# Patient Record
Sex: Female | Born: 1970
Health system: Southern US, Community
[De-identification: ages and names within clinical notes are randomized; demographics above are authoritative.]

## PROBLEM LIST (undated history)

## (undated) DIAGNOSIS — I10 Essential (primary) hypertension: Secondary | ICD-10-CM

## (undated) DIAGNOSIS — Z9049 Acquired absence of other specified parts of digestive tract: Secondary | ICD-10-CM

## (undated) DIAGNOSIS — J45909 Unspecified asthma, uncomplicated: Secondary | ICD-10-CM

## (undated) DIAGNOSIS — D649 Anemia, unspecified: Secondary | ICD-10-CM

## (undated) DIAGNOSIS — J42 Unspecified chronic bronchitis: Secondary | ICD-10-CM

## (undated) DIAGNOSIS — N920 Excessive and frequent menstruation with regular cycle: Secondary | ICD-10-CM

## (undated) DIAGNOSIS — F319 Bipolar disorder, unspecified: Secondary | ICD-10-CM

## (undated) DIAGNOSIS — E119 Type 2 diabetes mellitus without complications: Secondary | ICD-10-CM

## (undated) DIAGNOSIS — E7800 Pure hypercholesterolemia, unspecified: Secondary | ICD-10-CM

## (undated) DIAGNOSIS — G4730 Sleep apnea, unspecified: Secondary | ICD-10-CM

## (undated) DIAGNOSIS — Z9289 Personal history of other medical treatment: Secondary | ICD-10-CM

## (undated) DIAGNOSIS — G589 Mononeuropathy, unspecified: Secondary | ICD-10-CM

## (undated) DIAGNOSIS — R079 Chest pain, unspecified: Secondary | ICD-10-CM

## (undated) DIAGNOSIS — F209 Schizophrenia, unspecified: Secondary | ICD-10-CM

## (undated) DIAGNOSIS — K219 Gastro-esophageal reflux disease without esophagitis: Secondary | ICD-10-CM

## (undated) DIAGNOSIS — G43909 Migraine, unspecified, not intractable, without status migrainosus: Secondary | ICD-10-CM

## (undated) DIAGNOSIS — F32A Depression, unspecified: Secondary | ICD-10-CM

## (undated) DIAGNOSIS — F329 Major depressive disorder, single episode, unspecified: Secondary | ICD-10-CM

## (undated) DIAGNOSIS — J189 Pneumonia, unspecified organism: Secondary | ICD-10-CM

## (undated) DIAGNOSIS — F419 Anxiety disorder, unspecified: Secondary | ICD-10-CM

## (undated) HISTORY — PX: FRACTURE SURGERY: SHX138

## (undated) HISTORY — DX: Chest pain, unspecified: R07.9

## (undated) HISTORY — PX: WISDOM TOOTH EXTRACTION: SHX21

## (undated) HISTORY — PX: TUBAL LIGATION: SHX77

---
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
## 2008-02-29 ENCOUNTER — Emergency Department (HOSPITAL_COMMUNITY): Admission: EM | Admit: 2008-02-29 | Discharge: 2008-02-29 | Payer: Self-pay | Admitting: Emergency Medicine

## 2008-03-18 ENCOUNTER — Emergency Department (HOSPITAL_COMMUNITY): Admission: EM | Admit: 2008-03-18 | Discharge: 2008-03-18 | Payer: Self-pay | Admitting: Emergency Medicine

## 2008-03-21 ENCOUNTER — Ambulatory Visit: Payer: Self-pay | Admitting: Internal Medicine

## 2008-03-21 DIAGNOSIS — F329 Major depressive disorder, single episode, unspecified: Secondary | ICD-10-CM

## 2008-03-21 DIAGNOSIS — J45909 Unspecified asthma, uncomplicated: Secondary | ICD-10-CM

## 2008-03-21 DIAGNOSIS — J42 Unspecified chronic bronchitis: Secondary | ICD-10-CM | POA: Insufficient documentation

## 2008-03-21 DIAGNOSIS — N920 Excessive and frequent menstruation with regular cycle: Secondary | ICD-10-CM

## 2008-03-21 DIAGNOSIS — E669 Obesity, unspecified: Secondary | ICD-10-CM | POA: Insufficient documentation

## 2008-03-21 DIAGNOSIS — E1169 Type 2 diabetes mellitus with other specified complication: Secondary | ICD-10-CM

## 2008-03-21 DIAGNOSIS — F411 Generalized anxiety disorder: Secondary | ICD-10-CM

## 2008-03-21 DIAGNOSIS — F3289 Other specified depressive episodes: Secondary | ICD-10-CM | POA: Insufficient documentation

## 2008-03-21 DIAGNOSIS — F319 Bipolar disorder, unspecified: Secondary | ICD-10-CM | POA: Insufficient documentation

## 2008-03-21 HISTORY — DX: Unspecified chronic bronchitis: J42

## 2008-03-21 HISTORY — DX: Excessive and frequent menstruation with regular cycle: N92.0

## 2008-03-21 HISTORY — DX: Generalized anxiety disorder: F41.1

## 2008-04-05 ENCOUNTER — Ambulatory Visit: Payer: Self-pay | Admitting: Internal Medicine

## 2008-04-05 DIAGNOSIS — G4733 Obstructive sleep apnea (adult) (pediatric): Secondary | ICD-10-CM | POA: Insufficient documentation

## 2008-04-05 DIAGNOSIS — G43909 Migraine, unspecified, not intractable, without status migrainosus: Secondary | ICD-10-CM | POA: Insufficient documentation

## 2008-04-06 ENCOUNTER — Emergency Department (HOSPITAL_COMMUNITY): Admission: EM | Admit: 2008-04-06 | Discharge: 2008-04-06 | Payer: Self-pay | Admitting: Emergency Medicine

## 2008-04-06 ENCOUNTER — Telehealth (INDEPENDENT_AMBULATORY_CARE_PROVIDER_SITE_OTHER): Payer: Self-pay | Admitting: *Deleted

## 2008-04-08 ENCOUNTER — Emergency Department (HOSPITAL_COMMUNITY): Admission: EM | Admit: 2008-04-08 | Discharge: 2008-04-08 | Payer: Self-pay | Admitting: Emergency Medicine

## 2008-04-15 ENCOUNTER — Emergency Department (HOSPITAL_COMMUNITY): Admission: EM | Admit: 2008-04-15 | Discharge: 2008-04-15 | Payer: Self-pay | Admitting: Emergency Medicine

## 2008-04-17 ENCOUNTER — Emergency Department (HOSPITAL_COMMUNITY): Admission: EM | Admit: 2008-04-17 | Discharge: 2008-04-17 | Payer: Self-pay | Admitting: Emergency Medicine

## 2008-04-18 ENCOUNTER — Ambulatory Visit: Payer: Self-pay | Admitting: Internal Medicine

## 2008-04-19 ENCOUNTER — Encounter (INDEPENDENT_AMBULATORY_CARE_PROVIDER_SITE_OTHER): Payer: Self-pay | Admitting: Internal Medicine

## 2008-04-20 ENCOUNTER — Encounter (INDEPENDENT_AMBULATORY_CARE_PROVIDER_SITE_OTHER): Payer: Self-pay | Admitting: Internal Medicine

## 2008-04-26 ENCOUNTER — Encounter (INDEPENDENT_AMBULATORY_CARE_PROVIDER_SITE_OTHER): Payer: Self-pay | Admitting: Internal Medicine

## 2008-05-03 ENCOUNTER — Encounter (INDEPENDENT_AMBULATORY_CARE_PROVIDER_SITE_OTHER): Payer: Self-pay | Admitting: Internal Medicine

## 2008-05-24 ENCOUNTER — Emergency Department (HOSPITAL_COMMUNITY): Admission: EM | Admit: 2008-05-24 | Discharge: 2008-05-24 | Payer: Self-pay | Admitting: Emergency Medicine

## 2008-05-25 ENCOUNTER — Emergency Department (HOSPITAL_COMMUNITY): Admission: EM | Admit: 2008-05-25 | Discharge: 2008-05-25 | Payer: Self-pay | Admitting: Emergency Medicine

## 2008-05-25 ENCOUNTER — Telehealth (INDEPENDENT_AMBULATORY_CARE_PROVIDER_SITE_OTHER): Payer: Self-pay | Admitting: Internal Medicine

## 2008-06-03 ENCOUNTER — Encounter (INDEPENDENT_AMBULATORY_CARE_PROVIDER_SITE_OTHER): Payer: Self-pay | Admitting: Internal Medicine

## 2008-06-10 ENCOUNTER — Emergency Department (HOSPITAL_COMMUNITY): Admission: EM | Admit: 2008-06-10 | Discharge: 2008-06-10 | Payer: Self-pay | Admitting: Emergency Medicine

## 2008-10-17 ENCOUNTER — Emergency Department (HOSPITAL_COMMUNITY): Admission: EM | Admit: 2008-10-17 | Discharge: 2008-10-17 | Payer: Self-pay | Admitting: Emergency Medicine

## 2008-11-06 ENCOUNTER — Emergency Department (HOSPITAL_COMMUNITY): Admission: EM | Admit: 2008-11-06 | Discharge: 2008-11-06 | Payer: Self-pay | Admitting: Emergency Medicine

## 2009-03-24 ENCOUNTER — Encounter (INDEPENDENT_AMBULATORY_CARE_PROVIDER_SITE_OTHER): Payer: Self-pay | Admitting: Internal Medicine

## 2009-03-28 ENCOUNTER — Encounter (INDEPENDENT_AMBULATORY_CARE_PROVIDER_SITE_OTHER): Payer: Self-pay | Admitting: Internal Medicine

## 2009-11-07 ENCOUNTER — Emergency Department (HOSPITAL_COMMUNITY): Admission: EM | Admit: 2009-11-07 | Discharge: 2009-11-07 | Payer: Self-pay | Admitting: Emergency Medicine

## 2009-11-09 ENCOUNTER — Observation Stay (HOSPITAL_COMMUNITY): Admission: EM | Admit: 2009-11-09 | Discharge: 2009-11-10 | Payer: Self-pay | Admitting: Emergency Medicine

## 2010-01-30 ENCOUNTER — Emergency Department (HOSPITAL_COMMUNITY): Admission: EM | Admit: 2010-01-30 | Discharge: 2010-01-30 | Payer: Self-pay | Admitting: Emergency Medicine

## 2010-01-30 ENCOUNTER — Other Ambulatory Visit: Payer: Self-pay | Admitting: Emergency Medicine

## 2010-01-31 ENCOUNTER — Inpatient Hospital Stay (HOSPITAL_COMMUNITY): Admission: EM | Admit: 2010-01-31 | Discharge: 2010-02-05 | Payer: Self-pay | Admitting: Psychiatry

## 2010-01-31 ENCOUNTER — Ambulatory Visit: Payer: Self-pay | Admitting: Psychiatry

## 2010-03-11 ENCOUNTER — Emergency Department (HOSPITAL_COMMUNITY): Admission: EM | Admit: 2010-03-11 | Discharge: 2010-03-11 | Payer: Self-pay | Admitting: Emergency Medicine

## 2010-04-10 ENCOUNTER — Emergency Department (HOSPITAL_COMMUNITY): Admission: EM | Admit: 2010-04-10 | Discharge: 2010-04-11 | Payer: Self-pay | Admitting: Emergency Medicine

## 2010-04-11 ENCOUNTER — Emergency Department (HOSPITAL_COMMUNITY): Admission: EM | Admit: 2010-04-11 | Discharge: 2010-04-12 | Payer: Self-pay | Admitting: Emergency Medicine

## 2010-04-19 ENCOUNTER — Emergency Department (HOSPITAL_COMMUNITY): Admission: EM | Admit: 2010-04-19 | Discharge: 2010-04-19 | Payer: Self-pay | Admitting: Emergency Medicine

## 2010-06-08 ENCOUNTER — Emergency Department (HOSPITAL_COMMUNITY): Admission: EM | Admit: 2010-06-08 | Discharge: 2010-06-08 | Payer: Self-pay | Admitting: Emergency Medicine

## 2010-07-02 ENCOUNTER — Emergency Department (HOSPITAL_COMMUNITY)
Admission: EM | Admit: 2010-07-02 | Discharge: 2010-07-02 | Payer: Self-pay | Source: Home / Self Care | Admitting: Emergency Medicine

## 2010-07-11 ENCOUNTER — Emergency Department (HOSPITAL_COMMUNITY)
Admission: EM | Admit: 2010-07-11 | Discharge: 2010-07-11 | Payer: Self-pay | Source: Home / Self Care | Admitting: Emergency Medicine

## 2010-07-15 ENCOUNTER — Emergency Department (HOSPITAL_COMMUNITY)
Admission: EM | Admit: 2010-07-15 | Discharge: 2010-07-15 | Payer: Self-pay | Source: Home / Self Care | Admitting: Emergency Medicine

## 2010-07-16 ENCOUNTER — Emergency Department (HOSPITAL_COMMUNITY)
Admission: EM | Admit: 2010-07-16 | Discharge: 2010-07-16 | Payer: Self-pay | Source: Home / Self Care | Admitting: Emergency Medicine

## 2010-07-25 ENCOUNTER — Emergency Department (HOSPITAL_COMMUNITY)
Admission: EM | Admit: 2010-07-25 | Discharge: 2010-07-25 | Payer: Self-pay | Source: Home / Self Care | Admitting: Emergency Medicine

## 2010-08-19 ENCOUNTER — Emergency Department (HOSPITAL_COMMUNITY)
Admission: EM | Admit: 2010-08-19 | Discharge: 2010-08-19 | Payer: Self-pay | Source: Home / Self Care | Admitting: Emergency Medicine

## 2010-08-21 LAB — URINALYSIS, ROUTINE W REFLEX MICROSCOPIC
Bilirubin Urine: NEGATIVE
Hgb urine dipstick: NEGATIVE
Ketones, ur: NEGATIVE mg/dL
Nitrite: NEGATIVE
Protein, ur: NEGATIVE mg/dL
Specific Gravity, Urine: 1.027 (ref 1.005–1.030)
Urobilinogen, UA: 0.2 mg/dL (ref 0.0–1.0)

## 2010-10-08 LAB — URINALYSIS, ROUTINE W REFLEX MICROSCOPIC
Glucose, UA: NEGATIVE mg/dL
Ketones, ur: NEGATIVE mg/dL
Urobilinogen, UA: 0.2 mg/dL (ref 0.0–1.0)
pH: 8 (ref 5.0–8.0)

## 2010-10-09 LAB — WET PREP, GENITAL

## 2010-10-09 LAB — URINALYSIS, ROUTINE W REFLEX MICROSCOPIC
Bilirubin Urine: NEGATIVE
Glucose, UA: NEGATIVE mg/dL
Hgb urine dipstick: NEGATIVE
Nitrite: NEGATIVE
pH: 5.5 (ref 5.0–8.0)

## 2010-10-09 LAB — POCT PREGNANCY, URINE: Preg Test, Ur: NEGATIVE

## 2010-10-11 LAB — DIFFERENTIAL
Basophils Absolute: 0 10*3/uL (ref 0.0–0.1)
Basophils Relative: 0 % (ref 0–1)
Eosinophils Absolute: 0 10*3/uL (ref 0.0–0.7)
Eosinophils Relative: 0 % (ref 0–5)
Monocytes Absolute: 0.2 10*3/uL (ref 0.1–1.0)

## 2010-10-11 LAB — URINALYSIS, ROUTINE W REFLEX MICROSCOPIC
Glucose, UA: NEGATIVE mg/dL
Leukocytes, UA: NEGATIVE
Nitrite: NEGATIVE
Protein, ur: 100 mg/dL — AB
Specific Gravity, Urine: 1.031 — ABNORMAL HIGH (ref 1.005–1.030)
Urobilinogen, UA: 0.2 mg/dL (ref 0.0–1.0)

## 2010-10-11 LAB — LIPASE, BLOOD: Lipase: 22 U/L (ref 11–59)

## 2010-10-11 LAB — RAPID URINE DRUG SCREEN, HOSP PERFORMED
Barbiturates: NOT DETECTED
Cocaine: NOT DETECTED
Opiates: NOT DETECTED
Tetrahydrocannabinol: NOT DETECTED

## 2010-10-11 LAB — COMPREHENSIVE METABOLIC PANEL
ALT: 13 U/L (ref 0–35)
Albumin: 4.2 g/dL (ref 3.5–5.2)
Alkaline Phosphatase: 37 U/L — ABNORMAL LOW (ref 39–117)
BUN: 12 mg/dL (ref 6–23)
CO2: 23 mEq/L (ref 19–32)
Calcium: 9.3 mg/dL (ref 8.4–10.5)
Glucose, Bld: 166 mg/dL — ABNORMAL HIGH (ref 70–99)
Potassium: 3.8 mEq/L (ref 3.5–5.1)
Total Protein: 7.8 g/dL (ref 6.0–8.3)

## 2010-10-11 LAB — CBC
HCT: 39.2 % (ref 36.0–46.0)
Hemoglobin: 13.3 g/dL (ref 12.0–15.0)
MCV: 82.1 fL (ref 78.0–100.0)
Platelets: 355 10*3/uL (ref 150–400)
RBC: 4.77 MIL/uL (ref 3.87–5.11)

## 2010-10-11 LAB — WET PREP, GENITAL
Clue Cells Wet Prep HPF POC: NONE SEEN
Trich, Wet Prep: NONE SEEN

## 2010-10-11 LAB — URINE MICROSCOPIC-ADD ON

## 2010-10-14 LAB — CBC
Hemoglobin: 11.5 g/dL — ABNORMAL LOW (ref 12.0–15.0)
MCH: 27.1 pg (ref 26.0–34.0)
RBC: 4.24 MIL/uL (ref 3.87–5.11)

## 2010-10-14 LAB — DIFFERENTIAL
Eosinophils Absolute: 0.1 10*3/uL (ref 0.0–0.7)
Lymphocytes Relative: 35 % (ref 12–46)
Lymphs Abs: 2.3 10*3/uL (ref 0.7–4.0)
Monocytes Relative: 5 % (ref 3–12)
Neutrophils Relative %: 58 % (ref 43–77)

## 2010-10-14 LAB — URINALYSIS, ROUTINE W REFLEX MICROSCOPIC
Bilirubin Urine: NEGATIVE
Specific Gravity, Urine: >1.03 — ABNORMAL HIGH (ref 1.005–1.030)
pH: 6 (ref 5.0–8.0)

## 2010-10-14 LAB — BASIC METABOLIC PANEL
CO2: 24 mEq/L (ref 19–32)
Calcium: 9.2 mg/dL (ref 8.4–10.5)
Chloride: 111 mEq/L (ref 96–112)
GFR calc Af Amer: >60 mL/min (ref >60)
Sodium: 142 mEq/L (ref 135–145)

## 2010-10-14 LAB — URINE MICROSCOPIC-ADD ON

## 2010-10-14 LAB — RAPID URINE DRUG SCREEN, HOSP PERFORMED
Amphetamines: NOT DETECTED
Barbiturates: NOT DETECTED

## 2010-10-16 LAB — BASIC METABOLIC PANEL
CO2: 18 mEq/L — ABNORMAL LOW (ref 19–32)
Calcium: 8 mg/dL — ABNORMAL LOW (ref 8.4–10.5)
Chloride: 120 mEq/L — ABNORMAL HIGH (ref 96–112)
Glucose, Bld: 125 mg/dL — ABNORMAL HIGH (ref 70–99)
Potassium: 3.3 mEq/L — ABNORMAL LOW (ref 3.5–5.1)
Sodium: 142 mEq/L (ref 135–145)

## 2010-10-17 LAB — CLOSTRIDIUM DIFFICILE EIA: C difficile Toxins A+B, EIA: NEGATIVE

## 2010-10-17 LAB — COMPREHENSIVE METABOLIC PANEL
AST: 24 U/L (ref 0–37)
Albumin: 4 g/dL (ref 3.5–5.2)
BUN: 4 mg/dL — ABNORMAL LOW (ref 6–23)
BUN: 8 mg/dL (ref 6–23)
Calcium: 9 mg/dL (ref 8.4–10.5)
Calcium: 9.3 mg/dL (ref 8.4–10.5)
Chloride: 112 mEq/L (ref 96–112)
Creatinine, Ser: 1.28 mg/dL — ABNORMAL HIGH (ref 0.4–1.2)
Creatinine, Ser: 1.7 mg/dL — ABNORMAL HIGH (ref 0.4–1.2)
GFR calc Af Amer: 40 mL/min — ABNORMAL LOW (ref >60)
GFR calc non Af Amer: 46 mL/min — ABNORMAL LOW (ref >60)
Glucose, Bld: 110 mg/dL — ABNORMAL HIGH (ref 70–99)
Total Bilirubin: 0.6 mg/dL (ref 0.3–1.2)
Total Protein: 7.3 g/dL (ref 6.0–8.3)
Total Protein: 7.9 g/dL (ref 6.0–8.3)

## 2010-10-17 LAB — DIFFERENTIAL
Basophils Absolute: 0 10*3/uL (ref 0.0–0.1)
Basophils Relative: 0 % (ref 0–1)
Eosinophils Relative: 1 % (ref 0–5)
Lymphocytes Relative: 13 % (ref 12–46)
Lymphs Abs: 0.9 10*3/uL (ref 0.7–4.0)
Lymphs Abs: 1.2 10*3/uL (ref 0.7–4.0)
Monocytes Absolute: 0.1 10*3/uL (ref 0.1–1.0)
Monocytes Relative: 2 % — ABNORMAL LOW (ref 3–12)
Monocytes Relative: 6 % (ref 3–12)
Neutro Abs: 5.6 10*3/uL (ref 1.7–7.7)
Neutro Abs: 8.4 10*3/uL — ABNORMAL HIGH (ref 1.7–7.7)
Neutrophils Relative %: 81 % — ABNORMAL HIGH (ref 43–77)

## 2010-10-17 LAB — TSH: TSH: 1.442 u[IU]/mL (ref 0.350–4.500)

## 2010-10-17 LAB — LIPASE, BLOOD: Lipase: 25 U/L (ref 11–59)

## 2010-10-17 LAB — URINALYSIS, ROUTINE W REFLEX MICROSCOPIC
Glucose, UA: NEGATIVE mg/dL
Glucose, UA: NEGATIVE mg/dL
Ketones, ur: NEGATIVE mg/dL
Leukocytes, UA: NEGATIVE
Leukocytes, UA: NEGATIVE
Nitrite: NEGATIVE
Nitrite: NEGATIVE
Specific Gravity, Urine: 1.03 (ref 1.005–1.030)
pH: 5 (ref 5.0–8.0)
pH: 5.5 (ref 5.0–8.0)

## 2010-10-17 LAB — CBC
HCT: 37.2 % (ref 36.0–46.0)
HCT: 40.5 % (ref 36.0–46.0)
Hemoglobin: 13.5 g/dL (ref 12.0–15.0)
MCHC: 33.4 g/dL (ref 30.0–36.0)
MCV: 82.6 fL (ref 78.0–100.0)
MCV: 83 fL (ref 78.0–100.0)
Platelets: 273 10*3/uL (ref 150–400)
RDW: 14.5 % (ref 11.5–15.5)
RDW: 14.7 % (ref 11.5–15.5)
WBC: 6.7 10*3/uL (ref 4.0–10.5)

## 2010-10-17 LAB — URINE MICROSCOPIC-ADD ON

## 2010-10-17 LAB — STOOL CULTURE

## 2010-10-17 LAB — RAPID URINE DRUG SCREEN, HOSP PERFORMED
Amphetamines: NOT DETECTED
Barbiturates: NOT DETECTED
Opiates: POSITIVE — AB

## 2011-04-26 LAB — URINALYSIS, ROUTINE W REFLEX MICROSCOPIC
Glucose, UA: NEGATIVE
Glucose, UA: NEGATIVE
Leukocytes, UA: NEGATIVE
Leukocytes, UA: NEGATIVE
Specific Gravity, Urine: >1.03 — ABNORMAL HIGH
pH: 5.5
pH: 5.5

## 2011-04-26 LAB — CBC
Hemoglobin: 12.2
MCHC: 32.7
MCHC: 32.8
Platelets: 317
RBC: 4.38
RDW: 13.9
WBC: 8.6

## 2011-04-26 LAB — URINE MICROSCOPIC-ADD ON

## 2011-04-26 LAB — COMPREHENSIVE METABOLIC PANEL
ALT: 12
Albumin: 4.2
Alkaline Phosphatase: 38 — ABNORMAL LOW
BUN: 10
Calcium: 9.3
Potassium: 3 — ABNORMAL LOW
Sodium: 141
Total Protein: 7.5

## 2011-04-26 LAB — DIFFERENTIAL
Basophils Relative: 0
Basophils Relative: 0
Lymphocytes Relative: 26
Lymphs Abs: 1.6
Lymphs Abs: 2.3
Monocytes Absolute: 0.5
Monocytes Absolute: 0.6
Monocytes Relative: 6
Monocytes Relative: 7
Neutro Abs: 5.8
Neutro Abs: 5.9
Neutrophils Relative %: 67
Neutrophils Relative %: 73

## 2011-04-26 LAB — PREGNANCY, URINE: Preg Test, Ur: NEGATIVE

## 2011-05-01 LAB — DIFFERENTIAL
Lymphs Abs: 1.5
Monocytes Absolute: 0.4
Monocytes Relative: 7
Neutro Abs: 4.5
Neutrophils Relative %: 69

## 2011-05-01 LAB — URINALYSIS, ROUTINE W REFLEX MICROSCOPIC
Glucose, UA: NEGATIVE
Hgb urine dipstick: NEGATIVE
Protein, ur: NEGATIVE
Specific Gravity, Urine: 1.02

## 2011-05-01 LAB — BASIC METABOLIC PANEL
Calcium: 9.1
Chloride: 112
Creatinine, Ser: 1.01
GFR calc Af Amer: >60
Sodium: 141

## 2011-05-01 LAB — CBC
Hemoglobin: 11.9 — ABNORMAL LOW
MCV: 84.8
RBC: 4.27
WBC: 6.6

## 2012-06-16 ENCOUNTER — Emergency Department (HOSPITAL_COMMUNITY)
Admission: EM | Admit: 2012-06-16 | Discharge: 2012-06-16 | Disposition: A | Discharge status: Home / Self Care | Payer: Self-pay | Attending: Emergency Medicine | Admitting: Emergency Medicine

## 2012-06-16 ENCOUNTER — Encounter (HOSPITAL_COMMUNITY): Payer: Self-pay | Admitting: Emergency Medicine

## 2012-06-16 DIAGNOSIS — R112 Nausea with vomiting, unspecified: Secondary | ICD-10-CM | POA: Insufficient documentation

## 2012-06-16 DIAGNOSIS — J45909 Unspecified asthma, uncomplicated: Secondary | ICD-10-CM | POA: Insufficient documentation

## 2012-06-16 DIAGNOSIS — G43909 Migraine, unspecified, not intractable, without status migrainosus: Secondary | ICD-10-CM | POA: Insufficient documentation

## 2012-06-16 DIAGNOSIS — Z8679 Personal history of other diseases of the circulatory system: Secondary | ICD-10-CM | POA: Insufficient documentation

## 2012-06-16 HISTORY — DX: Unspecified asthma, uncomplicated: J45.909

## 2012-06-16 HISTORY — DX: Migraine, unspecified, not intractable, without status migrainosus: G43.909

## 2012-06-16 MED ORDER — ONDANSETRON HCL 4 MG PO TABS: 4.0000 mg | ORAL_TABLET | Freq: Four times a day (QID) | ORAL | Status: DC

## 2012-06-16 MED ORDER — TRAMADOL HCL 50 MG PO TABS: 50.0000 mg | ORAL_TABLET | Freq: Four times a day (QID) | ORAL | Status: DC | PRN

## 2012-06-16 MED ORDER — KETOROLAC TROMETHAMINE 30 MG/ML IJ SOLN
30.0000 mg | Freq: Once | INTRAMUSCULAR | Status: AC
  Administered 2012-06-16: 30 mg via INTRAVENOUS
  Filled 2012-06-16: qty 1

## 2012-06-16 MED ORDER — METOCLOPRAMIDE HCL 5 MG/ML IJ SOLN
10.0000 mg | Freq: Once | INTRAMUSCULAR | Status: AC
  Administered 2012-06-16: 10 mg via INTRAVENOUS
  Filled 2012-06-16: qty 2

## 2012-06-16 MED ORDER — ONDANSETRON HCL 4 MG/2ML IJ SOLN
4.0000 mg | Freq: Once | INTRAMUSCULAR | Status: AC
  Administered 2012-06-16: 4 mg via INTRAVENOUS
  Filled 2012-06-16: qty 2

## 2012-06-16 MED ORDER — MORPHINE SULFATE 4 MG/ML IJ SOLN
4.0000 mg | Freq: Once | INTRAMUSCULAR | Status: AC
Start: 2012-06-16 — End: 2012-06-16
  Administered 2012-06-16: 4 mg via INTRAVENOUS
  Filled 2012-06-16: qty 1

## 2012-06-16 MED ORDER — DIPHENHYDRAMINE HCL 50 MG/ML IJ SOLN
25.0000 mg | Freq: Once | INTRAMUSCULAR | Status: AC
  Administered 2012-06-16: 25 mg via INTRAVENOUS
  Filled 2012-06-16: qty 1

## 2012-06-16 MED ORDER — MORPHINE SULFATE 4 MG/ML IJ SOLN
4.0000 mg | Freq: Once | INTRAMUSCULAR | Status: AC
  Administered 2012-06-16: 4 mg via INTRAVENOUS
  Filled 2012-06-16: qty 1

## 2012-06-16 MED ORDER — SODIUM CHLORIDE 0.9 % IV BOLUS (SEPSIS)
1000.0000 mL | Freq: Once | INTRAVENOUS | Status: AC
  Administered 2012-06-16: 1000 mL via INTRAVENOUS

## 2012-06-16 NOTE — ED Provider Notes (Signed)
History     CSN: [id number]  Arrival date & time 06/16/12  1453   First MD Initiated Contact with Patient 06/16/12 1534      Chief Complaint  Patient presents with  . Headache    (Consider location/radiation/quality/duration/timing/severity/associated sxs/prior treatment) HPI  As the emergency department with complaints of migraine lasting for 2 weeks. She says that she has a history of migraines the last time she was seen in the ER was over a month ago. She informs me that the headache cocktail does not work and that injections do not work either. She states that what works for her is IV morphine. Eyes having any weakness,, diarrhea, neck pain, change in vision. Any fevers or chest pains/SOB. nad vss Pt saw a "head specialist" years ago who gave her a "liquid preventative medication" but that was when she lived in another place and she has had no care for her migraines in a long time because she is on the waiting list for insurance.   Past Medical History  Diagnosis Date  . Migraines   . Asthma     No past surgical history on file.  No family history on file.  History  Substance Use Topics  . Smoking status: Never Smoker   . Smokeless tobacco: Not on file  . Alcohol Use: No    OB History    Grav Para Term Preterm Abortions TAB SAB Ect Mult Living                  Review of Systems  Review of Systems  Gen: no weight loss, fevers, chills, night sweats  Eyes: no discharge or drainage, no occular pain or visual changes  Nose: no epistaxis or rhinorrhea  Mouth: no dental pain, no sore throat  Neck: no neck pain  Lungs:No wheezing, coughing or hemoptysis CV: no chest pain, palpitations, dependent edema or orthopnea  Abd: no abdominal pain, nausea, vomiting  GU: no dysuria or gross hematuria  MSK:  No abnormalities  Neuro: + headache, no focal neurologic deficits  Skin: no abnormalities Psyche: negative.   Allergies  Review of patient's allergies indicates no  known allergies.  Home Medications   Current Outpatient Rx  Name  Route  Sig  Dispense  Refill  . CITALOPRAM HYDROBROMIDE 20 MG PO TABS   Oral   Take 20 mg by mouth daily.         Marland Kitchen HYDROXYZINE PAMOATE 50 MG PO CAPS   Oral   Take 50 mg by mouth 3 (three) times daily as needed. Anxiety         . ONDANSETRON HCL 4 MG PO TABS   Oral   Take 1 tablet (4 mg total) by mouth every 6 (six) hours.   12 tablet   0   . TRAMADOL HCL 50 MG PO TABS   Oral   Take 1 tablet (50 mg total) by mouth every 6 (six) hours as needed for pain.   15 tablet   0     BP 133/79  Pulse 100  Temp 98.6 F (37 C) (Oral)  Resp 18  SpO2 99%  LMP 06/10/2012  Physical Exam  Nursing note and vitals reviewed. Constitutional: She is oriented to person, place, and time. She appears well-developed and well-nourished. No distress.  HENT:  Head: Normocephalic and atraumatic.  Eyes: Pupils are equal, round, and reactive to light.  Neck: Normal range of motion. Neck supple.  Cardiovascular: Normal rate and regular rhythm.  Pulmonary/Chest: Effort normal.  Abdominal: Soft.  Neurological: She is alert and oriented to person, place, and time. She has normal strength. No cranial nerve deficit or sensory deficit. She displays a negative Romberg sign.  Skin: Skin is warm and dry.    ED Course  Procedures (including critical care time)  Labs Reviewed - No data to display No results found.   1. Migraine       MDM  Pt given Saline 1 L, benadryl, toradol, decadron. Her headache only partial resolved. 4mg  IV Morphine given and patients headache resolved to a 3/10. I more round of Morphine given and the patient had complete resolution of her pain. Will Rx for Ultram and give referral to Headache clinic or Neurologist.   Pt has been advised of the symptoms that warrant their return to the ED. Patient has voiced understanding and has agreed to follow-up with the PCP or specialist.     Linus Mako,  [place] 06/16/12 1810

## 2012-06-16 NOTE — ED Notes (Signed)
Patient given discharge instructions, information, prescriptions, and diet order. Patient states that they adequately understand discharge information given and to return to ED if symptoms return or worsen.     

## 2012-06-16 NOTE — ED Notes (Signed)
Pt c/o headache.  States she gets migraines and has had this one for 2 weeks.  Has had NV since last night.  States that the "headache cocktail" doesn't work and that she needs an IV and morphine.

## 2012-06-16 NOTE — ED Notes (Signed)
Pt sts she has chronic headaches with photophobia nausea and vomiting. Sts her headache began 2 weeks ago. Sts that she was prescribed percocet and vicodin last time she was seen here for her migraines, sts it helped her pain. Patient c/o left sided head pain that radiates to her eye.

## 2012-06-16 NOTE — ED Provider Notes (Signed)
Medical screening examination/treatment/procedure(s) were performed by non-physician practitioner and as supervising physician I was immediately available for consultation/collaboration.   Kathalene Frames, MD 06/16/12 [phone]

## 2012-06-17 ENCOUNTER — Encounter (HOSPITAL_COMMUNITY): Payer: Self-pay

## 2012-06-17 ENCOUNTER — Emergency Department (HOSPITAL_COMMUNITY)
Admission: EM | Admit: 2012-06-17 | Discharge: 2012-06-17 | Disposition: A | Discharge status: Home / Self Care | Payer: Self-pay | Attending: Emergency Medicine | Admitting: Emergency Medicine

## 2012-06-17 DIAGNOSIS — R51 Headache: Secondary | ICD-10-CM | POA: Insufficient documentation

## 2012-06-17 DIAGNOSIS — H53149 Visual discomfort, unspecified: Secondary | ICD-10-CM | POA: Insufficient documentation

## 2012-06-17 DIAGNOSIS — Z79899 Other long term (current) drug therapy: Secondary | ICD-10-CM | POA: Insufficient documentation

## 2012-06-17 DIAGNOSIS — R1110 Vomiting, unspecified: Secondary | ICD-10-CM | POA: Insufficient documentation

## 2012-06-17 DIAGNOSIS — J45909 Unspecified asthma, uncomplicated: Secondary | ICD-10-CM | POA: Insufficient documentation

## 2012-06-17 MED ORDER — SUMATRIPTAN SUCCINATE 6 MG/0.5ML ~~LOC~~ SOLN
6.0000 mg | Freq: Once | SUBCUTANEOUS | Status: AC
  Administered 2012-06-17: 6 mg via SUBCUTANEOUS
  Filled 2012-06-17: qty 0.5

## 2012-06-17 MED ORDER — VALPROATE SODIUM 500 MG/5ML IV SOLN
500.0000 mg | Freq: Once | INTRAVENOUS | Status: DC
  Filled 2012-06-17: qty 5

## 2012-06-17 MED ORDER — HYDROMORPHONE HCL PF 1 MG/ML IJ SOLN
1.0000 mg | Freq: Once | INTRAMUSCULAR | Status: AC
  Administered 2012-06-17: 1 mg via INTRAVENOUS
  Filled 2012-06-17: qty 1

## 2012-06-17 MED ORDER — METOCLOPRAMIDE HCL 5 MG/ML IJ SOLN
10.0000 mg | Freq: Once | INTRAMUSCULAR | Status: AC
  Administered 2012-06-17: 10 mg via INTRAVENOUS
  Filled 2012-06-17: qty 2

## 2012-06-17 MED ORDER — SODIUM CHLORIDE 0.9 % IV BOLUS (SEPSIS)
1000.0000 mL | Freq: Once | INTRAVENOUS | Status: AC
  Administered 2012-06-17: 1000 mL via INTRAVENOUS

## 2012-06-17 MED ORDER — DEXAMETHASONE SODIUM PHOSPHATE 10 MG/ML IJ SOLN
10.0000 mg | Freq: Once | INTRAMUSCULAR | Status: AC
  Administered 2012-06-17: 10 mg via INTRAVENOUS
  Filled 2012-06-17: qty 1

## 2012-06-17 MED ORDER — RIZATRIPTAN BENZOATE 10 MG PO TABS: 10.0000 mg | ORAL_TABLET | ORAL | Status: DC | PRN

## 2012-06-17 MED ORDER — PROMETHAZINE HCL 25 MG PO TABS: 25.0000 mg | ORAL_TABLET | Freq: Four times a day (QID) | ORAL | Status: DC | PRN

## 2012-06-17 MED ORDER — DIPHENHYDRAMINE HCL 50 MG/ML IJ SOLN
25.0000 mg | Freq: Once | INTRAMUSCULAR | Status: AC
  Administered 2012-06-17: 25 mg via INTRAVENOUS
  Filled 2012-06-17: qty 1

## 2012-06-17 NOTE — ED Notes (Signed)
Pt reports h/a is still a 10/10 but nausea is relieved.

## 2012-06-17 NOTE — ED Notes (Signed)
Pt ambulated to the BR with fiance's assist.  Pt with steady gait.  Pt c/o nausea and h/a.

## 2012-06-17 NOTE — ED Notes (Signed)
Patricia [name] EDPA notified re pt's memory loss per pt and her fiance.

## 2012-06-17 NOTE — ED Provider Notes (Signed)
History     CSN: [id number]  Arrival date & time 06/17/12  1042   First MD Initiated Contact with Patient 06/17/12 1235      Chief Complaint  Patient presents with  . Migraine  . Emesis    (Consider location/radiation/quality/duration/timing/severity/associated sxs/prior treatment) HPI Comments: Patricia [name] presents with her fiancee for evaluation of a headache.  She was seen here in the ER yesterday(last night) for the same headache.  She states the headache was almost completely resolved prior to leaving the ER but has since returned.  It is associated with nausea, blurred vision, and light sensitivity.  She denies any fever, neck pain, or respiratory complaints.  She describes the pain as being on the left side of her forehead and behind the left eye similar to previous headaches.  Patient is a 41 y.o. female presenting with migraines and vomiting. The history is provided by the patient. No language interpreter was used.  Migraine This is a recurrent problem. The current episode started 3 to 5 hours ago. The problem occurs constantly. The problem has been gradually worsening. Associated symptoms include headaches. Pertinent negatives include no chest pain, no abdominal pain and no shortness of breath. The symptoms are aggravated by bending and standing. Nothing relieves the symptoms.  Emesis  Associated symptoms include headaches. Pertinent negatives include no abdominal pain, no arthralgias, no chills, no cough, no diarrhea and no fever.    Past Medical History  Diagnosis Date  . Migraines   . Asthma     No past surgical history on file.  No family history on file.  History  Substance Use Topics  . Smoking status: Never Smoker   . Smokeless tobacco: Not on file  . Alcohol Use: No    OB History    Grav Para Term Preterm Abortions TAB SAB Ect Mult Living                  Review of Systems  Constitutional: Positive for appetite change. Negative for fever, chills,  diaphoresis, activity change and fatigue.  HENT: Negative for congestion, sore throat, facial swelling, rhinorrhea, drooling, trouble swallowing, neck pain, sinus pressure and tinnitus.   Eyes: Positive for photophobia. Negative for pain, discharge, redness, itching and visual disturbance.  Respiratory: Negative for cough, choking, chest tightness and shortness of breath.   Cardiovascular: Negative for chest pain and palpitations.  Gastrointestinal: Positive for vomiting. Negative for nausea, abdominal pain, diarrhea and constipation.  Genitourinary: Negative.   Musculoskeletal: Negative for arthralgias and gait problem.  Skin: Negative for color change, rash and wound.  Neurological: Positive for headaches. Negative for syncope, weakness and light-headedness.  Psychiatric/Behavioral: Negative for agitation.  All other systems reviewed and are negative.    Allergies  Review of patient's allergies indicates no known allergies.  Home Medications   Current Outpatient Rx  Name  Route  Sig  Dispense  Refill  . CITALOPRAM HYDROBROMIDE 20 MG PO TABS   Oral   Take 20 mg by mouth daily.         Marland Kitchen HYDROXYZINE PAMOATE 50 MG PO CAPS   Oral   Take 50 mg by mouth 3 (three) times daily as needed. Anxiety         . ONDANSETRON HCL 4 MG PO TABS   Oral   Take 1 tablet (4 mg total) by mouth every 6 (six) hours.   12 tablet   0   . TRAMADOL HCL 50 MG PO TABS   Oral  Take 1 tablet (50 mg total) by mouth every 6 (six) hours as needed for pain.   15 tablet   0     BP 141/87  Pulse 65  Temp 97.6 F (36.4 C) (Oral)  Resp 18  SpO2 100%  LMP 06/10/2012  Physical Exam  Nursing note and vitals reviewed. Constitutional: She is oriented to person, place, and time. She appears well-developed and well-nourished. No distress.  HENT:  Head: Normocephalic and atraumatic.  Right Ear: External ear normal.  Left Ear: External ear normal.  Nose: Nose normal.  Mouth/Throat: Oropharynx is  clear and moist. No oropharyngeal exudate.  Eyes: Conjunctivae normal are normal. Pupils are equal, round, and reactive to light. Right eye exhibits no discharge. Left eye exhibits no discharge. No scleral icterus.  Neck: Normal range of motion. Neck supple. No JVD present. No tracheal deviation present. No thyromegaly present.  Cardiovascular: Normal rate, regular rhythm, normal heart sounds and intact distal pulses.  Exam reveals no gallop and no friction rub.   No murmur heard. Pulmonary/Chest: Effort normal and breath sounds normal. No stridor. No respiratory distress. She has no wheezes. She has no rales. She exhibits no tenderness.  Abdominal: Soft. Bowel sounds are normal. She exhibits no distension and no mass. There is no tenderness. There is no rebound and no guarding.  Musculoskeletal: Normal range of motion. She exhibits no edema and no tenderness.  Lymphadenopathy:    She has no cervical adenopathy.  Neurological: She is alert and oriented to person, place, and time. No cranial nerve deficit.  Skin: Skin is warm and dry. No rash noted. She is not diaphoretic. No erythema. No pallor.  Psychiatric: She has a normal mood and affect. Her behavior is normal.    ED Course  Procedures (including critical care time)  Labs Reviewed - No data to display No results found.   No diagnosis found.    MDM  Pt presents for evaluation of a headache.  She was seen here in the ER last night for the same headache and reports the symptoms worsened prompting herto return. She describes along hx of migraine headaches.  She currently appears uncomfortable but nontoxic, note stable VS, NAD.  Plan symptomatic care as she reports this headache is consistent with previous headaches.  She reports having recent imaging at [facility].  Will reassess after medications.  1610.  Pt had no improvement of the headache after reglan, benadryl, and decadron.  Administered IVF, sumatriptan, and dilaudid.   She reports resolution of the headache.  Plan discharge home to follow-up as an outpt.  Pt advised to return if the headache worsens for likely admission and consult to neurology.      Perlie Mayo, MD 06/17/12 [phone]

## 2012-06-17 NOTE — ED Notes (Signed)
Per EMS pt here yesterday for same, didn't get her rx's filled. Pt c/o of same with vomiting, zofran given, IV 20g to lt forearm.

## 2012-06-21 ENCOUNTER — Emergency Department (HOSPITAL_COMMUNITY)
Admission: EM | Admit: 2012-06-21 | Discharge: 2012-06-21 | Disposition: A | Discharge status: Home / Self Care | Payer: Self-pay | Attending: Emergency Medicine | Admitting: Emergency Medicine

## 2012-06-21 ENCOUNTER — Emergency Department (HOSPITAL_COMMUNITY): Payer: Self-pay

## 2012-06-21 ENCOUNTER — Encounter (HOSPITAL_COMMUNITY): Payer: Self-pay | Admitting: Emergency Medicine

## 2012-06-21 DIAGNOSIS — J45909 Unspecified asthma, uncomplicated: Secondary | ICD-10-CM | POA: Insufficient documentation

## 2012-06-21 DIAGNOSIS — H53149 Visual discomfort, unspecified: Secondary | ICD-10-CM | POA: Insufficient documentation

## 2012-06-21 DIAGNOSIS — R51 Headache: Secondary | ICD-10-CM | POA: Insufficient documentation

## 2012-06-21 DIAGNOSIS — R5383 Other fatigue: Secondary | ICD-10-CM | POA: Insufficient documentation

## 2012-06-21 DIAGNOSIS — R112 Nausea with vomiting, unspecified: Secondary | ICD-10-CM | POA: Insufficient documentation

## 2012-06-21 DIAGNOSIS — G43909 Migraine, unspecified, not intractable, without status migrainosus: Secondary | ICD-10-CM | POA: Insufficient documentation

## 2012-06-21 DIAGNOSIS — Z79899 Other long term (current) drug therapy: Secondary | ICD-10-CM | POA: Insufficient documentation

## 2012-06-21 DIAGNOSIS — R5381 Other malaise: Secondary | ICD-10-CM | POA: Insufficient documentation

## 2012-06-21 MED ORDER — PROMETHAZINE HCL 25 MG/ML IJ SOLN
25.0000 mg | Freq: Once | INTRAMUSCULAR | Status: AC
  Administered 2012-06-21: 25 mg via INTRAVENOUS
  Filled 2012-06-21: qty 1

## 2012-06-21 MED ORDER — SODIUM CHLORIDE 0.9 % IV BOLUS (SEPSIS)
1000.0000 mL | Freq: Once | INTRAVENOUS | Status: AC
  Administered 2012-06-21: 1000 mL via INTRAVENOUS

## 2012-06-21 MED ORDER — ONDANSETRON HCL 4 MG PO TABS: 4.0000 mg | ORAL_TABLET | Freq: Four times a day (QID) | ORAL | Status: DC

## 2012-06-21 MED ORDER — KETOROLAC TROMETHAMINE 30 MG/ML IJ SOLN
30.0000 mg | Freq: Once | INTRAMUSCULAR | Status: AC
  Administered 2012-06-21: 30 mg via INTRAVENOUS
  Filled 2012-06-21: qty 1

## 2012-06-21 MED ORDER — ONDANSETRON HCL 4 MG/2ML IJ SOLN
4.0000 mg | Freq: Once | INTRAMUSCULAR | Status: AC
  Administered 2012-06-21: 4 mg via INTRAVENOUS
  Filled 2012-06-21: qty 2

## 2012-06-21 MED ORDER — OXYCODONE-ACETAMINOPHEN 5-325 MG PO TABS: 1.0000 | ORAL_TABLET | Freq: Four times a day (QID) | ORAL | Status: DC | PRN

## 2012-06-21 MED ORDER — HYDROMORPHONE HCL PF 1 MG/ML IJ SOLN
1.0000 mg | Freq: Once | INTRAMUSCULAR | Status: AC
  Administered 2012-06-21: 1 mg via INTRAVENOUS
  Filled 2012-06-21: qty 1

## 2012-06-21 MED ORDER — SUMATRIPTAN SUCCINATE 6 MG/0.5ML ~~LOC~~ SOLN
6.0000 mg | Freq: Once | SUBCUTANEOUS | Status: AC
  Administered 2012-06-21: 6 mg via SUBCUTANEOUS
  Filled 2012-06-21: qty 0.5

## 2012-06-21 MED ORDER — DEXAMETHASONE SODIUM PHOSPHATE 10 MG/ML IJ SOLN
10.0000 mg | Freq: Once | INTRAMUSCULAR | Status: AC
  Administered 2012-06-21: 10 mg via INTRAMUSCULAR
  Filled 2012-06-21: qty 1

## 2012-06-21 NOTE — ED Provider Notes (Signed)
History     CSN: [id number]  Arrival date & time 06/21/12  1520   First MD Initiated Contact with Patient 06/21/12 1554      Chief Complaint  Patient presents with  . Migraine    (Consider location/radiation/quality/duration/timing/severity/associated sxs/prior treatment) HPI Comments: 41 year old female with a history of migraines presents to the emergency department with worsening migraine after being seen in the emergency department on November 20. Her headache is located on the left side behind her eye and in the front of her forehead, described as sharp and constant, rated 10 out of 10. Nothing in specific makes her pain better. She had a CT scan at [facility] a month ago when she was living in [facility] which was normal. Her headaches went away for the past month until 2 days ago. Admits to associated nausea, vomiting, photophobia and spots in her fields of vision. Denies blurred vision or double vision. States she's beginning to feel weak. After leaving the emergency department on the 20th, she was better for a few hours until her headache returned. He family also tells her she gets confused whenever she has a severe headache. She is denying any confusion at this time.  The history is provided by the patient.    Past Medical History  Diagnosis Date  . Migraines   . Asthma     No past surgical history on file.  No family history on file.  History  Substance Use Topics  . Smoking status: Never Smoker   . Smokeless tobacco: Not on file  . Alcohol Use: No    OB History    Grav Para Term Preterm Abortions TAB SAB Ect Mult Living                  Review of Systems  Constitutional: Negative for fever and chills.  HENT: Negative for neck pain and neck stiffness.   Eyes: Positive for photophobia and visual disturbance.  Respiratory: Negative for shortness of breath.   Cardiovascular: Negative for chest pain.  Gastrointestinal: Positive for nausea and  vomiting.  Genitourinary: Negative.   Musculoskeletal: Negative for back pain.  Skin: Negative for rash.  Neurological: Positive for weakness and headaches. Negative for speech difficulty.  Psychiatric/Behavioral: Negative for confusion.    Allergies  Review of patient's allergies indicates no known allergies.  Home Medications   Current Outpatient Rx  Name  Route  Sig  Dispense  Refill  . CITALOPRAM HYDROBROMIDE 20 MG PO TABS   Oral   Take 20 mg by mouth daily.         Marland Kitchen HYDROXYZINE PAMOATE 50 MG PO CAPS   Oral   Take 50 mg by mouth 3 (three) times daily as needed. Anxiety         . ONDANSETRON HCL 4 MG PO TABS   Oral   Take 1 tablet (4 mg total) by mouth every 6 (six) hours.   12 tablet   0   . PROMETHAZINE HCL 25 MG PO TABS   Oral   Take 1 tablet (25 mg total) by mouth every 6 (six) hours as needed for nausea.   12 tablet   0   . RIZATRIPTAN BENZOATE 10 MG PO TABS   Oral   Take 1 tablet (10 mg total) by mouth as needed for migraine. May repeat in 2 hours if needed   10 tablet   0   . TRAMADOL HCL 50 MG PO TABS   Oral  Take 1 tablet (50 mg total) by mouth every 6 (six) hours as needed for pain.   15 tablet   0     BP 140/93  Pulse 72  Temp 97.6 F (36.4 C) (Oral)  Resp 24  SpO2 100%  LMP 06/10/2012  Physical Exam  Constitutional: She is oriented to person, place, and time. She appears well-developed. No distress.       Overweight, appears anxious, lights off in room.  HENT:  Head: Normocephalic and atraumatic.  Mouth/Throat: Oropharynx is clear and moist.  Eyes: Conjunctivae normal and EOM are normal. Pupils are equal, round, and reactive to light.  Neck: Normal range of motion. Neck supple.  Cardiovascular: Normal rate, regular rhythm, normal heart sounds and intact distal pulses.   Pulmonary/Chest: Effort normal and breath sounds normal. No respiratory distress.  Abdominal: Soft. Bowel sounds are normal. She exhibits no distension.    Musculoskeletal: Normal range of motion.  Neurological: She is alert and oriented to person, place, and time. A sensory deficit (decreased sensation to light touch on left) is present. Coordination and gait normal.       Strength left upper extrem 3/5, right upper extrem 4/5.   Skin: Skin is warm and dry.  Psychiatric: Her speech is normal and behavior is normal. Thought content normal. Her mood appears anxious. Cognition and memory are normal.       Tearful    ED Course  Procedures (including critical care time)  Labs Reviewed - No data to display Mr Brain Wo Contrast  06/21/2012  *RADIOLOGY REPORT*  Clinical Data: 41 year old female with headache, light and 2-D, vomiting.  MRI HEAD WITHOUT CONTRAST  Technique:  Multiplanar, multiecho pulse sequences of the brain and surrounding structures were obtained according to standard protocol without intravenous contrast.  Comparison: Head CT without contrast 09/11/2009.  Findings: Normal cerebral volume. No restricted diffusion to suggest acute infarction.  No midline shift, mass effect, evidence of mass lesion, ventriculomegaly, extra-axial collection or acute intracranial hemorrhage.  Cervicomedullary junction and pituitary are within normal limits.  Major intracranial vascular flow voids are preserved.  No cortical encephalomalacia.  Occasional small areas of subcortical increased T2 and FLAIR hyperintensity, the degree of which is within normal limits for age.  Otherwise normal gray and white matter signal throughout the brain.  Negative visualized cervical spine.  Dural calcifications again noted. Visualized bone marrow signal is within normal limits.  Visualized orbit soft tissues are within normal limits.  Visualized paranasal sinuses and mastoids are clear.  Small area of right vertex chronic scalp scarring.  Otherwise negative scalp soft tissues.  IMPRESSION: Normal for age noncontrast MRI appearance of the brain.   Original Report Authenticated  By: Roselyn Reef, M.D.      1. Migraine   2. Nausea & vomiting       MDM  41 y/o female with worsening migraine since being seen on 11/20. Slight left sided weakness noted on exam. Decreased sensation to light touch on left. Appears uncomfortable. She is nervous and tearful. CT scan obtained at [facility] was negative. Obtaining MRI brain. 7:07 PM MRI brain without any abnormality. Patient feeling better after receiving pain and nausea medication along with fluids. I explained the diagnosis and have given explicit precautions to return to the ER including fever, blurred vision, confusion, speech difficulty, difficulty walking or any other new or worsening symptoms. The patient understands and accepts the medical plan as it's been dictated and I have answered their questions.  Discharge instructions concerning home care and prescriptions have been given.  The patient is STABLE and is discharged to home in good condition. Vitals unremarkable. Advised neurology follow up for migraine control. Discharged with 10 percocet and zofran.        Illene Labrador, PA-C 06/21/12 [id number]

## 2012-06-21 NOTE — ED Notes (Signed)
Bedside report received from previous RN. Pt concerned about how she is going to get home. Lives in [place]. Has made arrangements to stay with a friend in [place]. Bus pass given.

## 2012-06-21 NOTE — ED Notes (Signed)
Patient transported to MRI with Rn

## 2012-06-21 NOTE — ED Notes (Signed)
Back from MRI.

## 2012-06-21 NOTE — ED Notes (Signed)
[id number] Expected date:06/21/12<BR> Expected time: 3:04 PM<BR> Means of arrival:Ambulance<BR> Comments:<BR> Headache, photophobia

## 2012-06-21 NOTE — ED Provider Notes (Signed)
Medical screening examination/treatment/procedure(s) were performed by non-physician practitioner and as supervising physician I was immediately available for consultation/collaboration. Rolland Porter, MD, Abr[facility] Sander   Janice Norrie, MD 06/21/12 [phone]

## 2012-06-21 NOTE — ED Notes (Signed)
Pt brought in by EMS for Migraines x 2wks unable to get her prescriptions filled  That were given to her this week for her Migraines  she stated" that they told me i can come back if pain gets worse"

## 2012-08-17 ENCOUNTER — Encounter (HOSPITAL_COMMUNITY): Payer: Self-pay | Admitting: *Deleted

## 2012-08-17 ENCOUNTER — Emergency Department (HOSPITAL_COMMUNITY)
Admission: EM | Admit: 2012-08-17 | Discharge: 2012-08-17 | Disposition: A | Discharge status: Home / Self Care | Payer: Self-pay | Attending: Emergency Medicine | Admitting: Emergency Medicine

## 2012-08-17 DIAGNOSIS — Z79899 Other long term (current) drug therapy: Secondary | ICD-10-CM | POA: Insufficient documentation

## 2012-08-17 DIAGNOSIS — R110 Nausea: Secondary | ICD-10-CM | POA: Insufficient documentation

## 2012-08-17 DIAGNOSIS — J45909 Unspecified asthma, uncomplicated: Secondary | ICD-10-CM | POA: Insufficient documentation

## 2012-08-17 DIAGNOSIS — G43909 Migraine, unspecified, not intractable, without status migrainosus: Secondary | ICD-10-CM | POA: Insufficient documentation

## 2012-08-17 MED ORDER — OXYCODONE-ACETAMINOPHEN 7.5-325 MG PO TABS: 1.0000 | ORAL_TABLET | ORAL | Status: DC | PRN

## 2012-08-17 MED ORDER — PROMETHAZINE HCL 25 MG/ML IJ SOLN
12.5000 mg | INTRAMUSCULAR | Status: DC | PRN
  Filled 2012-08-17: qty 1

## 2012-08-17 MED ORDER — KETOROLAC TROMETHAMINE 30 MG/ML IJ SOLN
30.0000 mg | Freq: Once | INTRAMUSCULAR | Status: AC
  Administered 2012-08-17: 30 mg via INTRAVENOUS
  Filled 2012-08-17: qty 1

## 2012-08-17 MED ORDER — DEXAMETHASONE SODIUM PHOSPHATE 4 MG/ML IJ SOLN
4.0000 mg | Freq: Once | INTRAMUSCULAR | Status: AC
  Administered 2012-08-17: 4 mg via INTRAVENOUS
  Filled 2012-08-17: qty 1

## 2012-08-17 MED ORDER — PROMETHAZINE HCL 25 MG/ML IJ SOLN
25.0000 mg | INTRAMUSCULAR | Status: DC | PRN
  Filled 2012-08-17: qty 1

## 2012-08-17 MED ORDER — FENTANYL CITRATE 0.05 MG/ML IJ SOLN
100.0000 ug | Freq: Once | INTRAMUSCULAR | Status: AC
  Administered 2012-08-17: 100 ug via INTRAVENOUS
  Filled 2012-08-17: qty 2

## 2012-08-17 MED ORDER — SODIUM CHLORIDE 0.9 % IV BOLUS (SEPSIS)
1000.0000 mL | Freq: Once | INTRAVENOUS | Status: AC
  Administered 2012-08-17: 1000 mL via INTRAVENOUS

## 2012-08-17 MED ORDER — HYDROMORPHONE HCL PF 1 MG/ML IJ SOLN
1.0000 mg | Freq: Once | INTRAMUSCULAR | Status: AC
  Administered 2012-08-17: 1 mg via INTRAVENOUS
  Filled 2012-08-17: qty 1

## 2012-08-17 NOTE — ED Notes (Signed)
Pt reports migraine x 1.5 weeks with nausea this am. Took imitrex without relief. States always has to have IV with medications.

## 2012-08-17 NOTE — Discharge Instructions (Signed)
Migraine Headache A migraine headache is very bad, throbbing pain on one or both sides of your head. Talk to your doctor about what things may bring on (trigger) your migraine headaches. HOME CARE  Only take medicines as told by your doctor.  Lie down in a dark, quiet room when you have a migraine.  Keep a journal to find out if certain things bring on migraine headaches. For example, write down:  What you eat and drink.  How much sleep you get.  Any change to your diet or medicines.  Lessen how much alcohol you drink.  Quit smoking if you smoke.  Get enough sleep.  Lessen any stress in your life.  Keep lights dim if bright lights bother you or make your migraines worse. GET HELP RIGHT AWAY IF:   Your migraine becomes really bad.  You have a fever.  You have a stiff neck.  You have trouble seeing.  Your muscles are weak, or you lose muscle control.  You lose your balance or have trouble walking.  You feel like you will pass out (faint), or you pass out.  You have really bad symptoms that are different than your first symptoms. MAKE SURE YOU:   Understand these instructions.  Will watch your condition.  Will get help right away if you are not doing well or get worse. Document Released: 04/23/2008 Document Revised: 10/07/2011 Document Reviewed: 07/05/2011 [facility] Patient Information 2013 [address].

## 2012-08-21 ENCOUNTER — Emergency Department (HOSPITAL_COMMUNITY)
Admission: EM | Admit: 2012-08-21 | Discharge: 2012-08-21 | Disposition: A | Discharge status: Home / Self Care | Payer: Self-pay | Attending: Emergency Medicine | Admitting: Emergency Medicine

## 2012-08-21 ENCOUNTER — Encounter (HOSPITAL_COMMUNITY): Payer: Self-pay | Admitting: *Deleted

## 2012-08-21 DIAGNOSIS — Z79899 Other long term (current) drug therapy: Secondary | ICD-10-CM | POA: Insufficient documentation

## 2012-08-21 DIAGNOSIS — J45909 Unspecified asthma, uncomplicated: Secondary | ICD-10-CM | POA: Insufficient documentation

## 2012-08-21 DIAGNOSIS — G43909 Migraine, unspecified, not intractable, without status migrainosus: Secondary | ICD-10-CM | POA: Insufficient documentation

## 2012-08-21 DIAGNOSIS — R112 Nausea with vomiting, unspecified: Secondary | ICD-10-CM | POA: Insufficient documentation

## 2012-08-21 MED ORDER — OXYCODONE-ACETAMINOPHEN 5-325 MG PO TABS: 2.0000 | ORAL_TABLET | ORAL | Status: DC | PRN

## 2012-08-21 MED ORDER — KETOROLAC TROMETHAMINE 30 MG/ML IJ SOLN
30.0000 mg | Freq: Once | INTRAMUSCULAR | Status: AC
  Administered 2012-08-21: 30 mg via INTRAVENOUS
  Filled 2012-08-21: qty 1

## 2012-08-21 MED ORDER — DIPHENHYDRAMINE HCL 50 MG/ML IJ SOLN
50.0000 mg | Freq: Once | INTRAMUSCULAR | Status: AC
  Administered 2012-08-21: 50 mg via INTRAVENOUS
  Filled 2012-08-21: qty 1

## 2012-08-21 MED ORDER — METOCLOPRAMIDE HCL 5 MG/ML IJ SOLN
10.0000 mg | INTRAMUSCULAR | Status: AC
  Administered 2012-08-21: 10 mg via INTRAVENOUS
  Filled 2012-08-21: qty 2

## 2012-08-21 MED ORDER — SODIUM CHLORIDE 0.9 % IV BOLUS (SEPSIS)
1000.0000 mL | Freq: Once | INTRAVENOUS | Status: AC
  Administered 2012-08-21: 1000 mL via INTRAVENOUS

## 2012-08-21 MED ORDER — PROMETHAZINE HCL 25 MG PO TABS: 25.0000 mg | ORAL_TABLET | Freq: Four times a day (QID) | ORAL | Status: DC | PRN

## 2012-08-21 NOTE — ED Provider Notes (Signed)
History     CSN: [id number]  Arrival date & time 08/21/12  1220   First MD Initiated Contact with Patient 08/21/12 1237      Chief Complaint  Patient presents with  . Migraine    (Consider location/radiation/quality/duration/timing/severity/associated sxs/prior treatment) HPI Comments: Patient is a 42 year old female with a past medical history of chronic migraines who presents with a headache for the past 3 days. Patient reports a gradual onset and progressive worsening of the headache. The pain is sharp, constant and is located in generalized head without radiation. Patient has tried imitrex and Percocet for symptoms without relief. No alleviating/aggravating factors. Patient reports associated nausea and photophobia. Patient denies fever, vomiting, diarrhea, numbness/tingling, weakness, visual changes, congestion, chest pain, SOB, abdominal pain.      Past Medical History  Diagnosis Date  . Migraines   . Asthma     History reviewed. No pertinent past surgical history.  History reviewed. No pertinent family history.  History  Substance Use Topics  . Smoking status: Never Smoker   . Smokeless tobacco: Not on file  . Alcohol Use: No    OB History    Grav Para Term Preterm Abortions TAB SAB Ect Mult Living                  Review of Systems  Gastrointestinal: Positive for nausea and vomiting.  Neurological: Positive for headaches.  All other systems reviewed and are negative.    Allergies  Review of patient's allergies indicates no known allergies.  Home Medications   Current Outpatient Rx  Name  Route  Sig  Dispense  Refill  . ALBUTEROL SULFATE HFA 108 (90 BASE) MCG/ACT IN AERS   Inhalation   Inhale 2 puffs into the lungs every 6 (six) hours as needed. For shortness of breath         . CITALOPRAM HYDROBROMIDE 20 MG PO TABS   Oral   Take 20 mg by mouth daily.         . OXYCODONE-ACETAMINOPHEN 7.5-325 MG PO TABS   Oral   Take 1 tablet by mouth  every 4 (four) hours as needed for pain.   15 tablet   0   . SUMATRIPTAN SUCCINATE 50 MG PO TABS   Oral   Take 50 mg by mouth every 2 (two) hours as needed. migraine           BP 144/98  Pulse 86  Temp 98.4 F (36.9 C) (Oral)  Resp 16  SpO2 100%  Physical Exam  Nursing note and vitals reviewed. Constitutional: She is oriented to person, place, and time. She appears well-developed and well-nourished. No distress.  HENT:  Head: Normocephalic and atraumatic.  Eyes: Conjunctivae normal and EOM are normal. Pupils are equal, round, and reactive to light. No scleral icterus.       photophobic  Neck: Normal range of motion. Neck supple.  Cardiovascular: Normal rate and regular rhythm.  Exam reveals no gallop and no friction rub.   No murmur heard. Pulmonary/Chest: Effort normal and breath sounds normal. She has no wheezes. She has no rales. She exhibits no tenderness.  Abdominal: Soft. She exhibits no distension. There is no tenderness. There is no rebound.  Musculoskeletal: Normal range of motion.  Neurological: She is alert and oriented to person, place, and time. Coordination normal.       Speech is goal-oriented. Moves limbs without ataxia.   Skin: Skin is warm and dry.  Psychiatric: She has a  normal mood and affect. Her behavior is normal.    ED Course  Procedures (including critical care time)  Labs Reviewed - No data to display No results found.   1. Migraine       MDM  1:28 PM Patient will have migraine cocktail.   2:58 PM Patient feeling better and is ready to go home. Patient instructed to follow up with her PCP for further evaluation and management. Patient given Percocet and Phenergan for symptoms. Vitals signs stable. Patient afebrile without meningeal signs. Patient instructed to return with worsening or concerning symptoms.       Alvina Chou, PA-C 08/21/12 1510

## 2012-08-21 NOTE — ED Provider Notes (Signed)
Medical screening examination/treatment/procedure(s) were performed by non-physician practitioner and as supervising physician I was immediately available for consultation/collaboration.   Orpah Greek, MD 08/21/12 [phone]

## 2012-08-21 NOTE — ED Notes (Signed)
Pt reports migraine and nausea x 2-3 days.  States that she needs an IV with something stronger than morphine for her pain.  Pt laughing, reports that she is hungry.  Pt is photophobic, states hx of migraines.

## 2012-08-25 ENCOUNTER — Emergency Department (HOSPITAL_COMMUNITY)
Admission: EM | Admit: 2012-08-25 | Discharge: 2012-08-25 | Disposition: A | Discharge status: Home / Self Care | Payer: Self-pay | Attending: Emergency Medicine | Admitting: Emergency Medicine

## 2012-08-25 ENCOUNTER — Encounter (HOSPITAL_COMMUNITY): Payer: Self-pay | Admitting: *Deleted

## 2012-08-25 DIAGNOSIS — Z79899 Other long term (current) drug therapy: Secondary | ICD-10-CM | POA: Insufficient documentation

## 2012-08-25 DIAGNOSIS — J45909 Unspecified asthma, uncomplicated: Secondary | ICD-10-CM | POA: Insufficient documentation

## 2012-08-25 DIAGNOSIS — R51 Headache: Secondary | ICD-10-CM

## 2012-08-25 DIAGNOSIS — G43909 Migraine, unspecified, not intractable, without status migrainosus: Secondary | ICD-10-CM | POA: Insufficient documentation

## 2012-08-25 DIAGNOSIS — Z7982 Long term (current) use of aspirin: Secondary | ICD-10-CM | POA: Insufficient documentation

## 2012-08-25 DIAGNOSIS — R112 Nausea with vomiting, unspecified: Secondary | ICD-10-CM | POA: Insufficient documentation

## 2012-08-25 DIAGNOSIS — R42 Dizziness and giddiness: Secondary | ICD-10-CM | POA: Insufficient documentation

## 2012-08-25 MED ORDER — DIPHENHYDRAMINE HCL 50 MG/ML IJ SOLN
25.0000 mg | Freq: Once | INTRAMUSCULAR | Status: AC
  Administered 2012-08-25: 25 mg via INTRAMUSCULAR
  Filled 2012-08-25: qty 1

## 2012-08-25 MED ORDER — IBUPROFEN 800 MG PO TABS: 800.0000 mg | ORAL_TABLET | Freq: Three times a day (TID) | ORAL | Status: DC

## 2012-08-25 MED ORDER — METOCLOPRAMIDE HCL 5 MG/ML IJ SOLN
10.0000 mg | Freq: Once | INTRAMUSCULAR | Status: AC
  Administered 2012-08-25: 10 mg via INTRAMUSCULAR
  Filled 2012-08-25: qty 2

## 2012-08-25 MED ORDER — KETOROLAC TROMETHAMINE 60 MG/2ML IM SOLN
60.0000 mg | Freq: Once | INTRAMUSCULAR | Status: AC
  Administered 2012-08-25: 60 mg via INTRAMUSCULAR
  Filled 2012-08-25: qty 2

## 2012-08-25 MED ORDER — HYDROMORPHONE HCL PF 1 MG/ML IJ SOLN
1.0000 mg | Freq: Once | INTRAMUSCULAR | Status: AC
  Administered 2012-08-25: 1 mg via INTRAMUSCULAR
  Filled 2012-08-25: qty 1

## 2012-08-25 NOTE — ED Notes (Signed)
Pt from home with reports of migraine, photophobia and N/V that started about a week and half ago. Pt reports being treated for same here with improvement for a couple days but symptoms returned.

## 2012-08-25 NOTE — Progress Notes (Signed)
given health reform information also

## 2012-08-25 NOTE — Progress Notes (Signed)
Pt noted with 6 ED visits in last 6 months CM consulted by ED patient advocate when pt states she needs information on how to get her medication Reports returning to [facility] from [place] spoke with pt who confirms self pay [facility] resident with no pcp. CM discussed and provided written information for self pay pcps, importance of pcp for f/u care, www.needymeds.org, discounted pharmacies, and other [place] resources such as financial assistance, DSS and  health department Reviewed Health connect number to assist with finding self pay provider close to pt's residence. Reviewed resources for [employer], general medical clinics, [facility] out patient pharmacies, housing, and other resources in [employer] Pt voiced understanding and appreciation of resources provided Pt states she has received medications from [facility] already CM discussed the [facility] program She confirms she has used [employer] $4 list

## 2012-08-25 NOTE — ED Notes (Signed)
MD at bedside. 

## 2012-08-25 NOTE — Progress Notes (Signed)
Stopped by to offer support. Patient was cold. Got permission to get her a blanket and gave her one. Presence; support.

## 2012-08-25 NOTE — ED Provider Notes (Signed)
History     CSN: [id number]  Arrival date & time 08/25/12  1416   First MD Initiated Contact with Patient 08/25/12 1512      Chief Complaint  Patient presents with  . Migraine  . Nausea  . Emesis    (Consider location/radiation/quality/duration/timing/severity/associated sxs/prior treatment) HPI Patricia [name] is a 42 y.o. female who presents with complaint of headache. States started a week ago. Hx hs of the same. Was diagnosed with migraines. States does not have a PCP at this time. States was seen here a week ago for the same. States was treated in ED, prescribed percocet. No pain while on percocet, once stopped, pain returned. States pain across forehead, admits to photophobia, nausea, vomiting x1. States tried Excedrin migraine and goodies with no relief. No numbness or weakness in extremities. No visual changes. No other complaints.    Past Medical History  Diagnosis Date  . Migraines   . Asthma     History reviewed. No pertinent past surgical history.  History reviewed. No pertinent family history.  History  Substance Use Topics  . Smoking status: Never Smoker   . Smokeless tobacco: Never Used  . Alcohol Use: Yes     Comment: occ    OB History    Grav Para Term Preterm Abortions TAB SAB Ect Mult Living                  Review of Systems  Constitutional: Negative for fever and chills.  HENT: Negative for neck pain and neck stiffness.   Eyes: Positive for photophobia. Negative for pain and visual disturbance.  Cardiovascular: Negative.   Gastrointestinal: Negative.   Musculoskeletal: Negative.   Neurological: Positive for dizziness and headaches. Negative for weakness and numbness.    Allergies  Review of patient's allergies indicates no known allergies.  Home Medications   Current Outpatient Rx  Name  Route  Sig  Dispense  Refill  . ALBUTEROL SULFATE HFA 108 (90 BASE) MCG/ACT IN AERS   Inhalation   Inhale 2 puffs into the lungs every 6 (six)  hours as needed. For shortness of breath         . ASPIRIN-ACETAMINOPHEN-CAFFEINE 250-250-65 MG PO TABS   Oral   Take 2 tablets by mouth every 6 (six) hours as needed. For pain.         Marland Kitchen CITALOPRAM HYDROBROMIDE 20 MG PO TABS   Oral   Take 20 mg by mouth daily.         Marland Kitchen PROMETHAZINE HCL 25 MG PO TABS   Oral   Take 1 tablet (25 mg total) by mouth every 6 (six) hours as needed for nausea.   12 tablet   0   . SUMATRIPTAN SUCCINATE 50 MG PO TABS   Oral   Take 50 mg by mouth every 2 (two) hours as needed. migraine           BP 129/93  Pulse 80  Temp 98.5 F (36.9 C) (Oral)  Resp 18  Wt 248 lb (112.492 kg)  SpO2 99%  LMP 08/03/2012  Physical Exam  Nursing note and vitals reviewed. Constitutional: She is oriented to person, place, and time. She appears well-developed and well-nourished. No distress.  HENT:  Head: Normocephalic and atraumatic.  Eyes: Conjunctivae normal and EOM are normal.       Pupils constricted, about 14mm, reactive  Neck: Normal range of motion. Neck supple.  Cardiovascular: Normal rate, regular rhythm and normal heart sounds.  Pulmonary/Chest: Effort normal and breath sounds normal. No respiratory distress. She has no wheezes. She has no rales.  Abdominal: Soft. Bowel sounds are normal. She exhibits no distension. There is no tenderness. There is no rebound.  Musculoskeletal: She exhibits no edema.  Neurological: She is alert and oriented to person, place, and time.       5/5 and equal upper and lower extremity strength bilaterally. Equal grip strength bilaterally. Normal finger to nose and heel to shin. No pronator drift. Patellar reflexes 2+   Skin: Skin is warm and dry.    ED Course  Procedures (including critical care time)  Pt with migraine headache, similar to prior. Has had prior work up for this, including CT and MRI, all normal. No pcp.   6:02 PM Pt continues to have pain after reglan, toradol, benadryl IM. Will try  dilaudid  6:39 PM Pt feeling better after Dilaudid IM. She is asking for percocet prescription. Will not fill this time. Looked up through controlled substance website, pt has been getting percoset form here and [facility]. Advised to take excedrin migraine. She is also non compliant with her cpap machine, instructed to wear it. Follow up with pcp.     1. Headache       MDM  Pt with typical for her migraine. Pain controlled in ED. Prior work ups and visit here for the same. No focal neuro deficits. Pt stable for d/c home with follow up. No red flags to suggest meningismus, SAH, or any other life threatening conditions.   Filed Vitals:   08/25/12 1503  BP: 129/93  Pulse: 80  Temp: 98.5 F (36.9 C)  Resp: [place], [place] 08/25/12 1852

## 2012-08-27 NOTE — ED Provider Notes (Signed)
Medical screening examination/treatment/procedure(s) were performed by non-physician practitioner and as supervising physician I was immediately available for consultation/collaboration.   Mirna Mires, MD 08/27/12 [phone]

## 2012-08-28 NOTE — ED Provider Notes (Signed)
History     CSN: [id number]  Arrival date & time 08/17/12  1418   First MD Initiated Contact with Patient 08/17/12 1843      Chief Complaint  Patient presents with  . Migraine   HPI Pt reports migraine x 1.5 weeks with nausea this am. Took imitrex without relief. States always has to have IV with medications.  Past Medical History  Diagnosis Date  . Migraines   . Asthma     History reviewed. No pertinent past surgical history.  No family history on file.  History  Substance Use Topics  . Smoking status: Never Smoker   . Smokeless tobacco: Never Used  . Alcohol Use: Yes     Comment: occ    OB History    Grav Para Term Preterm Abortions TAB SAB Ect Mult Living                  Review of Systems  All other systems reviewed and are negative.    Allergies  Review of patient's allergies indicates no known allergies.  Home Medications   Current Outpatient Rx  Name  Route  Sig  Dispense  Refill  . CITALOPRAM HYDROBROMIDE 20 MG PO TABS   Oral   Take 20 mg by mouth daily.         . SUMATRIPTAN SUCCINATE 50 MG PO TABS   Oral   Take 50 mg by mouth every 2 (two) hours as needed. migraine         . ALBUTEROL SULFATE HFA 108 (90 BASE) MCG/ACT IN AERS   Inhalation   Inhale 2 puffs into the lungs every 6 (six) hours as needed. For shortness of breath         . ASPIRIN-ACETAMINOPHEN-CAFFEINE 250-250-65 MG PO TABS   Oral   Take 2 tablets by mouth every 6 (six) hours as needed. For pain.         . IBUPROFEN 800 MG PO TABS   Oral   Take 1 tablet (800 mg total) by mouth 3 (three) times daily.   21 tablet   0   . PROMETHAZINE HCL 25 MG PO TABS   Oral   Take 1 tablet (25 mg total) by mouth every 6 (six) hours as needed for nausea.   12 tablet   0     BP 138/73  Pulse 83  Temp 98.5 F (36.9 C) (Oral)  Resp 16  SpO2 99%  Physical Exam  Nursing note and vitals reviewed. Constitutional: She is oriented to person, place, and time. She appears  well-developed and well-nourished. No distress.  HENT:  Head: Normocephalic and atraumatic.  Eyes: Pupils are equal, round, and reactive to light.  Neck: Normal range of motion.  Cardiovascular: Normal rate and intact distal pulses.   Pulmonary/Chest: No respiratory distress.  Abdominal: Normal appearance. She exhibits no distension.  Musculoskeletal: Normal range of motion.  Neurological: She is alert and oriented to person, place, and time. She has normal strength. No cranial nerve deficit or sensory deficit. She displays a negative Romberg sign. GCS eye subscore is 4. GCS verbal subscore is 5. GCS motor subscore is 6.  Skin: Skin is warm and dry. No rash noted.  Psychiatric: She has a normal mood and affect. Her behavior is normal.    ED Course  Procedures (including critical care time)  Labs Reviewed - No data to display No results found.   1. MIGRAINE HEADACHE       MDM  After treatment in the ED the patient feels back to baseline and wants to go home.         Dot Lanes, MD 08/28/12 [phone]

## 2012-09-21 ENCOUNTER — Encounter (HOSPITAL_COMMUNITY): Payer: Self-pay | Admitting: Emergency Medicine

## 2012-09-21 ENCOUNTER — Emergency Department (HOSPITAL_COMMUNITY)
Admission: EM | Admit: 2012-09-21 | Discharge: 2012-09-21 | Disposition: A | Discharge status: Home / Self Care | Payer: Self-pay | Attending: Emergency Medicine | Admitting: Emergency Medicine

## 2012-09-21 DIAGNOSIS — Z5321 Procedure and treatment not carried out due to patient leaving prior to being seen by health care provider: Secondary | ICD-10-CM

## 2012-09-21 DIAGNOSIS — J45909 Unspecified asthma, uncomplicated: Secondary | ICD-10-CM | POA: Insufficient documentation

## 2012-09-21 DIAGNOSIS — M545 Low back pain, unspecified: Secondary | ICD-10-CM | POA: Insufficient documentation

## 2012-09-21 DIAGNOSIS — G43909 Migraine, unspecified, not intractable, without status migrainosus: Secondary | ICD-10-CM | POA: Insufficient documentation

## 2012-09-21 DIAGNOSIS — R1110 Vomiting, unspecified: Secondary | ICD-10-CM | POA: Insufficient documentation

## 2012-09-21 DIAGNOSIS — Z79899 Other long term (current) drug therapy: Secondary | ICD-10-CM | POA: Insufficient documentation

## 2012-09-21 NOTE — ED Notes (Signed)
Pt reports migraine which began about 4 days ago. States she has had intermittent vomiting and sensitivty to light. States pain is similar to previous headaches. Also c/o low back pain. Denies recent injury, denies loss of bowel or bladder.

## 2012-09-21 NOTE — ED Notes (Signed)
No response.  Pt to be discharged.

## 2012-09-21 NOTE — ED Notes (Signed)
Pt cannot be located in waiting room.

## 2012-09-21 NOTE — ED Provider Notes (Signed)
  Medical screening examination/treatment/procedure(s) were performed by non-physician practitioner and as supervising physician I was immediately available for consultation/collaboration.    Carmin Muskrat, MD 09/21/12 [phone]

## 2012-09-21 NOTE — ED Notes (Signed)
Pt did not respond to take back to room

## 2012-09-21 NOTE — ED Provider Notes (Signed)
History     CSN: [id number]  Arrival date & time 09/21/12  1322   First MD Initiated Contact with Patient 09/21/12 1625      Chief Complaint  Patient presents with  . Migraine    (Consider location/radiation/quality/duration/timing/severity/associated sxs/prior treatment) HPI  Past Medical History  Diagnosis Date  . Migraines   . Asthma     No past surgical history on file.  No family history on file.  History  Substance Use Topics  . Smoking status: Never Smoker   . Smokeless tobacco: Never Used  . Alcohol Use: Yes     Comment: occ    OB History   Grav Para Term Preterm Abortions TAB SAB Ect Mult Living                  Review of Systems  Allergies  Review of patient's allergies indicates no known allergies.  Home Medications   Current Outpatient Rx  Name  Route  Sig  Dispense  Refill  . albuterol (PROVENTIL HFA;VENTOLIN HFA) 108 (90 BASE) MCG/ACT inhaler   Inhalation   Inhale 2 puffs into the lungs every 6 (six) hours as needed. For shortness of breath         . aspirin-acetaminophen-caffeine (EXCEDRIN MIGRAINE) 250-250-65 MG per tablet   Oral   Take 2 tablets by mouth every 6 (six) hours as needed. For pain.         . citalopram (CELEXA) 20 MG tablet   Oral   Take 20 mg by mouth daily.         Marland Kitchen ibuprofen (ADVIL,MOTRIN) 800 MG tablet   Oral   Take 1 tablet (800 mg total) by mouth 3 (three) times daily.   21 tablet   0   . promethazine (PHENERGAN) 25 MG tablet   Oral   Take 1 tablet (25 mg total) by mouth every 6 (six) hours as needed for nausea.   12 tablet   0   . SUMAtriptan (IMITREX) 50 MG tablet   Oral   Take 50 mg by mouth every 2 (two) hours as needed. migraine           BP 137/100  Pulse 88  Temp(Src) 98.3 F (36.8 C) (Oral)  Resp 20  SpO2 99%  LMP 09/03/2012  Physical Exam  ED Course  Procedures (including critical care time)  Labs Reviewed - No data to display No results found.   1. Patient left  without being seen       MDM          Norman Herrlich, NP 09/21/12 1844

## 2012-09-22 ENCOUNTER — Emergency Department (HOSPITAL_COMMUNITY)
Admission: EM | Admit: 2012-09-22 | Discharge: 2012-09-22 | Disposition: A | Discharge status: Home / Self Care | Payer: Self-pay | Attending: Emergency Medicine | Admitting: Emergency Medicine

## 2012-09-22 DIAGNOSIS — X58XXXA Exposure to other specified factors, initial encounter: Secondary | ICD-10-CM | POA: Insufficient documentation

## 2012-09-22 DIAGNOSIS — Y939 Activity, unspecified: Secondary | ICD-10-CM | POA: Insufficient documentation

## 2012-09-22 DIAGNOSIS — S43499A Other sprain of unspecified shoulder joint, initial encounter: Secondary | ICD-10-CM | POA: Insufficient documentation

## 2012-09-22 DIAGNOSIS — S46811A Strain of other muscles, fascia and tendons at shoulder and upper arm level, right arm, initial encounter: Secondary | ICD-10-CM

## 2012-09-22 DIAGNOSIS — Z79899 Other long term (current) drug therapy: Secondary | ICD-10-CM | POA: Insufficient documentation

## 2012-09-22 DIAGNOSIS — J45909 Unspecified asthma, uncomplicated: Secondary | ICD-10-CM | POA: Insufficient documentation

## 2012-09-22 DIAGNOSIS — G43909 Migraine, unspecified, not intractable, without status migrainosus: Secondary | ICD-10-CM | POA: Insufficient documentation

## 2012-09-22 DIAGNOSIS — Y929 Unspecified place or not applicable: Secondary | ICD-10-CM | POA: Insufficient documentation

## 2012-09-22 MED ORDER — METHOCARBAMOL 500 MG PO TABS: 500.0000 mg | ORAL_TABLET | Freq: Two times a day (BID) | ORAL | Status: DC

## 2012-09-22 MED ORDER — METHOCARBAMOL 500 MG PO TABS: 500.0000 mg | ORAL_TABLET | Freq: Once | ORAL | Status: DC

## 2012-09-22 MED ORDER — KETOROLAC TROMETHAMINE 60 MG/2ML IM SOLN: 60.0000 mg | Freq: Once | INTRAMUSCULAR | Status: DC

## 2012-09-22 NOTE — ED Notes (Addendum)
Pt angry - speaking on the phone with sister in engergetic manner Not photophobic.  States she does not want to see Dr Lita Mains again b/c he is not giving her IV meds for her headache.  She requests to speak to charge RN.  Charge RN notified and is speaking with pt.

## 2012-09-22 NOTE — ED Notes (Signed)
Pt is angry, refusing meds and is leaving.  Dr Lita Mains has her up for discharge.  Pt refusing to take d/c papers.  Is going to call EMS from waiting room.

## 2012-09-22 NOTE — ED Provider Notes (Signed)
History  This chart was scribed for Patricia Rice, MD by Roe Coombs, ED Scribe. The patient was seen in room CD10C/CD10C. Patient's care was started at 1656.   CSN: [id number]  Arrival date & time 09/22/12  1643   First MD Initiated Contact with Patient 09/22/12 1656      Chief Complaint  Patient presents with  . Migraine  . Shoulder Pain    Patient is a 42 y.o. female presenting with shoulder pain. The history is provided by the patient. No language interpreter was used.  Shoulder Pain This is a new problem. The current episode started 2 days ago. The problem occurs constantly. The problem has not changed since onset.Associated symptoms include headaches.     HPI Comments: Patricia [name] is a 42 y.o. female who presents to the Emergency Department complaining of a constant, dull, non-radiating left trapezius pain onset 2 days ago. There is no numbness or weakness. Patient is also complaining of a migraine headache which is unchanged from previous headaches. No visual changes or neurological deficits. No recent trauma or injury. Patient has not followed up with a neurologist for migraines. Patient has had previous workups for migraines which include CTs and MRIs, all were normal. Other medical history includes asthma.   Past Medical History  Diagnosis Date  . Migraines   . Asthma     No past surgical history on file.  No family history on file.  History  Substance Use Topics  . Smoking status: Never Smoker   . Smokeless tobacco: Never Used  . Alcohol Use: Yes     Comment: occ    OB History   Grav Para Term Preterm Abortions TAB SAB Ect Mult Living                  Review of Systems  Eyes: Negative for visual disturbance.  Musculoskeletal: Positive for myalgias.  Neurological: Positive for headaches. Negative for weakness and numbness.  All other systems reviewed and are negative.    Allergies  Review of patient's allergies indicates no known  allergies.  Home Medications   Current Outpatient Rx  Name  Route  Sig  Dispense  Refill  . albuterol (PROVENTIL HFA;VENTOLIN HFA) 108 (90 BASE) MCG/ACT inhaler   Inhalation   Inhale 2 puffs into the lungs every 6 (six) hours as needed. For shortness of breath         . aspirin-acetaminophen-caffeine (EXCEDRIN MIGRAINE) 250-250-65 MG per tablet   Oral   Take 2 tablets by mouth every 6 (six) hours as needed. For pain.         . citalopram (CELEXA) 20 MG tablet   Oral   Take 20 mg by mouth daily.         Marland Kitchen HYDROcodone-acetaminophen (NORCO/VICODIN) 5-325 MG per tablet   Oral   Take 1 tablet by mouth every 6 (six) hours as needed for pain. For pain         . ibuprofen (ADVIL,MOTRIN) 800 MG tablet   Oral   Take 1 tablet (800 mg total) by mouth 3 (three) times daily.   21 tablet   0   . oxyCODONE-acetaminophen (PERCOCET) 10-325 MG per tablet   Oral   Take 1 tablet by mouth every 4 (four) hours as needed for pain. For pain         . methocarbamol (ROBAXIN) 500 MG tablet   Oral   Take 1 tablet (500 mg total) by mouth 2 (two) times daily.  20 tablet   0     Triage Vitals: BP 124/85  Pulse 92  Temp(Src) 98.4 F (36.9 C) (Oral)  Resp 24  SpO2 96%  LMP 09/03/2012  Physical Exam  Constitutional: She is oriented to person, place, and time. She appears well-developed and well-nourished.  HENT:  Head: Normocephalic and atraumatic.  Eyes: Conjunctivae and EOM are normal. Pupils are equal, round, and reactive to light.  Neck: Normal range of motion. Neck supple.  Cardiovascular: Normal rate.   Pulmonary/Chest: Effort normal.  Musculoskeletal: Normal range of motion. She exhibits tenderness.  Tenderness to left trapezius with palpation. Neurovascularly intact. No CTLS midline tenderness.  Neurological: She is alert and oriented to person, place, and time.  Moving all extremities. 5/5 strength throughout. No focal neurological deficits.   Skin: Skin is warm and dry.     ED Course  Procedures (including critical care time) DIAGNOSTIC STUDIES: Oxygen Saturation is 96% on room air, adequate by my interpretation.    COORDINATION OF CARE: 5:34 PM- When informed she would not receive narcotics for muscle strained, patient became quite agitated.    Labs Reviewed - No data to display No results found.   1. Trapezius strain, right, initial encounter   2. Chronic headache       MDM  I personally performed the services described in this documentation, which was scribed in my presence. The recorded information has been reviewed and is accurate.    Patricia Rice, MD 09/23/12 [phone]

## 2012-09-22 NOTE — ED Notes (Signed)
Per ems: Pt c/o upper back pain and nausea for past 3 days. Pt c/o chronic lower back pain but for last 2 weeks since moving furniture has felt upper back pain. Also c/o migraine which she has a history of. Was here yesterday for back pain but did not wait to be seen. A&ox4.

## 2012-09-22 NOTE — ED Notes (Signed)
Pt reports throwing up in triage prior to arrival to room in fast track. Pt has a hx of migraines; pt also c/o a migraine today; states light sensitivity, blurred vision, headache, nausea, vomiting; "the only thing that works is IV medication. Shots don't do nothing for me and the pills aint working". Pt tearful during assessment yelling out in pain every time she felt pain to her back. Pt also reporting lower back pain but pt states that upper back pain is worse. Pt able to move right upper extremity with little difficulty, states it is hard to move left extremity without causing a lot of pain.

## 2012-12-14 ENCOUNTER — Emergency Department (HOSPITAL_COMMUNITY)
Admission: EM | Admit: 2012-12-14 | Discharge: 2012-12-14 | Disposition: A | Discharge status: Home / Self Care | Payer: Self-pay | Attending: Emergency Medicine | Admitting: Emergency Medicine

## 2012-12-14 ENCOUNTER — Encounter (HOSPITAL_COMMUNITY): Payer: Self-pay

## 2012-12-14 DIAGNOSIS — J45909 Unspecified asthma, uncomplicated: Secondary | ICD-10-CM | POA: Insufficient documentation

## 2012-12-14 DIAGNOSIS — G43909 Migraine, unspecified, not intractable, without status migrainosus: Secondary | ICD-10-CM | POA: Insufficient documentation

## 2012-12-14 DIAGNOSIS — Z79899 Other long term (current) drug therapy: Secondary | ICD-10-CM | POA: Insufficient documentation

## 2012-12-14 DIAGNOSIS — F319 Bipolar disorder, unspecified: Secondary | ICD-10-CM | POA: Insufficient documentation

## 2012-12-14 HISTORY — DX: Bipolar disorder, unspecified: F31.9

## 2012-12-14 HISTORY — DX: Depression, unspecified: F32.A

## 2012-12-14 HISTORY — DX: Major depressive disorder, single episode, unspecified: F32.9

## 2012-12-14 MED ORDER — KETOROLAC TROMETHAMINE 30 MG/ML IJ SOLN
30.0000 mg | Freq: Once | INTRAMUSCULAR | Status: AC
  Administered 2012-12-14: 30 mg via INTRAVENOUS
  Filled 2012-12-14: qty 1

## 2012-12-14 MED ORDER — DIPHENHYDRAMINE HCL 50 MG/ML IJ SOLN
25.0000 mg | Freq: Once | INTRAMUSCULAR | Status: AC
  Administered 2012-12-14: 25 mg via INTRAVENOUS
  Filled 2012-12-14: qty 1

## 2012-12-14 MED ORDER — DEXAMETHASONE SODIUM PHOSPHATE 10 MG/ML IJ SOLN: 10.0000 mg | Freq: Once | INTRAMUSCULAR | Status: DC

## 2012-12-14 MED ORDER — METOCLOPRAMIDE HCL 5 MG/ML IJ SOLN
10.0000 mg | Freq: Once | INTRAMUSCULAR | Status: AC
  Administered 2012-12-14: 10 mg via INTRAVENOUS
  Filled 2012-12-14: qty 2

## 2012-12-14 MED ORDER — PERCOCET 5-325 MG PO TABS: 1.0000 | ORAL_TABLET | Freq: Four times a day (QID) | ORAL | Status: DC | PRN

## 2012-12-14 MED ORDER — DEXAMETHASONE SODIUM PHOSPHATE 10 MG/ML IJ SOLN
8.0000 mg | Freq: Once | INTRAMUSCULAR | Status: AC
  Administered 2012-12-14: 8 mg via INTRAVENOUS
  Filled 2012-12-14: qty 1

## 2012-12-14 MED ORDER — SODIUM CHLORIDE 0.9 % IV BOLUS (SEPSIS)
1000.0000 mL | Freq: Once | INTRAVENOUS | Status: AC
  Administered 2012-12-14: 1000 mL via INTRAVENOUS

## 2012-12-14 NOTE — ED Provider Notes (Signed)
History     CSN: [id number]  Arrival date & time 12/14/12  10   First MD Initiated Contact with Patient 12/14/12 1506      Chief Complaint  Patient presents with  . Headache    (Consider location/radiation/quality/duration/timing/severity/associated sxs/prior treatment) HPI Patient presents to the emergency department with a four-day history of migraine headache.  Patient, states, that she is currently being treated for her migraines by her primary Dr. patient, states, that she has not been to the emergency room for her migraines, and several months.  Patient denies blurred vision, vomiting, weakness, and shortness breath, fever, cough, nasal congestion, abdominal pain, or diarrhea.  Patient, states, that she did not take any medications prior to arrival for her symptoms.  Patient, states, that sounds and light make her headache, worse Past Medical History  Diagnosis Date  . Migraines   . Asthma   . Bronchitis   . Bipolar 1 disorder   . Depression     Past Surgical History  Procedure Laterality Date  . Fluid removal      brain    History reviewed. No pertinent family history.  History  Substance Use Topics  . Smoking status: Never Smoker   . Smokeless tobacco: Never Used  . Alcohol Use: Yes     Comment: occ    OB History   Grav Para Term Preterm Abortions TAB SAB Ect Mult Living                  Review of Systems All other systems negative except as documented in the HPI. All pertinent positives and negatives as reviewed in the HPI.  Allergies  Review of patient's allergies indicates no known allergies.  Home Medications   Current Outpatient Rx  Name  Route  Sig  Dispense  Refill  . albuterol (PROVENTIL HFA;VENTOLIN HFA) 108 (90 BASE) MCG/ACT inhaler   Inhalation   Inhale 2 puffs into the lungs every 6 (six) hours as needed. For shortness of breath         . atenolol (TENORMIN) 50 MG tablet   Oral   Take 50 mg by mouth 2 (two) times daily.         . citalopram (CELEXA) 20 MG tablet   Oral   Take 20 mg by mouth daily.         Marland Kitchen ibuprofen (ADVIL,MOTRIN) 800 MG tablet   Oral   Take 1 tablet (800 mg total) by mouth 3 (three) times daily.   21 tablet   0     BP 141/91  Pulse 76  Temp(Src) 98.1 F (36.7 C) (Oral)  Resp 16  Ht 5\' 4"  (1.626 m)  Wt 247 lb 6 oz (112.209 kg)  BMI 42.44 kg/m2  SpO2 96%  LMP 12/14/2012  Physical Exam  Nursing note and vitals reviewed. Constitutional: She is oriented to person, place, and time. She appears well-developed and well-nourished. No distress.  HENT:  Head: Normocephalic and atraumatic.  Mouth/Throat: Oropharynx is clear and moist.  Eyes: EOM are normal. Pupils are equal, round, and reactive to light.  Neck: Normal range of motion. Neck supple.  Cardiovascular: Normal rate and regular rhythm.  Exam reveals no gallop and no friction rub.   No murmur heard. Pulmonary/Chest: Effort normal and breath sounds normal.  Neurological: She is alert and oriented to person, place, and time. She exhibits normal muscle tone. Coordination normal.  Skin: Skin is warm and dry. No rash noted. No erythema.    ED  Course  Procedures (including critical care time)  Patient is rechecked and her migraine headache, has resolved.  Patient is feeling better following, migraine cocktail of Toradol, Benadryl Reglan, and Decadron with IV fluid.  Patient is advised return here as needed.  Told to followup with her primary Dr.. increase her fluid intake, as much as possible   MDM          Brent General, PA-C 12/14/12 1730

## 2012-12-14 NOTE — ED Notes (Signed)
Patient reports that she has a history of migraines and has this episode began 4 days ago. patient states she has vomited x 4 and is seeing "spots", and feeling dizzy. Patient positive for photophobia.

## 2012-12-14 NOTE — Progress Notes (Signed)
[facility] has seen patient and provided her with a list of Primary Care Resources.

## 2012-12-21 NOTE — ED Provider Notes (Signed)
Medical screening examination/treatment/procedure(s) were performed by non-physician practitioner and as supervising physician I was immediately available for consultation/collaboration.   Babette Relic, MD 12/21/12 [phone]

## 2012-12-28 ENCOUNTER — Encounter (HOSPITAL_COMMUNITY): Payer: Self-pay | Admitting: Emergency Medicine

## 2012-12-28 ENCOUNTER — Emergency Department (HOSPITAL_COMMUNITY)
Admission: EM | Admit: 2012-12-28 | Discharge: 2012-12-28 | Disposition: A | Discharge status: Home / Self Care | Payer: Self-pay | Attending: Emergency Medicine | Admitting: Emergency Medicine

## 2012-12-28 DIAGNOSIS — R059 Cough, unspecified: Secondary | ICD-10-CM | POA: Insufficient documentation

## 2012-12-28 DIAGNOSIS — Z8709 Personal history of other diseases of the respiratory system: Secondary | ICD-10-CM | POA: Insufficient documentation

## 2012-12-28 DIAGNOSIS — Z79899 Other long term (current) drug therapy: Secondary | ICD-10-CM | POA: Insufficient documentation

## 2012-12-28 DIAGNOSIS — G43909 Migraine, unspecified, not intractable, without status migrainosus: Secondary | ICD-10-CM | POA: Insufficient documentation

## 2012-12-28 DIAGNOSIS — J45901 Unspecified asthma with (acute) exacerbation: Secondary | ICD-10-CM | POA: Insufficient documentation

## 2012-12-28 DIAGNOSIS — R05 Cough: Secondary | ICD-10-CM | POA: Insufficient documentation

## 2012-12-28 DIAGNOSIS — R1110 Vomiting, unspecified: Secondary | ICD-10-CM | POA: Insufficient documentation

## 2012-12-28 DIAGNOSIS — F319 Bipolar disorder, unspecified: Secondary | ICD-10-CM | POA: Insufficient documentation

## 2012-12-28 MED ORDER — OXYCODONE-ACETAMINOPHEN 5-325 MG PO TABS: 1.0000 | ORAL_TABLET | Freq: Three times a day (TID) | ORAL | Status: DC | PRN

## 2012-12-28 MED ORDER — KETOROLAC TROMETHAMINE 30 MG/ML IJ SOLN
30.0000 mg | Freq: Once | INTRAMUSCULAR | Status: AC
  Administered 2012-12-28: 30 mg via INTRAVENOUS
  Filled 2012-12-28: qty 1

## 2012-12-28 MED ORDER — DEXAMETHASONE SODIUM PHOSPHATE 10 MG/ML IJ SOLN
10.0000 mg | Freq: Once | INTRAMUSCULAR | Status: AC
  Administered 2012-12-28: 10 mg via INTRAVENOUS
  Filled 2012-12-28: qty 1

## 2012-12-28 MED ORDER — METOCLOPRAMIDE HCL 5 MG/ML IJ SOLN
20.0000 mg | Freq: Once | INTRAVENOUS | Status: AC
  Administered 2012-12-28: 20 mg via INTRAVENOUS
  Filled 2012-12-28: qty 4

## 2012-12-28 MED ORDER — ONDANSETRON HCL 4 MG/2ML IJ SOLN
4.0000 mg | Freq: Once | INTRAMUSCULAR | Status: AC
  Administered 2012-12-28: 4 mg via INTRAVENOUS
  Filled 2012-12-28: qty 2

## 2012-12-28 MED ORDER — ALBUTEROL SULFATE (5 MG/ML) 0.5% IN NEBU
2.5000 mg | INHALATION_SOLUTION | RESPIRATORY_TRACT | Status: AC
  Administered 2012-12-28: 2.5 mg via RESPIRATORY_TRACT
  Filled 2012-12-28: qty 0.5

## 2012-12-28 MED ORDER — SODIUM CHLORIDE 0.9 % IV BOLUS (SEPSIS)
1000.0000 mL | Freq: Once | INTRAVENOUS | Status: AC
  Administered 2012-12-28: 1000 mL via INTRAVENOUS

## 2012-12-28 MED ORDER — SODIUM CHLORIDE 0.9 % IN NEBU
INHALATION_SOLUTION | RESPIRATORY_TRACT | Status: AC
  Administered 2012-12-28: 11:00:00
  Filled 2012-12-28: qty 3

## 2012-12-28 MED ORDER — HYDROMORPHONE HCL PF 1 MG/ML IJ SOLN
1.0000 mg | Freq: Once | INTRAMUSCULAR | Status: AC
  Administered 2012-12-28: 1 mg via INTRAVENOUS
  Filled 2012-12-28: qty 1

## 2012-12-28 MED ORDER — DIPHENHYDRAMINE HCL 50 MG/ML IJ SOLN
25.0000 mg | Freq: Once | INTRAMUSCULAR | Status: AC
  Administered 2012-12-28: 25 mg via INTRAVENOUS
  Filled 2012-12-28: qty 1

## 2012-12-28 NOTE — ED Notes (Signed)
[id number] Expected date:<BR> Expected time:<BR> Means of arrival:<BR> Comments:<BR>

## 2012-12-28 NOTE — ED Provider Notes (Signed)
History     CSN: [id number]  Arrival date & time 12/28/12  [id number]   First MD Initiated Contact with Patient 12/28/12 [phone]      Chief Complaint  Patient presents with  . Headache    (Consider location/radiation/quality/duration/timing/severity/associated sxs/prior treatment) HPI Patient presents with 2 complaints. Chief complaint #1: Headache Patient states that 3 days ago she suddenly developed diffuse throbbing headache.  The pain is most prominently severe retro-orbitally, but is diffuse. This headache is typical for her headaches. No new visual changes, falling, unilateral weakness, confusion, disorientation. Pain is worse over the past days as she has developed a cough. Pain is not improved with OTC medication. Chief complaint #2: Dyspnea/cough. These began yesterday, the patient was with a number of people who were smoking cigarettes. Since yesterday she's had persistent cough with production of white sputum. There is post tussive emesis. No new fever, no chills and no new dyspnea at rest. There is mild dyspnea with coughing. No relief with OTC medication or albuterol inhaler.  Past Medical History  Diagnosis Date  . Migraines   . Asthma   . Bronchitis   . Bipolar 1 disorder   . Depression     Past Surgical History  Procedure Laterality Date  . Fluid removal      brain    No family history on file.  History  Substance Use Topics  . Smoking status: Never Smoker   . Smokeless tobacco: Never Used  . Alcohol Use: Yes     Comment: occassionally    OB History   Grav Para Term Preterm Abortions TAB SAB Ect Mult Living                  Review of Systems  Constitutional:       Per HPI, otherwise negative  HENT:       Per HPI, otherwise negative  Respiratory:       Per HPI, otherwise negative  Cardiovascular:       Per HPI, otherwise negative  Gastrointestinal: Positive for vomiting.  Endocrine:       Negative aside from HPI  Genitourinary:       Neg  aside from HPI   Musculoskeletal:       Per HPI, otherwise negative  Skin: Negative.   Neurological: Positive for headaches. Negative for syncope.    Allergies  Review of patient's allergies indicates no known allergies.  Home Medications   Current Outpatient Rx  Name  Route  Sig  Dispense  Refill  . albuterol (PROVENTIL HFA;VENTOLIN HFA) 108 (90 BASE) MCG/ACT inhaler   Inhalation   Inhale 2 puffs into the lungs every 6 (six) hours as needed for shortness of breath.          Marland Kitchen albuterol (PROVENTIL) (2.5 MG/3ML) 0.083% nebulizer solution   Nebulization   Take 2.5 mg by nebulization every 6 (six) hours as needed for shortness of breath.         . ARIPiprazole (ABILIFY) 10 MG tablet   Oral   Take 10 mg by mouth at bedtime.         Marland Kitchen atenolol (TENORMIN) 50 MG tablet   Oral   Take 50 mg by mouth 2 (two) times daily.         . traZODone (DESYREL) 100 MG tablet   Oral   Take 100 mg by mouth at bedtime.           BP 128/84  Pulse 79  Temp(Src) 98.5  F (36.9 C) (Oral)  Resp 18  SpO2 99%  LMP 12/14/2012  Physical Exam  Nursing note and vitals reviewed. Constitutional: She is oriented to person, place, and time. She appears well-developed and well-nourished. No distress.  HENT:  Head: Normocephalic and atraumatic.  Eyes: Conjunctivae and EOM are normal.  Cardiovascular: Normal rate and regular rhythm.   Pulmonary/Chest: Effort normal. No accessory muscle usage or stridor. Not tachypneic. No respiratory distress. She has decreased breath sounds.  Abdominal: She exhibits no distension.  Musculoskeletal: She exhibits no edema.  Neurological: She is alert and oriented to person, place, and time. She displays no atrophy and no tremor. No cranial nerve deficit or sensory deficit. She exhibits normal muscle tone. She displays no seizure activity. Coordination and gait normal.  Skin: Skin is warm and dry.  Psychiatric: She has a normal mood and affect.    ED Course   Procedures (including critical care time)  Labs Reviewed - No data to display No results found.   No diagnosis found.  Pulse ox 97% room air normal   After the initial evaluation I reviewed the patient's chart, including a emergency department visit last month for headaches.   11:48 AM Patient remains in pain  12:35 PM Patient is sitting upright, talking on the phone. We discussed the need for f/u and the patient requested analgesics until her PMD appt. MDM  Patient presents with headache and cough, dyspnea.  On exam the patient is not hypoxic, tachypneic, tachycardic, and in no distress, though she does have mildly diminished breath sounds.  This improved, and the patient's headache improved substantially following ED interventions.  After a discussion on the need to follow up with primary care physician, the patient's request for analgesics was accommodated, and she was discharged in stable condition.        Carmin Muskrat, MD 12/28/12 [phone]

## 2012-12-28 NOTE — ED Notes (Signed)
MD at bedside. 

## 2012-12-28 NOTE — Progress Notes (Signed)
[facility] has seen patient and provided her with a list of primary care resources. °

## 2012-12-28 NOTE — ED Notes (Signed)
Pt presenting to ed with c/o headache pain x 2-3 days with nausea and vomiting.  Pt also with c/o coughing up whitish colored sputum.

## 2013-01-08 ENCOUNTER — Encounter (HOSPITAL_COMMUNITY): Payer: Self-pay | Admitting: Emergency Medicine

## 2013-01-08 ENCOUNTER — Emergency Department (HOSPITAL_COMMUNITY): Payer: Self-pay

## 2013-01-08 ENCOUNTER — Emergency Department (HOSPITAL_COMMUNITY)
Admission: EM | Admit: 2013-01-08 | Discharge: 2013-01-08 | Disposition: A | Discharge status: Home / Self Care | Payer: Self-pay | Attending: Emergency Medicine | Admitting: Emergency Medicine

## 2013-01-08 DIAGNOSIS — F319 Bipolar disorder, unspecified: Secondary | ICD-10-CM | POA: Insufficient documentation

## 2013-01-08 DIAGNOSIS — Z79899 Other long term (current) drug therapy: Secondary | ICD-10-CM | POA: Insufficient documentation

## 2013-01-08 DIAGNOSIS — G43909 Migraine, unspecified, not intractable, without status migrainosus: Secondary | ICD-10-CM | POA: Insufficient documentation

## 2013-01-08 DIAGNOSIS — J45909 Unspecified asthma, uncomplicated: Secondary | ICD-10-CM | POA: Insufficient documentation

## 2013-01-08 DIAGNOSIS — J209 Acute bronchitis, unspecified: Secondary | ICD-10-CM | POA: Insufficient documentation

## 2013-01-08 DIAGNOSIS — J40 Bronchitis, not specified as acute or chronic: Secondary | ICD-10-CM

## 2013-01-08 LAB — COMPREHENSIVE METABOLIC PANEL
Albumin: 3.7 g/dL (ref 3.5–5.2)
Alkaline Phosphatase: 42 U/L (ref 39–117)
BUN: 10 mg/dL (ref 6–23)
CO2: 24 mEq/L (ref 19–32)
Chloride: 107 mEq/L (ref 96–112)
Creatinine, Ser: 0.93 mg/dL (ref 0.50–1.10)
GFR calc non Af Amer: 75 mL/min — ABNORMAL LOW (ref >90)
Glucose, Bld: 93 mg/dL (ref 70–99)
Potassium: 3.6 mEq/L (ref 3.5–5.1)
Total Bilirubin: 0.3 mg/dL (ref 0.3–1.2)

## 2013-01-08 LAB — CBC WITH DIFFERENTIAL/PLATELET
HCT: 35.8 % — ABNORMAL LOW (ref 36.0–46.0)
Hemoglobin: 11.8 g/dL — ABNORMAL LOW (ref 12.0–15.0)
Lymphocytes Relative: 26 % (ref 12–46)
Lymphs Abs: 2.3 10*3/uL (ref 0.7–4.0)
Monocytes Absolute: 0.6 10*3/uL (ref 0.1–1.0)
Monocytes Relative: 7 % (ref 3–12)
Neutro Abs: 6 10*3/uL (ref 1.7–7.7)
Neutrophils Relative %: 66 % (ref 43–77)
RBC: 4.68 MIL/uL (ref 3.87–5.11)

## 2013-01-08 LAB — URINALYSIS, ROUTINE W REFLEX MICROSCOPIC
Glucose, UA: NEGATIVE mg/dL
Ketones, ur: NEGATIVE mg/dL
Leukocytes, UA: NEGATIVE
Protein, ur: NEGATIVE mg/dL
Urobilinogen, UA: 0.2 mg/dL (ref 0.0–1.0)

## 2013-01-08 MED ORDER — ACETAMINOPHEN-CODEINE 120-12 MG/5ML PO SOLN
10.0000 mL | ORAL | Status: DC | PRN
Start: 2013-01-08 — End: 2013-04-06

## 2013-01-08 MED ORDER — PREDNISONE 50 MG PO TABS: 50.0000 mg | ORAL_TABLET | Freq: Every day | ORAL | Status: DC

## 2013-01-08 MED ORDER — DIPHENHYDRAMINE HCL 50 MG/ML IJ SOLN
25.0000 mg | Freq: Once | INTRAMUSCULAR | Status: AC
  Administered 2013-01-08: 25 mg via INTRAVENOUS
  Filled 2013-01-08: qty 1

## 2013-01-08 MED ORDER — GUAIFENESIN ER 1200 MG PO TB12: 1.0000 | ORAL_TABLET | Freq: Two times a day (BID) | ORAL | Status: DC

## 2013-01-08 MED ORDER — SODIUM CHLORIDE 0.9 % IV BOLUS (SEPSIS)
1000.0000 mL | Freq: Once | INTRAVENOUS | Status: AC
  Administered 2013-01-08: 1000 mL via INTRAVENOUS

## 2013-01-08 MED ORDER — KETOROLAC TROMETHAMINE 30 MG/ML IJ SOLN
30.0000 mg | Freq: Once | INTRAMUSCULAR | Status: AC
  Administered 2013-01-08: 30 mg via INTRAVENOUS
  Filled 2013-01-08: qty 1

## 2013-01-08 MED ORDER — METOCLOPRAMIDE HCL 5 MG/ML IJ SOLN
10.0000 mg | Freq: Once | INTRAMUSCULAR | Status: AC
  Administered 2013-01-08: 10 mg via INTRAVENOUS
  Filled 2013-01-08: qty 2

## 2013-01-08 MED ORDER — ALBUTEROL SULFATE (5 MG/ML) 0.5% IN NEBU
5.0000 mg | INHALATION_SOLUTION | Freq: Once | RESPIRATORY_TRACT | Status: AC
  Administered 2013-01-08: 5 mg via RESPIRATORY_TRACT
  Filled 2013-01-08: qty 1

## 2013-01-08 NOTE — ED Provider Notes (Signed)
Medical screening examination/treatment/procedure(s) were performed by non-physician practitioner and as supervising physician I was immediately available for consultation/collaboration.   Mirna Mires, MD 01/08/13 2002

## 2013-01-08 NOTE — ED Notes (Signed)
Per pt, has been vomiting up mucus for 3 days-increase cough for 2 weeks-side pain from coughing and vomiting

## 2013-01-08 NOTE — ED Notes (Signed)
Pt ambulated to the BR with steady gait.  Heather RT at bedside to start pt on breathing tx

## 2013-01-08 NOTE — ED Provider Notes (Signed)
History     CSN: [id number]  Arrival date & time 01/08/13  [id number]   First MD Initiated Contact with Patient 01/08/13 1006      Chief Complaint  Patient presents with  . Emesis    (Consider location/radiation/quality/duration/timing/severity/associated sxs/prior treatment) HPI Patient presents emergency department with cough for the last 2 weeks, and she states that this triggered a migraine headache.  The patient, states, that she also has soreness from coughing in her bilateral ribs.  Patient denies nausea, vomiting, abdominal pain, shortness of breath, fever, weakness, blurred vision, numbness, or syncope.  Patient, states, that she did not take any medications prior to arrival.  States nothing seems make her condition, better or worse.  She did use her inhaler with some relief of her symptoms. Past Medical History  Diagnosis Date  . Migraines   . Asthma   . Bronchitis   . Bipolar 1 disorder   . Depression     Past Surgical History  Procedure Laterality Date  . Fluid removal      brain    No family history on file.  History  Substance Use Topics  . Smoking status: Never Smoker   . Smokeless tobacco: Never Used  . Alcohol Use: Yes     Comment: occassionally    OB History   Grav Para Term Preterm Abortions TAB SAB Ect Mult Living                  Review of Systems All other systems negative except as documented in the HPI. All pertinent positives and negatives as reviewed in the HPI. Allergies  Peanut-containing drug products  Home Medications   Current Outpatient Rx  Name  Route  Sig  Dispense  Refill  . albuterol (PROVENTIL HFA;VENTOLIN HFA) 108 (90 BASE) MCG/ACT inhaler   Inhalation   Inhale 2 puffs into the lungs every 6 (six) hours as needed for shortness of breath.          Marland Kitchen albuterol (PROVENTIL) (2.5 MG/3ML) 0.083% nebulizer solution   Nebulization   Take 2.5 mg by nebulization every 6 (six) hours as needed for shortness of breath.         .  ARIPiprazole (ABILIFY) 10 MG tablet   Oral   Take 10 mg by mouth at bedtime.         Marland Kitchen atenolol (TENORMIN) 50 MG tablet   Oral   Take 50 mg by mouth 2 (two) times daily.         Marland Kitchen esomeprazole (NEXIUM) 40 MG capsule   Oral   Take 40 mg by mouth daily before breakfast.         . ibuprofen (ADVIL,MOTRIN) 200 MG tablet   Oral   Take 600 mg by mouth every 6 (six) hours as needed for pain or headache.         . traZODone (DESYREL) 100 MG tablet   Oral   Take 100 mg by mouth at bedtime.           BP 136/93  Pulse 75  Temp(Src) 98.3 F (36.8 C)  Resp 18  SpO2 100%  LMP 12/14/2012  Physical Exam  Nursing note and vitals reviewed. Constitutional: She is oriented to person, place, and time. She appears well-developed and well-nourished. No distress.  HENT:  Head: Normocephalic and atraumatic.  Mouth/Throat: Oropharynx is clear and moist.  Eyes: Pupils are equal, round, and reactive to light.  Neck: Normal range of motion. Neck supple.  Cardiovascular:  Normal rate, regular rhythm and normal heart sounds.  Exam reveals no gallop and no friction rub.   No murmur heard. Pulmonary/Chest: Effort normal and breath sounds normal. No respiratory distress.  Neurological: She is alert and oriented to person, place, and time. She exhibits normal muscle tone. Coordination normal.  Skin: Skin is warm and dry.    ED Course  Procedures (including critical care time)  Labs Reviewed  CBC WITH DIFFERENTIAL - Abnormal; Notable for the following:    Hemoglobin 11.8 (*)    HCT 35.8 (*)    MCV 76.5 (*)    MCH 25.2 (*)    RDW 15.7 (*)    All other components within normal limits  COMPREHENSIVE METABOLIC PANEL - Abnormal; Notable for the following:    GFR calc non Af Amer 75 (*)    GFR calc Af Amer 87 (*)    All other components within normal limits  URINALYSIS, ROUTINE W REFLEX MICROSCOPIC - Abnormal; Notable for the following:    APPearance CLOUDY (*)    Specific Gravity, Urine  1.034 (*)    All other components within normal limits   Dg Chest 2 View  01/08/2013   *RADIOLOGY REPORT*  Clinical Data: Emesis, coughing of mucus and blood for 2 weeks, history asthma, hypertension  CHEST - 2 VIEW  Comparison: 03/19/1999  Findings:  Upper-normal size of cardiac silhouette. Mediastinal contours and pulmonary vascularity normal. Lungs clear. No pleural effusion or pneumothorax. Bones unremarkable.  IMPRESSION: No acute abnormalities.   Original Report Authenticated By: Lavonia Dana, M.D.   Patient's headache is improved and patient be treated for bronchitis.  She's been stable otherwise here in the emergency department.  Patient was given nebulized breathing treatment here as well.  Patient is advised followup with her primary care Dr. for recheck.    MDM  MDM Reviewed: vitals, nursing note and previous chart Interpretation: labs and x-ray           Brent General, PA-C 01/08/13 1344

## 2013-01-08 NOTE — Progress Notes (Signed)
[facility] has seen patient. Patient had a Purple Card through the [facility]. Patient stated that she seen the doctor at [facility]

## 2013-03-09 ENCOUNTER — Emergency Department (HOSPITAL_COMMUNITY): Payer: Self-pay

## 2013-03-09 ENCOUNTER — Encounter (HOSPITAL_COMMUNITY): Payer: Self-pay | Admitting: Emergency Medicine

## 2013-03-09 ENCOUNTER — Emergency Department (HOSPITAL_COMMUNITY)
Admission: EM | Admit: 2013-03-09 | Discharge: 2013-03-09 | Disposition: A | Discharge status: Home / Self Care | Payer: Self-pay | Attending: Emergency Medicine | Admitting: Emergency Medicine

## 2013-03-09 DIAGNOSIS — S92919A Unspecified fracture of unspecified toe(s), initial encounter for closed fracture: Secondary | ICD-10-CM | POA: Insufficient documentation

## 2013-03-09 DIAGNOSIS — S92911A Unspecified fracture of right toe(s), initial encounter for closed fracture: Secondary | ICD-10-CM

## 2013-03-09 DIAGNOSIS — Y9389 Activity, other specified: Secondary | ICD-10-CM | POA: Insufficient documentation

## 2013-03-09 DIAGNOSIS — Z8679 Personal history of other diseases of the circulatory system: Secondary | ICD-10-CM | POA: Insufficient documentation

## 2013-03-09 DIAGNOSIS — IMO0002 Reserved for concepts with insufficient information to code with codable children: Secondary | ICD-10-CM | POA: Insufficient documentation

## 2013-03-09 DIAGNOSIS — Y929 Unspecified place or not applicable: Secondary | ICD-10-CM | POA: Insufficient documentation

## 2013-03-09 DIAGNOSIS — F319 Bipolar disorder, unspecified: Secondary | ICD-10-CM | POA: Insufficient documentation

## 2013-03-09 DIAGNOSIS — J45909 Unspecified asthma, uncomplicated: Secondary | ICD-10-CM | POA: Insufficient documentation

## 2013-03-09 DIAGNOSIS — W2209XA Striking against other stationary object, initial encounter: Secondary | ICD-10-CM | POA: Insufficient documentation

## 2013-03-09 DIAGNOSIS — Z8709 Personal history of other diseases of the respiratory system: Secondary | ICD-10-CM | POA: Insufficient documentation

## 2013-03-09 DIAGNOSIS — Z79899 Other long term (current) drug therapy: Secondary | ICD-10-CM | POA: Insufficient documentation

## 2013-03-09 DIAGNOSIS — I10 Essential (primary) hypertension: Secondary | ICD-10-CM | POA: Insufficient documentation

## 2013-03-09 HISTORY — DX: Essential (primary) hypertension: I10

## 2013-03-09 MED ORDER — HYDROCODONE-ACETAMINOPHEN 5-325 MG PO TABS: 1.0000 | ORAL_TABLET | ORAL | Status: DC | PRN

## 2013-03-09 MED ORDER — HYDROCODONE-ACETAMINOPHEN 5-325 MG PO TABS
1.0000 | ORAL_TABLET | Freq: Once | ORAL | Status: AC
  Administered 2013-03-09: 1 via ORAL
  Filled 2013-03-09: qty 1

## 2013-03-09 NOTE — ED Notes (Signed)
Pt reports she was walking and hit her toe on a concrete barrier. Pt c/o pain and edema to R fourth toe.

## 2013-03-09 NOTE — ED Notes (Signed)
Pt assisted to bathroom and given ice pack.

## 2013-03-20 NOTE — ED Provider Notes (Signed)
CSN: [id number]     Arrival date & time 03/09/13  1035 History     First MD Initiated Contact with Patient 03/09/13 1143     Chief Complaint  Patient presents with  . Toe Pain   (Consider location/radiation/quality/duration/timing/severity/associated sxs/prior Treatment) HPI Patricia [name] is a 42 y.o. female who presents to the ED with pain in her right 4th toe. She hit her toe on a concrete barrier. She has pain and swelling, she denies any other injuries.   Past Medical History  Diagnosis Date  . Migraines   . Asthma   . Bronchitis   . Bipolar 1 disorder   . Depression   . Hypertension    Past Surgical History  Procedure Laterality Date  . Fluid removal      brain  . Brain surgery    . Fracture surgery    . Tubal ligation     Family History  Problem Relation Age of Onset  . Cancer Other   . Diabetes Other    History  Substance Use Topics  . Smoking status: Never Smoker   . Smokeless tobacco: Never Used  . Alcohol Use: Yes     Comment: occassionally   OB History   Grav Para Term Preterm Abortions TAB SAB Ect Mult Living   5 5 5             Review of Systems  Constitutional: Negative for fever and chills.  HENT: Negative for neck pain.   Respiratory: Negative for shortness of breath.   Gastrointestinal: Negative for nausea and vomiting.  Musculoskeletal:       Right 4th toe pain and swelling  Skin: Negative for wound.  Psychiatric/Behavioral: The patient is not nervous/anxious.     Allergies  Peanut-containing drug products  Home Medications   Current Outpatient Rx  Name  Route  Sig  Dispense  Refill  . acetaminophen-codeine 120-12 MG/5ML solution   Oral   Take 10 mLs by mouth every 4 (four) hours as needed for pain (and cough).   120 mL   0   . albuterol (PROVENTIL HFA;VENTOLIN HFA) 108 (90 BASE) MCG/ACT inhaler   Inhalation   Inhale 2 puffs into the lungs every 6 (six) hours as needed for shortness of breath.          Marland Kitchen albuterol  (PROVENTIL) (2.5 MG/3ML) 0.083% nebulizer solution   Nebulization   Take 2.5 mg by nebulization every 6 (six) hours as needed for shortness of breath.         . ARIPiprazole (ABILIFY) 10 MG tablet   Oral   Take 10 mg by mouth at bedtime.         Marland Kitchen atenolol (TENORMIN) 50 MG tablet   Oral   Take 50 mg by mouth 2 (two) times daily.         Marland Kitchen esomeprazole (NEXIUM) 40 MG capsule   Oral   Take 40 mg by mouth daily before breakfast.         . Guaifenesin 1200 MG TB12   Oral   Take 1 tablet (1,200 mg total) by mouth 2 (two) times daily.   20 each   0   . HYDROcodone-acetaminophen (NORCO/VICODIN) 5-325 MG per tablet   Oral   Take 1 tablet by mouth every 4 (four) hours as needed.   15 tablet   0   . ibuprofen (ADVIL,MOTRIN) 200 MG tablet   Oral   Take 600 mg by mouth every 6 (six) hours  as needed for pain or headache.         . predniSONE (DELTASONE) 50 MG tablet   Oral   Take 1 tablet (50 mg total) by mouth daily.   5 tablet   0   . traZODone (DESYREL) 100 MG tablet   Oral   Take 100 mg by mouth at bedtime.          BP 146/98  Pulse 62  Temp(Src) 97.6 F (36.4 C) (Oral)  Resp 20  Ht 5\' 4"  (1.626 m)  Wt 243 lb (110.224 kg)  BMI 41.69 kg/m2  SpO2 100%  LMP 03/07/2013 Physical Exam  Nursing note and vitals reviewed. Constitutional: She is oriented to person, place, and time. She appears well-developed and well-nourished.  HENT:  Head: Normocephalic.  Eyes: EOM are normal.  Neck: Neck supple.  Cardiovascular: Normal rate.   Pulmonary/Chest: Effort normal.  Musculoskeletal:       Right foot: She exhibits tenderness (over right 4th toe) and swelling. She exhibits no deformity and no laceration.       Feet:  Tender, swollen, pedal pulse strong, adequate circulation. Good touch sensation.  Neurological: She is alert and oriented to person, place, and time. No cranial nerve deficit or sensory deficit.  Skin: Skin is warm and dry.  Psychiatric: She has a  normal mood and affect. Her behavior is normal.    ED Course   Procedures  Labs Reviewed - No data to display No results found. 1. Fracture, toe, right, closed, initial encounter     MDM  42 y.o. female with closed fracture of the right fourth toe. Buddy tape and post op shoe. Ice, elevation and follow up with ortho. Return here as needed. Patient remains neurovascularly intact and is stable for discharge home without any immediate complications.  Discussed with the patient and all questioned fully answered. She will return if any problems arise.   [place], [place] 03/20/13 1734

## 2013-03-22 NOTE — ED Provider Notes (Signed)
Medical screening examination/treatment/procedure(s) were performed by non-physician practitioner and as supervising physician I was immediately available for consultation/collaboration.  Nat Christen, MD 03/22/13 [phone]

## 2013-04-06 ENCOUNTER — Encounter (HOSPITAL_COMMUNITY): Payer: Self-pay | Admitting: *Deleted

## 2013-04-06 ENCOUNTER — Emergency Department (HOSPITAL_COMMUNITY)
Admission: EM | Admit: 2013-04-06 | Discharge: 2013-04-06 | Disposition: A | Discharge status: Home / Self Care | Payer: Self-pay | Attending: Emergency Medicine | Admitting: Emergency Medicine

## 2013-04-06 DIAGNOSIS — J45909 Unspecified asthma, uncomplicated: Secondary | ICD-10-CM | POA: Insufficient documentation

## 2013-04-06 DIAGNOSIS — IMO0002 Reserved for concepts with insufficient information to code with codable children: Secondary | ICD-10-CM | POA: Insufficient documentation

## 2013-04-06 DIAGNOSIS — Z79899 Other long term (current) drug therapy: Secondary | ICD-10-CM | POA: Insufficient documentation

## 2013-04-06 DIAGNOSIS — F319 Bipolar disorder, unspecified: Secondary | ICD-10-CM | POA: Insufficient documentation

## 2013-04-06 DIAGNOSIS — R404 Transient alteration of awareness: Secondary | ICD-10-CM | POA: Insufficient documentation

## 2013-04-06 DIAGNOSIS — I10 Essential (primary) hypertension: Secondary | ICD-10-CM | POA: Insufficient documentation

## 2013-04-06 DIAGNOSIS — R42 Dizziness and giddiness: Secondary | ICD-10-CM | POA: Insufficient documentation

## 2013-04-06 DIAGNOSIS — G43909 Migraine, unspecified, not intractable, without status migrainosus: Secondary | ICD-10-CM | POA: Insufficient documentation

## 2013-04-06 DIAGNOSIS — R112 Nausea with vomiting, unspecified: Secondary | ICD-10-CM | POA: Insufficient documentation

## 2013-04-06 DIAGNOSIS — Z9889 Other specified postprocedural states: Secondary | ICD-10-CM | POA: Insufficient documentation

## 2013-04-06 MED ORDER — SODIUM CHLORIDE 0.9 % IV BOLUS (SEPSIS)
1000.0000 mL | Freq: Once | INTRAVENOUS | Status: AC
  Administered 2013-04-06: 1000 mL via INTRAVENOUS

## 2013-04-06 MED ORDER — HYDROMORPHONE HCL PF 1 MG/ML IJ SOLN
1.0000 mg | Freq: Once | INTRAMUSCULAR | Status: AC
  Administered 2013-04-06: 1 mg via INTRAVENOUS
  Filled 2013-04-06: qty 1

## 2013-04-06 MED ORDER — DEXAMETHASONE SODIUM PHOSPHATE 10 MG/ML IJ SOLN
10.0000 mg | Freq: Once | INTRAMUSCULAR | Status: AC
  Administered 2013-04-06: 10 mg via INTRAVENOUS
  Filled 2013-04-06: qty 1

## 2013-04-06 MED ORDER — KETOROLAC TROMETHAMINE 30 MG/ML IJ SOLN
30.0000 mg | Freq: Once | INTRAMUSCULAR | Status: AC
  Administered 2013-04-06: 30 mg via INTRAVENOUS
  Filled 2013-04-06: qty 1

## 2013-04-06 MED ORDER — ONDANSETRON HCL 4 MG/2ML IJ SOLN
4.0000 mg | Freq: Once | INTRAMUSCULAR | Status: AC
  Administered 2013-04-06: 4 mg via INTRAVENOUS
  Filled 2013-04-06: qty 2

## 2013-04-06 MED ORDER — METOCLOPRAMIDE HCL 5 MG/ML IJ SOLN
10.0000 mg | Freq: Once | INTRAMUSCULAR | Status: AC
  Administered 2013-04-06: 10 mg via INTRAVENOUS
  Filled 2013-04-06: qty 2

## 2013-04-06 MED ORDER — DIPHENHYDRAMINE HCL 50 MG/ML IJ SOLN
25.0000 mg | Freq: Once | INTRAMUSCULAR | Status: AC
  Administered 2013-04-06: 25 mg via INTRAVENOUS
  Filled 2013-04-06: qty 1

## 2013-04-06 NOTE — ED Provider Notes (Signed)
CSN: [id number]     Arrival date & time 04/06/13  1334 History  This chart was scribed for Osvaldo Shipper, MD by Ivar Drape, ED Scribe. This patient was seen in room APA03/APA03 and the patient's care was started 3:00 PM.    Chief Complaint  Patient presents with  . Migraine   Patient is a 42 y.o. female presenting with migraines. The history is provided by the patient. No language interpreter was used.  Migraine This is a chronic problem. The current episode started yesterday. The problem has not changed since onset.Associated symptoms include headaches. The symptoms are aggravated by walking (Light and sound). Nothing relieves the symptoms. Treatments tried: Prescription Imitrex. The treatment provided no relief.   HPI Comments: Patricia [name] is a 42 y.o. female with h/o migraines who presents to the Emergency Department complaining of a gradual onset, severe frontal migraine that radiates to her left eye that began 3 days ago. She describes the pain as "throbbing" and "stabbing". Pt reports emesis, lightheadedness, sensitivity to light, sensitivity to sound, and nausea as associated symptoms. She states that she usually has LOC with her migraines, however she did not experience LOC today. Pt states she has taken prescription Imitrex with no relief. The pt reports that her migraines have been relieved by Dilaudid in the past, and that migraine cocktails only work some of the time.  Pt does not currently have a PCP.  Past Medical History  Diagnosis Date  . Migraines   . Asthma   . Bronchitis   . Bipolar 1 disorder   . Depression   . Hypertension    Past Surgical History  Procedure Laterality Date  . Fluid removal      brain  . Brain surgery    . Fracture surgery    . Tubal ligation     Family History  Problem Relation Age of Onset  . Cancer Other   . Diabetes Other    History  Substance Use Topics  . Smoking status: Never Smoker   . Smokeless tobacco: Never  Used  . Alcohol Use: Yes     Comment: occassionally   OB History   Grav Para Term Preterm Abortions TAB SAB Ect Mult Living   5 5 5             Review of Systems  Eyes: Positive for photophobia.  Gastrointestinal: Positive for nausea and vomiting.  Neurological: Positive for light-headedness and headaches. Negative for syncope.  All other systems reviewed and are negative.   Allergies  Peanut-containing drug products  Home Medications   Current Outpatient Rx  Name  Route  Sig  Dispense  Refill  . acetaminophen-codeine 120-12 MG/5ML solution   Oral   Take 10 mLs by mouth every 4 (four) hours as needed for pain (and cough).   120 mL   0   . albuterol (PROVENTIL HFA;VENTOLIN HFA) 108 (90 BASE) MCG/ACT inhaler   Inhalation   Inhale 2 puffs into the lungs every 6 (six) hours as needed for shortness of breath.          Marland Kitchen albuterol (PROVENTIL) (2.5 MG/3ML) 0.083% nebulizer solution   Nebulization   Take 2.5 mg by nebulization every 6 (six) hours as needed for shortness of breath.         . ARIPiprazole (ABILIFY) 10 MG tablet   Oral   Take 10 mg by mouth at bedtime.         Marland Kitchen atenolol (TENORMIN) 50 MG tablet  Oral   Take 50 mg by mouth 2 (two) times daily.         Marland Kitchen esomeprazole (NEXIUM) 40 MG capsule   Oral   Take 40 mg by mouth daily before breakfast.         . Guaifenesin 1200 MG TB12   Oral   Take 1 tablet (1,200 mg total) by mouth 2 (two) times daily.   20 each   0   . HYDROcodone-acetaminophen (NORCO/VICODIN) 5-325 MG per tablet   Oral   Take 1 tablet by mouth every 4 (four) hours as needed.   15 tablet   0   . ibuprofen (ADVIL,MOTRIN) 200 MG tablet   Oral   Take 600 mg by mouth every 6 (six) hours as needed for pain or headache.         . predniSONE (DELTASONE) 50 MG tablet   Oral   Take 1 tablet (50 mg total) by mouth daily.   5 tablet   0   . traZODone (DESYREL) 100 MG tablet   Oral   Take 100 mg by mouth at bedtime.           Triage Vitals: BP 145/93  Pulse 60  Temp(Src) 98.3 F (36.8 C) (Oral)  Resp 20  SpO2 100%  LMP 02/28/2013  Physical Exam  Nursing note and vitals reviewed. Constitutional: She is oriented to person, place, and time. She appears well-developed and well-nourished. No distress.  HENT:  Head: Normocephalic and atraumatic.  Eyes: EOM are normal.  Neck: Neck supple. No tracheal deviation present.  Cardiovascular: Normal rate.   Pulmonary/Chest: Effort normal. No respiratory distress.  Musculoskeletal: Normal range of motion.  Neurological: She is alert and oriented to person, place, and time. She has normal strength. No cranial nerve deficit. She displays a negative Romberg sign. Coordination and gait normal.  Skin: Skin is warm and dry.  Psychiatric: She has a normal mood and affect. Her behavior is normal.    ED Course  Procedures (including critical care time)  DIAGNOSTIC STUDIES: Oxygen Saturation is 100% on room air, normal by my interpretation.    COORDINATION OF CARE: 3:04 PM-Ordered Reglan, Benadryl, Decadron, Toradol, and Zofran. Discussed treatment plan with pt at bedside and pt agreed to plan.   Medications  metoCLOPramide (REGLAN) injection 10 mg (10 mg Intravenous Given 04/06/13 1523)  diphenhydrAMINE (BENADRYL) injection 25 mg (25 mg Intravenous Given 04/06/13 1526)  dexamethasone (DECADRON) injection 10 mg (10 mg Intravenous Given 04/06/13 1538)  ketorolac (TORADOL) 30 MG/ML injection 30 mg (30 mg Intravenous Given 04/06/13 1530)  sodium chloride 0.9 % bolus 1,000 mL (1,000 mLs Intravenous New Bag/Given 04/06/13 1533)  ondansetron (ZOFRAN) injection 4 mg (4 mg Intravenous Given 04/06/13 1521)   Labs Review Labs Reviewed - No data to display Imaging Review No results found.  MDM   1. Migraine    14F with hx of migraines presents with migraine. Denies fevers, nausea, vomiting. Hx of syncope with migraine. Near syncope earlier today. No neurologic symptoms otherwise.  Gradual onset. AFVSS here. Normal neuro exam. Normal gait, normal CN, normal strength, sensation, coordination.  Migraine cocktail and one dose of dilaudid with resolution of headache. Stable for discharge.     I personally performed the services described in this documentation, which was scribed in my presence. The recorded information has been reviewed and is accurate.     Osvaldo Shipper, MD 04/06/13 1944

## 2013-04-06 NOTE — ED Notes (Signed)
Ambulated to restroom with steady gait.

## 2013-04-06 NOTE — ED Notes (Signed)
Pt c/o migraine headache with n/v that started three days ago, has taken Imitrex with no relief in pain, pt repeated multiple times in triage that she requires an iv for her headaches. Comfort measures provided,

## 2013-06-04 ENCOUNTER — Emergency Department (HOSPITAL_COMMUNITY)
Admission: EM | Admit: 2013-06-04 | Discharge: 2013-06-04 | Disposition: A | Discharge status: Home / Self Care | Payer: Self-pay | Attending: Emergency Medicine | Admitting: Emergency Medicine

## 2013-06-04 ENCOUNTER — Encounter (HOSPITAL_COMMUNITY): Payer: Self-pay | Admitting: Emergency Medicine

## 2013-06-04 DIAGNOSIS — Z79899 Other long term (current) drug therapy: Secondary | ICD-10-CM | POA: Insufficient documentation

## 2013-06-04 DIAGNOSIS — G43909 Migraine, unspecified, not intractable, without status migrainosus: Secondary | ICD-10-CM | POA: Insufficient documentation

## 2013-06-04 DIAGNOSIS — J45909 Unspecified asthma, uncomplicated: Secondary | ICD-10-CM | POA: Insufficient documentation

## 2013-06-04 DIAGNOSIS — I10 Essential (primary) hypertension: Secondary | ICD-10-CM | POA: Insufficient documentation

## 2013-06-04 DIAGNOSIS — F3130 Bipolar disorder, current episode depressed, mild or moderate severity, unspecified: Secondary | ICD-10-CM | POA: Insufficient documentation

## 2013-06-04 MED ORDER — DEXAMETHASONE SODIUM PHOSPHATE 10 MG/ML IJ SOLN
10.0000 mg | Freq: Once | INTRAMUSCULAR | Status: AC
  Administered 2013-06-04: 10 mg via INTRAVENOUS
  Filled 2013-06-04: qty 1

## 2013-06-04 MED ORDER — SODIUM CHLORIDE 0.9 % IV BOLUS (SEPSIS)
1000.0000 mL | Freq: Once | INTRAVENOUS | Status: AC
  Administered 2013-06-04: 1000 mL via INTRAVENOUS

## 2013-06-04 MED ORDER — NAPROXEN 500 MG PO TABS: 500.0000 mg | ORAL_TABLET | Freq: Two times a day (BID) | ORAL | Status: DC

## 2013-06-04 MED ORDER — KETOROLAC TROMETHAMINE 30 MG/ML IJ SOLN
30.0000 mg | Freq: Once | INTRAMUSCULAR | Status: AC
  Administered 2013-06-04: 30 mg via INTRAVENOUS
  Filled 2013-06-04: qty 1

## 2013-06-04 MED ORDER — PROCHLORPERAZINE EDISYLATE 5 MG/ML IJ SOLN
10.0000 mg | Freq: Once | INTRAMUSCULAR | Status: AC
  Administered 2013-06-04: 10 mg via INTRAVENOUS
  Filled 2013-06-04: qty 2

## 2013-06-04 NOTE — ED Notes (Signed)
Pt from home reports that she has had migraine x3 days with N/V/D. Pt cannot afford her Imitrex that was prescribed. Pt is A&O and in NAD

## 2013-06-04 NOTE — ED Provider Notes (Signed)
TIME SEEN: 9:04 AM  CHIEF COMPLAINT: migraine  HPI: Patient is a 42 y.o. female with a history of bipolar disorder, hypertension, asthma and chronic migraine headaches who presents emergency department with her typical left-sided, throbbing, sharp, gradual onset headache. She reports his been present for the past 3 days. She has run out of Imitrex and is unable to afford her prescription and has not been taking this medication. Denies any head injury. No fever, neck pain or neck stiffness. No numbness, tingling or focal weakness. She does have some mild blurry vision in the left eye that she states is typical of her migraine headaches. Headache is worse with lights and sounds, she has nausea. States normally Imitrex helps with her headaches and sometimes GI cocktails help but usually this Percocet and Dilaudid that helped the most.  ROS: See HPI Constitutional: no fever  Eyes: no drainage  ENT: no runny nose   Cardiovascular:  no chest pain  Resp: no SOB  GI: no vomiting GU: no dysuria Integumentary: no rash  Allergy: no hives  Musculoskeletal: no leg swelling  Neurological: no slurred speech ROS otherwise negative  PAST MEDICAL HISTORY/PAST SURGICAL HISTORY:  Past Medical History  Diagnosis Date  . Migraines   . Asthma   . Bronchitis   . Bipolar 1 disorder   . Depression   . Hypertension     MEDICATIONS:  Prior to Admission medications   Medication Sig Start Date End Date Taking? Authorizing Provider  albuterol (PROVENTIL HFA;VENTOLIN HFA) 108 (90 BASE) MCG/ACT inhaler Inhale 2 puffs into the lungs every 6 (six) hours as needed for shortness of breath.     Historical Provider, MD  albuterol (PROVENTIL) (2.5 MG/3ML) 0.083% nebulizer solution Take 2.5 mg by nebulization every 6 (six) hours as needed for shortness of breath.    Historical Provider, MD  ARIPiprazole (ABILIFY) 10 MG tablet Take 10 mg by mouth at bedtime.    Historical Provider, MD  atenolol (TENORMIN) 50 MG tablet  Take 50 mg by mouth 2 (two) times daily.    Historical Provider, MD  esomeprazole (NEXIUM) 40 MG capsule Take 40 mg by mouth daily before breakfast.    Historical Provider, MD  SUMAtriptan (IMITREX) 50 MG tablet Take 50 mg by mouth every 2 (two) hours as needed for migraine.    Historical Provider, MD  traZODone (DESYREL) 100 MG tablet Take 100 mg by mouth at bedtime.    Historical Provider, MD    ALLERGIES:  Allergies  Allergen Reactions  . Peanut-Containing Drug Products Anaphylaxis    Peanut Oil Only    SOCIAL HISTORY:  History  Substance Use Topics  . Smoking status: Never Smoker   . Smokeless tobacco: Never Used  . Alcohol Use: Yes     Comment: occassionally    FAMILY HISTORY: Family History  Problem Relation Age of Onset  . Cancer Other   . Diabetes Other     EXAM: BP 127/87  Pulse 78  Temp(Src) 98.6 F (37 C) (Oral)  Resp 17  SpO2 100%  LMP 05/21/2013 CONSTITUTIONAL: Alert and oriented and responds appropriately to questions. Well-appearing; well-nourished HEAD: Normocephalic, atraumatic EYES: Conjunctivae clear, PERRL, extraocular movements intact ENT: normal nose; no rhinorrhea; moist mucous membranes; pharynx without lesions noted NECK: Supple, no meningismus, no LAD  CARD: RRR; S1 and S2 appreciated; no murmurs, no clicks, no rubs, no gallops RESP: Normal chest excursion without splinting or tachypnea; breath sounds clear and equal bilaterally; no wheezes, no rhonchi, no rales,  ABD/GI:  Normal bowel sounds; non-distended; soft, non-tender, no rebound, no guarding BACK:  The back appears normal and is non-tender to palpation, there is no CVA tenderness EXT: Normal ROM in all joints; non-tender to palpation; no edema; normal capillary refill; no cyanosis    SKIN: Normal color for age and race; warm NEURO: Moves all extremities equally, strength 5/5 in all 4 extremity, sensation to light touch intact diffusely, cranial nerves II through XII intact, normal  gait PSYCH: The patient's mood and manner are appropriate. Grooming and personal hygiene are appropriate.  MEDICAL DECISION MAKING: Patient here with her typical migraine headache. She is neurologically intact. No concern for any infectious symptoms. No history of trauma. I am not concerned for any life-threatening illness. Will give migraine cocktail and reassess.  Have discussed with patient at length that I do not provide narcotics for migraine headaches given that literature shows this makes headaches more frequent, difficult to treat, causes rebound headaches.  Discussed with patient that we have multiple medications that we can try to control her pain. Also discussed with patient I would not be discharging her home with Percocet as she has requested. Have discussed with patient that if she feels she needs these medications to control her chronic migraine headaches that she needs to discuss this with her primary care physician. She states that she will need something on the [facility] $4 list as this is all that she can afford.  Have shown patient the $4 [facility] list that includes ibuprofen, naproxen and Mobic for pain relief. I have given patient time to verbalize her concerns. Patient is upset that I will not be providing her with narcotics today but again I have offered her multiple other medications for pain relief but I do not feel opioids are indicated at this time.  ED PROGRESS: Patient reports her headache is now 4/10 from a 10/10. She feels like her headache is manageable she does not require any further medications at this time. Patient has apologized for being upset earlier. She reports that she understands the plan and is comfortable with it. She is ready for discharge home. We'll discharge with prescription for naproxen as this is on the $4 [facility] list. Have discussed with patient at length that she needs to followup with her primary care physician and may need to be on long-term medications  to control her chronic headaches. Given usual and customary return precautions. She verbalized understanding and is comfortable with plan.     [place], DO 06/04/13 1113

## 2013-06-04 NOTE — Progress Notes (Signed)
[facility] provided pt with a list of primary care resources and a [facility] [employer] Card application.  °

## 2013-08-30 ENCOUNTER — Emergency Department (HOSPITAL_COMMUNITY)
Admission: EM | Admit: 2013-08-30 | Discharge: 2013-08-30 | Disposition: A | Discharge status: Home / Self Care | Payer: Self-pay | Attending: Emergency Medicine | Admitting: Emergency Medicine

## 2013-08-30 DIAGNOSIS — K921 Melena: Secondary | ICD-10-CM | POA: Insufficient documentation

## 2013-08-30 DIAGNOSIS — I10 Essential (primary) hypertension: Secondary | ICD-10-CM | POA: Insufficient documentation

## 2013-08-30 DIAGNOSIS — G43909 Migraine, unspecified, not intractable, without status migrainosus: Secondary | ICD-10-CM | POA: Insufficient documentation

## 2013-08-30 DIAGNOSIS — K625 Hemorrhage of anus and rectum: Secondary | ICD-10-CM | POA: Insufficient documentation

## 2013-08-30 DIAGNOSIS — F3130 Bipolar disorder, current episode depressed, mild or moderate severity, unspecified: Secondary | ICD-10-CM | POA: Insufficient documentation

## 2013-08-30 DIAGNOSIS — R197 Diarrhea, unspecified: Secondary | ICD-10-CM | POA: Insufficient documentation

## 2013-08-30 DIAGNOSIS — Z79899 Other long term (current) drug therapy: Secondary | ICD-10-CM | POA: Insufficient documentation

## 2013-08-30 DIAGNOSIS — Z3202 Encounter for pregnancy test, result negative: Secondary | ICD-10-CM | POA: Insufficient documentation

## 2013-08-30 DIAGNOSIS — J45909 Unspecified asthma, uncomplicated: Secondary | ICD-10-CM | POA: Insufficient documentation

## 2013-08-30 LAB — URINALYSIS, ROUTINE W REFLEX MICROSCOPIC
Bilirubin Urine: NEGATIVE
Glucose, UA: NEGATIVE mg/dL
Ketones, ur: NEGATIVE mg/dL
Leukocytes, UA: NEGATIVE
NITRITE: NEGATIVE
Protein, ur: NEGATIVE mg/dL
SPECIFIC GRAVITY, URINE: 1.028 (ref 1.005–1.030)
Urobilinogen, UA: 0.2 mg/dL (ref 0.0–1.0)
pH: 5 (ref 5.0–8.0)

## 2013-08-30 LAB — URINE MICROSCOPIC-ADD ON

## 2013-08-30 LAB — OCCULT BLOOD, POC DEVICE: FECAL OCCULT BLD: POSITIVE — AB

## 2013-08-30 LAB — POCT PREGNANCY, URINE: Preg Test, Ur: NEGATIVE

## 2013-08-30 MED ORDER — OXYCODONE-ACETAMINOPHEN 5-325 MG PO TABS
2.0000 | ORAL_TABLET | Freq: Once | ORAL | Status: AC
Start: 2013-08-30 — End: 2013-08-30
  Administered 2013-08-30: 2 via ORAL
  Filled 2013-08-30: qty 2

## 2013-08-30 MED ORDER — HYDROCORTISONE ACETATE 25 MG RE SUPP
25.0000 mg | Freq: Once | RECTAL | Status: AC
  Administered 2013-08-30: 25 mg via RECTAL
  Filled 2013-08-30: qty 1

## 2013-08-30 MED ORDER — OXYCODONE-ACETAMINOPHEN 5-325 MG PO TABS: 1.0000 | ORAL_TABLET | ORAL | Status: DC | PRN

## 2013-08-30 MED ORDER — OXYCODONE-ACETAMINOPHEN 5-325 MG PO TABS
2.0000 | ORAL_TABLET | Freq: Once | ORAL | Status: AC
  Administered 2013-08-30: 2 via ORAL
  Filled 2013-08-30: qty 2

## 2013-08-30 MED ORDER — HYDROCORTISONE ACETATE 25 MG RE SUPP: 25.0000 mg | Freq: Two times a day (BID) | RECTAL | Status: DC

## 2013-08-30 NOTE — ED Notes (Signed)
MD at bedside. 

## 2013-08-30 NOTE — Discharge Instructions (Signed)
See the doctor of your choice if not better in 3-4 days. Return here if needed for problems.    Bloody Stools Bloody stools often mean that there is a problem in the digestive tract. Your caregiver may use the term "melena" to describe black, tarry, and bad smelling stools or "hematochezia" to describe red or maroon-colored stools. Blood seen in the stool can be caused by bleeding anywhere along the intestinal tract.  A black stool usually means that blood is coming from the upper part of the gastrointestinal tract (esophagus, stomach, or small bowel). Passing maroon-colored stools or bright red blood usually means that blood is coming from lower down in the large bowel or the rectum. However, sometimes massive bleeding in the stomach or small intestine can cause bright red bloody stools.  Consuming black licorice, lead, iron pills, medicines containing bismuth subsalicylate, or blueberries can also cause black stools. Your caregiver can test black stools to see if blood is present. It is important that the cause of the bleeding be found. Treatment can then be started, and the problem can be corrected. Rectal bleeding may not be serious, but you should not assume everything is okay until you know the cause.It is very important to follow up with your caregiver or a specialist in gastrointestinal problems. CAUSES  Blood in the stools can come from various underlying causes.Often, the cause is not found during your first visit. Testing is often needed to discover the cause of bleeding in the gastrointestinal tract. Causes range from simple to serious or even life-threatening.Possible causes include:  Hemorrhoids.These are veins that are full of blood (engorged) in the rectum. They cause pain, inflammation, and may bleed.  Anal fissures.These are areas of painful tearing which may bleed. They are often caused by passing hard stool.  Diverticulosis.These are pouches that form on the colon over  time, with age, and may bleed significantly.  Diverticulitis.This is inflammation in areas with diverticulosis. It can cause pain, fever, and bloody stools, although bleeding is rare.  Proctitis and colitis. These are inflamed areas of the rectum or colon. They may cause pain, fever, and bloody stools.  Polyps and cancer. Colon cancer is a leading cause of preventable cancer death.It often starts out as precancerous polyps that can be removed during a colonoscopy, preventing progression into cancer. Sometimes, polyps and cancer may cause rectal bleeding.  Gastritis and ulcers.Bleeding from the upper gastrointestinal tract (near the stomach) may travel through the intestines and produce black, sometimes tarry, often bad smelling stools. In certain cases, if the bleeding is fast enough, the stools may not be black, but red and the condition may be life-threatening. SYMPTOMS  You may have stools that are bright red and bloody, that are normal color with blood on them, or that are dark black and tarry. In some cases, you may only have blood in the toilet bowl. Any of these cases need medical care. You may also have:  Pain at the anus or anywhere in the rectum.  Lightheadedness or feeling faint.  Extreme weakness.  Nausea or vomiting.  Fever. DIAGNOSIS Your caregiver may use the following methods to find the cause of your bleeding:  Taking a medical history. Age is important. Older people tend to develop polyps and cancer more often. If there is anal pain and a hard, large stool associated with bleeding, a tear of the anus may be the cause. If blood drips into the toilet after a bowel movement, bleeding hemorrhoids may be the problem. The  color and frequency of the bleeding are additional considerations. In most cases, the medical history provides clues, but seldom the final answer.  A visual and finger (digital) exam. Your caregiver will inspect the anal area, looking for tears and  hemorrhoids. A finger exam can provide information when there is tenderness or a growth inside. In men, the prostate is also examined.  Endoscopy. Several types of small, long scopes (endoscopes) are used to view the colon.  In the office, your caregiver may use a rigid, or more commonly, a flexible viewing sigmoidoscope. This exam is called flexible sigmoidoscopy. It is performed in 5 to 10 minutes.  A more thorough exam is accomplished with a colonoscope. It allows your caregiver to view the entire 5 to 6 foot long colon. Medicine to help you relax (sedative) is usually given for this exam. Frequently, a bleeding lesion may be present beyond the reach of the sigmoidoscope. So, a colonoscopy may be the best exam to start with. Both exams are usually done on an outpatient basis. This means the patient does not stay overnight in the hospital or surgery center.  An upper endoscopy may be needed to examine your stomach. Sedation is used and a flexible endoscope is put in your mouth, down to your stomach.  A barium enema X-ray. This is an X-ray exam. It uses liquid barium inserted by enema into the rectum. This test alone may not identify an actual bleeding point. X-rays highlight abnormal shadows, such as those made by lumps (tumors), diverticuli, or colitis. TREATMENT  Treatment depends on the cause of your bleeding.   For bleeding from the stomach or colon, the caregiver doing your endoscopy or colonoscopy may be able to stop the bleeding as part of the procedure.  Inflammation or infection of the colon can be treated with medicines.  Many rectal problems can be treated with creams, suppositories, or warm baths.  Surgery is sometimes needed.  Blood transfusions are sometimes needed if you have lost a lot of blood.  For any bleeding problem, let your caregiver know if you take aspirin or other blood thinners regularly. HOME CARE INSTRUCTIONS   Take any medicines exactly as  prescribed.  Keep your stools soft by eating a diet high in fiber. Prunes (1 to 3 a day) work well for many people.  Drink enough water and fluids to keep your urine clear or pale yellow.  Take sitz baths if advised. A sitz bath is when you sit in a bathtub with warm water for 10 to 15 minutes to soak, soothe, and cleanse the rectal area.  If enemas or suppositories are advised, be sure you know how to use them. Tell your caregiver if you have problems with this.  Monitor your bowel movements to look for signs of improvement or worsening. SEEK MEDICAL CARE IF:   You do not improve in the time expected.  Your condition worsens after initial improvement.  You develop any new symptoms. SEEK IMMEDIATE MEDICAL CARE IF:   You develop severe or prolonged rectal bleeding.  You vomit blood.  You feel weak or faint.  You have a fever. MAKE SURE YOU:  Understand these instructions.  Will watch your condition.  Will get help right away if you are not doing well or get worse. Document Released: 07/05/2002 Document Revised: 10/07/2011 Document Reviewed: 11/30/2010 [facility] Patient Information 2014 [place], [place].  Diarrhea Diarrhea is frequent loose and watery bowel movements. It can cause you to feel weak and dehydrated. Dehydration can  cause you to become tired and thirsty, have a dry mouth, and have decreased urination that often is dark yellow. Diarrhea is a sign of another problem, most often an infection that will not last long. In most cases, diarrhea typically lasts 2 3 days. However, it can last longer if it is a sign of something more serious. It is important to treat your diarrhea as directed by your caregive to lessen or prevent future episodes of diarrhea. CAUSES  Some common causes include:  Gastrointestinal infections caused by viruses, bacteria, or parasites.  Food poisoning or food allergies.  Certain medicines, such as antibiotics, chemotherapy, and  laxatives.  Artificial sweeteners and fructose.  Digestive disorders. HOME CARE INSTRUCTIONS  Ensure adequate fluid intake (hydration): have 1 cup (8 oz) of fluid for each diarrhea episode. Avoid fluids that contain simple sugars or sports drinks, fruit juices, whole milk products, and sodas. Your urine should be clear or pale yellow if you are drinking enough fluids. Hydrate with an oral rehydration solution that you can purchase at pharmacies, retail stores, and online. You can prepare an oral rehydration solution at home by mixing the following ingredients together:    tsp table salt.   tsp baking soda.   tsp salt substitute containing potassium chloride.  1  tablespoons sugar.  1 L (34 oz) of water.  Certain foods and beverages may increase the speed at which food moves through the gastrointestinal (GI) tract. These foods and beverages should be avoided and include:  Caffeinated and alcoholic beverages.  High-fiber foods, such as raw fruits and vegetables, nuts, seeds, and whole grain breads and cereals.  Foods and beverages sweetened with sugar alcohols, such as xylitol, sorbitol, and mannitol.  Some foods may be well tolerated and may help thicken stool including:  Starchy foods, such as rice, toast, pasta, low-sugar cereal, oatmeal, grits, baked potatoes, crackers, and bagels.  Bananas.  Applesauce.  Add probiotic-rich foods to help increase healthy bacteria in the GI tract, such as yogurt and fermented milk products.  Wash your hands well after each diarrhea episode.  Only take over-the-counter or prescription medicines as directed by your caregiver.  Take a warm bath to relieve any burning or pain from frequent diarrhea episodes. SEEK IMMEDIATE MEDICAL CARE IF:   You are unable to keep fluids down.  You have persistent vomiting.  You have blood in your stool, or your stools are black and tarry.  You do not urinate in 6 8 hours, or there is only a small  amount of very dark urine.  You have abdominal pain that increases or localizes.  You have weakness, dizziness, confusion, or lightheadedness.  You have a severe headache.  Your diarrhea gets worse or does not get better.  You have a fever or persistent symptoms for more than 2 3 days.  You have a fever and your symptoms suddenly get worse. MAKE SURE YOU:   Understand these instructions.  Will watch your condition.  Will get help right away if you are not doing well or get worse. Document Released: 07/05/2002 Document Revised: 07/01/2012 Document Reviewed: 03/22/2012 [facility] Patient Information 2014 [place], [place].   Emergency Department Resource Guide 1) Find a Doctor and Pay Out of Pocket Although you won't have to find out who is covered by your insurance plan, it is a good idea to ask around and get recommendations. You will then need to call the office and see if the doctor you have chosen will accept you as a new  patient and what types of options they offer for patients who are self-pay. Some doctors offer discounts or will set up payment plans for their patients who do not have insurance, but you will need to ask so you aren't surprised when you get to your appointment.  2) Contact Your Local Health Department Not all health departments have doctors that can see patients for sick visits, but many do, so it is worth a call to see if yours does. If you don't know where your local health department is, you can check in your phone book. The CDC also has a tool to help you locate your state's health department, and many state websites also have listings of all of their local health departments.  3) Find a [facility] If your illness is not likely to be very severe or complicated, you may want to try a walk in clinic. These are popping up all over the country in pharmacies, drugstores, and shopping centers. They're usually staffed by nurse practitioners or physician assistants  that have been trained to treat common illnesses and complaints. They're usually fairly quick and inexpensive. However, if you have serious medical issues or chronic medical problems, these are probably not your best option.  No Primary Care Doctor: - Call Health Connect at  [phone] - they can help you locate a primary care doctor that  accepts your insurance, provides certain services, etc. - Physician Referral Service- [phone]  Chronic Pain Problems: Organization         Address  Phone   Notes  [facility]  [phone] Patients need to be referred by their primary care doctor.   Medication Assistance: Organization         Address  Phone   Notes  [facility] Medication [facility] [address] [place], [place] [address] [phone] --Must be a resident of [facility] -- Must have NO insurance coverage whatsoever (no Medicaid/ Medicare, etc.) -- The pt. MUST have a primary care doctor that directs their care regularly and follows them in the community   MedAssist  [phone]   [employer]  [phone]    Agencies that provide inexpensive medical care: Organization         Address  Phone   Notes  [place]  [phone]   [facility]    [phone]   [facility], [place] [address] [phone]   [place] [address] [place] [phone]   Planned Parenthood    [phone]   [facility]    [phone]   [address], [place] Phone:  [phone], Fax:  [phone] Hours of Operation:  9 am - 6 pm, M-F.  Also accepts Medicaid/Medicare and self-pay.  [facility] [address] for [place], [address], [place] Phone: [phone], Fax: [phone]. Hours of Operation:  8:30 am - 5:30 pm, M-F.  Also accepts Medicaid  and self-pay.  [facility] [place] [address], [place] Phone: [phone]   [place], [place], [place] [phone], Ext. [phone] Mondays & Thursdays: 7-9 AM.  First 15 patients are seen on a first come, first serve basis.    [place] Providers:  Organization         Address  Phone   Notes  [facility] [address], [place] [phone] Also accepts self-pay patients.  [facility] [id number] [place], [place]  [phone]   [place], [address], [place] [phone]   [facility] [address] [place] [phone]   Lucianne Lei [address], [place]   [phone] Only accepts [place] Access [place] patients after they have their name applied to their card.   Self-Pay (no insurance) in [facility] [place]:  Organization         Address  Phone   Notes  Sickle Cell Patients, [facility] [place] [phone]   [facility] [place] [phone]   [facility] [place]  [place], [place], [place] [phone]   [facility]/Dr. Osei-Bonsu  [address], [place] or [address], [place] [phone] Phone number for both [place] and [place] locations is the same.  [facility] [address], [place] [phone]   [facility] [address], [place] or [address] [phone]   [facility] [address] [place] [phone], phone; [phone], fax Sees patients 1st and 3rd Saturday of every month.  Must not qualify for public or private insurance (i.e. Medicaid, Medicare, [place] Health Choice, Veterans' Benefits)  Household income  should be no more than 200% of the poverty level The clinic cannot treat you if you are pregnant or think you are pregnant  Sexually transmitted diseases are not treated at the clinic.    Dental Care: Organization         Address  Phone  Notes  [facility] [place] [phone] Accepts children up to age 55 who are enrolled in [place] or [place]; pregnant women with a Medicaid card; and children who have applied for Medicaid or [place] Health Choice, but were declined, whose parents can pay a reduced fee at time of service.  [facility] Department of [facility]  [address], [place] [phone] Accepts children up to age 68 who are enrolled in [place] or [place]; pregnant women with a Medicaid card; and children who have applied for Medicaid or [place] Health Choice, but were declined, whose parents can pay a reduced fee at time of service.  [place] Adult Dental Access PROGRAM  [facility] [phone] Patients are seen by appointment only. Walk-ins are not accepted. [place] will see patients 75 years of age and older. Monday - Tuesday (8am-5pm) Most Wednesdays (8:30-5pm) $30 per visit, cash only  [facility] Adult Dental Access PROGRAM  [address], [facility] [phone] Patients are seen by appointment only. Walk-ins are not accepted. [place] will see patients 35 years of age and older. One Wednesday Evening (Monthly: Volunteer Based).  $30 per visit, cash only  [place]  [phone] for adults; Children under age 58, call Graduate Pediatric Dentistry at [phone]. Children aged 64-14, please call [phone] to request a pediatric application.  Dental services are provided in all areas of dental care including fillings, crowns and  bridges, complete and partial dentures, implants, gum treatment,  root canals, and extractions. Preventive care is also provided. Treatment is provided to both adults and children. Patients are selected via a lottery and there is often a waiting list.   [facility] [address] [place]  [phone] [email or web address]   [facility] [address] [place], [place] [phone], Ext. [phone] Second and Fourth Thursday of each month, opens at 6:30 AM; Clinic ends at 9 AM.  Patients are seen on a first-come first-served basis, and a limited number are seen during each clinic.   [facility]  [address] Hillard Danker [place], [place] [phone]   Eligibility Requirements You must have lived in [place], [place], or [place] counties for at least the last three months.   You cannot be eligible for state or federal sponsored [employer], including [employer], [place], or [employer].   You generally cannot be eligible for healthcare insurance through your employer.    How to apply: Eligibility screenings are held every Tuesday and Wednesday afternoon from 1:00 pm until 4:00 pm. You do not need an appointment for the interview!  [facility] [address] [place], [place]   [place]  Freedom Department  [place]  [phone]    Behavioral Health Resources in the Community: Intensive Outpatient Programs Organization         Address  Phone  Notes  [place]. [address], [place], [place] [phone]   [facility] [address] [place], [place]   ADS: Alcohol & Drug Svcs [address] [place], [place]   [place] [address],  [place], [place] or [phone]   Substance Abuse Resources Organization         Address  Phone  Notes  Alcohol and Drug Services   [phone]   [place]  [phone]   [facility]   Chinita Pester  [phone]   Residential & Outpatient Substance Abuse Program  [phone]   Psychological Services Organization         Address  Phone  Notes  [facility] [place]  [place]  [phone]   [place] [address], [place] or [phone]    Mobile Crisis Teams Organization         Address  Phone  Notes  [facility], Mobile Crisis Care Unit  [phone]   [facility]  [address] [place], [place]   [place] Levels [address], [place] [phone]    Self-Help/Support Groups Organization         Address  Phone             Notes  [place]. of [place] - variety of support groups  [place] Call for more information  Narcotics Anonymous (NA), [facility] [address], [employer] [place]  2 meetings at this location   Special educational needs teacher         Address  Phone  Notes  [facility]idential [facility] [place],    [place]  [place]  [address] [place], [place], [place]   [place], [place] [phone]  Admissions: 8am-3pm M-F  Incentives Substance [place] [address],    [place], [place] [phone]   [facility] [address] [place], [place], [place]   [facility] [address],  [place], [place]   [facility] Programs - Intensive Outpatient [address] Krist[address], [place], [place]   [facility] [place].,  [place], [place] [phone] or [phone]   [facility] ([facility]) [address] [place], [place] Accepts Medicaid  [facility] [place] [address],  [place] [phone] Substance Abuse/Addiction Treatment   [facility] Organization         Address  Phone  Notes  [facility]  [phone]   Domenic Schwab, PhD [address] Arlis Porta [place], [place]   [phone] or [phone]   [place], [place] [phone]   [facility] [address], [place], [place] [phone] Insurance/Medicaid/sponsorship through [facility] and Families [address]                                    [place], [place] [phone] [place] [address], [place] [phone]    Dr. Adele Schilder  [phone]   [facility] [place] Dept. 1) [address],  2) [place] 3)  [place] [address], [place] [phone]  [phone]   [place] [phone] or [phone] (After Hours)

## 2013-08-30 NOTE — ED Provider Notes (Signed)
CSN: :[id number]     Arrival date & time 08/30/13  1048 History   First MD Initiated Contact with Patient 08/30/13 1150     Chief Complaint  Patient presents with  . Rectal Bleeding   (Consider location/radiation/quality/duration/timing/severity/associated sxs/prior Treatment) Patient is a 43 y.o. female presenting with hematochezia. The history is provided by the patient.  Rectal Bleeding  She developed rectal bleeding, yesterday. It was associated with periods of hard and loose bowel movements. She denies rectal trauma. She again had rectal bleeding, this morning, when she had a bowel movement. She started her menses this morning, as well. She denies recent fever, chills, nausea or vomiting, chest pain, shortness of breath, cough, weakness, or dizziness. She previously had rectal bleeding, when she had a hemorrhoid. There are no other known modifying factors.  Past Medical History  Diagnosis Date  . Migraines   . Asthma   . Bronchitis   . Bipolar 1 disorder   . Depression   . Hypertension    Past Surgical History  Procedure Laterality Date  . Fluid removal      brain  . Brain surgery    . Fracture surgery    . Tubal ligation     Family History  Problem Relation Age of Onset  . Cancer Other   . Diabetes Other    History  Substance Use Topics  . Smoking status: Never Smoker   . Smokeless tobacco: Never Used  . Alcohol Use: Yes     Comment: occassionally   OB History   Grav Para Term Preterm Abortions TAB SAB Ect Mult Living   5 5 5             Review of Systems  Gastrointestinal: Positive for hematochezia.  All other systems reviewed and are negative.    Allergies  Peanut-containing drug products  Home Medications   Current Outpatient Rx  Name  Route  Sig  Dispense  Refill  . albuterol (PROVENTIL HFA;VENTOLIN HFA) 108 (90 BASE) MCG/ACT inhaler   Inhalation   Inhale 2 puffs into the lungs every 6 (six) hours as needed for shortness of breath.          Marland Kitchen  albuterol (PROVENTIL) (2.5 MG/3ML) 0.083% nebulizer solution   Nebulization   Take 2.5 mg by nebulization every 6 (six) hours as needed for shortness of breath.         . ARIPiprazole (ABILIFY) 15 MG tablet   Oral   Take 15 mg by mouth daily.         Marland Kitchen atenolol (TENORMIN) 50 MG tablet   Oral   Take 50 mg by mouth 2 (two) times daily.         Marland Kitchen esomeprazole (NEXIUM) 40 MG capsule   Oral   Take 40 mg by mouth daily before breakfast.         . QUEtiapine Fumarate (SEROQUEL XR) 150 MG 24 hr tablet   Oral   Take 150 mg by mouth at bedtime.         . SUMAtriptan (IMITREX) 50 MG tablet   Oral   Take 50 mg by mouth every 2 (two) hours as needed for migraine.         . traZODone (DESYREL) 100 MG tablet   Oral   Take 100 mg by mouth at bedtime.         . hydrocortisone (ANUSOL-HC) 25 MG suppository   Rectal   Place 1 suppository (25 mg total) rectally 2 (two)  times daily. For 7 days   14 suppository   0   . oxyCODONE-acetaminophen (PERCOCET) 5-325 MG per tablet   Oral   Take 1 tablet by mouth every 4 (four) hours as needed for severe pain.   20 tablet   0    BP 117/85  Pulse 84  Temp(Src) 98.7 F (37.1 C) (Oral)  Resp 18  SpO2 97%  LMP 08/30/2013 Physical Exam  Nursing note and vitals reviewed. Constitutional: She is oriented to person, place, and time. She appears well-developed and well-nourished.  HENT:  Head: Normocephalic and atraumatic.  Eyes: Conjunctivae and EOM are normal. Pupils are equal, round, and reactive to light.  Neck: Normal range of motion and phonation normal. Neck supple.  Cardiovascular: Normal rate, regular rhythm and intact distal pulses.   Pulmonary/Chest: Effort normal and breath sounds normal. She exhibits no tenderness.  Abdominal: Soft. She exhibits no distension and no mass. There is no tenderness. There is no guarding.  Genitourinary:  Anus is normal in appearance. She has exquisite tenderness to palpation of the anus and  on digital examination. There is no palpable anal stricture. There were no visible or palpable external or internal hemorrhoids. There is no palpable rectal mass. There is no stool or blood in the rectum. Mucus was sent for Hemoccult testing.  Musculoskeletal: Normal range of motion.  Neurological: She is alert and oriented to person, place, and time. She exhibits normal muscle tone.  Skin: Skin is warm and dry.  Psychiatric: She has a normal mood and affect. Her behavior is normal. Judgment and thought content normal.    ED Course  Procedures (including critical care time)  Medications  oxyCODONE-acetaminophen (PERCOCET/ROXICET) 5-325 MG per tablet 2 tablet (2 tablets Oral Given 08/30/13 1240)  hydrocortisone (ANUSOL-HC) suppository 25 mg (25 mg Rectal Given 08/30/13 1240)  oxyCODONE-acetaminophen (PERCOCET/ROXICET) 5-325 MG per tablet 2 tablet (2 tablets Oral Given 08/30/13 1606)    Patient Vitals for the past 24 hrs:  BP Temp Temp src Pulse Resp SpO2  08/30/13 1511 117/85 mmHg 98.7 F (37.1 C) Oral 84 18 97 %  08/30/13 1406 143/86 mmHg 98 F (36.7 C) Oral 74 18 100 %  08/30/13 1107 111/53 mmHg 97.9 F (36.6 C) Oral 75 16 99 %    3:50 PM Reevaluation with update and discussion. After initial assessment and treatment, an updated evaluation reveals she is more comfortable now. Liberti Appleton L     Labs Review Labs Reviewed  URINALYSIS, ROUTINE W REFLEX MICROSCOPIC - Abnormal; Notable for the following:    APPearance CLOUDY (*)    Hgb urine dipstick LARGE (*)    All other components within normal limits  URINE MICROSCOPIC-ADD ON - Abnormal; Notable for the following:    Squamous Epithelial / LPF FEW (*)    Bacteria, UA MANY (*)    All other components within normal limits  OCCULT BLOOD, POC DEVICE - Abnormal; Notable for the following:    Fecal Occult Bld POSITIVE (*)    All other components within normal limits  POCT PREGNANCY, URINE   Imaging Review No results found.  EKG  Interpretation   None       MDM   1. Diarrhea   2. Rectal bleeding    Nonspecific anal pain, diarrhea, and rectal bleeding. She is hemodynamically stable, with a short-term illness.  Nursing Notes Reviewed/ Care Coordinated Applicable Imaging Reviewed Interpretation of Laboratory Data incorporated into ED treatment  The patient appears reasonably screened and/or stabilized for discharge  and I doubt any other medical condition or other [facility] requiring further screening, evaluation, or treatment in the ED at this time prior to discharge.  Plan: Home Medications- Hydrocortisone suppository, Percocet; Home Treatments- rest, warm soaks; return here if the recommended treatment, does not improve the symptoms; Recommended follow up- PCP prn    Richarda Blade, MD 08/30/13 2009

## 2013-08-30 NOTE — ED Notes (Signed)
MD informed that patient still complaining of pain and that patient had loose stool but now has pressure and throbbing at rectum.

## 2013-08-30 NOTE — Progress Notes (Signed)
[facility] CL provided pt with a [employer] application. Patient stated that she was going to the [facility] for primary care. Asked patient if she had an [facility] and patient stated that she had never heard of the program before. Asked patient if she had a Purple Card with [facility] and patient stated that it had expired. Explained to patient about the [facility] and how the [facility] could help her enroll.

## 2013-08-30 NOTE — ED Notes (Signed)
Pt reports abdominal pain, rectal bleeding "the runs" since last night. 10/10. Reports last night blood filled toilet, today just blood on toilet paper.

## 2013-08-30 NOTE — Progress Notes (Signed)
   CARE MANAGEMENT ED NOTE 08/30/2013  Patient:  Patricia [name], Patricia [name]   Account Number:  [id number]  Date Initiated:  08/30/2013  Documentation initiated by:  Livia Snellen  Subjective/Objective Assessment:   Patient presents to Ed with abdominalpain and rectal bleeding.     Subjective/Objective Assessment Detail:     Action/Plan:   Action/Plan Detail:   Anticipated DC Date:       Status Recommendation to Physician:   Result of Recommendation:    Other ED Services  Consult Working [place]  CM consult  [place] Program  Medication Assistance    Choice offered to / List presented to:            Status of service:  Completed, signed off  ED Comments:   ED Comments Detail:  [facility] received phone call from [place] for medication assistance.  EDCM wanted to speak to patient but the patient walked away from the pharmacy as per [place].  EDCM called the phone number listed as the patient's phone number but another woman answered, "She don't stay here no more."  According to patient's chart, patient does not have a pcp or insurance.  [facility] spoke to patient today and gave the patient information on the orange card and the purple card.  [facility] called pharmacy back and was able to speak to patient.  EDCM explained to patient about the [facility] program.  Patient currently getting her prescriptions filled this evening. Patient was not entered into the [facility] program before.  Expalined to patient that she will not be eligible for the [facility] program for another year.  Instructed patient that [facility] could not help her with the cost of the percocet.  Patient verbalized understanding.  Patient is agreeable to pay the cost of the percocet and the additional three dollar charge.  [place] letter faxed over to [facility] on [address] with confirmation of receipt at 1859pm, also spoketo pahrmacy tech who said they had received [place] letter.  No further EDCM needs at this  time.

## 2013-09-21 ENCOUNTER — Emergency Department (HOSPITAL_COMMUNITY): Payer: Self-pay

## 2013-09-21 ENCOUNTER — Encounter (HOSPITAL_COMMUNITY): Payer: Self-pay | Admitting: Emergency Medicine

## 2013-09-21 ENCOUNTER — Emergency Department (HOSPITAL_COMMUNITY)
Admission: EM | Admit: 2013-09-21 | Discharge: 2013-09-21 | Disposition: A | Discharge status: Home / Self Care | Payer: Self-pay | Attending: Emergency Medicine | Admitting: Emergency Medicine

## 2013-09-21 DIAGNOSIS — R079 Chest pain, unspecified: Secondary | ICD-10-CM | POA: Insufficient documentation

## 2013-09-21 DIAGNOSIS — E669 Obesity, unspecified: Secondary | ICD-10-CM | POA: Insufficient documentation

## 2013-09-21 DIAGNOSIS — I10 Essential (primary) hypertension: Secondary | ICD-10-CM | POA: Insufficient documentation

## 2013-09-21 DIAGNOSIS — M79609 Pain in unspecified limb: Secondary | ICD-10-CM

## 2013-09-21 DIAGNOSIS — R109 Unspecified abdominal pain: Secondary | ICD-10-CM | POA: Insufficient documentation

## 2013-09-21 DIAGNOSIS — J45909 Unspecified asthma, uncomplicated: Secondary | ICD-10-CM | POA: Insufficient documentation

## 2013-09-21 DIAGNOSIS — F3130 Bipolar disorder, current episode depressed, mild or moderate severity, unspecified: Secondary | ICD-10-CM | POA: Insufficient documentation

## 2013-09-21 DIAGNOSIS — G43909 Migraine, unspecified, not intractable, without status migrainosus: Secondary | ICD-10-CM | POA: Insufficient documentation

## 2013-09-21 DIAGNOSIS — M549 Dorsalgia, unspecified: Secondary | ICD-10-CM | POA: Insufficient documentation

## 2013-09-21 DIAGNOSIS — R252 Cramp and spasm: Secondary | ICD-10-CM | POA: Insufficient documentation

## 2013-09-21 DIAGNOSIS — Z79899 Other long term (current) drug therapy: Secondary | ICD-10-CM | POA: Insufficient documentation

## 2013-09-21 LAB — URINALYSIS, ROUTINE W REFLEX MICROSCOPIC
BILIRUBIN URINE: NEGATIVE
Glucose, UA: NEGATIVE mg/dL
HGB URINE DIPSTICK: NEGATIVE
KETONES UR: NEGATIVE mg/dL
Leukocytes, UA: NEGATIVE
Nitrite: NEGATIVE
Protein, ur: NEGATIVE mg/dL
SPECIFIC GRAVITY, URINE: 1.013 (ref 1.005–1.030)
Urobilinogen, UA: 0.2 mg/dL (ref 0.0–1.0)
pH: 8 (ref 5.0–8.0)

## 2013-09-21 LAB — BASIC METABOLIC PANEL
BUN: 9 mg/dL (ref 6–23)
CO2: 23 mEq/L (ref 19–32)
CREATININE: 0.87 mg/dL (ref 0.50–1.10)
Calcium: 9.5 mg/dL (ref 8.4–10.5)
Chloride: 104 mEq/L (ref 96–112)
GFR, EST NON AFRICAN AMERICAN: 80 mL/min — AB (ref >90)
GLUCOSE: 101 mg/dL — AB (ref 70–99)
Potassium: 4.2 mEq/L (ref 3.7–5.3)
Sodium: 138 mEq/L (ref 137–147)

## 2013-09-21 LAB — CBC WITH DIFFERENTIAL/PLATELET
Basophils Absolute: 0 10*3/uL (ref 0.0–0.1)
Basophils Relative: 1 % (ref 0–1)
EOS ABS: 0.1 10*3/uL (ref 0.0–0.7)
Eosinophils Relative: 2 % (ref 0–5)
HCT: 32.9 % — ABNORMAL LOW (ref 36.0–46.0)
HEMOGLOBIN: 10.5 g/dL — AB (ref 12.0–15.0)
Lymphocytes Relative: 31 % (ref 12–46)
Lymphs Abs: 2.4 10*3/uL (ref 0.7–4.0)
MCH: 24.1 pg — ABNORMAL LOW (ref 26.0–34.0)
MCHC: 31.9 g/dL (ref 30.0–36.0)
MCV: 75.6 fL — AB (ref 78.0–100.0)
MONO ABS: 0.5 10*3/uL (ref 0.1–1.0)
MONOS PCT: 7 % (ref 3–12)
Neutro Abs: 4.6 10*3/uL (ref 1.7–7.7)
Neutrophils Relative %: 60 % (ref 43–77)
Platelets: 370 10*3/uL (ref 150–400)
RBC: 4.35 MIL/uL (ref 3.87–5.11)
RDW: 15.9 % — ABNORMAL HIGH (ref 11.5–15.5)
WBC: 7.7 10*3/uL (ref 4.0–10.5)

## 2013-09-21 MED ORDER — IBUPROFEN 600 MG PO TABS: 600.0000 mg | ORAL_TABLET | Freq: Four times a day (QID) | ORAL | Status: DC | PRN

## 2013-09-21 MED ORDER — ONDANSETRON HCL 4 MG/2ML IJ SOLN
4.0000 mg | Freq: Once | INTRAMUSCULAR | Status: AC
  Administered 2013-09-21: 4 mg via INTRAVENOUS
  Filled 2013-09-21: qty 2

## 2013-09-21 MED ORDER — SODIUM CHLORIDE 0.9 % IV BOLUS (SEPSIS)
500.0000 mL | Freq: Once | INTRAVENOUS | Status: AC
  Administered 2013-09-21: 500 mL via INTRAVENOUS

## 2013-09-21 MED ORDER — HYDROMORPHONE HCL PF 1 MG/ML IJ SOLN
1.0000 mg | Freq: Once | INTRAMUSCULAR | Status: AC
  Administered 2013-09-21: 1 mg via INTRAVENOUS
  Filled 2013-09-21: qty 1

## 2013-09-21 MED ORDER — OXYCODONE-ACETAMINOPHEN 5-325 MG PO TABS: 1.0000 | ORAL_TABLET | Freq: Four times a day (QID) | ORAL | Status: DC | PRN

## 2013-09-21 MED ORDER — MORPHINE SULFATE 4 MG/ML IJ SOLN
4.0000 mg | Freq: Once | INTRAMUSCULAR | Status: AC
  Administered 2013-09-21: 4 mg via INTRAVENOUS
  Filled 2013-09-21: qty 1

## 2013-09-21 MED ORDER — DIAZEPAM 5 MG PO TABS: 5.0000 mg | ORAL_TABLET | Freq: Four times a day (QID) | ORAL | Status: DC | PRN

## 2013-09-21 NOTE — Discharge Instructions (Signed)
°  Muscle Cramps and Spasms °Muscle cramps and spasms occur when a muscle or muscles tighten and you have no control over this tightening (involuntary muscle contraction). They are a common problem and can develop in any muscle. The most common place is in the calf muscles of the leg. Both muscle cramps and muscle spasms are involuntary muscle contractions, but they also have differences:  °· Muscle cramps are sporadic and painful. They may last a few seconds to a quarter of an hour. Muscle cramps are often more forceful and last longer than muscle spasms. °· Muscle spasms may or may not be painful. They may also last just a few seconds or much longer. °CAUSES  °It is uncommon for cramps or spasms to be due to a serious underlying problem. In many cases, the cause of cramps or spasms is unknown. Some common causes are:  °· Overexertion.   °· Overuse from repetitive motions (doing the same thing over and over).   °· Remaining in a certain position for a long period of time.   °· Improper preparation, form, or technique while performing a sport or activity.   °· Dehydration.   °· Injury.   °· Side effects of some medicines.   °· Abnormally low levels of the salts and ions in your blood (electrolytes), especially potassium and calcium. This could happen if you are taking water pills (diuretics) or you are pregnant.   °Some underlying medical problems can make it more likely to develop cramps or spasms. These include, but are not limited to:  °· Diabetes.   °· Parkinson disease.   °· Hormone disorders, such as thyroid problems.   °· Alcohol abuse.   °· Diseases specific to muscles, joints, and bones.   °· Blood vessel disease where not enough blood is getting to the muscles.   °HOME CARE INSTRUCTIONS  °· Stay well hydrated. Drink enough water and fluids to keep your urine clear or pale yellow. °· It may be helpful to massage, stretch, and relax the affected muscle. °· For tight or tense muscles, use a warm towel, heating  pad, or hot shower water directed to the affected area. °· If you are sore or have pain after a cramp or spasm, applying ice to the affected area may relieve discomfort. °· Put ice in a plastic bag. °· Place a towel between your skin and the bag. °· Leave the ice on for 15-20 minutes, 03-04 times a day. °· Medicines used to treat a known cause of cramps or spasms may help reduce their frequency or severity. Only take over-the-counter or prescription medicines as directed by your caregiver. °SEEK MEDICAL CARE IF:  °Your cramps or spasms get more severe, more frequent, or do not improve over time.  °MAKE SURE YOU:  °· Understand these instructions. °· Will watch your condition. °· Will get help right away if you are not doing well or get worse. °Document Released: 01/04/2002 Document Revised: 11/09/2012 Document Reviewed: 07/01/2012 °ExitCare® Patient Information ©2014 [employer]. ° ° °

## 2013-09-21 NOTE — Progress Notes (Signed)
   CARE MANAGEMENT ED NOTE 09/21/2013  Patient:  Patricia [name], Patricia [name]   Account Number:  [id number]  Date Initiated:  09/21/2013  Documentation initiated by:  Jackelyn Poling  Subjective/Objective Assessment:   43 yr old self pay pt no pcp listed 4 [facility] ED visits in last 6 months Seen last by an ED CM on 08/30/13 & provided MATCH assistance     Subjective/Objective Assessment Detail:   presents via PTAR from home with a chief complaint of generalized body cramping since yesterday. Patient reports a history of hypokalemia and has been hospitalized for the same. Patient is not taking any potassium supplementation at this time. Patient reports pain is worse upon movement.  Female at bedside stated it was "unlawful" for pt to be in pain & not be able get assist with "narcotics"  Cm explained to pt that [facility] does not provide medication assistance for narcotics Pt also confirmed she had attempted to get assistance from [facility] for narcotics & was told that [facility] does not assist with narcotics Pt confirms [facility] assists her with all her other medications  Pt offered a list of local churches but she refused & states she has some places to go to for financial assistance     Action/Plan:   CM spoke with pt and review resources for [place] provided pt with a community assistance program Rx drug discount card  see notes below   Action/Plan Detail:   CM discussed with pt that the [facility] letter is for a one time assistance and does not assist her with her medications for "the whole year"  as pt informed CM she wanted.  CM encouraged pt to use resources provided by CM   Anticipated DC Date:  09/21/2013     Status Recommendation to Physician:   Result of Recommendation:    Other ED Services  Consult Working [place]  Other  CM consult  Outpatient Services - Pt will follow up  PCP issues  Patient refused services    Choice offered to / List presented to:            Status of  service:  Completed, signed off  ED Comments:  CM re reviewed with the pt about [facility] MATCH program (annual assistance, $3 co pay for each Rx through [facility] program, does not include refills, 7 day expiration of [place] letter and choice of pharmacies)  ED Comments Detail:  CM discussed and provided written information for self pay pcps, importance of pcp for f/u care, www.needymeds.org, discounted pharmacies and other [employer] such as financial assistance, DSS and  health department Reviewed resources for [employer] self pay pcps like [employer], family medicine at [address], [facility] family practice, general medical clinics, [facility] urgent care plus others, [facility] out patient pharmacies and housing Pt voiced understanding and appreciation of resources provided  Provided [facility] contact information
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
## 2013-09-21 NOTE — ED Notes (Signed)
Bed: [facility] Expected date:  Expected time:  Means of arrival:  Comments: EMS  

## 2013-09-21 NOTE — ED Notes (Signed)
Patient presents via PTAR from home with a chief complaint of generalized body cramping since yesterday. Patient reports a history of hypokalemia and has been hospitalized for the same. Patient is not taking any potassium supplementation at this time. Patient reports pain is worse upon movement.

## 2013-09-21 NOTE — Progress Notes (Signed)
VASCULAR LAB PRELIMINARY  PRELIMINARY  PRELIMINARY  PRELIMINARY  Right lower extremity venous duplex completed.    Preliminary report:  Right:  No evidence of DVT, superficial thrombosis, or Baker's cyst.  Jaquari Reckner, RVT 09/21/2013, 2:42 PM

## 2013-11-03 ENCOUNTER — Emergency Department (HOSPITAL_COMMUNITY)
Admission: EM | Admit: 2013-11-03 | Discharge: 2013-11-03 | Disposition: A | Discharge status: Home / Self Care | Payer: Self-pay | Attending: Emergency Medicine | Admitting: Emergency Medicine

## 2013-11-03 ENCOUNTER — Encounter (HOSPITAL_COMMUNITY): Payer: Self-pay | Admitting: Emergency Medicine

## 2013-11-03 ENCOUNTER — Emergency Department (HOSPITAL_COMMUNITY): Payer: Self-pay

## 2013-11-03 DIAGNOSIS — N83209 Unspecified ovarian cyst, unspecified side: Secondary | ICD-10-CM | POA: Insufficient documentation

## 2013-11-03 DIAGNOSIS — N840 Polyp of corpus uteri: Secondary | ICD-10-CM | POA: Insufficient documentation

## 2013-11-03 DIAGNOSIS — Z3202 Encounter for pregnancy test, result negative: Secondary | ICD-10-CM | POA: Insufficient documentation

## 2013-11-03 DIAGNOSIS — G43909 Migraine, unspecified, not intractable, without status migrainosus: Secondary | ICD-10-CM | POA: Insufficient documentation

## 2013-11-03 DIAGNOSIS — Z79899 Other long term (current) drug therapy: Secondary | ICD-10-CM | POA: Insufficient documentation

## 2013-11-03 DIAGNOSIS — IMO0002 Reserved for concepts with insufficient information to code with codable children: Secondary | ICD-10-CM | POA: Insufficient documentation

## 2013-11-03 DIAGNOSIS — J45909 Unspecified asthma, uncomplicated: Secondary | ICD-10-CM | POA: Insufficient documentation

## 2013-11-03 DIAGNOSIS — I10 Essential (primary) hypertension: Secondary | ICD-10-CM | POA: Insufficient documentation

## 2013-11-03 DIAGNOSIS — R109 Unspecified abdominal pain: Secondary | ICD-10-CM

## 2013-11-03 DIAGNOSIS — F319 Bipolar disorder, unspecified: Secondary | ICD-10-CM | POA: Insufficient documentation

## 2013-11-03 LAB — COMPREHENSIVE METABOLIC PANEL
ALBUMIN: 4 g/dL (ref 3.5–5.2)
ALT: 13 U/L (ref 0–35)
AST: 15 U/L (ref 0–37)
Alkaline Phosphatase: 54 U/L (ref 39–117)
BUN: 5 mg/dL — ABNORMAL LOW (ref 6–23)
CO2: 21 mEq/L (ref 19–32)
Calcium: 10.1 mg/dL (ref 8.4–10.5)
Chloride: 102 mEq/L (ref 96–112)
Creatinine, Ser: 0.98 mg/dL (ref 0.50–1.10)
GFR calc Af Amer: 81 mL/min — ABNORMAL LOW (ref >90)
GFR calc non Af Amer: 70 mL/min — ABNORMAL LOW (ref >90)
Glucose, Bld: 100 mg/dL — ABNORMAL HIGH (ref 70–99)
Potassium: 4.1 mEq/L (ref 3.7–5.3)
Sodium: 138 mEq/L (ref 137–147)
TOTAL PROTEIN: 8.1 g/dL (ref 6.0–8.3)
Total Bilirubin: 0.2 mg/dL — ABNORMAL LOW (ref 0.3–1.2)

## 2013-11-03 LAB — PREGNANCY, URINE: PREG TEST UR: NEGATIVE

## 2013-11-03 LAB — CBC WITH DIFFERENTIAL/PLATELET
BASOS PCT: 0 % (ref 0–1)
Basophils Absolute: 0 10*3/uL (ref 0.0–0.1)
EOS ABS: 0.1 10*3/uL (ref 0.0–0.7)
Eosinophils Relative: 1 % (ref 0–5)
HCT: 36.9 % (ref 36.0–46.0)
HEMOGLOBIN: 11.7 g/dL — AB (ref 12.0–15.0)
Lymphocytes Relative: 31 % (ref 12–46)
Lymphs Abs: 2.6 10*3/uL (ref 0.7–4.0)
MCH: 23.5 pg — AB (ref 26.0–34.0)
MCHC: 31.7 g/dL (ref 30.0–36.0)
MCV: 74.1 fL — AB (ref 78.0–100.0)
MONOS PCT: 6 % (ref 3–12)
Monocytes Absolute: 0.5 10*3/uL (ref 0.1–1.0)
NEUTROS PCT: 62 % (ref 43–77)
Neutro Abs: 5.4 10*3/uL (ref 1.7–7.7)
PLATELETS: 423 10*3/uL — AB (ref 150–400)
RBC: 4.98 MIL/uL (ref 3.87–5.11)
RDW: 15.5 % (ref 11.5–15.5)
WBC: 8.6 10*3/uL (ref 4.0–10.5)

## 2013-11-03 LAB — URINALYSIS, ROUTINE W REFLEX MICROSCOPIC
Bilirubin Urine: NEGATIVE
GLUCOSE, UA: NEGATIVE mg/dL
Hgb urine dipstick: NEGATIVE
KETONES UR: NEGATIVE mg/dL
LEUKOCYTES UA: NEGATIVE
Nitrite: NEGATIVE
PH: 8 (ref 5.0–8.0)
Protein, ur: NEGATIVE mg/dL
SPECIFIC GRAVITY, URINE: 1.006 (ref 1.005–1.030)
Urobilinogen, UA: 0.2 mg/dL (ref 0.0–1.0)

## 2013-11-03 LAB — HIV ANTIBODY (ROUTINE TESTING W REFLEX): HIV 1&2 Ab, 4th Generation: NONREACTIVE

## 2013-11-03 LAB — WET PREP, GENITAL
Clue Cells Wet Prep HPF POC: NONE SEEN
Trich, Wet Prep: NONE SEEN
WBC WET PREP: NONE SEEN
Yeast Wet Prep HPF POC: NONE SEEN

## 2013-11-03 MED ORDER — HYDROCODONE-ACETAMINOPHEN 5-325 MG PO TABS
2.0000 | ORAL_TABLET | Freq: Once | ORAL | Status: AC
  Administered 2013-11-03: 2 via ORAL
  Filled 2013-11-03: qty 2

## 2013-11-03 NOTE — ED Notes (Signed)
Patient transported to Ultrasound 

## 2013-11-03 NOTE — Discharge Instructions (Signed)
Abdominal Pain, Women °Abdominal (stomach, pelvic, or belly) pain can be caused by many things. It is important to tell your doctor: °· The location of the pain. °· Does it come and go or is it present all the time? °· Are there things that start the pain (eating certain foods, exercise)? °· Are there other symptoms associated with the pain (fever, nausea, vomiting, diarrhea)? °All of this is helpful to know when trying to find the cause of the pain. °CAUSES  °· Stomach: virus or bacteria infection, or ulcer. °· Intestine: appendicitis (inflamed appendix), regional ileitis (Crohn's disease), ulcerative colitis (inflamed colon), irritable bowel syndrome, diverticulitis (inflamed diverticulum of the colon), or cancer of the stomach or intestine. °· Gallbladder disease or stones in the gallbladder. °· Kidney disease, kidney stones, or infection. °· Pancreas infection or cancer. °· Fibromyalgia (pain disorder). °· Diseases of the female organs: °· Uterus: fibroid (non-cancerous) tumors or infection. °· Fallopian tubes: infection or tubal pregnancy. °· Ovary: cysts or tumors. °· Pelvic adhesions (scar tissue). °· Endometriosis (uterus lining tissue growing in the pelvis and on the pelvic organs). °· Pelvic congestion syndrome (female organs filling up with blood just before the menstrual period). °· Pain with the menstrual period. °· Pain with ovulation (producing an egg). °· Pain with an IUD (intrauterine device, birth control) in the uterus. °· Cancer of the female organs. °· Functional pain (pain not caused by a disease, may improve without treatment). °· Psychological pain. °· Depression. °DIAGNOSIS  °Your doctor will decide the seriousness of your pain by doing an examination. °· Blood tests. °· X-rays. °· Ultrasound. °· CT scan (computed tomography, special type of X-ray). °· MRI (magnetic resonance imaging). °· Cultures, for infection. °· Barium enema (dye inserted in the large intestine, to better view it with  X-rays). °· Colonoscopy (looking in intestine with a lighted tube). °· Laparoscopy (minor surgery, looking in abdomen with a lighted tube). °· Major abdominal exploratory surgery (looking in abdomen with a large incision). °TREATMENT  °The treatment will depend on the cause of the pain.  °· Many cases can be observed and treated at home. °· Over-the-counter medicines recommended by your caregiver. °· Prescription medicine. °· Antibiotics, for infection. °· Birth control pills, for painful periods or for ovulation pain. °· Hormone treatment, for endometriosis. °· Nerve blocking injections. °· Physical therapy. °· Antidepressants. °· Counseling with a psychologist or psychiatrist. °· Minor or major surgery. °HOME CARE INSTRUCTIONS  °· Do not take laxatives, unless directed by your caregiver. °· Take over-the-counter pain medicine only if ordered by your caregiver. Do not take aspirin because it can cause an upset stomach or bleeding. °· Try a clear liquid diet (broth or water) as ordered by your caregiver. Slowly move to a bland diet, as tolerated, if the pain is related to the stomach or intestine. °· Have a thermometer and take your temperature several times a day, and record it. °· Bed rest and sleep, if it helps the pain. °· Avoid sexual intercourse, if it causes pain. °· Avoid stressful situations. °· Keep your follow-up appointments and tests, as your caregiver orders. °· If the pain does not go away with medicine or surgery, you may try: °· Acupuncture. °· Relaxation exercises (yoga, meditation). °· Group therapy. °· Counseling. °SEEK MEDICAL CARE IF:  °· You notice certain foods cause stomach pain. °· Your home care treatment is not helping your pain. °· You need stronger pain medicine. °· You want your IUD removed. °· You feel faint or   lightheaded. °· You develop nausea and vomiting. °· You develop a rash. °· You are having side effects or an allergy to your medicine. °SEEK IMMEDIATE MEDICAL CARE IF:  °· Your  pain does not go away or gets worse. °· You have a fever. °· Your pain is felt only in portions of the abdomen. The right side could possibly be appendicitis. The left lower portion of the abdomen could be colitis or diverticulitis. °· You are passing blood in your stools (bright red or black tarry stools, with or without vomiting). °· You have blood in your urine. °· You develop chills, with or without a fever. °· You pass out. °MAKE SURE YOU:  °· Understand these instructions. °· Will watch your condition. °· Will get help right away if you are not doing well or get worse. °Document Released: 05/12/2007 Document Revised: 10/07/2011 Document Reviewed: 06/01/2009 °Exit[employer]® Patient Information ©2014 [employer]. ° °

## 2013-11-03 NOTE — ED Provider Notes (Signed)
CSN: [id number]     Arrival date & time 11/03/13  1212 History   First MD Initiated Contact with Patient 11/03/13 1251     Chief Complaint  Patient presents with  . Abdominal Pain     (Consider location/radiation/quality/duration/timing/severity/associated sxs/prior Treatment) HPI 43 year old female presents with right lower quadrant abdominal pain that started last night. She states it started shortly after intercourse with her fianc. At that time the pain was intermittent but has now become progressively constant. The pain is severe and sharp. It does feel similar to when she had problems with her right ovarian cyst. Denies a fevers, chills, nausea, or vomiting. No change in her bowel habits. No urinary symptoms. No vaginal discharge. She has not taken anything for the pain.  Past Medical History  Diagnosis Date  . Migraines   . Asthma   . Bronchitis   . Bipolar 1 disorder   . Depression   . Hypertension    Past Surgical History  Procedure Laterality Date  . Fluid removal      brain  . Brain surgery    . Fracture surgery    . Tubal ligation     Family History  Problem Relation Age of Onset  . Cancer Other   . Diabetes Other    History  Substance Use Topics  . Smoking status: Never Smoker   . Smokeless tobacco: Never Used  . Alcohol Use: Yes     Comment: occassionally   OB History   Grav Para Term Preterm Abortions TAB SAB Ect Mult Living   5 5 5             Review of Systems  Constitutional: Negative for fever.  Gastrointestinal: Positive for abdominal pain. Negative for nausea, vomiting, diarrhea and constipation.  Genitourinary: Negative for dysuria, vaginal bleeding, vaginal discharge and menstrual problem.  Musculoskeletal: Negative for back pain.  All other systems reviewed and are negative.     Allergies  Peanut-containing drug products  Home Medications   Current Outpatient Rx  Name  Route  Sig  Dispense  Refill  . albuterol (PROVENTIL  HFA;VENTOLIN HFA) 108 (90 BASE) MCG/ACT inhaler   Inhalation   Inhale 2 puffs into the lungs every 6 (six) hours as needed for shortness of breath.          Marland Kitchen albuterol (PROVENTIL) (2.5 MG/3ML) 0.083% nebulizer solution   Nebulization   Take 2.5 mg by nebulization every 6 (six) hours as needed for shortness of breath.         . ARIPiprazole (ABILIFY) 15 MG tablet   Oral   Take 15 mg by mouth daily.         Marland Kitchen atenolol (TENORMIN) 50 MG tablet   Oral   Take 50 mg by mouth 2 (two) times daily.         . diazepam (VALIUM) 5 MG tablet   Oral   Take 1 tablet (5 mg total) by mouth every 6 (six) hours as needed for muscle spasms.   10 tablet   0   . esomeprazole (NEXIUM) 40 MG capsule   Oral   Take 40 mg by mouth daily before breakfast.         . hydrocortisone (ANUSOL-HC) 25 MG suppository   Rectal   Place 1 suppository (25 mg total) rectally 2 (two) times daily. For 7 days   14 suppository   0   . ibuprofen (ADVIL,MOTRIN) 600 MG tablet   Oral   Take 1 tablet (  600 mg total) by mouth every 6 (six) hours as needed.   20 tablet   0   . oxyCODONE-acetaminophen (PERCOCET) 5-325 MG per tablet   Oral   Take 1 tablet by mouth every 4 (four) hours as needed for severe pain.   20 tablet   0   . oxyCODONE-acetaminophen (PERCOCET/ROXICET) 5-325 MG per tablet   Oral   Take 1-2 tablets by mouth every 6 (six) hours as needed for severe pain.   10 tablet   0   . QUEtiapine Fumarate (SEROQUEL XR) 150 MG 24 hr tablet   Oral   Take 150 mg by mouth at bedtime.         . SUMAtriptan (IMITREX) 50 MG tablet   Oral   Take 50 mg by mouth every 2 (two) hours as needed for migraine.         . traZODone (DESYREL) 100 MG tablet   Oral   Take 100 mg by mouth at bedtime.          BP 152/110  Pulse 63  Temp(Src) 98.4 F (36.9 C)  Resp 20  SpO2 100%  LMP 10/26/2013 Physical Exam  Nursing note and vitals reviewed. Constitutional: She is oriented to person, place, and  time. She appears well-developed and well-nourished. No distress.  HENT:  Head: Normocephalic and atraumatic.  Right Ear: External ear normal.  Left Ear: External ear normal.  Nose: Nose normal.  Eyes: Right eye exhibits no discharge. Left eye exhibits no discharge.  Cardiovascular: Normal rate, regular rhythm and normal heart sounds.   Pulmonary/Chest: Effort normal and breath sounds normal.  Abdominal: Soft. There is tenderness (tender over RLQ/right pelvic abdomen). There is no rebound and no guarding.  Genitourinary: Uterus is not enlarged and not tender. Cervix exhibits no motion tenderness. Right adnexum displays tenderness. No tenderness around the vagina. No vaginal discharge found.  Neurological: She is alert and oriented to person, place, and time.  Skin: Skin is warm and dry.    ED Course  Procedures (including critical care time) Labs Review Labs Reviewed  CBC WITH DIFFERENTIAL - Abnormal; Notable for the following:    Hemoglobin 11.7 (*)    MCV 74.1 (*)    MCH 23.5 (*)    Platelets 423 (*)    All other components within normal limits  COMPREHENSIVE METABOLIC PANEL - Abnormal; Notable for the following:    Glucose, Bld 100 (*)    BUN 5 (*)    Total Bilirubin 0.2 (*)    GFR calc non Af Amer 70 (*)    GFR calc Af Amer 81 (*)    All other components within normal limits  WET PREP, GENITAL  GC/CHLAMYDIA PROBE AMP  URINALYSIS, ROUTINE W REFLEX MICROSCOPIC  PREGNANCY, URINE  HIV ANTIBODY (ROUTINE TESTING)   Imaging Review US Transvaginal Non-ob  11/03/2013   CLINICAL DATA:  Pain.  EXAM: TRANSABDOMINAL AND TRANSVAGINAL ULTRASOUND OF PELVIS  DOPPLER ULTRASOUND OF OVARIES  TECHNIQUE: Both transabdominal and transvaginal ultrasound examinations of the pelvis were performed. Transabdominal technique was performed for global imaging of the pelvis including uterus, ovaries, adnexal regions, and pelvic cul-de-sac.  It was necessary to proceed with endovaginal exam following the  transabdominal exam to visualize the uterus and ovaries. Color and duplex Doppler ultrasound was utilized to evaluate blood flow to the ovaries.  COMPARISON:  US PELVIS COMPLETE MODIFY dated 04/19/2010  FINDINGS: Uterus  Measurements: 8.6 x 5.1 by 4.7 cm. 1.4 x 1.2 x 1.0 cm fibroid  anterior uterine wall.  Endometrium  Thickness: Endometrial thickening to approximately 17 mm. A 1.0 by 0.8 x 1.3 cm endometrial polypoid lesion cannot be excluded.  Right ovary  Measurements: 3.8 x 2.9 x 2.8 cm. Two simple appearing cysts appear present. The largest measures 2.2 cm in maximum diameter.  Left ovary  Measurements: 2.3 x 1.4 x 1.6 cm. Normal appearance/no adnexal mass.  Pulsed Doppler evaluation of both ovaries demonstrates normal low-resistance arterial and venous waveforms.  Other findings  No free fluid.  IMPRESSION: 1. No evidence of torsion. 2. Endometrial thickening to 17 mm with approximately 1.3 cm endometrial polypoid lesion. Gynecologic evaluation suggested. 3. Tiny 1.4 cm anterior uterine body fibroid. 4. 2 small simple right ovarian cysts.   Electronically Signed   By: Marcello Moores  Register   On: 11/03/2013 15:50   [place] Art/ven Flow Abd Pelv Doppler  11/03/2013   CLINICAL DATA:  Pain.  EXAM: TRANSABDOMINAL AND TRANSVAGINAL ULTRASOUND OF PELVIS  DOPPLER ULTRASOUND OF OVARIES  TECHNIQUE: Both transabdominal and transvaginal ultrasound examinations of the pelvis were performed. Transabdominal technique was performed for global imaging of the pelvis including uterus, ovaries, adnexal regions, and pelvic cul-de-sac.  It was necessary to proceed with endovaginal exam following the transabdominal exam to visualize the uterus and ovaries. Color and duplex Doppler ultrasound was utilized to evaluate blood flow to the ovaries.  COMPARISON:  US PELVIS COMPLETE MODIFY dated 04/19/2010  FINDINGS: Uterus  Measurements: 8.6 x 5.1 by 4.7 cm. 1.4 x 1.2 x 1.0 cm fibroid anterior uterine wall.  Endometrium  Thickness: Endometrial  thickening to approximately 17 mm. A 1.0 by 0.8 x 1.3 cm endometrial polypoid lesion cannot be excluded.  Right ovary  Measurements: 3.8 x 2.9 x 2.8 cm. Two simple appearing cysts appear present. The largest measures 2.2 cm in maximum diameter.  Left ovary  Measurements: 2.3 x 1.4 x 1.6 cm. Normal appearance/no adnexal mass.  Pulsed Doppler evaluation of both ovaries demonstrates normal low-resistance arterial and venous waveforms.  Other findings  No free fluid.  IMPRESSION: 1. No evidence of torsion. 2. Endometrial thickening to 17 mm with approximately 1.3 cm endometrial polypoid lesion. Gynecologic evaluation suggested. 3. Tiny 1.4 cm anterior uterine body fibroid. 4. 2 small simple right ovarian cysts.   Electronically Signed   By: Marcello Moores  Register   On: 11/03/2013 15:50     EKG Interpretation None      MDM   Final diagnoses:  Abdominal pain  Ovarian cyst  Endometrial polyp    Patient appears well here, has focal right pelvic tenderness. No hematuria, fevers, or urinary symptoms. This started shortly after intercourse. Given the acute onset and tenderness localized on the pelvic exam, a pelvic ultrasound was obtained. Is no evidence of torsion or hemorrhagic cyst. Given the acuity of her symptoms at the fracture intercourse I have low suspicion for appendicitis, diverticulitis/colitis or other acute intra-abdominal process. She will be treated with ibuprofen and Tylenol and follow up with gynecology. I did inform her of the endometrial polyp seen on ultrasound and recommended close gynecologic followup.    Ephraim Hamburger, MD 11/03/13 [phone]

## 2013-11-03 NOTE — Progress Notes (Signed)
[facility] CL provided pt with a [employer] application. CL has seen patient several time's and has provided her with same resources every time. Today patient stated that she knew all about the information. Patient stated that she was receiving medical care through [facility] and "the only reason she did not go there today was because nurse's are not there on Wedsneday's." Patient does not have [place], just receives medical care with [facility] and prescriptions through [facility] at [facility]. Provided pt with [place] Card application.

## 2013-11-03 NOTE — ED Notes (Signed)
Pt c/o right lower abd pain started last night; worse this morning; sharp

## 2013-11-05 LAB — GC/CHLAMYDIA PROBE AMP
CT PROBE, AMP APTIMA: NEGATIVE
GC Probe RNA: NEGATIVE

## 2013-11-26 DIAGNOSIS — E119 Type 2 diabetes mellitus without complications: Secondary | ICD-10-CM

## 2013-11-26 HISTORY — DX: Type 2 diabetes mellitus without complications: E11.9

## 2013-11-29 ENCOUNTER — Telehealth: Payer: Self-pay | Admitting: General Practice

## 2013-11-29 ENCOUNTER — Observation Stay (HOSPITAL_COMMUNITY)
Admission: EM | Admit: 2013-11-29 | Discharge: 2013-12-01 | Disposition: A | Discharge status: Home / Self Care | Payer: Self-pay | Attending: Family Medicine | Admitting: Family Medicine

## 2013-11-29 ENCOUNTER — Encounter (HOSPITAL_COMMUNITY): Payer: Self-pay | Admitting: Emergency Medicine

## 2013-11-29 ENCOUNTER — Encounter: Payer: Self-pay | Admitting: General Practice

## 2013-11-29 ENCOUNTER — Encounter: Payer: Self-pay | Admitting: Family Medicine

## 2013-11-29 ENCOUNTER — Emergency Department (HOSPITAL_COMMUNITY): Payer: Self-pay

## 2013-11-29 DIAGNOSIS — G4733 Obstructive sleep apnea (adult) (pediatric): Secondary | ICD-10-CM

## 2013-11-29 DIAGNOSIS — F411 Generalized anxiety disorder: Secondary | ICD-10-CM

## 2013-11-29 DIAGNOSIS — J42 Unspecified chronic bronchitis: Secondary | ICD-10-CM

## 2013-11-29 DIAGNOSIS — J45909 Unspecified asthma, uncomplicated: Secondary | ICD-10-CM

## 2013-11-29 DIAGNOSIS — I200 Unstable angina: Secondary | ICD-10-CM

## 2013-11-29 DIAGNOSIS — F319 Bipolar disorder, unspecified: Secondary | ICD-10-CM

## 2013-11-29 DIAGNOSIS — F329 Major depressive disorder, single episode, unspecified: Secondary | ICD-10-CM

## 2013-11-29 DIAGNOSIS — R079 Chest pain, unspecified: Secondary | ICD-10-CM

## 2013-11-29 DIAGNOSIS — J45901 Unspecified asthma with (acute) exacerbation: Secondary | ICD-10-CM | POA: Insufficient documentation

## 2013-11-29 DIAGNOSIS — G43909 Migraine, unspecified, not intractable, without status migrainosus: Secondary | ICD-10-CM | POA: Diagnosis present

## 2013-11-29 DIAGNOSIS — F3289 Other specified depressive episodes: Secondary | ICD-10-CM

## 2013-11-29 DIAGNOSIS — Z79899 Other long term (current) drug therapy: Secondary | ICD-10-CM | POA: Insufficient documentation

## 2013-11-29 DIAGNOSIS — R072 Precordial pain: Principal | ICD-10-CM | POA: Insufficient documentation

## 2013-11-29 DIAGNOSIS — I10 Essential (primary) hypertension: Secondary | ICD-10-CM | POA: Insufficient documentation

## 2013-11-29 DIAGNOSIS — F3130 Bipolar disorder, current episode depressed, mild or moderate severity, unspecified: Secondary | ICD-10-CM | POA: Insufficient documentation

## 2013-11-29 HISTORY — DX: Mononeuropathy, unspecified: G58.9

## 2013-11-29 HISTORY — DX: Personal history of other medical treatment: Z92.89

## 2013-11-29 HISTORY — DX: Anxiety disorder, unspecified: F41.9

## 2013-11-29 HISTORY — DX: Unstable angina: I20.0

## 2013-11-29 HISTORY — DX: Pneumonia, unspecified organism: J18.9

## 2013-11-29 HISTORY — DX: Schizophrenia, unspecified: F20.9

## 2013-11-29 HISTORY — DX: Unspecified chronic bronchitis: J42

## 2013-11-29 HISTORY — DX: Sleep apnea, unspecified: G47.30

## 2013-11-29 HISTORY — DX: Pure hypercholesterolemia, unspecified: E78.00

## 2013-11-29 HISTORY — DX: Gastro-esophageal reflux disease without esophagitis: K21.9

## 2013-11-29 LAB — CBC
HCT: 32.5 % — ABNORMAL LOW (ref 36.0–46.0)
Hemoglobin: 10.1 g/dL — ABNORMAL LOW (ref 12.0–15.0)
MCH: 23.5 pg — AB (ref 26.0–34.0)
MCHC: 31.1 g/dL (ref 30.0–36.0)
MCV: 75.6 fL — ABNORMAL LOW (ref 78.0–100.0)
Platelets: 351 10*3/uL (ref 150–400)
RBC: 4.3 MIL/uL (ref 3.87–5.11)
RDW: 16.2 % — ABNORMAL HIGH (ref 11.5–15.5)
WBC: 7.2 10*3/uL (ref 4.0–10.5)

## 2013-11-29 LAB — LIPID PANEL
Cholesterol: 199 mg/dL (ref 0–200)
HDL: 68 mg/dL (ref >39)
LDL CALC: 118 mg/dL — AB (ref 0–99)
Total CHOL/HDL Ratio: 2.9 RATIO
Triglycerides: 65 mg/dL (ref <150)
VLDL: 13 mg/dL (ref 0–40)

## 2013-11-29 LAB — PROTIME-INR
INR: 1.03 (ref 0.00–1.49)
PROTHROMBIN TIME: 13.3 s (ref 11.6–15.2)

## 2013-11-29 LAB — TROPONIN I: Troponin I: <0.3 ng/mL (ref <0.30)

## 2013-11-29 LAB — BASIC METABOLIC PANEL
BUN: 8 mg/dL (ref 6–23)
CALCIUM: 8.7 mg/dL (ref 8.4–10.5)
CO2: 22 meq/L (ref 19–32)
Chloride: 105 mEq/L (ref 96–112)
Creatinine, Ser: 0.79 mg/dL (ref 0.50–1.10)
GFR calc Af Amer: >90 mL/min (ref >90)
GFR calc non Af Amer: >90 mL/min (ref >90)
Glucose, Bld: 108 mg/dL — ABNORMAL HIGH (ref 70–99)
Potassium: 4 mEq/L (ref 3.7–5.3)
SODIUM: 139 meq/L (ref 137–147)

## 2013-11-29 LAB — PRO B NATRIURETIC PEPTIDE: PRO B NATRI PEPTIDE: 27.1 pg/mL (ref 0–125)

## 2013-11-29 LAB — TSH: TSH: 1.78 u[IU]/mL (ref 0.350–4.500)

## 2013-11-29 LAB — I-STAT TROPONIN, ED: Troponin i, poc: 0 ng/mL (ref 0.00–0.08)

## 2013-11-29 MED ORDER — ALBUTEROL SULFATE (2.5 MG/3ML) 0.083% IN NEBU
2.5000 mg | INHALATION_SOLUTION | Freq: Four times a day (QID) | RESPIRATORY_TRACT | Status: DC | PRN
  Administered 2013-11-30: 2.5 mg via RESPIRATORY_TRACT
  Filled 2013-11-29: qty 3

## 2013-11-29 MED ORDER — MORPHINE SULFATE 4 MG/ML IJ SOLN
4.0000 mg | Freq: Once | INTRAMUSCULAR | Status: AC
  Administered 2013-11-29: 4 mg via INTRAVENOUS
  Filled 2013-11-29: qty 1

## 2013-11-29 MED ORDER — ASPIRIN EC 81 MG PO TBEC
81.0000 mg | DELAYED_RELEASE_TABLET | Freq: Every day | ORAL | Status: DC
  Administered 2013-11-30 – 2013-12-01 (×2): 81 mg via ORAL
  Filled 2013-11-29 (×2): qty 1

## 2013-11-29 MED ORDER — QUETIAPINE FUMARATE ER 400 MG PO TB24
400.0000 mg | ORAL_TABLET | Freq: Every day | ORAL | Status: DC
  Administered 2013-11-29 – 2013-11-30 (×2): 400 mg via ORAL
  Filled 2013-11-29 (×4): qty 1

## 2013-11-29 MED ORDER — SODIUM CHLORIDE 0.9 % IV BOLUS (SEPSIS)
1000.0000 mL | Freq: Once | INTRAVENOUS | Status: AC
  Administered 2013-11-29: 1000 mL via INTRAVENOUS

## 2013-11-29 MED ORDER — ASPIRIN 81 MG PO CHEW: 324.0000 mg | CHEWABLE_TABLET | Freq: Once | ORAL | Status: DC

## 2013-11-29 MED ORDER — ATENOLOL 50 MG PO TABS
50.0000 mg | ORAL_TABLET | Freq: Two times a day (BID) | ORAL | Status: DC
  Administered 2013-11-29 – 2013-12-01 (×4): 50 mg via ORAL
  Filled 2013-11-29 (×5): qty 1

## 2013-11-29 MED ORDER — TRAZODONE HCL 100 MG PO TABS
200.0000 mg | ORAL_TABLET | Freq: Every day | ORAL | Status: DC
  Administered 2013-11-29 – 2013-11-30 (×2): 200 mg via ORAL
  Filled 2013-11-29 (×3): qty 2

## 2013-11-29 MED ORDER — QUETIAPINE FUMARATE ER 50 MG PO TB24: 150.0000 mg | ORAL_TABLET | Freq: Every day | ORAL | Status: DC

## 2013-11-29 MED ORDER — ALBUTEROL SULFATE HFA 108 (90 BASE) MCG/ACT IN AERS: 2.0000 | INHALATION_SPRAY | Freq: Four times a day (QID) | RESPIRATORY_TRACT | Status: DC | PRN

## 2013-11-29 MED ORDER — HYDROCODONE-ACETAMINOPHEN 5-325 MG PO TABS
1.0000 | ORAL_TABLET | ORAL | Status: DC | PRN
  Administered 2013-11-30: 2 via ORAL
  Administered 2013-11-30: 1 via ORAL
  Administered 2013-11-30: 2 via ORAL
  Administered 2013-11-30: 1 via ORAL
  Filled 2013-11-29 (×2): qty 2
  Filled 2013-11-29 (×2): qty 1

## 2013-11-29 MED ORDER — NITROGLYCERIN 0.4 MG SL SUBL
0.4000 mg | SUBLINGUAL_TABLET | SUBLINGUAL | Status: DC | PRN
  Administered 2013-11-29: 0.4 mg via SUBLINGUAL
  Filled 2013-11-29: qty 1

## 2013-11-29 MED ORDER — HEPARIN (PORCINE) IN NACL 100-0.45 UNIT/ML-% IJ SOLN
1250.0000 [IU]/h | INTRAMUSCULAR | Status: DC
  Administered 2013-11-29: 1250 [IU]/h via INTRAVENOUS
  Filled 2013-11-29: qty 250

## 2013-11-29 MED ORDER — HEPARIN SODIUM (PORCINE) 5000 UNIT/ML IJ SOLN
5000.0000 [IU] | Freq: Three times a day (TID) | INTRAMUSCULAR | Status: DC
  Filled 2013-11-29 (×6): qty 1

## 2013-11-29 MED ORDER — FLUTICASONE PROPIONATE HFA 44 MCG/ACT IN AERO
2.0000 | INHALATION_SPRAY | Freq: Two times a day (BID) | RESPIRATORY_TRACT | Status: DC
  Administered 2013-11-29 – 2013-12-01 (×4): 2 via RESPIRATORY_TRACT
  Filled 2013-11-29: qty 10.6

## 2013-11-29 MED ORDER — TRAZODONE HCL 100 MG PO TABS
100.0000 mg | ORAL_TABLET | Freq: Every day | ORAL | Status: DC
  Filled 2013-11-29: qty 1

## 2013-11-29 MED ORDER — PANTOPRAZOLE SODIUM 40 MG PO TBEC
40.0000 mg | DELAYED_RELEASE_TABLET | Freq: Every day | ORAL | Status: DC
  Administered 2013-11-29 – 2013-12-01 (×3): 40 mg via ORAL
  Filled 2013-11-29 (×3): qty 1

## 2013-11-29 MED ORDER — NITROGLYCERIN 0.4 MG SL SUBL: 0.4000 mg | SUBLINGUAL_TABLET | SUBLINGUAL | Status: DC | PRN

## 2013-11-29 MED ORDER — QUETIAPINE FUMARATE ER 50 MG PO TB24
150.0000 mg | ORAL_TABLET | Freq: Every day | ORAL | Status: DC
  Filled 2013-11-29: qty 3

## 2013-11-29 MED ORDER — MORPHINE SULFATE 2 MG/ML IJ SOLN
2.0000 mg | INTRAMUSCULAR | Status: DC | PRN
  Administered 2013-11-29: 2 mg via INTRAVENOUS
  Filled 2013-11-29: qty 1

## 2013-11-29 MED ORDER — ARIPIPRAZOLE 15 MG PO TABS
15.0000 mg | ORAL_TABLET | Freq: Every day | ORAL | Status: DC
  Administered 2013-11-29 – 2013-11-30 (×2): 15 mg via ORAL
  Filled 2013-11-29 (×3): qty 1

## 2013-11-29 MED ORDER — ONDANSETRON HCL 4 MG/2ML IJ SOLN
4.0000 mg | Freq: Once | INTRAMUSCULAR | Status: AC
  Administered 2013-11-29: 4 mg via INTRAVENOUS
  Filled 2013-11-29: qty 2

## 2013-11-29 MED ORDER — HEPARIN BOLUS VIA INFUSION
4000.0000 [IU] | Freq: Once | INTRAVENOUS | Status: AC
  Administered 2013-11-29: 4000 [IU] via INTRAVENOUS
  Filled 2013-11-29: qty 4000

## 2013-11-29 NOTE — ED Notes (Signed)
Sudden onset of cp ~ 30 minutes ago; substernal - pressure sharp; staff-diaphoresis, nausea; came from [facility]. bp 205/120, hr 78, rr wnl; ems gave 324 mg asa,

## 2013-11-29 NOTE — Consult Note (Signed)
CONSULT NOTE  Date: 11/29/2013               Patient Name:  Patricia [name] MRN: [id number]  DOB: 03/06/1971 Age / Sex: 43 y.o., female        PCP: No PCP Per Patient Primary Cardiologist: New / Eusebio Blazejewski            Referring Physician: Cleora Fleet              Reason for Consult: Chest pain            History of Present Illness: Patient is a 43 y.o. female with a PMHx of chest pain with multiple emergency room visits (43)  over the past several years, hypertension, asthma, hyperlipidemia, gastroesophageal reflux disease, bipolar/depression who was admitted to [facility] on 11/29/2013 for evaluation of chest discomfort.   The patient was at her NPs office. Sudden onset of sharp CP, associated with j bilateral jaw tightness, mouth watering.  She ate cabbage with onions last night, along with  fat back, corn bread, ice cream, and cookies and then she went to bed.  At 11 AM she had sharp CP, initially thought it was indigestion.  As the pain worsened, she was noticed to be feeling uncomfortable.    The pain lasted for about 1 hour and was relieved here in the ER with IV morphine and SL NTG.   She is a former smoker - does not smoke now Occasional ETOH   + hx of CVA on her fathers side.    Medications: Outpatient medications:  (Not in a hospital admission)  Current medications: Current Facility-Administered Medications  Medication Dose Route Frequency Provider Last Rate Last Dose  . aspirin chewable tablet 324 mg  324 mg Oral Once Mariea Clonts, MD      . morphine 2 MG/ML injection 2 mg  2 mg Intravenous Q3H PRN Tawanna Sat, MD   2 mg at 11/29/13 1605  . nitroGLYCERIN (NITROSTAT) SL tablet 0.4 mg  0.4 mg Sublingual Q5 min PRN Tawanna Sat, MD   0.4 mg at 11/29/13 1606   Current Outpatient Prescriptions  Medication Sig Dispense Refill  . albuterol (PROVENTIL) (2.5 MG/3ML) 0.083% nebulizer solution Take 2.5 mg by nebulization every 6 (six) hours as needed for shortness of  breath.      . ARIPiprazole (ABILIFY) 15 MG tablet Take 15 mg by mouth at bedtime.       Marland Kitchen atenolol (TENORMIN) 50 MG tablet Take 50 mg by mouth 2 (two) times daily.      Marland Kitchen loratadine (CLARITIN) 10 MG tablet Take 10 mg by mouth daily.      . QUEtiapine Fumarate (SEROQUEL XR) 150 MG 24 hr tablet Take 150 mg by mouth at bedtime.      . SUMAtriptan (IMITREX) 50 MG tablet Take 50 mg by mouth every 2 (two) hours as needed for migraine.      . traZODone (DESYREL) 100 MG tablet Take 100 mg by mouth at bedtime.      Marland Kitchen albuterol (PROVENTIL HFA;VENTOLIN HFA) 108 (90 BASE) MCG/ACT inhaler Inhale 2 puffs into the lungs every 6 (six) hours as needed for shortness of breath.       . esomeprazole (NEXIUM) 40 MG capsule Take 40 mg by mouth at bedtime.          Allergies  Allergen Reactions  . Peanut-Containing Drug Products Anaphylaxis    Peanut Oil Only     Past Medical  History  Diagnosis Date  . Migraines   . Asthma   . Bronchitis   . Bipolar 1 disorder   . Depression   . Hypertension     Past Surgical History  Procedure Laterality Date  . Fluid removal      brain  . Brain surgery    . Fracture surgery    . Tubal ligation      Family History  Problem Relation Age of Onset  . Cancer Other   . Diabetes Other     Social History:  reports that she has never smoked. She has never used smokeless tobacco. She reports that she drinks alcohol. She reports that she does not use illicit drugs.   Review of Systems: Constitutional:  denies fever, chills, diaphoresis, appetite change and fatigue.  HEENT: denies photophobia, eye pain, redness, hearing loss, ear pain, congestion, sore throat, rhinorrhea, sneezing, neck pain, neck stiffness and tinnitus.  Respiratory: denies SOB, DOE, cough, chest tightness, and wheezing.  Cardiovascular: admits to chest pain,   Gastrointestinal: admits to nausea, vomiting, abdominal pain, diarrhea, constipation, blood in stool.  Genitourinary: denies dysuria,  urgency, frequency, hematuria, flank pain and difficulty urinating.  Musculoskeletal: denies  myalgias, back pain, joint swelling, arthralgias and gait problem.   Skin: denies pallor, rash and wound.  Neurological: denies dizziness, seizures, syncope, weakness, light-headedness, numbness and headaches.   Hematological: denies adenopathy, easy bruising, personal or family bleeding history.  Psychiatric/ Behavioral: denies suicidal ideation, mood changes, confusion, nervousness, sleep disturbance and agitation.    Physical Exam: BP 149/86  Pulse 86  Temp(Src) 97.8 F (36.6 C) (Oral)  Resp 14  Ht 5\' 4"  (1.626 m)  Wt 258 lb (117.028 kg)  BMI 44.26 kg/m2  SpO2 100%  LMP 10/26/2013  Wt Readings from Last 3 Encounters:  11/29/13 258 lb (117.028 kg)  03/09/13 243 lb (110.224 kg)  12/14/12 247 lb 6 oz (112.209 kg)    General: Vital signs reviewed and noted. Obese, anxious female.    Head: Normocephalic, atraumatic, sclera anicteric,   Neck: Supple. Negative for carotid bruits. No JVD   Lungs:  Clear bilaterally, no  wheezes, rales, or rhonchi. Breathing is normal   Heart: RRR with S1 S2. No murmurs, rubs, or gallops   Abdomen:  Soft, non-tender, obese, nontender  MSK: Strength and the appear normal for age.   Extremities: No clubbing or cyanosis. No edema.  Distal pedal pulses are 2+ and equal   Neurologic: Alert and oriented X 3. Moves all extremities spontaneously.  Psych: Responds to questions appropriately with a normal affect.     Lab results: Basic Metabolic Panel:  Recent Labs Lab 11/29/13 1204  NA 139  K 4.0  CL 105  CO2 22  GLUCOSE 108*  BUN 8  CREATININE 0.79  CALCIUM 8.7    Liver Function Tests: No results found for this basename: AST, ALT, ALKPHOS, BILITOT, PROT, ALBUMIN,  in the last 168 hours No results found for this basename: LIPASE, AMYLASE,  in the last 168 hours No results found for this basename: AMMONIA,  in the last 168 hours  CBC:  Recent  Labs Lab 11/29/13 1204  WBC 7.2  HGB 10.1*  HCT 32.5*  MCV 75.6*  PLT 351    Cardiac Enzymes: No results found for this basename: CKTOTAL, CKMB, CKMBINDEX, TROPONINI,  in the last 168 hours  BNP: No components found with this basename: POCBNP,   CBG: No results found for this basename: GLUCAP,  in the last  168 hours  Coagulation Studies: No results found for this basename: LABPROT, INR,  in the last 72 hours   Other results:  EKG :  NSR with nonspecific ST changes.    Imaging: Dg Chest 2 View  11/29/2013   CLINICAL DATA:  Chest pain  EXAM: CHEST  2 VIEW  COMPARISON:  09/21/2013  FINDINGS: Midline trachea. Normal heart size and mediastinal contours. No pleural effusion or pneumothorax. Mildly low lung volumes. Clear lungs.  IMPRESSION: No acute cardiopulmonary disease.   Electronically Signed   By: Abigail Miyamoto M.D.   On: 11/29/2013 14:00      Assessment & Plan:  1. Atypical chest pain:   patient's chest pain is very atypical. It does not sound cardiac at all. She ate lots of fatty foods and food typically been known to cause indigestion last night and this morning woke up with what she thought was indigestion pain.  Despite having chest pain for an hour her troponin levels and pro-B. natruretic peptide levels are normal.    Part score is 3  She gets one point for nonspecific ST changes,one point for risk factors. And I'll give her 1 point for the chest pain although It sounds  very atypical to me.  Her pre-test probability of CAD is low.  If she rules out for myocardial infarction I think she can be safely discharged home.  With her body habitus, she has a higher than normal likelyhood of having a false positive from breast attenuation.   If  she has any further episodes of pain she should get a [employer] - it should be a 2 day study.    2. HTN:  She eats an unrestricted diet.  She does take her meds.  She will need to follow up with her medical doctor for further  recs.   Following .   Thayer Headings, Brooke Bonito., MD, [facility] 11/29/2013, 5:45 PM Office - [phone] Pager [phone]

## 2013-11-29 NOTE — ED Provider Notes (Signed)
CSN: [id number]     Arrival date & time 11/29/13  1148 History   First MD Initiated Contact with Patient 11/29/13 1153     Chief Complaint  Patient presents with  . Chest Pain     (Consider location/radiation/quality/duration/timing/severity/associated sxs/prior Treatment) The history is provided by the patient.   history of present illness: 43 year old female who presents with chief complaint of chest pain. Patient was at her PCP office when she reported sudden onset of sharp and "stabbing" chest pain just to the right of the sternum. She also felt pain behind both of her ears. She had some associated lightheadedness that has now resolved and shortness of breath. She was given aspirin and nitroglycerin prior arrival with some improvement in pain. She denies any recent history of fever, cough or wheezing. No prior history of blood clot. No recent travel, immobilization, or surgery.  Past Medical History  Diagnosis Date  . Migraines   . Asthma   . Bronchitis   . Bipolar 1 disorder   . Depression   . Hypertension    Past Surgical History  Procedure Laterality Date  . Fluid removal      brain  . Brain surgery    . Fracture surgery    . Tubal ligation     Family History  Problem Relation Age of Onset  . Cancer Other   . Diabetes Other    History  Substance Use Topics  . Smoking status: Never Smoker   . Smokeless tobacco: Never Used  . Alcohol Use: Yes     Comment: occassionally   OB History   Grav Para Term Preterm Abortions TAB SAB Ect Mult Living   5 5 5             Review of Systems  Constitutional: Negative for fever and chills.  HENT: Negative for congestion.   Eyes: Negative for pain.  Respiratory: Negative for shortness of breath.   Cardiovascular: Positive for chest pain. Negative for leg swelling.  Gastrointestinal: Negative for nausea, vomiting, abdominal pain, diarrhea and constipation.  Genitourinary: Negative for dysuria.  Musculoskeletal: Negative for  back pain.  Skin: Negative for rash and wound.  Neurological: Negative for headaches.  All other systems reviewed and are negative.     Allergies  Peanut-containing drug products  Home Medications   Prior to Admission medications   Medication Sig Start Date End Date Taking? Authorizing Provider  albuterol (PROVENTIL HFA;VENTOLIN HFA) 108 (90 BASE) MCG/ACT inhaler Inhale 2 puffs into the lungs every 6 (six) hours as needed for shortness of breath.     Historical Provider, MD  albuterol (PROVENTIL) (2.5 MG/3ML) 0.083% nebulizer solution Take 2.5 mg by nebulization every 6 (six) hours as needed for shortness of breath.    Historical Provider, MD  ARIPiprazole (ABILIFY) 15 MG tablet Take 15 mg by mouth at bedtime.     Historical Provider, MD  atenolol (TENORMIN) 50 MG tablet Take 50 mg by mouth 2 (two) times daily.    Historical Provider, MD  esomeprazole (NEXIUM) 40 MG capsule Take 40 mg by mouth at bedtime.     Historical Provider, MD  QUEtiapine Fumarate (SEROQUEL XR) 150 MG 24 hr tablet Take 150 mg by mouth at bedtime.    Historical Provider, MD  SUMAtriptan (IMITREX) 50 MG tablet Take 50 mg by mouth every 2 (two) hours as needed for migraine.    Historical Provider, MD  traZODone (DESYREL) 100 MG tablet Take 100 mg by mouth at bedtime.  Historical Provider, MD   BP 147/104  Pulse 77  Temp(Src) 97.7 F (36.5 C) (Oral)  Resp 16  Ht 5\' 4"  (1.626 m)  Wt 258 lb (117.028 kg)  BMI 44.26 kg/m2  SpO2 99%  LMP 10/26/2013 Physical Exam  Nursing note and vitals reviewed. Constitutional: She is oriented to person, place, and time. She appears well-developed and well-nourished. No distress.  HENT:  Head: Normocephalic and atraumatic.  Eyes: Conjunctivae are normal.  Neck: Neck supple.  Cardiovascular: Normal rate, regular rhythm, normal heart sounds and intact distal pulses.   Pulmonary/Chest: Effort normal and breath sounds normal. She has no wheezes. She has no rales. She exhibits  tenderness (to the right of the sternum at location of chest pain).  Abdominal: Soft. She exhibits no distension. There is no tenderness.  Musculoskeletal: Normal range of motion.  Neurological: She is alert and oriented to person, place, and time.  Skin: Skin is warm and dry.    ED Course  Procedures (including critical care time) Labs Review Labs Reviewed  CBC - Abnormal; Notable for the following:    Hemoglobin 10.1 (*)    HCT 32.5 (*)    MCV 75.6 (*)    MCH 23.5 (*)    RDW 16.2 (*)    All other components within normal limits  BASIC METABOLIC PANEL - Abnormal; Notable for the following:    Glucose, Bld 108 (*)    All other components within normal limits  I-STAT TROPOININ, ED    Imaging Review No results found.   EKG Interpretation   Date/Time:  Monday Nov 29 2013 12:02:49 EDT Ventricular Rate:  72 PR Interval:  139 QRS Duration: 82 QT Interval:  428 QTC Calculation: 468 R Axis:   25 Text Interpretation:  Sinus rhythm Abnormal R-wave progression, early  transition Borderline T wave abnormalities Confirmed by ZAVITZ  MD, JOSHUA  ([id number]) on 11/29/2013 12:21:36 PM      MDM   Final diagnoses:  Chest pain    43 year old female with history of hypertension, asthma who presents with a central, sharp substernal chest pain with radiation to the neck. Pain improved after aspirin and nitroglycerin. Her blood pressure 147/104 with vital signs otherwise stable. Pain partially but not completely reproduced with AP compression of the chest.  Concern for ACS. PERC negative and doubt PE. EKG with concern for T-wave inversions in the inferior leads. No prior EKG for comparison.  Labs obtained and initial troponin negative. Do to concern for EKG changes and patient without good followup, medicine was consult in and patient will be admitted to the family medicine service for further management.    Renaldo Reel, MD 11/29/13 [phone]

## 2013-11-29 NOTE — ED Notes (Signed)
Patient C/O 7/10 pain but is on the phone talking and laughing with her sister.

## 2013-11-29 NOTE — Progress Notes (Signed)
Pt has refused use of cpap at this time.  She says she used to wear one but hasn't in over two years.  Rt explained it is available if she changes her mind.

## 2013-11-29 NOTE — ED Notes (Signed)
Admitting MD's at the bedside.  Pt. Continues to have chest pain 9/10  MD aware.

## 2013-11-29 NOTE — Telephone Encounter (Signed)
Patient no showed for appt today, per chart review patient currently in ER for chest pain. Will send letter

## 2013-11-29 NOTE — Progress Notes (Signed)
ANTICOAGULATION CONSULT NOTE - Initial Consult  Pharmacy Consult for Heparin Indication: chest pain/ACS  Allergies  Allergen Reactions  . Peanut-Containing Drug Products Anaphylaxis    Peanut Oil Only    Patient Measurements: Height: 5\' 4"  (162.6 cm) Weight: 258 lb (117.028 kg) IBW/kg (Calculated) : 54.7 Heparin Dosing Weight: 85 kg  Vital Signs: Temp: 98.2 F (36.8 C) (05/04 1916) Temp src: Oral (05/04 1916) BP: 145/89 mmHg (05/04 1916) Pulse Rate: 97 (05/04 1916)  Labs:  Recent Labs  11/29/13 1204  HGB 10.1*  HCT 32.5*  PLT 351  CREATININE 0.79    Estimated Creatinine Clearance: 113.9 ml/min (by C-G formula based on Cr of 0.79).   Medical History: Past Medical History  Diagnosis Date  . Migraines   . Asthma   . Bronchitis   . Bipolar 1 disorder   . Depression   . Hypertension    Assessment: 43 year old female beginning heparin for CP. Renal function is stable Planning a myoview in AM if she has further episodes of CP  Goal of Therapy:  Heparin level 0.3-0.7 units/ml Monitor platelets by anticoagulation protocol: Yes   Plan:  1) Heparin 4000 units iv bolus x 1 2) Heparin drip at 1250 units / hr 3) Daily heparin level, CBC  Thank you. Anette Guarneri, PharmD [phone]  Tad Moore 11/29/2013,7:27 PM

## 2013-11-29 NOTE — ED Provider Notes (Signed)
Medical screening examination/treatment/procedure(s) were conducted as a shared visit with non-physician practitioner(s) or resident  and myself.  I personally evaluated the patient during the encounter and agree with the findings and plan unless otherwise indicated.    I have personally reviewed any xrays and/ or EKG's with the provider and I agree with interpretation.   Patient with high blood pressure and asthma history presents with sharp anterior chest pain and radiation bilateral jaw. No known heart or blood clot history.Patient denies blood clot history, active cancer, recent major trauma or surgery, unilateral leg swelling/ pain, recent long travel, hemoptysis or oral contraceptives. Patient had aspirin. EKG reviewed with nonspecific T wave inversions. No old EKG to compare with.  Mild improvement of symptoms and ED however with no outpatient followup and unsure if these EKG changes are related chest pain plan for telemetry FOR cardiac rule out.  Chest pain, EKG abnormality  Mariea Clonts, MD 11/29/13 1600

## 2013-11-29 NOTE — H&P (Signed)
[facility] Admission History and Physical Service Pager: [phone]  Patient name: Patricia [name] Medical record number: [id number] Date of birth: 1971/04/30 Age: 43 y.o. Gender: female  Primary Care Provider: No PCP Per Patient Consultants: Cardiology Code Status: Full  Chief Complaint: chest pain  Assessment and Plan: Patricia [name] is a 43 y.o. female presenting with chest pain concerning for unstable angina . PMH is significant for hypertension, hyperlipidemia, asthma, chronic bronchitis, OSA, migraines, depression, Bipolar 1  # Unstable angina: story is concerning given risk factors, pain very exacerbated while standing and walking on exam. HEART score 5, TIMI 3, Wells score 0. Vitals stable on admission, CBC with apparent stable anemia hgb 10.1 (baseline 10.5-11.8), Bmet wnl. EKG with questionable T wave inversion III, aVF, V3; poor R wave progression; no prior EKG to compare to. CXR is normal appearing, i-Stat troponin negative. Received ASA (325mg ) in clinic before coming to ED.  - admit to SDU with telemetry (patient with persistent CP of 9/10 in ED) - continue ASA 81mg  in AM.  Holding Plavix until cardiology evaluates and recommends management - Morphine 2mg  q3hrs PRN - Nitro SL 0.4mg  q35mins x 3 for CP - will start heparin drip per pharm for concerning ACS - cycle troponins x 3, additional labs = TSH, A1c - appreciate cards consult - NPO pending decision on further management by cards - EKG in AM  # Hypertension: BP currently stable in SBP 140s - continue atenolol 50mg  BID  # Asthma: not currently in any exacerbation - continue home QVAR and albuterol PRN - Avoid non selective Beta blockers in this pt with CP and hx of asthma/chronic bronchitis - May warrant outpatient pulmonology follow up and PFT testing as well as OSA evaluation again   # Hyperlipidemia: patient mentions prior diagnosis of HLD but not currently on a statin - will get  lipid panel and likely start statin  # Bipolar/depression:  Stable currently, not manic or depressed - will continue home abilify, seroquel, trazodone  # GERD - on nexium at home, will switch to hospital formulary   # History of migraines: - hold sumatriptan in setting of CP, will need to consider alternative if develops migraine  # OSA - Per pt, hx of OSA, not on CPAP since 2012 - CPAP while inpatient - May need home health order for this prior to d/c   FEN/GI: NPO  Prophylaxis: full dose heparin  Disposition: SDU with telemetry   History of Present Illness: Patricia [name] is a 43 y.o. female presenting with worsening chest pressure beginning this AM.  Pt woke up in her normal state of health this AM and went to a underserved medication distributor around 11 AM.  At that time, she started to have some chest pressure she told the nurse, who gave her ASA and called 911.  Pt also c/o some neck radiation at that time which has eased up slightly since then.  Pt transported to the ED and over the last 3-4 hours, her chest pressure has pretty much remained constant.  Associated Sx include dizziness, but denies any dyspepsia, shortness of breath, HA, blurred vision, N/V/D.  She does have PMHx to include asthma/chornic bronchitis which she takes Qvar/Albuterol, OSA in which she does not have her CPAP machine since moving to [place] from [place] in 2012, Bipolar and anxiety on which she is on Seroquel/Abilify/Trazadone, Migraines on Atenolol/Imitrex, HTN on atenolol, GERD on Nexium.   In the ED, pt had multiple evaluations  performed including EKG showing NSR w/ possible TWI, POC troponin which is negative, CBC showing Hgb of 10.1 (chronic per pt), BMP which is normal, CXR showing no acute cardiopulmonary disease.  She received ASA 325 mg x 1 at the clinic prior to transfer to the ED, morphine x 1 that helped relieved her rest but no NTG.    Pt smoked maybe about 1 pack every other week for 25 yrs (quit 10  yrs ago), drinks about one shot every 3-4 weeks, and smokes THC about 3 x every week.  Review Of Systems: Per HPI   Patient Active Problem List   Diagnosis Date Noted  . Chest pain 11/29/2013  . OBSTRUCTIVE SLEEP APNEA 04/05/2008  . MIGRAINE HEADACHE 04/05/2008  . DIABETES MELLITUS, TYPE II 03/21/2008  . BIPOLAR DISORDER UNSPECIFIED 03/21/2008  . ANXIETY 03/21/2008  . DEPRESSION 03/21/2008  . BRONCHITIS, CHRONIC 03/21/2008  . ASTHMA 03/21/2008  . ASTHMA, WITH ACUTE EXACERBATION 03/21/2008  . VAGINAL BLEEDING 03/21/2008   Past Medical History: Past Medical History  Diagnosis Date  . Migraines   . Asthma   . Bronchitis   . Bipolar 1 disorder   . Depression   . Hypertension    Past Surgical History: Past Surgical History  Procedure Laterality Date  . Fluid removal      brain  . Brain surgery    . Fracture surgery    . Tubal ligation     Social History: History  Substance Use Topics  . Smoking status: Never Smoker   . Smokeless tobacco: Never Used  . Alcohol Use: Yes     Comment: occassionally    Family History: Family History  Problem Relation Age of Onset  . Cancer Other   . Diabetes Other    Allergies and Medications: Allergies  Allergen Reactions  . Peanut-Containing Drug Products Anaphylaxis    Peanut Oil Only   No current facility-administered medications on file prior to encounter.   Current Outpatient Prescriptions on File Prior to Encounter  Medication Sig Dispense Refill  . albuterol (PROVENTIL) (2.5 MG/3ML) 0.083% nebulizer solution Take 2.5 mg by nebulization every 6 (six) hours as needed for shortness of breath.      . ARIPiprazole (ABILIFY) 15 MG tablet Take 15 mg by mouth at bedtime.       Marland Kitchen atenolol (TENORMIN) 50 MG tablet Take 50 mg by mouth 2 (two) times daily.      . QUEtiapine Fumarate (SEROQUEL XR) 150 MG 24 hr tablet Take 150 mg by mouth at bedtime.      . SUMAtriptan (IMITREX) 50 MG tablet Take 50 mg by mouth every 2 (two) hours as  needed for migraine.      . traZODone (DESYREL) 100 MG tablet Take 100 mg by mouth at bedtime.      Marland Kitchen albuterol (PROVENTIL HFA;VENTOLIN HFA) 108 (90 BASE) MCG/ACT inhaler Inhale 2 puffs into the lungs every 6 (six) hours as needed for shortness of breath.       . esomeprazole (NEXIUM) 40 MG capsule Take 40 mg by mouth at bedtime.         Objective: BP 149/86  Pulse 86  Temp(Src) 97.8 F (36.6 C) (Oral)  Resp 14  Ht 5\' 4"  (1.626 m)  Wt 258 lb (117.028 kg)  BMI 44.26 kg/m2  SpO2 100%  LMP 10/26/2013 Exam: General: NAD, speaks quickly at times HEENT: PERRL, EOMI. MMM. Neck: + acanthosis, no JVD  Cardiovascular: RRR, normal s1/s2 though slightly distant, no murmurs appreciated.  2+ PT pulses bilaterally. Chest wall with some worsened pain with palpation of mid sternum, under right breast Respiratory: CTAB, air movement slightly diminished Abdomen: obese, soft, nontender (including epigastrium), normal bowel sounds Extremities: no edema or cyanosis. WWP. Skin: warm and dry Neuro: alert and oriented x 3, CN2-12 grossly normal. Gait is normal but limited by chest pain (took 2-3 steps away from bed)  Labs and Imaging: CBC BMET   Recent Labs Lab 11/29/13 1204  WBC 7.2  HGB 10.1*  HCT 32.5*  PLT 351    Recent Labs Lab 11/29/13 1204  NA 139  K 4.0  CL 105  CO2 22  BUN 8  CREATININE 0.79  GLUCOSE 108*  CALCIUM 8.7     Dg Chest 2 View  11/29/2013   CLINICAL DATA:  Chest pain  EXAM: CHEST  2 VIEW  COMPARISON:  09/21/2013  FINDINGS: Midline trachea. Normal heart size and mediastinal contours. No pleural effusion or pneumothorax. Mildly low lung volumes. Clear lungs.  IMPRESSION: No acute cardiopulmonary disease.   Electronically Signed   By: Abigail Miyamoto M.D.   On: 11/29/2013 14:00   EKG - TWI in II, possible III, V3.  No previous to compare  Tawanna Sat, MD 11/29/2013, 3:34 PM PGY-1, [place] [facility] pager: [phone], text pages welcome   I have  seen and evaluated the pt with Dr. Lamar Benes, please refer to his excellent H&P above for further information.  My additions are in red.  Tamela Oddi Awanda Mink, DO of [facility] 11/29/2013, 4:11 PM

## 2013-11-30 ENCOUNTER — Observation Stay (HOSPITAL_COMMUNITY): Payer: Self-pay

## 2013-11-30 DIAGNOSIS — F329 Major depressive disorder, single episode, unspecified: Secondary | ICD-10-CM

## 2013-11-30 DIAGNOSIS — F3289 Other specified depressive episodes: Secondary | ICD-10-CM

## 2013-11-30 LAB — TROPONIN I

## 2013-11-30 LAB — RAPID URINE DRUG SCREEN, HOSP PERFORMED
Amphetamines: NOT DETECTED
BARBITURATES: NOT DETECTED
BENZODIAZEPINES: NOT DETECTED
Cocaine: NOT DETECTED
Opiates: POSITIVE — AB
Tetrahydrocannabinol: POSITIVE — AB

## 2013-11-30 LAB — HEMOGLOBIN A1C
HEMOGLOBIN A1C: 6.4 % — AB (ref <5.7)
Mean Plasma Glucose: 137 mg/dL — ABNORMAL HIGH (ref <117)

## 2013-11-30 MED ORDER — REGADENOSON 0.4 MG/5ML IV SOLN
INTRAVENOUS | Status: AC
  Administered 2013-11-30: 0.4 mg via INTRAVENOUS
  Filled 2013-11-30: qty 5

## 2013-11-30 MED ORDER — TECHNETIUM TC 99M SESTAMIBI GENERIC - CARDIOLITE
30.0000 | Freq: Once | INTRAVENOUS | Status: AC | PRN
  Administered 2013-11-30: 30 via INTRAVENOUS

## 2013-11-30 MED ORDER — ONDANSETRON HCL 4 MG/2ML IJ SOLN
4.0000 mg | Freq: Four times a day (QID) | INTRAMUSCULAR | Status: DC | PRN
  Administered 2013-11-30: 4 mg via INTRAVENOUS
  Filled 2013-11-30: qty 2

## 2013-11-30 MED ORDER — REGADENOSON 0.4 MG/5ML IV SOLN
0.4000 mg | Freq: Once | INTRAVENOUS | Status: AC
  Administered 2013-11-30: 0.4 mg via INTRAVENOUS
  Filled 2013-11-30: qty 5

## 2013-11-30 MED ORDER — ONDANSETRON HCL 4 MG PO TABS
4.0000 mg | ORAL_TABLET | Freq: Three times a day (TID) | ORAL | Status: DC | PRN
  Administered 2013-11-30: 4 mg via ORAL
  Filled 2013-11-30: qty 1
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
## 2013-11-30 NOTE — Progress Notes (Signed)
Patient continuing to refuse CPAP. Patient made aware to call if she changed her mind.

## 2013-11-30 NOTE — Progress Notes (Signed)
Family Medicine Teaching Service Daily Progress Note Intern Pager: [phone]  Patient name: Patricia [name] Medical record number: [id number] Date of birth: 09/15/1970 Age: 43 y.o. Gender: female  Primary Care Provider: No PCP Per Patient Consultants: Cardiology Code Status: Full  Assessment and Plan: Patricia [name] is a 43 y.o. female presenting with chest pain concerning for unstable angina . PMH is significant for hypertension, hyperlipidemia, asthma, chronic bronchitis, OSA, migraines, depression, Bipolar 1   # Unstable angina: story is concerning given risk factors, pain very exacerbated while standing and walking on exam. HEART score 5, TIMI 3, Wells score 0. Vitals stable on admission, CBC with apparent stable anemia hgb 10.1 (baseline 10.5-11.8), Bmet wnl. EKG with questionable T wave inversion III, aVF, V3; poor R wave progression; no prior EKG to compare to. CXR is normal appearing, i-Stat troponin negative. Received ASA (325mg ) in clinic before coming to ED.  - continue ASA 81mg  in AM.  - norco 5-325mg  1-2tab q4hrs PRN  - troponin neg x 2 (noon and 9pm) - EKG this AM appears unchanged  - appreciate cards consult: believe low pretest probability of being CAD, if has further episodes in the future should get a 2 day lexiscan myoview  # Hypertension: BP currently stable in SBP 140s  - continue atenolol 50mg  BID   # Asthma: not currently in any exacerbation  - continue home QVAR and albuterol PRN  - Avoid non selective Beta blockers in this pt with CP and hx of asthma/chronic bronchitis  - May warrant outpatient pulmonology follow up and PFT testing as well as OSA evaluation again   # Hyperlipidemia: patient mentions prior diagnosis of HLD but not currently on a statin  - Lipid panel: Total chol 199, TG 65, HDL 68, LDL 118 - will start lipitor 40mg   # Prediabetes: A1c 6.4  # Bipolar/depression: Stable currently, not manic or depressed  - will continue home abilify,  seroquel, trazodone   # GERD  - on nexium at home, will switch to hospital formulary   # History of migraines:  - hold sumatriptan in setting of CP, will need to consider alternative if develops migraine   # OSA - Per pt, hx of OSA, not on CPAP since 2012  - CPAP while inpatient (patient refused) - May need home health order for this prior to d/c   FEN/GI: heart healthy diet Prophylaxis: heparin sq  Disposition: pending today  Subjective:  CP is gone this morning but has a migraine. Still has some shortness of breath. When getting her up and walking she again complains of reproducing the chest pain but feels better than yesterday  Objective: Temp:  [97.7 F (36.5 C)-98.2 F (36.8 C)] 98.2 F (36.8 C) (05/05 0505) Pulse Rate:  [76-97] 76 (05/05 0505) Resp:  [14-20] 20 (05/05 0505) BP: (137-149)/(78-104) 137/78 mmHg (05/05 0505) SpO2:  [99 %-100 %] 99 % (05/05 0725) Weight:  [258 lb (117.028 kg)-260 lb 12.8 oz (118.298 kg)] 260 lb 12.8 oz (118.298 kg) (05/05 0459) Physical Exam: General: NAD Cardiovascular: RRR, normal s1/s2, no murmurs. 2+ radial and PT pulses b/l Chest: tender to palpation over right sternal border Respiratory: Mild scattered expiratory wheeze but overall sounds clear Abdomen: obese, soft, NTND Extremities: no edema or cyanosis. WWP.  Laboratory:  Recent Labs Lab 11/29/13 1204  WBC 7.2  HGB 10.1*  HCT 32.5*  PLT 351    Recent Labs Lab 11/29/13 1204  NA 139  K 4.0  CL 105  CO2 22  BUN 8  CREATININE 0.79  CALCIUM 8.7  GLUCOSE 108*   Lipid Panel     Component Value Date/Time   CHOL 199 11/29/2013 2113   TRIG 65 11/29/2013 2113   HDL 68 11/29/2013 2113   CHOLHDL 2.9 11/29/2013 2113   VLDL 13 11/29/2013 2113   LDLCALC 118* 11/29/2013 2113   A1c 6.4 TSH 1.780 UDS positive for opiates (received morphine in ED before urine sample taken) and [facility]  Imaging/Diagnostic Tests: Dg Chest 2 View  11/29/2013   CLINICAL DATA:  Chest pain  EXAM: CHEST  2  VIEW  COMPARISON:  09/21/2013  FINDINGS: Midline trachea. Normal heart size and mediastinal contours. No pleural effusion or pneumothorax. Mildly low lung volumes. Clear lungs.  IMPRESSION: No acute cardiopulmonary disease.   Electronically Signed   By: Patricia [name] M.D.   On: 11/29/2013 14:00    Tawanna Sat, MD 11/30/2013, 7:55 AM PGY-1, [place] Intern pager: [phone], text pages welcome

## 2013-11-30 NOTE — H&P (Signed)
FMTS Attending Note  I personally saw and evaluated the patient. The plan of care was discussed with the resident team. I agree with the assessment and plan as documented by the resident. Patient seen at 1000 on 11/30/13.  43 y/o female with PMH HTN, HLD, asthma, OSA, migraines, depression, and Bipolar I presents for evaluation of chest pain. Please refer to resident note for HPI. Patient currently has some substernal chest pain, worse with ambulation, mild sob, no cough, has had associated neck and jay tightening.   Vital: reviewed Gen: pleasant obese AAF, NAD HEENT: normocephalic, PERRL, EOMI, MMM, neck supple, no anterior or posterior lymphadenopathy Cardiac: RRR, S1 and S2 present, no murmurs, no heaves/ thrills, chest pain is reproducible Resp: diffuse wheezes, good air entry in all lung fields Abd: soft, no tenderness, normal bowel sounds Ext: trace LE edema, 2+ radial and DP pulses bilaterally  Reviewed lab work and imaging since time of admission.  Assessment and Plan: 43 y/o female present for evaluation of exertional chest pain. EKG shows Twave inversion in inferior leads. 1. Chest Pain - exertional with EKG changes, cardiac enzymes negative, Cardiology planning for stress test  2. Asthma - may be contributing to chest pain, continue home QVAR and albuterol, consider steroids if respiratory status worsens 3. HTN - controlled with Atenolol 4. HLD - agree with lipid panel 5. Bipolar - continue home medications  Dossie Arbour MD

## 2013-11-30 NOTE — Progress Notes (Signed)
Day one of two day lexiscan completed.  Patricia [name], [facility]

## 2013-11-30 NOTE — Progress Notes (Signed)
Utilization review completed.  

## 2013-11-30 NOTE — Progress Notes (Signed)
FMTS Attending Note  I personally saw and evaluated the patient. The plan of care was discussed with the resident team. I agree with the assessment and plan as documented by the resident.   Vernona Peake MD 

## 2013-12-01 ENCOUNTER — Observation Stay (HOSPITAL_COMMUNITY): Payer: Self-pay

## 2013-12-01 ENCOUNTER — Observation Stay (HOSPITAL_COMMUNITY): Payer: MEDICAID

## 2013-12-01 DIAGNOSIS — R079 Chest pain, unspecified: Secondary | ICD-10-CM

## 2013-12-01 DIAGNOSIS — J42 Unspecified chronic bronchitis: Secondary | ICD-10-CM

## 2013-12-01 MED ORDER — TECHNETIUM TC 99M SESTAMIBI GENERIC - CARDIOLITE
30.0000 | Freq: Once | INTRAVENOUS | Status: AC | PRN
  Administered 2013-12-01: 30 via INTRAVENOUS

## 2013-12-01 MED ORDER — ATORVASTATIN CALCIUM 40 MG PO TABS: 40.0000 mg | ORAL_TABLET | Freq: Every day | ORAL | Status: DC

## 2013-12-01 MED ORDER — ATORVASTATIN CALCIUM 40 MG PO TABS
40.0000 mg | ORAL_TABLET | Freq: Every day | ORAL | Status: DC
  Filled 2013-12-01: qty 1

## 2013-12-01 MED ORDER — METFORMIN HCL 500 MG PO TABS: 500.0000 mg | ORAL_TABLET | Freq: Every day | ORAL | Status: DC

## 2013-12-01 NOTE — Progress Notes (Signed)
Spoke with Dr. Lamar Benes regarding new diabetes. Dr. Lamar Benes does not want education done on glucose checks at this time. States that glucophage will not cause lows. Carroll Kinds RN

## 2013-12-01 NOTE — Progress Notes (Signed)
Family Medicine Teaching Service Daily Progress Note Intern Pager: [phone]  Patient name: Patricia [name] Medical record number: [id number] Date of birth: Nov 05, 1970 Age: 43 y.o. Gender: female  Primary Care Provider: No PCP Per Patient Consultants: Cardiology Code Status: Full  Assessment and Plan: Patricia [name] is a 43 y.o. female presenting with chest pain concerning for unstable angina . PMH is significant for hypertension, hyperlipidemia, asthma, chronic bronchitis, OSA, migraines, depression, Bipolar 1   # Unstable angina: story is concerning given risk factors, pain very exacerbated while standing and walking on exam. HEART score 5, TIMI 3, Wells score 0. Vitals stable on admission, CBC with apparent stable anemia hgb 10.1 (baseline 10.5-11.8), Bmet wnl. EKG with questionable T wave inversion III, aVF, V3; poor R wave progression; no prior EKG to compare to. CXR is normal appearing, i-Stat troponin negative.  - continue ASA 81mg  in AM.  - norco 5-325mg  1-2tab q4hrs PRN  - troponin neg x 2 - appreciate cards consult: believe low pretest probability of being CAD, however with pain on exertion she had a 2 day lexiscan myoview which was negative today  # Hypertension: BP currently stable in SBP 140s  - continue atenolol 50mg  BID   # Asthma: not currently in any exacerbation  - continue home QVAR and albuterol PRN  - Avoid non selective Beta blockers in this pt with CP and hx of asthma/chronic bronchitis  - May warrant outpatient pulmonology follow up and PFT testing as well as OSA evaluation again   # Hyperlipidemia: patient mentions prior diagnosis of HDL but not currently on a statin  - Lipid panel: Total chol 199, TG 65, HDL 68, LDL 118 - will start lipitor 40mg   # Prediabetes: A1c 6.4 - discuss diet/weight management, will defer decision for starting therapy   # Bipolar/depression: Stable currently, not manic or depressed  - will continue home abilify, seroquel,  trazodone   # GERD  - on nexium at home, will switch to hospital formulary   # History of migraines:  - hold sumatriptan in setting of CP, will need to consider alternative if develops migraine   # OSA - Per pt, hx of OSA, not on CPAP since 2012  - CPAP while inpatient (patient refused) - May need home health order for this prior to d/c   FEN/GI: heart healthy diet Prophylaxis: heparin sq  Disposition: pending today  Subjective:  Says she is doing much better. Currently no CP, no SOB. Already went to second day of myoview.  Objective: Temp:  [97.4 F (36.3 C)-98.4 F (36.9 C)] 98.3 F (36.8 C) (05/06 0626) Pulse Rate:  [69-72] 72 (05/06 0626) Resp:  [16-20] 20 (05/06 0626) BP: (107-146)/(71-98) 133/77 mmHg (05/06 0626) SpO2:  [94 %-97 %] 96 % (05/06 0626) Weight:  [256 lb 4.8 oz (116.257 kg)] 256 lb 4.8 oz (116.257 kg) (05/06 [id number]) Physical Exam: General: NAD Cardiovascular: RRR, normal s1/s2, no murmurs. 2+ radial and PT pulses b/l Chest: still tender to palpation over right sternal border, says this is improved Respiratory: Mild scattered inspiratory wheeze, normal effort Abdomen: obese, soft, NTND Extremities: no edema or cyanosis. WWP. No calf tenderness.  Laboratory:  Recent Labs Lab 11/29/13 1204  WBC 7.2  HGB 10.1*  HCT 32.5*  PLT 351    Recent Labs Lab 11/29/13 1204  NA 139  K 4.0  CL 105  CO2 22  BUN 8  CREATININE 0.79  CALCIUM 8.7  GLUCOSE 108*   Lipid Panel  Component Value Date/Time   CHOL 199 11/29/2013 2113   TRIG 65 11/29/2013 2113   HDL 68 11/29/2013 2113   CHOLHDL 2.9 11/29/2013 2113   VLDL 13 11/29/2013 2113   LDLCALC 118* 11/29/2013 2113   A1c 6.4 TSH 1.780 UDS positive for opiates (received morphine in ED before urine sample taken) and [facility]  Imaging/Diagnostic Tests: Dg Chest 2 View  11/29/2013   CLINICAL DATA:  Chest pain  EXAM: CHEST  2 VIEW  COMPARISON:  09/21/2013  FINDINGS: Midline trachea. Normal heart size and mediastinal  contours. No pleural effusion or pneumothorax. Mildly low lung volumes. Clear lungs.  IMPRESSION: No acute cardiopulmonary disease.   Electronically Signed   By: Abigail Miyamoto M.D.   On: 11/29/2013 14:00    Tawanna Sat, MD 12/01/2013, 7:42 AM PGY-1, [place] Intern pager: [phone], text pages welcome

## 2013-12-01 NOTE — Discharge Summary (Signed)
[facility] Discharge Summary  Patient name: Patricia [name] Medical record number: [id number] Date of birth: [birth year] Age: 43 y.o. Gender: female Date of Admission: 11/29/2013  Date of Discharge: 12/01/2013 Admitting Physician: Lupita Dawn, MD  Primary Care Provider: No PCP Per Patient Consultants: none  Indication for Hospitalization: chest pain  Discharge Diagnoses/Problem List:  Chest pain secondary to musculoskeletal pain Hypertension Hyperlipidemia Asthma Chronic bronchitis Obesity OSA Migraines Bipolar 1 depression  Disposition: home  Discharge Condition: stable, improved  Brief Hospital Course:  [name] is a 43 y.o. female was admitted for chest pain concerning for unstable angina. PMH significant for HTN, HLD, asthma, obesity, OSA, bipolar depression. Her history was concerning for worsened chest pain with exertion which was demonstrated by her walking during exam, though also reproducible with palpation. Initial workup was mostly unremarkable with normal Bmet, stable anemia (Hgb 10.1), negative i-stat troponin, EKG with some concern for inverted T waves in III, aVF, V3 but no prior for comparison, CXR with no acute disease. She received ASA, morphine, and pain was relieved with nitro. Cycled troponins remained negative, and in the morning she continued to have chest pain when walking so cardiology decided on 2 day stress myoview. Additional workup included normal TSH, A1c of 6.4, lipid panel as below. Her pain continued to improve over the next day. Stress myoview was negative and she was discharged with the addition of lipitor 40mg  and metformin.  Issues for Follow Up:  1. Prediabetes: discussed diagnosis, lifestyle modifications, patient agreeable to start metformin. Gave instructions for titrating metformin over next week.  Significant Procedures: myoview stress test  Significant Labs and Imaging:   Recent Labs Lab  11/29/13 1204  WBC 7.2  HGB 10.1*  HCT 32.5*  PLT 351    Recent Labs Lab 11/29/13 1204  NA 139  K 4.0  CL 105  CO2 22  GLUCOSE 108*  BUN 8  CREATININE 0.79  CALCIUM 8.7   UDS positive for opiates (received morphine) and THC TSH 1.780 Hemoglobin A1c 6.4 Lipid Panel     Component Value Date/Time   CHOL 199 11/29/2013 2113   TRIG 65 11/29/2013 2113   HDL 68 11/29/2013 2113   CHOLHDL 2.9 11/29/2013 2113   VLDL 13 11/29/2013 2113   LDLCALC 118* 11/29/2013 2113   Dg Chest 2 View  11/29/2013   CLINICAL DATA:  Chest pain  EXAM: CHEST  2 VIEW  COMPARISON:  09/21/2013  FINDINGS: Midline trachea. Normal heart size and mediastinal contours. No pleural effusion or pneumothorax. Mildly low lung volumes. Clear lungs.  IMPRESSION: No acute cardiopulmonary disease.   Electronically Signed   By: Abigail Miyamoto M.D.   On: 11/29/2013 14:00   Nm Myocar Multi W/spect W/wall Motion / Ef  12/01/2013   CLINICAL DATA:  Chest pain and history of hypertension and hyperlipidemia.  EXAM: MYOCARDIAL IMAGING WITH SPECT (REST AND PHARMACOLOGIC-STRESS - 2 DAY PROTOCOL)  GATED LEFT VENTRICULAR WALL MOTION STUDY  LEFT VENTRICULAR EJECTION FRACTION  TECHNIQUE: Standard myocardial SPECT imaging was performed after resting intravenous injection of 10 mCi Tc-25m sestamibi. Subsequently, on a second day, intravenous infusion of Lexiscan was performed under the supervision of the Cardiology staff. At peak effect of the drug, 30 mCi Tc-11m sestamibi was injected intravenously and standard myocardial SPECT imaging was performed. Quantitative gated imaging was also performed to evaluate left ventricular wall motion, and estimate left ventricular ejection fraction.  COMPARISON:  None.  FINDINGS: Utilizing gated data, the end-diastolic  volume is estimated to be 112 mL and the end systolic volume 40 mL. Calculated ejection fraction is 64%.  Gated wall motion analysis is within normal limits with no evidence of regional wall motion  abnormalities.  SPECT imaging shows no evidence of inducible myocardial ischemia or fixed perfusion defects.   IMPRESSION: Normal study demonstrating no evidence of inducible myocardial ischemia. Left ventricular function is normal with quantitative ejection fraction calculation of 64%.   Electronically Signed   By: Aletta Edouard M.D.   On: 12/01/2013 08:43    Results/Tests Pending at Time of Discharge: none  Discharge Medications:    Medication List         albuterol 108 (90 BASE) MCG/ACT inhaler  Commonly known as:  PROVENTIL HFA;VENTOLIN HFA  Inhale 2 puffs into the lungs every 6 (six) hours as needed for shortness of breath.     albuterol (2.5 MG/3ML) 0.083% nebulizer solution  Commonly known as:  PROVENTIL  Take 2.5 mg by nebulization every 6 (six) hours as needed for shortness of breath.     ARIPiprazole 15 MG tablet  Commonly known as:  ABILIFY  Take 15 mg by mouth at bedtime.     atenolol 50 MG tablet  Commonly known as:  TENORMIN  Take 50 mg by mouth 2 (two) times daily.     atorvastatin 40 MG tablet  Commonly known as:  LIPITOR  Take 1 tablet (40 mg total) by mouth daily at 6 PM.     esomeprazole 40 MG capsule  Commonly known as:  NEXIUM  Take 40 mg by mouth at bedtime.     loratadine 10 MG tablet  Commonly known as:  CLARITIN  Take 10 mg by mouth daily.     metFORMIN 500 MG tablet  Commonly known as:  GLUCOPHAGE  Take 1 tablet (500 mg total) by mouth daily with breakfast. 1 tablet daily for 1 week, if not experiencing side effects take twice daily     QUEtiapine 400 MG 24 hr tablet  Commonly known as:  SEROQUEL XR  Take 400 mg by mouth at bedtime.     SUMAtriptan 50 MG tablet  Commonly known as:  IMITREX  Take 50 mg by mouth every 2 (two) hours as needed for migraine.     traZODone 100 MG tablet  Commonly known as:  DESYREL  Take 200 mg by mouth at bedtime.        Discharge Instructions: Please refer to Patient Instructions section of EMR for  full details.  Patient was counseled important signs and symptoms that should prompt return to medical care, changes in medications, dietary instructions, activity restrictions, and follow up appointments.   Follow-Up Appointments: Follow-up Information   Follow up with Tawanna Sat, MD In 2 weeks. (For hospital follow up)    Specialty:  Family Medicine   Contact information:   [place] [address] [phone]     Patient normally goes to another clinic, offered information for [facility] for follow up if she preferred  Tawanna Sat, MD 12/04/2013, 6:01 PM PGY-1, [place]

## 2013-12-01 NOTE — Progress Notes (Signed)
Chaplain responded to spiritual care consult. Patient was with her fiancee, hoping to be discharged soon. She said she is "better." Patient requested prayer for her health and her finances. Chaplain provided emotional support through pastoral presence, empathic listening, and prayer. Please page for follow up.   Ethelene Browns [phone]

## 2013-12-01 NOTE — Discharge Instructions (Addendum)
You were admitted for chest pain that was concerning for cardiac origin. Your EKG, lab tests, and stress test were all normal. The most likely cause of the pain is musculoskeletal, likely inflammation of the muscles/joints of your ribs.  While in the hospital we tested your Hemoglobin A1c, which is a measure of the average blood sugar over the past 3 months. A normal value for this is less than 5.7, diabetes is diagnosed at a level >6.5. Your level was 6.4, which is what we call "prediabetes". The best thing you can do for this is start working on improving your diet, exercise, and weight loss. Your primary doctor will discuss this further with you. We will start a medication called Metformin, take this once a day for 1 week. If you are not experiencing any side effects (stomach/intestine problems), increase to two times a day.  We also tested your cholesterol, which was elevated. Given your diagnosis of prediabetes and the risk factors you have for heart disease, we are recommending and prescribing a cholesterol lowering medication called Atorvastatin (Lipitor).   If you have not already, you should establish with a primary care physician (PCP) to manage your medical problems. You can call our clinic at [facility], or the [facility] line for additional resources  No Primary Care Doctor:  Call [facility] Connect at [phone] - they can help you locate a primary care doctor that accepts your insurance, provides certain services, etc.  Physician Referral [phone] Medication Assistance:  Organization Address Phone Notes  [facility] Medication Assistance Program  [address] [place], [place] [address]  [phone]  --Must be a resident of [facility] [place]  -- Must have NO insurance coverage whatsoever (no Medicaid/ Medicare, etc.)  -- The pt. MUST have a primary care doctor that directs their care regularly and follows them in the community    [facility]   [phone]    [employer]   [phone]    Agencies that provide inexpensive medical care:  Organization Address Phone Notes  [place]   [phone]    [facility]   [phone]    [facility]  [place], [place] [address]  [phone]    [place] [address],  [place]  [phone]    [facility]   [phone]    [facility]   [phone]    [place] [address], [place]  Phone: [phone], Fax: [phone]  Hours of Operation: 9 am - 6 pm, M-F. Also accepts Medicaid/Medicare and self-pay.   [facility] for [place] [address], [address], [place]  Phone: [phone], Fax: [phone].  Hours of Operation: 8:30 am - 5:30 pm, M-F. Also accepts Medicaid and self-pay.   [facility] [place]  [address], [place]  Phone: [phone]    [place], [place], [place]  [phone], Ext. [phone]  Mondays & Thursdays: 7-9 AM. First 15 patients are seen on a first come, first serve basis.   [place] Providers:  Organization Address Phone Notes  [facility]  [address], [place]  [phone]  Also accepts self-pay patients.   [place],  [place]  [phone]    [place], [address], [place]  [phone]    [place]  [address], [place]  [phone]    Lucianne Lei  [address], [place]  [phone]  Only accepts [place] patients after they have their name applied to their card.   Self-Pay (no insurance) in [facility]:  Organization Address Phone Notes  Sickle Cell Patients, [facility]  [place]  [phone]    [facility]  [place]  [phone]    [facility] [place]  [place], [place], [place]  [phone]    [facility]/Dr. Osei-Bonsu  [address] [place] or [address], [place]  [phone]  Phone number for both [place] and [place] locations is the same.   [facility] and [facility]  [address], [place]  [phone]    [place], [place] or [address]  [phone]  [phone]    [facility]  [address], [place]  [phone], phone; [phone], fax  Sees patients 1st and 3rd Saturday of every month. Must not qualify for public or private insurance (i.e. Medicaid, Medicare, [place] [employer], Veterans' Benefits)  Household income should be no more than 200% of the poverty level The clinic cannot treat you if you are pregnant or think you are pregnant  Sexually transmitted diseases are not treated at the clinic.

## 2013-12-01 NOTE — Progress Notes (Signed)
     Myoview is negative for ischemia. Normal LV function.  Will sign off.  She does not need to follow up with cardiology She should follow up with her medical doctor.   Thayer Headings, Brooke Bonito., MD, [facility] 12/01/2013, 11:43 AM Office - [phone] Pager [phone]

## 2013-12-01 NOTE — Progress Notes (Signed)
FMTS Attending Note  I personally saw and evaluated the patient. The plan of care was discussed with the resident team. I agree with the assessment and plan as documented by the resident.   Lexiscan Myoview negative for ischemia. Patient currently asymptomatic. Stable for discharge.   Dossie Arbour MD

## 2013-12-01 NOTE — Progress Notes (Signed)
Inpatient Diabetes Program Recommendations  AACE/ADA: New Consensus Statement on Inpatient Glycemic Control (2013)  Target Ranges:  Prepandial:   less than 140 mg/dL      Peak postprandial:   less than 180 mg/dL (1-2 hours)      Critically ill patients:  140 - 180 mg/dL     Results for Patricia [name], Patricia [name] (MRN [id number]) as of 12/01/2013 12:46  Ref. Range 11/29/2013 21:13  Hemoglobin A1C Latest Range: <5.7 % 6.4 (H)    Received referral for this patient.  Patient with pre-DM.  Needs follow up with PCP after d/c for further management of glucose levels.  Spoke with pt about her A1c.  Explained what an A1C is and the significance of this test.    Spoke with patient about her nutrition at home.  Patient drinks a lot of regular soda, sweet tea, and fruit juice.  Explained what foods have carbohydrates and how to measure portion sizes.  Encouraged patient to avoid juice, regular soda, and sweet tea.  Also encouraged patient to be mindful of her portion sizes and to limit consumption of desserts.  Explained how uncontrolled DM can affect the entire body and that hyperglycemia can lead to chronic conditions like kidney disease, eye disease, neuropathy, etc.  Encouraged patient to follow up with Family Medicine after d/c for further monitoring of her glucose levels.   Will follow. Wyn Quaker RN, MSN, CDE Diabetes Coordinator Inpatient Diabetes Program Team Pager: [phone] (8a-10p)

## 2013-12-06 NOTE — Discharge Summary (Signed)
I agree with the discharge summary as documented.   Shmiel [place] MD  

## 2013-12-19 ENCOUNTER — Encounter (HOSPITAL_COMMUNITY): Payer: Self-pay | Admitting: Emergency Medicine

## 2013-12-19 ENCOUNTER — Emergency Department (HOSPITAL_COMMUNITY): Payer: Self-pay

## 2013-12-19 ENCOUNTER — Emergency Department (HOSPITAL_COMMUNITY)
Admission: EM | Admit: 2013-12-19 | Discharge: 2013-12-19 | Disposition: A | Discharge status: Home / Self Care | Payer: Self-pay | Attending: Emergency Medicine | Admitting: Emergency Medicine

## 2013-12-19 DIAGNOSIS — J45909 Unspecified asthma, uncomplicated: Secondary | ICD-10-CM | POA: Insufficient documentation

## 2013-12-19 DIAGNOSIS — K219 Gastro-esophageal reflux disease without esophagitis: Secondary | ICD-10-CM | POA: Insufficient documentation

## 2013-12-19 DIAGNOSIS — Z79899 Other long term (current) drug therapy: Secondary | ICD-10-CM | POA: Insufficient documentation

## 2013-12-19 DIAGNOSIS — E7800 Pure hypercholesterolemia, unspecified: Secondary | ICD-10-CM | POA: Insufficient documentation

## 2013-12-19 DIAGNOSIS — E119 Type 2 diabetes mellitus without complications: Secondary | ICD-10-CM | POA: Insufficient documentation

## 2013-12-19 DIAGNOSIS — Z8701 Personal history of pneumonia (recurrent): Secondary | ICD-10-CM | POA: Insufficient documentation

## 2013-12-19 DIAGNOSIS — Z87891 Personal history of nicotine dependence: Secondary | ICD-10-CM | POA: Insufficient documentation

## 2013-12-19 DIAGNOSIS — F319 Bipolar disorder, unspecified: Secondary | ICD-10-CM | POA: Insufficient documentation

## 2013-12-19 DIAGNOSIS — F209 Schizophrenia, unspecified: Secondary | ICD-10-CM | POA: Insufficient documentation

## 2013-12-19 DIAGNOSIS — I10 Essential (primary) hypertension: Secondary | ICD-10-CM | POA: Insufficient documentation

## 2013-12-19 DIAGNOSIS — G43909 Migraine, unspecified, not intractable, without status migrainosus: Secondary | ICD-10-CM | POA: Insufficient documentation

## 2013-12-19 DIAGNOSIS — R079 Chest pain, unspecified: Secondary | ICD-10-CM

## 2013-12-19 DIAGNOSIS — F411 Generalized anxiety disorder: Secondary | ICD-10-CM | POA: Insufficient documentation

## 2013-12-19 HISTORY — DX: Type 2 diabetes mellitus without complications: E11.9

## 2013-12-19 LAB — TROPONIN I: Troponin I: <0.3 ng/mL (ref <0.30)

## 2013-12-19 MED ORDER — HYDROCODONE-ACETAMINOPHEN 5-325 MG PO TABS: 2.0000 | ORAL_TABLET | ORAL | Status: DC | PRN

## 2013-12-19 MED ORDER — GI COCKTAIL ~~LOC~~
30.0000 mL | Freq: Once | ORAL | Status: AC
  Administered 2013-12-19: 30 mL via ORAL
  Filled 2013-12-19: qty 30

## 2013-12-19 MED ORDER — ONDANSETRON HCL 4 MG/2ML IJ SOLN
4.0000 mg | Freq: Once | INTRAMUSCULAR | Status: AC
  Administered 2013-12-19: 4 mg via INTRAVENOUS
  Filled 2013-12-19: qty 2

## 2013-12-19 MED ORDER — HYDROMORPHONE HCL PF 1 MG/ML IJ SOLN
1.0000 mg | Freq: Once | INTRAMUSCULAR | Status: AC
  Administered 2013-12-19: 1 mg via INTRAVENOUS
  Filled 2013-12-19: qty 1

## 2013-12-19 MED ORDER — SUCRALFATE 1 G PO TABS: 1.0000 g | ORAL_TABLET | Freq: Four times a day (QID) | ORAL | Status: DC
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
## 2013-12-19 NOTE — ED Notes (Signed)
IV team paged.  

## 2013-12-19 NOTE — ED Notes (Signed)
Per EMS, sts 9 am this morning began having 10/10 chest pain non radiating. Took 2 nexuim without relief. sts some nausea. No SOB. Denies weakness/dizziness. 12 lead unremarkable. NSR. 4 ASA and 1 nitro. 130/90 bp. CBG 101. Similar episode 3 weeks ago and was here.

## 2013-12-19 NOTE — Discharge Instructions (Signed)
Chest Pain (Nonspecific) Chest pain has many causes. Your pain could be caused by something serious, such as a heart attack or a blood clot in the lungs. It could also be caused by something less serious, such as a chest bruise or a virus. Follow up with your doctor. More lab tests or other studies may be needed to find the cause of your pain. Most of the time, nonspecific chest pain will improve within 2 to 3 days of rest and mild pain medicine. HOME CARE  For chest bruises, you may put ice on the sore area for 15-20 minutes, 03-04 times a day. Do this only if it makes you feel better.  Put ice in a plastic bag.  Place a towel between the skin and the bag.  Rest for the next 2 to 3 days.  Go back to work if the pain improves.  See your doctor if the pain lasts longer than 1 to 2 weeks.  Only take medicine as told by your doctor.  Quit smoking if you smoke. GET HELP RIGHT AWAY IF:   There is more pain or pain that spreads to the arm, neck, jaw, back, or belly (abdomen).  You have shortness of breath.  You cough more than usual or cough up blood.  You have very bad back or belly pain, feel sick to your stomach (nauseous), or throw up (vomit).  You have very bad weakness.  You pass out (faint).  You have a fever. Any of these problems may be serious and may be an emergency. Do not wait to see if the problems will go away. Get medical help right away. Call your local emergency services 911 in [place] Do not drive yourself to the hospital. MAKE SURE YOU:   Understand these instructions.  Will watch this condition.  Will get help right away if you or your child is not doing well or gets worse. Document Released: 01/01/2008 Document Revised: 10/07/2011 Document Reviewed: 01/01/2008 [facility] Patient Information 2014 [place], [place].  Esophagitis Esophagitis is inflammation of the esophagus. It can involve swelling, soreness, and pain in the esophagus. This condition can make it  difficult and painful to swallow. CAUSES  Most causes of esophagitis are not serious. Many different factors can cause esophagitis, including:  Gastroesophageal reflux disease (GERD). This is when acid from your stomach flows up into the esophagus.  Recurrent vomiting.  An allergic-type reaction.  Certain medicines, especially those that come in large pills.  Ingestion of harmful chemicals, such as household cleaning products.  Heavy alcohol use.  An infection of the esophagus.  Radiation treatment for cancer.  Certain diseases such as sarcoidosis, Crohn's disease, and scleroderma. These diseases may cause recurrent esophagitis. SYMPTOMS   Trouble swallowing.  Painful swallowing.  Chest pain.  Difficulty breathing.  Nausea.  Vomiting.  Abdominal pain. DIAGNOSIS  Your caregiver will take your history and do a physical exam. Depending upon what your caregiver finds, certain tests may also be done, including:  Barium X-ray. You will drink a solution that coats the esophagus, and X-rays will be taken.  Endoscopy. A lighted tube is put down the esophagus so your caregiver can examine the area.  Allergy tests. These can sometimes be arranged through follow-up visits. TREATMENT  Treatment will depend on the cause of your esophagitis. In some cases, steroids or other medicines may be given to help relieve your symptoms or to treat the underlying cause of your condition. Medicines that may be recommended include:  Viscous lidocaine,  to soothe the esophagus.  Antacids.  Acid reducers.  Proton pump inhibitors.  Antiviral medicines for certain viral infections of the esophagus.  Antifungal medicines for certain fungal infections of the esophagus.  Antibiotic medicines, depending on the cause of the esophagitis. HOME CARE INSTRUCTIONS   Avoid foods and drinks that seem to make your symptoms worse.  Eat small, frequent meals instead of large meals.  Avoid eating  for the 3 hours prior to your bedtime.  If you have trouble taking pills, use a pill splitter to decrease the size and likelihood of the pill getting stuck or injuring the esophagus on the way down. Drinking water after taking a pill also helps.  Stop smoking if you smoke.  Maintain a healthy weight.  Wear loose-fitting clothing. Do not wear anything tight around your waist that causes pressure on your stomach.  Raise the head of your bed 6 to 8 inches with wood blocks to help you sleep. Extra pillows will not help.  Only take over-the-counter or prescription medicines as directed by your caregiver. SEEK IMMEDIATE MEDICAL CARE IF:  You have severe chest pain that radiates into your arm, neck, or jaw.  You feel sweaty, dizzy, or lightheaded.  You have shortness of breath.  You vomit blood.  You have difficulty or pain with swallowing.  You have bloody or black, tarry stools.  You have a fever.  You have a burning sensation in the chest more than 3 times a week for more than 2 weeks.  You cannot swallow, drink, or eat.  You drool because you cannot swallow your saliva. MAKE SURE YOU:  Understand these instructions.  Will watch your condition.  Will get help right away if you are not doing well or get worse. Document Released: 08/22/2004 Document Revised: 10/07/2011 Document Reviewed: 03/15/2011 [facility] Patient Information 2014 [place], [place].  Gastroesophageal Reflux Disease, Adult Gastroesophageal reflux disease (GERD) happens when acid from your stomach goes into your food pipe (esophagus). The acid can cause a burning feeling in your chest. Over time, the acid can make small holes (ulcers) in your food pipe.  HOME CARE  Ask your doctor for advice about:  Losing weight.  Quitting smoking.  Alcohol use.  Avoid foods and drinks that make your problems worse. You may want to avoid:  Caffeine and alcohol.  Chocolate.  Mints.  Garlic and onions.  Spicy  foods.  Citrus fruits, such as oranges, lemons, or limes.  Foods that contain tomato, such as sauce, chili, salsa, and pizza.  Fried and fatty foods.  Avoid lying down for 3 hours before you go to bed or before you take a nap.  Eat small meals often, instead of large meals.  Wear loose-fitting clothing. Do not wear anything tight around your waist.  Raise (elevate) the head of your bed 6 to 8 inches with wood blocks. Using extra pillows does not help.  Only take medicines as told by your doctor.  Do not take aspirin or ibuprofen. GET HELP RIGHT AWAY IF:   You have pain in your arms, neck, jaw, teeth, or back.  Your pain gets worse or changes.  You feel sick to your stomach (nauseous), throw up (vomit), or sweat (diaphoresis).  You feel short of breath, or you pass out (faint).  Your throw up is green, yellow, black, or looks like coffee grounds or blood.  Your poop (stool) is red, bloody, or black. MAKE SURE YOU:   Understand these instructions.  Will watch your condition.  Will get help right away if you are not doing well or get worse. Document Released: 01/01/2008 Document Revised: 10/07/2011 Document Reviewed: 02/01/2011 [facility] Patient Information 2014 [place], [place].

## 2014-02-10 ENCOUNTER — Emergency Department: Payer: Self-pay | Admitting: Emergency Medicine

## 2014-02-18 ENCOUNTER — Encounter: Payer: Self-pay | Admitting: Obstetrics & Gynecology

## 2014-02-25 ENCOUNTER — Encounter: Payer: Self-pay | Admitting: *Deleted

## 2014-03-15 ENCOUNTER — Encounter (HOSPITAL_COMMUNITY): Payer: Self-pay | Admitting: Emergency Medicine

## 2014-03-15 ENCOUNTER — Emergency Department (HOSPITAL_COMMUNITY)
Admission: EM | Admit: 2014-03-15 | Discharge: 2014-03-15 | Disposition: A | Discharge status: Home / Self Care | Payer: Self-pay | Attending: Emergency Medicine | Admitting: Emergency Medicine

## 2014-03-15 DIAGNOSIS — F411 Generalized anxiety disorder: Secondary | ICD-10-CM | POA: Insufficient documentation

## 2014-03-15 DIAGNOSIS — Z87891 Personal history of nicotine dependence: Secondary | ICD-10-CM | POA: Insufficient documentation

## 2014-03-15 DIAGNOSIS — F209 Schizophrenia, unspecified: Secondary | ICD-10-CM | POA: Insufficient documentation

## 2014-03-15 DIAGNOSIS — F319 Bipolar disorder, unspecified: Secondary | ICD-10-CM | POA: Insufficient documentation

## 2014-03-15 DIAGNOSIS — Z8669 Personal history of other diseases of the nervous system and sense organs: Secondary | ICD-10-CM | POA: Insufficient documentation

## 2014-03-15 DIAGNOSIS — I10 Essential (primary) hypertension: Secondary | ICD-10-CM | POA: Insufficient documentation

## 2014-03-15 DIAGNOSIS — R519 Headache, unspecified: Secondary | ICD-10-CM

## 2014-03-15 DIAGNOSIS — R51 Headache: Secondary | ICD-10-CM

## 2014-03-15 DIAGNOSIS — G43909 Migraine, unspecified, not intractable, without status migrainosus: Secondary | ICD-10-CM | POA: Insufficient documentation

## 2014-03-15 DIAGNOSIS — Z8701 Personal history of pneumonia (recurrent): Secondary | ICD-10-CM | POA: Insufficient documentation

## 2014-03-15 DIAGNOSIS — K219 Gastro-esophageal reflux disease without esophagitis: Secondary | ICD-10-CM | POA: Insufficient documentation

## 2014-03-15 DIAGNOSIS — J45909 Unspecified asthma, uncomplicated: Secondary | ICD-10-CM | POA: Insufficient documentation

## 2014-03-15 DIAGNOSIS — E7800 Pure hypercholesterolemia, unspecified: Secondary | ICD-10-CM | POA: Insufficient documentation

## 2014-03-15 DIAGNOSIS — E119 Type 2 diabetes mellitus without complications: Secondary | ICD-10-CM | POA: Insufficient documentation

## 2014-03-15 DIAGNOSIS — Z79899 Other long term (current) drug therapy: Secondary | ICD-10-CM | POA: Insufficient documentation

## 2014-03-15 MED ORDER — ONDANSETRON 8 MG PO TBDP: 8.0000 mg | ORAL_TABLET | Freq: Three times a day (TID) | ORAL | Status: DC | PRN

## 2014-03-15 MED ORDER — NAPROXEN 500 MG PO TABS: 500.0000 mg | ORAL_TABLET | Freq: Two times a day (BID) | ORAL | Status: DC

## 2014-03-15 MED ORDER — METOCLOPRAMIDE HCL 5 MG/ML IJ SOLN
10.0000 mg | Freq: Once | INTRAMUSCULAR | Status: AC
  Administered 2014-03-15: 10 mg via INTRAVENOUS
  Filled 2014-03-15: qty 2

## 2014-03-15 MED ORDER — SODIUM CHLORIDE 0.9 % IV BOLUS (SEPSIS)
1000.0000 mL | Freq: Once | INTRAVENOUS | Status: AC
  Administered 2014-03-15: 1000 mL via INTRAVENOUS

## 2014-03-15 MED ORDER — SUMATRIPTAN SUCCINATE 50 MG PO TABS
50.0000 mg | ORAL_TABLET | ORAL | Status: DC | PRN
Start: 2014-03-15 — End: 2015-03-29

## 2014-03-15 MED ORDER — KETOROLAC TROMETHAMINE 30 MG/ML IJ SOLN
30.0000 mg | Freq: Once | INTRAMUSCULAR | Status: AC
  Administered 2014-03-15: 30 mg via INTRAVENOUS
  Filled 2014-03-15: qty 1

## 2014-03-15 MED ORDER — MORPHINE SULFATE 4 MG/ML IJ SOLN
6.0000 mg | Freq: Once | INTRAMUSCULAR | Status: AC
  Administered 2014-03-15: 6 mg via INTRAVENOUS
  Filled 2014-03-15: qty 2

## 2014-03-15 MED ORDER — ONDANSETRON 8 MG PO TBDP
8.0000 mg | ORAL_TABLET | Freq: Once | ORAL | Status: AC
  Administered 2014-03-15: 8 mg via ORAL
  Filled 2014-03-15: qty 1

## 2014-03-15 MED ORDER — HYDROMORPHONE HCL PF 1 MG/ML IJ SOLN
1.0000 mg | Freq: Once | INTRAMUSCULAR | Status: AC
  Administered 2014-03-15: 1 mg via INTRAVENOUS
  Filled 2014-03-15: qty 1
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
## 2014-03-15 NOTE — Discharge Instructions (Signed)

## 2014-03-15 NOTE — ED Notes (Signed)
Patient reports a migraine x 3 weeks. Patient states she came to the ED and received a cocktail with no relief. Patient states she has ran out of her Imitrex and says she can not refill until September. Patient also c/o intermittent nausea, blurred vision, and sensitivity to light.

## 2014-03-20 ENCOUNTER — Emergency Department (HOSPITAL_COMMUNITY)
Admission: EM | Admit: 2014-03-20 | Discharge: 2014-03-20 | Disposition: A | Discharge status: Home / Self Care | Payer: Self-pay | Attending: Emergency Medicine | Admitting: Emergency Medicine

## 2014-03-20 ENCOUNTER — Encounter (HOSPITAL_COMMUNITY): Payer: Self-pay | Admitting: Emergency Medicine

## 2014-03-20 ENCOUNTER — Emergency Department (HOSPITAL_COMMUNITY): Payer: Self-pay

## 2014-03-20 DIAGNOSIS — G43909 Migraine, unspecified, not intractable, without status migrainosus: Secondary | ICD-10-CM | POA: Insufficient documentation

## 2014-03-20 DIAGNOSIS — F209 Schizophrenia, unspecified: Secondary | ICD-10-CM | POA: Insufficient documentation

## 2014-03-20 DIAGNOSIS — F411 Generalized anxiety disorder: Secondary | ICD-10-CM | POA: Insufficient documentation

## 2014-03-20 DIAGNOSIS — S279XXA Injury of unspecified intrathoracic organ, initial encounter: Secondary | ICD-10-CM | POA: Insufficient documentation

## 2014-03-20 DIAGNOSIS — Z8701 Personal history of pneumonia (recurrent): Secondary | ICD-10-CM | POA: Insufficient documentation

## 2014-03-20 DIAGNOSIS — Z87891 Personal history of nicotine dependence: Secondary | ICD-10-CM | POA: Insufficient documentation

## 2014-03-20 DIAGNOSIS — I10 Essential (primary) hypertension: Secondary | ICD-10-CM | POA: Insufficient documentation

## 2014-03-20 DIAGNOSIS — IMO0002 Reserved for concepts with insufficient information to code with codable children: Secondary | ICD-10-CM | POA: Insufficient documentation

## 2014-03-20 DIAGNOSIS — F319 Bipolar disorder, unspecified: Secondary | ICD-10-CM | POA: Insufficient documentation

## 2014-03-20 DIAGNOSIS — K219 Gastro-esophageal reflux disease without esophagitis: Secondary | ICD-10-CM | POA: Insufficient documentation

## 2014-03-20 DIAGNOSIS — E119 Type 2 diabetes mellitus without complications: Secondary | ICD-10-CM | POA: Insufficient documentation

## 2014-03-20 DIAGNOSIS — E7800 Pure hypercholesterolemia, unspecified: Secondary | ICD-10-CM | POA: Insufficient documentation

## 2014-03-20 DIAGNOSIS — Z79899 Other long term (current) drug therapy: Secondary | ICD-10-CM | POA: Insufficient documentation

## 2014-03-20 DIAGNOSIS — Z791 Long term (current) use of non-steroidal anti-inflammatories (NSAID): Secondary | ICD-10-CM | POA: Insufficient documentation

## 2014-03-20 DIAGNOSIS — J45909 Unspecified asthma, uncomplicated: Secondary | ICD-10-CM | POA: Insufficient documentation

## 2014-03-20 DIAGNOSIS — M546 Pain in thoracic spine: Secondary | ICD-10-CM

## 2014-03-20 MED ORDER — OXYCODONE-ACETAMINOPHEN 5-325 MG PO TABS
2.0000 | ORAL_TABLET | Freq: Once | ORAL | Status: AC
  Administered 2014-03-20: 2 via ORAL
  Filled 2014-03-20: qty 2

## 2014-03-20 MED ORDER — MELOXICAM 15 MG PO TABS: 15.0000 mg | ORAL_TABLET | Freq: Every day | ORAL | Status: DC

## 2014-03-20 NOTE — ED Provider Notes (Signed)
Medical screening examination/treatment/procedure(s) were performed by non-physician practitioner and as supervising physician I was immediately available for consultation/collaboration.   EKG Interpretation None        Hoy Morn, MD 03/20/14 [phone]

## 2014-03-20 NOTE — Discharge Instructions (Signed)
Take mobic for pain as needed. Refer to attached documents for more information. Use the resource guide below to find a shelter.    Emergency Department Resource Guide 1) Find a Doctor and Pay Out of Pocket Although you won't have to find out who is covered by your insurance plan, it is a good idea to ask around and get recommendations. You will then need to call the office and see if the doctor you have chosen will accept you as a new patient and what types of options they offer for patients who are self-pay. Some doctors offer discounts or will set up payment plans for their patients who do not have insurance, but you will need to ask so you aren't surprised when you get to your appointment.  2) Contact Your Local Health Department Not all health departments have doctors that can see patients for sick visits, but many do, so it is worth a call to see if yours does. If you don't know where your local health department is, you can check in your phone book. The CDC also has a tool to help you locate your state's health department, and many state websites also have listings of all of their local health departments.  3) Find a [place] [facility] If your illness is not likely to be very severe or complicated, you may want to try a walk in clinic. These are popping up all over the country in pharmacies, drugstores, and shopping centers. They're usually staffed by nurse practitioners or physician assistants that have been trained to treat common illnesses and complaints. They're usually fairly quick and inexpensive. However, if you have serious medical issues or chronic medical problems, these are probably not your best option.  No Primary Care Doctor: - Call Health Connect at  [phone] - they can help you locate a primary care doctor that  accepts your insurance, provides certain services, etc. - Physician Referral Service- [phone]  Chronic Pain Problems: Organization         Address  Phone    Notes  [facility]  [phone] Patients need to be referred by their primary care doctor.   Medication Assistance: Organization         Address  Phone   Notes  [facility] Med[facility] [address] [place], [place] [address] [phone] --Must be a resident of [facility] -- Must have NO insurance coverage whatsoever (no Medicaid/ Medicare, etc.) -- The pt. MUST have a primary care doctor that directs their care regularly and follows them in the community   MedAssist  [phone]   [employer]  [phone]    Agencies that provide inexpensive medical care: Organization         Address  Phone   Notes  [place]  [phone]   [facility]    [phone]   [facility] [place], [place] [address] [phone]   [place] [address], [place] [phone]   Planned Parenthood    [phone]   [facility]    [phone]   [place] and [address], [place] Phone:  [phone], Fax:  [phone] Hours of Operation:  9 am - 6 pm, M-F.  Also accepts Medicaid/Medicare and self-pay.  [facility] for [place] [address], [employer] Phone: [phone])  [phone], Fax: [phone]. Hours of Operation:  8:30 am - 5:30 pm, M-F.  Also accepts Medicaid and self-pay.  [facility] [place] [address] [place] Phone: [phone]   [place], [place], [place] [phone], Ext. [phone] Mondays & Thursdays: 7-9 AM.  First 15 patients are seen on a first come, first serve basis.    [place] Providers:  Organization         Address  Phone   Notes  [facility] [address], [place] [phone] Also accepts self-pay patients.  [facility]  [id number] [place], [place]  [phone]   [place], [address], [place] [phone]   [facility] [address] [place] [phone]   Lucianne Lei [address], [place]   [phone] Only accepts [place] Access [place] patients after they have their name applied to their card.   Self-Pay (no insurance) in [facility]:  Organization         Address  Phone   Notes  Sickle Cell Patients, [facility] [place] [phone]   [facility] Urgent Care [place] [phone]   [facility] [place]  [place], [place], [place] [phone]   [facility]/Dr. Osei-Bonsu  [address], [place] or [address], [place] [phone] Phone number for both Dent and [place] locations is the same.  [facility] and [facility] [address], [place] [phone]   [facility] [address] [place] or [address] [phone]   [facility] [address] [place] [phone], phone; [phone], fax Sees patients 1st and 3rd Saturday of every month.  Must not qualify for public or private insurance (i.e. Medicaid, Medicare, [place] Health Choice, Veterans' Benefits)  Household income should be no more than 200% of the poverty level The clinic cannot treat you if you are pregnant or think you are pregnant  Sexually transmitted diseases are not treated at the clinic.    Dental Care: Organization         Address  Phone  Notes  [facility] Department of [facility] [place] [phone] Accepts children up to age 49 who are enrolled in [place] or [place]; pregnant women with a Medicaid card; and children who have  applied for Medicaid or [place] Health Choice, but were declined, whose parents can pay a reduced fee at time of service.  [facility] Department of [facility]  [address], [place] [phone] Accepts children up to age 43 who are enrolled in [place] or [place]; pregnant women with a Medicaid card; and children who have applied for Medicaid or [place] Health Choice, but were declined, whose parents can pay a reduced fee at time of service.  [place] [facility] PRO[facility]  [place] [phone] Patients are seen by appointment only. Walk-ins are not accepted. [place] will see patients 75 years of age and older. Monday - Tuesday (8am-5pm) Most Wednesdays (8:30-5pm) $30 per visit, cash only  [place] [facility]  [address], [employer]  [place] [phone] Patients are seen by appointment only. Walk-ins are not accepted. [place] will see patients 52 years of age and older. One Wednesday Evening (Monthly: Volunteer Based).  $30 per visit, cash only  [place]  [phone] for adults; Children under age 58, call Graduate Pediatric Dentistry at [phone]. Children aged 77-14, please call [phone] to request a pediatric application.  Dental services are provided in all areas of dental care including fillings, crowns and bridges, complete and partial dentures, implants, gum treatment, root canals, and extractions. Preventive care is also provided. Treatment is provided to both adults and children. Patients are selected via a lottery and there is often a waiting list.   [facility] [address], [place]  [phone] [email or web address]   [facility] [address] [place], [place] [phone], Ext. [phone] Second and Fourth Thursday of each month, opens at 6:30 AM; Clinic ends at 9 AM.  Patients are seen on a first-come first-served basis, and a  limited number are seen during each clinic.   [facility]  [address] Hillard Danker [place], [place] [phone]   Eligibility Requirements You must have lived in [place], [place], or [place] counties for at least the last three months.   You cannot be eligible for state or federal sponsored [employer], including [employer], [place], or [employer].   You generally cannot be eligible for healthcare insurance through your employer.    How to apply: Eligibility screenings are held every Tuesday and Wednesday afternoon from 1:00 pm until 4:00 pm. You do not need an appointment for the interview!  [facility] [address], [place], [place]   [place]  [place] Department  [place]  [phone]    Behavioral Health Resources in the Community: Intensive Outpatient Programs Organization         Address  Phone  Notes  [place]. [address], [place], [place] [phone]   [facility] [address] [place], [place]   ADS: [facility] Drug S[facility] [address] [place], [place]   [place] [address]  [place], [place] or [phone]   Substance Abuse Resources Organization         Address  Phone  Notes  Alcohol and Drug Services  [phone]   [place]  [phone]   [facility] [place]   Chinita Pester  [phone]   Residential & Outpatient Substance Abuse Program  [phone]   Psychological Services Organization         Address  Phone  Notes  [facility] [place]  [place]  [phone]   [place] [address] [place] or [phone]    Mobile Crisis Teams Organization          Address  Phone  Notes  [facility], Mobile Crisis Care Unit  [phone]   [facility]  [address] [place], [place]   [place] [address] [place] [phone]    Self-Help/Support Groups Organization         Address  Phone             Notes  [place]. of [place] - variety of support groups  336- H3156881 Call for more information  Narcotics Anonymous (NA), [facility] [address], [employer] [place]  2 meetings at this location   Residential Facilities manager         Address  Phone  Notes  [facility] [place],    [place]  [phone]   [facility]  [address], [place] [id number], [place], [place]   [place], [place] [phone] Admissions: 8am-3pm M-F  Incentives Substance [place] [address]    [employer], [place] [phone]   [facility] [address] [place], [place], [place]   [facility] [address],  [place], [place]   [facility] - Intensive Outpatient [address] Kristeen [address], [place], [place]   [facility] ([place].,  [place], [place] [phone] or [phone]   [facility] ([facility]) [address], [place], [place] Accepts Medicaid  [facility] [address]  [place] [phone] Substance Abuse/Addiction Treatment   [facility] Organization         Address  Phone  Notes  [facility]  [phone]   Domenic Schwab, PhD [address] Arlis Porta [place], [place]   [phone] or [phone]   [place], [place] [phone]   [facility] [address], [place], [place] [phone] Insurance/Medicaid/sponsorship  through [facility] Families [address]                                    [place], [place] [phone] [place] [address], [place] [phone]    Dr. Adele Schilder  [phone]   [facility] [place] Dept. 1) [address] [place] 2) [place] 3)  [place] [address], [place] [phone]  [phone]   [place] [phone] or [phone] (After Hours)
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
## 2014-03-20 NOTE — Progress Notes (Signed)
Clinical Social Work  CSW spoke with [place] [facility] shelter who reports they have completed phone assessment and they can accept patient for 5 days. Patient aware of 5 day stay and is agreeable to this plan. Patient has no other means of transportation. [facility] bus does not run on the weekends. CSW spoke with ED [employer] who reports that since shelter is outside of county lines then they cannot provide transportation. CSW used taxi voucher and informed patient of plans. Patient thanked CSW for assistance. RN aware of DC plans.  CSW is signing off but available if further needs arise.  Sindy Messing, LCSW  (Weekend Coverage)

## 2014-03-20 NOTE — ED Notes (Addendum)
Pt. Is for discharge, referred to Social Work. Awaiting to be seen by Social work after 8am today.
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
## 2014-03-28 ENCOUNTER — Ambulatory Visit (INDEPENDENT_AMBULATORY_CARE_PROVIDER_SITE_OTHER): Payer: Self-pay | Admitting: Obstetrics & Gynecology

## 2014-03-28 ENCOUNTER — Encounter: Payer: Self-pay | Admitting: Obstetrics & Gynecology

## 2014-03-28 ENCOUNTER — Other Ambulatory Visit (HOSPITAL_COMMUNITY)
Admission: RE | Admit: 2014-03-28 | Discharge: 2014-03-28 | Disposition: A | Discharge status: Home / Self Care | Payer: Self-pay | Source: Ambulatory Visit | Attending: Obstetrics & Gynecology | Admitting: Obstetrics & Gynecology

## 2014-03-28 VITALS — BP 130/82 | HR 72 | Temp 98.1°F | Ht 64.5 in | Wt 252.2 lb

## 2014-03-28 DIAGNOSIS — Z0000 Encounter for general adult medical examination without abnormal findings: Secondary | ICD-10-CM

## 2014-03-28 DIAGNOSIS — N926 Irregular menstruation, unspecified: Secondary | ICD-10-CM | POA: Insufficient documentation

## 2014-03-28 DIAGNOSIS — N938 Other specified abnormal uterine and vaginal bleeding: Secondary | ICD-10-CM

## 2014-03-28 DIAGNOSIS — R32 Unspecified urinary incontinence: Secondary | ICD-10-CM

## 2014-03-28 DIAGNOSIS — N939 Abnormal uterine and vaginal bleeding, unspecified: Principal | ICD-10-CM | POA: Insufficient documentation

## 2014-03-28 DIAGNOSIS — N949 Unspecified condition associated with female genital organs and menstrual cycle: Secondary | ICD-10-CM

## 2014-03-28 LAB — CBC
HCT: 34.2 % — ABNORMAL LOW (ref 36.0–46.0)
HEMOGLOBIN: 10.7 g/dL — AB (ref 12.0–15.0)
MCH: 22.7 pg — AB (ref 26.0–34.0)
MCHC: 31.3 g/dL (ref 30.0–36.0)
MCV: 72.6 fL — ABNORMAL LOW (ref 78.0–100.0)
Platelets: 422 10*3/uL — ABNORMAL HIGH (ref 150–400)
RBC: 4.71 MIL/uL (ref 3.87–5.11)
RDW: 17.5 % — ABNORMAL HIGH (ref 11.5–15.5)
WBC: 8.6 10*3/uL (ref 4.0–10.5)

## 2014-03-28 LAB — POCT PREGNANCY, URINE: PREG TEST UR: NEGATIVE

## 2014-03-28 NOTE — Progress Notes (Signed)
Referral to [facility] [phone] [address] [place], [place]). Patient informed of $150 upfront fee and then payment plan based on services provided. Patient states she would like appointment made, however, may cancel if she cannot afford it. States she is waiting on Medicaid. Informed patient that if she chooses to wait until she obtains medicaid she should schedule appointment with PCP as medicaid will require referral to be made from PCP. Patient verbalized understanding and gratitude. Records faxed to [facility]-- [phone]. Mammogram scholarship filled out and faxed to radiology. Patient informed she will be called with mammogram appointment.

## 2014-03-28 NOTE — Progress Notes (Signed)
   Subjective:    Patient ID: Patricia [name], female    DOB: 25-Jun-1971, 43 y.o.   MRN: [id number]  HPI  82 yo S AA P5 here today with a 5 year h/o heavy periods. She says that she has to wear a pad and a tampon at the same time. I could not find a hemoglobin on her chart.  She also complains of urinary incontinence. This occurs at times when she doesn't even feel it come out, like with walking. She also describes GSUI.   Review of Systems No mammogram ever done Pap due    Objective:   Physical Exam  Morbidly obese AA Female, NAD EG- normal Vagina- normal Cervix- parous, no lesions Uterus- NSSA, NT, mobile Adnexa- no palpable masses   UPT negative, consent signed, time out done Cervix prepped with betadine and grasped with a single tooth tenaculum Uterus sounded to 9 cm Pipelle used for 2 passes with a moderate amount of tissue obtained. She tolerated the procedure well.        Assessment & Plan:  DUB- await EMB results, check CBC Preventative care- pap and mammogram Urinary incontinence- urology referral

## 2014-03-29 LAB — CYTOLOGY - PAP

## 2014-03-30 ENCOUNTER — Encounter: Payer: Self-pay | Admitting: General Practice

## 2014-05-04 ENCOUNTER — Ambulatory Visit (INDEPENDENT_AMBULATORY_CARE_PROVIDER_SITE_OTHER): Payer: Self-pay | Admitting: Obstetrics & Gynecology

## 2014-05-04 ENCOUNTER — Encounter: Payer: Self-pay | Admitting: Obstetrics & Gynecology

## 2014-05-04 VITALS — BP 126/83 | HR 86 | Ht 64.0 in | Wt 257.1 lb

## 2014-05-04 DIAGNOSIS — Z0000 Encounter for general adult medical examination without abnormal findings: Secondary | ICD-10-CM

## 2014-05-04 DIAGNOSIS — N920 Excessive and frequent menstruation with regular cycle: Secondary | ICD-10-CM

## 2014-05-04 DIAGNOSIS — R232 Flushing: Secondary | ICD-10-CM

## 2014-05-04 DIAGNOSIS — Z23 Encounter for immunization: Secondary | ICD-10-CM | POA: Insufficient documentation

## 2014-05-04 NOTE — Progress Notes (Signed)
   Subjective:    Patient ID: Patricia [name], female    DOB: 03-10-1971, 43 y.o.   MRN: [id number]  HPI 43 yo separated (for 20+ years) lady is here today to discuss her results. She was seen her last month with the complaint of painful heavy periods. She also has pain at other times during the month and occasional dyspareunia. Her u/s showed a small fibroid, her HBG was 10, TSH normal as well as a EMBX.  She would like a hysterectomy.   Review of Systems She already has hot flashes, mood swings, and occasional vaginal dryness. She reports that her mother went through menopause at age 72. She thinks that in 50 she had a tubal pregnancy and they removed one of her tubes.    Objective:   Physical Exam        Assessment & Plan:  ?menopause- check FSH. If in menopause, then I will remove her ovaries at the time of hysterectomy. I will schedule her for a LAVH. Flu vaccine today

## 2014-05-05 LAB — FOLLICLE STIMULATING HORMONE: FSH: 6.1 m[IU]/mL

## 2014-05-09 ENCOUNTER — Ambulatory Visit (HOSPITAL_COMMUNITY): Payer: Self-pay | Attending: Obstetrics & Gynecology

## 2014-05-30 ENCOUNTER — Encounter: Payer: Self-pay | Admitting: Obstetrics & Gynecology

## 2014-06-24 ENCOUNTER — Emergency Department: Payer: Self-pay | Admitting: Student

## 2014-07-02 ENCOUNTER — Emergency Department: Payer: Self-pay | Admitting: Emergency Medicine

## 2014-07-02 LAB — CBC
HCT: 30.3 % — ABNORMAL LOW (ref 35.0–47.0)
HGB: 9.4 g/dL — ABNORMAL LOW (ref 12.0–16.0)
MCH: 22.2 pg — ABNORMAL LOW (ref 26.0–34.0)
MCHC: 31.1 g/dL — ABNORMAL LOW (ref 32.0–36.0)
MCV: 71 fL — AB (ref 80–100)
Platelet: 449 10*3/uL — ABNORMAL HIGH (ref 150–440)
RBC: 4.24 10*6/uL (ref 3.80–5.20)
RDW: 18.8 % — ABNORMAL HIGH (ref 11.5–14.5)
WBC: 9.4 10*3/uL (ref 3.6–11.0)

## 2014-07-02 LAB — COMPREHENSIVE METABOLIC PANEL
ALK PHOS: 61 U/L
ALT: 23 U/L
AST: 19 U/L (ref 15–37)
Albumin: 3.4 g/dL (ref 3.4–5.0)
Anion Gap: 8 (ref 7–16)
BUN: 7 mg/dL (ref 7–18)
Bilirubin,Total: 0.1 mg/dL — ABNORMAL LOW (ref 0.2–1.0)
CALCIUM: 8.5 mg/dL (ref 8.5–10.1)
CHLORIDE: 105 mmol/L (ref 98–107)
CO2: 26 mmol/L (ref 21–32)
Creatinine: 0.98 mg/dL (ref 0.60–1.30)
EGFR (Non-African Amer.): >60
GLUCOSE: 95 mg/dL (ref 65–99)
OSMOLALITY: 275 (ref 275–301)
Potassium: 3.9 mmol/L (ref 3.5–5.1)
SODIUM: 139 mmol/L (ref 136–145)
TOTAL PROTEIN: 7.5 g/dL (ref 6.4–8.2)

## 2014-07-02 LAB — CK TOTAL AND CKMB (NOT AT ARMC)
CK, TOTAL: 228 U/L — AB (ref 26–192)
CK-MB: 1.5 ng/mL (ref 0.5–3.6)

## 2014-07-02 LAB — TROPONIN I: Troponin-I: <0.02 ng/mL

## 2014-07-11 NOTE — Patient Instructions (Addendum)
   Your procedure is scheduled on: Tuesday, Dec 22  Enter through the [employer] [facility] at: 8 AM Pick up the phone at the desk and dial [phone] and inform us of your arrival.  Please call this number if you have any problems the morning of surgery: [phone]  Remember: Do not eat or drink after midnight: Monday Take these medicines the morning of surgery with a SIP OF WATER: atenolol.  Use Qvar inahler. Bring albuterol inhaler with you on day of surgery.  Do not wear jewelry, make-up, or FINGER nail polish No metal in your hair or on your body. Do not wear lotions, powders, perfumes.  You may wear deodorant.  Do not bring valuables to the hospital. Contacts, dentures or bridgework may not be worn into surgery.  Leave suitcase in the car. After Surgery it may be brought to your room. For patients being admitted to the hospital, checkout time is 11:00am the day of discharge.  Home with sister [facility] cell [phone]

## 2014-07-12 ENCOUNTER — Other Ambulatory Visit: Payer: Self-pay

## 2014-07-12 ENCOUNTER — Encounter (HOSPITAL_COMMUNITY)
Admission: RE | Admit: 2014-07-12 | Discharge: 2014-07-12 | Disposition: A | Discharge status: Home / Self Care | Payer: Self-pay | Source: Ambulatory Visit | Attending: Obstetrics & Gynecology | Admitting: Obstetrics & Gynecology

## 2014-07-12 ENCOUNTER — Encounter (HOSPITAL_COMMUNITY): Payer: Self-pay

## 2014-07-12 DIAGNOSIS — Z01812 Encounter for preprocedural laboratory examination: Secondary | ICD-10-CM | POA: Insufficient documentation

## 2014-07-12 HISTORY — DX: Anemia, unspecified: D64.9

## 2014-07-12 LAB — BASIC METABOLIC PANEL
ANION GAP: 9 (ref 5–15)
BUN: 7 mg/dL (ref 6–23)
CHLORIDE: 104 meq/L (ref 96–112)
CO2: 26 mEq/L (ref 19–32)
Calcium: 8.9 mg/dL (ref 8.4–10.5)
Creatinine, Ser: 0.92 mg/dL (ref 0.50–1.10)
GFR calc non Af Amer: 75 mL/min — ABNORMAL LOW (ref >90)
GFR, EST AFRICAN AMERICAN: 87 mL/min — AB (ref >90)
Glucose, Bld: 98 mg/dL (ref 70–99)
Potassium: 3.6 mEq/L — ABNORMAL LOW (ref 3.7–5.3)
Sodium: 139 mEq/L (ref 137–147)

## 2014-07-12 LAB — CBC
HCT: 33 % — ABNORMAL LOW (ref 36.0–46.0)
HEMOGLOBIN: 10 g/dL — AB (ref 12.0–15.0)
MCH: 21.7 pg — ABNORMAL LOW (ref 26.0–34.0)
MCHC: 30.3 g/dL (ref 30.0–36.0)
MCV: 71.7 fL — ABNORMAL LOW (ref 78.0–100.0)
Platelets: 494 10*3/uL — ABNORMAL HIGH (ref 150–400)
RBC: 4.6 MIL/uL (ref 3.87–5.11)
RDW: 17.7 % — ABNORMAL HIGH (ref 11.5–15.5)
WBC: 10.8 10*3/uL — ABNORMAL HIGH (ref 4.0–10.5)

## 2014-07-12 NOTE — Pre-Procedure Instructions (Signed)
SDS BB History Log given to lab for 1990 previous blood transfusion.

## 2014-07-19 ENCOUNTER — Encounter (HOSPITAL_COMMUNITY)
Admission: RE | Disposition: A | Discharge status: Home / Self Care | Payer: Self-pay | Source: Ambulatory Visit | Attending: Obstetrics & Gynecology

## 2014-07-19 ENCOUNTER — Ambulatory Visit (HOSPITAL_COMMUNITY): Payer: MEDICAID | Admitting: Certified Registered Nurse Anesthetist

## 2014-07-19 ENCOUNTER — Ambulatory Visit (HOSPITAL_COMMUNITY): Payer: Self-pay | Admitting: Certified Registered Nurse Anesthetist

## 2014-07-19 ENCOUNTER — Encounter (HOSPITAL_COMMUNITY): Payer: Self-pay | Admitting: Anesthesiology

## 2014-07-19 ENCOUNTER — Inpatient Hospital Stay (HOSPITAL_COMMUNITY)
Admission: RE | Admit: 2014-07-19 | Discharge: 2014-07-29 | DRG: 742 | Disposition: A | Discharge status: Home / Self Care | Payer: Self-pay | Source: Ambulatory Visit | Attending: Obstetrics & Gynecology | Admitting: Obstetrics & Gynecology

## 2014-07-19 ENCOUNTER — Other Ambulatory Visit (INDEPENDENT_AMBULATORY_CARE_PROVIDER_SITE_OTHER): Payer: Self-pay | Admitting: Surgery

## 2014-07-19 DIAGNOSIS — K567 Ileus, unspecified: Secondary | ICD-10-CM | POA: Diagnosis present

## 2014-07-19 DIAGNOSIS — D259 Leiomyoma of uterus, unspecified: Secondary | ICD-10-CM

## 2014-07-19 DIAGNOSIS — Z6841 Body Mass Index (BMI) 40.0 and over, adult: Secondary | ICD-10-CM

## 2014-07-19 DIAGNOSIS — D250 Submucous leiomyoma of uterus: Secondary | ICD-10-CM

## 2014-07-19 DIAGNOSIS — N840 Polyp of corpus uteri: Secondary | ICD-10-CM | POA: Diagnosis present

## 2014-07-19 DIAGNOSIS — J42 Unspecified chronic bronchitis: Secondary | ICD-10-CM | POA: Diagnosis present

## 2014-07-19 DIAGNOSIS — D252 Subserosal leiomyoma of uterus: Principal | ICD-10-CM | POA: Diagnosis present

## 2014-07-19 DIAGNOSIS — G43909 Migraine, unspecified, not intractable, without status migrainosus: Secondary | ICD-10-CM | POA: Diagnosis present

## 2014-07-19 DIAGNOSIS — I10 Essential (primary) hypertension: Secondary | ICD-10-CM | POA: Diagnosis present

## 2014-07-19 DIAGNOSIS — F1290 Cannabis use, unspecified, uncomplicated: Secondary | ICD-10-CM | POA: Diagnosis present

## 2014-07-19 DIAGNOSIS — K913 Postprocedural intestinal obstruction: Secondary | ICD-10-CM | POA: Diagnosis not present

## 2014-07-19 DIAGNOSIS — D649 Anemia, unspecified: Secondary | ICD-10-CM | POA: Diagnosis present

## 2014-07-19 DIAGNOSIS — R109 Unspecified abdominal pain: Secondary | ICD-10-CM

## 2014-07-19 DIAGNOSIS — J45909 Unspecified asthma, uncomplicated: Secondary | ICD-10-CM | POA: Diagnosis present

## 2014-07-19 DIAGNOSIS — B999 Unspecified infectious disease: Secondary | ICD-10-CM

## 2014-07-19 DIAGNOSIS — F419 Anxiety disorder, unspecified: Secondary | ICD-10-CM | POA: Diagnosis present

## 2014-07-19 DIAGNOSIS — N920 Excessive and frequent menstruation with regular cycle: Secondary | ICD-10-CM | POA: Diagnosis present

## 2014-07-19 DIAGNOSIS — Z87891 Personal history of nicotine dependence: Secondary | ICD-10-CM

## 2014-07-19 DIAGNOSIS — R079 Chest pain, unspecified: Secondary | ICD-10-CM | POA: Diagnosis not present

## 2014-07-19 DIAGNOSIS — Z91018 Allergy to other foods: Secondary | ICD-10-CM

## 2014-07-19 DIAGNOSIS — K9189 Other postprocedural complications and disorders of digestive system: Secondary | ICD-10-CM

## 2014-07-19 DIAGNOSIS — E119 Type 2 diabetes mellitus without complications: Secondary | ICD-10-CM | POA: Diagnosis present

## 2014-07-19 DIAGNOSIS — F209 Schizophrenia, unspecified: Secondary | ICD-10-CM | POA: Diagnosis present

## 2014-07-19 DIAGNOSIS — E785 Hyperlipidemia, unspecified: Secondary | ICD-10-CM | POA: Diagnosis present

## 2014-07-19 DIAGNOSIS — L299 Pruritus, unspecified: Secondary | ICD-10-CM | POA: Diagnosis present

## 2014-07-19 DIAGNOSIS — G4733 Obstructive sleep apnea (adult) (pediatric): Secondary | ICD-10-CM | POA: Diagnosis present

## 2014-07-19 DIAGNOSIS — J189 Pneumonia, unspecified organism: Secondary | ICD-10-CM

## 2014-07-19 DIAGNOSIS — E780 Pure hypercholesterolemia: Secondary | ICD-10-CM | POA: Diagnosis present

## 2014-07-19 DIAGNOSIS — F319 Bipolar disorder, unspecified: Secondary | ICD-10-CM | POA: Diagnosis present

## 2014-07-19 DIAGNOSIS — N941 Unspecified dyspareunia: Secondary | ICD-10-CM

## 2014-07-19 DIAGNOSIS — K219 Gastro-esophageal reflux disease without esophagitis: Secondary | ICD-10-CM | POA: Diagnosis present

## 2014-07-19 DIAGNOSIS — Z9889 Other specified postprocedural states: Secondary | ICD-10-CM

## 2014-07-19 DIAGNOSIS — N946 Dysmenorrhea, unspecified: Secondary | ICD-10-CM

## 2014-07-19 DIAGNOSIS — Z9049 Acquired absence of other specified parts of digestive tract: Secondary | ICD-10-CM

## 2014-07-19 DIAGNOSIS — G8918 Other acute postprocedural pain: Secondary | ICD-10-CM

## 2014-07-19 HISTORY — PX: ABDOMINAL HYSTERECTOMY: SHX81

## 2014-07-19 HISTORY — PX: BOWEL RESECTION: SHX1257

## 2014-07-19 HISTORY — PX: LAPAROSCOPIC ASSISTED VAGINAL HYSTERECTOMY: SHX5398

## 2014-07-19 LAB — CBC
HEMATOCRIT: 27.8 % — AB (ref 36.0–46.0)
HEMOGLOBIN: 9 g/dL — AB (ref 12.0–15.0)
MCH: 25 pg — AB (ref 26.0–34.0)
MCHC: 32.4 g/dL (ref 30.0–36.0)
MCV: 77.2 fL — AB (ref 78.0–100.0)
Platelets: 260 10*3/uL (ref 150–400)
RBC: 3.6 MIL/uL — ABNORMAL LOW (ref 3.87–5.11)
RDW: 19 % — ABNORMAL HIGH (ref 11.5–15.5)
WBC: 10.9 10*3/uL — ABNORMAL HIGH (ref 4.0–10.5)

## 2014-07-19 LAB — CK TOTAL AND CKMB (NOT AT ARMC)
CK, MB: 2 ng/mL (ref 0.3–4.0)
Relative Index: 0.7 (ref 0.0–2.5)
Total CK: 286 U/L — ABNORMAL HIGH (ref 7–177)

## 2014-07-19 LAB — ABO/RH: ABO/RH(D): O POS

## 2014-07-19 LAB — PREGNANCY, URINE: PREG TEST UR: NEGATIVE

## 2014-07-19 LAB — TROPONIN I: Troponin I: <0.03 ng/mL (ref <0.031)

## 2014-07-19 LAB — PREPARE RBC (CROSSMATCH)

## 2014-07-19 SURGERY — HYSTERECTOMY, VAGINAL, LAPAROSCOPY-ASSISTED
Anesthesia: General | Site: Vagina

## 2014-07-19 MED ORDER — PROPOFOL 10 MG/ML IV EMUL
INTRAVENOUS | Status: AC
  Filled 2014-07-19: qty 20

## 2014-07-19 MED ORDER — FENTANYL CITRATE 0.05 MG/ML IJ SOLN
INTRAMUSCULAR | Status: AC
  Filled 2014-07-19: qty 5

## 2014-07-19 MED ORDER — KETOROLAC TROMETHAMINE 30 MG/ML IJ SOLN
INTRAMUSCULAR | Status: AC
  Filled 2014-07-19: qty 1

## 2014-07-19 MED ORDER — HYDROMORPHONE HCL 1 MG/ML IJ SOLN
INTRAMUSCULAR | Status: AC
  Administered 2014-07-19: 0.5 mg via INTRAVENOUS
  Filled 2014-07-19: qty 1

## 2014-07-19 MED ORDER — FENTANYL CITRATE 0.05 MG/ML IJ SOLN
INTRAMUSCULAR | Status: DC | PRN
  Administered 2014-07-19: 100 ug via INTRAVENOUS
  Administered 2014-07-19: 150 ug via INTRAVENOUS
  Administered 2014-07-19 (×5): 50 ug via INTRAVENOUS

## 2014-07-19 MED ORDER — DEXAMETHASONE SODIUM PHOSPHATE 10 MG/ML IJ SOLN
INTRAMUSCULAR | Status: DC | PRN
Start: 2014-07-19 — End: 2014-07-19
  Administered 2014-07-19: 4 mg via INTRAVENOUS

## 2014-07-19 MED ORDER — LACTATED RINGERS IR SOLN
Status: DC | PRN
  Administered 2014-07-19: 3000 mL

## 2014-07-19 MED ORDER — BUPIVACAINE HCL (PF) 0.5 % IJ SOLN
INTRAMUSCULAR | Status: AC
  Filled 2014-07-19: qty 30

## 2014-07-19 MED ORDER — DIPHENHYDRAMINE HCL 12.5 MG/5ML PO ELIX
12.5000 mg | ORAL_SOLUTION | Freq: Four times a day (QID) | ORAL | Status: DC | PRN
  Filled 2014-07-19: qty 5

## 2014-07-19 MED ORDER — SODIUM CHLORIDE 0.9 % IV SOLN
INTRAVENOUS | Status: DC | PRN
  Administered 2014-07-19: 16:00:00 via INTRAVENOUS

## 2014-07-19 MED ORDER — ROCURONIUM BROMIDE 100 MG/10ML IV SOLN
INTRAVENOUS | Status: DC | PRN
  Administered 2014-07-19 (×5): 10 mg via INTRAVENOUS
  Administered 2014-07-19: 20 mg via INTRAVENOUS
  Administered 2014-07-19: 10 mg via INTRAVENOUS
  Administered 2014-07-19: 20 mg via INTRAVENOUS
  Administered 2014-07-19: 40 mg via INTRAVENOUS
  Administered 2014-07-19: 10 mg via INTRAVENOUS

## 2014-07-19 MED ORDER — SCOPOLAMINE 1 MG/3DAYS TD PT72
1.0000 | MEDICATED_PATCH | Freq: Once | TRANSDERMAL | Status: DC
  Administered 2014-07-19: 1.5 mg via TRANSDERMAL

## 2014-07-19 MED ORDER — ALBUTEROL SULFATE (2.5 MG/3ML) 0.083% IN NEBU
2.5000 mg | INHALATION_SOLUTION | Freq: Four times a day (QID) | RESPIRATORY_TRACT | Status: DC | PRN
  Filled 2014-07-19: qty 3

## 2014-07-19 MED ORDER — MIDAZOLAM HCL 2 MG/2ML IJ SOLN
INTRAMUSCULAR | Status: AC
  Filled 2014-07-19: qty 2

## 2014-07-19 MED ORDER — ACETAMINOPHEN 10 MG/ML IV SOLN: 1000.0000 mg | Freq: Four times a day (QID) | INTRAVENOUS | Status: DC

## 2014-07-19 MED ORDER — PHENYLEPHRINE HCL 10 MG/ML IJ SOLN
INTRAMUSCULAR | Status: DC | PRN
  Administered 2014-07-19 (×3): 80 ug via INTRAVENOUS
  Administered 2014-07-19 (×3): 40 ug via INTRAVENOUS

## 2014-07-19 MED ORDER — ONDANSETRON HCL 4 MG PO TABS: 4.0000 mg | ORAL_TABLET | Freq: Four times a day (QID) | ORAL | Status: DC | PRN

## 2014-07-19 MED ORDER — QUETIAPINE FUMARATE ER 200 MG PO TB24
600.0000 mg | ORAL_TABLET | Freq: Every day | ORAL | Status: DC
  Administered 2014-07-20 – 2014-07-21 (×2): 600 mg via ORAL
  Filled 2014-07-19 (×3): qty 3

## 2014-07-19 MED ORDER — ROCURONIUM BROMIDE 100 MG/10ML IV SOLN
INTRAVENOUS | Status: AC
  Filled 2014-07-19: qty 1

## 2014-07-19 MED ORDER — SODIUM CHLORIDE 0.9 % IJ SOLN
INTRAMUSCULAR | Status: AC
  Filled 2014-07-19: qty 100

## 2014-07-19 MED ORDER — OXYCODONE-ACETAMINOPHEN 5-325 MG PO TABS: 1.0000 | ORAL_TABLET | ORAL | Status: DC | PRN

## 2014-07-19 MED ORDER — PHENYLEPHRINE 40 MCG/ML (10ML) SYRINGE FOR IV PUSH (FOR BLOOD PRESSURE SUPPORT)
PREFILLED_SYRINGE | INTRAVENOUS | Status: AC
  Filled 2014-07-19: qty 5

## 2014-07-19 MED ORDER — NEOSTIGMINE METHYLSULFATE 10 MG/10ML IV SOLN
INTRAVENOUS | Status: AC
  Filled 2014-07-19: qty 1

## 2014-07-19 MED ORDER — ONDANSETRON HCL 4 MG/2ML IJ SOLN: 4.0000 mg | Freq: Four times a day (QID) | INTRAMUSCULAR | Status: DC | PRN

## 2014-07-19 MED ORDER — ALBUMIN HUMAN 5 % IV SOLN
INTRAVENOUS | Status: DC | PRN
  Administered 2014-07-19 (×2): via INTRAVENOUS

## 2014-07-19 MED ORDER — LIDOCAINE HCL (CARDIAC) 20 MG/ML IV SOLN
INTRAVENOUS | Status: AC
  Filled 2014-07-19: qty 5

## 2014-07-19 MED ORDER — CEFAZOLIN SODIUM-DEXTROSE 2-3 GM-% IV SOLR
INTRAVENOUS | Status: AC
  Administered 2014-07-19 (×2): 2 g via INTRAVENOUS
  Filled 2014-07-19: qty 50

## 2014-07-19 MED ORDER — SODIUM CHLORIDE 0.9 % IJ SOLN: 9.0000 mL | INTRAMUSCULAR | Status: DC | PRN

## 2014-07-19 MED ORDER — HYDROMORPHONE HCL 1 MG/ML IJ SOLN
0.2000 mg | INTRAMUSCULAR | Status: DC | PRN
  Administered 2014-07-20 (×3): 0.2 mg via INTRAVENOUS
  Filled 2014-07-19 (×4): qty 1

## 2014-07-19 MED ORDER — PROMETHAZINE HCL 25 MG/ML IJ SOLN
INTRAMUSCULAR | Status: AC
  Administered 2014-07-19: 6.25 mg via INTRAVENOUS
  Filled 2014-07-19: qty 1

## 2014-07-19 MED ORDER — NALOXONE HCL 0.4 MG/ML IJ SOLN: 0.4000 mg | INTRAMUSCULAR | Status: DC | PRN

## 2014-07-19 MED ORDER — BECLOMETHASONE DIPROPIONATE 40 MCG/ACT IN AERS
2.0000 | INHALATION_SPRAY | Freq: Two times a day (BID) | RESPIRATORY_TRACT | Status: DC
  Filled 2014-07-19: qty 8.7

## 2014-07-19 MED ORDER — BUPIVACAINE-EPINEPHRINE 0.5% -1:200000 IJ SOLN
INTRAMUSCULAR | Status: DC | PRN
  Administered 2014-07-19: 45 mL

## 2014-07-19 MED ORDER — ONDANSETRON HCL 4 MG/2ML IJ SOLN
INTRAMUSCULAR | Status: DC | PRN
  Administered 2014-07-19: 4 mg via INTRAVENOUS

## 2014-07-19 MED ORDER — LACTATED RINGERS IV SOLN
INTRAVENOUS | Status: DC
  Administered 2014-07-19 (×4): via INTRAVENOUS

## 2014-07-19 MED ORDER — IBUPROFEN 800 MG PO TABS: 800.0000 mg | ORAL_TABLET | Freq: Three times a day (TID) | ORAL | Status: DC | PRN

## 2014-07-19 MED ORDER — PHENYLEPHRINE HCL 10 MG/ML IJ SOLN
INTRAMUSCULAR | Status: AC
Start: 2014-07-19 — End: 2014-07-19
  Filled 2014-07-19: qty 1

## 2014-07-19 MED ORDER — BUPIVACAINE-EPINEPHRINE (PF) 0.5% -1:200000 IJ SOLN
INTRAMUSCULAR | Status: AC
  Filled 2014-07-19: qty 30

## 2014-07-19 MED ORDER — CHLORHEXIDINE GLUCONATE 0.12 % MT SOLN
15.0000 mL | Freq: Two times a day (BID) | OROMUCOSAL | Status: DC
  Administered 2014-07-20 – 2014-07-23 (×7): 15 mL via OROMUCOSAL
  Filled 2014-07-19 (×10): qty 15

## 2014-07-19 MED ORDER — LIDOCAINE HCL (CARDIAC) 20 MG/ML IV SOLN
INTRAVENOUS | Status: DC | PRN
Start: 2014-07-19 — End: 2014-07-19
  Administered 2014-07-19: 100 mg via INTRAVENOUS

## 2014-07-19 MED ORDER — MIDAZOLAM HCL 2 MG/2ML IJ SOLN
INTRAMUSCULAR | Status: DC | PRN
  Administered 2014-07-19: 2 mg via INTRAVENOUS

## 2014-07-19 MED ORDER — LACTATED RINGERS IV SOLN
INTRAVENOUS | Status: DC
  Administered 2014-07-19 – 2014-07-24 (×10): via INTRAVENOUS

## 2014-07-19 MED ORDER — ALBUTEROL SULFATE HFA 108 (90 BASE) MCG/ACT IN AERS
INHALATION_SPRAY | RESPIRATORY_TRACT | Status: DC | PRN
  Administered 2014-07-19: 8 via RESPIRATORY_TRACT

## 2014-07-19 MED ORDER — EPHEDRINE SULFATE 50 MG/ML IJ SOLN
INTRAMUSCULAR | Status: DC | PRN
  Administered 2014-07-19: 10 mg via INTRAVENOUS
  Administered 2014-07-19 (×8): 5 mg via INTRAVENOUS

## 2014-07-19 MED ORDER — MEPERIDINE HCL 25 MG/ML IJ SOLN: 6.2500 mg | INTRAMUSCULAR | Status: DC | PRN

## 2014-07-19 MED ORDER — CEFAZOLIN SODIUM-DEXTROSE 2-3 GM-% IV SOLR
INTRAVENOUS | Status: AC
  Filled 2014-07-19: qty 50

## 2014-07-19 MED ORDER — PROPOFOL 10 MG/ML IV BOLUS
INTRAVENOUS | Status: DC | PRN
  Administered 2014-07-19: 200 mg via INTRAVENOUS

## 2014-07-19 MED ORDER — HYDROMORPHONE HCL 1 MG/ML IJ SOLN
0.2500 mg | INTRAMUSCULAR | Status: DC | PRN
  Administered 2014-07-19 (×4): 0.5 mg via INTRAVENOUS

## 2014-07-19 MED ORDER — HYDROMORPHONE 0.3 MG/ML IV SOLN
INTRAVENOUS | Status: DC
  Administered 2014-07-19: 19:00:00 via INTRAVENOUS
  Administered 2014-07-19: 2.59 mg via INTRAVENOUS
  Administered 2014-07-20: 1.39 mg via INTRAVENOUS
  Administered 2014-07-20: 3.79 mg via INTRAVENOUS
  Administered 2014-07-20: 02:00:00 via INTRAVENOUS
  Filled 2014-07-19 (×2): qty 25

## 2014-07-19 MED ORDER — ALBUTEROL SULFATE HFA 108 (90 BASE) MCG/ACT IN AERS
INHALATION_SPRAY | RESPIRATORY_TRACT | Status: AC
  Filled 2014-07-19: qty 6.7

## 2014-07-19 MED ORDER — BUPIVACAINE HCL (PF) 0.5 % IJ SOLN
INTRAMUSCULAR | Status: DC | PRN
  Administered 2014-07-19: 10 mL

## 2014-07-19 MED ORDER — PHENYLEPHRINE HCL 10 MG/ML IJ SOLN
10.0000 mg | INTRAVENOUS | Status: DC | PRN
  Administered 2014-07-19: 30 ug/min via INTRAVENOUS

## 2014-07-19 MED ORDER — CETYLPYRIDINIUM CHLORIDE 0.05 % MT LIQD
7.0000 mL | Freq: Two times a day (BID) | OROMUCOSAL | Status: DC
  Administered 2014-07-20 – 2014-07-22 (×4): 7 mL via OROMUCOSAL

## 2014-07-19 MED ORDER — DIPHENHYDRAMINE HCL 50 MG/ML IJ SOLN
12.5000 mg | Freq: Four times a day (QID) | INTRAMUSCULAR | Status: DC | PRN
  Administered 2014-07-19 – 2014-07-20 (×2): 12.5 mg via INTRAVENOUS
  Filled 2014-07-19 (×2): qty 1

## 2014-07-19 MED ORDER — SCOPOLAMINE 1 MG/3DAYS TD PT72
MEDICATED_PATCH | TRANSDERMAL | Status: AC
  Administered 2014-07-19: 1.5 mg via TRANSDERMAL
  Filled 2014-07-19: qty 1

## 2014-07-19 MED ORDER — GLYCOPYRROLATE 0.2 MG/ML IJ SOLN
INTRAMUSCULAR | Status: AC
  Filled 2014-07-19: qty 3

## 2014-07-19 MED ORDER — ALBUMIN HUMAN 5 % IV SOLN
INTRAVENOUS | Status: AC
  Filled 2014-07-19: qty 250

## 2014-07-19 MED ORDER — PROMETHAZINE HCL 25 MG/ML IJ SOLN
6.2500 mg | INTRAMUSCULAR | Status: DC | PRN
Start: 2014-07-19 — End: 2014-07-19
  Administered 2014-07-19: 6.25 mg via INTRAVENOUS

## 2014-07-19 MED ORDER — ALBUTEROL SULFATE HFA 108 (90 BASE) MCG/ACT IN AERS: 2.0000 | INHALATION_SPRAY | Freq: Four times a day (QID) | RESPIRATORY_TRACT | Status: DC | PRN

## 2014-07-19 MED ORDER — SODIUM CHLORIDE 0.9 % IV SOLN
Freq: Once | INTRAVENOUS | Status: AC
  Administered 2014-07-19: 22:00:00 via INTRAVENOUS

## 2014-07-19 MED ORDER — EPHEDRINE 5 MG/ML INJ
INTRAVENOUS | Status: AC
  Filled 2014-07-19: qty 10

## 2014-07-19 MED ORDER — ARIPIPRAZOLE 15 MG PO TABS
15.0000 mg | ORAL_TABLET | Freq: Every day | ORAL | Status: DC
  Administered 2014-07-20 – 2014-07-21 (×2): 15 mg via ORAL
  Filled 2014-07-19 (×3): qty 1

## 2014-07-19 MED ORDER — ONDANSETRON HCL 4 MG/2ML IJ SOLN
4.0000 mg | Freq: Four times a day (QID) | INTRAMUSCULAR | Status: DC | PRN
  Administered 2014-07-20 – 2014-07-27 (×6): 4 mg via INTRAVENOUS
  Filled 2014-07-19 (×6): qty 2

## 2014-07-19 SURGICAL SUPPLY — 56 items
APPLICATOR COTTON TIP 6IN STRL (MISCELLANEOUS) ×4 IMPLANT
BLADE SURG 10 STRL SS (BLADE) ×4 IMPLANT
BLADE SURG 11 STRL SS (BLADE) ×4 IMPLANT
CABLE HIGH FREQUENCY MONO STRZ (ELECTRODE) ×1 IMPLANT
CANISTER SUCT 3000ML (MISCELLANEOUS) ×4 IMPLANT
CLOTH BEACON ORANGE TIMEOUT ST (SAFETY) ×4 IMPLANT
CONT PATH 16OZ SNAP LID 3702 (MISCELLANEOUS) ×4 IMPLANT
DECANTER SPIKE VIAL GLASS SM (MISCELLANEOUS) ×8 IMPLANT
DISSECTOR BLUNT TIP ENDO 5MM (MISCELLANEOUS) ×1 IMPLANT
DRAIN CHANNEL 19F RND (DRAIN) ×1 IMPLANT
DRSG COVADERM PLUS 2X2 (GAUZE/BANDAGES/DRESSINGS) ×8 IMPLANT
DRSG OPSITE POSTOP 3X4 (GAUZE/BANDAGES/DRESSINGS) ×4 IMPLANT
DRSG OPSITE POSTOP 4X10 (GAUZE/BANDAGES/DRESSINGS) ×1 IMPLANT
DURAPREP 26ML APPLICATOR (WOUND CARE) ×4 IMPLANT
ELECT REM PT RETURN 9FT ADLT (ELECTROSURGICAL) ×4
ELECTRODE REM PT RTRN 9FT ADLT (ELECTROSURGICAL) IMPLANT
EVACUATOR SILICONE 100CC (DRAIN) ×1 IMPLANT
FILTER SMOKE EVAC LAPAROSHD (FILTER) ×1 IMPLANT
GLOVE BIO SURGEON STRL SZ 6.5 (GLOVE) ×10 IMPLANT
GLOVE BIOGEL PI IND STRL 7.0 (GLOVE) ×6 IMPLANT
GLOVE BIOGEL PI INDICATOR 7.0 (GLOVE) ×5
GOWN STRL REUS W/ TWL LRG LVL3 (GOWN DISPOSABLE) ×21 IMPLANT
GOWN STRL REUS W/TWL LRG LVL3 (GOWN DISPOSABLE) ×28
HEMOSTAT SURGICEL 4X8 (HEMOSTASIS) ×2 IMPLANT
NDL SAFETY ECLIPSE 18X1.5 (NEEDLE) ×3 IMPLANT
NDL SPNL 18GX3.5 QUINCKE PK (NEEDLE) ×3 IMPLANT
NEEDLE HYPO 18GX1.5 SHARP (NEEDLE) ×4
NEEDLE INSUFFLATION 120MM (ENDOMECHANICALS) ×4 IMPLANT
NEEDLE SPNL 18GX3.5 QUINCKE PK (NEEDLE) ×4 IMPLANT
NS IRRIG 1000ML POUR BTL (IV SOLUTION) ×4 IMPLANT
PACK LAVH (CUSTOM PROCEDURE TRAY) ×4 IMPLANT
PAD TRENDELENBURG OR TABLE (MISCELLANEOUS) ×4 IMPLANT
PROTECTOR NERVE ULNAR (MISCELLANEOUS) ×4 IMPLANT
RELOAD PROXIMATE 75MM BLUE (ENDOMECHANICALS) ×4 IMPLANT
RELOAD STAPLE 75 3.8 BLU REG (ENDOMECHANICALS) IMPLANT
SCISSORS LAP 5X35 DISP (ENDOMECHANICALS) ×1 IMPLANT
SET IRRIG TUBING LAPAROSCOPIC (IRRIGATION / IRRIGATOR) ×1 IMPLANT
SHEARS HARMONIC ACE PLUS 36CM (ENDOMECHANICALS) ×1 IMPLANT
SLEEVE XCEL OPT CAN 5 100 (ENDOMECHANICALS) ×4 IMPLANT
SPONGE LAP 18X18 X RAY DECT (DISPOSABLE) ×2 IMPLANT
STAPLER GUN LINEAR PROX 60 (STAPLE) ×1 IMPLANT
SUT PDS AB 0 CTX 60 (SUTURE) ×1 IMPLANT
SUT VIC AB 0 CT1 36 (SUTURE) ×5 IMPLANT
SUT VIC AB 0 CTB1 36 (SUTURE) ×4 IMPLANT
SUT VIC AB 2-0 CT1 18 (SUTURE) ×14 IMPLANT
SUT VICRYL 0 UR6 27IN ABS (SUTURE) ×8 IMPLANT
SUT VICRYL 4-0 PS2 18IN ABS (SUTURE) ×8 IMPLANT
SYR 30ML LL (SYRINGE) ×4 IMPLANT
SYR BULB IRRIGATION 50ML (SYRINGE) ×4 IMPLANT
TOWEL OR 17X24 6PK STRL BLUE (TOWEL DISPOSABLE) ×12 IMPLANT
TRAY FOLEY CATH 14FR (SET/KITS/TRAYS/PACK) ×4 IMPLANT
TROCAR 5M 150ML BLDLS (TROCAR) ×1 IMPLANT
TROCAR DILATING TIP 12MM 150MM (ENDOMECHANICALS) ×2 IMPLANT
TROCAR OPTI TIP 5M 100M (ENDOMECHANICALS) ×4 IMPLANT
TROCAR XCEL DIL TIP R 11M (ENDOMECHANICALS) ×4 IMPLANT
WARMER LAPAROSCOPE (MISCELLANEOUS) ×4 IMPLANT

## 2014-07-19 NOTE — Anesthesia Postprocedure Evaluation (Signed)
  Anesthesia Post-op Note  Patient: Patricia [name]  Procedure(s) Performed: Procedure(s) (LRB): ATTEMPTED LAPAROSCOPIC ASSISTED VAGINAL HYSTERECTOMY,  (Bilateral) HYSTERECTOMY ABDOMINAL with bilateral salpingectomy (Bilateral) SMALL BOWEL RESECTION (N/A)  Patient Location: PACU  Anesthesia Type: General  Level of Consciousness: awake and alert   Airway and Oxygen Therapy: Patient Spontanous Breathing  Post-op Pain: mild  Post-op Assessment: Post-op Vital signs reviewed, Patient's Cardiovascular Status Stable, Respiratory Function Stable, Patent Airway and No signs of Nausea or vomiting  Last Vitals:  Filed Vitals:   07/19/14 1830  BP:   Pulse: 92  Temp:   Resp: 20    Post-op Vital Signs: stable   Complications: No apparent anesthesia complications Post operatively, patient complaining of chest pain. EKG performed with some T wave inversion. Cardiac enzymes and CBC sent to cycle. Patient now denies chest pain and states that it is more abdominal pain. Will be sent for monitoring in the ICU

## 2014-07-19 NOTE — Op Note (Signed)
07/19/2014  4:15 PM  PATIENT:  Patricia [name]  43 y.o. female  PRE-OPERATIVE DIAGNOSIS:  Menorrhagia, anemia, fibroid, morbid obesity  POST-OPERATIVE DIAGNOSIS:  Same + dense adhesions    PROCEDURE:  ATTEMPTED LAPAROSCOPIC VAGINAL HYSTERECTOMY, EXTENSIVE ENTEROLYSIS, EXTENSIVE ADHESIOLYSIS, BILATERAL OOPHERECTOMY, RIGHT SALPINGECTOMY  SURGEON:  Surgeon(s) and Role:     Emily Filbert, MD - Primary      ASSISTANTS: Lavonia Drafts   ANESTHESIA:   general  EBL:  Total I/O In: [id number] [I.V.:3000; Blood:335; IV Piggyback:500] Out: 2550 [Urine:800; Blood:1750]  BLOOD ADMINISTERED:2 units CC PRBC  DRAINS: JP drain in pelvis, NG tube   LOCAL MEDICATIONS USED:  NONE  SPECIMEN:  Source of Specimen:  uterus, both ovaries, right oviducts, portion of small bowel  DISPOSITION OF SPECIMEN:  PATHOLOGY  COUNTS:  YES  TOURNIQUET:  * No tourniquets in log *  DICTATION: .Dragon Dictation  PLAN OF CARE: Admit to inpatient   PATIENT DISPOSITION:  PACU - hemodynamically stable.   Delay start of Pharmacological VTE agent (>24hrs) due to surgical blood loss or risk of bleeding: not applicable  The risks, benefits, and alternatives of surgery were explained, understood, and accepted. Consents were signed. All questions were answered. In the operating room, general anesthesia was applied without complication. She was a dorsal lithotomy position and her morbidly obese abdomen and vagina were prepped and draped in the usual sterile fashion. A Foley catheter was placed and drained clear urine throughout this case. A timeout procedure was performed. A tenaculum was placed on her cervix. The vagina was noted to be extremely long and required an extra long speculum. He was turned to the abdomen. 10 mL's of 0.5% Marcaine was injected into the umbilical scan. A vertical incision was made here, about 1 cm in length. A varies needle was placed and low flow CO2 was used to insufflate the abdomen.  Approximately 3 L of CO2 into the abdomen until the pressure was at 18. At this point I placed a 10 mm trocar. It appeared that I was in adhesions so I withdrew the normal trocar and ask for a long trocar [place] on this patient's centripetal obesity. I then placed the long trocar and laparoscopy confirmed correct placement. There did appear to be omental adhesions to the anterior abdominal wall. I was unable to see the sidewalls to place ports in the lower quadrants. I therefore placed a 5 mm port in the left upper quadrant and used a 5 mm scope to visualize the pelvis more fully. At this point I could fully appreciate the omentum and bowel that were very close to the trocar at the umbilicus. I then placed a 5 mm port in each lower quadrant under direct laparoscopic visualization as well as one in the right upper quadrant. We used scissors laparoscopically to dissect the omental and bowel adhesions that were stuck to the anterior abdominal wall the trocar itself was free and was not in the bowel. This extensive anterior lysis took approximately 3 hours. Her her younger sister was in the waiting room and she was kept abreast of the situation. At this point visualization was adequate. We proceeded with the hysterectomy portion of the procedure. Please note that there are extensive adhesions of the large bowel to the fundus and posterior portion and right side of the uterus. These were carefully taken down as well. The round ligaments were identified and ligated using the Harmonic scalpel. The utero ovarian ligaments were identified and taken down using the Harmonic  scalpel as well. There was no visible oviduct on her left, consistent with a history of a ectopic pregnancy. The oviduct on the right were had been transected consistent with her history of a tubal ligation. Both ovaries were densely adherent into the uterus sidewall. These adhesions were carefully taken down as well. At this Harmonic she was given a repeat  dose of her antibiotics (Ancef). Once the utero-ovarian ligaments were taken down I proceeded to the vaginal portion of the case. A long Bonnano speculum was used to visualize the cervix. It was grasped with a single-tooth tenaculum and pulled downwards. I injected approximately 60 mL's of a dilute Marcaine solution. A circumferential incision with the scalpel at the cervicovaginal junction. I entered the posterior cul-de-sac. The anterior peritoneum was identified in the anterior cul-de-sac was entered as well. The uterosacral ligaments were identified, clamped, cut, and ligated. Visualization was difficult due to the length of the vagina as well as the redundant labia. At this point tension on the uterus yielded no descensus so we changed gown and gloves and proceeded to look laparoscopically. Due to the large amount of bowel following into the pelvis despite steep Trendelenburg, we were unable to see any tissue that was holding the uterus still in. I therefore decided to proceed with a laparotomy. A transverse incision was made beneath her large pannus. The incision was carried down to the septoplasty station the fascia. Fascia was scored in the midline and extended bilaterally with curved Mayo scissors. The rectus muscles worse in the midline and separated in a transverse fashion approximately two thirds of the way through. The inferior epigastric vessels were left intact. The peritoneum was entered with hemostats. An O'Connor-O'Sullivan retractor was placed after pushing the bowel up into the abdomen out of the operative site. At this point we were able to separate the uterus entirely from its pelvic attachments and removed the uterus. Her pelvis was very deep and it was extremely difficult to find the edges of the vaginal cuff. Eye pad the assistant place a sponge stick into her vagina so that we could see this instrument coming into her pelvis. I was then able to grasp the edges of the vaginal cuff and closed  with a 0 Vicryl running locking suture. In order to facilitate setting the vaginal cuff I did have to remove the right ovary and the left ovary which were still densely adherent into the pelvis. At this point I inspected the small bowel and found a proximally 1 cm hole in the small bowel. General surgery was consulted and the injured portion of small bowel was removed. An NG tube was placed by the anesthesiology personnel. During this interim she was given 2 units of blood. At this point the general surgeon and I inspected the vaginal cuff it appeared to be hemostatic and a piece of Surgicel was placed over the vaginal cuff. A JP drain was placed and came out through one of the right lower quadrant 5 mm ports. This was sutured in with nylon. The skin incisions were all closed with 4-0 Vicryl and Steri-Strips and Dermabond. Her Foley catheter draining clear urine throughout the case. She was extubated and taken to the recovery room in stable condition.
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
## 2014-07-19 NOTE — Anesthesia Procedure Notes (Signed)
Procedure Name: Intubation Date/Time: 07/19/2014 10:25 AM Performed by: Raenette Rover Pre-anesthesia Checklist: Patient identified, Emergency Drugs available, Suction available and Patient being monitored Patient Re-evaluated:Patient Re-evaluated prior to inductionOxygen Delivery Method: Circle system utilized Preoxygenation: Pre-oxygenation with 100% oxygen Intubation Type: IV induction Ventilation: Mask ventilation without difficulty Laryngoscope Size: Mac and 3 Grade View: Grade II Tube type: Oral Tube size: 7.0 mm Number of attempts: 1 Airway Equipment and Method: Stylet and Bite block Placement Confirmation: ETT inserted through vocal cords under direct vision,  positive ETCO2,  CO2 detector and breath sounds checked- equal and bilateral Secured at: 21 cm Tube secured with: Tape Dental Injury: Teeth and Oropharynx as per pre-operative assessment

## 2014-07-19 NOTE — Consult Note (Signed)
Intraoperative consultation note  Indications: I was asked to consult intraoperatively on the patient due to injury during laparoscopic converted to open hysterectomy secondary to pelvic pain and dense intra-abdominal adhesions. Small bowel injury was noted by Dr. Hulan Fray during the procedure I was asked to consult. Patient has a history of severe pelvic pain, PID and ectopic pregnancy. She presented for a laparoscopic hysterectomy. During the laparoscopic portion dense intra-abdominal adhesions were taken down with great care. This was converted to open due to the patient's anatomy severe adhesions. Small bowel was examined in the mid to distal jejunum a full-thickness injury was noted and closed with a pursestring suture of Vicryl. I was asked to consult for management of this.  Description of procedure: I enter the operating room and [facility]. I scrubbed in. I examined the small bowel with Dr. Hulan Fray in the injury was noted. I ran the small bowel as far as I could through the Pfannenstiel incision both proximal and distal to injury. This was a 1 cm injury to the small bowel which is full-thickness with deserosalization on either side of it for approximately 1-2 cm. I felt that resection would be the best choice.  The area was resected using 2 firings of a GIA 75 stapling device. The mesentery was resected using Vicryl ties. A side-to-side functional end in anastomosis was created with a single fire of the GIA 75 stapling device and common enterotomy closed with a TX 60. Stitch in the crotch the anastomosis of Vicryl. Small mesenteric defect closed with Vicryl as well. The lumen was patent to to finger breaths with no evidence of leakage. There is no tension on the anastomosis.  At this portion of the case , Dr Hulan Fray completed the procedure.  All counts were correct.  Pt is in stable condition and can be followed by [facility].Marland Kitchen

## 2014-07-19 NOTE — Anesthesia Preprocedure Evaluation (Signed)
Anesthesia Evaluation  Patient identified by MRN, date of birth, ID band  Reviewed: Allergy & Precautions, H&P , NPO status , Patient's Chart, lab work & pertinent test results, reviewed documented beta blocker date and time   Airway Mallampati: II  TM Distance: >3 FB Neck ROM: full    Dental no notable dental hx. (+) Teeth Intact   Pulmonary asthma , sleep apnea and Continuous Positive Airway Pressure Ventilation , former smoker,  Has Mild wheeze B will use inhaler prior to OR. Also will be placed on OSA orders post-op.   + wheezing      Cardiovascular hypertension, Pt. on home beta blockers     Neuro/Psych Bipolar Disorder negative neurological ROS     GI/Hepatic Neg liver ROS, GERD-  ,  Endo/Other  negative endocrine ROSdiabetesMorbid obesity  Renal/GU negative Renal ROS     Musculoskeletal   Abdominal (+) + obese,   Peds  Hematology   Anesthesia Other Findings   Reproductive/Obstetrics negative OB ROS                             Anesthesia Physical Anesthesia Plan  ASA: III  Anesthesia Plan: General   Post-op Pain Management:    Induction: Intravenous  Airway Management Planned: Oral ETT  Additional Equipment:   Intra-op Plan:   Post-operative Plan: Extubation in OR  Informed Consent: I have reviewed the patients History and Physical, chart, labs and discussed the procedure including the risks, benefits and alternatives for the proposed anesthesia with the patient or authorized representative who has indicated his/her understanding and acceptance.   Dental Advisory Given  Plan Discussed with: CRNA and Surgeon  Anesthesia Plan Comments:         Anesthesia Quick Evaluation

## 2014-07-19 NOTE — Transfer of Care (Signed)
Immediate Anesthesia Transfer of Care Note  Patient: Patricia [name]  Procedure(s) Performed: Procedure(s): ATTEMPTED LAPAROSCOPIC ASSISTED VAGINAL HYSTERECTOMY,  (Bilateral) HYSTERECTOMY ABDOMINAL with bilateral salpingectomy (Bilateral) SMALL BOWEL RESECTION (N/A)  Patient Location: PACU  Anesthesia Type:General  Level of Consciousness: awake, sedated and patient cooperative  Airway & Oxygen Therapy: Patient connected to nasal cannula oxygen and Patient connected to face mask oxygen  Post-op Assessment: Report given to PACU RN, Post -op Vital signs reviewed and stable and Patient moving all extremities X 4  Post vital signs: Reviewed and stable  Complications: No apparent anesthesia complications

## 2014-07-20 DIAGNOSIS — I517 Cardiomegaly: Secondary | ICD-10-CM

## 2014-07-20 DIAGNOSIS — Z9889 Other specified postprocedural states: Secondary | ICD-10-CM

## 2014-07-20 DIAGNOSIS — R0789 Other chest pain: Secondary | ICD-10-CM

## 2014-07-20 LAB — COMPREHENSIVE METABOLIC PANEL
ALT: 15 U/L (ref 0–35)
AST: 38 U/L — ABNORMAL HIGH (ref 0–37)
Albumin: 3.2 g/dL — ABNORMAL LOW (ref 3.5–5.2)
Alkaline Phosphatase: 29 U/L — ABNORMAL LOW (ref 39–117)
Anion gap: 6 (ref 5–15)
BUN: 11 mg/dL (ref 6–23)
CALCIUM: 7.8 mg/dL — AB (ref 8.4–10.5)
CO2: 26 mmol/L (ref 19–32)
CREATININE: 0.95 mg/dL (ref 0.50–1.10)
Chloride: 104 mEq/L (ref 96–112)
GFR calc non Af Amer: 72 mL/min — ABNORMAL LOW (ref >90)
GFR, EST AFRICAN AMERICAN: 83 mL/min — AB (ref >90)
GLUCOSE: 126 mg/dL — AB (ref 70–99)
Potassium: 4.4 mmol/L (ref 3.5–5.1)
Sodium: 136 mmol/L (ref 135–145)
TOTAL PROTEIN: 5.8 g/dL — AB (ref 6.0–8.3)
Total Bilirubin: 0.6 mg/dL (ref 0.3–1.2)

## 2014-07-20 LAB — TROPONIN I
TROPONIN I: 0.03 ng/mL (ref <0.031)
Troponin I: 0.03 ng/mL (ref <0.031)

## 2014-07-20 LAB — CK TOTAL AND CKMB (NOT AT ARMC)
CK TOTAL: 2314 U/L — AB (ref 7–177)
CK, MB: 7.7 ng/mL — AB (ref 0.3–4.0)
CK, MB: 13.1 ng/mL (ref 0.3–4.0)
Relative Index: 0.7 (ref 0.0–2.5)
Relative Index: 0.6 (ref 0.0–2.5)
Total CK: 1149 U/L — ABNORMAL HIGH (ref 7–177)

## 2014-07-20 LAB — CBC
HCT: 29.7 % — ABNORMAL LOW (ref 36.0–46.0)
HEMOGLOBIN: 10 g/dL — AB (ref 12.0–15.0)
MCH: 26.4 pg (ref 26.0–34.0)
MCHC: 33.7 g/dL (ref 30.0–36.0)
MCV: 78.4 fL (ref 78.0–100.0)
Platelets: 247 10*3/uL (ref 150–400)
RBC: 3.79 MIL/uL — AB (ref 3.87–5.11)
RDW: 18.3 % — ABNORMAL HIGH (ref 11.5–15.5)
WBC: 13.2 10*3/uL — ABNORMAL HIGH (ref 4.0–10.5)

## 2014-07-20 MED ORDER — DIAZEPAM 5 MG/ML IJ SOLN
1.0000 mg | Freq: Once | INTRAMUSCULAR | Status: AC
  Administered 2014-07-20: 1 mg via INTRAVENOUS
  Filled 2014-07-20: qty 2

## 2014-07-20 MED ORDER — PHENOL 1.4 % MT LIQD
1.0000 | OROMUCOSAL | Status: DC | PRN
  Administered 2014-07-20 – 2014-07-21 (×3): 1 via OROMUCOSAL
  Filled 2014-07-20: qty 177

## 2014-07-20 MED ORDER — ACETAMINOPHEN 10 MG/ML IV SOLN
1000.0000 mg | Freq: Four times a day (QID) | INTRAVENOUS | Status: AC
  Administered 2014-07-20 – 2014-07-21 (×4): 1000 mg via INTRAVENOUS
  Filled 2014-07-20 (×4): qty 100

## 2014-07-20 MED ORDER — FENTANYL 25 MCG/HR TD PT72
25.0000 ug | MEDICATED_PATCH | TRANSDERMAL | Status: DC
  Administered 2014-07-20: 25 ug via TRANSDERMAL
  Filled 2014-07-20: qty 1

## 2014-07-20 MED ORDER — ATENOLOL 50 MG PO TABS
50.0000 mg | ORAL_TABLET | Freq: Every day | ORAL | Status: DC
  Administered 2014-07-20 – 2014-07-21 (×2): 50 mg via ORAL
  Filled 2014-07-20 (×3): qty 1

## 2014-07-20 MED ORDER — DIPHENHYDRAMINE HCL 50 MG/ML IJ SOLN
12.5000 mg | Freq: Four times a day (QID) | INTRAMUSCULAR | Status: DC | PRN
  Administered 2014-07-22 – 2014-07-27 (×8): 12.5 mg via INTRAVENOUS
  Filled 2014-07-20 (×9): qty 1

## 2014-07-20 MED ORDER — HYDROMORPHONE HCL 1 MG/ML IJ SOLN
0.2000 mg | INTRAMUSCULAR | Status: DC | PRN
  Administered 2014-07-20 – 2014-07-22 (×5): 0.2 mg via INTRAVENOUS
  Filled 2014-07-20 (×4): qty 1

## 2014-07-20 NOTE — Plan of Care (Signed)
Problem: Phase I Progression Outcomes Goal: OOB as tolerated unless otherwise ordered Outcome: Progressing Patient up to chair and ambulated to bathroom door and back to chair.
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
## 2014-07-20 NOTE — Progress Notes (Signed)
  Echocardiogram 2D Echocardiogram has been performed.  Diamond Nickel 07/20/2014, 3:14 PM

## 2014-07-20 NOTE — Progress Notes (Signed)
CSW acknowledged consult for assisting the patient with an advanced directive.   CSW spoke with hospital chaplain, who reported intention to follow up with the patient.
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
## 2014-07-20 NOTE — Anesthesia Postprocedure Evaluation (Signed)
  Anesthesia Post-op Note  Patient: Patricia [name]  Procedure(s) Performed: Procedure(s): ATTEMPTED LAPAROSCOPIC ASSISTED VAGINAL HYSTERECTOMY,  (Bilateral) HYSTERECTOMY ABDOMINAL with bilateral salpingectomy (Bilateral) SMALL BOWEL RESECTION (N/A)  Patient Location: ICU  Anesthesia Type:General  Level of Consciousness: awake, alert  and oriented  Airway and Oxygen Therapy: Patient Spontanous Breathing and Patient connected to nasal cannula oxygen  Post-op Pain: moderate  Post-op Assessment: Post-op Vital signs reviewed, Patient's Cardiovascular Status Stable and Respiratory Function Stable  Post-op Vital Signs: Reviewed and stable  Last Vitals:  Filed Vitals:   07/20/14 0800  BP: 118/59  Pulse: 97  Temp:   Resp: 20    Complications: No apparent anesthesia complications

## 2014-07-20 NOTE — Progress Notes (Signed)
1 Day Post-Op  Subjective: Sore, complains of incisional pain   Objective: Vital signs in last 24 hours: Temp:  [97.5 F (36.4 C)-100.1 F (37.8 C)] 100.1 F (37.8 C) (12/23 0738) Pulse Rate:  [67-118] 102 (12/23 0900) Resp:  [11-25] 20 (12/23 0900) BP: (95-143)/(49-95) 124/65 mmHg (12/23 0900) SpO2:  [89 %-100 %] 99 % (12/23 0900) Weight:  [116.574 kg (257 lb)] 116.574 kg (257 lb) (12/22 1912) Last BM Date: 07/18/14 1750 BLOOD LOSS 1750 URINE OUTPUT  BOTH RECORDED. Tm 100.1 VSS tRANSFUSED 2 units  Intake/Output from previous day: 12/22 0701 - 12/23 0700 In: 7596.1 [I.V.:5707.8; Blood:1388.3; IV [id number] Out: [id number] [Urine:1750; Drains:105; Blood:1750] Intake/Output this shift: Total I/O In: 250 [I.V.:250] Out: 200 [Urine:200]  General appearance: alert, cooperative, mild distress and from pain GI: obese, soft, but tender wounds ok rare bowel sounds  Lab Results:   Recent Labs  07/19/14 1805 07/20/14 0614  WBC 10.9* 13.2*  HGB 9.0* 10.0*  HCT 27.8* 29.7*  PLT 260 247    BMET  Recent Labs  07/20/14 0615  NA 136  K 4.4  CL 104  CO2 26  GLUCOSE 126*  BUN 11  CREATININE 0.95  CALCIUM 7.8*   PT/INR No results for input(s): LABPROT, INR in the last 72 hours.   Recent Labs Lab 07/20/14 0615  AST 38*  ALT 15  ALKPHOS 29*  BILITOT 0.6  PROT 5.8*  ALBUMIN 3.2*     Lipase     Component Value Date/Time   LIPASE 22 04/19/2010 0545     Studies/Results: No results found.  Medications: . antiseptic oral rinse  7 mL Mouth Rinse q12n4p  . ARIPiprazole  15 mg Oral QHS  . beclomethasone  2 puff Inhalation BID  . chlorhexidine  15 mL Mouth Rinse BID  . fentaNYL  25 mcg Transdermal Q72H  . QUEtiapine  600 mg Oral QHS   . lactated ringers 125 mL/hr at 07/20/14 0604   Prior to Admission medications   Medication Sig Start Date End Date Taking? Authorizing Provider  albuterol (PROVENTIL HFA;VENTOLIN HFA) 108 (90 BASE) MCG/ACT inhaler Inhale 2  puffs into the lungs every 6 (six) hours as needed for shortness of breath.    Yes Historical Provider, MD  albuterol (PROVENTIL) (2.5 MG/3ML) 0.083% nebulizer solution Take 2.5 mg by nebulization every 6 (six) hours as needed for shortness of breath.   Yes Historical Provider, MD  ARIPiprazole (ABILIFY) 15 MG tablet Take 15 mg by mouth at bedtime.    Yes Historical Provider, MD  atenolol (TENORMIN) 50 MG tablet Take 50 mg by mouth daily.    Yes Historical Provider, MD  atorvastatin (LIPITOR) 40 MG tablet Take 1 tablet (40 mg total) by mouth daily at 6 PM. 12/01/13  Yes Leone Brand, MD  beclomethasone (QVAR) 40 MCG/ACT inhaler Inhale into the lungs 2 (two) times daily.   Yes Historical Provider, MD  esomeprazole (NEXIUM) 40 MG capsule Take 40 mg by mouth at bedtime.    Yes Historical Provider, MD  loratadine (CLARITIN) 10 MG tablet Take 10 mg by mouth daily.   Yes Historical Provider, MD  QUEtiapine (SEROQUEL XR) 200 MG 24 hr tablet Take 200 mg by mouth at bedtime. Take with 400 mg tablet to = total dose of 600 mg   Yes Historical Provider, MD  QUEtiapine (SEROQUEL XR) 400 MG 24 hr tablet Take 400 mg by mouth at bedtime. Take with 200 mg tablet to = total dose of 600 mg  Yes Historical Provider, MD  SUMAtriptan (IMITREX) 50 MG tablet Take 1 tablet (50 mg total) by mouth every 2 (two) hours as needed for migraine. 03/15/14  Yes Hoy Morn, MD  traZODone (DESYREL) 100 MG tablet Take 200 mg by mouth at bedtime.    Yes Historical Provider, MD  metFORMIN (GLUCOPHAGE) 500 MG tablet Take 500 mg by mouth at bedtime. 1 tablet daily for 1 week, if not experiencing side effects take twice daily 12/01/13   Leone Brand, MD  sucralfate (CARAFATE) 1 G tablet Take 1 tablet (1 g total) by mouth 4 (four) times daily. Patient not taking: Reported on 07/07/2014 12/19/13   Tanna Furry, MD     Assessment/Plan 1.  Menorrhagia, anemia, fibroid, morbid obesity ATTEMPTED LAPAROSCOPIC VAGINAL HYSTERECTOMY, EXTENSIVE  ENTEROLYSIS, EXTENSIVE ADHESIOLYSIS, BILATERAL OOPHERECTOMY, RIGHT SALPINGECTOMY, Small bowel injury with SBR. Dr Clovia Cuff 2.  Asthma 3.  Bipolar 4.  Hypertension 5.  GERD 6.  Hx of Migraines 7.  Body mass index is 44.09 kg/(m^2). 8.  Anemia  9.  Prediabetes   Plan: OOB, IS, PAIN CONTROL, START MOBILIZING   LOS: 1 day    Jaleen Grupp 07/20/2014

## 2014-07-20 NOTE — Progress Notes (Signed)
1 Day Post-Op Procedure(s) (LRB): ATTEMPTED LAPAROSCOPIC ASSISTED VAGINAL HYSTERECTOMY,  (Bilateral) HYSTERECTOMY ABDOMINAL with bilateral ooperectomy and right salpingectomy SMALL BOWEL RESECTION (N/A)  Subjective: Patient reports incisional pain as well as gas pain. No flatus. Very itchy with the dilaudid PCA. Complains of pain at second IV site.  Objective: I have reviewed patient's vital signs, intake and output, medications and labs.  General: alert Resp: clear to auscultation bilaterally Cardio: regular rate and rhythm, S1, S2 normal, no murmur, click, rub or gallop GI: hypoactive BS but no tympany  Assessment: s/p Procedure(s): ATTEMPTED LAPAROSCOPIC ASSISTED VAGINAL HYSTERECTOMY,  (Bilateral) HYSTERECTOMY ABDOMINAL with bilateral salpingectomy (Bilateral) SMALL BOWEL RESECTION (N/A):  Plan: stable and I will see if a transdermal fentanyl patch will cause her less itching than her PCA. NPO and NGT until flatus  LOS: 1 day    Patricia [name] C. 07/20/2014, 6:48 AM

## 2014-07-20 NOTE — Progress Notes (Signed)
Results for ANGELIZE, URICH (MRN [id number]) as of 07/20/2014 03:50  Ref. Range 07/20/2014 01:25  CK, MB Latest Range: 0.3-4.0 ng/mL 7.7 (HH)  CK Total Latest Range: 7-177 U/L 1149 (H)  Troponin I Latest Range: <0.031 ng/mL 0.03   Report called to Dr. Jillyn Hidden, no new orders

## 2014-07-20 NOTE — Progress Notes (Signed)
UR chart review completed.  

## 2014-07-20 NOTE — Addendum Note (Signed)
Addendum  created 07/20/14 0817 by Jonna Munro, CRNA   Modules edited: Notes Section   Notes Section:  File: [id number]

## 2014-07-21 DIAGNOSIS — R079 Chest pain, unspecified: Secondary | ICD-10-CM | POA: Diagnosis not present

## 2014-07-21 LAB — TYPE AND SCREEN
ABO/RH(D): O POS
Antibody Screen: NEGATIVE
UNIT DIVISION: 0
UNIT DIVISION: 0
Unit division: 0
Unit division: 0

## 2014-07-21 LAB — CBC
HEMATOCRIT: 26.8 % — AB (ref 36.0–46.0)
Hemoglobin: 9 g/dL — ABNORMAL LOW (ref 12.0–15.0)
MCH: 26.4 pg (ref 26.0–34.0)
MCHC: 33.6 g/dL (ref 30.0–36.0)
MCV: 78.6 fL (ref 78.0–100.0)
PLATELETS: 197 10*3/uL (ref 150–400)
RBC: 3.41 MIL/uL — ABNORMAL LOW (ref 3.87–5.11)
RDW: 18.6 % — AB (ref 11.5–15.5)
WBC: 14.4 10*3/uL — AB (ref 4.0–10.5)

## 2014-07-21 MED ORDER — PANTOPRAZOLE SODIUM 40 MG IV SOLR
40.0000 mg | Freq: Every morning | INTRAVENOUS | Status: DC
  Administered 2014-07-21: 40 mg via INTRAVENOUS
  Filled 2014-07-21 (×2): qty 40

## 2014-07-21 MED ORDER — HYDROMORPHONE HCL 1 MG/ML IJ SOLN
0.2000 mg | Freq: Once | INTRAMUSCULAR | Status: AC
  Administered 2014-07-21: 0.2 mg via INTRAVENOUS
  Filled 2014-07-21: qty 1

## 2014-07-21 MED ORDER — HEPARIN SODIUM (PORCINE) 5000 UNIT/ML IJ SOLN
5000.0000 [IU] | Freq: Three times a day (TID) | INTRAMUSCULAR | Status: DC
  Administered 2014-07-21 – 2014-07-25 (×12): 5000 [IU] via SUBCUTANEOUS
  Filled 2014-07-21 (×24): qty 1

## 2014-07-21 MED ORDER — SUCRALFATE 1 GM/10ML PO SUSP
1.0000 g | Freq: Once | ORAL | Status: AC
  Administered 2014-07-21: 1 g via ORAL
  Filled 2014-07-21: qty 10

## 2014-07-21 MED ORDER — SUCRALFATE 1 GM/10ML PO SUSP
1.0000 g | Freq: Three times a day (TID) | ORAL | Status: DC | PRN
Start: 2014-07-21 — End: 2014-07-29
  Administered 2014-07-21 – 2014-07-23 (×3): 1 g via ORAL
  Filled 2014-07-21 (×4): qty 10

## 2014-07-21 MED ORDER — DIAZEPAM 5 MG/ML IJ SOLN
1.0000 mg | Freq: Once | INTRAMUSCULAR | Status: AC
  Administered 2014-07-21: 1 mg via INTRAVENOUS
  Filled 2014-07-21 (×2): qty 2

## 2014-07-21 NOTE — Progress Notes (Signed)
2 Days Post-Op Procedure(s) (LRB): ATTEMPTED LAPAROSCOPIC ASSISTED VAGINAL HYSTERECTOMY,   HYSTERECTOMY ABDOMINAL Bilateral oopherectomy and RS SMALL BOWEL RESECTION (N/A)  Subjective: Patient reports gas pains. She is ambulating and voiding.  Objective: I have reviewed patient's vital signs, intake and output, medications and labs.  General: alert Resp: clear to auscultation bilaterally Cardio: regular rate and rhythm, S1, S2 normal, no murmur, click, rub or gallop GI: decreased bowel sounds, no tympany  Assessment: s/p Procedure(s): ATTEMPTED LAPAROSCOPIC ASSISTED VAGINAL HYSTERECTOMY,  (Bilateral) HYSTERECTOMY ABDOMINAL with bilateral salpingectomy (Bilateral) SMALL BOWEL RESECTION (N/A): stable  Plan: Encourage ambulation  I may transfer her to the Women's unit if they have enough nurses later this afternoon. We are awaiting flatus. I will remove NGT and drain at that time.  LOS: 2 days    Christinea Brizuela C. 07/21/2014, 11:53 AM
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
## 2014-07-21 NOTE — Progress Notes (Signed)
General Surgery Note  LOS: 2 days  POD -  2 Days Post-Op  Assessment/Plan: 1.  ATTEMPTED LAPAROSCOPIC ASSISTED VAGINAL HYSTERECTOMY,  HYSTERECTOMY ABDOMINAL with bilateral salpingectomy,  SMALL BOWEL RESECTION - 07/19/2014 - [facility]  WBC - 14,400 - 07/21/2014  Still sort of miserable - no bowel function  Cont NGT for now.   2.  History of asthma 3.  History of bipolar disease 4.  HTN 5.  DVT prophylaxis - will start SQ Heparin 6.  Anemia - Hgb - 9.0 - 07/21/2104 7.  Morbid obesity 8.  Prediabetes   Principal Problem:   Post-operative state Active Problems:   Chest pain   Pain in the chest  Subjective:  Burping, but no flatus or BM.  Has abdominal pain. Objective:   Filed Vitals:   07/21/14 1905  BP: 160/86  Pulse:   Temp:   Resp: 23     Intake/Output from previous day:  12/23 0701 - 12/24 0700 In: 3400 [I.V.:3000; IV Piggyback:400] Out: [id number] [Urine:3575; Drains:160]  Intake/Output this shift:      Physical Exam:   General: Obese AAF who is alert and oriented.    HEENT: Normal. Pupils equal. .   Lungs: poor inspiratory effort.  She pulls about 800 cc on IS.   Abdomen: Obese, no BS.   Wound: Clean.  Drain in RLQ - 70 cc reported so far today     Lab Results:    Recent Labs  07/20/14 0614 07/21/14 0540  WBC 13.2* 14.4*  HGB 10.0* 9.0*  HCT 29.7* 26.8*  PLT 247 197    BMET   Recent Labs  07/20/14 0615  NA 136  K 4.4  CL 104  CO2 26  GLUCOSE 126*  BUN 11  CREATININE 0.95  CALCIUM 7.8*    PT/INR  No results for input(s): LABPROT, INR in the last 72 hours.  ABG  No results for input(s): PHART, HCO3 in the last 72 hours.  Invalid input(s): PCO2, PO2   Studies/Results:  No results found.   Anti-infectives:   Anti-infectives    Start     Dose/Rate Route Frequency Ordered Stop   07/19/14 0929  ceFAZolin (ANCEF) 2-3 GM-% IVPB SOLR    CommentsRanda Evens  : cabinet override      07/19/14 0929 07/19/14 1435      Alphonsa Overall, MD, FACS Pager: [facility] Office: [phone] 07/21/2014

## 2014-07-21 NOTE — Progress Notes (Signed)
   07/21/14 0900  Clinical Encounter Type  Visited With Patient  Visit Type Spiritual support;Social support  Referral From Chaplain ([address] [place], [place])  Spiritual Encounters  Spiritual Needs Emotional;Prayer   Tonette was feeling terrible physically on this follow-up visit.  She verbalized deep appreciation for Chaplain Katy Claussen's calm, centering support yesterday.  Provided pastoral presence, witness to her story and pain, encouragement, and prayer per request.  Suzette Battiest, [place]

## 2014-07-21 NOTE — Progress Notes (Signed)
Echo appears normal - can follow-up with me in the office. Cardiology will sign-off.   Pixie Casino, MD, [facility] Attending Cardiologist [place]

## 2014-07-22 ENCOUNTER — Inpatient Hospital Stay (HOSPITAL_COMMUNITY): Payer: Self-pay

## 2014-07-22 ENCOUNTER — Encounter (HOSPITAL_COMMUNITY): Payer: Self-pay | Admitting: Obstetrics & Gynecology

## 2014-07-22 DIAGNOSIS — Z9049 Acquired absence of other specified parts of digestive tract: Secondary | ICD-10-CM

## 2014-07-22 HISTORY — DX: Acquired absence of other specified parts of digestive tract: Z90.49

## 2014-07-22 LAB — CBC
HCT: 28.9 % — ABNORMAL LOW (ref 36.0–46.0)
HEMOGLOBIN: 9.5 g/dL — AB (ref 12.0–15.0)
MCH: 25.7 pg — AB (ref 26.0–34.0)
MCHC: 32.9 g/dL (ref 30.0–36.0)
MCV: 78.1 fL (ref 78.0–100.0)
PLATELETS: 262 10*3/uL (ref 150–400)
RBC: 3.7 MIL/uL — AB (ref 3.87–5.11)
RDW: 18.9 % — ABNORMAL HIGH (ref 11.5–15.5)
WBC: 18.3 10*3/uL — ABNORMAL HIGH (ref 4.0–10.5)

## 2014-07-22 MED ORDER — METOPROLOL TARTRATE 1 MG/ML IV SOLN
5.0000 mg | Freq: Four times a day (QID) | INTRAVENOUS | Status: DC | PRN
  Filled 2014-07-22: qty 5

## 2014-07-22 MED ORDER — BECLOMETHASONE DIPROPIONATE 40 MCG/ACT IN AERS
2.0000 | INHALATION_SPRAY | Freq: Two times a day (BID) | RESPIRATORY_TRACT | Status: DC
  Administered 2014-07-22 – 2014-07-29 (×13): 2 via RESPIRATORY_TRACT
  Filled 2014-07-22: qty 8.7

## 2014-07-22 MED ORDER — LIP MEDEX EX OINT
1.0000 "application " | TOPICAL_OINTMENT | Freq: Two times a day (BID) | CUTANEOUS | Status: DC
  Administered 2014-07-22 – 2014-07-28 (×13): 1 via TOPICAL
  Filled 2014-07-22: qty 7

## 2014-07-22 MED ORDER — PIPERACILLIN-TAZOBACTAM 3.375 G IVPB
3.3750 g | Freq: Three times a day (TID) | INTRAVENOUS | Status: DC
  Administered 2014-07-22 – 2014-07-26 (×12): 3.375 g via INTRAVENOUS
  Filled 2014-07-22 (×14): qty 50

## 2014-07-22 MED ORDER — SUMATRIPTAN SUCCINATE 50 MG PO TABS
50.0000 mg | ORAL_TABLET | ORAL | Status: DC | PRN
  Administered 2014-07-26: 50 mg via ORAL
  Filled 2014-07-22 (×2): qty 1

## 2014-07-22 MED ORDER — LACTATED RINGERS IV BOLUS (SEPSIS): 1000.0000 mL | Freq: Three times a day (TID) | INTRAVENOUS | Status: AC | PRN

## 2014-07-22 MED ORDER — LACTATED RINGERS IV BOLUS (SEPSIS)
500.0000 mL | Freq: Once | INTRAVENOUS | Status: AC
  Administered 2014-07-22: 500 mL via INTRAVENOUS

## 2014-07-22 MED ORDER — BISACODYL 10 MG RE SUPP
10.0000 mg | Freq: Two times a day (BID) | RECTAL | Status: DC | PRN
  Administered 2014-07-24 – 2014-07-28 (×2): 10 mg via RECTAL
  Filled 2014-07-22 (×2): qty 1

## 2014-07-22 MED ORDER — DIAZEPAM 5 MG/ML IJ SOLN
1.0000 mg | Freq: Every day | INTRAMUSCULAR | Status: DC | PRN
  Administered 2014-07-22: 1 mg via INTRAVENOUS
  Filled 2014-07-22 (×2): qty 2

## 2014-07-22 MED ORDER — METOPROLOL TARTRATE 1 MG/ML IV SOLN
5.0000 mg | Freq: Four times a day (QID) | INTRAVENOUS | Status: DC
  Administered 2014-07-22: 5 mg via INTRAVENOUS
  Filled 2014-07-22 (×5): qty 5

## 2014-07-22 MED ORDER — MENTHOL 3 MG MT LOZG
1.0000 | LOZENGE | OROMUCOSAL | Status: DC | PRN
  Filled 2014-07-22: qty 9

## 2014-07-22 MED ORDER — ACETAMINOPHEN 650 MG RE SUPP
650.0000 mg | Freq: Four times a day (QID) | RECTAL | Status: DC | PRN
  Administered 2014-07-23: 650 mg via RECTAL
  Filled 2014-07-22: qty 1

## 2014-07-22 MED ORDER — LORAZEPAM 2 MG/ML IJ SOLN
0.5000 mg | Freq: Three times a day (TID) | INTRAMUSCULAR | Status: DC | PRN
  Administered 2014-07-23: 0.5 mg via INTRAVENOUS
  Filled 2014-07-22 (×2): qty 0.5

## 2014-07-22 MED ORDER — PROMETHAZINE HCL 25 MG/ML IJ SOLN
12.5000 mg | INTRAMUSCULAR | Status: DC | PRN
  Administered 2014-07-22 – 2014-07-27 (×2): 12.5 mg via INTRAVENOUS
  Filled 2014-07-22 (×2): qty 1

## 2014-07-22 MED ORDER — PANTOPRAZOLE SODIUM 40 MG PO TBEC: 40.0000 mg | DELAYED_RELEASE_TABLET | Freq: Every day | ORAL | Status: DC

## 2014-07-22 MED ORDER — MAGIC MOUTHWASH
15.0000 mL | Freq: Four times a day (QID) | ORAL | Status: DC | PRN
  Administered 2014-07-22: 15 mL via ORAL
  Filled 2014-07-22 (×2): qty 15

## 2014-07-22 MED ORDER — PANTOPRAZOLE SODIUM 40 MG IV SOLR
40.0000 mg | Freq: Two times a day (BID) | INTRAVENOUS | Status: DC
  Administered 2014-07-22 – 2014-07-23 (×4): 40 mg via INTRAVENOUS
  Filled 2014-07-22 (×5): qty 40

## 2014-07-22 MED ORDER — PROMETHAZINE HCL 25 MG/ML IJ SOLN: 6.2500 mg | INTRAMUSCULAR | Status: DC | PRN

## 2014-07-22 MED ORDER — ALUM & MAG HYDROXIDE-SIMETH 200-200-20 MG/5ML PO SUSP
30.0000 mL | Freq: Four times a day (QID) | ORAL | Status: DC | PRN
  Administered 2014-07-27 – 2014-07-28 (×2): 30 mL via ORAL
  Filled 2014-07-22 (×2): qty 30

## 2014-07-22 MED ORDER — METHOCARBAMOL 1000 MG/10ML IJ SOLN
1000.0000 mg | Freq: Four times a day (QID) | INTRAVENOUS | Status: DC | PRN
  Filled 2014-07-22: qty 10

## 2014-07-22 MED ORDER — HYDROMORPHONE HCL 1 MG/ML IJ SOLN
0.5000 mg | INTRAMUSCULAR | Status: DC | PRN
  Administered 2014-07-22 – 2014-07-23 (×10): 0.5 mg via INTRAVENOUS
  Administered 2014-07-24: 1 mg via INTRAVENOUS
  Administered 2014-07-24: 0.5 mg via INTRAVENOUS
  Administered 2014-07-24 (×2): 1 mg via INTRAVENOUS
  Filled 2014-07-22 (×14): qty 1
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
## 2014-07-22 NOTE — Progress Notes (Signed)
3 Days Post-Op Procedure(s) (LRB): ATTEMPTED LAPAROSCOPIC ASSISTED VAGINAL HYSTERECTOMY,  HYSTERECTOMY ABDOMINAL withrightsalpingectomy, bilateral ooperectomy SMALL BOWEL RESECTION (N/A)  Subjective: Patient reports no problems voiding.  She is trying to use her incentive spirometer more often. She would very much like to have her Nexium instead of the protonix. She has no flatus. She is ambulating and sitting in a chair.  Objective: I have reviewed patient's vital signs, intake and output, medications and labs.  General: alert Resp: clear to auscultation bilaterally Cardio: regular rate and rhythm, S1, S2 normal, no murmur, click, rub or gallop GI: hypoactive bowel sounds, non-distended Extremities: Homans sign is negative, no sign of DVT  Assessment: s/p Procedure(s): ATTEMPTED LAPAROSCOPIC ASSISTED VAGINAL HYSTERECTOMY,  (Bilateral) HYSTERECTOMY ABDOMINAL with bilateral salpingectomy (Bilateral) SMALL BOWEL RESECTION (N/A): stable Her JP drainage has significantly increased and changed appearance. It was previously bloody and now is mostly serous/clear with a bloody tint. 24 hour volume has gone from 100 cc in the first 24 hours to 400+. I will speak with gen surg about this.  Plan: Encourage ambulation    LOS: 3 days    Connery Shiffler C. 07/22/2014, 9:01 AM

## 2014-07-22 NOTE — Progress Notes (Signed)
[facility]  [address] [place], [place] 999-26-5244 Phone: [phone] FAX: [phone]    Patricia [name] VN:[id number] [birth year]  CARE TEAM:  PCP: No PCP Per Patient  Outpatient Care Team: Patient Care Team: No Pcp Per Patient as PCP - General (General Practice)  Inpatient Treatment Team: Treatment Team: Attending Provider: Emily Filbert, MD; Consulting Physician: Nolon Nations, MD; Registered Nurse: Verdis Prime, RN; Chaplain: Deboraha Sprang; Registered Nurse: Marty Heck, RN; Registered Nurse: Maximino Greenland, RN   Subjective:  Sore but getting up more Some hiccups/abd cramping Hates NGT Doesn't like blood sticks Doesn't like heparin shots RN & Dr Hulan Fray in room Appreciates care given  Objective:  Vital signs:  Filed Vitals:   07/22/14 0600 07/22/14 0700 07/22/14 0800 07/22/14 0810  BP:   144/91   Pulse:      Temp: 98.9 F (37.2 C)  98.9 F (37.2 C)   TempSrc: Oral  Oral   Resp:  26 22 24   Height:      Weight: 249 lb 8 oz (113.172 kg)     SpO2:    95%    Last BM Date: 07/18/14  Intake/Output   Yesterday:  12/24 0701 - 12/25 0700 In: 3000 [I.V.:3000] Out: 2625 [Urine:2050; Emesis/NG output:250; Drains:325] This shift:  Total I/O In: 250 [I.V.:250] Out: 150 [Emesis/NG output:50; Drains:100]  Bowel function:  Flatus: n  BM: n  Drain: serossanguinous  Physical Exam:  General: Pt awake/alert/oriented x4 in no acute distress Eyes: PERRL, normal EOM.  Sclera clear.  No icterus Neuro: CN II-XII intact w/o focal sensory/motor deficits. Lymph: No head/neck/groin lymphadenopathy Psych:  No delerium/psychosis/paranoia.  Anxious & emotional but consolable HENT: Normocephalic, Mucus membranes moist.  No thrush Neck: Supple, No tracheal deviation Chest:  No chest wall pain to palpation w OK excursion CV:  Pulses intact.  Regular rhythm MS: Normal AROM mjr joints.  No obvious  deformity Abdomen: Soft.  Morbidly obese.  Nondistended.  Mildly tender at incisions only.  No evidence of peritonitis.  No incarcerated hernias. Ext:  SCDs BLE.  No mjr edema.  No cyanosis Skin: No petechiae / purpura   Problem List:   Principal Problem:   Menorrhagia s/p abdominal hysterectomy 07/19/2014 Active Problems:   Obstructive sleep apnea   Post-operative state   Pain in the chest   Morbid obesity   Status post small bowel resection 07/19/2014   Assessment  Tinea W Elza  43 y.o. female  3 Days Post-Op  Procedure(s):  OPEN LOA HYSTERECTOMY ABDOMINAL with bilateral salpingectomy SMALL BOWEL RESECTION  OK with postop ileus sp extensive LOA & SB resection  Plan:  -cont NGT until flautus - then clamping trial -IVF -improve pain control -anxiolysis.  Follow MS with h/o bipolar dz -chest pain perhaps GERD - inc PPI -metoprolol for HTN - give IV -anemia - post op follow -give meds IV - cannot trust PO meds to work -VTE prophylaxis- SCDs, heparin given poor mobility & obesity & pelvic surgery -mobilize as tolerated to help recovery - GET HER UP!  Adin Hector, M.D., F.A.C.S. Gastrointestinal and Minimally Invasive Surgery [facility] [address] [place], [place] [address] [phone] Main / Paging   07/22/2014   Results:   Labs: Results for orders placed or performed during the hospital encounter of 07/19/14 (from the past 48 hour(s))  CBC     Status: Abnormal   Collection Time: 07/21/14  5:40 AM  Result Value Ref Range   WBC 14.4 (H) 4.0 - 10.5 K/uL   RBC 3.41 (L) 3.87 - 5.11 MIL/uL   Hemoglobin 9.0 (L) 12.0 - 15.0 g/dL   HCT 26.8 (L) 36.0 - 46.0 %   MCV 78.6 78.0 - 100.0 fL   MCH 26.4 26.0 - 34.0 pg   MCHC 33.6 30.0 - 36.0 g/dL   RDW 18.6 (H) 11.5 - 15.5 %   Platelets 197 150 - 400 K/uL    Imaging / Studies: No results found.  Medications / Allergies: per chart  Antibiotics: Anti-infectives     Start     Dose/Rate Route Frequency Ordered Stop   07/19/14 0929  ceFAZolin (ANCEF) 2-3 GM-% IVPB SOLR    Comments:  Harvell, Gwendolyn  : cabinet override      07/19/14 0929 07/19/14 1435       Note: Portions of this report may have been transcribed using voice recognition software. Every effort was made to ensure accuracy; however, inadvertent computerized transcription errors may be present.   Any transcriptional errors that result from this process are unintentional.

## 2014-07-23 LAB — BASIC METABOLIC PANEL
ANION GAP: 9 (ref 5–15)
BUN: 7 mg/dL (ref 6–23)
CALCIUM: 8.1 mg/dL — AB (ref 8.4–10.5)
CHLORIDE: 103 meq/L (ref 96–112)
CO2: 25 mmol/L (ref 19–32)
CREATININE: 0.74 mg/dL (ref 0.50–1.10)
GFR calc Af Amer: >90 mL/min (ref >90)
GFR calc non Af Amer: >90 mL/min (ref >90)
Glucose, Bld: 111 mg/dL — ABNORMAL HIGH (ref 70–99)
Potassium: 3.4 mmol/L — ABNORMAL LOW (ref 3.5–5.1)
Sodium: 137 mmol/L (ref 135–145)

## 2014-07-23 LAB — CBC
HCT: 29.1 % — ABNORMAL LOW (ref 36.0–46.0)
Hemoglobin: 9.5 g/dL — ABNORMAL LOW (ref 12.0–15.0)
MCH: 25.6 pg — ABNORMAL LOW (ref 26.0–34.0)
MCHC: 32.6 g/dL (ref 30.0–36.0)
MCV: 78.4 fL (ref 78.0–100.0)
PLATELETS: 309 10*3/uL (ref 150–400)
RBC: 3.71 MIL/uL — AB (ref 3.87–5.11)
RDW: 18.7 % — ABNORMAL HIGH (ref 11.5–15.5)
WBC: 16.7 10*3/uL — AB (ref 4.0–10.5)

## 2014-07-23 MED ORDER — FENTANYL 50 MCG/HR TD PT72
50.0000 ug | MEDICATED_PATCH | TRANSDERMAL | Status: DC
  Administered 2014-07-23 – 2014-07-29 (×3): 50 ug via TRANSDERMAL
  Filled 2014-07-23 (×2): qty 1

## 2014-07-23 MED ORDER — METOPROLOL TARTRATE 1 MG/ML IV SOLN
2.5000 mg | Freq: Four times a day (QID) | INTRAVENOUS | Status: DC
  Filled 2014-07-23 (×5): qty 5

## 2014-07-23 MED ORDER — MAGIC MOUTHWASH
15.0000 mL | Freq: Four times a day (QID) | ORAL | Status: AC
Start: 2014-07-23 — End: 2014-07-26
  Administered 2014-07-23 – 2014-07-24 (×8): 15 mL via ORAL
  Filled 2014-07-23 (×12): qty 15

## 2014-07-23 MED ORDER — ATENOLOL 50 MG PO TABS
50.0000 mg | ORAL_TABLET | Freq: Every day | ORAL | Status: DC
  Administered 2014-07-23 – 2014-07-24 (×2): 50 mg via ORAL
  Filled 2014-07-23 (×3): qty 1

## 2014-07-23 MED ORDER — QUETIAPINE FUMARATE ER 400 MG PO TB24
400.0000 mg | ORAL_TABLET | Freq: Every day | ORAL | Status: DC
  Administered 2014-07-23 – 2014-07-25 (×3): 400 mg via ORAL
  Filled 2014-07-23 (×3): qty 1

## 2014-07-23 MED ORDER — ATENOLOL 50 MG PO TABS
50.0000 mg | ORAL_TABLET | Freq: Every day | ORAL | Status: DC
  Filled 2014-07-23: qty 1

## 2014-07-23 MED ORDER — DIAZEPAM 5 MG/ML IJ SOLN: 1.0000 mg | Freq: Every evening | INTRAMUSCULAR | Status: DC | PRN

## 2014-07-23 NOTE — Progress Notes (Addendum)
[facility]  [address] [place], [place] [address] Phone: [phone] FAX: [phone]    ELESE RANE [id number] 1970/11/21  CARE TEAM:  PCP: No PCP Per Patient  Outpatient Care Team: Patient Care Team: No Pcp Per Patient as PCP - General (General Practice)  Inpatient Treatment Team: Treatment Team: Attending Provider: Emily Filbert, MD; Consulting Physician: Nolon Nations, MD; Registered Nurse: Verdis Prime, RN; Chaplain: Deboraha Sprang; Registered Nurse: Marty Heck, RN; Registered Nurse: Jeannetta Ellis, RN; Registered Nurse: Beverlee Nims, RN   Subjective:  Pain better controlled Getting up more Transferred out of SDU to floor Appreciates care given  Objective:  Vital signs:  Filed Vitals:   07/23/14 0300 07/23/14 0400 07/23/14 0606 07/23/14 0800  BP: 141/97  130/71 142/86  Pulse: 96  104 108  Temp:  98.5 F (36.9 C)  98.3 F (36.8 C)  TempSrc:  Oral  Oral  Resp: '20  20 24  ' Height:      Weight:      SpO2: 94% 98% 97% 99%    Last BM Date: 07/18/14  Intake/Output   Yesterday:  12/25 0701 - 12/26 0700 In: 2879.2 [I.V.:2329.2; IV Piggyback:550] Out: 2160 [Urine:925; Emesis/NG output:400; Drains:835] This shift:  Total I/O In: -  Out: 180 [Urine:150; Drains:30]  Bowel function:  Flatus: n  BM: n  Drain: serous  Physical Exam:  General: Pt awake/alert/oriented x4 in no acute distress Eyes: PERRL, normal EOM.  Sclera clear.  No icterus Neuro: CN II-XII intact w/o focal sensory/motor deficits. Lymph: No head/neck/groin lymphadenopathy Psych:  No delerium/psychosis/paranoia.  Less anxious  HENT: Normocephalic, Mucus membranes moist.  No thrush Neck: Supple, No tracheal deviation Chest:  No chest wall pain to palpation w OK excursion CV:  Pulses intact.  Regular rhythm MS: Normal AROM mjr joints.  No obvious deformity Abdomen: Soft.  Morbidly obese.  Nondistended.  Mildly tender  at incision only.  No evidence of peritonitis.  No incarcerated hernias.  Dressing removed - no cellulitis/purulence Ext:  SCDs BLE.  No mjr edema.  No cyanosis Skin: No petechiae / purpura   Problem List:   Principal Problem:   Menorrhagia s/p abdominal hysterectomy 07/19/2014 Active Problems:   Obstructive sleep apnea   Post-operative state   Pain in the chest   Morbid obesity   Status post small bowel resection 07/19/2014   Assessment  Tais W Griesinger  43 y.o. female  4 Days Post-Op  Procedure(s):  OPEN LOA HYSTERECTOMY ABDOMINAL with bilateral salpingectomy SMALL BOWEL RESECTION  Slowly improving with postop ileus sp extensive LOA & SB resection  Plan:  -cont NGT until flatus - then clamping trial  -give meds IV - cannot trust PO meds to work  -IVF 50/hr, 1/2 maintenance.  Let her naturally diurese but follow UOP & Cr  -drain with mod volume but serous - suspect ascites & acting as peritoneal dialysis catheter - d/c drain.  -If worsening WBC or pain or no resolution of ileus by Monday, consider CT scan abd/pelvis to r/o abscess.    -maintain pain control  -palliate NGT sore throat  -anxiolysis.  Follow MS with h/o bipolar dz  -chest pain less.  perhaps GERD - cont PPI  -metoprolol for HTN - give IV.  Cut dose in 1/2  -anemia - post op follow  -VTE prophylaxis- SCDs, heparin given poor mobility & obesity & pelvic surgery  -mobilize as tolerated to help recovery - GET HER  UP!  Adin Hector, M.D., F.A.C.S. Gastrointestinal and Minimally Invasive Surgery [facility] [address], [place],  [address] [phone] Main / Paging   07/23/2014   Results:   Labs: Results for orders placed or performed during the hospital encounter of 07/19/14 (from the past 48 hour(s))  CBC     Status: Abnormal   Collection Time: 07/22/14  8:48 AM  Result Value Ref Range   WBC 18.3 (H) 4.0 - 10.5 K/uL   RBC 3.70 (L)  3.87 - 5.11 MIL/uL   Hemoglobin 9.5 (L) 12.0 - 15.0 g/dL   HCT 28.9 (L) 36.0 - 46.0 %   MCV 78.1 78.0 - 100.0 fL   MCH 25.7 (L) 26.0 - 34.0 pg   MCHC 32.9 30.0 - 36.0 g/dL   RDW 18.9 (H) 11.5 - 15.5 %   Platelets 262 150 - 400 K/uL    Comment: REPEATED TO VERIFY DELTA CHECK NOTED   CBC     Status: Abnormal   Collection Time: 07/23/14  5:14 AM  Result Value Ref Range   WBC 16.7 (H) 4.0 - 10.5 K/uL   RBC 3.71 (L) 3.87 - 5.11 MIL/uL   Hemoglobin 9.5 (L) 12.0 - 15.0 g/dL   HCT 29.1 (L) 36.0 - 46.0 %   MCV 78.4 78.0 - 100.0 fL   MCH 25.6 (L) 26.0 - 34.0 pg   MCHC 32.6 30.0 - 36.0 g/dL   RDW 18.7 (H) 11.5 - 15.5 %   Platelets 309 150 - 400 K/uL  Basic metabolic panel     Status: Abnormal   Collection Time: 07/23/14  5:14 AM  Result Value Ref Range   Sodium 137 135 - 145 mmol/L    Comment: Please note change in reference range.   Potassium 3.4 (L) 3.5 - 5.1 mmol/L    Comment: Please note change in reference range. DELTA CHECK NOTED REPEATED TO VERIFY NO VISIBLE HEMOLYSIS    Chloride 103 96 - 112 mEq/L   CO2 25 19 - 32 mmol/L   Glucose, Bld 111 (H) 70 - 99 mg/dL   BUN 7 6 - 23 mg/dL   Creatinine, Ser 0.74 0.50 - 1.10 mg/dL   Calcium 8.1 (L) 8.4 - 10.5 mg/dL   GFR calc non Af Amer >90 >90 mL/min   GFR calc Af Amer >90 >90 mL/min    Comment: (NOTE) The eGFR has been calculated using the CKD EPI equation. This calculation has not been validated in all clinical situations. eGFR's persistently <90 mL/min signify possible Chronic Kidney Disease.    Anion gap 9 5 - 15    Imaging / Studies: Dg Chest 2 View  07/22/2014   CLINICAL DATA:  Fever and cough for 1 day.  Initial encounter.  EXAM: CHEST  2 VIEW  COMPARISON:  Chest radiograph performed 07/02/2014  FINDINGS: The lungs are well-aerated. Mild focal left mid lung opacity could reflect mild pneumonia. There is no evidence of pleural effusion or pneumothorax.  The heart is normal in size; the mediastinal contour is within  normal limits. An enteric tube is seen ending overlying the body of the stomach. No acute osseous abnormalities are seen.  IMPRESSION: Mild focal left midlung opacity could reflect mild pneumonia.   Electronically Signed   By: Garald Balding M.D.   On: 07/22/2014 23:03    Medications / Allergies: per chart  Antibiotics: Anti-infectives    Start     Dose/Rate Route Frequency Ordered Stop  07/22/14 2100  piperacillin-tazobactam (ZOSYN) IVPB 3.375 g     3.375 g12.5 mL/hr over 240 Minutes Intravenous Every 8 hours 07/22/14 2058     07/19/14 0929  ceFAZolin (ANCEF) 2-3 GM-% IVPB SOLR    Comments:  Harvell, Gwendolyn  : cabinet override      07/19/14 0929 07/19/14 1435       Note: Portions of this report may have been transcribed using voice recognition software. Every effort was made to ensure accuracy; however, inadvertent computerized transcription errors may be present.   Any transcriptional errors that result from this process are unintentional.

## 2014-07-23 NOTE — Progress Notes (Signed)
Dr. Lucia Gaskins notified of patient passing flatus.  Order received to remove NG tube.  Patient tolerated well.

## 2014-07-23 NOTE — Progress Notes (Signed)
Witness 1.8 mg of Valium wasted in sink.  Ivin Poot, RN

## 2014-07-23 NOTE — Progress Notes (Signed)
Wasted 1.62mL of valium in sink with Ivin Poot, RN

## 2014-07-23 NOTE — Progress Notes (Signed)
4 Days Post-Op Procedure(s) (LRB): ATTEMPTED LAPAROSCOPIC ASSISTED VAGINAL HYSTERECTOMY,  HYSTERECTOMY ABDOMINAL withrightsalpingectomy, bilateral ooperectomy SMALL BOWEL RESECTION (N/A)  Subjective: Patient reports no problems voiding. She is walking more than in previous days. She is having a greenish productive cough. No flatus.  Objective: I have reviewed patient's vital signs, intake and output, medications and labs.  General: alert Resp: clear to auscultation bilaterally Cardio: regular rate and rhythm, S1, S2 normal, no murmur, click, rub or gallop GI: hypoactive bowel sounds  Her JP continues to put out a large amount of serous fluid. Assessment: POD #4 Probable left LL pneumonia    Plan: Encourage ambulation transfer to Women's unit today  Await bowel function Continue Zosyn for probable pneumonia Encourage IS  LOS: 4 days    Patricia [name] C. 07/23/2014, 6:28 AM

## 2014-07-24 ENCOUNTER — Encounter (HOSPITAL_COMMUNITY): Payer: Self-pay

## 2014-07-24 LAB — BASIC METABOLIC PANEL
ANION GAP: 10 (ref 5–15)
BUN: 7 mg/dL (ref 6–23)
CALCIUM: 8.1 mg/dL — AB (ref 8.4–10.5)
CO2: 24 mmol/L (ref 19–32)
Chloride: 103 mEq/L (ref 96–112)
Creatinine, Ser: 0.67 mg/dL (ref 0.50–1.10)
GFR calc non Af Amer: >90 mL/min (ref >90)
Glucose, Bld: 82 mg/dL (ref 70–99)
POTASSIUM: 3.3 mmol/L — AB (ref 3.5–5.1)
Sodium: 137 mmol/L (ref 135–145)

## 2014-07-24 LAB — CBC
HCT: 29.1 % — ABNORMAL LOW (ref 36.0–46.0)
Hemoglobin: 9.6 g/dL — ABNORMAL LOW (ref 12.0–15.0)
MCH: 25.8 pg — AB (ref 26.0–34.0)
MCHC: 33 g/dL (ref 30.0–36.0)
MCV: 78.2 fL (ref 78.0–100.0)
PLATELETS: 304 10*3/uL (ref 150–400)
RBC: 3.72 MIL/uL — ABNORMAL LOW (ref 3.87–5.11)
RDW: 18.5 % — ABNORMAL HIGH (ref 11.5–15.5)
WBC: 11.8 10*3/uL — ABNORMAL HIGH (ref 4.0–10.5)

## 2014-07-24 MED ORDER — ALBUTEROL SULFATE (2.5 MG/3ML) 0.083% IN NEBU
2.5000 mg | INHALATION_SOLUTION | Freq: Four times a day (QID) | RESPIRATORY_TRACT | Status: DC | PRN
  Administered 2014-07-24 – 2014-07-26 (×2): 2.5 mg via RESPIRATORY_TRACT
  Filled 2014-07-24: qty 3

## 2014-07-24 MED ORDER — BISACODYL 10 MG RE SUPP: 10.0000 mg | Freq: Every day | RECTAL | Status: DC | PRN

## 2014-07-24 MED ORDER — OXYCODONE-ACETAMINOPHEN 5-325 MG PO TABS
1.0000 | ORAL_TABLET | ORAL | Status: DC | PRN
Start: 2014-07-24 — End: 2014-07-26
  Administered 2014-07-24 – 2014-07-26 (×8): 2 via ORAL
  Filled 2014-07-24 (×9): qty 2

## 2014-07-24 MED ORDER — SACCHAROMYCES BOULARDII 250 MG PO CAPS
250.0000 mg | ORAL_CAPSULE | Freq: Two times a day (BID) | ORAL | Status: DC
  Administered 2014-07-24 – 2014-07-29 (×11): 250 mg via ORAL
  Filled 2014-07-24 (×11): qty 1

## 2014-07-24 MED ORDER — PANTOPRAZOLE SODIUM 40 MG PO TBEC
40.0000 mg | DELAYED_RELEASE_TABLET | Freq: Two times a day (BID) | ORAL | Status: DC
  Administered 2014-07-24 – 2014-07-29 (×11): 40 mg via ORAL
  Filled 2014-07-24 (×11): qty 1

## 2014-07-24 MED ORDER — DIPHENHYDRAMINE HCL 25 MG PO CAPS
25.0000 mg | ORAL_CAPSULE | ORAL | Status: DC | PRN
  Administered 2014-07-24 – 2014-07-28 (×12): 25 mg via ORAL
  Filled 2014-07-24 (×13): qty 1

## 2014-07-24 MED ORDER — DIPHENHYDRAMINE HCL 25 MG PO CAPS
25.0000 mg | ORAL_CAPSULE | Freq: Four times a day (QID) | ORAL | Status: DC | PRN
  Administered 2014-07-24: 25 mg via ORAL
  Filled 2014-07-24: qty 1

## 2014-07-24 MED ORDER — DIAZEPAM 5 MG PO TABS: 5.0000 mg | ORAL_TABLET | Freq: Every evening | ORAL | Status: DC | PRN

## 2014-07-24 NOTE — Progress Notes (Signed)
[facility]  [address] [place], [place] [address] Phone: [phone] FAX: [phone]    Patricia [name] [id number] Dec 15, 1970  CARE TEAM:  PCP: No PCP Per Patient  Outpatient Care Team: Patient Care Team: No Pcp Per Patient as PCP - General (General Practice)  Inpatient Treatment Team: Treatment Team: Attending Provider: Emily Filbert, MD; Consulting Physician: Nolon Nations, MD; Chaplain: Benard Halsted, Chaplain; Registered Nurse: Gerald Leitz, RN; Registered Nurse: Merri Ray Caudle, RN   Subjective:  Felt flatus yest - NGT removed.  No flatus since.  No nausea/vomiting Crampy abd pain but controlled Getting up  No events on floor Appreciates care given  Objective:  Vital signs:  Filed Vitals:   07/23/14 1738 07/23/14 2058 07/24/14 0210 07/24/14 0535  BP: 115/79 142/89 129/70 121/74  Pulse: 94 93 90 106  Temp: 98.7 F (37.1 C) 98.3 F (36.8 C) 98 F (36.7 C) 98.1 F (36.7 C)  TempSrc: Oral Oral Oral   Resp: 20     Height:      Weight:      SpO2:  98% 95% 100%    Last BM Date: 07/18/14  Intake/Output   Yesterday:  12/26 0701 - 12/27 0700 In: 1608.8 [P.O.:40; I.V.:1388.8; IV Piggyback:150] Out: 1660 [Urine:1425; Emesis/NG output:100; Drains:110] This shift:     Bowel function:  Flatus: ONCE  BM: n  Drain: out  Physical Exam:  General: Pt awake/alert/oriented x4 in no acute distress Eyes: PERRL, normal EOM.  Sclera clear.  No icterus Neuro: CN II-XII intact w/o focal sensory/motor deficits. Lymph: No head/neck/groin lymphadenopathy Psych:  No delerium/psychosis/paranoia.  Smiling.  Less anxious  HENT: Normocephalic, Mucus membranes moist.  No thrush Neck: Supple, No tracheal deviation Chest:  No chest wall pain to palpation w OK excursion CV:  Pulses intact.  Regular rhythm MS: Normal AROM mjr joints.  No obvious deformity Abdomen: Soft.  Morbidly obese.  Nondistended.  Mildly  tender at incision only.  No evidence of peritonitis.  No incarcerated hernias.  Dressing removed - no cellulitis/purulence Ext:  SCDs BLE.  No mjr edema.  No cyanosis Skin: No petechiae / purpura   Problem List:   Principal Problem:   Menorrhagia s/p abdominal hysterectomy 07/19/2014 Active Problems:   Obstructive sleep apnea   Post-operative state   Pain in the chest   Morbid obesity   Status post small bowel resection 07/19/2014   Assessment  Patricia [name]  43 y.o. female  5 Days Post-Op  Procedure(s):  OPEN LOA HYSTERECTOMY ABDOMINAL with bilateral salpingectomy SMALL BOWEL RESECTION  Slowly improving with postop ileus sp extensive LOA & SB resection  Plan:  -try clears/thin liquids only today.  If tolerates, adv tomorrow  -minimize IVF.  Let her naturally diurese but follow UOP & Cr  -If worsening WBC or pain or no resolution of ileus by Monday, consider CT scan abd/pelvis to r/o abscess.   Less likely with ileus staring to resolve & WBC more normal  -maintain pain control  -anxiolysis.  Follow MS with h/o bipolar dz  -chest pain less.  perhaps GERD - cont PPI  -metoprolol for HTN  -anemia - post op follow  -VTE prophylaxis- SCDs, heparin given poor mobility & obesity & pelvic surgery  -mobilize as tolerated to help recovery - GET HER UP!  The patient is stable.  There is no evidence of peritonitis, acute abdomen, nor shock.  There is no strong evidence of failure of improvement nor  decline with current non-operative management.  There is no need for surgery at the present moment.  We will continue to follow.   Adin Hector, M.D., F.A.C.S. Gastrointestinal and Minimally Invasive Surgery [facility] [address] [place], [place] [address] [phone] Main / Paging   07/24/2014   Results:   Labs: Results for orders placed or performed during the hospital encounter of 07/19/14 (from the past 48 hour(s))   CBC     Status: Abnormal   Collection Time: 07/22/14  8:48 AM  Result Value Ref Range   WBC 18.3 (H) 4.0 - 10.5 K/uL   RBC 3.70 (L) 3.87 - 5.11 MIL/uL   Hemoglobin 9.5 (L) 12.0 - 15.0 g/dL   HCT 28.9 (L) 36.0 - 46.0 %   MCV 78.1 78.0 - 100.0 fL   MCH 25.7 (L) 26.0 - 34.0 pg   MCHC 32.9 30.0 - 36.0 g/dL   RDW 18.9 (H) 11.5 - 15.5 %   Platelets 262 150 - 400 K/uL    Comment: REPEATED TO VERIFY DELTA CHECK NOTED   CBC     Status: Abnormal   Collection Time: 07/23/14  5:14 AM  Result Value Ref Range   WBC 16.7 (H) 4.0 - 10.5 K/uL   RBC 3.71 (L) 3.87 - 5.11 MIL/uL   Hemoglobin 9.5 (L) 12.0 - 15.0 g/dL   HCT 29.1 (L) 36.0 - 46.0 %   MCV 78.4 78.0 - 100.0 fL   MCH 25.6 (L) 26.0 - 34.0 pg   MCHC 32.6 30.0 - 36.0 g/dL   RDW 18.7 (H) 11.5 - 15.5 %   Platelets 309 150 - 400 K/uL  Basic metabolic panel     Status: Abnormal   Collection Time: 07/23/14  5:14 AM  Result Value Ref Range   Sodium 137 135 - 145 mmol/L    Comment: Please note change in reference range.   Potassium 3.4 (L) 3.5 - 5.1 mmol/L    Comment: Please note change in reference range. DELTA CHECK NOTED REPEATED TO VERIFY NO VISIBLE HEMOLYSIS    Chloride 103 96 - 112 mEq/L   CO2 25 19 - 32 mmol/L   Glucose, Bld 111 (H) 70 - 99 mg/dL   BUN 7 6 - 23 mg/dL   Creatinine, Ser 0.74 0.50 - 1.10 mg/dL   Calcium 8.1 (L) 8.4 - 10.5 mg/dL   GFR calc non Af Amer >90 >90 mL/min   GFR calc Af Amer >90 >90 mL/min    Comment: (NOTE) The eGFR has been calculated using the CKD EPI equation. This calculation has not been validated in all clinical situations. eGFR's persistently <90 mL/min signify possible Chronic Kidney Disease.    Anion gap 9 5 - 15  CBC     Status: Abnormal   Collection Time: 07/24/14  5:43 AM  Result Value Ref Range   WBC 11.8 (H) 4.0 - 10.5 K/uL   RBC 3.72 (L) 3.87 - 5.11 MIL/uL   Hemoglobin 9.6 (L) 12.0 - 15.0 g/dL   HCT 29.1 (L) 36.0 - 46.0 %   MCV 78.2 78.0 - 100.0 fL   MCH 25.8 (L) 26.0 - 34.0 pg    MCHC 33.0 30.0 - 36.0 g/dL   RDW 18.5 (H) 11.5 - 15.5 %   Platelets 304 150 - 400 K/uL  Basic metabolic panel     Status: Abnormal   Collection Time: 07/24/14  5:43 AM  Result Value Ref Range   Sodium 137 135 -  145 mmol/L    Comment: Please note change in reference range.   Potassium 3.3 (L) 3.5 - 5.1 mmol/L    Comment: Please note change in reference range.   Chloride 103 96 - 112 mEq/L   CO2 24 19 - 32 mmol/L   Glucose, Bld 82 70 - 99 mg/dL   BUN 7 6 - 23 mg/dL   Creatinine, Ser 0.67 0.50 - 1.10 mg/dL   Calcium 8.1 (L) 8.4 - 10.5 mg/dL   GFR calc non Af Amer >90 >90 mL/min   GFR calc Af Amer >90 >90 mL/min    Comment: (NOTE) The eGFR has been calculated using the CKD EPI equation. This calculation has not been validated in all clinical situations. eGFR's persistently <90 mL/min signify possible Chronic Kidney Disease.    Anion gap 10 5 - 15    Imaging / Studies: Dg Chest 2 View  07/22/2014   CLINICAL DATA:  Fever and cough for 1 day.  Initial encounter.  EXAM: CHEST  2 VIEW  COMPARISON:  Chest radiograph performed 07/02/2014  FINDINGS: The lungs are well-aerated. Mild focal left mid lung opacity could reflect mild pneumonia. There is no evidence of pleural effusion or pneumothorax.  The heart is normal in size; the mediastinal contour is within normal limits. An enteric tube is seen ending overlying the body of the stomach. No acute osseous abnormalities are seen.  IMPRESSION: Mild focal left midlung opacity could reflect mild pneumonia.   Electronically Signed   By: Garald Balding M.D.   On: 07/22/2014 23:03    Medications / Allergies: per chart  Antibiotics: Anti-infectives    Start     Dose/Rate Route Frequency Ordered Stop   07/22/14 2100  piperacillin-tazobactam (ZOSYN) IVPB 3.375 g     3.375 g12.5 mL/hr over 240 Minutes Intravenous Every 8 hours 07/22/14 2058     07/19/14 0929  ceFAZolin (ANCEF) 2-3 GM-% IVPB SOLR    Comments:  Harvell, Gwendolyn  : cabinet  override      07/19/14 0929 07/19/14 1435       Note: Portions of this report may have been transcribed using voice recognition software. Every effort was made to ensure accuracy; however, inadvertent computerized transcription errors may be present.   Any transcriptional errors that result from this process are unintentional.

## 2014-07-24 NOTE — Progress Notes (Signed)
5 Days Post-Op Procedure(s) (LRB):  Expand All Collapse All    ATTEMPTED LAPAROSCOPIC ASSISTED VAGINAL HYSTERECTOMY,  HYSTERECTOMY ABDOMINAL withrightsalpingectomy, bilateral ooperectomy SMALL BOWEL RESECTION (N/A)      Subjective: Patient reports + flatus yesterday, but none today. She is not nauseated with clears. She is ambulating and voiding.  Objective: I have reviewed patient's vital signs, intake and output, medications and labs.  General: alert Resp: wheezes bibasilar Cardio: regular rate and rhythm, S1, S2 normal, no murmur, click, rub or gallop GI: soft, non-tender; bowel sounds normal; no masses,  no organomegaly  Assessment: s/p Procedure(s): ATTEMPTED LAPAROSCOPIC ASSISTED VAGINAL HYSTERECTOMY,  (Bilateral) HYSTERECTOMY ABDOMINAL with bilateral salpingectomy (Bilateral) SMALL BOWEL RESECTION (N/A): progressing well  Plan: Encourage ambulation  Dulolax prn Nebs treatment  LOS: 5 days    Cailin Gebel C. 07/24/2014, 1:50 PM

## 2014-07-25 ENCOUNTER — Inpatient Hospital Stay (HOSPITAL_COMMUNITY): Payer: Self-pay

## 2014-07-25 LAB — CBC
HCT: 26.6 % — ABNORMAL LOW (ref 36.0–46.0)
Hemoglobin: 8.8 g/dL — ABNORMAL LOW (ref 12.0–15.0)
MCH: 25.7 pg — ABNORMAL LOW (ref 26.0–34.0)
MCHC: 33.1 g/dL (ref 30.0–36.0)
MCV: 77.8 fL — ABNORMAL LOW (ref 78.0–100.0)
PLATELETS: 398 10*3/uL (ref 150–400)
RBC: 3.42 MIL/uL — AB (ref 3.87–5.11)
RDW: 18.7 % — AB (ref 11.5–15.5)
WBC: 11.5 10*3/uL — ABNORMAL HIGH (ref 4.0–10.5)

## 2014-07-25 LAB — BASIC METABOLIC PANEL
ANION GAP: 9 (ref 5–15)
BUN: 6 mg/dL (ref 6–23)
CO2: 26 mmol/L (ref 19–32)
CREATININE: 0.73 mg/dL (ref 0.50–1.10)
Calcium: 8.3 mg/dL — ABNORMAL LOW (ref 8.4–10.5)
Chloride: 101 mEq/L (ref 96–112)
GFR calc non Af Amer: >90 mL/min (ref >90)
Glucose, Bld: 103 mg/dL — ABNORMAL HIGH (ref 70–99)
Potassium: 3.1 mmol/L — ABNORMAL LOW (ref 3.5–5.1)
SODIUM: 136 mmol/L (ref 135–145)

## 2014-07-25 MED ORDER — ATENOLOL 25 MG PO TABS
25.0000 mg | ORAL_TABLET | Freq: Every day | ORAL | Status: DC
  Administered 2014-07-26 – 2014-07-29 (×4): 25 mg via ORAL
  Filled 2014-07-25 (×4): qty 1

## 2014-07-25 MED ORDER — QUETIAPINE FUMARATE ER 400 MG PO TB24: 400.0000 mg | ORAL_TABLET | Freq: Every day | ORAL | Status: DC

## 2014-07-25 MED ORDER — QUETIAPINE FUMARATE ER 400 MG PO TB24
400.0000 mg | ORAL_TABLET | Freq: Every day | ORAL | Status: DC
Start: 2014-07-25 — End: 2014-07-29
  Administered 2014-07-25 – 2014-07-28 (×4): 400 mg via ORAL
  Filled 2014-07-25 (×4): qty 1

## 2014-07-25 MED ORDER — HYDROMORPHONE HCL 1 MG/ML IJ SOLN
0.5000 mg | INTRAMUSCULAR | Status: DC | PRN
  Administered 2014-07-25 (×2): 0.5 mg via INTRAVENOUS
  Administered 2014-07-27 – 2014-07-28 (×2): 1 mg via INTRAVENOUS
  Filled 2014-07-25 (×4): qty 1

## 2014-07-25 MED ORDER — KCL-LACTATED RINGERS 20 MEQ/L IV SOLN
INTRAVENOUS | Status: DC
  Administered 2014-07-25 – 2014-07-26 (×2): via INTRAVENOUS
  Filled 2014-07-25 (×6): qty 1000

## 2014-07-25 NOTE — Progress Notes (Signed)
6 Days Post-Op Procedure(s) (LRB): ATTEMPTED LAPAROSCOPIC ASSISTED VAGINAL HYSTERECTOMY,  HYSTERECTOMY ABDOMINAL withrightsalpingectomy, bilateral ooperectomy SMALL BOWEL RESECTION (N/A)    Subjective: Patient reports no problems voiding. She denies nausea/vomitting. No flatus today or yesterday. BM yesterday. Her RLQ pain is improved.  Objective: I have reviewed patient's vital signs, intake and output, medications and labs.  General: alert Resp: clear to auscultation bilaterally Cardio: regular rate and rhythm, S1, S2 normal, no murmur, click, rub or gallop GI: soft, non-tender; bowel sounds normal; no masses,  no organomegaly  Her abd Xray showed finding c/w ileus versus SBO.  Assessment: s/p Procedure(s): ATTEMPTED LAPAROSCOPIC ASSISTED VAGINAL HYSTERECTOMY,  (Bilateral) HYSTERECTOMY ABDOMINAL with bilateral salpingectomy (Bilateral) SMALL BOWEL RESECTION (N/A): stable  Plan: Encourage ambulation  Add K+ to her IVF I will ask gen surg about replacing the NGT.  LOS: 6 days    Dominick Morella C. 07/25/2014, 12:48 PM

## 2014-07-25 NOTE — Progress Notes (Signed)
Pt was sitting up in chair when I arrived. Pt's pastor was bedside. Pt was highly complimentary of [place] and how she stayed w/her during her initial admission into the hospital and while she was very afraid. Pt said she is better and described how a standard surgery turned out to be a complicated one and she also spent time in ICU. She was very thankful for chaplain support. Allowed pt to continue visit w/her pastor and promised to pass compliment on to [facility] Ernest Haber Chaplain   07/25/14 1400  Clinical Encounter Type  Visited With Patient and family together

## 2014-07-25 NOTE — Progress Notes (Signed)
Patient ID: Patricia [name], female   DOB: 1970/11/06, 43 y.o.   MRN: [id number] 6 Days Post-Op  Subjective: Complaining of a lot of pain. Some typical surgical pain around the incision but intermittent sharp right lower quadrant pain that is severe. Not really any worse than yesterday. Slight nausea without vomiting. Has not had any significant flatus or bowel movement since day before yesterday.  Objective: Vital signs in last 24 hours: Temp:  [97.6 F (36.4 C)-99.1 F (37.3 C)] 98.8 F (37.1 C) (12/28 1000) Pulse Rate:  [83-99] 83 (12/28 1000) Resp:  [18-22] 20 (12/28 1000) BP: (104-131)/(58-79) 109/58 mmHg (12/28 1000) SpO2:  [95 %-100 %] 96 % (12/28 1000) Last BM Date: 07/25/14  Intake/Output from previous day: 12/27 0701 - 12/28 0700 In: 1042 [P.O.:520; I.V.:372; IV Piggyback:150] Out: 1425 [Urine:1425] Intake/Output this shift:    General appearance: alert, cooperative and mild distress Resp: clear to auscultation bilaterally GI: moderate tenderness right lower quadrant with remainder of her abdomen nontender. Few bowel sounds. No peritoneal signs. Incision/Wound: no unusual drainage or erythema  Lab Results:   Recent Labs  07/24/14 0543 07/25/14 0527  WBC 11.8* 11.5*  HGB 9.6* 8.8*  HCT 29.1* 26.6*  PLT 304 398   BMET  Recent Labs  07/24/14 0543 07/25/14 0527  NA 137 136  K 3.3* 3.1*  CL 103 101  CO2 24 26  GLUCOSE 82 103*  BUN 7 6  CREATININE 0.67 0.73  CALCIUM 8.1* 8.3*     Studies/Results: No results found.  Anti-infectives: Anti-infectives    Start     Dose/Rate Route Frequency Ordered Stop   07/22/14 2100  piperacillin-tazobactam (ZOSYN) IVPB 3.375 g     3.375 g12.5 mL/hr over 240 Minutes Intravenous Every 8 hours 07/22/14 2058     07/19/14 0929  ceFAZolin (ANCEF) 2-3 GM-% IVPB SOLR    Comments:  Patricia [name]  : cabinet override      07/19/14 0929 07/19/14 1435      Assessment/Plan: s/p Procedure(s): ATTEMPTED LAPAROSCOPIC  ASSISTED VAGINAL HYSTERECTOMY,  HYSTERECTOMY ABDOMINAL with bilateral salpingectomy SMALL BOWEL RESECTION Still with significant pain and no bowel function yet. Likely ileus and cramps from returning bowel function. Continue just sips of liquids and encouraged to ambulate. Check abdominal x-rays today. CBC in a.m.    LOS: 6 days    Patricia [name] T 07/25/2014

## 2014-07-25 NOTE — Progress Notes (Signed)
Per Dr. Excell Seltzer okay for pt to have sips of coffee or tea today.

## 2014-07-25 NOTE — Progress Notes (Signed)
ANTIBIOTIC CONSULT NOTE - FOLLOW UP  Pharmacy Consult for Zosyn Indication: Empiric Therapy  Allergies  Allergen Reactions  . Peanut-Containing Drug Products Anaphylaxis    Peanut Oil Only.  Can eat peanuts with no problems    Patient Measurements: Height: 5\' 4"  (162.6 cm) Weight: 249 lb 8 oz (113.172 kg) IBW/kg (Calculated) : 54.7 kg   Vital Signs: Temp: 98.8 F (37.1 C) (12/28 1000) Temp Source: Oral (12/28 1000) BP: 109/58 mmHg (12/28 1000) Pulse Rate: 83 (12/28 1000) Intake/Output from previous day: 12/27 0701 - 12/28 0700 In: [id number] [P.O.:520; I.V.:372; IV Piggyback:150] Out: 1425 [Urine:1425]    Labs:  Recent Labs  07/23/14 0514 07/24/14 0543 07/25/14 0527  WBC 16.7* 11.8* 11.5*  HGB 9.5* 9.6* 8.8*  PLT 309 304 398  CREATININE 0.74 0.67 0.73   Estimated Creatinine Clearance: 110.6 mL/min (by C-G formula based on Cr of 0.73).    Microbiology: No results found for this or any previous visit (from the past 720 hour(s)).  Anti-infectives    Start     Dose/Rate Route Frequency Ordered Stop   07/22/14 2100  piperacillin-tazobactam (ZOSYN) IVPB 3.375 g     3.375 g12.5 mL/hr over 240 Minutes Intravenous Every 8 hours 07/22/14 2058     07/19/14 0929  ceFAZolin (ANCEF) 2-3 GM-% IVPB SOLR    Comments:  Harvell, Gwendolyn  : cabinet override      07/19/14 0929 07/19/14 1435      Assessment: Pt continuing on Zosyn post-op.  Pt has been afebrile for > 48 hours  Plan:  Continue zosyn 3.375 grams IV Q 8 hr.  Beryle Lathe 07/25/2014,10:37 AM

## 2014-07-26 LAB — CBC
HCT: 26.5 % — ABNORMAL LOW (ref 36.0–46.0)
Hemoglobin: 8.6 g/dL — ABNORMAL LOW (ref 12.0–15.0)
MCH: 25.5 pg — ABNORMAL LOW (ref 26.0–34.0)
MCHC: 32.5 g/dL (ref 30.0–36.0)
MCV: 78.6 fL (ref 78.0–100.0)
Platelets: 422 K/uL — ABNORMAL HIGH (ref 150–400)
RBC: 3.37 MIL/uL — ABNORMAL LOW (ref 3.87–5.11)
RDW: 18.8 % — ABNORMAL HIGH (ref 11.5–15.5)
WBC: 11.5 K/uL — ABNORMAL HIGH (ref 4.0–10.5)

## 2014-07-26 MED ORDER — IBUPROFEN 600 MG PO TABS
600.0000 mg | ORAL_TABLET | Freq: Four times a day (QID) | ORAL | Status: DC | PRN
  Administered 2014-07-26 – 2014-07-29 (×6): 600 mg via ORAL
  Filled 2014-07-26 (×6): qty 1

## 2014-07-26 MED ORDER — ACETAMINOPHEN 325 MG PO TABS
650.0000 mg | ORAL_TABLET | Freq: Four times a day (QID) | ORAL | Status: DC | PRN
  Administered 2014-07-27: 650 mg via ORAL
  Filled 2014-07-26: qty 2

## 2014-07-26 MED ORDER — OXYCODONE HCL 5 MG PO TABS
5.0000 mg | ORAL_TABLET | ORAL | Status: DC | PRN
  Administered 2014-07-26: 10 mg via ORAL
  Administered 2014-07-27: 5 mg via ORAL
  Administered 2014-07-27: 15 mg via ORAL
  Administered 2014-07-28 (×2): 10 mg via ORAL
  Administered 2014-07-28: 15 mg via ORAL
  Administered 2014-07-28: 10 mg via ORAL
  Administered 2014-07-29: 15 mg via ORAL
  Filled 2014-07-26 (×2): qty 3
  Filled 2014-07-26: qty 1
  Filled 2014-07-26 (×2): qty 2
  Filled 2014-07-26: qty 3
  Filled 2014-07-26: qty 2
  Filled 2014-07-26 (×2): qty 1

## 2014-07-26 NOTE — Progress Notes (Signed)
Pt stating that she needs to eat. She has been here for a week and is starving. Dr. Hulan Fray notified and stated to allow pt to eat a pack of crackers and if tolerates call back in one hour. Will continue to monitor pt.

## 2014-07-26 NOTE — Progress Notes (Addendum)
Pt refused 0600 Heparin Injection. Pt states she is no longer sick and does not need the medication. Pt educated and still refused. Wasted 5,000 units of Heparin in med room sink.

## 2014-07-26 NOTE — Progress Notes (Signed)
Pt ambulating in hallway, states "a little air came out."

## 2014-07-26 NOTE — Progress Notes (Signed)
7 Days Post-Op  Subjective: She is not sure about flatus, no BM, complaining of pain.  IV is out and she wants regular diet.    Objective: Vital signs in last 24 hours: Temp:  [97.9 F (36.6 C)-99.6 F (37.6 C)] 98.3 F (36.8 C) (12/29 1400) Pulse Rate:  [82-107] 82 (12/29 1400) Resp:  [18-20] 20 (12/29 1400) BP: (108-142)/(62-84) 120/82 mmHg (12/29 1400) SpO2:  [94 %-100 %] 100 % (12/29 1400) Last BM Date: 07/24/14 600 PO  Regular diet this afternoon Afebrile, TM 99.6, VSS WBC still up to 11.5, last K+3.1 Ileus on film yesterday Intake/Output from previous day: 08/16/23 0701 - 12/29 0700 In: 1983.3 [P.O.:600; I.V.:1333.3; IV Piggyback:50] Out: 1525 [Urine:1525] Intake/Output this shift: Total I/O In: 835 [P.O.:120; I.V.:665; IV Piggyback:50] Out: -   General appearance: alert, cooperative and no distress GI: soft complaining of pain, she is up in the chair and I cannot see wound under the panus in this position.  Few bowel sounds.  Lab Results:   Recent Labs  08/15/2014 0527 07/26/14 0540  WBC 11.5* 11.5*  HGB 8.8* 8.6*  HCT 26.6* 26.5*  PLT 398 422*    BMET  Recent Labs  07/24/14 0543 15-Aug-2014 0527  NA 137 136  K 3.3* 3.1*  CL 103 101  CO2 24 26  GLUCOSE 82 103*  BUN 7 6  CREATININE 0.67 0.73  CALCIUM 8.1* 8.3*   PT/INR No results for input(s): LABPROT, INR in the last 72 hours.   Recent Labs Lab 07/20/14 0615  AST 38*  ALT 15  ALKPHOS 29*  BILITOT 0.6  PROT 5.8*  ALBUMIN 3.2*     Lipase     Component Value Date/Time   LIPASE 22 04/19/2010 0545     Studies/Results: Dg Abd 2 Views  2014/08/15   CLINICAL DATA:  Pelvic pain and constipation; hysterectomy 6 days ago  EXAM: ABDOMEN - 2 VIEW  COMPARISON:  Lumbar spine series of March 20, 2014  FINDINGS: There are loops of mildly distended gas and fluid-filled small bowel in the midline. There is no significant gas in the rectum. There small amounts of stool gas in the colon. No free  extraluminal gas collections are demonstrated. There is minimal subsegmental atelectasis at the left lung base.  IMPRESSION: Findings consistent with a small bowel ileus or partial distal small bowel obstruction. There is no evidence of perforation.   Electronically Signed   By: David  [place]   On: 2014/08/15 11:15    Medications: . atenolol  25 mg Oral Daily  . beclomethasone  2 puff Inhalation BID  . fentaNYL  50 mcg Transdermal Q72H  . heparin subcutaneous  5,000 Units Subcutaneous 3 times per day  . lip balm  1 application Topical BID  . pantoprazole  40 mg Oral BID AC  . piperacillin-tazobactam (ZOSYN)  IV  3.375 g Intravenous Q8H  . QUEtiapine  400 mg Oral QHS  . saccharomyces boulardii  250 mg Oral BID    Assessment/Plan 1. Menorrhagia, anemia, fibroid, morbid obesity ATTEMPTED LAPAROSCOPIC VAGINAL HYSTERECTOMY, EXTENSIVE ENTEROLYSIS, EXTENSIVE ADHESIOLYSIS, BILATERAL OOPHERECTOMY, RIGHT SALPINGECTOMY, Small bowel injury with SBR. Dr Clovia Cuff 2. Asthma 3. Bipolar 4. Hypertension 5. GERD 6. Hx of Migraines 7. Body mass index is 44.09 kg/(m^2). 8. Anemia  9. Prediabetes  10.  Post op ileus   Plan:  I would not go beyond fulls till she has some flatus and some stool.  I encouraged her to drink allot of clears since IV  is out.  Ambulate more. She wants something for pain beyond the the percocet.  I will give her some extra oxycodone, and ibuprofen   See if that helps recheck labs and film in AM.    LOS: 7 days    Kirby Argueta 07/26/2014

## 2014-07-26 NOTE — Progress Notes (Signed)
7 Days Post-Op Procedure(s) (LRB): ATTEMPTED LAPAROSCOPIC ASSISTED VAGINAL HYSTERECTOMY,  HYSTERECTOMY ABDOMINAL withrightsalpingectomy, bilateral ooperectomy SMALL BOWEL RESECTION (N/A)    Subjective: Patient reports tolerating PO liquids without nausea and is requesting a regular diet. She says that she is "not sure" if she is having flatus.    Objective: I have reviewed patient's vital signs, intake and output, medications and labs.  General: alert and cooperative Resp: clear to auscultation bilaterally Cardio: regular rate and rhythm, S1, S2 normal, no murmur, click, rub or gallop GI: soft, non-tender; bowel sounds normal; no masses,  no organomegaly , NO distension Incisions: healing well, no sign of infection  Assessment: s/p Procedure(s): ATTEMPTED LAPAROSCOPIC ASSISTED VAGINAL HYSTERECTOMY,  (Bilateral) HYSTERECTOMY ABDOMINAL with bilateral salpingectomy (Bilateral) SMALL BOWEL RESECTION (N/A): stable  Pneumonia  Plan: Encourage ambulation  I will await gen surg, but she may be able to try a regular diet  LOS: 7 days    Rowe Warman C. 07/26/2014, 8:04 AM

## 2014-07-26 NOTE — Progress Notes (Signed)
Dr. Hulan Fray called to check on pt. Pt tolerated pack of saltine crackers and states she is ready to eat. Pt also questioning why she still has to have an IV. Dr. Hulan Fray notified and stated to advance pt to regular diet, and discontinue zosyn and IV site once this current dose of Zosyn finishes infusing. Will continue to monitor pt.

## 2014-07-27 ENCOUNTER — Inpatient Hospital Stay (HOSPITAL_COMMUNITY): Payer: MEDICAID

## 2014-07-27 LAB — BASIC METABOLIC PANEL
Anion gap: 8 (ref 5–15)
BUN: <5 mg/dL — ABNORMAL LOW (ref 6–23)
CO2: 27 mmol/L (ref 19–32)
CREATININE: 0.71 mg/dL (ref 0.50–1.10)
Calcium: 8.6 mg/dL (ref 8.4–10.5)
Chloride: 105 mEq/L (ref 96–112)
GFR calc non Af Amer: >90 mL/min (ref >90)
Glucose, Bld: 129 mg/dL — ABNORMAL HIGH (ref 70–99)
POTASSIUM: 3.2 mmol/L — AB (ref 3.5–5.1)
SODIUM: 140 mmol/L (ref 135–145)

## 2014-07-27 LAB — CBC
HCT: 28.6 % — ABNORMAL LOW (ref 36.0–46.0)
Hemoglobin: 9.2 g/dL — ABNORMAL LOW (ref 12.0–15.0)
MCH: 25.3 pg — ABNORMAL LOW (ref 26.0–34.0)
MCHC: 32.2 g/dL (ref 30.0–36.0)
MCV: 78.6 fL (ref 78.0–100.0)
PLATELETS: 517 10*3/uL — AB (ref 150–400)
RBC: 3.64 MIL/uL — AB (ref 3.87–5.11)
RDW: 18.7 % — AB (ref 11.5–15.5)
WBC: 10.5 10*3/uL (ref 4.0–10.5)

## 2014-07-27 MED ORDER — POTASSIUM CHLORIDE IN NACL 40-0.9 MEQ/L-% IV SOLN: INTRAVENOUS | Status: DC

## 2014-07-27 MED ORDER — POTASSIUM CHLORIDE 2 MEQ/ML IV SOLN
INTRAVENOUS | Status: DC
  Administered 2014-07-27 – 2014-07-28 (×3): via INTRAVENOUS
  Filled 2014-07-27 (×5): qty 1000

## 2014-07-27 MED ORDER — ONDANSETRON HCL 4 MG PO TABS
4.0000 mg | ORAL_TABLET | Freq: Four times a day (QID) | ORAL | Status: DC | PRN
  Administered 2014-07-27: 4 mg via ORAL
  Filled 2014-07-27: qty 1

## 2014-07-27 MED ORDER — POTASSIUM CHLORIDE IN NACL 40-0.9 MEQ/L-% IV SOLN
INTRAVENOUS | Status: DC
  Filled 2014-07-27 (×2): qty 1000

## 2014-07-27 NOTE — Progress Notes (Signed)
Patient had a full liquid diet tonight in which she ate half of a cream of mushroom soup, some vanilla pudding, and sips of coke and water. Patient does report positive flatus, denies nausea/vomiting. Will continue to monitor

## 2014-07-27 NOTE — Progress Notes (Signed)
8 Days Post-Op  Subjective: She is having nausea with liquids, some flatus once.  Her incision looks OK, but it is located under her panus so keeping it dry is an issue.  She said pain was a little better.  She remains very sore.   Objective: Vital signs in last 24 hours: Temp:  [97.9 F (36.6 C)-99.1 F (37.3 C)] 98.2 F (36.8 C) (12/30 0551) Pulse Rate:  [82-100] 94 (12/30 0551) Resp:  [18-20] 18 (12/30 0551) BP: (120-142)/(76-94) 126/84 mmHg (12/30 0551) SpO2:  [97 %-100 %] 98 % (12/30 0551) Last BM Date: 07/24/14 120 PO recorded Afebrile, VSS K+ 3.2 H/H is stable Intake/Output from previous day: 12/29 0701 - 12/30 0700 In: 1286.7 [P.O.:120; I.V.:1116.7; IV Piggyback:50] Out: -  Intake/Output this shift:    General appearance: alert, cooperative and no distress GI: soft, very few BS, and very little flatus  Lab Results:   Recent Labs  07/26/14 0540 07/27/14 0536  WBC 11.5* 10.5  HGB 8.6* 9.2*  HCT 26.5* 28.6*  PLT 422* 517*    BMET  Recent Labs  08-11-2014 0527 07/27/14 0536  NA 136 140  K 3.1* 3.2*  CL 101 105  CO2 26 27  GLUCOSE 103* 129*  BUN 6 <5*  CREATININE 0.73 0.71  CALCIUM 8.3* 8.6   PT/INR No results for input(s): LABPROT, INR in the last 72 hours.  No results for input(s): AST, ALT, ALKPHOS, BILITOT, PROT, ALBUMIN in the last 168 hours.   Lipase     Component Value Date/Time   LIPASE 22 04/19/2010 0545     Studies/Results: Dg Abd 2 Views  2014/08/11   CLINICAL DATA:  Pelvic pain and constipation; hysterectomy 6 days ago  EXAM: ABDOMEN - 2 VIEW  COMPARISON:  Lumbar spine series of March 20, 2014  FINDINGS: There are loops of mildly distended gas and fluid-filled small bowel in the midline. There is no significant gas in the rectum. There small amounts of stool gas in the colon. No free extraluminal gas collections are demonstrated. There is minimal subsegmental atelectasis at the left lung base.  IMPRESSION: Findings consistent with a  small bowel ileus or partial distal small bowel obstruction. There is no evidence of perforation.   Electronically Signed   By: David  [place]   On: 11-Aug-2014 11:15    Medications: . atenolol  25 mg Oral Daily  . beclomethasone  2 puff Inhalation BID  . fentaNYL  50 mcg Transdermal Q72H  . heparin subcutaneous  5,000 Units Subcutaneous 3 times per day  . lip balm  1 application Topical BID  . pantoprazole  40 mg Oral BID AC  . QUEtiapine  400 mg Oral QHS  . saccharomyces boulardii  250 mg Oral BID    Assessment/Plan 1. Menorrhagia, anemia, fibroid, morbid obesity ATTEMPTED LAPAROSCOPIC VAGINAL HYSTERECTOMY, EXTENSIVE ENTEROLYSIS, EXTENSIVE ADHESIOLYSIS, BILATERAL OOPHERECTOMY, RIGHT SALPINGECTOMY, Small bowel injury with SBR. Dr Clovia Cuff 2. Asthma 3. Bipolar 4. Hypertension 5. GERD 6. Hx of Migraines 7. Body mass index is 44.09 kg/(m^2). 8. Anemia  9. Prediabetes 10. Post op ileus   Plan:  I am going to leave her on liquids, but put the IV back in and get her K+ up.  She is walking.  i told her she could go home safely when she was eating a regular diet and having BM's.      LOS: 8 days    Makye Radle 07/27/2014

## 2014-07-27 NOTE — Progress Notes (Signed)
8 Days Post-Op Procedure(s) (LRB): ATTEMPTED LAPAROSCOPIC ASSISTED VAGINAL HYSTERECTOMY,  HYSTERECTOMY ABDOMINAL withrightsalpingectomy, bilateral ooperectomy SMALL BOWEL RESECTION (N/A)     Subjective: Patient reports nausea since last night. She is sipping on water, but has no appetite   Objective: I have reviewed patient's vital signs, intake and output, medications and labs.  General: alert Resp: clear to auscultation bilaterally Cardio: regular rate and rhythm, S1, S2 normal, no murmur, click, rub or gallop GI: soft, non-tender; bowel sounds normal; no masses,  no organomegaly  I hear NABS today. No abd distension. XRay- no change  Assessment: s/p Procedure(s): ATTEMPTED LAPAROSCOPIC ASSISTED VAGINAL HYSTERECTOMY,  (Bilateral) HYSTERECTOMY ABDOMINAL with bilateral salpingectomy (Bilateral) SMALL BOWEL RESECTION (N/A): stable and ileus present  Plan: Encourage ambulation  LOS: 8 days    Chevy Sweigert C. 07/27/2014, 12:43 PM

## 2014-07-28 LAB — BASIC METABOLIC PANEL
Anion gap: 6 (ref 5–15)
BUN: <5 mg/dL — ABNORMAL LOW (ref 6–23)
CALCIUM: 8.1 mg/dL — AB (ref 8.4–10.5)
CO2: 22 mmol/L (ref 19–32)
CREATININE: 0.63 mg/dL (ref 0.50–1.10)
Chloride: 111 mEq/L (ref 96–112)
GFR calc non Af Amer: >90 mL/min (ref >90)
Glucose, Bld: 139 mg/dL — ABNORMAL HIGH (ref 70–99)
Potassium: 3.7 mmol/L (ref 3.5–5.1)
SODIUM: 139 mmol/L (ref 135–145)

## 2014-07-28 NOTE — Progress Notes (Signed)
Patient had watery BM at this time. About 200 cc. Will continue to monitor

## 2014-07-28 NOTE — Progress Notes (Signed)
9 Days Post-Op Procedure(s) (LRB): ATTEMPTED LAPAROSCOPIC ASSISTED VAGINAL HYSTERECTOMY,  HYSTERECTOMY ABDOMINAL withrightsalpingectomy, bilateral ooperectomy SMALL BOWEL RESECTION (N/A)    Subjective: Patient reports tolerating PO, + flatus, + BM and no problems voiding.    Objective: I have reviewed patient's vital signs, intake and output, medications and labs.  General: alert Resp: clear to auscultation bilaterally Cardio: regular rate and rhythm, S1, S2 normal, no murmur, click, rub or gallop GI: soft, non-tender; bowel sounds normal; no masses,  no organomegaly  Assessment: s/p Procedure(s): ATTEMPTED LAPAROSCOPIC ASSISTED VAGINAL HYSTERECTOMY,  (Bilateral) HYSTERECTOMY ABDOMINAL with bilateral salpingectomy (Bilateral) SMALL BOWEL RESECTION (N/A): progressing well  Plan: Discharge home tomorrow if she tolerates her diet.  LOS: 9 days    Crayton Savarese C. 07/28/2014, 11:42 AM

## 2014-07-28 NOTE — Progress Notes (Signed)
9 Days Post-Op  Subjective: She is doing much better.  + BM. Tolerating Full liquid, passing flatus.  Objective: Vital signs in last 24 hours: Temp:  [97.7 F (36.5 C)-98.2 F (36.8 C)] 98.2 F (36.8 C) (12/31 0544) Pulse Rate:  [68-94] 68 (12/31 0544) Resp:  [18] 18 (12/31 0544) BP: (124-139)/(70-90) 139/85 mmHg (12/31 0544) SpO2:  [97 %-99 %] 97 % (12/31 0544) Last BM Date: 07/24/14 Full liquid 240 PO recorded +BM x 1 Afebrile, VSS Lab still pending Intake/Output from previous day: 11-Aug-2023 0701 - 12/31 0700 In: 1436.7 [P.O.:240; I.V.:1196.7] Out: 725 [Urine:525; Stool:200] Intake/Output this shift: Total I/O In: 790 [P.O.:240; I.V.:550] Out: 475 [Urine:275; Stool:200]  General appearance: alert, cooperative and no distress GI: soft BS still slow, +flatus, +BM  Lab Results:   Recent Labs  07/26/14 0540 08-10-2014 0536  WBC 11.5* 10.5  HGB 8.6* 9.2*  HCT 26.5* 28.6*  PLT 422* 517*    BMET  Recent Labs  August 10, 2014 0536  NA 140  K 3.2*  CL 105  CO2 27  GLUCOSE 129*  BUN <5*  CREATININE 0.71  CALCIUM 8.6   PT/INR No results for input(s): LABPROT, INR in the last 72 hours.  No results for input(s): AST, ALT, ALKPHOS, BILITOT, PROT, ALBUMIN in the last 168 hours.   Lipase     Component Value Date/Time   LIPASE 22 04/19/2010 0545     Studies/Results: Dg Abd 2 Views  08/10/2014   CLINICAL DATA:  Ileus following gastrointestinal surgery 07/19/2014.  EXAM: ABDOMEN - 2 VIEW  COMPARISON:  07/25/2014  FINDINGS: No intraperitoneal free air is identified. Moderately dilated small bowel loops in the central abdomen measure up to approximately 6 cm in diameter, overall not significantly changed from the prior study. Air-fluid levels are again seen. Gas and a small amount of stool are present throughout the colon, and a small amount of gas is present in the rectum. No abnormal soft tissue calcification or acute osseous abnormality is identified. Minimal opacity is  again seen in the left lung base, incompletely evaluated but may reflect subsegmental atelectasis.  IMPRESSION: Unchanged small bowel dilatation, compatible with postoperative ileus although partial obstruction is not excluded.   Electronically Signed   By: Logan Bores   On: 08/10/2014 09:15    Medications: . atenolol  25 mg Oral Daily  . beclomethasone  2 puff Inhalation BID  . fentaNYL  50 mcg Transdermal Q72H  . heparin subcutaneous  5,000 Units Subcutaneous 3 times per day  . lip balm  1 application Topical BID  . pantoprazole  40 mg Oral BID AC  . QUEtiapine  400 mg Oral QHS  . saccharomyces boulardii  250 mg Oral BID    Assessment/Plan 1. Menorrhagia, anemia, fibroid, morbid obesity ATTEMPTED LAPAROSCOPIC VAGINAL HYSTERECTOMY, EXTENSIVE ENTEROLYSIS, EXTENSIVE ADHESIOLYSIS, BILATERAL OOPHERECTOMY, RIGHT SALPINGECTOMY, Small bowel injury with SBR. Dr Clovia Cuff 2. Asthma 3. Bipolar 4. Hypertension 5. GERD 6. Hx of Migraines 7. Body mass index is 44.09 kg/(m^2). 8. Anemia  9. Prediabetes 10. Post op ileus    Plan:  Advance diet and follow up by Dr. Hulan Fray. Labs pending, K+ hopefully will be better.  LOS: 9 days    Tomorrow Dehaas 07/28/2014

## 2014-07-29 MED ORDER — OXYCODONE HCL 5 MG PO TABS
5.0000 mg | ORAL_TABLET | ORAL | Status: DC | PRN
Start: 2014-07-29 — End: 2014-09-07

## 2014-07-29 MED ORDER — IBUPROFEN 600 MG PO TABS: 600.0000 mg | ORAL_TABLET | Freq: Four times a day (QID) | ORAL | Status: DC | PRN

## 2014-07-29 NOTE — Discharge Summary (Signed)
Physician Discharge Summary  Patient ID: Patricia [name] MRN: [id number] DOB/AGE: 45/45/1972 45 y.o.  Admit date: 07/19/2014 Discharge date: 07/29/2014  Admission Diagnoses: symptomatic fibroids, anemia, morbid obesity  Discharge Diagnoses: same + resolution of pneumonia and ileus  Principal Problem:   Menorrhagia s/p abdominal hysterectomy 07/19/2014 Active Problems:   Obstructive sleep apnea   Post-operative state   Pain in the chest   Morbid obesity   Status post small bowel resection 07/19/2014   Discharged Condition: good  Hospital Course: She underwent an ATTEMPTED LAPAROSCOPIC ASSISTED VAGINAL HYSTERECTOMY,  HYSTERECTOMY ABDOMINAL with rightsalpingectomy, bilateral ooperectomy SMALL BOWEL RESECTION (N/A)  During the case and immediately post operatively, she received a total of 4 units of PRBCs. She spent the next several days in the AICU. She had some chest discomfort in the PACU and a cardiology consult was obtained. She ruled out for a MI and will follow up with the cardiologist in a month or so.   She developed a low grade fever on POD #3 and an CXR suggested a LLL pneumonia. Zosyn was started and she remained afebrile throughout the rest of her hospital stay.  Her NG tube was left in until she had flatus. She started ice chips and gradually her ileus resolved.   By the day of discharge, she was ambulating, voiding, tolerating a regular diet without nausea or vomiting.  She voiced her readiness to go home with her family.  Consults: cardiology and general surgery  Significant Diagnostic Studies: labs: Her post op hemoglobin remained stable at about 9. Her WBC continued to fall. Her K+ was normal by the day of discharge.  Treatments: antibiotics: Zosyn and surgery as described above  Discharge Exam: Blood pressure 120/66, pulse 80, temperature 97.6 F (36.4 C), temperature source Oral, resp. rate 18, height 5\' 4"  (1.626 m), weight 105.121 kg (231 lb 12 oz),  last menstrual period 07/07/2014, SpO2 98 %. General appearance: alert  Heart- rrr Lungs- CTAB Abd- obese, NABS, NT, ND Incisions- healing without evidence of infection  Disposition: 01-Home or Self Care     Medication List    STOP taking these medications        loratadine 10 MG tablet  Commonly known as:  CLARITIN      TAKE these medications        albuterol 108 (90 BASE) MCG/ACT inhaler  Commonly known as:  PROVENTIL HFA;VENTOLIN HFA  Inhale 2 puffs into the lungs every 6 (six) hours as needed for shortness of breath.     albuterol (2.5 MG/3ML) 0.083% nebulizer solution  Commonly known as:  PROVENTIL  Take 2.5 mg by nebulization every 6 (six) hours as needed for shortness of breath.     ARIPiprazole 15 MG tablet  Commonly known as:  ABILIFY  Take 15 mg by mouth at bedtime.     atenolol 50 MG tablet  Commonly known as:  TENORMIN  Take 50 mg by mouth daily.     atorvastatin 40 MG tablet  Commonly known as:  LIPITOR  Take 1 tablet (40 mg total) by mouth daily at 6 PM.     beclomethasone 40 MCG/ACT inhaler  Commonly known as:  QVAR  Inhale into the lungs 2 (two) times daily.     esomeprazole 40 MG capsule  Commonly known as:  NEXIUM  Take 40 mg by mouth at bedtime.     ibuprofen 600 MG tablet  Commonly known as:  ADVIL,MOTRIN  Take 1 tablet (600 mg total) by mouth every  6 (six) hours as needed for headache, mild pain or moderate pain.     metFORMIN 500 MG tablet  Commonly known as:  GLUCOPHAGE  Take 500 mg by mouth at bedtime. 1 tablet daily for 1 week, if not experiencing side effects take twice daily     oxyCODONE 5 MG immediate release tablet  Commonly known as:  Oxy IR/ROXICODONE  Take 1-3 tablets (5-15 mg total) by mouth every 4 (four) hours as needed for moderate pain.     QUEtiapine 400 MG 24 hr tablet  Commonly known as:  SEROQUEL XR  Take 400 mg by mouth at bedtime. Take with 200 mg tablet to = total dose of 600 mg     QUEtiapine 200 MG 24 hr  tablet  Commonly known as:  SEROQUEL XR  Take 200 mg by mouth at bedtime. Take with 400 mg tablet to = total dose of 600 mg     sucralfate 1 G tablet  Commonly known as:  CARAFATE  Take 1 tablet (1 g total) by mouth 4 (four) times daily.     SUMAtriptan 50 MG tablet  Commonly known as:  IMITREX  Take 1 tablet (50 mg total) by mouth every 2 (two) hours as needed for migraine.     traZODone 100 MG tablet  Commonly known as:  DESYREL  Take 200 mg by mouth at bedtime.           Follow-up Information    Follow up with Nilah Belcourt C., MD. Schedule an appointment as soon as possible for a visit in 1 month.   Specialty:  Obstetrics and Gynecology   Contact information:   [place] [address] [phone]       Signed: Emily Filbert 07/29/2014, 9:47 AM

## 2014-07-29 NOTE — Discharge Instructions (Signed)
Ileus The intestine (bowel, or gut) is a long, muscular tube connecting your stomach to your rectum. If the intestine stops working, food cannot pass through. This is called an ileus. This can happen for a variety of reasons. Ileus is a major medical problem that usually requires hospitalization. If your intestine stops working because of a blockage, this is called a bowel obstruction and is a different condition. CAUSES   Surgery in your abdomen. This can last from a few hours to a few days.  An infection or inflammation in the belly (abdomen). This includes inflammation of the lining of the abdomen (peritonitis).  Infection or inflammation in other parts of the body, such as pneumonia or pancreatitis.  Passage of gallstones or kidney stones.  Damage to the nerves or blood vessels which go to the bowel.  Imbalance in the salts in the blood (electrolytes).  Injury to the brain and/or spinal cord.  Medications. Many medications can cause ileus or make it worse. The most common of these are strong pain medications. SYMPTOMS  Symptoms of bowel obstruction come from the bowel inactivity. They may include:  Bloating. Your belly gets bigger (distension).  Pain or discomfort in the abdomen.  Poor appetite, feeling sick to your stomach (nausea), and vomiting.  You may also not be able to hear your normal bowel sounds, such as "growling" in your stomach. DIAGNOSIS   Your history and a physical exam will usually suggest to your caregiver that you have an ileus.  X-rays or a CT scan of your abdomen will confirm the diagnosis. X-rays, CT scans, and lab tests may also suggest the cause. TREATMENT   Rest the intestine until it starts working again. This is most often accomplished by:  Stopping intake of oral food and drink. Dehydration is prevented by using IV (intravenous) fluids.  Sometimes, a nasogastric tube (NG tube) is needed. This is a narrow plastic tube inserted through your nose  and into your stomach. It is connected to suction to keep the stomach emptied out. This also helps treat the nausea and vomiting.  If there is an imbalance in the electrolytes, they are corrected with supplements in your intravenous fluids.  Medications that might make an ileus worse might be stopped.  There are no medications that reliably treat ileus, though your caregiver may suggest a trial of certain medications.  If your condition is slow to resolve, you will be reevaluated to be sure another condition, such as a blockage, is not present. Ileus is common and usually has a good outcome. Depending on the cause of your ileus, it usually can be treated by your caregivers with good results. Sometimes, specialists (surgeons or gastroenterologists) are asked to assist in your care.  HOME CARE INSTRUCTIONS   Follow your caregiver's instructions regarding diet and fluid intake. This will usually include drinking plenty of clear fluids, avoiding alcohol and caffeine, and eating a gentle diet.  Follow your caregiver's instructions regarding activity. A period of rest is sometimes advised before returning to work or school.  Take only medications prescribed by your caregiver. Be especially careful with narcotic pain medication, which can slow your bowel activity and contribute to ileus.  Keep any follow-up appointments with your caregiver or specialists. SEEK MEDICAL CARE IF:   You have a recurrence of nausea, vomiting, or abdominal discomfort.  You develop fever of more than 102 F (38.9 C). SEEK IMMEDIATE MEDICAL CARE IF:   You have severe abdominal pain.  You are unable to keep  fluids down. Document Released: 07/18/2003 Document Revised: 11/29/2013 Document Reviewed: 11/17/2008 [facility] Patient Information 2015 [place], [place]. This information is not intended to replace advice given to you by your health care provider. Make sure you discuss any questions you have with your health care  provider.

## 2014-07-29 NOTE — Progress Notes (Signed)
D/c instructions and prescriptions reviewed with patient. Reviewed f/u appt with all her physicians. Instructed to call on Monday 1-4 and schedule her appt. No further questions at this time. Pt d/c home stable, ambulatory to private car with family.

## 2014-08-01 ENCOUNTER — Telehealth: Payer: Self-pay

## 2014-08-01 NOTE — Telephone Encounter (Signed)
Patient called stating she was discharged from the hospital this past weekend and has fluid in her legs from her feet to her knees and would like to know what she should do.

## 2014-08-01 NOTE — Telephone Encounter (Signed)
Called patient who states her legs are swollen from her ankles to her knees bilaterally and reports they were in the hospital as well; reports she was told to elevate legs which she has done. Denies any pitting or pain. Advised patient to continue to elevate legs as often as possible and to monitor edema. If it gets worse, develops pitting, pain, or begins to develop headache, SOB, abdominal pain, go to ED for evaluation. Patient verbalized understanding. Advised she schedule appointment with cardiologist as recommended in discharge summary given her history of chest pain. Patient verbalized understanding and gratitude. No further questions or concerns.

## 2014-08-04 ENCOUNTER — Inpatient Hospital Stay (HOSPITAL_COMMUNITY)
Admission: AD | Admit: 2014-08-04 | Discharge: 2014-08-04 | Discharge status: Against Medical Advice | Payer: Self-pay | Source: Ambulatory Visit | Attending: Obstetrics & Gynecology | Admitting: Obstetrics & Gynecology

## 2014-08-04 ENCOUNTER — Telehealth: Payer: Self-pay | Admitting: *Deleted

## 2014-08-04 NOTE — MAU Note (Signed)
Pt called, not in lobby 

## 2014-08-04 NOTE — MAU Note (Signed)
Pt not in lobby.  

## 2014-08-04 NOTE — MAU Note (Signed)
Pt called, not in lobby.  Did not inform staff she was leaving.

## 2014-08-04 NOTE — Telephone Encounter (Signed)
Patient called and stated that she is experiencing worsening swelling in her feet. They are very uncomfortable. She has been keeping them elevated but this is not helping. She wants to know what she should do. Patient sounded winded on the phone. Advised that she come to MAU for evaluation. Patient agreeable and will have her sister bring her in.

## 2014-08-05 ENCOUNTER — Encounter: Payer: Self-pay | Admitting: *Deleted

## 2014-08-09 ENCOUNTER — Emergency Department (HOSPITAL_COMMUNITY): Payer: MEDICAID

## 2014-08-09 ENCOUNTER — Emergency Department (HOSPITAL_COMMUNITY)
Admission: EM | Admit: 2014-08-09 | Discharge: 2014-08-09 | Disposition: A | Discharge status: Home / Self Care | Payer: MEDICAID | Attending: Emergency Medicine | Admitting: Emergency Medicine

## 2014-08-09 ENCOUNTER — Encounter (HOSPITAL_COMMUNITY): Payer: Self-pay | Admitting: Emergency Medicine

## 2014-08-09 DIAGNOSIS — J45909 Unspecified asthma, uncomplicated: Secondary | ICD-10-CM | POA: Insufficient documentation

## 2014-08-09 DIAGNOSIS — Z862 Personal history of diseases of the blood and blood-forming organs and certain disorders involving the immune mechanism: Secondary | ICD-10-CM | POA: Insufficient documentation

## 2014-08-09 DIAGNOSIS — Z79899 Other long term (current) drug therapy: Secondary | ICD-10-CM | POA: Insufficient documentation

## 2014-08-09 DIAGNOSIS — Z8669 Personal history of other diseases of the nervous system and sense organs: Secondary | ICD-10-CM | POA: Insufficient documentation

## 2014-08-09 DIAGNOSIS — F209 Schizophrenia, unspecified: Secondary | ICD-10-CM | POA: Insufficient documentation

## 2014-08-09 DIAGNOSIS — E119 Type 2 diabetes mellitus without complications: Secondary | ICD-10-CM | POA: Insufficient documentation

## 2014-08-09 DIAGNOSIS — K219 Gastro-esophageal reflux disease without esophagitis: Secondary | ICD-10-CM | POA: Insufficient documentation

## 2014-08-09 DIAGNOSIS — F419 Anxiety disorder, unspecified: Secondary | ICD-10-CM | POA: Insufficient documentation

## 2014-08-09 DIAGNOSIS — Z7951 Long term (current) use of inhaled steroids: Secondary | ICD-10-CM | POA: Insufficient documentation

## 2014-08-09 DIAGNOSIS — J069 Acute upper respiratory infection, unspecified: Secondary | ICD-10-CM | POA: Insufficient documentation

## 2014-08-09 DIAGNOSIS — E669 Obesity, unspecified: Secondary | ICD-10-CM | POA: Insufficient documentation

## 2014-08-09 DIAGNOSIS — R112 Nausea with vomiting, unspecified: Secondary | ICD-10-CM | POA: Insufficient documentation

## 2014-08-09 DIAGNOSIS — Z87891 Personal history of nicotine dependence: Secondary | ICD-10-CM | POA: Insufficient documentation

## 2014-08-09 DIAGNOSIS — I10 Essential (primary) hypertension: Secondary | ICD-10-CM | POA: Insufficient documentation

## 2014-08-09 DIAGNOSIS — R1110 Vomiting, unspecified: Secondary | ICD-10-CM

## 2014-08-09 DIAGNOSIS — E780 Pure hypercholesterolemia: Secondary | ICD-10-CM | POA: Insufficient documentation

## 2014-08-09 DIAGNOSIS — Z8701 Personal history of pneumonia (recurrent): Secondary | ICD-10-CM | POA: Insufficient documentation

## 2014-08-09 DIAGNOSIS — R1030 Lower abdominal pain, unspecified: Secondary | ICD-10-CM | POA: Insufficient documentation

## 2014-08-09 DIAGNOSIS — G43909 Migraine, unspecified, not intractable, without status migrainosus: Secondary | ICD-10-CM | POA: Insufficient documentation

## 2014-08-09 DIAGNOSIS — F319 Bipolar disorder, unspecified: Secondary | ICD-10-CM | POA: Insufficient documentation

## 2014-08-09 DIAGNOSIS — R059 Cough, unspecified: Secondary | ICD-10-CM

## 2014-08-09 DIAGNOSIS — R05 Cough: Secondary | ICD-10-CM

## 2014-08-09 MED ORDER — ACETAMINOPHEN 325 MG PO TABS
650.0000 mg | ORAL_TABLET | Freq: Four times a day (QID) | ORAL | Status: DC | PRN
  Administered 2014-08-09: 650 mg via ORAL
  Filled 2014-08-09: qty 2

## 2014-08-09 MED ORDER — ONDANSETRON 4 MG PO TBDP: 4.0000 mg | ORAL_TABLET | Freq: Once | ORAL | Status: DC

## 2014-08-09 MED ORDER — ONDANSETRON HCL 4 MG PO TABS
4.0000 mg | ORAL_TABLET | Freq: Once | ORAL | Status: DC
  Administered 2014-08-09: 4 mg via ORAL
  Filled 2014-08-09: qty 1

## 2014-08-09 MED ORDER — HYDROCOD POLST-CHLORPHEN POLST 10-8 MG/5ML PO LQCR: 5.0000 mL | Freq: Two times a day (BID) | ORAL | Status: DC | PRN

## 2014-08-09 MED ORDER — ONDANSETRON 4 MG PO TBDP: 8.0000 mg | ORAL_TABLET | Freq: Once | ORAL | Status: DC

## 2014-08-09 NOTE — ED Notes (Signed)
Patient transported to X-ray without distress.  

## 2014-08-09 NOTE — ED Notes (Signed)
PT ambulated with baseline gait; VSS; A&Ox3; no signs of distress; respirations even and unlabored; skin warm and dry; no questions upon discharge.  

## 2014-08-09 NOTE — Discharge Instructions (Signed)
Call for a follow up appointment with a Family or Primary Care Provider.  Return if Symptoms worsen.   Take medication as prescribed.  Mucinex-DM over the counter will also help symptoms. Drink plenty of fluids.    Emergency Department Resource Guide 1) Find a Doctor and Pay Out of Pocket Although you won't have to find out who is covered by your insurance plan, it is a good idea to ask around and get recommendations. You will then need to call the office and see if the doctor you have chosen will accept you as a new patient and what types of options they offer for patients who are self-pay. Some doctors offer discounts or will set up payment plans for their patients who do not have insurance, but you will need to ask so you aren't surprised when you get to your appointment.  2) Contact Your Local Health Department Not all health departments have doctors that can see patients for sick visits, but many do, so it is worth a call to see if yours does. If you don't know where your local health department is, you can check in your phone book. The CDC also has a tool to help you locate your state's health department, and many state websites also have listings of all of their local health departments.  3) Find a [facility] If your illness is not likely to be very severe or complicated, you may want to try a walk in clinic. These are popping up all over the country in pharmacies, drugstores, and shopping centers. They're usually staffed by nurse practitioners or physician assistants that have been trained to treat common illnesses and complaints. They're usually fairly quick and inexpensive. However, if you have serious medical issues or chronic medical problems, these are probably not your best option.  No Primary Care Doctor: - Call Health Connect at  [phone] - they can help you locate a primary care doctor that  accepts your insurance, provides certain services, etc. - Physician Referral Service-  [phone]  Chronic Pain Problems: Organization         Address  Phone   Notes  [facility]  [phone] Patients need to be referred by their primary care doctor.   Medication Assistance: Organization         Address  Phone   Notes  [facility] [address] [place], [place] [address] [phone] --Must be a resident of [facility] -- Must have NO insurance coverage whatsoever (no Medicaid/ Medicare, etc.) -- The pt. MUST have a primary care doctor that directs their care regularly and follows them in the community   Med[facility]ist  [phone]   [employer]  [phone]    Agencies that provide inexpensive medical care: Organization         Address  Phone   Notes  [place]  [phone]   [facility]    [phone]   [facility] [place], [place] [address] [phone]   [place] [address] [place] [phone]   Planned Parenthood    [phone]   [facility]    [phone]   [place] [address], [place] Phone:  [phone], Fax:  [phone] Hours of Operation:  9 am - 6 pm, M-F.  Also accepts Medicaid/Medicare and self-pay.  [facility] for [place] [address], [address], [place] Phone: [phone], Fax: [phone]. Hours of Operation:  8:30 am - 5:30 pm, M-F.  Also accepts Medicaid and self-pay.  [facility] [place] [address] [place] Phone: [phone]   [place], [place], [place] [phone], Ext. [phone] Mondays & Thursdays: 7-9 AM.  First 15 patients are seen on a first come, first serve basis.    [place] Providers:  Organization         Address  Phone   Notes  [facility] [address],  [place] [phone] Also accepts self-pay patients.  [facility] [id number] [place], [place]  [phone]   [place], [address], [place] [phone]   [facility] [address] [place] [phone]   Lucianne Lei [address], [place]   [phone] Only accepts [place] Access [place] patients after they have their name applied to their card.   Self-Pay (no insurance) in [facility]:  Organization         Address  Phone   Notes  Sickle Cell Patients, [facility] [place] [phone]   [facility] [place] [phone]   [facility] [place]  [place], [place], [place] [phone]   [facility]/Dr. Osei-Bonsu  [address] [place] [address] [place] or [address], [place] [phone] Phone number for both [place] and [place] locations is the same.  Urgent Medical and [facility] [address], [place] [phone]   [facility] [address] [place] or [address] [phone]   [facility] [address], [place] [phone], phone; [phone], fax Sees patients 1st and 3rd Saturday of every month.  Must not qualify for public or private insurance (i.e. Medicaid, Medicare, [place] Health Choice, Veterans' Benefits)  Household income should be no more than 200% of the poverty level The clinic cannot treat you if you are pregnant or think you are pregnant  Sexually transmitted diseases are not treated at the clinic.    Dental Care: Organization         Address  Phone  Notes  [facility] Department of [facility] [place] [phone] Accepts children up to age 81 who are enrolled in  [place] or [place]; pregnant women with a Medicaid card; and children who have applied for Medicaid or  Health Choice, but were declined, whose parents can pay a reduced fee at time of service.  [facility] Department of [facility]  [address], [place] [phone] Accepts children up to age 44 who are enrolled in [place] or [place]; pregnant women with a Medicaid card; and children who have applied for Medicaid or  Health Choice, but were declined, whose parents can pay a reduced fee at time of service.  [facility]  [place] [phone] Patients are seen by appointment only. Walk-ins are not accepted. [place] will see patients 52 years of age and older. Monday - Tuesday (8am-5pm) Most Wednesdays (8:30-5pm) $30 per  visit, cash only  [facility] Adult [employer] PROGRAM  [address], [facility] [address] [facility] [phone] Patients are seen by appointment only. Walk-ins are not accepted. [place] will see patients 48 years of age and older. One Wednesday Evening (Monthly: Volunteer Based).  $30 per visit, cash only  [place]  [phone] for adults; Children under age 62, call Graduate Pediatric Dentistry at [phone]. Children aged 41-14, please call [phone] to request a pediatric application.  Dental services are provided in all areas of dental care including fillings, crowns and bridges, complete and partial dentures, implants, gum treatment, root canals, and extractions. Preventive care is also provided. Treatment is provided to both adults and children. Patients are selected via a lottery and there is often a waiting list.   [facility] [address] [place]  [phone] [email or web address]   [facility] [address] [place], [place] [phone], Ext. [phone] Second and Fourth Thursday of each month, opens at 6:30  AM; Clinic ends at 9 AM.  Patients are seen on a first-come first-served basis, and a limited number are seen during each clinic.   [facility]  [address] [place] D[place]er [place], [place] [phone]   Eligibility Requirements You must have lived in [place], [place], or [place] counties for at least the last three months.   You cannot be eligible for state or federal sponsored [employer], including [employer], [place], or [employer].   You generally cannot be eligible for healthcare insurance through your employer.    How to apply: Eligibility screenings are held every Tuesday and Wednesday afternoon from 1:00 pm until 4:00 pm. You do not need an appointment for the interview!  [facility] [address], [place], [place]   [place]  [place] Department  [place]  [phone]    Behavioral Health Resources in the Community: Intensive Outpatient Programs Organization         Address  Phone  Notes  [place]. [address] [place], [place] [phone]   [facility] Outpatient [address] [place], [place]   ADS: Alcohol [facility] Drug S[facility] [address] [place], [place]   [place] [address]  [place], [place] or [phone]   Substance Abuse Resources Organization         Address  Phone  Notes  Alcohol and Drug Services  [phone]   [place]  [phone]   [facility] [place]   Chinita Pester  [phone]   Residential & Outpatient Substance Abuse Program  [phone]   Psychological Services Organization         Address  Phone  Notes  [facility] [place]  [place]  [phone]   [place] [address] [place] or  [phone]    Mobile Crisis Teams Organization         Address  Phone  Notes  [facility], Mobile Crisis Care Unit  [phone]   [facility]  [address] [place], [place]   [place] [address], [place] [phone]    Self-Help/Support Groups Organization         Address  Phone  Notes  [facility]ental [facility] Assoc. of [place] - variety of support groups  [place] Call for more information  Narcotics Anonymous (NA), [facility] [address], [employer] [place]  2 meetings at this location   Special educational needs teacher         Address  Phone  Notes  [facility]idential [facility] [place],    [place]  [phone]   [facility]  [address] [place] [id number], [place], [place]   [facility] [place], [place] [phone] Admissions: 8am-3pm M-F  Incentives Substance [place] [address]    [place], [place] [phone]   [facility] [address] [place], [place], [place]   [facility] [address],  [place], [place]   [facility] - Intensive Outpatient [address] Kristeen Mans [address], [place], [place]   [facility] ([place] [address]  [place], [place] [phone] or [phone]   [facility] (RTS) [address], [place], [place] Accepts Medicaid  [facility] [place] [address]  [place] [phone] Substance Abuse/Addiction Treatment   [facility] Organization         Address  Phone  Notes  [facility]  [phone]   Domenic Schwab, PhD [address] Arlis Porta [place], [place]   [phone] or [phone]   [place], [place] [phone]   [facility] [address],  [place], [place] [phone] Insurance/Medicaid/sponsorship through [facility] and Families [address] [place]                                    [place], [place] [phone] [place] [name] [place], [place] [phone]    Dr. Adele Schilder  [phone]   [facility] Dept. 1) [address] [place] 2) [place] 3)  [place] [address], [place] [phone]  [phone]   [place] [phone] or [phone] (After Hours)

## 2014-08-09 NOTE — ED Provider Notes (Signed)
44 year old female, presents with complaints of some coughing, has also had some nausea, on exam she has a soft nontender abdomen, she has recently had a hysterectomy, the suture sites are clean and intact other than a single exposed deep suture on the left side of the incision, there is no drainage, no surrounding redness or tenderness. Her lungs and heart are clear, she otherwise appears well.  She has no abdominal tenderness on exam, she in fact states "my surgical site and surgery had gone well, had no complaints, it is only the coughing"  Medical screening examination/treatment/procedure(s) were conducted as a shared visit with non-physician practitioner(s) and myself.  I personally evaluated the patient during the encounter.  Clinical Impression:   Final diagnoses:  Cough  URI (upper respiratory infection)  Post-tussive emesis         [name], MD 08/10/14 [phone]

## 2014-08-09 NOTE — ED Notes (Signed)
Computer scanner not recognizing order; 8 mg odt zofran given per nausea protocol.
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
## 2014-08-09 NOTE — ED Notes (Signed)
PT had recent hysterectomy. States had pneumonia and having belly pain. States she cannot shake it.

## 2014-09-07 ENCOUNTER — Encounter: Payer: Self-pay | Admitting: Obstetrics & Gynecology

## 2014-09-07 ENCOUNTER — Ambulatory Visit: Payer: Self-pay | Admitting: Obstetrics & Gynecology

## 2014-09-07 VITALS — BP 161/96 | HR 102 | Temp 97.9°F | Ht 64.0 in | Wt 250.2 lb

## 2014-09-07 DIAGNOSIS — R1031 Right lower quadrant pain: Secondary | ICD-10-CM

## 2014-09-07 DIAGNOSIS — Z9889 Other specified postprocedural states: Secondary | ICD-10-CM

## 2014-09-07 MED ORDER — OXYCODONE-ACETAMINOPHEN 5-325 MG PO TABS: 1.0000 | ORAL_TABLET | Freq: Four times a day (QID) | ORAL | Status: DC | PRN
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
## 2014-09-08 ENCOUNTER — Emergency Department: Payer: Self-pay | Admitting: Student

## 2014-09-09 ENCOUNTER — Ambulatory Visit (HOSPITAL_COMMUNITY): Payer: MEDICAID | Attending: Obstetrics & Gynecology

## 2014-11-19 ENCOUNTER — Encounter (HOSPITAL_COMMUNITY): Payer: Self-pay | Admitting: Emergency Medicine

## 2014-11-19 ENCOUNTER — Emergency Department (HOSPITAL_COMMUNITY): Payer: Self-pay

## 2014-11-19 ENCOUNTER — Emergency Department (HOSPITAL_COMMUNITY)
Admission: EM | Admit: 2014-11-19 | Discharge: 2014-11-19 | Disposition: A | Discharge status: Home / Self Care | Payer: Self-pay | Attending: Emergency Medicine | Admitting: Emergency Medicine

## 2014-11-19 DIAGNOSIS — E780 Pure hypercholesterolemia: Secondary | ICD-10-CM | POA: Insufficient documentation

## 2014-11-19 DIAGNOSIS — E119 Type 2 diabetes mellitus without complications: Secondary | ICD-10-CM | POA: Insufficient documentation

## 2014-11-19 DIAGNOSIS — Z87891 Personal history of nicotine dependence: Secondary | ICD-10-CM | POA: Insufficient documentation

## 2014-11-19 DIAGNOSIS — Z8701 Personal history of pneumonia (recurrent): Secondary | ICD-10-CM | POA: Insufficient documentation

## 2014-11-19 DIAGNOSIS — Z79899 Other long term (current) drug therapy: Secondary | ICD-10-CM | POA: Insufficient documentation

## 2014-11-19 DIAGNOSIS — Z7951 Long term (current) use of inhaled steroids: Secondary | ICD-10-CM | POA: Insufficient documentation

## 2014-11-19 DIAGNOSIS — Y998 Other external cause status: Secondary | ICD-10-CM | POA: Insufficient documentation

## 2014-11-19 DIAGNOSIS — Z23 Encounter for immunization: Secondary | ICD-10-CM | POA: Insufficient documentation

## 2014-11-19 DIAGNOSIS — S0990XA Unspecified injury of head, initial encounter: Secondary | ICD-10-CM | POA: Insufficient documentation

## 2014-11-19 DIAGNOSIS — I10 Essential (primary) hypertension: Secondary | ICD-10-CM | POA: Insufficient documentation

## 2014-11-19 DIAGNOSIS — G43909 Migraine, unspecified, not intractable, without status migrainosus: Secondary | ICD-10-CM | POA: Insufficient documentation

## 2014-11-19 DIAGNOSIS — F209 Schizophrenia, unspecified: Secondary | ICD-10-CM | POA: Insufficient documentation

## 2014-11-19 DIAGNOSIS — Y9289 Other specified places as the place of occurrence of the external cause: Secondary | ICD-10-CM | POA: Insufficient documentation

## 2014-11-19 DIAGNOSIS — S0101XA Laceration without foreign body of scalp, initial encounter: Secondary | ICD-10-CM | POA: Insufficient documentation

## 2014-11-19 DIAGNOSIS — F319 Bipolar disorder, unspecified: Secondary | ICD-10-CM | POA: Insufficient documentation

## 2014-11-19 DIAGNOSIS — K219 Gastro-esophageal reflux disease without esophagitis: Secondary | ICD-10-CM | POA: Insufficient documentation

## 2014-11-19 DIAGNOSIS — T1490XA Injury, unspecified, initial encounter: Secondary | ICD-10-CM

## 2014-11-19 DIAGNOSIS — Y9389 Activity, other specified: Secondary | ICD-10-CM | POA: Insufficient documentation

## 2014-11-19 DIAGNOSIS — F419 Anxiety disorder, unspecified: Secondary | ICD-10-CM | POA: Insufficient documentation

## 2014-11-19 DIAGNOSIS — J45909 Unspecified asthma, uncomplicated: Secondary | ICD-10-CM | POA: Insufficient documentation

## 2014-11-19 DIAGNOSIS — Z862 Personal history of diseases of the blood and blood-forming organs and certain disorders involving the immune mechanism: Secondary | ICD-10-CM | POA: Insufficient documentation

## 2014-11-19 MED ORDER — TETANUS-DIPHTH-ACELL PERTUSSIS 5-2.5-18.5 LF-MCG/0.5 IM SUSP
0.5000 mL | Freq: Once | INTRAMUSCULAR | Status: AC
  Administered 2014-11-19: 0.5 mL via INTRAMUSCULAR
  Filled 2014-11-19: qty 0.5

## 2014-11-19 MED ORDER — OXYCODONE-ACETAMINOPHEN 5-325 MG PO TABS: 1.0000 | ORAL_TABLET | ORAL | Status: DC | PRN

## 2014-11-19 MED ORDER — LIDOCAINE-EPINEPHRINE 1 %-1:100000 IJ SOLN
10.0000 mL | Freq: Once | INTRAMUSCULAR | Status: DC
  Filled 2014-11-19: qty 10

## 2014-11-19 MED ORDER — OXYCODONE-ACETAMINOPHEN 5-325 MG PO TABS
2.0000 | ORAL_TABLET | Freq: Once | ORAL | Status: AC
  Administered 2014-11-19: 2 via ORAL
  Filled 2014-11-19: qty 2

## 2014-11-19 MED ORDER — LIDOCAINE-EPINEPHRINE (PF) 1 %-1:200000 IJ SOLN
INTRAMUSCULAR | Status: AC
  Filled 2014-11-19: qty 10

## 2014-11-19 MED ORDER — IBUPROFEN 800 MG PO TABS: 800.0000 mg | ORAL_TABLET | Freq: Three times a day (TID) | ORAL | Status: DC
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
## 2014-11-19 NOTE — Discharge Instructions (Signed)
Assault, General °Assault includes any behavior, whether intentional or reckless, which results in bodily injury to another person and/or damage to property. Included in this would be any behavior, intentional or reckless, that by its nature would be understood (interpreted) by a reasonable person as intent to harm another person or to damage his/her property. Threats may be oral or written. They may be communicated through regular mail, computer, fax, or phone. These threats may be direct or implied. °FORMS OF ASSAULT INCLUDE: °· Physically assaulting a person. This includes physical threats to inflict physical harm as well as: °¨ Slapping. °¨ Hitting. °¨ Poking. °¨ Kicking. °¨ Punching. °¨ Pushing. °· Arson. °· Sabotage. °· Equipment vandalism. °· Damaging or destroying property. °· Throwing or hitting objects. °· Displaying a weapon or an object that appears to be a weapon in a threatening manner. °¨ Carrying a firearm of any kind. °¨ Using a weapon to harm someone. °· Using greater physical size/strength to intimidate another. °¨ Making intimidating or threatening gestures. °¨ Bullying. °¨ Hazing. °· Intimidating, threatening, hostile, or abusive language directed toward another person. °¨ It communicates the intention to engage in violence against that person. And it leads a reasonable person to expect that violent behavior may occur. °· Stalking another person. °IF IT HAPPENS AGAIN: °· Immediately call for emergency help (911 in [place] °· If someone poses clear and immediate danger to you, seek legal authorities to have a protective or restraining order put in place. °· Less threatening assaults can at least be reported to authorities. °STEPS TO TAKE IF A SEXUAL ASSAULT HAS HAPPENED °· Go to an area of safety. This may include a shelter or staying with a friend. Stay away from the area where you have been attacked. A large percentage of sexual assaults are caused by a friend, relative or associate. °· If  medications were given by your caregiver, take them as directed for the full length of time prescribed. °· Only take over-the-counter or prescription medicines for pain, discomfort, or fever as directed by your caregiver. °· If you have come in contact with a sexual disease, find out if you are to be tested again. If your caregiver is concerned about the HIV/AIDS virus, he/she may require you to have continued testing for several months. °· For the protection of your privacy, test results can not be given over the phone. Make sure you receive the results of your test. If your test results are not back during your visit, make an appointment with your caregiver to find out the results. Do not assume everything is normal if you have not heard from your caregiver or the medical facility. It is important for you to follow up on all of your test results. °· File appropriate papers with authorities. This is important in all assaults, even if it has occurred in a family or by a friend. °SEEK MEDICAL CARE IF: °· You have new problems because of your injuries. °· You have problems that may be because of the medicine you are taking, such as: °¨ Rash. °¨ Itching. °¨ Swelling. °¨ Trouble breathing. °· You develop belly (abdominal) pain, feel sick to your stomach (nausea) or are vomiting. °· You begin to run a temperature. °· You need supportive care or referral to a rape crisis center. These are centers with trained personnel who can help you get through this ordeal. °SEEK IMMEDIATE MEDICAL CARE IF: °· You are afraid of being threatened, beaten, or abused. In [place] call 911. °· You   receive new injuries related to abuse.  You develop severe pain in any area injured in the assault or have any change in your condition that concerns you.  You faint or lose consciousness.  You develop chest pain or shortness of breath. Document Released: 07/15/2005 Document Revised: 10/07/2011 Document Reviewed: 03/02/2008 [facility] Patient  Information 2015 [place], [place]. This information is not intended to replace advice given to you by your health care provider. Make sure you discuss any questions you have with your health care provider.   Please call your doctor for a followup appointment within 24-48 hours. When you talk to your doctor please let them know that you were seen in the emergency department and have them acquire all of your records so that they can discuss the findings with you and formulate a treatment plan to fully care for your new and ongoing problems.
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
## 2014-11-19 NOTE — ED Notes (Signed)
Pt talking on phone.  Awaiting MD for laceration repair.

## 2014-11-29 ENCOUNTER — Emergency Department (HOSPITAL_COMMUNITY)
Admission: EM | Admit: 2014-11-29 | Discharge: 2014-11-29 | Disposition: A | Discharge status: Home / Self Care | Payer: Self-pay | Attending: Nurse Practitioner | Admitting: Nurse Practitioner

## 2014-11-29 ENCOUNTER — Encounter (HOSPITAL_COMMUNITY): Payer: Self-pay

## 2014-11-29 DIAGNOSIS — Z8669 Personal history of other diseases of the nervous system and sense organs: Secondary | ICD-10-CM | POA: Insufficient documentation

## 2014-11-29 DIAGNOSIS — Z4802 Encounter for removal of sutures: Secondary | ICD-10-CM | POA: Insufficient documentation

## 2014-11-29 DIAGNOSIS — K219 Gastro-esophageal reflux disease without esophagitis: Secondary | ICD-10-CM | POA: Insufficient documentation

## 2014-11-29 DIAGNOSIS — Z862 Personal history of diseases of the blood and blood-forming organs and certain disorders involving the immune mechanism: Secondary | ICD-10-CM | POA: Insufficient documentation

## 2014-11-29 DIAGNOSIS — I10 Essential (primary) hypertension: Secondary | ICD-10-CM | POA: Insufficient documentation

## 2014-11-29 DIAGNOSIS — Z87891 Personal history of nicotine dependence: Secondary | ICD-10-CM | POA: Insufficient documentation

## 2014-11-29 DIAGNOSIS — J45909 Unspecified asthma, uncomplicated: Secondary | ICD-10-CM | POA: Insufficient documentation

## 2014-11-29 DIAGNOSIS — Z79899 Other long term (current) drug therapy: Secondary | ICD-10-CM | POA: Insufficient documentation

## 2014-11-29 DIAGNOSIS — F319 Bipolar disorder, unspecified: Secondary | ICD-10-CM | POA: Insufficient documentation

## 2014-11-29 DIAGNOSIS — E119 Type 2 diabetes mellitus without complications: Secondary | ICD-10-CM | POA: Insufficient documentation

## 2014-11-29 DIAGNOSIS — E780 Pure hypercholesterolemia: Secondary | ICD-10-CM | POA: Insufficient documentation

## 2014-11-29 DIAGNOSIS — F419 Anxiety disorder, unspecified: Secondary | ICD-10-CM | POA: Insufficient documentation

## 2014-11-29 DIAGNOSIS — F209 Schizophrenia, unspecified: Secondary | ICD-10-CM | POA: Insufficient documentation

## 2014-11-29 NOTE — Discharge Instructions (Signed)
Staple Removal, Care After °The staples that were used to close your skin have been removed. The care described here will need to continue until the wound is completely healed and your health care provider confirms that wound care can be stopped. °HOME CARE INSTRUCTIONS  °· Keep the wound site dry and clean. Do not soak it in water. °· If skin adhesive strips were applied after the staples were removed, they will begin to peel off in a few days. Allow them to remain in place until they fall off on their own. °· If you still have a bandage (dressing), change it at least once a day or as directed by your health care provider. If the dressing sticks, pour warm, sterile water over it until it loosens and can be removed without pulling apart the wound edges. Pat dry with a clean towel. °· Apply cream or ointment that stops the growth of bacteria (antibacterial cream or ointment) only if your health care provider has directed you to do so. Place a nonstick bandage over the wound to prevent the dressing from sticking. °· Cover the nonstick bandage with a new dressing as directed by your health care provider. °· If the bandage becomes wet, dirty, or develops a bad smell, change it as soon as possible. °· New scars become sunburned easily. Use sunscreens with a sun protection factor (SPF) of at least 15 when out in the sun. Reapply the SPF every 2 hours. °· Only take medicines as directed by your health care provider. °SEEK IMMEDIATE MEDICAL CARE IF:  °· You have redness, swelling, or increasing pain in the wound. °· You have pus coming from the wound. °· You have a fever. °· You notice a bad smell coming from the wound or dressing. °· Your wound edges open up after staples have been removed. °MAKE SURE YOU:  °· Understand these instructions. °· Will watch your condition. °· Will get help right away if you are not doing well or get worse. °Document Released: 06/27/2008 Document Revised: 07/20/2013 Document Reviewed:  06/27/2008 °[employer]® Patient Information ©2015 [employer]. This information is not intended to replace advice given to you by your health care provider. Make sure you discuss any questions you have with your health care provider. ° °
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
## 2014-11-29 NOTE — ED Notes (Signed)
Pt here for staple removal from head.  Reports were put in 11 days ago.  Denies any complications, says the area is just sore.

## 2014-11-29 NOTE — ED Notes (Signed)
Pt states area is sore.  4 staples removed no redness or swelling noted.

## 2014-12-22 ENCOUNTER — Emergency Department (HOSPITAL_COMMUNITY)
Admission: EM | Admit: 2014-12-22 | Discharge: 2014-12-22 | Disposition: A | Discharge status: Home / Self Care | Payer: Self-pay | Attending: Emergency Medicine | Admitting: Emergency Medicine

## 2014-12-22 ENCOUNTER — Encounter (HOSPITAL_COMMUNITY): Payer: Self-pay | Admitting: Emergency Medicine

## 2014-12-22 DIAGNOSIS — R51 Headache: Secondary | ICD-10-CM

## 2014-12-22 DIAGNOSIS — Z87891 Personal history of nicotine dependence: Secondary | ICD-10-CM | POA: Insufficient documentation

## 2014-12-22 DIAGNOSIS — Z8701 Personal history of pneumonia (recurrent): Secondary | ICD-10-CM | POA: Insufficient documentation

## 2014-12-22 DIAGNOSIS — F209 Schizophrenia, unspecified: Secondary | ICD-10-CM | POA: Insufficient documentation

## 2014-12-22 DIAGNOSIS — F419 Anxiety disorder, unspecified: Secondary | ICD-10-CM | POA: Insufficient documentation

## 2014-12-22 DIAGNOSIS — I10 Essential (primary) hypertension: Secondary | ICD-10-CM | POA: Insufficient documentation

## 2014-12-22 DIAGNOSIS — Z862 Personal history of diseases of the blood and blood-forming organs and certain disorders involving the immune mechanism: Secondary | ICD-10-CM | POA: Insufficient documentation

## 2014-12-22 DIAGNOSIS — Z7951 Long term (current) use of inhaled steroids: Secondary | ICD-10-CM | POA: Insufficient documentation

## 2014-12-22 DIAGNOSIS — F319 Bipolar disorder, unspecified: Secondary | ICD-10-CM | POA: Insufficient documentation

## 2014-12-22 DIAGNOSIS — E780 Pure hypercholesterolemia: Secondary | ICD-10-CM | POA: Insufficient documentation

## 2014-12-22 DIAGNOSIS — Z8669 Personal history of other diseases of the nervous system and sense organs: Secondary | ICD-10-CM | POA: Insufficient documentation

## 2014-12-22 DIAGNOSIS — J45909 Unspecified asthma, uncomplicated: Secondary | ICD-10-CM | POA: Insufficient documentation

## 2014-12-22 DIAGNOSIS — Z791 Long term (current) use of non-steroidal anti-inflammatories (NSAID): Secondary | ICD-10-CM | POA: Insufficient documentation

## 2014-12-22 DIAGNOSIS — Z79899 Other long term (current) drug therapy: Secondary | ICD-10-CM | POA: Insufficient documentation

## 2014-12-22 DIAGNOSIS — K219 Gastro-esophageal reflux disease without esophagitis: Secondary | ICD-10-CM | POA: Insufficient documentation

## 2014-12-22 DIAGNOSIS — G43909 Migraine, unspecified, not intractable, without status migrainosus: Secondary | ICD-10-CM | POA: Insufficient documentation

## 2014-12-22 DIAGNOSIS — R519 Headache, unspecified: Secondary | ICD-10-CM

## 2014-12-22 DIAGNOSIS — E119 Type 2 diabetes mellitus without complications: Secondary | ICD-10-CM | POA: Insufficient documentation

## 2014-12-22 LAB — URINALYSIS, ROUTINE W REFLEX MICROSCOPIC
Bilirubin Urine: NEGATIVE
Glucose, UA: NEGATIVE mg/dL
Hgb urine dipstick: NEGATIVE
Ketones, ur: NEGATIVE mg/dL
LEUKOCYTES UA: NEGATIVE
NITRITE: NEGATIVE
Protein, ur: NEGATIVE mg/dL
SPECIFIC GRAVITY, URINE: 1.015 (ref 1.005–1.030)
Urobilinogen, UA: 0.2 mg/dL (ref 0.0–1.0)
pH: 5 (ref 5.0–8.0)

## 2014-12-22 LAB — BASIC METABOLIC PANEL
ANION GAP: 9 (ref 5–15)
BUN: 10 mg/dL (ref 6–20)
CALCIUM: 9.4 mg/dL (ref 8.9–10.3)
CHLORIDE: 109 mmol/L (ref 101–111)
CO2: 25 mmol/L (ref 22–32)
CREATININE: 0.96 mg/dL (ref 0.44–1.00)
GLUCOSE: 102 mg/dL — AB (ref 65–99)
Potassium: 3.7 mmol/L (ref 3.5–5.1)
SODIUM: 143 mmol/L (ref 135–145)

## 2014-12-22 MED ORDER — SODIUM CHLORIDE 0.9 % IV SOLN
INTRAVENOUS | Status: AC
  Administered 2014-12-22: 10:00:00 via INTRAVENOUS

## 2014-12-22 MED ORDER — HYDROCODONE-ACETAMINOPHEN 5-325 MG PO TABS: 1.0000 | ORAL_TABLET | Freq: Four times a day (QID) | ORAL | Status: DC | PRN

## 2014-12-22 MED ORDER — DIPHENHYDRAMINE HCL 50 MG/ML IJ SOLN
25.0000 mg | Freq: Once | INTRAMUSCULAR | Status: AC
  Administered 2014-12-22: 25 mg via INTRAVENOUS
  Filled 2014-12-22: qty 1

## 2014-12-22 MED ORDER — KETOROLAC TROMETHAMINE 30 MG/ML IJ SOLN
30.0000 mg | Freq: Once | INTRAMUSCULAR | Status: AC
  Administered 2014-12-22: 30 mg via INTRAVENOUS
  Filled 2014-12-22: qty 1

## 2014-12-22 MED ORDER — HYDROMORPHONE HCL 1 MG/ML IJ SOLN
1.0000 mg | Freq: Once | INTRAMUSCULAR | Status: AC
  Administered 2014-12-22: 1 mg via INTRAVENOUS
  Filled 2014-12-22: qty 1

## 2014-12-22 MED ORDER — PROMETHAZINE HCL 25 MG/ML IJ SOLN
12.5000 mg | Freq: Once | INTRAMUSCULAR | Status: AC
  Administered 2014-12-22: 12.5 mg via INTRAVENOUS
  Filled 2014-12-22: qty 1

## 2014-12-22 NOTE — ED Notes (Signed)
Lab draw unsuccessful RN made aware 

## 2014-12-22 NOTE — Discharge Instructions (Signed)
Call the [facility] for follow up.

## 2014-12-22 NOTE — ED Notes (Signed)
EDP notified of BP 

## 2014-12-22 NOTE — ED Notes (Signed)
Nurse at bedside performing IV ulstrasound

## 2014-12-22 NOTE — ED Notes (Signed)
Patricia [name] reports unable to collect enough blood sample for blood draw with IV start.
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
## 2014-12-22 NOTE — ED Notes (Signed)
Pt with Hx of chronic c/o migraine onset today, states migraine cocktail never works for her, pt states that only that "morphine family" medicine works in her IV. Pt states she believes medications is called "Dilantin, Sharma Covert... Something." Pt also c/o SOB, states asthma inhaler did not help her today.

## 2014-12-22 NOTE — ED Notes (Signed)
attepmt made to draw blood in right hand was unable to collect sample.

## 2014-12-22 NOTE — ED Notes (Signed)
Pt reported feeling dizzy during ambulation to restroom with assistance. Pt sat on side of hallway bed until a sara steady was brought to patient.

## 2015-01-17 ENCOUNTER — Emergency Department (HOSPITAL_COMMUNITY)
Admission: EM | Admit: 2015-01-17 | Discharge: 2015-01-17 | Disposition: A | Discharge status: Home / Self Care | Payer: Self-pay | Attending: Emergency Medicine | Admitting: Emergency Medicine

## 2015-01-17 ENCOUNTER — Emergency Department (HOSPITAL_COMMUNITY): Payer: Self-pay

## 2015-01-17 ENCOUNTER — Encounter (HOSPITAL_COMMUNITY): Payer: Self-pay

## 2015-01-17 DIAGNOSIS — Z590 Homelessness: Secondary | ICD-10-CM | POA: Insufficient documentation

## 2015-01-17 DIAGNOSIS — G43909 Migraine, unspecified, not intractable, without status migrainosus: Secondary | ICD-10-CM | POA: Insufficient documentation

## 2015-01-17 DIAGNOSIS — Z7951 Long term (current) use of inhaled steroids: Secondary | ICD-10-CM | POA: Insufficient documentation

## 2015-01-17 DIAGNOSIS — F319 Bipolar disorder, unspecified: Secondary | ICD-10-CM | POA: Insufficient documentation

## 2015-01-17 DIAGNOSIS — Z8701 Personal history of pneumonia (recurrent): Secondary | ICD-10-CM | POA: Insufficient documentation

## 2015-01-17 DIAGNOSIS — R1013 Epigastric pain: Secondary | ICD-10-CM | POA: Insufficient documentation

## 2015-01-17 DIAGNOSIS — R05 Cough: Secondary | ICD-10-CM | POA: Insufficient documentation

## 2015-01-17 DIAGNOSIS — F209 Schizophrenia, unspecified: Secondary | ICD-10-CM | POA: Insufficient documentation

## 2015-01-17 DIAGNOSIS — E780 Pure hypercholesterolemia: Secondary | ICD-10-CM | POA: Insufficient documentation

## 2015-01-17 DIAGNOSIS — E119 Type 2 diabetes mellitus without complications: Secondary | ICD-10-CM | POA: Insufficient documentation

## 2015-01-17 DIAGNOSIS — K219 Gastro-esophageal reflux disease without esophagitis: Secondary | ICD-10-CM | POA: Insufficient documentation

## 2015-01-17 DIAGNOSIS — F419 Anxiety disorder, unspecified: Secondary | ICD-10-CM | POA: Insufficient documentation

## 2015-01-17 DIAGNOSIS — Z87891 Personal history of nicotine dependence: Secondary | ICD-10-CM | POA: Insufficient documentation

## 2015-01-17 DIAGNOSIS — J45909 Unspecified asthma, uncomplicated: Secondary | ICD-10-CM | POA: Insufficient documentation

## 2015-01-17 DIAGNOSIS — R110 Nausea: Secondary | ICD-10-CM | POA: Insufficient documentation

## 2015-01-17 DIAGNOSIS — Z862 Personal history of diseases of the blood and blood-forming organs and certain disorders involving the immune mechanism: Secondary | ICD-10-CM | POA: Insufficient documentation

## 2015-01-17 DIAGNOSIS — R079 Chest pain, unspecified: Secondary | ICD-10-CM | POA: Insufficient documentation

## 2015-01-17 DIAGNOSIS — I10 Essential (primary) hypertension: Secondary | ICD-10-CM | POA: Insufficient documentation

## 2015-01-17 DIAGNOSIS — Z79899 Other long term (current) drug therapy: Secondary | ICD-10-CM | POA: Insufficient documentation

## 2015-01-17 LAB — CBC
HEMATOCRIT: 34.6 % — AB (ref 36.0–46.0)
Hemoglobin: 10.9 g/dL — ABNORMAL LOW (ref 12.0–15.0)
MCH: 23.7 pg — ABNORMAL LOW (ref 26.0–34.0)
MCHC: 31.5 g/dL (ref 30.0–36.0)
MCV: 75.2 fL — ABNORMAL LOW (ref 78.0–100.0)
PLATELETS: 347 10*3/uL (ref 150–400)
RBC: 4.6 MIL/uL (ref 3.87–5.11)
RDW: 17.5 % — AB (ref 11.5–15.5)
WBC: 10 10*3/uL (ref 4.0–10.5)

## 2015-01-17 LAB — BASIC METABOLIC PANEL
ANION GAP: 9 (ref 5–15)
BUN: 11 mg/dL (ref 6–20)
CO2: 25 mmol/L (ref 22–32)
CREATININE: 0.88 mg/dL (ref 0.44–1.00)
Calcium: 9.1 mg/dL (ref 8.9–10.3)
Chloride: 105 mmol/L (ref 101–111)
GFR calc non Af Amer: >60 mL/min (ref >60)
GLUCOSE: 99 mg/dL (ref 65–99)
Potassium: 3.8 mmol/L (ref 3.5–5.1)
Sodium: 139 mmol/L (ref 135–145)

## 2015-01-17 LAB — I-STAT TROPONIN, ED
TROPONIN I, POC: 0.01 ng/mL (ref 0.00–0.08)
TROPONIN I, POC: 0 ng/mL (ref 0.00–0.08)

## 2015-01-17 MED ORDER — ONDANSETRON HCL 4 MG/2ML IJ SOLN
4.0000 mg | Freq: Once | INTRAMUSCULAR | Status: AC
  Administered 2015-01-17: 4 mg via INTRAVENOUS
  Filled 2015-01-17: qty 2

## 2015-01-17 MED ORDER — GI COCKTAIL ~~LOC~~
30.0000 mL | Freq: Once | ORAL | Status: AC
  Administered 2015-01-17: 30 mL via ORAL
  Filled 2015-01-17: qty 30

## 2015-01-17 MED ORDER — IOHEXOL 350 MG/ML SOLN
100.0000 mL | Freq: Once | INTRAVENOUS | Status: AC | PRN
Start: 2015-01-17 — End: 2015-01-17
  Administered 2015-01-17: 100 mL via INTRAVENOUS

## 2015-01-17 MED ORDER — HYDROMORPHONE HCL 1 MG/ML IJ SOLN
1.0000 mg | Freq: Once | INTRAMUSCULAR | Status: AC
  Administered 2015-01-17: 1 mg via INTRAVENOUS
  Filled 2015-01-17: qty 1

## 2015-01-17 MED ORDER — SODIUM CHLORIDE 0.9 % IV BOLUS (SEPSIS)
500.0000 mL | Freq: Once | INTRAVENOUS | Status: AC
  Administered 2015-01-17: 500 mL via INTRAVENOUS
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
## 2015-01-17 NOTE — Discharge Instructions (Signed)

## 2015-01-17 NOTE — ED Notes (Signed)
Pt. Presents with complaint of CP midsternal with radiation to back. Pt. Seen at PCP yesterday for htn evaluation. BP 180/110 initially. Denies SOB. EMS gave 1 nitro and 324 ASA. Denies pain relief from nitro. Pt. With hx of asthma and bronchitis and GERD.

## 2015-01-17 NOTE — ED Notes (Signed)
Attempted to gain IV access x2 for Ct angio with no success. Second RN attempting.

## 2015-01-22 ENCOUNTER — Emergency Department (HOSPITAL_COMMUNITY)
Admission: EM | Admit: 2015-01-22 | Discharge: 2015-01-22 | Disposition: A | Discharge status: Home / Self Care | Payer: Self-pay | Attending: Emergency Medicine | Admitting: Emergency Medicine

## 2015-01-22 ENCOUNTER — Encounter (HOSPITAL_COMMUNITY): Payer: Self-pay

## 2015-01-22 DIAGNOSIS — R05 Cough: Secondary | ICD-10-CM | POA: Insufficient documentation

## 2015-01-22 DIAGNOSIS — Z8701 Personal history of pneumonia (recurrent): Secondary | ICD-10-CM | POA: Insufficient documentation

## 2015-01-22 DIAGNOSIS — Z862 Personal history of diseases of the blood and blood-forming organs and certain disorders involving the immune mechanism: Secondary | ICD-10-CM | POA: Insufficient documentation

## 2015-01-22 DIAGNOSIS — I10 Essential (primary) hypertension: Secondary | ICD-10-CM | POA: Insufficient documentation

## 2015-01-22 DIAGNOSIS — E780 Pure hypercholesterolemia: Secondary | ICD-10-CM | POA: Insufficient documentation

## 2015-01-22 DIAGNOSIS — Z7951 Long term (current) use of inhaled steroids: Secondary | ICD-10-CM | POA: Insufficient documentation

## 2015-01-22 DIAGNOSIS — F319 Bipolar disorder, unspecified: Secondary | ICD-10-CM | POA: Insufficient documentation

## 2015-01-22 DIAGNOSIS — Z79899 Other long term (current) drug therapy: Secondary | ICD-10-CM | POA: Insufficient documentation

## 2015-01-22 DIAGNOSIS — R059 Cough, unspecified: Secondary | ICD-10-CM

## 2015-01-22 DIAGNOSIS — Z87891 Personal history of nicotine dependence: Secondary | ICD-10-CM | POA: Insufficient documentation

## 2015-01-22 DIAGNOSIS — K219 Gastro-esophageal reflux disease without esophagitis: Secondary | ICD-10-CM | POA: Insufficient documentation

## 2015-01-22 DIAGNOSIS — J45909 Unspecified asthma, uncomplicated: Secondary | ICD-10-CM | POA: Insufficient documentation

## 2015-01-22 DIAGNOSIS — F419 Anxiety disorder, unspecified: Secondary | ICD-10-CM | POA: Insufficient documentation

## 2015-01-22 DIAGNOSIS — F209 Schizophrenia, unspecified: Secondary | ICD-10-CM | POA: Insufficient documentation

## 2015-01-22 DIAGNOSIS — G43909 Migraine, unspecified, not intractable, without status migrainosus: Secondary | ICD-10-CM | POA: Insufficient documentation

## 2015-01-22 DIAGNOSIS — E119 Type 2 diabetes mellitus without complications: Secondary | ICD-10-CM | POA: Insufficient documentation

## 2015-01-22 MED ORDER — PREDNISONE 20 MG PO TABS
60.0000 mg | ORAL_TABLET | Freq: Once | ORAL | Status: AC
  Administered 2015-01-22: 60 mg via ORAL
  Filled 2015-01-22: qty 3

## 2015-01-22 MED ORDER — FLUTICASONE PROPIONATE 50 MCG/ACT NA SUSP
1.0000 | Freq: Once | NASAL | Status: AC
  Administered 2015-01-22: 1 via NASAL
  Filled 2015-01-22: qty 16

## 2015-01-22 MED ORDER — PSEUDOEPHEDRINE HCL 30 MG PO TABS: 30.0000 mg | ORAL_TABLET | Freq: Two times a day (BID) | ORAL | Status: AC

## 2015-01-22 MED ORDER — PSEUDOEPHEDRINE HCL 60 MG PO TABS
30.0000 mg | ORAL_TABLET | Freq: Once | ORAL | Status: AC
  Administered 2015-01-22: 30 mg via ORAL
  Filled 2015-01-22: qty 1

## 2015-01-22 MED ORDER — HYDROCODONE-HOMATROPINE 5-1.5 MG/5ML PO SYRP
5.0000 mL | ORAL_SOLUTION | Freq: Once | ORAL | Status: AC
  Administered 2015-01-22: 5 mL via ORAL
  Filled 2015-01-22: qty 5

## 2015-01-22 MED ORDER — PREDNISONE 20 MG PO TABS: 40.0000 mg | ORAL_TABLET | Freq: Every day | ORAL | Status: AC

## 2015-01-22 NOTE — Discharge Instructions (Signed)
As discussed, your evaluation today has been largely reassuring.  But, it is important that you monitor your condition carefully, and do not hesitate to return to the ED if you develop new, or concerning changes in your condition.  Otherwise, please follow-up with your physician for appropriate ongoing care.  For the next 5 days please use the provided inhaler medication, twice daily.  Please take other medication as directed.
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
## 2015-01-22 NOTE — ED Notes (Signed)
Per EMS- Patient states she has had a cough x 1 week, sinus congestion, and rib cage pain from coughing. Lungs clear bilaterally.

## 2015-03-07 ENCOUNTER — Encounter (HOSPITAL_COMMUNITY): Payer: Self-pay | Admitting: Emergency Medicine

## 2015-03-07 ENCOUNTER — Emergency Department (HOSPITAL_COMMUNITY)
Admission: EM | Admit: 2015-03-07 | Discharge: 2015-03-07 | Disposition: A | Discharge status: Home / Self Care | Payer: Self-pay | Attending: Emergency Medicine | Admitting: Emergency Medicine

## 2015-03-07 DIAGNOSIS — F209 Schizophrenia, unspecified: Secondary | ICD-10-CM | POA: Insufficient documentation

## 2015-03-07 DIAGNOSIS — J4521 Mild intermittent asthma with (acute) exacerbation: Secondary | ICD-10-CM | POA: Insufficient documentation

## 2015-03-07 DIAGNOSIS — Z7951 Long term (current) use of inhaled steroids: Secondary | ICD-10-CM | POA: Insufficient documentation

## 2015-03-07 DIAGNOSIS — R059 Cough, unspecified: Secondary | ICD-10-CM

## 2015-03-07 DIAGNOSIS — Z862 Personal history of diseases of the blood and blood-forming organs and certain disorders involving the immune mechanism: Secondary | ICD-10-CM | POA: Insufficient documentation

## 2015-03-07 DIAGNOSIS — R05 Cough: Secondary | ICD-10-CM

## 2015-03-07 DIAGNOSIS — J4520 Mild intermittent asthma, uncomplicated: Secondary | ICD-10-CM

## 2015-03-07 DIAGNOSIS — K219 Gastro-esophageal reflux disease without esophagitis: Secondary | ICD-10-CM | POA: Insufficient documentation

## 2015-03-07 DIAGNOSIS — I10 Essential (primary) hypertension: Secondary | ICD-10-CM | POA: Insufficient documentation

## 2015-03-07 DIAGNOSIS — Z79899 Other long term (current) drug therapy: Secondary | ICD-10-CM | POA: Insufficient documentation

## 2015-03-07 DIAGNOSIS — F419 Anxiety disorder, unspecified: Secondary | ICD-10-CM | POA: Insufficient documentation

## 2015-03-07 DIAGNOSIS — E119 Type 2 diabetes mellitus without complications: Secondary | ICD-10-CM | POA: Insufficient documentation

## 2015-03-07 DIAGNOSIS — G43909 Migraine, unspecified, not intractable, without status migrainosus: Secondary | ICD-10-CM | POA: Insufficient documentation

## 2015-03-07 DIAGNOSIS — Z87891 Personal history of nicotine dependence: Secondary | ICD-10-CM | POA: Insufficient documentation

## 2015-03-07 DIAGNOSIS — Z8701 Personal history of pneumonia (recurrent): Secondary | ICD-10-CM | POA: Insufficient documentation

## 2015-03-07 DIAGNOSIS — E780 Pure hypercholesterolemia: Secondary | ICD-10-CM | POA: Insufficient documentation

## 2015-03-07 DIAGNOSIS — F319 Bipolar disorder, unspecified: Secondary | ICD-10-CM | POA: Insufficient documentation

## 2015-03-07 MED ORDER — HYDROCOD POLST-CPM POLST ER 10-8 MG/5ML PO SUER
5.0000 mL | Freq: Once | ORAL | Status: AC
  Administered 2015-03-07: 5 mL via ORAL
  Filled 2015-03-07: qty 5

## 2015-03-07 MED ORDER — DEXAMETHASONE SODIUM PHOSPHATE 10 MG/ML IJ SOLN
10.0000 mg | Freq: Once | INTRAMUSCULAR | Status: AC
  Administered 2015-03-07: 10 mg via INTRAVENOUS
  Filled 2015-03-07: qty 1

## 2015-03-07 MED ORDER — DIPHENHYDRAMINE HCL 50 MG/ML IJ SOLN
12.5000 mg | Freq: Once | INTRAMUSCULAR | Status: AC
  Administered 2015-03-07: 12.5 mg via INTRAVENOUS
  Filled 2015-03-07: qty 1

## 2015-03-07 MED ORDER — HYDROCODONE-HOMATROPINE 5-1.5 MG/5ML PO SYRP: 5.0000 mL | ORAL_SOLUTION | Freq: Four times a day (QID) | ORAL | Status: DC | PRN

## 2015-03-07 MED ORDER — SODIUM CHLORIDE 0.9 % IV BOLUS (SEPSIS)
500.0000 mL | Freq: Once | INTRAVENOUS | Status: AC
  Administered 2015-03-07: 500 mL via INTRAVENOUS

## 2015-03-07 MED ORDER — PROCHLORPERAZINE EDISYLATE 5 MG/ML IJ SOLN: 10.0000 mg | Freq: Four times a day (QID) | INTRAMUSCULAR | Status: DC | PRN

## 2015-03-07 MED ORDER — KETOROLAC TROMETHAMINE 30 MG/ML IJ SOLN
15.0000 mg | Freq: Once | INTRAMUSCULAR | Status: AC
  Administered 2015-03-07: 15 mg via INTRAVENOUS
  Filled 2015-03-07: qty 1

## 2015-03-07 NOTE — ED Notes (Signed)
Pt ambulatory from triage without difficulty; lungs sounds clear bilaterally; no acute distress noted at present time.
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
## 2015-03-07 NOTE — ED Notes (Signed)
Pt c/o shortness of breath, cough, nausea, vomiting. Pt has history of asthma and bronchitis. Onset of symptoms was three days ago. Pt. Stated she has been using her at home inhalers, and nebulizer. Bilateral breath sounds clear.

## 2015-03-07 NOTE — Discharge Instructions (Signed)

## 2015-03-07 NOTE — ED Notes (Signed)
Pt request "something for cough;" [place] notified.

## 2015-03-19 ENCOUNTER — Encounter (HOSPITAL_COMMUNITY): Payer: Self-pay | Admitting: Family Medicine

## 2015-03-19 ENCOUNTER — Emergency Department (HOSPITAL_COMMUNITY): Payer: Self-pay

## 2015-03-19 ENCOUNTER — Emergency Department (HOSPITAL_COMMUNITY)
Admission: EM | Admit: 2015-03-19 | Discharge: 2015-03-19 | Disposition: A | Discharge status: Home / Self Care | Payer: Self-pay | Attending: Emergency Medicine | Admitting: Emergency Medicine

## 2015-03-19 DIAGNOSIS — R112 Nausea with vomiting, unspecified: Secondary | ICD-10-CM | POA: Insufficient documentation

## 2015-03-19 DIAGNOSIS — E119 Type 2 diabetes mellitus without complications: Secondary | ICD-10-CM | POA: Insufficient documentation

## 2015-03-19 DIAGNOSIS — J45909 Unspecified asthma, uncomplicated: Secondary | ICD-10-CM | POA: Insufficient documentation

## 2015-03-19 DIAGNOSIS — Z79899 Other long term (current) drug therapy: Secondary | ICD-10-CM | POA: Insufficient documentation

## 2015-03-19 DIAGNOSIS — K219 Gastro-esophageal reflux disease without esophagitis: Secondary | ICD-10-CM | POA: Insufficient documentation

## 2015-03-19 DIAGNOSIS — R05 Cough: Secondary | ICD-10-CM | POA: Insufficient documentation

## 2015-03-19 DIAGNOSIS — E780 Pure hypercholesterolemia: Secondary | ICD-10-CM | POA: Insufficient documentation

## 2015-03-19 DIAGNOSIS — F419 Anxiety disorder, unspecified: Secondary | ICD-10-CM | POA: Insufficient documentation

## 2015-03-19 DIAGNOSIS — J0100 Acute maxillary sinusitis, unspecified: Secondary | ICD-10-CM | POA: Insufficient documentation

## 2015-03-19 DIAGNOSIS — Z8701 Personal history of pneumonia (recurrent): Secondary | ICD-10-CM | POA: Insufficient documentation

## 2015-03-19 DIAGNOSIS — Z87891 Personal history of nicotine dependence: Secondary | ICD-10-CM | POA: Insufficient documentation

## 2015-03-19 DIAGNOSIS — Z862 Personal history of diseases of the blood and blood-forming organs and certain disorders involving the immune mechanism: Secondary | ICD-10-CM | POA: Insufficient documentation

## 2015-03-19 DIAGNOSIS — F319 Bipolar disorder, unspecified: Secondary | ICD-10-CM | POA: Insufficient documentation

## 2015-03-19 DIAGNOSIS — Z8669 Personal history of other diseases of the nervous system and sense organs: Secondary | ICD-10-CM | POA: Insufficient documentation

## 2015-03-19 DIAGNOSIS — R059 Cough, unspecified: Secondary | ICD-10-CM

## 2015-03-19 DIAGNOSIS — F209 Schizophrenia, unspecified: Secondary | ICD-10-CM | POA: Insufficient documentation

## 2015-03-19 DIAGNOSIS — Z7951 Long term (current) use of inhaled steroids: Secondary | ICD-10-CM | POA: Insufficient documentation

## 2015-03-19 DIAGNOSIS — I10 Essential (primary) hypertension: Secondary | ICD-10-CM | POA: Insufficient documentation

## 2015-03-19 MED ORDER — AZITHROMYCIN 250 MG PO TABS: 250.0000 mg | ORAL_TABLET | Freq: Every day | ORAL | Status: DC

## 2015-03-19 MED ORDER — OXYMETAZOLINE HCL 0.05 % NA SOLN: 1.0000 | Freq: Two times a day (BID) | NASAL | Status: DC

## 2015-03-19 NOTE — ED Notes (Signed)
Pt states she was seen about one week ago about the same symptoms. Pt has a productive cough, nausea, and vomiting. Denies fever.

## 2015-03-19 NOTE — ED Notes (Signed)
Pt returned from radiology. Pt appears in to be no acute distress with no active coughing.
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
## 2015-03-19 NOTE — Discharge Instructions (Signed)
Read the instructions below on reasons to return to the emergency department and to learn more about your diagnosis.  Use over the counter medications for symptomatic relief as we discussed (mucinex as a decongestant, Tylenol for fever/pain, Motrin/Ibuprofen for muscle aches). If prescribed a cough suppressant during your visit, do not operate heavy machinery with in 5 hours of taking this medication. Followup with your primary care doctor in 4 days if your symptoms persist.  Your more than welcome to return to the emergency department if symptoms worsen or become concerning.  Upper Respiratory Infection, Adult  An upper respiratory infection (URI) is also sometimes known as the common cold. Most people improve within 1 week, but symptoms can last up to 2 weeks. A residual cough may last even longer.   URI is most commonly caused by a virus. Viruses are NOT treated with antibiotics. You can easily spread the virus to others by oral contact. This includes kissing, sharing a glass, coughing, or sneezing. Touching your mouth or nose and then touching a surface, which is then touched by another person, can also spread the virus.   TREATMENT  Treatment is directed at relieving symptoms. There is no cure. Antibiotics are not effective, because the infection is caused by a virus, not by bacteria. Treatment may include:  Increased fluid intake. Sports drinks offer valuable electrolytes, sugars, and fluids.  Breathing heated mist or steam (vaporizer or shower).  Eating chicken soup or other clear broths, and maintaining good nutrition.  Getting plenty of rest.  Using gargles or lozenges for comfort.  Controlling fevers with ibuprofen or acetaminophen as directed by your caregiver.  Increasing usage of your inhaler if you have asthma.  Return to work when your temperature has returned to normal.   SEEK MEDICAL CARE IF:  After the first few days, you feel you are getting worse rather than better.  You  develop worsening shortness of breath, or brown or red sputum. These may be signs of pneumonia.  You develop yellow or brown nasal discharge or pain in the face, especially when you bend forward. These may be signs of sinusitis.  You develop a fever, swollen neck glands, pain with swallowing, or white areas in the back of your throat. These may be signs of strep throat.

## 2015-03-19 NOTE — ED Notes (Signed)
Bed: WA06 Expected date:  Expected time:  Means of arrival:  Comments: Ems-n,v,d

## 2015-03-29 ENCOUNTER — Encounter (HOSPITAL_COMMUNITY): Payer: Self-pay | Admitting: *Deleted

## 2015-03-29 ENCOUNTER — Emergency Department (HOSPITAL_COMMUNITY)
Admission: EM | Admit: 2015-03-29 | Discharge: 2015-03-30 | Disposition: A | Discharge status: Home / Self Care | Payer: Self-pay | Attending: Emergency Medicine | Admitting: Emergency Medicine

## 2015-03-29 DIAGNOSIS — Z8701 Personal history of pneumonia (recurrent): Secondary | ICD-10-CM | POA: Insufficient documentation

## 2015-03-29 DIAGNOSIS — Z79899 Other long term (current) drug therapy: Secondary | ICD-10-CM | POA: Insufficient documentation

## 2015-03-29 DIAGNOSIS — F315 Bipolar disorder, current episode depressed, severe, with psychotic features: Secondary | ICD-10-CM | POA: Insufficient documentation

## 2015-03-29 DIAGNOSIS — J45909 Unspecified asthma, uncomplicated: Secondary | ICD-10-CM | POA: Insufficient documentation

## 2015-03-29 DIAGNOSIS — E780 Pure hypercholesterolemia: Secondary | ICD-10-CM | POA: Insufficient documentation

## 2015-03-29 DIAGNOSIS — Z3202 Encounter for pregnancy test, result negative: Secondary | ICD-10-CM | POA: Insufficient documentation

## 2015-03-29 DIAGNOSIS — E119 Type 2 diabetes mellitus without complications: Secondary | ICD-10-CM | POA: Insufficient documentation

## 2015-03-29 DIAGNOSIS — J309 Allergic rhinitis, unspecified: Secondary | ICD-10-CM

## 2015-03-29 DIAGNOSIS — I10 Essential (primary) hypertension: Secondary | ICD-10-CM | POA: Insufficient documentation

## 2015-03-29 DIAGNOSIS — K219 Gastro-esophageal reflux disease without esophagitis: Secondary | ICD-10-CM | POA: Insufficient documentation

## 2015-03-29 DIAGNOSIS — F419 Anxiety disorder, unspecified: Secondary | ICD-10-CM | POA: Insufficient documentation

## 2015-03-29 DIAGNOSIS — F1210 Cannabis abuse, uncomplicated: Secondary | ICD-10-CM | POA: Insufficient documentation

## 2015-03-29 DIAGNOSIS — H538 Other visual disturbances: Secondary | ICD-10-CM | POA: Insufficient documentation

## 2015-03-29 DIAGNOSIS — R253 Fasciculation: Secondary | ICD-10-CM | POA: Insufficient documentation

## 2015-03-29 DIAGNOSIS — Z862 Personal history of diseases of the blood and blood-forming organs and certain disorders involving the immune mechanism: Secondary | ICD-10-CM | POA: Insufficient documentation

## 2015-03-29 DIAGNOSIS — Z87891 Personal history of nicotine dependence: Secondary | ICD-10-CM | POA: Insufficient documentation

## 2015-03-29 DIAGNOSIS — Z7951 Long term (current) use of inhaled steroids: Secondary | ICD-10-CM | POA: Insufficient documentation

## 2015-03-29 DIAGNOSIS — J329 Chronic sinusitis, unspecified: Secondary | ICD-10-CM | POA: Insufficient documentation

## 2015-03-29 LAB — BASIC METABOLIC PANEL
ANION GAP: 8 (ref 5–15)
BUN: 10 mg/dL (ref 6–20)
CHLORIDE: 109 mmol/L (ref 101–111)
CO2: 23 mmol/L (ref 22–32)
Calcium: 9.4 mg/dL (ref 8.9–10.3)
Creatinine, Ser: 0.99 mg/dL (ref 0.44–1.00)
GFR calc Af Amer: >60 mL/min (ref >60)
Glucose, Bld: 94 mg/dL (ref 65–99)
POTASSIUM: 3.7 mmol/L (ref 3.5–5.1)
SODIUM: 140 mmol/L (ref 135–145)

## 2015-03-29 LAB — CBC
HEMATOCRIT: 34.7 % — AB (ref 36.0–46.0)
HEMOGLOBIN: 10.9 g/dL — AB (ref 12.0–15.0)
MCH: 24.3 pg — ABNORMAL LOW (ref 26.0–34.0)
MCHC: 31.4 g/dL (ref 30.0–36.0)
MCV: 77.3 fL — AB (ref 78.0–100.0)
PLATELETS: 329 10*3/uL (ref 150–400)
RBC: 4.49 MIL/uL (ref 3.87–5.11)
RDW: 16.3 % — ABNORMAL HIGH (ref 11.5–15.5)
WBC: 6.3 10*3/uL (ref 4.0–10.5)

## 2015-03-29 LAB — APTT: aPTT: 35 seconds (ref 24–37)

## 2015-03-29 LAB — PROTIME-INR
INR: 1.02 (ref 0.00–1.49)
PROTHROMBIN TIME: 13.6 s (ref 11.6–15.2)

## 2015-03-29 MED ORDER — BENZONATATE 100 MG PO CAPS
200.0000 mg | ORAL_CAPSULE | Freq: Once | ORAL | Status: AC
  Administered 2015-03-29: 200 mg via ORAL
  Filled 2015-03-29: qty 2

## 2015-03-29 MED ORDER — DEXAMETHASONE SODIUM PHOSPHATE 10 MG/ML IJ SOLN
10.0000 mg | Freq: Once | INTRAMUSCULAR | Status: AC
  Administered 2015-03-29: 10 mg via INTRAMUSCULAR
  Filled 2015-03-29: qty 1

## 2015-03-29 MED ORDER — LORATADINE 10 MG PO TABS
10.0000 mg | ORAL_TABLET | Freq: Once | ORAL | Status: AC
  Administered 2015-03-29: 10 mg via ORAL
  Filled 2015-03-29: qty 1

## 2015-03-29 NOTE — ED Notes (Signed)
Pt reassessed. No neuro deficits. Pt reports continued headache and bilateral blurred vision. Pt reported facial twitching on the left side. Pt no facial droop noted.
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
## 2015-03-29 NOTE — ED Notes (Addendum)
Per EMS- pt was sent here for headache and blurred vision. Pt recently finished antibiotics for sinus infection. Bp194/142. No neuro deficits. Denies chest pain/ SOB. Pt also reports continued productive cough and nasal congestion.

## 2015-03-29 NOTE — ED Notes (Signed)
Family at bedside. 

## 2015-03-30 ENCOUNTER — Emergency Department (HOSPITAL_COMMUNITY): Payer: Self-pay

## 2015-03-30 LAB — URINALYSIS, ROUTINE W REFLEX MICROSCOPIC
Bilirubin Urine: NEGATIVE
GLUCOSE, UA: NEGATIVE mg/dL
HGB URINE DIPSTICK: NEGATIVE
Ketones, ur: NEGATIVE mg/dL
LEUKOCYTES UA: NEGATIVE
Nitrite: NEGATIVE
PROTEIN: NEGATIVE mg/dL
SPECIFIC GRAVITY, URINE: 1.017 (ref 1.005–1.030)
UROBILINOGEN UA: 0.2 mg/dL (ref 0.0–1.0)
pH: 6 (ref 5.0–8.0)

## 2015-03-30 LAB — POC URINE PREG, ED: PREG TEST UR: NEGATIVE

## 2015-03-30 LAB — RAPID URINE DRUG SCREEN, HOSP PERFORMED
AMPHETAMINES: NOT DETECTED
BENZODIAZEPINES: NOT DETECTED
Barbiturates: NOT DETECTED
Cocaine: NOT DETECTED
OPIATES: NOT DETECTED
Tetrahydrocannabinol: POSITIVE — AB

## 2015-03-30 MED ORDER — CETIRIZINE HCL 10 MG PO CAPS: 10.0000 mg | ORAL_CAPSULE | Freq: Once | ORAL | Status: DC

## 2015-03-30 MED ORDER — KETOROLAC TROMETHAMINE 30 MG/ML IJ SOLN
30.0000 mg | Freq: Once | INTRAMUSCULAR | Status: AC
  Administered 2015-03-30: 30 mg via INTRAVENOUS
  Filled 2015-03-30: qty 1

## 2015-03-30 MED ORDER — GUAIFENESIN 100 MG/5ML PO LIQD: 100.0000 mg | ORAL | Status: DC | PRN

## 2015-03-30 MED ORDER — METOCLOPRAMIDE HCL 5 MG/ML IJ SOLN
10.0000 mg | Freq: Once | INTRAMUSCULAR | Status: AC
  Administered 2015-03-30: 10 mg via INTRAVENOUS
  Filled 2015-03-30: qty 2

## 2015-03-30 NOTE — Discharge Instructions (Signed)
We saw you in the ER for the headache, elevated blood pressure, and vision complains and twitching. All the results in the ER are normal, labs and imaging. The twitching has resolved. The cough appears from allergic sinusitis. We are not sure what is causing your symptoms with the vision - see the eye doctor as requested. The workup in the ER is not complete, and is limited to screening for life threatening and emergent conditions only, so please see a primary care doctor for further evaluation.  Please return to the ER if your symptoms worsen; you have increased pain, fevers, chills, inability to keep any medications down, confusion. Otherwise see the outpatient doctor as requested.    Allergic Rhinitis Allergic rhinitis is when the mucous membranes in the nose respond to allergens. Allergens are particles in the air that cause your body to have an allergic reaction. This causes you to release allergic antibodies. Through a chain of events, these eventually cause you to release histamine into the blood stream. Although meant to protect the body, it is this release of histamine that causes your discomfort, such as frequent sneezing, congestion, and an itchy, runny nose.  CAUSES  Seasonal allergic rhinitis (hay fever) is caused by pollen allergens that may come from grasses, trees, and weeds. Year-round allergic rhinitis (perennial allergic rhinitis) is caused by allergens such as house dust mites, pet dander, and mold spores.  SYMPTOMS   Nasal stuffiness (congestion).  Itchy, runny nose with sneezing and tearing of the eyes. DIAGNOSIS  Your health care provider can help you determine the allergen or allergens that trigger your symptoms. If you and your health care provider are unable to determine the allergen, skin or blood testing may be used. TREATMENT  Allergic rhinitis does not have a cure, but it can be controlled by:  Medicines and allergy shots (immunotherapy).  Avoiding the  allergen. Hay fever may often be treated with antihistamines in pill or nasal spray forms. Antihistamines block the effects of histamine. There are over-the-counter medicines that may help with nasal congestion and swelling around the eyes. Check with your health care provider before taking or giving this medicine.  If avoiding the allergen or the medicine prescribed do not work, there are many new medicines your health care provider can prescribe. Stronger medicine may be used if initial measures are ineffective. Desensitizing injections can be used if medicine and avoidance does not work. Desensitization is when a patient is given ongoing shots until the body becomes less sensitive to the allergen. Make sure you follow up with your health care provider if problems continue. HOME CARE INSTRUCTIONS It is not possible to completely avoid allergens, but you can reduce your symptoms by taking steps to limit your exposure to them. It helps to know exactly what you are allergic to so that you can avoid your specific triggers. SEEK MEDICAL CARE IF:   You have a fever.  You develop a cough that does not stop easily (persistent).  You have shortness of breath.  You start wheezing.  Symptoms interfere with normal daily activities. Document Released: 04/09/2001 Document Revised: 07/20/2013 Document Reviewed: 03/22/2013 [facility] Patient Information 2015 [place], [place]. This information is not intended to replace advice given to you by your health care provider. Make sure you discuss any questions you have with your health care provider.  Hypertension Hypertension, commonly called high blood pressure, is when the force of blood pumping through your arteries is too strong. Your arteries are the blood vessels that carry  blood from your heart throughout your body. A blood pressure reading consists of a higher number over a lower number, such as 110/72. The higher number (systolic) is the pressure inside your  arteries when your heart pumps. The lower number (diastolic) is the pressure inside your arteries when your heart relaxes. Ideally you want your blood pressure below 120/80. Hypertension forces your heart to work harder to pump blood. Your arteries may become narrow or stiff. Having hypertension puts you at risk for heart disease, stroke, and other problems.  RISK FACTORS Some risk factors for high blood pressure are controllable. Others are not.  Risk factors you cannot control include:   Race. You may be at higher risk if you are African American.  Age. Risk increases with age.  Gender. Men are at higher risk than women before age 71 years. After age 33, women are at higher risk than men. Risk factors you can control include:  Not getting enough exercise or physical activity.  Being overweight.  Getting too much fat, sugar, calories, or salt in your diet.  Drinking too much alcohol. SIGNS AND SYMPTOMS Hypertension does not usually cause signs or symptoms. Extremely high blood pressure (hypertensive crisis) may cause headache, anxiety, shortness of breath, and nosebleed. DIAGNOSIS  To check if you have hypertension, your health care provider will measure your blood pressure while you are seated, with your arm held at the level of your heart. It should be measured at least twice using the same arm. Certain conditions can cause a difference in blood pressure between your right and left arms. A blood pressure reading that is higher than normal on one occasion does not mean that you need treatment. If one blood pressure reading is high, ask your health care provider about having it checked again. TREATMENT  Treating high blood pressure includes making lifestyle changes and possibly taking medicine. Living a healthy lifestyle can help lower high blood pressure. You may need to change some of your habits. Lifestyle changes may include:  Following the DASH diet. This diet is high in fruits,  vegetables, and whole grains. It is low in salt, red meat, and added sugars.  Getting at least 2 hours of brisk physical activity every week.  Losing weight if necessary.  Not smoking.  Limiting alcoholic beverages.  Learning ways to reduce stress. If lifestyle changes are not enough to get your blood pressure under control, your health care provider may prescribe medicine. You may need to take more than one. Work closely with your health care provider to understand the risks and benefits. HOME CARE INSTRUCTIONS  Have your blood pressure rechecked as directed by your health care provider.   Take medicines only as directed by your health care provider. Follow the directions carefully. Blood pressure medicines must be taken as prescribed. The medicine does not work as well when you skip doses. Skipping doses also puts you at risk for problems.   Do not smoke.   Monitor your blood pressure at home as directed by your health care provider. SEEK MEDICAL CARE IF:   You think you are having a reaction to medicines taken.  You have recurrent headaches or feel dizzy.  You have swelling in your ankles.  You have trouble with your vision. SEEK IMMEDIATE MEDICAL CARE IF:  You develop a severe headache or confusion.  You have unusual weakness, numbness, or feel faint.  You have severe chest or abdominal pain.  You vomit repeatedly.  You have trouble breathing. MAKE  SURE YOU:   Understand these instructions.  Will watch your condition.  Will get help right away if you are not doing well or get worse. Document Released: 07/15/2005 Document Revised: 11/29/2013 Document Reviewed: 05/07/2013 [facility] Patient Information 2015 [place], [place]. This information is not intended to replace advice given to you by your health care provider. Make sure you discuss any questions you have with your health care provider.  Visual Disturbances You have had a disturbance in your vision. This  may be caused by various conditions, such as:  Migraines. Migraine headaches are often preceded by a disturbance in vision. Blind spots or light flashes are followed by a headache. This type of visual disturbance is temporary. It does not damage the eye.  Glaucoma. This is caused by increased pressure in the eye. Symptoms include haziness, blurred vision, or seeing rainbow colored circles when looking at bright lights. Partial or complete visual loss can occur. You may or may not experience eye pain. Visual loss may be gradual or sudden and is irreversible. Glaucoma is the leading cause of blindness.  Retina problems. Vision will be reduced if the retina becomes detached or if there is a circulation problem as with diabetes, high blood pressure, or a mini-stroke. Symptoms include seeing "floaters," flashes of light, or shadows, as if a curtain has fallen over your eye.  Optic nerve problems. The main nerve in your eye can be damaged by redness, soreness, and swelling (inflammation), poor circulation, drugs, and toxins. It is very important to have a complete exam done by a specialist to determine the exact cause of your eye problem. The specialist may recommend medicines or surgery, depending on the cause of the problem. This can help prevent further loss of vision or reduce the risk of having a stroke. Contact the caregiver to whom you have been referred and arrange for follow-up care right away. SEEK IMMEDIATE MEDICAL CARE IF:   Your vision gets worse.  You develop severe headaches.  You have any weakness or numbness in the face, arms, or legs.  You have any trouble speaking or walking. Document Released: 08/22/2004 Document Revised: 10/07/2011 Document Reviewed: 12/13/2009 [facility] Patient Information 2015 [place], [place]. This information is not intended to replace advice given to you by your health care provider. Make sure you discuss any questions you have with your health care  provider.

## 2015-03-30 NOTE — ED Notes (Signed)
Patient is alert and orientedx4.  Patient was explained discharge instructions and they understood them with no questions.   

## 2015-05-26 ENCOUNTER — Other Ambulatory Visit (HOSPITAL_COMMUNITY): Payer: Self-pay | Admitting: Otolaryngology

## 2015-05-26 DIAGNOSIS — R053 Chronic cough: Secondary | ICD-10-CM

## 2015-05-26 DIAGNOSIS — R05 Cough: Secondary | ICD-10-CM

## 2015-05-31 ENCOUNTER — Ambulatory Visit (HOSPITAL_COMMUNITY): Payer: Self-pay

## 2015-06-05 ENCOUNTER — Ambulatory Visit (HOSPITAL_COMMUNITY)
Admission: RE | Admit: 2015-06-05 | Discharge: 2015-06-05 | Disposition: A | Discharge status: Home / Self Care | Payer: Self-pay | Source: Ambulatory Visit | Attending: Otolaryngology | Admitting: Otolaryngology

## 2015-06-05 DIAGNOSIS — R053 Chronic cough: Secondary | ICD-10-CM

## 2015-06-05 DIAGNOSIS — R0602 Shortness of breath: Secondary | ICD-10-CM | POA: Insufficient documentation

## 2015-06-05 DIAGNOSIS — J45909 Unspecified asthma, uncomplicated: Secondary | ICD-10-CM | POA: Insufficient documentation

## 2015-06-05 DIAGNOSIS — R600 Localized edema: Secondary | ICD-10-CM | POA: Insufficient documentation

## 2015-06-05 DIAGNOSIS — Q758 Other specified congenital malformations of skull and face bones: Secondary | ICD-10-CM | POA: Insufficient documentation

## 2015-06-05 DIAGNOSIS — R05 Cough: Secondary | ICD-10-CM | POA: Insufficient documentation

## 2015-08-27 ENCOUNTER — Emergency Department (HOSPITAL_COMMUNITY)
Admission: EM | Admit: 2015-08-27 | Discharge: 2015-08-28 | Disposition: A | Discharge status: Home / Self Care | Payer: Self-pay | Attending: Emergency Medicine | Admitting: Emergency Medicine

## 2015-08-27 ENCOUNTER — Emergency Department (HOSPITAL_COMMUNITY): Payer: Self-pay

## 2015-08-27 ENCOUNTER — Encounter (HOSPITAL_COMMUNITY): Payer: Self-pay | Admitting: Oncology

## 2015-08-27 DIAGNOSIS — J45909 Unspecified asthma, uncomplicated: Secondary | ICD-10-CM | POA: Insufficient documentation

## 2015-08-27 DIAGNOSIS — F319 Bipolar disorder, unspecified: Secondary | ICD-10-CM | POA: Insufficient documentation

## 2015-08-27 DIAGNOSIS — S0990XA Unspecified injury of head, initial encounter: Secondary | ICD-10-CM | POA: Insufficient documentation

## 2015-08-27 DIAGNOSIS — S4992XA Unspecified injury of left shoulder and upper arm, initial encounter: Secondary | ICD-10-CM | POA: Insufficient documentation

## 2015-08-27 DIAGNOSIS — Z0471 Encounter for examination and observation following alleged adult physical abuse: Secondary | ICD-10-CM | POA: Insufficient documentation

## 2015-08-27 DIAGNOSIS — S199XXA Unspecified injury of neck, initial encounter: Secondary | ICD-10-CM | POA: Insufficient documentation

## 2015-08-27 DIAGNOSIS — E119 Type 2 diabetes mellitus without complications: Secondary | ICD-10-CM | POA: Insufficient documentation

## 2015-08-27 DIAGNOSIS — Z8701 Personal history of pneumonia (recurrent): Secondary | ICD-10-CM | POA: Insufficient documentation

## 2015-08-27 DIAGNOSIS — Z87891 Personal history of nicotine dependence: Secondary | ICD-10-CM | POA: Insufficient documentation

## 2015-08-27 DIAGNOSIS — Z862 Personal history of diseases of the blood and blood-forming organs and certain disorders involving the immune mechanism: Secondary | ICD-10-CM | POA: Insufficient documentation

## 2015-08-27 DIAGNOSIS — I10 Essential (primary) hypertension: Secondary | ICD-10-CM | POA: Insufficient documentation

## 2015-08-27 DIAGNOSIS — Y9289 Other specified places as the place of occurrence of the external cause: Secondary | ICD-10-CM | POA: Insufficient documentation

## 2015-08-27 DIAGNOSIS — Y9389 Activity, other specified: Secondary | ICD-10-CM | POA: Insufficient documentation

## 2015-08-27 DIAGNOSIS — S0993XA Unspecified injury of face, initial encounter: Secondary | ICD-10-CM | POA: Insufficient documentation

## 2015-08-27 DIAGNOSIS — F419 Anxiety disorder, unspecified: Secondary | ICD-10-CM | POA: Insufficient documentation

## 2015-08-27 DIAGNOSIS — F209 Schizophrenia, unspecified: Secondary | ICD-10-CM | POA: Insufficient documentation

## 2015-08-27 DIAGNOSIS — K219 Gastro-esophageal reflux disease without esophagitis: Secondary | ICD-10-CM | POA: Insufficient documentation

## 2015-08-27 DIAGNOSIS — Y998 Other external cause status: Secondary | ICD-10-CM | POA: Insufficient documentation

## 2015-08-27 DIAGNOSIS — Z79899 Other long term (current) drug therapy: Secondary | ICD-10-CM | POA: Insufficient documentation

## 2015-08-27 DIAGNOSIS — G43909 Migraine, unspecified, not intractable, without status migrainosus: Secondary | ICD-10-CM | POA: Insufficient documentation

## 2015-08-27 LAB — I-STAT CHEM 8, ED
BUN: 15 mg/dL (ref 6–20)
CHLORIDE: 107 mmol/L (ref 101–111)
CREATININE: 0.9 mg/dL (ref 0.44–1.00)
Calcium, Ion: 1.16 mmol/L (ref 1.12–1.23)
Glucose, Bld: 93 mg/dL (ref 65–99)
HEMATOCRIT: 42 % (ref 36.0–46.0)
HEMOGLOBIN: 14.3 g/dL (ref 12.0–15.0)
POTASSIUM: 3.7 mmol/L (ref 3.5–5.1)
Sodium: 142 mmol/L (ref 135–145)
TCO2: 27 mmol/L (ref 0–100)

## 2015-08-27 MED ORDER — FENTANYL CITRATE (PF) 100 MCG/2ML IJ SOLN
100.0000 ug | Freq: Once | INTRAMUSCULAR | Status: AC
  Administered 2015-08-27: 100 ug via INTRAVENOUS
  Filled 2015-08-27: qty 2

## 2015-08-27 MED ORDER — SODIUM CHLORIDE 0.9 % IV BOLUS (SEPSIS)
1000.0000 mL | Freq: Once | INTRAVENOUS | Status: AC
  Administered 2015-08-27: 1000 mL via INTRAVENOUS

## 2015-08-27 MED ORDER — IOHEXOL 350 MG/ML SOLN
100.0000 mL | Freq: Once | INTRAVENOUS | Status: AC | PRN
  Administered 2015-08-27: 100 mL via INTRAVENOUS
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
## 2015-08-28 MED ORDER — KETOROLAC TROMETHAMINE 30 MG/ML IJ SOLN
30.0000 mg | Freq: Once | INTRAMUSCULAR | Status: AC
  Administered 2015-08-28: 30 mg via INTRAVENOUS
  Filled 2015-08-28: qty 1

## 2015-08-28 MED ORDER — OXYCODONE-ACETAMINOPHEN 5-325 MG PO TABS
2.0000 | ORAL_TABLET | Freq: Once | ORAL | Status: AC
  Administered 2015-08-28: 2 via ORAL
  Filled 2015-08-28: qty 2

## 2015-08-28 MED ORDER — DIPHENHYDRAMINE HCL 25 MG PO CAPS
25.0000 mg | ORAL_CAPSULE | Freq: Once | ORAL | Status: AC
  Administered 2015-08-28: 25 mg via ORAL
  Filled 2015-08-28: qty 1

## 2015-08-28 MED ORDER — OXYCODONE-ACETAMINOPHEN 5-325 MG PO TABS: 1.0000 | ORAL_TABLET | Freq: Three times a day (TID) | ORAL | Status: DC | PRN
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
## 2015-08-28 NOTE — ED Notes (Signed)
Per Sharrie Rothman Ward night shift nurse discharge complete; waiting for social work consult for resources and shelter placement.
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
## 2015-08-28 NOTE — ED Notes (Signed)
Bed: [id number] Expected date:  Expected time:  Means of arrival:  Comments: Pt still in room/waiting on social work for resources.
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
## 2015-11-16 IMAGING — CR DG ABDOMEN 2V
4 series · 4 of 4 positions shown · non-contrast
Comparison: 07/25/2014

CLINICAL DATA: Ileus following gastrointestinal surgery 07/19/2014.

EXAM:
ABDOMEN - 2 VIEW

[view not recorded (1 of 4)]
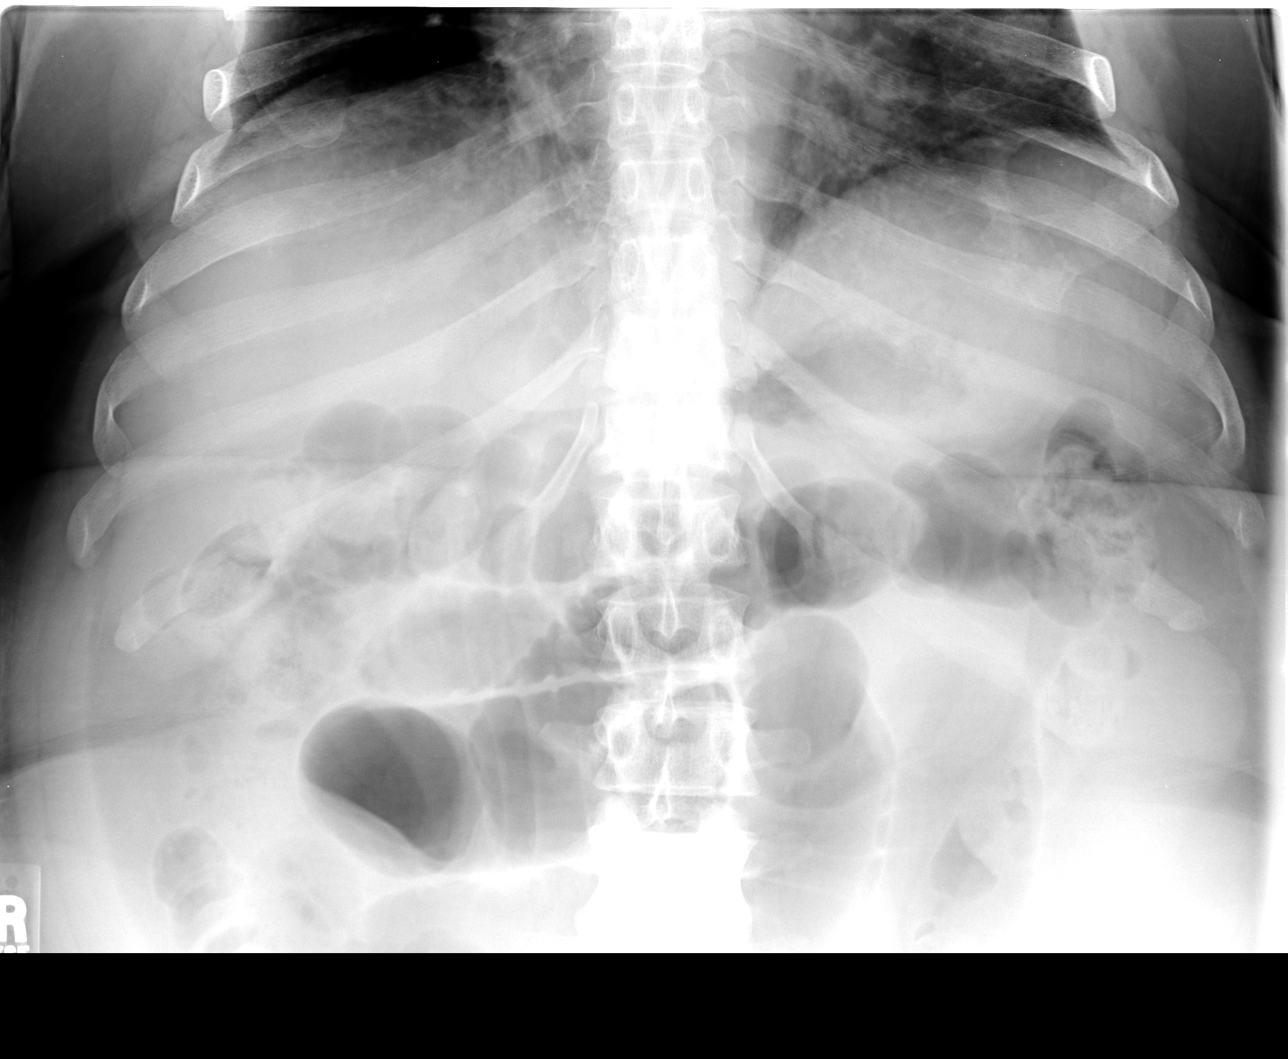

[view not recorded (2 of 4)]
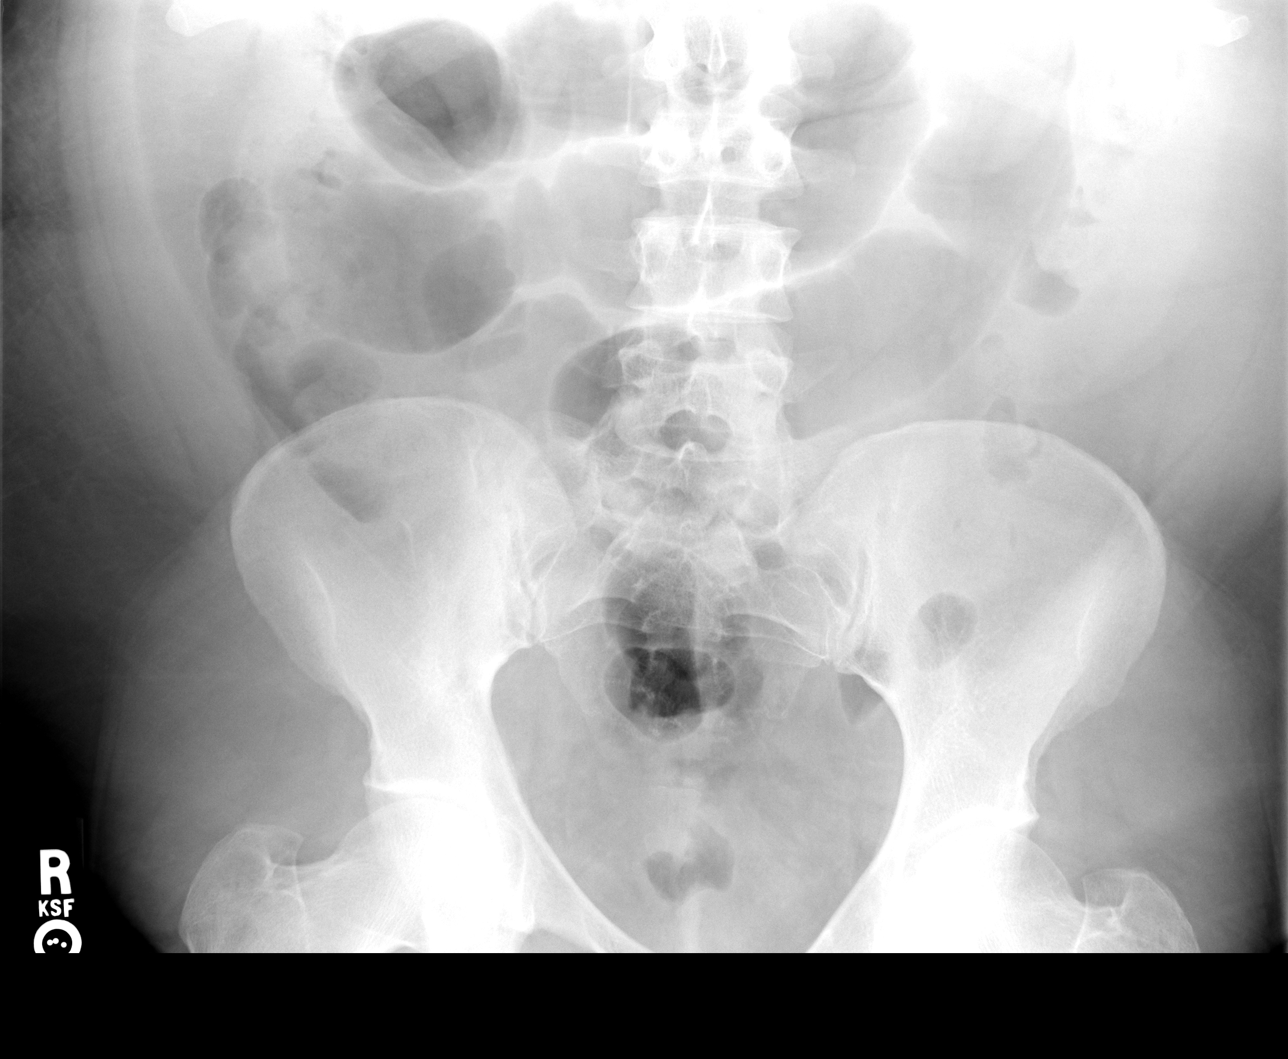

[view not recorded (3 of 4)]
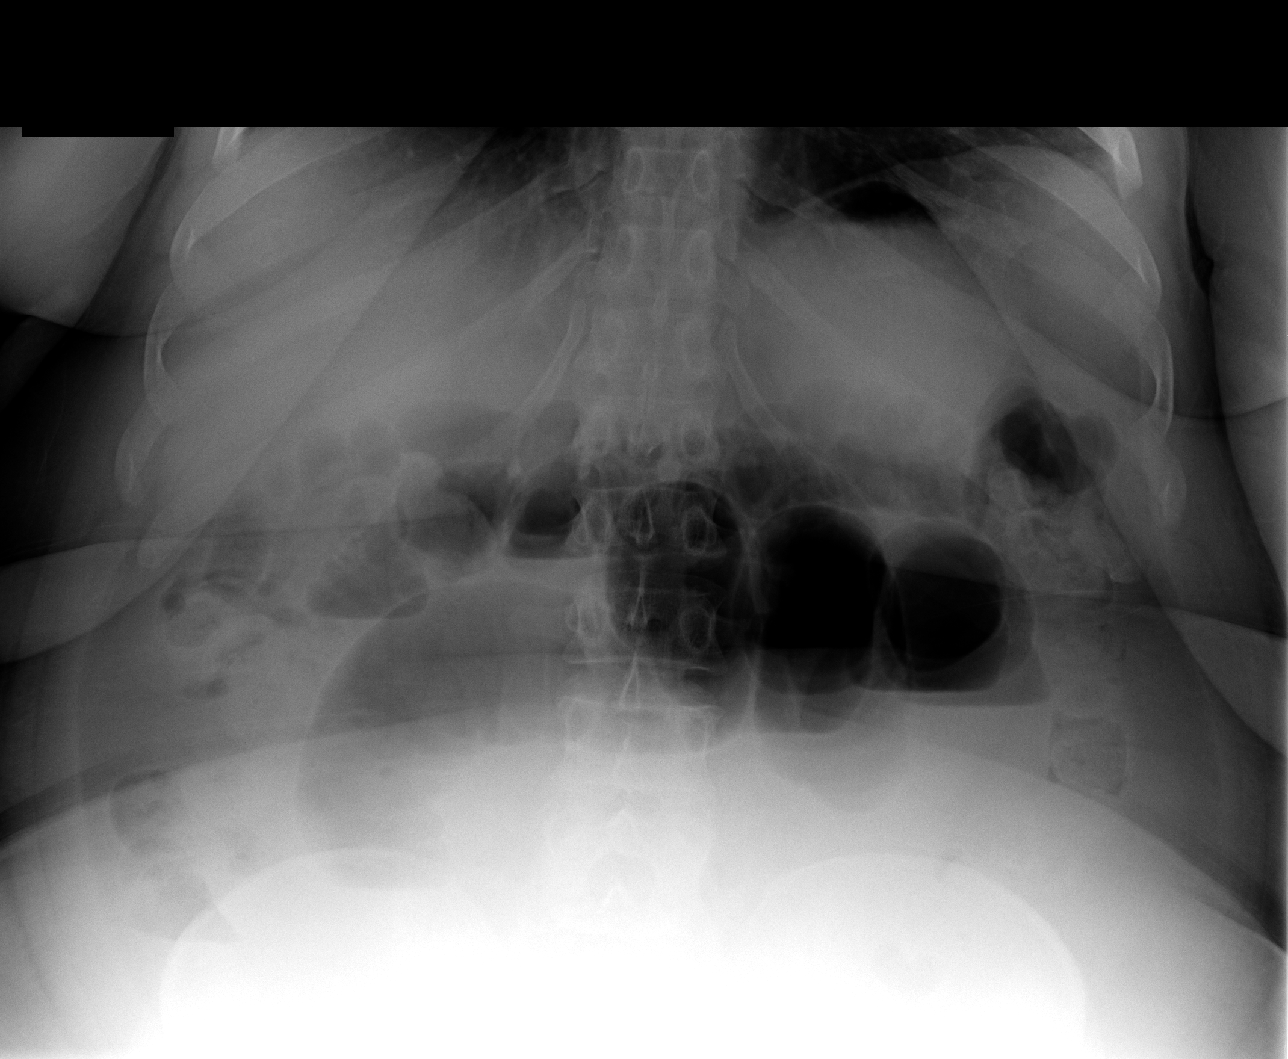

[view not recorded (4 of 4)]
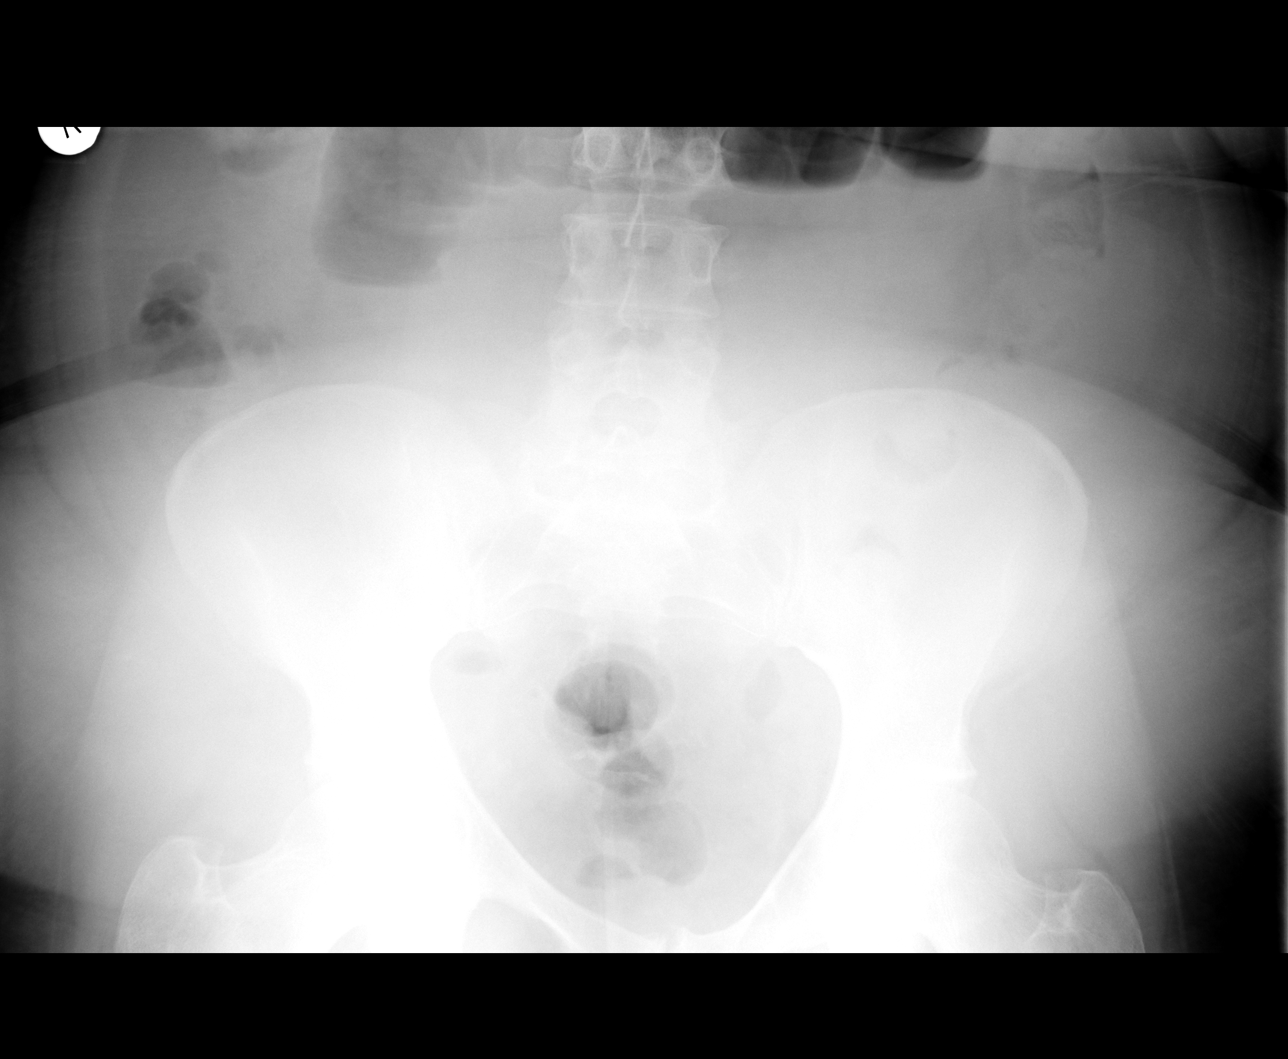

[4 of 4 positions shown; findings below may reference images not displayed]

FINDINGS: No intraperitoneal free air is identified. Moderately dilated small
bowel loops in the central abdomen measure up to approximately 6 cm
in diameter, overall not significantly changed from the prior study.
Air-fluid levels are again seen. Gas and a small amount of stool are
present throughout the colon, and a small amount of gas is present
in the rectum. No abnormal soft tissue calcification or acute
osseous abnormality is identified. Minimal opacity is again seen in
the left lung base, incompletely evaluated but may reflect
subsegmental atelectasis.
IMPRESSION: Unchanged small bowel dilatation, compatible with postoperative
ileus although partial obstruction is not excluded.

## 2016-01-18 ENCOUNTER — Encounter (HOSPITAL_COMMUNITY): Payer: Self-pay | Admitting: Emergency Medicine

## 2016-01-18 ENCOUNTER — Other Ambulatory Visit: Payer: Self-pay

## 2016-01-18 ENCOUNTER — Emergency Department (HOSPITAL_COMMUNITY)
Admission: EM | Admit: 2016-01-18 | Discharge: 2016-01-18 | Disposition: A | Discharge status: Home / Self Care | Payer: Self-pay | Attending: Emergency Medicine | Admitting: Emergency Medicine

## 2016-01-18 DIAGNOSIS — Z791 Long term (current) use of non-steroidal anti-inflammatories (NSAID): Secondary | ICD-10-CM | POA: Insufficient documentation

## 2016-01-18 DIAGNOSIS — I10 Essential (primary) hypertension: Secondary | ICD-10-CM | POA: Insufficient documentation

## 2016-01-18 DIAGNOSIS — Z79899 Other long term (current) drug therapy: Secondary | ICD-10-CM | POA: Insufficient documentation

## 2016-01-18 DIAGNOSIS — G43909 Migraine, unspecified, not intractable, without status migrainosus: Secondary | ICD-10-CM

## 2016-01-18 DIAGNOSIS — F259 Schizoaffective disorder, unspecified: Secondary | ICD-10-CM | POA: Insufficient documentation

## 2016-01-18 DIAGNOSIS — J45909 Unspecified asthma, uncomplicated: Secondary | ICD-10-CM | POA: Insufficient documentation

## 2016-01-18 DIAGNOSIS — E119 Type 2 diabetes mellitus without complications: Secondary | ICD-10-CM | POA: Insufficient documentation

## 2016-01-18 DIAGNOSIS — Z87891 Personal history of nicotine dependence: Secondary | ICD-10-CM | POA: Insufficient documentation

## 2016-01-18 DIAGNOSIS — G43919 Migraine, unspecified, intractable, without status migrainosus: Secondary | ICD-10-CM | POA: Insufficient documentation

## 2016-01-18 DIAGNOSIS — F319 Bipolar disorder, unspecified: Secondary | ICD-10-CM | POA: Insufficient documentation

## 2016-01-18 LAB — I-STAT BETA HCG BLOOD, ED (MC, WL, AP ONLY)

## 2016-01-18 MED ORDER — KETOROLAC TROMETHAMINE 30 MG/ML IJ SOLN
30.0000 mg | Freq: Once | INTRAMUSCULAR | Status: AC
  Administered 2016-01-18: 30 mg via INTRAVENOUS
  Filled 2016-01-18: qty 1

## 2016-01-18 MED ORDER — DIPHENHYDRAMINE HCL 50 MG/ML IJ SOLN
25.0000 mg | Freq: Once | INTRAMUSCULAR | Status: AC
  Administered 2016-01-18: 25 mg via INTRAVENOUS
  Filled 2016-01-18: qty 1

## 2016-01-18 MED ORDER — SODIUM CHLORIDE 0.9 % IV BOLUS (SEPSIS)
1000.0000 mL | Freq: Once | INTRAVENOUS | Status: AC
  Administered 2016-01-18: 1000 mL via INTRAVENOUS

## 2016-01-18 MED ORDER — PROMETHAZINE HCL 25 MG/ML IJ SOLN
25.0000 mg | Freq: Once | INTRAMUSCULAR | Status: AC
  Administered 2016-01-18: 25 mg via INTRAVENOUS
  Filled 2016-01-18: qty 1

## 2016-01-18 MED ORDER — ATENOLOL 25 MG PO TABS
100.0000 mg | ORAL_TABLET | Freq: Once | ORAL | Status: AC
  Administered 2016-01-18: 100 mg via ORAL
  Filled 2016-01-18: qty 4
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
## 2016-01-18 NOTE — ED Notes (Signed)
Patient is resting comfortably. 

## 2016-01-18 NOTE — ED Notes (Addendum)
Pt c/o increase in BP. States it normally runs less than 120, but it has been in the 170s. Pt also c/o migraine with light sensitivity and vomiting.

## 2016-01-18 NOTE — Discharge Instructions (Signed)
Return for worsening symptoms, including escalating pain, difficulty breathing, confusion, inability to walk, new numbness/weakness or vision/speech changes, or any other symptoms concerning to you.  Start taking your new dose of atenolol tomorrow as prescribed.    Hypertension Hypertension, commonly called high blood pressure, is when the force of blood pumping through your arteries is too strong. Your arteries are the blood vessels that carry blood from your heart throughout your body. A blood pressure reading consists of a higher number over a lower number, such as 110/72. The higher number (systolic) is the pressure inside your arteries when your heart pumps. The lower number (diastolic) is the pressure inside your arteries when your heart relaxes. Ideally you want your blood pressure below 120/80. Hypertension forces your heart to work harder to pump blood. Your arteries may become narrow or stiff. Having untreated or uncontrolled hypertension can cause heart attack, stroke, kidney disease, and other problems. RISK FACTORS Some risk factors for high blood pressure are controllable. Others are not.  Risk factors you cannot control include:   Race. You may be at higher risk if you are African American.  Age. Risk increases with age.  Gender. Men are at higher risk than women before age 5 years. After age 52, women are at higher risk than men. Risk factors you can control include:  Not getting enough exercise or physical activity.  Being overweight.  Getting too much fat, sugar, calories, or salt in your diet.  Drinking too much alcohol. SIGNS AND SYMPTOMS Hypertension does not usually cause signs or symptoms. Extremely high blood pressure (hypertensive crisis) may cause headache, anxiety, shortness of breath, and nosebleed. DIAGNOSIS To check if you have hypertension, your health care provider will measure your blood pressure while you are seated, with your arm held at the level of  your heart. It should be measured at least twice using the same arm. Certain conditions can cause a difference in blood pressure between your right and left arms. A blood pressure reading that is higher than normal on one occasion does not mean that you need treatment. If it is not clear whether you have high blood pressure, you may be asked to return on a different day to have your blood pressure checked again. Or, you may be asked to monitor your blood pressure at home for 1 or more weeks. TREATMENT Treating high blood pressure includes making lifestyle changes and possibly taking medicine. Living a healthy lifestyle can help lower high blood pressure. You may need to change some of your habits. Lifestyle changes may include:  Following the DASH diet. This diet is high in fruits, vegetables, and whole grains. It is low in salt, red meat, and added sugars.  Keep your sodium intake below 2,300 mg per day.  Getting at least 30-45 minutes of aerobic exercise at least 4 times per week.  Losing weight if necessary.  Not smoking.  Limiting alcoholic beverages.  Learning ways to reduce stress. Your health care provider may prescribe medicine if lifestyle changes are not enough to get your blood pressure under control, and if one of the following is true:  You are 72-28 years of age and your systolic blood pressure is above 140.  You are 1 years of age or older, and your systolic blood pressure is above 150.  Your diastolic blood pressure is above 90.  You have diabetes, and your systolic blood pressure is over [id number] or your diastolic blood pressure is over 90.  You have kidney disease  and your blood pressure is above 140/90.  You have heart disease and your blood pressure is above 140/90. Your personal target blood pressure may vary depending on your medical conditions, your age, and other factors. HOME CARE INSTRUCTIONS  Have your blood pressure rechecked as directed by your health care  provider.   Take medicines only as directed by your health care provider. Follow the directions carefully. Blood pressure medicines must be taken as prescribed. The medicine does not work as well when you skip doses. Skipping doses also puts you at risk for problems.  Do not smoke.   Monitor your blood pressure at home as directed by your health care provider. SEEK MEDICAL CARE IF:   You think you are having a reaction to medicines taken.  You have recurrent headaches or feel dizzy.  You have swelling in your ankles.  You have trouble with your vision. SEEK IMMEDIATE MEDICAL CARE IF:  You develop a severe headache or confusion.  You have unusual weakness, numbness, or feel faint.  You have severe chest or abdominal pain.  You vomit repeatedly.  You have trouble breathing. MAKE SURE YOU:   Understand these instructions.  Will watch your condition.  Will get help right away if you are not doing well or get worse.   This information is not intended to replace advice given to you by your health care provider. Make sure you discuss any questions you have with your health care provider.   Document Released: 07/15/2005 Document Revised: 11/29/2014 Document Reviewed: 05/07/2013 Elsevier Interactive Patient Education 2016 [employer].  Migraine Headache A migraine headache is very bad, throbbing pain on one or both sides of your head. Talk to your doctor about what things may bring on (trigger) your migraine headaches. HOME CARE  Only take medicines as told by your doctor.  Lie down in a dark, quiet room when you have a migraine.  Keep a journal to find out if certain things bring on migraine headaches. For example, write down:  What you eat and drink.  How much sleep you get.  Any change to your diet or medicines.  Lessen how much alcohol you drink.  Quit smoking if you smoke.  Get enough sleep.  Lessen any stress in your life.  Keep lights dim if bright  lights bother you or make your migraines worse. GET HELP RIGHT AWAY IF:   Your migraine becomes really bad.  You have a fever.  You have a stiff neck.  You have trouble seeing.  Your muscles are weak, or you lose muscle control.  You lose your balance or have trouble walking.  You feel like you will pass out (faint), or you pass out.  You have really bad symptoms that are different than your first symptoms. MAKE SURE YOU:   Understand these instructions.  Will watch your condition.  Will get help right away if you are not doing well or get worse.   This information is not intended to replace advice given to you by your health care provider. Make sure you discuss any questions you have with your health care provider.   Document Released: 04/23/2008 Document Revised: 10/07/2011 Document Reviewed: 03/22/2013 Elsevier Interactive Patient Education [employer].

## 2016-03-28 ENCOUNTER — Encounter (HOSPITAL_COMMUNITY): Payer: Self-pay | Admitting: Emergency Medicine

## 2016-03-28 ENCOUNTER — Emergency Department (HOSPITAL_COMMUNITY): Payer: Self-pay

## 2016-03-28 ENCOUNTER — Inpatient Hospital Stay (HOSPITAL_COMMUNITY)
Admission: EM | Admit: 2016-03-28 | Discharge: 2016-04-01 | DRG: 389 | Disposition: A | Discharge status: Home / Self Care | Payer: Self-pay | Attending: Internal Medicine | Admitting: Internal Medicine

## 2016-03-28 ENCOUNTER — Inpatient Hospital Stay (HOSPITAL_COMMUNITY): Payer: Self-pay

## 2016-03-28 DIAGNOSIS — G43909 Migraine, unspecified, not intractable, without status migrainosus: Secondary | ICD-10-CM | POA: Diagnosis present

## 2016-03-28 DIAGNOSIS — E876 Hypokalemia: Secondary | ICD-10-CM | POA: Diagnosis not present

## 2016-03-28 DIAGNOSIS — K5669 Other intestinal obstruction: Secondary | ICD-10-CM

## 2016-03-28 DIAGNOSIS — Z91010 Allergy to peanuts: Secondary | ICD-10-CM

## 2016-03-28 DIAGNOSIS — R109 Unspecified abdominal pain: Secondary | ICD-10-CM

## 2016-03-28 DIAGNOSIS — F319 Bipolar disorder, unspecified: Secondary | ICD-10-CM | POA: Diagnosis present

## 2016-03-28 DIAGNOSIS — E669 Obesity, unspecified: Secondary | ICD-10-CM

## 2016-03-28 DIAGNOSIS — K219 Gastro-esophageal reflux disease without esophagitis: Secondary | ICD-10-CM | POA: Diagnosis present

## 2016-03-28 DIAGNOSIS — I10 Essential (primary) hypertension: Secondary | ICD-10-CM | POA: Diagnosis present

## 2016-03-28 DIAGNOSIS — J45909 Unspecified asthma, uncomplicated: Secondary | ICD-10-CM | POA: Diagnosis present

## 2016-03-28 DIAGNOSIS — Z6841 Body Mass Index (BMI) 40.0 and over, adult: Secondary | ICD-10-CM

## 2016-03-28 DIAGNOSIS — K56609 Unspecified intestinal obstruction, unspecified as to partial versus complete obstruction: Secondary | ICD-10-CM | POA: Diagnosis present

## 2016-03-28 DIAGNOSIS — Z87891 Personal history of nicotine dependence: Secondary | ICD-10-CM

## 2016-03-28 DIAGNOSIS — K5660 Unspecified intestinal obstruction: Principal | ICD-10-CM | POA: Diagnosis present

## 2016-03-28 DIAGNOSIS — Z833 Family history of diabetes mellitus: Secondary | ICD-10-CM

## 2016-03-28 DIAGNOSIS — E119 Type 2 diabetes mellitus without complications: Secondary | ICD-10-CM | POA: Diagnosis present

## 2016-03-28 DIAGNOSIS — E1169 Type 2 diabetes mellitus with other specified complication: Secondary | ICD-10-CM

## 2016-03-28 DIAGNOSIS — Z0189 Encounter for other specified special examinations: Secondary | ICD-10-CM

## 2016-03-28 DIAGNOSIS — G4733 Obstructive sleep apnea (adult) (pediatric): Secondary | ICD-10-CM | POA: Diagnosis present

## 2016-03-28 DIAGNOSIS — J4520 Mild intermittent asthma, uncomplicated: Secondary | ICD-10-CM

## 2016-03-28 DIAGNOSIS — Z79899 Other long term (current) drug therapy: Secondary | ICD-10-CM

## 2016-03-28 DIAGNOSIS — F411 Generalized anxiety disorder: Secondary | ICD-10-CM | POA: Diagnosis present

## 2016-03-28 HISTORY — DX: Acquired absence of other specified parts of digestive tract: Z90.49

## 2016-03-28 HISTORY — DX: Unspecified intestinal obstruction, unspecified as to partial versus complete obstruction: K56.609

## 2016-03-28 HISTORY — DX: Excessive and frequent menstruation with regular cycle: N92.0

## 2016-03-28 LAB — COMPREHENSIVE METABOLIC PANEL
ALK PHOS: 57 U/L (ref 38–126)
ALT: 13 U/L — AB (ref 14–54)
AST: 15 U/L (ref 15–41)
Albumin: 4.2 g/dL (ref 3.5–5.0)
Anion gap: 5 (ref 5–15)
BUN: 8 mg/dL (ref 6–20)
CALCIUM: 9.4 mg/dL (ref 8.9–10.3)
CO2: 27 mmol/L (ref 22–32)
Chloride: 109 mmol/L (ref 101–111)
Creatinine, Ser: 0.99 mg/dL (ref 0.44–1.00)
GFR calc non Af Amer: >60 mL/min (ref >60)
GLUCOSE: 112 mg/dL — AB (ref 65–99)
Potassium: 3.5 mmol/L (ref 3.5–5.1)
SODIUM: 141 mmol/L (ref 135–145)
Total Bilirubin: 0.4 mg/dL (ref 0.3–1.2)
Total Protein: 8 g/dL (ref 6.5–8.1)

## 2016-03-28 LAB — CBC WITH DIFFERENTIAL/PLATELET
Basophils Absolute: 0 10*3/uL (ref 0.0–0.1)
Basophils Relative: 0 %
EOS ABS: 0.1 10*3/uL (ref 0.0–0.7)
Eosinophils Relative: 1 %
HCT: 40.8 % (ref 36.0–46.0)
HEMOGLOBIN: 13.1 g/dL (ref 12.0–15.0)
LYMPHS ABS: 2.4 10*3/uL (ref 0.7–4.0)
Lymphocytes Relative: 34 %
MCH: 26.6 pg (ref 26.0–34.0)
MCHC: 32.1 g/dL (ref 30.0–36.0)
MCV: 82.8 fL (ref 78.0–100.0)
Monocytes Absolute: 0.4 10*3/uL (ref 0.1–1.0)
Monocytes Relative: 5 %
NEUTROS PCT: 60 %
Neutro Abs: 4.2 10*3/uL (ref 1.7–7.7)
Platelets: 312 10*3/uL (ref 150–400)
RBC: 4.93 MIL/uL (ref 3.87–5.11)
RDW: 14.3 % (ref 11.5–15.5)
WBC: 7.1 10*3/uL (ref 4.0–10.5)

## 2016-03-28 LAB — URINALYSIS, ROUTINE W REFLEX MICROSCOPIC
BILIRUBIN URINE: NEGATIVE
Glucose, UA: NEGATIVE mg/dL
HGB URINE DIPSTICK: NEGATIVE
Ketones, ur: NEGATIVE mg/dL
Leukocytes, UA: NEGATIVE
Nitrite: NEGATIVE
PH: 5.5 (ref 5.0–8.0)
Protein, ur: NEGATIVE mg/dL
SPECIFIC GRAVITY, URINE: 1.034 — AB (ref 1.005–1.030)

## 2016-03-28 LAB — LIPASE, BLOOD: Lipase: 23 U/L (ref 11–51)

## 2016-03-28 MED ORDER — ACETAMINOPHEN 650 MG RE SUPP: 650.0000 mg | Freq: Four times a day (QID) | RECTAL | Status: DC | PRN

## 2016-03-28 MED ORDER — HYDRALAZINE HCL 20 MG/ML IJ SOLN
10.0000 mg | INTRAMUSCULAR | Status: DC | PRN
  Administered 2016-03-28 – 2016-03-29 (×2): 10 mg via INTRAVENOUS
  Filled 2016-03-28 (×2): qty 1

## 2016-03-28 MED ORDER — TRAZODONE HCL 100 MG PO TABS
200.0000 mg | ORAL_TABLET | Freq: Every day | ORAL | Status: DC
  Administered 2016-03-28: 200 mg via ORAL
  Filled 2016-03-28: qty 2

## 2016-03-28 MED ORDER — DEXTROSE-NACL 5-0.9 % IV SOLN
INTRAVENOUS | Status: DC
Start: 2016-03-28 — End: 2016-03-31
  Administered 2016-03-28 – 2016-03-30 (×4): via INTRAVENOUS

## 2016-03-28 MED ORDER — MAGIC MOUTHWASH
15.0000 mL | Freq: Four times a day (QID) | ORAL | Status: DC | PRN
  Filled 2016-03-28 (×2): qty 15

## 2016-03-28 MED ORDER — HYDROMORPHONE HCL 1 MG/ML IJ SOLN
1.0000 mg | Freq: Once | INTRAMUSCULAR | Status: DC
  Filled 2016-03-28: qty 1

## 2016-03-28 MED ORDER — ACETAMINOPHEN 325 MG PO TABS: 650.0000 mg | ORAL_TABLET | Freq: Four times a day (QID) | ORAL | Status: DC | PRN

## 2016-03-28 MED ORDER — DIPHENHYDRAMINE HCL 50 MG/ML IJ SOLN: 12.5000 mg | Freq: Four times a day (QID) | INTRAMUSCULAR | Status: DC | PRN

## 2016-03-28 MED ORDER — ALBUTEROL SULFATE HFA 108 (90 BASE) MCG/ACT IN AERS: 2.0000 | INHALATION_SPRAY | Freq: Four times a day (QID) | RESPIRATORY_TRACT | Status: DC | PRN

## 2016-03-28 MED ORDER — DIPHENHYDRAMINE HCL 50 MG/ML IJ SOLN
12.5000 mg | Freq: Once | INTRAMUSCULAR | Status: AC
  Administered 2016-03-28: 12.5 mg via INTRAVENOUS
  Filled 2016-03-28: qty 1

## 2016-03-28 MED ORDER — ALUM & MAG HYDROXIDE-SIMETH 200-200-20 MG/5ML PO SUSP: 30.0000 mL | Freq: Four times a day (QID) | ORAL | Status: DC | PRN

## 2016-03-28 MED ORDER — MENTHOL 3 MG MT LOZG
1.0000 | LOZENGE | OROMUCOSAL | Status: DC | PRN
  Filled 2016-03-28: qty 9

## 2016-03-28 MED ORDER — PROCHLORPERAZINE EDISYLATE 5 MG/ML IJ SOLN
5.0000 mg | INTRAMUSCULAR | Status: DC | PRN
  Administered 2016-03-28: 10 mg via INTRAVENOUS
  Filled 2016-03-28 (×2): qty 2

## 2016-03-28 MED ORDER — ALBUTEROL SULFATE (2.5 MG/3ML) 0.083% IN NEBU: 2.5000 mg | INHALATION_SOLUTION | Freq: Four times a day (QID) | RESPIRATORY_TRACT | Status: DC | PRN

## 2016-03-28 MED ORDER — HYDROMORPHONE HCL 1 MG/ML IJ SOLN
1.0000 mg | Freq: Once | INTRAMUSCULAR | Status: AC
  Administered 2016-03-28: 1 mg via INTRAVENOUS
  Filled 2016-03-28: qty 1

## 2016-03-28 MED ORDER — SODIUM CHLORIDE 0.9 % IV BOLUS (SEPSIS)
1000.0000 mL | Freq: Once | INTRAVENOUS | Status: AC
  Administered 2016-03-28: 1000 mL via INTRAVENOUS

## 2016-03-28 MED ORDER — HYDRALAZINE HCL 20 MG/ML IJ SOLN: 5.0000 mg | Freq: Four times a day (QID) | INTRAMUSCULAR | Status: DC | PRN

## 2016-03-28 MED ORDER — ONDANSETRON HCL 4 MG/2ML IJ SOLN
4.0000 mg | Freq: Four times a day (QID) | INTRAMUSCULAR | Status: DC | PRN
  Administered 2016-03-28 – 2016-03-30 (×5): 4 mg via INTRAVENOUS
  Filled 2016-03-28 (×4): qty 2

## 2016-03-28 MED ORDER — PAROXETINE HCL 20 MG PO TABS
20.0000 mg | ORAL_TABLET | Freq: Every day | ORAL | Status: DC
  Administered 2016-03-28 – 2016-03-29 (×2): 20 mg via ORAL
  Filled 2016-03-28 (×2): qty 1

## 2016-03-28 MED ORDER — LIP MEDEX EX OINT
1.0000 "application " | TOPICAL_OINTMENT | Freq: Two times a day (BID) | CUTANEOUS | Status: DC
  Administered 2016-03-28: 1 via TOPICAL

## 2016-03-28 MED ORDER — QUETIAPINE FUMARATE 300 MG PO TABS
600.0000 mg | ORAL_TABLET | Freq: Every day | ORAL | Status: DC
  Administered 2016-03-28: 600 mg via ORAL
  Filled 2016-03-28: qty 2
  Filled 2016-03-28: qty 6

## 2016-03-28 MED ORDER — BUDESONIDE 0.25 MG/2ML IN SUSP
0.2500 mg | Freq: Two times a day (BID) | RESPIRATORY_TRACT | Status: DC
Start: 2016-03-28 — End: 2016-04-01
  Administered 2016-03-30 – 2016-03-31 (×4): 0.25 mg via RESPIRATORY_TRACT
  Filled 2016-03-28 (×8): qty 2

## 2016-03-28 MED ORDER — LACTATED RINGERS IV BOLUS (SEPSIS)
1000.0000 mL | Freq: Once | INTRAVENOUS | Status: AC
  Administered 2016-03-28: 1000 mL via INTRAVENOUS

## 2016-03-28 MED ORDER — CLONIDINE HCL 0.1 MG PO TABS
0.3000 mg | ORAL_TABLET | Freq: Two times a day (BID) | ORAL | Status: DC
  Administered 2016-03-28 – 2016-03-29 (×3): 0.3 mg via ORAL
  Filled 2016-03-28 (×3): qty 3

## 2016-03-28 MED ORDER — ENOXAPARIN SODIUM 40 MG/0.4ML ~~LOC~~ SOLN
40.0000 mg | SUBCUTANEOUS | Status: DC
  Administered 2016-03-28 – 2016-03-31 (×4): 40 mg via SUBCUTANEOUS
  Filled 2016-03-28 (×4): qty 0.4

## 2016-03-28 MED ORDER — IOPAMIDOL (ISOVUE-300) INJECTION 61%
100.0000 mL | Freq: Once | INTRAVENOUS | Status: AC | PRN
  Administered 2016-03-28: 100 mL via INTRAVENOUS

## 2016-03-28 MED ORDER — PHENOL 1.4 % MT LIQD
2.0000 | OROMUCOSAL | Status: DC | PRN
  Filled 2016-03-28: qty 177

## 2016-03-28 MED ORDER — BECLOMETHASONE DIPROPIONATE 40 MCG/ACT IN AERS: 2.0000 | INHALATION_SPRAY | Freq: Two times a day (BID) | RESPIRATORY_TRACT | Status: DC

## 2016-03-28 MED ORDER — DIATRIZOATE MEGLUMINE & SODIUM 66-10 % PO SOLN
90.0000 mL | Freq: Once | ORAL | Status: AC
  Administered 2016-03-28: 90 mL via NASOGASTRIC
  Filled 2016-03-28: qty 90

## 2016-03-28 MED ORDER — ONDANSETRON HCL 40 MG/20ML IJ SOLN
8.0000 mg | Freq: Four times a day (QID) | INTRAMUSCULAR | Status: DC | PRN
  Filled 2016-03-28: qty 4

## 2016-03-28 MED ORDER — ONDANSETRON HCL 4 MG/2ML IJ SOLN
4.0000 mg | Freq: Once | INTRAMUSCULAR | Status: AC
  Administered 2016-03-28: 4 mg via INTRAVENOUS
  Filled 2016-03-28: qty 2

## 2016-03-28 MED ORDER — MORPHINE SULFATE (PF) 2 MG/ML IV SOLN
1.0000 mg | INTRAVENOUS | Status: DC | PRN
  Administered 2016-03-28 (×2): 1 mg via INTRAVENOUS
  Filled 2016-03-28 (×2): qty 1

## 2016-03-28 MED ORDER — LIDOCAINE VISCOUS 2 % MT SOLN
15.0000 mL | Freq: Once | OROMUCOSAL | Status: AC
  Administered 2016-03-28: 15 mL via OROMUCOSAL
  Filled 2016-03-28: qty 15

## 2016-03-28 MED ORDER — ONDANSETRON HCL 4 MG/2ML IJ SOLN
INTRAMUSCULAR | Status: AC
  Administered 2016-03-28: 4 mg via INTRAVENOUS
  Filled 2016-03-28: qty 2

## 2016-03-28 MED ORDER — ATENOLOL 25 MG PO TABS
100.0000 mg | ORAL_TABLET | Freq: Two times a day (BID) | ORAL | Status: DC
  Administered 2016-03-28 – 2016-03-29 (×3): 100 mg via ORAL
  Filled 2016-03-28 (×3): qty 4

## 2016-03-28 MED ORDER — METOPROLOL TARTRATE 5 MG/5ML IV SOLN: 5.0000 mg | Freq: Four times a day (QID) | INTRAVENOUS | Status: DC | PRN

## 2016-03-28 MED ORDER — DIPHENHYDRAMINE HCL 25 MG PO CAPS
25.0000 mg | ORAL_CAPSULE | Freq: Four times a day (QID) | ORAL | Status: DC | PRN
  Administered 2016-03-29: 25 mg via ORAL
  Filled 2016-03-28: qty 1

## 2016-03-28 MED ORDER — LACTATED RINGERS IV BOLUS (SEPSIS): 1000.0000 mL | Freq: Three times a day (TID) | INTRAVENOUS | Status: DC | PRN

## 2016-03-28 MED ORDER — MORPHINE SULFATE (PF) 2 MG/ML IV SOLN
2.0000 mg | INTRAVENOUS | Status: DC | PRN
  Administered 2016-03-28 – 2016-03-30 (×6): 2 mg via INTRAVENOUS
  Filled 2016-03-28 (×6): qty 1

## 2016-03-28 MED ORDER — BISACODYL 10 MG RE SUPP: 10.0000 mg | Freq: Two times a day (BID) | RECTAL | Status: DC | PRN

## 2016-03-28 MED ORDER — LIP MEDEX EX OINT
TOPICAL_OINTMENT | CUTANEOUS | Status: AC
  Administered 2016-03-28: 1 via TOPICAL
  Filled 2016-03-28: qty 7
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
## 2016-03-28 NOTE — ED Notes (Signed)
PA at bedside.

## 2016-03-28 NOTE — ED Notes (Signed)
Bed: WA17 Expected date:  Expected time:  Means of arrival:  Comments: nausea

## 2016-03-28 NOTE — ED Triage Notes (Signed)
Pt BIB EMS from home c/o worsening abdominal pain for past 2 days; nausea, denies vomiting; RLQ tender to palpation. Reports normal BMs and urinary symptoms.

## 2016-03-28 NOTE — ED Notes (Signed)
US at bedside
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
## 2016-03-28 NOTE — ED Notes (Signed)
Pt transported to CT ?

## 2016-03-28 NOTE — ED Notes (Cosign Needed)
Received patient sign out at the beginning of shift. Patient came in with upper abdominal discomfort, nausea or vomiting decrease in bowel movement. Her abdominal pelvis CT scan demonstrated evidence of a small bowel obstruction. Patient admits she was told she has bowel adhesion in 2015 requiring surgical removal (by Dr. Hulan Fray) when she had a hysterectomy. Patient's pain is currently controlled. She reports she has a bowel movement but did not have one yesterday. Plan to place an NG tube, and consult for admission. Patient made nothing by mouth. At this time patient report having some itchiness after receiving pain medication and IV contrast dye. She has had both in the past without any problem. Benadryl given. Can discuss with Dr. Randal Buba.  7:19 AM Appreciate consultation from General Surgery DR. Gross who agrees to be available for consultation but request medicine for admission.  Will consult medicine.    7:32 AM I have consulted [facility], Dr. Cathlean Sauer who agrees to see pt in the ER and will admit for further care.    BP (!) 145/101   Pulse 74   Temp 98.1 F (36.7 C) (Oral)   Resp 20   LMP 07/07/2014 (Approximate)   SpO2 100%    Results for orders placed or performed during the hospital encounter of 03/28/16  CBC with Differential  Result Value Ref Range   WBC 7.1 4.0 - 10.5 K/uL   RBC 4.93 3.87 - 5.11 MIL/uL   Hemoglobin 13.1 12.0 - 15.0 g/dL   HCT 40.8 36.0 - 46.0 %   MCV 82.8 78.0 - 100.0 fL   MCH 26.6 26.0 - 34.0 pg   MCHC 32.1 30.0 - 36.0 g/dL   RDW 14.3 11.5 - 15.5 %   Platelets 312 150 - 400 K/uL   Neutrophils Relative % 60 %   Neutro Abs 4.2 1.7 - 7.7 K/uL   Lymphocytes Relative 34 %   Lymphs Abs 2.4 0.7 - 4.0 K/uL   Monocytes Relative 5 %   Monocytes Absolute 0.4 0.1 - 1.0 K/uL   Eosinophils Relative 1 %   Eosinophils Absolute 0.1 0.0 - 0.7 K/uL   Basophils Relative 0 %   Basophils Absolute 0.0 0.0 - 0.1 K/uL  Comprehensive metabolic panel  Result Value Ref  Range   Sodium 141 135 - 145 mmol/L   Potassium 3.5 3.5 - 5.1 mmol/L   Chloride 109 101 - 111 mmol/L   CO2 27 22 - 32 mmol/L   Glucose, Bld 112 (H) 65 - 99 mg/dL   BUN 8 6 - 20 mg/dL   Creatinine, Ser 0.99 0.44 - 1.00 mg/dL   Calcium 9.4 8.9 - 10.3 mg/dL   Total Protein 8.0 6.5 - 8.1 g/dL   Albumin 4.2 3.5 - 5.0 g/dL   AST 15 15 - 41 U/L   ALT 13 (L) 14 - 54 U/L   Alkaline Phosphatase 57 38 - 126 U/L   Total Bilirubin 0.4 0.3 - 1.2 mg/dL   GFR calc non Af Amer >60 >60 mL/min   GFR calc Af Amer >60 >60 mL/min   Anion gap 5 5 - 15  Lipase, blood  Result Value Ref Range   Lipase 23 11 - 51 U/L  Urinalysis, Routine w reflex microscopic (not at [facility])  Result Value Ref Range   Color, Urine YELLOW YELLOW   APPearance CLEAR CLEAR   Specific Gravity, Urine 1.034 (H) 1.005 - 1.030   pH 5.5 5.0 - 8.0   Glucose, UA NEGATIVE NEGATIVE mg/dL  Hgb urine dipstick NEGATIVE NEGATIVE   Bilirubin Urine NEGATIVE NEGATIVE   Ketones, ur NEGATIVE NEGATIVE mg/dL   Protein, ur NEGATIVE NEGATIVE mg/dL   Nitrite NEGATIVE NEGATIVE   Leukocytes, UA NEGATIVE NEGATIVE   Ct Abdomen Pelvis W Contrast  Result Date: 03/28/2016 CLINICAL DATA:  Right lower quadrant pain for 2 days. EXAM: CT ABDOMEN AND PELVIS WITH CONTRAST TECHNIQUE: Multidetector CT imaging of the abdomen and pelvis was performed using the standard protocol following bolus administration of intravenous contrast. CONTRAST:  158mL ISOVUE-300 IOPAMIDOL (ISOVUE-300) INJECTION 61% COMPARISON:  09/08/2014 FINDINGS: Lower chest:  No significant abnormality Hepatobiliary: There are normal appearances of the liver, gallbladder and bile ducts. Pancreas: Normal Spleen: Small unchanged focal hypodense lesion measuring approximately 2 cm. This is indeterminate but more likely benign. Otherwise normal spleen. Adrenals/Urinary Tract: The adrenals and kidneys are normal in appearance. There is no urinary calculus evident. There is no hydronephrosis or ureteral  dilatation. Collecting systems and ureters appear unremarkable. Stomach/Bowel: There is abnormal dilatation of mid to distal small bowel with abrupt transition to decompressed distal ileum. The transition point is at a small bowel anastomosis in the anterior pelvic midline. Colon is unremarkable. Stomach is unremarkable. No extraluminal air. No ascites. Vascular/Lymphatic: The abdominal aorta is normal in caliber. There is no atherosclerotic calcification. There is no adenopathy in the abdomen or pelvis. Reproductive: Hysterectomy.  No adnexal abnormalities. Other: Musculoskeletal: No significant skeletal lesion. IMPRESSION: Abnormal dilatation of small bowel with abrupt caliber transition at the distal small bowel anastomosis, consistent with a moderate grade small bowel obstruction. No extraluminal air. Electronically Signed   By: Andreas Newport M.D.   On: 03/28/2016 06:40      Domenic Moras, PA-C 03/28/16 0732

## 2016-03-29 ENCOUNTER — Inpatient Hospital Stay (HOSPITAL_COMMUNITY): Payer: Self-pay

## 2016-03-29 DIAGNOSIS — E669 Obesity, unspecified: Secondary | ICD-10-CM

## 2016-03-29 DIAGNOSIS — F319 Bipolar disorder, unspecified: Secondary | ICD-10-CM

## 2016-03-29 DIAGNOSIS — E119 Type 2 diabetes mellitus without complications: Secondary | ICD-10-CM

## 2016-03-29 DIAGNOSIS — J45909 Unspecified asthma, uncomplicated: Secondary | ICD-10-CM

## 2016-03-29 LAB — COMPREHENSIVE METABOLIC PANEL
ALBUMIN: 3.9 g/dL (ref 3.5–5.0)
ALK PHOS: 48 U/L (ref 38–126)
ALT: 14 U/L (ref 14–54)
AST: 14 U/L — AB (ref 15–41)
Anion gap: 7 (ref 5–15)
BILIRUBIN TOTAL: 0.6 mg/dL (ref 0.3–1.2)
BUN: 7 mg/dL (ref 6–20)
CALCIUM: 9.1 mg/dL (ref 8.9–10.3)
CO2: 24 mmol/L (ref 22–32)
CREATININE: 0.86 mg/dL (ref 0.44–1.00)
Chloride: 106 mmol/L (ref 101–111)
GFR calc Af Amer: >60 mL/min (ref >60)
GLUCOSE: 134 mg/dL — AB (ref 65–99)
POTASSIUM: 3.4 mmol/L — AB (ref 3.5–5.1)
Sodium: 137 mmol/L (ref 135–145)
TOTAL PROTEIN: 7.3 g/dL (ref 6.5–8.1)

## 2016-03-29 LAB — CBC
HEMATOCRIT: 39.5 % (ref 36.0–46.0)
Hemoglobin: 12.6 g/dL (ref 12.0–15.0)
MCH: 26 pg (ref 26.0–34.0)
MCHC: 31.9 g/dL (ref 30.0–36.0)
MCV: 81.6 fL (ref 78.0–100.0)
PLATELETS: 349 10*3/uL (ref 150–400)
RBC: 4.84 MIL/uL (ref 3.87–5.11)
RDW: 14.4 % (ref 11.5–15.5)
WBC: 8.1 10*3/uL (ref 4.0–10.5)

## 2016-03-29 LAB — GLUCOSE, CAPILLARY
GLUCOSE-CAPILLARY: 110 mg/dL — AB (ref 65–99)
Glucose-Capillary: 132 mg/dL — ABNORMAL HIGH (ref 65–99)
Glucose-Capillary: 136 mg/dL — ABNORMAL HIGH (ref 65–99)

## 2016-03-29 MED ORDER — ACETAMINOPHEN 650 MG RE SUPP: 650.0000 mg | Freq: Four times a day (QID) | RECTAL | Status: DC | PRN

## 2016-03-29 MED ORDER — DIPHENHYDRAMINE HCL 50 MG/ML IJ SOLN
25.0000 mg | Freq: Once | INTRAMUSCULAR | Status: AC
  Administered 2016-03-29: 25 mg via INTRAVENOUS
  Filled 2016-03-29: qty 1

## 2016-03-29 MED ORDER — ENALAPRILAT 1.25 MG/ML IV SOLN
0.6250 mg | Freq: Four times a day (QID) | INTRAVENOUS | Status: DC | PRN
  Filled 2016-03-29: qty 1

## 2016-03-29 MED ORDER — METOPROLOL TARTRATE 5 MG/5ML IV SOLN
5.0000 mg | Freq: Four times a day (QID) | INTRAVENOUS | Status: DC | PRN
  Administered 2016-03-30: 5 mg via INTRAVENOUS
  Filled 2016-03-29: qty 5

## 2016-03-29 MED ORDER — FAMOTIDINE IN NACL 20-0.9 MG/50ML-% IV SOLN
20.0000 mg | Freq: Two times a day (BID) | INTRAVENOUS | Status: DC
  Administered 2016-03-29 – 2016-04-01 (×7): 20 mg via INTRAVENOUS
  Filled 2016-03-29 (×9): qty 50

## 2016-03-29 MED ORDER — HYDRALAZINE HCL 20 MG/ML IJ SOLN
10.0000 mg | INTRAMUSCULAR | Status: DC | PRN
  Administered 2016-03-30: 10 mg via INTRAVENOUS
  Filled 2016-03-29: qty 1

## 2016-03-29 MED ORDER — METOCLOPRAMIDE HCL 5 MG/ML IJ SOLN
10.0000 mg | Freq: Once | INTRAMUSCULAR | Status: AC
  Administered 2016-03-29: 10 mg via INTRAVENOUS
  Filled 2016-03-29: qty 2

## 2016-03-29 MED ORDER — LACTATED RINGERS IV BOLUS (SEPSIS): 1000.0000 mL | Freq: Three times a day (TID) | INTRAVENOUS | Status: DC | PRN

## 2016-03-29 MED ORDER — KETOROLAC TROMETHAMINE 15 MG/ML IJ SOLN
15.0000 mg | Freq: Three times a day (TID) | INTRAMUSCULAR | Status: DC | PRN
  Administered 2016-03-29 – 2016-03-31 (×4): 15 mg via INTRAVENOUS
  Filled 2016-03-29 (×4): qty 1

## 2016-03-29 MED ORDER — LIP MEDEX EX OINT
1.0000 "application " | TOPICAL_OINTMENT | Freq: Two times a day (BID) | CUTANEOUS | Status: DC
  Administered 2016-03-30 – 2016-04-01 (×5): 1 via TOPICAL
  Filled 2016-03-29: qty 7

## 2016-03-29 MED ORDER — LACTATED RINGERS IV BOLUS (SEPSIS)
1000.0000 mL | Freq: Once | INTRAVENOUS | Status: AC
  Administered 2016-03-29: 1000 mL via INTRAVENOUS

## 2016-03-29 MED ORDER — HYDROMORPHONE HCL 1 MG/ML IJ SOLN
0.5000 mg | INTRAMUSCULAR | Status: DC | PRN
  Administered 2016-03-29 – 2016-04-01 (×7): 1 mg via INTRAVENOUS
  Filled 2016-03-29 (×9): qty 1

## 2016-03-29 MED ORDER — INSULIN ASPART 100 UNIT/ML ~~LOC~~ SOLN
0.0000 [IU] | SUBCUTANEOUS | Status: DC
  Administered 2016-03-29 – 2016-03-30 (×4): 1 [IU] via SUBCUTANEOUS
  Administered 2016-03-30: 2 [IU] via SUBCUTANEOUS
  Administered 2016-03-30: 1 [IU] via SUBCUTANEOUS
  Administered 2016-03-30: 2 [IU] via SUBCUTANEOUS
  Administered 2016-03-31 – 2016-04-01 (×4): 1 [IU] via SUBCUTANEOUS

## 2016-03-29 MED ORDER — DEXAMETHASONE SODIUM PHOSPHATE 10 MG/ML IJ SOLN
10.0000 mg | Freq: Once | INTRAMUSCULAR | Status: AC
  Administered 2016-03-29: 10 mg via INTRAVENOUS
  Filled 2016-03-29: qty 1

## 2016-03-29 MED ORDER — DIPHENHYDRAMINE HCL 50 MG/ML IJ SOLN
12.5000 mg | Freq: Four times a day (QID) | INTRAMUSCULAR | Status: DC | PRN
  Administered 2016-03-29 – 2016-04-01 (×6): 12.5 mg via INTRAVENOUS
  Administered 2016-04-01: 25 mg via INTRAVENOUS
  Filled 2016-03-29 (×8): qty 1

## 2016-03-29 MED ORDER — BUTALBITAL-APAP-CAFFEINE 50-325-40 MG PO TABS: 2.0000 | ORAL_TABLET | Freq: Three times a day (TID) | ORAL | Status: DC | PRN

## 2016-03-29 MED ORDER — HYDRALAZINE HCL 20 MG/ML IJ SOLN: 10.0000 mg | INTRAMUSCULAR | Status: DC | PRN

## 2016-03-29 MED ORDER — BISACODYL 10 MG RE SUPP: 10.0000 mg | Freq: Two times a day (BID) | RECTAL | Status: DC | PRN

## 2016-03-29 NOTE — Significant Event (Addendum)
Patient was assisted OOB to ambulate in hallway. Walked approximately 50 feet before becoming dizzy and began to fall. Patient was eased down to a chair and never fell on the floor. She was then wheeled back to room where her BP read 144/104 and HR was 63. Patient oriented X4. Resting in bed now and dizziness has subsided.

## 2016-03-29 NOTE — Progress Notes (Signed)
PROGRESS NOTE    Patricia [name]  [id number] DOB: 08-Mar-1971 DOA: 03/28/2016 PCP: Carmie Kanner, NP    Brief Narrative:  Small bowel obstruction. 45 yo female with prior abdominal surgeries and htn on clonidine. On supportive medical therapy, NG tube in place. Surgery consulted.    Assessment & Plan:   Principal Problem:   SBO (small bowel obstruction) (HCC) Active Problems:   Diabetes mellitus type 2 in obese ([place])   BIPOLAR DISORDER UNSPECIFIED   Anxiety state   Obstructive sleep apnea   Asthma   Morbid obesity ([place])   Small bowel obstruction ([place])   1. Small bowel obstruction. Will continue supportive care, abdominal films personally reviewed noted persistent air fluid levels, reported persistent small bowel obstruction, will continue NG tube to low intermittent suction. Continue pain control ( will add ketorolac IV), anti-emetics and antiacid therapy. Follow on surgery recommendations.   2. Hypertension. Blood pressure systolic [id number] to 0000000, will continue clonidine, atenolol and as needed hydralazine. Continue IV fluids at 100 ml.hr.  3. Asthma. Stable, will continue oxymetry monitor and as needed 02 per [place], avoid volume overload. Continue budesonide and as needed duonebs.  4, DM. Patient not on antihyperglycemic agents at home, but past past medical history of DM type 2, will start patient on insulin per sliding scale and glucose monitor. Serum glucose 112 to 134.   5. Bipolar. Continue seroquel, paroxetine and trazodone.    DVT prophylaxis: lovenox Code Status: full Family Communication: patient's family at the bedside all questions were addressed. Disposition Plan:  Home  Consultants:   Surgery  Procedures:      Antimicrobials:       Subjective: Patient with significant pain on the right face, dull and pressure in nature, no worsening or improving factors, no relieve from morphine. No radiation. No nausea or vomiting, positive flatus, but no bowel  movement.   Objective: Vitals:   03/29/16 0614 03/29/16 0800 03/29/16 0947 03/29/16 1140  BP: (!) 166/105 (!) 147/96 (!) 164/91 (!) 150/93  Pulse: 73 79 68 67  Resp: 17 (!) 22    Temp: 98.3 F (36.8 C) 97.8 F (36.6 C)  97.9 F (36.6 C)  TempSrc: Oral Oral  Oral  SpO2: 100% 98% 96% 99%    Intake/Output Summary (Last 24 hours) at 03/29/16 1243 Last data filed at 03/29/16 1100  Gross per 24 hour  Intake              515 ml  Output             1900 ml  Net            -1385 ml   There were no vitals filed for this visit.  Examination:  General exam: deconditioned. E ENT: oral mucosa dry, conjunctivae with mild pallor. Respiratory system: Clear to auscultation. Respiratory effort normal. Mild decreased breath sounds at bases. Cardiovascular system: S1 & S2 heard, RRR. No JVD, murmurs, rubs, gallops or clicks. No pedal edema. Gastrointestinal system: Abdomen is distended, timpanic to percussion, decreased bowel sounds, soft but tender to deep palpation on the right lower quadrant. No organomegaly or masses felt. Normal bowel sounds heard. Central nervous system: Alert and oriented. No focal neurological deficits. Extremities: Symmetric 5 x 5 power. Skin: No rashes, lesions or ulcers Psychiatry: Judgement and insight appear normal. Mood & affect appropriate.     Data Reviewed: I have personally reviewed following labs and imaging studies  CBC:  Recent Labs Lab 03/28/16 0439 03/29/16 0526  WBC 7.1 8.1  NEUTROABS 4.2  --   HGB 13.1 12.6  HCT 40.8 39.5  MCV 82.8 81.6  PLT 312 0000000   Basic Metabolic Panel:  Recent Labs Lab 03/28/16 0439 03/29/16 0526  NA 141 137  K 3.5 3.4*  CL 109 106  CO2 27 24  GLUCOSE 112* 134*  BUN 8 7  CREATININE 0.99 0.86  CALCIUM 9.4 9.1   GFR: CrCl cannot be calculated (Unknown ideal weight.). Liver Function Tests:  Recent Labs Lab 03/28/16 0439 03/29/16 0526  AST 15 14*  ALT 13* 14  ALKPHOS 57 48  BILITOT 0.4 0.6  PROT  8.0 7.3  ALBUMIN 4.2 3.9    Recent Labs Lab 03/28/16 0439  LIPASE 23   No results for input(s): AMMONIA in the last 168 hours. Coagulation Profile: No results for input(s): INR, PROTIME in the last 168 hours. Cardiac Enzymes: No results for input(s): CKTOTAL, CKMB, CKMBINDEX, TROPONINI in the last 168 hours. BNP (last 3 results) No results for input(s): PROBNP in the last 8760 hours. HbA1C: No results for input(s): HGBA1C in the last 72 hours. CBG: No results for input(s): GLUCAP in the last 168 hours. Lipid Profile: No results for input(s): CHOL, HDL, LDLCALC, TRIG, CHOLHDL, LDLDIRECT in the last 72 hours. Thyroid Function Tests: No results for input(s): TSH, T4TOTAL, FREET4, T3FREE, THYROIDAB in the last 72 hours. Anemia Panel: No results for input(s): VITAMINB12, FOLATE, FERRITIN, TIBC, IRON, RETICCTPCT in the last 72 hours. Sepsis Labs: No results for input(s): PROCALCITON, LATICACIDVEN in the last 168 hours.  No results found for this or any previous visit (from the past 240 hour(s)).       Radiology Studies: Ct Abdomen Pelvis W Contrast  Result Date: 03/28/2016 CLINICAL DATA:  Right lower quadrant pain for 2 days. EXAM: CT ABDOMEN AND PELVIS WITH CONTRAST TECHNIQUE: Multidetector CT imaging of the abdomen and pelvis was performed using the standard protocol following bolus administration of intravenous contrast. CONTRAST:  173mL ISOVUE-300 IOPAMIDOL (ISOVUE-300) INJECTION 61% COMPARISON:  09/08/2014 FINDINGS: Lower chest:  No significant abnormality Hepatobiliary: There are normal appearances of the liver, gallbladder and bile ducts. Pancreas: Normal Spleen: Small unchanged focal hypodense lesion measuring approximately 2 cm. This is indeterminate but more likely benign. Otherwise normal spleen. Adrenals/Urinary Tract: The adrenals and kidneys are normal in appearance. There is no urinary calculus evident. There is no hydronephrosis or ureteral dilatation. Collecting  systems and ureters appear unremarkable. Stomach/Bowel: There is abnormal dilatation of mid to distal small bowel with abrupt transition to decompressed distal ileum. The transition point is at a small bowel anastomosis in the anterior pelvic midline. Colon is unremarkable. Stomach is unremarkable. No extraluminal air. No ascites. Vascular/Lymphatic: The abdominal aorta is normal in caliber. There is no atherosclerotic calcification. There is no adenopathy in the abdomen or pelvis. Reproductive: Hysterectomy.  No adnexal abnormalities. Other: Musculoskeletal: No significant skeletal lesion. IMPRESSION: Abnormal dilatation of small bowel with abrupt caliber transition at the distal small bowel anastomosis, consistent with a moderate grade small bowel obstruction. No extraluminal air. Electronically Signed   By: Andreas Newport M.D.   On: 03/28/2016 06:40   Dg Abd 2 Views  Result Date: 03/29/2016 CLINICAL DATA:  Small-bowel obstruction.  Abdominal pain. EXAM: ABDOMEN - 2 VIEW COMPARISON:  03/28/2016. FINDINGS: NG tube noted in the stomach. Residual oral contrast in stomach. No definite contrast noted in bowel loops. Dilated loops of small bowel are again noted consistent with small bowel obstruction. No pneumatosis. No free air.  No acute bony abnormality. IMPRESSION: 1. NG tube noted with tip in stomach. Residual contrast noted in the stomach. No significant volume of contrast noted in the small bowel loops. 2. Persistent dilated loops of small bowel consistent small bowel obstruction. No free air. Electronically Signed   By: Marcello Moores  Register   On: 03/29/2016 09:34   Dg Abd Portable 1v-small Bowel Obstruction Protocol-initial, 8 Hr Delay  Result Date: 03/28/2016 CLINICAL DATA:  Small bowel protocol.  8 hour delayed image. EXAM: PORTABLE ABDOMEN - 1 VIEW COMPARISON:  03/28/2016 at 7:58 a.m. FINDINGS: Contrast injected through the nasogastric tube poles within the gastric fundus. No contrast is seen within the  small bowel. IMPRESSION: Findings support a high-grade small bowel obstruction. Contrast remains in the stomach. Electronically Signed   By: Lajean Manes M.D.   On: 03/28/2016 17:01   Dg Abd Portable 1v-small Bowel Protocol-position Verification  Result Date: 03/28/2016 CLINICAL DATA:  NG tube placement for small bowel protocol EXAM: PORTABLE ABDOMEN - 1 VIEW COMPARISON:  CT scan of the abdomen and pelvis of today's date FINDINGS: The esophagogastric tube tip projects in the region of the gastric body but the proximal port is at or just below the GE junction. There remain a few mildly distended small bowel loops. The stool burden in the colon is moderate. There is contrast within the renal collecting systems bilaterally. IMPRESSION: Advancement of the nasogastric tube by approximately 10 cm is recommended to assure that the proximal port remains below the GE junction. Electronically Signed   By: David  Martinique M.D.   On: 03/28/2016 08:14        Scheduled Meds: . atenolol  100 mg Oral BID  . budesonide  0.25 mg Nebulization BID  . cloNIDine  0.3 mg Oral BID  . enoxaparin (LOVENOX) injection  40 mg Subcutaneous Q24H  . PARoxetine  20 mg Oral Daily  . QUEtiapine  600 mg Oral QHS  . traZODone  200 mg Oral QHS   Continuous Infusions: . dextrose 5 % and 0.9% NaCl 100 mL/hr at 03/29/16 0956     LOS: 1 day      Tawni Millers, MD [facility] Pager [phone]  If 7PM-7AM, please contact night-coverage [email or web address] Password [facility] 03/29/2016, 12:43 PM

## 2016-03-29 NOTE — Progress Notes (Signed)
Subjective: She looks fine some flatus, no stool. Once the NG out, drainage currently looks like ice chips.  Objective: Vital signs in last 24 hours: Temp:  [97.8 F (36.6 C)-98.4 F (36.9 C)] 97.8 F (36.6 C) (09/01 0800) Pulse Rate:  [68-87] 68 (09/01 0947) Resp:  [17-23] 22 (09/01 0800) BP: (131-170)/(91-113) 164/91 (09/01 0947) SpO2:  [95 %-100 %] 96 % (09/01 0947) Last BM Date: 03/27/16 SB Protocol shows ongoing SBO 950 from the NG 600 urine second shift yesterday Afebrile, VSS; BP is up still K+ 3.4Other labs ok  Intake/Output from previous day: 08/31 0701 - 09/01 0700 In: 515 [P.O.:30; I.V.:485] Out: 1550 [Urine:600; Emesis/NG output:950] Intake/Output this shift: Total I/O In: -  Out: 250 [Urine:250]  General appearance: alert, cooperative and no distress GI: Up in chair, still has decreased bowel sounds, some flatus no BM. She is nontender and not complaining of any pain.  Lab Results:   Recent Labs  03/28/16 0439 03/29/16 0526  WBC 7.1 8.1  HGB 13.1 12.6  HCT 40.8 39.5  PLT 312 349    BMET  Recent Labs  03/28/16 0439 03/29/16 0526  NA 141 137  K 3.5 3.4*  CL 109 106  CO2 27 24  GLUCOSE 112* 134*  BUN 8 7  CREATININE 0.99 0.86  CALCIUM 9.4 9.1   PT/INR No results for input(s): LABPROT, INR in the last 72 hours.   Recent Labs Lab 03/28/16 0439 03/29/16 0526  AST 15 14*  ALT 13* 14  ALKPHOS 57 48  BILITOT 0.4 0.6  PROT 8.0 7.3  ALBUMIN 4.2 3.9     Lipase     Component Value Date/Time   LIPASE 23 03/28/2016 0439     Studies/Results: Ct Abdomen Pelvis W Contrast  Result Date: 03/28/2016 CLINICAL DATA:  Right lower quadrant pain for 2 days. EXAM: CT ABDOMEN AND PELVIS WITH CONTRAST TECHNIQUE: Multidetector CT imaging of the abdomen and pelvis was performed using the standard protocol following bolus administration of intravenous contrast. CONTRAST:  177mL ISOVUE-300 IOPAMIDOL (ISOVUE-300) INJECTION 61% COMPARISON:  09/08/2014  FINDINGS: Lower chest:  No significant abnormality Hepatobiliary: There are normal appearances of the liver, gallbladder and bile ducts. Pancreas: Normal Spleen: Small unchanged focal hypodense lesion measuring approximately 2 cm. This is indeterminate but more likely benign. Otherwise normal spleen. Adrenals/Urinary Tract: The adrenals and kidneys are normal in appearance. There is no urinary calculus evident. There is no hydronephrosis or ureteral dilatation. Collecting systems and ureters appear unremarkable. Stomach/Bowel: There is abnormal dilatation of mid to distal small bowel with abrupt transition to decompressed distal ileum. The transition point is at a small bowel anastomosis in the anterior pelvic midline. Colon is unremarkable. Stomach is unremarkable. No extraluminal air. No ascites. Vascular/Lymphatic: The abdominal aorta is normal in caliber. There is no atherosclerotic calcification. There is no adenopathy in the abdomen or pelvis. Reproductive: Hysterectomy.  No adnexal abnormalities. Other: Musculoskeletal: No significant skeletal lesion. IMPRESSION: Abnormal dilatation of small bowel with abrupt caliber transition at the distal small bowel anastomosis, consistent with a moderate grade small bowel obstruction. No extraluminal air. Electronically Signed   By: Andreas Newport M.D.   On: 03/28/2016 06:40   Dg Abd 2 Views  Result Date: 03/29/2016 CLINICAL DATA:  Small-bowel obstruction.  Abdominal pain. EXAM: ABDOMEN - 2 VIEW COMPARISON:  03/28/2016. FINDINGS: NG tube noted in the stomach. Residual oral contrast in stomach. No definite contrast noted in bowel loops. Dilated loops of small bowel are again noted consistent with  small bowel obstruction. No pneumatosis. No free air. No acute bony abnormality. IMPRESSION: 1. NG tube noted with tip in stomach. Residual contrast noted in the stomach. No significant volume of contrast noted in the small bowel loops. 2. Persistent dilated loops of small  bowel consistent small bowel obstruction. No free air. Electronically Signed   By: Marcello Moores  Register   On: 03/29/2016 09:34   Dg Abd Portable 1v-small Bowel Obstruction Protocol-initial, 8 Hr Delay  Result Date: 03/28/2016 CLINICAL DATA:  Small bowel protocol.  8 hour delayed image. EXAM: PORTABLE ABDOMEN - 1 VIEW COMPARISON:  03/28/2016 at 7:58 a.m. FINDINGS: Contrast injected through the nasogastric tube poles within the gastric fundus. No contrast is seen within the small bowel. IMPRESSION: Findings support a high-grade small bowel obstruction. Contrast remains in the stomach. Electronically Signed   By: Lajean Manes M.D.   On: 03/28/2016 17:01   Dg Abd Portable 1v-small Bowel Protocol-position Verification  Result Date: 03/28/2016 CLINICAL DATA:  NG tube placement for small bowel protocol EXAM: PORTABLE ABDOMEN - 1 VIEW COMPARISON:  CT scan of the abdomen and pelvis of today's date FINDINGS: The esophagogastric tube tip projects in the region of the gastric body but the proximal port is at or just below the GE junction. There remain a few mildly distended small bowel loops. The stool burden in the colon is moderate. There is contrast within the renal collecting systems bilaterally. IMPRESSION: Advancement of the nasogastric tube by approximately 10 cm is recommended to assure that the proximal port remains below the GE junction. Electronically Signed   By: David  Martinique M.D.   On: 03/28/2016 08:14    Medications: . atenolol  100 mg Oral BID  . budesonide  0.25 mg Nebulization BID  . cloNIDine  0.3 mg Oral BID  . enoxaparin (LOVENOX) injection  40 mg Subcutaneous Q24H  . PARoxetine  20 mg Oral Daily  . QUEtiapine  600 mg Oral QHS  . traZODone  200 mg Oral QHS   . dextrose 5 % and 0.9% NaCl 100 mL/hr at 03/29/16 0956    Hypertension Asthma Bipolar Migraines  BMI 44   Assessment/Plan SBO s/p ATTEMPTED LAPAROSCOPIC VAGINAL HYSTERECTOMY, EXTENSIVE ENTEROLYSIS, EXTENSIVE ADHESIOLYSIS,  BILATERAL OOPHERECTOMY, RIGHT SALPINGECTOMY, Small bowel injury with SBR. Dr Clovia Cuff 07/19/14 FEN:  NPO/IV fluids ID:  None DVT:  Lovenox   Plan: Films show no movement of the contrast. I will have him ambulate her. Removing NG at this point seems premature.    LOS: 1 day    Courtenay Hirth 03/29/2016 [phone]

## 2016-03-30 ENCOUNTER — Inpatient Hospital Stay (HOSPITAL_COMMUNITY): Payer: Self-pay

## 2016-03-30 LAB — CBC WITH DIFFERENTIAL/PLATELET
BASOS PCT: 0 %
Basophils Absolute: 0 10*3/uL (ref 0.0–0.1)
Eosinophils Absolute: 0 10*3/uL (ref 0.0–0.7)
Eosinophils Relative: 0 %
HEMATOCRIT: 38.9 % (ref 36.0–46.0)
Hemoglobin: 12.5 g/dL (ref 12.0–15.0)
Lymphocytes Relative: 10 %
Lymphs Abs: 0.9 10*3/uL (ref 0.7–4.0)
MCH: 26.3 pg (ref 26.0–34.0)
MCHC: 32.1 g/dL (ref 30.0–36.0)
MCV: 81.9 fL (ref 78.0–100.0)
MONO ABS: 0.2 10*3/uL (ref 0.1–1.0)
MONOS PCT: 2 %
NEUTROS ABS: 7.8 10*3/uL — AB (ref 1.7–7.7)
Neutrophils Relative %: 88 %
Platelets: 313 10*3/uL (ref 150–400)
RBC: 4.75 MIL/uL (ref 3.87–5.11)
RDW: 14.4 % (ref 11.5–15.5)
WBC: 8.9 10*3/uL (ref 4.0–10.5)

## 2016-03-30 LAB — BASIC METABOLIC PANEL
Anion gap: 7 (ref 5–15)
BUN: 5 mg/dL — AB (ref 6–20)
CALCIUM: 9.3 mg/dL (ref 8.9–10.3)
CO2: 24 mmol/L (ref 22–32)
CREATININE: 0.73 mg/dL (ref 0.44–1.00)
Chloride: 107 mmol/L (ref 101–111)
GFR calc non Af Amer: >60 mL/min (ref >60)
GLUCOSE: 138 mg/dL — AB (ref 65–99)
Potassium: 3.8 mmol/L (ref 3.5–5.1)
Sodium: 138 mmol/L (ref 135–145)

## 2016-03-30 LAB — GLUCOSE, CAPILLARY
GLUCOSE-CAPILLARY: 131 mg/dL — AB (ref 65–99)
Glucose-Capillary: 121 mg/dL — ABNORMAL HIGH (ref 65–99)
Glucose-Capillary: 121 mg/dL — ABNORMAL HIGH (ref 65–99)
Glucose-Capillary: 123 mg/dL — ABNORMAL HIGH (ref 65–99)
Glucose-Capillary: 167 mg/dL — ABNORMAL HIGH (ref 65–99)
Glucose-Capillary: 175 mg/dL — ABNORMAL HIGH (ref 65–99)

## 2016-03-30 MED ORDER — PAROXETINE HCL 20 MG PO TABS
20.0000 mg | ORAL_TABLET | Freq: Every day | ORAL | Status: DC
  Administered 2016-03-30 – 2016-04-01 (×3): 20 mg via ORAL
  Filled 2016-03-30 (×3): qty 1

## 2016-03-30 MED ORDER — TRAZODONE HCL 100 MG PO TABS
200.0000 mg | ORAL_TABLET | Freq: Every day | ORAL | Status: DC
  Administered 2016-03-30 – 2016-03-31 (×2): 200 mg via ORAL
  Filled 2016-03-30 (×2): qty 2

## 2016-03-30 MED ORDER — METOCLOPRAMIDE HCL 5 MG/ML IJ SOLN
5.0000 mg | Freq: Once | INTRAMUSCULAR | Status: AC
  Administered 2016-03-30: 5 mg via INTRAVENOUS
  Filled 2016-03-30: qty 2

## 2016-03-30 MED ORDER — SUMATRIPTAN SUCCINATE 25 MG PO TABS
25.0000 mg | ORAL_TABLET | ORAL | Status: DC | PRN
  Administered 2016-03-31 (×2): 25 mg via ORAL
  Filled 2016-03-30 (×4): qty 1

## 2016-03-30 MED ORDER — QUETIAPINE FUMARATE 100 MG PO TABS
600.0000 mg | ORAL_TABLET | Freq: Every day | ORAL | Status: DC
  Administered 2016-03-30 – 2016-03-31 (×2): 600 mg via ORAL
  Filled 2016-03-30: qty 6

## 2016-03-30 MED ORDER — METOCLOPRAMIDE HCL 5 MG/5ML PO SOLN
5.0000 mg | Freq: Once | ORAL | Status: DC
  Filled 2016-03-30: qty 5

## 2016-03-30 NOTE — Progress Notes (Addendum)
[facility] Office:  [phone] General Surgery Progress Note   LOS: 2 days  POD -     Assessment/Plan: 1.  SBO - probably secondary to adhesions  History of bowel injury during GYN surgery around 06/2014 that required SB resection ([place])  KUB today shows contrast in colon - but still has a loop of small bowel - over all better  Will remove NGT and start sips from floor.  Needs to ambulate more.  Apparently she became light headed the other day while walking.  2.  DM  Glucose - 138 - 03/30/2016 3.  Bipolar disorder 4.  OSA 5.  Morbid obesity 6.  DVT prophylaxis - Lovenox   Principal Problem:   SBO (small bowel obstruction) (HCC) Active Problems:   Diabetes mellitus type 2 in obese ([place])   BIPOLAR DISORDER UNSPECIFIED   Anxiety state   Obstructive sleep apnea   Asthma   Morbid obesity ([place])   Small bowel obstruction (HCC)   Subjective:  Had a BM.  Feels better, though still sore in the right lower abdomen.  Objective:   Vitals:   03/29/16 2139 03/30/16 0450  BP: (!) 140/96 (!) 151/81  Pulse: 67 63  Resp: 20 20  Temp: 98.4 F (36.9 C) 98.2 F (36.8 C)     Intake/Output from previous day:  09/01 0701 - 09/02 0700 In: 2180 [I.V.:1130; IV Piggyback:1050] Out: 2065 [Urine:1425; Emesis/NG output:640]  Intake/Output this shift:  Total I/O In: -  Out: 650 [Urine:650]   Physical Exam:   General: Obese AA F who is alert and oriented.    HEENT: Normal. Pupils equal. .   Lungs: Clear   Abdomen: Sore in RLQ.  BS present.  Obese.   Lab Results:    Recent Labs  03/29/16 0526 03/30/16 0538  WBC 8.1 8.9  HGB 12.6 12.5  HCT 39.5 38.9  PLT 349 313    BMET   Recent Labs  03/29/16 0526 03/30/16 0538  NA 137 138  K 3.4* 3.8  CL 106 107  CO2 24 24  GLUCOSE 134* 138*  BUN 7 5*  CREATININE 0.86 0.73  CALCIUM 9.1 9.3    PT/INR  No results for input(s): LABPROT, INR in the last 72 hours.  ABG  No results for input(s): PHART, HCO3 in  the last 72 hours.  Invalid input(s): PCO2, PO2   Studies/Results:  Dg Abd 2 Views  Result Date: 03/30/2016 CLINICAL DATA:  Small bowel obstruction. EXAM: ABDOMEN - 2 VIEW COMPARISON:  03/29/2016 FINDINGS: Partial small bowel obstruction has essentially resolved with no significant dilated small bowel loops identified. There is contrast and air within the colon. Nasogastric tube extends into the proximal stomach. No free air. IMPRESSION: Essential resolution of partial small bowel obstruction by radiography. Electronically Signed   By: Aletta Edouard M.D.   On: 03/30/2016 09:29   Dg Abd 2 Views  Result Date: 03/29/2016 CLINICAL DATA:  Small-bowel obstruction.  Abdominal pain. EXAM: ABDOMEN - 2 VIEW COMPARISON:  03/28/2016. FINDINGS: NG tube noted in the stomach. Residual oral contrast in stomach. No definite contrast noted in bowel loops. Dilated loops of small bowel are again noted consistent with small bowel obstruction. No pneumatosis. No free air. No acute bony abnormality. IMPRESSION: 1. NG tube noted with tip in stomach. Residual contrast noted in the stomach. No significant volume of contrast noted in the small bowel loops. 2. Persistent dilated loops of small bowel consistent small bowel obstruction. No free air. Electronically Signed  By: [place]   On: 03/29/2016 09:34   Dg Abd Portable 1v-small Bowel Obstruction Protocol-initial, 8 Hr Delay  Result Date: 03/28/2016 CLINICAL DATA:  Small bowel protocol.  8 hour delayed image. EXAM: PORTABLE ABDOMEN - 1 VIEW COMPARISON:  03/28/2016 at 7:58 a.m. FINDINGS: Contrast injected through the nasogastric tube poles within the gastric fundus. No contrast is seen within the small bowel. IMPRESSION: Findings support a high-grade small bowel obstruction. Contrast remains in the stomach. Electronically Signed   By: Lajean Manes M.D.   On: 03/28/2016 17:01     Anti-infectives:   Anti-infectives    None      Alphonsa Overall, MD, FACS Pager:  [phone] Surgery Office: [phone] 03/30/2016

## 2016-03-30 NOTE — Progress Notes (Signed)
PROGRESS NOTE    Patricia [name]  [id number] DOB: 05-Jun-1971 DOA: 03/28/2016 PCP: Carmie Kanner, NP    Brief Narrative:  Small bowel obstruction. 45 yo female with prior abdominal surgeries and htn on clonidine. On supportive medical therapy, NG tube in place. Persistent obstruction since admission. May need surgical intervention.    Assessment & Plan:   Principal Problem:   SBO (small bowel obstruction) (HCC) Active Problems:   Diabetes mellitus type 2 in obese ([place])   BIPOLAR DISORDER UNSPECIFIED   Anxiety state   Obstructive sleep apnea   Asthma   Morbid obesity ([place])   Small bowel obstruction ([place])  1. Small bowel obstruction. Will continue supportive care,  will continue NG tube to low intermittent suction. Continue pain control with hydromorphone, ketorolac, acetaminophen. Anti-emetics and antiacid therapy. Follow repeat abdominal films today, follow transit of contrast.   2. Hypertension. Blood pressure systolic [id number] to [id number], will continue clonidine, atenolol and as needed enalaprilat and hydralazine. Continue IV fluids at a lowe rate 75 ml.hr, to prevent volume overload.   3. Asthma. Stable, will continue oxymetry monitor and as needed supplemental 02 per [place], avoid volume overload. Continue budesonide and as needed duonebs.   4, DM. Patient not on antihyperglycemic agents at home, but past past medical history of DM type 2, will start patient on insulin per sliding scale and glucose monitor. Serum glucose 175-167-123. Continue NPO. Conitnue dextrose solutions IV for now.   5. Bipolar. Continue seroquel, paroxetine and trazodone.  6. Migraine headache. Patient had dexamethasone, reglan and benadryl last night. Will resume imitrex and will add reglan IV one dose.   Patient continue at moderate risk for worsening small bowel obstruction.   DVT prophylaxis: lovenox Code Status: full Family Communication: patient's family at the bedside all questions were  addressed. Disposition Plan:  Home   Consultants:   surgery   Procedures:    Antimicrobials:    Subjective: Patient had severe headache this am, c/w her usual migraine attack, improved with benadryl, reglan and dexamethasone. No nausea or vomiting, ng tube in place. Positive flatus but no bowel movement. Stable abdominal pain.   Objective: Vitals:   03/29/16 1140 03/29/16 1447 03/29/16 2139 03/30/16 0450  BP: (!) 150/93 (!) 144/104 (!) 140/96 (!) 151/81  Pulse: 67 62 67 63  Resp:  (!) 22 20 20   Temp: 97.9 F (36.6 C)  98.4 F (36.9 C) 98.2 F (36.8 C)  TempSrc: Oral  Oral Oral  SpO2: 99% 100% 99% 95%    Intake/Output Summary (Last 24 hours) at 03/30/16 0843 Last data filed at 03/30/16 [id number]  Gross per 24 hour  Intake             2180 ml  Output             2715 ml  Net             -535 ml   There were no vitals filed for this visit.  Examination:  General exam: deconditioned and in pain. E ENT: oral mucosa moist, no conjunctival pallor or icterus. Respiratory system: Clear to auscultation. Respiratory effort normal. Mild decreased breath sounds at bases, no wheezing, rales or rhonchi. Cardiovascular system: S1 & S2 heard, RRR. No JVD, murmurs, rubs, gallops or clicks. No pedal edema. Gastrointestinal system: Abdomen is distended and tender to deep palpation. No organomegaly or masses felt. Normal bowel sounds decreased.  Central nervous system: Alert and oriented. No focal neurological deficits. Extremities: Symmetric 5 x 5  power. Skin: No rashes, lesions or ulcers     Data Reviewed: I have personally reviewed following labs and imaging studies  CBC:  Recent Labs Lab 03/28/16 0439 03/29/16 0526 03/30/16 0538  WBC 7.1 8.1 8.9  NEUTROABS 4.2  --  7.8*  HGB 13.1 12.6 12.5  HCT 40.8 39.5 38.9  MCV 82.8 81.6 81.9  PLT 312 349 [id number]   Basic Metabolic Panel:  Recent Labs Lab 03/28/16 0439 03/29/16 0526 03/30/16 0538  NA 141 137 138  K 3.5 3.4* 3.8    CL 109 106 107  CO2 27 24 24   GLUCOSE 112* 134* 138*  BUN 8 7 5*  CREATININE 0.99 0.86 0.73  CALCIUM 9.4 9.1 9.3   GFR: CrCl cannot be calculated (Unknown ideal weight.). Liver Function Tests:  Recent Labs Lab 03/28/16 0439 03/29/16 0526  AST 15 14*  ALT 13* 14  ALKPHOS 57 48  BILITOT 0.4 0.6  PROT 8.0 7.3  ALBUMIN 4.2 3.9    Recent Labs Lab 03/28/16 0439  LIPASE 23   No results for input(s): AMMONIA in the last 168 hours. Coagulation Profile: No results for input(s): INR, PROTIME in the last 168 hours. Cardiac Enzymes: No results for input(s): CKTOTAL, CKMB, CKMBINDEX, TROPONINI in the last 168 hours. BNP (last 3 results) No results for input(s): PROBNP in the last 8760 hours. HbA1C: No results for input(s): HGBA1C in the last 72 hours. CBG:  Recent Labs Lab 03/29/16 1302 03/29/16 1530 03/29/16 2134 03/30/16 0015 03/30/16 0435  GLUCAP 132* 110* 136* 175* 167*   Lipid Profile: No results for input(s): CHOL, HDL, LDLCALC, TRIG, CHOLHDL, LDLDIRECT in the last 72 hours. Thyroid Function Tests: No results for input(s): TSH, T4TOTAL, FREET4, T3FREE, THYROIDAB in the last 72 hours. Anemia Panel: No results for input(s): VITAMINB12, FOLATE, FERRITIN, TIBC, IRON, RETICCTPCT in the last 72 hours. Sepsis Labs: No results for input(s): PROCALCITON, LATICACIDVEN in the last 168 hours.  No results found for this or any previous visit (from the past 240 hour(s)).       Radiology Studies: Dg Abd 2 Views  Result Date: 03/29/2016 CLINICAL DATA:  Small-bowel obstruction.  Abdominal pain. EXAM: ABDOMEN - 2 VIEW COMPARISON:  03/28/2016. FINDINGS: NG tube noted in the stomach. Residual oral contrast in stomach. No definite contrast noted in bowel loops. Dilated loops of small bowel are again noted consistent with small bowel obstruction. No pneumatosis. No free air. No acute bony abnormality. IMPRESSION: 1. NG tube noted with tip in stomach. Residual contrast noted in  the stomach. No significant volume of contrast noted in the small bowel loops. 2. Persistent dilated loops of small bowel consistent small bowel obstruction. No free air. Electronically Signed   By: Marcello Moores  Register   On: 03/29/2016 09:34   Dg Abd Portable 1v-small Bowel Obstruction Protocol-initial, 8 Hr Delay  Result Date: 03/28/2016 CLINICAL DATA:  Small bowel protocol.  8 hour delayed image. EXAM: PORTABLE ABDOMEN - 1 VIEW COMPARISON:  03/28/2016 at 7:58 a.m. FINDINGS: Contrast injected through the nasogastric tube poles within the gastric fundus. No contrast is seen within the small bowel. IMPRESSION: Findings support a high-grade small bowel obstruction. Contrast remains in the stomach. Electronically Signed   By: Lajean Manes M.D.   On: 03/28/2016 17:01        Scheduled Meds: . budesonide  0.25 mg Nebulization BID  . enoxaparin (LOVENOX) injection  40 mg Subcutaneous Q24H  . famotidine (PEPCID) IV  20 mg Intravenous Q12H  . insulin aspart  0-9 Units Subcutaneous Q4H  . lip balm  1 application Topical BID   Continuous Infusions: . dextrose 5 % and 0.9% NaCl 100 mL/hr at 03/29/16 0956     LOS: 2 days        Tawni Millers, MD [facility] Pager [phone]  If 7PM-7AM, please contact night-coverage [email or web address] Password [facility] 03/30/2016, 8:43 AM

## 2016-03-31 DIAGNOSIS — E876 Hypokalemia: Secondary | ICD-10-CM

## 2016-03-31 LAB — CBC WITH DIFFERENTIAL/PLATELET
BASOS ABS: 0 10*3/uL (ref 0.0–0.1)
Basophils Relative: 0 %
Eosinophils Absolute: 0.1 10*3/uL (ref 0.0–0.7)
Eosinophils Relative: 1 %
HEMATOCRIT: 38 % (ref 36.0–46.0)
HEMOGLOBIN: 12.2 g/dL (ref 12.0–15.0)
LYMPHS PCT: 35 %
Lymphs Abs: 2.3 10*3/uL (ref 0.7–4.0)
MCH: 26.3 pg (ref 26.0–34.0)
MCHC: 32.1 g/dL (ref 30.0–36.0)
MCV: 81.9 fL (ref 78.0–100.0)
Monocytes Absolute: 0.5 10*3/uL (ref 0.1–1.0)
Monocytes Relative: 8 %
NEUTROS ABS: 3.7 10*3/uL (ref 1.7–7.7)
NEUTROS PCT: 56 %
PLATELETS: 294 10*3/uL (ref 150–400)
RBC: 4.64 MIL/uL (ref 3.87–5.11)
RDW: 14.4 % (ref 11.5–15.5)
WBC: 6.6 10*3/uL (ref 4.0–10.5)

## 2016-03-31 LAB — BASIC METABOLIC PANEL
ANION GAP: 9 (ref 5–15)
BUN: 7 mg/dL (ref 6–20)
CO2: 26 mmol/L (ref 22–32)
Calcium: 9.2 mg/dL (ref 8.9–10.3)
Chloride: 106 mmol/L (ref 101–111)
Creatinine, Ser: 0.9 mg/dL (ref 0.44–1.00)
GFR calc Af Amer: >60 mL/min (ref >60)
GLUCOSE: 110 mg/dL — AB (ref 65–99)
POTASSIUM: 3 mmol/L — AB (ref 3.5–5.1)
Sodium: 141 mmol/L (ref 135–145)

## 2016-03-31 LAB — GLUCOSE, CAPILLARY
GLUCOSE-CAPILLARY: 137 mg/dL — AB (ref 65–99)
GLUCOSE-CAPILLARY: 137 mg/dL — AB (ref 65–99)
GLUCOSE-CAPILLARY: 149 mg/dL — AB (ref 65–99)
GLUCOSE-CAPILLARY: 99 mg/dL (ref 65–99)
Glucose-Capillary: 121 mg/dL — ABNORMAL HIGH (ref 65–99)
Glucose-Capillary: 120 mg/dL — ABNORMAL HIGH (ref 65–99)

## 2016-03-31 MED ORDER — ATENOLOL 25 MG PO TABS
100.0000 mg | ORAL_TABLET | Freq: Two times a day (BID) | ORAL | Status: DC
  Administered 2016-03-31 – 2016-04-01 (×3): 100 mg via ORAL
  Filled 2016-03-31 (×3): qty 4

## 2016-03-31 MED ORDER — POTASSIUM CHLORIDE 10 MEQ/100ML IV SOLN
10.0000 meq | INTRAVENOUS | Status: AC
  Administered 2016-03-31 (×4): 10 meq via INTRAVENOUS
  Filled 2016-03-31 (×4): qty 100

## 2016-03-31 MED ORDER — POTASSIUM CITRATE ER 10 MEQ (1080 MG) PO TBCR
10.0000 meq | EXTENDED_RELEASE_TABLET | Freq: Three times a day (TID) | ORAL | Status: DC
  Administered 2016-03-31: 10 meq via ORAL
  Filled 2016-03-31 (×3): qty 1

## 2016-03-31 MED ORDER — CLONIDINE HCL 0.1 MG PO TABS
0.3000 mg | ORAL_TABLET | Freq: Two times a day (BID) | ORAL | Status: DC
  Administered 2016-03-31 – 2016-04-01 (×3): 0.3 mg via ORAL
  Filled 2016-03-31 (×3): qty 3

## 2016-03-31 NOTE — Progress Notes (Signed)
PROGRESS NOTE    Patricia [name]  [id number] DOB: September 21, 1970 DOA: 03/28/2016 PCP: Carmie Kanner, NP    Brief Narrative:  Small bowel obstruction. 45 yo female with prior abdominal surgeries and htn on clonidine. On supportive medical therapy, NG tube in place. Resoling sbo, diet advanced, K repletion, pain controlled. Plan for possible discharge in am.   Assessment & Plan:   Principal Problem:   SBO (small bowel obstruction) (HCC) Active Problems:   Diabetes mellitus type 2 in obese ([place])   BIPOLAR DISORDER UNSPECIFIED   Anxiety state   Obstructive sleep apnea   Asthma   Morbid obesity ([place])   Small bowel obstruction ([place])  1. Small bowel obstruction. Radiographicaly improved, ng tube has been removed and diet has been advanced  Continue pain control with hydromorphone, ketorolac, acetaminophen. Anti-emetics and antiacid therapy.  Will plan to continue to advance diet during the day today.   2. Hypertension. Blood pressure systolic 0000000 with diastolic up to [id number],  will continue clonidine, atenolol and as needed enalaprilat and hydralazine. Will hold on IV fluids.   3. Asthma. Stable, will continue oxymetry monitor and as needed supplemental 02 per [place]. Continue budesonide and as needed duonebs. Out of bed as tolerated.   4, DM type 2. Will continue glucose cover and monitor with insulin sliding scale, serum glucose 121-120-137. Will continue to advance diet as tolerated.  5. Bipolar. Continue seroquel, paroxetine and trazodone.  6. Migraine headache.  Resolved.   7. Hypokalemia. Will continue to replete with po kcl and IV kcl, follow renal panel and electrolytes in am. Will hold on IV fluids, diet has been advanced.   Patient continue at moderate risk for recurrent small bowel obstruction.    DVT prophylaxis:lovenox Code Status:full Family Communication:patient's family at the bedside all questions were addressed. Disposition Plan:Home   Consultants:    Surgery  Procedures:    Antimicrobials:   Subjective: Patient feeling much better, headache improved, no nausea or vomiting, positive flatus. Diet has been advanced. Abdominal pain on the right lower quadrant improved. Reports polyuria.   Objective: Vitals:   03/30/16 2025 03/30/16 2200 03/31/16 0500 03/31/16 0837  BP: (!) 188/101 (!) 145/95 131/71   Pulse: 60  62   Resp: 17  18   Temp: 98.6 F (37 C)  97.7 F (36.5 C)   TempSrc: Oral  Oral   SpO2: 99%  99% 97%  Weight:  118 kg (260 lb 2.3 oz)    Height:  5\' 4"  (1.626 m)      Intake/Output Summary (Last 24 hours) at 03/31/16 1111 Last data filed at 03/31/16 0755  Gross per 24 hour  Intake              600 ml  Output             2050 ml  Net            -1450 ml   Filed Weights   03/30/16 2200  Weight: 118 kg (260 lb 2.3 oz)    Examination:  General exam: not in pain or dyspnea E ENT: no conjunctival pallor, oral mucosa moist.  Respiratory system:. Respiratory effort normal. Mild decreased breath sounds at bases. No wheezing, rales or rhonchi.  Cardiovascular system: S1 & S2 heard, RRR. No JVD, murmurs, rubs, gallops or clicks. No pedal edema. Gastrointestinal system: Abdomen is mild distended, tender to deep palpation at the right lower quadrant. No organomegaly or masses felt. Normal bowel sounds decreased.  Central  nervous system: Alert and oriented. No focal neurological deficits. Extremities: Symmetric 5 x 5 power. Skin: No rashes, lesions or ulcers      Data Reviewed: I have personally reviewed following labs and imaging studies  CBC:  Recent Labs Lab 03/28/16 0439 03/29/16 0526 03/30/16 0538 03/31/16 0550  WBC 7.1 8.1 8.9 6.6  NEUTROABS 4.2  --  7.8* 3.7  HGB 13.1 12.6 12.5 12.2  HCT 40.8 39.5 38.9 38.0  MCV 82.8 81.6 81.9 81.9  PLT 312 349 313 [id number]   Basic Metabolic Panel:  Recent Labs Lab 03/28/16 0439 03/29/16 0526 03/30/16 0538 03/31/16 0550  NA 141 137 138 141  K 3.5 3.4* 3.8  3.0*  CL 109 106 107 106  CO2 27 24 24 26   GLUCOSE 112* 134* 138* 110*  BUN 8 7 5* 7  CREATININE 0.99 0.86 0.73 0.90  CALCIUM 9.4 9.1 9.3 9.2   GFR: Estimated Creatinine Clearance: 98.6 mL/min (by C-G formula based on SCr of 0.9 mg/dL). Liver Function Tests:  Recent Labs Lab 03/28/16 0439 03/29/16 0526  AST 15 14*  ALT 13* 14  ALKPHOS 57 48  BILITOT 0.4 0.6  PROT 8.0 7.3  ALBUMIN 4.2 3.9    Recent Labs Lab 03/28/16 0439  LIPASE 23   No results for input(s): AMMONIA in the last 168 hours. Coagulation Profile: No results for input(s): INR, PROTIME in the last 168 hours. Cardiac Enzymes: No results for input(s): CKTOTAL, CKMB, CKMBINDEX, TROPONINI in the last 168 hours. BNP (last 3 results) No results for input(s): PROBNP in the last 8760 hours. HbA1C: No results for input(s): HGBA1C in the last 72 hours. CBG:  Recent Labs Lab 03/30/16 1654 03/30/16 2030 03/31/16 0015 03/31/16 0402 03/31/16 0735  GLUCAP 131* 121* 137* 121* 120*   Lipid Profile: No results for input(s): CHOL, HDL, LDLCALC, TRIG, CHOLHDL, LDLDIRECT in the last 72 hours. Thyroid Function Tests: No results for input(s): TSH, T4TOTAL, FREET4, T3FREE, THYROIDAB in the last 72 hours. Anemia Panel: No results for input(s): VITAMINB12, FOLATE, FERRITIN, TIBC, IRON, RETICCTPCT in the last 72 hours. Sepsis Labs: No results for input(s): PROCALCITON, LATICACIDVEN in the last 168 hours.  No results found for this or any previous visit (from the past 240 hour(s)).       Radiology Studies: Dg Abd 2 Views  Result Date: 03/30/2016 CLINICAL DATA:  Small bowel obstruction. EXAM: ABDOMEN - 2 VIEW COMPARISON:  03/29/2016 FINDINGS: Partial small bowel obstruction has essentially resolved with no significant dilated small bowel loops identified. There is contrast and air within the colon. Nasogastric tube extends into the proximal stomach. No free air. IMPRESSION: Essential resolution of partial small bowel  obstruction by radiography. Electronically Signed   By: Aletta Edouard M.D.   On: 03/30/2016 09:29        Scheduled Meds: . budesonide  0.25 mg Nebulization BID  . enoxaparin (LOVENOX) injection  40 mg Subcutaneous Q24H  . famotidine (PEPCID) IV  20 mg Intravenous Q12H  . insulin aspart  0-9 Units Subcutaneous Q4H  . lip balm  1 application Topical BID  . PARoxetine  20 mg Oral Daily  . potassium chloride  10 mEq Intravenous Q1 Hr x 4  . potassium citrate  10 mEq Oral TID WC  . QUEtiapine  600 mg Oral QHS  . traZODone  200 mg Oral QHS   Continuous Infusions:    LOS: 3 days        Akhila Mahnken Gerome Apley, MD [facility] Pager [phone]  If 7PM-7AM, please contact night-coverage [email or web address] Password [email or web address] 03/31/2016, 11:11 AM

## 2016-03-31 NOTE — Progress Notes (Signed)
Patient's fiance stated "until she eats something her headache won't stop." I explained that the patient cannot eat something until the doctor authorizes and explained the importance of progressing the diet slowly and the consequences of attempting PO intake too early. The fiance responded "well if y'all don't give her something I'll get her something myself." In speaking with other nurses who have directly cared for this patient, the fiance has thretened this multiple times over the past few days. This nurse addressed this issue directly with the patient.

## 2016-03-31 NOTE — Progress Notes (Signed)
[facility] Office:  [phone] General Surgery Progress Note   LOS: 3 days  POD -     Assessment/Plan: 1.  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  She needs to ambulate more - she only walked in hall once yesterday.   2.  DM  Glucose - 110 - 03/31/2016 3.  Bipolar disorder 4.  OSA 5.  Morbid obesity 6.  DVT prophylaxis - Lovenox 7.  Hypokalemia -   K+ - 3.0 - 03/31/2016   Principal Problem:   SBO (small bowel obstruction) (HCC) Active Problems:   Diabetes mellitus type 2 in obese ([place])   BIPOLAR DISORDER UNSPECIFIED   Anxiety state   Obstructive sleep apnea   Asthma   Morbid obesity ([place])   Small bowel obstruction (HCC)   Subjective:  Passing flatus, no more BM's.  She says, "I'm having hunger pains in my head".  She badly wants something to eat.  She still has some soreness in the RLQ.  Marily Lente, at bedside.  Objective:   Vitals:   03/30/16 2200 03/31/16 0500  BP: (!) 145/95 131/71  Pulse:  62  Resp:  18  Temp:  97.7 F (36.5 C)     Intake/Output from previous day:  09/02 0701 - 09/03 0700 In: 600 [P.O.:600] Out: 2100 [Urine:2100]  Intake/Output this shift:  Total I/O In: -  Out: 300 [Urine:300]   Physical Exam:   General: Obese AA F who is alert and oriented.    HEENT: Normal. Pupils equal. .   Lungs: Clear   Abdomen: Some soreness in RLQ.  BS present.  Obese.   Lab Results:     Recent Labs  03/30/16 0538 03/31/16 0550  WBC 8.9 6.6  HGB 12.5 12.2  HCT 38.9 38.0  PLT 313 294    BMET    Recent Labs  03/30/16 0538 03/31/16 0550  NA 138 141  K 3.8 3.0*  CL 107 106  CO2 24 26  GLUCOSE 138* 110*  BUN 5* 7  CREATININE 0.73 0.90  CALCIUM 9.3 9.2    PT/INR  No results for input(s): LABPROT, INR in the last  72 hours.  ABG  No results for input(s): PHART, HCO3 in the last 72 hours.  Invalid input(s): PCO2, PO2   Studies/Results:  Dg Abd 2 Views  Result Date: 03/30/2016 CLINICAL DATA:  Small bowel obstruction. EXAM: ABDOMEN - 2 VIEW COMPARISON:  03/29/2016 FINDINGS: Partial small bowel obstruction has essentially resolved with no significant dilated small bowel loops identified. There is contrast and air within the colon. Nasogastric tube extends into the proximal stomach. No free air. IMPRESSION: Essential resolution of partial small bowel obstruction by radiography. Electronically Signed   By: Aletta Edouard M.D.   On: 03/30/2016 09:29   Dg Abd 2 Views  Result Date: 03/29/2016 CLINICAL DATA:  Small-bowel obstruction.  Abdominal pain. EXAM: ABDOMEN - 2 VIEW COMPARISON:  03/28/2016. FINDINGS: NG tube noted in the stomach. Residual oral contrast in stomach. No definite contrast noted in bowel loops. Dilated loops of small bowel are again noted consistent with small bowel obstruction. No pneumatosis. No free air. No acute bony abnormality. IMPRESSION: 1. NG tube noted with tip in stomach. Residual contrast noted in the stomach. No significant volume of contrast noted in  the small bowel loops. 2. Persistent dilated loops of small bowel consistent small bowel obstruction. No free air. Electronically Signed   By: Marcello Moores  Register   On: 03/29/2016 09:34     Anti-infectives:   Anti-infectives    None      Alphonsa Overall, MD, FACS Pager: [phone] Surgery Office: [phone] 03/31/2016

## 2016-04-01 LAB — BASIC METABOLIC PANEL
Anion gap: 7 (ref 5–15)
BUN: 13 mg/dL (ref 6–20)
CHLORIDE: 109 mmol/L (ref 101–111)
CO2: 26 mmol/L (ref 22–32)
Calcium: 9.1 mg/dL (ref 8.9–10.3)
Creatinine, Ser: 1.09 mg/dL — ABNORMAL HIGH (ref 0.44–1.00)
GFR, EST NON AFRICAN AMERICAN: 60 mL/min — AB (ref >60)
Glucose, Bld: 122 mg/dL — ABNORMAL HIGH (ref 65–99)
POTASSIUM: 3.2 mmol/L — AB (ref 3.5–5.1)
SODIUM: 142 mmol/L (ref 135–145)

## 2016-04-01 LAB — GLUCOSE, CAPILLARY
GLUCOSE-CAPILLARY: 118 mg/dL — AB (ref 65–99)
GLUCOSE-CAPILLARY: 123 mg/dL — AB (ref 65–99)
GLUCOSE-CAPILLARY: 149 mg/dL — AB (ref 65–99)
Glucose-Capillary: 112 mg/dL — ABNORMAL HIGH (ref 65–99)

## 2016-04-01 MED ORDER — ALBUTEROL SULFATE HFA 108 (90 BASE) MCG/ACT IN AERS: 2.0000 | INHALATION_SPRAY | Freq: Four times a day (QID) | RESPIRATORY_TRACT | 0 refills | Status: DC | PRN

## 2016-04-01 MED ORDER — POTASSIUM CHLORIDE CRYS ER 20 MEQ PO TBCR
40.0000 meq | EXTENDED_RELEASE_TABLET | Freq: Once | ORAL | Status: AC
  Administered 2016-04-01: 40 meq via ORAL
  Filled 2016-04-01: qty 2

## 2016-04-01 MED ORDER — POTASSIUM CHLORIDE 20 MEQ PO PACK: 40.0000 meq | PACK | Freq: Once | ORAL | Status: DC

## 2016-04-01 MED ORDER — OXYCODONE-ACETAMINOPHEN 5-325 MG PO TABS
1.0000 | ORAL_TABLET | ORAL | Status: DC | PRN
  Administered 2016-04-01: 2 via ORAL
  Filled 2016-04-01: qty 2

## 2016-04-01 NOTE — Discharge Summary (Signed)
Physician Discharge Summary  Patricia [name] [id number] DOB: December 29, 1970 DOA: 03/28/2016  PCP: Carmie Kanner, NP  Admit date: 03/28/2016 Discharge date: 04/01/2016  Admitted From: Home Disposition:  Home  Recommendations for Outpatient Follow-up:  1. Follow up with PCP in 1-2 weeks   Home Health: No Equipment/Devices: No  Discharge Condition:Stable  CODE STATUS: Full  Diet recommendation: Heart Healthy / Carb Modified   Brief/Interim Summary: This is a 45 year old female who presented to the hospital with abdominal pain that did worsened progresively for last 3 days, the morning of admission the pain was more severe and associated with nausea and vomiting. She has had abdominal surgeries in the past and apparently bowel obstructions as well. On initial physical examination, she was ill-looking appearing, blood pressure is 162/117, heart rate is 80 respiratory rate of 20 years oximetry is 98% room air. Oral mucosa was dry, she did have an NG tube in place, her abdomen was distended, tympanic, tender to deep palpation, decreased bowel sounds. No peritoneal signs. Her sodium was 141, potassium 3.5, creatinine 0.99 with a glucose of 112 and a BUN of 8. White count 7.1, hemoglobin 13.1 hematocrit 40.8, platelet count 312. Urinalysis negative for infection. CT of the abdomen is showing a moderate grade small bowel obstruction.   Patient was admitted to hospital working diagnosis of small bowel obstruction, possibly related to adhesions.  1. Small bowel obstruction. Patient was treated conservatively, NG tube was placed to low intermittent suction. She received IV fluids, antiemetics and analgesics. She had radiographic surveillance with improvement of small bowel obstruction. Her diet was advanced with good toleration. Surgery was consulted since admission. By September 4 she was him to be stable to discharge home.  2. Hypertension. Patient was placed on IV agents initially then as po  toleration improved she was placed back on her clonidine and atenolol with improvement of her blood pressure. Systolic around 0000000. No signs of volume overload.  3. Asthma. Her asthma remained stable, she had oximetry monitoring as well as oxygen as needed. Albuterol inhaler was prescribed on discharge. She needs follow-up with her primary care physician within 7 days, she reports that her nebulizer machine is broken.  4. Hypokalemia. Patient developed hypokalemia during her hospital stay, potassium was repleted with potassium chloride intravenously and orally. Her discharge potassium 3.2 and she has received 40 mEq of potassium chloride before discharge.  5. Diabetes mellitus type 2. Patient was placed on insulin sliding-scale for glucose monitoring. Her serum glucose has remained around 118-149, continue lifestyle modifications and carbohydrate restricted diet at home.  6. Migraine headache. Patient developed a migraine attack while she was in the hospital, which improved with medical regimen including Imitrex.  7. Bipolar. She was continued on Seroquel, paroxetine and trazodone.  Discharge Diagnoses:  Principal Problem:   SBO (small bowel obstruction) (HCC) Active Problems:   Diabetes mellitus type 2 in obese ([place])   BIPOLAR DISORDER UNSPECIFIED   Anxiety state   Obstructive sleep apnea   Asthma   Morbid obesity ([place])   Small bowel obstruction [facility])    Discharge Instructions  Discharge Instructions    Diet - low sodium heart healthy    Complete by:  As directed   Discharge instructions    Complete by:  As directed   Please follow with primary care in 7 days.   Increase activity slowly    Complete by:  As directed        Medication List    STOP taking these medications  ibuprofen 800 MG tablet Commonly known as:  ADVIL,MOTRIN     TAKE these medications   albuterol 108 (90 Base) MCG/ACT inhaler Commonly known as:  PROVENTIL HFA;VENTOLIN HFA Inhale 2 puffs into the  lungs every 6 (six) hours as needed for shortness of breath.   albuterol (2.5 MG/3ML) 0.083% nebulizer solution Commonly known as:  PROVENTIL Take 2.5 mg by nebulization every 6 (six) hours as needed for shortness of breath.   atenolol 50 MG tablet Commonly known as:  TENORMIN Take 100 mg by mouth 2 (two) times daily.   beclomethasone 40 MCG/ACT inhaler Commonly known as:  QVAR Inhale 2 puffs into the lungs 2 (two) times daily.   Cetirizine HCl 10 MG Caps Commonly known as:  ZYRTEC ALLERGY Take 1 capsule (10 mg total) by mouth once. What changed:  when to take this   cloNIDine 0.3 MG tablet Commonly known as:  CATAPRES Take 0.3 mg by mouth 2 (two) times daily.   cyclobenzaprine 10 MG tablet Commonly known as:  FLEXERIL Take 10 mg by mouth 3 (three) times daily as needed for muscle spasms.   montelukast 10 MG tablet Commonly known as:  SINGULAIR Take 10 mg by mouth daily.   omeprazole 20 MG capsule Commonly known as:  PRILOSEC Take 40 mg by mouth every evening.   PARoxetine 20 MG tablet Commonly known as:  PAXIL Take 20 mg by mouth daily.   QUEtiapine 300 MG tablet Commonly known as:  SEROQUEL Take 600 mg by mouth at bedtime.   traZODone 100 MG tablet Commonly known as:  DESYREL Take 200 mg by mouth at bedtime.        Follow-up Information    PLACEY,MARY H, NP Follow up in 1 week(s).   Contact information: [place] [address] [phone]          Allergies  Allergen Reactions  . Peanut-Containing Drug Products Anaphylaxis    Peanut Oil Only.  Can eat peanuts with no problems    Consultations:  Surgery   Procedures/Studies: Ct Abdomen Pelvis W Contrast  Result Date: 03/28/2016 CLINICAL DATA:  Right lower quadrant pain for 2 days. EXAM: CT ABDOMEN AND PELVIS WITH CONTRAST TECHNIQUE: Multidetector CT imaging of the abdomen and pelvis was performed using the standard protocol following bolus administration of intravenous  contrast. CONTRAST:  135mL ISOVUE-300 IOPAMIDOL (ISOVUE-300) INJECTION 61% COMPARISON:  09/08/2014 FINDINGS: Lower chest:  No significant abnormality Hepatobiliary: There are normal appearances of the liver, gallbladder and bile ducts. Pancreas: Normal Spleen: Small unchanged focal hypodense lesion measuring approximately 2 cm. This is indeterminate but more likely benign. Otherwise normal spleen. Adrenals/Urinary Tract: The adrenals and kidneys are normal in appearance. There is no urinary calculus evident. There is no hydronephrosis or ureteral dilatation. Collecting systems and ureters appear unremarkable. Stomach/Bowel: There is abnormal dilatation of mid to distal small bowel with abrupt transition to decompressed distal ileum. The transition point is at a small bowel anastomosis in the anterior pelvic midline. Colon is unremarkable. Stomach is unremarkable. No extraluminal air. No ascites. Vascular/Lymphatic: The abdominal aorta is normal in caliber. There is no atherosclerotic calcification. There is no adenopathy in the abdomen or pelvis. Reproductive: Hysterectomy.  No adnexal abnormalities. Other: Musculoskeletal: No significant skeletal lesion. IMPRESSION: Abnormal dilatation of small bowel with abrupt caliber transition at the distal small bowel anastomosis, consistent with a moderate grade small bowel obstruction. No extraluminal air. Electronically Signed   By: Andreas Newport M.D.   On: 03/28/2016 06:40  Dg Abd 2 Views  Result Date: 03/30/2016 CLINICAL DATA:  Small bowel obstruction. EXAM: ABDOMEN - 2 VIEW COMPARISON:  03/29/2016 FINDINGS: Partial small bowel obstruction has essentially resolved with no significant dilated small bowel loops identified. There is contrast and air within the colon. Nasogastric tube extends into the proximal stomach. No free air. IMPRESSION: Essential resolution of partial small bowel obstruction by radiography. Electronically Signed   By: Aletta Edouard M.D.   On:  03/30/2016 09:29   Dg Abd 2 Views  Result Date: 03/29/2016 CLINICAL DATA:  Small-bowel obstruction.  Abdominal pain. EXAM: ABDOMEN - 2 VIEW COMPARISON:  03/28/2016. FINDINGS: NG tube noted in the stomach. Residual oral contrast in stomach. No definite contrast noted in bowel loops. Dilated loops of small bowel are again noted consistent with small bowel obstruction. No pneumatosis. No free air. No acute bony abnormality. IMPRESSION: 1. NG tube noted with tip in stomach. Residual contrast noted in the stomach. No significant volume of contrast noted in the small bowel loops. 2. Persistent dilated loops of small bowel consistent small bowel obstruction. No free air. Electronically Signed   By: Marcello Moores  Register   On: 03/29/2016 09:34   Dg Abd Portable 1v-small Bowel Obstruction Protocol-initial, 8 Hr Delay  Result Date: 03/28/2016 CLINICAL DATA:  Small bowel protocol.  8 hour delayed image. EXAM: PORTABLE ABDOMEN - 1 VIEW COMPARISON:  03/28/2016 at 7:58 a.m. FINDINGS: Contrast injected through the nasogastric tube poles within the gastric fundus. No contrast is seen within the small bowel. IMPRESSION: Findings support a high-grade small bowel obstruction. Contrast remains in the stomach. Electronically Signed   By: Lajean Manes M.D.   On: 03/28/2016 17:01   Dg Abd Portable 1v-small Bowel Protocol-position Verification  Result Date: 03/28/2016 CLINICAL DATA:  NG tube placement for small bowel protocol EXAM: PORTABLE ABDOMEN - 1 VIEW COMPARISON:  CT scan of the abdomen and pelvis of today's date FINDINGS: The esophagogastric tube tip projects in the region of the gastric body but the proximal port is at or just below the GE junction. There remain a few mildly distended small bowel loops. The stool burden in the colon is moderate. There is contrast within the renal collecting systems bilaterally. IMPRESSION: Advancement of the nasogastric tube by approximately 10 cm is recommended to assure that the proximal  port remains below the GE junction. Electronically Signed   By: David  Martinique M.D.   On: 03/28/2016 08:14       Subjective: Patient is feeling much better no nausea no vomiting tolerating by mouth diet adequately, positive flatus.  Discharge Exam: Vitals:   03/31/16 2041 04/01/16 0609  BP: 139/83 116/69  Pulse: (!) 58 60  Resp: 19 17  Temp: 98.1 F (36.7 C) 97.7 F (36.5 C)   Vitals:   03/31/16 1632 03/31/16 2041 03/31/16 2128 04/01/16 0609  BP: (!) 159/94 139/83  116/69  Pulse: 71 (!) 58  60  Resp: 18 19  17   Temp: 98.7 F (37.1 C) 98.1 F (36.7 C)  97.7 F (36.5 C)  TempSrc: Oral Oral  Oral  SpO2: 97% 100% 98% 100%  Weight:      Height:        General: Pt is alert, awake, not in acute distress Cardiovascular: RRR, S1/S2 +, no rubs, no gallops Respiratory: CTA bilaterally, no wheezing, no rhonchi Abdominal: Soft, NT, ND, bowel sounds +, Mild tenderness on right lower quadrant Extremities: no edema, no cyanosis    The results of significant diagnostics from this  hospitalization (including imaging, microbiology, ancillary and laboratory) are listed below for reference.     Microbiology: No results found for this or any previous visit (from the past 240 hour(s)).   Labs: BNP (last 3 results) No results for input(s): BNP in the last 8760 hours. Basic Metabolic Panel:  Recent Labs Lab 03/28/16 0439 03/29/16 0526 03/30/16 0538 03/31/16 0550 04/01/16 0541  NA 141 137 138 141 142  K 3.5 3.4* 3.8 3.0* 3.2*  CL 109 106 107 106 109  CO2 27 24 24 26 26   GLUCOSE 112* 134* 138* 110* 122*  BUN 8 7 5* 7 13  CREATININE 0.99 0.86 0.73 0.90 1.09*  CALCIUM 9.4 9.1 9.3 9.2 9.1   Liver Function Tests:  Recent Labs Lab 03/28/16 0439 03/29/16 0526  AST 15 14*  ALT 13* 14  ALKPHOS 57 48  BILITOT 0.4 0.6  PROT 8.0 7.3  ALBUMIN 4.2 3.9    Recent Labs Lab 03/28/16 0439  LIPASE 23   No results for input(s): AMMONIA in the last 168 hours. CBC:  Recent  Labs Lab 03/28/16 0439 03/29/16 0526 03/30/16 0538 03/31/16 0550  WBC 7.1 8.1 8.9 6.6  NEUTROABS 4.2  --  7.8* 3.7  HGB 13.1 12.6 12.5 12.2  HCT 40.8 39.5 38.9 38.0  MCV 82.8 81.6 81.9 81.9  PLT 312 349 313 294   Cardiac Enzymes: No results for input(s): CKTOTAL, CKMB, CKMBINDEX, TROPONINI in the last 168 hours. BNP: Invalid input(s): POCBNP CBG:  Recent Labs Lab 03/31/16 2117 04/01/16 0048 04/01/16 0532 04/01/16 0744 04/01/16 1207  GLUCAP 99 149* 112* 118* 123*   D-Dimer No results for input(s): DDIMER in the last 72 hours. Hgb A1c No results for input(s): HGBA1C in the last 72 hours. Lipid Profile No results for input(s): CHOL, HDL, LDLCALC, TRIG, CHOLHDL, LDLDIRECT in the last 72 hours. Thyroid function studies No results for input(s): TSH, T4TOTAL, T3FREE, THYROIDAB in the last 72 hours.  Invalid input(s): FREET3 Anemia work up No results for input(s): VITAMINB12, FOLATE, FERRITIN, TIBC, IRON, RETICCTPCT in the last 72 hours. Urinalysis    Component Value Date/Time   COLORURINE YELLOW 03/28/2016 0633   APPEARANCEUR CLEAR 03/28/2016 0633   LABSPEC 1.034 (H) 03/28/2016 0633   PHURINE 5.5 03/28/2016 0633   GLUCOSEU NEGATIVE 03/28/2016 0633   HGBUR NEGATIVE 03/28/2016 0633   BILIRUBINUR NEGATIVE 03/28/2016 0633   KETONESUR NEGATIVE 03/28/2016 0633   PROTEINUR NEGATIVE 03/28/2016 0633   UROBILINOGEN 0.2 03/30/2015 0040   NITRITE NEGATIVE 03/28/2016 0633   LEUKOCYTESUR NEGATIVE 03/28/2016 [id number]   Sepsis Labs Invalid input(s): PROCALCITONIN,  WBC,  LACTICIDVEN Microbiology No results found for this or any previous visit (from the past 240 hour(s)).   Time coordinating discharge: 45 minutes  SIGNED:   Tawni Millers, MD  [facility] 04/01/2016, 2:17 PM Pager   If 7PM-7AM, please contact night-coverage [email or web address] Password [email or web address]

## 2016-04-01 NOTE — Progress Notes (Signed)
Patient ID: Patricia [name], female   DOB: 1970/09/10, 45 y.o.   MRN: [id number]   [facility] Office:  [phone] General Surgery Progress Note   LOS: 4 days  POD -     Assessment/Plan: 1.  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 2.  DM  Glucose - 110 - 03/31/2016 3.  Bipolar disorder 4.  OSA 5.  Morbid obesity 6.  DVT prophylaxis - Lovenox 7.  Hypokalemia -   K+ - 3.0 - 03/31/2016   Principal Problem:   SBO (small bowel obstruction) (HCC) Active Problems:   Diabetes mellitus type 2 in obese ([place])   BIPOLAR DISORDER UNSPECIFIED   Anxiety state   Obstructive sleep apnea   Asthma   Morbid obesity ([place])   Small bowel obstruction (HCC)   Subjective:  No n/v.  Tolerated reg diet.  Having flatus, had BM.  Does still have some pain on right side.    Objective:   Vitals:   03/31/16 2041 04/01/16 0609  BP: 139/83 116/69  Pulse: (!) 58 60  Resp: 19 17  Temp: 98.1 F (36.7 C) 97.7 F (36.5 C)     Intake/Output from previous day:  09/03 0701 - 09/04 0700 In: -  Out: 950 [Urine:950]  Intake/Output this shift:  Total I/O In: 236 [P.O.:236] Out: -    Physical Exam:   General: Obese AA F who is alert and oriented.    HEENT: Normal. Pupils equal. .   Lungs: breathing comfortably   Abdomen: Some soreness in RLQ.   Lab Results:     Recent Labs  03/30/16 0538 03/31/16 0550  WBC 8.9 6.6  HGB 12.5 12.2  HCT 38.9 38.0  PLT 313 294    BMET    Recent Labs  03/31/16 0550 04/01/16 0541  NA 141 142  K 3.0* 3.2*  CL 106 109  CO2 26 26  GLUCOSE 110* 122*  BUN 7 13  CREATININE 0.90 1.09*  CALCIUM 9.2 9.1    PT/INR  No results for input(s): LABPROT, INR in the last 72 hours.  ABG  No results for input(s): PHART, HCO3 in the last 72 hours.  Invalid input(s): PCO2, PO2   Studies/Results:  No results  found.   Anti-infectives:   [place] [facility] Office: [phone] 04/01/2016

## 2016-04-01 NOTE — Progress Notes (Signed)
Date: April 01, 2016 Discharge orders checked for needs. No needs present at time of discharge. Velva Harman, RN, BSN, [place]   [phone]

## 2016-04-01 NOTE — Progress Notes (Signed)
Discharge instructions given to pt, verbalized understanding. Left the unit in stable condition. 

## 2016-04-07 ENCOUNTER — Encounter (HOSPITAL_COMMUNITY): Payer: Self-pay | Admitting: Emergency Medicine

## 2016-04-07 ENCOUNTER — Emergency Department (HOSPITAL_COMMUNITY)
Admission: EM | Admit: 2016-04-07 | Discharge: 2016-04-07 | Disposition: A | Discharge status: Home / Self Care | Payer: Self-pay | Attending: Emergency Medicine | Admitting: Emergency Medicine

## 2016-04-07 DIAGNOSIS — Y999 Unspecified external cause status: Secondary | ICD-10-CM | POA: Insufficient documentation

## 2016-04-07 DIAGNOSIS — Z87891 Personal history of nicotine dependence: Secondary | ICD-10-CM | POA: Insufficient documentation

## 2016-04-07 DIAGNOSIS — I10 Essential (primary) hypertension: Secondary | ICD-10-CM | POA: Insufficient documentation

## 2016-04-07 DIAGNOSIS — R112 Nausea with vomiting, unspecified: Secondary | ICD-10-CM | POA: Insufficient documentation

## 2016-04-07 DIAGNOSIS — R51 Headache: Secondary | ICD-10-CM | POA: Insufficient documentation

## 2016-04-07 DIAGNOSIS — R519 Headache, unspecified: Secondary | ICD-10-CM

## 2016-04-07 DIAGNOSIS — Z79899 Other long term (current) drug therapy: Secondary | ICD-10-CM | POA: Insufficient documentation

## 2016-04-07 DIAGNOSIS — F1290 Cannabis use, unspecified, uncomplicated: Secondary | ICD-10-CM | POA: Insufficient documentation

## 2016-04-07 DIAGNOSIS — E119 Type 2 diabetes mellitus without complications: Secondary | ICD-10-CM | POA: Insufficient documentation

## 2016-04-07 DIAGNOSIS — Y929 Unspecified place or not applicable: Secondary | ICD-10-CM | POA: Insufficient documentation

## 2016-04-07 DIAGNOSIS — Y9389 Activity, other specified: Secondary | ICD-10-CM | POA: Insufficient documentation

## 2016-04-07 DIAGNOSIS — J45909 Unspecified asthma, uncomplicated: Secondary | ICD-10-CM | POA: Insufficient documentation

## 2016-04-07 MED ORDER — KETOROLAC TROMETHAMINE 30 MG/ML IJ SOLN
30.0000 mg | Freq: Once | INTRAMUSCULAR | Status: AC
  Administered 2016-04-07: 30 mg via INTRAVENOUS
  Filled 2016-04-07: qty 1

## 2016-04-07 MED ORDER — PROCHLORPERAZINE EDISYLATE 5 MG/ML IJ SOLN
10.0000 mg | Freq: Once | INTRAMUSCULAR | Status: AC
  Administered 2016-04-07: 10 mg via INTRAVENOUS
  Filled 2016-04-07: qty 2

## 2016-04-07 MED ORDER — DIPHENHYDRAMINE HCL 50 MG/ML IJ SOLN
12.5000 mg | Freq: Once | INTRAMUSCULAR | Status: AC
  Administered 2016-04-07: 12.5 mg via INTRAVENOUS
  Filled 2016-04-07: qty 1
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
## 2016-04-25 ENCOUNTER — Encounter: Payer: Self-pay | Admitting: Emergency Medicine

## 2016-04-25 ENCOUNTER — Emergency Department
Admission: EM | Admit: 2016-04-25 | Discharge: 2016-04-25 | Disposition: A | Discharge status: Home / Self Care | Payer: Self-pay | Attending: Emergency Medicine | Admitting: Emergency Medicine

## 2016-04-25 DIAGNOSIS — R51 Headache: Secondary | ICD-10-CM | POA: Insufficient documentation

## 2016-04-25 DIAGNOSIS — Z79899 Other long term (current) drug therapy: Secondary | ICD-10-CM | POA: Insufficient documentation

## 2016-04-25 DIAGNOSIS — F1290 Cannabis use, unspecified, uncomplicated: Secondary | ICD-10-CM | POA: Insufficient documentation

## 2016-04-25 DIAGNOSIS — N9489 Other specified conditions associated with female genital organs and menstrual cycle: Secondary | ICD-10-CM | POA: Insufficient documentation

## 2016-04-25 DIAGNOSIS — E119 Type 2 diabetes mellitus without complications: Secondary | ICD-10-CM | POA: Insufficient documentation

## 2016-04-25 DIAGNOSIS — R519 Headache, unspecified: Secondary | ICD-10-CM

## 2016-04-25 DIAGNOSIS — I10 Essential (primary) hypertension: Secondary | ICD-10-CM | POA: Insufficient documentation

## 2016-04-25 DIAGNOSIS — J45909 Unspecified asthma, uncomplicated: Secondary | ICD-10-CM | POA: Insufficient documentation

## 2016-04-25 DIAGNOSIS — Z87891 Personal history of nicotine dependence: Secondary | ICD-10-CM | POA: Insufficient documentation

## 2016-04-25 DIAGNOSIS — Z91010 Allergy to peanuts: Secondary | ICD-10-CM | POA: Insufficient documentation

## 2016-04-25 DIAGNOSIS — R42 Dizziness and giddiness: Secondary | ICD-10-CM | POA: Insufficient documentation

## 2016-04-25 LAB — COMPREHENSIVE METABOLIC PANEL
ALT: 17 U/L (ref 14–54)
AST: 26 U/L (ref 15–41)
Albumin: 4 g/dL (ref 3.5–5.0)
Alkaline Phosphatase: 60 U/L (ref 38–126)
Anion gap: 8 (ref 5–15)
BILIRUBIN TOTAL: 0.9 mg/dL (ref 0.3–1.2)
BUN: 11 mg/dL (ref 6–20)
CHLORIDE: 107 mmol/L (ref 101–111)
CO2: 23 mmol/L (ref 22–32)
CREATININE: 1 mg/dL (ref 0.44–1.00)
Calcium: 9 mg/dL (ref 8.9–10.3)
GFR calc Af Amer: >60 mL/min (ref >60)
Glucose, Bld: 115 mg/dL — ABNORMAL HIGH (ref 65–99)
Potassium: 4.7 mmol/L (ref 3.5–5.1)
Sodium: 138 mmol/L (ref 135–145)
Total Protein: 7.5 g/dL (ref 6.5–8.1)

## 2016-04-25 LAB — CBC WITH DIFFERENTIAL/PLATELET
BASOS ABS: 0 10*3/uL (ref 0–0.1)
Basophils Relative: 1 %
Eosinophils Absolute: 0.1 10*3/uL (ref 0–0.7)
Eosinophils Relative: 3 %
HEMATOCRIT: 36.4 % (ref 35.0–47.0)
Hemoglobin: 12.1 g/dL (ref 12.0–16.0)
Lymphocytes Relative: 41 %
Lymphs Abs: 2.4 10*3/uL (ref 1.0–3.6)
MCH: 26.9 pg (ref 26.0–34.0)
MCHC: 33.4 g/dL (ref 32.0–36.0)
MCV: 80.6 fL (ref 80.0–100.0)
Monocytes Absolute: 0.4 10*3/uL (ref 0.2–0.9)
Monocytes Relative: 7 %
Neutro Abs: 2.8 10*3/uL (ref 1.4–6.5)
Neutrophils Relative %: 48 %
PLATELETS: 302 10*3/uL (ref 150–440)
RBC: 4.51 MIL/uL (ref 3.80–5.20)
RDW: 15 % — ABNORMAL HIGH (ref 11.5–14.5)
WBC: 5.8 10*3/uL (ref 3.6–11.0)

## 2016-04-25 LAB — HCG, QUANTITATIVE, PREGNANCY: HCG, BETA CHAIN, QUANT, S: 2 m[IU]/mL (ref <5)

## 2016-04-25 LAB — TROPONIN I: Troponin I: <0.03 ng/mL (ref <0.03)

## 2016-04-25 MED ORDER — KETOROLAC TROMETHAMINE 30 MG/ML IJ SOLN
30.0000 mg | Freq: Once | INTRAMUSCULAR | Status: AC
  Administered 2016-04-25: 30 mg via INTRAVENOUS
  Filled 2016-04-25: qty 1

## 2016-04-25 MED ORDER — METOCLOPRAMIDE HCL 5 MG/ML IJ SOLN
10.0000 mg | Freq: Once | INTRAMUSCULAR | Status: AC
  Administered 2016-04-25: 10 mg via INTRAVENOUS
  Filled 2016-04-25: qty 2

## 2016-04-25 MED ORDER — SODIUM CHLORIDE 0.9 % IV SOLN
1000.0000 mL | Freq: Once | INTRAVENOUS | Status: AC
  Administered 2016-04-25: 1000 mL via INTRAVENOUS

## 2016-04-25 MED ORDER — DIPHENHYDRAMINE HCL 50 MG/ML IJ SOLN
25.0000 mg | Freq: Once | INTRAMUSCULAR | Status: AC
  Administered 2016-04-25: 25 mg via INTRAVENOUS
  Filled 2016-04-25: qty 1
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
## 2016-04-25 NOTE — ED Triage Notes (Signed)
Per [facility], patient comes from home with c/o HTN, dizziness, HA and nausea. Patient states she is compliant with her BP medication. Patient going through menopause. A&O x4. MD at bedside. BP 159/105.

## 2016-07-07 ENCOUNTER — Encounter: Payer: Self-pay | Admitting: Emergency Medicine

## 2016-07-07 ENCOUNTER — Emergency Department: Payer: Self-pay

## 2016-07-07 ENCOUNTER — Other Ambulatory Visit: Payer: Self-pay

## 2016-07-07 DIAGNOSIS — R0789 Other chest pain: Secondary | ICD-10-CM | POA: Insufficient documentation

## 2016-07-07 DIAGNOSIS — Z87891 Personal history of nicotine dependence: Secondary | ICD-10-CM | POA: Insufficient documentation

## 2016-07-07 DIAGNOSIS — Z79899 Other long term (current) drug therapy: Secondary | ICD-10-CM | POA: Insufficient documentation

## 2016-07-07 DIAGNOSIS — E119 Type 2 diabetes mellitus without complications: Secondary | ICD-10-CM | POA: Insufficient documentation

## 2016-07-07 DIAGNOSIS — Y999 Unspecified external cause status: Secondary | ICD-10-CM | POA: Insufficient documentation

## 2016-07-07 DIAGNOSIS — Y939 Activity, unspecified: Secondary | ICD-10-CM | POA: Insufficient documentation

## 2016-07-07 DIAGNOSIS — Y929 Unspecified place or not applicable: Secondary | ICD-10-CM | POA: Insufficient documentation

## 2016-07-07 DIAGNOSIS — J45909 Unspecified asthma, uncomplicated: Secondary | ICD-10-CM | POA: Insufficient documentation

## 2016-07-07 DIAGNOSIS — S0083XA Contusion of other part of head, initial encounter: Secondary | ICD-10-CM | POA: Insufficient documentation

## 2016-07-07 DIAGNOSIS — I10 Essential (primary) hypertension: Secondary | ICD-10-CM | POA: Insufficient documentation

## 2016-07-07 DIAGNOSIS — M542 Cervicalgia: Secondary | ICD-10-CM | POA: Insufficient documentation

## 2016-07-07 LAB — CBC WITH DIFFERENTIAL/PLATELET
BASOS PCT: 1 %
Basophils Absolute: 0.1 10*3/uL (ref 0–0.1)
Eosinophils Absolute: 0.1 10*3/uL (ref 0–0.7)
Eosinophils Relative: 1 %
HEMATOCRIT: 37 % (ref 35.0–47.0)
Hemoglobin: 12.2 g/dL (ref 12.0–16.0)
Lymphocytes Relative: 17 %
Lymphs Abs: 2.1 10*3/uL (ref 1.0–3.6)
MCH: 26.8 pg (ref 26.0–34.0)
MCHC: 33.1 g/dL (ref 32.0–36.0)
MCV: 80.9 fL (ref 80.0–100.0)
MONO ABS: 0.7 10*3/uL (ref 0.2–0.9)
MONOS PCT: 6 %
NEUTROS ABS: 9.5 10*3/uL — AB (ref 1.4–6.5)
Neutrophils Relative %: 77 %
Platelets: 372 10*3/uL (ref 150–440)
RBC: 4.57 MIL/uL (ref 3.80–5.20)
RDW: 14.3 % (ref 11.5–14.5)
WBC: 12.4 10*3/uL — ABNORMAL HIGH (ref 3.6–11.0)

## 2016-07-07 LAB — COMPREHENSIVE METABOLIC PANEL
ALBUMIN: 4.3 g/dL (ref 3.5–5.0)
ALT: 28 U/L (ref 14–54)
ANION GAP: 8 (ref 5–15)
AST: 29 U/L (ref 15–41)
Alkaline Phosphatase: 65 U/L (ref 38–126)
BILIRUBIN TOTAL: 0.6 mg/dL (ref 0.3–1.2)
BUN: 16 mg/dL (ref 6–20)
CO2: 23 mmol/L (ref 22–32)
Calcium: 9.3 mg/dL (ref 8.9–10.3)
Chloride: 109 mmol/L (ref 101–111)
Creatinine, Ser: 0.8 mg/dL (ref 0.44–1.00)
GLUCOSE: 137 mg/dL — AB (ref 65–99)
POTASSIUM: 3.6 mmol/L (ref 3.5–5.1)
Sodium: 140 mmol/L (ref 135–145)
TOTAL PROTEIN: 8.3 g/dL — AB (ref 6.5–8.1)

## 2016-07-07 LAB — TROPONIN I: Troponin I: <0.03 ng/mL (ref <0.03)

## 2016-07-07 MED ORDER — IOPAMIDOL (ISOVUE-300) INJECTION 61%
75.0000 mL | Freq: Once | INTRAVENOUS | Status: AC | PRN
  Administered 2016-07-07: 75 mL via INTRAVENOUS

## 2016-07-07 NOTE — ED Triage Notes (Signed)
Pt with left periorbital swelling, eyebrow swelling.

## 2016-07-07 NOTE — ED Notes (Signed)
Pt now complains of chest pain.
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
## 2016-07-08 ENCOUNTER — Emergency Department: Payer: Self-pay

## 2016-07-08 ENCOUNTER — Emergency Department
Admission: EM | Admit: 2016-07-08 | Discharge: 2016-07-08 | Disposition: A | Discharge status: Home / Self Care | Payer: Self-pay | Attending: Emergency Medicine | Admitting: Emergency Medicine

## 2016-07-08 DIAGNOSIS — T07XXXA Unspecified multiple injuries, initial encounter: Secondary | ICD-10-CM

## 2016-07-08 MED ORDER — ONDANSETRON 4 MG PO TBDP: 4.0000 mg | ORAL_TABLET | Freq: Three times a day (TID) | ORAL | 0 refills | Status: DC | PRN

## 2016-07-08 MED ORDER — OXYCODONE-ACETAMINOPHEN 5-325 MG PO TABS
2.0000 | ORAL_TABLET | Freq: Once | ORAL | Status: AC
  Administered 2016-07-08: 2 via ORAL

## 2016-07-08 MED ORDER — ONDANSETRON 4 MG PO TBDP
4.0000 mg | ORAL_TABLET | Freq: Once | ORAL | Status: AC
  Administered 2016-07-08: 4 mg via ORAL

## 2016-07-08 MED ORDER — ONDANSETRON 4 MG PO TBDP
ORAL_TABLET | ORAL | Status: AC
  Filled 2016-07-08: qty 1

## 2016-07-08 MED ORDER — OXYCODONE-ACETAMINOPHEN 5-325 MG PO TABS: 1.0000 | ORAL_TABLET | Freq: Four times a day (QID) | ORAL | 0 refills | Status: DC | PRN

## 2016-07-08 MED ORDER — OXYCODONE-ACETAMINOPHEN 5-325 MG PO TABS
ORAL_TABLET | ORAL | Status: AC
  Filled 2016-07-08: qty 2

## 2016-07-08 MED ORDER — PROMETHAZINE HCL 25 MG PO TABS
25.0000 mg | ORAL_TABLET | Freq: Once | ORAL | Status: AC
  Administered 2016-07-08: 25 mg via ORAL

## 2016-07-08 MED ORDER — PROMETHAZINE HCL 25 MG PO TABS
ORAL_TABLET | ORAL | Status: AC
  Filled 2016-07-08: qty 1

## 2016-07-08 MED ORDER — MORPHINE SULFATE (PF) 4 MG/ML IV SOLN
4.0000 mg | Freq: Once | INTRAVENOUS | Status: AC
  Administered 2016-07-08: 4 mg via INTRAVENOUS
  Filled 2016-07-08: qty 1

## 2016-07-08 MED FILL — ONDANSETRON ODT 4 MG TABLET: 4 | 7 days supply | Qty: 20 | Fill #0

## 2016-07-08 NOTE — ED Notes (Signed)
fne exam complete.
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
## 2016-07-08 NOTE — ED Notes (Signed)
Pt continues to complain of nausea. Order for phenergan received.
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
## 2016-07-08 NOTE — ED Notes (Signed)
SANE RN Lenna Sciara notified and is on way to see pt.
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
## 2016-07-08 NOTE — ED Notes (Signed)
Sane rn in to examine pt.

## 2016-07-08 NOTE — ED Notes (Signed)
Before morphine administred pt states "that morphine doesn't work for me they always have to give me milligrams of dilaudid". Immediately after morphine administered pt states "whoa I feel that, damn it's working". Pt to xray approx 4 minutes after morphine administration.
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
## 2017-03-16 ENCOUNTER — Emergency Department
Admission: EM | Admit: 2017-03-16 | Discharge: 2017-03-16 | Disposition: A | Discharge status: Home / Self Care | Payer: Self-pay | Attending: Emergency Medicine | Admitting: Emergency Medicine

## 2017-03-16 ENCOUNTER — Encounter: Payer: Self-pay | Admitting: Emergency Medicine

## 2017-03-16 ENCOUNTER — Emergency Department: Payer: Self-pay

## 2017-03-16 DIAGNOSIS — K5901 Slow transit constipation: Secondary | ICD-10-CM | POA: Insufficient documentation

## 2017-03-16 DIAGNOSIS — E119 Type 2 diabetes mellitus without complications: Secondary | ICD-10-CM | POA: Insufficient documentation

## 2017-03-16 DIAGNOSIS — I10 Essential (primary) hypertension: Secondary | ICD-10-CM | POA: Insufficient documentation

## 2017-03-16 DIAGNOSIS — J45909 Unspecified asthma, uncomplicated: Secondary | ICD-10-CM | POA: Insufficient documentation

## 2017-03-16 DIAGNOSIS — G43809 Other migraine, not intractable, without status migrainosus: Secondary | ICD-10-CM | POA: Insufficient documentation

## 2017-03-16 DIAGNOSIS — J449 Chronic obstructive pulmonary disease, unspecified: Secondary | ICD-10-CM | POA: Insufficient documentation

## 2017-03-16 DIAGNOSIS — Z87891 Personal history of nicotine dependence: Secondary | ICD-10-CM | POA: Insufficient documentation

## 2017-03-16 DIAGNOSIS — Z79899 Other long term (current) drug therapy: Secondary | ICD-10-CM | POA: Insufficient documentation

## 2017-03-16 LAB — COMPREHENSIVE METABOLIC PANEL
ALBUMIN: 3.8 g/dL (ref 3.5–5.0)
ALT: 15 U/L (ref 14–54)
AST: 16 U/L (ref 15–41)
Alkaline Phosphatase: 53 U/L (ref 38–126)
Anion gap: 6 (ref 5–15)
BILIRUBIN TOTAL: 0.4 mg/dL (ref 0.3–1.2)
BUN: 13 mg/dL (ref 6–20)
CO2: 25 mmol/L (ref 22–32)
CREATININE: 1.02 mg/dL — AB (ref 0.44–1.00)
Calcium: 8.8 mg/dL — ABNORMAL LOW (ref 8.9–10.3)
Chloride: 110 mmol/L (ref 101–111)
GFR calc Af Amer: >60 mL/min (ref >60)
GFR calc non Af Amer: >60 mL/min (ref >60)
GLUCOSE: 106 mg/dL — AB (ref 65–99)
POTASSIUM: 3.5 mmol/L (ref 3.5–5.1)
Sodium: 141 mmol/L (ref 135–145)
TOTAL PROTEIN: 7 g/dL (ref 6.5–8.1)

## 2017-03-16 LAB — CBC
HEMATOCRIT: 36.9 % (ref 35.0–47.0)
Hemoglobin: 12.1 g/dL (ref 12.0–16.0)
MCH: 25.9 pg — ABNORMAL LOW (ref 26.0–34.0)
MCHC: 32.7 g/dL (ref 32.0–36.0)
MCV: 79.4 fL — AB (ref 80.0–100.0)
PLATELETS: 295 10*3/uL (ref 150–440)
RBC: 4.65 MIL/uL (ref 3.80–5.20)
RDW: 14.2 % (ref 11.5–14.5)
WBC: 5.2 10*3/uL (ref 3.6–11.0)

## 2017-03-16 LAB — LIPASE, BLOOD: Lipase: 39 U/L (ref 11–51)

## 2017-03-16 MED ORDER — BUTALBITAL-APAP-CAFFEINE 50-325-40 MG PO TABS: 1.0000 | ORAL_TABLET | Freq: Four times a day (QID) | ORAL | 0 refills | Status: DC | PRN

## 2017-03-16 MED ORDER — BUTALBITAL-APAP-CAFFEINE 50-325-40 MG PO TABS
1.0000 | ORAL_TABLET | Freq: Once | ORAL | Status: AC
  Administered 2017-03-16: 1 via ORAL
  Filled 2017-03-16: qty 1

## 2017-03-16 MED ORDER — KETOROLAC TROMETHAMINE 30 MG/ML IJ SOLN
30.0000 mg | Freq: Once | INTRAMUSCULAR | Status: AC
  Administered 2017-03-16: 30 mg via INTRAMUSCULAR
  Filled 2017-03-16: qty 1

## 2017-03-16 NOTE — ED Triage Notes (Signed)
Pt states that her blood pressure was been elevated at home, pt is also c/o headache and states that she is having abd pain. Pt states that she has hx/o bowel obstructions and that she has not been able to have a bowel movement since Sunday or Monday. Pt VSS, and pt does not appear to be in any distress at this time.
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
## 2017-04-09 ENCOUNTER — Emergency Department
Admission: EM | Admit: 2017-04-09 | Discharge: 2017-04-09 | Disposition: A | Discharge status: Home / Self Care | Payer: Self-pay | Attending: Emergency Medicine | Admitting: Emergency Medicine

## 2017-04-09 ENCOUNTER — Encounter: Payer: Self-pay | Admitting: Medical Oncology

## 2017-04-09 DIAGNOSIS — Z87891 Personal history of nicotine dependence: Secondary | ICD-10-CM | POA: Insufficient documentation

## 2017-04-09 DIAGNOSIS — E119 Type 2 diabetes mellitus without complications: Secondary | ICD-10-CM | POA: Insufficient documentation

## 2017-04-09 DIAGNOSIS — E0800 Diabetes mellitus due to underlying condition with hyperosmolarity without nonketotic hyperglycemic-hyperosmolar coma (NKHHC): Secondary | ICD-10-CM

## 2017-04-09 DIAGNOSIS — L298 Other pruritus: Secondary | ICD-10-CM | POA: Insufficient documentation

## 2017-04-09 DIAGNOSIS — J45909 Unspecified asthma, uncomplicated: Secondary | ICD-10-CM | POA: Insufficient documentation

## 2017-04-09 DIAGNOSIS — Z79899 Other long term (current) drug therapy: Secondary | ICD-10-CM | POA: Insufficient documentation

## 2017-04-09 DIAGNOSIS — R631 Polydipsia: Secondary | ICD-10-CM | POA: Insufficient documentation

## 2017-04-09 DIAGNOSIS — I10 Essential (primary) hypertension: Secondary | ICD-10-CM | POA: Insufficient documentation

## 2017-04-09 DIAGNOSIS — L299 Pruritus, unspecified: Secondary | ICD-10-CM

## 2017-04-09 DIAGNOSIS — R350 Frequency of micturition: Secondary | ICD-10-CM | POA: Insufficient documentation

## 2017-04-09 DIAGNOSIS — Z9119 Patient's noncompliance with other medical treatment and regimen: Secondary | ICD-10-CM | POA: Insufficient documentation

## 2017-04-09 DIAGNOSIS — Z91010 Allergy to peanuts: Secondary | ICD-10-CM | POA: Insufficient documentation

## 2017-04-09 LAB — URINALYSIS, COMPLETE (UACMP) WITH MICROSCOPIC
Bacteria, UA: NONE SEEN
Bilirubin Urine: NEGATIVE
Glucose, UA: 50 mg/dL — AB
Hgb urine dipstick: NEGATIVE
Ketones, ur: NEGATIVE mg/dL
LEUKOCYTES UA: NEGATIVE
Nitrite: NEGATIVE
Protein, ur: NEGATIVE mg/dL
RBC / HPF: NONE SEEN RBC/hpf (ref 0–5)
SPECIFIC GRAVITY, URINE: 1.015 (ref 1.005–1.030)
pH: 7 (ref 5.0–8.0)

## 2017-04-09 LAB — BASIC METABOLIC PANEL
Anion gap: 7 (ref 5–15)
BUN: 11 mg/dL (ref 6–20)
CALCIUM: 9 mg/dL (ref 8.9–10.3)
CHLORIDE: 106 mmol/L (ref 101–111)
CO2: 25 mmol/L (ref 22–32)
Creatinine, Ser: 0.98 mg/dL (ref 0.44–1.00)
GFR calc non Af Amer: >60 mL/min (ref >60)
Glucose, Bld: 199 mg/dL — ABNORMAL HIGH (ref 65–99)
Potassium: 4 mmol/L (ref 3.5–5.1)
SODIUM: 138 mmol/L (ref 135–145)

## 2017-04-09 LAB — CBC WITH DIFFERENTIAL/PLATELET
BASOS PCT: 1 %
Basophils Absolute: 0 10*3/uL (ref 0–0.1)
Eosinophils Absolute: 0.2 10*3/uL (ref 0–0.7)
Eosinophils Relative: 4 %
HCT: 35 % (ref 35.0–47.0)
HEMOGLOBIN: 11.7 g/dL — AB (ref 12.0–16.0)
LYMPHS ABS: 1.7 10*3/uL (ref 1.0–3.6)
Lymphocytes Relative: 33 %
MCH: 26.2 pg (ref 26.0–34.0)
MCHC: 33.4 g/dL (ref 32.0–36.0)
MCV: 78.5 fL — ABNORMAL LOW (ref 80.0–100.0)
Monocytes Absolute: 0.3 10*3/uL (ref 0.2–0.9)
Monocytes Relative: 5 %
NEUTROS PCT: 57 %
Neutro Abs: 3 10*3/uL (ref 1.4–6.5)
Platelets: 281 10*3/uL (ref 150–440)
RBC: 4.46 MIL/uL (ref 3.80–5.20)
RDW: 14.1 % (ref 11.5–14.5)
WBC: 5.2 10*3/uL (ref 3.6–11.0)

## 2017-04-09 MED ORDER — HYDROXYZINE HCL 50 MG PO TABS: 50.0000 mg | ORAL_TABLET | Freq: Three times a day (TID) | ORAL | 0 refills | Status: DC | PRN

## 2017-04-09 MED ORDER — ONDANSETRON HCL 4 MG/2ML IJ SOLN
4.0000 mg | Freq: Once | INTRAMUSCULAR | Status: AC
  Administered 2017-04-09: 4 mg via INTRAVENOUS

## 2017-04-09 MED ORDER — METFORMIN HCL 500 MG PO TABS: 500.0000 mg | ORAL_TABLET | Freq: Two times a day (BID) | ORAL | 0 refills | Status: DC

## 2017-04-09 MED ORDER — ONDANSETRON HCL 4 MG/2ML IJ SOLN
INTRAMUSCULAR | Status: AC
Start: 2017-04-09 — End: 2017-04-09
  Administered 2017-04-09: 4 mg via INTRAVENOUS
  Filled 2017-04-09: qty 2

## 2017-04-09 MED ORDER — DIPHENHYDRAMINE HCL 50 MG/ML IJ SOLN
25.0000 mg | Freq: Once | INTRAMUSCULAR | Status: AC
  Administered 2017-04-09: 25 mg via INTRAVENOUS

## 2017-04-09 MED ORDER — DIPHENHYDRAMINE HCL 50 MG/ML IJ SOLN
INTRAMUSCULAR | Status: AC
  Administered 2017-04-09: 25 mg via INTRAVENOUS
  Filled 2017-04-09: qty 1

## 2017-04-09 MED ORDER — HYDROXYZINE HCL 50 MG PO TABS
50.0000 mg | ORAL_TABLET | Freq: Once | ORAL | Status: AC
  Administered 2017-04-09: 50 mg via ORAL
  Filled 2017-04-09: qty 1

## 2017-04-09 NOTE — ED Triage Notes (Signed)
Pt reports that she has itchy rash all over her arms and on her trunk since last Wednesday.

## 2017-04-09 NOTE — ED Notes (Signed)
See triage note  Presents with itchy rash over 1 week ago.. States she was recently started on HCTZ ..thought that was what caused th itch  States she stopped that meds several days ago but still has the rash and itching
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
## 2017-05-04 ENCOUNTER — Encounter: Payer: Self-pay | Admitting: Emergency Medicine

## 2017-05-04 ENCOUNTER — Emergency Department
Admission: EM | Admit: 2017-05-04 | Discharge: 2017-05-05 | Disposition: A | Discharge status: Home / Self Care | Payer: Self-pay | Attending: Emergency Medicine | Admitting: Emergency Medicine

## 2017-05-04 DIAGNOSIS — D649 Anemia, unspecified: Secondary | ICD-10-CM | POA: Insufficient documentation

## 2017-05-04 DIAGNOSIS — E119 Type 2 diabetes mellitus without complications: Secondary | ICD-10-CM | POA: Insufficient documentation

## 2017-05-04 DIAGNOSIS — M5442 Lumbago with sciatica, left side: Secondary | ICD-10-CM | POA: Insufficient documentation

## 2017-05-04 DIAGNOSIS — J45909 Unspecified asthma, uncomplicated: Secondary | ICD-10-CM | POA: Insufficient documentation

## 2017-05-04 DIAGNOSIS — Z87891 Personal history of nicotine dependence: Secondary | ICD-10-CM | POA: Insufficient documentation

## 2017-05-04 DIAGNOSIS — Z791 Long term (current) use of non-steroidal anti-inflammatories (NSAID): Secondary | ICD-10-CM | POA: Insufficient documentation

## 2017-05-04 DIAGNOSIS — I10 Essential (primary) hypertension: Secondary | ICD-10-CM | POA: Insufficient documentation

## 2017-05-04 DIAGNOSIS — G8929 Other chronic pain: Secondary | ICD-10-CM | POA: Insufficient documentation

## 2017-05-04 DIAGNOSIS — Z79899 Other long term (current) drug therapy: Secondary | ICD-10-CM | POA: Insufficient documentation

## 2017-05-04 NOTE — ED Triage Notes (Signed)
Patient brought in by ems from home. Patient with complaint of chronic lower back pain but states that the pain has moved up higher tonight. Patient states that the pain has also started radiating down her left leg.

## 2017-05-05 LAB — URINALYSIS, COMPLETE (UACMP) WITH MICROSCOPIC
Bacteria, UA: NONE SEEN
Bilirubin Urine: NEGATIVE
Glucose, UA: NEGATIVE mg/dL
HGB URINE DIPSTICK: NEGATIVE
Ketones, ur: NEGATIVE mg/dL
Leukocytes, UA: NEGATIVE
NITRITE: NEGATIVE
PH: 5 (ref 5.0–8.0)
Protein, ur: 30 mg/dL — AB
SPECIFIC GRAVITY, URINE: 1.028 (ref 1.005–1.030)

## 2017-05-05 MED ORDER — OXYCODONE-ACETAMINOPHEN 5-325 MG PO TABS
1.0000 | ORAL_TABLET | Freq: Once | ORAL | Status: AC
  Administered 2017-05-05: 1 via ORAL
  Filled 2017-05-05: qty 1

## 2017-05-05 MED ORDER — DIAZEPAM 2 MG PO TABS: 2.0000 mg | ORAL_TABLET | Freq: Three times a day (TID) | ORAL | 0 refills | Status: DC | PRN

## 2017-05-05 MED ORDER — IBUPROFEN 800 MG PO TABS: 800.0000 mg | ORAL_TABLET | Freq: Three times a day (TID) | ORAL | 0 refills | Status: DC | PRN

## 2017-05-05 MED ORDER — KETOROLAC TROMETHAMINE 30 MG/ML IJ SOLN
60.0000 mg | Freq: Once | INTRAMUSCULAR | Status: AC
  Administered 2017-05-05: 60 mg via INTRAMUSCULAR
  Filled 2017-05-05: qty 2

## 2017-05-05 MED ORDER — DIAZEPAM 2 MG PO TABS
2.0000 mg | ORAL_TABLET | Freq: Once | ORAL | Status: AC
  Administered 2017-05-05: 2 mg via ORAL
  Filled 2017-05-05: qty 1

## 2017-05-05 MED ORDER — OXYCODONE-ACETAMINOPHEN 5-325 MG PO TABS: 1.0000 | ORAL_TABLET | ORAL | 0 refills | Status: DC | PRN

## 2017-05-05 NOTE — Discharge Instructions (Signed)
1. You may take medicines as needed for pain and muscle spasms (Motrin/Percocet/Valium #15). 2. Apply moist heat to affected area several times daily. 3. Return to the ER for worsening symptoms, persistent vomiting, difficulty breathing or other concerns.

## 2017-05-05 NOTE — ED Notes (Signed)
Patient does not have transportation, she will be dc to lobby
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
## 2017-05-19 ENCOUNTER — Observation Stay (HOSPITAL_COMMUNITY)
Admission: EM | Admit: 2017-05-19 | Discharge: 2017-05-21 | Disposition: A | Discharge status: Home / Self Care | Payer: Self-pay | Attending: Family Medicine | Admitting: Family Medicine

## 2017-05-19 ENCOUNTER — Encounter (HOSPITAL_COMMUNITY): Payer: Self-pay

## 2017-05-19 DIAGNOSIS — Z23 Encounter for immunization: Secondary | ICD-10-CM | POA: Insufficient documentation

## 2017-05-19 DIAGNOSIS — Z7984 Long term (current) use of oral hypoglycemic drugs: Secondary | ICD-10-CM | POA: Insufficient documentation

## 2017-05-19 DIAGNOSIS — G4733 Obstructive sleep apnea (adult) (pediatric): Secondary | ICD-10-CM | POA: Insufficient documentation

## 2017-05-19 DIAGNOSIS — I10 Essential (primary) hypertension: Secondary | ICD-10-CM | POA: Insufficient documentation

## 2017-05-19 DIAGNOSIS — E669 Obesity, unspecified: Secondary | ICD-10-CM | POA: Diagnosis present

## 2017-05-19 DIAGNOSIS — Z79899 Other long term (current) drug therapy: Secondary | ICD-10-CM | POA: Insufficient documentation

## 2017-05-19 DIAGNOSIS — F200 Paranoid schizophrenia: Secondary | ICD-10-CM

## 2017-05-19 DIAGNOSIS — Z91010 Allergy to peanuts: Secondary | ICD-10-CM | POA: Insufficient documentation

## 2017-05-19 DIAGNOSIS — N179 Acute kidney failure, unspecified: Principal | ICD-10-CM | POA: Insufficient documentation

## 2017-05-19 DIAGNOSIS — E1169 Type 2 diabetes mellitus with other specified complication: Secondary | ICD-10-CM | POA: Diagnosis present

## 2017-05-19 DIAGNOSIS — Z87891 Personal history of nicotine dependence: Secondary | ICD-10-CM | POA: Insufficient documentation

## 2017-05-19 DIAGNOSIS — E119 Type 2 diabetes mellitus without complications: Secondary | ICD-10-CM | POA: Insufficient documentation

## 2017-05-19 DIAGNOSIS — F319 Bipolar disorder, unspecified: Secondary | ICD-10-CM | POA: Insufficient documentation

## 2017-05-19 DIAGNOSIS — J45909 Unspecified asthma, uncomplicated: Secondary | ICD-10-CM | POA: Insufficient documentation

## 2017-05-19 LAB — URINALYSIS, ROUTINE W REFLEX MICROSCOPIC
BILIRUBIN URINE: NEGATIVE
GLUCOSE, UA: NEGATIVE mg/dL
HGB URINE DIPSTICK: NEGATIVE
Ketones, ur: 20 mg/dL — AB
LEUKOCYTES UA: NEGATIVE
NITRITE: NEGATIVE
PH: 5 (ref 5.0–8.0)
Protein, ur: 100 mg/dL — AB
Specific Gravity, Urine: 1.03 (ref 1.005–1.030)

## 2017-05-19 LAB — CBC WITH DIFFERENTIAL/PLATELET
BASOS ABS: 0 10*3/uL (ref 0.0–0.1)
BASOS PCT: 0 %
EOS PCT: 1 %
Eosinophils Absolute: 0.1 10*3/uL (ref 0.0–0.7)
HEMATOCRIT: 40.5 % (ref 36.0–46.0)
Hemoglobin: 12.9 g/dL (ref 12.0–15.0)
LYMPHS PCT: 23 %
Lymphs Abs: 2.1 10*3/uL (ref 0.7–4.0)
MCH: 25.6 pg — ABNORMAL LOW (ref 26.0–34.0)
MCHC: 31.9 g/dL (ref 30.0–36.0)
MCV: 80.5 fL (ref 78.0–100.0)
Monocytes Absolute: 0.7 10*3/uL (ref 0.1–1.0)
Monocytes Relative: 7 %
NEUTROS ABS: 6.2 10*3/uL (ref 1.7–7.7)
Neutrophils Relative %: 69 %
PLATELETS: 352 10*3/uL (ref 150–400)
RBC: 5.03 MIL/uL (ref 3.87–5.11)
RDW: 15 % (ref 11.5–15.5)
WBC: 9.1 10*3/uL (ref 4.0–10.5)

## 2017-05-19 LAB — RAPID URINE DRUG SCREEN, HOSP PERFORMED
AMPHETAMINES: NOT DETECTED
BARBITURATES: NOT DETECTED
Benzodiazepines: POSITIVE — AB
Cocaine: POSITIVE — AB
Opiates: NOT DETECTED
TETRAHYDROCANNABINOL: POSITIVE — AB

## 2017-05-19 LAB — BASIC METABOLIC PANEL
ANION GAP: 13 (ref 5–15)
BUN: 16 mg/dL (ref 6–20)
CALCIUM: 9.7 mg/dL (ref 8.9–10.3)
CO2: 23 mmol/L (ref 22–32)
Chloride: 106 mmol/L (ref 101–111)
Creatinine, Ser: 1 mg/dL (ref 0.44–1.00)
GLUCOSE: 137 mg/dL — AB (ref 65–99)
POTASSIUM: 3 mmol/L — AB (ref 3.5–5.1)
Sodium: 142 mmol/L (ref 135–145)

## 2017-05-19 LAB — ETHANOL: Alcohol, Ethyl (B): <10 mg/dL (ref <10)

## 2017-05-19 MED ORDER — ATENOLOL 25 MG PO TABS
100.0000 mg | ORAL_TABLET | Freq: Two times a day (BID) | ORAL | Status: DC
  Administered 2017-05-19: 100 mg via ORAL
  Administered 2017-05-19: 50 mg via ORAL
  Administered 2017-05-20 – 2017-05-21 (×3): 100 mg via ORAL
  Filled 2017-05-19 (×5): qty 4

## 2017-05-19 MED ORDER — ALBUTEROL SULFATE HFA 108 (90 BASE) MCG/ACT IN AERS
2.0000 | INHALATION_SPRAY | Freq: Four times a day (QID) | RESPIRATORY_TRACT | Status: DC | PRN
  Filled 2017-05-19: qty 6.7

## 2017-05-19 MED ORDER — LORAZEPAM 2 MG/ML IJ SOLN
1.0000 mg | Freq: Once | INTRAMUSCULAR | Status: AC
  Administered 2017-05-19: 1 mg via INTRAVENOUS
  Filled 2017-05-19: qty 1

## 2017-05-19 MED ORDER — QUETIAPINE FUMARATE 100 MG PO TABS
600.0000 mg | ORAL_TABLET | Freq: Every day | ORAL | Status: DC
  Administered 2017-05-19 – 2017-05-20 (×2): 600 mg via ORAL
  Filled 2017-05-19 (×2): qty 6

## 2017-05-19 MED ORDER — DIAZEPAM 2 MG PO TABS
2.0000 mg | ORAL_TABLET | Freq: Three times a day (TID) | ORAL | Status: DC | PRN
  Administered 2017-05-19 – 2017-05-20 (×2): 2 mg via ORAL
  Filled 2017-05-19 (×2): qty 1

## 2017-05-19 MED ORDER — PAROXETINE HCL 20 MG PO TABS
20.0000 mg | ORAL_TABLET | Freq: Every day | ORAL | Status: DC
  Administered 2017-05-19 – 2017-05-21 (×3): 20 mg via ORAL
  Filled 2017-05-19 (×4): qty 1

## 2017-05-19 MED ORDER — IBUPROFEN 800 MG PO TABS
800.0000 mg | ORAL_TABLET | Freq: Three times a day (TID) | ORAL | Status: DC | PRN
  Administered 2017-05-19: 800 mg via ORAL
  Filled 2017-05-19: qty 1

## 2017-05-19 MED ORDER — CLONIDINE HCL 0.2 MG PO TABS
0.3000 mg | ORAL_TABLET | Freq: Two times a day (BID) | ORAL | Status: DC
  Administered 2017-05-19 – 2017-05-21 (×4): 0.3 mg via ORAL
  Filled 2017-05-19 (×4): qty 1

## 2017-05-19 MED ORDER — POTASSIUM CHLORIDE CRYS ER 20 MEQ PO TBCR
40.0000 meq | EXTENDED_RELEASE_TABLET | Freq: Once | ORAL | Status: AC
  Administered 2017-05-19: 40 meq via ORAL
  Filled 2017-05-19: qty 2

## 2017-05-19 MED ORDER — MONTELUKAST SODIUM 10 MG PO TABS
10.0000 mg | ORAL_TABLET | Freq: Every day | ORAL | Status: DC
  Administered 2017-05-19 – 2017-05-21 (×3): 10 mg via ORAL
  Filled 2017-05-19 (×3): qty 1

## 2017-05-19 MED ORDER — CLONIDINE HCL 0.2 MG PO TABS
0.3000 mg | ORAL_TABLET | Freq: Once | ORAL | Status: AC
  Administered 2017-05-19: 14:00:00 0.3 mg via ORAL
  Filled 2017-05-19: qty 1

## 2017-05-19 MED ORDER — ATENOLOL 25 MG PO TABS
50.0000 mg | ORAL_TABLET | Freq: Once | ORAL | Status: AC
  Administered 2017-05-19: 50 mg via ORAL
  Filled 2017-05-19: qty 2

## 2017-05-19 MED ORDER — METOPROLOL TARTRATE 5 MG/5ML IV SOLN
5.0000 mg | Freq: Once | INTRAVENOUS | Status: AC
  Administered 2017-05-19: 5 mg via INTRAVENOUS
  Filled 2017-05-19: qty 5

## 2017-05-19 MED ORDER — OXYCODONE-ACETAMINOPHEN 5-325 MG PO TABS
1.0000 | ORAL_TABLET | ORAL | Status: DC | PRN
  Administered 2017-05-19 – 2017-05-21 (×5): 1 via ORAL
  Filled 2017-05-19 (×5): qty 1

## 2017-05-19 MED ORDER — ONDANSETRON 4 MG PO TBDP: 4.0000 mg | ORAL_TABLET | Freq: Three times a day (TID) | ORAL | Status: DC | PRN

## 2017-05-19 MED ORDER — GABAPENTIN 300 MG PO CAPS
300.0000 mg | ORAL_CAPSULE | Freq: Every day | ORAL | Status: DC
  Administered 2017-05-19 – 2017-05-20 (×2): 300 mg via ORAL
  Filled 2017-05-19 (×2): qty 1

## 2017-05-19 MED ORDER — METFORMIN HCL 500 MG PO TABS
500.0000 mg | ORAL_TABLET | Freq: Two times a day (BID) | ORAL | Status: DC
  Administered 2017-05-19 – 2017-05-20 (×2): 500 mg via ORAL
  Filled 2017-05-19 (×2): qty 1

## 2017-05-19 MED ORDER — IBUPROFEN 800 MG PO TABS
800.0000 mg | ORAL_TABLET | Freq: Once | ORAL | Status: AC
  Administered 2017-05-19: 800 mg via ORAL
  Filled 2017-05-19: qty 1

## 2017-05-19 MED ORDER — PANTOPRAZOLE SODIUM 40 MG PO TBEC
40.0000 mg | DELAYED_RELEASE_TABLET | Freq: Every day | ORAL | Status: DC
  Administered 2017-05-19 – 2017-05-21 (×3): 40 mg via ORAL
  Filled 2017-05-19 (×3): qty 1

## 2017-05-19 MED ORDER — LORAZEPAM 2 MG/ML IJ SOLN
1.0000 mg | Freq: Once | INTRAMUSCULAR | Status: DC
  Administered 2017-05-19: 1 mg via INTRAMUSCULAR

## 2017-05-19 NOTE — Progress Notes (Signed)
Pt meets criteria for inpatient admission; CSW faxed referral packet to the following facilities in attempts to secure inpatient bed:   [address] [place], [address], [facility], [facility], [facility], [facility], [facility]  TTS will continue to seek placement.   Adriana Reams, LCSW Clinical Social Work [phone]

## 2017-05-19 NOTE — ED Notes (Signed)
Patient given a salad instead of meal tray. Pt states she has had no appetite.

## 2017-05-19 NOTE — ED Notes (Signed)
Patient's belongings consist of clothing and medications in a plastic grocery bag. Jackie, weave, pants, socks, bracelet. Pt phone turned off and placed in the front of her purse. All items placed in pt belongings bags (3 total) and placed in 3 individual lockers labeled with pt stickers.

## 2017-05-19 NOTE — BH Assessment (Signed)
Patricia Shiley NP recommends inpatient treatment. TTS to look for placement
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
## 2017-05-19 NOTE — ED Notes (Signed)
IVC papers faxed to Magistrate and called. 
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
## 2017-05-19 NOTE — ED Triage Notes (Signed)
Pt reports has been depressed, hasn't had meds in 7 or 8 days and hasn't eaten much over the past 5 days.  Pt tearful during triage, appears anxious.  CBG 172, hr 140's, and elevated bp.  PT says has been having SI since the weekend.

## 2017-05-20 ENCOUNTER — Observation Stay (HOSPITAL_COMMUNITY): Payer: Self-pay

## 2017-05-20 ENCOUNTER — Encounter (HOSPITAL_COMMUNITY): Payer: Self-pay

## 2017-05-20 DIAGNOSIS — F1910 Other psychoactive substance abuse, uncomplicated: Secondary | ICD-10-CM

## 2017-05-20 DIAGNOSIS — N179 Acute kidney failure, unspecified: Secondary | ICD-10-CM

## 2017-05-20 DIAGNOSIS — F209 Schizophrenia, unspecified: Secondary | ICD-10-CM

## 2017-05-20 DIAGNOSIS — F419 Anxiety disorder, unspecified: Secondary | ICD-10-CM

## 2017-05-20 DIAGNOSIS — F200 Paranoid schizophrenia: Secondary | ICD-10-CM

## 2017-05-20 DIAGNOSIS — Z634 Disappearance and death of family member: Secondary | ICD-10-CM

## 2017-05-20 DIAGNOSIS — Z599 Problem related to housing and economic circumstances, unspecified: Secondary | ICD-10-CM

## 2017-05-20 DIAGNOSIS — F1210 Cannabis abuse, uncomplicated: Secondary | ICD-10-CM

## 2017-05-20 DIAGNOSIS — R450 Nervousness: Secondary | ICD-10-CM

## 2017-05-20 DIAGNOSIS — R443 Hallucinations, unspecified: Secondary | ICD-10-CM

## 2017-05-20 DIAGNOSIS — Z590 Homelessness: Secondary | ICD-10-CM

## 2017-05-20 DIAGNOSIS — R45851 Suicidal ideations: Secondary | ICD-10-CM

## 2017-05-20 DIAGNOSIS — Z87891 Personal history of nicotine dependence: Secondary | ICD-10-CM

## 2017-05-20 HISTORY — DX: Acute kidney failure, unspecified: N17.9

## 2017-05-20 LAB — BASIC METABOLIC PANEL
Anion gap: 11 (ref 5–15)
BUN: 31 mg/dL — ABNORMAL HIGH (ref 6–20)
CHLORIDE: 106 mmol/L (ref 101–111)
CO2: 22 mmol/L (ref 22–32)
Calcium: 9.6 mg/dL (ref 8.9–10.3)
Creatinine, Ser: 2.62 mg/dL — ABNORMAL HIGH (ref 0.44–1.00)
GFR calc non Af Amer: 21 mL/min — ABNORMAL LOW (ref >60)
GFR, EST AFRICAN AMERICAN: 24 mL/min — AB (ref >60)
Glucose, Bld: 134 mg/dL — ABNORMAL HIGH (ref 65–99)
POTASSIUM: 4.1 mmol/L (ref 3.5–5.1)
SODIUM: 139 mmol/L (ref 135–145)

## 2017-05-20 LAB — CBC
HCT: 36.5 % (ref 36.0–46.0)
Hemoglobin: 11.6 g/dL — ABNORMAL LOW (ref 12.0–15.0)
MCH: 25.9 pg — ABNORMAL LOW (ref 26.0–34.0)
MCHC: 31.8 g/dL (ref 30.0–36.0)
MCV: 81.5 fL (ref 78.0–100.0)
PLATELETS: 307 10*3/uL (ref 150–400)
RBC: 4.48 MIL/uL (ref 3.87–5.11)
RDW: 15.2 % (ref 11.5–15.5)
WBC: 8.2 10*3/uL (ref 4.0–10.5)

## 2017-05-20 LAB — CREATININE, SERUM
Creatinine, Ser: 2.96 mg/dL — ABNORMAL HIGH (ref 0.44–1.00)
GFR, EST AFRICAN AMERICAN: 21 mL/min — AB (ref >60)
GFR, EST NON AFRICAN AMERICAN: 18 mL/min — AB (ref >60)

## 2017-05-20 LAB — I-STAT CHEM 8, ED
BUN: 33 mg/dL — ABNORMAL HIGH (ref 6–20)
CALCIUM ION: 1.15 mmol/L (ref 1.15–1.40)
Chloride: 110 mmol/L (ref 101–111)
Creatinine, Ser: 2.8 mg/dL — ABNORMAL HIGH (ref 0.44–1.00)
Glucose, Bld: 129 mg/dL — ABNORMAL HIGH (ref 65–99)
HCT: 39 % (ref 36.0–46.0)
HEMOGLOBIN: 13.3 g/dL (ref 12.0–15.0)
Potassium: 3.9 mmol/L (ref 3.5–5.1)
Sodium: 141 mmol/L (ref 135–145)
TCO2: 22 mmol/L (ref 22–32)

## 2017-05-20 LAB — CBG MONITORING, ED: Glucose-Capillary: 147 mg/dL — ABNORMAL HIGH (ref 65–99)

## 2017-05-20 MED ORDER — ACETAMINOPHEN 650 MG RE SUPP: 650.0000 mg | Freq: Four times a day (QID) | RECTAL | Status: DC | PRN

## 2017-05-20 MED ORDER — INFLUENZA VAC SPLIT QUAD 0.5 ML IM SUSY
0.5000 mL | PREFILLED_SYRINGE | INTRAMUSCULAR | Status: AC
  Administered 2017-05-21: 0.5 mL via INTRAMUSCULAR
  Filled 2017-05-20: qty 0.5

## 2017-05-20 MED ORDER — PNEUMOCOCCAL VAC POLYVALENT 25 MCG/0.5ML IJ INJ
0.5000 mL | INJECTION | INTRAMUSCULAR | Status: AC
  Administered 2017-05-21: 0.5 mL via INTRAMUSCULAR
  Filled 2017-05-20: qty 0.5

## 2017-05-20 MED ORDER — SODIUM CHLORIDE 0.9 % IV BOLUS (SEPSIS)
1000.0000 mL | Freq: Once | INTRAVENOUS | Status: AC
  Administered 2017-05-20: 1000 mL via INTRAVENOUS

## 2017-05-20 MED ORDER — ORAL CARE MOUTH RINSE
15.0000 mL | Freq: Two times a day (BID) | OROMUCOSAL | Status: DC
  Administered 2017-05-21: 15 mL via OROMUCOSAL

## 2017-05-20 MED ORDER — SODIUM CHLORIDE 0.9 % IV SOLN: INTRAVENOUS | Status: DC

## 2017-05-20 MED ORDER — ALBUTEROL SULFATE (2.5 MG/3ML) 0.083% IN NEBU: 2.5000 mg | INHALATION_SOLUTION | Freq: Four times a day (QID) | RESPIRATORY_TRACT | Status: DC | PRN

## 2017-05-20 MED ORDER — SODIUM CHLORIDE 0.9 % IV SOLN
INTRAVENOUS | Status: DC
  Administered 2017-05-20 – 2017-05-21 (×2): via INTRAVENOUS

## 2017-05-20 MED ORDER — DIPHENHYDRAMINE HCL 25 MG PO CAPS
25.0000 mg | ORAL_CAPSULE | Freq: Once | ORAL | Status: AC
  Administered 2017-05-20: 25 mg via ORAL
  Filled 2017-05-20: qty 1

## 2017-05-20 MED ORDER — POLYETHYLENE GLYCOL 3350 17 G PO PACK: 17.0000 g | PACK | Freq: Every day | ORAL | Status: DC | PRN

## 2017-05-20 MED ORDER — ACETAMINOPHEN 325 MG PO TABS: 650.0000 mg | ORAL_TABLET | Freq: Four times a day (QID) | ORAL | Status: DC | PRN

## 2017-05-20 MED ORDER — ENOXAPARIN SODIUM 40 MG/0.4ML ~~LOC~~ SOLN
40.0000 mg | SUBCUTANEOUS | Status: DC
  Filled 2017-05-20: qty 0.4

## 2017-05-20 NOTE — ED Notes (Signed)
Per sitter pt has not voided since 7am

## 2017-05-20 NOTE — ED Notes (Signed)
Pt completed TTS and is currently upset and crying out loud

## 2017-05-20 NOTE — ED Notes (Signed)
Holding pt.'s food until after Admitting doctor makes rounds
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
## 2017-05-20 NOTE — Progress Notes (Signed)
Patient read handout pretaining to behavior health rules, and handout placed on door.  Patient verbalized understanding about visitation, outside food and phone usage.

## 2017-05-20 NOTE — ED Provider Notes (Signed)
Potassium low yesterday, BMP re-checked today: Potassium normal but BUN/Cr elevated. Pt only c/o "muscles cramping." Percocet and valium given. Pt awake/alert, NAD, resps easy. Pt has been tol PO well without N/V. IVF bolus also given. I-stat chem re-checked without improvement in BUN/Cr. Will medically admit.  T/C to [facility] Dr. Adair Patter, case discussed, including:  HPI, pertinent PM/SHx, VS/PE, dx testing, ED course and treatment:  Agreeable to admit.    Results for orders placed or performed during the hospital encounter of 05/19/17  CBC with Differential  Result Value Ref Range   WBC 9.1 4.0 - 10.5 K/uL   RBC 5.03 3.87 - 5.11 MIL/uL   Hemoglobin 12.9 12.0 - 15.0 g/dL   HCT 40.5 36.0 - 46.0 %   MCV 80.5 78.0 - 100.0 fL   MCH 25.6 (L) 26.0 - 34.0 pg   MCHC 31.9 30.0 - 36.0 g/dL   RDW 15.0 11.5 - 15.5 %   Platelets 352 150 - 400 K/uL   Neutrophils Relative % 69 %   Neutro Abs 6.2 1.7 - 7.7 K/uL   Lymphocytes Relative 23 %   Lymphs Abs 2.1 0.7 - 4.0 K/uL   Monocytes Relative 7 %   Monocytes Absolute 0.7 0.1 - 1.0 K/uL   Eosinophils Relative 1 %   Eosinophils Absolute 0.1 0.0 - 0.7 K/uL   Basophils Relative 0 %   Basophils Absolute 0.0 0.0 - 0.1 K/uL  Basic metabolic panel  Result Value Ref Range   Sodium 142 135 - 145 mmol/L   Potassium 3.0 (L) 3.5 - 5.1 mmol/L   Chloride 106 101 - 111 mmol/L   CO2 23 22 - 32 mmol/L   Glucose, Bld 137 (H) 65 - 99 mg/dL   BUN 16 6 - 20 mg/dL   Creatinine, Ser 1.00 0.44 - 1.00 mg/dL   Calcium 9.7 8.9 - 10.3 mg/dL   GFR calc non Af Amer >60 >60 mL/min   GFR calc Af Amer >60 >60 mL/min   Anion gap 13 5 - 15  Urinalysis, Routine w reflex microscopic  Result Value Ref Range   Color, Urine AMBER (A) YELLOW   APPearance CLOUDY (A) CLEAR   Specific Gravity, Urine 1.030 1.005 - 1.030   pH 5.0 5.0 - 8.0   Glucose, UA NEGATIVE NEGATIVE mg/dL   Hgb urine dipstick NEGATIVE NEGATIVE   Bilirubin Urine NEGATIVE NEGATIVE   Ketones, ur 20 (A) NEGATIVE mg/dL    Protein, ur 100 (A) NEGATIVE mg/dL   Nitrite NEGATIVE NEGATIVE   Leukocytes, UA NEGATIVE NEGATIVE   RBC / HPF 0-5 0 - 5 RBC/hpf   WBC, UA 6-30 0 - 5 WBC/hpf   Bacteria, UA RARE (A) NONE SEEN   Squamous Epithelial / LPF 6-30 (A) NONE SEEN   WBC Clumps PRESENT    Mucus PRESENT   Ethanol  Result Value Ref Range   Alcohol, Ethyl (B) <10 <10 mg/dL  Rapid urine drug screen (hospital performed)  Result Value Ref Range   Opiates NONE DETECTED NONE DETECTED   Cocaine POSITIVE (A) NONE DETECTED   Benzodiazepines POSITIVE (A) NONE DETECTED   Amphetamines NONE DETECTED NONE DETECTED   Tetrahydrocannabinol POSITIVE (A) NONE DETECTED   Barbiturates NONE DETECTED NONE DETECTED  Basic metabolic panel  Result Value Ref Range   Sodium 139 135 - 145 mmol/L   Potassium 4.1 3.5 - 5.1 mmol/L   Chloride 106 101 - 111 mmol/L   CO2 22 22 - 32 mmol/L   Glucose, Bld 134 (H)  65 - 99 mg/dL   BUN 31 (H) 6 - 20 mg/dL   Creatinine, Ser 2.62 (H) 0.44 - 1.00 mg/dL   Calcium 9.6 8.9 - 10.3 mg/dL   GFR calc non Af Amer 21 (L) >60 mL/min   GFR calc Af Amer 24 (L) >60 mL/min   Anion gap 11 5 - 15  CBG monitoring, ED  Result Value Ref Range   Glucose-Capillary 147 (H) 65 - 99 mg/dL  I-stat Chem 8, ED  Result Value Ref Range   Sodium 141 135 - 145 mmol/L   Potassium 3.9 3.5 - 5.1 mmol/L   Chloride 110 101 - 111 mmol/L   BUN 33 (H) 6 - 20 mg/dL   Creatinine, Ser 2.80 (H) 0.44 - 1.00 mg/dL   Glucose, Bld 129 (H) 65 - 99 mg/dL   Calcium, Ion 1.15 1.15 - 1.40 mmol/L   TCO2 22 22 - 32 mmol/L   Hemoglobin 13.3 12.0 - 15.0 g/dL   HCT 39.0 36.0 - 46.0 %       Francine Graven, DO 05/20/17 1311

## 2017-05-20 NOTE — ED Notes (Signed)
Pt reports cramps in hands and legs. Will recheck BMP per Dr Thurnell Garbe

## 2017-05-20 NOTE — Progress Notes (Signed)
Per Letitia Libra , [facility], patient has been accepted to [facility], bed 508-1 ; Accepting provider is Hughie Closs, NP; Attending provider is Dr. Parke Poisson.  Patient can arrive now, bed is ready. Number for report is [phone].  Apolonio Schneiders, RN notified and agreed to notify the patient's assigned nurse,  Liz Beach, RN.    Radonna Ricker MSW, [place] Disposition [phone]

## 2017-05-20 NOTE — ED Notes (Signed)
TTS in process 
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
## 2017-05-21 ENCOUNTER — Inpatient Hospital Stay (HOSPITAL_COMMUNITY)
Admission: AD | Admit: 2017-05-21 | Discharge: 2017-05-29 | DRG: 885 | Disposition: A | Discharge status: Home / Self Care | Payer: Medicaid Other | Attending: Psychiatry | Admitting: Psychiatry

## 2017-05-21 ENCOUNTER — Encounter (HOSPITAL_COMMUNITY): Payer: Self-pay

## 2017-05-21 DIAGNOSIS — Z590 Homelessness: Secondary | ICD-10-CM | POA: Diagnosis not present

## 2017-05-21 DIAGNOSIS — Z87891 Personal history of nicotine dependence: Secondary | ICD-10-CM | POA: Diagnosis not present

## 2017-05-21 DIAGNOSIS — K219 Gastro-esophageal reflux disease without esophagitis: Secondary | ICD-10-CM | POA: Diagnosis present

## 2017-05-21 DIAGNOSIS — E669 Obesity, unspecified: Secondary | ICD-10-CM

## 2017-05-21 DIAGNOSIS — F209 Schizophrenia, unspecified: Principal | ICD-10-CM | POA: Diagnosis present

## 2017-05-21 DIAGNOSIS — G43909 Migraine, unspecified, not intractable, without status migrainosus: Secondary | ICD-10-CM | POA: Diagnosis present

## 2017-05-21 DIAGNOSIS — K649 Unspecified hemorrhoids: Secondary | ICD-10-CM | POA: Diagnosis present

## 2017-05-21 DIAGNOSIS — J42 Unspecified chronic bronchitis: Secondary | ICD-10-CM | POA: Diagnosis present

## 2017-05-21 DIAGNOSIS — Z6841 Body Mass Index (BMI) 40.0 and over, adult: Secondary | ICD-10-CM

## 2017-05-21 DIAGNOSIS — E7800 Pure hypercholesterolemia, unspecified: Secondary | ICD-10-CM | POA: Diagnosis present

## 2017-05-21 DIAGNOSIS — Z79899 Other long term (current) drug therapy: Secondary | ICD-10-CM

## 2017-05-21 DIAGNOSIS — F419 Anxiety disorder, unspecified: Secondary | ICD-10-CM | POA: Diagnosis present

## 2017-05-21 DIAGNOSIS — G4733 Obstructive sleep apnea (adult) (pediatric): Secondary | ICD-10-CM

## 2017-05-21 DIAGNOSIS — G4700 Insomnia, unspecified: Secondary | ICD-10-CM | POA: Diagnosis present

## 2017-05-21 DIAGNOSIS — J45909 Unspecified asthma, uncomplicated: Secondary | ICD-10-CM | POA: Diagnosis present

## 2017-05-21 DIAGNOSIS — M545 Low back pain: Secondary | ICD-10-CM | POA: Diagnosis present

## 2017-05-21 DIAGNOSIS — N179 Acute kidney failure, unspecified: Principal | ICD-10-CM

## 2017-05-21 DIAGNOSIS — E119 Type 2 diabetes mellitus without complications: Secondary | ICD-10-CM | POA: Diagnosis present

## 2017-05-21 DIAGNOSIS — G8929 Other chronic pain: Secondary | ICD-10-CM | POA: Diagnosis present

## 2017-05-21 DIAGNOSIS — D649 Anemia, unspecified: Secondary | ICD-10-CM | POA: Diagnosis present

## 2017-05-21 DIAGNOSIS — F329 Major depressive disorder, single episode, unspecified: Secondary | ICD-10-CM | POA: Diagnosis present

## 2017-05-21 DIAGNOSIS — I10 Essential (primary) hypertension: Secondary | ICD-10-CM | POA: Diagnosis present

## 2017-05-21 DIAGNOSIS — E1169 Type 2 diabetes mellitus with other specified complication: Secondary | ICD-10-CM

## 2017-05-21 DIAGNOSIS — F3130 Bipolar disorder, current episode depressed, mild or moderate severity, unspecified: Secondary | ICD-10-CM

## 2017-05-21 LAB — URINE CULTURE

## 2017-05-21 LAB — BASIC METABOLIC PANEL
Anion gap: 8 (ref 5–15)
BUN: 32 mg/dL — AB (ref 6–20)
CALCIUM: 8.7 mg/dL — AB (ref 8.9–10.3)
CO2: 23 mmol/L (ref 22–32)
CREATININE: 1.63 mg/dL — AB (ref 0.44–1.00)
Chloride: 107 mmol/L (ref 101–111)
GFR calc Af Amer: 42 mL/min — ABNORMAL LOW (ref >60)
GFR, EST NON AFRICAN AMERICAN: 37 mL/min — AB (ref >60)
Glucose, Bld: 138 mg/dL — ABNORMAL HIGH (ref 65–99)
POTASSIUM: 3.3 mmol/L — AB (ref 3.5–5.1)
SODIUM: 138 mmol/L (ref 135–145)

## 2017-05-21 LAB — HIV ANTIBODY (ROUTINE TESTING W REFLEX): HIV SCREEN 4TH GENERATION: NONREACTIVE

## 2017-05-21 MED ORDER — OXYCODONE-ACETAMINOPHEN 5-325 MG PO TABS: 1.0000 | ORAL_TABLET | Freq: Three times a day (TID) | ORAL | 0 refills | Status: DC | PRN

## 2017-05-21 MED ORDER — ACETAMINOPHEN 325 MG PO TABS
650.0000 mg | ORAL_TABLET | Freq: Four times a day (QID) | ORAL | Status: DC | PRN
  Administered 2017-05-21 – 2017-05-27 (×7): 650 mg via ORAL
  Filled 2017-05-21 (×6): qty 2

## 2017-05-21 MED ORDER — METHOCARBAMOL 500 MG PO TABS
500.0000 mg | ORAL_TABLET | Freq: Four times a day (QID) | ORAL | Status: DC | PRN
  Administered 2017-05-21 – 2017-05-28 (×13): 500 mg via ORAL
  Filled 2017-05-21 (×13): qty 1

## 2017-05-21 MED ORDER — TRAZODONE HCL 50 MG PO TABS
50.0000 mg | ORAL_TABLET | Freq: Every evening | ORAL | Status: DC | PRN
  Administered 2017-05-21 – 2017-05-28 (×7): 50 mg via ORAL
  Filled 2017-05-21 (×3): qty 1
  Filled 2017-05-21: qty 7
  Filled 2017-05-21 (×4): qty 1

## 2017-05-21 MED ORDER — ATENOLOL 100 MG PO TABS
100.0000 mg | ORAL_TABLET | Freq: Two times a day (BID) | ORAL | Status: DC
  Administered 2017-05-21 – 2017-05-29 (×16): 100 mg via ORAL
  Filled 2017-05-21 (×11): qty 1
  Filled 2017-05-21: qty 2
  Filled 2017-05-21 (×9): qty 1

## 2017-05-21 MED ORDER — PAROXETINE HCL 20 MG PO TABS
20.0000 mg | ORAL_TABLET | Freq: Every day | ORAL | Status: DC
  Administered 2017-05-22 – 2017-05-29 (×8): 20 mg via ORAL
  Filled 2017-05-21 (×5): qty 1
  Filled 2017-05-21 (×2): qty 7
  Filled 2017-05-21 (×4): qty 1

## 2017-05-21 MED ORDER — POTASSIUM CHLORIDE CRYS ER 20 MEQ PO TBCR
40.0000 meq | EXTENDED_RELEASE_TABLET | Freq: Once | ORAL | Status: AC
  Administered 2017-05-21: 40 meq via ORAL
  Filled 2017-05-21: qty 2

## 2017-05-21 MED ORDER — LINAGLIPTIN 5 MG PO TABS: 5.0000 mg | ORAL_TABLET | Freq: Every day | ORAL | 0 refills | Status: DC

## 2017-05-21 MED ORDER — QUETIAPINE FUMARATE 300 MG PO TABS
600.0000 mg | ORAL_TABLET | Freq: Every day | ORAL | Status: DC
  Administered 2017-05-21: 600 mg via ORAL
  Filled 2017-05-21 (×4): qty 2

## 2017-05-21 MED ORDER — GABAPENTIN 300 MG PO CAPS
300.0000 mg | ORAL_CAPSULE | Freq: Every day | ORAL | Status: DC
  Administered 2017-05-21 – 2017-05-22 (×2): 300 mg via ORAL
  Filled 2017-05-21 (×5): qty 1

## 2017-05-21 MED ORDER — MONTELUKAST SODIUM 10 MG PO TABS
10.0000 mg | ORAL_TABLET | Freq: Every day | ORAL | Status: DC
  Administered 2017-05-22 – 2017-05-29 (×8): 10 mg via ORAL
  Filled 2017-05-21 (×10): qty 1

## 2017-05-21 MED ORDER — PANTOPRAZOLE SODIUM 40 MG PO TBEC
40.0000 mg | DELAYED_RELEASE_TABLET | Freq: Every day | ORAL | Status: DC
  Administered 2017-05-22 – 2017-05-29 (×8): 40 mg via ORAL
  Filled 2017-05-21 (×10): qty 1

## 2017-05-21 MED ORDER — MAGNESIUM HYDROXIDE 400 MG/5ML PO SUSP: 30.0000 mL | Freq: Every day | ORAL | Status: DC | PRN

## 2017-05-21 MED ORDER — ALUM & MAG HYDROXIDE-SIMETH 200-200-20 MG/5ML PO SUSP: 30.0000 mL | ORAL | Status: DC | PRN

## 2017-05-21 MED ORDER — HYDROXYZINE HCL 25 MG PO TABS
25.0000 mg | ORAL_TABLET | Freq: Three times a day (TID) | ORAL | Status: DC | PRN
  Administered 2017-05-22 – 2017-05-28 (×8): 25 mg via ORAL
  Filled 2017-05-21 (×3): qty 1
  Filled 2017-05-21: qty 10
  Filled 2017-05-21 (×6): qty 1

## 2017-05-21 MED ORDER — CLONIDINE HCL 0.1 MG PO TABS
0.3000 mg | ORAL_TABLET | Freq: Two times a day (BID) | ORAL | Status: DC
  Administered 2017-05-21 – 2017-05-29 (×16): 0.3 mg via ORAL
  Filled 2017-05-21: qty 1
  Filled 2017-05-21: qty 3
  Filled 2017-05-21 (×6): qty 1
  Filled 2017-05-21: qty 3
  Filled 2017-05-21 (×4): qty 1
  Filled 2017-05-21 (×2): qty 3
  Filled 2017-05-21 (×6): qty 1
  Filled 2017-05-21 (×2): qty 3
  Filled 2017-05-21: qty 1
  Filled 2017-05-21: qty 3

## 2017-05-21 MED ORDER — DOCUSATE SODIUM 100 MG PO CAPS
100.0000 mg | ORAL_CAPSULE | Freq: Two times a day (BID) | ORAL | Status: DC | PRN
  Administered 2017-05-23 – 2017-05-25 (×3): 100 mg via ORAL
  Filled 2017-05-21 (×3): qty 1

## 2017-05-21 NOTE — Tx Team (Signed)
Initial Treatment Plan 05/21/2017 11:05 PM Patricia [name] VQX:[id number]    PATIENT STRESSORS: Financial difficulties Health problems Loss of her uncle three years ago - grief Medication change or noncompliance   PATIENT STRENGTHS: Ability for insight Active sense of humor Average or above average intelligence Communication skills General fund of knowledge Motivation for treatment/growth Supportive family/friends   PATIENT IDENTIFIED PROBLEMS: Depression- "I'm tired emotionally. I want to get over this hump."  "I have had a hard time remembering things."  "I want to be independent but I am scared to be by myself."  Medication management  "Thank you for listening. I just wanted someone to listen."             DISCHARGE CRITERIA:  Ability to meet basic life and health needs Improved stabilization in mood, thinking, and/or behavior Motivation to continue treatment in a less acute level of care Safe-care adequate arrangements made Verbal commitment to aftercare and medication compliance  PRELIMINARY DISCHARGE PLAN: Outpatient therapy  PATIENT/FAMILY INVOLVEMENT: This treatment plan has been presented to and reviewed with the patient, Patricia [name] . The patient and family have been given the opportunity to ask questions and make suggestions.  Elenore Rota, RN 05/21/2017, 11:05 PM

## 2017-05-21 NOTE — Discharge Summary (Signed)
Physician Discharge Summary  Patricia [name] [id number]:[id number] DOB: 1971-05-07 DOA: 05/19/2017  PCP: Marliss Coots, NP  Admit date: 05/19/2017 Discharge date: 05/21/2017  Admitted From: Home  Disposition: [facility]   Recommendations for Outpatient Follow-up:  1. Follow up with PCP in 1 weeks 2. Please obtain BMP/CBC in one week 3. Monitor blood sugars 3 times per day 4. Please avoid nephrotoxins for at least 2 weeks  Discharge Condition: STABLE   CODE STATUS: FULL    Brief Hospitalization Summary: Please see all hospital notes, images, labs for full details of the hospitalization.  HPI: Patricia [name] is a 46 y.o. female with medical history significant of bipolar disorder, depression, anxiety, anemia, HLD, DM that came to the hospital on 10/22 secondary to feeling down and not having a place to live.  Three days prior to presentation she came to [facility] and she said she didn't feel well.  She took metformin, smoked a blunt and drank a bottle of alcohol without feeling any better.  Yesterday her aunt said things to the patient that made her feel down.  She felt she didn't want to live.  She came to the hospital because she wants to get better and wants to get her medication figured out.  She wants help at home and wants someone to know how she feels. She repeatedly states she is tired. She feels like her life isn't going anywhere and no one will help her.  She doesn't have anything.  Occasional chills (patient had hysterectomy two years ago), anorexia and shortness of breath (patient has asthma).  No hematuria or dysuria.  Occasional constipation.   ED Course: patient in ED since 10/22 and was placed on involuntary commitment. She was monitored in the ED and on 10/23 found to be in acute renal failure.  TRH was asked to admit.  The patient was admitted into the hospital and started on IV fluid hydration with good results.  Renal function slowly improving.   Creatinine now less than 2.  Nephrotoxic medications held.  The patient was taken off ibuprofen and metformin was discontinued as well.  The patient responded very well to IV fluid hydration.  The patient started eating and drinking again.  The patient was eating quite well prior to discharge.  The patient will be discharged to go directly to behavioral health Hospital for further treatment.  The patient is medically stable for discharge to behavioral health.  I recommended that the metformin be discontinued for now.  She has been started on Tradjenta 5 mg p.o. daily that she can take for diabetes treatment and no dose adjustments required for renal insufficiency.  I recommend having BMP retested in 1 week.  Avoid nephrotoxic medications and agents for at least 2 weeks.  Follow BMP by having it retested in the next week.  Encourage oral eating and drinking.  Monitor blood glucose 3 times per day.  Discharge Diagnoses:  Principal Problem:   Acute renal failure (ARF) (HCC) Active Problems:   Diabetes mellitus type 2 in obese ([place])   BIPOLAR DISORDER UNSPECIFIED   Obstructive sleep apnea  Discharge Instructions: Discharge Instructions    Call MD for:  difficulty breathing, headache or visual disturbances    Complete by:  As directed    Call MD for:  extreme fatigue    Complete by:  As directed    Call MD for:  persistant dizziness or light-headedness    Complete by:  As directed    Call  MD for:  persistant nausea and vomiting    Complete by:  As directed    Call MD for:  severe uncontrolled pain    Complete by:  As directed    Call MD for:  temperature >100.4    Complete by:  As directed    Diet - low sodium heart healthy    Complete by:  As directed    Increase activity slowly    Complete by:  As directed      Allergies as of 05/21/2017      Reactions   Peanut-containing Drug Products Anaphylaxis   Peanut Oil Only.  Can eat peanuts with no problems      Medication List    STOP  taking these medications   cyclobenzaprine 10 MG tablet Commonly known as:  FLEXERIL   ibuprofen 800 MG tablet Commonly known as:  ADVIL,MOTRIN   metFORMIN 500 MG tablet Commonly known as:  GLUCOPHAGE   ondansetron 4 MG disintegrating tablet Commonly known as:  ZOFRAN ODT     TAKE these medications   albuterol 108 (90 Base) MCG/ACT inhaler Commonly known as:  PROVENTIL HFA;VENTOLIN HFA Inhale 2 puffs into the lungs every 6 (six) hours as needed for shortness of breath.   atenolol 50 MG tablet Commonly known as:  TENORMIN Take 100 mg by mouth 2 (two) times daily.   cloNIDine 0.3 MG tablet Commonly known as:  CATAPRES Take 0.3 mg by mouth 2 (two) times daily.   diazepam 2 MG tablet Commonly known as:  VALIUM Take 1 tablet (2 mg total) by mouth every 8 (eight) hours as needed for muscle spasms.   gabapentin 300 MG capsule Commonly known as:  NEURONTIN Take 1 tablet by mouth at bedtime.   linagliptin 5 MG Tabs tablet Commonly known as:  TRADJENTA Take 1 tablet (5 mg total) by mouth daily.   montelukast 10 MG tablet Commonly known as:  SINGULAIR Take 10 mg by mouth daily.   omeprazole 20 MG capsule Commonly known as:  PRILOSEC Take 40 mg by mouth every evening.   oxyCODONE-acetaminophen 5-325 MG tablet Commonly known as:  ROXICET Take 1 tablet by mouth every 8 (eight) hours as needed for severe pain. What changed:  when to take this   PARoxetine 20 MG tablet Commonly known as:  PAXIL Take 20 mg by mouth daily.   QUEtiapine 300 MG tablet Commonly known as:  SEROQUEL Take 600 mg by mouth at bedtime.      Follow-up Information    Placey, Audrea Muscat, NP. Schedule an appointment as soon as possible for a visit in 2 week(s).   Why:  Hospital follow-up Contact information: [address] [place] [address] [phone]          Allergies  Allergen Reactions  . Peanut-Containing Drug Products Anaphylaxis    Peanut Oil Only.  Can eat peanuts with no  problems   Current Discharge Medication List    START taking these medications   Details  linagliptin (TRADJENTA) 5 MG TABS tablet Take 1 tablet (5 mg total) by mouth daily. Qty: 30 tablet, Refills: 0      CONTINUE these medications which have CHANGED   Details  oxyCODONE-acetaminophen (ROXICET) 5-325 MG tablet Take 1 tablet by mouth every 8 (eight) hours as needed for severe pain. Qty: 20 tablet, Refills: 0      CONTINUE these medications which have NOT CHANGED   Details  albuterol (PROVENTIL HFA;VENTOLIN HFA) 108 (90 Base) MCG/ACT inhaler Inhale  2 puffs into the lungs every 6 (six) hours as needed for shortness of breath. Qty: 2 Inhaler, Refills: 0    atenolol (TENORMIN) 50 MG tablet Take 100 mg by mouth 2 (two) times daily.     cloNIDine (CATAPRES) 0.3 MG tablet Take 0.3 mg by mouth 2 (two) times daily.    diazepam (VALIUM) 2 MG tablet Take 1 tablet (2 mg total) by mouth every 8 (eight) hours as needed for muscle spasms. Qty: 15 tablet, Refills: 0    montelukast (SINGULAIR) 10 MG tablet Take 10 mg by mouth daily.    omeprazole (PRILOSEC) 20 MG capsule Take 40 mg by mouth every evening.     PARoxetine (PAXIL) 20 MG tablet Take 20 mg by mouth daily.     QUEtiapine (SEROQUEL) 300 MG tablet Take 600 mg by mouth at bedtime.     gabapentin (NEURONTIN) 300 MG capsule Take 1 tablet by mouth at bedtime.      STOP taking these medications     cyclobenzaprine (FLEXERIL) 10 MG tablet      ibuprofen (ADVIL,MOTRIN) 800 MG tablet      metFORMIN (GLUCOPHAGE) 500 MG tablet      ondansetron (ZOFRAN ODT) 4 MG disintegrating tablet         Procedures/Studies: US Renal  Result Date: 05/20/2017 CLINICAL DATA:  46 year old female with history of acute renal failure. EXAM: RENAL / URINARY TRACT ULTRASOUND COMPLETE COMPARISON:  No priors. FINDINGS: Right Kidney: Length: 11.1 cm. Echogenicity within normal limits. No mass or hydronephrosis visualized. Left Kidney: Length: 11.5 cm.  Echogenicity within normal limits. No mass or hydronephrosis visualized. Bladder: Appears normal for degree of bladder distention. IMPRESSION: 1. Normal urinary tract ultrasound. Electronically Signed   By: Vinnie Langton M.D.   On: 05/20/2017 15:40      Subjective: The patient is eating and drinking well.  No complaints.  Discharge Exam: Vitals:   05/20/17 2148 05/21/17 0515  BP: 107/66 109/63  Pulse: 74 74  Resp: 20 16  Temp: 98.3 F (36.8 C) 98.1 F (36.7 C)  SpO2: 96% 96%   Vitals:   05/20/17 1020 05/20/17 2041 05/20/17 2148 05/21/17 0515  BP: 110/83  107/66 109/63  Pulse: 80  74 74  Resp: 20  20 16   Temp: 98 F (36.7 C)  98.3 F (36.8 C) 98.1 F (36.7 C)  TempSrc: Oral  Oral Oral  SpO2: 100% 93% 96% 96%  Weight:      Height:       General: Pt is alert, awake, not in acute distress Cardiovascular: normal S1/S2 +, no rubs, no gallops Respiratory: CTA bilaterally, no wheezing, no rhonchi Abdominal: Soft, NT, ND, bowel sounds + Extremities:  no cyanosis   The results of significant diagnostics from this hospitalization (including imaging, microbiology, ancillary and laboratory) are listed below for reference.     Microbiology: No results found for this or any previous visit (from the past 240 hour(s)).   Labs: BNP (last 3 results) No results for input(s): BNP in the last 8760 hours. Basic Metabolic Panel:  Recent Labs Lab 05/19/17 1136 05/20/17 0836 05/20/17 1214 05/20/17 1538 05/21/17 0451  NA 142 139 141  --  138  K 3.0* 4.1 3.9  --  3.3*  CL 106 106 110  --  107  CO2 23 22  --   --  23  GLUCOSE 137* 134* 129*  --  138*  BUN 16 31* 33*  --  32*  CREATININE 1.00 2.62* 2.80*  2.96* 1.63*  CALCIUM 9.7 9.6  --   --  8.7*   Liver Function Tests: No results for input(s): AST, ALT, ALKPHOS, BILITOT, PROT, ALBUMIN in the last 168 hours. No results for input(s): LIPASE, AMYLASE in the last 168 hours. No results for input(s): AMMONIA in the last 168  hours. CBC:  Recent Labs Lab 05/19/17 1136 05/20/17 1214 05/20/17 1538  WBC 9.1  --  8.2  NEUTROABS 6.2  --   --   HGB 12.9 13.3 11.6*  HCT 40.5 39.0 36.5  MCV 80.5  --  81.5  PLT 352  --  307   Cardiac Enzymes: No results for input(s): CKTOTAL, CKMB, CKMBINDEX, TROPONINI in the last 168 hours. BNP: Invalid input(s): POCBNP CBG:  Recent Labs Lab 05/20/17 0816  GLUCAP 147*   D-Dimer No results for input(s): DDIMER in the last 72 hours. Hgb A1c No results for input(s): HGBA1C in the last 72 hours. Lipid Profile No results for input(s): CHOL, HDL, LDLCALC, TRIG, CHOLHDL, LDLDIRECT in the last 72 hours. Thyroid function studies No results for input(s): TSH, T4TOTAL, T3FREE, THYROIDAB in the last 72 hours.  Invalid input(s): FREET3 Anemia work up No results for input(s): VITAMINB12, FOLATE, FERRITIN, TIBC, IRON, RETICCTPCT in the last 72 hours. Urinalysis    Component Value Date/Time   COLORURINE AMBER (A) 05/19/2017 1105   APPEARANCEUR CLOUDY (A) 05/19/2017 1105   LABSPEC 1.030 05/19/2017 1105   PHURINE 5.0 05/19/2017 1105   GLUCOSEU NEGATIVE 05/19/2017 1105   HGBUR NEGATIVE 05/19/2017 1105   BILIRUBINUR NEGATIVE 05/19/2017 1105   KETONESUR 20 (A) 05/19/2017 1105   PROTEINUR 100 (A) 05/19/2017 1105   UROBILINOGEN 0.2 03/30/2015 0040   NITRITE NEGATIVE 05/19/2017 1105   LEUKOCYTESUR NEGATIVE 05/19/2017 1105   Sepsis Labs Invalid input(s): PROCALCITONIN,  WBC,  LACTICIDVEN Microbiology No results found for this or any previous visit (from the past 240 hour(s)).  Time coordinating discharge: 32 mins  SIGNED:  Irwin Brakeman, MD  [facility] 05/21/2017, 9:26 AM Pager [phone]  If 7PM-7AM, please contact night-coverage [email or web address] Password [email or web address]

## 2017-05-21 NOTE — Progress Notes (Addendum)
Patient ID: Patricia [name], female   DOB: [birth year], 46 y.o.   MRN: [id number]  46 year old female presents to [facility] with complaints of depressive symptoms like decreased appetite and lethargy, confusion, helplessness, grief and financial stressors. Pt ran out of her medications and reports taking "other peoples medication and ibuprofen every couple of hours for days." Speech is pressured and rapid. Pt behavior is impulsive, labile and exaggerated. Has multiple episodes of "exhaustion and dizziness" per patient. Pt reports that over the past two weeks she has been unable to eat, has had periods of confusion and feels like the death of her uncle "three years ago just hit me now. I guess maybe I never really let it hit me."   Pt states that she is the "baby" of the family, has been on disability for years and wishes that her sister would be her healthcare power of attorney. Pt currently has a living will and the document is with her sister. Pt is ambivalent about admission. Reports that she "wants to be more independent" but then states "I don't want to be alone, I'm so scared." Reports that she is unable to afford her medications currently and stopped going to [facility] because "my counselor left and I don't like change." Pt reports that she was released from prison in 2013.   Reports past medical and psychiatric history to include chronic bronchitis, hypertension, acid reflux, migraines, chronic back pain, menopause, depression, bipolar and "borderline schizophrenic." Pt creatine clearance is high due to her taking too much prescribed medication, see Notes. Pt reports taking Percocet and Valium as prescribed, verified this by recently time marked bottles brought in during belongings search, see pt physical chart. Will pass on to am nurse.   Pt's safety ensured with 15 minute and environmental checks. Pt currently denies SI/HI and A/V hallucinations. Pt verbally agrees to seek staff if SI/HI or A/VH occurs and  to consult with staff before acting on any harmful thoughts. Consents signed, skin/belongings search completed and pt oriented to unit. Pt stable at this time. Pt given the opportunity to express concerns and ask questions. Pt given toiletries. Pt given medications, reports dark red blood after using restroom tonight. Per provider, will give colace twice a day. Pt also given a toilet hat and encouraged to soak in lukewarm water to ease pain. Pt given and educated on wheelchair use and placed on High Fall Risk precautions. Will continue to monitor.

## 2017-05-21 NOTE — Progress Notes (Signed)
Patient discharged to [facility].  IVC papers and EMTALA sent with sheriff,  Belongings and medications sent with patient. IV removed this afternoon - WNL.  Report called to [facility] at  [facility].

## 2017-05-21 NOTE — Discharge Instructions (Signed)
Follow with Primary MD  Placey, Audrea Muscat, NP  and other consultant's as instructed your Hospitalist MD  Please get a complete blood count and chemistry panel checked by your Primary MD at your next visit, and again as instructed by your Primary MD.  Get Medicines reviewed and adjusted: Please take all your medications with you for your next visit with your Primary MD  Laboratory/radiological data: Please request your Primary MD to go over all hospital tests and procedure/radiological results at the follow up, please ask your Primary MD to get all Hospital records sent to his/her office.  In some cases, they will be blood work, cultures and biopsy results pending at the time of your discharge. Please request that your primary care M.D. follows up on these results.  Also Note the following: If you experience worsening of your admission symptoms, develop shortness of breath, life threatening emergency, suicidal or homicidal thoughts you must seek medical attention immediately by calling 911 or calling your MD immediately  if symptoms less severe.  You must read complete instructions/literature along with all the possible adverse reactions/side effects for all the Medicines you take and that have been prescribed to you. Take any new Medicines after you have completely understood and accpet all the possible adverse reactions/side effects.   Do not drive when taking Pain medications or sleeping medications (Benzodaizepines)  Do not take more than prescribed Pain, Sleep and Anxiety Medications. It is not advisable to combine anxiety,sleep and pain medications without talking with your primary care practitioner  Special Instructions: If you have smoked or chewed Tobacco  in the last 2 yrs please stop smoking, stop any regular Alcohol  and or any Recreational drug use.  Wear Seat belts while driving.  Please note: You were cared for by a hospitalist during your hospital stay. Once you are  discharged, your primary care physician will handle any further medical issues. Please note that NO REFILLS for any discharge medications will be authorized once you are discharged, as it is imperative that you return to your primary care physician (or establish a relationship with a primary care physician if you do not have one) for your post hospital discharge needs so that they can reassess your need for medications and monitor your lab values.

## 2017-05-21 NOTE — Progress Notes (Signed)
Home meds picked up from pharmacy to be transported with patient to [facility]

## 2017-05-21 NOTE — Care Management (Signed)
CM spoke with Jolan, [place] disposition at [facility] to notify her pt medically cleared. Her med that had been assigned yesterday is still hers. They are ready for her to come. RN and AP CSW made aware.

## 2017-05-21 NOTE — Progress Notes (Signed)
Called report to [place] at [facility].

## 2017-05-21 NOTE — Clinical Social Work Note (Addendum)
Patient is currently under IVC per [facility] disposition. LCSW spoke with patient who was concerned about going to [facility] and not being able to get back to [facility]. She was also concerned that her long term boyfriend did not know what was going on with her. Patient wanted to call her boyfriend. LCSW facilitated phone call to patient' boyfriend on speaker phone. Patient's boyfriend was very encouraging and supportive to patient.   Patentt does not want to go to [facility] but she is agreeable to go because she realizes she needs to do better.   Ori Kreiter, Clydene Pugh, LCSW

## 2017-05-22 DIAGNOSIS — R4586 Emotional lability: Secondary | ICD-10-CM

## 2017-05-22 DIAGNOSIS — R5383 Other fatigue: Secondary | ICD-10-CM

## 2017-05-22 DIAGNOSIS — G4700 Insomnia, unspecified: Secondary | ICD-10-CM

## 2017-05-22 DIAGNOSIS — F1210 Cannabis abuse, uncomplicated: Secondary | ICD-10-CM

## 2017-05-22 DIAGNOSIS — R4582 Worries: Secondary | ICD-10-CM

## 2017-05-22 DIAGNOSIS — R440 Auditory hallucinations: Secondary | ICD-10-CM

## 2017-05-22 DIAGNOSIS — R5381 Other malaise: Secondary | ICD-10-CM

## 2017-05-22 DIAGNOSIS — N179 Acute kidney failure, unspecified: Secondary | ICD-10-CM

## 2017-05-22 DIAGNOSIS — Z6841 Body Mass Index (BMI) 40.0 and over, adult: Secondary | ICD-10-CM

## 2017-05-22 DIAGNOSIS — F1310 Sedative, hypnotic or anxiolytic abuse, uncomplicated: Secondary | ICD-10-CM

## 2017-05-22 DIAGNOSIS — F419 Anxiety disorder, unspecified: Secondary | ICD-10-CM

## 2017-05-22 DIAGNOSIS — F209 Schizophrenia, unspecified: Principal | ICD-10-CM

## 2017-05-22 DIAGNOSIS — E669 Obesity, unspecified: Secondary | ICD-10-CM

## 2017-05-22 DIAGNOSIS — F1410 Cocaine abuse, uncomplicated: Secondary | ICD-10-CM

## 2017-05-22 DIAGNOSIS — R450 Nervousness: Secondary | ICD-10-CM

## 2017-05-22 LAB — GLUCOSE, CAPILLARY: Glucose-Capillary: 104 mg/dL — ABNORMAL HIGH (ref 65–99)

## 2017-05-22 MED ORDER — LINAGLIPTIN 5 MG PO TABS
5.0000 mg | ORAL_TABLET | Freq: Every day | ORAL | Status: DC
  Administered 2017-05-22 – 2017-05-29 (×8): 5 mg via ORAL
  Filled 2017-05-22 (×10): qty 1

## 2017-05-22 MED ORDER — QUETIAPINE FUMARATE 400 MG PO TABS
700.0000 mg | ORAL_TABLET | Freq: Every day | ORAL | Status: DC
  Administered 2017-05-22 – 2017-05-27 (×6): 700 mg via ORAL
  Filled 2017-05-22 (×8): qty 1

## 2017-05-22 MED ORDER — PHENYLEPH-SHARK LIV OIL-MO-PET 0.25-3-14-71.9 % RE OINT
TOPICAL_OINTMENT | Freq: Two times a day (BID) | RECTAL | Status: DC | PRN
  Administered 2017-05-22 – 2017-05-23 (×3): via RECTAL
  Filled 2017-05-22: qty 28.4

## 2017-05-22 NOTE — Tx Team (Signed)
Interdisciplinary Treatment and Diagnostic Plan Update  05/22/2017 Time of Session: 10:17 AM  Patricia [name] MRN: [id number]  Principal Diagnosis: SCHIZOPHRENIA Secondary Diagnoses: Active Problems:   Schizophrenia ([place])   Current Medications:  Current Facility-Administered Medications  Medication Dose Route Frequency Provider Last Rate Last Dose  . acetaminophen (TYLENOL) tablet 650 mg  650 mg Oral Q6H PRN Okonkwo, Justina A, NP   650 mg at 05/22/17 0630  . alum & mag hydroxide-simeth (MAALOX/MYLANTA) 200-200-20 MG/5ML suspension 30 mL  30 mL Oral Q4H PRN Okonkwo, Justina A, NP      . atenolol (TENORMIN) tablet 100 mg  100 mg Oral BID Okonkwo, Justina A, NP   100 mg at 05/22/17 0909  . cloNIDine (CATAPRES) tablet 0.3 mg  0.3 mg Oral BID Okonkwo, Justina A, NP   0.3 mg at 05/22/17 0908  . docusate sodium (COLACE) capsule 100 mg  100 mg Oral BID PRN Laverle Hobby, PA-C      . gabapentin (NEURONTIN) capsule 300 mg  300 mg Oral QHS Okonkwo, Justina A, NP   300 mg at 05/21/17 2213  . hydrOXYzine (ATARAX/VISTARIL) tablet 25 mg  25 mg Oral TID PRN Lu Duffel, Justina A, NP      . methocarbamol (ROBAXIN) tablet 500 mg  500 mg Oral Q6H PRN Patriciaann Clan E, PA-C   500 mg at 05/22/17 0630  . montelukast (SINGULAIR) tablet 10 mg  10 mg Oral Daily Okonkwo, Justina A, NP   10 mg at 05/22/17 0908  . pantoprazole (PROTONIX) EC tablet 40 mg  40 mg Oral Daily Okonkwo, Justina A, NP   40 mg at 05/22/17 0909  . PARoxetine (PAXIL) tablet 20 mg  20 mg Oral Daily Okonkwo, Justina A, NP   20 mg at 05/22/17 0909  . QUEtiapine (SEROQUEL) tablet 600 mg  600 mg Oral QHS Okonkwo, Justina A, NP   600 mg at 05/21/17 2213  . traZODone (DESYREL) tablet 50 mg  50 mg Oral QHS PRN Lu Duffel, Justina A, NP   50 mg at 05/21/17 2213    PTA Medications: Prescriptions Prior to Admission  Medication Sig Dispense Refill Last Dose  . albuterol (PROVENTIL HFA;VENTOLIN HFA) 108 (90 Base) MCG/ACT inhaler Inhale 2 puffs into  the lungs every 6 (six) hours as needed for shortness of breath. 2 Inhaler 0 PRN at PRN  . atenolol (TENORMIN) 50 MG tablet Take 100 mg by mouth 2 (two) times daily.    Past Month at Unknown time  . cloNIDine (CATAPRES) 0.3 MG tablet Take 0.3 mg by mouth 2 (two) times daily.   Past Month at Unknown time  . diazepam (VALIUM) 2 MG tablet Take 1 tablet (2 mg total) by mouth every 8 (eight) hours as needed for muscle spasms. 15 tablet 0 05/18/2017 at Unknown time  . gabapentin (NEURONTIN) 300 MG capsule Take 1 tablet by mouth at bedtime.   03/15/2017 at 2000  . linagliptin (TRADJENTA) 5 MG TABS tablet Take 1 tablet (5 mg total) by mouth daily. 30 tablet 0   . montelukast (SINGULAIR) 10 MG tablet Take 10 mg by mouth daily.   Past Month at Unknown time  . omeprazole (PRILOSEC) 20 MG capsule Take 40 mg by mouth every evening.    Past Month at Unknown time  . oxyCODONE-acetaminophen (ROXICET) 5-325 MG tablet Take 1 tablet by mouth every 8 (eight) hours as needed for severe pain. 20 tablet 0   . PARoxetine (PAXIL) 20 MG tablet Take 20 mg by mouth daily.  Past Month at Unknown time  . QUEtiapine (SEROQUEL) 300 MG tablet Take 600 mg by mouth at bedtime.    Past Month at Unknown time    Treatment Modalities: Medication Management, Group therapy, Case management,  1 to 1 session with clinician, Psychoeducation, Recreational therapy.  Patient Stressors: Financial difficulties Health problems Loss of her uncle three years ago - grief Medication change or noncompliance  Patient Strengths: Ability for insight Active sense of humor Average or above average intelligence Communication skills General fund of knowledge Motivation for treatment/growth Supportive family/friends   Physician Treatment Plan for Primary Diagnosis: SCHIZOPHRENIA Long Term Goal(s): Improvement in symptoms so as ready for discharge  Short Term Goals:    Medication Management: Evaluate patient's response, side effects, and  tolerance of medication regimen.  Therapeutic Interventions: 1 to 1 sessions, Unit Group sessions and Medication administration.  Evaluation of Outcomes: Progressing  Physician Treatment Plan for Secondary Diagnosis: Active Problems:   Schizophrenia ([place])  Long Term Goal(s): Improvement in symptoms so as ready for discharge  Short Term Goals:    Medication Management: Evaluate patient's response, side effects, and tolerance of medication regimen.  Therapeutic Interventions: 1 to 1 sessions, Unit Group sessions and Medication administration.  Evaluation of Outcomes: Progressing   RN Treatment Plan for Primary Diagnosis: SCHIZOPHRENIA Long Term Goal(s): Knowledge of disease and therapeutic regimen to maintain health will improve  Short Term Goals: Ability to participate in decision making will improve, Ability to disclose and discuss suicidal ideas, Ability to identify and develop effective coping behaviors will improve and Compliance with prescribed medications will improve  Medication Management: RN will administer medications as ordered by provider, will assess and evaluate patient's response and provide education to patient for prescribed medication. RN will report any adverse and/or side effects to prescribing provider.  Therapeutic Interventions: 1 on 1 counseling sessions, Psychoeducation, Medication administration, Evaluate responses to treatment, Monitor vital signs and CBGs as ordered, Perform/monitor CIWA, COWS, AIMS and Fall Risk screenings as ordered, Perform wound care treatments as ordered.  Evaluation of Outcomes: Progressing   LCSW Treatment Plan for Primary Diagnosis: SCHIZOPHRENIA Long Term Goal(s): Safe transition to appropriate next level of care at discharge, Engage patient in therapeutic group addressing interpersonal concerns.  Short Term Goals: Engage patient in aftercare planning with referrals and resources, Increase emotional regulation, Facilitate  acceptance of mental health diagnosis and concerns, Identify triggers associated with mental health/substance abuse issues and Increase skills for wellness and recovery  Therapeutic Interventions: Assess for all discharge needs, 1 to 1 time with Social worker, Explore available resources and support systems, Assess for adequacy in community support network, Educate family and significant other(s) on suicide prevention, Complete Psychosocial Assessment, Interpersonal group therapy.  Evaluation of Outcomes: Progressing   Progress in Treatment: Attending groups: Yes Participating in groups: Yes Taking medication as prescribed: Yes Toleration of medication: Yes, no side effects reported at this time Family/Significant other contact made: Otilio Carpen [phone] (Long-term boyfriend of 5 years) Patient understands diagnosis: No, limited insight  Discussing patient identified problems/goals with staff: Yes Medical problems stabilized or resolved: Yes Denies suicidal/homicidal ideation: Yes Issues/concerns per patient self-inventory: None Other: N/A  New problem(s) identified: None identified at this time.   New Short Term/Long Term Goal(s): Pt states that she wants help with "Getting my disability, to stop crying, and getting my life back".   Discharge Plan or Barriers:  Upon discharge pt will return home with her boyfriend in [place] at the [facility]  Reason for Continuation of Hospitalization: Anxiety Depression Medical Issues Medication stabilization Suicidal ideation   Estimated Length of Stay: 05/27/17   Attendees: Patient: Patricia [name] 05/22/2017  10:17 AM  Physician: Maris Berger, MD 05/22/2017  10:17 AM  Nursing: Jonette Mate, RN 05/22/2017  10:17 AM  RN Care Manager: Lars Pinks, RN 05/22/2017  10:17 AM  Social Worker: Ripley Fraise, LCSW; Verdis Frederickson, Social Work Intern 05/22/2017  10:17 AM  Recreational Therapist: Victorino Sparrow,  LRT 05/22/2017  10:17 AM  Other: Norberto Sorenson, Watson 05/22/2017  10:17 AM  Other:  05/22/2017  10:17 AM  Other: 05/22/2017  10:17 AM    Scribe for Treatment Team: Darleen Crocker, Student-Social Work 05/22/2017 10:17 AM

## 2017-05-22 NOTE — Progress Notes (Signed)
  DATA ACTION RESPONSE  Objective- Pt. is visible in the dayroom, seen interacting with staff. Presents with an anxious/tearful/depressed     affect and mood.Pt c/o of anxiety, insomnia, and leg spasms this evening. Pt appears manipulative and preoccupied with various somatic c/o's.  Subjective- Denies having any SI/HI/Pain at this time. Endorses AVH stating "I see and hear random things I don't understand". Remains safe on the unit.  1:1 interaction in private to establish rapport. Encouragement, education, & support given from staff.  PRN Vistaril and Robaxin requested and will re-eval accordingly.   Safety maintained with Q 15 checks. Continue with POC.
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
## 2017-05-22 NOTE — Progress Notes (Signed)
Recreation Therapy Notes  Date: 05/22/17 Time: 1000 Location: 500 Hall Dayroom  Group Topic: Life Goals, Goal Setting  Goal Area(s) Addresses:  Patient will be able to identify at least 3 life goals.  Patient will be able to identify benefit of investing in life goals.  Patient will be able to identify benefit of setting life goals.   Intervention: Goal Planning Worksheet  Activity: Goal Planning.  Patients were given a worksheet in which they had to come up with a goal for next week, next month, next year and in five years.  Patients were to then identify the obstacles to reaching those goals, what they need to achieve their goals and what they can begin doing to work towards their goals.  Education: Discharge Planning, Coping Skills, Life Goals  Education Outcome: Acknowledges Education/In Group Clarification Provided/Needs Additional Education  Clinical Observations:  Pt did not attend group.   Victorino Sparrow, LRT/CTRS         Victorino Sparrow A 05/22/2017 11:49 AM

## 2017-05-22 NOTE — Progress Notes (Signed)
Dar Note: Patient presents with anxious affect and mood.  Appears irritable and labile during assessment.  Medication given after several encouragements.  Patient remained in her bed most of this shift.  Patient continues to reports lots of somatic complaints.  Support and encouragement offered as needed.  Routine safety checks maintained.  Patient using wheel chair on the unit with assistance.  Patient encouraged to be visible in milieu for activities and therapy but refused.

## 2017-05-22 NOTE — Progress Notes (Signed)
Patient attended group and said that his day was a 6.
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
## 2017-05-22 NOTE — BHH Group Notes (Signed)
LCSW Group Therapy 05/22/2017 1:15pm  Type of Therapy and Topic:  Group Therapy:  Change and Accountability  Participation Level:  Did Not Attend  Description of Group In this group, patients discussed power and accountability for change.  The group identified the challenges related to accountability and the difficulty of accepting the outcomes of negative behaviors.  Patients were encouraged to openly discuss a challenge/change they could take responsibility for.  Patients discussed the use of "change talk" and positive thinking as ways to support achievement of personal goals.  The group discussed ways to give support and empowerment to peers.  Therapeutic Goals: 1. Patients will state the relationship between personal power and accountability in the change process 2. Patients will identify the positive and negative consequences of a personal choice they have made 3. Patients will identify one challenge/choice they will take responsibility for making 4. Patients will discuss the role of "change talk" and the impact of positive thinking as it supports successful personal change 5. Patients will verbalize support and affirmation of change efforts in peers  Summary of Patient Progress:    Therapeutic Modalities Solution Focused Brief Therapy Motivational Interviewing Cognitive Behavioral Therapy  Riley Lam Work 05/22/2017 1:15 PM

## 2017-05-22 NOTE — Progress Notes (Signed)
Nutrition Brief Note  Patient identified on the Malnutrition Screening Tool (MST) Report  Pt with weight gain. PO intakes were between 90-100% at [facility].  Wt Readings from Last 15 Encounters:  05/21/17 272 lb (123.4 kg)  05/19/17 275 lb (124.7 kg)  05/04/17 270 lb (122.5 kg)  04/09/17 250 lb (113.4 kg)  07/07/16 250 lb (113.4 kg)  04/25/16 255 lb (115.7 kg)  04/07/16 260 lb (117.9 kg)  03/30/16 260 lb 2.3 oz (118 kg)  01/18/16 258 lb (117 kg)  08/27/15 250 lb (113.4 kg)  03/19/15 247 lb (112 kg)  01/17/15 250 lb (113.4 kg)  11/29/14 258 lb (117 kg)  11/19/14 248 lb (112.5 kg)  09/07/14 250 lb 3.2 oz (113.5 kg)    Body mass index is 46.69 kg/m. Patient meets criteria for morbid obesity based on current BMI.  Labs and medications reviewed.   No nutrition interventions warranted at this time. If nutrition issues arise, please consult RD.   Clayton Bibles, MS, RD, [place] Dietitian Pager: [phone] After Hours Pager: [phone]

## 2017-05-22 NOTE — Plan of Care (Signed)
Problem: Activity: Goal: Interest or engagement in activities will improve Outcome: Progressing Pt participated in wrap-up group this evening.

## 2017-05-22 NOTE — BHH Suicide Risk Assessment (Signed)
[facility] Admission Suicide Risk Assessment   Nursing information obtained from:    Demographic factors:    Current Mental Status:    Loss Factors:    Historical Factors:    Risk Reduction Factors:     Total Time spent with patient: 30 minutes Principal Problem: depression and psychosis Diagnosis:   Patient Active Problem List   Diagnosis Date Noted  . Schizophrenia ([place]) [F20.9] 05/21/2017  . Acute renal failure (ARF) ([place]) [N17.9] 05/20/2017  . SBO (small bowel obstruction) ([place]) [K56.609] 03/28/2016  . Small bowel obstruction ([place]) [L93.790] 03/28/2016  . Morbid obesity ([place]) [E66.01] 07/22/2014  . 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 Pain in the chest [R07.9]   . Unstable angina ([place]) [I20.0] 11/29/2013  . Obstructive sleep apnea [G47.33] 04/05/2008  . MIGRAINE HEADACHE [G43.909] 04/05/2008  . Diabetes mellitus type 2 in obese ([place]) [E11.69, E66.9] 03/21/2008  . BIPOLAR DISORDER UNSPECIFIED [F31.9] 03/21/2008  . Anxiety state [F41.1] 03/21/2008  . DEPRESSION [F32.9] 03/21/2008  . BRONCHITIS, CHRONIC [J42] 03/21/2008  . Asthma [J45.909] 03/21/2008   Subjective Data:   Patricia [name] is a 46 y/o F with history of schizophrenia and bipolar disorder who presented to [facility] via emergency services after she called reporting worsening depression and SI without plan. Pt reports feeling neglected and that nobody cares for her. She explains that she had been following up at [facility], but her provider left and she did not continue after that. She also cites stressor of instability of her housing situation as she will sometimes stay with her boyfriend, sometimes stay at a motel, and sometimes stay with family. She also reports struggling with unresolved grief from the death of her uncle (whom she viewed as a father figure) 8 years ago, and never taking time to grieve. Pt reports depressive symptoms of anhedonia, amotivation, worthlessness, hopelessness, low  energy, poor concentration, and psychomotor retardation. She endorses SI without specific plan. She endorses AH which she cannot understand. She denies VH and HI. She notes that her sleep has been poor unless she takes seroquel. She has remained adherent to her medications, but feels they should be adjusted to help her recent mood difficulties.She would like to remain on her current regimen, and we discussed increasing dose of seroquel to address ongoing mood and psychotic symptoms. Pt was in agreement and she had no further questions, comments, or concerns.   Continued Clinical Symptoms:  Alcohol Use Disorder Identification Test Final Score (AUDIT): 1 The "Alcohol Use Disorders Identification Test", Guidelines for Use in Primary Care, Second Edition.  World Pharmacologist [facility]). Score between 0-7:  no or low risk or alcohol related problems. Score between 8-15:  moderate risk of alcohol related problems. Score between 16-19:  high risk of alcohol related problems. Score 20 or above:  warrants further diagnostic evaluation for alcohol dependence and treatment.   CLINICAL FACTORS:   Severe Anxiety and/or Agitation Bipolar Disorder:   Mixed State Schizophrenia:   Paranoid or undifferentiated type   Musculoskeletal: Strength & Muscle Tone: within normal limits Gait & Station: unsteady, pt reports she has been using a wheelchair lately but she can walk Patient leans: N/A  Psychiatric Specialty Exam: Physical Exam  Nursing note and vitals reviewed.   Review of Systems  Constitutional: Negative for chills and fever.  Respiratory: Negative for cough.   Cardiovascular: Negative for chest pain.  Gastrointestinal: Negative for abdominal pain, heartburn, nausea and vomiting.  Neurological: Negative for dizziness.  Psychiatric/Behavioral: Positive for depression,  hallucinations, substance abuse and suicidal ideas. The patient is nervous/anxious.     Blood pressure 127/76, pulse 72,  temperature 99.1 F (37.3 C), temperature source Oral, resp. rate 18, height 5\' 4"  (1.626 m), weight 123.4 kg (272 lb), last menstrual period 07/11/2014, SpO2 100 %.Body mass index is 46.69 kg/m.  General Appearance: Casual and Fairly Groomed  Eye Contact:  Good  Speech:  Clear and Coherent and increased rate  Volume:  Normal  Mood:  Anxious, Depressed, Hopeless and Worthless  Affect:  Congruent, Labile and Tearful  Thought Process:  Goal Directed  Orientation:  Full (Time, Place, and Person)  Thought Content:  Hallucinations: Auditory  Suicidal Thoughts:  Yes.  without intent/plan  Homicidal Thoughts:  No  Memory:  Immediate;   Good Recent;   Good Remote;   Good  Judgement:  Fair  Insight:  Fair  Psychomotor Activity:  Decreased  Concentration:  Concentration: Fair  Recall:  Good  Fund of Knowledge:  Good  Language:  Fair  Akathisia:  No  Handed:    AIMS (if indicated):     Assets:  Communication Skills Desire for Improvement Housing Resilience Social Support  ADL's:  Intact  Cognition:  WNL  Sleep:  Number of Hours: 6      COGNITIVE FEATURES THAT CONTRIBUTE TO RISK:  Polarized thinking and Thought constriction (tunnel vision)    SUICIDE RISK:   Mild:  Suicidal ideation of limited frequency, intensity, duration, and specificity.  There are no identifiable plans, no associated intent, mild dysphoria and related symptoms, good self-control (both objective and subjective assessment), few other risk factors, and identifiable protective factors, including available and accessible social support.  PLAN OF CARE:  -Admit to inpatient level of care - Labs: HgbA1C, lipid panel, TSH, prolactin - Medications:  - Continue Clonidine 0.3mg  BID for HTN  - Change seroquel 600mg  qhs to seroquel 700mg  qhs for mood stabilization/psychosis  - Continue gabapentin 300mg  qhs for neuropathy  - Continue atarax 25mg  q8h prn anxiety  - Continue Tradjenta 5mg  qDay for diabetes  - Continue  Robaxin 500mg  q6h prn muscle spasm  - Continue singulair 10mg  qDay for asthma  - Continue protonix 40mg  qDay for GERD  - Continue Paxil 20mg  qDay for depression  - Continue trazodone 50mg  qhs prn insomnia -Encourage participation in groups and the therapeutic milieu - Discharge planning will be ongoing  I certify that inpatient services furnished can reasonably be expected to improve the patient's condition.   Pennelope Bracken, MD 05/22/2017, 4:49 PM

## 2017-05-23 LAB — LIPID PANEL
Cholesterol: 239 mg/dL — ABNORMAL HIGH (ref 0–200)
HDL: 47 mg/dL (ref >40)
LDL CALC: 163 mg/dL — AB (ref 0–99)
TRIGLYCERIDES: 143 mg/dL (ref <150)
Total CHOL/HDL Ratio: 5.1 RATIO
VLDL: 29 mg/dL (ref 0–40)

## 2017-05-23 LAB — HEMOGLOBIN A1C
Hgb A1c MFr Bld: 6.8 % — ABNORMAL HIGH (ref 4.8–5.6)
MEAN PLASMA GLUCOSE: 148.46 mg/dL

## 2017-05-23 LAB — TSH: TSH: 1.883 u[IU]/mL (ref 0.350–4.500)

## 2017-05-23 MED ORDER — GABAPENTIN 300 MG PO CAPS
300.0000 mg | ORAL_CAPSULE | Freq: Two times a day (BID) | ORAL | Status: DC
  Administered 2017-05-23 – 2017-05-25 (×4): 300 mg via ORAL
  Filled 2017-05-23 (×7): qty 1

## 2017-05-23 NOTE — Progress Notes (Signed)
DAR NOTE: Patient presents with anxious affect and mood.  Denies suicidal thoughts, auditory and visual hallucinations.  Described energy level as low and concentration as good.  Rates depression at 7, hopelessness at 7, and anxiety at 6.  Maintained on routine safety checks.  Medications given as prescribed.  Support and encouragement offered as needed.  Attended group and participated.  States goal for today is "finding somewhere to live when I leave from here."  Patient is safe on the unit.  continues to ambulate on the unit with wheelchair.  Patient encouraged to propel wheel chair on her own.  Preparation H and colace given for complain of constipation and hemorrhoid with good result.

## 2017-05-23 NOTE — BHH Group Notes (Signed)
[place] LCSW Group Therapy  05/23/2017  1:05 PM  Type of Therapy:  Group therapy  Participation Level:  Active  Participation Quality:  Attentive  Affect:  Flat  Cognitive:  Oriented  Insight:  Limited  Engagement in Therapy:  Limited  Modes of Intervention:  Discussion, Socialization  Summary of Progress/Problems:  Chaplain was here to lead a group on themes of hope and courage. "Everyone keeps telling me to ask ask ask, for what?  To get doors slammed in my face?  I'm pissed. But my sister-in-law pointed out to me that the hospital is here to help, so I need to try hard to work with these people.  I haven't been talking to nobody.  My uncle passed 3 years ago, but last week it felt like he was just passing now.  I haven't had anyone to talk to."  Became tearful, unable to go on. Later, "my boyfriewnds works real hard-12 hours a day.  But he doesn't make enough for me to be able to live with him.  And now my sister needs me, but I am not there for her."  [name] Mage 05/23/2017 1:32 PM

## 2017-05-23 NOTE — Progress Notes (Signed)
Recreation Therapy Notes  INPATIENT RECREATION THERAPY ASSESSMENT  Patient Details Name: Patricia [name] MRN: [id number] DOB: [birth year] Today's Date: 05/23/2017  Patient Stressors: Relationship, Death, Other (Comment) (Moving from place to place)  Pt stated she was here for physical and mental pain and thinking of hurting herself. Pt stated her uncle died 3 yrs ago.  Coping Skills:   Isolate, Arguments, Avoidance, Talking, Music  Personal Challenges: Anger, Concentration, Decision-Making, Expressing Yourself, Problem-Solving, Self-Esteem/Confidence, Stress Management, Time Management  Leisure Interests (2+):  Games - Cards, Individual - Reading, Individual - TV  Awareness of Community Resources:  Yes  [employer]:  Library, [place]  Current Use: No  If no, Barriers?: Other (Comment) (Pt didn't specify)  Patient Strengths:  Giving advise, Good friend  Patient Identified Areas of Improvement:  Anger, Stop rushing things  Current Recreation Participation:  Everyday  Patient Goal for Hospitalization:  "I don't know"  Box of Residence:  [place] Residence:  [place]  Current [place] (including self-harm):  No  Current HI:  No  Consent to Intern Participation: N/A   Victorino Sparrow, LRT/CTRS  Victorino Sparrow A 05/23/2017, 2:11 PM

## 2017-05-23 NOTE — Progress Notes (Signed)
Patient did not attend wrap up group. 

## 2017-05-23 NOTE — Progress Notes (Signed)
Recreation Therapy Notes  Date: 05/23/17 Time: 1000 Location:  [address]  Group Topic: Communication  Goal Area(s) Addresses:  Patient will effectively communicate with peers in group.  Patient will verbalize benefit of healthy communication. Patient will verbalize positive effect of healthy communication on post d/c goals.  Patient will identify communication techniques that made activity effective for group.   Intervention:  Rubber discs  Activity: Traffic Jam.  Patients were given one rubber disc each, plus one extra for the group.  Patients were to use the discs to travel as a group from one end of the hall to the other and back.  If any person stepped off their disc, the group would have to start from the beginning.    Education:Communication, Discharge Planning  Education Outcome: Acknowledges understanding/In group clarification offered/Needs additional education.   Clinical Observations/Feedback: Pt did not attend group.   Victorino Sparrow, LRT/CTRS         Victorino Sparrow A 05/23/2017 12:28 PM

## 2017-05-23 NOTE — Plan of Care (Signed)
Problem: Safety: Goal: Periods of time without injury will increase Outcome: Progressing Patient is safe and free from injury.  Routine safety checks maintained every 15 minutes.
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
## 2017-05-23 NOTE — Progress Notes (Signed)
Nursing Progress Note: 7p-7a D: Pt currently presents with a depressed/brightens affect and behavior. Pt states "I had a really good day." Interacting appropriately with the milieu. Pt reports off and on sleep during the previous night with current medication regimen. Pt did not attend wrap-up group.  A: Pt provided with medications per providers orders. Pt's labs and vitals were monitored throughout the night. Pt supported emotionally and encouraged to express concerns and questions. Pt educated on medications.  R: Pt's safety ensured with 15 minute and environmental checks. Pt currently denies SI, HI, and endorses AVH. Pt verbally contracts to seek staff if SI,HI, or AVH occurs and to consult with staff before acting on any harmful thoughts. Will continue to monitor.

## 2017-05-24 DIAGNOSIS — J45909 Unspecified asthma, uncomplicated: Secondary | ICD-10-CM

## 2017-05-24 DIAGNOSIS — Z87891 Personal history of nicotine dependence: Secondary | ICD-10-CM

## 2017-05-24 DIAGNOSIS — G43909 Migraine, unspecified, not intractable, without status migrainosus: Secondary | ICD-10-CM

## 2017-05-24 DIAGNOSIS — M62838 Other muscle spasm: Secondary | ICD-10-CM

## 2017-05-24 DIAGNOSIS — I10 Essential (primary) hypertension: Secondary | ICD-10-CM

## 2017-05-24 DIAGNOSIS — F200 Paranoid schizophrenia: Secondary | ICD-10-CM

## 2017-05-24 DIAGNOSIS — R45851 Suicidal ideations: Secondary | ICD-10-CM

## 2017-05-24 DIAGNOSIS — E119 Type 2 diabetes mellitus without complications: Secondary | ICD-10-CM

## 2017-05-24 DIAGNOSIS — K219 Gastro-esophageal reflux disease without esophagitis: Secondary | ICD-10-CM

## 2017-05-24 DIAGNOSIS — M549 Dorsalgia, unspecified: Secondary | ICD-10-CM

## 2017-05-24 LAB — GLUCOSE, CAPILLARY: Glucose-Capillary: 164 mg/dL — ABNORMAL HIGH (ref 65–99)

## 2017-05-24 LAB — PROLACTIN: Prolactin: 14.2 ng/mL (ref 4.8–23.3)

## 2017-05-24 MED ORDER — ASPIRIN-ACETAMINOPHEN-CAFFEINE 250-250-65 MG PO TABS: 2.0000 | ORAL_TABLET | Freq: Four times a day (QID) | ORAL | Status: DC | PRN

## 2017-05-24 MED ORDER — LIDOCAINE 5 % EX PTCH
2.0000 | MEDICATED_PATCH | Freq: Every day | CUTANEOUS | Status: DC
  Administered 2017-05-25 – 2017-05-29 (×5): 2 via TRANSDERMAL
  Filled 2017-05-24 (×5): qty 2
  Filled 2017-05-24: qty 14
  Filled 2017-05-24: qty 2
  Filled 2017-05-24: qty 14
  Filled 2017-05-24: qty 2

## 2017-05-24 MED ORDER — ASPIRIN-ACETAMINOPHEN-CAFFEINE 250-250-65 MG PO TABS
1.0000 | ORAL_TABLET | Freq: Three times a day (TID) | ORAL | Status: DC | PRN
  Administered 2017-05-24 – 2017-05-25 (×2): 1 via ORAL
  Filled 2017-05-24 (×2): qty 1

## 2017-05-24 MED ORDER — LIDOCAINE 5 % EX PTCH
1.0000 | MEDICATED_PATCH | Freq: Every day | CUTANEOUS | Status: DC
  Administered 2017-05-24: 1 via TRANSDERMAL
  Filled 2017-05-24 (×2): qty 1

## 2017-05-24 MED ORDER — ASPIRIN-ACETAMINOPHEN-CAFFEINE 250-250-65 MG PO TABS: 1.0000 | ORAL_TABLET | Freq: Four times a day (QID) | ORAL | Status: DC | PRN

## 2017-05-24 NOTE — Progress Notes (Signed)
Writer has observed patient up in the dayroom watching tv and interacting with select peers. She attended group and participated and was compliant with scheduled medications. She reports concern about her living situation and needing medicaid. She has been pleasant and cooperative. Safety maintained on unit with 15 min checks.

## 2017-05-24 NOTE — Progress Notes (Signed)
Patient ID: Patricia [name], female   DOB: [birth year], 46 y.o.   MRN: [id number]    D: Pt has been very flat and depressed on the unit today. Pt reported that she was having a bad day. Pt reported that she was in a lot of pain in her back, that her head was hurting, and that she was tired of being in a wheelchair. This Probation officer encouraged patient to get up and walk, she did get up and walk reported that she felt better not using the wheelchair. Aggie NP was made aware of all of patients complaints, new orders noted. Pt reported that her depression was a 9, her hopelessness was a 7, and her anxiety was a 7. Pt reported that she had no goal for today. Pt did attend all groups and engaged in treatment. Pt reported being negative SI/HI, no AH/VH noted. A: 15 min checks continued for patient safety. R: Pt safety maintained.

## 2017-05-24 NOTE — Progress Notes (Signed)
Patient attended group and said that her day was a 9.  She said she had a stress free day and watch a lot of tv.
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
## 2017-05-24 NOTE — BHH Group Notes (Signed)
LCSW Group Therapy Note  Date/Time:  05/24/2017   11:15am-12:00pm  Type of Therapy and Topic:  Group Therapy:  Fears about discharge  Participation Level:  Active   Description of Group:  The focus of this group was to discuss some of the prevalent fears that patients experience, and to identify the commonalities among group members.    Therapeutic Goals: 1. Patient will identify one positive coping strategy for each fear they experience 2. Patient will respond empathetically to peers' statements regarding fears they experience  Summary of Patient Progress:  The patient expressed that she is afraid of being homeless when she leaves the hospital, is seeking resources, and shows insight and judgment regarding her needs and safety.  Therapeutic Modalities Cognitive Behavioral Therapy Motivational Interviewing  Selmer Dominion, LCSW

## 2017-05-25 LAB — GLUCOSE, CAPILLARY
GLUCOSE-CAPILLARY: 148 mg/dL — AB (ref 65–99)
GLUCOSE-CAPILLARY: 145 mg/dL — AB (ref 65–99)
GLUCOSE-CAPILLARY: 233 mg/dL — AB (ref 65–99)
Glucose-Capillary: 118 mg/dL — ABNORMAL HIGH (ref 65–99)

## 2017-05-25 MED ORDER — SUMATRIPTAN SUCCINATE 100 MG PO TABS
100.0000 mg | ORAL_TABLET | ORAL | Status: DC | PRN
  Administered 2017-05-25 – 2017-05-27 (×3): 100 mg via ORAL
  Filled 2017-05-25: qty 2
  Filled 2017-05-25: qty 4
  Filled 2017-05-25: qty 2

## 2017-05-25 MED ORDER — GABAPENTIN 400 MG PO CAPS
400.0000 mg | ORAL_CAPSULE | Freq: Four times a day (QID) | ORAL | Status: DC
  Administered 2017-05-25 – 2017-05-29 (×17): 400 mg via ORAL
  Filled 2017-05-25 (×6): qty 1
  Filled 2017-05-25: qty 28
  Filled 2017-05-25 (×2): qty 1
  Filled 2017-05-25: qty 28
  Filled 2017-05-25 (×5): qty 1
  Filled 2017-05-25: qty 28
  Filled 2017-05-25 (×5): qty 1
  Filled 2017-05-25 (×4): qty 28
  Filled 2017-05-25 (×4): qty 1

## 2017-05-25 NOTE — Progress Notes (Signed)
Patient ID: Patricia [name], female   DOB: 27-Feb-1971, 46 y.o.   MRN: [id number]    D: Pt continues to be very flat and depressed on the unit today. Pt reported that she was having a bad day. Pt reported that she was in a lot of pain in her back, that her head was hurting, and that she was tired of being in a wheelchair. This Probation officer encouraged patient to get up and walk, she did get up and walk reported that she felt better not using the wheelchair. Aggie NP was made aware of all of patients complaints, new orders noted. Pt reported that her depression was a 7, her hopelessness was a 3, and her anxiety was a 7. Pt reported that she had no goal for today. Pt did attend all groups and engaged in treatment. Pt reported being negative SI/HI, no AH/VH noted. A: 15 min checks continued for patient safety. R: Pt safety maintained.

## 2017-05-25 NOTE — Progress Notes (Signed)
Nutrition Brief Note  RD consulted for calorie count and diet education.  Education not appropriate given reports of pt stumbling and light headedness. Pt also homeless and reliant on a wheelchair for ambulation.   Wt Readings from Last 15 Encounters:  05/21/17 272 lb (123.4 kg)  05/19/17 275 lb (124.7 kg)  05/04/17 270 lb (122.5 kg)  04/09/17 250 lb (113.4 kg)  07/07/16 250 lb (113.4 kg)  04/25/16 255 lb (115.7 kg)  04/07/16 260 lb (117.9 kg)  03/30/16 260 lb 2.3 oz (118 kg)  01/18/16 258 lb (117 kg)  08/27/15 250 lb (113.4 kg)  03/19/15 247 lb (112 kg)  01/17/15 250 lb (113.4 kg)  11/29/14 258 lb (117 kg)  11/19/14 248 lb (112.5 kg)  09/07/14 250 lb 3.2 oz (113.5 kg)    Body mass index is 46.69 kg/m. Patient meets criteria for obesity based on current BMI.  Labs and medications reviewed.   No nutrition interventions warranted at this time.   Clayton Bibles, MS, RD, [place] Dietitian Pager: [phone] After Hours Pager: [phone]
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
## 2017-05-25 NOTE — Progress Notes (Signed)
Writer spoke with patient 1:1 and she reports that some changes have been made to her medications and she is hopeful that they will work. She has been observed up in the dayroom watching tv and interacting with peers. Her blood sugar was high on tonight and Probation officer educated her on her carbohydrates and sugar intake. She reported that she had eaten a lot of things that she should not have. Support given and safety maintained on unit with 15 min checks.

## 2017-05-25 NOTE — Progress Notes (Signed)
Patient came to nursing station to request something to drink and asked for a visteril prn which she received. She was returning to her room and became light headed and was stumbling when mht on the hall assisted her to stand still and steady herself until wheelchair was brought. Vitals were taken (156/96, pulse 111) and she was assisted back to bed and wheel chair is by bedside due to her unsteadiness. Safety maintained on unit with 15 min checks.

## 2017-05-25 NOTE — BHH Group Notes (Signed)
[facility] LCSW Group Therapy Note  Date/Time:  05/25/2017  11:00AM-12:00PM  Type of Therapy and Topic:  Group Therapy:  Music and Mood  Participation Level:  Active   Description of Group: In this process group, members listened to a variety of genres of music and identified that different types of music evoke different responses.  Patients were encouraged to identify music that was soothing for them and music that was energizing for them.  Patients discussed how this knowledge can help with wellness and recovery in various ways including managing depression and anxiety as well as encouraging healthy sleep habits.    Therapeutic Goals: 1. Patients will explore the impact of different varieties of music on mood 2. Patients will verbalize the thoughts they have when listening to different types of music 3. Patients will identify music that is soothing to them as well as music that is energizing to them 4. Patients will discuss how to use this knowledge to assist in maintaining wellness and recovery 5. Patients will explore the use of music as a coping skill  Summary of Patient Progress:  At the beginning of group, patient expressed that she was in pain rated at 10 of 10.  She said it was reduced to 8 by the end of group and said she had never been considered using music to divert her attention away from her pain.  Therapeutic Modalities: Solution Focused Brief Therapy Motivational Interviewing Activity   Selmer Dominion, LCSW 05/25/2017 12:44 PM

## 2017-05-26 LAB — GLUCOSE, CAPILLARY
GLUCOSE-CAPILLARY: 166 mg/dL — AB (ref 65–99)
Glucose-Capillary: 127 mg/dL — ABNORMAL HIGH (ref 65–99)
Glucose-Capillary: 165 mg/dL — ABNORMAL HIGH (ref 65–99)
Glucose-Capillary: 132 mg/dL — ABNORMAL HIGH (ref 65–99)

## 2017-05-26 NOTE — Progress Notes (Signed)
Recreation Therapy Notes  Date: 05/26/17 Time: 1000 Location: 500 Hall Dayroom  Group Topic: Coping Skills  Goal Area(s) Addresses:  Patient will be able to identify positive coping skills. Patient will be able to identify benefits of using coping skills post d/c.  Behavioral Response: None  Intervention: Worksheet  Activity: Mind map.  Patients were given a worksheet of a blank mind map.  LRT and patients filled in the first eight boxes together with family, depression, anxiety, lack of finances, anger, abandonment issues, stress and housing.   Education: Radiographer, therapeutic, Dentist.   Education Outcome: Acknowledges understanding/In group clarification offered/Needs additional education.   Clinical Observations/Feedback: Pt came to group long enough to get introduction of activity and instructions.  Pt left and did not return.   Victorino Sparrow, LRT/CTRS         Victorino Sparrow A 05/26/2017 12:09 PM

## 2017-05-26 NOTE — Progress Notes (Signed)
Nursing Note 05/26/2017 [id number]  Data Reports sleeping good without PRN sleep med.  Rates depression 5/10, hopelessness 4/10, and anxiety 4/10. Affect ranging from sad and tearful to blunted and appropriate/bright at times. Denies HI.  Reports SI and stable AVH "it's about the same as it always is, I can hear garble but I can't understand what it means." C/O ongoing back pain, and severe anxiety this morning.  Concerned about discharge and where she is going to go.  Patient spoke with MD/social worker this afternoon "I was upset but the doctor assured me he wasn't going to throw me out on the street.  He said he would make sure I was stable before I go."   Action Spoke with patient 1:1, nurse offered support to patient throughout shift.  Received PRN's for pain/anxiety in AM.  Continues to be monitored on 15 minute checks for safety.  Response Tearful in AM but calm and pleasant in afternoon.  States back pain is unchanged despite intervention.

## 2017-05-26 NOTE — BHH Group Notes (Addendum)
LCSW Group Therapy Note   05/26/2017 1:15pm   Type of Therapy and Topic:  Group Therapy:  Overcoming Obstacles   Participation Level:  Active   Description of Group:    In this group patients will be encouraged to explore what they see as obstacles to their own wellness and recovery. They will be guided to discuss their thoughts, feelings, and behaviors related to these obstacles. The group will process together ways to cope with barriers, with attention given to specific choices patients can make. Each patient will be challenged to identify changes they are motivated to make in order to overcome their obstacles. This group will be process-oriented, with patients participating in exploration of their own experiences as well as giving and receiving support and challenge from other group members.   Therapeutic Goals: 1. Patient will identify personal and current obstacles as they relate to admission. 2. Patient will identify barriers that currently interfere with their wellness or overcoming obstacles.  3. Patient will identify feelings, thought process and behaviors related to these barriers. 4. Patient will identify two changes they are willing to make to overcome these obstacles:      Summary of Patient Progress  Began by talking about how it has been hard for her to ask for help in the past, but now that she is doing it, she is not getting the help that she needs, not from her family, not from formal supports and not from God.  Worked herself up into a tearful, crying state and ended up leaving and not returning.   Therapeutic Modalities:   Cognitive Behavioral Therapy Solution Focused Therapy Motivational Interviewing Relapse Prevention Therapy  [name] Mage, LCSW 05/26/2017 4:24 PM
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
## 2017-05-26 NOTE — Progress Notes (Signed)
Adult Psychoeducational Group Note  Date:  05/26/2017 Time:  2:21 AM  Group Topic/Focus:  Wrap-Up Group:   The focus of this group is to help patients review their daily goal of treatment and discuss progress on daily workbooks.  Participation Level:  Active  Participation Quality:  Appropriate  Affect:  Appropriate  Cognitive:  Appropriate  Insight: Appropriate  Engagement in Group:  Engaged  Modes of Intervention:  Discussion  Additional Comments:  Pt stated her goal for today was to talk with doctor about her discharged plan. Pt stated she accomplished her goal today. Pt rated her overall day and 8 out of 10. Pt stated she attended all groups held today.  Candy Sledge 05/26/2017, 2:21 AM

## 2017-05-27 LAB — GLUCOSE, CAPILLARY: Glucose-Capillary: 132 mg/dL — ABNORMAL HIGH (ref 65–99)

## 2017-05-27 MED ORDER — GUAIFENESIN ER 600 MG PO TB12
600.0000 mg | ORAL_TABLET | Freq: Two times a day (BID) | ORAL | Status: DC
  Administered 2017-05-27 – 2017-05-29 (×4): 600 mg via ORAL
  Filled 2017-05-27 (×8): qty 1

## 2017-05-27 MED ORDER — ONDANSETRON 4 MG PO TBDP
4.0000 mg | ORAL_TABLET | Freq: Two times a day (BID) | ORAL | Status: DC
  Filled 2017-05-27 (×3): qty 1

## 2017-05-27 MED ORDER — ONDANSETRON 4 MG PO TBDP: 4.0000 mg | ORAL_TABLET | Freq: Two times a day (BID) | ORAL | Status: DC | PRN

## 2017-05-27 NOTE — Progress Notes (Signed)
Adult Psychoeducational Group Note  Date:  05/27/2017 Time:  9:02 PM  Group Topic/Focus:  Wrap-Up Group:   The focus of this group is to help patients review their daily goal of treatment and discuss progress on daily workbooks.  Participation Level:  Active  Participation Quality:  Appropriate  Affect:  Appropriate  Cognitive:  Appropriate  Insight: Appropriate  Engagement in Group:  Engaged  Modes of Intervention:  Discussion  Additional Comments:  The patient expressed that she attended the medication group.The patient also said that she rates her day a 7.  Nash Shearer 05/27/2017, 9:02 PM
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
## 2017-05-27 NOTE — [facility] Group Notes (Signed)
LCSW Group Therapy Note   05/27/2017 1:15pm   Type of Therapy and Topic:  Group Therapy:  Positive Affirmations   Participation Level:  Did Not Attend  Description of Group: This group addressed positive affirmation toward self and others. Patients went around the room and identified two positive things about themselves and two positive things about a peer in the room. Patients reflected on how it felt to share something positive with others, to identify positive things about themselves, and to hear positive things from others. Patients were encouraged to have a daily reflection of positive characteristics or circumstances.  Therapeutic Goals 1. Patient will verbalize two of their positive qualities 2. Patient will demonstrate empathy for others by stating two positive qualities about a peer in the group 3. Patient will verbalize their feelings when voicing positive self affirmations and when voicing positive affirmations of others 4. Patients will discuss the potential positive impact on their wellness/recovery of focusing on positive traits of self and others. Summary of Patient Progress:    Therapeutic Modalities Cognitive [place], Turner 05/27/2017 4:30 PM

## 2017-05-27 NOTE — Progress Notes (Signed)
Nursing Note 05/27/2017 [id number]  Data Reports sleeping good without PRN sleep med.  Rates depression 8/10, hopelessness 8/10, and anxiety 8/10. Affect blunted but appropriate.  Denies HI, SI.  Ongoing stable AH, denies VH.   Patient requesting multiple PRN's throughout day for migraines and back pain but states these have been effective with pain patch to keep pain at [place].  Evening patient appeared to have a near syncopal episode at med window (see other note for more information/follow up).  Spent most of AM in room asleep,afternoon in dayroom engaging peers, and watching TV.   Action Spoke with patient 1:1, nurse offered support to patient throughout shift.  Educated on fall prevention.  Multiple PRN's throughout day for migraines/back pain.  Continues to be monitored on 15 minute checks for safety.  Response Remains safe on unit, continues to be high fall risk.

## 2017-05-27 NOTE — Progress Notes (Signed)
Patient standing at medication window 1659 after RN scanned medicines right before giving patient med cup, patient reached out for RN and said " feel funny."  Patient started lowering to floor while RN was holding on to patient for assistance, tech pushed a chair under patient.  Blood pressure was taken and 150/100 pulse 79.  Attempted to stand patient to get standing vital signs to assess for orthostasis after she sat for a few minutes, she needed to be sat down in wheelchair.  Was wheeled into dayroom and instructed not to get out of chair without assistance from staff- yellow socks in place.   NP notified, ok with plan to reassess.  Meds held for the time being, allowed patient to rest for 30 minutes and eat dinner in dayroom.  Attempted standing blood pressure again.  Standing BP was 160/110 pulse 82, patient reported minor dizziness.  Suspected behavioral incident; however, on high fall precautions with instructions to only ambulate with staff assistance for time being.  Per advice of NP Justina, given all scheduled medicines as ordered.  Will reassess vitals in 1 hour.

## 2017-05-27 NOTE — Tx Team (Signed)
Interdisciplinary Treatment and Diagnostic Plan Update  05/27/2017 Time of Session: 4:41 PM  Patricia [name] MRN: [id number]  Principal Diagnosis: SCHIZOPHRENIA Secondary Diagnoses: Principal Problem:   Schizophrenia ([place])   Current Medications:  Current Facility-Administered Medications  Medication Dose Route Frequency Provider Last Rate Last Dose  . acetaminophen (TYLENOL) tablet 650 mg  650 mg Oral Q6H PRN Okonkwo, Justina A, NP   650 mg at 05/27/17 0802  . alum & mag hydroxide-simeth (MAALOX/MYLANTA) 200-200-20 MG/5ML suspension 30 mL  30 mL Oral Q4H PRN Okonkwo, Justina A, NP      . atenolol (TENORMIN) tablet 100 mg  100 mg Oral BID Okonkwo, Justina A, NP   100 mg at 05/27/17 0802  . cloNIDine (CATAPRES) tablet 0.3 mg  0.3 mg Oral BID Okonkwo, Justina A, NP   0.3 mg at 05/27/17 0802  . docusate sodium (COLACE) capsule 100 mg  100 mg Oral BID PRN Laverle Hobby, PA-C   100 mg at 05/25/17 0736  . gabapentin (NEURONTIN) capsule 400 mg  400 mg Oral QID Lindell Spar I, NP   400 mg at 05/27/17 1210  . guaiFENesin (MUCINEX) 12 hr tablet 600 mg  600 mg Oral BID Maris Berger T, MD      . hydrOXYzine (ATARAX/VISTARIL) tablet 25 mg  25 mg Oral TID PRN Lu Duffel, Justina A, NP   25 mg at 05/26/17 1047  . lidocaine (LIDODERM) 5 % 2 patch  2 patch Transdermal Daily Lindell Spar I, NP   2 patch at 05/27/17 0759  . linagliptin (TRADJENTA) tablet 5 mg  5 mg Oral Daily Lindell Spar I, NP   5 mg at 05/27/17 0803  . methocarbamol (ROBAXIN) tablet 500 mg  500 mg Oral Q6H PRN Laverle Hobby, PA-C   500 mg at 05/27/17 1427  . montelukast (SINGULAIR) tablet 10 mg  10 mg Oral Daily Okonkwo, Justina A, NP   10 mg at 05/27/17 0803  . ondansetron (ZOFRAN-ODT) disintegrating tablet 4 mg  4 mg Oral Q12H PRN Pennelope Bracken, MD      . pantoprazole (PROTONIX) EC tablet 40 mg  40 mg Oral Daily Okonkwo, Justina A, NP   40 mg at 05/27/17 0802  . PARoxetine (PAXIL) tablet 20 mg  20 mg Oral Daily  Okonkwo, Justina A, NP   20 mg at 05/27/17 0802  . phenylephrine-shark liver oil-mineral oil-petrolatum (PREPARATION H) rectal ointment   Rectal BID PRN Pennelope Bracken, MD      . QUEtiapine (SEROQUEL) tablet 700 mg  700 mg Oral QHS Pennelope Bracken, MD   700 mg at 05/26/17 2109  . SUMAtriptan (IMITREX) tablet 100 mg  100 mg Oral Q2H PRN Lindell Spar I, NP   100 mg at 05/27/17 1209  . traZODone (DESYREL) tablet 50 mg  50 mg Oral QHS PRN Lu Duffel, Justina A, NP   50 mg at 05/26/17 2109    PTA Medications: Prescriptions Prior to Admission  Medication Sig Dispense Refill Last Dose  . albuterol (PROVENTIL HFA;VENTOLIN HFA) 108 (90 Base) MCG/ACT inhaler Inhale 2 puffs into the lungs every 6 (six) hours as needed for shortness of breath. 2 Inhaler 0 PRN at PRN  . atenolol (TENORMIN) 50 MG tablet Take 100 mg by mouth 2 (two) times daily.    Past Month at Unknown time  . cloNIDine (CATAPRES) 0.3 MG tablet Take 0.3 mg by mouth 2 (two) times daily.   Past Month at Unknown time  . diazepam (VALIUM) 2 MG tablet  Take 1 tablet (2 mg total) by mouth every 8 (eight) hours as needed for muscle spasms. 15 tablet 0 05/18/2017 at Unknown time  . gabapentin (NEURONTIN) 300 MG capsule Take 1 tablet by mouth at bedtime.   03/15/2017 at 2000  . linagliptin (TRADJENTA) 5 MG TABS tablet Take 1 tablet (5 mg total) by mouth daily. 30 tablet 0   . montelukast (SINGULAIR) 10 MG tablet Take 10 mg by mouth daily.   Past Month at Unknown time  . omeprazole (PRILOSEC) 20 MG capsule Take 40 mg by mouth every evening.    Past Month at Unknown time  . oxyCODONE-acetaminophen (ROXICET) 5-325 MG tablet Take 1 tablet by mouth every 8 (eight) hours as needed for severe pain. 20 tablet 0   . PARoxetine (PAXIL) 20 MG tablet Take 20 mg by mouth daily.    Past Month at Unknown time  . QUEtiapine (SEROQUEL) 300 MG tablet Take 600 mg by mouth at bedtime.    Past Month at Unknown time    Treatment Modalities: Medication  Management, Group therapy, Case management,  1 to 1 session with clinician, Psychoeducation, Recreational therapy.  Patient Stressors: Financial difficulties Health problems Loss of her uncle three years ago - grief Medication change or noncompliance  Patient Strengths: Ability for insight Active sense of humor Average or above average intelligence Curator fund of knowledge Motivation for treatment/growth Supportive family/friends   Physician Treatment Plan for Primary Diagnosis: SCHIZOPHRENIA Long Term Goal(s): Improvement in symptoms so as ready for discharge  Short Term Goals: Ability to identify changes in lifestyle to reduce recurrence of condition will improve Ability to verbalize feelings will improve Ability to disclose and discuss suicidal ideas Ability to identify and develop effective coping behaviors will improve Compliance with prescribed medications will improve Ability to identify triggers associated with substance abuse/mental health issues will improve  Medication Management: Evaluate patient's response, side effects, and tolerance of medication regimen.  Therapeutic Interventions: 1 to 1 sessions, Unit Group sessions and Medication administration.  Evaluation of Outcomes: Progressing   10/30: . Pt reports improvement of mood and psychotic symptoms. She is anxious regarding her discharge plan as she has unstable housing. She agrees to continue her current regimen without changes.  - Continue inpatient hospitalization. -Schizophrenia - Continue Seroquel 700 mg qhs - Continue Paxil 20 mg qDay -Insomnia - Continue trazodone 50mg  qhs prn insomnia - Chronic pain Continuegabapentin from 300 mg po BID to qid. -Continue Robaxin 500 mg Q 6 hours prn. -Continue Lidoderm patch Q 24 hours prn - Anxiety - Continue atarax 25mg  q8h prn anxiety -  Diabetes type II - Continue tradjenta 5mg  qDay - Asthma - Continue singulair 10mg  qDay -GERD - Continue protonix 40mg  qDay - Hypertension - Continue clonidine 0.3mg  BID - Muscle spasm - Continue robaxin 500mg  q6h prn muscle spasm. -Migraine headaches -Start Imitrex 100 mg Q 2 hours prn.  Physician Treatment Plan for Secondary Diagnosis: Principal Problem:   Schizophrenia ([place])  Long Term Goal(s): Improvement in symptoms so as ready for discharge  Short Term Goals: Ability to identify changes in lifestyle to reduce recurrence of condition will improve Ability to verbalize feelings will improve Ability to disclose and discuss suicidal ideas Ability to identify and develop effective coping behaviors will improve Compliance with prescribed medications will improve Ability to identify triggers associated with substance abuse/mental health issues will improve  Medication Management: Evaluate patient's response, side effects, and tolerance of medication regimen.  Therapeutic Interventions: 1 to 1 sessions, Unit  Group sessions and Medication administration.  Evaluation of Outcomes: Progressing   RN Treatment Plan for Primary Diagnosis: SCHIZOPHRENIA Long Term Goal(s): Knowledge of disease and therapeutic regimen to maintain health will improve  Short Term Goals: Ability to participate in decision making will improve, Ability to disclose and discuss suicidal ideas, Ability to identify and develop effective coping behaviors will improve and Compliance with prescribed medications will improve  Medication Management: RN will administer medications as ordered by provider, will assess and evaluate patient's response and provide education to patient for prescribed medication. RN will report any adverse and/or side effects to prescribing provider.  Therapeutic Interventions: 1 on 1 counseling sessions, Psychoeducation,  Medication administration, Evaluate responses to treatment, Monitor vital signs and CBGs as ordered, Perform/monitor CIWA, COWS, AIMS and Fall Risk screenings as ordered, Perform wound care treatments as ordered.  Evaluation of Outcomes: Progressing   LCSW Treatment Plan for Primary Diagnosis: SCHIZOPHRENIA Long Term Goal(s): Safe transition to appropriate next level of care at discharge, Engage patient in therapeutic group addressing interpersonal concerns.  Short Term Goals: Engage patient in aftercare planning with referrals and resources, Increase emotional regulation, Facilitate acceptance of mental health diagnosis and concerns, Identify triggers associated with mental health/substance abuse issues and Increase skills for wellness and recovery  Therapeutic Interventions: Assess for all discharge needs, 1 to 1 time with Social worker, Explore available resources and support systems, Assess for adequacy in community support network, Educate family and significant other(s) on suicide prevention, Complete Psychosocial Assessment, Interpersonal group therapy.  Evaluation of Outcomes: Progressing   Progress in Treatment: Attending groups: Yes Participating in groups: Yes Taking medication as prescribed: Yes Toleration of medication: Yes, no side effects reported at this time Family/Significant other contact made: Otilio Carpen [phone] (Long-term boyfriend of 5 years) Patient understands diagnosis: No, limited insight  Discussing patient identified problems/goals with staff: Yes Medical problems stabilized or resolved: Yes Denies suicidal/homicidal ideation: Yes Issues/concerns per patient self-inventory: None Other: N/A  New problem(s) identified: None identified at this time.   New Short Term/Long Term Goal(s): Pt states that she wants help with "Getting my disability, to stop crying, and getting my life back".   Discharge Plan or Barriers:  Upon discharge pt will return home  with her boyfriend in [place] at the [facility]    10/30:  Pt has been stating this week that she is homeless and will not return to boyfriend.  This afternoon she found out about a homeless shelter in [place] even though she has said she would not go to a shelter to this point, and is saying she would like to go there  Reason for Continuation of Hospitalization: Anxiety Depression Medical Issues Medication stabilization Suicidal ideation   Estimated Length of Stay: 05/30/17  Attendees: Patient:  05/27/2017  4:41 PM  Physician: Maris Berger, MD 05/27/2017  4:41 PM  Nursing: Jonette Mate, RN 05/27/2017  4:41 PM  RN Care Manager: Lars Pinks, RN 05/27/2017  4:41 PM  Social Worker: Ripley Fraise, [place]; Verdis Frederickson, Social Work Intern 05/27/2017  4:41 PM  Recreational Therapist: Victorino Sparrow, [place] 05/27/2017  4:41 PM  Other: Norberto Sorenson, [place] 05/27/2017  4:41 PM  Other:  05/27/2017  4:41 PM  Other: 05/27/2017  4:41 PM    Scribe for Treatment Team: [name] Mage, LCSW 05/27/2017 4:41 PM

## 2017-05-27 NOTE — Progress Notes (Signed)
Recreation Therapy Notes  Date: 05/27/17 Time: 1000 Location: 500 Hall Dayroom  Group Topic: Self-Esteem  Goal Area(s) Addresses:  Patient will successfully identify positive attributes about themselves.  Patient will successfully identify benefit of improved self-esteem.   Intervention: Colored pencils, facial masks  Activity: How I See Me.  Patients were given a blank outline of a face.  Patients were to fill in the face with pictures or words that describe how they see themselves.  Education:  Self-Esteem, Dentist.   Education Outcome: Acknowledges education/In group clarification offered/Needs additional education  Clinical Observations/Feedback: Pt did not attend group.   Victorino Sparrow, LRT/CTRS         Ria Comment, Meigan Pates A 05/27/2017 11:26 AM

## 2017-05-28 LAB — GLUCOSE, CAPILLARY: GLUCOSE-CAPILLARY: 135 mg/dL — AB (ref 65–99)

## 2017-05-28 MED ORDER — QUETIAPINE FUMARATE 400 MG PO TABS
800.0000 mg | ORAL_TABLET | Freq: Every day | ORAL | Status: DC
  Administered 2017-05-28: 800 mg via ORAL
  Filled 2017-05-28: qty 2
  Filled 2017-05-28: qty 14
  Filled 2017-05-28: qty 4
  Filled 2017-05-28: qty 14
  Filled 2017-05-28: qty 2

## 2017-05-28 NOTE — Progress Notes (Addendum)
Patient ID: Patricia [name], female   DOB: 15-Sep-1970, 46 y.o.   MRN: [id number] Patient denies SI/HI/AVH, was in wheelchair until approximately 2pm this afternoon when she asked to walk out of it.  Pt observed to be walking with steady gait, complained of lower back pain of 8/10, has lidocaine patch to area, and was medicated with Robaxin 500mg  for muscle aches as well.  Pt also complained of feeling anxious, and was medicated with Atarax 25mg  for anxiety.  Pt got into a verbal altercation with another pt on the unit, and staff intervened, and pt is currently calm and cooperative.  Patient went to the cafeteria and ate all three meals of the day, Q15 minute safety checks in place, will continue to monitor, pt denies SI/HI/AVH, and will report to oncoming shift.

## 2017-05-28 NOTE — B[facility] Group Notes (Signed)
LCSW Group Therapy Note  05/28/2017 1:15pm  Type of Therapy/Topic:  Group Therapy:  Balance in Life  Participation Level:  Active  Description of Group:    This group will address the concept of balance and how it feels and looks when one is unbalanced. Patients will be encouraged to process areas in their lives that are out of balance and identify reasons for remaining unbalanced. Facilitators will guide patients in utilizing problem-solving interventions to address and correct the stressor making their life unbalanced. Understanding and applying boundaries will be explored and addressed for obtaining and maintaining a balanced life. Patients will be encouraged to explore ways to assertively make their unbalanced needs known to significant others in their lives, using other group members and facilitator for support and feedback.  Therapeutic Goals: 1. Patient will identify two or more emotions or situations they have that consume much of in their lives. 2. Patient will identify signs/triggers that life has become out of balance:  3. Patient will identify two ways to set boundaries in order to achieve balance in their lives:  4. Patient will demonstrate ability to communicate their needs through discussion and/or role plays  Summary of Patient Progress:  Patricia [name] attended group but was in and out of the room.  At first she appeared nervous and anxious due to not knowing where she will live after she leaves the hospital.  She later appeared to relax and seemed to enjoy the group session more. The emotion that she chose today was fear.  She recognizes that she is fearful because she cries and becomes worried.  When she is fearful her head also begins to pound. She controls this emotion by praying or talking to someone she knows.  She often uses her phone to play games like candy crush to feel better.  She did participate in the deep breathing meditation exercise and found it to be helpful.   However, she states that she will not use this technique outside the hospital because there are to many people around her all the time.  She did say that she would attempt to remember the exercise and might try to use it one day if she felt she needed it.     Therapeutic Modalities:   Cognitive Behavioral Therapy Solution-Focused Therapy Assertiveness Training  Darleen Crocker, [place] Work 05/28/2017 1:30 PM

## 2017-05-28 NOTE — Progress Notes (Signed)
D: Pt in w/c observed in dayroom interacting with peers. Pt at the time of assessment endorsed moderate depression; "the only thing I feel now is just the depression; I feel the medications are working." Pt denied Si, HI, AVH, anxiety or pain; "I feel good." Pt was calm and cooperative.  A: Medications offered as prescribed. All patient's questions and concerns addressed. Support, encouragement, and safe environment provided. Will continue to monitor for any changes. 15-minute safety checks continue. R: Pt was med compliant. Pt attended wrap-up group. Safety checks continue.

## 2017-05-28 NOTE — Social Work (Addendum)
CSW informed that patient's sister has idenfitied [facility] as possible shelter option in [place] contacted agency - this is [employer] which has limited vouchers for [employer] w wait list.  CSW contacted head of [facility] who stated that there are no shelter resources in county, short term shelter beds open in December.  CSW also contacted [facility], they stated they have no voucher housing at present.  Patient must present to agency for in person interview to be placed on the waiting list.  CSW will provide resources to patient and discuss options for housing at discharge.  CSW met w patient, provided list of [place] resources for subsidized housing as well as Psychologist, counselling for [facility].  CSW and patient contacted boyfriend who states that he has just been laid off so cannot continue to live at [facility] himself and anticipates being homeless as well.  Boyfriend will contact agency to discuss situation and appeal for resources.  Pt continues to request "just some money so I can live for a month and get things situated."  "I need a place to lay my head at night, I have never been homeless."  Declines all options of referrals to shelters in [place], [place] and [place].  States that "my mother told me that if yall let me go without me having a place to lay my head, I just need to walk back into the hospital and say the same thing I did last time to get admitted again."  CSW validated patient's concerns about lack of housing, continued to reinforce that community agencies and hospital do not have emergency housing funds, encouraged patient to continue to explore temporary housing options w family and friends.  Pt has interview for disability application on 32/0; states she has been on disability since age 69, however lost disability whille incarcerated in 2012 - 2013; was  denied disability when released from prison most recently.  States she is working with Development worker, community at [employer] to get Medicaid application "she is just trying to see what category I fit under."  Per patient, she has lived in shelter in [place] 2 years ago " I cannot go to a shelter, they make you leave during the day and that's impossible for me."  Edwyna Shell, [place] Social Worker Phone:  [phone]

## 2017-05-28 NOTE — Progress Notes (Signed)
Date:  05/28/2017 Time:  8:42 PM  Group Topic/Focus:  Wrap-Up Group:   The focus of this group is to help patients review their daily goal of treatment and discuss progress on daily workbooks.  Participation Level:  Active  Participation Quality:  Appropriate  Affect:  Appropriate  Cognitive:  Appropriate  Insight: Appropriate  Engagement in Group:  Engaged  Modes of Intervention:  Discussion  Additional Comments:  The patient expressed that she would be lost without her sister. Her goal is to prepare for discharge tomorrow or friday.   Marylene Land 05/28/2017, 8:42 PM
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
## 2017-05-29 LAB — GLUCOSE, CAPILLARY: Glucose-Capillary: 132 mg/dL — ABNORMAL HIGH (ref 65–99)

## 2017-05-29 MED ORDER — HYDROXYZINE HCL 25 MG PO TABS: 25.0000 mg | ORAL_TABLET | Freq: Three times a day (TID) | ORAL | 0 refills | Status: DC | PRN

## 2017-05-29 MED ORDER — PHENYLEPH-SHARK LIV OIL-MO-PET 0.25-3-14-71.9 % RE OINT: TOPICAL_OINTMENT | Freq: Two times a day (BID) | RECTAL | 0 refills | Status: DC | PRN

## 2017-05-29 MED ORDER — GABAPENTIN 400 MG PO CAPS: 400.0000 mg | ORAL_CAPSULE | Freq: Four times a day (QID) | ORAL | 0 refills | Status: DC

## 2017-05-29 MED ORDER — CLONIDINE HCL 0.3 MG PO TABS: 0.3000 mg | ORAL_TABLET | Freq: Two times a day (BID) | ORAL | 0 refills | Status: DC

## 2017-05-29 MED ORDER — LIDOCAINE 5 % EX PTCH: 2.0000 | MEDICATED_PATCH | Freq: Every day | CUTANEOUS | 0 refills | Status: DC

## 2017-05-29 MED ORDER — PAROXETINE HCL 20 MG PO TABS: 20.0000 mg | ORAL_TABLET | Freq: Every day | ORAL | 0 refills | Status: DC

## 2017-05-29 MED ORDER — LINAGLIPTIN 5 MG PO TABS: 5.0000 mg | ORAL_TABLET | Freq: Every day | ORAL | 0 refills | Status: DC

## 2017-05-29 MED ORDER — TRAZODONE HCL 50 MG PO TABS: 50.0000 mg | ORAL_TABLET | Freq: Every evening | ORAL | 0 refills | Status: DC | PRN

## 2017-05-29 MED ORDER — DOCUSATE SODIUM 100 MG PO CAPS: 100.0000 mg | ORAL_CAPSULE | Freq: Two times a day (BID) | ORAL | 0 refills | Status: DC | PRN

## 2017-05-29 MED ORDER — QUETIAPINE FUMARATE 400 MG PO TABS: 800.0000 mg | ORAL_TABLET | Freq: Every day | ORAL | 0 refills | Status: DC

## 2017-05-29 MED ORDER — MONTELUKAST SODIUM 10 MG PO TABS: 10.0000 mg | ORAL_TABLET | Freq: Every day | ORAL | 0 refills | Status: DC

## 2017-05-29 MED ORDER — METHOCARBAMOL 500 MG PO TABS: 500.0000 mg | ORAL_TABLET | Freq: Four times a day (QID) | ORAL | 0 refills | Status: DC | PRN

## 2017-05-29 MED ORDER — ATENOLOL 50 MG PO TABS: 100.0000 mg | ORAL_TABLET | Freq: Two times a day (BID) | ORAL | 0 refills | Status: DC

## 2017-05-29 MED ORDER — OMEPRAZOLE 20 MG PO CPDR: 40.0000 mg | DELAYED_RELEASE_CAPSULE | Freq: Every evening | ORAL | 0 refills | Status: DC
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
## 2017-05-29 NOTE — Progress Notes (Signed)
  [facility] Discharge Plan :  Will you be returning to the same living situation after discharge:  No. At discharge, do you have transportation home?: Yes,  cab Do you have the ability to pay for your medications: Yes,  mental health  Release of information consent forms completed and in the chart;  Patient's signature needed at discharge.  Patient to Follow up at: Follow-up Information    [facility] Follow up on 06/02/2017.   Why:  Monday at 8:30 for your hospital follow up appointment.  Please bring ID, SS card and proof of income Contact information: [place] [address] [phone]           Next level of care provider has access to [place] and Suicide Prevention discussed: Yes,  yes  Have you used any form of tobacco in the last 30 days? (Cigarettes, Smokeless Tobacco, Cigars, and/or Pipes): Patient Refused Screening (reports she "doesn't smoke. no need")  Has patient been referred to the Quitline?: N/A patient is not a smoker  Patient has been referred for addiction treatment: [place], LCSW 05/29/2017, 10:01 AM

## 2017-05-29 NOTE — Progress Notes (Signed)
Patient discharged to lobby. Patient was stable and appreciative at that time. All papers, samples and prescriptions were given and valuables returned. Verbal understanding expressed. Denies SI/HI and A/VH. Patient given opportunity to express concerns and ask questions.  

## 2017-05-29 NOTE — [facility] Suicide Risk Assessment (Signed)
[facility] Discharge Suicide Risk Assessment   Principal Problem: Schizophrenia [facility]) Discharge Diagnoses:  Patient Active Problem List   Diagnosis Date Noted  . Schizophrenia ([place]) [F20.9] 05/21/2017  . Acute renal failure (ARF) ([place]) [N17.9] 05/20/2017  . SBO (small bowel obstruction) ([place]) [K56.609] 03/28/2016  . Small bowel obstruction ([place]) [D32.202] 03/28/2016  . Morbid obesity ([place]) [E66.01] 07/22/2014  . 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 Pain in the chest [R07.9]   . Unstable angina ([place]) [I20.0] 11/29/2013  . Obstructive sleep apnea [G47.33] 04/05/2008  . MIGRAINE HEADACHE [G43.909] 04/05/2008  . Diabetes mellitus type 2 in obese ([place]) [E11.69, E66.9] 03/21/2008  . BIPOLAR DISORDER UNSPECIFIED [F31.9] 03/21/2008  . Anxiety state [F41.1] 03/21/2008  . DEPRESSION [F32.9] 03/21/2008  . BRONCHITIS, CHRONIC [J42] 03/21/2008  . Asthma [J45.909] 03/21/2008    Total Time spent with patient: 30 minutes  Musculoskeletal: Strength & Muscle Tone: within normal limits Gait & Station: normal Patient leans: N/A  Psychiatric Specialty Exam: Review of Systems  Constitutional: Negative for chills and fever.  Respiratory: Negative for cough and shortness of breath.   Cardiovascular: Negative for chest pain and palpitations.  Gastrointestinal: Negative for abdominal pain, heartburn, nausea and vomiting.  Musculoskeletal: Positive for back pain.  Psychiatric/Behavioral: Negative for depression, hallucinations and suicidal ideas. The patient is not nervous/anxious.     Blood pressure (!) 156/103, pulse 84, temperature 98.1 F (36.7 C), resp. rate 19, height 5\' 4"  (1.626 m), weight 123.4 kg (272 lb), last menstrual period 07/11/2014, SpO2 100 %.Body mass index is 46.69 kg/m.  General Appearance: Casual and Fairly Groomed  Eye Contact::  Good  Speech:  Clear and Coherent and Normal Rate409  Volume:  Normal  Mood:  Euthymic  Affect:  Congruent and Full  Range  Thought Process:  Goal Directed  Orientation:  Full (Time, Place, and Person)  Thought Content:  Logical  Suicidal Thoughts:  No  Homicidal Thoughts:  No  Memory:  Immediate;   Good Recent;   Good Remote;   Good  Judgement:  Fair  Insight:  Fair  Psychomotor Activity:  Normal  Concentration:  Good  Recall:  Good  Fund of Knowledge:Good  Language: Good  Akathisia:  No  Handed:    AIMS (if indicated):     Assets:  Communication Skills Desire for Improvement Housing Resilience Social Support  Sleep:  Number of Hours: 5.25  Cognition: WNL  ADL's:  Intact   Mental Status Per Nursing Assessment::   On Admission:     Demographic Factors:  Low socioeconomic status and Living alone  Loss Factors: Financial problems/change in socioeconomic status  Historical Factors: Family history of mental illness or substance abuse  Risk Reduction Factors:   Positive social support, Positive therapeutic relationship and Positive coping skills or problem solving skills  Continued Clinical Symptoms:  Schizophrenia:   Paranoid or undifferentiated type  Cognitive Features That Contribute To Risk:  Thought constriction (tunnel vision)    Suicide Risk:  Minimal: No identifiable suicidal ideation.  Patients presenting with no risk factors but with morbid ruminations; may be classified as minimal risk based on the severity of the depressive symptoms  Follow-up Information    Services, [facility] up.   Contact information: [address] [place] [address] [phone]           Subjective data: - Patricia [name] is a 46 y/o F with history of schizophrenia who was admitted with worsening mood and psychotic symptoms. She was restarted  on medications which were adjusted by titrated up dose of seroquel. Pt reported improvement of her AH symptoms. Today she reports she is doing well overall. She states,"I think I'm ready to go today; I have a place I can stay with my  cousin and I just need my medications until Monday." Pt denied SI/HI/AH/VH. She slept well and her appetite is good. She has physical complaint of chronic low back pain and requests for supply of lidoderm patch to be prescribed at discharge. Pt will be discharged with 7-day supply of her medications. Pt was future oriented regarding her outpatient follow up. She was able to engage in safety planning including to return to [facility] or contact emergency services if she feels unable to maintain her own safety.   Plan Of Care/Follow-up recommendations:  -Discharge to outpatient level of care -Schizophrenia - Continue seroquel 800mg  qhs - Continue Paxil 20 mg qDay -Insomnia - Continue trazodone 50mg  qhs prn insomnia - Chronic pain Continuegabapentin from 300 mg po BID to qid. -Continue Robaxin 500 mg Q 6 hours prn. -Continue Lidoderm patch Q 24 hours prn - Anxiety - Continue atarax 25mg  q8h prn anxiety - Diabetes type II - Continue tradjenta 5mg  qDay - Asthma - Continue singulair 10mg  qDay -GERD - Continue protonix 40mg  qDay - Hypertension - Continue clonidine 0.3mg  BID - Muscle spasm - Continue robaxin 500mg  q6h prn muscle spasm. -Migraine headaches -Continue Imitrex 100 mg Q 2 hours prn.  Activity:  as tolerated Diet:  normal Tests:  NA Other:  see above for [place], MD 05/29/2017, 9:55 AM

## 2017-06-11 MED FILL — hydrOXYzine HCL 25 MG TABS: 25 | 20 days supply | Qty: 60 | Fill #0

## 2017-06-11 MED FILL — ATENOLOL 50 MG TABLET: 50 | 15 days supply | Qty: 60 | Fill #0

## 2017-06-11 MED FILL — GABAPENTIN 400 MG CAPSULE: 400 | 30 days supply | Qty: 120 | Fill #0

## 2017-06-11 MED FILL — cloNIDine HCL 0.3 MG TABS: 0.3 | 30 days supply | Qty: 60 | Fill #0

## 2017-06-11 MED FILL — OMEPRAZOLE 20 MG CAP: 20 | 10 days supply | Qty: 20 | Fill #0

## 2017-06-11 MED FILL — MONTELUKAST SOD 10 MG TAB: 10 | 10 days supply | Qty: 10 | Fill #0

## 2017-06-11 MED FILL — traZODone HCL 50 MG TABS: 50 | 30 days supply | Qty: 30 | Fill #0

## 2017-06-11 MED FILL — LIDOCAINE 5% PATCH: 5 | 10 days supply | Qty: 20 | Fill #0

## 2017-06-11 MED FILL — PARoxetine HCL 20 MG TABS: 20 | 30 days supply | Qty: 30 | Fill #0

## 2017-06-11 MED FILL — QUETIAPINE FUMARATE 400 MG: 400 | 30 days supply | Qty: 60 | Fill #0

## 2017-06-11 MED FILL — METHOCARBAMOL 500 MG TABS: 500 | 5 days supply | Qty: 20 | Fill #0

## 2017-06-12 ENCOUNTER — Ambulatory Visit: Payer: Self-pay | Admitting: Physician Assistant

## 2017-06-12 ENCOUNTER — Telehealth: Payer: Self-pay

## 2017-06-12 NOTE — Telephone Encounter (Signed)
Called client after hearing from [facility] that she had not shown. Client very groggy stating she cannot wake up that she took her medications as they were prescribed by MD at [place]. Client will not make her appointment at the [facility]. RN encouraged to go to [facility] and request a medication review to see if medications need adjusting and if they cannot see her, if she feels they are too strong and making her too groggy and unsafe to go to the Emergency room. Client verbalizes understanding. RN reviewed once more with client to attempt to be seen at [facility] today and if not to go to the Emergency room if she feels too groggy and unsafe with her medications. RN will follow up again this afternoon and client states understanding.  Will attempt to assist client to reschedule free clinic appointment when she is less groggy.  Client is Homeless and had stayed with a friend last night.
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
## 2017-06-12 NOTE — Telephone Encounter (Signed)
Client called to follow up from morning call where client was very groggy and had missed her appointment from the free clinic. Client still sounds groggy and complains of a sore throat this afternoon. She states she got really hot then cooled down and made her throat sore.  Client reports still feeling very groggy. RN had advised her to seek medication adjustment at [facility] this morning. Client states she did so and that [facility] advised her to take half her Quetiapine dose from two tablets at bedtime to one at bedtime.  Client states that she is again staying with a friend tonight and sleeping on their sofa.  RN advised client to seek emergency treatment if she becomes more drowsy, has difficulty speaking , heart palpitations, dizziness, sweating, or other changes. Client verbalized understanding and repeated back seeking emergency treatment. Client rescheduled free Clinic appointment for Monday 06/12/17 and also has an appointment with [facility] on Monday 06/12/17.  Will follow up with client again next week.
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
## 2017-06-16 ENCOUNTER — Encounter: Payer: Self-pay | Admitting: Physician Assistant

## 2017-06-16 ENCOUNTER — Ambulatory Visit: Payer: Self-pay | Admitting: Physician Assistant

## 2017-06-16 ENCOUNTER — Telehealth: Payer: Self-pay

## 2017-06-16 VITALS — BP 140/96 | HR 84 | Resp 24 | Ht 63.75 in | Wt 298.8 lb

## 2017-06-16 DIAGNOSIS — J45901 Unspecified asthma with (acute) exacerbation: Secondary | ICD-10-CM

## 2017-06-16 DIAGNOSIS — E118 Type 2 diabetes mellitus with unspecified complications: Secondary | ICD-10-CM

## 2017-06-16 DIAGNOSIS — N189 Chronic kidney disease, unspecified: Secondary | ICD-10-CM

## 2017-06-16 DIAGNOSIS — I10 Essential (primary) hypertension: Secondary | ICD-10-CM

## 2017-06-16 DIAGNOSIS — F319 Bipolar disorder, unspecified: Secondary | ICD-10-CM

## 2017-06-16 DIAGNOSIS — E785 Hyperlipidemia, unspecified: Secondary | ICD-10-CM

## 2017-06-16 DIAGNOSIS — R062 Wheezing: Secondary | ICD-10-CM

## 2017-06-16 DIAGNOSIS — F1910 Other psychoactive substance abuse, uncomplicated: Secondary | ICD-10-CM

## 2017-06-16 DIAGNOSIS — D649 Anemia, unspecified: Secondary | ICD-10-CM

## 2017-06-16 MED ORDER — METHYLPREDNISOLONE ACETATE 80 MG/ML IJ SUSP
80.0000 mg | Freq: Once | INTRAMUSCULAR | Status: AC
  Administered 2017-06-16: 80 mg via INTRAMUSCULAR

## 2017-06-16 MED ORDER — IPRATROPIUM-ALBUTEROL 0.5-2.5 (3) MG/3ML IN SOLN
3.0000 mL | Freq: Once | RESPIRATORY_TRACT | Status: AC
  Administered 2017-06-16: 3 mL via RESPIRATORY_TRACT

## 2017-06-16 NOTE — Telephone Encounter (Signed)
Patient called trying to find out when her free clinic is for today. Client states system down at free clinic. She is also wanting phone number for [facility] she cannot get a ride there today and needs to reschedule.  Free clinic will call her back when system is up. RN told client to be there by 1pm so she does not miss her appointment. Client states understanding. Client to reschedule [facility] and will call RN back to arrange [facility] transportation to [facility]. Client states she is doing better on her medications and following the recommendation of lowering her Seroquel as directed by [facility] last Thursday. Client sounds less groggy today and more alert. She does complain of a cold. She states she has a friend bringing her meals. She is [facility]. States she is living in a "bad house" but "I have blankets and things".   Will follow as needed.

## 2017-06-16 NOTE — Patient Instructions (Addendum)
Atenolol---- metoprolol Clonidine---clonidine tradjenta--- metformin Omeprazole---omeprazole Montelukast----montelukast ---------------------albuterol MDI ----------------------atorvastatin

## 2017-06-16 NOTE — Progress Notes (Signed)
BP (!) 140/96 (BP Location: Left Arm, Patient Position: Sitting, Cuff Size: Large)   Pulse 84   Resp (!) 24   Ht 5' 3.75" (1.619 m)   Wt 298 lb 12 oz (135.5 kg)   LMP 07/11/2014 (Exact Date)   SpO2 97%   BMI 51.68 kg/m    Subjective:    Patient ID: Patricia [name], female    DOB: [birth year], 46 y.o.   MRN: [id number]  HPI: Patricia [name] is a 47 y.o. female presenting on 06/16/2017 for New Patient (Initial Visit) (pt did not bring meds with her today pt states she recieved assistance from [facility] on getting her medications on Thurday or Friday) and Wheezing (began over a week ago. pt states coughing, sneezing, runny nose, chest pain, began over the weekend.)   HPI  Chief Complaint  Patient presents with  . New Patient (Initial Visit)    pt did not bring meds with her today pt states she recieved assistance from [facility] on getting her medications on Thurday or Friday  . Wheezing    began over a week ago. pt states coughing, sneezing, runny nose, chest pain, began over the weekend.    Pt just recently moved to [facility].  Prior to that she was being treated at [facility] in [place] (health dept).  Pt went to [facility] last week.  She has MH issues.   Pt chart says dm dx 2015 but pt says was only told about it a few weeks ago.   revew of chart with many ER visits confirms pt dx dm years ago.  Pt still says no one ever did anything about it.  Pt not very honest about history.  Pt was in hospital in October was due to mental health issues.  On 05/23/17 her a1c was 6.8  Pt was prescribed metformin in the past but she says she never got it filled  Pt states back pain > year.  She says she thinks she has a pinched nerve.  She says [facility] told her that.    Pt says she applied for medicaid  Pt says she never used controlled substances.  UDS + last month.   Pt not very honest about history.    Relevant past medical, surgical, family and social history reviewed and  updated as indicated. Interim medical history since our last visit reviewed. Allergies and medications reviewed and updated.   Current Outpatient Medications:  .  atenolol (TENORMIN) 50 MG tablet, Take 2 tablets (100 mg total) by mouth 2 (two) times daily. For high blood pressure, Disp: 60 tablet, Rfl: 0 .  cloNIDine (CATAPRES) 0.3 MG tablet, Take 1 tablet (0.3 mg total) by mouth 2 (two) times daily. For high blood pressure, Disp: 60 tablet, Rfl: 0 .  docusate sodium (COLACE) 100 MG capsule, Take 1 capsule (100 mg total) by mouth 2 (two) times daily as needed for mild constipation., Disp: 1 capsule, Rfl: 0 .  gabapentin (NEURONTIN) 400 MG capsule, Take 1 capsule (400 mg total) by mouth 4 (four) times daily. For agitation, Disp: 120 capsule, Rfl: 0 .  hydrOXYzine (ATARAX/VISTARIL) 25 MG tablet, Take 1 tablet (25 mg total) by mouth 3 (three) times daily as needed for anxiety., Disp: 60 tablet, Rfl: 0 .  lidocaine (LIDODERM) 5 %, Place 2 patches onto the skin daily. Remove & Discard patch within 12 hours or as directed by MD: For pain management, Disp: 20 patch, Rfl: 0 .  linagliptin (TRADJENTA) 5 MG  TABS tablet, Take 1 tablet (5 mg total) by mouth daily. For diabetes, Disp: 30 tablet, Rfl: 0 .  methocarbamol (ROBAXIN) 500 MG tablet, Take 1 tablet (500 mg total) by mouth every 6 (six) hours as needed for muscle spasms., Disp: 20 tablet, Rfl: 0 .  montelukast (SINGULAIR) 10 MG tablet, Take 1 tablet (10 mg total) by mouth daily. For shortness of breath, Disp: 10 tablet, Rfl: 0 .  omeprazole (PRILOSEC) 20 MG capsule, Take 2 capsules (40 mg total) by mouth every evening. For acid reflux, Disp: 20 capsule, Rfl: 0 .  PARoxetine (PAXIL) 20 MG tablet, Take 1 tablet (20 mg total) by mouth daily. For depression, Disp: 30 tablet, Rfl: 0 .  phenylephrine-shark liver oil-mineral oil-petrolatum (PREPARATION H) 0.25-3-14-71.9 % rectal ointment, Place rectally 2 (two) times daily as needed for hemorrhoids., Disp: 30 g,  Rfl: 0 .  QUEtiapine (SEROQUEL) 400 MG tablet, Take 2 tablets (800 mg total) by mouth at bedtime. For mood control, Disp: 60 tablet, Rfl: 0 .  traZODone (DESYREL) 50 MG tablet, Take 1 tablet (50 mg total) by mouth at bedtime as needed for sleep., Disp: 30 tablet, Rfl: 0  Review of Systems  Constitutional: Positive for chills, diaphoresis and fatigue. Negative for appetite change, fever and unexpected weight change.  HENT: Positive for sneezing and sore throat. Negative for congestion, dental problem, drooling, ear pain, facial swelling, hearing loss, mouth sores, trouble swallowing and voice change.   Eyes: Negative for pain, discharge, redness, itching and visual disturbance.  Respiratory: Positive for cough and wheezing. Negative for choking and shortness of breath.   Cardiovascular: Positive for leg swelling. Negative for chest pain and palpitations.  Gastrointestinal: Negative for abdominal pain, blood in stool, constipation, diarrhea and vomiting.  Endocrine: Negative for cold intolerance, heat intolerance and polydipsia.  Genitourinary: Negative for decreased urine volume, dysuria and hematuria.  Musculoskeletal: Positive for arthralgias, back pain and gait problem.  Skin: Negative for rash.  Allergic/Immunologic: Negative for environmental allergies.  Neurological: Positive for headaches. Negative for seizures, syncope and light-headedness.  Hematological: Negative for adenopathy.  Psychiatric/Behavioral: Positive for agitation and dysphoric mood. Negative for suicidal ideas. The patient is not nervous/anxious.     Per HPI unless specifically indicated above     Objective:    BP (!) 140/96 (BP Location: Left Arm, Patient Position: Sitting, Cuff Size: Large)   Pulse 84   Resp (!) 24   Ht 5' 3.75" (1.619 m)   Wt 298 lb 12 oz (135.5 kg)   LMP 07/11/2014 (Exact Date)   SpO2 97%   BMI 51.68 kg/m   Wt Readings from Last 3 Encounters:  06/16/17 298 lb 12 oz (135.5 kg)  06/11/17  282 lb 12.8 oz (128.3 kg)  05/19/17 275 lb (124.7 kg)    Physical Exam  Constitutional: She is oriented to person, place, and time. She appears well-developed and well-nourished.  HENT:  Head: Normocephalic and atraumatic.  Mouth/Throat: Oropharynx is clear and moist. No oropharyngeal exudate.  Eyes: Conjunctivae and EOM are normal. Pupils are equal, round, and reactive to light.  Neck: Neck supple. No thyromegaly present.  Cardiovascular: Normal rate and regular rhythm.  Pulmonary/Chest: Effort normal. No respiratory distress. She has wheezes. She has no rhonchi. She has no rales.  Breath sounds improved after nebulizer treatment  Abdominal: Soft. Bowel sounds are normal. She exhibits no mass. There is no hepatosplenomegaly. There is no tenderness.  Musculoskeletal: She exhibits no edema.  Lymphadenopathy:    She has no cervical  adenopathy.  Neurological: She is alert and oriented to person, place, and time. Gait normal.  Skin: Skin is warm and dry.  Psychiatric: She has a normal mood and affect. Her behavior is normal.  Vitals reviewed.   Results for orders placed or performed during the hospital encounter of 05/19/17  Urine Culture  Result Value Ref Range   Specimen Description URINE, RANDOM    Special Requests NONE    Culture MULTIPLE SPECIES PRESENT, SUGGEST RECOLLECTION (A)    Report Status 05/21/2017 FINAL   CBC with Differential  Result Value Ref Range   WBC 9.1 4.0 - 10.5 K/uL   RBC 5.03 3.87 - 5.11 MIL/uL   Hemoglobin 12.9 12.0 - 15.0 g/dL   HCT 40.5 36.0 - 46.0 %   MCV 80.5 78.0 - 100.0 fL   MCH 25.6 (L) 26.0 - 34.0 pg   MCHC 31.9 30.0 - 36.0 g/dL   RDW 15.0 11.5 - 15.5 %   Platelets 352 150 - 400 K/uL   Neutrophils Relative % 69 %   Neutro Abs 6.2 1.7 - 7.7 K/uL   Lymphocytes Relative 23 %   Lymphs Abs 2.1 0.7 - 4.0 K/uL   Monocytes Relative 7 %   Monocytes Absolute 0.7 0.1 - 1.0 K/uL   Eosinophils Relative 1 %   Eosinophils Absolute 0.1 0.0 - 0.7 K/uL    Basophils Relative 0 %   Basophils Absolute 0.0 0.0 - 0.1 K/uL  Basic metabolic panel  Result Value Ref Range   Sodium 142 135 - 145 mmol/L   Potassium 3.0 (L) 3.5 - 5.1 mmol/L   Chloride 106 101 - 111 mmol/L   CO2 23 22 - 32 mmol/L   Glucose, Bld 137 (H) 65 - 99 mg/dL   BUN 16 6 - 20 mg/dL   Creatinine, Ser 1.00 0.44 - 1.00 mg/dL   Calcium 9.7 8.9 - 10.3 mg/dL   GFR calc non Af Amer >60 >60 mL/min   GFR calc Af Amer >60 >60 mL/min   Anion gap 13 5 - 15  Urinalysis, Routine w reflex microscopic  Result Value Ref Range   Color, Urine AMBER (A) YELLOW   APPearance CLOUDY (A) CLEAR   Specific Gravity, Urine 1.030 1.005 - 1.030   pH 5.0 5.0 - 8.0   Glucose, UA NEGATIVE NEGATIVE mg/dL   Hgb urine dipstick NEGATIVE NEGATIVE   Bilirubin Urine NEGATIVE NEGATIVE   Ketones, ur 20 (A) NEGATIVE mg/dL   Protein, ur 100 (A) NEGATIVE mg/dL   Nitrite NEGATIVE NEGATIVE   Leukocytes, UA NEGATIVE NEGATIVE   RBC / HPF 0-5 0 - 5 RBC/hpf   WBC, UA 6-30 0 - 5 WBC/hpf   Bacteria, UA RARE (A) NONE SEEN   Squamous Epithelial / LPF 6-30 (A) NONE SEEN   WBC Clumps PRESENT    Mucus PRESENT   Ethanol  Result Value Ref Range   Alcohol, Ethyl (B) <10 <10 mg/dL  Rapid urine drug screen (hospital performed)  Result Value Ref Range   Opiates NONE DETECTED NONE DETECTED   Cocaine POSITIVE (A) NONE DETECTED   Benzodiazepines POSITIVE (A) NONE DETECTED   Amphetamines NONE DETECTED NONE DETECTED   Tetrahydrocannabinol POSITIVE (A) NONE DETECTED   Barbiturates NONE DETECTED NONE DETECTED  Basic metabolic panel  Result Value Ref Range   Sodium 139 135 - 145 mmol/L   Potassium 4.1 3.5 - 5.1 mmol/L   Chloride 106 101 - 111 mmol/L   CO2 22 22 - 32 mmol/L  Glucose, Bld 134 (H) 65 - 99 mg/dL   BUN 31 (H) 6 - 20 mg/dL   Creatinine, Ser 2.62 (H) 0.44 - 1.00 mg/dL   Calcium 9.6 8.9 - 10.3 mg/dL   GFR calc non Af Amer 21 (L) >60 mL/min   GFR calc Af Amer 24 (L) >60 mL/min   Anion gap 11 5 - 15  HIV antibody  (Routine Testing)  Result Value Ref Range   HIV Screen 4th Generation wRfx Non Reactive Non Reactive  CBC  Result Value Ref Range   WBC 8.2 4.0 - 10.5 K/uL   RBC 4.48 3.87 - 5.11 MIL/uL   Hemoglobin 11.6 (L) 12.0 - 15.0 g/dL   HCT 36.5 36.0 - 46.0 %   MCV 81.5 78.0 - 100.0 fL   MCH 25.9 (L) 26.0 - 34.0 pg   MCHC 31.8 30.0 - 36.0 g/dL   RDW 15.2 11.5 - 15.5 %   Platelets 307 150 - 400 K/uL  Creatinine, serum  Result Value Ref Range   Creatinine, Ser 2.96 (H) 0.44 - 1.00 mg/dL   GFR calc non Af Amer 18 (L) >60 mL/min   GFR calc Af Amer 21 (L) >60 mL/min  Basic metabolic panel  Result Value Ref Range   Sodium 138 135 - 145 mmol/L   Potassium 3.3 (L) 3.5 - 5.1 mmol/L   Chloride 107 101 - 111 mmol/L   CO2 23 22 - 32 mmol/L   Glucose, Bld 138 (H) 65 - 99 mg/dL   BUN 32 (H) 6 - 20 mg/dL   Creatinine, Ser 1.63 (H) 0.44 - 1.00 mg/dL   Calcium 8.7 (L) 8.9 - 10.3 mg/dL   GFR calc non Af Amer 37 (L) >60 mL/min   GFR calc Af Amer 42 (L) >60 mL/min   Anion gap 8 5 - 15  CBG monitoring, ED  Result Value Ref Range   Glucose-Capillary 147 (H) 65 - 99 mg/dL  I-stat Chem 8, ED  Result Value Ref Range   Sodium 141 135 - 145 mmol/L   Potassium 3.9 3.5 - 5.1 mmol/L   Chloride 110 101 - 111 mmol/L   BUN 33 (H) 6 - 20 mg/dL   Creatinine, Ser 2.80 (H) 0.44 - 1.00 mg/dL   Glucose, Bld 129 (H) 65 - 99 mg/dL   Calcium, Ion 1.15 1.15 - 1.40 mmol/L   TCO2 22 22 - 32 mmol/L   Hemoglobin 13.3 12.0 - 15.0 g/dL   HCT 39.0 36.0 - 46.0 %      Assessment & Plan:     Encounter Diagnoses  Name Primary?  . Essential hypertension Yes  . Wheezing   . Hyperlipidemia, unspecified hyperlipidemia type   . Chronic kidney disease, unspecified CKD stage   . Controlled diabetes mellitus type 2 with complications, unspecified whether long term insulin use ([place])   . Asthma with acute exacerbation, unspecified asthma severity, unspecified whether persistent   . Anemia, unspecified type   . Polysubstance  abuse ([place])   . Bipolar affective disorder, remission status unspecified ([facility])      -Check bmp, microalbumin today or tomorrow -pt was signed up for [facility] -pt was given a list of medication changes that will occur with her change to [facility].           D/c trajenta and rx metfomin.         D/c atenolol and rx metoprolol         Continue clonidine, omeprazole, singulair  Add albuterol mdi and atorvastatin  -will request Records from [facility] related to back pain -pt was given Shot depomedrol for her breathing.  Pt has albuterol mdi.  -pt reminded to bring all meds with her to every appt -pt to follow up 1 month.  RTO sooner prn

## 2017-06-17 ENCOUNTER — Encounter (HOSPITAL_COMMUNITY): Payer: Self-pay | Admitting: *Deleted

## 2017-06-17 ENCOUNTER — Emergency Department (HOSPITAL_COMMUNITY): Payer: Self-pay

## 2017-06-17 ENCOUNTER — Emergency Department (HOSPITAL_COMMUNITY)
Admission: EM | Admit: 2017-06-17 | Discharge: 2017-06-17 | Disposition: A | Discharge status: Home / Self Care | Payer: Self-pay | Attending: Emergency Medicine | Admitting: Emergency Medicine

## 2017-06-17 ENCOUNTER — Other Ambulatory Visit: Payer: Self-pay

## 2017-06-17 DIAGNOSIS — Z87891 Personal history of nicotine dependence: Secondary | ICD-10-CM | POA: Insufficient documentation

## 2017-06-17 DIAGNOSIS — Z79899 Other long term (current) drug therapy: Secondary | ICD-10-CM | POA: Insufficient documentation

## 2017-06-17 DIAGNOSIS — E119 Type 2 diabetes mellitus without complications: Secondary | ICD-10-CM | POA: Insufficient documentation

## 2017-06-17 DIAGNOSIS — J45909 Unspecified asthma, uncomplicated: Secondary | ICD-10-CM | POA: Insufficient documentation

## 2017-06-17 DIAGNOSIS — J069 Acute upper respiratory infection, unspecified: Secondary | ICD-10-CM

## 2017-06-17 DIAGNOSIS — J209 Acute bronchitis, unspecified: Secondary | ICD-10-CM | POA: Insufficient documentation

## 2017-06-17 DIAGNOSIS — J40 Bronchitis, not specified as acute or chronic: Secondary | ICD-10-CM

## 2017-06-17 DIAGNOSIS — I10 Essential (primary) hypertension: Secondary | ICD-10-CM | POA: Insufficient documentation

## 2017-06-17 LAB — INFLUENZA PANEL BY PCR (TYPE A & B)
INFLAPCR: NEGATIVE
INFLBPCR: NEGATIVE

## 2017-06-17 MED ORDER — ALBUTEROL SULFATE HFA 108 (90 BASE) MCG/ACT IN AERS
2.0000 | INHALATION_SPRAY | Freq: Once | RESPIRATORY_TRACT | Status: AC
  Administered 2017-06-17: 2 via RESPIRATORY_TRACT
  Filled 2017-06-17: qty 6.7

## 2017-06-17 MED ORDER — PROMETHAZINE HCL 12.5 MG PO TABS
12.5000 mg | ORAL_TABLET | Freq: Once | ORAL | Status: AC
  Administered 2017-06-17: 12.5 mg via ORAL
  Filled 2017-06-17: qty 1

## 2017-06-17 MED ORDER — PROMETHAZINE-DM 6.25-15 MG/5ML PO SYRP: 5.0000 mL | ORAL_SOLUTION | Freq: Four times a day (QID) | ORAL | 0 refills | Status: DC

## 2017-06-17 MED ORDER — PREDNISONE 50 MG PO TABS
60.0000 mg | ORAL_TABLET | Freq: Once | ORAL | Status: AC
  Administered 2017-06-17: 60 mg via ORAL
  Filled 2017-06-17: qty 1

## 2017-06-17 MED ORDER — TRAMADOL HCL 50 MG PO TABS
100.0000 mg | ORAL_TABLET | Freq: Once | ORAL | Status: AC
  Administered 2017-06-17: 100 mg via ORAL
  Filled 2017-06-17: qty 2

## 2017-06-17 NOTE — Discharge Instructions (Signed)
Your x-ray is negative for pneumonia or other problems.  Your influenza test is normal.  Your vital signs are within normal limits.  Your examination suggest bronchitis and upper respiratory infection.  Please increase fluids.  Please use 2 puffs of albuterol every 4 hours.  Use promethazine cough medication every 6 hours as needed.  This medication may cause drowsiness, please use it with caution.  Please use ibuprofen every 6 hours for body aches.  May use Tylenol in between the ibuprofen doses.

## 2017-06-17 NOTE — ED Triage Notes (Signed)
Pt c/o cold/hot chills, generalized bodyaches, Tynan Boesel and yellow colored productive cough x 4 days. [facility] and IM shot with no relief per pt.
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
## 2017-06-23 ENCOUNTER — Encounter: Payer: Self-pay | Admitting: Physician Assistant

## 2017-06-23 ENCOUNTER — Other Ambulatory Visit: Payer: Self-pay | Admitting: Physician Assistant

## 2017-06-23 MED ORDER — OMEPRAZOLE 20 MG PO CPDR: 40.0000 mg | DELAYED_RELEASE_CAPSULE | Freq: Every evening | ORAL | 0 refills | Status: DC

## 2017-06-23 MED ORDER — ALBUTEROL SULFATE HFA 108 (90 BASE) MCG/ACT IN AERS: 2.0000 | INHALATION_SPRAY | Freq: Four times a day (QID) | RESPIRATORY_TRACT | 1 refills | Status: DC | PRN

## 2017-06-23 MED ORDER — ATORVASTATIN CALCIUM 20 MG PO TABS: 20.0000 mg | ORAL_TABLET | Freq: Every day | ORAL | 1 refills | Status: DC

## 2017-06-23 MED ORDER — METFORMIN HCL 500 MG PO TABS: 500.0000 mg | ORAL_TABLET | Freq: Two times a day (BID) | ORAL | 1 refills | Status: DC

## 2017-06-23 MED ORDER — MONTELUKAST SODIUM 10 MG PO TABS: 10.0000 mg | ORAL_TABLET | Freq: Every day | ORAL | 1 refills | Status: DC

## 2017-06-23 MED ORDER — CLONIDINE HCL 0.3 MG PO TABS: 0.3000 mg | ORAL_TABLET | Freq: Two times a day (BID) | ORAL | 0 refills | Status: DC

## 2017-06-23 MED ORDER — METOPROLOL TARTRATE 100 MG PO TABS: 100.0000 mg | ORAL_TABLET | Freq: Two times a day (BID) | ORAL | 1 refills | Status: DC

## 2017-06-25 ENCOUNTER — Ambulatory Visit: Payer: Self-pay | Admitting: Physician Assistant

## 2017-06-25 ENCOUNTER — Encounter: Payer: Self-pay | Admitting: Physician Assistant

## 2017-06-25 VITALS — BP 152/90 | HR 77 | Temp 97.7°F | Ht 63.75 in | Wt 293.5 lb

## 2017-06-25 DIAGNOSIS — J440 Chronic obstructive pulmonary disease with acute lower respiratory infection: Secondary | ICD-10-CM

## 2017-06-25 DIAGNOSIS — J209 Acute bronchitis, unspecified: Secondary | ICD-10-CM | POA: Insufficient documentation

## 2017-06-25 DIAGNOSIS — J40 Bronchitis, not specified as acute or chronic: Secondary | ICD-10-CM

## 2017-06-25 MED ORDER — SULFAMETHOXAZOLE-TRIMETHOPRIM 800-160 MG PO TABS: 1.0000 | ORAL_TABLET | Freq: Two times a day (BID) | ORAL | 0 refills | Status: DC

## 2017-06-25 MED ORDER — METHYLPREDNISOLONE ACETATE 80 MG/ML IJ SUSP
80.0000 mg | Freq: Once | INTRAMUSCULAR | Status: AC
  Administered 2017-06-25: 80 mg via INTRAMUSCULAR

## 2017-06-25 MED ORDER — CEFTRIAXONE SODIUM 1 G IJ SOLR
1.0000 g | Freq: Once | INTRAMUSCULAR | Status: AC
  Administered 2017-06-25: 1 g via INTRAMUSCULAR

## 2017-06-25 MED ORDER — IPRATROPIUM-ALBUTEROL 0.5-2.5 (3) MG/3ML IN SOLN: 3.0000 mL | Freq: Once | RESPIRATORY_TRACT | Status: DC

## 2017-06-25 MED ORDER — IPRATROPIUM-ALBUTEROL 0.5-2.5 (3) MG/3ML IN SOLN
3.0000 mL | Freq: Once | RESPIRATORY_TRACT | Status: AC
  Administered 2017-06-25: 3 mL via RESPIRATORY_TRACT

## 2017-06-25 NOTE — Progress Notes (Signed)
BP (!) 152/90 (BP Location: Left Arm, Patient Position: Sitting, Cuff Size: Large)   Pulse 77   Temp 97.7 F (36.5 C) (Other (Comment))   Ht 5' 3.75" (1.619 m)   Wt 293 lb 8 oz (133.1 kg)   LMP 07/11/2014 (Exact Date)   SpO2 97%   BMI 50.78 kg/m    Subjective:    Patient ID: Patricia [name], female    DOB: Jun 27, 1971, 46 y.o.   MRN: [id number]  HPI: [name] is a 46 y.o. female presenting on 06/25/2017 for Cough ("since last visit, cough has gotten worse, produces thick mucus")   HPI   Pt was seen here for same thing on 06/16/2017 last week and got breathing treatment and shot of depo-medrol.   She then went to ER for same thing on 06/17/17 and was given rx promethazine DM that she has not gotten filled yet.   Pt circumstances not conducive to getting well as she says she is homeless and living in her car.     Relevant past medical, surgical, family and social history reviewed and updated as indicated. Interim medical history since our last visit reviewed. Allergies and medications reviewed and updated.   Current Outpatient Medications:  .  albuterol (PROVENTIL HFA;VENTOLIN HFA) 108 (90 Base) MCG/ACT inhaler, Inhale 2 puffs into the lungs every 6 (six) hours as needed for wheezing or shortness of breath., Disp: 1 Inhaler, Rfl: 1 .  atenolol (TENORMIN) 50 MG tablet, Take 2 tablets (100 mg total) by mouth 2 (two) times daily. For high blood pressure, Disp: 60 tablet, Rfl: 0 .  cloNIDine (CATAPRES) 0.3 MG tablet, Take 1 tablet (0.3 mg total) by mouth 2 (two) times daily. For high blood pressure, Disp: 180 tablet, Rfl: 0 .  docusate sodium (COLACE) 100 MG capsule, Take 1 capsule (100 mg total) by mouth 2 (two) times daily as needed for mild constipation., Disp: 1 capsule, Rfl: 0 .  gabapentin (NEURONTIN) 400 MG capsule, Take 1 capsule (400 mg total) by mouth 4 (four) times daily. For agitation, Disp: 120 capsule, Rfl: 0 .  hydrOXYzine (ATARAX/VISTARIL) 25 MG tablet, Take  1 tablet (25 mg total) by mouth 3 (three) times daily as needed for anxiety., Disp: 60 tablet, Rfl: 0 .  lidocaine (LIDODERM) 5 %, Place 2 patches onto the skin daily. Remove & Discard patch within 12 hours or as directed by MD: For pain management, Disp: 20 patch, Rfl: 0 .  linagliptin (TRADJENTA) 5 MG TABS tablet, Take 1 tablet (5 mg total) by mouth daily. For diabetes, Disp: 30 tablet, Rfl: 0 .  metoprolol tartrate (LOPRESSOR) 100 MG tablet, Take 1 tablet (100 mg total) by mouth 2 (two) times daily. (this replaces atenolol), Disp: 180 tablet, Rfl: 1 .  montelukast (SINGULAIR) 10 MG tablet, Take 1 tablet (10 mg total) by mouth daily. For shortness of breath, Disp: 90 tablet, Rfl: 1 .  omeprazole (PRILOSEC) 20 MG capsule, Take 2 capsules (40 mg total) by mouth every evening. For acid reflux, Disp: 90 capsule, Rfl: 0 .  PARoxetine (PAXIL) 20 MG tablet, Take 1 tablet (20 mg total) by mouth daily. For depression, Disp: 30 tablet, Rfl: 0 .  phenylephrine-shark liver oil-mineral oil-petrolatum (PREPARATION H) 0.25-3-14-71.9 % rectal ointment, Place rectally 2 (two) times daily as needed for hemorrhoids., Disp: 30 g, Rfl: 0 .  QUEtiapine (SEROQUEL) 400 MG tablet, Take 2 tablets (800 mg total) by mouth at bedtime. For mood control, Disp: 60 tablet, Rfl: 0 .  traZODone (DESYREL) 50 MG tablet, Take 1 tablet (50 mg total) by mouth at bedtime as needed for sleep., Disp: 30 tablet, Rfl: 0 .  atorvastatin (LIPITOR) 20 MG tablet, Take 1 tablet (20 mg total) by mouth daily. (Patient not taking: Reported on 06/25/2017), Disp: 90 tablet, Rfl: 1 .  metFORMIN (GLUCOPHAGE) 500 MG tablet, Take 1 tablet (500 mg total) by mouth 2 (two) times daily with a meal. (Patient not taking: Reported on 06/25/2017), Disp: 180 tablet, Rfl: 1 .  methocarbamol (ROBAXIN) 500 MG tablet, Take 1 tablet (500 mg total) by mouth every 6 (six) hours as needed for muscle spasms. (Patient not taking: Reported on 06/25/2017), Disp: 20 tablet, Rfl:  0 .  promethazine-dextromethorphan (PROMETHAZINE-DM) 6.25-15 MG/5ML syrup, Take 5 mLs by mouth every 6 (six) hours. (Patient not taking: Reported on 06/25/2017), Disp: 118 mL, Rfl: 0   Review of Systems  Constitutional: Positive for chills, diaphoresis and fatigue. Negative for appetite change, fever and unexpected weight change.  HENT: Positive for congestion, sneezing, sore throat and voice change. Negative for dental problem, drooling, ear pain, facial swelling, hearing loss, mouth sores and trouble swallowing.   Eyes: Negative for pain, discharge, redness, itching and visual disturbance.  Respiratory: Positive for cough, shortness of breath and wheezing. Negative for choking.   Cardiovascular: Negative for chest pain, palpitations and leg swelling.  Gastrointestinal: Negative for abdominal pain, blood in stool, constipation, diarrhea and vomiting (post tussive emesis).  Endocrine: Negative for cold intolerance, heat intolerance and polydipsia.  Genitourinary: Negative for decreased urine volume, dysuria and hematuria.  Musculoskeletal: Positive for back pain. Negative for arthralgias and gait problem.  Skin: Negative for rash.  Allergic/Immunologic: Negative for environmental allergies.  Neurological: Positive for light-headedness and headaches. Negative for seizures and syncope.  Hematological: Negative for adenopathy.  Psychiatric/Behavioral: Positive for dysphoric mood. Negative for agitation and suicidal ideas. The patient is nervous/anxious.     Per HPI unless specifically indicated above     Objective:    BP (!) 152/90 (BP Location: Left Arm, Patient Position: Sitting, Cuff Size: Large)   Pulse 77   Temp 97.7 F (36.5 C) (Other (Comment))   Ht 5' 3.75" (1.619 m)   Wt 293 lb 8 oz (133.1 kg)   LMP 07/11/2014 (Exact Date)   SpO2 97%   BMI 50.78 kg/m   Wt Readings from Last 3 Encounters:  06/25/17 293 lb 8 oz (133.1 kg)  06/17/17 298 lb (135.2 kg)  06/16/17 298 lb 12 oz  (135.5 kg)    Physical Exam  Constitutional: She is oriented to person, place, and time. She appears well-developed and well-nourished.  HENT:  Head: Normocephalic and atraumatic.  Right Ear: Hearing, tympanic membrane, external ear and ear canal normal.  Left Ear: Hearing, tympanic membrane, external ear and ear canal normal.  Nose: Rhinorrhea present.  Mouth/Throat: Uvula is midline and oropharynx is clear and moist. No oropharyngeal exudate.  + nasal congestion  Neck: Neck supple.  Cardiovascular: Normal rate and regular rhythm.  Pulmonary/Chest: Effort normal. No tachypnea. No respiratory distress. She has wheezes. She has no rhonchi.  Abdominal: Soft. Bowel sounds are normal. She exhibits no mass. There is no hepatosplenomegaly. There is no tenderness.  Musculoskeletal: She exhibits no edema.  Lymphadenopathy:    She has no cervical adenopathy.  Neurological: She is alert and oriented to person, place, and time.  Skin: Skin is warm and dry.  Psychiatric: She has a normal mood and affect. Her behavior is normal.  Vitals reviewed.  Assessment & Plan:    Encounter Diagnosis  Name Primary?  . Bronchitis Yes    -pt given another shot of depo-medrol in office today as well as rocephin and a nebulizer treatment.   [facility] nursing was contacted and agreed to cover pt's rx for septra ds and promethazine dm.  Pt has an inhaler that she is to use prn.  Pt encouraged to try to find friend or family member to stay with for a few days to help her get well -pt to follow up as scheduled in 2 wk or RTO sooner prn worsening or new symptoms

## 2017-07-14 ENCOUNTER — Ambulatory Visit: Payer: Self-pay | Admitting: Physician Assistant

## 2017-07-17 ENCOUNTER — Telehealth: Payer: Self-pay | Admitting: Student

## 2017-07-17 ENCOUNTER — Other Ambulatory Visit: Payer: Self-pay | Admitting: Physician Assistant

## 2017-07-17 MED ORDER — IBUPROFEN 800 MG PO TABS: 800.0000 mg | ORAL_TABLET | Freq: Three times a day (TID) | ORAL | 0 refills | Status: DC | PRN

## 2017-07-17 NOTE — Telephone Encounter (Signed)
Pt called c/o back pain. Pt states she is taking gabapentin and a muscle relaxer but has not been helpful.   After PA read notes from previous provider who treated pt's back pain PA advised that gabapentin was written for her agitation not for back pain. If pt was in need for more gabapentin, pt is to request it from [facility]. PA advised that she could send rx for IBU 800 to a local pharmacy. Pt states she would like IBU sent to [facility]. Pt to discuss further with PA at f/u appointment. Pt verbalized understanding.

## 2017-07-24 ENCOUNTER — Other Ambulatory Visit: Payer: Self-pay | Admitting: Physician Assistant

## 2017-07-24 MED ORDER — DICLOFENAC SODIUM 75 MG PO TBEC: 75.0000 mg | DELAYED_RELEASE_TABLET | Freq: Two times a day (BID) | ORAL | 0 refills | Status: DC | PRN

## 2017-07-26 ENCOUNTER — Encounter (HOSPITAL_COMMUNITY): Payer: Self-pay | Admitting: *Deleted

## 2017-07-26 DIAGNOSIS — Z87891 Personal history of nicotine dependence: Secondary | ICD-10-CM | POA: Insufficient documentation

## 2017-07-26 DIAGNOSIS — I10 Essential (primary) hypertension: Secondary | ICD-10-CM | POA: Insufficient documentation

## 2017-07-26 DIAGNOSIS — E119 Type 2 diabetes mellitus without complications: Secondary | ICD-10-CM | POA: Insufficient documentation

## 2017-07-26 DIAGNOSIS — M5441 Lumbago with sciatica, right side: Secondary | ICD-10-CM | POA: Insufficient documentation

## 2017-07-26 DIAGNOSIS — Z79899 Other long term (current) drug therapy: Secondary | ICD-10-CM | POA: Insufficient documentation

## 2017-07-26 DIAGNOSIS — R42 Dizziness and giddiness: Secondary | ICD-10-CM | POA: Insufficient documentation

## 2017-07-26 DIAGNOSIS — Z7984 Long term (current) use of oral hypoglycemic drugs: Secondary | ICD-10-CM | POA: Insufficient documentation

## 2017-07-26 DIAGNOSIS — J45909 Unspecified asthma, uncomplicated: Secondary | ICD-10-CM | POA: Insufficient documentation

## 2017-07-26 NOTE — ED Triage Notes (Signed)
Pt reports bilateral lower back pain for several months. Pt says the pain is now going down her legs. She says she is unable to walk today. Has been taking ibuprofen and gabapentin without relief. Pt says that she has had some recent falls because of the pain.

## 2017-07-27 ENCOUNTER — Emergency Department (HOSPITAL_COMMUNITY): Payer: Self-pay

## 2017-07-27 ENCOUNTER — Emergency Department (HOSPITAL_COMMUNITY)
Admission: EM | Admit: 2017-07-27 | Discharge: 2017-07-27 | Disposition: A | Discharge status: Home / Self Care | Payer: Self-pay | Attending: Emergency Medicine | Admitting: Emergency Medicine

## 2017-07-27 DIAGNOSIS — M5440 Lumbago with sciatica, unspecified side: Secondary | ICD-10-CM

## 2017-07-27 LAB — CBC WITH DIFFERENTIAL/PLATELET
BASOS PCT: 0 %
Basophils Absolute: 0 10*3/uL (ref 0.0–0.1)
EOS ABS: 0.1 10*3/uL (ref 0.0–0.7)
Eosinophils Relative: 1 %
HEMATOCRIT: 41.7 % (ref 36.0–46.0)
HEMOGLOBIN: 12.8 g/dL (ref 12.0–15.0)
LYMPHS ABS: 2.8 10*3/uL (ref 0.7–4.0)
Lymphocytes Relative: 35 %
MCH: 25.3 pg — AB (ref 26.0–34.0)
MCHC: 30.7 g/dL (ref 30.0–36.0)
MCV: 82.4 fL (ref 78.0–100.0)
MONOS PCT: 7 %
Monocytes Absolute: 0.6 10*3/uL (ref 0.1–1.0)
NEUTROS ABS: 4.7 10*3/uL (ref 1.7–7.7)
NEUTROS PCT: 57 %
Platelets: 322 10*3/uL (ref 150–400)
RBC: 5.06 MIL/uL (ref 3.87–5.11)
RDW: 15.1 % (ref 11.5–15.5)
WBC: 8.2 10*3/uL (ref 4.0–10.5)

## 2017-07-27 LAB — URINALYSIS, ROUTINE W REFLEX MICROSCOPIC
Bilirubin Urine: NEGATIVE
GLUCOSE, UA: NEGATIVE mg/dL
Hgb urine dipstick: NEGATIVE
KETONES UR: NEGATIVE mg/dL
LEUKOCYTES UA: NEGATIVE
NITRITE: NEGATIVE
PH: 5 (ref 5.0–8.0)
Protein, ur: NEGATIVE mg/dL
Specific Gravity, Urine: 1.024 (ref 1.005–1.030)

## 2017-07-27 LAB — BASIC METABOLIC PANEL
Anion gap: 12 (ref 5–15)
BUN: 13 mg/dL (ref 6–20)
CHLORIDE: 103 mmol/L (ref 101–111)
CO2: 22 mmol/L (ref 22–32)
Calcium: 9.2 mg/dL (ref 8.9–10.3)
Creatinine, Ser: 1.01 mg/dL — ABNORMAL HIGH (ref 0.44–1.00)
GFR calc non Af Amer: >60 mL/min (ref >60)
Glucose, Bld: 142 mg/dL — ABNORMAL HIGH (ref 65–99)
POTASSIUM: 3.4 mmol/L — AB (ref 3.5–5.1)
SODIUM: 137 mmol/L (ref 135–145)

## 2017-07-27 LAB — CK: CK TOTAL: 244 U/L — AB (ref 38–234)

## 2017-07-27 LAB — PREGNANCY, URINE: Preg Test, Ur: NEGATIVE

## 2017-07-27 MED ORDER — METHYLPREDNISOLONE 4 MG PO TBPK: ORAL_TABLET | ORAL | 0 refills | Status: DC

## 2017-07-27 MED ORDER — DIAZEPAM 5 MG PO TABS
5.0000 mg | ORAL_TABLET | Freq: Once | ORAL | Status: AC
  Administered 2017-07-27: 5 mg via ORAL
  Filled 2017-07-27: qty 1

## 2017-07-27 MED ORDER — METOPROLOL TARTRATE 50 MG PO TABS
100.0000 mg | ORAL_TABLET | Freq: Once | ORAL | Status: AC
  Administered 2017-07-27: 100 mg via ORAL
  Filled 2017-07-27: qty 2

## 2017-07-27 MED ORDER — METHOCARBAMOL 500 MG PO TABS: 500.0000 mg | ORAL_TABLET | Freq: Two times a day (BID) | ORAL | 0 refills | Status: DC

## 2017-07-27 MED ORDER — OXYCODONE-ACETAMINOPHEN 5-325 MG PO TABS
1.0000 | ORAL_TABLET | Freq: Once | ORAL | Status: AC
  Administered 2017-07-27: 1 via ORAL
  Filled 2017-07-27: qty 1

## 2017-07-27 MED ORDER — KETOROLAC TROMETHAMINE 30 MG/ML IJ SOLN
60.0000 mg | Freq: Once | INTRAMUSCULAR | Status: AC
  Administered 2017-07-27: 60 mg via INTRAMUSCULAR
  Filled 2017-07-27: qty 2

## 2017-07-27 NOTE — ED Notes (Signed)
Dr Wyvonnia Dusky notified of hypertension with orthostatic BP assessment.  MD orders Metoprolol.

## 2017-07-27 NOTE — ED Notes (Signed)
Pt ambulated approximately 100 feet with complaints of spasms in her back and legs.  Dr Wyvonnia Dusky aware.

## 2017-07-27 NOTE — ED Notes (Signed)
Bladder scanned via ultrasound post void with 30 ml residual noted.  Dr Wyvonnia Dusky notified.
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
## 2017-07-27 NOTE — Discharge Instructions (Signed)
Follow-up with your doctor for an MRI of your back.  Monitor your blood sugars carefully while taking the steroids.  Return to the ED if you develop increased weakness, numbness, incontinence or any other concerns.

## 2017-07-30 ENCOUNTER — Ambulatory Visit: Payer: Self-pay | Admitting: Physician Assistant

## 2017-08-04 ENCOUNTER — Ambulatory Visit: Payer: Self-pay | Admitting: Physician Assistant

## 2017-08-04 VITALS — BP 147/98 | HR 91 | Temp 97.0°F | Ht 63.75 in | Wt 298.0 lb

## 2017-08-04 DIAGNOSIS — E785 Hyperlipidemia, unspecified: Secondary | ICD-10-CM

## 2017-08-04 DIAGNOSIS — M545 Low back pain: Secondary | ICD-10-CM

## 2017-08-04 DIAGNOSIS — G8929 Other chronic pain: Secondary | ICD-10-CM

## 2017-08-04 DIAGNOSIS — N189 Chronic kidney disease, unspecified: Secondary | ICD-10-CM

## 2017-08-04 DIAGNOSIS — Z6841 Body Mass Index (BMI) 40.0 and over, adult: Secondary | ICD-10-CM

## 2017-08-04 DIAGNOSIS — F1910 Other psychoactive substance abuse, uncomplicated: Secondary | ICD-10-CM

## 2017-08-04 DIAGNOSIS — I10 Essential (primary) hypertension: Secondary | ICD-10-CM

## 2017-08-04 DIAGNOSIS — Z1239 Encounter for other screening for malignant neoplasm of breast: Secondary | ICD-10-CM

## 2017-08-04 DIAGNOSIS — F319 Bipolar disorder, unspecified: Secondary | ICD-10-CM

## 2017-08-04 DIAGNOSIS — E118 Type 2 diabetes mellitus with unspecified complications: Secondary | ICD-10-CM

## 2017-08-04 MED ORDER — AMLODIPINE BESYLATE 5 MG PO TABS: 5.0000 mg | ORAL_TABLET | Freq: Every day | ORAL | 1 refills | Status: DC

## 2017-08-04 MED ORDER — CYCLOBENZAPRINE HCL 10 MG PO TABS: 10.0000 mg | ORAL_TABLET | Freq: Three times a day (TID) | ORAL | 0 refills | Status: DC | PRN

## 2017-08-04 NOTE — Progress Notes (Signed)
BP (!) 147/98 (BP Location: Right Arm, Patient Position: Sitting, Cuff Size: Large)   Pulse 91   Temp (!) 97 F (36.1 C) (Other (Comment))   Ht 5' 3.75" (1.619 m)   Wt 298 lb (135.2 kg)   LMP 07/11/2014 (Exact Date)   SpO2 95%   BMI 51.55 kg/m    Subjective:    Patient ID: Patricia [name], female    DOB: 1971/02/08, 47 y.o.   MRN: [id number]  HPI: Patricia [name] is a 47 y.o. female presenting on 08/04/2017 for Diabetes; Asthma; Back Pain; and Mental Health Problem   HPI   Pt states IBU and "the last thing you gave me" (?diclofenac)  Pt says her mental meds were sent to [facility] and she is waiting for them to arrive.   Pt did not bring in her meds again today.    Pt did get confused and was taking her metoprolol 4 or 5 daily because she thought it was a muscle relaxer.   Pt has applied for medicaid but she says she has no answer yet.  She thinks she might have family planning medicaid.   She is s/p TAH.  Records on back pain received from previous provider.   pt had ordered MRI in 03/26/16 but she never got it done.    Relevant past medical, surgical, family and social history reviewed and updated as indicated. Interim medical history since our last visit reviewed. Allergies and medications reviewed and updated.   Current Outpatient Medications:  .  albuterol (PROVENTIL HFA;VENTOLIN HFA) 108 (90 Base) MCG/ACT inhaler, Inhale 2 puffs into the lungs every 6 (six) hours as needed for wheezing or shortness of breath., Disp: 1 Inhaler, Rfl: 1 .  atorvastatin (LIPITOR) 20 MG tablet, Take 1 tablet (20 mg total) by mouth daily., Disp: 90 tablet, Rfl: 1 .  cloNIDine (CATAPRES) 0.3 MG tablet, Take 1 tablet (0.3 mg total) by mouth 2 (two) times daily. For high blood pressure, Disp: 180 tablet, Rfl: 0 .  diclofenac (VOLTAREN) 75 MG EC tablet, Take 1 tablet (75 mg total) by mouth 2 (two) times daily as needed., Disp: 30 tablet, Rfl: 0 .  docusate sodium (COLACE) 100 MG capsule,  Take 1 capsule (100 mg total) by mouth 2 (two) times daily as needed for mild constipation., Disp: 1 capsule, Rfl: 0 .  ibuprofen (ADVIL,MOTRIN) 800 MG tablet, Take 1 tablet (800 mg total) by mouth every 8 (eight) hours as needed., Disp: 30 tablet, Rfl: 0 .  lidocaine (LIDODERM) 5 %, Place 2 patches onto the skin daily. Remove & Discard patch within 12 hours or as directed by MD: For pain management, Disp: 20 patch, Rfl: 0 .  metFORMIN (GLUCOPHAGE) 500 MG tablet, Take 1 tablet (500 mg total) by mouth 2 (two) times daily with a meal., Disp: 180 tablet, Rfl: 1 .  metoprolol tartrate (LOPRESSOR) 100 MG tablet, Take 1 tablet (100 mg total) by mouth 2 (two) times daily. (this replaces atenolol), Disp: 180 tablet, Rfl: 1 .  montelukast (SINGULAIR) 10 MG tablet, Take 1 tablet (10 mg total) by mouth daily. For shortness of breath, Disp: 90 tablet, Rfl: 1 .  omeprazole (PRILOSEC) 20 MG capsule, Take 2 capsules (40 mg total) by mouth every evening. For acid reflux, Disp: 90 capsule, Rfl: 0 .  QUEtiapine (SEROQUEL) 400 MG tablet, Take 2 tablets (800 mg total) by mouth at bedtime. For mood control, Disp: 60 tablet, Rfl: 0 .  gabapentin (NEURONTIN) 400 MG capsule, Take 1  capsule (400 mg total) by mouth 4 (four) times daily. For agitation (Patient not taking: Reported on 08/04/2017), Disp: 120 capsule, Rfl: 0 .  hydrOXYzine (ATARAX/VISTARIL) 25 MG tablet, Take 1 tablet (25 mg total) by mouth 3 (three) times daily as needed for anxiety. (Patient not taking: Reported on 08/04/2017), Disp: 60 tablet, Rfl: 0 .  methocarbamol (ROBAXIN) 500 MG tablet, Take 1 tablet (500 mg total) by mouth 2 (two) times daily. (Patient not taking: Reported on 08/04/2017), Disp: 20 tablet, Rfl: 0 .  PARoxetine (PAXIL) 20 MG tablet, Take 1 tablet (20 mg total) by mouth daily. For depression (Patient not taking: Reported on 08/04/2017), Disp: 30 tablet, Rfl: 0 .  phenylephrine-shark liver oil-mineral oil-petrolatum (PREPARATION H) 0.25-3-14-71.9 %  rectal ointment, Place rectally 2 (two) times daily as needed for hemorrhoids. (Patient not taking: Reported on 08/04/2017), Disp: 30 g, Rfl: 0 .  traZODone (DESYREL) 50 MG tablet, Take 1 tablet (50 mg total) by mouth at bedtime as needed for sleep. (Patient not taking: Reported on 08/04/2017), Disp: 30 tablet, Rfl: 0   Review of Systems  Constitutional: Positive for chills, diaphoresis and fatigue. Negative for appetite change, fever and unexpected weight change.  HENT: Negative for congestion, drooling, ear pain, facial swelling, hearing loss, mouth sores, sneezing, sore throat, trouble swallowing and voice change.   Eyes: Negative for pain, discharge, redness, itching and visual disturbance.  Respiratory: Positive for cough and shortness of breath. Negative for choking and wheezing.   Cardiovascular: Negative for chest pain, palpitations and leg swelling.  Gastrointestinal: Negative for abdominal pain, blood in stool, constipation, diarrhea and vomiting.  Endocrine: Negative for cold intolerance, heat intolerance and polydipsia.  Genitourinary: Negative for decreased urine volume, dysuria and hematuria.  Musculoskeletal: Positive for arthralgias, back pain and gait problem.  Skin: Negative for rash.  Allergic/Immunologic: Positive for environmental allergies.  Neurological: Positive for light-headedness and headaches. Negative for seizures and syncope.  Hematological: Negative for adenopathy.  Psychiatric/Behavioral: Positive for dysphoric mood. Negative for agitation and suicidal ideas. The patient is not nervous/anxious.     Per HPI unless specifically indicated above     Objective:    BP (!) 147/98 (BP Location: Right Arm, Patient Position: Sitting, Cuff Size: Large)   Pulse 91   Temp (!) 97 F (36.1 C) (Other (Comment))   Ht 5' 3.75" (1.619 m)   Wt 298 lb (135.2 kg)   LMP 07/11/2014 (Exact Date)   SpO2 95%   BMI 51.55 kg/m   Wt Readings from Last 3 Encounters:  08/04/17 298 lb  (135.2 kg)  07/26/17 295 lb (133.8 kg)  06/25/17 293 lb 8 oz (133.1 kg)    Physical Exam  Constitutional: She is oriented to person, place, and time. She appears well-developed and well-nourished.  HENT:  Head: Normocephalic and atraumatic.  Neck: Neck supple.  Cardiovascular: Normal rate and regular rhythm.  Pulmonary/Chest: Effort normal and breath sounds normal.  Abdominal: Soft. Bowel sounds are normal. She exhibits no mass. There is no hepatosplenomegaly. There is no tenderness.  Musculoskeletal: She exhibits no edema.  Lymphadenopathy:    She has no cervical adenopathy.  Neurological: She is alert and oriented to person, place, and time.  Skin: Skin is warm and dry.  Psychiatric: She has a normal mood and affect. Her behavior is normal.  Vitals reviewed.       Assessment & Plan:    Encounter Diagnoses  Name Primary?  . Controlled diabetes mellitus type 2 with complications, unspecified whether long term  insulin use ([place]) Yes  . Essential hypertension   . Hyperlipidemia, unspecified hyperlipidemia type   . Screening for breast cancer   . Chronic kidney disease, unspecified CKD stage   . Polysubstance abuse ([place])   . Bipolar affective disorder, remission status unspecified ([place])   . Chronic low back pain, unspecified back pain laterality, with sciatica presence unspecified   . Morbid obesity ([place])   . BMI 50.0-59.9, adult ([place])     -discussed DM with pt. Metformin likely all she will need.  Ok if she doesn't check bs daily -order screening mammogram -pt to continue clonidine and metoprolol and add amlodipine 5mg  -pt to continue lipitor for cholesterol -Order MRI for back pain -pt was given [facility] discount application -refer for DM eye exam -will rx flexeril for short term.  Discussed with pt that [facility] cannot write narcotics nor manage chronic pain -pt to F/u 5 wk to recheck bp and review mri.  RTO sooner prn

## 2017-08-19 ENCOUNTER — Ambulatory Visit (HOSPITAL_COMMUNITY)
Admission: RE | Admit: 2017-08-19 | Discharge: 2017-08-19 | Disposition: A | Discharge status: Home / Self Care | Payer: Self-pay | Source: Ambulatory Visit | Attending: Physician Assistant | Admitting: Physician Assistant

## 2017-08-19 ENCOUNTER — Other Ambulatory Visit: Payer: Self-pay | Admitting: Physician Assistant

## 2017-08-19 ENCOUNTER — Ambulatory Visit (HOSPITAL_COMMUNITY): Payer: Self-pay

## 2017-08-19 DIAGNOSIS — E882 Lipomatosis, not elsewhere classified: Secondary | ICD-10-CM | POA: Insufficient documentation

## 2017-08-19 DIAGNOSIS — M545 Low back pain: Secondary | ICD-10-CM | POA: Insufficient documentation

## 2017-08-19 DIAGNOSIS — G8929 Other chronic pain: Secondary | ICD-10-CM | POA: Insufficient documentation

## 2017-08-20 ENCOUNTER — Ambulatory Visit: Payer: Self-pay | Admitting: Physician Assistant

## 2017-08-21 ENCOUNTER — Ambulatory Visit: Payer: Medicaid Other | Admitting: Physician Assistant

## 2017-08-21 ENCOUNTER — Encounter: Payer: Self-pay | Admitting: Physician Assistant

## 2017-08-21 VITALS — BP 108/72 | HR 91 | Temp 97.7°F | Ht 63.75 in | Wt 295.0 lb

## 2017-08-21 DIAGNOSIS — L089 Local infection of the skin and subcutaneous tissue, unspecified: Secondary | ICD-10-CM

## 2017-08-21 DIAGNOSIS — T148XXA Other injury of unspecified body region, initial encounter: Principal | ICD-10-CM

## 2017-08-21 MED ORDER — CEPHALEXIN 500 MG PO CAPS: 500.0000 mg | ORAL_CAPSULE | Freq: Four times a day (QID) | ORAL | 0 refills | Status: DC
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
## 2017-08-21 NOTE — Patient Instructions (Signed)
Wound Infection A wound infection happens when germs start to grow in the wound. Germs that cause wound infections are most commonly bacteria. Other types of infections can occur as well. In some cases, infection can cause the wound to break open. Wound infections need treatment. If a wound infection is left untreated, complications can occur. This may include an infection in your bloodstream (sepsis) or in a bone (osteomyelitis). What are the causes? This condition is caused by germs growing in the wound. What increases the risk? The following factors may make you more likely to develop this condition:  Having a weak body defense system (immune system).  Having diabetes.  Taking steroid medicines for a long time (chronic use).  Smoking.  Older age.  Being overweight.  What are the signs or symptoms? Symptoms of this condition include:  Having more redness, swelling, or pain at the wound site.  Having more blood, pus, or fluid at the wound site.  A bad smell coming from a wound or bandage (dressing).  Having a fever.  Feeling tired or fatigued.  How is this diagnosed? This condition is diagnosed with a medical history and physical exam. You may also have blood tests. How is this treated? This condition is treated with an antibiotic medicine. The infection should improve 24-48 hours after you start antibiotics. Any redness around the wound should stop spreading, and the wound should be less painful. Follow these instructions at home: Medicines  Take or apply over-the-counter and prescription medicines only as told by your health care provider.  If you were prescribed antibiotic medicine, take or apply it as told by your health care provider. Do not stop using the antibiotic even if your condition improves. Wound care  Clean the wound each day or as told by your health care provider. ? Wash the wound with mild soap and water. ? Rinse the wound with water to remove all  soap. ? Pat the wound dry with a clean towel. Do not rub it.  Follow instructions from your health care provider about how to take care of your wound. Make sure you: ? Wash your hands with soap and water before you change your dressing. If soap and water are not available, use hand sanitizer. ? Change your dressing as told by your health care provider. ? Leave stitches (sutures), skin glue, or adhesive strips in place, if this applies. These skin closures may need to stay in place for 2 weeks or longer. If adhesive strip edges start to loosen and curl up, you may trim the loose edges. Do not remove adhesive strips completely unless your health care provider tells you to do that. Some wounds are left open to heal on their own.  Check your wound every day for signs of infection. Watch for: ? More redness, swelling, or pain. ? More fluid or blood. ? Warmth. ? Pus or a bad smell. General instructions  Keep the dressing dry until your health care provider says it can be removed.  Do not take baths, swim, use a hot tub, or do anything that would put your wound underwater until your health care provider approves.  Raise (elevate) the injured area above the level of your heart while you are sitting or lying down.  Do not scratch or pick at the wound.  Keep all follow-up visits as told by your health care provider. This is important. Contact a health care provider if:  Your pain is not controlled with medicine.  You have more   redness, swelling, or pain around your wound.  You have more fluid or blood coming from your wound.  Your wound feels warm to the touch.  You have pus coming from your wound.  You continue to notice a bad smell coming from your wound or your dressing.  Your wound that was closed breaks open. Get help right away if:  You have a red streak going away from your wound.  You have a fever. This information is not intended to replace advice given to you by your  health care provider. Make sure you discuss any questions you have with your health care provider. Document Released: 04/13/2003 Document Revised: 12/27/2015 Document Reviewed: 01/02/2015 Elsevier Interactive Patient Education  2018 [employer]  

## 2017-08-22 ENCOUNTER — Emergency Department (HOSPITAL_COMMUNITY)
Admission: EM | Admit: 2017-08-22 | Discharge: 2017-08-22 | Disposition: A | Discharge status: Home / Self Care | Payer: Medicaid Other | Attending: Emergency Medicine | Admitting: Emergency Medicine

## 2017-08-22 ENCOUNTER — Other Ambulatory Visit: Payer: Self-pay

## 2017-08-22 ENCOUNTER — Encounter (HOSPITAL_COMMUNITY): Payer: Self-pay

## 2017-08-22 DIAGNOSIS — T43591A Poisoning by other antipsychotics and neuroleptics, accidental (unintentional), initial encounter: Secondary | ICD-10-CM | POA: Insufficient documentation

## 2017-08-22 DIAGNOSIS — Z79899 Other long term (current) drug therapy: Secondary | ICD-10-CM | POA: Insufficient documentation

## 2017-08-22 DIAGNOSIS — E119 Type 2 diabetes mellitus without complications: Secondary | ICD-10-CM | POA: Insufficient documentation

## 2017-08-22 DIAGNOSIS — T50901A Poisoning by unspecified drugs, medicaments and biological substances, accidental (unintentional), initial encounter: Secondary | ICD-10-CM

## 2017-08-22 DIAGNOSIS — F319 Bipolar disorder, unspecified: Secondary | ICD-10-CM | POA: Insufficient documentation

## 2017-08-22 DIAGNOSIS — F329 Major depressive disorder, single episode, unspecified: Secondary | ICD-10-CM | POA: Insufficient documentation

## 2017-08-22 DIAGNOSIS — Z7984 Long term (current) use of oral hypoglycemic drugs: Secondary | ICD-10-CM | POA: Insufficient documentation

## 2017-08-22 DIAGNOSIS — T43201A Poisoning by unspecified antidepressants, accidental (unintentional), initial encounter: Secondary | ICD-10-CM | POA: Insufficient documentation

## 2017-08-22 DIAGNOSIS — I10 Essential (primary) hypertension: Secondary | ICD-10-CM | POA: Insufficient documentation

## 2017-08-22 DIAGNOSIS — Z87891 Personal history of nicotine dependence: Secondary | ICD-10-CM | POA: Insufficient documentation

## 2017-08-22 DIAGNOSIS — J45909 Unspecified asthma, uncomplicated: Secondary | ICD-10-CM | POA: Insufficient documentation

## 2017-08-22 NOTE — ED Triage Notes (Signed)
Pt took 2 400 mg seroquel and 2 100 mg trazodone tonight, states her meds were changed recently and she states she forgot she took both instead of just one of them

## 2017-08-22 NOTE — ED Notes (Addendum)
Per Poison Control, monitor patient until she is asymptomatic. EDP [place] informed.
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
## 2017-08-22 NOTE — Discharge Instructions (Signed)
Continue your medications as previously prescribed.  Follow-up with your primary doctor in the next week to discuss your medications.

## 2017-08-25 ENCOUNTER — Other Ambulatory Visit: Payer: Self-pay | Admitting: Physician Assistant

## 2017-08-28 ENCOUNTER — Other Ambulatory Visit: Payer: Self-pay | Admitting: Physician Assistant

## 2017-08-28 DIAGNOSIS — Z1231 Encounter for screening mammogram for malignant neoplasm of breast: Secondary | ICD-10-CM

## 2017-09-08 ENCOUNTER — Ambulatory Visit: Payer: Medicaid Other | Admitting: Physician Assistant

## 2017-09-08 ENCOUNTER — Other Ambulatory Visit: Payer: Self-pay | Admitting: Physician Assistant

## 2017-09-08 ENCOUNTER — Encounter: Payer: Self-pay | Admitting: Physician Assistant

## 2017-09-08 VITALS — BP 133/90 | HR 83 | Temp 97.2°F | Ht 63.75 in | Wt 298.5 lb

## 2017-09-08 DIAGNOSIS — R0602 Shortness of breath: Secondary | ICD-10-CM

## 2017-09-08 DIAGNOSIS — Z6841 Body Mass Index (BMI) 40.0 and over, adult: Secondary | ICD-10-CM

## 2017-09-08 DIAGNOSIS — Z8669 Personal history of other diseases of the nervous system and sense organs: Secondary | ICD-10-CM

## 2017-09-08 DIAGNOSIS — E118 Type 2 diabetes mellitus with unspecified complications: Secondary | ICD-10-CM

## 2017-09-08 DIAGNOSIS — I10 Essential (primary) hypertension: Secondary | ICD-10-CM

## 2017-09-08 DIAGNOSIS — M545 Low back pain: Secondary | ICD-10-CM

## 2017-09-08 DIAGNOSIS — F319 Bipolar disorder, unspecified: Secondary | ICD-10-CM

## 2017-09-08 DIAGNOSIS — R400 Somnolence: Secondary | ICD-10-CM

## 2017-09-08 DIAGNOSIS — R062 Wheezing: Secondary | ICD-10-CM

## 2017-09-08 DIAGNOSIS — N189 Chronic kidney disease, unspecified: Secondary | ICD-10-CM

## 2017-09-08 DIAGNOSIS — G8929 Other chronic pain: Secondary | ICD-10-CM

## 2017-09-08 DIAGNOSIS — F1910 Other psychoactive substance abuse, uncomplicated: Secondary | ICD-10-CM

## 2017-09-08 DIAGNOSIS — E785 Hyperlipidemia, unspecified: Secondary | ICD-10-CM

## 2017-09-08 MED ORDER — DICLOFENAC SODIUM 75 MG PO TBEC: 75.0000 mg | DELAYED_RELEASE_TABLET | Freq: Two times a day (BID) | ORAL | 0 refills | Status: DC | PRN

## 2017-09-08 MED ORDER — ALBUTEROL SULFATE (2.5 MG/3ML) 0.083% IN NEBU
2.5000 mg | INHALATION_SOLUTION | Freq: Once | RESPIRATORY_TRACT | Status: AC
  Administered 2017-09-08: 2.5 mg via RESPIRATORY_TRACT

## 2017-09-08 NOTE — Progress Notes (Signed)
BP 133/90 (BP Location: Right Arm, Patient Position: Sitting, Cuff Size: Large)   Pulse 83   Temp (!) 97.2 F (36.2 C)   Ht 5' 3.75" (1.619 m)   Wt 298 lb 8 oz (135.4 kg)   LMP 07/11/2014 (Exact Date)   SpO2 99%   BMI 51.64 kg/m    Subjective:    Patient ID: Patricia [name], female    DOB: [birth year], 47 y.o.   MRN: [id number]  HPI: [name] is a 47 y.o. female presenting on 09/08/2017 for Hypertension and Back Pain   HPI   Chief Complaint  Patient presents with  . Hypertension  . Back Pain     Pt moved in to a residence on Saturday and is no longer living in her car  Pt still going to [facility] for [place] issues  Pt did not turn in charity care application,  Relevant past medical, surgical, family and social history reviewed and updated as indicated. Interim medical history since our last visit reviewed. Allergies and medications reviewed and updated.   Current Outpatient Medications:  .  albuterol (PROVENTIL HFA;VENTOLIN HFA) 108 (90 Base) MCG/ACT inhaler, Inhale 2 puffs into the lungs every 6 (six) hours as needed for wheezing or shortness of breath., Disp: 1 Inhaler, Rfl: 1 .  amLODipine (NORVASC) 5 MG tablet, Take 1 tablet (5 mg total) by mouth daily., Disp: 90 tablet, Rfl: 1 .  atorvastatin (LIPITOR) 20 MG tablet, Take 1 tablet (20 mg total) by mouth daily., Disp: 90 tablet, Rfl: 1 .  cloNIDine (CATAPRES) 0.3 MG tablet, Take 1 tablet (0.3 mg total) by mouth 2 (two) times daily. For high blood pressure, Disp: 180 tablet, Rfl: 0 .  cyclobenzaprine (FLEXERIL) 10 MG tablet, Take 1 tablet (10 mg total) by mouth 3 (three) times daily as needed for muscle spasms., Disp: 30 tablet, Rfl: 0 .  docusate sodium (COLACE) 100 MG capsule, Take 1 capsule (100 mg total) by mouth 2 (two) times daily as needed for mild constipation., Disp: 1 capsule, Rfl: 0 .  gabapentin (NEURONTIN) 400 MG capsule, Take 1 capsule (400 mg total) by mouth 4 (four) times daily. For agitation  (Patient taking differently: Take 400 mg by mouth 3 (three) times daily. For agitation), Disp: 120 capsule, Rfl: 0 .  lidocaine (LIDODERM) 5 %, Place 2 patches onto the skin daily. Remove & Discard patch within 12 hours or as directed by MD: For pain management, Disp: 20 patch, Rfl: 0 .  metFORMIN (GLUCOPHAGE) 500 MG tablet, Take 1 tablet (500 mg total) by mouth 2 (two) times daily with a meal., Disp: 180 tablet, Rfl: 1 .  metoprolol tartrate (LOPRESSOR) 100 MG tablet, Take 1 tablet (100 mg total) by mouth 2 (two) times daily. (this replaces atenolol), Disp: 180 tablet, Rfl: 1 .  montelukast (SINGULAIR) 10 MG tablet, Take 1 tablet (10 mg total) by mouth daily. For shortness of breath, Disp: 90 tablet, Rfl: 1 .  omeprazole (PRILOSEC) 40 MG capsule, TAKE 1 Capsule BY MOUTH EVERY EVENING, Disp: 90 capsule, Rfl: 1 .  phenylephrine-shark liver oil-mineral oil-petrolatum (PREPARATION H) 0.25-3-14-71.9 % rectal ointment, Place rectally 2 (two) times daily as needed for hemorrhoids., Disp: 30 g, Rfl: 0 .  QUEtiapine (SEROQUEL) 400 MG tablet, Take 2 tablets (800 mg total) by mouth at bedtime. For mood control, Disp: 60 tablet, Rfl: 0 .  traZODone (DESYREL) 50 MG tablet, Take 1 tablet (50 mg total) by mouth at bedtime as needed for sleep. (Patient taking differently:  Take 200 mg by mouth daily after supper. ), Disp: 30 tablet, Rfl: 0 .  diclofenac (VOLTAREN) 75 MG EC tablet, Take 1 tablet (75 mg total) by mouth 2 (two) times daily as needed. (Patient not taking: Reported on 09/08/2017), Disp: 30 tablet, Rfl: 0 .  hydrOXYzine (ATARAX/VISTARIL) 25 MG tablet, Take 1 tablet (25 mg total) by mouth 3 (three) times daily as needed for anxiety. (Patient not taking: Reported on 09/08/2017), Disp: 60 tablet, Rfl: 0 .  ibuprofen (ADVIL,MOTRIN) 800 MG tablet, Take 1 tablet (800 mg total) by mouth every 8 (eight) hours as needed. (Patient not taking: Reported on 08/21/2017), Disp: 30 tablet, Rfl: 0 .  methocarbamol (ROBAXIN) 500  MG tablet, Take 1 tablet (500 mg total) by mouth 2 (two) times daily. (Patient not taking: Reported on 08/04/2017), Disp: 20 tablet, Rfl: 0   Review of Systems  Per HPI unless specifically indicated above     Objective:    BP 133/90 (BP Location: Right Arm, Patient Position: Sitting, Cuff Size: Large)   Pulse 83   Temp (!) 97.2 F (36.2 C)   Ht 5' 3.75" (1.619 m)   Wt 298 lb 8 oz (135.4 kg)   LMP 07/11/2014 (Exact Date)   SpO2 99%   BMI 51.64 kg/m   Wt Readings from Last 3 Encounters:  09/08/17 298 lb 8 oz (135.4 kg)  08/21/17 295 lb (133.8 kg)  08/04/17 298 lb (135.2 kg)    Physical Exam  Constitutional: She is oriented to person, place, and time. Vital signs are normal. She appears well-developed and well-nourished. She is sleeping. She is easily aroused.  HENT:  Head: Normocephalic and atraumatic.  Neck: Neck supple.  Cardiovascular: Normal rate and regular rhythm.  Pulmonary/Chest: Effort normal and breath sounds normal.  Abdominal: Soft. Bowel sounds are normal. She exhibits no mass. There is no hepatosplenomegaly. There is no tenderness.  Musculoskeletal: She exhibits no edema.  Lymphadenopathy:    She has no cervical adenopathy.  Neurological: She is alert, oriented to person, place, and time and easily aroused.  Skin: Skin is warm and dry.  Psychiatric: She has a normal mood and affect. Her behavior is normal.  Vitals reviewed.       Assessment & Plan:    Encounter Diagnoses  Name Primary?  . Essential hypertension Yes  . Chronic low back pain, unspecified back pain laterality, with sciatica presence unspecified   . SOB (shortness of breath)   . Morbid obesity ([place])   . Somnolence   . History of sleep apnea   . Chronic kidney disease, unspecified CKD stage   . Polysubstance abuse ([place])   . Hyperlipidemia, unspecified hyperlipidemia type   . Bipolar affective disorder, remission status unspecified ([place])   . BMI 50.0-59.9, adult ([place])   . Wheezing   .  Controlled diabetes mellitus type 2 with complications, unspecified whether long term insulin use ([facility])     -pt to Continue current meds for BP -Reviewed results of MRI with pt -Refer to orthopedics for back pain -discussed with pt need for weight loss to help back pain -pt was given refill of diclofenac.  She was told she does not need flexeril which is sedating in light of her somolence.  Also reminded her that we discussed that [facility] is not a pain clinic for chronic pain control -Gave pt another charity care application -Refer for sleep study  -pt to follow up here 2 months.  RTO sooner prn

## 2017-09-09 ENCOUNTER — Other Ambulatory Visit: Payer: Self-pay | Admitting: Physician Assistant

## 2017-09-09 MED ORDER — CLONIDINE HCL 0.3 MG PO TABS: 0.3000 mg | ORAL_TABLET | Freq: Two times a day (BID) | ORAL | 0 refills | Status: DC

## 2017-09-09 MED ORDER — ALBUTEROL SULFATE HFA 108 (90 BASE) MCG/ACT IN AERS: 2.0000 | INHALATION_SPRAY | Freq: Four times a day (QID) | RESPIRATORY_TRACT | 0 refills | Status: DC | PRN

## 2017-09-12 ENCOUNTER — Encounter (HOSPITAL_COMMUNITY): Payer: Self-pay

## 2017-09-12 ENCOUNTER — Inpatient Hospital Stay (HOSPITAL_COMMUNITY)
Admission: EM | Admit: 2017-09-12 | Discharge: 2017-09-15 | DRG: 194 | Disposition: A | Discharge status: Home / Self Care | Payer: Self-pay | Attending: Family Medicine | Admitting: Family Medicine

## 2017-09-12 ENCOUNTER — Emergency Department (HOSPITAL_COMMUNITY): Payer: Self-pay

## 2017-09-12 DIAGNOSIS — Z7984 Long term (current) use of oral hypoglycemic drugs: Secondary | ICD-10-CM

## 2017-09-12 DIAGNOSIS — E669 Obesity, unspecified: Secondary | ICD-10-CM | POA: Diagnosis present

## 2017-09-12 DIAGNOSIS — E1169 Type 2 diabetes mellitus with other specified complication: Secondary | ICD-10-CM | POA: Diagnosis present

## 2017-09-12 DIAGNOSIS — J45909 Unspecified asthma, uncomplicated: Secondary | ICD-10-CM | POA: Diagnosis present

## 2017-09-12 DIAGNOSIS — Z87891 Personal history of nicotine dependence: Secondary | ICD-10-CM

## 2017-09-12 DIAGNOSIS — G4733 Obstructive sleep apnea (adult) (pediatric): Secondary | ICD-10-CM | POA: Diagnosis present

## 2017-09-12 DIAGNOSIS — F319 Bipolar disorder, unspecified: Secondary | ICD-10-CM | POA: Diagnosis present

## 2017-09-12 DIAGNOSIS — J42 Unspecified chronic bronchitis: Secondary | ICD-10-CM | POA: Diagnosis present

## 2017-09-12 DIAGNOSIS — I10 Essential (primary) hypertension: Secondary | ICD-10-CM | POA: Diagnosis present

## 2017-09-12 DIAGNOSIS — F1210 Cannabis abuse, uncomplicated: Secondary | ICD-10-CM | POA: Diagnosis present

## 2017-09-12 DIAGNOSIS — E876 Hypokalemia: Secondary | ICD-10-CM | POA: Diagnosis present

## 2017-09-12 DIAGNOSIS — J189 Pneumonia, unspecified organism: Secondary | ICD-10-CM

## 2017-09-12 DIAGNOSIS — E1141 Type 2 diabetes mellitus with diabetic mononeuropathy: Secondary | ICD-10-CM | POA: Diagnosis present

## 2017-09-12 DIAGNOSIS — R079 Chest pain, unspecified: Secondary | ICD-10-CM

## 2017-09-12 DIAGNOSIS — Z6841 Body Mass Index (BMI) 40.0 and over, adult: Secondary | ICD-10-CM

## 2017-09-12 DIAGNOSIS — N179 Acute kidney failure, unspecified: Secondary | ICD-10-CM | POA: Diagnosis present

## 2017-09-12 DIAGNOSIS — F411 Generalized anxiety disorder: Secondary | ICD-10-CM | POA: Diagnosis present

## 2017-09-12 DIAGNOSIS — Z91010 Allergy to peanuts: Secondary | ICD-10-CM

## 2017-09-12 DIAGNOSIS — Z9049 Acquired absence of other specified parts of digestive tract: Secondary | ICD-10-CM

## 2017-09-12 DIAGNOSIS — F209 Schizophrenia, unspecified: Secondary | ICD-10-CM | POA: Diagnosis present

## 2017-09-12 DIAGNOSIS — J181 Lobar pneumonia, unspecified organism: Principal | ICD-10-CM | POA: Diagnosis present

## 2017-09-12 DIAGNOSIS — K5909 Other constipation: Secondary | ICD-10-CM | POA: Diagnosis present

## 2017-09-12 DIAGNOSIS — Z79899 Other long term (current) drug therapy: Secondary | ICD-10-CM

## 2017-09-12 LAB — CBC WITH DIFFERENTIAL/PLATELET
BASOS PCT: 0 %
Basophils Absolute: 0 10*3/uL (ref 0.0–0.1)
EOS ABS: 0 10*3/uL (ref 0.0–0.7)
EOS PCT: 0 %
HCT: 37 % (ref 36.0–46.0)
HEMOGLOBIN: 11.7 g/dL — AB (ref 12.0–15.0)
LYMPHS ABS: 2.4 10*3/uL (ref 0.7–4.0)
Lymphocytes Relative: 19 %
MCH: 25.7 pg — ABNORMAL LOW (ref 26.0–34.0)
MCHC: 31.6 g/dL (ref 30.0–36.0)
MCV: 81.3 fL (ref 78.0–100.0)
MONO ABS: 1 10*3/uL (ref 0.1–1.0)
MONOS PCT: 8 %
Neutro Abs: 9 10*3/uL — ABNORMAL HIGH (ref 1.7–7.7)
Neutrophils Relative %: 73 %
Platelets: 297 10*3/uL (ref 150–400)
RBC: 4.55 MIL/uL (ref 3.87–5.11)
RDW: 14.8 % (ref 11.5–15.5)
WBC: 12.3 10*3/uL — ABNORMAL HIGH (ref 4.0–10.5)

## 2017-09-12 LAB — BASIC METABOLIC PANEL
Anion gap: 11 (ref 5–15)
BUN: 10 mg/dL (ref 6–20)
CALCIUM: 9.2 mg/dL (ref 8.9–10.3)
CHLORIDE: 104 mmol/L (ref 101–111)
CO2: 22 mmol/L (ref 22–32)
CREATININE: 1.08 mg/dL — AB (ref 0.44–1.00)
Glucose, Bld: 169 mg/dL — ABNORMAL HIGH (ref 65–99)
Potassium: 3.4 mmol/L — ABNORMAL LOW (ref 3.5–5.1)
SODIUM: 137 mmol/L (ref 135–145)

## 2017-09-12 LAB — URINALYSIS, ROUTINE W REFLEX MICROSCOPIC
BACTERIA UA: NONE SEEN
BILIRUBIN URINE: NEGATIVE
Glucose, UA: NEGATIVE mg/dL
HGB URINE DIPSTICK: NEGATIVE
Ketones, ur: NEGATIVE mg/dL
NITRITE: NEGATIVE
PROTEIN: NEGATIVE mg/dL
SPECIFIC GRAVITY, URINE: 1.017 (ref 1.005–1.030)
pH: 6 (ref 5.0–8.0)

## 2017-09-12 LAB — MAGNESIUM: MAGNESIUM: 2 mg/dL (ref 1.7–2.4)

## 2017-09-12 LAB — TROPONIN I

## 2017-09-12 LAB — CBG MONITORING, ED: Glucose-Capillary: 147 mg/dL — ABNORMAL HIGH (ref 65–99)

## 2017-09-12 LAB — D-DIMER, QUANTITATIVE: D-Dimer, Quant: 0.32 ug/mL-FEU (ref 0.00–0.50)

## 2017-09-12 MED ORDER — HYDROCODONE-ACETAMINOPHEN 5-325 MG PO TABS
2.0000 | ORAL_TABLET | Freq: Once | ORAL | Status: AC
Start: 2017-09-12 — End: 2017-09-12
  Administered 2017-09-12: 2 via ORAL
  Filled 2017-09-12: qty 2

## 2017-09-12 MED ORDER — DIPHENHYDRAMINE HCL 12.5 MG/5ML PO ELIX
12.5000 mg | ORAL_SOLUTION | Freq: Once | ORAL | Status: AC
  Administered 2017-09-12: 12.5 mg via ORAL
  Filled 2017-09-12: qty 5

## 2017-09-12 MED ORDER — ALBUTEROL SULFATE HFA 108 (90 BASE) MCG/ACT IN AERS
INHALATION_SPRAY | RESPIRATORY_TRACT | Status: AC
  Filled 2017-09-12: qty 6.7

## 2017-09-12 MED ORDER — ALBUTEROL SULFATE (2.5 MG/3ML) 0.083% IN NEBU
2.5000 mg | INHALATION_SOLUTION | Freq: Once | RESPIRATORY_TRACT | Status: AC
Start: 2017-09-12 — End: 2017-09-12
  Administered 2017-09-12: 2.5 mg via RESPIRATORY_TRACT
  Filled 2017-09-12: qty 3

## 2017-09-12 MED ORDER — ACETAMINOPHEN 500 MG PO TABS: 1000.0000 mg | ORAL_TABLET | Freq: Once | ORAL | Status: DC

## 2017-09-12 MED ORDER — ACETAMINOPHEN 325 MG PO TABS
650.0000 mg | ORAL_TABLET | Freq: Once | ORAL | Status: AC | PRN
  Administered 2017-09-12: 650 mg via ORAL
  Filled 2017-09-12: qty 2

## 2017-09-12 MED ORDER — MORPHINE SULFATE (PF) 4 MG/ML IV SOLN
4.0000 mg | Freq: Once | INTRAVENOUS | Status: AC
  Administered 2017-09-12: 4 mg via INTRAVENOUS
  Filled 2017-09-12: qty 1

## 2017-09-12 MED ORDER — SODIUM CHLORIDE 0.9 % IV SOLN
1.0000 g | Freq: Once | INTRAVENOUS | Status: AC
  Administered 2017-09-12: 1 g via INTRAVENOUS
  Filled 2017-09-12: qty 10

## 2017-09-12 MED ORDER — SODIUM CHLORIDE 0.9 % IV SOLN
500.0000 mg | Freq: Once | INTRAVENOUS | Status: AC
  Administered 2017-09-12: 500 mg via INTRAVENOUS
  Filled 2017-09-12: qty 500

## 2017-09-12 MED ORDER — ONDANSETRON HCL 4 MG/2ML IJ SOLN
4.0000 mg | Freq: Once | INTRAMUSCULAR | Status: AC
  Administered 2017-09-12: 4 mg via INTRAVENOUS
  Filled 2017-09-12: qty 2

## 2017-09-12 MED ORDER — ASPIRIN 81 MG PO CHEW
324.0000 mg | CHEWABLE_TABLET | Freq: Once | ORAL | Status: AC
Start: 2017-09-12 — End: 2017-09-12
  Administered 2017-09-12: 324 mg via ORAL
  Filled 2017-09-12: qty 4

## 2017-09-12 MED ORDER — ALBUTEROL SULFATE HFA 108 (90 BASE) MCG/ACT IN AERS
2.0000 | INHALATION_SPRAY | Freq: Once | RESPIRATORY_TRACT | Status: AC
  Administered 2017-09-12: 2 via RESPIRATORY_TRACT
  Filled 2017-09-12: qty 6.7

## 2017-09-12 MED ORDER — CYCLOBENZAPRINE HCL 10 MG PO TABS
10.0000 mg | ORAL_TABLET | Freq: Once | ORAL | Status: AC
  Administered 2017-09-12: 10 mg via ORAL
  Filled 2017-09-12: qty 1

## 2017-09-12 MED ORDER — DEXAMETHASONE SODIUM PHOSPHATE 10 MG/ML IJ SOLN
10.0000 mg | Freq: Once | INTRAMUSCULAR | Status: AC
  Administered 2017-09-12: 10 mg via INTRAMUSCULAR
  Filled 2017-09-12: qty 1
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
## 2017-09-12 NOTE — ED Triage Notes (Signed)
Pt reports that she didn't feel well yesterday, but woke up at 4am with body aches and fever.

## 2017-09-13 ENCOUNTER — Encounter (HOSPITAL_COMMUNITY): Payer: Self-pay | Admitting: Family Medicine

## 2017-09-13 ENCOUNTER — Inpatient Hospital Stay (HOSPITAL_COMMUNITY): Payer: Self-pay

## 2017-09-13 DIAGNOSIS — E1169 Type 2 diabetes mellitus with other specified complication: Secondary | ICD-10-CM

## 2017-09-13 DIAGNOSIS — J189 Pneumonia, unspecified organism: Secondary | ICD-10-CM

## 2017-09-13 DIAGNOSIS — N179 Acute kidney failure, unspecified: Secondary | ICD-10-CM

## 2017-09-13 DIAGNOSIS — J181 Lobar pneumonia, unspecified organism: Secondary | ICD-10-CM

## 2017-09-13 DIAGNOSIS — E669 Obesity, unspecified: Secondary | ICD-10-CM

## 2017-09-13 DIAGNOSIS — F3130 Bipolar disorder, current episode depressed, mild or moderate severity, unspecified: Secondary | ICD-10-CM

## 2017-09-13 DIAGNOSIS — F200 Paranoid schizophrenia: Secondary | ICD-10-CM

## 2017-09-13 DIAGNOSIS — G4733 Obstructive sleep apnea (adult) (pediatric): Secondary | ICD-10-CM

## 2017-09-13 HISTORY — DX: Pneumonia, unspecified organism: J18.9

## 2017-09-13 LAB — COMPREHENSIVE METABOLIC PANEL
ALBUMIN: 3.6 g/dL (ref 3.5–5.0)
ALT: 27 U/L (ref 14–54)
AST: 28 U/L (ref 15–41)
Alkaline Phosphatase: 60 U/L (ref 38–126)
Anion gap: 13 (ref 5–15)
BUN: 11 mg/dL (ref 6–20)
CHLORIDE: 107 mmol/L (ref 101–111)
CO2: 18 mmol/L — AB (ref 22–32)
CREATININE: 0.9 mg/dL (ref 0.44–1.00)
Calcium: 8.8 mg/dL — ABNORMAL LOW (ref 8.9–10.3)
GFR calc non Af Amer: >60 mL/min (ref >60)
Glucose, Bld: 312 mg/dL — ABNORMAL HIGH (ref 65–99)
Potassium: 4.2 mmol/L (ref 3.5–5.1)
SODIUM: 138 mmol/L (ref 135–145)
Total Bilirubin: 0.3 mg/dL (ref 0.3–1.2)
Total Protein: 7.6 g/dL (ref 6.5–8.1)

## 2017-09-13 LAB — CBC WITH DIFFERENTIAL/PLATELET
BASOS ABS: 0 10*3/uL (ref 0.0–0.1)
BASOS PCT: 0 %
EOS ABS: 0 10*3/uL (ref 0.0–0.7)
EOS PCT: 0 %
HCT: 35.8 % — ABNORMAL LOW (ref 36.0–46.0)
Hemoglobin: 10.9 g/dL — ABNORMAL LOW (ref 12.0–15.0)
Lymphocytes Relative: 9 %
Lymphs Abs: 0.9 10*3/uL (ref 0.7–4.0)
MCH: 25 pg — AB (ref 26.0–34.0)
MCHC: 30.4 g/dL (ref 30.0–36.0)
MCV: 82.1 fL (ref 78.0–100.0)
Monocytes Absolute: 0.4 10*3/uL (ref 0.1–1.0)
Monocytes Relative: 3 %
Neutro Abs: 8.9 10*3/uL — ABNORMAL HIGH (ref 1.7–7.7)
Neutrophils Relative %: 88 %
Platelets: 298 10*3/uL (ref 150–400)
RBC: 4.36 MIL/uL (ref 3.87–5.11)
RDW: 15 % (ref 11.5–15.5)
WBC: 10.2 10*3/uL (ref 4.0–10.5)

## 2017-09-13 LAB — GLUCOSE, CAPILLARY
GLUCOSE-CAPILLARY: 293 mg/dL — AB (ref 65–99)
GLUCOSE-CAPILLARY: 293 mg/dL — AB (ref 65–99)
Glucose-Capillary: 227 mg/dL — ABNORMAL HIGH (ref 65–99)
Glucose-Capillary: 194 mg/dL — ABNORMAL HIGH (ref 65–99)

## 2017-09-13 LAB — RAPID URINE DRUG SCREEN, HOSP PERFORMED
AMPHETAMINES: NOT DETECTED
Barbiturates: NOT DETECTED
Benzodiazepines: NOT DETECTED
Cocaine: NOT DETECTED
OPIATES: NOT DETECTED
TETRAHYDROCANNABINOL: POSITIVE — AB

## 2017-09-13 LAB — INFLUENZA PANEL BY PCR (TYPE A & B)
INFLBPCR: NEGATIVE
Influenza A By PCR: NEGATIVE

## 2017-09-13 LAB — PHOSPHORUS: PHOSPHORUS: 3 mg/dL (ref 2.5–4.6)

## 2017-09-13 LAB — STREP PNEUMONIAE URINARY ANTIGEN: STREP PNEUMO URINARY ANTIGEN: NEGATIVE

## 2017-09-13 MED ORDER — TRAZODONE HCL 50 MG PO TABS
50.0000 mg | ORAL_TABLET | Freq: Every day | ORAL | Status: DC
  Administered 2017-09-13 – 2017-09-14 (×2): 50 mg via ORAL
  Filled 2017-09-13 (×2): qty 1

## 2017-09-13 MED ORDER — INSULIN ASPART 100 UNIT/ML ~~LOC~~ SOLN
0.0000 [IU] | Freq: Three times a day (TID) | SUBCUTANEOUS | Status: DC
  Administered 2017-09-13: 5 [IU] via SUBCUTANEOUS
  Administered 2017-09-14 (×3): 3 [IU] via SUBCUTANEOUS
  Administered 2017-09-15: 2 [IU] via SUBCUTANEOUS

## 2017-09-13 MED ORDER — SODIUM CHLORIDE 0.9 % IV SOLN
1.0000 g | INTRAVENOUS | Status: DC
  Administered 2017-09-14 (×2): 1 g via INTRAVENOUS
  Filled 2017-09-13 (×2): qty 1
  Filled 2017-09-13: qty 10

## 2017-09-13 MED ORDER — HYDROCODONE-ACETAMINOPHEN 5-325 MG PO TABS
1.0000 | ORAL_TABLET | Freq: Four times a day (QID) | ORAL | Status: DC | PRN
  Administered 2017-09-13 – 2017-09-14 (×3): 2 via ORAL
  Filled 2017-09-13 (×3): qty 2

## 2017-09-13 MED ORDER — SODIUM CHLORIDE 0.9 % IV SOLN
500.0000 mg | INTRAVENOUS | Status: DC
  Administered 2017-09-13 – 2017-09-14 (×2): 500 mg via INTRAVENOUS
  Filled 2017-09-13 (×3): qty 500

## 2017-09-13 MED ORDER — ALBUTEROL SULFATE (2.5 MG/3ML) 0.083% IN NEBU
2.5000 mg | INHALATION_SOLUTION | RESPIRATORY_TRACT | Status: DC | PRN
  Administered 2017-09-13: 2.5 mg via RESPIRATORY_TRACT
  Filled 2017-09-13: qty 3

## 2017-09-13 MED ORDER — ACETAMINOPHEN 325 MG PO TABS: 650.0000 mg | ORAL_TABLET | Freq: Four times a day (QID) | ORAL | Status: DC | PRN

## 2017-09-13 MED ORDER — ALBUTEROL SULFATE (2.5 MG/3ML) 0.083% IN NEBU
2.5000 mg | INHALATION_SOLUTION | Freq: Four times a day (QID) | RESPIRATORY_TRACT | Status: DC
  Filled 2017-09-13: qty 3

## 2017-09-13 MED ORDER — AMLODIPINE BESYLATE 5 MG PO TABS
5.0000 mg | ORAL_TABLET | Freq: Every day | ORAL | Status: DC
  Administered 2017-09-13 – 2017-09-15 (×3): 5 mg via ORAL
  Filled 2017-09-13 (×3): qty 1

## 2017-09-13 MED ORDER — INSULIN ASPART 100 UNIT/ML ~~LOC~~ SOLN
6.0000 [IU] | Freq: Three times a day (TID) | SUBCUTANEOUS | Status: DC
  Administered 2017-09-13 – 2017-09-15 (×6): 6 [IU] via SUBCUTANEOUS

## 2017-09-13 MED ORDER — IBUPROFEN 600 MG PO TABS
600.0000 mg | ORAL_TABLET | Freq: Four times a day (QID) | ORAL | Status: DC | PRN
  Administered 2017-09-13 – 2017-09-14 (×4): 600 mg via ORAL
  Filled 2017-09-13 (×4): qty 1

## 2017-09-13 MED ORDER — GABAPENTIN 100 MG PO CAPS
200.0000 mg | ORAL_CAPSULE | Freq: Three times a day (TID) | ORAL | Status: DC
  Administered 2017-09-13 – 2017-09-15 (×7): 200 mg via ORAL
  Filled 2017-09-13 (×7): qty 2

## 2017-09-13 MED ORDER — TRAZODONE HCL 50 MG PO TABS: 200.0000 mg | ORAL_TABLET | Freq: Every day | ORAL | Status: DC

## 2017-09-13 MED ORDER — ENOXAPARIN SODIUM 80 MG/0.8ML ~~LOC~~ SOLN
0.5000 mg/kg | SUBCUTANEOUS | Status: DC
  Administered 2017-09-13: 03:00:00 via SUBCUTANEOUS
  Administered 2017-09-14 – 2017-09-15 (×2): 70 mg via SUBCUTANEOUS
  Filled 2017-09-13 (×3): qty 0.8

## 2017-09-13 MED ORDER — BENZONATATE 100 MG PO CAPS
200.0000 mg | ORAL_CAPSULE | Freq: Three times a day (TID) | ORAL | Status: DC | PRN
  Administered 2017-09-14: 200 mg via ORAL
  Filled 2017-09-13 (×2): qty 2

## 2017-09-13 MED ORDER — GUAIFENESIN-DM 100-10 MG/5ML PO SYRP
5.0000 mL | ORAL_SOLUTION | ORAL | Status: DC | PRN
  Administered 2017-09-13 – 2017-09-14 (×4): 5 mL via ORAL
  Filled 2017-09-13 (×4): qty 5

## 2017-09-13 MED ORDER — QUETIAPINE FUMARATE 100 MG PO TABS
800.0000 mg | ORAL_TABLET | Freq: Every day | ORAL | Status: DC
  Administered 2017-09-13 – 2017-09-14 (×3): 800 mg via ORAL
  Filled 2017-09-13 (×3): qty 8

## 2017-09-13 MED ORDER — MAGNESIUM SULFATE 2 GM/50ML IV SOLN
2.0000 g | Freq: Once | INTRAVENOUS | Status: AC
  Administered 2017-09-13: 2 g via INTRAVENOUS
  Filled 2017-09-13: qty 50

## 2017-09-13 MED ORDER — GUAIFENESIN ER 600 MG PO TB12
1200.0000 mg | ORAL_TABLET | Freq: Two times a day (BID) | ORAL | Status: DC
  Administered 2017-09-13 – 2017-09-15 (×5): 1200 mg via ORAL
  Filled 2017-09-13 (×5): qty 2

## 2017-09-13 MED ORDER — GUAIFENESIN ER 600 MG PO TB12
600.0000 mg | ORAL_TABLET | Freq: Two times a day (BID) | ORAL | Status: DC
  Administered 2017-09-13: 600 mg via ORAL
  Filled 2017-09-13: qty 1

## 2017-09-13 MED ORDER — IPRATROPIUM-ALBUTEROL 0.5-2.5 (3) MG/3ML IN SOLN
3.0000 mL | Freq: Four times a day (QID) | RESPIRATORY_TRACT | Status: DC
  Administered 2017-09-13 – 2017-09-15 (×7): 3 mL via RESPIRATORY_TRACT
  Filled 2017-09-13 (×7): qty 3

## 2017-09-13 MED ORDER — POTASSIUM CHLORIDE IN NACL 20-0.9 MEQ/L-% IV SOLN
INTRAVENOUS | Status: DC
  Administered 2017-09-13: 1000 mL via INTRAVENOUS
  Filled 2017-09-13: qty 1000

## 2017-09-13 MED ORDER — ONDANSETRON HCL 4 MG PO TABS: 4.0000 mg | ORAL_TABLET | Freq: Four times a day (QID) | ORAL | Status: DC | PRN

## 2017-09-13 MED ORDER — CLONIDINE HCL 0.1 MG PO TABS
0.3000 mg | ORAL_TABLET | Freq: Two times a day (BID) | ORAL | Status: DC
  Administered 2017-09-13 – 2017-09-15 (×5): 0.3 mg via ORAL
  Filled 2017-09-13 (×5): qty 1

## 2017-09-13 MED ORDER — KETOROLAC TROMETHAMINE 30 MG/ML IJ SOLN
30.0000 mg | Freq: Once | INTRAMUSCULAR | Status: AC
  Administered 2017-09-13: 30 mg via INTRAVENOUS
  Filled 2017-09-13: qty 1

## 2017-09-13 MED ORDER — PANTOPRAZOLE SODIUM 40 MG PO TBEC
40.0000 mg | DELAYED_RELEASE_TABLET | Freq: Every day | ORAL | Status: DC
  Administered 2017-09-13 – 2017-09-15 (×3): 40 mg via ORAL
  Filled 2017-09-13 (×3): qty 1

## 2017-09-13 MED ORDER — ACETAMINOPHEN 650 MG RE SUPP: 650.0000 mg | Freq: Four times a day (QID) | RECTAL | Status: DC | PRN

## 2017-09-13 MED ORDER — INSULIN ASPART 100 UNIT/ML ~~LOC~~ SOLN
0.0000 [IU] | Freq: Three times a day (TID) | SUBCUTANEOUS | Status: DC
  Administered 2017-09-13 (×2): 5 [IU] via SUBCUTANEOUS

## 2017-09-13 MED ORDER — TRAZODONE HCL 50 MG PO TABS
200.0000 mg | ORAL_TABLET | Freq: Every day | ORAL | Status: DC
  Administered 2017-09-13: 200 mg via ORAL
  Filled 2017-09-13: qty 4

## 2017-09-13 MED ORDER — QUETIAPINE FUMARATE 100 MG PO TABS: 800.0000 mg | ORAL_TABLET | Freq: Every day | ORAL | Status: DC

## 2017-09-13 MED ORDER — IOPAMIDOL (ISOVUE-370) INJECTION 76%
100.0000 mL | Freq: Once | INTRAVENOUS | Status: AC | PRN
  Administered 2017-09-13: 100 mL via INTRAVENOUS

## 2017-09-13 MED ORDER — POTASSIUM CHLORIDE CRYS ER 20 MEQ PO TBCR
20.0000 meq | EXTENDED_RELEASE_TABLET | Freq: Once | ORAL | Status: AC
  Administered 2017-09-13: 20 meq via ORAL
  Filled 2017-09-13: qty 1

## 2017-09-13 MED ORDER — SODIUM CHLORIDE 0.9 % IV SOLN
INTRAVENOUS | Status: DC
  Administered 2017-09-13 – 2017-09-14 (×2): via INTRAVENOUS

## 2017-09-13 MED ORDER — DOCUSATE SODIUM 100 MG PO CAPS: 100.0000 mg | ORAL_CAPSULE | Freq: Two times a day (BID) | ORAL | Status: DC | PRN

## 2017-09-13 MED ORDER — IPRATROPIUM BROMIDE 0.02 % IN SOLN
0.5000 mg | Freq: Four times a day (QID) | RESPIRATORY_TRACT | Status: DC
  Filled 2017-09-13: qty 2.5

## 2017-09-13 MED ORDER — CYCLOBENZAPRINE HCL 10 MG PO TABS
5.0000 mg | ORAL_TABLET | Freq: Three times a day (TID) | ORAL | Status: DC | PRN
  Administered 2017-09-13 – 2017-09-14 (×4): 5 mg via ORAL
  Filled 2017-09-13 (×4): qty 1

## 2017-09-13 MED ORDER — ONDANSETRON HCL 4 MG/2ML IJ SOLN: 4.0000 mg | Freq: Four times a day (QID) | INTRAMUSCULAR | Status: DC | PRN

## 2017-09-13 MED ORDER — INSULIN ASPART 100 UNIT/ML ~~LOC~~ SOLN: 0.0000 [IU] | Freq: Every day | SUBCUTANEOUS | Status: DC

## 2017-09-13 MED ORDER — METOPROLOL TARTRATE 50 MG PO TABS
100.0000 mg | ORAL_TABLET | Freq: Two times a day (BID) | ORAL | Status: DC
  Administered 2017-09-13 – 2017-09-15 (×5): 100 mg via ORAL
  Filled 2017-09-13 (×5): qty 2

## 2017-09-13 MED ORDER — ATORVASTATIN CALCIUM 20 MG PO TABS
20.0000 mg | ORAL_TABLET | Freq: Every day | ORAL | Status: DC
  Administered 2017-09-13 – 2017-09-15 (×3): 20 mg via ORAL
  Filled 2017-09-13 (×3): qty 1

## 2017-09-13 NOTE — Progress Notes (Signed)
09/13/2017 5:53 PM  Pt continuing to complain of sharp stabbing chest pain.  Sudden worsening of pain. Will obtain CTA chest.   EKG reviewed, no acute findings, telemetry stable.    Murvin Natal MD
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
## 2017-09-14 ENCOUNTER — Inpatient Hospital Stay (HOSPITAL_COMMUNITY): Payer: Self-pay

## 2017-09-14 ENCOUNTER — Other Ambulatory Visit: Payer: Self-pay

## 2017-09-14 DIAGNOSIS — J181 Lobar pneumonia, unspecified organism: Principal | ICD-10-CM

## 2017-09-14 DIAGNOSIS — Z9049 Acquired absence of other specified parts of digestive tract: Secondary | ICD-10-CM

## 2017-09-14 LAB — CBC WITH DIFFERENTIAL/PLATELET
BASOS ABS: 0 10*3/uL (ref 0.0–0.1)
BASOS PCT: 0 %
Eosinophils Absolute: 0.1 10*3/uL (ref 0.0–0.7)
Eosinophils Relative: 1 %
HEMATOCRIT: 34.4 % — AB (ref 36.0–46.0)
HEMOGLOBIN: 10.6 g/dL — AB (ref 12.0–15.0)
Lymphocytes Relative: 18 %
Lymphs Abs: 1.2 10*3/uL (ref 0.7–4.0)
MCH: 25.4 pg — ABNORMAL LOW (ref 26.0–34.0)
MCHC: 30.8 g/dL (ref 30.0–36.0)
MCV: 82.3 fL (ref 78.0–100.0)
MONOS PCT: 8 %
Monocytes Absolute: 0.5 10*3/uL (ref 0.1–1.0)
NEUTROS ABS: 5 10*3/uL (ref 1.7–7.7)
NEUTROS PCT: 73 %
Platelets: 302 10*3/uL (ref 150–400)
RBC: 4.18 MIL/uL (ref 3.87–5.11)
RDW: 15.1 % (ref 11.5–15.5)
WBC: 6.8 10*3/uL (ref 4.0–10.5)

## 2017-09-14 LAB — BASIC METABOLIC PANEL
ANION GAP: 10 (ref 5–15)
BUN: 13 mg/dL (ref 6–20)
CHLORIDE: 107 mmol/L (ref 101–111)
CO2: 20 mmol/L — AB (ref 22–32)
Calcium: 8.6 mg/dL — ABNORMAL LOW (ref 8.9–10.3)
Creatinine, Ser: 0.9 mg/dL (ref 0.44–1.00)
GFR calc non Af Amer: >60 mL/min (ref >60)
Glucose, Bld: 232 mg/dL — ABNORMAL HIGH (ref 65–99)
POTASSIUM: 3.4 mmol/L — AB (ref 3.5–5.1)
Sodium: 137 mmol/L (ref 135–145)

## 2017-09-14 LAB — GLUCOSE, CAPILLARY
GLUCOSE-CAPILLARY: 199 mg/dL — AB (ref 65–99)
GLUCOSE-CAPILLARY: 184 mg/dL — AB (ref 65–99)
GLUCOSE-CAPILLARY: 169 mg/dL — AB (ref 65–99)
Glucose-Capillary: 170 mg/dL — ABNORMAL HIGH (ref 65–99)

## 2017-09-14 LAB — HIV ANTIBODY (ROUTINE TESTING W REFLEX): HIV Screen 4th Generation wRfx: NONREACTIVE

## 2017-09-14 LAB — TROPONIN I

## 2017-09-14 MED ORDER — IBUPROFEN 800 MG PO TABS
800.0000 mg | ORAL_TABLET | Freq: Three times a day (TID) | ORAL | Status: DC | PRN
  Administered 2017-09-14: 800 mg via ORAL
  Filled 2017-09-14: qty 1

## 2017-09-14 MED ORDER — GI COCKTAIL ~~LOC~~
30.0000 mL | Freq: Once | ORAL | Status: AC
  Administered 2017-09-14: 30 mL via ORAL
  Filled 2017-09-14: qty 30

## 2017-09-14 MED ORDER — DIPHENHYDRAMINE HCL 50 MG/ML IJ SOLN
INTRAMUSCULAR | Status: AC
  Filled 2017-09-14: qty 1

## 2017-09-14 MED ORDER — HYDROCODONE-ACETAMINOPHEN 5-325 MG PO TABS
2.0000 | ORAL_TABLET | ORAL | Status: DC | PRN
  Administered 2017-09-14 – 2017-09-15 (×2): 2 via ORAL
  Filled 2017-09-14 (×2): qty 2

## 2017-09-14 MED ORDER — DIPHENHYDRAMINE HCL 50 MG/ML IJ SOLN
25.0000 mg | Freq: Four times a day (QID) | INTRAMUSCULAR | Status: DC | PRN
  Administered 2017-09-14 (×3): 25 mg via INTRAVENOUS
  Filled 2017-09-14 (×2): qty 1

## 2017-09-14 MED ORDER — POTASSIUM CHLORIDE CRYS ER 20 MEQ PO TBCR
40.0000 meq | EXTENDED_RELEASE_TABLET | Freq: Once | ORAL | Status: AC
  Administered 2017-09-14: 40 meq via ORAL
  Filled 2017-09-14: qty 2

## 2017-09-14 NOTE — Progress Notes (Addendum)
Patient states she has sharp pain and feels like cool air moving through chest. MD notifed. STAT chest x-ray and STAT EKG ordered per Dr. Wynetta Emery.

## 2017-09-14 NOTE — Progress Notes (Addendum)
PROGRESS NOTE   Patricia [name]  KYH:[id number]  DOB: [birth year]  DOA: 09/12/2017 PCP: Soyla Dryer, PA-C   Brief Admission Hx: Patricia [name] is a 47 y.o. female with past medical history of bipolar, anxiety, depression, diabetes, obesity, hypertension presented to the emergency department complaining of anterior chest pain and intermittent symptoms of cough and congestion and flulike symptoms with low-grade fever of 100.9.  MDM/Assessment & Plan:    1. Community-acquired pneumonia-the patient has been admitted to a MedSurg bed and started on IV ceftriaxone and azithromycin and supportive therapy has been ordered. 2. Bipolar disorder/schizophrenia-resume home medications. 3. OSA-offer nightly CPAP. 4. Diabetes mellitus type 2-will provide sliding scale coverage for elevated blood glucose readings. 5. ARF- resolved now.  Likely prerenal, treated with IV fluids. 6. Atypical chest pain- troponin negative for acute ischemia. 7. History of recreational drug abuse- urine drug screen positive for marijuana. 8. Essential hypertension-resumed home blood pressure medications. 9. Sinus tachycardia-likely secondary to pneumonia and rebound beta-blocker withdrawal, getting her back on her home beta-blocker therapy.  DVT Prophylaxis: Lovenox Code Status: Full Family Communication:   Disposition Plan: Home  Subjective: Patient reports that she has ongoing cough and is wall pain, complaining about diet, otherwise no shortness of breath.  Objective: Vitals:   09/13/17 2138 09/14/17 0115 09/14/17 0516 09/14/17 1558  BP: (!) 154/64  (!) 131/94   Pulse: 87  78   Resp: 19  18   Temp: 98.5 F (36.9 C)  99 F (37.2 C)   TempSrc: Oral  Oral   SpO2: 97% 97% 96% 97%  Weight:      Height:        Intake/Output Summary (Last 24 hours) at 09/14/2017 1603 Last data filed at 09/13/2017 1700 Gross per 24 hour  Intake 480 ml  Output 2 ml  Net 478 ml   Filed Weights   09/12/17 1647  09/13/17 0135  Weight: 135.2 kg (298 lb) 136 kg (299 lb 13.2 oz)     REVIEW OF SYSTEMS  As per history otherwise all reviewed and reported negative  Exam:  General exam: Awake, alert, no distress, cooperative. Respiratory system: Rales heard in the right upper lobe. No increased work of breathing. Cardiovascular system: S1 & S2 heard, RRR. No JVD, murmurs, gallops, clicks or pedal edema. Gastrointestinal system: Abdomen is nondistended, soft and nontender. Normal bowel sounds heard. Central nervous system: Alert and oriented. No focal neurological deficits. Extremities: no CCE.  Data Reviewed: Basic Metabolic Panel: Recent Labs  Lab 09/12/17 1942 09/13/17 0621 09/14/17 0600  NA 137 138 137  K 3.4* 4.2 3.4*  CL 104 107 107  CO2 22 18* 20*  GLUCOSE 169* 312* 232*  BUN 10 11 13   CREATININE 1.08* 0.90 0.90  CALCIUM 9.2 8.8* 8.6*  MG 2.0  --   --   PHOS 3.0  --   --    Liver Function Tests: Recent Labs  Lab 09/13/17 0621  AST 28  ALT 27  ALKPHOS 60  BILITOT 0.3  PROT 7.6  ALBUMIN 3.6   No results for input(s): LIPASE, AMYLASE in the last 168 hours. No results for input(s): AMMONIA in the last 168 hours. CBC: Recent Labs  Lab 09/12/17 1942 09/13/17 0621 09/14/17 0600  WBC 12.3* 10.2 6.8  NEUTROABS 9.0* 8.9* 5.0  HGB 11.7* 10.9* 10.6*  HCT 37.0 35.8* 34.4*  MCV 81.3 82.1 82.3  PLT 297 298 302   Cardiac Enzymes: Recent Labs  Lab 09/12/17 1942  TROPONINI <0.03  CBG (last 3)  Recent Labs    09/13/17 2115 09/14/17 0750 09/14/17 1146  GLUCAP 194* 170* 199*   No results found for this or any previous visit (from the past 240 hour(s)).   Studies: Dg Chest 2 View  Result Date: 09/12/2017 CLINICAL DATA:  Body aches and fever. EXAM: CHEST  2 VIEW COMPARISON:  06/17/2017 FINDINGS: Right lung clear. Patchy opacity left lower lobe compatible with pneumonia. The cardiopericardial silhouette is within normal limits for size. The visualized bony structures  of the thorax are intact. Telemetry leads overlie the chest. IMPRESSION: Patchy airspace opacity left lower lobe suggests pneumonia Electronically Signed   By: Misty Stanley M.D.   On: 09/12/2017 20:58   Ct Angio Chest Pe W Or Wo Contrast  Result Date: 09/13/2017 CLINICAL DATA:  48 y/o F; sharp stabbing chest pain. PE suspected, intermediate prob, neg D-dimer. EXAM: CT ANGIOGRAPHY CHEST WITH CONTRAST TECHNIQUE: Multidetector CT imaging of the chest was performed using the standard protocol during bolus administration of intravenous contrast. Multiplanar CT image reconstructions and MIPs were obtained to evaluate the vascular anatomy. CONTRAST:  129mL ISOVUE-370 IOPAMIDOL (ISOVUE-370) INJECTION 76% COMPARISON:  06/05/2015 CT chest. FINDINGS: Cardiovascular: Preferential opacification of the thoracic aorta. No evidence of thoracic aortic aneurysm or dissection. Normal heart size. No pericardial effusion. Suboptimal contrast opacification of the pulmonary arteries, no central pulmonary embolus, lobar and downstream pulmonary arteries not assessed. Mediastinum/Nodes: Mild prominence of right hilar lymph nodes, likely reactive. Normal thyroid gland. Normal thoracic esophagus. Normal central airways. Lungs/Pleura: Patchy consolidation and ground-glass opacities predominantly in right lower greater than right upper lobes. Diffuse peribronchial thickening. No pleural effusion or pneumothorax. Upper Abdomen: Hepatic steatosis. Musculoskeletal: No chest wall abnormality. No acute or significant osseous findings. Review of the MIP images confirms the above findings. IMPRESSION: 1. Patchy consolidation and ground-glass opacities predominantly in right lower greater than upper lobes probably representing pneumonia. 2. Suboptimal contrast opacification of pulmonary arteries, no central embolus identified. 3. Preferential opacification of thoracic aorta, no acute abnormality of thoracic aorta. 4. Hepatic steatosis.  Electronically Signed   By: Kristine Garbe M.D.   On: 09/13/2017 19:15   Scheduled Meds: . amLODipine  5 mg Oral Daily  . atorvastatin  20 mg Oral Daily  . cloNIDine  0.3 mg Oral BID  . enoxaparin (LOVENOX) injection  0.5 mg/kg Subcutaneous Q24H  . gabapentin  200 mg Oral TID  . guaiFENesin  1,200 mg Oral BID  . insulin aspart  0-15 Units Subcutaneous TID WC  . insulin aspart  0-5 Units Subcutaneous QHS  . insulin aspart  6 Units Subcutaneous TID WC  . ipratropium-albuterol  3 mL Nebulization Q6H  . metoprolol tartrate  100 mg Oral BID  . pantoprazole  40 mg Oral Daily  . QUEtiapine  800 mg Oral QHS  . traZODone  50 mg Oral QPC supper   Continuous Infusions: . sodium chloride 50 mL/hr at 09/14/17 1222  . azithromycin Stopped (09/13/17 2339)  . cefTRIAXone (ROCEPHIN)  IV Stopped (09/14/17 0145)    Active Problems:   Diabetes mellitus type 2 in obese ([place])   BIPOLAR DISORDER UNSPECIFIED   Anxiety state   Obstructive sleep apnea   BRONCHITIS, CHRONIC   Asthma   Morbid obesity ([place])   Status post small bowel resection 07/19/2014   Acute renal failure (ARF) ([place])   Schizophrenia ([place])   Community acquired pneumonia of left lower lobe of lung ([place])  Time spent:   Irwin Brakeman, MD, FAAFP [facility]  Pager [phone]  If 7PM-7AM, please contact night-coverage [email or web address] Password [email or web address] 09/14/2017, 4:03 PM    LOS: 1 day ]

## 2017-09-15 DIAGNOSIS — J42 Unspecified chronic bronchitis: Secondary | ICD-10-CM

## 2017-09-15 LAB — BASIC METABOLIC PANEL
ANION GAP: 9 (ref 5–15)
BUN: 13 mg/dL (ref 6–20)
CALCIUM: 8.7 mg/dL — AB (ref 8.9–10.3)
CO2: 21 mmol/L — ABNORMAL LOW (ref 22–32)
Chloride: 109 mmol/L (ref 101–111)
Creatinine, Ser: 0.79 mg/dL (ref 0.44–1.00)
Glucose, Bld: 175 mg/dL — ABNORMAL HIGH (ref 65–99)
POTASSIUM: 4.2 mmol/L (ref 3.5–5.1)
Sodium: 139 mmol/L (ref 135–145)

## 2017-09-15 LAB — GLUCOSE, CAPILLARY
GLUCOSE-CAPILLARY: 126 mg/dL — AB (ref 65–99)
GLUCOSE-CAPILLARY: 188 mg/dL — AB (ref 65–99)

## 2017-09-15 LAB — CBC WITH DIFFERENTIAL/PLATELET
BASOS ABS: 0 10*3/uL (ref 0.0–0.1)
BASOS PCT: 0 %
Eosinophils Absolute: 0.1 10*3/uL (ref 0.0–0.7)
Eosinophils Relative: 2 %
HEMATOCRIT: 35.3 % — AB (ref 36.0–46.0)
Hemoglobin: 10.9 g/dL — ABNORMAL LOW (ref 12.0–15.0)
LYMPHS PCT: 39 %
Lymphs Abs: 1.8 10*3/uL (ref 0.7–4.0)
MCH: 25.3 pg — ABNORMAL LOW (ref 26.0–34.0)
MCHC: 30.9 g/dL (ref 30.0–36.0)
MCV: 81.9 fL (ref 78.0–100.0)
MONO ABS: 0.4 10*3/uL (ref 0.1–1.0)
Monocytes Relative: 9 %
NEUTROS ABS: 2.3 10*3/uL (ref 1.7–7.7)
Neutrophils Relative %: 50 %
PLATELETS: 275 10*3/uL (ref 150–400)
RBC: 4.31 MIL/uL (ref 3.87–5.11)
RDW: 14.9 % (ref 11.5–15.5)
WBC: 4.6 10*3/uL (ref 4.0–10.5)

## 2017-09-15 LAB — TROPONIN I: Troponin I: <0.03 ng/mL (ref <0.03)

## 2017-09-15 MED ORDER — DOXYCYCLINE HYCLATE 100 MG PO CAPS: 100.0000 mg | ORAL_CAPSULE | Freq: Two times a day (BID) | ORAL | 0 refills | Status: AC

## 2017-09-15 MED ORDER — GABAPENTIN 400 MG PO CAPS: 400.0000 mg | ORAL_CAPSULE | Freq: Three times a day (TID) | ORAL | Status: DC

## 2017-09-15 MED ORDER — BISACODYL 10 MG RE SUPP
10.0000 mg | Freq: Once | RECTAL | Status: AC
  Administered 2017-09-15: 10 mg via RECTAL
  Filled 2017-09-15: qty 1

## 2017-09-15 MED ORDER — POLYETHYLENE GLYCOL 3350 17 G PO PACK: 17.0000 g | PACK | Freq: Two times a day (BID) | ORAL | 1 refills | Status: AC

## 2017-09-15 MED ORDER — GUAIFENESIN-DM 100-10 MG/5ML PO SYRP: 10.0000 mL | ORAL_SOLUTION | ORAL | 0 refills | Status: DC | PRN

## 2017-09-15 MED ORDER — POLYETHYLENE GLYCOL 3350 17 G PO PACK
17.0000 g | PACK | Freq: Two times a day (BID) | ORAL | Status: DC
  Administered 2017-09-15: 17 g via ORAL
  Filled 2017-09-15: qty 1

## 2017-09-15 NOTE — Discharge Summary (Signed)
Physician Discharge Summary  Patricia [name]:[id number] DOB: March 01, 1971 DOA: 09/12/2017  PCP: Soyla Dryer, PA-C  Admit date: 09/12/2017 Discharge date: 09/15/2017  Admitted From: Home Disposition: Home   Recommendations for Outpatient Follow-up:  1. Follow up with PCP in 1 weeks 2. Please obtain BMP/CBC in one week 3. Please follow up on the following pending results: Final Culture Data 1. Please repeat chest xray in 4-6 weeks to ensure resolution on pneumonia  Discharge Condition: STABLE   CODE STATUS: FULL    Brief Hospitalization Summary: Please see all hospital notes, images, labs for full details of the hospitalization. HPI: Patricia [name] is a 47 y.o. female with past medical history of bipolar, anxiety, depression, diabetes, obesity, hypertension presented to the emergency department complaining of anterior chest pain and intermittent symptoms of cough and congestion and flu-like symptoms with low-grade fever of 100.9.  The patient has no neck or jaw pain.  No arm or shoulder pain.  No vomiting or loss of consciousness.  The patient denies abdominal pain, diarrhea and dysuria.  She has had cough and chest congestion.  She has had some vomiting.  She reports cough and sneezing and wheezing in the chest as well.  She was seen in the emergency department and noted to have a mild leukocytosis.  She was given a dose of Decadron and a breathing treatment.  She was hypokalemic with a potassium of 3.4.  Her white blood cell count was 12.3.  Her hemoglobin was 11.7.  Her influenza testing was negative.  Her chest x-ray suggested a left lower lobe pneumonia.  She is being admitted for further treatment.  Brief Admission Hx: Patricia [name] a 47 y.o.femalewith past medical history of bipolar, anxiety, depression, diabetes, obesity, hypertensionpresented to the emergency department complaining of anterior chest pain and intermittent symptoms of cough and congestion and flulike  symptoms with low-grade fever of 100.9.  MDM/Assessment & Plan:   1. Community-acquired pneumonia-the patient was admitted to a MedSurg bed and started on IV ceftriaxone and azithromycin and supportive therapy has been ordered.  She has clinically gotten much better.  Her vitals are stable.  She is being discharged on doxycycline x 7 more days.  Please repeat chest xray in 4-6 weeks to ensure resolution on pneumonia.  2. Bipolar disorder/schizophrenia-resume home medications. 3. OSA-offer nightly CPAP. 4. Diabetes mellitus type 2-provided sliding scale coverage for elevated blood glucose readings. 5. ARF- resolved now.  Likely prerenal, treated with IV fluids. 6. Atypical chest pain- troponin negative for acute ischemia. 7. History of recreational drug abuse- urine drug screen positive for marijuana. 8. Essential hypertension-resumed home blood pressure medications. 9. Sinus tachycardia-RESOLVED NOW.  Likely secondary to pneumonia and rebound beta-blocker withdrawal, getting her back on her home beta-blocker therapy. 10. Chronic constipation - laxatives ordered and will discharge home on miralax BID.   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   Acute renal failure (ARF) ([place])   Schizophrenia ([place])   Community acquired pneumonia of left lower lobe of lung [facility])  Discharge Instructions: Discharge Instructions    Call MD for:  difficulty breathing, headache or visual disturbances   Complete by:  As directed    Call MD for:  extreme fatigue   Complete by:  As directed    Call MD for:  persistant dizziness or light-headedness   Complete by:  As directed    Call MD for:   persistant nausea and vomiting   Complete by:  As directed    Increase activity slowly   Complete by:  As directed      Allergies as of 09/15/2017      Reactions   Peanut-containing Drug Products Anaphylaxis   Peanut Oil Only.  Can eat peanuts with no problems      Medication List    STOP taking these medications   cyclobenzaprine 10 MG tablet Commonly known as:  FLEXERIL     TAKE these medications   albuterol 108 (90 Base) MCG/ACT inhaler Commonly known as:  PROVENTIL HFA;VENTOLIN HFA Inhale 2 puffs into the lungs every 6 (six) hours as needed for wheezing or shortness of breath.   amLODipine 5 MG tablet Commonly known as:  NORVASC Take 1 tablet (5 mg total) by mouth daily.   atorvastatin 20 MG tablet Commonly known as:  LIPITOR Take 1 tablet (20 mg total) by mouth daily.   cloNIDine 0.3 MG tablet Commonly known as:  CATAPRES Take 1 tablet (0.3 mg total) by mouth 2 (two) times daily. For high blood pressure   diclofenac 75 MG EC tablet Commonly known as:  VOLTAREN Take 1 tablet (75 mg total) by mouth 2 (two) times daily as needed.   docusate sodium 100 MG capsule Commonly known as:  COLACE Take 1 capsule (100 mg total) by mouth 2 (two) times daily as needed for mild constipation.   doxycycline 100 MG capsule Commonly known as:  VIBRAMYCIN Take 1 capsule (100 mg total) by mouth 2 (two) times daily for 7 days.   gabapentin 400 MG capsule Commonly known as:  NEURONTIN Take 1 capsule (400 mg total) by mouth 3 (three) times daily. For agitation What changed:  when to take this   guaiFENesin-dextromethorphan 100-10 MG/5ML syrup Commonly known as:  ROBITUSSIN DM Take 10 mLs by mouth every 4 (four) hours as needed for cough.   hydrOXYzine 25 MG tablet Commonly known as:  ATARAX/VISTARIL Take 1 tablet (25 mg total) by mouth 3 (three) times daily as needed for anxiety.   lidocaine 5 % Commonly known as:  LIDODERM Place 2 patches onto the skin daily. Remove &  Discard patch within 12 hours or as directed by MD: For pain management   metFORMIN 500 MG tablet Commonly known as:  GLUCOPHAGE Take 1 tablet (500 mg total) by mouth 2 (two) times daily with a meal.   methocarbamol 500 MG tablet Commonly known as:  ROBAXIN Take 1 tablet (500 mg total) by mouth 2 (two) times daily.   metoprolol tartrate 100 MG tablet Commonly known as:  LOPRESSOR Take 1 tablet (100 mg total) by mouth 2 (two) times daily. (this replaces atenolol)   montelukast 10 MG tablet Commonly known as:  SINGULAIR Take 1 tablet (10 mg total) by mouth daily. For shortness of breath   omeprazole 40 MG capsule Commonly known as:  PRILOSEC TAKE 1 Capsule BY MOUTH EVERY EVENING   phenylephrine-shark liver oil-mineral oil-petrolatum 0.25-3-14-71.9 % rectal ointment Commonly known as:  PREPARATION H Place rectally 2 (two) times daily as needed for hemorrhoids.   polyethylene glycol packet Commonly known as:  MIRALAX / GLYCOLAX Take 17 g by mouth 2 (two) times daily for 15 days.   QUEtiapine 400 MG tablet Commonly known as:  SEROQUEL Take 2 tablets (800 mg total) by mouth at bedtime. For mood control   traZODone 50 MG tablet Commonly known as:  DESYREL Take 1 tablet (50 mg total) by mouth at bedtime as needed for sleep. What changed:    how much to take  when to take this      Follow-up Information    Soyla Dryer, PA-C. Schedule an appointment as soon as possible for a visit in 1 week(s).   Specialty:  Physician Assistant Why:  Hospital follow-up Contact information: [place] [address] [phone]          Allergies  Allergen Reactions  . Peanut-Containing Drug Products Anaphylaxis    Peanut Oil Only.  Can eat peanuts with no problems   Allergies as of 09/15/2017      Reactions   Peanut-containing Drug Products Anaphylaxis   Peanut Oil Only.  Can eat peanuts with no problems      Medication List    STOP taking these  medications   cyclobenzaprine 10 MG tablet Commonly known as:  FLEXERIL     TAKE these medications   albuterol 108 (90 Base) MCG/ACT inhaler Commonly known as:  PROVENTIL HFA;VENTOLIN HFA Inhale 2 puffs into the lungs every 6 (six) hours as needed for wheezing or shortness of breath.   amLODipine 5 MG tablet Commonly known as:  NORVASC Take 1 tablet (5 mg total) by mouth daily.   atorvastatin 20 MG tablet Commonly known as:  LIPITOR Take 1 tablet (20 mg total) by mouth daily.   cloNIDine 0.3 MG tablet Commonly known as:  CATAPRES Take 1 tablet (0.3 mg total) by mouth 2 (two) times daily. For high blood pressure   diclofenac 75 MG EC tablet Commonly known as:  VOLTAREN Take 1 tablet (75 mg total) by mouth 2 (two) times daily as needed.   docusate sodium 100 MG capsule Commonly known as:  COLACE Take 1 capsule (100 mg total) by mouth 2 (two) times daily as needed for mild constipation.   doxycycline 100 MG capsule Commonly known as:  VIBRAMYCIN Take 1 capsule (100 mg total) by mouth 2 (two) times daily for 7 days.   gabapentin 400 MG capsule Commonly known as:  NEURONTIN Take 1 capsule (400 mg total) by mouth 3 (three) times daily. For agitation What changed:  when to take this   guaiFENesin-dextromethorphan 100-10 MG/5ML syrup Commonly known as:  ROBITUSSIN DM Take 10 mLs by mouth every 4 (four) hours as needed for cough.   hydrOXYzine 25 MG tablet Commonly known as:  ATARAX/VISTARIL Take 1 tablet (25 mg total) by mouth 3 (three) times daily as needed for anxiety.   lidocaine 5 % Commonly known as:  LIDODERM Place 2 patches onto the skin daily. Remove & Discard patch within 12 hours or as directed by MD: For pain management   metFORMIN 500 MG tablet Commonly known as:  GLUCOPHAGE Take 1 tablet (500 mg total) by mouth 2 (two) times daily with a meal.   methocarbamol 500 MG tablet Commonly known as:  ROBAXIN Take 1 tablet (500 mg total) by mouth 2 (two) times  daily.   metoprolol tartrate 100 MG tablet Commonly known as:  LOPRESSOR Take 1 tablet (100 mg total) by mouth 2 (two) times daily. (this replaces atenolol)   montelukast 10 MG tablet Commonly known as:  SINGULAIR Take 1 tablet (10 mg total) by mouth daily. For shortness of breath   omeprazole 40 MG capsule Commonly known  as:  PRILOSEC TAKE 1 Capsule BY MOUTH EVERY EVENING   phenylephrine-shark liver oil-mineral oil-petrolatum 0.25-3-14-71.9 % rectal ointment Commonly known as:  PREPARATION H Place rectally 2 (two) times daily as needed for hemorrhoids.   polyethylene glycol packet Commonly known as:  MIRALAX / GLYCOLAX Take 17 g by mouth 2 (two) times daily for 15 days.   QUEtiapine 400 MG tablet Commonly known as:  SEROQUEL Take 2 tablets (800 mg total) by mouth at bedtime. For mood control   traZODone 50 MG tablet Commonly known as:  DESYREL Take 1 tablet (50 mg total) by mouth at bedtime as needed for sleep. What changed:    how much to take  when to take this       Procedures/Studies: Dg Chest 2 View  Result Date: 09/12/2017 CLINICAL DATA:  Body aches and fever. EXAM: CHEST  2 VIEW COMPARISON:  06/17/2017 FINDINGS: Right lung clear. Patchy opacity left lower lobe compatible with pneumonia. The cardiopericardial silhouette is within normal limits for size. The visualized bony structures of the thorax are intact. Telemetry leads overlie the chest. IMPRESSION: Patchy airspace opacity left lower lobe suggests pneumonia Electronically Signed   By: Misty Stanley M.D.   On: 09/12/2017 20:58   Ct Angio Chest Pe W Or Wo Contrast  Result Date: 09/13/2017 CLINICAL DATA:  47 y/o F; sharp stabbing chest pain. PE suspected, intermediate prob, neg D-dimer. EXAM: CT ANGIOGRAPHY CHEST WITH CONTRAST TECHNIQUE: Multidetector CT imaging of the chest was performed using the standard protocol during bolus administration of intravenous contrast. Multiplanar CT image reconstructions and  MIPs were obtained to evaluate the vascular anatomy. CONTRAST:  160mL ISOVUE-370 IOPAMIDOL (ISOVUE-370) INJECTION 76% COMPARISON:  06/05/2015 CT chest. FINDINGS: Cardiovascular: Preferential opacification of the thoracic aorta. No evidence of thoracic aortic aneurysm or dissection. Normal heart size. No pericardial effusion. Suboptimal contrast opacification of the pulmonary arteries, no central pulmonary embolus, lobar and downstream pulmonary arteries not assessed. Mediastinum/Nodes: Mild prominence of right hilar lymph nodes, likely reactive. Normal thyroid gland. Normal thoracic esophagus. Normal central airways. Lungs/Pleura: Patchy consolidation and ground-glass opacities predominantly in right lower greater than right upper lobes. Diffuse peribronchial thickening. No pleural effusion or pneumothorax. Upper Abdomen: Hepatic steatosis. Musculoskeletal: No chest wall abnormality. No acute or significant osseous findings. Review of the MIP images confirms the above findings. IMPRESSION: 1. Patchy consolidation and ground-glass opacities predominantly in right lower greater than upper lobes probably representing pneumonia. 2. Suboptimal contrast opacification of pulmonary arteries, no central embolus identified. 3. Preferential opacification of thoracic aorta, no acute abnormality of thoracic aorta. 4. Hepatic steatosis. Electronically Signed   By: Kristine Garbe M.D.   On: 09/13/2017 19:15   Mr Lumbar Spine Wo Contrast  Result Date: 08/19/2017 CLINICAL DATA:  Chronic low back pain. EXAM: MRI LUMBAR SPINE WITHOUT CONTRAST TECHNIQUE: Multiplanar, multisequence MR imaging of the lumbar spine was performed. No intravenous contrast was administered. COMPARISON:  Lumbar spine radiographs 07/27/2017 FINDINGS: Segmentation: 5 non rib-bearing lumbar type vertebral bodies are present. Alignment:  AP alignment is anatomic. Vertebrae:  Marrow signal and vertebral body heights are normal. Conus medullaris and  cauda equina: Conus extends to the L1 level. Conus and cauda equina appear normal. Paraspinal and other soft tissues: Limited imaging of the abdomen is unremarkable. There is no significant adenopathy. Disc levels: No focal disc protrusion or stenosis is present. Prominent epidural fat crowds the nerve roots at the L5-S1 level. IMPRESSION: 1. No focal disc protrusion or stenosis. 2. Epidural lipomatosis crowds the nerve  roots at the L5-S1 level. Electronically Signed   By: San Morelle M.D.   On: 08/19/2017 15:56   Dg Abd Acute W/chest  Result Date: 09/14/2017 CLINICAL DATA:  Substernal chest pain. EXAM: DG ABDOMEN ACUTE W/ 1V CHEST COMPARISON:  Chest CTA 09/13/2017 and radiographs 09/12/2017. Abdominal radiographs 03/16/2017. FINDINGS: The cardiac silhouette is accentuated by AP technique. Peribronchial thickening is noted. There is a small amount of focal hazy opacity in the right lung base which likely corresponds to the ground-glass opacity on CTA, better demonstrated on that examination. Mild patchy left basilar opacity is unchanged from the prior radiographs. No sizable pleural effusion or pneumothorax is identified. No intraperitoneal free air is identified. A moderate amount of stool is present throughout the ascending and transverse colon. Gas and a small amount of stool are present in the more distal colon. No dilated loops of bowel are seen to suggest obstruction. No acute osseous abnormality is identified. IMPRESSION: 1. Unchanged patchy basilar airspace opacities compatible with pneumonia. 2. Moderate proximal colonic stool burden. No evidence of bowel obstruction. Electronically Signed   By: Logan Bores M.D.   On: 09/14/2017 20:32      Subjective: Patient reports that she is feeling a lot better she is not having shortness of breath or any more chest pain.  She has had some difficulty with chronic constipation and is requesting some laxative therapy.  Discharge Exam: Vitals:    09/15/17 0400 09/15/17 0743  BP: (!) 120/91   Pulse: 78   Resp: 20   Temp: 97.7 F (36.5 C)   SpO2: 96% 100%   Vitals:   09/14/17 1558 09/14/17 2144 09/15/17 0400 09/15/17 0743  BP:  (!) 142/97 (!) 120/91   Pulse:  92 78   Resp:  19 20   Temp:  98.4 F (36.9 C) 97.7 F (36.5 C)   TempSrc:  Oral Axillary   SpO2: 97% 97% 96% 100%  Weight:      Height:       General exam: Awake, alert, no distress, cooperative. Respiratory system: Rales heard in the right upper lobe. No increased work of breathing. Cardiovascular system: S1 & S2 heard, RRR. No JVD, murmurs, gallops, clicks or pedal edema. Gastrointestinal system: Abdomen is nondistended, soft and nontender. Normal bowel sounds heard. Central nervous system: Alert and oriented. No focal neurological deficits. Extremities: no CCE.   The results of significant diagnostics from this hospitalization (including imaging, microbiology, ancillary and laboratory) are listed below for reference.     Microbiology: No results found for this or any previous visit (from the past 240 hour(s)).   Labs: BNP (last 3 results) No results for input(s): BNP in the last 8760 hours. Basic Metabolic Panel: Recent Labs  Lab 09/12/17 1942 09/13/17 0621 09/14/17 0600 09/15/17 0154  NA 137 138 137 139  K 3.4* 4.2 3.4* 4.2  CL 104 107 107 109  CO2 22 18* 20* 21*  GLUCOSE 169* 312* 232* 175*  BUN 10 11 13 13   CREATININE 1.08* 0.90 0.90 0.79  CALCIUM 9.2 8.8* 8.6* 8.7*  MG 2.0  --   --   --   PHOS 3.0  --   --   --    Liver Function Tests: Recent Labs  Lab 09/13/17 0621  AST 28  ALT 27  ALKPHOS 60  BILITOT 0.3  PROT 7.6  ALBUMIN 3.6   No results for input(s): LIPASE, AMYLASE in the last 168 hours. No results for input(s): AMMONIA in the last  168 hours. CBC: Recent Labs  Lab 09/12/17 1942 09/13/17 0621 09/14/17 0600 09/15/17 0154  WBC 12.3* 10.2 6.8 4.6  NEUTROABS 9.0* 8.9* 5.0 2.3  HGB 11.7* 10.9* 10.6* 10.9*  HCT 37.0 35.8*  34.4* 35.3*  MCV 81.3 82.1 82.3 81.9  PLT 297 298 302 275   Cardiac Enzymes: Recent Labs  Lab 09/12/17 1942 09/14/17 1936 09/15/17 0154  TROPONINI <0.03 <0.03 <0.03   BNP: Invalid input(s): POCBNP CBG: Recent Labs  Lab 09/14/17 0750 09/14/17 1146 09/14/17 1652 09/14/17 2130 09/15/17 0806  GLUCAP 170* 199* 184* 169* 126*   D-Dimer Recent Labs    09/12/17 1942  DDIMER 0.32   Hgb A1c No results for input(s): HGBA1C in the last 72 hours. Lipid Profile No results for input(s): CHOL, HDL, LDLCALC, TRIG, CHOLHDL, LDLDIRECT in the last 72 hours. Thyroid function studies No results for input(s): TSH, T4TOTAL, T3FREE, THYROIDAB in the last 72 hours.  Invalid input(s): FREET3 Anemia work up No results for input(s): VITAMINB12, FOLATE, FERRITIN, TIBC, IRON, RETICCTPCT in the last 72 hours. Urinalysis    Component Value Date/Time   COLORURINE YELLOW 09/12/2017 2026   APPEARANCEUR HAZY (A) 09/12/2017 2026   LABSPEC 1.017 09/12/2017 2026   PHURINE 6.0 09/12/2017 2026   GLUCOSEU NEGATIVE 09/12/2017 2026   HGBUR NEGATIVE 09/12/2017 2026   BILIRUBINUR NEGATIVE 09/12/2017 2026   KETONESUR NEGATIVE 09/12/2017 2026   PROTEINUR NEGATIVE 09/12/2017 2026   UROBILINOGEN 0.2 03/30/2015 0040   NITRITE NEGATIVE 09/12/2017 2026   LEUKOCYTESUR TRACE (A) 09/12/2017 2026   Sepsis Labs Invalid input(s): PROCALCITONIN,  WBC,  LACTICIDVEN Microbiology No results found for this or any previous visit (from the past 240 hour(s)).  Time coordinating discharge: 34 minutes  SIGNED:  Irwin Brakeman, MD  [facility] 09/15/2017, 10:33 AM Pager [phone]  If 7PM-7AM, please contact night-coverage [email or web address] Password [email or web address]

## 2017-09-15 NOTE — Care Management Note (Signed)
Case Management Note  Patient Details  Name: CICILIA CLINGER MRN: [id number] Date of Birth: Nov 04, 1970  Subjective/Objective:        Admitted with CAP. Pt is independent from home. She has no insurance, unemployed, goes to the [employer] clinic. Recently applied for the [facility] FAP.             Action/Plan: DC home today. Needs med assistance. MATCH give. GoodRx given, pt says discount cards will do no good because she has no money.   Expected Discharge Date:  09/15/17               Expected Discharge Plan:  Home/Self Care  In-House Referral:  Clinical Social Work  Discharge planning Services  [place] [facility], CM Consult  Post Acute Care Choice:  NA Choice offered to:  Parent  Status of Service:  Completed, signed off  Sherald Barge, RN 09/15/2017, 11:01 AM

## 2017-09-15 NOTE — Progress Notes (Signed)
Patricia [name] discharged Home per MD order.  Discharge instructions reviewed and discussed with the patient, all questions and concerns answered. Copy of instructions and scripts given to patient.  Allergies as of 09/15/2017      Reactions   Peanut-containing Drug Products Anaphylaxis   Peanut Oil Only.  Can eat peanuts with no problems      Medication List    STOP taking these medications   cyclobenzaprine 10 MG tablet Commonly known as:  FLEXERIL     TAKE these medications   albuterol 108 (90 Base) MCG/ACT inhaler Commonly known as:  PROVENTIL HFA;VENTOLIN HFA Inhale 2 puffs into the lungs every 6 (six) hours as needed for wheezing or shortness of breath.   amLODipine 5 MG tablet Commonly known as:  NORVASC Take 1 tablet (5 mg total) by mouth daily.   atorvastatin 20 MG tablet Commonly known as:  LIPITOR Take 1 tablet (20 mg total) by mouth daily.   cloNIDine 0.3 MG tablet Commonly known as:  CATAPRES Take 1 tablet (0.3 mg total) by mouth 2 (two) times daily. For high blood pressure   diclofenac 75 MG EC tablet Commonly known as:  VOLTAREN Take 1 tablet (75 mg total) by mouth 2 (two) times daily as needed.   docusate sodium 100 MG capsule Commonly known as:  COLACE Take 1 capsule (100 mg total) by mouth 2 (two) times daily as needed for mild constipation.   doxycycline 100 MG capsule Commonly known as:  VIBRAMYCIN Take 1 capsule (100 mg total) by mouth 2 (two) times daily for 7 days.   gabapentin 400 MG capsule Commonly known as:  NEURONTIN Take 1 capsule (400 mg total) by mouth 3 (three) times daily. For agitation What changed:  when to take this   guaiFENesin-dextromethorphan 100-10 MG/5ML syrup Commonly known as:  ROBITUSSIN DM Take 10 mLs by mouth every 4 (four) hours as needed for cough.   hydrOXYzine 25 MG tablet Commonly known as:  ATARAX/VISTARIL Take 1 tablet (25 mg total) by mouth 3 (three) times daily as needed for anxiety.   lidocaine 5  % Commonly known as:  LIDODERM Place 2 patches onto the skin daily. Remove & Discard patch within 12 hours or as directed by MD: For pain management   metFORMIN 500 MG tablet Commonly known as:  GLUCOPHAGE Take 1 tablet (500 mg total) by mouth 2 (two) times daily with a meal.   methocarbamol 500 MG tablet Commonly known as:  ROBAXIN Take 1 tablet (500 mg total) by mouth 2 (two) times daily.   metoprolol tartrate 100 MG tablet Commonly known as:  LOPRESSOR Take 1 tablet (100 mg total) by mouth 2 (two) times daily. (this replaces atenolol)   montelukast 10 MG tablet Commonly known as:  SINGULAIR Take 1 tablet (10 mg total) by mouth daily. For shortness of breath   omeprazole 40 MG capsule Commonly known as:  PRILOSEC TAKE 1 Capsule BY MOUTH EVERY EVENING   phenylephrine-shark liver oil-mineral oil-petrolatum 0.25-3-14-71.9 % rectal ointment Commonly known as:  PREPARATION H Place rectally 2 (two) times daily as needed for hemorrhoids.   polyethylene glycol packet Commonly known as:  MIRALAX / GLYCOLAX Take 17 g by mouth 2 (two) times daily for 15 days.   QUEtiapine 400 MG tablet Commonly known as:  SEROQUEL Take 2 tablets (800 mg total) by mouth at bedtime. For mood control   traZODone 50 MG tablet Commonly known as:  DESYREL Take 1 tablet (50 mg total) by mouth  at bedtime as needed for sleep. What changed:    how much to take  when to take this       Patients skin is clean, dry and intact, no evidence of skin break down. IV site discontinued and catheter remains intact. Site without signs and symptoms of complications. Dressing and pressure applied.  Patient escorted to the ER to wait for cab in a wheelchair,  no distress noted upon discharge.  Patricia [name] 09/15/2017 6:36 PM

## 2017-09-15 NOTE — Discharge Instructions (Signed)
Follow with Primary MD  Soyla Dryer, PA-C  and other consultant's as instructed your Hospitalist MD  Please get a complete blood count and chemistry panel checked by your Primary MD at your next visit, and again as instructed by your Primary MD.  Get Medicines reviewed and adjusted: Please take all your medications with you for your next visit with your Primary MD  Laboratory/radiological data: Please request your Primary MD to go over all hospital tests and procedure/radiological results at the follow up, please ask your Primary MD to get all Hospital records sent to his/her office.  In some cases, they will be blood work, cultures and biopsy results pending at the time of your discharge. Please request that your primary care M.D. follows up on these results.  Also Note the following: If you experience worsening of your admission symptoms, develop shortness of breath, life threatening emergency, suicidal or homicidal thoughts you must seek medical attention immediately by calling 911 or calling your MD immediately  if symptoms less severe.  You must read complete instructions/literature along with all the possible adverse reactions/side effects for all the Medicines you take and that have been prescribed to you. Take any new Medicines after you have completely understood and accpet all the possible adverse reactions/side effects.   Do not drive when taking Pain medications or sleeping medications (Benzodaizepines)  Do not take more than prescribed Pain, Sleep and Anxiety Medications. It is not advisable to combine anxiety,sleep and pain medications without talking with your primary care practitioner  Special Instructions: If you have smoked or chewed Tobacco  in the last 2 yrs please stop smoking, stop any regular Alcohol  and or any Recreational drug use.  Wear Seat belts while driving.  Please note: You were cared for by a hospitalist during your hospital stay. Once you are  discharged, your primary care physician will handle any further medical issues. Please note that NO REFILLS for any discharge medications will be authorized once you are discharged, as it is imperative that you return to your primary care physician (or establish a relationship with a primary care physician if you do not have one) for your post hospital discharge needs so that they can reassess your need for medications and monitor your lab values.      Community-Acquired Pneumonia, Adult Pneumonia is an infection of the lungs. One type of pneumonia can happen while a person is in a hospital. A different type can happen when a person is not in a hospital (community-acquired pneumonia). It is easy for this kind to spread from person to person. It can spread to you if you breathe near an infected person who coughs or sneezes. Some symptoms include:  A dry cough.  A wet (productive) cough.  Fever.  Sweating.  Chest pain.  Follow these instructions at home:  Take over-the-counter and prescription medicines only as told by your doctor. ? Only take cough medicine if you are losing sleep. ? If you were prescribed an antibiotic medicine, take it as told by your doctor. Do not stop taking the antibiotic even if you start to feel better.  Sleep with your head and neck raised (elevated). You can do this by putting a few pillows under your head, or you can sleep in a recliner.  Do not use tobacco products. These include cigarettes, chewing tobacco, and e-cigarettes. If you need help quitting, ask your doctor.  Drink enough water to keep your pee (urine) clear or pale yellow. A shot (vaccine)  can help prevent pneumonia. Shots are often suggested for:  People older than 48 years of age.  People older than 48 years of age: ? Who are having cancer treatment. ? Who have long-term (chronic) lung disease. ? Who have problems with their body's defense system (immune system).  You may also prevent  pneumonia if you take these actions:  Get the flu (influenza) shot every year.  Go to the dentist as often as told.  Wash your hands often. If soap and water are not available, use hand sanitizer.  Contact a doctor if:  You have a fever.  You lose sleep because your cough medicine does not help. Get help right away if:  You are short of breath and it gets worse.  You have more chest pain.  Your sickness gets worse. This is very serious if: ? You are an older adult. ? Your body's defense system is weak.  You cough up blood. This information is not intended to replace advice given to you by your health care provider. Make sure you discuss any questions you have with your health care provider. Document Released: 01/01/2008 Document Revised: 12/21/2015 Document Reviewed: 11/09/2014 Elsevier Interactive Patient Education  2018 [employer].   Nonspecific Chest Pain Chest pain can be caused by many different conditions. There is a chance that your pain could be related to something serious, such as a heart attack or a blood clot in your lungs. Chest pain can also be caused by conditions that are not life-threatening. If you have chest pain, it is very important to follow up with your doctor. Follow these instructions at home: Medicines  If you were prescribed an antibiotic medicine, take it as told by your doctor. Do not stop taking the antibiotic even if you start to feel better.  Take over-the-counter and prescription medicines only as told by your doctor. Lifestyle  Do not use any products that contain nicotine or tobacco, such as cigarettes and e-cigarettes. If you need help quitting, ask your doctor.  Do not drink alcohol.  Make lifestyle changes as told by your doctor. These may include: ? Getting regular exercise. Ask your doctor for some activities that are safe for you. ? Eating a heart-healthy diet. A diet specialist (dietitian) can help you to learn healthy eating  options. ? Staying at a healthy weight. ? Managing diabetes, if needed. ? Lowering your stress, as with deep breathing or spending time in nature. General instructions  Avoid any activities that make you feel chest pain.  If your chest pain is because of heartburn: ? Raise (elevate) the head of your bed about 6 inches (15 cm). You can do this by putting blocks under the bed legs at the head of the bed. ? Do not sleep with extra pillows under your head. That does not help heartburn.  Keep all follow-up visits as told by your doctor. This is important. This includes any further testing if your chest pain does not go away. Contact a doctor if:  Your chest pain does not go away.  You have a rash with blisters on your chest.  You have a fever.  You have chills. Get help right away if:  Your chest pain is worse.  You have a cough that gets worse, or you cough up blood.  You have very bad (severe) pain in your belly (abdomen).  You are very weak.  You pass out (faint).  You have either of these for no clear reason: ? Sudden  chest discomfort. ? Sudden discomfort in your arms, back, neck, or jaw.  You have shortness of breath at any time.  You suddenly start to sweat, or your skin gets clammy.  You feel sick to your stomach (nauseous).  You throw up (vomit).  You suddenly feel light-headed or dizzy.  Your heart starts to beat fast, or it feels like it is skipping beats. These symptoms may be an emergency. Do not wait to see if the symptoms will go away. Get medical help right away. Call your local emergency services (911 in the [place] Do not drive yourself to the hospital. This information is not intended to replace advice given to you by your health care provider. Make sure you discuss any questions you have with your health care provider. Document Released: 01/01/2008 Document Revised: 04/08/2016 Document Reviewed: 04/08/2016 Elsevier Interactive Patient Education  2017  [employer].

## 2017-09-18 ENCOUNTER — Other Ambulatory Visit: Payer: Self-pay | Admitting: Physician Assistant

## 2017-09-18 DIAGNOSIS — J181 Lobar pneumonia, unspecified organism: Principal | ICD-10-CM

## 2017-09-18 DIAGNOSIS — J189 Pneumonia, unspecified organism: Secondary | ICD-10-CM

## 2017-09-30 ENCOUNTER — Other Ambulatory Visit: Payer: Self-pay

## 2017-09-30 ENCOUNTER — Ambulatory Visit: Payer: Medicaid Other | Admitting: Physician Assistant

## 2017-09-30 ENCOUNTER — Encounter (HOSPITAL_COMMUNITY): Payer: Self-pay | Admitting: Emergency Medicine

## 2017-09-30 ENCOUNTER — Emergency Department (HOSPITAL_COMMUNITY)
Admission: EM | Admit: 2017-09-30 | Discharge: 2017-09-30 | Disposition: A | Discharge status: Home / Self Care | Payer: Medicaid Other | Attending: Emergency Medicine | Admitting: Emergency Medicine

## 2017-09-30 ENCOUNTER — Emergency Department (HOSPITAL_COMMUNITY): Payer: Medicaid Other

## 2017-09-30 DIAGNOSIS — R1030 Lower abdominal pain, unspecified: Secondary | ICD-10-CM

## 2017-09-30 DIAGNOSIS — R197 Diarrhea, unspecified: Secondary | ICD-10-CM

## 2017-09-30 DIAGNOSIS — I10 Essential (primary) hypertension: Secondary | ICD-10-CM | POA: Insufficient documentation

## 2017-09-30 DIAGNOSIS — Z7984 Long term (current) use of oral hypoglycemic drugs: Secondary | ICD-10-CM | POA: Insufficient documentation

## 2017-09-30 DIAGNOSIS — Z79899 Other long term (current) drug therapy: Secondary | ICD-10-CM | POA: Insufficient documentation

## 2017-09-30 DIAGNOSIS — E119 Type 2 diabetes mellitus without complications: Secondary | ICD-10-CM | POA: Insufficient documentation

## 2017-09-30 DIAGNOSIS — J45909 Unspecified asthma, uncomplicated: Secondary | ICD-10-CM | POA: Insufficient documentation

## 2017-09-30 DIAGNOSIS — K625 Hemorrhage of anus and rectum: Secondary | ICD-10-CM

## 2017-09-30 LAB — COMPREHENSIVE METABOLIC PANEL
ALBUMIN: 4.1 g/dL (ref 3.5–5.0)
ALT: 37 U/L (ref 14–54)
ANION GAP: 9 (ref 5–15)
AST: 27 U/L (ref 15–41)
Alkaline Phosphatase: 59 U/L (ref 38–126)
BILIRUBIN TOTAL: 0.2 mg/dL — AB (ref 0.3–1.2)
BUN: 11 mg/dL (ref 6–20)
CO2: 23 mmol/L (ref 22–32)
Calcium: 9.2 mg/dL (ref 8.9–10.3)
Chloride: 106 mmol/L (ref 101–111)
Creatinine, Ser: 0.97 mg/dL (ref 0.44–1.00)
GFR calc Af Amer: >60 mL/min (ref >60)
GFR calc non Af Amer: >60 mL/min (ref >60)
GLUCOSE: 121 mg/dL — AB (ref 65–99)
POTASSIUM: 3.6 mmol/L (ref 3.5–5.1)
SODIUM: 138 mmol/L (ref 135–145)
TOTAL PROTEIN: 8 g/dL (ref 6.5–8.1)

## 2017-09-30 LAB — URINALYSIS, ROUTINE W REFLEX MICROSCOPIC
BILIRUBIN URINE: NEGATIVE
Glucose, UA: NEGATIVE mg/dL
Hgb urine dipstick: NEGATIVE
KETONES UR: NEGATIVE mg/dL
Leukocytes, UA: NEGATIVE
NITRITE: NEGATIVE
PH: 5 (ref 5.0–8.0)
PROTEIN: NEGATIVE mg/dL
Specific Gravity, Urine: >1.046 — ABNORMAL HIGH (ref 1.005–1.030)

## 2017-09-30 LAB — LIPASE, BLOOD: Lipase: 42 U/L (ref 11–51)

## 2017-09-30 LAB — CBC
HEMATOCRIT: 39.3 % (ref 36.0–46.0)
Hemoglobin: 12 g/dL (ref 12.0–15.0)
MCH: 25.1 pg — ABNORMAL LOW (ref 26.0–34.0)
MCHC: 30.5 g/dL (ref 30.0–36.0)
MCV: 82.2 fL (ref 78.0–100.0)
PLATELETS: 372 10*3/uL (ref 150–400)
RBC: 4.78 MIL/uL (ref 3.87–5.11)
RDW: 15 % (ref 11.5–15.5)
WBC: 5.7 10*3/uL (ref 4.0–10.5)

## 2017-09-30 LAB — TYPE AND SCREEN
ABO/RH(D): O POS
ANTIBODY SCREEN: NEGATIVE

## 2017-09-30 MED ORDER — MORPHINE SULFATE (PF) 4 MG/ML IV SOLN
4.0000 mg | Freq: Once | INTRAVENOUS | Status: AC
  Administered 2017-09-30: 4 mg via INTRAVENOUS
  Filled 2017-09-30: qty 1

## 2017-09-30 MED ORDER — LOPERAMIDE HCL 2 MG PO CAPS: 2.0000 mg | ORAL_CAPSULE | Freq: Four times a day (QID) | ORAL | 0 refills | Status: DC | PRN

## 2017-09-30 MED ORDER — ONDANSETRON HCL 4 MG PO TABS: 4.0000 mg | ORAL_TABLET | Freq: Four times a day (QID) | ORAL | 0 refills | Status: DC

## 2017-09-30 MED ORDER — HYDROMORPHONE HCL 1 MG/ML IJ SOLN
1.0000 mg | Freq: Once | INTRAMUSCULAR | Status: AC
  Administered 2017-09-30: 1 mg via INTRAVENOUS
  Filled 2017-09-30: qty 1

## 2017-09-30 MED ORDER — IOPAMIDOL (ISOVUE-300) INJECTION 61%
125.0000 mL | Freq: Once | INTRAVENOUS | Status: AC | PRN
  Administered 2017-09-30: 120 mL via INTRAVENOUS

## 2017-09-30 MED ORDER — SODIUM CHLORIDE 0.9 % IV BOLUS (SEPSIS)
1000.0000 mL | Freq: Once | INTRAVENOUS | Status: AC
  Administered 2017-09-30: 1000 mL via INTRAVENOUS

## 2017-09-30 MED ORDER — ONDANSETRON HCL 4 MG/2ML IJ SOLN
4.0000 mg | Freq: Once | INTRAMUSCULAR | Status: AC
  Administered 2017-09-30: 4 mg via INTRAVENOUS
  Filled 2017-09-30: qty 2

## 2017-09-30 NOTE — ED Notes (Signed)
Pt did not have any stools while in room.
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
## 2017-09-30 NOTE — Discharge Instructions (Signed)
You were evaluated in the emergency department for diarrhea that has now turned into some rectal bleeding along with low abdominal pain.  Your lab work and CT scan was unremarkable.  We are prescribing you an antidiarrhea medicine and some nausea medicine and you should continue to work on getting your insurance in order so you can follow-up with GI.  If your symptoms worsen you should return to the emergency department.

## 2017-09-30 NOTE — ED Triage Notes (Signed)
Pt c/o of n/v/d and rectal bleeding x1 week.  Pt was recently placed on an antibiotic 2 weeks ago. No fever.

## 2017-09-30 NOTE — ED Notes (Addendum)
Patient stated she was slightly dizzy upon sitting and standing.  Patient has a history of HTN

## 2017-10-07 ENCOUNTER — Encounter: Payer: Self-pay | Admitting: Physician Assistant

## 2017-10-21 ENCOUNTER — Institutional Professional Consult (permissible substitution): Payer: Self-pay | Admitting: Neurology

## 2017-10-27 ENCOUNTER — Encounter: Payer: Self-pay | Admitting: Physician Assistant

## 2017-10-28 ENCOUNTER — Encounter: Payer: Self-pay | Admitting: Neurology

## 2017-11-04 ENCOUNTER — Other Ambulatory Visit (HOSPITAL_COMMUNITY)
Admission: RE | Admit: 2017-11-04 | Discharge: 2017-11-04 | Disposition: A | Discharge status: Home / Self Care | Payer: Medicaid Other | Source: Ambulatory Visit | Attending: Physician Assistant | Admitting: Physician Assistant

## 2017-11-04 DIAGNOSIS — I10 Essential (primary) hypertension: Secondary | ICD-10-CM

## 2017-11-04 DIAGNOSIS — E118 Type 2 diabetes mellitus with unspecified complications: Secondary | ICD-10-CM

## 2017-11-04 DIAGNOSIS — E785 Hyperlipidemia, unspecified: Secondary | ICD-10-CM | POA: Insufficient documentation

## 2017-11-04 DIAGNOSIS — N189 Chronic kidney disease, unspecified: Secondary | ICD-10-CM | POA: Insufficient documentation

## 2017-11-04 LAB — COMPREHENSIVE METABOLIC PANEL
ALK PHOS: 53 U/L (ref 38–126)
ALT: 30 U/L (ref 14–54)
AST: 26 U/L (ref 15–41)
Albumin: 4 g/dL (ref 3.5–5.0)
Anion gap: 11 (ref 5–15)
BUN: 10 mg/dL (ref 6–20)
CALCIUM: 9.1 mg/dL (ref 8.9–10.3)
CHLORIDE: 102 mmol/L (ref 101–111)
CO2: 23 mmol/L (ref 22–32)
Creatinine, Ser: 0.88 mg/dL (ref 0.44–1.00)
Glucose, Bld: 124 mg/dL — ABNORMAL HIGH (ref 65–99)
Potassium: 3.5 mmol/L (ref 3.5–5.1)
Sodium: 136 mmol/L (ref 135–145)
TOTAL PROTEIN: 7.6 g/dL (ref 6.5–8.1)
Total Bilirubin: 0.5 mg/dL (ref 0.3–1.2)

## 2017-11-04 LAB — LIPID PANEL
CHOLESTEROL: 201 mg/dL — AB (ref 0–200)
HDL: 55 mg/dL (ref >40)
LDL Cholesterol: 127 mg/dL — ABNORMAL HIGH (ref 0–99)
TRIGLYCERIDES: 96 mg/dL (ref <150)
Total CHOL/HDL Ratio: 3.7 RATIO
VLDL: 19 mg/dL (ref 0–40)

## 2017-11-04 LAB — HEMOGLOBIN A1C
Hgb A1c MFr Bld: 7.5 % — ABNORMAL HIGH (ref 4.8–5.6)
Mean Plasma Glucose: 168.55 mg/dL

## 2017-11-06 ENCOUNTER — Encounter: Payer: Self-pay | Admitting: Physician Assistant

## 2017-11-06 ENCOUNTER — Ambulatory Visit: Payer: Medicaid Other | Admitting: Physician Assistant

## 2017-11-06 VITALS — BP 132/97 | HR 72 | Temp 97.2°F | Ht 63.75 in | Wt 288.5 lb

## 2017-11-06 DIAGNOSIS — E785 Hyperlipidemia, unspecified: Secondary | ICD-10-CM

## 2017-11-06 DIAGNOSIS — E1165 Type 2 diabetes mellitus with hyperglycemia: Secondary | ICD-10-CM

## 2017-11-06 DIAGNOSIS — G8929 Other chronic pain: Secondary | ICD-10-CM

## 2017-11-06 DIAGNOSIS — I10 Essential (primary) hypertension: Secondary | ICD-10-CM

## 2017-11-06 DIAGNOSIS — F319 Bipolar disorder, unspecified: Secondary | ICD-10-CM

## 2017-11-06 DIAGNOSIS — M545 Low back pain: Secondary | ICD-10-CM

## 2017-11-06 DIAGNOSIS — R0602 Shortness of breath: Secondary | ICD-10-CM

## 2017-11-06 MED ORDER — ATORVASTATIN CALCIUM 40 MG PO TABS: 40.0000 mg | ORAL_TABLET | Freq: Every day | ORAL | 1 refills | Status: DC

## 2017-11-06 MED ORDER — PREDNISONE 20 MG PO TABS: 20.0000 mg | ORAL_TABLET | Freq: Two times a day (BID) | ORAL | 0 refills | Status: AC

## 2017-11-06 MED ORDER — ALBUTEROL SULFATE (2.5 MG/3ML) 0.083% IN NEBU
2.5000 mg | INHALATION_SOLUTION | Freq: Once | RESPIRATORY_TRACT | Status: AC
  Administered 2017-11-06: 2.5 mg via RESPIRATORY_TRACT

## 2017-11-06 MED ORDER — DICLOFENAC SODIUM 75 MG PO TBEC: 75.0000 mg | DELAYED_RELEASE_TABLET | Freq: Two times a day (BID) | ORAL | 1 refills | Status: DC | PRN

## 2017-11-06 NOTE — Progress Notes (Signed)
BP (!) 132/97 (BP Location: Right Arm, Patient Position: Sitting, Cuff Size: Normal)   Pulse 72   Temp (!) 97.2 F (36.2 C) (Oral)   Ht 5' 3.75" (1.619 m)   Wt 288 lb 8 oz (130.9 kg)   LMP 07/11/2014 (Exact Date)   SpO2 96%   BMI 49.91 kg/m    Subjective:    Patient ID: Patricia [name], female    DOB: [birth year], 47 y.o.   MRN: [id number]  HPI: Patricia [name] is a 47 y.o. female presenting on 11/06/2017 for Diabetes; Hypertension; and Hyperlipidemia   HPI    Pt c/o extreme lower back pain and dizziness, she says she fell in shower 3 times, and she complains that she has no appetite and has lost some weight  At last OV pt was referred to orthopedics for back pain.  She was given another charity care application (since she did not turn in the one that she had been given previously).   Pt says she turned in her charity care application but they are waiting to see if she gets approved for medicaid before she get approved for the charity care.    She says other people have reported that she is :"walking wobbly".   Pt reports no problems with her blood pressure or blood sugars  Pt says she wants to get a breathing treatment because she doesn't have a machine at home and she insists that we give her a shot for pain before she leaves today.  She was told that a nebulizer treatment is fine but that we have no pain medicine on site except tylenol and aleve.  She was very disappointed to hear that.   Relevant past medical, surgical, family and social history reviewed and updated as indicated. Interim medical history since our last visit reviewed. Allergies and medications reviewed and updated.   Current Outpatient Medications:  .  albuterol (PROVENTIL HFA;VENTOLIN HFA) 108 (90 Base) MCG/ACT inhaler, Inhale 2 puffs into the lungs every 6 (six) hours as needed for wheezing or shortness of breath., Disp: 2 Inhaler, Rfl: 0 .  amLODipine (NORVASC) 5 MG tablet, Take 1 tablet (5 mg total)  by mouth daily., Disp: 90 tablet, Rfl: 1 .  atorvastatin (LIPITOR) 20 MG tablet, Take 1 tablet (20 mg total) by mouth daily., Disp: 90 tablet, Rfl: 1 .  cloNIDine (CATAPRES) 0.3 MG tablet, Take 1 tablet (0.3 mg total) by mouth 2 (two) times daily. For high blood pressure, Disp: 180 tablet, Rfl: 0 .  docusate sodium (COLACE) 100 MG capsule, Take 1 capsule (100 mg total) by mouth 2 (two) times daily as needed for mild constipation., Disp: 1 capsule, Rfl: 0 .  gabapentin (NEURONTIN) 400 MG capsule, Take 1 capsule (400 mg total) by mouth 3 (three) times daily. For agitation, Disp: , Rfl:  .  lidocaine (LIDODERM) 5 %, Place 2 patches onto the skin daily. Remove & Discard patch within 12 hours or as directed by MD: For pain management, Disp: 20 patch, Rfl: 0 .  loperamide (IMODIUM) 2 MG capsule, Take 1 capsule (2 mg total) by mouth 4 (four) times daily as needed for diarrhea or loose stools., Disp: 20 capsule, Rfl: 0 .  metFORMIN (GLUCOPHAGE) 500 MG tablet, Take 1 tablet (500 mg total) by mouth 2 (two) times daily with a meal., Disp: 180 tablet, Rfl: 1 .  metoprolol tartrate (LOPRESSOR) 100 MG tablet, Take 1 tablet (100 mg total) by mouth 2 (two) times daily. (this replaces atenolol),  Disp: 180 tablet, Rfl: 1 .  montelukast (SINGULAIR) 10 MG tablet, Take 1 tablet (10 mg total) by mouth daily. For shortness of breath, Disp: 90 tablet, Rfl: 1 .  omeprazole (PRILOSEC) 40 MG capsule, TAKE 1 Capsule BY MOUTH EVERY EVENING, Disp: 90 capsule, Rfl: 1 .  QUEtiapine (SEROQUEL) 400 MG tablet, Take 2 tablets (800 mg total) by mouth at bedtime. For mood control, Disp: 60 tablet, Rfl: 0 .  traZODone (DESYREL) 50 MG tablet, Take 1 tablet (50 mg total) by mouth at bedtime as needed for sleep. (Patient taking differently: Take 200 mg by mouth daily after supper. ), Disp: 30 tablet, Rfl: 0 .  diclofenac (VOLTAREN) 75 MG EC tablet, Take 1 tablet (75 mg total) by mouth 2 (two) times daily as needed. (Patient not taking: Reported  on 11/06/2017), Disp: 60 tablet, Rfl: 0 .  guaiFENesin-dextromethorphan (ROBITUSSIN DM) 100-10 MG/5ML syrup, Take 10 mLs by mouth every 4 (four) hours as needed for cough. (Patient not taking: Reported on 11/06/2017), Disp: 118 mL, Rfl: 0 .  hydrOXYzine (ATARAX/VISTARIL) 25 MG tablet, Take 1 tablet (25 mg total) by mouth 3 (three) times daily as needed for anxiety. (Patient not taking: Reported on 11/06/2017), Disp: 60 tablet, Rfl: 0 .  methocarbamol (ROBAXIN) 500 MG tablet, Take 1 tablet (500 mg total) by mouth 2 (two) times daily. (Patient not taking: Reported on 11/06/2017), Disp: 20 tablet, Rfl: 0 .  ondansetron (ZOFRAN) 4 MG tablet, Take 1 tablet (4 mg total) by mouth every 6 (six) hours. (Patient not taking: Reported on 11/06/2017), Disp: 12 tablet, Rfl: 0 .  phenylephrine-shark liver oil-mineral oil-petrolatum (PREPARATION H) 0.25-3-14-71.9 % rectal ointment, Place rectally 2 (two) times daily as needed for hemorrhoids. (Patient not taking: Reported on 11/06/2017), Disp: 30 g, Rfl: 0   Review of Systems  Constitutional: Positive for appetite change, fatigue and unexpected weight change. Negative for chills, diaphoresis and fever.  HENT: Negative for congestion, dental problem, drooling, ear pain, facial swelling, hearing loss, mouth sores, sneezing, sore throat, trouble swallowing and voice change.   Eyes: Negative for pain, discharge, redness, itching and visual disturbance.  Respiratory: Positive for cough, shortness of breath and wheezing. Negative for choking.   Cardiovascular: Negative for chest pain, palpitations and leg swelling.  Gastrointestinal: Negative for abdominal pain, blood in stool, constipation, diarrhea and vomiting.  Endocrine: Negative for cold intolerance, heat intolerance and polydipsia.  Genitourinary: Negative for decreased urine volume, dysuria and hematuria.  Musculoskeletal: Positive for back pain and gait problem. Negative for arthralgias.  Skin: Negative for rash.   Allergic/Immunologic: Positive for environmental allergies.  Neurological: Positive for headaches. Negative for seizures, syncope and light-headedness.  Hematological: Negative for adenopathy.  Psychiatric/Behavioral: Negative for agitation, dysphoric mood and suicidal ideas. The patient is not nervous/anxious.     Per HPI unless specifically indicated above     Objective:    BP (!) 132/97 (BP Location: Right Arm, Patient Position: Sitting, Cuff Size: Normal)   Pulse 72   Temp (!) 97.2 F (36.2 C) (Oral)   Ht 5' 3.75" (1.619 m)   Wt 288 lb 8 oz (130.9 kg)   LMP 07/11/2014 (Exact Date)   SpO2 96%   BMI 49.91 kg/m   Wt Readings from Last 3 Encounters:  11/06/17 288 lb 8 oz (130.9 kg)  09/30/17 299 lb (135.6 kg)  09/13/17 299 lb 13.2 oz (136 kg)    Physical Exam  Constitutional: She is oriented to person, place, and time. She appears well-developed and well-nourished.  HENT:  Head: Normocephalic and atraumatic.  Neck: Neck supple.  Cardiovascular: Normal rate and regular rhythm.  Pulmonary/Chest: Effort normal and breath sounds normal.  Abdominal: Soft. Bowel sounds are normal. She exhibits no mass. There is no hepatosplenomegaly. There is no tenderness.  Musculoskeletal: She exhibits no edema.       Cervical back: She exhibits tenderness. She exhibits normal range of motion, no bony tenderness and no deformity.       Thoracic back: She exhibits tenderness. She exhibits normal range of motion, no bony tenderness, no deformity and no spasm.       Lumbar back: She exhibits tenderness. She exhibits normal range of motion, no bony tenderness, no deformity and no spasm.  Pt tender all over. No point tenderness.  No bony tenderness.   Lymphadenopathy:    She has no cervical adenopathy.  Neurological: She is alert and oriented to person, place, and time.  Skin: Skin is warm and dry.  Psychiatric: Her behavior is normal. Her mood appears anxious.  Pt is excessive- she bursts into  tears one minute and the next she will be fine.  She wails and moans during exam but then will stop to say something and be perfectly fine.  When she is done talking, she resumes the moaning and wailing and crying.     Vitals reviewed.   Results for orders placed or performed during the hospital encounter of 11/04/17  Comprehensive metabolic panel  Result Value Ref Range   Sodium 136 135 - 145 mmol/L   Potassium 3.5 3.5 - 5.1 mmol/L   Chloride 102 101 - 111 mmol/L   CO2 23 22 - 32 mmol/L   Glucose, Bld 124 (H) 65 - 99 mg/dL   BUN 10 6 - 20 mg/dL   Creatinine, Ser 0.88 0.44 - 1.00 mg/dL   Calcium 9.1 8.9 - 10.3 mg/dL   Total Protein 7.6 6.5 - 8.1 g/dL   Albumin 4.0 3.5 - 5.0 g/dL   AST 26 15 - 41 U/L   ALT 30 14 - 54 U/L   Alkaline Phosphatase 53 38 - 126 U/L   Total Bilirubin 0.5 0.3 - 1.2 mg/dL   GFR calc non Af Amer >60 >60 mL/min   GFR calc Af Amer >60 >60 mL/min   Anion gap 11 5 - 15  Lipid panel  Result Value Ref Range   Cholesterol 201 (H) 0 - 200 mg/dL   Triglycerides 96 <150 mg/dL   HDL 55 >40 mg/dL   Total CHOL/HDL Ratio 3.7 RATIO   VLDL 19 0 - 40 mg/dL   LDL Cholesterol 127 (H) 0 - 99 mg/dL  Hemoglobin A1c  Result Value Ref Range   Hgb A1c MFr Bld 7.5 (H) 4.8 - 5.6 %   Mean Plasma Glucose 168.55 mg/dL      Assessment & Plan:   Encounter Diagnoses  Name Primary?  . Essential hypertension Yes  . SOB (shortness of breath)   . Hyperlipidemia, unspecified hyperlipidemia type   . Chronic low back pain, unspecified back pain laterality, with sciatica presence unspecified   . Morbid obesity ([place])   . Uncontrolled type 2 diabetes mellitus with hyperglycemia ([place])   . Bipolar affective disorder, remission status unspecified ([place])     -reviewed labs with pt -Increase atorvastatin to 40mg  -rx prednisone for back and refill diclofenac -pt to watch diabetic diet.  Discussed with pt that the steroids will increase the bs so she should be aware of that.   -discussed  with pt that she has already been referred to orthopedics and that is who she needs to see for her back.  She can call when she gets approval notice on her charity care. -she is to follow up 3 months.  RTO sooner prn

## 2017-11-27 ENCOUNTER — Other Ambulatory Visit: Payer: Self-pay | Admitting: Physician Assistant

## 2017-12-25 ENCOUNTER — Other Ambulatory Visit: Payer: Self-pay | Admitting: Physician Assistant

## 2018-01-01 ENCOUNTER — Encounter (HOSPITAL_COMMUNITY): Payer: Self-pay | Admitting: Emergency Medicine

## 2018-01-01 ENCOUNTER — Other Ambulatory Visit: Payer: Self-pay

## 2018-01-01 ENCOUNTER — Emergency Department (HOSPITAL_COMMUNITY)
Admission: EM | Admit: 2018-01-01 | Discharge: 2018-01-01 | Disposition: A | Discharge status: Home / Self Care | Payer: Medicaid Other | Attending: Emergency Medicine | Admitting: Emergency Medicine

## 2018-01-01 DIAGNOSIS — I10 Essential (primary) hypertension: Secondary | ICD-10-CM | POA: Insufficient documentation

## 2018-01-01 DIAGNOSIS — M545 Low back pain, unspecified: Secondary | ICD-10-CM

## 2018-01-01 DIAGNOSIS — Z79899 Other long term (current) drug therapy: Secondary | ICD-10-CM | POA: Insufficient documentation

## 2018-01-01 DIAGNOSIS — Z91010 Allergy to peanuts: Secondary | ICD-10-CM | POA: Insufficient documentation

## 2018-01-01 DIAGNOSIS — Z7984 Long term (current) use of oral hypoglycemic drugs: Secondary | ICD-10-CM | POA: Insufficient documentation

## 2018-01-01 DIAGNOSIS — Z87891 Personal history of nicotine dependence: Secondary | ICD-10-CM | POA: Insufficient documentation

## 2018-01-01 DIAGNOSIS — E119 Type 2 diabetes mellitus without complications: Secondary | ICD-10-CM | POA: Insufficient documentation

## 2018-01-01 MED ORDER — METHYLPREDNISOLONE SODIUM SUCC 125 MG IJ SOLR
125.0000 mg | Freq: Once | INTRAMUSCULAR | Status: AC
  Administered 2018-01-01: 125 mg via INTRAMUSCULAR
  Filled 2018-01-01: qty 2

## 2018-01-01 MED ORDER — HYDROCODONE-ACETAMINOPHEN 5-325 MG PO TABS: 1.0000 | ORAL_TABLET | ORAL | 0 refills | Status: DC | PRN

## 2018-01-01 MED ORDER — DICLOFENAC SODIUM 50 MG PO TBEC: 50.0000 mg | DELAYED_RELEASE_TABLET | Freq: Two times a day (BID) | ORAL | 0 refills | Status: DC

## 2018-01-01 MED ORDER — KETOROLAC TROMETHAMINE 30 MG/ML IJ SOLN
30.0000 mg | Freq: Once | INTRAMUSCULAR | Status: AC
  Administered 2018-01-01: 30 mg via INTRAMUSCULAR
  Filled 2018-01-01: qty 1

## 2018-01-01 MED ORDER — METHOCARBAMOL 500 MG PO TABS: 500.0000 mg | ORAL_TABLET | Freq: Two times a day (BID) | ORAL | 0 refills | Status: DC

## 2018-01-01 NOTE — ED Triage Notes (Signed)
Pt states has slipped disk and back gave out Monday. States stil hurting across lower back and mid right side back. Denies radiation. Pain is worse with movement. Denies gu sx.

## 2018-01-01 NOTE — Discharge Instructions (Signed)
Return if any problems.  See your Physicain for recheck  
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
## 2018-01-01 NOTE — ED Notes (Signed)
Note pt states her pain is 10/10, while gathering her belongings, pt bent over bed to reach for her phone charger without any difficultly

## 2018-01-27 ENCOUNTER — Other Ambulatory Visit: Payer: Self-pay | Admitting: Physician Assistant

## 2018-02-05 ENCOUNTER — Ambulatory Visit: Payer: Medicaid Other | Admitting: Physician Assistant

## 2018-02-09 ENCOUNTER — Encounter: Payer: Self-pay | Admitting: Physician Assistant

## 2018-02-11 ENCOUNTER — Other Ambulatory Visit: Payer: Self-pay

## 2018-02-11 ENCOUNTER — Emergency Department (HOSPITAL_COMMUNITY)
Admission: EM | Admit: 2018-02-11 | Discharge: 2018-02-11 | Disposition: A | Discharge status: Home / Self Care | Payer: Self-pay | Attending: Emergency Medicine | Admitting: Emergency Medicine

## 2018-02-11 ENCOUNTER — Encounter (HOSPITAL_COMMUNITY): Payer: Self-pay | Admitting: Emergency Medicine

## 2018-02-11 DIAGNOSIS — R112 Nausea with vomiting, unspecified: Secondary | ICD-10-CM | POA: Insufficient documentation

## 2018-02-11 DIAGNOSIS — K529 Noninfective gastroenteritis and colitis, unspecified: Secondary | ICD-10-CM | POA: Insufficient documentation

## 2018-02-11 DIAGNOSIS — Z91010 Allergy to peanuts: Secondary | ICD-10-CM | POA: Insufficient documentation

## 2018-02-11 DIAGNOSIS — I10 Essential (primary) hypertension: Secondary | ICD-10-CM | POA: Insufficient documentation

## 2018-02-11 DIAGNOSIS — Z79899 Other long term (current) drug therapy: Secondary | ICD-10-CM | POA: Insufficient documentation

## 2018-02-11 DIAGNOSIS — J45909 Unspecified asthma, uncomplicated: Secondary | ICD-10-CM | POA: Insufficient documentation

## 2018-02-11 DIAGNOSIS — E119 Type 2 diabetes mellitus without complications: Secondary | ICD-10-CM | POA: Insufficient documentation

## 2018-02-11 DIAGNOSIS — Z7984 Long term (current) use of oral hypoglycemic drugs: Secondary | ICD-10-CM | POA: Insufficient documentation

## 2018-02-11 DIAGNOSIS — Z87891 Personal history of nicotine dependence: Secondary | ICD-10-CM | POA: Insufficient documentation

## 2018-02-11 LAB — CBC WITH DIFFERENTIAL/PLATELET
Basophils Absolute: 0 10*3/uL (ref 0.0–0.1)
Basophils Relative: 0 %
EOS ABS: 0.1 10*3/uL (ref 0.0–0.7)
Eosinophils Relative: 1 %
HCT: 36.4 % (ref 36.0–46.0)
HEMOGLOBIN: 11.6 g/dL — AB (ref 12.0–15.0)
LYMPHS ABS: 2.6 10*3/uL (ref 0.7–4.0)
LYMPHS PCT: 43 %
MCH: 26.1 pg (ref 26.0–34.0)
MCHC: 31.9 g/dL (ref 30.0–36.0)
MCV: 82 fL (ref 78.0–100.0)
Monocytes Absolute: 0.5 10*3/uL (ref 0.1–1.0)
Monocytes Relative: 8 %
NEUTROS PCT: 48 %
Neutro Abs: 2.9 10*3/uL (ref 1.7–7.7)
Platelets: 261 10*3/uL (ref 150–400)
RBC: 4.44 MIL/uL (ref 3.87–5.11)
RDW: 15.3 % (ref 11.5–15.5)
WBC: 6.1 10*3/uL (ref 4.0–10.5)

## 2018-02-11 LAB — URINALYSIS, ROUTINE W REFLEX MICROSCOPIC
BILIRUBIN URINE: NEGATIVE
Glucose, UA: NEGATIVE mg/dL
Hgb urine dipstick: NEGATIVE
KETONES UR: NEGATIVE mg/dL
LEUKOCYTES UA: NEGATIVE
NITRITE: NEGATIVE
PH: 6 (ref 5.0–8.0)
PROTEIN: NEGATIVE mg/dL
Specific Gravity, Urine: 1.006 (ref 1.005–1.030)

## 2018-02-11 LAB — COMPREHENSIVE METABOLIC PANEL
ALK PHOS: 62 U/L (ref 38–126)
ALT: 29 U/L (ref 0–44)
ANION GAP: 7 (ref 5–15)
AST: 23 U/L (ref 15–41)
Albumin: 3.6 g/dL (ref 3.5–5.0)
BILIRUBIN TOTAL: 0.4 mg/dL (ref 0.3–1.2)
BUN: 7 mg/dL (ref 6–20)
CALCIUM: 8.4 mg/dL — AB (ref 8.9–10.3)
CO2: 23 mmol/L (ref 22–32)
CREATININE: 0.97 mg/dL (ref 0.44–1.00)
Chloride: 109 mmol/L (ref 98–111)
Glucose, Bld: 159 mg/dL — ABNORMAL HIGH (ref 70–99)
Potassium: 3.3 mmol/L — ABNORMAL LOW (ref 3.5–5.1)
Sodium: 139 mmol/L (ref 135–145)
TOTAL PROTEIN: 6.8 g/dL (ref 6.5–8.1)

## 2018-02-11 LAB — CBG MONITORING, ED: Glucose-Capillary: 154 mg/dL — ABNORMAL HIGH (ref 70–99)

## 2018-02-11 LAB — LIPASE, BLOOD: LIPASE: 31 U/L (ref 11–51)

## 2018-02-11 LAB — PREGNANCY, URINE: Preg Test, Ur: NEGATIVE

## 2018-02-11 MED ORDER — POTASSIUM CHLORIDE ER 10 MEQ PO TBCR: 10.0000 meq | EXTENDED_RELEASE_TABLET | Freq: Every day | ORAL | 0 refills | Status: DC

## 2018-02-11 MED ORDER — METOCLOPRAMIDE HCL 5 MG/ML IJ SOLN
10.0000 mg | Freq: Once | INTRAMUSCULAR | Status: AC
  Administered 2018-02-11: 10 mg via INTRAVENOUS
  Filled 2018-02-11: qty 2

## 2018-02-11 MED ORDER — MORPHINE SULFATE (PF) 4 MG/ML IV SOLN
4.0000 mg | Freq: Once | INTRAVENOUS | Status: AC
  Administered 2018-02-11: 4 mg via INTRAVENOUS
  Filled 2018-02-11: qty 1

## 2018-02-11 MED ORDER — ONDANSETRON 4 MG PO TBDP: 4.0000 mg | ORAL_TABLET | Freq: Three times a day (TID) | ORAL | 0 refills | Status: DC | PRN

## 2018-02-11 MED ORDER — SODIUM CHLORIDE 0.9 % IV BOLUS
1000.0000 mL | Freq: Once | INTRAVENOUS | Status: AC
  Administered 2018-02-11: 1000 mL via INTRAVENOUS

## 2018-02-11 NOTE — ED Triage Notes (Addendum)
Pt states generalized weakness and decreased appetite. Has been taking her home medications including metoprolol, clonidine, and norvasc. Upon standing pt became dizzy and BP was "70 palp". Was given 4mg  Zofran and 358mL NS.
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
## 2018-02-11 NOTE — Discharge Instructions (Signed)
Your testing shows only degenerative a very slightly low potassium. He may take 1 potassium pill a day for the next 7 days and then have her level rechecked. All of your other tests were reassuring. Drink plenty of fluids. Zofran as needed for nausea every 6 hours.  Please obtain all of your results from medical records or have your doctors office obtain the results - share them with your doctor - you should be seen at your doctors office in the next 2 days. Call today to arrange your follow up. Take the medications as prescribed. Please review all of the medicines and only take them if you do not have an allergy to them. Please be aware that if you are taking birth control pills, taking other prescriptions, ESPECIALLY ANTIBIOTICS may make the birth control ineffective - if this is the case, either do not engage in sexual activity or use alternative methods of birth control such as condoms until you have finished the medicine and your family doctor says it is OK to restart them. If you are on a blood thinner such as COUMADIN, be aware that any other medicine that you take may cause the coumadin to either work too much, or not enough - you should have your coumadin level rechecked in next 7 days if this is the case.  ?  It is also a possibility that you have an allergic reaction to any of the medicines that you have been prescribed - Everybody reacts differently to medications and while MOST people have no trouble with most medicines, you may have a reaction such as nausea, vomiting, rash, swelling, shortness of breath. If this is the case, please stop taking the medicine immediately and contact your physician.  ?  You should return to the ER if you develop severe or worsening symptoms.

## 2018-02-11 NOTE — ED Notes (Signed)
Advised patient we needed urine specimen.  Patient advised she used restroom prior to leaving home with EMS.  She will let us know when she can go to the bathroom.

## 2018-02-11 NOTE — ED Notes (Signed)
Pt tolerating PO fluids at this time.

## 2018-02-12 ENCOUNTER — Other Ambulatory Visit: Payer: Self-pay | Admitting: Physician Assistant

## 2018-02-13 MED FILL — Ondansetron HCl Tab 4 MG: ORAL | Qty: 4 | Status: AC

## 2018-04-03 ENCOUNTER — Other Ambulatory Visit: Payer: Self-pay | Admitting: Physician Assistant

## 2018-04-22 ENCOUNTER — Other Ambulatory Visit: Payer: Self-pay

## 2018-04-22 ENCOUNTER — Emergency Department (HOSPITAL_COMMUNITY): Payer: Medicaid Other

## 2018-04-22 ENCOUNTER — Encounter (HOSPITAL_COMMUNITY): Payer: Self-pay

## 2018-04-22 ENCOUNTER — Emergency Department (HOSPITAL_COMMUNITY)
Admission: EM | Admit: 2018-04-22 | Discharge: 2018-04-24 | Disposition: A | Discharge status: Other Acute Inpatient Hospital | Payer: Medicaid Other | Attending: Family Medicine | Admitting: Family Medicine

## 2018-04-22 DIAGNOSIS — F209 Schizophrenia, unspecified: Secondary | ICD-10-CM | POA: Insufficient documentation

## 2018-04-22 DIAGNOSIS — F1910 Other psychoactive substance abuse, uncomplicated: Secondary | ICD-10-CM

## 2018-04-22 DIAGNOSIS — E119 Type 2 diabetes mellitus without complications: Secondary | ICD-10-CM | POA: Insufficient documentation

## 2018-04-22 DIAGNOSIS — R51 Headache: Secondary | ICD-10-CM | POA: Diagnosis not present

## 2018-04-22 DIAGNOSIS — Z91010 Allergy to peanuts: Secondary | ICD-10-CM | POA: Insufficient documentation

## 2018-04-22 DIAGNOSIS — Z7984 Long term (current) use of oral hypoglycemic drugs: Secondary | ICD-10-CM | POA: Diagnosis not present

## 2018-04-22 DIAGNOSIS — F1220 Cannabis dependence, uncomplicated: Secondary | ICD-10-CM | POA: Diagnosis not present

## 2018-04-22 DIAGNOSIS — Z87891 Personal history of nicotine dependence: Secondary | ICD-10-CM | POA: Diagnosis not present

## 2018-04-22 DIAGNOSIS — J45909 Unspecified asthma, uncomplicated: Secondary | ICD-10-CM | POA: Insufficient documentation

## 2018-04-22 DIAGNOSIS — R45851 Suicidal ideations: Secondary | ICD-10-CM | POA: Insufficient documentation

## 2018-04-22 DIAGNOSIS — F1420 Cocaine dependence, uncomplicated: Secondary | ICD-10-CM | POA: Insufficient documentation

## 2018-04-22 DIAGNOSIS — I10 Essential (primary) hypertension: Secondary | ICD-10-CM | POA: Insufficient documentation

## 2018-04-22 DIAGNOSIS — Z79899 Other long term (current) drug therapy: Secondary | ICD-10-CM | POA: Diagnosis not present

## 2018-04-22 LAB — CBC WITH DIFFERENTIAL/PLATELET
BASOS PCT: 1 %
Basophils Absolute: 0 10*3/uL (ref 0.0–0.1)
Eosinophils Absolute: 0.1 10*3/uL (ref 0.0–0.7)
Eosinophils Relative: 2 %
HEMATOCRIT: 39.2 % (ref 36.0–46.0)
Hemoglobin: 12.6 g/dL (ref 12.0–15.0)
LYMPHS ABS: 2.9 10*3/uL (ref 0.7–4.0)
LYMPHS PCT: 45 %
MCH: 27 pg (ref 26.0–34.0)
MCHC: 32.1 g/dL (ref 30.0–36.0)
MCV: 83.9 fL (ref 78.0–100.0)
MONOS PCT: 6 %
Monocytes Absolute: 0.4 10*3/uL (ref 0.1–1.0)
NEUTROS ABS: 2.9 10*3/uL (ref 1.7–7.7)
Neutrophils Relative %: 46 %
Platelets: 297 10*3/uL (ref 150–400)
RBC: 4.67 MIL/uL (ref 3.87–5.11)
RDW: 15.1 % (ref 11.5–15.5)
WBC: 6.3 10*3/uL (ref 4.0–10.5)

## 2018-04-22 LAB — RAPID URINE DRUG SCREEN, HOSP PERFORMED
Amphetamines: NOT DETECTED
BARBITURATES: NOT DETECTED
Benzodiazepines: NOT DETECTED
COCAINE: POSITIVE — AB
Opiates: NOT DETECTED
TETRAHYDROCANNABINOL: POSITIVE — AB

## 2018-04-22 LAB — ETHANOL

## 2018-04-22 LAB — BASIC METABOLIC PANEL
Anion gap: 6 (ref 5–15)
BUN: 12 mg/dL (ref 6–20)
CHLORIDE: 109 mmol/L (ref 98–111)
CO2: 25 mmol/L (ref 22–32)
CREATININE: 0.99 mg/dL (ref 0.44–1.00)
Calcium: 9.1 mg/dL (ref 8.9–10.3)
GFR calc Af Amer: >60 mL/min (ref >60)
GFR calc non Af Amer: >60 mL/min (ref >60)
GLUCOSE: 106 mg/dL — AB (ref 70–99)
POTASSIUM: 4.3 mmol/L (ref 3.5–5.1)
SODIUM: 140 mmol/L (ref 135–145)

## 2018-04-22 LAB — ACETAMINOPHEN LEVEL: Acetaminophen (Tylenol), Serum: <10 ug/mL — ABNORMAL LOW (ref 10–30)

## 2018-04-22 LAB — SALICYLATE LEVEL: Salicylate Lvl: <7 mg/dL (ref 2.8–30.0)

## 2018-04-22 MED ORDER — ACETAMINOPHEN 325 MG PO TABS
650.0000 mg | ORAL_TABLET | Freq: Once | ORAL | Status: AC
  Administered 2018-04-22: 650 mg via ORAL
  Filled 2018-04-22: qty 2

## 2018-04-22 MED ORDER — IBUPROFEN 400 MG PO TABS
400.0000 mg | ORAL_TABLET | Freq: Once | ORAL | Status: AC
  Administered 2018-04-22: 400 mg via ORAL
  Filled 2018-04-22: qty 1

## 2018-04-22 MED ORDER — METHOCARBAMOL 500 MG PO TABS
1000.0000 mg | ORAL_TABLET | Freq: Once | ORAL | Status: AC
  Administered 2018-04-22: 1000 mg via ORAL
  Filled 2018-04-22: qty 2
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
## 2018-04-22 NOTE — ED Notes (Signed)
Sitter/security notified of pt's change in status. Pt given psych scrubs.
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
## 2018-04-22 NOTE — ED Notes (Signed)
Pt seen by [place] pd upon arrival to er.

## 2018-04-23 ENCOUNTER — Encounter (HOSPITAL_COMMUNITY): Payer: Self-pay | Admitting: Registered Nurse

## 2018-04-23 LAB — CBG MONITORING, ED
GLUCOSE-CAPILLARY: 135 mg/dL — AB (ref 70–99)
Glucose-Capillary: 165 mg/dL — ABNORMAL HIGH (ref 70–99)

## 2018-04-23 MED ORDER — TRAZODONE HCL 50 MG PO TABS
200.0000 mg | ORAL_TABLET | Freq: Every day | ORAL | Status: DC
  Administered 2018-04-23 (×2): 200 mg via ORAL
  Filled 2018-04-23 (×2): qty 4

## 2018-04-23 MED ORDER — ATORVASTATIN CALCIUM 40 MG PO TABS
40.0000 mg | ORAL_TABLET | Freq: Every day | ORAL | Status: DC
  Administered 2018-04-23 – 2018-04-24 (×2): 40 mg via ORAL
  Filled 2018-04-23 (×2): qty 1

## 2018-04-23 MED ORDER — METOPROLOL TARTRATE 50 MG PO TABS
100.0000 mg | ORAL_TABLET | Freq: Two times a day (BID) | ORAL | Status: DC
  Administered 2018-04-23 – 2018-04-24 (×3): 100 mg via ORAL
  Filled 2018-04-23 (×4): qty 2

## 2018-04-23 MED ORDER — QUETIAPINE FUMARATE 100 MG PO TABS
800.0000 mg | ORAL_TABLET | Freq: Every day | ORAL | Status: DC
  Administered 2018-04-23 (×2): 800 mg via ORAL
  Filled 2018-04-23 (×2): qty 8

## 2018-04-23 MED ORDER — GABAPENTIN 400 MG PO CAPS
400.0000 mg | ORAL_CAPSULE | Freq: Three times a day (TID) | ORAL | Status: DC
  Administered 2018-04-23 – 2018-04-24 (×5): 400 mg via ORAL
  Filled 2018-04-23 (×5): qty 1

## 2018-04-23 MED ORDER — IBUPROFEN 400 MG PO TABS
600.0000 mg | ORAL_TABLET | Freq: Three times a day (TID) | ORAL | Status: DC | PRN
  Administered 2018-04-23 (×2): 600 mg via ORAL
  Filled 2018-04-23 (×3): qty 2

## 2018-04-23 MED ORDER — AMLODIPINE BESYLATE 5 MG PO TABS
5.0000 mg | ORAL_TABLET | Freq: Every day | ORAL | Status: DC
  Administered 2018-04-23 – 2018-04-24 (×2): 5 mg via ORAL
  Filled 2018-04-23 (×2): qty 1

## 2018-04-23 MED ORDER — MONTELUKAST SODIUM 10 MG PO TABS
10.0000 mg | ORAL_TABLET | Freq: Every day | ORAL | Status: DC
  Administered 2018-04-23 – 2018-04-24 (×2): 10 mg via ORAL
  Filled 2018-04-23 (×3): qty 1

## 2018-04-23 MED ORDER — PANTOPRAZOLE SODIUM 40 MG PO TBEC
80.0000 mg | DELAYED_RELEASE_TABLET | Freq: Every day | ORAL | Status: DC
  Administered 2018-04-23 – 2018-04-24 (×2): 80 mg via ORAL
  Filled 2018-04-23 (×2): qty 2

## 2018-04-23 MED ORDER — ACETAMINOPHEN 325 MG PO TABS
650.0000 mg | ORAL_TABLET | Freq: Four times a day (QID) | ORAL | Status: DC | PRN
  Administered 2018-04-23 (×4): 650 mg via ORAL
  Filled 2018-04-23 (×4): qty 2

## 2018-04-23 MED ORDER — CLONIDINE HCL 0.2 MG PO TABS
0.3000 mg | ORAL_TABLET | Freq: Two times a day (BID) | ORAL | Status: DC
  Administered 2018-04-23 – 2018-04-24 (×3): 0.3 mg via ORAL
  Filled 2018-04-23 (×4): qty 1

## 2018-04-23 MED ORDER — METFORMIN HCL 500 MG PO TABS
500.0000 mg | ORAL_TABLET | Freq: Two times a day (BID) | ORAL | Status: DC
  Administered 2018-04-23 – 2018-04-24 (×4): 500 mg via ORAL
  Filled 2018-04-23 (×4): qty 1

## 2018-04-23 NOTE — ED Notes (Signed)
Patient on the phone with sister at this time. Asking for breakfast at this time.
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
## 2018-04-23 NOTE — Progress Notes (Signed)
Pt. meets criteria for inpatient treatment per Lindon Romp, NP.  Referred out to the following hospitals: [facility]  [facility]       Disposition CSW will continue to follow for placement.  Areatha Keas. Judi Cong, MSW, [place] Disposition Clinical Social Work [phone] (cell) [phone] (office)

## 2018-04-23 NOTE — ED Notes (Signed)
Pt given lunch tray.

## 2018-04-23 NOTE — ED Notes (Signed)
Pt given breakfast tray

## 2018-04-23 NOTE — ED Notes (Signed)
Report given to brandy rn

## 2018-04-23 NOTE — Progress Notes (Signed)
Per Lindon Romp, NP pt meets criteria for inpt treatment. TTS to seek placement. EDP Francine Graven, DO and pt's nurse Nira Conn, RN have been advised of the disposition.  Lind Covert, MSW, LCSW Therapeutic Triage Specialist  [phone]'

## 2018-04-23 NOTE — ED Notes (Signed)
TTS consult completed. Patient meets criteria for inpatient. Thurnell Garbe MD notified.

## 2018-04-23 NOTE — ED Notes (Addendum)
Pt c/o light headedness and seeing white spots, EDP [place] notified

## 2018-04-23 NOTE — Progress Notes (Signed)
Re-Assessment: TTS counselor spoke with patient this morning. Patient's mood was depressed and her affect was congruent. Patient reported that she felt "lost and alone." Patient continues to endorse SI and AVH. She reviewed that her symptoms have worsened since she no longer attends her group at [facility]. Patient stated she did not sleep the previous night. She was alert and oriented x 4. Patient continues to meet in patient criteria based on SI AVH.

## 2018-04-24 ENCOUNTER — Inpatient Hospital Stay (HOSPITAL_COMMUNITY)
Admission: AD | Admit: 2018-04-24 | Discharge: 2018-05-01 | DRG: 885 | Disposition: A | Discharge status: Home / Self Care | Payer: Medicaid Other | Source: Intra-hospital | Attending: Psychiatry | Admitting: Psychiatry

## 2018-04-24 ENCOUNTER — Encounter (HOSPITAL_COMMUNITY): Payer: Self-pay

## 2018-04-24 DIAGNOSIS — F411 Generalized anxiety disorder: Secondary | ICD-10-CM | POA: Diagnosis present

## 2018-04-24 DIAGNOSIS — J449 Chronic obstructive pulmonary disease, unspecified: Secondary | ICD-10-CM | POA: Diagnosis present

## 2018-04-24 DIAGNOSIS — Z90710 Acquired absence of both cervix and uterus: Secondary | ICD-10-CM | POA: Diagnosis not present

## 2018-04-24 DIAGNOSIS — Z833 Family history of diabetes mellitus: Secondary | ICD-10-CM | POA: Diagnosis not present

## 2018-04-24 DIAGNOSIS — G8929 Other chronic pain: Secondary | ICD-10-CM | POA: Diagnosis present

## 2018-04-24 DIAGNOSIS — F209 Schizophrenia, unspecified: Secondary | ICD-10-CM | POA: Diagnosis present

## 2018-04-24 DIAGNOSIS — F4310 Post-traumatic stress disorder, unspecified: Secondary | ICD-10-CM | POA: Diagnosis present

## 2018-04-24 DIAGNOSIS — F1410 Cocaine abuse, uncomplicated: Secondary | ICD-10-CM | POA: Diagnosis present

## 2018-04-24 DIAGNOSIS — F419 Anxiety disorder, unspecified: Secondary | ICD-10-CM | POA: Diagnosis not present

## 2018-04-24 DIAGNOSIS — F1210 Cannabis abuse, uncomplicated: Secondary | ICD-10-CM | POA: Diagnosis present

## 2018-04-24 DIAGNOSIS — Z87891 Personal history of nicotine dependence: Secondary | ICD-10-CM

## 2018-04-24 DIAGNOSIS — G4700 Insomnia, unspecified: Secondary | ICD-10-CM | POA: Diagnosis present

## 2018-04-24 DIAGNOSIS — F200 Paranoid schizophrenia: Secondary | ICD-10-CM | POA: Diagnosis not present

## 2018-04-24 DIAGNOSIS — E785 Hyperlipidemia, unspecified: Secondary | ICD-10-CM | POA: Diagnosis present

## 2018-04-24 DIAGNOSIS — I10 Essential (primary) hypertension: Secondary | ICD-10-CM | POA: Diagnosis present

## 2018-04-24 DIAGNOSIS — Z79899 Other long term (current) drug therapy: Secondary | ICD-10-CM

## 2018-04-24 DIAGNOSIS — G4733 Obstructive sleep apnea (adult) (pediatric): Secondary | ICD-10-CM | POA: Diagnosis present

## 2018-04-24 DIAGNOSIS — E119 Type 2 diabetes mellitus without complications: Secondary | ICD-10-CM | POA: Diagnosis present

## 2018-04-24 DIAGNOSIS — Z7984 Long term (current) use of oral hypoglycemic drugs: Secondary | ICD-10-CM

## 2018-04-24 DIAGNOSIS — R45851 Suicidal ideations: Secondary | ICD-10-CM | POA: Diagnosis present

## 2018-04-24 DIAGNOSIS — K219 Gastro-esophageal reflux disease without esophagitis: Secondary | ICD-10-CM | POA: Diagnosis present

## 2018-04-24 DIAGNOSIS — Z91410 Personal history of adult physical and sexual abuse: Secondary | ICD-10-CM | POA: Diagnosis not present

## 2018-04-24 DIAGNOSIS — R451 Restlessness and agitation: Secondary | ICD-10-CM | POA: Diagnosis not present

## 2018-04-24 DIAGNOSIS — F1310 Sedative, hypnotic or anxiolytic abuse, uncomplicated: Secondary | ICD-10-CM | POA: Diagnosis present

## 2018-04-24 DIAGNOSIS — Z23 Encounter for immunization: Secondary | ICD-10-CM | POA: Diagnosis not present

## 2018-04-24 DIAGNOSIS — K5900 Constipation, unspecified: Secondary | ICD-10-CM | POA: Diagnosis present

## 2018-04-24 LAB — CBG MONITORING, ED
GLUCOSE-CAPILLARY: 155 mg/dL — AB (ref 70–99)
Glucose-Capillary: 123 mg/dL — ABNORMAL HIGH (ref 70–99)
Glucose-Capillary: 139 mg/dL — ABNORMAL HIGH (ref 70–99)

## 2018-04-24 LAB — GLUCOSE, CAPILLARY: Glucose-Capillary: 107 mg/dL — ABNORMAL HIGH (ref 70–99)

## 2018-04-24 MED ORDER — MONTELUKAST SODIUM 10 MG PO TABS
10.0000 mg | ORAL_TABLET | Freq: Every day | ORAL | Status: DC
  Filled 2018-04-24: qty 1

## 2018-04-24 MED ORDER — ALUM & MAG HYDROXIDE-SIMETH 200-200-20 MG/5ML PO SUSP: 30.0000 mL | ORAL | Status: DC | PRN

## 2018-04-24 MED ORDER — PANTOPRAZOLE SODIUM 40 MG PO TBEC
80.0000 mg | DELAYED_RELEASE_TABLET | Freq: Every day | ORAL | Status: DC
  Administered 2018-04-25 – 2018-05-01 (×7): 80 mg via ORAL
  Filled 2018-04-24 (×8): qty 2

## 2018-04-24 MED ORDER — GABAPENTIN 400 MG PO CAPS
400.0000 mg | ORAL_CAPSULE | Freq: Three times a day (TID) | ORAL | Status: DC
  Administered 2018-04-25 – 2018-05-01 (×19): 400 mg via ORAL
  Filled 2018-04-24 (×23): qty 1

## 2018-04-24 MED ORDER — TRAZODONE HCL 100 MG PO TABS
200.0000 mg | ORAL_TABLET | Freq: Every day | ORAL | Status: DC
  Administered 2018-04-24: 200 mg via ORAL
  Filled 2018-04-24 (×4): qty 2

## 2018-04-24 MED ORDER — DIPHENHYDRAMINE HCL 25 MG PO CAPS
25.0000 mg | ORAL_CAPSULE | Freq: Once | ORAL | Status: AC
  Administered 2018-04-24: 25 mg via ORAL
  Filled 2018-04-24: qty 1

## 2018-04-24 MED ORDER — METFORMIN HCL 500 MG PO TABS
500.0000 mg | ORAL_TABLET | Freq: Two times a day (BID) | ORAL | Status: DC
  Administered 2018-04-25 – 2018-05-01 (×13): 500 mg via ORAL
  Filled 2018-04-24 (×15): qty 1

## 2018-04-24 MED ORDER — MAGNESIUM HYDROXIDE 400 MG/5ML PO SUSP
30.0000 mL | Freq: Every day | ORAL | Status: DC | PRN
  Administered 2018-04-25 – 2018-04-28 (×2): 30 mL via ORAL
  Filled 2018-04-24 (×2): qty 30

## 2018-04-24 MED ORDER — HYDROXYZINE HCL 25 MG PO TABS
25.0000 mg | ORAL_TABLET | Freq: Three times a day (TID) | ORAL | Status: DC | PRN
  Administered 2018-04-26 (×2): 25 mg via ORAL
  Filled 2018-04-24 (×2): qty 1

## 2018-04-24 MED ORDER — METOPROLOL TARTRATE 50 MG PO TABS
100.0000 mg | ORAL_TABLET | Freq: Two times a day (BID) | ORAL | Status: DC
  Administered 2018-04-25: 100 mg via ORAL
  Filled 2018-04-24 (×5): qty 1

## 2018-04-24 MED ORDER — AMLODIPINE BESYLATE 5 MG PO TABS
5.0000 mg | ORAL_TABLET | Freq: Every day | ORAL | Status: DC
  Administered 2018-04-25 – 2018-05-01 (×7): 5 mg via ORAL
  Filled 2018-04-24 (×8): qty 1

## 2018-04-24 MED ORDER — ATORVASTATIN CALCIUM 40 MG PO TABS
40.0000 mg | ORAL_TABLET | Freq: Every day | ORAL | Status: DC
  Administered 2018-04-25 – 2018-04-30 (×6): 40 mg via ORAL
  Filled 2018-04-24 (×7): qty 1

## 2018-04-24 MED ORDER — QUETIAPINE FUMARATE 400 MG PO TABS
800.0000 mg | ORAL_TABLET | Freq: Every day | ORAL | Status: DC
  Administered 2018-04-24 – 2018-04-26 (×3): 800 mg via ORAL
  Filled 2018-04-24 (×3): qty 2
  Filled 2018-04-24: qty 4
  Filled 2018-04-24: qty 2

## 2018-04-24 MED ORDER — PANTOPRAZOLE SODIUM 40 MG PO TBEC
DELAYED_RELEASE_TABLET | ORAL | Status: AC
  Filled 2018-04-24: qty 1

## 2018-04-24 MED ORDER — CLONIDINE HCL 0.3 MG PO TABS
0.3000 mg | ORAL_TABLET | Freq: Two times a day (BID) | ORAL | Status: DC
  Filled 2018-04-24: qty 1

## 2018-04-24 MED ORDER — CLONIDINE HCL 0.3 MG PO TABS
0.3000 mg | ORAL_TABLET | Freq: Two times a day (BID) | ORAL | Status: DC
  Administered 2018-04-24 – 2018-05-01 (×14): 0.3 mg via ORAL
  Filled 2018-04-24 (×8): qty 1
  Filled 2018-04-24: qty 3
  Filled 2018-04-24 (×8): qty 1

## 2018-04-24 MED ORDER — ACETAMINOPHEN 325 MG PO TABS
650.0000 mg | ORAL_TABLET | Freq: Four times a day (QID) | ORAL | Status: DC | PRN
  Administered 2018-04-25 – 2018-04-26 (×2): 650 mg via ORAL
  Filled 2018-04-24 (×2): qty 2

## 2018-04-24 NOTE — ED Notes (Signed)
Awaiting [place] placement Continued hold

## 2018-04-24 NOTE — ED Notes (Signed)
Called [facility] to transfer to [facility].

## 2018-04-24 NOTE — ED Notes (Signed)
Quiet, cooperative and waiting placement

## 2018-04-24 NOTE — ED Notes (Signed)
Pt ambulatory to restroom

## 2018-04-24 NOTE — ED Notes (Signed)
Report to [facility] adult unit [place]

## 2018-04-24 NOTE — ED Notes (Signed)
Out of bed to [facility] 

## 2018-04-24 NOTE — ED Notes (Signed)
Report received 

## 2018-04-24 NOTE — ED Notes (Signed)
Pt. Was given a second breakfast tray

## 2018-04-24 NOTE — BH Assessment (Signed)
[name] Assessment Progress Note     Patient was seen by TTS for re-assessment.  Patient states that she continues to see shadows and states that she does not feel safe to leave the hospital  She appeared to be thought blocking or responding to internal stimuli.  Patient states that she has been going through a lot lately and states that she feels like she could benefit from hospitalization because she states that she lives a lone and has been isolating and compounding her problems.  Due to patient not being able to contract for safety, TTS will continue to seek placement.

## 2018-04-24 NOTE — ED Notes (Signed)
Pt reports itching after eating tomatoes and mayonaise- denies allery, but states she often itches  Order for benadryl   Reported to [facility] RN

## 2018-04-24 NOTE — ED Notes (Signed)
Has eaten lunch, spoken to friend on phone   Awaiting placement

## 2018-04-24 NOTE — Progress Notes (Addendum)
Pt accepted to [facility], Bed 501-2 Marvia Pickles, NP is the accepting provider.  Maris Berger, MD is the attending provider.  Call report to [phone]  [email or web address]  ED notified.   Pt is Voluntary.  Pt may be transported by [facility]  Pt scheduled  to arrive at [facility] @20 :00  Romie Minus T. Judi Cong, MSW, [place] Disposition Clinical Social Work [phone] (cell) [phone] (office)

## 2018-04-24 NOTE — ED Notes (Addendum)
Call from [place] accepted to [facility] rm [place], NP accepting for Dr Nancy Fetter  Pt will not be accepted to facility until 2000

## 2018-04-25 ENCOUNTER — Other Ambulatory Visit: Payer: Self-pay

## 2018-04-25 DIAGNOSIS — F419 Anxiety disorder, unspecified: Secondary | ICD-10-CM

## 2018-04-25 DIAGNOSIS — F200 Paranoid schizophrenia: Secondary | ICD-10-CM

## 2018-04-25 DIAGNOSIS — G4700 Insomnia, unspecified: Secondary | ICD-10-CM

## 2018-04-25 LAB — GLUCOSE, CAPILLARY
Glucose-Capillary: 110 mg/dL — ABNORMAL HIGH (ref 70–99)
Glucose-Capillary: 201 mg/dL — ABNORMAL HIGH (ref 70–99)

## 2018-04-25 LAB — CBG MONITORING, ED: Glucose-Capillary: 125 mg/dL — ABNORMAL HIGH (ref 70–99)

## 2018-04-25 MED ORDER — IBUPROFEN 400 MG PO TABS
400.0000 mg | ORAL_TABLET | Freq: Three times a day (TID) | ORAL | Status: DC | PRN
  Administered 2018-04-26 – 2018-05-01 (×6): 400 mg via ORAL
  Filled 2018-04-25 (×7): qty 1

## 2018-04-25 MED ORDER — MONTELUKAST SODIUM 10 MG PO TABS: 10.0000 mg | ORAL_TABLET | ORAL | Status: DC

## 2018-04-25 MED ORDER — INFLUENZA VAC SPLIT QUAD 0.5 ML IM SUSY
0.5000 mL | PREFILLED_SYRINGE | INTRAMUSCULAR | Status: AC
  Administered 2018-04-26: 0.5 mL via INTRAMUSCULAR
  Filled 2018-04-25: qty 0.5

## 2018-04-25 MED ORDER — MONTELUKAST SODIUM 10 MG PO TABS
10.0000 mg | ORAL_TABLET | ORAL | Status: DC
  Administered 2018-04-25 – 2018-05-01 (×7): 10 mg via ORAL
  Filled 2018-04-25 (×8): qty 1

## 2018-04-25 NOTE — Plan of Care (Signed)
  Problem: Coping: Goal: Coping ability will improve Outcome: Progressing Goal: Will verbalize feelings Outcome: Progressing   

## 2018-04-25 NOTE — Progress Notes (Signed)
D. Pt presents with a depressed affect congruent with mood. Pt remained in bed sleeping for most of the morning- did not attend groups. Per pt's self inventory, pt rates her depression, hopelessness and anxiety all 7's.. Pt currently denies SI/HI and AVH and agrees to contact staff before acting on any harmful thoughts.  A. Labs and vitals monitored. Pt compliant with medications. Pt supported emotionally and encouraged to express concerns and ask questions.   R. Pt remains safe with 15 minute checks. Will continue POC.

## 2018-04-25 NOTE — Progress Notes (Signed)
Patient ID: Patricia [name], female   DOB: 09/03/1970, 47 y.o.   MRN: [id number] Admission Note   Pt is a 47 y/o female admitted onto the [facility] I/[facility] adult unit on a voluntary basis. On admission, Pt appear to be anxious however, cooperative. "I just feel like hurting someone." Pt acknowledged that she has been feeling increasingly depressed and sad for past few and became suicidal 2 days ago; "I was right there, I just had to go to the ED." Pt at this time endorsed moderate generalized body pain, anxiety, depression and worrying. Pt denied SI/HI or AVH at this time. Pt also denied recent alcohol or cigarette use. Pt skin assessment showed old scar on R. forearm. Pt's UDS was +ve for cocaine and THC. Support, encouragement, and safe environment provided.  15-minute safety checks initiated and continued. Pt remained cooperative through the admission process. Pt remained alert and oriented through the admission process. Pt searched and no contrabands found.

## 2018-04-25 NOTE — [facility] Group Notes (Signed)
LCSW Group Therapy Note  04/25/2018   11:00am-12:00   Type of Therapy and Topic:  Group Therapy: Anger Cues and Responses  Participation Level:  Did Not Attend   Description of Group:   In this group, patients learned how to recognize the physical, cognitive, emotional, and behavioral responses they have to anger-provoking situations.  They identified a recent time they became angry and how they reacted.  They analyzed how their reaction was possibly beneficial and how it was possibly unhelpful.  The group discussed a variety of healthier coping skills that could help with such a situation in the future.  Deep breathing was practiced briefly.  Therapeutic Goals: 1. Patients will remember their last incident of anger and how they felt emotionally and physically, what their thoughts were at the time, and how they behaved. 2. Patients will identify how their behavior at that time worked for them, as well as how it worked against them. 3. Patients will explore possible new behaviors to use in future anger situations. 4. Patients will learn that anger itself is normal and cannot be eliminated, and that healthier reactions can assist with resolving conflict rather than worsening situations.  Summary of Patient Progress:   Did not attend  Therapeutic Modalities:   Cognitive Behavioral Therapy  Rolanda Jay
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
## 2018-04-25 NOTE — BHH Group Notes (Signed)
[place] Group Notes:  (Nursing)  Date:  04/25/2018  Time: 9:30 AM Type of Therapy:  Nurse Education  Participation Level:  Did Not Attend   [name] 04/25/2018, 12:30 PM

## 2018-04-25 NOTE — Progress Notes (Signed)
Adult Psychoeducational Group Note  Date:  04/25/2018 Time:  9:25 PM  Group Topic/Focus:  Wrap-Up Group:   The focus of this group is to help patients review their daily goal of treatment and discuss progress on daily workbooks.  Participation Level:  Active  Participation Quality:  Appropriate  Affect:  Appropriate  Cognitive:  Appropriate  Insight: Appropriate  Engagement in Group:  Engaged  Modes of Intervention:  Discussion  Additional Comments:  The patient  expressed that she rates today a 7  Patricia [name] 04/25/2018, 9:25 PM

## 2018-04-25 NOTE — Tx Team (Signed)
Initial Treatment Plan 04/25/2018 12:14 AM Patricia [name] ZUD:[id number]    PATIENT STRESSORS: Financial difficulties Health problems Occupational concerns Substance abuse   PATIENT STRENGTHS: Capable of independent living Communication skills Supportive family/friends   PATIENT IDENTIFIED PROBLEMS: "I feel like no one cares about me"  "I just want to hurt someone"  Depression  Anxiety  Risk for suicide             DISCHARGE CRITERIA:  Ability to meet basic life and health needs Verbal commitment to aftercare and medication compliance  PRELIMINARY DISCHARGE PLAN: Outpatient therapy Return to previous living arrangement  PATIENT/FAMILY INVOLVEMENT: This treatment plan has been presented to and reviewed with the patient, Patricia [name], and/or family member.  The patient and family have been given the opportunity to ask questions and make suggestions.  Leia Alf, RN 04/25/2018, 12:14 AM

## 2018-04-25 NOTE — Consult Note (Signed)
Consulted by psychiatry for management of medications and discussion of QTC.  In brief 47 year old woman with past medical history relevant for polysubstance abuse, hypertension, chronic pain, type 2 diabetes on oral hypoglycemics, hyperlipidemia admitted for inpatient psychiatry evaluation for schizophrenia. Consulted by psychiatry about discussion of antihypertensive medications as patient's UDS was positive for cocaine and additionally patient is a history of prolonged QTC.  Recommendations: - Continue clonidine 0.3 mg twice daily -Continue amlodipine 5 mg daily -Discontinue metoprolol tartrate 100 mg twice daily in the setting of cocaine use - QTC on today's EKG is 470, within range of normal for woman

## 2018-04-25 NOTE — BHH Group Notes (Signed)
[facility] Group Notes:  (Nursing/Relaxation Therapy  Date:  04/25/2018  Time: 1600 Type of Therapy:  Nurse Education  Participation Level:  Active  Participation Quality:  Attentive, Sharing and Supportive  Affect:  Appropriate  Cognitive:  Alert, Appropriate and Oriented  Insight:  Appropriate  Engagement in Group:  Engaged and Supportive  Modes of Intervention:  Discussion, Education and Support  Summary of Progress/Problems: Pt was engaged in activity. [employer] 04/25/2018, 1600
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
## 2018-04-26 DIAGNOSIS — F4310 Post-traumatic stress disorder, unspecified: Secondary | ICD-10-CM

## 2018-04-26 DIAGNOSIS — F419 Anxiety disorder, unspecified: Secondary | ICD-10-CM

## 2018-04-26 DIAGNOSIS — G4700 Insomnia, unspecified: Secondary | ICD-10-CM

## 2018-04-26 DIAGNOSIS — F209 Schizophrenia, unspecified: Principal | ICD-10-CM

## 2018-04-26 DIAGNOSIS — Z87891 Personal history of nicotine dependence: Secondary | ICD-10-CM

## 2018-04-26 LAB — LIPID PANEL
CHOL/HDL RATIO: 2.8 ratio
CHOLESTEROL: 162 mg/dL (ref 0–200)
HDL: 57 mg/dL (ref >40)
LDL Cholesterol: 84 mg/dL (ref 0–99)
Triglycerides: 103 mg/dL (ref <150)
VLDL: 21 mg/dL (ref 0–40)

## 2018-04-26 LAB — GLUCOSE, CAPILLARY
GLUCOSE-CAPILLARY: 205 mg/dL — AB (ref 70–99)
GLUCOSE-CAPILLARY: 158 mg/dL — AB (ref 70–99)

## 2018-04-26 LAB — HEMOGLOBIN A1C
HEMOGLOBIN A1C: 7.1 % — AB (ref 4.8–5.6)
MEAN PLASMA GLUCOSE: 157.07 mg/dL

## 2018-04-26 MED ORDER — PRAZOSIN HCL 1 MG PO CAPS
1.0000 mg | ORAL_CAPSULE | Freq: Every day | ORAL | Status: DC
  Administered 2018-04-26 – 2018-04-30 (×5): 1 mg via ORAL
  Filled 2018-04-26 (×6): qty 1

## 2018-04-26 MED ORDER — TRAZODONE HCL 50 MG PO TABS
50.0000 mg | ORAL_TABLET | Freq: Every evening | ORAL | Status: DC | PRN
  Administered 2018-04-26 – 2018-04-30 (×7): 50 mg via ORAL
  Filled 2018-04-26 (×13): qty 1

## 2018-04-26 MED ORDER — HYDROXYZINE HCL 50 MG PO TABS
50.0000 mg | ORAL_TABLET | Freq: Three times a day (TID) | ORAL | Status: DC | PRN
  Administered 2018-04-26 – 2018-04-30 (×4): 50 mg via ORAL
  Filled 2018-04-26 (×4): qty 1
  Filled 2018-04-26: qty 10
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
## 2018-04-26 NOTE — Progress Notes (Signed)
Nutrition Brief Note  Patient identified on the Malnutrition Screening Tool (MST) Report  Pt documented as consuming 100% of meals in [facility] ED. Appetite seems to have improved. Supplements not warranted at this time.  Wt Readings from Last 15 Encounters:  04/24/18 118.8 kg  04/22/18 122.9 kg  02/11/18 131.1 kg  11/06/17 130.9 kg  09/30/17 135.6 kg  09/13/17 136 kg  09/08/17 135.4 kg  08/21/17 133.8 kg  08/04/17 135.2 kg  07/26/17 133.8 kg  06/25/17 133.1 kg  06/17/17 135.2 kg  06/16/17 135.5 kg  06/11/17 128.3 kg  05/21/17 123.4 kg    Body mass index is 44.97 kg/m. Patient meets criteria for morbid obesity based on current BMI.  Labs and medications reviewed.   No nutrition interventions warranted at this time. If nutrition issues arise, please consult RD.   Clayton Bibles, MS, RD, [place] Dietitian Pager: [phone] After Hours Pager: [phone]

## 2018-04-26 NOTE — Progress Notes (Signed)
After attending group led by SW, pt presented to staff extremely anxious, tearful, clenching fists, stating, "I don't want to feel this way,  I want to hurt someone, I want to see blood". Sat down with pt, reassured her that she was safe and that we weren't going to let anything happen to her or anyone else.  Shortly after, pt calmed down after being seen by NP and receiving ordered prn med.

## 2018-04-26 NOTE — Progress Notes (Signed)
Adult Psychoeducational Group Note  Date:  04/26/2018 Time:  9:44 PM  Group Topic/Focus:  Wrap-Up Group:   The focus of this group is to help patients review their daily goal of treatment and discuss progress on daily workbooks.  Participation Level:  Active  Participation Quality:  Appropriate  Affect:  Appropriate  Cognitive:  Appropriate  Insight: Appropriate  Engagement in Group:  Engaged  Modes of Intervention:  Discussion  Additional Comments:  Pt stated her goal for today was to talk with her professional about her treatment plan. Pt stated she was able to accomplished her goal today.  Pt rated her over all day a 8 out of 10. Pt stated she attend all groups held today. Pt stated interacting with her peers and opening up more at the group session help improve her day.  Candy Sledge 04/26/2018, 9:44 PM
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
## 2018-04-26 NOTE — Progress Notes (Signed)
Patient ID: Patricia [name], female   DOB: [birth year], 47 y.o.   MRN: [id number] D: Pt observed in dayroom interacting with peers. Pt at the time of assessment endorsed moderate and depression; "I feel much better because I have people to talk to." Pt denied SI, HI, AVH or pain.  Pt was calm and cooperative.  A: Medications offered as prescribed. All patient's questions and concerns addressed. Support, encouragement, and safe environment provided. Will continue to monitor for any changes. 15-minute safety checks continue. R: Pt was med compliant. Pt attended wrap-up group. Safety checks continue.

## 2018-04-26 NOTE — Progress Notes (Signed)
Patient's blood pressure rechecked manually 145/100- pt reports that she doesn't like to drink water- reports that she has just been drinking coffee. Pt brought a pitcher of water and encouraged to drink.
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
## 2018-04-27 DIAGNOSIS — I10 Essential (primary) hypertension: Secondary | ICD-10-CM

## 2018-04-27 DIAGNOSIS — J449 Chronic obstructive pulmonary disease, unspecified: Secondary | ICD-10-CM

## 2018-04-27 DIAGNOSIS — E785 Hyperlipidemia, unspecified: Secondary | ICD-10-CM

## 2018-04-27 DIAGNOSIS — K219 Gastro-esophageal reflux disease without esophagitis: Secondary | ICD-10-CM

## 2018-04-27 DIAGNOSIS — E119 Type 2 diabetes mellitus without complications: Secondary | ICD-10-CM

## 2018-04-27 LAB — GLUCOSE, CAPILLARY
Glucose-Capillary: 142 mg/dL — ABNORMAL HIGH (ref 70–99)
Glucose-Capillary: 174 mg/dL — ABNORMAL HIGH (ref 70–99)
Glucose-Capillary: 294 mg/dL — ABNORMAL HIGH (ref 70–99)

## 2018-04-27 MED ORDER — QUETIAPINE FUMARATE 300 MG PO TABS
600.0000 mg | ORAL_TABLET | Freq: Every day | ORAL | Status: DC
  Administered 2018-04-27: 600 mg via ORAL
  Filled 2018-04-27 (×3): qty 2

## 2018-04-27 MED ORDER — HALOPERIDOL 5 MG PO TABS
5.0000 mg | ORAL_TABLET | Freq: Two times a day (BID) | ORAL | Status: DC
  Administered 2018-04-27 – 2018-04-28 (×3): 5 mg via ORAL
  Filled 2018-04-27 (×8): qty 1
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
## 2018-04-27 NOTE — Progress Notes (Signed)
Patient ID: Patricia [name], female   DOB: 1971-02-01, 47 y.o.   MRN: [id number] D: Pt observed in dayroom interacting with peers. Pt at the time of assessment endorsed moderate anxiety and depression; "I had a bad moment this afternoon but I'm doing much better now." Pt denied SI, HI, AVH or pain.  Pt remained calm and cooperative.  A: Medications offered as prescribed. All patient's questions and concerns addressed. Support, encouragement, and safe environment provided. Will continue to monitor for any changes. 15-minute safety checks continue. R: Pt was med compliant. Pt attended wrap-up group. Safety checks continue.
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
## 2018-04-27 NOTE — Tx Team (Signed)
Interdisciplinary Treatment and Diagnostic Plan Update  04/27/2018 Time of Session: 9:02 AM  ANJEANETTE PETZOLD MRN: [id number]  Principal Diagnosis: Schizophrenia [facility])  Secondary Diagnoses: Principal Problem:   Schizophrenia ([place])   Current Medications:  Current Facility-Administered Medications  Medication Dose Route Frequency Provider Last Rate Last Dose  . acetaminophen (TYLENOL) tablet 650 mg  650 mg Oral Q6H PRN Money, Lowry Ram, FNP   650 mg at 04/26/18 1509  . alum & mag hydroxide-simeth (MAALOX/MYLANTA) 200-200-20 MG/5ML suspension 30 mL  30 mL Oral Q4H PRN Money, Darnelle Maffucci B, FNP      . amLODipine (NORVASC) tablet 5 mg  5 mg Oral Daily Money, Lowry Ram, FNP   5 mg at 04/27/18 0744  . atorvastatin (LIPITOR) tablet 40 mg  40 mg Oral q1800 Money, Darnelle Maffucci B, FNP   40 mg at 04/26/18 1708  . cloNIDine (CATAPRES) tablet 0.3 mg  0.3 mg Oral BID Laverle Hobby, PA-C   0.3 mg at 04/27/18 0744  . gabapentin (NEURONTIN) capsule 400 mg  400 mg Oral TID Cobos, Myer Peer, MD   400 mg at 04/27/18 0745  . hydrOXYzine (ATARAX/VISTARIL) tablet 50 mg  50 mg Oral TID PRN Derrill Center, NP   50 mg at 04/26/18 2205  . ibuprofen (ADVIL,MOTRIN) tablet 400 mg  400 mg Oral Q8H PRN Cobos, Myer Peer, MD   400 mg at 04/27/18 0745  . magnesium hydroxide (MILK OF MAGNESIA) suspension 30 mL  30 mL Oral Daily PRN Money, Darnelle Maffucci B, FNP   30 mL at 04/25/18 1348  . metFORMIN (GLUCOPHAGE) tablet 500 mg  500 mg Oral BID WC Money, Darnelle Maffucci B, FNP   500 mg at 04/27/18 0745  . montelukast (SINGULAIR) tablet 10 mg  10 mg Oral BH-q7a Simon, Spencer E, PA-C   10 mg at 04/27/18 0745  . pantoprazole (PROTONIX) EC tablet 80 mg  80 mg Oral Daily Money, Lowry Ram, FNP   80 mg at 04/27/18 0744  . prazosin (MINIPRESS) capsule 1 mg  1 mg Oral QHS Derrill Center, NP   1 mg at 04/26/18 2205  . QUEtiapine (SEROQUEL) tablet 800 mg  800 mg Oral QHS Cobos, Myer Peer, MD   800 mg at 04/26/18 2205  . traZODone (DESYREL) tablet 50 mg  50 mg  Oral QHS,MR X 1 Derrill Center, NP   50 mg at 04/26/18 2205    PTA Medications: Medications Prior to Admission  Medication Sig Dispense Refill Last Dose  . amLODipine (NORVASC) 5 MG tablet TAKE 1 Tablet BY MOUTH ONCE DAILY (Patient taking differently: Take 5 mg by mouth every morning. ) 90 tablet 0 04/22/2018 at Unknown time  . atorvastatin (LIPITOR) 40 MG tablet Take 1 tablet (40 mg total) by mouth daily. 90 tablet 1 04/21/2018 at Unknown time  . cloNIDine (CATAPRES) 0.3 MG tablet TAKE 1 Tablet  BY MOUTH TWICE DAILY (Patient taking differently: Take 0.3 mg by mouth 2 (two) times daily. ) 180 tablet 0 04/22/2018 at Unknown time  . docusate sodium (COLACE) 100 MG capsule Take 1 capsule (100 mg total) by mouth 2 (two) times daily as needed for mild constipation. (Patient not taking: Reported on 04/22/2018) 1 capsule 0 Not Taking at Unknown time  . gabapentin (NEURONTIN) 400 MG capsule Take 1 capsule (400 mg total) by mouth 3 (three) times daily. For agitation   04/22/2018 at Unknown time  . HYDROcodone-acetaminophen (NORCO/VICODIN) 5-325 MG tablet Take 1 tablet by mouth every 4 (four) hours as  needed. (Patient not taking: Reported on 04/22/2018) 10 tablet 0 Not Taking at Unknown time  . lidocaine (LIDODERM) 5 % Place 1 patch onto the skin daily. Remove & Discard patch within 12 hours or as directed by MD   Past Week at Unknown time  . metFORMIN (GLUCOPHAGE) 500 MG tablet TAKE 1 Tablet  BY MOUTH TWICE DAILY WITH MEALS (Patient taking differently: Take 500 mg by mouth 2 (two) times daily with a meal. ) 180 tablet 1 04/22/2018 at Unknown time  . methocarbamol (ROBAXIN) 500 MG tablet Take 1 tablet (500 mg total) by mouth 2 (two) times daily. (Patient not taking: Reported on 04/22/2018) 20 tablet 0 Not Taking at Unknown time  . metoprolol tartrate (LOPRESSOR) 100 MG tablet TAKE 1 Tablet  BY MOUTH TWICE DAILY (Patient taking differently: Take 100 mg by mouth 2 (two) times daily. ) 180 tablet 1 04/22/2018 at [place]  .  montelukast (SINGULAIR) 10 MG tablet TAKE 1 Tablet BY MOUTH ONCE DAILY (Patient taking differently: Take 10 mg by mouth every morning. ) 90 tablet 1 04/22/2018 at Unknown time  . omeprazole (PRILOSEC) 40 MG capsule TAKE 1 Capsule BY MOUTH EVERY EVENING (Patient taking differently: Take 40 mg by mouth daily. ) 90 capsule 1 04/22/2018 at Unknown time  . ondansetron (ZOFRAN ODT) 4 MG disintegrating tablet Take 1 tablet (4 mg total) by mouth every 8 (eight) hours as needed for nausea. (Patient not taking: Reported on 04/22/2018) 4 tablet 0 Not Taking at Unknown time  . potassium chloride (K-DUR) 10 MEQ tablet Take 1 tablet (10 mEq total) by mouth daily. (Patient not taking: Reported on 04/22/2018) 7 tablet 0 Not Taking at Unknown time  . PROVENTIL HFA 108 (90 Base) MCG/ACT inhaler INHALE 2 PUFFS BY MOUTH EVERY 6 HOURS AS NEEDED FOR COUGHING, WHEEZING, OR SHORTNESS OF BREATH 3 Inhaler 0 Past Week at Unknown time  . QUEtiapine (SEROQUEL) 400 MG tablet Take 2 tablets (800 mg total) by mouth at bedtime. For mood control 60 tablet 0 04/21/2018 at Unknown time  . traZODone (DESYREL) 100 MG tablet Take 200 mg by mouth at bedtime.   04/21/2018 at Unknown time    Patient Stressors: Financial difficulties Health problems Occupational concerns Substance abuse  Patient Strengths: Capable of independent living Communication skills Supportive family/friends  Treatment Modalities: Medication Management, Group therapy, Case management,  1 to 1 session with clinician, Psychoeducation, Recreational therapy.   Physician Treatment Plan for Primary Diagnosis: Schizophrenia ([place]) Long Term Goal(s): Improvement in symptoms so as ready for discharge  Short Term Goals: Ability to identify changes in lifestyle to reduce recurrence of condition will improve Ability to verbalize feelings will improve Ability to disclose and discuss suicidal ideas Ability to identify and develop effective coping behaviors will  improve Compliance with prescribed medications will improve Ability to identify triggers associated with substance abuse/mental health issues will improve  Medication Management: Evaluate patient's response, side effects, and tolerance of medication regimen.  Therapeutic Interventions: 1 to 1 sessions, Unit Group sessions and Medication administration.  Evaluation of Outcomes: Progressing  Physician Treatment Plan for Secondary Diagnosis: Principal Problem:   Schizophrenia ([place])   Long Term Goal(s): Improvement in symptoms so as ready for discharge  Short Term Goals: Ability to identify changes in lifestyle to reduce recurrence of condition will improve Ability to verbalize feelings will improve Ability to disclose and discuss suicidal ideas Ability to identify and develop effective coping behaviors will improve Compliance with prescribed medications will improve Ability to identify triggers  associated with substance abuse/mental health issues will improve  Medication Management: Evaluate patient's response, side effects, and tolerance of medication regimen.  Therapeutic Interventions: 1 to 1 sessions, Unit Group sessions and Medication administration.  Evaluation of Outcomes: Progressing   RN Treatment Plan for Primary Diagnosis: Schizophrenia ([place]) Long Term Goal(s): Knowledge of disease and therapeutic regimen to maintain health will improve  Short Term Goals: Ability to identify and develop effective coping behaviors will improve and Compliance with prescribed medications will improve  Medication Management: RN will administer medications as ordered by provider, will assess and evaluate patient's response and provide education to patient for prescribed medication. RN will report any adverse and/or side effects to prescribing provider.  Therapeutic Interventions: 1 on 1 counseling sessions, Psychoeducation, Medication administration, Evaluate responses to treatment, Monitor  vital signs and CBGs as ordered, Perform/monitor CIWA, COWS, AIMS and Fall Risk screenings as ordered, Perform wound care treatments as ordered.  Evaluation of Outcomes: Progressing   LCSW Treatment Plan for Primary Diagnosis: Schizophrenia ([place]) Long Term Goal(s): Safe transition to appropriate next level of care at discharge, Engage patient in therapeutic group addressing interpersonal concerns.  Short Term Goals: Engage patient in aftercare planning with referrals and resources  Therapeutic Interventions: Assess for all discharge needs, 1 to 1 time with Social worker, Explore available resources and support systems, Assess for adequacy in community support network, Educate family and significant other(s) on suicide prevention, Complete Psychosocial Assessment, Interpersonal group therapy.  Evaluation of Outcomes: Met  Return home, follow up [place] in Treatment: Attending groups: Yes Participating in groups: Yes Taking medication as prescribed: Yes Toleration medication: Yes, no side effects reported at this time Family/Significant other contact made: No Patient understands diagnosis: Yes AEB asking for help with voices Discussing patient identified problems/goals with staff: Yes Medical problems stabilized or resolved: Yes Denies suicidal/homicidal ideation: Yes Issues/concerns per patient self-inventory: None Other: N/A  New problem(s) identified: None identified at this time.   New Short Term/Long Term Goal(s): "I don't have any goals. I need to get more support outside of here.  I was working with Danae Chen, but she said she could only work with me for 6 months, and that was over in July."   Discharge Plan or Barriers:   Reason for Continuation of Hospitalization: Depression Hallucinations Homicidal ideation Medication stabilization Suicidal ideation   Estimated Length of Stay: 10/4  Attendees: Patient: Coila Wardell 04/27/2018  9:02 AM  Physician:  Maris Berger, MD 04/27/2018  9:02 AM  Nursing:Olivette W, RN 04/27/2018  9:02 AM  RN Care Manager: Lars Pinks, RN 04/27/2018  9:02 AM  Social Worker: Ripley Fraise 04/27/2018  9:02 AM  Recreational Therapist: Winfield Cunas 04/27/2018  9:02 AM  Other: Norberto Sorenson 04/27/2018  9:02 AM  Other:  04/27/2018  9:02 AM    Scribe for Treatment Team:  Roque Lias LCSW 04/27/2018 9:02 AM

## 2018-04-27 NOTE — Progress Notes (Signed)
Adult Psychoeducational Group Note  Date:  04/27/2018 Time:  11:42 PM  Group Topic/Focus:  Recovery Goals:   The focus of this group is to identify appropriate goals for recovery and establish a plan to achieve them.  Participation Level:  Active  Participation Quality:  Appropriate  Affect:  Appropriate  Cognitive:  Appropriate  Insight: Appropriate  Engagement in Group:  Engaged  Modes of Intervention:  Discussion  Additional Comments:  Patient attended wrap up group and participated.   Manolo Bosket W Jakeya Gherardi 09/26/3141, 11:42 PM

## 2018-04-27 NOTE — Progress Notes (Signed)
Recreation Therapy Notes  Date: 9.30.19 Time: 1000 Location: 500 Hall Dayroom  Group Topic: Triggers  Goal Area(s) Addresses:  Patient will identify triggers. Patient will identify techniques used to address triggers. Patient will identify how these techniques can help them post d/c when dealing with triggers.  Behavioral Response: Engaged  Intervention: Worksheet  Activity: Triggers.  Patients were to identify problems their triggers cause.  Patients were to also identify their biggest triggers, how they can avoid their triggers and how they can deal with their triggers head on.  Education: Communication, Discharge Planning  Education Outcome: Acknowledges understanding/In group clarification offered/Needs additional education.   Clinical Observations/Feedback: Pt expressed her biggest trigger is anger.  Pt explained her biggest triggers are "people thinking I'm dumb, when people mess with my family and when no one listens to me".  Pt acknowledged that going to jail or the hospital are the results of her triggers to anger.  Pt stated she is learning to walk away but understands it's a process.    Victorino Sparrow, LRT/CTRS         Victorino Sparrow A 04/27/2018 12:07 PM
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
## 2018-04-28 DIAGNOSIS — Z91410 Personal history of adult physical and sexual abuse: Secondary | ICD-10-CM

## 2018-04-28 LAB — GLUCOSE, CAPILLARY: Glucose-Capillary: 145 mg/dL — ABNORMAL HIGH (ref 70–99)

## 2018-04-28 MED ORDER — HALOPERIDOL 5 MG PO TABS
5.0000 mg | ORAL_TABLET | Freq: Every day | ORAL | Status: DC
  Filled 2018-04-28 (×4): qty 1

## 2018-04-28 MED ORDER — QUETIAPINE FUMARATE 400 MG PO TABS
400.0000 mg | ORAL_TABLET | Freq: Every day | ORAL | Status: DC
  Administered 2018-04-28: 400 mg via ORAL
  Filled 2018-04-28 (×3): qty 1

## 2018-04-28 MED ORDER — BISACODYL 5 MG PO TBEC
5.0000 mg | DELAYED_RELEASE_TABLET | Freq: Every day | ORAL | Status: DC | PRN
  Administered 2018-04-28: 5 mg via ORAL
  Filled 2018-04-28: qty 1

## 2018-04-28 MED ORDER — BIOTENE DRY MOUTH MT LIQD
15.0000 mL | Freq: Four times a day (QID) | OROMUCOSAL | Status: DC | PRN
  Filled 2018-04-28: qty 15

## 2018-04-28 MED ORDER — HALOPERIDOL 5 MG PO TABS
10.0000 mg | ORAL_TABLET | Freq: Every day | ORAL | Status: DC
  Administered 2018-04-28 – 2018-04-30 (×3): 10 mg via ORAL
  Filled 2018-04-28 (×4): qty 2

## 2018-04-28 MED ORDER — BENZTROPINE MESYLATE 1 MG PO TABS
1.0000 mg | ORAL_TABLET | Freq: Two times a day (BID) | ORAL | Status: DC
  Administered 2018-04-28 – 2018-05-01 (×7): 1 mg via ORAL
  Filled 2018-04-28 (×11): qty 1

## 2018-04-28 NOTE — Progress Notes (Signed)
DAR NOTE: Patient presents with anxious affect and bright mood. Pt has been observed in the unit interacting well with peers and staff. Pt complained of constipation and muscle around her neck and Dr notified. Reports good sleep, good appetite, normal energy, and good appetite. Denies pain, auditory and visual hallucinations.  Rates depression at 8, hopelessness at 8, and anxiety at 8.  Maintained on routine safety checks.  Medications given as prescribed.  Support and encouragement offered as needed.  Attended group and participated.  States goal for today is " to get into a support group."  Patient observed socializing with peers in the dayroom.  Offered no complaint.

## 2018-04-28 NOTE — Progress Notes (Signed)
Adult Psychoeducational Group Note  Date:  04/28/2018 Time:  9:25 PM  Group Topic/Focus:  Wrap-Up Group:   The focus of this group is to help patients review their daily goal of treatment and discuss progress on daily workbooks.  Participation Level:  Active  Participation Quality:  Appropriate  Affect:  Appropriate  Cognitive:  Appropriate  Insight: Appropriate  Engagement in Group:  Engaged  Modes of Intervention:  Discussion  Additional Comments:  Pt stated her goal was to talk to her her doctor about her medication issue and her Social Worker about aftercare placement. Pt stated she was able to accomplished both of her goals today. Pt rated the over all day and 8 out of 10. Pt stated she attend all groups held today. Pt stated interacting with her peers and her medication issue been improved help her day.  Candy Sledge 04/28/2018, 9:25 PM

## 2018-04-28 NOTE — Progress Notes (Signed)
Recreation Therapy Notes  Date: 10.1.19 Time: 1000 Location: 500 Hall Dayroom  Group Topic: Wellness  Goal Area(s) Addresses:  Patient will define components of whole wellness. Patient will verbalize benefit of whole wellness.  Behavioral Response: Minimal  Intervention:  Exercise, music  Activity: Exercise.  LRT introduced exercise as a component of wellness.  LRT lead the patients in a series of stretches.  Each patient would then chose an exercise to lead the group in.  The group will complete 3 rounds of exercises (more or less depending on the size of the group) before completing a cool down.    Education: Wellness, Dentist.   Education Outcome: Acknowledges education/In group clarification offered/Needs additional education.   Clinical Observations/Feedback: Pt arrived late to group.  Pt was able to lead group in two exercises.  Pt also interacted with peers.     Victorino Sparrow, LRT/CTRS      Ria Comment, Steffanie Mingle A 04/28/2018 11:02 AM
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
## 2018-04-28 NOTE — BHH Group Notes (Signed)
LCSW Group Therapy Note  04/28/2018 1:15pm  Type of Therapy/Topic:  Group Therapy:  Balance in Life  Participation Level:  Active  Description of Group:    This group will address the concept of balance and how it feels and looks when one is unbalanced. Patients will be encouraged to process areas in their lives that are out of balance and identify reasons for remaining unbalanced. Facilitators will guide patients in utilizing problem-solving interventions to address and correct the stressor making their life unbalanced. Understanding and applying boundaries will be explored and addressed for obtaining and maintaining a balanced life. Patients will be encouraged to explore ways to assertively make their unbalanced needs known to significant others in their lives, using other group members and facilitator for support and feedback.  Therapeutic Goals: 1. Patient will identify two or more emotions or situations they have that consume much of in their lives. 2. Patient will identify signs/triggers that life has become out of balance:  3. Patient will identify two ways to set boundaries in order to achieve balance in their lives:  4. Patient will demonstrate ability to communicate their needs through discussion and/or role plays  Summary of Patient Progress:  Stayed the entire time, engaged throughout.  Talked about hurt and how that is the emotion that cuases the most unbalance for her.  Able to identify coping skills of listening to music and talking to others.    Therapeutic Modalities:   Cognitive Behavioral Therapy Solution-Focused Therapy Assertiveness Training  [name] Mage, [place] 04/28/2018 2:24 PM

## 2018-04-28 NOTE — Progress Notes (Signed)
Patient ID: Patricia [name], female   DOB: September 22, 1970, 47 y.o.   MRN: [id number] D: Pt observed in dayroom interacting with peers. Pt at the time of assessment endorsed moderate depression and severe anxiety, "I feel the medications are working." Pt denied SI, HI or AVH "I feel good." Pt was calm and cooperative.  A: Medications offered as prescribed. All patient's questions and concerns addressed. Support, encouragement, and safe environment provided. Will continue to monitor for any changes. 15-minute safety checks continue. R: Pt was med compliant. Pt attended wrap-up group. Safety checks continue.
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
## 2018-04-29 DIAGNOSIS — F200 Paranoid schizophrenia: Secondary | ICD-10-CM

## 2018-04-29 LAB — GLUCOSE, CAPILLARY: Glucose-Capillary: 137 mg/dL — ABNORMAL HIGH (ref 70–99)

## 2018-04-29 MED ORDER — QUETIAPINE FUMARATE 200 MG PO TABS
200.0000 mg | ORAL_TABLET | Freq: Every day | ORAL | Status: DC
  Administered 2018-04-29: 200 mg via ORAL
  Filled 2018-04-29 (×3): qty 1

## 2018-04-29 NOTE — Progress Notes (Signed)
Recreation Therapy Notes  Date: 10.2.19 Time: 1000 Location: 500 Hall Dayroom  Group Topic: Coping Skills  Goal Area(s) Addresses:  Pt will be able to identify positive coping skills. Pt will be able to identify benefits of using coping skills post d/c.  Intervention:  Worksheet, magazines, scissors, glue sticks    Activity:  Museum/gallery conservator.  Patients were to identify at least two coping skills for diversions, social, cognitive, tension releasers and physical.  Education:Coping Skills, Discharge Planning.   Education Outcome: Acknowledges understanding/In group clarification offered/Needs additional education.   Clinical Observations/Feedback: Pt did not attend group.    Victorino Sparrow, LRT/CTRS         Victorino Sparrow A 04/29/2018 12:18 PM

## 2018-04-29 NOTE — Progress Notes (Signed)
Patient self inventory- Patient slept well last night, sleep medication was not requested. Appetite has been good, energy level normal, concentration good. Depression, hopelessness, and anxiety rated 8, 8, 8. Patient endorses dizziness and headaches. Denies SI HI AVH. Denies physical pain. Patient is compliant with medications prescribed per provider. Safety is maintained with 15 minute checks as well as environmental checks. Will continue to monitor.

## 2018-04-29 NOTE — Progress Notes (Signed)
Adult Psychoeducational Group Note  Date:  04/29/2018 Time:  9:33 PM  Group Topic/Focus:  Wrap-Up Group:   The focus of this group is to help patients review their daily goal of treatment and discuss progress on daily workbooks.  Participation Level:  Active  Participation Quality:  Appropriate  Affect:  Appropriate  Cognitive:  Appropriate  Insight: Appropriate  Engagement in Group:  Engaged  Modes of Intervention:  Socialization and Support  Additional Comments:  Patient attended and participated in group tonight. She reports that the day started out bad but ended up good. She rate her day at a 8 and she is ready to go home. She is schedule to leave on Friday.  Salley Scarlet [facility] 04/29/2018, 9:33 PM

## 2018-04-29 NOTE — BHH Group Notes (Signed)
Pt was invited but did not attend orientation/goals group facilitated by MHT Janette C.

## 2018-04-29 NOTE — BHH Group Notes (Signed)
LCSW Group Therapy Note  04/29/2018 1:15pm  Type of Therapy/Topic:  Group Therapy:  Emotion Regulation  Participation Level:  Did Not Attend   Description of Group:   The purpose of this group is to assist patients in learning to regulate negative emotions and experience positive emotions. Patients will be guided to discuss ways in which they have been vulnerable to their negative emotions. These vulnerabilities will be juxtaposed with experiences of positive emotions or situations, and patients will be challenged to use positive emotions to combat negative ones. Special emphasis will be placed on coping with negative emotions in conflict situations, and patients will process healthy conflict resolution skills.  Therapeutic Goals: 1. Patient will identify two positive emotions or experiences to reflect on in order to balance out negative emotions 2. Patient will label two or more emotions that they find the most difficult to experience 3. Patient will demonstrate positive conflict resolution skills through discussion and/or role plays  Summary of Patient Progress:       Therapeutic Modalities:   Cognitive Behavioral Therapy Feelings Identification Chewey, LCSW 04/29/2018 10:00 AM

## 2018-04-29 NOTE — Progress Notes (Signed)
D: Pt discussed her medication regimen with the Probation officer. Informed Probation officer of med changes and why they were being changed. Pt approached the writer asking for cogentin, explaining that it felt like a "knot" was in her throat. Stated, she'd had the "same feeling earlier". Stated, she was supposed to have gone home today but that the, "doctor wanted to keep her during the changing of medication to make sure everything will be alright". Pt has no other questions or concerns. .   A:  Support and encouragement was offered. 15 min checks continued for safety.  R: Pt remains safe.
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
## 2018-04-30 LAB — GLUCOSE, CAPILLARY: Glucose-Capillary: 123 mg/dL — ABNORMAL HIGH (ref 70–99)

## 2018-04-30 MED ORDER — HYDROCORTISONE 1 % EX CREA
TOPICAL_CREAM | CUTANEOUS | Status: DC | PRN
  Filled 2018-04-30: qty 28

## 2018-04-30 NOTE — BHH Group Notes (Signed)
Adult Psychoeducational Group Note  Date:  04/30/2018 Time:  8:48 PM  Group Topic/Focus:  Wrap-Up Group:   The focus of this group is to help patients review their daily goal of treatment and discuss progress on daily workbooks.  Participation Level:  Active  Participation Quality:  Appropriate and Attentive  Affect:  Appropriate  Cognitive:  Alert and Appropriate  Insight: Appropriate and Good  Engagement in Group:  Engaged  Modes of Intervention:  Discussion and Education  Additional Comments:  Pt attended and participated in wrap up group this evening. Pt had a great day, due to them being discharged tomorrow. Once pt is discharged their goal is to stop being anxious and to stop getting angry as quickly.    [name] 04/30/2018, 8:48 PM

## 2018-04-30 NOTE — Progress Notes (Signed)
  [facility] Adult Case Management Discharge Plan :  Will you be returning to the same living situation after discharge:  Yes,  home At discharge, do you have transportation home?: Yes,  [place] Do you have the ability to pay for your medications: Yes,  mental health  Release of information consent forms completed and in the chart;  Patient's signature needed at discharge.  Patient to Follow up at: Follow-up Information    [facility] Follow up on 05/05/2018.   Why:  Tuesday at 10:00 for your hospital follow up appointment Contact information: [place] [address] [phone]           Next level of care provider has access to [place] and Suicide Prevention discussed: Yes,  yes  Have you used any form of tobacco in the last 30 days? (Cigarettes, Smokeless Tobacco, Cigars, and/or Pipes): No  Has patient been referred to the Quitline?: N/A patient is not a smoker  Patient has been referred for addiction treatment: [place], LCSW 04/30/2018, 3:29 PM

## 2018-04-30 NOTE — BHH Group Notes (Signed)
Pt was invited but did not attend orientation/goals group. 

## 2018-04-30 NOTE — Progress Notes (Signed)
Patient was pleasant upon approach this morning. Patient voiced no complaints this morning, said she slept well. Patient is compliant with medications prescribed per provider.  Safety is maintained with 15 minute checks as well as environmental checks. Will continue to monitor.

## 2018-04-30 NOTE — Tx Team (Signed)
Interdisciplinary Treatment and Diagnostic Plan Update  04/30/2018 Time of Session: 3:27 PM  Patricia [name] MRN: [id number]  Principal Diagnosis: Schizophrenia [facility])  Secondary Diagnoses: Principal Problem:   Schizophrenia ([place])   Current Medications:  Current Facility-Administered Medications  Medication Dose Route Frequency Provider Last Rate Last Dose  . acetaminophen (TYLENOL) tablet 650 mg  650 mg Oral Q6H PRN Money, Lowry Ram, FNP   650 mg at 04/26/18 1509  . alum & mag hydroxide-simeth (MAALOX/MYLANTA) 200-200-20 MG/5ML suspension 30 mL  30 mL Oral Q4H PRN Money, Darnelle Maffucci B, FNP      . amLODipine (NORVASC) tablet 5 mg  5 mg Oral Daily Money, Lowry Ram, FNP   5 mg at 04/30/18 0751  . antiseptic oral rinse (BIOTENE) solution 15 mL  15 mL Mouth Rinse QID PRN Pennelope Bracken, MD      . atorvastatin (LIPITOR) tablet 40 mg  40 mg Oral q1800 Money, Lowry Ram, FNP   40 mg at 04/29/18 1820  . benztropine (COGENTIN) tablet 1 mg  1 mg Oral BID Pennelope Bracken, MD   1 mg at 04/30/18 0752  . bisacodyl (DULCOLAX) EC tablet 5 mg  5 mg Oral Daily PRN Pennelope Bracken, MD   5 mg at 04/28/18 1552  . cloNIDine (CATAPRES) tablet 0.3 mg  0.3 mg Oral BID Laverle Hobby, PA-C   0.3 mg at 04/30/18 0751  . gabapentin (NEURONTIN) capsule 400 mg  400 mg Oral TID Cobos, Myer Peer, MD   400 mg at 04/30/18 1317  . haloperidol (HALDOL) tablet 5 mg  5 mg Oral QHS Pennelope Bracken, MD       Or  . haloperidol (HALDOL) tablet 10 mg  10 mg Oral QHS Pennelope Bracken, MD   10 mg at 04/29/18 2200  . hydrOXYzine (ATARAX/VISTARIL) tablet 50 mg  50 mg Oral TID PRN Derrill Center, NP   50 mg at 04/29/18 2343  . ibuprofen (ADVIL,MOTRIN) tablet 400 mg  400 mg Oral Q8H PRN Cobos, Myer Peer, MD   400 mg at 04/29/18 2211  . magnesium hydroxide (MILK OF MAGNESIA) suspension 30 mL  30 mL Oral Daily PRN Money, Darnelle Maffucci B, FNP   30 mL at 04/28/18 1017  . metFORMIN (GLUCOPHAGE) tablet 500  mg  500 mg Oral BID WC Money, Darnelle Maffucci B, FNP   500 mg at 04/30/18 0752  . montelukast (SINGULAIR) tablet 10 mg  10 mg Oral Kennith Center E, PA-C   10 mg at 04/30/18 3300  . pantoprazole (PROTONIX) EC tablet 80 mg  80 mg Oral Daily Money, Lowry Ram, FNP   80 mg at 04/30/18 0751  . prazosin (MINIPRESS) capsule 1 mg  1 mg Oral QHS Derrill Center, NP   1 mg at 04/29/18 2200  . traZODone (DESYREL) tablet 50 mg  50 mg Oral QHS,MR X 1 Derrill Center, NP   50 mg at 04/29/18 2343    PTA Medications: Medications Prior to Admission  Medication Sig Dispense Refill Last Dose  . amLODipine (NORVASC) 5 MG tablet TAKE 1 Tablet BY MOUTH ONCE DAILY (Patient taking differently: Take 5 mg by mouth every morning. ) 90 tablet 0 04/22/2018 at Unknown time  . atorvastatin (LIPITOR) 40 MG tablet Take 1 tablet (40 mg total) by mouth daily. 90 tablet 1 04/21/2018 at Unknown time  . cloNIDine (CATAPRES) 0.3 MG tablet TAKE 1 Tablet  BY MOUTH TWICE DAILY (Patient taking differently: Take 0.3 mg by mouth  2 (two) times daily. ) 180 tablet 0 04/22/2018 at Unknown time  . docusate sodium (COLACE) 100 MG capsule Take 1 capsule (100 mg total) by mouth 2 (two) times daily as needed for mild constipation. (Patient not taking: Reported on 04/22/2018) 1 capsule 0 Not Taking at Unknown time  . gabapentin (NEURONTIN) 400 MG capsule Take 1 capsule (400 mg total) by mouth 3 (three) times daily. For agitation   04/22/2018 at Unknown time  . HYDROcodone-acetaminophen (NORCO/VICODIN) 5-325 MG tablet Take 1 tablet by mouth every 4 (four) hours as needed. (Patient not taking: Reported on 04/22/2018) 10 tablet 0 Not Taking at Unknown time  . lidocaine (LIDODERM) 5 % Place 1 patch onto the skin daily. Remove & Discard patch within 12 hours or as directed by MD   Past Week at Unknown time  . metFORMIN (GLUCOPHAGE) 500 MG tablet TAKE 1 Tablet  BY MOUTH TWICE DAILY WITH MEALS (Patient taking differently: Take 500 mg by mouth 2 (two) times daily with  a meal. ) 180 tablet 1 04/22/2018 at Unknown time  . methocarbamol (ROBAXIN) 500 MG tablet Take 1 tablet (500 mg total) by mouth 2 (two) times daily. (Patient not taking: Reported on 04/22/2018) 20 tablet 0 Not Taking at Unknown time  . metoprolol tartrate (LOPRESSOR) 100 MG tablet TAKE 1 Tablet  BY MOUTH TWICE DAILY (Patient taking differently: Take 100 mg by mouth 2 (two) times daily. ) 180 tablet 1 04/22/2018 at [place]  . montelukast (SINGULAIR) 10 MG tablet TAKE 1 Tablet BY MOUTH ONCE DAILY (Patient taking differently: Take 10 mg by mouth every morning. ) 90 tablet 1 04/22/2018 at Unknown time  . omeprazole (PRILOSEC) 40 MG capsule TAKE 1 Capsule BY MOUTH EVERY EVENING (Patient taking differently: Take 40 mg by mouth daily. ) 90 capsule 1 04/22/2018 at Unknown time  . ondansetron (ZOFRAN ODT) 4 MG disintegrating tablet Take 1 tablet (4 mg total) by mouth every 8 (eight) hours as needed for nausea. (Patient not taking: Reported on 04/22/2018) 4 tablet 0 Not Taking at Unknown time  . potassium chloride (K-DUR) 10 MEQ tablet Take 1 tablet (10 mEq total) by mouth daily. (Patient not taking: Reported on 04/22/2018) 7 tablet 0 Not Taking at Unknown time  . PROVENTIL HFA 108 (90 Base) MCG/ACT inhaler INHALE 2 PUFFS BY MOUTH EVERY 6 HOURS AS NEEDED FOR COUGHING, WHEEZING, OR SHORTNESS OF BREATH 3 Inhaler 0 Past Week at Unknown time  . QUEtiapine (SEROQUEL) 400 MG tablet Take 2 tablets (800 mg total) by mouth at bedtime. For mood control 60 tablet 0 04/21/2018 at Unknown time  . traZODone (DESYREL) 100 MG tablet Take 200 mg by mouth at bedtime.   04/21/2018 at Unknown time    Patient Stressors: Financial difficulties Health problems Occupational concerns Substance abuse  Patient Strengths: Capable of independent living Communication skills Supportive family/friends  Treatment Modalities: Medication Management, Group therapy, Case management,  1 to 1 session with clinician, Psychoeducation, Recreational  therapy.   Physician Treatment Plan for Primary Diagnosis: Schizophrenia ([place]) Long Term Goal(s): Improvement in symptoms so as ready for discharge  Short Term Goals: Ability to identify changes in lifestyle to reduce recurrence of condition will improve Ability to verbalize feelings will improve Ability to disclose and discuss suicidal ideas Ability to identify and develop effective coping behaviors will improve Compliance with prescribed medications will improve Ability to identify triggers associated with substance abuse/mental health issues will improve  Medication Management: Evaluate patient's response, side effects, and tolerance of medication  regimen.  Therapeutic Interventions: 1 to 1 sessions, Unit Group sessions and Medication administration.  Evaluation of Outcomes: Adequate for Discharge  Physician Treatment Plan for Secondary Diagnosis: Principal Problem:   Schizophrenia ([place])   Long Term Goal(s): Improvement in symptoms so as ready for discharge  Short Term Goals: Ability to identify changes in lifestyle to reduce recurrence of condition will improve Ability to verbalize feelings will improve Ability to disclose and discuss suicidal ideas Ability to identify and develop effective coping behaviors will improve Compliance with prescribed medications will improve Ability to identify triggers associated with substance abuse/mental health issues will improve  Medication Management: Evaluate patient's response, side effects, and tolerance of medication regimen.  Therapeutic Interventions: 1 to 1 sessions, Unit Group sessions and Medication administration.  Evaluation of Outcomes: Adequate for Discharge   RN Treatment Plan for Primary Diagnosis: Schizophrenia ([place]) Long Term Goal(s): Knowledge of disease and therapeutic regimen to maintain health will improve  Short Term Goals: Ability to identify and develop effective coping behaviors will improve and Compliance  with prescribed medications will improve  Medication Management: RN will administer medications as ordered by provider, will assess and evaluate patient's response and provide education to patient for prescribed medication. RN will report any adverse and/or side effects to prescribing provider.  Therapeutic Interventions: 1 on 1 counseling sessions, Psychoeducation, Medication administration, Evaluate responses to treatment, Monitor vital signs and CBGs as ordered, Perform/monitor CIWA, COWS, AIMS and Fall Risk screenings as ordered, Perform wound care treatments as ordered.  Evaluation of Outcomes: Adequate for Discharge   LCSW Treatment Plan for Primary Diagnosis: Schizophrenia [facility]) Long Term Goal(s): Safe transition to appropriate next level of care at discharge, Engage patient in therapeutic group addressing interpersonal concerns.  Short Term Goals: Engage patient in aftercare planning with referrals and resources  Therapeutic Interventions: Assess for all discharge needs, 1 to 1 time with Social worker, Explore available resources and support systems, Assess for adequacy in community support network, Educate family and significant other(s) on suicide prevention, Complete Psychosocial Assessment, Interpersonal group therapy.  Evaluation of Outcomes: Met  Return home, follow up [place] in Treatment: Attending groups: Yes Participating in groups: Yes Taking medication as prescribed: Yes Toleration medication: Yes, no side effects reported at this time Family/Significant other contact made: No Patient understands diagnosis: Yes AEB asking for help with voices Discussing patient identified problems/goals with staff: Yes Medical problems stabilized or resolved: Yes Denies suicidal/homicidal ideation: Yes Issues/concerns per patient self-inventory: None Other: N/A  New problem(s) identified: None identified at this time.   New Short Term/Long Term Goal(s): "I don't have  any goals. I need to get more support outside of here.  I was working with Danae Chen, but she said she could only work with me for 6 months, and that was over in July."   Discharge Plan or Barriers:   Reason for Continuation of Hospitalization:  Medication stabilization    Estimated Length of Stay: [place] d/c tomorrow  Attendees: Patient:  04/30/2018  3:27 PM  Physician: Maris Berger, MD 04/30/2018  3:27 PM  Nursing:Olivette W, RN 04/30/2018  3:27 PM  RN Care Manager: Lars Pinks, RN 04/30/2018  3:27 PM  Social Worker: Ripley Fraise 04/30/2018  3:27 PM  Recreational Therapist: Winfield Cunas 04/30/2018  3:27 PM  Other: Norberto Sorenson 04/30/2018  3:27 PM  Other:  04/30/2018  3:27 PM    Scribe for Treatment Team:  Roque Lias LCSW 04/30/2018 3:27 PM

## 2018-04-30 NOTE — Progress Notes (Signed)
Recreation Therapy Notes  Date: 10.3.19 Time: 1000 Location: [address] Dayroom  Group Topic: Anxiety  Goal Area(s) Addresses:  Patient will identify triggers to anxiety. Patient will identify coping skills for anxiety. Patient will identify benefits of using coping skills post d/c.  Behavioral Response: Engaged  Intervention: Worksheet  Activity: Introduction to Anxiety.  Patients were to identify what triggers their anxiety, physical symptoms they experience, the thoughts they have when anxious and coping skills they use to deal with anxiety.   Education: Acknowledges education; In group clarification offered; Needs additional education  Clinical Observations/Feedback: Pt identified her triggers to anxiety as people and nightmares.  Pt expressed her symptoms were headaches and racing heart beat.  Pt identified thoughts as wanting to hurt someone and thinking about crying.  Pt stated she copes with anxiety by counting to 10 and taking "relaxing breaths".    Victorino Sparrow, LRT/CTRS      Victorino Sparrow A 04/30/2018 12:03 PM

## 2018-04-30 NOTE — Progress Notes (Signed)
D: Pt was sitting in the dayroom interacting with her peers. Informed the writer that she "wasn't feeling well this morning". Stated, "but nobody could tell, because I don't act out". Stated she "didn't know what was wrong but feels better now". Pt has no other questions or concerns.    A:  Support and encouragement was offered. 15 min checks continued for safety.  R: Pt remains safe.
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
## 2018-04-30 NOTE — [facility] Group Notes (Signed)
[facility] LCSW Group Therapy  04/30/2018 4:05 PM  Type of Therapy:  Group Therapy  Participation Level:  Active  Summary of Progress/Problems: Kalima attended group. She described feeling anxiety. Roisin stated the steps she will take that will help her reduce anxiety. She reported how she will be accountable for several personal responsibilities after her discharge. She outlined small steps toward larger goals and the support system that will help her.  Cherie Bohaboy 04/30/2018, 4:05 PM

## 2018-05-01 DIAGNOSIS — R451 Restlessness and agitation: Secondary | ICD-10-CM

## 2018-05-01 LAB — GLUCOSE, CAPILLARY: GLUCOSE-CAPILLARY: 132 mg/dL — AB (ref 70–99)

## 2018-05-01 MED ORDER — AMLODIPINE BESYLATE 5 MG PO TABS: 5.0000 mg | ORAL_TABLET | Freq: Every day | ORAL | 0 refills | Status: DC

## 2018-05-01 MED ORDER — PRAZOSIN HCL 1 MG PO CAPS: 1.0000 mg | ORAL_CAPSULE | Freq: Every day | ORAL | 0 refills | Status: DC

## 2018-05-01 MED ORDER — CLONIDINE HCL 0.3 MG PO TABS: 0.3000 mg | ORAL_TABLET | Freq: Two times a day (BID) | ORAL | 0 refills | Status: DC

## 2018-05-01 MED ORDER — HALOPERIDOL 5 MG PO TABS: 5.0000 mg | ORAL_TABLET | Freq: Every day | ORAL | 0 refills | Status: DC

## 2018-05-01 MED ORDER — METFORMIN HCL 500 MG PO TABS: 500.0000 mg | ORAL_TABLET | Freq: Two times a day (BID) | ORAL | 0 refills | Status: DC

## 2018-05-01 MED ORDER — MONTELUKAST SODIUM 10 MG PO TABS: 10.0000 mg | ORAL_TABLET | ORAL | 0 refills | Status: DC

## 2018-05-01 MED ORDER — HALOPERIDOL 10 MG PO TABS: 10.0000 mg | ORAL_TABLET | Freq: Every day | ORAL | 0 refills | Status: DC

## 2018-05-01 MED ORDER — HYDROXYZINE HCL 50 MG PO TABS: 50.0000 mg | ORAL_TABLET | Freq: Three times a day (TID) | ORAL | 0 refills | Status: DC | PRN

## 2018-05-01 MED ORDER — ATORVASTATIN CALCIUM 40 MG PO TABS: 40.0000 mg | ORAL_TABLET | Freq: Every day | ORAL | 0 refills | Status: DC

## 2018-05-01 MED ORDER — TRAZODONE HCL 50 MG PO TABS
50.0000 mg | ORAL_TABLET | Freq: Every evening | ORAL | Status: DC | PRN
  Filled 2018-05-01: qty 7

## 2018-05-01 MED ORDER — BENZTROPINE MESYLATE 1 MG PO TABS: 1.0000 mg | ORAL_TABLET | Freq: Two times a day (BID) | ORAL | 0 refills | Status: DC

## 2018-05-01 MED ORDER — HALOPERIDOL 5 MG PO TABS
15.0000 mg | ORAL_TABLET | Freq: Every day | ORAL | Status: DC
  Filled 2018-05-01 (×2): qty 21

## 2018-05-01 MED ORDER — OMEPRAZOLE 40 MG PO CPDR: 40.0000 mg | DELAYED_RELEASE_CAPSULE | Freq: Every day | ORAL | 0 refills | Status: DC

## 2018-05-01 MED ORDER — TRAZODONE HCL 50 MG PO TABS: 50.0000 mg | ORAL_TABLET | Freq: Every evening | ORAL | 0 refills | Status: DC | PRN

## 2018-05-01 MED ORDER — GABAPENTIN 400 MG PO CAPS: 400.0000 mg | ORAL_CAPSULE | Freq: Three times a day (TID) | ORAL | 0 refills | Status: DC

## 2018-05-01 NOTE — Progress Notes (Signed)
Recreation Therapy Notes  INPATIENT RECREATION TR PLAN  Patient Details Name: Patricia [name] MRN: [id number] DOB: 1971/06/03 Today's Date: 05/01/2018  Rec Therapy Plan Is patient appropriate for Therapeutic Recreation?: Yes Treatment times per week: about 3 days Estimated Length of Stay: 5-7 days TR Treatment/Interventions: Group participation (Comment)  Discharge Criteria Pt will be discharged from therapy if:: Discharged Treatment plan/goals/alternatives discussed and agreed upon by:: Patient/family  Discharge Summary Short term goals set: See patient care plan Short term goals met: Complete Progress toward goals comments: Groups attended Which groups?: Wellness, Other (Comment)(Triggers, Anxiety, Team building) Reason goals not met: None Therapeutic equipment acquired: N/A Reason patient discharged from therapy: Discharge from hospital Pt/family agrees with progress & goals achieved: Yes Date patient discharged from therapy: 05/01/18    Victorino Sparrow, LRT/CTRS  Ria Comment, Jalynn Betzold A 05/01/2018, 10:44 AM

## 2018-05-01 NOTE — Progress Notes (Signed)
Recreation Therapy Notes  Date: 10.4.19 Time: 1000 Location: 500 Hall Dayroom  Group Topic: Communication, Team Building, Problem Solving  Goal Area(s) Addresses:  Patient will effectively work with peer towards shared goal.  Patient will identify skill used to make activity successful.  Patient will identify how skills used during activity can be used to reach post d/c goals.   Behavioral Response: Engaged  Intervention: STEM Activity   Activity: Metallurgist. In teams, patients were asked to build the tallest freestanding tower possible out of 15 pipe cleaners. Systematically resources were removed, for example patient ability to use both hands and patient ability to verbally communicate.    Education: Education officer, community, Dentist.   Education Outcome: Acknowledges education/In group clarification offered/Needs additional education.   Clinical Observations/Feedback: Pt was bright and active during group.  Pt expressed her group had to build a strong foundation.  Pt also stated they had to work together to complete the activity.  Pt stated she could use the skills from the group with her support system by talking with them and letting them know what's going on with her.  Pt also expressed that she is fearful of "not knowing what is going to happen".  Pt was encouraged to talk to her family when she leaves to make them aware of that fear she is experiencing.  Pt explained she has a good support system who have been letting her know they are there for her but she still worries about being a burden on them.     Victorino Sparrow, LRT/CTRS     Ria Comment, Shardea Cwynar A 05/01/2018 10:53 AM

## 2018-05-01 NOTE — Progress Notes (Signed)
DAR NOTE: Patient presents with calm affect and pleasant mood. Pt stated she being discharged today. Pt she is happy, and grateful for the help she was able to received. Pt stated her day went well. Denies pain, auditory and visual hallucinations. Maintained on routine safety checks.  Medications given as prescribed.  Support and encouragement offered as needed.  Attended group and participated.  Will continue to monitor.

## 2018-05-01 NOTE — Plan of Care (Signed)
  Problem: Coping: Goal: Ability to identify and develop effective coping behavior will improve Outcome: Progressing   Problem: Activity: Goal: Interest or engagement in leisure activities will improve Outcome: Progressing   Problem: Role Relationship: Goal: Will demonstrate positive changes in social behaviors and relationships Outcome: Progressing
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
## 2018-05-01 NOTE — Plan of Care (Signed)
Pt was able to identify triggers and coping skills for anger at completion of recreation therapy group sessions.   Patricia [name], LRT/CTRS

## 2018-05-01 NOTE — Progress Notes (Signed)
Patient discharged to lobby. Patient was stable and appreciative at that time. All papers, samples and prescriptions were given and valuables returned. Verbal understanding expressed. Denies SI/HI and A/VH. Patient given opportunity to express concerns and ask questions.  
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
## 2018-05-20 ENCOUNTER — Other Ambulatory Visit: Payer: Self-pay

## 2018-05-20 ENCOUNTER — Encounter (HOSPITAL_COMMUNITY): Payer: Self-pay

## 2018-05-20 ENCOUNTER — Emergency Department (HOSPITAL_COMMUNITY)
Admission: EM | Admit: 2018-05-20 | Discharge: 2018-05-21 | Disposition: A | Discharge status: Home / Self Care | Payer: Medicaid Other | Attending: Emergency Medicine | Admitting: Emergency Medicine

## 2018-05-20 DIAGNOSIS — S058X2A Other injuries of left eye and orbit, initial encounter: Secondary | ICD-10-CM | POA: Diagnosis not present

## 2018-05-20 DIAGNOSIS — S058X1A Other injuries of right eye and orbit, initial encounter: Secondary | ICD-10-CM | POA: Diagnosis not present

## 2018-05-20 DIAGNOSIS — Y9389 Activity, other specified: Secondary | ICD-10-CM | POA: Insufficient documentation

## 2018-05-20 DIAGNOSIS — I10 Essential (primary) hypertension: Secondary | ICD-10-CM | POA: Insufficient documentation

## 2018-05-20 DIAGNOSIS — E119 Type 2 diabetes mellitus without complications: Secondary | ICD-10-CM | POA: Diagnosis not present

## 2018-05-20 DIAGNOSIS — Z7984 Long term (current) use of oral hypoglycemic drugs: Secondary | ICD-10-CM | POA: Diagnosis not present

## 2018-05-20 DIAGNOSIS — Z79899 Other long term (current) drug therapy: Secondary | ICD-10-CM | POA: Insufficient documentation

## 2018-05-20 DIAGNOSIS — T07XXXA Unspecified multiple injuries, initial encounter: Secondary | ICD-10-CM | POA: Diagnosis not present

## 2018-05-20 DIAGNOSIS — Y92009 Unspecified place in unspecified non-institutional (private) residence as the place of occurrence of the external cause: Secondary | ICD-10-CM | POA: Insufficient documentation

## 2018-05-20 DIAGNOSIS — Z87891 Personal history of nicotine dependence: Secondary | ICD-10-CM | POA: Insufficient documentation

## 2018-05-20 DIAGNOSIS — H538 Other visual disturbances: Secondary | ICD-10-CM

## 2018-05-20 DIAGNOSIS — Y999 Unspecified external cause status: Secondary | ICD-10-CM | POA: Insufficient documentation

## 2018-05-20 DIAGNOSIS — S0990XA Unspecified injury of head, initial encounter: Secondary | ICD-10-CM | POA: Diagnosis present

## 2018-05-20 LAB — CBG MONITORING, ED: Glucose-Capillary: 116 mg/dL — ABNORMAL HIGH (ref 70–99)

## 2018-05-20 MED ORDER — FLUORESCEIN SODIUM 1 MG OP STRP
1.0000 | ORAL_STRIP | Freq: Once | OPHTHALMIC | Status: AC
  Administered 2018-05-20: 1 via OPHTHALMIC
  Filled 2018-05-20: qty 1

## 2018-05-20 MED ORDER — HYDROCODONE-ACETAMINOPHEN 5-325 MG PO TABS
2.0000 | ORAL_TABLET | Freq: Once | ORAL | Status: AC
  Administered 2018-05-20: 2 via ORAL
  Filled 2018-05-20: qty 2

## 2018-05-20 MED ORDER — TETRACAINE HCL 0.5 % OP SOLN
2.0000 [drp] | Freq: Once | OPHTHALMIC | Status: AC
  Administered 2018-05-20: 2 [drp] via OPHTHALMIC
  Filled 2018-05-20: qty 4

## 2018-05-20 MED ORDER — HYDROCODONE-ACETAMINOPHEN 5-325 MG PO TABS: 1.0000 | ORAL_TABLET | Freq: Four times a day (QID) | ORAL | 0 refills | Status: DC | PRN

## 2018-05-20 NOTE — ED Notes (Signed)
Patient states that she has pain all over her body. States that she feels nauseated at this time. Will make RN Merry Proud aware.

## 2018-05-20 NOTE — ED Notes (Signed)
Pt reports sinus congestion and previous started getting over head cold.
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
## 2018-07-27 ENCOUNTER — Other Ambulatory Visit: Payer: Self-pay

## 2018-07-27 ENCOUNTER — Encounter (HOSPITAL_COMMUNITY): Payer: Self-pay

## 2018-07-27 ENCOUNTER — Emergency Department (HOSPITAL_COMMUNITY)
Admission: EM | Admit: 2018-07-27 | Discharge: 2018-07-27 | Disposition: A | Discharge status: Home / Self Care | Payer: Medicaid Other | Attending: Emergency Medicine | Admitting: Emergency Medicine

## 2018-07-27 DIAGNOSIS — J45909 Unspecified asthma, uncomplicated: Secondary | ICD-10-CM | POA: Insufficient documentation

## 2018-07-27 DIAGNOSIS — Z87891 Personal history of nicotine dependence: Secondary | ICD-10-CM | POA: Insufficient documentation

## 2018-07-27 DIAGNOSIS — B9789 Other viral agents as the cause of diseases classified elsewhere: Secondary | ICD-10-CM

## 2018-07-27 DIAGNOSIS — Z7984 Long term (current) use of oral hypoglycemic drugs: Secondary | ICD-10-CM | POA: Insufficient documentation

## 2018-07-27 DIAGNOSIS — Z79899 Other long term (current) drug therapy: Secondary | ICD-10-CM | POA: Diagnosis not present

## 2018-07-27 DIAGNOSIS — I10 Essential (primary) hypertension: Secondary | ICD-10-CM | POA: Diagnosis not present

## 2018-07-27 DIAGNOSIS — E119 Type 2 diabetes mellitus without complications: Secondary | ICD-10-CM | POA: Insufficient documentation

## 2018-07-27 DIAGNOSIS — J069 Acute upper respiratory infection, unspecified: Secondary | ICD-10-CM | POA: Diagnosis not present

## 2018-07-27 DIAGNOSIS — R05 Cough: Secondary | ICD-10-CM | POA: Diagnosis present

## 2018-07-27 MED ORDER — LORATADINE-PSEUDOEPHEDRINE ER 5-120 MG PO TB12: 1.0000 | ORAL_TABLET | Freq: Two times a day (BID) | ORAL | 0 refills | Status: DC

## 2018-07-27 MED ORDER — PREDNISONE 20 MG PO TABS
40.0000 mg | ORAL_TABLET | Freq: Once | ORAL | Status: AC
  Administered 2018-07-27: 40 mg via ORAL
  Filled 2018-07-27: qty 2

## 2018-07-27 MED ORDER — DEXAMETHASONE 4 MG PO TABS: ORAL_TABLET | ORAL | 0 refills | Status: DC

## 2018-07-27 MED ORDER — HYDROCOD POLST-CPM POLST ER 10-8 MG/5ML PO SUER
5.0000 mL | Freq: Once | ORAL | Status: AC
  Administered 2018-07-27: 5 mL via ORAL
  Filled 2018-07-27: qty 5

## 2018-07-27 MED ORDER — PROMETHAZINE-DM 6.25-15 MG/5ML PO SYRP: 5.0000 mL | ORAL_SOLUTION | Freq: Four times a day (QID) | ORAL | 0 refills | Status: DC | PRN

## 2018-07-27 MED ORDER — ALBUTEROL SULFATE HFA 108 (90 BASE) MCG/ACT IN AERS
2.0000 | INHALATION_SPRAY | Freq: Once | RESPIRATORY_TRACT | Status: AC
  Administered 2018-07-27: 2 via RESPIRATORY_TRACT
  Filled 2018-07-27: qty 6.7

## 2018-07-27 MED ORDER — ACETAMINOPHEN 500 MG PO TABS
1000.0000 mg | ORAL_TABLET | Freq: Once | ORAL | Status: AC
  Administered 2018-07-27: 1000 mg via ORAL
  Filled 2018-07-27: qty 2

## 2018-07-27 MED ORDER — PSEUDOEPHEDRINE HCL 60 MG PO TABS
60.0000 mg | ORAL_TABLET | Freq: Once | ORAL | Status: AC
  Administered 2018-07-27: 60 mg via ORAL
  Filled 2018-07-27: qty 1

## 2018-07-27 NOTE — Discharge Instructions (Addendum)
Your vital signs have been reviewed.  Your blood pressure is slightly elevated at 146/92.  Your oxygen level is 97% on room air.  This is within normal limits.  Your examination favors an upper respiratory infection with cough. Please use albuterol 2 puffs every 4 hours.  Use Decadron and Claritin-D 2 times daily or every 12 hours.  Use Promethazine DM for cough every 6 hours or 4 times a day.  This medication may cause drowsiness.  Please use it with caution.  Please do not drive a vehicle, operate machinery or participate in activities requiring concentration when taking this medication.  Wash hands frequently.
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
## 2018-07-27 NOTE — ED Triage Notes (Signed)
Pt c/o feeling stopped up, achy, nauseated, headache, and sore throat for the past week.

## 2019-04-13 ENCOUNTER — Other Ambulatory Visit: Payer: Self-pay

## 2019-04-13 ENCOUNTER — Encounter (HOSPITAL_COMMUNITY): Payer: Self-pay | Admitting: Emergency Medicine

## 2019-04-13 ENCOUNTER — Emergency Department (HOSPITAL_COMMUNITY)
Admission: EM | Admit: 2019-04-13 | Discharge: 2019-04-13 | Disposition: A | Discharge status: Home / Self Care | Payer: Medicaid Other | Attending: Emergency Medicine | Admitting: Emergency Medicine

## 2019-04-13 DIAGNOSIS — I10 Essential (primary) hypertension: Secondary | ICD-10-CM | POA: Insufficient documentation

## 2019-04-13 DIAGNOSIS — E119 Type 2 diabetes mellitus without complications: Secondary | ICD-10-CM | POA: Insufficient documentation

## 2019-04-13 DIAGNOSIS — Z91010 Allergy to peanuts: Secondary | ICD-10-CM | POA: Insufficient documentation

## 2019-04-13 DIAGNOSIS — Z79899 Other long term (current) drug therapy: Secondary | ICD-10-CM | POA: Insufficient documentation

## 2019-04-13 DIAGNOSIS — J45909 Unspecified asthma, uncomplicated: Secondary | ICD-10-CM | POA: Insufficient documentation

## 2019-04-13 DIAGNOSIS — Z87891 Personal history of nicotine dependence: Secondary | ICD-10-CM | POA: Diagnosis not present

## 2019-04-13 DIAGNOSIS — R51 Headache: Secondary | ICD-10-CM | POA: Insufficient documentation

## 2019-04-13 DIAGNOSIS — Z7984 Long term (current) use of oral hypoglycemic drugs: Secondary | ICD-10-CM | POA: Diagnosis not present

## 2019-04-13 DIAGNOSIS — R519 Headache, unspecified: Secondary | ICD-10-CM

## 2019-04-13 MED ORDER — DIPHENHYDRAMINE HCL 12.5 MG/5ML PO ELIX
25.0000 mg | ORAL_SOLUTION | Freq: Once | ORAL | Status: AC
  Administered 2019-04-13: 25 mg via ORAL
  Filled 2019-04-13: qty 10

## 2019-04-13 MED ORDER — PROCHLORPERAZINE EDISYLATE 10 MG/2ML IJ SOLN
5.0000 mg | Freq: Once | INTRAMUSCULAR | Status: AC
  Administered 2019-04-13: 5 mg via INTRAVENOUS
  Filled 2019-04-13: qty 2

## 2019-04-13 MED ORDER — KETOROLAC TROMETHAMINE 30 MG/ML IJ SOLN
30.0000 mg | Freq: Once | INTRAMUSCULAR | Status: AC
  Administered 2019-04-13: 30 mg via INTRAVENOUS
  Filled 2019-04-13: qty 1

## 2019-04-13 NOTE — Discharge Instructions (Addendum)
Your blood pressure was elevated tonight at 161/114, please have this rechecked soon.  You were treated tonight with multiple medications in the IV.  These medications may cause drowsiness, please use caution getting around tonight.  Please see your primary physician or return to the emergency department if there is any worsening of your symptoms, changes in your condition, problems, or concerns.

## 2019-04-13 NOTE — ED Triage Notes (Signed)
Pt c/o headache with nausea since Saturday and vomited once.
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
## 2019-04-13 NOTE — ED Notes (Signed)
Patient states that she feels better.

## 2019-04-14 ENCOUNTER — Other Ambulatory Visit: Payer: Self-pay

## 2019-04-14 ENCOUNTER — Encounter (HOSPITAL_COMMUNITY): Payer: Self-pay | Admitting: Emergency Medicine

## 2019-04-14 ENCOUNTER — Emergency Department (HOSPITAL_COMMUNITY): Payer: Medicaid Other

## 2019-04-14 ENCOUNTER — Emergency Department (HOSPITAL_COMMUNITY)
Admission: EM | Admit: 2019-04-14 | Discharge: 2019-04-14 | Disposition: A | Discharge status: Home / Self Care | Payer: Medicaid Other | Attending: Emergency Medicine | Admitting: Emergency Medicine

## 2019-04-14 DIAGNOSIS — Z87891 Personal history of nicotine dependence: Secondary | ICD-10-CM | POA: Diagnosis not present

## 2019-04-14 DIAGNOSIS — G43909 Migraine, unspecified, not intractable, without status migrainosus: Secondary | ICD-10-CM | POA: Diagnosis present

## 2019-04-14 DIAGNOSIS — Z79899 Other long term (current) drug therapy: Secondary | ICD-10-CM | POA: Insufficient documentation

## 2019-04-14 DIAGNOSIS — Z91010 Allergy to peanuts: Secondary | ICD-10-CM | POA: Insufficient documentation

## 2019-04-14 DIAGNOSIS — E119 Type 2 diabetes mellitus without complications: Secondary | ICD-10-CM | POA: Diagnosis not present

## 2019-04-14 DIAGNOSIS — G43809 Other migraine, not intractable, without status migrainosus: Secondary | ICD-10-CM | POA: Diagnosis not present

## 2019-04-14 DIAGNOSIS — Z20828 Contact with and (suspected) exposure to other viral communicable diseases: Secondary | ICD-10-CM | POA: Insufficient documentation

## 2019-04-14 DIAGNOSIS — I10 Essential (primary) hypertension: Secondary | ICD-10-CM | POA: Diagnosis not present

## 2019-04-14 DIAGNOSIS — J45909 Unspecified asthma, uncomplicated: Secondary | ICD-10-CM | POA: Insufficient documentation

## 2019-04-14 LAB — CBG MONITORING, ED: Glucose-Capillary: 86 mg/dL (ref 70–99)

## 2019-04-14 MED ORDER — METOCLOPRAMIDE HCL 5 MG/ML IJ SOLN
10.0000 mg | Freq: Once | INTRAMUSCULAR | Status: DC
  Filled 2019-04-14: qty 2

## 2019-04-14 MED ORDER — SODIUM CHLORIDE 0.9 % IV BOLUS: 1000.0000 mL | Freq: Once | INTRAVENOUS | Status: DC

## 2019-04-14 MED ORDER — METOCLOPRAMIDE HCL 5 MG/ML IJ SOLN
10.0000 mg | Freq: Once | INTRAMUSCULAR | Status: AC
  Administered 2019-04-14: 10 mg via INTRAMUSCULAR

## 2019-04-14 MED ORDER — DIPHENHYDRAMINE HCL 50 MG/ML IJ SOLN
25.0000 mg | Freq: Once | INTRAMUSCULAR | Status: DC
  Filled 2019-04-14: qty 1

## 2019-04-14 MED ORDER — HYDROMORPHONE HCL 1 MG/ML IJ SOLN
1.0000 mg | Freq: Once | INTRAMUSCULAR | Status: AC
  Administered 2019-04-14: 1 mg via INTRAMUSCULAR
  Filled 2019-04-14: qty 1

## 2019-04-14 MED ORDER — DIPHENHYDRAMINE HCL 50 MG/ML IJ SOLN
25.0000 mg | Freq: Once | INTRAMUSCULAR | Status: AC
  Administered 2019-04-14: 25 mg via INTRAMUSCULAR

## 2019-04-14 MED ORDER — DEXAMETHASONE SODIUM PHOSPHATE 10 MG/ML IJ SOLN
10.0000 mg | Freq: Once | INTRAMUSCULAR | Status: AC
  Administered 2019-04-14: 10 mg via INTRAMUSCULAR

## 2019-04-14 MED ORDER — DEXAMETHASONE SODIUM PHOSPHATE 10 MG/ML IJ SOLN
10.0000 mg | Freq: Once | INTRAMUSCULAR | Status: DC
  Filled 2019-04-14: qty 1

## 2019-04-14 NOTE — ED Triage Notes (Signed)
Seen in ED last night for migraine.  C/o migraine sensitivity to light, rating pain 10/10.  Reports getting toradal, compazine and benadryl.  Vomited once today.

## 2019-04-14 NOTE — Discharge Instructions (Addendum)
See your Physician for recheck.  °

## 2019-04-14 NOTE — ED Notes (Signed)
Advised patient not to drive after discharge due to narcotic medication administration. Patient verbalized understanding. Discharged to waiting room to wait for family to drive patient home.

## 2019-04-14 NOTE — ED Notes (Signed)
Unable to obtain IV access after several attempts. Patient states "I'm a very hard stick. They have a hard time every time." Advised PA, Order changed to IM medications and CBG.
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
## 2019-04-14 NOTE — ED Notes (Signed)
While giving patient medications, noticed patient has dry cough. Patient states "it's been going on about a week. My friend said I should get tested for that Covid." [place], [place], verbal order for send out covid test to be performed.

## 2019-04-14 NOTE — ED Provider Notes (Signed)
Pt reports headache unchanged by Migraine cocktail.   Pt given dilaudid 1 mg.  Ct scan obtained.  Ct head  Normal.  Pt advised that decadron should help headache.  Pt advised to go home and try to sleep.    Sidney Ace 04/14/19 2108    Milton Ferguson, MD 04/15/19 1158

## 2019-04-16 LAB — NOVEL CORONAVIRUS, NAA (HOSP ORDER, SEND-OUT TO REF LAB; TAT 18-24 HRS): SARS-CoV-2, NAA: NOT DETECTED

## 2020-03-11 ENCOUNTER — Ambulatory Visit: Payer: Medicaid Other | Attending: Internal Medicine

## 2020-03-11 DIAGNOSIS — Z23 Encounter for immunization: Secondary | ICD-10-CM

## 2020-03-11 NOTE — Progress Notes (Signed)
   Covid-19 Vaccination Clinic  Name:  Patricia [name]    MRN: [id number] DOB: 1970/08/08  03/11/2020  [name] was observed post Covid-19 immunization for 15 minutes without incident. She was provided with Vaccine Information Sheet and instruction to access the V-Safe system.   [name] was instructed to call 911 with any severe reactions post vaccine: Marland Kitchen Difficulty breathing  . Swelling of face and throat  . A fast heartbeat  . A bad rash all over body  . Dizziness and weakness   Immunizations Administered    Name Date Dose VIS Date Route   Pfizer COVID-19 Vaccine 03/11/2020 12:16 PM 0.3 mL 09/22/2018 Intramuscular   Manufacturer: [place]   Lot: [id number]   [place]: [address]-7

## 2020-06-03 ENCOUNTER — Encounter (HOSPITAL_COMMUNITY): Payer: Self-pay | Admitting: *Deleted

## 2020-06-03 ENCOUNTER — Other Ambulatory Visit: Payer: Self-pay

## 2020-06-03 ENCOUNTER — Emergency Department (HOSPITAL_COMMUNITY)
Admission: EM | Admit: 2020-06-03 | Discharge: 2020-06-03 | Disposition: A | Discharge status: Home / Self Care | Payer: Medicaid Other | Attending: Emergency Medicine | Admitting: Emergency Medicine

## 2020-06-03 DIAGNOSIS — Z87891 Personal history of nicotine dependence: Secondary | ICD-10-CM | POA: Diagnosis not present

## 2020-06-03 DIAGNOSIS — E119 Type 2 diabetes mellitus without complications: Secondary | ICD-10-CM | POA: Insufficient documentation

## 2020-06-03 DIAGNOSIS — M545 Low back pain, unspecified: Secondary | ICD-10-CM

## 2020-06-03 DIAGNOSIS — J45909 Unspecified asthma, uncomplicated: Secondary | ICD-10-CM | POA: Diagnosis not present

## 2020-06-03 DIAGNOSIS — Z7984 Long term (current) use of oral hypoglycemic drugs: Secondary | ICD-10-CM | POA: Insufficient documentation

## 2020-06-03 DIAGNOSIS — Z79899 Other long term (current) drug therapy: Secondary | ICD-10-CM | POA: Diagnosis not present

## 2020-06-03 DIAGNOSIS — Z91010 Allergy to peanuts: Secondary | ICD-10-CM | POA: Diagnosis not present

## 2020-06-03 DIAGNOSIS — I10 Essential (primary) hypertension: Secondary | ICD-10-CM | POA: Insufficient documentation

## 2020-06-03 MED ORDER — KETOROLAC TROMETHAMINE 60 MG/2ML IM SOLN
30.0000 mg | Freq: Once | INTRAMUSCULAR | Status: AC
  Administered 2020-06-03: 30 mg via INTRAMUSCULAR
  Filled 2020-06-03: qty 2

## 2020-06-03 MED ORDER — CELECOXIB 200 MG PO CAPS: 200.0000 mg | ORAL_CAPSULE | Freq: Two times a day (BID) | ORAL | 0 refills | Status: DC

## 2020-06-03 MED ORDER — OXYCODONE-ACETAMINOPHEN 5-325 MG PO TABS
1.0000 | ORAL_TABLET | Freq: Once | ORAL | Status: AC
  Administered 2020-06-03: 1 via ORAL
  Filled 2020-06-03: qty 1

## 2020-06-03 NOTE — ED Triage Notes (Signed)
Chronic back pain, gabapentin not working
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
## 2020-06-03 NOTE — Discharge Instructions (Signed)
SEEK IMMEDIATE MEDICAL ATTENTION IF: New numbness, tingling, weakness, or problem with the use of your arms or legs.  Severe back pain not relieved with medications.  Change in bowel or bladder control.  Increasing pain in any areas of the body (such as chest or abdominal pain).  Shortness of breath, dizziness or fainting.  Nausea (feeling sick to your stomach), vomiting, fever, or sweats.  

## 2020-06-03 NOTE — ED Notes (Signed)
Pt followed by [facility] health dept  Reports dx'd with "pinched nerve" several years ago   Takes gabapentin and flexeril for same   Reports 3 day history of increasing back pain   Today when home health nurse came, she hurt so badly she could not get out of bed   Here for eval   Ambulates heel to toe  Denies fecal or urinary incontinence  Awaiting eval

## 2020-09-06 ENCOUNTER — Emergency Department (HOSPITAL_COMMUNITY): Payer: Medicaid Other

## 2020-09-06 ENCOUNTER — Encounter (HOSPITAL_COMMUNITY): Payer: Self-pay | Admitting: Emergency Medicine

## 2020-09-06 ENCOUNTER — Other Ambulatory Visit: Payer: Self-pay

## 2020-09-06 ENCOUNTER — Emergency Department (HOSPITAL_COMMUNITY)
Admission: EM | Admit: 2020-09-06 | Discharge: 2020-09-06 | Disposition: A | Discharge status: Home / Self Care | Payer: Medicaid Other | Attending: Emergency Medicine | Admitting: Emergency Medicine

## 2020-09-06 DIAGNOSIS — Z7984 Long term (current) use of oral hypoglycemic drugs: Secondary | ICD-10-CM | POA: Insufficient documentation

## 2020-09-06 DIAGNOSIS — R519 Headache, unspecified: Secondary | ICD-10-CM | POA: Diagnosis not present

## 2020-09-06 DIAGNOSIS — Z79899 Other long term (current) drug therapy: Secondary | ICD-10-CM | POA: Diagnosis not present

## 2020-09-06 DIAGNOSIS — I10 Essential (primary) hypertension: Secondary | ICD-10-CM | POA: Diagnosis not present

## 2020-09-06 DIAGNOSIS — R0981 Nasal congestion: Secondary | ICD-10-CM | POA: Diagnosis not present

## 2020-09-06 DIAGNOSIS — R1031 Right lower quadrant pain: Secondary | ICD-10-CM | POA: Diagnosis present

## 2020-09-06 DIAGNOSIS — J3489 Other specified disorders of nose and nasal sinuses: Secondary | ICD-10-CM | POA: Diagnosis not present

## 2020-09-06 DIAGNOSIS — Z91010 Allergy to peanuts: Secondary | ICD-10-CM | POA: Insufficient documentation

## 2020-09-06 DIAGNOSIS — Z87891 Personal history of nicotine dependence: Secondary | ICD-10-CM | POA: Insufficient documentation

## 2020-09-06 DIAGNOSIS — R112 Nausea with vomiting, unspecified: Secondary | ICD-10-CM | POA: Insufficient documentation

## 2020-09-06 DIAGNOSIS — K219 Gastro-esophageal reflux disease without esophagitis: Secondary | ICD-10-CM | POA: Insufficient documentation

## 2020-09-06 DIAGNOSIS — Z90711 Acquired absence of uterus with remaining cervical stump: Secondary | ICD-10-CM | POA: Insufficient documentation

## 2020-09-06 DIAGNOSIS — K439 Ventral hernia without obstruction or gangrene: Secondary | ICD-10-CM | POA: Diagnosis not present

## 2020-09-06 DIAGNOSIS — E119 Type 2 diabetes mellitus without complications: Secondary | ICD-10-CM | POA: Insufficient documentation

## 2020-09-06 DIAGNOSIS — R059 Cough, unspecified: Secondary | ICD-10-CM | POA: Insufficient documentation

## 2020-09-06 DIAGNOSIS — J45909 Unspecified asthma, uncomplicated: Secondary | ICD-10-CM | POA: Diagnosis not present

## 2020-09-06 DIAGNOSIS — Z20822 Contact with and (suspected) exposure to covid-19: Secondary | ICD-10-CM | POA: Diagnosis not present

## 2020-09-06 LAB — CBC WITH DIFFERENTIAL/PLATELET
Abs Immature Granulocytes: 0.01 10*3/uL (ref 0.00–0.07)
Basophils Absolute: 0 10*3/uL (ref 0.0–0.1)
Basophils Relative: 0 %
Eosinophils Absolute: 0 10*3/uL (ref 0.0–0.5)
Eosinophils Relative: 1 %
HCT: 38.8 % (ref 36.0–46.0)
Hemoglobin: 12.4 g/dL (ref 12.0–15.0)
Immature Granulocytes: 0 %
Lymphocytes Relative: 36 %
Lymphs Abs: 2.6 10*3/uL (ref 0.7–4.0)
MCH: 27.4 pg (ref 26.0–34.0)
MCHC: 32 g/dL (ref 30.0–36.0)
MCV: 85.7 fL (ref 80.0–100.0)
Monocytes Absolute: 0.4 10*3/uL (ref 0.1–1.0)
Monocytes Relative: 6 %
Neutro Abs: 4.1 10*3/uL (ref 1.7–7.7)
Neutrophils Relative %: 57 %
Platelets: 297 10*3/uL (ref 150–400)
RBC: 4.53 MIL/uL (ref 3.87–5.11)
RDW: 13.6 % (ref 11.5–15.5)
WBC: 7.2 10*3/uL (ref 4.0–10.5)
nRBC: 0 % (ref 0.0–0.2)

## 2020-09-06 LAB — COMPREHENSIVE METABOLIC PANEL
ALT: 10 U/L (ref 0–44)
AST: 14 U/L — ABNORMAL LOW (ref 15–41)
Albumin: 4 g/dL (ref 3.5–5.0)
Alkaline Phosphatase: 38 U/L (ref 38–126)
Anion gap: 7 (ref 5–15)
BUN: 12 mg/dL (ref 6–20)
CO2: 23 mmol/L (ref 22–32)
Calcium: 9.2 mg/dL (ref 8.9–10.3)
Chloride: 107 mmol/L (ref 98–111)
Creatinine, Ser: 0.83 mg/dL (ref 0.44–1.00)
GFR, Estimated: >60 mL/min (ref >60)
Glucose, Bld: 91 mg/dL (ref 70–99)
Potassium: 3.5 mmol/L (ref 3.5–5.1)
Sodium: 137 mmol/L (ref 135–145)
Total Bilirubin: 0.7 mg/dL (ref 0.3–1.2)
Total Protein: 7.1 g/dL (ref 6.5–8.1)

## 2020-09-06 LAB — URINALYSIS, ROUTINE W REFLEX MICROSCOPIC
Bilirubin Urine: NEGATIVE
Glucose, UA: NEGATIVE mg/dL
Hgb urine dipstick: NEGATIVE
Ketones, ur: NEGATIVE mg/dL
Leukocytes,Ua: NEGATIVE
Nitrite: NEGATIVE
Protein, ur: NEGATIVE mg/dL
Specific Gravity, Urine: 1.016 (ref 1.005–1.030)
pH: 6 (ref 5.0–8.0)

## 2020-09-06 LAB — LIPASE, BLOOD: Lipase: 31 U/L (ref 11–51)

## 2020-09-06 MED ORDER — CLONIDINE HCL 0.2 MG PO TABS: 0.2000 mg | ORAL_TABLET | Freq: Two times a day (BID) | ORAL | 0 refills | Status: DC

## 2020-09-06 MED ORDER — MORPHINE SULFATE (PF) 4 MG/ML IV SOLN
4.0000 mg | Freq: Once | INTRAVENOUS | Status: AC
  Administered 2020-09-06: 4 mg via INTRAVENOUS
  Filled 2020-09-06: qty 1

## 2020-09-06 MED ORDER — IOHEXOL 300 MG/ML  SOLN
100.0000 mL | Freq: Once | INTRAMUSCULAR | Status: AC | PRN
  Administered 2020-09-06: 100 mL via INTRAVENOUS

## 2020-09-06 MED ORDER — ONDANSETRON HCL 4 MG/2ML IJ SOLN
4.0000 mg | Freq: Once | INTRAMUSCULAR | Status: AC
  Administered 2020-09-06: 4 mg via INTRAVENOUS
  Filled 2020-09-06: qty 2

## 2020-09-06 MED ORDER — HYDROCODONE-ACETAMINOPHEN 5-325 MG PO TABS: ORAL_TABLET | ORAL | 0 refills | Status: DC

## 2020-09-06 MED ORDER — FENTANYL CITRATE (PF) 100 MCG/2ML IJ SOLN
25.0000 ug | Freq: Once | INTRAMUSCULAR | Status: AC
Start: 2020-09-06 — End: 2020-09-06
  Administered 2020-09-06: 25 ug via INTRAVENOUS
  Filled 2020-09-06: qty 2

## 2020-09-06 MED ORDER — AMLODIPINE BESYLATE 5 MG PO TABS: 5.0000 mg | ORAL_TABLET | Freq: Every day | ORAL | 0 refills | Status: DC
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
## 2020-09-06 NOTE — Discharge Instructions (Addendum)
Your blood work and urinalysis today were reassuring.  The CT scan of your abdomen shows that you have a small hernia of your lower abdomen.  This can be followed up by your primary care provider.  Also, your blood pressure this evening is elevated.  I have prescribed blood pressure medication for you until you can follow-up with your doctor.  This will need to be rechecked in 1 to 2 weeks.  Return to the emergency department if you develop any worsening symptoms such as increasing pain, fever, or persistent vomiting.  Your Covid test is pending, your results will likely be available tomorrow.  You will be contacted if your results are positive.If you you have seen mold in your home and your Covid test is negative, your respiratory symptoms could be related to mold.

## 2020-09-06 NOTE — ED Notes (Signed)
Pt to CT

## 2020-09-06 NOTE — ED Triage Notes (Signed)
Pt c/o n/v and headache and coughing that started this morning.

## 2020-09-07 LAB — SARS CORONAVIRUS 2 (TAT 6-24 HRS): SARS Coronavirus 2: NEGATIVE

## 2020-11-28 ENCOUNTER — Emergency Department: Payer: Medicaid Other

## 2020-11-28 ENCOUNTER — Emergency Department
Admission: EM | Admit: 2020-11-28 | Discharge: 2020-11-28 | Disposition: A | Discharge status: Home / Self Care | Payer: Medicaid Other | Attending: Emergency Medicine | Admitting: Emergency Medicine

## 2020-11-28 ENCOUNTER — Other Ambulatory Visit: Payer: Self-pay

## 2020-11-28 DIAGNOSIS — R1110 Vomiting, unspecified: Secondary | ICD-10-CM | POA: Diagnosis not present

## 2020-11-28 DIAGNOSIS — R1011 Right upper quadrant pain: Secondary | ICD-10-CM | POA: Diagnosis present

## 2020-11-28 DIAGNOSIS — E119 Type 2 diabetes mellitus without complications: Secondary | ICD-10-CM | POA: Diagnosis not present

## 2020-11-28 DIAGNOSIS — Z87891 Personal history of nicotine dependence: Secondary | ICD-10-CM | POA: Insufficient documentation

## 2020-11-28 DIAGNOSIS — Z7984 Long term (current) use of oral hypoglycemic drugs: Secondary | ICD-10-CM | POA: Diagnosis not present

## 2020-11-28 DIAGNOSIS — J45909 Unspecified asthma, uncomplicated: Secondary | ICD-10-CM | POA: Diagnosis not present

## 2020-11-28 DIAGNOSIS — Z7952 Long term (current) use of systemic steroids: Secondary | ICD-10-CM | POA: Diagnosis not present

## 2020-11-28 DIAGNOSIS — Z79899 Other long term (current) drug therapy: Secondary | ICD-10-CM | POA: Diagnosis not present

## 2020-11-28 DIAGNOSIS — Z91010 Allergy to peanuts: Secondary | ICD-10-CM | POA: Insufficient documentation

## 2020-11-28 DIAGNOSIS — I10 Essential (primary) hypertension: Secondary | ICD-10-CM | POA: Insufficient documentation

## 2020-11-28 DIAGNOSIS — R109 Unspecified abdominal pain: Secondary | ICD-10-CM

## 2020-11-28 LAB — COMPREHENSIVE METABOLIC PANEL
ALT: 19 U/L (ref 0–44)
AST: 20 U/L (ref 15–41)
Albumin: 3.7 g/dL (ref 3.5–5.0)
Alkaline Phosphatase: 38 U/L (ref 38–126)
Anion gap: 5 (ref 5–15)
BUN: 10 mg/dL (ref 6–20)
CO2: 24 mmol/L (ref 22–32)
Calcium: 8.7 mg/dL — ABNORMAL LOW (ref 8.9–10.3)
Chloride: 111 mmol/L (ref 98–111)
Creatinine, Ser: 0.68 mg/dL (ref 0.44–1.00)
GFR, Estimated: >60 mL/min (ref >60)
Glucose, Bld: 112 mg/dL — ABNORMAL HIGH (ref 70–99)
Potassium: 3.2 mmol/L — ABNORMAL LOW (ref 3.5–5.1)
Sodium: 140 mmol/L (ref 135–145)
Total Bilirubin: 0.5 mg/dL (ref 0.3–1.2)
Total Protein: 6.5 g/dL (ref 6.5–8.1)

## 2020-11-28 LAB — URINALYSIS, COMPLETE (UACMP) WITH MICROSCOPIC
Bacteria, UA: NONE SEEN
Bilirubin Urine: NEGATIVE
Glucose, UA: NEGATIVE mg/dL
Ketones, ur: NEGATIVE mg/dL
Leukocytes,Ua: NEGATIVE
Nitrite: NEGATIVE
Protein, ur: 30 mg/dL — AB
Specific Gravity, Urine: 1.031 — ABNORMAL HIGH (ref 1.005–1.030)
pH: 5 (ref 5.0–8.0)

## 2020-11-28 LAB — CBC
HCT: 34.7 % — ABNORMAL LOW (ref 36.0–46.0)
Hemoglobin: 11.2 g/dL — ABNORMAL LOW (ref 12.0–15.0)
MCH: 27.3 pg (ref 26.0–34.0)
MCHC: 32.3 g/dL (ref 30.0–36.0)
MCV: 84.4 fL (ref 80.0–100.0)
Platelets: 280 10*3/uL (ref 150–400)
RBC: 4.11 MIL/uL (ref 3.87–5.11)
RDW: 13.6 % (ref 11.5–15.5)
WBC: 6.5 10*3/uL (ref 4.0–10.5)
nRBC: 0 % (ref 0.0–0.2)

## 2020-11-28 LAB — LIPASE, BLOOD: Lipase: 35 U/L (ref 11–51)

## 2020-11-28 LAB — POC URINE PREG, ED: Preg Test, Ur: NEGATIVE

## 2020-11-28 MED ORDER — POTASSIUM CHLORIDE CRYS ER 20 MEQ PO TBCR
40.0000 meq | EXTENDED_RELEASE_TABLET | Freq: Once | ORAL | Status: AC
  Administered 2020-11-28: 40 meq via ORAL
  Filled 2020-11-28: qty 2

## 2020-11-28 MED ORDER — DICYCLOMINE HCL 10 MG PO CAPS: 10.0000 mg | ORAL_CAPSULE | Freq: Four times a day (QID) | ORAL | 0 refills | Status: DC

## 2020-11-28 MED ORDER — PROMETHAZINE HCL 12.5 MG PO TABS: 12.5000 mg | ORAL_TABLET | Freq: Four times a day (QID) | ORAL | 0 refills | Status: DC | PRN

## 2020-11-28 MED ORDER — ONDANSETRON 4 MG PO TBDP: 4.0000 mg | ORAL_TABLET | Freq: Once | ORAL | Status: DC

## 2020-11-28 MED ORDER — IOHEXOL 300 MG/ML  SOLN
100.0000 mL | Freq: Once | INTRAMUSCULAR | Status: AC | PRN
  Administered 2020-11-28: 100 mL via INTRAVENOUS
  Filled 2020-11-28: qty 100

## 2020-11-28 MED ORDER — ONDANSETRON HCL 4 MG/2ML IJ SOLN
4.0000 mg | INTRAMUSCULAR | Status: AC
  Administered 2020-11-28: 4 mg via INTRAVENOUS
  Filled 2020-11-28: qty 2

## 2020-11-28 MED ORDER — SODIUM CHLORIDE 0.9 % IV BOLUS
500.0000 mL | Freq: Once | INTRAVENOUS | Status: AC
  Administered 2020-11-28: 500 mL via INTRAVENOUS

## 2020-11-28 MED ORDER — HYDROMORPHONE HCL 1 MG/ML IJ SOLN
0.5000 mg | INTRAMUSCULAR | Status: AC
Start: 2020-11-28 — End: 2020-11-28
  Administered 2020-11-28: 0.5 mg via INTRAVENOUS
  Filled 2020-11-28: qty 1
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
## 2020-11-28 NOTE — ED Triage Notes (Addendum)
Pt comes with c/o abdominal pain for 2 days and some vomiting. Pt states some loose stools.  Pt also states headache. Pt took zofran at home with no relief.

## 2020-11-28 NOTE — ED Notes (Signed)
Patient states headache is gone, but abdominal pain is still present. Dr. Jacqualine Code aware.

## 2020-11-28 NOTE — ED Provider Notes (Signed)
-----------------------------------------   8:32 PM on 11/28/2020 -----------------------------------------  Blood pressure (!) 177/90, pulse 60, temperature 98.3 F (36.8 C), temperature source Oral, resp. rate 16, height 5\' 4"  (1.626 m), weight 103.9 kg, last menstrual period 07/11/2014, SpO2 100 %.  Assuming care from Dr. Jacqualine Code.  In short, Patricia [name] is a 50 y.o. female with a chief complaint of Abdominal Pain .  Refer to the original H&P for additional details.  The current plan of care is to await CT scan.  Patient presented to emergency department with right-sided abdominal pain.  She has a somewhat complex medical history with previous small bowel resection, hysterectomy, history of a ventral hernia.  Patient has had some pain with her ventral hernia in the past but has never had incarceration.  Patient was complaining of diffuse right-sided abdominal pain with some tenderness.  Previous provider did not palpate an appreciable hernia at this time.  Patient's labs are overall reassuring.  Currently awaiting CT abdomen and pelvis.Marland Kitchen   ----------------------------------------- 10:18 PM on 11/28/2020 -----------------------------------------  Patient CT returns with evidence of hernia but no evidence of incarceration or obstruction.  At this time patient will have symptom control meds of antiemetic/antinausea and Bentyl for pain relief.  No indication for further work-up currently.  Patient is agreeable with this plan and is stable for discharge.    ED diagnosis:   Right-sided abdominal pain Ventral hernia   Darletta Moll, PA-C 11/28/20 2220    Delman Kitten, MD 12/02/20 1727

## 2020-11-28 NOTE — ED Notes (Signed)
Patient reports a negative Covid test this morning.

## 2020-12-04 ENCOUNTER — Emergency Department: Payer: Medicaid Other

## 2020-12-04 ENCOUNTER — Emergency Department
Admission: EM | Admit: 2020-12-04 | Discharge: 2020-12-04 | Disposition: A | Discharge status: Home / Self Care | Payer: Medicaid Other | Attending: Emergency Medicine | Admitting: Emergency Medicine

## 2020-12-04 ENCOUNTER — Other Ambulatory Visit: Payer: Self-pay

## 2020-12-04 DIAGNOSIS — Z79899 Other long term (current) drug therapy: Secondary | ICD-10-CM | POA: Diagnosis not present

## 2020-12-04 DIAGNOSIS — R109 Unspecified abdominal pain: Secondary | ICD-10-CM | POA: Insufficient documentation

## 2020-12-04 DIAGNOSIS — K219 Gastro-esophageal reflux disease without esophagitis: Secondary | ICD-10-CM | POA: Insufficient documentation

## 2020-12-04 DIAGNOSIS — E119 Type 2 diabetes mellitus without complications: Secondary | ICD-10-CM | POA: Diagnosis not present

## 2020-12-04 DIAGNOSIS — Z87891 Personal history of nicotine dependence: Secondary | ICD-10-CM | POA: Insufficient documentation

## 2020-12-04 DIAGNOSIS — Z91010 Allergy to peanuts: Secondary | ICD-10-CM | POA: Diagnosis not present

## 2020-12-04 DIAGNOSIS — Z7984 Long term (current) use of oral hypoglycemic drugs: Secondary | ICD-10-CM | POA: Diagnosis not present

## 2020-12-04 DIAGNOSIS — J45909 Unspecified asthma, uncomplicated: Secondary | ICD-10-CM | POA: Insufficient documentation

## 2020-12-04 DIAGNOSIS — I10 Essential (primary) hypertension: Secondary | ICD-10-CM | POA: Diagnosis not present

## 2020-12-04 LAB — COMPREHENSIVE METABOLIC PANEL
ALT: 12 U/L (ref 0–44)
AST: 14 U/L — ABNORMAL LOW (ref 15–41)
Albumin: 4.1 g/dL (ref 3.5–5.0)
Alkaline Phosphatase: 47 U/L (ref 38–126)
Anion gap: 8 (ref 5–15)
BUN: 13 mg/dL (ref 6–20)
CO2: 22 mmol/L (ref 22–32)
Calcium: 9.6 mg/dL (ref 8.9–10.3)
Chloride: 110 mmol/L (ref 98–111)
Creatinine, Ser: 0.9 mg/dL (ref 0.44–1.00)
GFR, Estimated: >60 mL/min (ref >60)
Glucose, Bld: 96 mg/dL (ref 70–99)
Potassium: 3.7 mmol/L (ref 3.5–5.1)
Sodium: 140 mmol/L (ref 135–145)
Total Bilirubin: 0.6 mg/dL (ref 0.3–1.2)
Total Protein: 7.3 g/dL (ref 6.5–8.1)

## 2020-12-04 LAB — CBC
HCT: 37.8 % (ref 36.0–46.0)
Hemoglobin: 12.2 g/dL (ref 12.0–15.0)
MCH: 27.2 pg (ref 26.0–34.0)
MCHC: 32.3 g/dL (ref 30.0–36.0)
MCV: 84.4 fL (ref 80.0–100.0)
Platelets: 309 10*3/uL (ref 150–400)
RBC: 4.48 MIL/uL (ref 3.87–5.11)
RDW: 13.7 % (ref 11.5–15.5)
WBC: 8.5 10*3/uL (ref 4.0–10.5)
nRBC: 0 % (ref 0.0–0.2)

## 2020-12-04 LAB — LIPASE, BLOOD: Lipase: 39 U/L (ref 11–51)

## 2020-12-04 MED ORDER — ONDANSETRON 4 MG PO TBDP: 4.0000 mg | ORAL_TABLET | Freq: Three times a day (TID) | ORAL | 0 refills | Status: DC | PRN

## 2020-12-04 MED ORDER — MORPHINE SULFATE (PF) 4 MG/ML IV SOLN
4.0000 mg | Freq: Once | INTRAVENOUS | Status: DC
  Filled 2020-12-04: qty 1

## 2020-12-04 MED ORDER — HYDROCODONE-ACETAMINOPHEN 5-325 MG PO TABS: 1.0000 | ORAL_TABLET | ORAL | 0 refills | Status: DC | PRN

## 2020-12-04 MED ORDER — METOCLOPRAMIDE HCL 5 MG/ML IJ SOLN
10.0000 mg | Freq: Once | INTRAMUSCULAR | Status: AC
  Administered 2020-12-04: 10 mg via INTRAVENOUS
  Filled 2020-12-04: qty 2

## 2020-12-04 MED ORDER — KETOROLAC TROMETHAMINE 30 MG/ML IJ SOLN
30.0000 mg | Freq: Once | INTRAMUSCULAR | Status: AC
  Administered 2020-12-04: 30 mg via INTRAVENOUS
  Filled 2020-12-04: qty 1

## 2020-12-04 MED ORDER — SODIUM CHLORIDE 0.9 % IV SOLN
12.5000 mg | Freq: Four times a day (QID) | INTRAVENOUS | Status: DC | PRN
  Filled 2020-12-04: qty 0.5

## 2020-12-04 MED ORDER — HYDROMORPHONE HCL 1 MG/ML IJ SOLN
1.0000 mg | Freq: Once | INTRAMUSCULAR | Status: AC
Start: 2020-12-04 — End: 2020-12-04
  Administered 2020-12-04: 1 mg via INTRAVENOUS
  Filled 2020-12-04: qty 1

## 2020-12-04 MED ORDER — ONDANSETRON HCL 4 MG/2ML IJ SOLN
4.0000 mg | Freq: Once | INTRAMUSCULAR | Status: AC
  Administered 2020-12-04: 4 mg via INTRAVENOUS
  Filled 2020-12-04: qty 2

## 2020-12-04 MED ORDER — SODIUM CHLORIDE 0.9 % IV BOLUS
1000.0000 mL | Freq: Once | INTRAVENOUS | Status: AC
  Administered 2020-12-04: 1000 mL via INTRAVENOUS

## 2020-12-04 MED ORDER — ONDANSETRON 4 MG PO TBDP
ORAL_TABLET | ORAL | Status: AC
  Filled 2020-12-04: qty 1

## 2020-12-04 MED ORDER — IOHEXOL 300 MG/ML  SOLN
100.0000 mL | Freq: Once | INTRAMUSCULAR | Status: AC | PRN
  Administered 2020-12-04: 100 mL via INTRAVENOUS
  Filled 2020-12-04: qty 100

## 2020-12-04 MED ORDER — ONDANSETRON 4 MG PO TBDP
4.0000 mg | ORAL_TABLET | Freq: Once | ORAL | Status: AC
  Administered 2020-12-04: 4 mg via ORAL
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
## 2020-12-04 NOTE — ED Notes (Signed)
This RN informed MD Paduchowski that pt is requesting toradol in place of morphine at this time. Per pt, "morphine doesn't work for me" Pt unable to remember name, this RN stated "toradol?" pt stated "Yes! That works"

## 2020-12-04 NOTE — ED Triage Notes (Signed)
Pt states that she was seen last Tuesday and diagnosed with a small hernia, pt states that she has been using the nausea medicine and has a follow up appt next week but states the pain hit her hard today and pt is tearful and holding her llq

## 2021-01-15 ENCOUNTER — Other Ambulatory Visit: Payer: Self-pay

## 2021-01-16 ENCOUNTER — Other Ambulatory Visit: Payer: Self-pay

## 2021-01-16 ENCOUNTER — Ambulatory Visit (INDEPENDENT_AMBULATORY_CARE_PROVIDER_SITE_OTHER): Payer: Medicaid Other | Admitting: Gastroenterology

## 2021-01-16 ENCOUNTER — Encounter: Payer: Self-pay | Admitting: Gastroenterology

## 2021-01-16 VITALS — BP 166/116 | HR 82 | Temp 98.3°F | Ht 64.0 in | Wt 211.2 lb

## 2021-01-16 DIAGNOSIS — K5909 Other constipation: Secondary | ICD-10-CM

## 2021-01-16 DIAGNOSIS — K219 Gastro-esophageal reflux disease without esophagitis: Secondary | ICD-10-CM | POA: Diagnosis not present

## 2021-01-16 DIAGNOSIS — K5904 Chronic idiopathic constipation: Secondary | ICD-10-CM | POA: Diagnosis not present

## 2021-01-16 DIAGNOSIS — R1030 Lower abdominal pain, unspecified: Secondary | ICD-10-CM

## 2021-01-16 MED ORDER — OMEPRAZOLE 40 MG PO CPDR: 40.0000 mg | DELAYED_RELEASE_CAPSULE | Freq: Two times a day (BID) | ORAL | 2 refills | Status: DC

## 2021-01-16 MED ORDER — NA SULFATE-K SULFATE-MG SULF 17.5-3.13-1.6 GM/177ML PO SOLN: 354.0000 mL | Freq: Once | ORAL | 0 refills | Status: AC

## 2021-01-16 MED ORDER — MAGNESIUM CITRATE PO SOLN: 1.0000 | Freq: Once | ORAL | 1 refills | Status: AC

## 2021-01-16 NOTE — Patient Instructions (Signed)
Please take a whole bottle of magnesium citrate today.  Then start Miralax daily one to two times a day.  Gastroesophageal Reflux Disease, Adult  Gastroesophageal reflux (GER) happens when acid from the stomach flows up into the tube that connects the mouth and the stomach (esophagus). Normally, food travels down the esophagus and stays in the stomach to be digested. With GER, food and stomach acid sometimes move back up into theesophagus. You may have a disease called gastroesophageal reflux disease (GERD) if the reflux: Happens often. Causes frequent or very bad symptoms. Causes problems such as damage to the esophagus. When this happens, the esophagus becomes sore and swollen. Over time, GERD can make small holes (ulcers) in the lining of the esophagus. What are the causes? This condition is caused by a problem with the muscle between the esophagus and the stomach. When this muscle is weak or not normal, it does not close properlyto keep food and acid from coming back up from the stomach. The muscle can be weak because of: Tobacco use. Pregnancy. Having a certain type of hernia (hiatal hernia). Alcohol use. Certain foods and drinks, such as coffee, chocolate, onions, and peppermint. What increases the risk? Being overweight. Having a disease that affects your connective tissue. Taking NSAIDs, such a ibuprofen. What are the signs or symptoms? Heartburn. Difficult or painful swallowing. The feeling of having a lump in the throat. A bitter taste in the mouth. Bad breath. Having a lot of saliva. Having an upset or bloated stomach. Burping. Chest pain. Different conditions can cause chest pain. Make sure you see your doctor if you have chest pain. Shortness of breath or wheezing. A long-term cough or a cough at night. Wearing away of the surface of teeth (tooth enamel). Weight loss. How is this treated? Making changes to your diet. Taking medicine. Having surgery. Treatment will  depend on how bad your symptoms are. Follow these instructions at home: Eating and drinking  Follow a diet as told by your doctor. You may need to avoid foods and drinks such as: Coffee and tea, with or without caffeine. Drinks that contain alcohol. Energy drinks and sports drinks. Bubbly (carbonated) drinks or sodas. Chocolate and cocoa. Peppermint and mint flavorings. Garlic and onions. Horseradish. Spicy and acidic foods. These include peppers, chili powder, curry powder, vinegar, hot sauces, and BBQ sauce. Citrus fruit juices and citrus fruits, such as oranges, lemons, and limes. Tomato-based foods. These include red sauce, chili, salsa, and pizza with red sauce. Fried and fatty foods. These include donuts, french fries, potato chips, and high-fat dressings. High-fat meats. These include hot dogs, rib eye steak, sausage, ham, and bacon. High-fat dairy items, such as whole milk, butter, and cream cheese. Eat small meals often. Avoid eating large meals. Avoid drinking large amounts of liquid with your meals. Avoid eating meals during the 2-3 hours before bedtime. Avoid lying down right after you eat. Do not exercise right after you eat.  Lifestyle  Do not smoke or use any products that contain nicotine or tobacco. If you need help quitting, ask your doctor. Try to lower your stress. If you need help doing this, ask your doctor. If you are overweight, lose an amount of weight that is healthy for you. Ask your doctor about a safe weight loss goal.  General instructions Pay attention to any changes in your symptoms. Take over-the-counter and prescription medicines only as told by your doctor. Do not take aspirin, ibuprofen, or other NSAIDs unless your doctor says it  is okay. Wear loose clothes. Do not wear anything tight around your waist. Raise (elevate) the head of your bed about 6 inches (15 cm). You may need to use a wedge to do this. Avoid bending over if this makes your  symptoms worse. Keep all follow-up visits. Contact a doctor if: You have new symptoms. You lose weight and you do not know why. You have trouble swallowing or it hurts to swallow. You have wheezing or a cough that keeps happening. You have a hoarse voice. Your symptoms do not get better with treatment. Get help right away if: You have sudden pain in your arms, neck, jaw, teeth, or back. You suddenly feel sweaty, dizzy, or light-headed. You have chest pain or shortness of breath. You vomit and the vomit is green, yellow, or black, or it looks like blood or coffee grounds. You faint. Your poop (stool) is red, bloody, or black. You cannot swallow, drink, or eat. These symptoms may represent a serious problem that is an emergency. Do not wait to see if the symptoms will go away. Get medical help right away. Call your local emergency services (911 in the [place] Do not drive yourself to the hospital. Summary If a person has gastroesophageal reflux disease (GERD), food and stomach acid move back up into the esophagus and cause symptoms or problems such as damage to the esophagus. Treatment will depend on how bad your symptoms are. Follow a diet as told by your doctor. Take all medicines only as told by your doctor. This information is not intended to replace advice given to you by your health care provider. Make sure you discuss any questions you have with your healthcare provider. Document Revised: 01/24/2020 Document Reviewed: 01/24/2020 Elsevier Patient Education  [place]. High-Fiber Eating Plan Fiber, also called dietary fiber, is a type of carbohydrate. It is found foods such as fruits, vegetables, whole grains, and beans. A high-fiber diet can have many health benefits. Your health care provider may recommend a high-fiber diet to help: Prevent constipation. Fiber can make your bowel movements more regular. Lower your cholesterol. Relieve the following conditions: Inflammation  of veins in the anus (hemorrhoids). Inflammation of specific areas of the digestive tract (uncomplicated diverticulosis). A problem of the large intestine, also called the colon, that sometimes causes pain and diarrhea (irritable bowel syndrome, or IBS). Prevent overeating as part of a weight-loss plan. Prevent heart disease, type 2 diabetes, and certain cancers. What are tips for following this plan? Reading food labels  Check the nutrition facts label on food products for the amount of dietary fiber. Choose foods that have 5 grams of fiber or more per serving. The goals for recommended daily fiber intake include: Men (age 84 or younger): 34-38 g. Men (over age 34): 28-34 g. Women (age 11 or younger): 25-28 g. Women (over age 11): 22-25 g. Your daily fiber goal is _____________ g. Shopping Choose whole fruits and vegetables instead of processed forms, such as apple juice or applesauce. Choose a wide variety of high-fiber foods such as avocados, lentils, oats, and kidney beans. Read the nutrition facts label of the foods you choose. Be aware of foods with added fiber. These foods often have high sugar and sodium amounts per serving. Cooking Use whole-grain flour for baking and cooking. Cook with brown rice instead of white rice. Meal planning Start the day with a breakfast that is high in fiber, such as a cereal that contains 5 g of fiber or more per serving.  Eat breads and cereals that are made with whole-grain flour instead of refined flour or white flour. Eat brown rice, bulgur wheat, or millet instead of white rice. Use beans in place of meat in soups, salads, and pasta dishes. Be sure that half of the grains you eat each day are whole grains. General information You can get the recommended daily intake of dietary fiber by: Eating a variety of fruits, vegetables, grains, nuts, and beans. Taking a fiber supplement if you are not able to take in enough fiber in your diet. It is  better to get fiber through food than from a supplement. Gradually increase how much fiber you consume. If you increase your intake of dietary fiber too quickly, you may have bloating, cramping, or gas. Drink plenty of water to help you digest fiber. Choose high-fiber snacks, such as berries, raw vegetables, nuts, and popcorn. What foods should I eat? Fruits Berries. Pears. Apples. Oranges. Avocado. Prunes and raisins. Dried figs. Vegetables Sweet potatoes. Spinach. Kale. Artichokes. Cabbage. Broccoli. Cauliflower.Green peas. Carrots. Squash. Grains Whole-grain breads. Multigrain cereal. Oats and oatmeal. Brown rice. Barley.Bulgur wheat. [place]. Quinoa. Bran muffins. Popcorn. Rye wafer crackers. Meats and other proteins Navy beans, kidney beans, and pinto beans. Soybeans. Split peas. Lentils. Nutsand seeds. Dairy Fiber-fortified yogurt. Beverages Fiber-fortified soy milk. Fiber-fortified orange juice. Other foods Fiber bars. The items listed above may not be a complete list of recommended foods and beverages. Contact a dietitian for more information. What foods should I avoid? Fruits Fruit juice. Cooked, strained fruit. Vegetables Fried potatoes. Canned vegetables. Well-cooked vegetables. Grains White bread. Pasta made with refined flour. White rice. Meats and other proteins Fatty cuts of meat. Fried chicken or fried fish. Dairy Milk. Yogurt. Cream cheese. Sour cream. Fats and oils Butters. Beverages Soft drinks. Other foods Cakes and pastries. The items listed above may not be a complete list of foods and beverages to avoid. Talk with your dietitian about what choices are best for you. Summary Fiber is a type of carbohydrate. It is found in foods such as fruits, vegetables, whole grains, and beans. A high-fiber diet has many benefits. It can help to prevent constipation, lower blood cholesterol, aid weight loss, and reduce your risk of heart disease, diabetes, and certain  cancers. Increase your intake of fiber gradually. Increasing fiber too quickly may cause cramping, bloating, and gas. Drink plenty of water while you increase the amount of fiber you consume. The best sources of fiber include whole fruits and vegetables, whole grains, nuts, seeds, and beans. This information is not intended to replace advice given to you by your health care provider. Make sure you discuss any questions you have with your healthcare provider. Document Revised: 11/18/2019 Document Reviewed: 11/18/2019 Elsevier Patient Education  2022 [employer].
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
## 2021-02-06 ENCOUNTER — Other Ambulatory Visit: Payer: Self-pay

## 2021-02-06 MED ORDER — NA SULFATE-K SULFATE-MG SULF 17.5-3.13-1.6 GM/177ML PO SOLN: 354.0000 mL | Freq: Once | ORAL | 0 refills | Status: AC

## 2021-02-06 NOTE — Progress Notes (Signed)
Patient states she took the prep yesterday and wanted to know what she needed to do today. Informed patient she needed to take magnesium citrate yesterday and prep today. Resent prep to the pharmacy and she will go pick it up

## 2021-02-12 ENCOUNTER — Telehealth: Payer: Self-pay

## 2021-02-12 ENCOUNTER — Ambulatory Visit: Admission: RE | Admit: 2021-02-12 | Payer: Medicaid Other | Source: Home / Self Care | Admitting: Gastroenterology

## 2021-02-12 ENCOUNTER — Encounter: Admission: RE | Payer: Self-pay | Source: Home / Self Care

## 2021-02-12 SURGERY — COLONOSCOPY WITH PROPOFOL
Anesthesia: General

## 2021-02-12 NOTE — Telephone Encounter (Signed)
[name] called to inform patient what time she needed to come for the procedure on 02/13/2021 and the caregiver/driver got on the phone and states she is a bus driver and can not bring patient till 9:00am but has to be done by 11:30am. [name] states she can not promise that. Called patient this morning and she states if this can not be done she will cancel the procedure. Patient cancel the procedure informed [name].

## 2021-04-30 ENCOUNTER — Other Ambulatory Visit: Payer: Self-pay

## 2021-05-01 ENCOUNTER — Ambulatory Visit: Payer: Medicaid Other | Admitting: Gastroenterology

## 2021-05-01 ENCOUNTER — Encounter: Payer: Self-pay | Admitting: Gastroenterology

## 2021-05-04 ENCOUNTER — Encounter: Payer: Self-pay | Admitting: Emergency Medicine

## 2021-05-04 ENCOUNTER — Emergency Department
Admission: EM | Admit: 2021-05-04 | Discharge: 2021-05-04 | Disposition: A | Discharge status: Home / Self Care | Payer: Medicaid Other | Attending: Emergency Medicine | Admitting: Emergency Medicine

## 2021-05-04 ENCOUNTER — Other Ambulatory Visit: Payer: Self-pay

## 2021-05-04 DIAGNOSIS — Z79899 Other long term (current) drug therapy: Secondary | ICD-10-CM | POA: Insufficient documentation

## 2021-05-04 DIAGNOSIS — Z91010 Allergy to peanuts: Secondary | ICD-10-CM | POA: Insufficient documentation

## 2021-05-04 DIAGNOSIS — E119 Type 2 diabetes mellitus without complications: Secondary | ICD-10-CM | POA: Diagnosis not present

## 2021-05-04 DIAGNOSIS — K649 Unspecified hemorrhoids: Secondary | ICD-10-CM | POA: Diagnosis present

## 2021-05-04 DIAGNOSIS — K644 Residual hemorrhoidal skin tags: Secondary | ICD-10-CM | POA: Insufficient documentation

## 2021-05-04 DIAGNOSIS — Z87891 Personal history of nicotine dependence: Secondary | ICD-10-CM | POA: Insufficient documentation

## 2021-05-04 DIAGNOSIS — J45909 Unspecified asthma, uncomplicated: Secondary | ICD-10-CM | POA: Insufficient documentation

## 2021-05-04 DIAGNOSIS — K645 Perianal venous thrombosis: Secondary | ICD-10-CM

## 2021-05-04 DIAGNOSIS — I10 Essential (primary) hypertension: Secondary | ICD-10-CM | POA: Insufficient documentation

## 2021-05-04 MED ORDER — BUPIVACAINE HCL 0.5 % IJ SOLN
50.0000 mL | Freq: Once | INTRAMUSCULAR | Status: AC
  Administered 2021-05-04: 50 mL
  Filled 2021-05-04: qty 50

## 2021-05-04 MED ORDER — LIDOCAINE HCL (PF) 1 % IJ SOLN
5.0000 mL | Freq: Once | INTRAMUSCULAR | Status: AC
  Administered 2021-05-04: 5 mL
  Filled 2021-05-04: qty 5
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
## 2021-05-04 NOTE — ED Triage Notes (Signed)
C/O hemorrhoid pain and bleeding since Monday.

## 2021-05-04 NOTE — Discharge Instructions (Addendum)
Please seek medical attention for any high fevers, chest pain, shortness of breath, change in behavior, persistent vomiting, bloody stool or any other new or concerning symptoms.  

## 2021-08-10 ENCOUNTER — Other Ambulatory Visit: Payer: Self-pay | Admitting: Family Medicine

## 2021-08-10 DIAGNOSIS — G8929 Other chronic pain: Secondary | ICD-10-CM

## 2021-08-16 ENCOUNTER — Ambulatory Visit
Admission: RE | Admit: 2021-08-16 | Discharge: 2021-08-16 | Disposition: A | Discharge status: Home / Self Care | Payer: Medicaid Other | Source: Ambulatory Visit | Attending: Family Medicine | Admitting: Family Medicine

## 2021-08-16 ENCOUNTER — Other Ambulatory Visit: Payer: Self-pay

## 2021-08-16 DIAGNOSIS — R109 Unspecified abdominal pain: Secondary | ICD-10-CM | POA: Insufficient documentation

## 2021-08-16 DIAGNOSIS — G8929 Other chronic pain: Secondary | ICD-10-CM | POA: Diagnosis present

## 2021-08-19 ENCOUNTER — Other Ambulatory Visit: Payer: Self-pay

## 2021-08-19 ENCOUNTER — Emergency Department
Admission: EM | Admit: 2021-08-19 | Discharge: 2021-08-19 | Disposition: A | Discharge status: Home / Self Care | Payer: Medicaid Other | Attending: Emergency Medicine | Admitting: Emergency Medicine

## 2021-08-19 DIAGNOSIS — R1031 Right lower quadrant pain: Secondary | ICD-10-CM | POA: Diagnosis not present

## 2021-08-19 DIAGNOSIS — I10 Essential (primary) hypertension: Secondary | ICD-10-CM | POA: Insufficient documentation

## 2021-08-19 DIAGNOSIS — G8929 Other chronic pain: Secondary | ICD-10-CM | POA: Diagnosis not present

## 2021-08-19 DIAGNOSIS — R112 Nausea with vomiting, unspecified: Secondary | ICD-10-CM | POA: Insufficient documentation

## 2021-08-19 DIAGNOSIS — R1030 Lower abdominal pain, unspecified: Secondary | ICD-10-CM | POA: Diagnosis present

## 2021-08-19 DIAGNOSIS — R1032 Left lower quadrant pain: Secondary | ICD-10-CM | POA: Diagnosis not present

## 2021-08-19 DIAGNOSIS — J45909 Unspecified asthma, uncomplicated: Secondary | ICD-10-CM | POA: Diagnosis not present

## 2021-08-19 DIAGNOSIS — E119 Type 2 diabetes mellitus without complications: Secondary | ICD-10-CM | POA: Insufficient documentation

## 2021-08-19 LAB — CBC WITH DIFFERENTIAL/PLATELET
Abs Immature Granulocytes: 0.02 10*3/uL (ref 0.00–0.07)
Basophils Absolute: 0 10*3/uL (ref 0.0–0.1)
Basophils Relative: 0 %
Eosinophils Absolute: 0.1 10*3/uL (ref 0.0–0.5)
Eosinophils Relative: 1 %
HCT: 37.6 % (ref 36.0–46.0)
Hemoglobin: 12.3 g/dL (ref 12.0–15.0)
Immature Granulocytes: 0 %
Lymphocytes Relative: 17 %
Lymphs Abs: 1.1 10*3/uL (ref 0.7–4.0)
MCH: 27.2 pg (ref 26.0–34.0)
MCHC: 32.7 g/dL (ref 30.0–36.0)
MCV: 83 fL (ref 80.0–100.0)
Monocytes Absolute: 0.4 10*3/uL (ref 0.1–1.0)
Monocytes Relative: 7 %
Neutro Abs: 4.7 10*3/uL (ref 1.7–7.7)
Neutrophils Relative %: 75 %
Platelets: 289 10*3/uL (ref 150–400)
RBC: 4.53 MIL/uL (ref 3.87–5.11)
RDW: 13.6 % (ref 11.5–15.5)
WBC: 6.3 10*3/uL (ref 4.0–10.5)
nRBC: 0 % (ref 0.0–0.2)

## 2021-08-19 LAB — URINALYSIS, ROUTINE W REFLEX MICROSCOPIC
Bilirubin Urine: NEGATIVE
Glucose, UA: NEGATIVE mg/dL
Ketones, ur: NEGATIVE mg/dL
Leukocytes,Ua: NEGATIVE
Nitrite: NEGATIVE
Protein, ur: NEGATIVE mg/dL
Specific Gravity, Urine: 1.023 (ref 1.005–1.030)
pH: 5 (ref 5.0–8.0)

## 2021-08-19 LAB — COMPREHENSIVE METABOLIC PANEL
ALT: 18 U/L (ref 0–44)
AST: 19 U/L (ref 15–41)
Albumin: 3.6 g/dL (ref 3.5–5.0)
Alkaline Phosphatase: 43 U/L (ref 38–126)
Anion gap: 7 (ref 5–15)
BUN: 9 mg/dL (ref 6–20)
CO2: 23 mmol/L (ref 22–32)
Calcium: 9 mg/dL (ref 8.9–10.3)
Chloride: 107 mmol/L (ref 98–111)
Creatinine, Ser: 0.62 mg/dL (ref 0.44–1.00)
GFR, Estimated: >60 mL/min (ref >60)
Glucose, Bld: 102 mg/dL — ABNORMAL HIGH (ref 70–99)
Potassium: 3.5 mmol/L (ref 3.5–5.1)
Sodium: 137 mmol/L (ref 135–145)
Total Bilirubin: 0.6 mg/dL (ref 0.3–1.2)
Total Protein: 6.9 g/dL (ref 6.5–8.1)

## 2021-08-19 LAB — LIPASE, BLOOD: Lipase: 33 U/L (ref 11–51)

## 2021-08-19 MED ORDER — ONDANSETRON 4 MG PO TBDP: 4.0000 mg | ORAL_TABLET | Freq: Three times a day (TID) | ORAL | 0 refills | Status: DC | PRN

## 2021-08-19 MED ORDER — LACTATED RINGERS IV BOLUS
1000.0000 mL | Freq: Once | INTRAVENOUS | Status: AC
  Administered 2021-08-19: 1000 mL via INTRAVENOUS

## 2021-08-19 MED ORDER — HYDROCODONE-ACETAMINOPHEN 5-325 MG PO TABS: 1.0000 | ORAL_TABLET | ORAL | 0 refills | Status: DC | PRN

## 2021-08-19 MED ORDER — SODIUM CHLORIDE 0.9 % IV SOLN
12.5000 mg | Freq: Once | INTRAVENOUS | Status: AC
  Administered 2021-08-19: 12.5 mg via INTRAVENOUS
  Filled 2021-08-19: qty 12.5

## 2021-08-19 MED ORDER — ONDANSETRON HCL 4 MG/2ML IJ SOLN: 4.0000 mg | Freq: Once | INTRAMUSCULAR | Status: DC

## 2021-08-19 MED ORDER — MORPHINE SULFATE (PF) 4 MG/ML IV SOLN
4.0000 mg | Freq: Once | INTRAVENOUS | Status: AC
  Administered 2021-08-19: 4 mg via INTRAVENOUS
  Filled 2021-08-19: qty 1

## 2021-08-19 NOTE — ED Provider Notes (Signed)
[facility] Provider Note    Event Date/Time   First MD Initiated Contact with Patient 08/19/21 1418     (approximate)   History   Chief Complaint Abdominal Pain   HPI  Patricia [name] is a 51 y.o. female with past medical history of hypertension, hyperlipidemia, diabetes, asthma, bipolar disorder, and schizophrenia presents to the ED complaining of abdominal pain.  Patient reports that she has been dealing with greater than 2 months of pain in her lower abdomen.  She describes the pain as sharp and constant, not exacerbated or alleviated by anything in particular.  She reports associated nausea and intermittent vomiting, denies any changes in her bowel movements.  She has not noticed any blood in her emesis or stool, denies any dysuria or hematuria.  She denies any fevers, cough, chest pain, or shortness of breath.  She reports being seen by her PCP for this problem last week with unremarkable ultrasound performed at that time.  She was prescribed Bentyl and has been taking this without relief of her pain.  She reports being seen by GI in the past but states she has never had a colonoscopy.     Physical Exam   Triage Vital Signs: ED Triage Vitals  Enc Vitals Group     BP 08/19/21 1404 (!) 159/108     Pulse Rate 08/19/21 1404 84     Resp 08/19/21 1404 19     Temp 08/19/21 1404 98.9 F (37.2 C)     Temp src --      SpO2 08/19/21 1404 95 %     Weight --      Height --      Head Circumference --      Peak Flow --      Pain Score 08/19/21 1405 10     Pain Loc --      Pain Edu? --      Excl. in [place]? --     Most recent vital signs: Vitals:   08/19/21 1530 08/19/21 1600  BP: (!) 159/108 (!) 155/103  Pulse: 77 75  Resp:    Temp:    SpO2:      Constitutional: Alert and oriented. Eyes: Conjunctivae are normal. Head: Atraumatic. Nose: No congestion/rhinnorhea. Mouth/Throat: Mucous membranes are moist.  Cardiovascular: Normal rate, regular  rhythm. Grossly normal heart sounds.  2+ radial pulses bilaterally. Respiratory: Normal respiratory effort.  No retractions. Lungs CTAB. Gastrointestinal: Soft and tender to palpation in the bilateral lower quadrants with no rebound or guarding.  No CVA tenderness bilaterally. No distention. Musculoskeletal: No lower extremity tenderness nor edema.  Neurologic:  Normal speech and language. No gross focal neurologic deficits are appreciated.    ED Results / Procedures / Treatments   Labs (all labs ordered are listed, but only abnormal results are displayed) Labs Reviewed  COMPREHENSIVE METABOLIC PANEL - Abnormal; Notable for the following components:      Result Value   Glucose, Bld 102 (*)    All other components within normal limits  URINALYSIS, ROUTINE W REFLEX MICROSCOPIC - Abnormal; Notable for the following components:   Color, Urine YELLOW (*)    APPearance CLOUDY (*)    Hgb urine dipstick SMALL (*)    Bacteria, UA FEW (*)    All other components within normal limits  CBC WITH DIFFERENTIAL/PLATELET  LIPASE, BLOOD     EKG  ED ECG REPORT I, Blake Divine, the attending physician, personally viewed and interpreted this ECG.  Date: 08/19/2021  EKG Time: 15:06  Rate: 67  Rhythm: normal sinus rhythm  Axis: Normal  Intervals:none  ST&T Change: None   PROCEDURES:  Critical Care performed: No  Procedures   MEDICATIONS ORDERED IN ED: Medications  lactated ringers bolus 1,000 mL (1,000 mLs Intravenous New Bag/Given 08/19/21 1511)  morphine 4 MG/ML injection 4 mg (4 mg Intravenous Given 08/19/21 1514)  promethazine (PHENERGAN) 12.5 mg in sodium chloride 0.9 % 50 mL IVPB (0 mg Intravenous Stopped 08/19/21 1533)     IMPRESSION / MDM / ASSESSMENT AND PLAN / ED COURSE  I reviewed the triage vital signs and the nursing notes.                              51 y.o. female with past medical history of hypertension, hyperlipidemia, diabetes, asthma, bowel obstruction,  bipolar disorder, and schizophrenia who presents to the ED complaining of greater than 2 months of constant pain in the bilateral lower quadrants of her abdomen associate with nausea and vomiting.  Differential diagnosis includes, but is not limited to, gastritis, pancreatitis, cholecystitis, biliary colic, cystitis, appendicitis, diverticulitis, and GERD.  Patient is nontoxic-appearing and in no acute distress, vital signs are reassuring.  She has mild tenderness to palpation in the bilateral lower quadrants of her abdomen but given chronicity of symptoms I have a low suspicion for appendicitis, diverticulitis, or cholecystitis.  We will screen labs including CBC, CMP, and lipase.  Also plan to check UA, patient is status post hysterectomy.  Low suspicion for bowel obstruction given chronicity of symptoms.  Patient appears to deal with chronic abdominal pain with multiple unremarkable CT scans in the past and unremarkable ultrasound last week.  We will treat symptomatically with IV morphine and Phenergan, reassess after lab results.  Labs are unremarkable, CBC shows no anemia or leukocytosis, CMP with no electrolyte abnormality or abnormal LFTs, lipase within normal limits.  UA shows no signs of infection.  No apparent emergent etiology for patient's symptoms and she is appropriate for outpatient follow-up, provided with referral to see GI.  She was prescribed short course of pain and nausea medication, counseled to return to the ED for new worsening symptoms.  Patient agrees with plan.       FINAL CLINICAL IMPRESSION(S) / ED DIAGNOSES   Final diagnoses:  Chronic abdominal pain     Rx / DC Orders   ED Discharge Orders          Ordered    ondansetron (ZOFRAN-ODT) 4 MG disintegrating tablet  Every 8 hours PRN        08/19/21 1645    HYDROcodone-acetaminophen (NORCO/VICODIN) 5-325 MG tablet  Every 4 hours PRN        08/19/21 1645             Note:  This document was prepared using  Dragon voice recognition software and may include unintentional dictation errors.   Blake Divine, MD 08/19/21 [id number]

## 2021-08-19 NOTE — ED Triage Notes (Addendum)
Pt in via EMS from home with c/o abd pain. Pt reports has had ongoing issues with this and her MD prescribed her new meds Friday but it has not helped and she cannot take the pain. Pt was given 4mg  of zofran to right deltoid by EMS en route

## 2021-08-19 NOTE — ED Notes (Signed)
Patient given TV remote as requested. Patient is speaking on the phone without difficulty. Patient denies pain relief after morphine administration, but reports relief of nausea after Phenergan administration. Patient states Dilaudid usually controls her pain better. Dr. Charna Archer aware.

## 2021-08-19 NOTE — ED Notes (Signed)
Patient ambulated to hallway bathroom and back. Patient c/o abdominal pain. Dr. Charna Archer aware. Patient is watching TV at this time.

## 2021-09-24 ENCOUNTER — Other Ambulatory Visit: Payer: Self-pay

## 2021-09-24 ENCOUNTER — Ambulatory Visit: Payer: Medicaid Other | Admitting: Gastroenterology

## 2021-11-06 ENCOUNTER — Ambulatory Visit (HOSPITAL_COMMUNITY)
Admission: EM | Admit: 2021-11-06 | Discharge: 2021-11-06 | Disposition: A | Discharge status: Home / Self Care | Payer: Medicaid Other | Attending: Emergency Medicine | Admitting: Emergency Medicine

## 2021-11-06 ENCOUNTER — Ambulatory Visit (INDEPENDENT_AMBULATORY_CARE_PROVIDER_SITE_OTHER): Payer: Medicaid Other

## 2021-11-06 ENCOUNTER — Encounter (HOSPITAL_COMMUNITY): Payer: Self-pay | Admitting: Emergency Medicine

## 2021-11-06 DIAGNOSIS — M79641 Pain in right hand: Secondary | ICD-10-CM

## 2021-11-06 MED ORDER — CYCLOBENZAPRINE HCL 10 MG PO TABS: 10.0000 mg | ORAL_TABLET | Freq: Two times a day (BID) | ORAL | 0 refills | Status: DC | PRN

## 2021-11-06 MED ORDER — NAPROXEN 500 MG PO TABS: 500.0000 mg | ORAL_TABLET | Freq: Two times a day (BID) | ORAL | 0 refills | Status: DC

## 2021-11-06 MED ORDER — KETOROLAC TROMETHAMINE 30 MG/ML IJ SOLN
30.0000 mg | Freq: Once | INTRAMUSCULAR | Status: AC
  Administered 2021-11-06: 30 mg via INTRAMUSCULAR

## 2021-11-06 MED ORDER — KETOROLAC TROMETHAMINE 30 MG/ML IJ SOLN
INTRAMUSCULAR | Status: AC
  Filled 2021-11-06: qty 1

## 2021-11-06 NOTE — ED Triage Notes (Signed)
Pt is present today with right hand pain Pt describes the pain as being sharp. Pt sx started x2 weeks ago  ?

## 2021-11-06 NOTE — Discharge Instructions (Signed)
Your x-ray today did not show injury to the bone or joints of your hand . Your pain is most likely being caused by irritation to the soft tissues, this should improve as time progresses.  ? ?Take naproxen twice daily for the next 5 to 7 days then you may use as needed, this medicine is to reduce the irritation ? ?You may use muscle relaxer twice daily for additional comfort, be mindful this medication may make you drowsy, if this occurs you may use at bedtime ? ?You may apply heat or ice, whichever makes you feel better, to affected area in 15 minute intervals ? ?You may continue activity as tolerated, there is no injury therefore, it is important that you continue to move around so you do not loose strength to the area ? ?May use hand brace as needed for additional support ? ?If symptoms persist past 2 weeks, you may follow up at urgent care or with orthopedic specialist for evaluation, an orthopedic doctor specializes in the bone, they may provide  management such as but not limited to imaging, long term medications and physical therapy  ?
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
## 2021-11-15 ENCOUNTER — Encounter: Payer: Self-pay | Admitting: Family Medicine

## 2021-11-15 ENCOUNTER — Ambulatory Visit (INDEPENDENT_AMBULATORY_CARE_PROVIDER_SITE_OTHER): Payer: Medicaid Other | Admitting: Family Medicine

## 2021-11-15 VITALS — BP 132/90 | HR 75 | Temp 96.9°F | Ht 64.0 in | Wt 235.4 lb

## 2021-11-15 DIAGNOSIS — K439 Ventral hernia without obstruction or gangrene: Secondary | ICD-10-CM | POA: Diagnosis not present

## 2021-11-15 DIAGNOSIS — E119 Type 2 diabetes mellitus without complications: Secondary | ICD-10-CM

## 2021-11-15 DIAGNOSIS — G4700 Insomnia, unspecified: Secondary | ICD-10-CM | POA: Insufficient documentation

## 2021-11-15 DIAGNOSIS — J302 Other seasonal allergic rhinitis: Secondary | ICD-10-CM

## 2021-11-15 DIAGNOSIS — K219 Gastro-esophageal reflux disease without esophagitis: Secondary | ICD-10-CM

## 2021-11-15 DIAGNOSIS — I10 Essential (primary) hypertension: Secondary | ICD-10-CM | POA: Diagnosis not present

## 2021-11-15 DIAGNOSIS — J45909 Unspecified asthma, uncomplicated: Secondary | ICD-10-CM

## 2021-11-15 DIAGNOSIS — E1169 Type 2 diabetes mellitus with other specified complication: Secondary | ICD-10-CM

## 2021-11-15 DIAGNOSIS — F3130 Bipolar disorder, current episode depressed, mild or moderate severity, unspecified: Secondary | ICD-10-CM

## 2021-11-15 DIAGNOSIS — G5601 Carpal tunnel syndrome, right upper limb: Secondary | ICD-10-CM

## 2021-11-15 DIAGNOSIS — R1032 Left lower quadrant pain: Secondary | ICD-10-CM

## 2021-11-15 DIAGNOSIS — R1031 Right lower quadrant pain: Secondary | ICD-10-CM

## 2021-11-15 DIAGNOSIS — E669 Obesity, unspecified: Secondary | ICD-10-CM | POA: Diagnosis not present

## 2021-11-15 DIAGNOSIS — M545 Low back pain, unspecified: Secondary | ICD-10-CM

## 2021-11-15 DIAGNOSIS — G8929 Other chronic pain: Secondary | ICD-10-CM

## 2021-11-15 DIAGNOSIS — F200 Paranoid schizophrenia: Secondary | ICD-10-CM

## 2021-11-15 MED ORDER — CYCLOBENZAPRINE HCL 10 MG PO TABS: 10.0000 mg | ORAL_TABLET | Freq: Two times a day (BID) | ORAL | 0 refills | Status: DC | PRN

## 2021-11-15 MED ORDER — DICYCLOMINE HCL 20 MG PO TABS: 20.0000 mg | ORAL_TABLET | Freq: Four times a day (QID) | ORAL | 3 refills | Status: DC | PRN

## 2021-11-15 MED ORDER — MELOXICAM 15 MG PO TABS: 15.0000 mg | ORAL_TABLET | Freq: Every day | ORAL | 0 refills | Status: DC

## 2021-11-15 MED ORDER — HYDROXYZINE HCL 25 MG PO TABS: 25.0000 mg | ORAL_TABLET | Freq: Three times a day (TID) | ORAL | 3 refills | Status: DC | PRN

## 2021-11-15 MED ORDER — PANTOPRAZOLE SODIUM 40 MG PO TBEC: 40.0000 mg | DELAYED_RELEASE_TABLET | Freq: Every day | ORAL | 2 refills | Status: DC

## 2021-11-15 MED ORDER — ALBUTEROL SULFATE HFA 108 (90 BASE) MCG/ACT IN AERS: 2.0000 | INHALATION_SPRAY | Freq: Four times a day (QID) | RESPIRATORY_TRACT | 0 refills | Status: DC | PRN

## 2021-11-15 MED ORDER — TRAZODONE HCL 100 MG PO TABS: 100.0000 mg | ORAL_TABLET | Freq: Every day | ORAL | 2 refills | Status: DC

## 2021-11-15 MED ORDER — AMLODIPINE BESYLATE 10 MG PO TABS: 10.0000 mg | ORAL_TABLET | Freq: Every day | ORAL | 2 refills | Status: DC

## 2021-11-15 MED ORDER — HYDROCHLOROTHIAZIDE 25 MG PO TABS: 25.0000 mg | ORAL_TABLET | Freq: Every day | ORAL | 2 refills | Status: DC

## 2021-11-15 MED ORDER — WRIST BRACE//LEFT LARGE MISC: 1.0000 | Freq: Every day | 0 refills | Status: DC

## 2021-11-15 NOTE — Assessment & Plan Note (Signed)
Mood stable at moment ?Patient to discuss further as needed. ?

## 2021-11-15 NOTE — Assessment & Plan Note (Signed)
Chronic, stable ?Refill trazodone 100 mg ?

## 2021-11-15 NOTE — Assessment & Plan Note (Signed)
No endorsed hallucinations, mood appears stable ?Continue to monitor ?
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
## 2021-11-15 NOTE — Assessment & Plan Note (Signed)
Chronic with abdominal pain with imaging in 11/2020 showing right ventral hernia ?Benign abdominal exam, no vomiting or diarrhea ?Refer to general surgery ?

## 2021-11-15 NOTE — Assessment & Plan Note (Signed)
Continue bracing at home and NSAIDs. ?

## 2021-11-15 NOTE — Assessment & Plan Note (Signed)
Chronic, stable refill albuterol inhaler for as needed use ?

## 2021-11-15 NOTE — Assessment & Plan Note (Signed)
Recommend diet and exercise ?Restratification labs as above ?

## 2021-11-15 NOTE — Addendum Note (Signed)
Addended by: [name] on: 11/15/2021 04:21 PM ? ? Modules accepted: Orders ? ?

## 2021-11-15 NOTE — Patient Instructions (Signed)
We refilled her medications as requested. ?Take meloxicam as needed for back pain and carpal tunnel. ?We are referring to general surgery for discussion about abdominal pain and hernia. ?We are checking blood work to check on diabetes, cholesterol, kidney function. ?

## 2021-11-15 NOTE — Assessment & Plan Note (Signed)
Likely induced by antipsychotic ?Check CMP, hemoglobin A1c, lipid panel for restratification ?

## 2021-11-16 LAB — LIPID PANEL
Cholesterol: 212 mg/dL — ABNORMAL HIGH (ref 0–200)
HDL: 61.9 mg/dL (ref >39.00)
LDL Cholesterol: 137 mg/dL — ABNORMAL HIGH (ref 0–99)
NonHDL: 150.31
Total CHOL/HDL Ratio: 3
Triglycerides: 68 mg/dL (ref 0.0–149.0)
VLDL: 13.6 mg/dL (ref 0.0–40.0)

## 2021-11-16 LAB — COMPREHENSIVE METABOLIC PANEL
ALT: 11 U/L (ref 0–35)
AST: 14 U/L (ref 0–37)
Albumin: 4.1 g/dL (ref 3.5–5.2)
Alkaline Phosphatase: 42 U/L (ref 39–117)
BUN: 11 mg/dL (ref 6–23)
CO2: 24 mEq/L (ref 19–32)
Calcium: 9.1 mg/dL (ref 8.4–10.5)
Chloride: 109 mEq/L (ref 96–112)
Creatinine, Ser: 0.81 mg/dL (ref 0.40–1.20)
GFR: 83.84 mL/min (ref >60.00)
Glucose, Bld: 86 mg/dL (ref 70–99)
Potassium: 3.8 mEq/L (ref 3.5–5.1)
Sodium: 142 mEq/L (ref 135–145)
Total Bilirubin: 0.3 mg/dL (ref 0.2–1.2)
Total Protein: 7 g/dL (ref 6.0–8.3)

## 2021-11-16 LAB — HEMOGLOBIN A1C: Hgb A1c MFr Bld: 6.5 % (ref 4.6–6.5)

## 2021-11-19 ENCOUNTER — Telehealth: Payer: Self-pay | Admitting: Family Medicine

## 2021-11-19 MED ORDER — HYDROXYZINE HCL 25 MG PO TABS: 25.0000 mg | ORAL_TABLET | Freq: Three times a day (TID) | ORAL | 3 refills | Status: AC | PRN

## 2021-11-19 MED ORDER — DICYCLOMINE HCL 20 MG PO TABS: 20.0000 mg | ORAL_TABLET | Freq: Three times a day (TID) | ORAL | 3 refills | Status: DC

## 2021-11-19 MED ORDER — ALBUTEROL SULFATE HFA 108 (90 BASE) MCG/ACT IN AERS: 2.0000 | INHALATION_SPRAY | Freq: Four times a day (QID) | RESPIRATORY_TRACT | 0 refills | Status: AC | PRN

## 2021-11-19 MED ORDER — PANTOPRAZOLE SODIUM 40 MG PO TBEC: 40.0000 mg | DELAYED_RELEASE_TABLET | Freq: Every day | ORAL | 2 refills | Status: DC

## 2021-11-19 MED ORDER — WRIST BRACE//LEFT LARGE MISC: 1.0000 | Freq: Every day | 0 refills | Status: AC

## 2021-11-19 MED ORDER — MELOXICAM 15 MG PO TABS: 15.0000 mg | ORAL_TABLET | Freq: Every day | ORAL | 0 refills | Status: DC

## 2021-11-19 MED ORDER — AMLODIPINE BESYLATE 10 MG PO TABS: 10.0000 mg | ORAL_TABLET | Freq: Every day | ORAL | 2 refills | Status: DC

## 2021-11-19 MED ORDER — TRAZODONE HCL 100 MG PO TABS: 100.0000 mg | ORAL_TABLET | Freq: Every day | ORAL | 2 refills | Status: DC

## 2021-11-19 MED ORDER — HYDROCHLOROTHIAZIDE 25 MG PO TABS: 25.0000 mg | ORAL_TABLET | Freq: Every day | ORAL | 2 refills | Status: DC

## 2021-11-19 MED ORDER — CYCLOBENZAPRINE HCL 10 MG PO TABS: 10.0000 mg | ORAL_TABLET | Freq: Every day | ORAL | 0 refills | Status: DC

## 2021-11-19 NOTE — Telephone Encounter (Signed)
Pharmacy has been called prescription has been sent. ?

## 2021-11-19 NOTE — Telephone Encounter (Signed)
Please call pharmacy to see if they went through, these were sent erx at last visit.

## 2021-11-19 NOTE — Telephone Encounter (Signed)
Pt is needing all 10 prescriptions from 11/15/21 sent over to [facility] Address: [address], [place], [place] [address]  ?Phone: [phone] ?

## 2021-11-19 NOTE — Addendum Note (Signed)
Addended by: [name] on: 11/19/2021 03:23 PM ? ? Modules accepted: Orders ? ?

## 2021-11-19 NOTE — Telephone Encounter (Signed)
Spoke with PT set a virtual appt 11/20/2021 ?

## 2021-11-19 NOTE — Telephone Encounter (Signed)
We can do a virtual. I just need to ask a few questions that we didn't address during her last appointment.

## 2021-11-19 NOTE — Telephone Encounter (Signed)
Pt is wondering if she can have a virtual visit or does it have to be an OV with Dr. Olivia Mackie to get paperwork filled out. Please advise pt at [phone] ?

## 2021-11-20 ENCOUNTER — Ambulatory Visit (INDEPENDENT_AMBULATORY_CARE_PROVIDER_SITE_OTHER): Payer: Medicaid Other | Admitting: Family Medicine

## 2021-11-20 VITALS — Wt 235.0 lb

## 2021-11-20 DIAGNOSIS — Z9989 Dependence on other enabling machines and devices: Secondary | ICD-10-CM | POA: Diagnosis not present

## 2021-11-20 DIAGNOSIS — G8929 Other chronic pain: Secondary | ICD-10-CM

## 2021-11-20 DIAGNOSIS — Z7409 Other reduced mobility: Secondary | ICD-10-CM | POA: Diagnosis not present

## 2021-11-20 DIAGNOSIS — M545 Low back pain, unspecified: Secondary | ICD-10-CM | POA: Diagnosis not present

## 2021-11-20 NOTE — Progress Notes (Signed)
Virtual Visit via Telephone Note ? ?I connected with Tye [place] on 11/20/21 at  9:00 AM EDT by telephone and verified that I am speaking with the correct person using two identifiers. ? ?Location: ?Patient: home ?Provider: office ?  ?I discussed the limitations, risks, security and privacy concerns of performing an evaluation and management service by telephone and the availability of in person appointments. I also discussed with the patient that there may be a patient responsible charge related to this service. The patient expressed understanding and agreed to proceed. ? ? ?History of Present Illness: ?Patient wanting to discuss recertification for ongoing personal care service provider.  She has a current Optician, dispensing.  Patient with lower back pain and limited mobility due to this.  Has personal care assistant that helps with cooking, bathing, ambulation/mobility, and doing particular errands like driving.  Patient is at lower back pain for several years of worsening progression.  Also uses a cane for ambulation ?  ?Observations/Objective: ?General: No acute distress ?Respiratory: No respiratory distress ? ?Assessment and Plan: ?Limited mobility with chronic back pain ?Patient needs assistant to assist with ADLs ?DME Cane ordered ? ?Follow Up Instructions: ?As needed ?  ?I discussed the assessment and treatment plan with the patient. The patient was provided an opportunity to ask questions and all were answered. The patient agreed with the plan and demonstrated an understanding of the instructions. ?  ?The patient was advised to call back or seek an in-person evaluation if the symptoms worsen or if the condition fails to improve as anticipated. ? ?I provided 12 minutes of non-face-to-face time during this encounter. ? ? ?[name], MD ? ?

## 2021-11-22 ENCOUNTER — Other Ambulatory Visit: Payer: Self-pay | Admitting: Family Medicine

## 2021-11-22 NOTE — Addendum Note (Signed)
Addended by: Josephine Igo B on: 11/22/2021 12:23 PM ? ? Modules accepted: Level of Service ? ?

## 2021-12-04 ENCOUNTER — Telehealth: Payer: Self-pay | Admitting: Family Medicine

## 2021-12-04 NOTE — Telephone Encounter (Signed)
Pt is saying that [facility] paperwork is needing her Medicaid ID# added to her paperwork. She says it was faxed back over to [place]. ?

## 2021-12-05 NOTE — Telephone Encounter (Signed)
Forms complete and faxed

## 2021-12-10 ENCOUNTER — Telehealth: Payer: Self-pay | Admitting: Family Medicine

## 2021-12-10 NOTE — Telephone Encounter (Signed)
Pt called in stating she hasn't received call about getting a cane. Please call her back about the order and where it was sent to/who to call. ? ?[facility] ?

## 2021-12-10 NOTE — Telephone Encounter (Signed)
Resent fax with MDI# ?

## 2021-12-11 ENCOUNTER — Other Ambulatory Visit: Payer: Self-pay | Admitting: Family Medicine

## 2021-12-11 DIAGNOSIS — Z7409 Other reduced mobility: Secondary | ICD-10-CM

## 2021-12-11 NOTE — Telephone Encounter (Signed)
Spoke with Pt complaining of abdominal pain and nausea due to hernia. Advise by the provider to go to ED due to server pain. Pt states that she will take nausea meds and muscle relaxer and if no improvement she will go to the ED. ?

## 2021-12-17 ENCOUNTER — Other Ambulatory Visit: Payer: Self-pay

## 2021-12-17 DIAGNOSIS — Z7409 Other reduced mobility: Secondary | ICD-10-CM

## 2021-12-17 NOTE — Telephone Encounter (Signed)
Pt is checking status of this message concerning her cane being ordered. Please advise pt at [phone]

## 2021-12-18 NOTE — Telephone Encounter (Signed)
Faxed order to [place].

## 2021-12-19 ENCOUNTER — Telehealth: Payer: Self-pay | Admitting: Family Medicine

## 2021-12-19 NOTE — Telephone Encounter (Signed)
Pt stated she sent  a form to be filled out and she needs some changes on it. It was faxed back She asked if it could be done asap. She needs more nurse care. She also has not received her cane and it has been awhile since this was ordered. Please advise.

## 2022-01-09 ENCOUNTER — Other Ambulatory Visit: Payer: Self-pay | Admitting: Surgery

## 2022-01-09 ENCOUNTER — Telehealth: Payer: Self-pay | Admitting: Family Medicine

## 2022-01-09 DIAGNOSIS — R112 Nausea with vomiting, unspecified: Secondary | ICD-10-CM

## 2022-01-09 NOTE — Telephone Encounter (Signed)
Contacted Felicia and she stated that she was calling for the patient's phone number , but she found it in her records.

## 2022-01-09 NOTE — Telephone Encounter (Signed)
Solmon Ice from [facility] [phone] called about this pt. I apologize  but I can't remember the context. So sorry.

## 2022-01-14 ENCOUNTER — Encounter (HOSPITAL_COMMUNITY): Payer: Self-pay

## 2022-01-14 ENCOUNTER — Emergency Department (HOSPITAL_COMMUNITY)
Admission: EM | Admit: 2022-01-14 | Discharge: 2022-01-15 | Disposition: A | Discharge status: Home / Self Care | Payer: Medicaid Other | Attending: Emergency Medicine | Admitting: Emergency Medicine

## 2022-01-14 DIAGNOSIS — J45909 Unspecified asthma, uncomplicated: Secondary | ICD-10-CM | POA: Insufficient documentation

## 2022-01-14 DIAGNOSIS — Z79899 Other long term (current) drug therapy: Secondary | ICD-10-CM | POA: Insufficient documentation

## 2022-01-14 DIAGNOSIS — R112 Nausea with vomiting, unspecified: Secondary | ICD-10-CM | POA: Diagnosis not present

## 2022-01-14 DIAGNOSIS — I10 Essential (primary) hypertension: Secondary | ICD-10-CM | POA: Insufficient documentation

## 2022-01-14 DIAGNOSIS — Z91010 Allergy to peanuts: Secondary | ICD-10-CM | POA: Insufficient documentation

## 2022-01-14 DIAGNOSIS — E119 Type 2 diabetes mellitus without complications: Secondary | ICD-10-CM | POA: Insufficient documentation

## 2022-01-14 DIAGNOSIS — R519 Headache, unspecified: Secondary | ICD-10-CM | POA: Diagnosis present

## 2022-01-14 MED ORDER — ONDANSETRON 4 MG PO TBDP
4.0000 mg | ORAL_TABLET | Freq: Once | ORAL | Status: AC
Start: 2022-01-14 — End: 2022-01-14
  Administered 2022-01-14: 4 mg via ORAL
  Filled 2022-01-14: qty 1

## 2022-01-14 NOTE — ED Provider Triage Note (Signed)
Emergency Medicine Provider Triage Evaluation Note  Patricia [name] , a 51 y.o. female  was evaluated in triage.  Pt complains of migraine for 3 days.  Feels like her typical migraine.  Reports frequent IV migraine cocktails in the past.  Has also been vomiting today.  Review of Systems  Positive: Migraine headache, nausea, vomiting Negative: Dizziness, lightheadedness, blurry vision, weakness  Physical Exam  BP (!) 166/109 (BP Location: Left Arm)   Pulse 95   Temp 98.9 F (37.2 C) (Oral)   Resp 18   Ht '5\' 4"'$  (1.626 m)   Wt 103.9 kg   LMP 07/07/2014 (Approximate)   SpO2 99%   BMI 39.31 kg/m  Gen:   Awake, no distress   Resp:  Normal effort  MSK:   Moves extremities without difficulty  Other:    Medical Decision Making  Medically screening exam initiated at 2:13 PM.  Appropriate orders placed.  [name] was informed that the remainder of the evaluation will be completed by another provider, this initial triage assessment does not replace that evaluation, and the importance of remaining in the ED until their evaluation is complete.    Deni Lefever T, PA-C 01/14/22 1413

## 2022-01-14 NOTE — ED Triage Notes (Signed)
Pt c/o three days worth of generalized migraine. Pt states that this morning she began having N/V. Unable to control pain with PRN medications. Requesting migraine cocktail. Pt denies changes in vision, weakness.

## 2022-01-15 ENCOUNTER — Emergency Department (HOSPITAL_COMMUNITY): Payer: Medicaid Other

## 2022-01-15 ENCOUNTER — Encounter (HOSPITAL_COMMUNITY): Payer: Self-pay

## 2022-01-15 LAB — PROTIME-INR
INR: 0.9 (ref 0.8–1.2)
Prothrombin Time: 12.2 seconds (ref 11.4–15.2)

## 2022-01-15 LAB — CBC WITH DIFFERENTIAL/PLATELET
Abs Immature Granulocytes: 0.01 10*3/uL (ref 0.00–0.07)
Basophils Absolute: 0.1 10*3/uL (ref 0.0–0.1)
Basophils Relative: 1 %
Eosinophils Absolute: 0.1 10*3/uL (ref 0.0–0.5)
Eosinophils Relative: 1 %
HCT: 41.9 % (ref 36.0–46.0)
Hemoglobin: 13.7 g/dL (ref 12.0–15.0)
Immature Granulocytes: 0 %
Lymphocytes Relative: 33 %
Lymphs Abs: 2.8 10*3/uL (ref 0.7–4.0)
MCH: 27 pg (ref 26.0–34.0)
MCHC: 32.7 g/dL (ref 30.0–36.0)
MCV: 82.5 fL (ref 80.0–100.0)
Monocytes Absolute: 0.6 10*3/uL (ref 0.1–1.0)
Monocytes Relative: 7 %
Neutro Abs: 5 10*3/uL (ref 1.7–7.7)
Neutrophils Relative %: 58 %
Platelets: 343 10*3/uL (ref 150–400)
RBC: 5.08 MIL/uL (ref 3.87–5.11)
RDW: 13.5 % (ref 11.5–15.5)
WBC: 8.5 10*3/uL (ref 4.0–10.5)
nRBC: 0 % (ref 0.0–0.2)

## 2022-01-15 LAB — BASIC METABOLIC PANEL
Anion gap: 10 (ref 5–15)
BUN: 13 mg/dL (ref 6–20)
CO2: 25 mmol/L (ref 22–32)
Calcium: 9.7 mg/dL (ref 8.9–10.3)
Chloride: 103 mmol/L (ref 98–111)
Creatinine, Ser: 0.87 mg/dL (ref 0.44–1.00)
GFR, Estimated: >60 mL/min (ref >60)
Glucose, Bld: 104 mg/dL — ABNORMAL HIGH (ref 70–99)
Potassium: 3.6 mmol/L (ref 3.5–5.1)
Sodium: 138 mmol/L (ref 135–145)

## 2022-01-15 MED ORDER — DIPHENHYDRAMINE HCL 50 MG/ML IJ SOLN
25.0000 mg | Freq: Once | INTRAMUSCULAR | Status: AC
  Administered 2022-01-15: 25 mg via INTRAVENOUS
  Filled 2022-01-15: qty 1

## 2022-01-15 MED ORDER — IOHEXOL 350 MG/ML SOLN
75.0000 mL | Freq: Once | INTRAVENOUS | Status: AC | PRN
  Administered 2022-01-15: 75 mL via INTRAVENOUS

## 2022-01-15 MED ORDER — PROCHLORPERAZINE EDISYLATE 10 MG/2ML IJ SOLN
10.0000 mg | Freq: Once | INTRAMUSCULAR | Status: AC
  Administered 2022-01-15: 10 mg via INTRAVENOUS
  Filled 2022-01-15: qty 2

## 2022-01-15 MED ORDER — SODIUM CHLORIDE (PF) 0.9 % IJ SOLN
INTRAMUSCULAR | Status: AC
  Filled 2022-01-15: qty 50

## 2022-01-15 MED ORDER — MORPHINE SULFATE (PF) 4 MG/ML IV SOLN
4.0000 mg | Freq: Once | INTRAVENOUS | Status: AC
  Administered 2022-01-15: 4 mg via INTRAVENOUS
  Filled 2022-01-15: qty 1

## 2022-01-15 NOTE — Discharge Instructions (Signed)
You were seen today for a headache.  You were treated.  Your CT scan suggested you may have a small subdural hematoma; however this was questionable.  Your CT angio did not show any evidence of aneurysm and you did not report any falls.  I feel this is likely artifactual.  Continue your migraine medication at home and follow-up with neurology.
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
## 2022-01-16 ENCOUNTER — Encounter: Payer: Self-pay | Admitting: Neurology

## 2022-01-16 ENCOUNTER — Ambulatory Visit (INDEPENDENT_AMBULATORY_CARE_PROVIDER_SITE_OTHER): Payer: Medicaid Other | Admitting: Neurology

## 2022-01-16 ENCOUNTER — Telehealth (HOSPITAL_COMMUNITY): Payer: Self-pay | Admitting: Pharmacy Technician

## 2022-01-16 ENCOUNTER — Other Ambulatory Visit (HOSPITAL_COMMUNITY): Payer: Self-pay

## 2022-01-16 VITALS — BP 119/86 | HR 99 | Ht 64.0 in | Wt 227.0 lb

## 2022-01-16 DIAGNOSIS — G43019 Migraine without aura, intractable, without status migrainosus: Secondary | ICD-10-CM | POA: Diagnosis not present

## 2022-01-16 MED ORDER — KETOROLAC TROMETHAMINE 60 MG/2ML IM SOLN
60.0000 mg | Freq: Once | INTRAMUSCULAR | Status: AC
  Administered 2022-01-16: 60 mg via INTRAMUSCULAR

## 2022-01-16 MED ORDER — TOPIRAMATE 25 MG PO TABS: 25.0000 mg | ORAL_TABLET | Freq: Every day | ORAL | 5 refills | Status: DC

## 2022-01-16 MED ORDER — CYCLOBENZAPRINE HCL 10 MG PO TABS: 10.0000 mg | ORAL_TABLET | Freq: Three times a day (TID) | ORAL | 3 refills | Status: DC | PRN

## 2022-01-16 MED ORDER — UBRELVY 100 MG PO TABS: 1.0000 | ORAL_TABLET | ORAL | 5 refills | Status: DC | PRN

## 2022-01-16 NOTE — Addendum Note (Signed)
Addended by: Venetia Night on: 01/16/2022 02:27 PM   Modules accepted: Orders

## 2022-01-16 NOTE — Patient Instructions (Signed)
Start topiramate '25mg'$  at bedtime.  If no improvement in headaches in 4 weeks, contact me and we can increase dose Take cyclobenzaprine up to 3 times daily as needed for onset of neck and face pain.   At earliest onset of headache, take Ubrelvy.  May repeat in 2 hours.  Maximum 2 tablets in 24 hours Limit use of pain relievers to no more than 2 days out of week to prevent risk of rebound or medication-overuse headache. Keep headache diary Follow up 4 months.

## 2022-01-16 NOTE — Telephone Encounter (Signed)
Patient Advocate Encounter   Received notification that prior authorization for Ubrelvy 100 mg tablets is required.   PA submitted on 01/16/2022 Confirmation #:[id number] W Status is pending       Lyndel Safe, [place] Patient Advocate Specialist [place] Patient Advocate Team Direct Number: [phone]  Fax: [phone]

## 2022-01-16 NOTE — Telephone Encounter (Signed)
Patient Advocate Encounter  Prior Authorization for Ubrelvy 100 mg tablets  has been approved.    PA# [id number] Effective dates: 01/16/2022 through 01/16/2023      Lyndel Safe, [place] Patient Advocate Specialist [place] Patient Advocate Team Direct Number: [phone]  Fax: [phone]

## 2022-01-28 ENCOUNTER — Emergency Department (HOSPITAL_COMMUNITY)
Admission: EM | Admit: 2022-01-28 | Discharge: 2022-01-28 | Discharge status: Against Medical Advice | Payer: Medicaid Other | Attending: Emergency Medicine | Admitting: Emergency Medicine

## 2022-01-28 ENCOUNTER — Emergency Department (HOSPITAL_COMMUNITY): Payer: Medicaid Other

## 2022-01-28 ENCOUNTER — Other Ambulatory Visit: Payer: Self-pay

## 2022-01-28 ENCOUNTER — Encounter (HOSPITAL_COMMUNITY): Payer: Self-pay | Admitting: *Deleted

## 2022-01-28 DIAGNOSIS — Z5321 Procedure and treatment not carried out due to patient leaving prior to being seen by health care provider: Secondary | ICD-10-CM | POA: Insufficient documentation

## 2022-01-28 DIAGNOSIS — R2232 Localized swelling, mass and lump, left upper limb: Secondary | ICD-10-CM | POA: Diagnosis present
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
## 2022-02-07 ENCOUNTER — Ambulatory Visit
Admission: RE | Admit: 2022-02-07 | Discharge: 2022-02-07 | Disposition: A | Discharge status: Home / Self Care | Payer: Medicaid Other | Source: Ambulatory Visit | Attending: Surgery | Admitting: Surgery

## 2022-02-07 DIAGNOSIS — R112 Nausea with vomiting, unspecified: Secondary | ICD-10-CM

## 2022-02-07 MED ORDER — DIPHENHYDRAMINE HCL 50 MG/ML IJ SOLN
50.0000 mg | Freq: Once | INTRAMUSCULAR | Status: AC
  Administered 2022-02-07: 50 mg via INTRAVENOUS

## 2022-02-07 MED ORDER — IOPAMIDOL (ISOVUE-300) INJECTION 61%
100.0000 mL | Freq: Once | INTRAVENOUS | Status: AC | PRN
  Administered 2022-02-07: 100 mL via INTRAVENOUS

## 2022-02-07 NOTE — Progress Notes (Signed)
Pt brought over to nursing recovery area by CT staff following CT scan with contrast due to patient c/o itching and shortness of breath. Pts vitals were obtained, oxygen level >95, see flowsheets. Dr. Jobe Igo at bedside, ordered 50 mg of benadryl to be given. Pt reported she has asthma and uses an albuterol inhaler PRN and requested to use it as she had it in her hand. Dr. Jobe Igo advised the patient to do so as benadryl was being administered. No rash, hives, swelling of lips, tongue, face noted. Within 5 minutes of administering IV benadryl pt reported relief in itching and shortness of breath. Pt reports "I feel great" at 1346 and requested to leave DRI. Dr. Jobe Igo reassessed the patient and agreed with d/c. Pt advised that she would need a 13 hr prep prior to any other CT scan with contrast and to seek medical attention if any symptoms of reaction were to reoccur again (SOB, itching, rash, hives, etc... ). Pt verbalized understanding.

## 2022-02-14 ENCOUNTER — Ambulatory Visit: Payer: Medicaid Other | Admitting: Family Medicine

## 2022-02-19 NOTE — Telephone Encounter (Signed)
Spoke with patient and verified dob. She states that she was able to get her nurse care.

## 2022-03-20 ENCOUNTER — Other Ambulatory Visit: Payer: Self-pay | Admitting: Family Medicine

## 2022-03-20 DIAGNOSIS — G4700 Insomnia, unspecified: Secondary | ICD-10-CM

## 2022-03-27 ENCOUNTER — Ambulatory Visit: Payer: Self-pay | Admitting: Surgery

## 2022-04-10 ENCOUNTER — Telehealth: Payer: Medicaid Other | Admitting: Family Medicine

## 2022-04-10 ENCOUNTER — Emergency Department (HOSPITAL_COMMUNITY): Payer: Medicaid Other

## 2022-04-10 ENCOUNTER — Encounter (HOSPITAL_COMMUNITY): Payer: Self-pay | Admitting: *Deleted

## 2022-04-10 ENCOUNTER — Emergency Department (HOSPITAL_COMMUNITY)
Admission: EM | Admit: 2022-04-10 | Discharge: 2022-04-10 | Disposition: A | Discharge status: Home / Self Care | Payer: Medicaid Other | Attending: Emergency Medicine | Admitting: Emergency Medicine

## 2022-04-10 ENCOUNTER — Telehealth: Payer: Self-pay | Admitting: Family Medicine

## 2022-04-10 ENCOUNTER — Other Ambulatory Visit: Payer: Self-pay

## 2022-04-10 DIAGNOSIS — R110 Nausea: Secondary | ICD-10-CM | POA: Diagnosis not present

## 2022-04-10 DIAGNOSIS — Z91010 Allergy to peanuts: Secondary | ICD-10-CM | POA: Insufficient documentation

## 2022-04-10 DIAGNOSIS — R079 Chest pain, unspecified: Secondary | ICD-10-CM | POA: Diagnosis present

## 2022-04-10 DIAGNOSIS — E119 Type 2 diabetes mellitus without complications: Secondary | ICD-10-CM | POA: Diagnosis not present

## 2022-04-10 DIAGNOSIS — R252 Cramp and spasm: Secondary | ICD-10-CM | POA: Insufficient documentation

## 2022-04-10 DIAGNOSIS — J45909 Unspecified asthma, uncomplicated: Secondary | ICD-10-CM | POA: Diagnosis not present

## 2022-04-10 DIAGNOSIS — I10 Essential (primary) hypertension: Secondary | ICD-10-CM | POA: Insufficient documentation

## 2022-04-10 DIAGNOSIS — Z79899 Other long term (current) drug therapy: Secondary | ICD-10-CM | POA: Diagnosis not present

## 2022-04-10 DIAGNOSIS — R0602 Shortness of breath: Secondary | ICD-10-CM | POA: Insufficient documentation

## 2022-04-10 DIAGNOSIS — R0789 Other chest pain: Secondary | ICD-10-CM | POA: Insufficient documentation

## 2022-04-10 DIAGNOSIS — E876 Hypokalemia: Secondary | ICD-10-CM | POA: Diagnosis not present

## 2022-04-10 LAB — TROPONIN I (HIGH SENSITIVITY)
Troponin I (High Sensitivity): 2 ng/L (ref <18)
Troponin I (High Sensitivity): 3 ng/L

## 2022-04-10 LAB — CBC
HCT: 46.5 % — ABNORMAL HIGH (ref 36.0–46.0)
Hemoglobin: 14.6 g/dL (ref 12.0–15.0)
MCH: 26.5 pg (ref 26.0–34.0)
MCHC: 31.4 g/dL (ref 30.0–36.0)
MCV: 84.4 fL (ref 80.0–100.0)
Platelets: 375 10*3/uL (ref 150–400)
RBC: 5.51 MIL/uL — ABNORMAL HIGH (ref 3.87–5.11)
RDW: 14.5 % (ref 11.5–15.5)
WBC: 8.6 10*3/uL (ref 4.0–10.5)
nRBC: 0 % (ref 0.0–0.2)

## 2022-04-10 LAB — BASIC METABOLIC PANEL WITH GFR
Anion gap: 10 (ref 5–15)
BUN: 16 mg/dL (ref 6–20)
CO2: 25 mmol/L (ref 22–32)
Calcium: 9.5 mg/dL (ref 8.9–10.3)
Chloride: 102 mmol/L (ref 98–111)
Creatinine, Ser: 1.06 mg/dL — ABNORMAL HIGH (ref 0.44–1.00)
GFR, Estimated: >60 mL/min
Glucose, Bld: 130 mg/dL — ABNORMAL HIGH (ref 70–99)
Potassium: 3.3 mmol/L — ABNORMAL LOW (ref 3.5–5.1)
Sodium: 137 mmol/L (ref 135–145)

## 2022-04-10 LAB — D-DIMER, QUANTITATIVE: D-Dimer, Quant: <0.27 ug/mL-FEU (ref 0.00–0.50)

## 2022-04-10 MED ORDER — METHOCARBAMOL 500 MG PO TABS
500.0000 mg | ORAL_TABLET | Freq: Once | ORAL | Status: AC
  Administered 2022-04-10: 500 mg via ORAL
  Filled 2022-04-10: qty 1

## 2022-04-10 MED ORDER — MAGNESIUM OXIDE -MG SUPPLEMENT 400 (240 MG) MG PO TABS
800.0000 mg | ORAL_TABLET | Freq: Once | ORAL | Status: AC
  Administered 2022-04-10: 800 mg via ORAL
  Filled 2022-04-10: qty 2

## 2022-04-10 MED ORDER — POTASSIUM CHLORIDE CRYS ER 20 MEQ PO TBCR
40.0000 meq | EXTENDED_RELEASE_TABLET | Freq: Once | ORAL | Status: AC
  Administered 2022-04-10: 40 meq via ORAL
  Filled 2022-04-10: qty 2

## 2022-04-10 NOTE — Discharge Instructions (Signed)
Follow-up with your PCP for ongoing management of your chronic medical problems.  Continue to take home medications as prescribed.  Take muscle relaxers at home as needed, if you do experience relief.  Stay hydrated.  Eat fruits and vegetables for maintenance of normal electrolyte levels.

## 2022-04-10 NOTE — ED Triage Notes (Signed)
Pt with back pain couple days ago and then began to have cramping to legs and neck.  Last night began to have generalized chest pain felt like cramping at first, took some mustard and it stopped.  This morning cp returned. + SOB and nausea.

## 2022-04-10 NOTE — Telephone Encounter (Signed)
FYI, I Spoke with patient and she stated that the pain started two days ago in her back and this morning she experienced cramping in her legs, neck , back and chest (under her breast). She stated that she took two baby aspirin this morning. I advised her that the ER would be the best option for care vs a video visit here. She stated she spoke with the triage nurse not long ago and was advised the same thing. She state she will go to the ER in the next hour.

## 2022-04-10 NOTE — Telephone Encounter (Signed)
Caller Name: Lailana Shira Call back phone #: [phone]  Reason for Call: Pt called after experiencing painful cramping in her chest and stated that it was spreading. The earliest appt with [place] was 4pm with Dr. Gena Fray, she insisted on MyChart video visit. I Triaged pt  to see if this appt was okay or if she needed to be seen more urgently.

## 2022-04-10 NOTE — ED Provider Triage Note (Signed)
Emergency Medicine Provider Triage Evaluation Note  Patricia [name] , a 51 y.o. female  was evaluated in triage.  Pt complains of cramping to multiple areas of her body.  Symptoms began last evening.  She describes having sharp cramping pains that initially began in her right back and moved to her right leg then left leg, bilateral feet and chest.  Pain spontaneously stopped after eating mustard last evening.  This morning, chest pain had returned.  She describes cramping pain just under her left breast.  Pain was associated with shortness of breath and some nausea.  Pain has been waxing and waning in severity since onset.  Denies prior cardiac history.  She does endorse history of hypertension and takes hydrochlorothiazide.She complains of left-sided chest pain with deep breathing.  No shortness of breath.  Describes chest pain is minimal at this time  Review of Systems  Positive: Cramping to multiple areas of her body, left-sided chest pain Negative: Fever, cough, shortness of breath, peripheral edema  Physical Exam  BP 113/82 (BP Location: Right Arm)   Pulse 83   Temp 98.4 F (36.9 C) (Oral)   Resp 20   Ht '5\' 4"'$  (1.626 m)   Wt 109.3 kg   LMP 07/07/2014 (Approximate)   SpO2 100%   BMI 41.37 kg/m  Gen:   Awake, no distress   Resp:  Normal effort lungs clear to auscultation bilaterally MSK:   Moves extremities without difficulty  Other:  Tenderness to palpation of the left lower chest wall  Medical Decision Making  Medically screening exam initiated at 1:12 PM.  Appropriate orders placed.  Patricia [name] was informed that the remainder of the evaluation will be completed by another provider, this initial triage assessment does not replace that evaluation, and the importance of remaining in the ED until their evaluation is complete.  Patient here with intermittent cramping to multiple areas of the body, left-sided chest pain.  She will need further work-up in the ER.  She is  agreeable to plan.   Kem Parkinson, PA-C 04/10/22 1331

## 2022-04-10 NOTE — ED Provider Notes (Signed)
[facility] Provider Note   CSN: [id number] Arrival date & time: 04/10/22  1108     History  Chief Complaint  Patient presents with   Chest Pain    Patricia [name] is a 51 y.o. female.   Chest Pain Associated symptoms: shortness of breath   Patient presents for muscle cramps.  Medical history includes DM2, depression, OSA, migraines, asthma, obesity, schizophrenia, HTN, HLD.  Last night, patient began to have cramping to multiple areas of her body.  Initially started in the right side of her back and moved to her right leg and left leg.  She had persistent migrating cramps that subsequently included her chest.  Symptoms improved with eating mustard last night.  This morning, she had recurrence of cramping pain.  Cramping pain today was under her left breast.  She endorsed associated nausea and shortness of breath.  Left-sided chest pain is pleuritic.  Currently, patient endorses very mild symptoms only.  These areas of cramps continue to migrate throughout her body.  She denies any current shortness of breath.  She denies any other associated symptoms.     Home Medications Prior to Admission medications   Medication Sig Start Date End Date Taking? Authorizing Provider  albuterol (VENTOLIN HFA) 108 (90 Base) MCG/ACT inhaler Inhale 2 puffs into the lungs every 6 (six) hours as needed for wheezing or shortness of breath. 11/19/21   [name], MD  amLODipine (NORVASC) 10 MG tablet Take 1 tablet (10 mg total) by mouth daily. 11/19/21 02/17/22  [name], MD  cyclobenzaprine (FLEXERIL) 10 MG tablet Take 1 tablet (10 mg total) by mouth 3 (three) times daily as needed for muscle spasms. 01/16/22   Pieter Partridge, DO  dicyclomine (BENTYL) 20 MG tablet Take 1 tablet (20 mg total) by mouth 4 (four) times daily -  before meals and at bedtime. 11/19/21 02/17/22  [name], MD  diphenhydrAMINE (BENADRYL) 25 MG tablet Take 25 mg by mouth every 6 (six) hours as  needed for itching or allergies.    [provider]  Elastic Bandages & Supports (WRIST BRACE//LEFT LARGE) MISC 1 each by Does not apply route at bedtime. 11/19/21   [name], MD  hydrochlorothiazide (HYDRODIURIL) 25 MG tablet Take 1 tablet (25 mg total) by mouth daily. 11/19/21 02/17/22  [name], MD  meloxicam (MOBIC) 15 MG tablet Take 1 tablet (15 mg total) by mouth daily. 11/19/21   [name], MD  pantoprazole (PROTONIX) 40 MG tablet Take 1 tablet (40 mg total) by mouth daily. 11/19/21 02/17/22  [name], MD  topiramate (TOPAMAX) 25 MG tablet Take 1 tablet (25 mg total) by mouth at bedtime. 01/16/22   Pieter Partridge, DO  traZODone (DESYREL) 100 MG tablet TAKE 1 TABLET BY MOUTH AT BEDTIME 03/20/22   [name], MD  Ubrogepant (UBRELVY) 100 MG TABS Take 1 tablet by mouth as needed (May repeat after 2 hours.  Maximum 2 tablets in 24 hours). 01/16/22   Pieter Partridge, DO      Allergies    Iodinated contrast media and Peanut-containing drug products    Review of Systems   Review of Systems  Respiratory:  Positive for shortness of breath.   Cardiovascular:  Positive for chest pain.  Musculoskeletal:  Positive for myalgias.  All other systems reviewed and are negative.   Physical Exam Updated Vital Signs BP 113/82 (BP Location: Right Arm)   Pulse 83  Temp 98.4 F (36.9 C) (Oral)   Resp 20   Ht '5\' 4"'$  (1.626 m)   Wt 109.3 kg   LMP 07/07/2014 (Approximate)   SpO2 100%   BMI 41.37 kg/m  Physical Exam Vitals and nursing note reviewed.  Constitutional:      General: She is not in acute distress.    Appearance: She is well-developed. She is not ill-appearing, toxic-appearing or diaphoretic.  HENT:     Head: Normocephalic and atraumatic.  Eyes:     Conjunctiva/sclera: Conjunctivae normal.  Neck:     Vascular: No JVD.  Cardiovascular:     Rate and Rhythm: Normal rate and regular rhythm.     Heart sounds: No murmur heard. Pulmonary:      Effort: Pulmonary effort is normal. No respiratory distress.     Breath sounds: No decreased breath sounds, wheezing, rhonchi or rales.  Chest:     Chest wall: Tenderness present.  Abdominal:     Palpations: Abdomen is soft.     Tenderness: There is no abdominal tenderness.  Musculoskeletal:        General: No swelling.     Cervical back: Normal range of motion and neck supple.     Right lower leg: No edema.     Left lower leg: No edema.  Skin:    General: Skin is warm and dry.     Capillary Refill: Capillary refill takes less than 2 seconds.     Coloration: Skin is not cyanotic or pale.  Neurological:     General: No focal deficit present.     Mental Status: She is alert and oriented to person, place, and time.  Psychiatric:        Mood and Affect: Mood normal.        Behavior: Behavior normal.     ED Results / Procedures / Treatments   Labs (all labs ordered are listed, but only abnormal results are displayed) Labs Reviewed  BASIC METABOLIC PANEL - Abnormal; Notable for the following components:      Result Value   Potassium 3.3 (*)    Glucose, Bld 130 (*)    Creatinine, Ser 1.06 (*)    All other components within normal limits  CBC - Abnormal; Notable for the following components:   RBC 5.51 (*)    HCT 46.5 (*)    All other components within normal limits  D-DIMER, QUANTITATIVE  TROPONIN I (HIGH SENSITIVITY)  TROPONIN I (HIGH SENSITIVITY)    EKG EKG Interpretation  Date/Time:  Wednesday April 10 2022 11:29:18 EDT Ventricular Rate:  108 PR Interval:  126 QRS Duration: 76 QT Interval:  332 QTC Calculation: 444 R Axis:   45 Text Interpretation: Sinus tachycardia Nonspecific T wave abnormality Confirmed by Godfrey Pick [phone]) on 04/10/2022 3:04:17 PM  Radiology DG Chest 2 View  Result Date: 04/10/2022 CLINICAL DATA:  Chest pain, shortness of breath EXAM: CHEST - 2 VIEW COMPARISON:  09/06/2020 FINDINGS: Cardiac size is within normal limits. There are no signs  of pulmonary edema or focal pulmonary consolidation. Left hemidiaphragm is elevated. There is no pleural effusion or pneumothorax. Patient's chin is partially obscuring the right apex. IMPRESSION: No active cardiopulmonary disease. Electronically Signed   By: Elmer Picker M.D.   On: 04/10/2022 12:11    Procedures Procedures    Medications Ordered in ED Medications  magnesium oxide (MAG-OX) tablet 800 mg (has no administration in time range)  methocarbamol (ROBAXIN) tablet 500 mg (has no administration in time range)  potassium chloride SA (KLOR-CON M) CR tablet 40 mEq (40 mEq Oral Given 04/10/22 1501)    ED Course/ Medical Decision Making/ A&P                           Medical Decision Making Amount and/or Complexity of Data Reviewed Labs: ordered. Radiology: ordered.  Risk OTC drugs. Prescription drug management.   This patient presents to the ED for concern of muscle cramps, this involves an extensive number of treatment options, and is a complaint that carries with it a high risk of complications and morbidity.  The differential diagnosis includes nutrition, electrolyte abnormalities, myositis   Co morbidities that complicate the patient evaluation  DM2, depression, OSA, migraines, asthma, obesity, schizophrenia, HTN, HLD   Additional history obtained:  Additional history obtained from N/A External records from outside source obtained and reviewed including EMR   Lab Tests:  I Ordered, and personally interpreted labs.  The pertinent results include: Mild hypokalemia with otherwise normal electrolytes, normal troponins x2, normal D-dimer   Imaging Studies ordered:  I ordered imaging studies including chest x-ray I independently visualized and interpreted imaging which showed no acute findings I agree with the radiologist interpretation   Cardiac Monitoring: / EKG:  The patient was maintained on a cardiac monitor.  I personally viewed and interpreted the  cardiac monitored which showed an underlying rhythm of: Sinus rhythm  Problem List / ED Course / Critical interventions / Medication management  Patient presents for migrating muscle cramps beginning last night.  Today this included her left side of chest and this prompted her to come to the ED.  Prior to being bedded in the ED, diagnostic work-up was initiated.  Patient's lab work shows hypokalemia, slight elevation in creatinine from baseline, normal hemoglobin, no leukocytosis, negative D-dimer normal troponins x2.  Chest x-ray shows no acute findings and initial EKG showed nonspecific T wave abnormalities which are likely rate dependent.  Although patient was tachycardic on arrival, her heart rate has normalized.  On assessment, patient is well-appearing.  She endorses very mild symptoms at this time only.  She denies any current shortness of breath.  Patient was given replacement potassium for her hypokalemia.  Her magnesium replacement was given as well.  She does take a diuretic and diuresis may be contributing to her dehydration and electrolyte abnormalities.  She has prescriptions for muscle relaxants at home but has not taken any because she has ran out.  She does have refills that she can pick up.  She was given a dose of Robaxin in the ED as a trial for symptomatic relief.  She was otherwise advised to stay hydrated, continue movement, and eat fruits and vegetables to maintain normal electrolyte levels.  She is stable for discharge. I ordered medication including potassium chloride, magnesium oxide for electrolyte optimization; Robaxin for muscle cramps Reevaluation of the patient after these medicines showed that the patient improved I have reviewed the patients home medicines and have made adjustments as needed   Social Determinants of Health:  Has access to outpatient care        Final Clinical Impression(s) / ED Diagnoses Final diagnoses:  Muscle cramps    Rx / DC Orders ED  Discharge Orders     None         Godfrey Pick, MD 04/10/22 [phone]

## 2022-04-10 NOTE — Telephone Encounter (Signed)
Pt called back, triage recommenced she go to the ER and be seen in person. She is wanting to wait until 4pm and is still insisting on MyChart visit. Said she would call back if she decided that ER visit is necessary.

## 2022-04-10 NOTE — ED Notes (Signed)
Kem Parkinson, PA made aware that pt is requesting something for her pain.

## 2022-04-11 ENCOUNTER — Telehealth: Payer: Self-pay | Admitting: Family Medicine

## 2022-04-11 NOTE — Telephone Encounter (Signed)
Please advise, see below.  I don't see anything on her ED note regarding changes

## 2022-04-11 NOTE — Telephone Encounter (Signed)
Spoke with patient and advised her of annotation below. She became irate and stated that we can cancel her appointment on Tuesday 04/16/2022 at 10:40am with Dr. Grandville Silos.

## 2022-04-11 NOTE — Telephone Encounter (Signed)
Dr. Grandville Silos is this something we do? When I spoke with her this morning she declined a visit with you on tomorrow and stated next week would be better.

## 2022-04-11 NOTE — Telephone Encounter (Signed)
Pt needs a letter faxed to [facility] fax [phone]. The letter needs to say Dr Grandville Silos has no openings the rest of the week. I'm not really sure of this, I have never encountered this request. If it's something we need to do let me know and I will figure something out.

## 2022-04-11 NOTE — Telephone Encounter (Signed)
Patient scheduled on 04/16/22 at 10:40 am with Dr. Grandville Silos for hospital f/u.

## 2022-04-11 NOTE — Telephone Encounter (Signed)
Pt called  [phone]   Pt was advised to go to Ed for cramping in her stomach. They diagnosed that it is one of her BP meds causing the cramps. She did not know which one it is. She wants a return call to change her med. I suggested an appt to discuss but she didn't want to come in. Please call.

## 2022-04-16 ENCOUNTER — Inpatient Hospital Stay: Payer: Medicaid Other | Admitting: Family Medicine

## 2022-04-16 NOTE — Pre-Procedure Instructions (Signed)
Surgical Instructions    Your procedure is scheduled on April 23, 2022.  Report to [facility] Main Entrance "A" at 10:00 A.M., then check in with the Admitting office.  Call this number if you have problems the morning of surgery:  [phone]   If you have any questions prior to your surgery date call [phone]: Open Monday-Friday 8am-4pm    Remember:  Do not eat after midnight the night before your surgery  You may drink clear liquids until 9:00 AM the morning of your surgery.   Clear liquids allowed are: Water, Non-Citrus Juices (without pulp), Carbonated Beverages, Clear Tea, Black Coffee Only (NO MILK, CREAM OR POWDERED CREAMER of any kind), and Gatorade.     Take these medicines the morning of surgery with A SIP OF WATER:  amLODipine (NORVASC)    Take these medicines the morning of surgery with a sip of water AS NEEDED:  albuterol (VENTOLIN HFA) inhaler  diphenhydrAMINE (BENADRYL)   pantoprazole (PROTONIX)   Ubrogepant (UBRELVY)  hydrOXYzine (ATARAX)   As of today, STOP taking any Aspirin (unless otherwise instructed by your surgeon) Aleve, Naproxen, Ibuprofen, Motrin, Advil, Goody's, BC's, all herbal medications, fish oil, and all vitamins.   HOW TO MANAGE YOUR DIABETES BEFORE AND AFTER SURGERY  Why is it important to control my blood sugar before and after surgery? Improving blood sugar levels before and after surgery helps healing and can limit problems. A way of improving blood sugar control is eating a healthy diet by:  Eating less sugar and carbohydrates  Increasing activity/exercise  Talking with your doctor about reaching your blood sugar goals High blood sugars (greater than 180 mg/dL) can raise your risk of infections and slow your recovery, so you will need to focus on controlling your diabetes during the weeks before surgery. Make sure that the doctor who takes care of your diabetes knows about your planned surgery including the date and  location.  How do I manage my blood sugar before surgery? Check your blood sugar at least 4 times a day, starting 2 days before surgery, to make sure that the level is not too high or low.  Check your blood sugar the morning of your surgery when you wake up and every 2 hours until you get to the Short Stay unit.  If your blood sugar is less than 70 mg/dL, you will need to treat for low blood sugar: Do not take insulin. Treat a low blood sugar (less than 70 mg/dL) with  cup of clear juice (cranberry or apple), 4 glucose tablets, OR glucose gel. Recheck blood sugar in 15 minutes after treatment (to make sure it is greater than 70 mg/dL). If your blood sugar is not greater than 70 mg/dL on recheck, call [phone] for further instructions. Report your blood sugar to the short stay nurse when you get to Short Stay.  If you are admitted to the hospital after surgery: Your blood sugar will be checked by the staff and you will probably be given insulin after surgery (instead of oral diabetes medicines) to make sure you have good blood sugar levels. The goal for blood sugar control after surgery is 80-180 mg/dL.             Do NOT Smoke (Tobacco/Vaping) for 24 hours prior to your procedure.  If you use a CPAP at night, you may bring your mask/headgear for your overnight stay.   Contacts, glasses, piercing's, hearing aid's, dentures or partials may not be worn into surgery, please  bring cases for these belongings.    For patients admitted to the hospital, discharge time will be determined by your treatment team.   Patients discharged the day of surgery will not be allowed to drive home, and someone needs to stay with them for 24 hours.  SURGICAL WAITING ROOM VISITATION Patients having surgery or a procedure may have no more than 2 support people in the waiting area - these visitors may rotate.   Children under the age of 63 must have an adult with them who is not the patient. If the patient  needs to stay at the hospital during part of their recovery, the visitor guidelines for inpatient rooms apply. Pre-op nurse will coordinate an appropriate time for 1 support person to accompany patient in pre-op.  This support person may not rotate.   Please refer to the [facility] website for the visitor guidelines for Inpatients (after your surgery is over and you are in a regular room).    Special instructions:   [place]- Preparing For Surgery  Before surgery, you can play an important role. Because skin is not sterile, your skin needs to be as free of germs as possible. You can reduce the number of germs on your skin by washing with CHG (chlorahexidine gluconate) Soap before surgery.  CHG is an antiseptic cleaner which kills germs and bonds with the skin to continue killing germs even after washing.    Oral Hygiene is also important to reduce your risk of infection.  Remember - BRUSH YOUR TEETH THE MORNING OF SURGERY WITH YOUR REGULAR TOOTHPASTE  Please do not use if you have an allergy to CHG or antibacterial soaps. If your skin becomes reddened/irritated stop using the CHG.  Do not shave (including legs and underarms) for at least 48 hours prior to first CHG shower. It is OK to shave your face.  Please follow these instructions carefully.   Shower the NIGHT BEFORE SURGERY and the MORNING OF SURGERY  If you chose to wash your hair, wash your hair first as usual with your normal shampoo.  After you shampoo, rinse your hair and body thoroughly to remove the shampoo.  Use CHG Soap as you would any other liquid soap. You can apply CHG directly to the skin and wash gently with a scrungie or a clean washcloth.   Apply the CHG Soap to your body ONLY FROM THE NECK DOWN.  Do not use on open wounds or open sores. Avoid contact with your eyes, ears, mouth and genitals (private parts). Wash Face and genitals (private parts)  with your normal soap.   Wash thoroughly, paying special attention  to the area where your surgery will be performed.  Thoroughly rinse your body with warm water from the neck down.  DO NOT shower/wash with your normal soap after using and rinsing off the CHG Soap.  Pat yourself dry with a CLEAN TOWEL.  Wear CLEAN PAJAMAS to bed the night before surgery  Place CLEAN SHEETS on your bed the night before your surgery  DO NOT SLEEP WITH PETS.   Day of Surgery: Take a shower with CHG soap. Do not wear jewelry or makeup Do not wear lotions, powders, perfumes, or deodorant. Do not shave 48 hours prior to surgery.   Do not bring valuables to the hospital.  [facility] is not responsible for any belongings or valuables. Do not wear nail polish, gel polish, artificial nails, or any other type of covering on natural nails (fingers and toes)  If you have artificial nails or gel coating that need to be removed by a nail salon, please have this removed prior to surgery. Artificial nails or gel coating may interfere with anesthesia's ability to adequately monitor your vital signs.  Wear Clean/Comfortable clothing the morning of surgery Remember to brush your teeth WITH YOUR REGULAR TOOTHPASTE.   Please read over the following fact sheets that you were given.    If you received a COVID test during your pre-op visit  it is requested that you wear a mask when out in public, stay away from anyone that may not be feeling well and notify your surgeon if you develop symptoms. If you have been in contact with anyone that has tested positive in the last 10 days please notify you surgeon.

## 2022-04-17 ENCOUNTER — Encounter (HOSPITAL_COMMUNITY): Payer: Self-pay

## 2022-04-17 ENCOUNTER — Encounter (HOSPITAL_COMMUNITY)
Admission: RE | Admit: 2022-04-17 | Discharge: 2022-04-17 | Disposition: A | Discharge status: Home / Self Care | Payer: Medicaid Other | Source: Ambulatory Visit | Attending: Surgery | Admitting: Surgery

## 2022-04-17 ENCOUNTER — Other Ambulatory Visit: Payer: Self-pay

## 2022-04-17 VITALS — BP 157/109 | HR 89 | Temp 98.2°F | Resp 18 | Ht 64.0 in | Wt 237.0 lb

## 2022-04-17 DIAGNOSIS — E669 Obesity, unspecified: Secondary | ICD-10-CM | POA: Diagnosis not present

## 2022-04-17 DIAGNOSIS — Z01818 Encounter for other preprocedural examination: Secondary | ICD-10-CM | POA: Insufficient documentation

## 2022-04-17 DIAGNOSIS — E1169 Type 2 diabetes mellitus with other specified complication: Secondary | ICD-10-CM | POA: Insufficient documentation

## 2022-04-17 DIAGNOSIS — I517 Cardiomegaly: Secondary | ICD-10-CM | POA: Insufficient documentation

## 2022-04-17 LAB — HEMOGLOBIN A1C
Hgb A1c MFr Bld: 6.1 % — ABNORMAL HIGH (ref 4.8–5.6)
Mean Plasma Glucose: 128.37 mg/dL

## 2022-04-17 LAB — GLUCOSE, CAPILLARY: Glucose-Capillary: 113 mg/dL — ABNORMAL HIGH (ref 70–99)

## 2022-04-17 NOTE — Progress Notes (Signed)
PCP - Dr. Josephine Igo Cardiologist - Dr. Lyman Bishop- 2015  PPM/ICD - n/a Device Orders - n/a Rep Notified - n/a  Chest x-ray - 04/10/22 EKG - 04/10/22 Stress Test - 11/2013 ECHO - 07/20/14 Cardiac Cath - denies  Sleep Study - +OSA per patient. Does not wear CPAP  CBG today- 113 Patient does not check blood sugar at home and denies having diabetes even thought it is listed in her medical history. A1 C drawn today.   Blood Thinner Instructions: n/a Aspirin Instructions: n/a  ERAS Protcol - Clear liquids until 0900 day of surgery PRE-SURGERY Ensure or G2- None ordered.   COVID TEST- n/a   Anesthesia review: Yes. BP 157/109 upon arrival to PAT. Karoline Caldwell, PA made aware. Patient states she was seen in the ED on 04/10/22 for chest pain and shortness of breath. Patient states she has not taken amlodipine , or hydrochlorothiazide since being seen in the ED because she was instructed by the ED physician to hold meds for a couple of days due to dehydration. The patient states she is going to resume meds tomorrow and will check her BP daily leading up to surgery. Patient will notify PCP if BP continues to remain elevated after resuming meds. Karoline Caldwell, PA made aware.  Patient denies shortness of breath, fever, cough and chest pain at PAT appointment   All instructions explained to the patient, with a verbal understanding of the material. Patient agrees to go over the instructions while at home for a better understanding. The opportunity to ask questions was provided.

## 2022-04-18 NOTE — Anesthesia Preprocedure Evaluation (Addendum)
Anesthesia Evaluation  Patient identified by MRN, date of birth, ID band Patient awake    Reviewed: Allergy & Precautions, NPO status , Patient's Chart, lab work & pertinent test results  History of Anesthesia Complications Negative for: history of anesthetic complications  Airway Mallampati: III  TM Distance: >3 FB Neck ROM: Full    Dental  (+) Dental Advisory Given, Teeth Intact   Pulmonary asthma , sleep apnea (noncompliant) , Patient abstained from smoking., former smoker,    Pulmonary exam normal        Cardiovascular hypertension, Pt. on medications Normal cardiovascular exam   Multiple evaluations for chest pain, deemed non-cardiac    Neuro/Psych  Headaches, PSYCHIATRIC DISORDERS Anxiety Depression Bipolar Disorder  Schizophrenia    GI/Hepatic GERD  Medicated,(+)     substance abuse  marijuana use,   Endo/Other  diabetes, Type 2Morbid obesity  Renal/GU negative Renal ROS     Musculoskeletal negative musculoskeletal ROS (+)   Abdominal   Peds  Hematology negative hematology ROS (+)   Anesthesia Other Findings   Reproductive/Obstetrics  s/p hysterectomy                            Anesthesia Physical Anesthesia Plan  ASA: 3  Anesthesia Plan: General   Post-op Pain Management: Tylenol PO (pre-op)* and Celebrex PO (pre-op)*   Induction: Intravenous  PONV Risk Score and Plan: 4 or greater and Treatment may vary due to age or medical condition, Ondansetron, Midazolam and Diphenhydramine  Airway Management Planned: Oral ETT  Additional Equipment: None  Intra-op Plan:   Post-operative Plan: Extubation in OR  Informed Consent: I have reviewed the patients History and Physical, chart, labs and discussed the procedure including the risks, benefits and alternatives for the proposed anesthesia with the patient or authorized representative who has indicated his/her  understanding and acceptance.     Dental advisory given  Plan Discussed with: CRNA and Anesthesiologist  Anesthesia Plan Comments:       Anesthesia Quick Evaluation

## 2022-04-18 NOTE — Progress Notes (Signed)
Anesthesia Chart Review:  History of recurrent noncardiac chest pain with multiple benign evaluations.  She also has frequent ED visits with complaints of back and abdominal pain.  Last pain evaluation in 2015 with nonischemic stress test and echo showing normal biventricular function, normal wall motion, normal valves.  No additional testing was recommended.  Seen in the ED 04/10/2022 with complaint of migrating muscle cramps.  Per ED provider note, patient denies shortness of breath and symptoms were mild at time of evaluation.  Work-up showed mild hypokalemia potassium 3.3, slight elevation in creatinine from baseline at 1.06, normal hemoglobin, no leukocytosis, negative D-dimer, normal troponins x2, CXR with no acute findings, EKG with NSR.  She was given potassium and magnesium replacement.  Symptoms were improved at time of discharge.    History of OSA, patient does not wear CPAP.  Blood pressure noted to be elevated at prevention testing appointment.  And 157/109 on arrival.  She reports that she was recently seen in the ED and felt to be dehydrated and instructed to hold her HCTZ.  It appears she has also been holding her amlodipine.  She was instructed to restart amlodipine and check her blood pressure at home.  If blood pressure improves with resumption of amlodipine, anticipate she can proceed as planned.  If it remains elevated, she understands to call her PCP Dr. Grandville Silos for input prior to surgery.  She understands that markedly elevated blood pressure on day of surgery could be cause for cancellation.  Diet controlled DM2, A1c 6.1 on 04/17/2022.  Patient will need day of surgery evaluation.  EKG 04/10/2022: Sinus tachycardia.  Rate 108.  Nonspecific T wave abnormality.  CHEST - 2 VIEW 04/10/2022: COMPARISON:  09/06/2020   FINDINGS: Cardiac size is within normal limits. There are no signs of pulmonary edema or focal pulmonary consolidation. Left hemidiaphragm is elevated. There is no  pleural effusion or pneumothorax. Patient's chin is partially obscuring the right apex.   IMPRESSION: No active cardiopulmonary disease.  TTE 07/20/2014: - Left ventricle: The cavity size was normal. Wall thickness was    increased in a pattern of mild LVH. Systolic function was normal.    The estimated ejection fraction was in the range of 60% to 65%.    Wall motion was normal; there were no regional wall motion    abnormalities.   Nuclear stress 12/01/2013: IMPRESSION:  Normal study demonstrating no evidence of inducible myocardial  ischemia. Left ventricular function is normal with quantitative  ejection fraction calculation of 64%.    Wynonia Musty [facility] Short Stay Center/Anesthesiology Phone [phone] 04/18/2022 10:08 AM

## 2022-04-22 ENCOUNTER — Other Ambulatory Visit: Payer: Self-pay | Admitting: Family Medicine

## 2022-04-22 DIAGNOSIS — G4700 Insomnia, unspecified: Secondary | ICD-10-CM

## 2022-04-22 NOTE — Progress Notes (Signed)
left voicemail for patient with new arrival time of 0530 tomorrow, ERAS until 0430

## 2022-04-23 ENCOUNTER — Encounter (HOSPITAL_COMMUNITY): Payer: Self-pay | Admitting: Surgery

## 2022-04-23 ENCOUNTER — Other Ambulatory Visit: Payer: Self-pay

## 2022-04-23 ENCOUNTER — Ambulatory Visit (HOSPITAL_BASED_OUTPATIENT_CLINIC_OR_DEPARTMENT_OTHER): Payer: Medicaid Other | Admitting: Physician Assistant

## 2022-04-23 ENCOUNTER — Ambulatory Visit (HOSPITAL_COMMUNITY): Payer: Medicaid Other | Admitting: Physician Assistant

## 2022-04-23 ENCOUNTER — Encounter (HOSPITAL_COMMUNITY)
Admission: RE | Disposition: A | Discharge status: Home / Self Care | Payer: Self-pay | Source: Home / Self Care | Attending: Surgery

## 2022-04-23 ENCOUNTER — Inpatient Hospital Stay (HOSPITAL_COMMUNITY)
Admission: RE | Admit: 2022-04-23 | Discharge: 2022-06-21 | DRG: 003 | Disposition: A | Discharge status: Home / Self Care | Payer: Medicaid Other | Attending: Surgery | Admitting: Surgery

## 2022-04-23 DIAGNOSIS — D62 Acute posthemorrhagic anemia: Secondary | ICD-10-CM | POA: Diagnosis not present

## 2022-04-23 DIAGNOSIS — J440 Chronic obstructive pulmonary disease with acute lower respiratory infection: Secondary | ICD-10-CM | POA: Diagnosis not present

## 2022-04-23 DIAGNOSIS — D638 Anemia in other chronic diseases classified elsewhere: Secondary | ICD-10-CM | POA: Diagnosis present

## 2022-04-23 DIAGNOSIS — E875 Hyperkalemia: Secondary | ICD-10-CM | POA: Diagnosis not present

## 2022-04-23 DIAGNOSIS — K432 Incisional hernia without obstruction or gangrene: Secondary | ICD-10-CM

## 2022-04-23 DIAGNOSIS — F319 Bipolar disorder, unspecified: Secondary | ICD-10-CM | POA: Diagnosis present

## 2022-04-23 DIAGNOSIS — Z6841 Body Mass Index (BMI) 40.0 and over, adult: Secondary | ICD-10-CM

## 2022-04-23 DIAGNOSIS — J95859 Other complication of respirator [ventilator]: Secondary | ICD-10-CM | POA: Diagnosis not present

## 2022-04-23 DIAGNOSIS — Z87891 Personal history of nicotine dependence: Secondary | ICD-10-CM

## 2022-04-23 DIAGNOSIS — I2510 Atherosclerotic heart disease of native coronary artery without angina pectoris: Secondary | ICD-10-CM | POA: Diagnosis present

## 2022-04-23 DIAGNOSIS — Z8701 Personal history of pneumonia (recurrent): Secondary | ICD-10-CM

## 2022-04-23 DIAGNOSIS — R1319 Other dysphagia: Secondary | ICD-10-CM

## 2022-04-23 DIAGNOSIS — E871 Hypo-osmolality and hyponatremia: Secondary | ICD-10-CM | POA: Diagnosis not present

## 2022-04-23 DIAGNOSIS — G4730 Sleep apnea, unspecified: Secondary | ICD-10-CM | POA: Diagnosis present

## 2022-04-23 DIAGNOSIS — E872 Acidosis, unspecified: Secondary | ICD-10-CM | POA: Diagnosis not present

## 2022-04-23 DIAGNOSIS — R339 Retention of urine, unspecified: Secondary | ICD-10-CM | POA: Diagnosis not present

## 2022-04-23 DIAGNOSIS — E1169 Type 2 diabetes mellitus with other specified complication: Secondary | ICD-10-CM

## 2022-04-23 DIAGNOSIS — L8990 Pressure ulcer of unspecified site, unspecified stage: Secondary | ICD-10-CM | POA: Insufficient documentation

## 2022-04-23 DIAGNOSIS — G8929 Other chronic pain: Secondary | ICD-10-CM | POA: Diagnosis present

## 2022-04-23 DIAGNOSIS — F209 Schizophrenia, unspecified: Secondary | ICD-10-CM | POA: Diagnosis present

## 2022-04-23 DIAGNOSIS — K9189 Other postprocedural complications and disorders of digestive system: Secondary | ICD-10-CM | POA: Diagnosis not present

## 2022-04-23 DIAGNOSIS — Z781 Physical restraint status: Secondary | ICD-10-CM

## 2022-04-23 DIAGNOSIS — R9431 Abnormal electrocardiogram [ECG] [EKG]: Secondary | ICD-10-CM | POA: Clinically undetermined

## 2022-04-23 DIAGNOSIS — E1165 Type 2 diabetes mellitus with hyperglycemia: Secondary | ICD-10-CM | POA: Diagnosis present

## 2022-04-23 DIAGNOSIS — I10 Essential (primary) hypertension: Secondary | ICD-10-CM

## 2022-04-23 DIAGNOSIS — J9602 Acute respiratory failure with hypercapnia: Secondary | ICD-10-CM | POA: Diagnosis not present

## 2022-04-23 DIAGNOSIS — D72823 Leukemoid reaction: Secondary | ICD-10-CM | POA: Diagnosis not present

## 2022-04-23 DIAGNOSIS — R570 Cardiogenic shock: Secondary | ICD-10-CM | POA: Diagnosis not present

## 2022-04-23 DIAGNOSIS — T444X5A Adverse effect of predominantly alpha-adrenoreceptor agonists, initial encounter: Secondary | ICD-10-CM | POA: Diagnosis not present

## 2022-04-23 DIAGNOSIS — Z87892 Personal history of anaphylaxis: Secondary | ICD-10-CM

## 2022-04-23 DIAGNOSIS — K219 Gastro-esophageal reflux disease without esophagitis: Secondary | ICD-10-CM | POA: Diagnosis present

## 2022-04-23 DIAGNOSIS — Z885 Allergy status to narcotic agent status: Secondary | ICD-10-CM

## 2022-04-23 DIAGNOSIS — M545 Low back pain, unspecified: Secondary | ICD-10-CM | POA: Diagnosis present

## 2022-04-23 DIAGNOSIS — Z91018 Allergy to other foods: Secondary | ICD-10-CM

## 2022-04-23 DIAGNOSIS — E860 Dehydration: Secondary | ICD-10-CM | POA: Diagnosis not present

## 2022-04-23 DIAGNOSIS — T17918A Gastric contents in respiratory tract, part unspecified causing other injury, initial encounter: Secondary | ICD-10-CM | POA: Diagnosis not present

## 2022-04-23 DIAGNOSIS — K4090 Unilateral inguinal hernia, without obstruction or gangrene, not specified as recurrent: Secondary | ICD-10-CM | POA: Diagnosis present

## 2022-04-23 DIAGNOSIS — L89611 Pressure ulcer of right heel, stage 1: Secondary | ICD-10-CM | POA: Diagnosis not present

## 2022-04-23 DIAGNOSIS — E876 Hypokalemia: Secondary | ICD-10-CM | POA: Diagnosis not present

## 2022-04-23 DIAGNOSIS — J9589 Other postprocedural complications and disorders of respiratory system, not elsewhere classified: Secondary | ICD-10-CM | POA: Diagnosis not present

## 2022-04-23 DIAGNOSIS — W44F9XA Other object of natural or organic material, entering into or through a natural orifice, initial encounter: Secondary | ICD-10-CM | POA: Diagnosis not present

## 2022-04-23 DIAGNOSIS — E669 Obesity, unspecified: Secondary | ICD-10-CM | POA: Diagnosis present

## 2022-04-23 DIAGNOSIS — R6521 Severe sepsis with septic shock: Secondary | ICD-10-CM

## 2022-04-23 DIAGNOSIS — E119 Type 2 diabetes mellitus without complications: Secondary | ICD-10-CM | POA: Diagnosis present

## 2022-04-23 DIAGNOSIS — F411 Generalized anxiety disorder: Secondary | ICD-10-CM | POA: Diagnosis present

## 2022-04-23 DIAGNOSIS — Z430 Encounter for attention to tracheostomy: Secondary | ICD-10-CM

## 2022-04-23 DIAGNOSIS — E43 Unspecified severe protein-calorie malnutrition: Secondary | ICD-10-CM | POA: Diagnosis not present

## 2022-04-23 DIAGNOSIS — J45909 Unspecified asthma, uncomplicated: Secondary | ICD-10-CM | POA: Diagnosis not present

## 2022-04-23 DIAGNOSIS — Z79899 Other long term (current) drug therapy: Secondary | ICD-10-CM

## 2022-04-23 DIAGNOSIS — Z91041 Radiographic dye allergy status: Secondary | ICD-10-CM

## 2022-04-23 DIAGNOSIS — J95851 Ventilator associated pneumonia: Secondary | ICD-10-CM | POA: Diagnosis not present

## 2022-04-23 DIAGNOSIS — G9341 Metabolic encephalopathy: Secondary | ICD-10-CM | POA: Diagnosis not present

## 2022-04-23 DIAGNOSIS — K567 Ileus, unspecified: Secondary | ICD-10-CM | POA: Diagnosis not present

## 2022-04-23 DIAGNOSIS — Y95 Nosocomial condition: Secondary | ICD-10-CM | POA: Diagnosis not present

## 2022-04-23 DIAGNOSIS — D696 Thrombocytopenia, unspecified: Secondary | ICD-10-CM | POA: Diagnosis not present

## 2022-04-23 DIAGNOSIS — J9601 Acute respiratory failure with hypoxia: Secondary | ICD-10-CM

## 2022-04-23 DIAGNOSIS — I5082 Biventricular heart failure: Secondary | ICD-10-CM | POA: Diagnosis not present

## 2022-04-23 DIAGNOSIS — J690 Pneumonitis due to inhalation of food and vomit: Secondary | ICD-10-CM | POA: Diagnosis not present

## 2022-04-23 DIAGNOSIS — K469 Unspecified abdominal hernia without obstruction or gangrene: Principal | ICD-10-CM | POA: Diagnosis present

## 2022-04-23 DIAGNOSIS — F05 Delirium due to known physiological condition: Secondary | ICD-10-CM | POA: Diagnosis not present

## 2022-04-23 DIAGNOSIS — I428 Other cardiomyopathies: Secondary | ICD-10-CM | POA: Diagnosis not present

## 2022-04-23 DIAGNOSIS — B961 Klebsiella pneumoniae [K. pneumoniae] as the cause of diseases classified elsewhere: Secondary | ICD-10-CM | POA: Diagnosis not present

## 2022-04-23 DIAGNOSIS — Z833 Family history of diabetes mellitus: Secondary | ICD-10-CM

## 2022-04-23 DIAGNOSIS — M25561 Pain in right knee: Secondary | ICD-10-CM | POA: Diagnosis not present

## 2022-04-23 DIAGNOSIS — Z7409 Other reduced mobility: Secondary | ICD-10-CM

## 2022-04-23 DIAGNOSIS — K55019 Acute (reversible) ischemia of small intestine, extent unspecified: Secondary | ICD-10-CM | POA: Diagnosis not present

## 2022-04-23 DIAGNOSIS — Z98891 History of uterine scar from previous surgery: Secondary | ICD-10-CM

## 2022-04-23 DIAGNOSIS — I472 Ventricular tachycardia, unspecified: Secondary | ICD-10-CM | POA: Diagnosis not present

## 2022-04-23 DIAGNOSIS — E1141 Type 2 diabetes mellitus with diabetic mononeuropathy: Secondary | ICD-10-CM | POA: Diagnosis present

## 2022-04-23 DIAGNOSIS — I5021 Acute systolic (congestive) heart failure: Secondary | ICD-10-CM

## 2022-04-23 DIAGNOSIS — Z90710 Acquired absence of both cervix and uterus: Secondary | ICD-10-CM

## 2022-04-23 DIAGNOSIS — G4733 Obstructive sleep apnea (adult) (pediatric): Secondary | ICD-10-CM | POA: Diagnosis present

## 2022-04-23 DIAGNOSIS — A419 Sepsis, unspecified organism: Secondary | ICD-10-CM | POA: Diagnosis not present

## 2022-04-23 DIAGNOSIS — Z791 Long term (current) use of non-steroidal anti-inflammatories (NSAID): Secondary | ICD-10-CM

## 2022-04-23 DIAGNOSIS — M6281 Muscle weakness (generalized): Secondary | ICD-10-CM | POA: Diagnosis not present

## 2022-04-23 DIAGNOSIS — N736 Female pelvic peritoneal adhesions (postinfective): Secondary | ICD-10-CM | POA: Diagnosis present

## 2022-04-23 DIAGNOSIS — N170 Acute kidney failure with tubular necrosis: Secondary | ICD-10-CM | POA: Diagnosis not present

## 2022-04-23 DIAGNOSIS — E7800 Pure hypercholesterolemia, unspecified: Secondary | ICD-10-CM | POA: Diagnosis present

## 2022-04-23 DIAGNOSIS — Z992 Dependence on renal dialysis: Secondary | ICD-10-CM

## 2022-04-23 DIAGNOSIS — L89812 Pressure ulcer of head, stage 2: Secondary | ICD-10-CM | POA: Diagnosis not present

## 2022-04-23 DIAGNOSIS — I213 ST elevation (STEMI) myocardial infarction of unspecified site: Secondary | ICD-10-CM | POA: Diagnosis not present

## 2022-04-23 DIAGNOSIS — K5651 Intestinal adhesions [bands], with partial obstruction: Secondary | ICD-10-CM | POA: Diagnosis not present

## 2022-04-23 DIAGNOSIS — K5289 Other specified noninfective gastroenteritis and colitis: Secondary | ICD-10-CM | POA: Diagnosis not present

## 2022-04-23 DIAGNOSIS — R1310 Dysphagia, unspecified: Secondary | ICD-10-CM

## 2022-04-23 DIAGNOSIS — Z930 Tracheostomy status: Secondary | ICD-10-CM

## 2022-04-23 DIAGNOSIS — G43909 Migraine, unspecified, not intractable, without status migrainosus: Secondary | ICD-10-CM | POA: Diagnosis present

## 2022-04-23 HISTORY — PX: INSERTION OF MESH: SHX5868

## 2022-04-23 HISTORY — PX: XI ROBOTIC ASSISTED VENTRAL HERNIA: SHX6789

## 2022-04-23 LAB — GLUCOSE, CAPILLARY
Glucose-Capillary: 124 mg/dL — ABNORMAL HIGH (ref 70–99)
Glucose-Capillary: 90 mg/dL (ref 70–99)

## 2022-04-23 SURGERY — REPAIR, HERNIA, VENTRAL, ROBOT-ASSISTED
Anesthesia: General | Site: Abdomen

## 2022-04-23 MED ORDER — CHLORHEXIDINE GLUCONATE 0.12 % MT SOLN
15.0000 mL | Freq: Once | OROMUCOSAL | Status: AC
  Administered 2022-04-23: 15 mL via OROMUCOSAL

## 2022-04-23 MED ORDER — LACTATED RINGERS IV SOLN: INTRAVENOUS | Status: DC

## 2022-04-23 MED ORDER — KETOROLAC TROMETHAMINE 15 MG/ML IJ SOLN
INTRAMUSCULAR | Status: AC
  Filled 2022-04-23: qty 1

## 2022-04-23 MED ORDER — DIPHENHYDRAMINE HCL 50 MG/ML IJ SOLN
25.0000 mg | Freq: Four times a day (QID) | INTRAMUSCULAR | Status: DC | PRN
  Administered 2022-04-23: 25 mg via INTRAVENOUS
  Filled 2022-04-23: qty 1

## 2022-04-23 MED ORDER — CELECOXIB 200 MG PO CAPS
200.0000 mg | ORAL_CAPSULE | Freq: Once | ORAL | Status: AC
  Administered 2022-04-23: 200 mg via ORAL
  Filled 2022-04-23: qty 1

## 2022-04-23 MED ORDER — OXYCODONE HCL 5 MG PO TABS
ORAL_TABLET | ORAL | Status: AC
  Filled 2022-04-23: qty 1

## 2022-04-23 MED ORDER — PROMETHAZINE HCL 25 MG/ML IJ SOLN: 6.2500 mg | INTRAMUSCULAR | Status: DC | PRN

## 2022-04-23 MED ORDER — PROPOFOL 10 MG/ML IV BOLUS
INTRAVENOUS | Status: DC | PRN
  Administered 2022-04-23: 150 mg via INTRAVENOUS

## 2022-04-23 MED ORDER — BUPIVACAINE LIPOSOME 1.3 % IJ SUSP
INTRAMUSCULAR | Status: AC
  Filled 2022-04-23: qty 20

## 2022-04-23 MED ORDER — BUPIVACAINE HCL (PF) 0.25 % IJ SOLN
INTRAMUSCULAR | Status: AC
  Filled 2022-04-23: qty 30

## 2022-04-23 MED ORDER — BUPIVACAINE LIPOSOME 1.3 % IJ SUSP
20.0000 mL | Freq: Once | INTRAMUSCULAR | Status: DC
  Filled 2022-04-23: qty 20

## 2022-04-23 MED ORDER — DEXAMETHASONE SODIUM PHOSPHATE 10 MG/ML IJ SOLN
INTRAMUSCULAR | Status: DC | PRN
  Administered 2022-04-23: 10 mg via INTRAVENOUS

## 2022-04-23 MED ORDER — FENTANYL CITRATE (PF) 100 MCG/2ML IJ SOLN: 25.0000 ug | INTRAMUSCULAR | Status: DC | PRN

## 2022-04-23 MED ORDER — AMISULPRIDE (ANTIEMETIC) 5 MG/2ML IV SOLN
INTRAVENOUS | Status: AC
  Filled 2022-04-23: qty 4

## 2022-04-23 MED ORDER — PROPOFOL 10 MG/ML IV BOLUS
INTRAVENOUS | Status: AC
  Filled 2022-04-23: qty 20

## 2022-04-23 MED ORDER — BUPIVACAINE LIPOSOME 1.3 % IJ SUSP
INTRAMUSCULAR | Status: DC | PRN
  Administered 2022-04-23: 50 mL

## 2022-04-23 MED ORDER — CEFAZOLIN SODIUM-DEXTROSE 2-4 GM/100ML-% IV SOLN
2.0000 g | INTRAVENOUS | Status: AC
  Administered 2022-04-23: 2 g via INTRAVENOUS
  Filled 2022-04-23: qty 100

## 2022-04-23 MED ORDER — 0.9 % SODIUM CHLORIDE (POUR BTL) OPTIME
TOPICAL | Status: DC | PRN
  Administered 2022-04-23: 1000 mL

## 2022-04-23 MED ORDER — GABAPENTIN 300 MG PO CAPS
300.0000 mg | ORAL_CAPSULE | ORAL | Status: AC
  Administered 2022-04-23: 300 mg via ORAL
  Filled 2022-04-23: qty 1

## 2022-04-23 MED ORDER — MIDAZOLAM HCL 2 MG/2ML IJ SOLN
INTRAMUSCULAR | Status: DC | PRN
  Administered 2022-04-23: 2 mg via INTRAVENOUS

## 2022-04-23 MED ORDER — CHLORHEXIDINE GLUCONATE CLOTH 2 % EX PADS: 6.0000 | MEDICATED_PAD | Freq: Once | CUTANEOUS | Status: DC

## 2022-04-23 MED ORDER — HYDROMORPHONE HCL 1 MG/ML IJ SOLN
INTRAMUSCULAR | Status: DC | PRN
  Administered 2022-04-23 (×2): .5 mg via INTRAVENOUS

## 2022-04-23 MED ORDER — HYDROMORPHONE HCL 1 MG/ML IJ SOLN
INTRAMUSCULAR | Status: AC
  Filled 2022-04-23: qty 0.5

## 2022-04-23 MED ORDER — AMISULPRIDE (ANTIEMETIC) 5 MG/2ML IV SOLN
10.0000 mg | Freq: Once | INTRAVENOUS | Status: AC
  Administered 2022-04-23: 10 mg via INTRAVENOUS

## 2022-04-23 MED ORDER — SUGAMMADEX SODIUM 200 MG/2ML IV SOLN
INTRAVENOUS | Status: DC | PRN
  Administered 2022-04-23: 220 mg via INTRAVENOUS

## 2022-04-23 MED ORDER — FENTANYL CITRATE (PF) 250 MCG/5ML IJ SOLN
INTRAMUSCULAR | Status: AC
  Filled 2022-04-23: qty 5

## 2022-04-23 MED ORDER — KETOROLAC TROMETHAMINE 15 MG/ML IJ SOLN
15.0000 mg | INTRAMUSCULAR | Status: AC
  Administered 2022-04-23: 15 mg via INTRAVENOUS

## 2022-04-23 MED ORDER — ROCURONIUM BROMIDE 10 MG/ML (PF) SYRINGE
PREFILLED_SYRINGE | INTRAVENOUS | Status: DC | PRN
  Administered 2022-04-23: 40 mg via INTRAVENOUS
  Administered 2022-04-23: 30 mg via INTRAVENOUS
  Administered 2022-04-23: 40 mg via INTRAVENOUS
  Administered 2022-04-23 (×2): 20 mg via INTRAVENOUS

## 2022-04-23 MED ORDER — ENOXAPARIN SODIUM 40 MG/0.4ML IJ SOSY
40.0000 mg | PREFILLED_SYRINGE | Freq: Once | INTRAMUSCULAR | Status: AC
  Administered 2022-04-23: 40 mg via SUBCUTANEOUS
  Filled 2022-04-23: qty 0.4

## 2022-04-23 MED ORDER — OXYCODONE HCL 5 MG PO TABS
10.0000 mg | ORAL_TABLET | ORAL | Status: DC | PRN
  Administered 2022-04-23 – 2022-04-28 (×10): 10 mg via ORAL
  Filled 2022-04-23 (×11): qty 2

## 2022-04-23 MED ORDER — FENTANYL CITRATE (PF) 250 MCG/5ML IJ SOLN
INTRAMUSCULAR | Status: DC | PRN
  Administered 2022-04-23 (×2): 50 ug via INTRAVENOUS
  Administered 2022-04-23: 100 ug via INTRAVENOUS
  Administered 2022-04-23: 50 ug via INTRAVENOUS

## 2022-04-23 MED ORDER — ONDANSETRON HCL 4 MG/2ML IJ SOLN
INTRAMUSCULAR | Status: DC | PRN
  Administered 2022-04-23: 4 mg via INTRAVENOUS

## 2022-04-23 MED ORDER — MIDAZOLAM HCL 2 MG/2ML IJ SOLN
INTRAMUSCULAR | Status: AC
  Filled 2022-04-23: qty 2

## 2022-04-23 MED ORDER — PHENYLEPHRINE 80 MCG/ML (10ML) SYRINGE FOR IV PUSH (FOR BLOOD PRESSURE SUPPORT)
PREFILLED_SYRINGE | INTRAVENOUS | Status: DC | PRN
  Administered 2022-04-23: 80 ug via INTRAVENOUS

## 2022-04-23 MED ORDER — OXYCODONE HCL 5 MG PO TABS
5.0000 mg | ORAL_TABLET | ORAL | Status: DC | PRN
  Administered 2022-04-23 – 2022-04-25 (×4): 5 mg via ORAL
  Filled 2022-04-23 (×4): qty 1

## 2022-04-23 MED ORDER — ORAL CARE MOUTH RINSE: 15.0000 mL | Freq: Once | OROMUCOSAL | Status: AC

## 2022-04-23 MED ORDER — ACETAMINOPHEN 500 MG PO TABS: 1000.0000 mg | ORAL_TABLET | ORAL | Status: DC

## 2022-04-23 MED ORDER — ACETAMINOPHEN 500 MG PO TABS
1000.0000 mg | ORAL_TABLET | Freq: Once | ORAL | Status: AC
  Administered 2022-04-23: 1000 mg via ORAL
  Filled 2022-04-23: qty 2

## 2022-04-23 MED ORDER — LIDOCAINE 2% (20 MG/ML) 5 ML SYRINGE
INTRAMUSCULAR | Status: DC | PRN
  Administered 2022-04-23: 60 mg via INTRAVENOUS

## 2022-04-23 SURGICAL SUPPLY — 70 items
ADH SKN CLS APL DERMABOND .7 (GAUZE/BANDAGES/DRESSINGS) ×2
APL PRP STRL LF DISP 70% ISPRP (MISCELLANEOUS) ×2
BAG COUNTER SPONGE SURGICOUNT (BAG) IMPLANT
BAG SPNG CNTER NS LX DISP (BAG)
BINDER ABDOMINAL 10 UNV 27-48 (MISCELLANEOUS) IMPLANT
BINDER ABDOMINAL 12 ML 46-62 (SOFTGOODS) IMPLANT
CANNULA REDUC XI 12-8 STAPL (CANNULA) ×2
CANNULA REDUCER 12-8 DVNC XI (CANNULA) ×1 IMPLANT
CHLORAPREP W/TINT 26 (MISCELLANEOUS) ×2 IMPLANT
COVER MAYO STAND STRL (DRAPES) ×2 IMPLANT
COVER SURGICAL LIGHT HANDLE (MISCELLANEOUS) ×2 IMPLANT
COVER TIP SHEARS 8 DVNC (MISCELLANEOUS) ×2 IMPLANT
COVER TIP SHEARS 8MM DA VINCI (MISCELLANEOUS) ×2
DEFOGGER SCOPE WARMER CLEARIFY (MISCELLANEOUS) ×2 IMPLANT
DERMABOND ADVANCED .7 DNX12 (GAUZE/BANDAGES/DRESSINGS) ×2 IMPLANT
DEVICE SECURE STRAP 25 ABSORB (INSTRUMENTS) IMPLANT
DEVICE TROCAR PUNCTURE CLOSURE (ENDOMECHANICALS) ×2 IMPLANT
DRAPE ARM DVNC X/XI (DISPOSABLE) ×8 IMPLANT
DRAPE COLUMN DVNC XI (DISPOSABLE) ×2 IMPLANT
DRAPE CV SPLIT W-CLR ANES SCRN (DRAPES) ×2 IMPLANT
DRAPE DA VINCI XI ARM (DISPOSABLE) ×8
DRAPE DA VINCI XI COLUMN (DISPOSABLE) ×2
DRAPE ORTHO SPLIT 77X108 STRL (DRAPES) ×2
DRAPE SURG ORHT 6 SPLT 77X108 (DRAPES) ×2 IMPLANT
GLOVE BIO SURGEON STRL SZ7.5 (GLOVE) ×3 IMPLANT
GLOVE BIOGEL PI IND STRL 8 (GLOVE) ×3 IMPLANT
GOWN STRL REUS W/ TWL LRG LVL3 (GOWN DISPOSABLE) ×4 IMPLANT
GOWN STRL REUS W/ TWL XL LVL3 (GOWN DISPOSABLE) ×3 IMPLANT
GOWN STRL REUS W/TWL 2XL LVL3 (GOWN DISPOSABLE) ×2 IMPLANT
GOWN STRL REUS W/TWL LRG LVL3 (GOWN DISPOSABLE) ×4
GOWN STRL REUS W/TWL XL LVL3 (GOWN DISPOSABLE) ×4
GRASPER SUT TROCAR 14GX15 (MISCELLANEOUS) ×1 IMPLANT
IRRIG SUCT STRYKERFLOW 2 WTIP (MISCELLANEOUS)
IRRIGATION SUCT STRKRFLW 2 WTP (MISCELLANEOUS) IMPLANT
KIT BASIN OR (CUSTOM PROCEDURE TRAY) ×2 IMPLANT
KIT TURNOVER KIT B (KITS) ×2 IMPLANT
L-HOOK LAP DISP 36CM (ELECTROSURGICAL)
LHOOK LAP DISP 36CM (ELECTROSURGICAL) ×1 IMPLANT
MARKER SKIN DUAL TIP RULER LAB (MISCELLANEOUS) ×2 IMPLANT
MESH VENTRALIGHT ST 8X10 (Mesh General) ×1 IMPLANT
NDL INSUFFLATION 14GA 120MM (NEEDLE) IMPLANT
NEEDLE HYPO 22GX1.5 SAFETY (NEEDLE) ×2 IMPLANT
NEEDLE INSUFFLATION 14GA 120MM (NEEDLE) IMPLANT
PAD ARMBOARD 7.5X6 YLW CONV (MISCELLANEOUS) ×4 IMPLANT
PENCIL SMOKE EVACUATOR (MISCELLANEOUS) IMPLANT
SCISSORS LAP 5X35 DISP (ENDOMECHANICALS) IMPLANT
SEAL CANN UNIV 5-8 DVNC XI (MISCELLANEOUS) ×6 IMPLANT
SEAL XI 5MM-8MM UNIVERSAL (MISCELLANEOUS) ×6
SEALER VESSEL DA VINCI XI (MISCELLANEOUS) ×2
SEALER VESSEL EXT DVNC XI (MISCELLANEOUS) ×1 IMPLANT
SET TUBE SMOKE EVAC HIGH FLOW (TUBING) ×2 IMPLANT
SPIKE FLUID TRANSFER (MISCELLANEOUS) ×1 IMPLANT
STAPLER CANNULA SEAL DVNC XI (STAPLE) ×1 IMPLANT
STAPLER CANNULA SEAL XI (STAPLE) ×2
STOPCOCK 4 WAY LG BORE MALE ST (IV SETS) ×2 IMPLANT
SUT DVC VLOC 180 0 12IN GS21 (SUTURE)
SUT DVC VLOC 180 2-0 12IN GS21 (SUTURE) ×4
SUT ETHIBOND CT1 BRD #0 30IN (SUTURE) ×4 IMPLANT
SUT MNCRL AB 4-0 PS2 18 (SUTURE) ×2 IMPLANT
SUT STRATAFIX 1PDS 45CM VIOLET (SUTURE) ×1 IMPLANT
SUT VLOC 180 0 9IN  GS21 (SUTURE)
SUT VLOC 180 0 9IN GS21 (SUTURE) IMPLANT
SUT VLOC 180 2-0 6IN GS21 (SUTURE) IMPLANT
SUT VLOC 180 2-0 9IN GS21 (SUTURE) IMPLANT
SUTURE DVC VL 180 2-0 12INGS21 (SUTURE) ×2 IMPLANT
SUTURE DVC VLC 180 0 12IN GS21 (SUTURE) IMPLANT
TOWEL GREEN STERILE FF (TOWEL DISPOSABLE) ×2 IMPLANT
TRAY LAPAROSCOPIC MC (CUSTOM PROCEDURE TRAY) ×2 IMPLANT
TROCAR ADV FIXATION 11X100MM (TROCAR) IMPLANT
TROCAR XCEL NON-BLD 5MMX100MML (ENDOMECHANICALS) ×2 IMPLANT

## 2022-04-23 NOTE — Op Note (Signed)
Patient: Patricia [name] (1971-05-22, [id number])  Date of Surgery: 04/23/2022   Preoperative Diagnosis: Right lower abdomen spagelian hernia   Postoperative Diagnosis: Lower abdominal incisional hernia 6cm tall by 9 cm wide with right lower abdomen spagelian hernia component   Surgical Procedure: ROBOTIC INCISIONAL HERNIA REPAIR WITH MESH:  INSERTION OF MESH: 22853 (CPT)  Robotic lysis of adhesions (1 hour)  Operative Team Members:  Surgeon(s) and Role:    * Debora Stockdale, Nickola Major, MD - Primary   Anesthesiologist: Audry Pili, MD CRNA: Amadeo Garnet, CRNA; Betha Loa, CRNA   Anesthesia: General   Fluids:  Total I/O In: 1100 [I.V.:1000; IV [id number]:355] Out: -   Complications: None  Drains:  none   Specimen: None  Disposition:  PACU - hemodynamically stable.  Plan of Care: Admit for overnight observation    Indications for Procedure: Spigelian hernia  Morbid obesity with body mass index (BMI) of 40.0 to 44.9 in adult (CMS-HCC)    Ms. Scallan has an approximately 2.6 cm spigelian hernia in the right lower abdomen containing a loop of bowel. I wonder if this is from the previous Pfannenstiel incision. I recommended robotic incisional hernia repair with mesh. I recommended covering the entire Pfannenstiel incision to prevent additional hernias from developing this area. I will also plan on covering the inguinal hernia defects bilaterally. To do this I plan to use a transabdominal preperitoneal approach. She may require significant lysis of adhesion due to her previous surgical history. Discussed all of this. The procedure itself as well as its risk, benefits, and alternatives were discussed the patient in full. After full discussion all questions answered the patient granted consent to proceed.  Findings: 6cm x 9 cm fascial and muscular defect at the location of the previous Pfannenstiel incision with a spigelian component on the right side  Mesh:  Bard Ventralight ST 23cm wide by 15cm tall Mesh Location: Attempted to place a pre-peritoneal mesh, but dense adhesions and large hernia defect in the pelvis left no peritoneum to create a posterior layer.  The mesh sits in the space of retzius inferiorly and intraperitoneal superiorly.  The peritoneum is sewn to the superior border of the mesh.  The peritoneum is sewn to the mid lower portion of the mesh, tucking the inferior edge of the mesh into the space of Retzius  Hernia defect: 6cm tall x 9 cm wide   Description of Procedure:   On the date stated above the patient taken the operating room suite placed supine position but general endotracheal anesthesia was induced.  A timeout was completed verifying the correct patient, procedure, position, and equipment needed for the case.  The patient's abdomen was prepped and draped in usual sterile fashion.  I entered the abdomen using optical technique in the right abdomen.  The abdomen was insufflated to 15 mmHg.  Three 8 mm robotic trocars were placed across the supraumbilical region of the abdomen.  Eventually the right-sided trocar was upsized to a 12 mm trocar in order to insert the mesh in the abdomen.  The Davinci XI robot was brought to the bedside and the instruments were docked.  I then began the robotic portion of the case.  A meticulous lysis of adhesions was carried out to free the lower aspect of the abdominal wall.  The small bowel was densely adherent to the abdominal wall as well as the omentum.  It took about an hour to fully lyse the adhesions down past the hernia defect.  This was able to be accomplished without any injury to the intestine.  With the bowel mobilized off the abdominal wall was able to inspect the hernia defect.  It appeared that the previous Pfannenstiel incision had left a defect in the muscle and fascia of the anterior abdominal wall that measured intracorporeally to be 9 cm wide by 6 cm tall.  There was a spiculated  component to this hernia on the right side with a herniated piece of small intestine working its way through the lateral abdominal wall musculature.  This was fully reduced.  I decided to attempt a preperitoneal repair similar to an inguinal hernia repair.  I began my flap at the inferior border of the hernia defect, hoping to be able to work the superior edge down later in the case.    Dissection was carried out in the pre peritoneal space bilaterally.  The round ligament was identified and divided utilizing cautery bilaterally.  A large pre peritoneal dissection was performed to uncover the direct, indirect, femoral and obturator spaces.  I was careful to avoid injury to the bladder while dissecting the down the midline.  I did enter some of the abdominal wall musculature.  Cooper's ligament was uncovered medially and the psoas muscle uncovered laterally.  With the inferior dissection complete, it was clear the peritoneal flap was not going to be able to cover a fully preperitoneal mesh, so I decided to use a tissue separating mesh for this defect.   The mesh, as documented above, was opened and advanced into the pre peritoneal position so that it more than adequately covered the indirect, direct, femoral and obturator spaces.  The mesh laid flat, with no inferior folds and covered the entire myopectineal orifice.  The mesh was fixated with two 0-ethibond sutures to Cooper's ligament bilaterally and the linea alba in the superior midline.   2 oh V-Loc suture was used to fixate the perimeter of the mesh to the peritoneum and the lateral abdominal wall.  Superiorly, this closure was from the edge of the mesh the peritoneum, inferiorly this closure was from the diminutive peritoneal flap to the mid lower aspect of the mesh, tucking the inferior border of the mesh into the space of Retzius.  The 12 mm trocar in the right abdomen was closed using a 0 Vicryl on a PMI suture passer.  Exparel was administered in  the transversus abdominis muscle bilaterally.  The abdomen was desufflated and the skin was closed using 4-0 Monocryl and Dermabond.  All sponge needle counts were correct at the end of this case.   Louanna Raw, MD General, Bariatric, & Minimally Invasive Surgery [facility] [place]   At the end of the case we reviewed the infection status of the case. Patient: Private Patient Elective Case Case: Elective Infection Present At Time Of Surgery (PATOS): None  Louanna Raw, MD General, Bariatric, & Minimally Invasive Surgery [facility], [place]
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
## 2022-04-23 NOTE — Transfer of Care (Signed)
Immediate Anesthesia Transfer of Care Note  Patient: Patricia [name]  Procedure(s) Performed: ROBOTIC SPAGELIAN HERNIA REPAIR WITH MESH (Abdomen) INSERTION OF MESH (Abdomen)  Patient Location: PACU  Anesthesia Type:General  Level of Consciousness: drowsy and patient cooperative  Airway & Oxygen Therapy: Patient Spontanous Breathing and Patient connected to face mask oxygen  Post-op Assessment: Report given to RN, Post -op Vital signs reviewed and stable and Patient moving all extremities  Post vital signs: Reviewed and stable  Last Vitals:  Vitals Value Taken Time  BP 145/97 04/23/22 1233  Temp 36.9 C 04/23/22 1230  Pulse 94 04/23/22 1242  Resp 15 04/23/22 1242  SpO2 98 % 04/23/22 1242  Vitals shown include unvalidated device data.  Last Pain:  Vitals:   04/23/22 1230  TempSrc:   PainSc: Asleep         Complications: No notable events documented.

## 2022-04-23 NOTE — Anesthesia Procedure Notes (Signed)
Procedure Name: Intubation Date/Time: 04/23/2022 8:13 AM  Performed by: Amadeo Garnet, CRNAPre-anesthesia Checklist: Patient identified, Emergency Drugs available, Suction available and Patient being monitored Patient Re-evaluated:Patient Re-evaluated prior to induction Oxygen Delivery Method: Circle system utilized Preoxygenation: Pre-oxygenation with 100% oxygen Induction Type: IV induction Ventilation: Mask ventilation without difficulty Laryngoscope Size: Mac and 4 Grade View: Grade I Tube type: Oral Tube size: 7.0 mm Number of attempts: 1 Airway Equipment and Method: Stylet and Oral airway Placement Confirmation: ETT inserted through vocal cords under direct vision, positive ETCO2 and breath sounds checked- equal and bilateral Secured at: 22 cm Tube secured with: Tape Dental Injury: Teeth and Oropharynx as per pre-operative assessment

## 2022-04-23 NOTE — Progress Notes (Signed)
Patient arrived to [place] room room 30 alert and oriented. Pain level 3/10. 4 port sites with skin glue all clean dry and intact. Bed in lowest position. Bed alarm on. Call light in reach. Will continue to monitor pt. Clear liquid tray ordered.

## 2022-04-23 NOTE — Anesthesia Postprocedure Evaluation (Signed)
Anesthesia Post Note  Patient: Patricia [name]  Procedure(s) Performed: ROBOTIC SPAGELIAN HERNIA REPAIR WITH MESH (Abdomen) INSERTION OF MESH (Abdomen)     Patient location during evaluation: PACU Anesthesia Type: General Level of consciousness: awake and alert Pain management: pain level controlled Vital Signs Assessment: post-procedure vital signs reviewed and stable Respiratory status: spontaneous breathing, nonlabored ventilation and respiratory function stable Cardiovascular status: stable and blood pressure returned to baseline Anesthetic complications: no   No notable events documented.  Last Vitals:  Vitals:   04/23/22 1415 04/23/22 1430  BP: 124/83 (!) 133/91  Pulse: 93 95  Resp: 12 13  Temp: 36.9 C   SpO2: 97% 99%    Last Pain:  Vitals:   04/23/22 1407  TempSrc:   PainSc: (P) [place]

## 2022-04-24 ENCOUNTER — Encounter (HOSPITAL_COMMUNITY): Payer: Self-pay | Admitting: Surgery

## 2022-04-24 DIAGNOSIS — K55019 Acute (reversible) ischemia of small intestine, extent unspecified: Secondary | ICD-10-CM | POA: Diagnosis not present

## 2022-04-24 DIAGNOSIS — K432 Incisional hernia without obstruction or gangrene: Secondary | ICD-10-CM | POA: Diagnosis present

## 2022-04-24 DIAGNOSIS — J690 Pneumonitis due to inhalation of food and vomit: Secondary | ICD-10-CM | POA: Diagnosis not present

## 2022-04-24 DIAGNOSIS — R609 Edema, unspecified: Secondary | ICD-10-CM | POA: Diagnosis not present

## 2022-04-24 DIAGNOSIS — Z6841 Body Mass Index (BMI) 40.0 and over, adult: Secondary | ICD-10-CM | POA: Diagnosis not present

## 2022-04-24 DIAGNOSIS — I213 ST elevation (STEMI) myocardial infarction of unspecified site: Secondary | ICD-10-CM | POA: Diagnosis not present

## 2022-04-24 DIAGNOSIS — N170 Acute kidney failure with tubular necrosis: Secondary | ICD-10-CM | POA: Diagnosis not present

## 2022-04-24 DIAGNOSIS — I5021 Acute systolic (congestive) heart failure: Secondary | ICD-10-CM | POA: Diagnosis not present

## 2022-04-24 DIAGNOSIS — R739 Hyperglycemia, unspecified: Secondary | ICD-10-CM | POA: Diagnosis not present

## 2022-04-24 DIAGNOSIS — A419 Sepsis, unspecified organism: Secondary | ICD-10-CM | POA: Diagnosis not present

## 2022-04-24 DIAGNOSIS — J9602 Acute respiratory failure with hypercapnia: Secondary | ICD-10-CM | POA: Diagnosis not present

## 2022-04-24 DIAGNOSIS — R0602 Shortness of breath: Secondary | ICD-10-CM | POA: Diagnosis not present

## 2022-04-24 DIAGNOSIS — R6520 Severe sepsis without septic shock: Secondary | ICD-10-CM | POA: Diagnosis not present

## 2022-04-24 DIAGNOSIS — R7881 Bacteremia: Secondary | ICD-10-CM | POA: Diagnosis not present

## 2022-04-24 DIAGNOSIS — G9341 Metabolic encephalopathy: Secondary | ICD-10-CM | POA: Diagnosis not present

## 2022-04-24 DIAGNOSIS — J9610 Chronic respiratory failure, unspecified whether with hypoxia or hypercapnia: Secondary | ICD-10-CM | POA: Diagnosis not present

## 2022-04-24 DIAGNOSIS — I10 Essential (primary) hypertension: Secondary | ICD-10-CM | POA: Diagnosis present

## 2022-04-24 DIAGNOSIS — R1310 Dysphagia, unspecified: Secondary | ICD-10-CM | POA: Diagnosis not present

## 2022-04-24 DIAGNOSIS — I472 Ventricular tachycardia, unspecified: Secondary | ICD-10-CM | POA: Diagnosis not present

## 2022-04-24 DIAGNOSIS — I340 Nonrheumatic mitral (valve) insufficiency: Secondary | ICD-10-CM | POA: Diagnosis not present

## 2022-04-24 DIAGNOSIS — R570 Cardiogenic shock: Secondary | ICD-10-CM | POA: Diagnosis not present

## 2022-04-24 DIAGNOSIS — E43 Unspecified severe protein-calorie malnutrition: Secondary | ICD-10-CM | POA: Diagnosis not present

## 2022-04-24 DIAGNOSIS — K469 Unspecified abdominal hernia without obstruction or gangrene: Secondary | ICD-10-CM | POA: Diagnosis not present

## 2022-04-24 DIAGNOSIS — E1165 Type 2 diabetes mellitus with hyperglycemia: Secondary | ICD-10-CM | POA: Diagnosis present

## 2022-04-24 DIAGNOSIS — Z9911 Dependence on respirator [ventilator] status: Secondary | ICD-10-CM | POA: Diagnosis not present

## 2022-04-24 DIAGNOSIS — J95851 Ventilator associated pneumonia: Secondary | ICD-10-CM | POA: Diagnosis not present

## 2022-04-24 DIAGNOSIS — D696 Thrombocytopenia, unspecified: Secondary | ICD-10-CM | POA: Diagnosis not present

## 2022-04-24 DIAGNOSIS — Z930 Tracheostomy status: Secondary | ICD-10-CM | POA: Diagnosis not present

## 2022-04-24 DIAGNOSIS — I5040 Unspecified combined systolic (congestive) and diastolic (congestive) heart failure: Secondary | ICD-10-CM | POA: Diagnosis not present

## 2022-04-24 DIAGNOSIS — J9601 Acute respiratory failure with hypoxia: Secondary | ICD-10-CM | POA: Diagnosis not present

## 2022-04-24 DIAGNOSIS — I2102 ST elevation (STEMI) myocardial infarction involving left anterior descending coronary artery: Secondary | ICD-10-CM | POA: Diagnosis not present

## 2022-04-24 DIAGNOSIS — W44F9XA Other object of natural or organic material, entering into or through a natural orifice, initial encounter: Secondary | ICD-10-CM | POA: Diagnosis not present

## 2022-04-24 DIAGNOSIS — J189 Pneumonia, unspecified organism: Secondary | ICD-10-CM | POA: Diagnosis not present

## 2022-04-24 DIAGNOSIS — Z992 Dependence on renal dialysis: Secondary | ICD-10-CM | POA: Diagnosis not present

## 2022-04-24 DIAGNOSIS — E1169 Type 2 diabetes mellitus with other specified complication: Secondary | ICD-10-CM | POA: Diagnosis not present

## 2022-04-24 DIAGNOSIS — F209 Schizophrenia, unspecified: Secondary | ICD-10-CM | POA: Diagnosis present

## 2022-04-24 DIAGNOSIS — J440 Chronic obstructive pulmonary disease with acute lower respiratory infection: Secondary | ICD-10-CM | POA: Diagnosis not present

## 2022-04-24 DIAGNOSIS — R6521 Severe sepsis with septic shock: Secondary | ICD-10-CM | POA: Diagnosis not present

## 2022-04-24 DIAGNOSIS — E669 Obesity, unspecified: Secondary | ICD-10-CM | POA: Diagnosis not present

## 2022-04-24 DIAGNOSIS — Y95 Nosocomial condition: Secondary | ICD-10-CM | POA: Diagnosis not present

## 2022-04-24 DIAGNOSIS — N179 Acute kidney failure, unspecified: Secondary | ICD-10-CM | POA: Diagnosis not present

## 2022-04-24 DIAGNOSIS — D638 Anemia in other chronic diseases classified elsewhere: Secondary | ICD-10-CM | POA: Diagnosis present

## 2022-04-24 LAB — CBC
HCT: 35.2 % — ABNORMAL LOW (ref 36.0–46.0)
Hemoglobin: 11.4 g/dL — ABNORMAL LOW (ref 12.0–15.0)
MCH: 27 pg (ref 26.0–34.0)
MCHC: 32.4 g/dL (ref 30.0–36.0)
MCV: 83.2 fL (ref 80.0–100.0)
Platelets: 363 10*3/uL (ref 150–400)
RBC: 4.23 MIL/uL (ref 3.87–5.11)
RDW: 14.2 % (ref 11.5–15.5)
WBC: 8.7 10*3/uL (ref 4.0–10.5)
nRBC: 0 % (ref 0.0–0.2)

## 2022-04-24 LAB — CREATININE, SERUM
Creatinine, Ser: 1 mg/dL (ref 0.44–1.00)
GFR, Estimated: >60 mL/min (ref >60)

## 2022-04-24 MED ORDER — TOPIRAMATE 25 MG PO TABS
25.0000 mg | ORAL_TABLET | Freq: Every day | ORAL | Status: DC
  Administered 2022-04-24 – 2022-04-28 (×5): 25 mg via ORAL
  Filled 2022-04-24 (×7): qty 1

## 2022-04-24 MED ORDER — DIPHENHYDRAMINE HCL 50 MG/ML IJ SOLN
50.0000 mg | Freq: Four times a day (QID) | INTRAMUSCULAR | Status: DC | PRN
  Administered 2022-04-24 – 2022-05-01 (×10): 50 mg via INTRAVENOUS
  Filled 2022-04-24 (×11): qty 1

## 2022-04-24 MED ORDER — ONDANSETRON HCL 4 MG/2ML IJ SOLN
4.0000 mg | Freq: Four times a day (QID) | INTRAMUSCULAR | Status: DC | PRN
  Administered 2022-04-24 – 2022-04-28 (×5): 4 mg via INTRAVENOUS
  Filled 2022-04-24 (×6): qty 2

## 2022-04-24 MED ORDER — ACETAMINOPHEN 325 MG PO TABS
650.0000 mg | ORAL_TABLET | Freq: Four times a day (QID) | ORAL | Status: DC
  Administered 2022-04-24 – 2022-04-29 (×17): 650 mg via ORAL
  Filled 2022-04-24 (×17): qty 2

## 2022-04-24 MED ORDER — LACTATED RINGERS IV SOLN: INTRAVENOUS | Status: DC

## 2022-04-24 MED ORDER — UBROGEPANT 100 MG PO TABS: 1.0000 | ORAL_TABLET | ORAL | Status: DC | PRN

## 2022-04-24 MED ORDER — PANTOPRAZOLE SODIUM 40 MG PO TBEC
40.0000 mg | DELAYED_RELEASE_TABLET | Freq: Every day | ORAL | Status: DC | PRN
  Administered 2022-04-26: 40 mg via ORAL
  Filled 2022-04-24: qty 1

## 2022-04-24 MED ORDER — PROCHLORPERAZINE EDISYLATE 10 MG/2ML IJ SOLN
10.0000 mg | INTRAMUSCULAR | Status: DC | PRN
  Administered 2022-04-25 – 2022-04-28 (×7): 10 mg via INTRAVENOUS
  Filled 2022-04-24 (×7): qty 2

## 2022-04-24 MED ORDER — TRAZODONE HCL 50 MG PO TABS
100.0000 mg | ORAL_TABLET | Freq: Every day | ORAL | Status: DC
  Administered 2022-04-24 – 2022-04-28 (×6): 100 mg via ORAL
  Filled 2022-04-24 (×2): qty 1
  Filled 2022-04-24: qty 2
  Filled 2022-04-24 (×4): qty 1

## 2022-04-24 MED ORDER — SIMETHICONE 80 MG PO CHEW
80.0000 mg | CHEWABLE_TABLET | Freq: Four times a day (QID) | ORAL | Status: DC | PRN
  Administered 2022-04-26: 80 mg via ORAL
  Filled 2022-04-24: qty 1

## 2022-04-24 MED ORDER — DIPHENHYDRAMINE HCL 25 MG PO CAPS
25.0000 mg | ORAL_CAPSULE | Freq: Four times a day (QID) | ORAL | Status: DC | PRN
  Administered 2022-04-24: 50 mg via ORAL
  Filled 2022-04-24 (×2): qty 2

## 2022-04-24 MED ORDER — ENOXAPARIN SODIUM 40 MG/0.4ML IJ SOSY
40.0000 mg | PREFILLED_SYRINGE | INTRAMUSCULAR | Status: DC
  Administered 2022-04-24 – 2022-04-25 (×2): 40 mg via SUBCUTANEOUS
  Filled 2022-04-24 (×2): qty 0.4

## 2022-04-24 MED ORDER — MELOXICAM 7.5 MG PO TABS
15.0000 mg | ORAL_TABLET | Freq: Every day | ORAL | Status: DC
  Administered 2022-04-24 – 2022-04-27 (×4): 15 mg via ORAL
  Filled 2022-04-24 (×6): qty 2

## 2022-04-24 MED ORDER — HYDROMORPHONE HCL 1 MG/ML IJ SOLN
1.0000 mg | INTRAMUSCULAR | Status: DC | PRN
  Administered 2022-04-24 – 2022-04-29 (×9): 1 mg via INTRAVENOUS
  Filled 2022-04-24 (×10): qty 1

## 2022-04-24 MED ORDER — ALBUTEROL SULFATE (2.5 MG/3ML) 0.083% IN NEBU: 3.0000 mL | INHALATION_SOLUTION | Freq: Four times a day (QID) | RESPIRATORY_TRACT | Status: DC | PRN

## 2022-04-24 MED ORDER — HYDROXYZINE HCL 25 MG PO TABS
25.0000 mg | ORAL_TABLET | Freq: Three times a day (TID) | ORAL | Status: DC | PRN
  Administered 2022-04-24 – 2022-04-28 (×5): 25 mg via ORAL
  Filled 2022-04-24 (×7): qty 1

## 2022-04-24 MED ORDER — DOCUSATE SODIUM 100 MG PO CAPS
100.0000 mg | ORAL_CAPSULE | Freq: Two times a day (BID) | ORAL | Status: DC
  Administered 2022-04-24 – 2022-04-28 (×9): 100 mg via ORAL
  Filled 2022-04-24 (×11): qty 1

## 2022-04-24 MED ORDER — METHOCARBAMOL 1000 MG/10ML IJ SOLN
500.0000 mg | Freq: Four times a day (QID) | INTRAVENOUS | Status: DC | PRN
  Administered 2022-04-26: 500 mg via INTRAVENOUS
  Filled 2022-04-24: qty 5
  Filled 2022-04-24: qty 500

## 2022-04-24 NOTE — Progress Notes (Signed)
Mobility Specialist - Progress Note   04/24/22 1149  Mobility  Activity Ambulated with assistance in hallway  Level of Assistance Contact guard assist, steadying assist  Assistive Device Other (Comment) (HHA)  Distance Ambulated (ft) 50 ft  Activity Response Tolerated poorly  $Mobility charge 1 Mobility    Pt received in recliner agreeable to mobility. Rolled back to room in chair d/t c/o 10/10 pain and dizziness. RN notified. Left in bed w/ call bell in reach and all needs met.   Paulla Dolly Mobility Specialist

## 2022-04-24 NOTE — Plan of Care (Signed)
  Problem: Clinical Measurements: Goal: Respiratory complications will improve Outcome: Progressing   Problem: Elimination: Goal: Will not experience complications related to bowel motility Outcome: Progressing   Problem: Pain Managment: Goal: General experience of comfort will improve Outcome: Progressing   Problem: Safety: Goal: Ability to remain free from injury will improve Outcome: Progressing

## 2022-04-24 NOTE — Progress Notes (Signed)
Progress Note: General Surgery Service   Chief Complaint/Subjective: Urinating clear yellow urine yesterday, passing gas, no bowel movements.  Tolerated food.  Continued abdominal pain.  Objective: Vital signs in last 24 hours: Temp:  [97.9 F (36.6 C)-98.5 F (36.9 C)] 98.4 F (36.9 C) (09/27 0438) Pulse Rate:  [87-101] 101 (09/27 0438) Resp:  [11-18] 17 (09/26 1601) BP: (124-153)/(81-98) 134/87 (09/27 0438) SpO2:  [85 %-100 %] 94 % (09/27 0438) Weight:  [109.3 kg] 109.3 kg (09/26 0717) Last BM Date : 04/22/22  Intake/Output from previous day: 09/26 0701 - 09/27 0700 In: 1253.8 [I.V.:1153.8; IV Piggyback:100] Out: -  Intake/Output this shift: Total I/O In: 153.8 [I.V.:153.8] Out: -   GI: Abd incisions clean dry intact.  Significant abdominal tenderness on exam.;   Lab Results: CBC  Recent Labs    04/24/22 0354  WBC 8.7  HGB 11.4*  HCT 35.2*  PLT 363   BMET Recent Labs    04/24/22 0354  CREATININE 1.00   PT/INR No results for input(s): "LABPROT", "INR" in the last 72 hours. ABG No results for input(s): "PHART", "HCO3" in the last 72 hours.  Invalid input(s): "PCO2", "PO2"  Anti-infectives: Anti-infectives (From admission, onward)    Start     Dose/Rate Route Frequency Ordered Stop   04/23/22 0730  ceFAZolin (ANCEF) IVPB 2g/100 mL premix        2 g 200 mL/hr over 30 Minutes Intravenous On call to O.R. 04/23/22 [id number] 04/23/22 0175       Medications: Scheduled Meds:  acetaminophen  650 mg Oral Q6H   docusate sodium  100 mg Oral BID   enoxaparin (LOVENOX) injection  40 mg Subcutaneous Q24H   meloxicam  15 mg Oral Daily   topiramate  25 mg Oral QHS   traZODone  100 mg Oral QHS   Continuous Infusions:  lactated ringers 75 mL/hr at 04/24/22 0057   methocarbamol (ROBAXIN) IV     PRN Meds:.albuterol, diphenhydrAMINE, diphenhydrAMINE, HYDROmorphone (DILAUDID) injection, hydrOXYzine, methocarbamol (ROBAXIN) IV, ondansetron (ZOFRAN) IV, oxyCODONE,  oxyCODONE, pantoprazole, prochlorperazine, simethicone  Assessment/Plan: s/p Procedure(s): ROBOTIC SPAGELIAN HERNIA REPAIR WITH MESH INSERTION OF MESH 04/23/2022  Doing well postop day 1 after very difficult incisional hernia repair with long lysis of adhesion Tolerating diet, urinating Pain control Mobilize, has not been up walking yet.    LOS: 0 days      Felicie Morn, MD  [facility] Use [email or web address] to contact on call provider  Daily Billing: [phone] - post op

## 2022-04-25 ENCOUNTER — Telehealth: Payer: Self-pay

## 2022-04-25 LAB — BASIC METABOLIC PANEL
Anion gap: 12 (ref 5–15)
BUN: 10 mg/dL (ref 6–20)
CO2: 24 mmol/L (ref 22–32)
Calcium: 9.3 mg/dL (ref 8.9–10.3)
Chloride: 106 mmol/L (ref 98–111)
Creatinine, Ser: 0.93 mg/dL (ref 0.44–1.00)
GFR, Estimated: >60 mL/min (ref >60)
Glucose, Bld: 131 mg/dL — ABNORMAL HIGH (ref 70–99)
Potassium: 3.8 mmol/L (ref 3.5–5.1)
Sodium: 142 mmol/L (ref 135–145)

## 2022-04-25 LAB — CBC
HCT: 38.6 % (ref 36.0–46.0)
Hemoglobin: 12.3 g/dL (ref 12.0–15.0)
MCH: 26.9 pg (ref 26.0–34.0)
MCHC: 31.9 g/dL (ref 30.0–36.0)
MCV: 84.3 fL (ref 80.0–100.0)
Platelets: 361 10*3/uL (ref 150–400)
RBC: 4.58 MIL/uL (ref 3.87–5.11)
RDW: 14.3 % (ref 11.5–15.5)
WBC: 12 10*3/uL — ABNORMAL HIGH (ref 4.0–10.5)
nRBC: 0 % (ref 0.0–0.2)

## 2022-04-25 MED ORDER — ENOXAPARIN SODIUM 60 MG/0.6ML IJ SOSY
55.0000 mg | PREFILLED_SYRINGE | INTRAMUSCULAR | Status: DC
  Administered 2022-04-26 – 2022-04-30 (×4): 55 mg via SUBCUTANEOUS
  Filled 2022-04-25 (×4): qty 0.6

## 2022-04-25 MED ORDER — ALUM & MAG HYDROXIDE-SIMETH 200-200-20 MG/5ML PO SUSP
30.0000 mL | ORAL | Status: DC | PRN
  Administered 2022-04-25 – 2022-04-27 (×4): 30 mL via ORAL
  Filled 2022-04-25 (×4): qty 30

## 2022-04-25 NOTE — Progress Notes (Signed)
Patient  had x1 coffee ground vomitus,patient requested MiraLAX.,MD informed,will continue to monitor.

## 2022-04-25 NOTE — Progress Notes (Signed)
Mobility Specialist - Progress Note   04/25/22 1341  Mobility  Activity Ambulated with assistance in room  Level of Assistance Contact guard assist, steadying assist  Assistive Device Front wheel walker  Distance Ambulated (ft) 20 ft  Activity Response Tolerated well  $Mobility charge 1 Mobility    Pt received in BR agreeable to mobility in room. Declined further mobility d/t c/o nausea. Left in recliner w/ call bell in reach and all needs met.   Paulla Dolly Mobility Specialist

## 2022-04-25 NOTE — Progress Notes (Signed)
Mobility Specialist - Progress Note   04/25/22 1336  Mobility  Activity Ambulated with assistance to bathroom  Level of Assistance Contact guard assist, steadying assist  Assistive Device Front wheel walker  Distance Ambulated (ft) 10 ft  Activity Response Tolerated well  $Mobility charge 1 Mobility    Pt received in bed requesting to use BR. Required cues for RW use and hand placement. Left in BR w/ NT in room.   Paulla Dolly Mobility Specialist

## 2022-04-25 NOTE — Progress Notes (Signed)
Mobility Specialist - Progress Note   04/25/22 1145  Mobility  Activity Refused mobility    Pt in bed stating she feels nauseous, declined mobility. Will f/u as schedule allows.   Paulla Dolly Mobility Specialist

## 2022-04-25 NOTE — Progress Notes (Signed)
Progress Note  2 Days Post-Op  Subjective: Developed nausea yesterday afternoon/evening that has persisted throughout today. No emesis. She was able to eat food yesterday breakfast and lunch but has only tolerated sips of water since then and has burning similar to her baseline GERD with any PO intake. Having epigastric abdominal pain and LLQ abdominal pain. She does not feel bloated. She denies flatus or BM since prior to surgery. She ambulated in hallway yesterday. Ambulating in room today with NT assistance and use of rolling walker   Objective: Vital signs in last 24 hours: Temp:  [98.9 F (37.2 C)-100.4 F (38 C)] 99.1 F (37.3 C) (09/28 0916) Pulse Rate:  [100-114] 100 (09/28 0916) Resp:  [16-17] 17 (09/28 0916) BP: (128-135)/(81-95) 128/81 (09/28 0837) SpO2:  [93 %-99 %] 96 % (09/28 0916) Last BM Date : 04/22/22  Intake/Output from previous day: 09/27 0701 - 09/28 0700 In: 420 [P.O.:420] Out: -  Intake/Output this shift: No intake/output data recorded.  PE: General: pleasant, WD, female who is sitting up in chair in NAD HEENT: head is normocephalic, atraumatic.  Sclera are noninjected.  Pupils equal and round. EOMs intact.  Ears and nose without any masses or lesions.  Mouth is pink and moist Lungs: Respiratory effort nonlabored Abd: soft, no significant distension. Mild TTP over incision. Moderate TTP in epigastrium without rebound or guarding. Incisions c/d/I with surgical glue MSK: all 4 extremities are symmetrical with no cyanosis, clubbing, or edema. Skin: warm and dry with no masses, lesions, or rashes Psych: A&Ox3 with an appropriate affect.    Lab Results:  Recent Labs    04/24/22 0354 04/25/22 1002  WBC 8.7 12.0*  HGB 11.4* 12.3  HCT 35.2* 38.6  PLT 363 361   BMET Recent Labs    04/24/22 0354 04/25/22 1002  NA  --  142  K  --  3.8  CL  --  106  CO2  --  24  GLUCOSE  --  131*  BUN  --  10  CREATININE 1.00 0.93  CALCIUM  --  9.3    PT/INR No results for input(s): "LABPROT", "INR" in the last 72 hours. CMP     Component Value Date/Time   NA 142 04/25/2022 1002   NA 139 07/02/2014 1923   K 3.8 04/25/2022 1002   K 3.9 07/02/2014 1923   CL 106 04/25/2022 1002   CL 105 07/02/2014 1923   CO2 24 04/25/2022 1002   CO2 26 07/02/2014 1923   GLUCOSE 131 (H) 04/25/2022 1002   GLUCOSE 95 07/02/2014 1923   BUN 10 04/25/2022 1002   BUN 7 07/02/2014 1923   CREATININE 0.93 04/25/2022 1002   CREATININE 0.98 07/02/2014 1923   CALCIUM 9.3 04/25/2022 1002   CALCIUM 8.5 07/02/2014 1923   PROT 7.0 11/15/2021 1558   PROT 7.5 07/02/2014 1923   ALBUMIN 4.1 11/15/2021 1558   ALBUMIN 3.4 07/02/2014 1923   AST 14 11/15/2021 1558   AST 19 07/02/2014 1923   ALT 11 11/15/2021 1558   ALT 23 07/02/2014 1923   ALKPHOS 42 11/15/2021 1558   ALKPHOS 61 07/02/2014 1923   BILITOT 0.3 11/15/2021 1558   BILITOT 0.1 (L) 07/02/2014 1923   GFRNONAA >60 04/25/2022 1002   GFRNONAA >60 07/02/2014 1923   GFRAA >60 04/22/2018 2215   GFRAA >60 07/02/2014 1923   Lipase     Component Value Date/Time   LIPASE 33 08/19/2021 1450       Studies/Results: No results found.  Anti-infectives: Anti-infectives (From admission, onward)    Start     Dose/Rate Route Frequency Ordered Stop   04/23/22 0730  ceFAZolin (ANCEF) IVPB 2g/100 mL premix        2 g 200 mL/hr over 30 Minutes Intravenous On call to O.R. 04/23/22 0726 04/23/22 0828        Assessment/Plan s/p Procedure(s): ROBOTIC SPAGELIAN HERNIA REPAIR WITH MESH INSERTION OF MESH 04/23/2022  - WBC mildly elevated postoperatively with tmax 100.49F last 24h. Repeat am labs. - multimodal pain control - continue prn antiemetics. May be developing postoperative ileus - encouraged ambulation  FEN: CLD/breeze, LR at 75cc/h ID: ancef periop VTE: lovenox   LOS: 1 day   Winferd Humphrey, [facility] 04/25/2022, 1:24 PM Please see Amion for pager number during day  hours 7:00am-4:30pm

## 2022-04-25 NOTE — Telephone Encounter (Signed)
Spoke with Patricia [name] Regarding her overdue mammogram, she stated she is currently in the hospital and will call back when discharged.

## 2022-04-26 LAB — CBC
HCT: 36 % (ref 36.0–46.0)
Hemoglobin: 11.6 g/dL — ABNORMAL LOW (ref 12.0–15.0)
MCH: 27.1 pg (ref 26.0–34.0)
MCHC: 32.2 g/dL (ref 30.0–36.0)
MCV: 84.1 fL (ref 80.0–100.0)
Platelets: 333 10*3/uL (ref 150–400)
RBC: 4.28 MIL/uL (ref 3.87–5.11)
RDW: 14.3 % (ref 11.5–15.5)
WBC: 12.5 10*3/uL — ABNORMAL HIGH (ref 4.0–10.5)
nRBC: 0 % (ref 0.0–0.2)

## 2022-04-26 MED ORDER — KCL IN DEXTROSE-NACL 20-5-0.45 MEQ/L-%-% IV SOLN
INTRAVENOUS | Status: DC
  Filled 2022-04-26 (×2): qty 1000

## 2022-04-26 NOTE — Progress Notes (Signed)
Progress Note: General Surgery Service   Chief Complaint/Subjective: POD3 No flatus/bm Pain and nausea with some vomiting - nausea better since she vomited this morning.  Had NG in the past and does not want one if at all possible  Objective: Vital signs in last 24 hours: Temp:  [97.6 F (36.4 C)-99.5 F (37.5 C)] 97.6 F (36.4 C) (09/29 0818) Pulse Rate:  [101-110] 101 (09/29 0818) Resp:  [17-20] 18 (09/29 0818) BP: (134-145)/(87-104) 138/99 (09/29 0818) SpO2:  [93 %-95 %] 93 % (09/29 0818) Last BM Date : 04/22/22  Intake/Output from previous day: 09/28 0701 - 09/29 0700 In: 120 [P.O.:120] Out: -  Intake/Output this shift: No intake/output data recorded.  GI: Abd incisions clean dry intact.  Tenderness around incisions   Lab Results: CBC  Recent Labs    04/25/22 1002 04/26/22 0228  WBC 12.0* 12.5*  HGB 12.3 11.6*  HCT 38.6 36.0  PLT 361 333    BMET Recent Labs    04/24/22 0354 04/25/22 1002  NA  --  142  K  --  3.8  CL  --  106  CO2  --  24  GLUCOSE  --  131*  BUN  --  10  CREATININE 1.00 0.93  CALCIUM  --  9.3    PT/INR No results for input(s): "LABPROT", "INR" in the last 72 hours. ABG No results for input(s): "PHART", "HCO3" in the last 72 hours.  Invalid input(s): "PCO2", "PO2"  Anti-infectives: Anti-infectives (From admission, onward)    Start     Dose/Rate Route Frequency Ordered Stop   04/23/22 0730  ceFAZolin (ANCEF) IVPB 2g/100 mL premix        2 g 200 mL/hr over 30 Minutes Intravenous On call to O.R. 04/23/22 [id number] 04/23/22 0165       Medications: Scheduled Meds:  acetaminophen  650 mg Oral Q6H   docusate sodium  100 mg Oral BID   enoxaparin (LOVENOX) injection  55 mg Subcutaneous Q24H   meloxicam  15 mg Oral Daily   topiramate  25 mg Oral QHS   traZODone  100 mg Oral QHS   Continuous Infusions:  lactated ringers 75 mL/hr at 04/26/22 5374   methocarbamol (ROBAXIN) IV     PRN Meds:.albuterol, alum & mag hydroxide-simeth,  diphenhydrAMINE, HYDROmorphone (DILAUDID) injection, hydrOXYzine, methocarbamol (ROBAXIN) IV, ondansetron (ZOFRAN) IV, oxyCODONE, oxyCODONE, pantoprazole, prochlorperazine, simethicone  Assessment/Plan: Ms. Gural underwent difficult robotic incisional hernia repair with mesh and long lysis of adhesions on 04/23/22  Postoperative ileus - Sips of clears, IVF till return of bowel function Pain control Ambulate Incentive spirometer Monitor morning lab work.   LOS: 2 days      Felicie Morn, MD  [facility] Use [email or web address] to contact on call provider  Daily Billing: [phone] - post op

## 2022-04-26 NOTE — Progress Notes (Signed)
Mobility Specialist Progress Note:   04/26/22 1657  Mobility  Activity Refused mobility   Pt refused mobility d/t 10/10 back pain and fatigue from sitting in recliner. Will f/u as able.  Delmos Velaquez Mobility Specialist-Acute Rehab Secure Chat only

## 2022-04-26 NOTE — TOC CM/SW Note (Signed)
  Transition of Care (TOC) Screening Note   Patient Details  Name: Patricia [name] Date of Birth: 1970/12/10    Transition of Care Department [facility]) has reviewed patient and no TOC needs have been identified at this time. We will continue to monitor patient advancement through interdisciplinary progression rounds. If new patient transition needs arise, please place a TOC consult.

## 2022-04-27 LAB — CBC
HCT: 34.2 % — ABNORMAL LOW (ref 36.0–46.0)
Hemoglobin: 11 g/dL — ABNORMAL LOW (ref 12.0–15.0)
MCH: 27 pg (ref 26.0–34.0)
MCHC: 32.2 g/dL (ref 30.0–36.0)
MCV: 84 fL (ref 80.0–100.0)
Platelets: 354 10*3/uL (ref 150–400)
RBC: 4.07 MIL/uL (ref 3.87–5.11)
RDW: 14 % (ref 11.5–15.5)
WBC: 10.2 10*3/uL (ref 4.0–10.5)
nRBC: 0 % (ref 0.0–0.2)

## 2022-04-27 LAB — BASIC METABOLIC PANEL
Anion gap: 12 (ref 5–15)
BUN: 13 mg/dL (ref 6–20)
CO2: 25 mmol/L (ref 22–32)
Calcium: 9 mg/dL (ref 8.9–10.3)
Chloride: 104 mmol/L (ref 98–111)
Creatinine, Ser: 0.84 mg/dL (ref 0.44–1.00)
GFR, Estimated: >60 mL/min (ref >60)
Glucose, Bld: 152 mg/dL — ABNORMAL HIGH (ref 70–99)
Potassium: 3.5 mmol/L (ref 3.5–5.1)
Sodium: 141 mmol/L (ref 135–145)

## 2022-04-27 MED ORDER — PANTOPRAZOLE SODIUM 40 MG PO TBEC
40.0000 mg | DELAYED_RELEASE_TABLET | Freq: Two times a day (BID) | ORAL | Status: DC
  Administered 2022-04-27 – 2022-04-28 (×3): 40 mg via ORAL
  Filled 2022-04-27 (×4): qty 1

## 2022-04-27 MED ORDER — BOOST / RESOURCE BREEZE PO LIQD CUSTOM
1.0000 | Freq: Three times a day (TID) | ORAL | Status: DC
  Administered 2022-04-27 (×2): 1 via ORAL

## 2022-04-27 NOTE — Progress Notes (Addendum)
Progress Note: General Surgery Service   Chief Complaint/Subjective: POD4 Small flatus, but no bm Pain slightly improved today as well as nausea, but still with a poor appetite and not drinking much.  C/o reflux   Objective: Vital signs in last 24 hours: Temp:  [97.8 F (36.6 C)-98.6 F (37 C)] 98.6 F (37 C) (09/30 0750) Pulse Rate:  [91-110] 96 (09/30 0750) Resp:  [16-18] 16 (09/30 0750) BP: (117-136)/(80-96) 117/80 (09/30 0750) SpO2:  [95 %-97 %] 96 % (09/30 0750) Last BM Date : 04/22/22  Intake/Output from previous day: 09/29 0701 - 09/30 0700 In: 2962.4 [I.V.:2907.4; IV Piggyback:55] Out: -  Intake/Output this shift: No intake/output data recorded.  GI: Abd incisions clean dry intact.  Tenderness around incisions, hypoactive BS  Lab Results: CBC  Recent Labs    04/26/22 0228 04/27/22 0233  WBC 12.5* 10.2  HGB 11.6* 11.0*  HCT 36.0 34.2*  PLT 333 354   BMET Recent Labs    04/25/22 1002 04/27/22 0233  NA 142 141  K 3.8 3.5  CL 106 104  CO2 24 25  GLUCOSE 131* 152*  BUN 10 13  CREATININE 0.93 0.84  CALCIUM 9.3 9.0   PT/INR No results for input(s): "LABPROT", "INR" in the last 72 hours. ABG No results for input(s): "PHART", "HCO3" in the last 72 hours.  Invalid input(s): "PCO2", "PO2"  Anti-infectives: Anti-infectives (From admission, onward)    Start     Dose/Rate Route Frequency Ordered Stop   04/23/22 0730  ceFAZolin (ANCEF) IVPB 2g/100 mL premix        2 g 200 mL/hr over 30 Minutes Intravenous On call to O.R. 04/23/22 0726 04/23/22 0938       Medications: Scheduled Meds:  acetaminophen  650 mg Oral Q6H   docusate sodium  100 mg Oral BID   enoxaparin (LOVENOX) injection  55 mg Subcutaneous Q24H   feeding supplement  1 Container Oral TID BM   meloxicam  15 mg Oral Daily   pantoprazole  40 mg Oral BID   topiramate  25 mg Oral QHS   traZODone  100 mg Oral QHS   Continuous Infusions:  dextrose 5 % and 0.45 % NaCl with KCl 20 mEq/L 75  mL/hr at 04/27/22 0147   lactated ringers 75 mL/hr at 04/26/22 1829   methocarbamol (ROBAXIN) IV 500 mg (04/26/22 2137)   PRN Meds:.albuterol, alum & mag hydroxide-simeth, diphenhydrAMINE, HYDROmorphone (DILAUDID) injection, hydrOXYzine, methocarbamol (ROBAXIN) IV, ondansetron (ZOFRAN) IV, oxyCODONE, oxyCODONE, prochlorperazine, simethicone  Assessment/Plan: Ms. Demmon underwent difficult robotic incisional hernia repair with mesh and long lysis of adhesions on 04/23/22  Postoperative ileus - Sips of clears, IVF till return of bowel function Pain control Ambulate Incentive spirometer Monitor morning lab work today all stable Increase protonix to BID Add Breeze shakes   LOS: 3 days   Henreitta Cea, [facility] Use [email or web address] to contact on call provider

## 2022-04-28 ENCOUNTER — Inpatient Hospital Stay (HOSPITAL_COMMUNITY): Payer: Medicaid Other

## 2022-04-28 LAB — CBC
HCT: 33 % — ABNORMAL LOW (ref 36.0–46.0)
Hemoglobin: 10.6 g/dL — ABNORMAL LOW (ref 12.0–15.0)
MCH: 27.2 pg (ref 26.0–34.0)
MCHC: 32.1 g/dL (ref 30.0–36.0)
MCV: 84.8 fL (ref 80.0–100.0)
Platelets: 271 10*3/uL (ref 150–400)
RBC: 3.89 MIL/uL (ref 3.87–5.11)
RDW: 14.1 % (ref 11.5–15.5)
WBC: 7.5 10*3/uL (ref 4.0–10.5)
nRBC: 0 % (ref 0.0–0.2)

## 2022-04-28 LAB — BASIC METABOLIC PANEL
Anion gap: 15 (ref 5–15)
BUN: 12 mg/dL (ref 6–20)
CO2: 23 mmol/L (ref 22–32)
Calcium: 9.6 mg/dL (ref 8.9–10.3)
Chloride: 103 mmol/L (ref 98–111)
Creatinine, Ser: 0.94 mg/dL (ref 0.44–1.00)
GFR, Estimated: >60 mL/min (ref >60)
Glucose, Bld: 104 mg/dL — ABNORMAL HIGH (ref 70–99)
Potassium: 3.4 mmol/L — ABNORMAL LOW (ref 3.5–5.1)
Sodium: 141 mmol/L (ref 135–145)

## 2022-04-28 MED ORDER — POTASSIUM CHLORIDE 10 MEQ/100ML IV SOLN
10.0000 meq | INTRAVENOUS | Status: AC
  Administered 2022-04-28 (×4): 10 meq via INTRAVENOUS
  Filled 2022-04-28 (×4): qty 100

## 2022-04-28 MED ORDER — LIDOCAINE HCL URETHRAL/MUCOSAL 2 % EX GEL
1.0000 | Freq: Once | CUTANEOUS | Status: AC
  Administered 2022-04-28: 1 via TOPICAL
  Filled 2022-04-28: qty 6

## 2022-04-28 MED ORDER — BISACODYL 10 MG RE SUPP
10.0000 mg | Freq: Once | RECTAL | Status: AC
  Administered 2022-04-28: 10 mg via RECTAL
  Filled 2022-04-28: qty 1

## 2022-04-28 NOTE — Progress Notes (Signed)
Mobility Specialist: Progress Note   04/28/22 1713  Mobility  Activity Refused mobility   Pt refused mobility with c/o pain in her throat from NGT placement earlier today. Pt states she walked this morning in the hallway. Despite encouragement pt still declining. Will f/u as able.   [facility] Jonathan Corpus Mobility Specialist Mobility Specialist [address]: [phone]

## 2022-04-28 NOTE — Progress Notes (Signed)
Pt requested to go to the bathroom. Pt disconnected from suction. Assisted Pt to sit up Pt vomited a sufficient of greenish brown fluid. While up NG Tube came out while vomiting.Pt walked to the bathroom. Pt had small bowel movement. Informed Pt NG tube would have to be replaced Pt refused.MD paged to inform.

## 2022-04-28 NOTE — Progress Notes (Signed)
Progress Note: General Surgery Service   Chief Complaint/Subjective: POD 5 Small flatus, but having nausea and 1 episode of vomiting.  Doesn't want NGT.     Objective: Vital signs in last 24 hours: Temp:  [97.9 F (36.6 C)-99.3 F (37.4 C)] 99.3 F (37.4 C) (10/01 0848) Pulse Rate:  [92-98] 98 (10/01 0848) Resp:  [16-20] 16 (10/01 0848) BP: (122-136)/(79-93) 125/82 (10/01 0848) SpO2:  [93 %-97 %] 95 % (10/01 0848) Last BM Date : 04/22/22  Intake/Output from previous day: No intake/output data recorded. Intake/Output this shift: No intake/output data recorded.  GI: Abd incisions clean dry intact.  Tenderness around incisions, hypoactive BS, more distended today.  Lab Results: CBC  Recent Labs    04/27/22 0233 04/28/22 0440  WBC 10.2 7.5  HGB 11.0* 10.6*  HCT 34.2* 33.0*  PLT 354 271   BMET Recent Labs    04/27/22 0233 04/28/22 0440  NA 141 141  K 3.5 3.4*  CL 104 103  CO2 25 23  GLUCOSE 152* 104*  BUN 13 12  CREATININE 0.84 0.94  CALCIUM 9.0 9.6   PT/INR No results for input(s): "LABPROT", "INR" in the last 72 hours. ABG No results for input(s): "PHART", "HCO3" in the last 72 hours.  Invalid input(s): "PCO2", "PO2"  Anti-infectives: Anti-infectives (From admission, onward)    Start     Dose/Rate Route Frequency Ordered Stop   04/23/22 0730  ceFAZolin (ANCEF) IVPB 2g/100 mL premix        2 g 200 mL/hr over 30 Minutes Intravenous On call to O.R. 04/23/22 1610 04/23/22 9604       Medications: Scheduled Meds:  acetaminophen  650 mg Oral Q6H   bisacodyl  10 mg Rectal Once   docusate sodium  100 mg Oral BID   enoxaparin (LOVENOX) injection  55 mg Subcutaneous Q24H   feeding supplement  1 Container Oral TID BM   lidocaine  1 Application Topical Once   meloxicam  15 mg Oral Daily   pantoprazole  40 mg Oral BID   topiramate  25 mg Oral QHS   traZODone  100 mg Oral QHS   Continuous Infusions:  dextrose 5 % and 0.45 % NaCl with KCl 20 mEq/L 75  mL/hr at 04/27/22 0147   lactated ringers 75 mL/hr at 04/28/22 0933   methocarbamol (ROBAXIN) IV 500 mg (04/26/22 2137)   PRN Meds:.albuterol, alum & mag hydroxide-simeth, diphenhydrAMINE, HYDROmorphone (DILAUDID) injection, hydrOXYzine, methocarbamol (ROBAXIN) IV, ondansetron (ZOFRAN) IV, oxyCODONE, oxyCODONE, prochlorperazine, simethicone  Assessment/Plan: Ms. Orren underwent difficult robotic incisional hernia repair with mesh and long lysis of adhesions on 04/23/22  Postoperative ileus - needs NGT.  Wants to try suppository first, but may be agreeable if that doesn't help Pain control Ambulate Incentive spirometer Monitor morning lab work today all stable Increase protonix to BID    LOS: 4 days   Henreitta Cea, [facility] Use [email or web address] to contact on call provider

## 2022-04-29 ENCOUNTER — Inpatient Hospital Stay (HOSPITAL_COMMUNITY): Payer: Medicaid Other

## 2022-04-29 ENCOUNTER — Inpatient Hospital Stay: Payer: Self-pay

## 2022-04-29 ENCOUNTER — Encounter (HOSPITAL_COMMUNITY): Payer: Self-pay | Admitting: Surgery

## 2022-04-29 DIAGNOSIS — R9431 Abnormal electrocardiogram [ECG] [EKG]: Secondary | ICD-10-CM | POA: Clinically undetermined

## 2022-04-29 DIAGNOSIS — R6521 Severe sepsis with septic shock: Secondary | ICD-10-CM

## 2022-04-29 DIAGNOSIS — R609 Edema, unspecified: Secondary | ICD-10-CM | POA: Diagnosis not present

## 2022-04-29 DIAGNOSIS — I10 Essential (primary) hypertension: Secondary | ICD-10-CM | POA: Diagnosis present

## 2022-04-29 DIAGNOSIS — R6520 Severe sepsis without septic shock: Secondary | ICD-10-CM | POA: Diagnosis not present

## 2022-04-29 DIAGNOSIS — R0602 Shortness of breath: Secondary | ICD-10-CM | POA: Diagnosis not present

## 2022-04-29 DIAGNOSIS — A419 Sepsis, unspecified organism: Secondary | ICD-10-CM | POA: Diagnosis not present

## 2022-04-29 DIAGNOSIS — J690 Pneumonitis due to inhalation of food and vomit: Secondary | ICD-10-CM | POA: Diagnosis not present

## 2022-04-29 LAB — LACTIC ACID, PLASMA
Lactic Acid, Venous: 4.1 mmol/L (ref 0.5–1.9)
Lactic Acid, Venous: 5.6 mmol/L (ref 0.5–1.9)
Lactic Acid, Venous: 5.2 mmol/L (ref 0.5–1.9)

## 2022-04-29 LAB — TROPONIN I (HIGH SENSITIVITY)
Troponin I (High Sensitivity): 8 ng/L (ref <18)
Troponin I (High Sensitivity): 11 ng/L (ref <18)
Troponin I (High Sensitivity): 16 ng/L (ref <18)

## 2022-04-29 LAB — BLOOD GAS, ARTERIAL
Acid-base deficit: 6.7 mmol/L — ABNORMAL HIGH (ref 0.0–2.0)
Bicarbonate: 14.9 mmol/L — ABNORMAL LOW (ref 20.0–28.0)
Drawn by: 42783
O2 Saturation: 95 %
Patient temperature: 36.9
pCO2 arterial: 21 mmHg — ABNORMAL LOW (ref 32–48)
pH, Arterial: 7.46 — ABNORMAL HIGH (ref 7.35–7.45)
pO2, Arterial: 70 mmHg — ABNORMAL LOW (ref 83–108)

## 2022-04-29 LAB — PHOSPHORUS: Phosphorus: 2.2 mg/dL — ABNORMAL LOW (ref 2.5–4.6)

## 2022-04-29 LAB — POCT I-STAT 7, (LYTES, BLD GAS, ICA,H+H)
Acid-base deficit: 12 mmol/L — ABNORMAL HIGH (ref 0.0–2.0)
Bicarbonate: 10.3 mmol/L — ABNORMAL LOW (ref 20.0–28.0)
Calcium, Ion: 1.02 mmol/L — ABNORMAL LOW (ref 1.15–1.40)
HCT: 33 % — ABNORMAL LOW (ref 36.0–46.0)
Hemoglobin: 11.2 g/dL — ABNORMAL LOW (ref 12.0–15.0)
O2 Saturation: 96 %
Patient temperature: 97.3
Potassium: 3.7 mmol/L (ref 3.5–5.1)
Sodium: 140 mmol/L (ref 135–145)
TCO2: 11 mmol/L — ABNORMAL LOW (ref 22–32)
pCO2 arterial: 15.7 mmHg — CL (ref 32–48)
pH, Arterial: 7.424 (ref 7.35–7.45)
pO2, Arterial: 76 mmHg — ABNORMAL LOW (ref 83–108)

## 2022-04-29 LAB — CBC
HCT: 39.2 % (ref 36.0–46.0)
Hemoglobin: 12.2 g/dL (ref 12.0–15.0)
MCH: 26.6 pg (ref 26.0–34.0)
MCHC: 31.1 g/dL (ref 30.0–36.0)
MCV: 85.6 fL (ref 80.0–100.0)
Platelets: 422 10*3/uL — ABNORMAL HIGH (ref 150–400)
RBC: 4.58 MIL/uL (ref 3.87–5.11)
RDW: 14.2 % (ref 11.5–15.5)
WBC: 5.2 10*3/uL (ref 4.0–10.5)
nRBC: 0 % (ref 0.0–0.2)

## 2022-04-29 LAB — BASIC METABOLIC PANEL
Anion gap: 11 (ref 5–15)
BUN: 13 mg/dL (ref 6–20)
CO2: 21 mmol/L — ABNORMAL LOW (ref 22–32)
Calcium: 8.9 mg/dL (ref 8.9–10.3)
Chloride: 106 mmol/L (ref 98–111)
Creatinine, Ser: 0.94 mg/dL (ref 0.44–1.00)
GFR, Estimated: >60 mL/min (ref >60)
Glucose, Bld: 134 mg/dL — ABNORMAL HIGH (ref 70–99)
Potassium: 3.6 mmol/L (ref 3.5–5.1)
Sodium: 138 mmol/L (ref 135–145)

## 2022-04-29 LAB — GLUCOSE, CAPILLARY
Glucose-Capillary: 132 mg/dL — ABNORMAL HIGH (ref 70–99)
Glucose-Capillary: 112 mg/dL — ABNORMAL HIGH (ref 70–99)
Glucose-Capillary: 95 mg/dL (ref 70–99)
Glucose-Capillary: 90 mg/dL (ref 70–99)
Glucose-Capillary: 107 mg/dL — ABNORMAL HIGH (ref 70–99)

## 2022-04-29 LAB — PROCALCITONIN: Procalcitonin: 83.26 ng/mL

## 2022-04-29 LAB — MAGNESIUM: Magnesium: 2.3 mg/dL (ref 1.7–2.4)

## 2022-04-29 LAB — MRSA NEXT GEN BY PCR, NASAL: MRSA by PCR Next Gen: NOT DETECTED

## 2022-04-29 MED ORDER — VASOPRESSIN 20 UNITS/100 ML INFUSION FOR SHOCK
0.0000 [IU]/min | INTRAVENOUS | Status: DC
  Administered 2022-04-29 (×2): 0.03 [IU]/min via INTRAVENOUS
  Administered 2022-04-30 (×2): 0.04 [IU]/min via INTRAVENOUS
  Administered 2022-04-30 (×2): 0.03 [IU]/min via INTRAVENOUS
  Administered 2022-05-01 (×2): 0.04 [IU]/min via INTRAVENOUS
  Administered 2022-05-02 – 2022-05-04 (×5): 0.03 [IU]/min via INTRAVENOUS
  Filled 2022-04-29 (×13): qty 100

## 2022-04-29 MED ORDER — POTASSIUM CHLORIDE 10 MEQ/100ML IV SOLN
10.0000 meq | INTRAVENOUS | Status: AC
  Administered 2022-04-29 (×2): 10 meq via INTRAVENOUS
  Filled 2022-04-29 (×2): qty 100

## 2022-04-29 MED ORDER — PHENYLEPHRINE CONCENTRATED 100MG/250ML (0.4 MG/ML) INFUSION SIMPLE
0.0000 ug/min | INTRAVENOUS | Status: DC
  Administered 2022-04-29 – 2022-04-30 (×2): 20 ug/min via INTRAVENOUS
  Filled 2022-04-29 (×2): qty 250

## 2022-04-29 MED ORDER — NOREPINEPHRINE 4 MG/250ML-% IV SOLN
2.0000 ug/min | INTRAVENOUS | Status: DC
  Administered 2022-04-29: 3 ug/min via INTRAVENOUS
  Filled 2022-04-29 (×2): qty 250

## 2022-04-29 MED ORDER — LACTATED RINGERS IV SOLN: INTRAVENOUS | Status: DC

## 2022-04-29 MED ORDER — METHOCARBAMOL 1000 MG/10ML IJ SOLN
1000.0000 mg | Freq: Four times a day (QID) | INTRAVENOUS | Status: DC | PRN
  Administered 2022-04-29: 1000 mg via INTRAVENOUS
  Filled 2022-04-29: qty 10
  Filled 2022-04-29: qty 1000

## 2022-04-29 MED ORDER — TRAVASOL 10 % IV SOLN
INTRAVENOUS | Status: AC
  Filled 2022-04-29: qty 529.2

## 2022-04-29 MED ORDER — PIPERACILLIN-TAZOBACTAM 3.375 G IVPB
3.3750 g | Freq: Three times a day (TID) | INTRAVENOUS | Status: DC
  Administered 2022-04-29 – 2022-04-30 (×3): 3.375 g via INTRAVENOUS
  Filled 2022-04-29 (×8): qty 50

## 2022-04-29 MED ORDER — SODIUM CHLORIDE 0.9% FLUSH
10.0000 mL | Freq: Two times a day (BID) | INTRAVENOUS | Status: DC
  Administered 2022-04-29 – 2022-05-02 (×5): 10 mL
  Administered 2022-05-03 – 2022-05-04 (×2): 40 mL
  Administered 2022-05-04 – 2022-05-05 (×2): 10 mL
  Administered 2022-05-05: 40 mL
  Administered 2022-05-06 – 2022-05-09 (×7): 10 mL
  Administered 2022-05-10: 20 mL
  Administered 2022-05-10: 40 mL
  Administered 2022-05-11: 10 mL
  Administered 2022-05-11: 40 mL
  Administered 2022-05-12: 10 mL
  Administered 2022-05-12: 40 mL
  Administered 2022-05-13 – 2022-05-14 (×3): 10 mL
  Administered 2022-05-14: 20 mL
  Administered 2022-05-15 – 2022-05-21 (×12): 10 mL
  Administered 2022-05-22: 30 mL
  Administered 2022-05-22 – 2022-05-24 (×4): 10 mL
  Administered 2022-05-24: 20 mL
  Administered 2022-05-25 – 2022-05-31 (×10): 10 mL
  Administered 2022-05-31: 30 mL
  Administered 2022-06-01 – 2022-06-03 (×5): 10 mL
  Administered 2022-06-03: 20 mL
  Administered 2022-06-04: 10 mL

## 2022-04-29 MED ORDER — NOREPINEPHRINE 16 MG/250ML-% IV SOLN
0.0000 ug/min | INTRAVENOUS | Status: DC
  Administered 2022-04-29: 4 ug/min via INTRAVENOUS
  Administered 2022-04-30 (×2): 40 ug/min via INTRAVENOUS
  Administered 2022-04-30: 35 ug/min via INTRAVENOUS
  Administered 2022-05-01: 26 ug/min via INTRAVENOUS
  Administered 2022-05-01: 4 ug/min via INTRAVENOUS
  Administered 2022-05-02: 20 ug/min via INTRAVENOUS
  Filled 2022-04-29 (×8): qty 250

## 2022-04-29 MED ORDER — METHOCARBAMOL 1000 MG/10ML IJ SOLN
1000.0000 mg | Freq: Three times a day (TID) | INTRAVENOUS | Status: DC
  Filled 2022-04-29 (×2): qty 10

## 2022-04-29 MED ORDER — CHLORHEXIDINE GLUCONATE CLOTH 2 % EX PADS
6.0000 | MEDICATED_PAD | Freq: Every day | CUTANEOUS | Status: DC
  Administered 2022-04-29 – 2022-05-17 (×18): 6 via TOPICAL

## 2022-04-29 MED ORDER — STERILE WATER FOR INJECTION IV SOLN
INTRAVENOUS | Status: DC
  Filled 2022-04-29 (×2): qty 1000
  Filled 2022-04-29: qty 150
  Filled 2022-04-29 (×3): qty 1000

## 2022-04-29 MED ORDER — LACTATED RINGERS IV BOLUS (SEPSIS): 1000.0000 mL | Freq: Once | INTRAVENOUS | Status: DC

## 2022-04-29 MED ORDER — SODIUM CHLORIDE 0.9 % IV BOLUS
500.0000 mL | Freq: Once | INTRAVENOUS | Status: AC
  Administered 2022-04-29: 500 mL via INTRAVENOUS

## 2022-04-29 MED ORDER — SODIUM CHLORIDE 0.9 % IV SOLN
12.5000 mg | Freq: Once | INTRAVENOUS | Status: AC
  Administered 2022-04-29: 12.5 mg via INTRAVENOUS
  Filled 2022-04-29: qty 0.5

## 2022-04-29 MED ORDER — SODIUM CHLORIDE 0.9% FLUSH: 10.0000 mL | INTRAVENOUS | Status: DC | PRN

## 2022-04-29 MED ORDER — SODIUM BICARBONATE 8.4 % IV SOLN: 100.0000 meq | Freq: Once | INTRAVENOUS | Status: AC

## 2022-04-29 MED ORDER — PANTOPRAZOLE SODIUM 40 MG IV SOLR
40.0000 mg | Freq: Two times a day (BID) | INTRAVENOUS | Status: DC
  Administered 2022-04-29: 40 mg via INTRAVENOUS
  Filled 2022-04-29: qty 10

## 2022-04-29 MED ORDER — VANCOMYCIN HCL IN DEXTROSE 1-5 GM/200ML-% IV SOLN
1000.0000 mg | Freq: Once | INTRAVENOUS | Status: DC
  Filled 2022-04-29: qty 200

## 2022-04-29 MED ORDER — LACTATED RINGERS IV BOLUS (SEPSIS): 800.0000 mL | Freq: Once | INTRAVENOUS | Status: DC

## 2022-04-29 MED ORDER — SODIUM CHLORIDE 0.9 % IV SOLN
2.0000 g | Freq: Three times a day (TID) | INTRAVENOUS | Status: DC
  Administered 2022-04-29: 2 g via INTRAVENOUS
  Filled 2022-04-29: qty 12.5

## 2022-04-29 MED ORDER — METRONIDAZOLE 500 MG/100ML IV SOLN: 500.0000 mg | Freq: Two times a day (BID) | INTRAVENOUS | Status: DC

## 2022-04-29 MED ORDER — ACETAMINOPHEN 10 MG/ML IV SOLN
1000.0000 mg | Freq: Four times a day (QID) | INTRAVENOUS | Status: AC
  Administered 2022-04-29 – 2022-04-30 (×4): 1000 mg via INTRAVENOUS
  Filled 2022-04-29 (×4): qty 100

## 2022-04-29 MED ORDER — HYDROMORPHONE HCL 1 MG/ML IJ SOLN
0.5000 mg | INTRAMUSCULAR | Status: DC | PRN
  Administered 2022-04-29 (×2): 0.5 mg via INTRAVENOUS
  Administered 2022-04-29 – 2022-05-18 (×14): 1 mg via INTRAVENOUS
  Filled 2022-04-29 (×16): qty 1

## 2022-04-29 MED ORDER — METHOCARBAMOL 1000 MG/10ML IJ SOLN: 500.0000 mg | Freq: Four times a day (QID) | INTRAVENOUS | Status: DC | PRN

## 2022-04-29 MED ORDER — POTASSIUM PHOSPHATES 15 MMOLE/5ML IV SOLN
15.0000 mmol | Freq: Once | INTRAVENOUS | Status: AC
  Administered 2022-04-29: 15 mmol via INTRAVENOUS
  Filled 2022-04-29: qty 5

## 2022-04-29 MED ORDER — SODIUM BICARBONATE 8.4 % IV SOLN
INTRAVENOUS | Status: AC
  Administered 2022-04-29: 100 meq via INTRAVENOUS
  Filled 2022-04-29: qty 100

## 2022-04-29 MED ORDER — SODIUM CHLORIDE 0.9 % IV SOLN
250.0000 mL | INTRAVENOUS | Status: DC
  Administered 2022-04-29 – 2022-05-03 (×4): 250 mL via INTRAVENOUS

## 2022-04-29 MED ORDER — SODIUM CHLORIDE 0.9 % IV BOLUS
1000.0000 mL | Freq: Once | INTRAVENOUS | Status: AC
  Administered 2022-04-29: 1000 mL via INTRAVENOUS

## 2022-04-29 MED ORDER — SODIUM CHLORIDE 0.9 % IV BOLUS
1500.0000 mL | Freq: Once | INTRAVENOUS | Status: AC
  Administered 2022-04-29: 1500 mL via INTRAVENOUS

## 2022-04-29 MED ORDER — SODIUM CHLORIDE 0.9 % IV SOLN: INTRAVENOUS | Status: DC

## 2022-04-29 MED ORDER — INSULIN ASPART 100 UNIT/ML IJ SOLN
0.0000 [IU] | Freq: Three times a day (TID) | INTRAMUSCULAR | Status: DC
  Administered 2022-04-30 (×3): 2 [IU] via SUBCUTANEOUS

## 2022-04-29 MED ORDER — VANCOMYCIN HCL 1500 MG/300ML IV SOLN
1500.0000 mg | INTRAVENOUS | Status: DC
  Filled 2022-04-29: qty 300

## 2022-04-29 MED ORDER — PHENYLEPHRINE HCL-NACL 20-0.9 MG/250ML-% IV SOLN
0.0000 ug/min | INTRAVENOUS | Status: DC
  Filled 2022-04-29: qty 250

## 2022-04-29 MED ORDER — POTASSIUM PHOSPHATES 15 MMOLE/5ML IV SOLN
30.0000 mmol | Freq: Once | INTRAVENOUS | Status: DC
  Filled 2022-04-29: qty 10

## 2022-04-29 NOTE — Progress Notes (Signed)
Initial Nutrition Assessment  DOCUMENTATION CODES:   Not applicable  INTERVENTION:  TPN management per pharmacy Advance diet as able  NUTRITION DIAGNOSIS:   Inadequate oral intake related to inability to eat as evidenced by NPO status (SBO).  GOAL:   Patient will meet greater than or equal to 90% of their needs  MONITOR:   Diet advancement, Labs, I & O's (TPN)  REASON FOR ASSESSMENT:   Consult New TPN/TNA  ASSESSMENT:   Pt with hx of bipolar disorder, HTN, HLD, GERD, chronic bronchitis, GERD, DM type 2, and multiple prior abdominal surgeries presented to hospital for planned repair of a spigelian hernia.  9/26 - Op, ROBOTIC INCISIONAL HERNIA REPAIR WITH MESH, extensive lysis of adhesions  Unable to met with pt today x3.  After surgery, pt developed nausea and imaging suggestive of a SBO. NGT was placed and set to suction. Pt began vomiting 10/1 after being disconnected from suction and NGT was dislodged. Complained of SOB and new imaging suggestive of bilateral lower lobe opacities.   Unable to tolerate diet and surgery team requests that TPN be initiated today.   Pt had rapid response called this AM and was moved to a progressive care unit. Was moved again this afternoon to ICU for initiation of pressor support.    Average Meal Intake: 9/27: 100% intake x 1 recorded meals  Nutritionally Relevant Medications: Scheduled Meds:  docusate sodium  100 mg Oral BID   insulin aspart  0-9 Units Subcutaneous TID WC   pantoprazole IV  40 mg Intravenous Q12H   Continuous Infusions:  sodium chloride 125 mL/hr at 04/29/22 1006   ceFEPime (MAXIPIME) IV 2 g (04/29/22 1250)   TPN ADULT (ION)     vancomycin     PRN Meds: alum & mag hydroxide-simeth, diphenhydrAMINE, prochlorperazine, simethicone,  Labs Reviewed: Phosphorus 2.2  NUTRITION - FOCUSED PHYSICAL EXAM: Defer to in-person assessment  Diet Order:   Diet Order             Diet NPO time specified  Diet  effective now                   EDUCATION NEEDS:   No education needs have been identified at this time  Skin:  Skin Assessment: Reviewed RN Assessment (surgical incisions - abdomen)  Last BM:  10/1  Height:   Ht Readings from Last 1 Encounters:  04/23/22 '5\' 4"'  (1.626 m)    Weight:   Wt Readings from Last 1 Encounters:  04/23/22 109.3 kg    Ideal Body Weight:  54.5 kg  BMI:  Body mass index is 41.37 kg/m.  Estimated Nutritional Needs:  Kcal:  1800-2000 kcal/d Protein:  95-110 g/d Fluid:  1.8-2L/d    Ranell Patrick, RD, LDN Clinical Dietitian RD pager # available in [place]  After hours/weekend pager # available in [facility]

## 2022-04-29 NOTE — Progress Notes (Signed)
Pharmacy Antibiotic Note  Patricia [name] is a 51 y.o. female admitted on 04/23/2022 with pneumonia.  Pharmacy has been consulted for Cefepime dosing.  Vancomycin 1500 mg IV Q 24 hrs. Goal AUC 400-550. Expected AUC: 503 SCr used: 0.94   Plan: Start Cefepime 2 gms IV q8hr Start Vanc 1500 mg IV q24hr Monitor renal function, C&S, and clinical status. Vanc levels as needed  Height: '5\' 4"'$  (162.6 cm) Weight: 109.3 kg (241 lb) IBW/kg (Calculated) : 54.7  Temp (24hrs), Avg:98.7 F (37.1 C), Min:97.6 F (36.4 C), Max:99.8 F (37.7 C)  Recent Labs  Lab 04/24/22 0354 04/25/22 1002 04/26/22 0228 04/27/22 0233 04/28/22 0440 04/29/22 0031 04/29/22 0926  WBC 8.7 12.0* 12.5* 10.2 7.5 5.2  --   CREATININE 1.00 0.93  --  0.84 0.94 0.94  --   LATICACIDVEN  --   --   --   --   --   --  4.1*     Estimated Creatinine Clearance: 84.5 mL/min (by C-G formula based on SCr of 0.94 mg/dL).    Allergies  Allergen Reactions   Iodinated Contrast Media Shortness Of Breath, Other (See Comments) and Cough    Throat itching    Peanut-Containing Drug Products Anaphylaxis    Peanut Oil Only.  Can eat peanuts with no problems   Oxycodone-Acetaminophen Itching    Antimicrobials this admission: Cefepime 10/2 >>  Vanc 10/2 >>   Thank you for allowing pharmacy to be a part of this patient's care.  Alanda Slim, PharmD, [facility] Clinical Pharmacist Please see AMION for all Pharmacists' Contact Phone Numbers 04/29/2022, 10:55 AM

## 2022-04-29 NOTE — Progress Notes (Signed)
Called 6E RN telling them to get blood cultures ASAP and then give antibiotics. Also pt still has 1800cc fluid that needs to be given for sepsis protocol.

## 2022-04-29 NOTE — Progress Notes (Signed)
Notified bedside nurse of need to draw repeat lactic acid (#3). 

## 2022-04-29 NOTE — Assessment & Plan Note (Signed)
-  Treated with amlodipine, HCTZ as outpatient -Medications are on hold for now, particularly in light of hypotension overnight -Resume BP medications as needed

## 2022-04-29 NOTE — Procedures (Signed)
Arterial Catheter Insertion Procedure Note  Patricia [name]  [id number]  July 27, 1971  Date:04/29/22  Time:4:34 PM    Provider Performing: Jacky Kindle    Procedure: Insertion of Arterial Line [phone]) with US guidance ([id number])   Indication(s) Blood pressure monitoring and/or need for frequent ABGs  Consent Risks of the procedure as well as the alternatives and risks of each were explained to the patient and/or caregiver.  Consent for the procedure was obtained and is signed in the bedside chart  Anesthesia None   Time Out Verified patient identification, verified procedure, site/side was marked, verified correct patient position, special equipment/implants available, medications/allergies/relevant history reviewed, required imaging and test results available.   Sterile Technique Maximal sterile technique including full sterile barrier drape, hand hygiene, sterile gown, sterile gloves, mask, hair covering, sterile ultrasound probe cover (if used).   Procedure Description Area of catheter insertion was cleaned with chlorhexidine and draped in sterile fashion. With real-time ultrasound guidance an arterial catheter was placed into the right  Axillary  artery.  Appropriate arterial tracings confirmed on monitor.     Complications/Tolerance None; patient tolerated the procedure well.   EBL Minimal   Specimen(s) None

## 2022-04-29 NOTE — Progress Notes (Addendum)
6 Days Post-Op  Subjective: CC: Asked by RN to come see the patient. Seen with patient's RN and rapid response RN. Dr. Donne Hazel also came to evaluate.  Appears NG tube came out while patient was vomiting yesterday.   Tachycardic overnight.  She complained of worsening upper abdominal pain and nausea.  Had some vague central chest pain at some point overnight that she was not able to describe very well.  Unclear if any radiation of the pain.  She does report shortness of breath as well as a dry cough.  Was able to ambulate to the bathroom this morning.  She is unsure if she had any chest pain while ambulating or if the pain worsened.  She was able to void .  Became hypotensive this morning with BP 80s/60s and HR 140's. She responded well to IVF bolus with SBP now in 110's. RN reports she was confused when hypotensive briefly but this also has resolved. HR still in 140's. CT scan w/ SBO.  There is also bilateral lower lobe opacities.  Tmax 99.8. NGT placed with 1.2L output. Abdominal pain greatly improved after this. Bladder scan < 133m.  WBC 5.2 Cr 0.94  Objective: Vital signs in last 24 hours: Temp:  [97.6 F (36.4 C)-99.8 F (37.7 C)] 98.1 F (36.7 C) (10/02 0845) Pulse Rate:  [43-153] 43 (10/02 0917) Resp:  [15-40] 40 (10/02 0902) BP: (65-129)/(28-85) 88/65 (10/02 0917) SpO2:  [83 %-100 %] 98 % (10/02 0917) Last BM Date : 04/28/22  Intake/Output from previous day: 10/01 0701 - 10/02 0700 In: 1828.3 [I.V.:1478.3; IV Piggyback:350] Out: 700 [Emesis/NG output:700] Intake/Output this shift: No intake/output data recorded.  PE: Gen:  Alert, NAD, pleasant HEENT: EOM's intact Card: Tachycardic in 140's Pulm:  Rales at bases b/l Abd: Upper abdominal distension, some upper abdominal ttp without rigidity or guarding, hypoactive bs, ngt with bilious output, Incisions with glue intact appears well and are without drainage, bleeding, or signs of infection.  Ext:  Tace b/l LE  edema Neuro: non-focal. MAE's. CN 3-12 grossly intact. Equal strength in b/l UE's and LE's on my exam. Able finger to nose b/l.   Lab Results:  Recent Labs    04/28/22 0440 04/29/22 0031  WBC 7.5 5.2  HGB 10.6* 12.2  HCT 33.0* 39.2  PLT 271 422*   BMET Recent Labs    04/28/22 0440 04/29/22 0031  NA 141 138  K 3.4* 3.6  CL 103 106  CO2 23 21*  GLUCOSE 104* 134*  BUN 12 13  CREATININE 0.94 0.94  CALCIUM 9.6 8.9   PT/INR No results for input(s): "LABPROT", "INR" in the last 72 hours. CMP     Component Value Date/Time   NA 138 04/29/2022 0031   NA 139 07/02/2014 1923   K 3.6 04/29/2022 0031   K 3.9 07/02/2014 1923   CL 106 04/29/2022 0031   CL 105 07/02/2014 1923   CO2 21 (L) 04/29/2022 0031   CO2 26 07/02/2014 1923   GLUCOSE 134 (H) 04/29/2022 0031   GLUCOSE 95 07/02/2014 1923   BUN 13 04/29/2022 0031   BUN 7 07/02/2014 1923   CREATININE 0.94 04/29/2022 0031   CREATININE 0.98 07/02/2014 1923   CALCIUM 8.9 04/29/2022 0031   CALCIUM 8.5 07/02/2014 1923   PROT 7.0 11/15/2021 1558   PROT 7.5 07/02/2014 1923   ALBUMIN 4.1 11/15/2021 1558   ALBUMIN 3.4 07/02/2014 1923   AST 14 11/15/2021 1558   AST 19 07/02/2014 1923  ALT 11 11/15/2021 1558   ALT 23 07/02/2014 1923   ALKPHOS 42 11/15/2021 1558   ALKPHOS 61 07/02/2014 1923   BILITOT 0.3 11/15/2021 1558   BILITOT 0.1 (L) 07/02/2014 1923   GFRNONAA >60 04/29/2022 0031   GFRNONAA >60 07/02/2014 1923   GFRAA >60 04/22/2018 2215   GFRAA >60 07/02/2014 1923   Lipase     Component Value Date/Time   LIPASE 33 08/19/2021 1450    Studies/Results: [place] EKG SITE RITE  Result Date: 04/29/2022 If Site Rite image not attached, placement could not be confirmed due to current cardiac rhythm.  CT ABDOMEN PELVIS WO CONTRAST  Result Date: 04/29/2022 CLINICAL DATA:  51 year old female with suspected bowel obstruction. EXAM: CT ABDOMEN AND PELVIS WITHOUT CONTRAST TECHNIQUE: Multidetector CT imaging of the abdomen and  pelvis was performed following the standard protocol without IV contrast. RADIATION DOSE REDUCTION: This exam was performed according to the departmental dose-optimization program which includes automated exposure control, adjustment of the mA and/or kV according to patient size and/or use of iterative reconstruction technique. COMPARISON:  CT of the abdomen and pelvis 02/07/2022. FINDINGS: Lower chest: Opacities in the dependent portions of the lower lobes of the lungs bilaterally, may reflect atelectasis and/or developing scarring. Nasogastric tube extending into the stomach. Hepatobiliary: Diffuse low attenuation throughout the hepatic parenchyma, indicative of a background of hepatic steatosis. No definite suspicious cystic or solid hepatic lesions are confidently identified on today's noncontrast CT examination. Unenhanced appearance of the gallbladder is normal. Pancreas: No definite pancreatic mass or peripancreatic fluid collections or inflammatory changes are noted on today's noncontrast CT examination. Spleen: Unremarkable. Adrenals/Urinary Tract: Unenhanced appearance of the kidneys, bilateral adrenal glands and urinary bladder is normal. No hydroureteronephrosis. Stomach/Bowel: Nasogastric tube extending into the mid body of the stomach. Numerous dilated loops of small bowel measuring up to 4.6 cm in diameter, with multiple air-fluid levels. There is a suture line in the anterior aspect of the lower abdomen, presumably from prior partial small bowel resection. An exact transition point is not confidently identified, however, the distal ileum appears completely decompressed, suggesting a transition point in the distal small bowel. Appendix is not confidently identified may be surgically absent. Small amount of gas and stool noted in the colon. Vascular/Lymphatic: Atherosclerotic calcifications in the abdominal aorta. No definite lymphadenopathy noted in the abdomen or pelvis. Reproductive: Status post  total abdominal hysterectomy and bilateral salpingo-oophorectomy. Other: No significant volume of ascites.  No pneumoperitoneum. Musculoskeletal: Soft tissue stranding in the subcutaneous fat of the right anterior abdominal wall. There are no aggressive appearing lytic or blastic lesions noted in the visualized portions of the skeleton. IMPRESSION: 1. Findings are concerning for small bowel obstruction, likely with transition point in the distal small bowel, as above. Surgical consultation is recommended. 2. Interval development of dependent opacities in the lower lobes of the lungs bilaterally, which may reflect areas of atelectasis and/or developing scarring. 3. Hepatic steatosis. 4. Aortic atherosclerosis. 5. Additional incidental findings, as above. These results will be called to the ordering clinician or representative by the Radiologist Assistant, and communication documented in the PACS or [employer]. Electronically Signed   By: Vinnie Langton M.D.   On: 04/29/2022 08:01   DG Abd 1 View  Result Date: 04/29/2022 CLINICAL DATA:  Check gastric catheter placement EXAM: ABDOMEN - 1 VIEW COMPARISON:  None Available. FINDINGS: Gastric catheter is noted within the stomach. Persistent small bowel dilatation is noted. IMPRESSION: Gastric catheter within the stomach. Electronically Signed   By:  Inez Catalina M.D.   On: 04/29/2022 03:09   DG Abd 1 View  Result Date: 04/29/2022 CLINICAL DATA:  NG tube placement. EXAM: ABDOMEN - 1 VIEW COMPARISON:  04/28/2022. FINDINGS: There is a dilated loop of small bowel in the upper abdomen measuring up to 4.0 cm. An NG tube terminates in the stomach at the gastric fundus. No radio-opaque calculi or other significant radiographic abnormality are seen. Mild atelectasis is present at the left lung base. IMPRESSION: 1. Dilated loop of small bowel in the upper abdomen measuring up to 4 cm, possible ileus versus partial or early obstruction. 2. Enteric tube terminates in  the stomach. Electronically Signed   By: Brett Fairy M.D.   On: 04/29/2022 01:55   DG Abd Portable 1V  Result Date: 04/28/2022 CLINICAL DATA:  [id number] in G-tube placement EXAM: PORTABLE ABDOMEN - 1 VIEW COMPARISON:  September 13th 2023 radiograph FINDINGS: Magdalene Molly is not visualized, limiting assessment. Enteric tube tip terminates over the expected region of the proximal stomach. There are dilated loops of small bowel within the upper abdomen. Bibasilar atelectasis. Incomplete visualization of the pelvis. IMPRESSION: 1. Enteric tube terminates over the expected region of the proximal stomach. 2. Diffuse gaseous dilation of multiple loops of small bowel. Differential considerations include small-bowel obstruction or ileus. Consider further evaluation with CT abdomen pelvis with contrast if concern for obstruction. Electronically Signed   By: Valentino Saxon M.D.   On: 04/28/2022 17:15    Anti-infectives: Anti-infectives (From admission, onward)    Start     Dose/Rate Route Frequency Ordered Stop   04/23/22 0730  ceFAZolin (ANCEF) IVPB 2g/100 mL premix        2 g 200 mL/hr over 30 Minutes Intravenous On call to O.R. 04/23/22 0726 04/23/22 2595        Assessment/Plan POD 6 s/p robotic incisional hernia repair with mesh, LOA x1 hour for Lower abdominal incisional hernia 6cm tall by 9 cm wide with right lower abdomen spagelian hernia component by Dr. Thermon Leyland on 04/23/22 - Events noted as above. Discussed with Dr. Thermon Leyland - NGT for decompression - SBO on CT.  - Start Cefepime to cover for HCAP PNA  - Cont 1L IVF bolus, 125cc/hr after - Strict I/O. Bladder scan q4 hours if not voiding - EKG, Tn, LE dupplex. Moves to progressive. Consult to [facility] (spoke with Dr. Lorin Mercy)  FEN - NPO, NGT, IVF as above, TPN VTE - SCDs, Lovenox ID - Cefepime   LOS: 5 days    Jillyn Ledger , [facility] 04/29/2022, 9:18 AM Please see Amion for pager number during day hours  7:00am-4:30pm

## 2022-04-29 NOTE — Assessment & Plan Note (Signed)
-  Patient with significant event last night, nv/v -> probable aspiration -> sepsis -Imaging is c/w aspiration PNA -Antibiotics as above

## 2022-04-29 NOTE — Progress Notes (Signed)
Date and time results received: 04/29/22 1053 (use smartphrase ".now" to insert current time)  Test: Lactic acid Critical Value: 4.1  Name of Provider Notified: Stechschulte,MD  Orders Received? Or Actions Taken?:  waiting new orders
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
## 2022-04-29 NOTE — Progress Notes (Signed)
Spoke with 6N charge nurse about blood cultures, PICC was being inserted when lab came by told to come back for lab draw, pt also moving to higher level of care

## 2022-04-29 NOTE — Progress Notes (Signed)
Peripherally Inserted Central Catheter Placement  The IV Nurse has discussed with the patient and/or persons authorized to consent for the patient, the purpose of this procedure and the potential benefits and risks involved with this procedure.  The benefits include less needle sticks, lab draws from the catheter, and the patient may be discharged home with the catheter. Risks include, but not limited to, infection, bleeding, blood clot (thrombus formation), and puncture of an artery; nerve damage and irregular heartbeat and possibility to perform a PICC exchange if needed/ordered by physician.  Alternatives to this procedure were also discussed.  Bard Power PICC patient education guide, fact sheet on infection prevention and patient information card has been provided to patient /or left at bedside.    PICC Placement Documentation  PICC Double Lumen 57/32/25 Right Basilic 41 cm 0 cm (Active)  Indication for Insertion or Continuance of Line Administration of hyperosmolar/irritating solutions (i.e. TPN, Vancomycin, etc.) 04/29/22 1100  Exposed Catheter (cm) 0 cm 04/29/22 1100  Site Assessment Clean, Dry, Intact 04/29/22 1100  Lumen #1 Status Flushed;Saline locked;Blood return noted 04/29/22 1100  Lumen #2 Status Flushed;Saline locked;Blood return noted 04/29/22 1100  Dressing Type Transparent;Securing device 04/29/22 1100  Dressing Status Antimicrobial disc in place;Clean, Dry, Intact 04/29/22 1100  Safety Lock Not Applicable 67/20/91 9802  Line Care Connections checked and tightened 04/29/22 1100  Dressing Intervention New dressing 04/29/22 1100  Dressing Change Due 05/06/22 04/29/22 [place], [place] 04/29/2022, 11:19 AM

## 2022-04-29 NOTE — Progress Notes (Signed)
Asked to see again by Dr. Thermon Leyland after he was notified patient became hypotensive again.  On my arrival (and RR RN) to see the patient BP 90s/60s with heart rate in the 140s.  She still complains of some shortness of breath.  No chest pain.  O2 sats noted to be in upper 80s on RA.  Placed on 2 L with improvement. She appears tachypneic and still has coarse breath sounds at bilateral bases. Already on cefepime. ABG ordered. She has not had LE duplex done yet. She reports allergy to IV contrast and also does not look stable for CT at this time. She notes her abdominal pain is better after NG tube decompression.  At least 2 L out from NG tube since placement. On exam her abdomen remains moderately distended but soft without rigidity or guarding. Cont NGT to LIWS. She has not voided since this morning.  Bladder scan x2 done at bedside with <50cc. She has received ~2L fluid and has ~1L IVF bolus remaining. BP cycled and 70's/50's. Cont IVF bolus and strict I/O. Ordered placed for transfer to ICU and Levophed. Noted prolonged QTc on prior ekg. Repeat EKG pending. Dicsussed with pharmacy who recommended proceeding with Levophed. Dr. Thermon Leyland has also come to evaluate the patient and agree's with plan.   Alferd Apa, [facility] Surgery 04/29/22, 1:48 PM

## 2022-04-29 NOTE — Assessment & Plan Note (Signed)
-  Recent A1c was 6.1, indicating good control -She is not on home medications -Will add sensitive-scale SSI for improved wound healing

## 2022-04-29 NOTE — Progress Notes (Signed)
Pharmacy Antibiotic Note  Patricia [name] is a 51 y.o. female admitted on 04/23/2022 with pneumonia.  Pharmacy has been consulted for Cefepime dosing.  Plan: Start Cefepime 2 gms IV q8hr Monitor renal function, C&S, and clinical status  Height: '5\' 4"'$  (162.6 cm) Weight: 109.3 kg (241 lb) IBW/kg (Calculated) : 54.7  Temp (24hrs), Avg:98.7 F (37.1 C), Min:97.6 F (36.4 C), Max:99.8 F (37.7 C)  Recent Labs  Lab 04/24/22 0354 04/25/22 1002 04/26/22 0228 04/27/22 0233 04/28/22 0440 04/29/22 0031  WBC 8.7 12.0* 12.5* 10.2 7.5 5.2  CREATININE 1.00 0.93  --  0.84 0.94 0.94    Estimated Creatinine Clearance: 84.5 mL/min (by C-G formula based on SCr of 0.94 mg/dL).    Allergies  Allergen Reactions   Iodinated Contrast Media Shortness Of Breath, Other (See Comments) and Cough    Throat itching    Peanut-Containing Drug Products Anaphylaxis    Peanut Oil Only.  Can eat peanuts with no problems   Oxycodone-Acetaminophen Itching    Antimicrobials this admission: Cefepime 10/2 >>    Thank you for allowing pharmacy to be a part of this patient's care.  Alanda Slim, PharmD, [facility] Clinical Pharmacist Please see AMION for all Pharmacists' Contact Phone Numbers 04/29/2022, 9:56 AM

## 2022-04-29 NOTE — Progress Notes (Signed)
Report called to [facility] spoke with Ray,RN who will be taking care of her in room when she arrives. Nurse verbalized understanding of report and had no further questions

## 2022-04-29 NOTE — Assessment & Plan Note (Signed)
-  Patient with complicated hernia repair and post-operative ileus -NG tube came out and she developed n/v -> probable aspiration -SIRS criteria in this patient includes: Tachycardia, tachypnea, hypoxia  -Patient has evidence of acute organ failure with elevated lactate >2;recurrent hypotension (SBP < 90 or MAP < 65 x 2 readings) that is not easily explained by another condition. -While awaiting blood cultures, this appears to be severe sepsis - likely source is intra-abdominal and/or aspiration PNA -Sepsis protocol initiated -Patient had initial lactate >4 or SBP <90/MAP <65 and so has received the 30 cc/kg IVF bolus. -Blood and urine cultures pending -Will transfer to progressive care -Treat with IV Cefepime/Vanc/Flagyl (MRSA swab ordered and if negative can likely dc Vanc) -Will trend lactate to ensure improvement -Will order procalcitonin level. As the procalcitonin level normalizes, it will be reasonable to consider de-escalation of antibiotic coverage.

## 2022-04-29 NOTE — Assessment & Plan Note (Signed)
-  Takes cyclobenzaprine as an outpatient -Avoid narcotics if possible given hypotension associated with sepsis -Will give IV Robaxin for now -I have reviewed this patient in the [place] Controlled Substances Reporting System.  She is NOT receiving routine controlled medications -She is not at particularly high risk of opioid misuse or diversion but is at increased risk for overdose.

## 2022-04-29 NOTE — Progress Notes (Signed)
Power flush performed to attempt to correct PICC placement out of azygous vein. Second xry ordered.

## 2022-04-29 NOTE — Progress Notes (Signed)
PHARMACY - TOTAL PARENTERAL NUTRITION CONSULT NOTE   Indication: Small bowel obstruction  Patient Measurements: Height: '5\' 4"'$  (162.6 cm) Weight: 109.3 kg (241 lb) IBW/kg (Calculated) : 54.7 TPN AdjBW (KG): 68.4 Body mass index is 41.37 kg/m.   Assessment: 51 yo female s/p difficult incisional hernia repair with long lysis of adhesion on 9/26. Who has developed a SBO based on CT scan 10/2.  Poor po intake since surgery  due to abdominal pain.  Pharmacy consulted to start TPN.  Glucose / Insulin: No history of DM. CBGs WNL Electrolytes: Na 138, K 3.6 Renal: Scr 0.94. BUN 13 Hepatic: Will be checked tomorrow am Intake / Output; MIVF: Urine occurrences x 3, NG output 100/ml GI Imaging: 10/2 CT Scan - SBO GI Surgeries / Procedures: Not since TPN initiated  Central access: PICC ordered to be placed 10/2 TPN start date: 10/2  Nutritional Goals: Goal TPN rate is 85 mL/hr (provides 100 g of protein and 1900 kcals per day)  RD Assessment: 10/2  Energy 1800-2000 kcal/d Protein 95-110 g/d Fluid >/=2L/d  Current Nutrition:  NPO  Plan:  Start TPN at 31m/hr at 1800 Electrolytes in TPN: Na 575m/L, K 5067mL, Ca 5mE27m, Mg 5mEq8m and Phos 15mmo76m Cl:Ac 1:1 Add standard MVI and trace elements to TPN Kcl 10 meq/100 x 2 (before PICC placed) Initiate Sensitive q4h SSI and adjust as needed  Reduce MIVF of LR to 30  mL/hr at 1800 Monitor TPN labs on Mon/Thurs, and daily until stabilized  Cathy Alanda SlimmD, FCCM [facility] Pharmacist Please see AMION for all Pharmacists' Contact Phone Numbers 04/29/2022, 9:14 AM

## 2022-04-29 NOTE — Assessment & Plan Note (Addendum)
-  Patient is s/p complicated hernia repair -Now with apparent SBO -Has NG tube in place which is draining a large volume of brown liquid -Management per surgery

## 2022-04-29 NOTE — Progress Notes (Signed)
Pt transported down to CT via bed by transportation staff

## 2022-04-29 NOTE — Assessment & Plan Note (Signed)
-  Also reported h/o schizophrenia but not on antipsychotics so that diagnosis is suspect -Previously on Topamax, not currently taking -Continue Trazodone

## 2022-04-29 NOTE — Assessment & Plan Note (Signed)
-  Body mass index is 41.37 kg/m..  -Weight loss should be encouraged -Outpatient PCP/bariatric medicine f/u encouraged -Obesity is associated with increased morbidity/mortality

## 2022-04-29 NOTE — Progress Notes (Signed)
  Notified provider and bedside nurse of need to order and administer fluid bolus patient needs 3279 cc .

## 2022-04-29 NOTE — Progress Notes (Signed)
New xray results show PICC "tip now projecting at the level of the superior cavoatrial junction" which is ideal placement. PICC ready for immediate use at this time.

## 2022-04-29 NOTE — Progress Notes (Signed)
Elink following code sepsis °
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
## 2022-04-29 NOTE — Progress Notes (Addendum)
eLink Physician-Brief Progress Note Patient Name: Patricia [name] DOB: 13-Oct-1970 MRN: [id number]   Date of Service  04/29/2022  HPI/Events of Note  Notified of tachycardia with rate in the 140s.   Pt is a 51/F who is s/p robotic hernia repair complicated with post-op ileus and aspiration.  Pt is awake and alert, remains on vasopressin and levophed.  She is tachypneic.   BP 92/59, HR 140s, RR 30s-40s. ABG 7.424/15.7/76 Lactate 5.2  eICU Interventions  Start on neosynephrine and titrate levophed down to off.  Continue vasopressin.  Check lactic acid now.      Intervention Category Intermediate Interventions: Arrhythmia - evaluation and management  Elsie Lincoln 04/29/2022, 9:34 PM  1:26 AM Lactic acid tending up from 5.2 --> 7.1.  Plan> Increase HCO3 gtt to 125cc/hr.  Get ABG.   Pt is at risk for intubation.

## 2022-04-29 NOTE — Significant Event (Addendum)
Rapid Response Event Note   Reason for Call :  Hypotension, tachycardia, lethargy  Initial Focused Assessment:  Patient in bed c/o "cramping in chest and neck" as well as discomfort in upper abdomen. Patient slightly confused. Patient tachypneic, shallow breathing with RR of 40. NGT in place (approximately 1L emptied recently).   Abdomen distended and soft, minimal bowel sounds NGT with yellow/brown clear 238m output since arrival of call Heart sounds normal, lung sounds diminished Bladder Scan "greater than 76cc"  VS: BP: 65/28 (37), HR 133, O2 97% RA, temp 98.1  Interventions:  2nd IV access 1L NS bolus, NS '@125ml'$ /hr to follow after NGT flushed  Labs Frequent VS/BP PA/MD to bedside  Reassessment: VS: BP: 87/59 (69), HR 138, O2 96% RA, RR 34 MD J. Yates at bedside, ordered additional NS bolus 5072m  Plan of Care:  Transfer to Progressive care Recheck BP within 1hr after IVF bolus finishes (approximately 1100)  Event Summary:   MD Notified: M.Evette CristalA/M. WaDonne HazelD/P. Stechshulte MD Call Time: 08[place]ime: 0840 End Time:  1042   ADDENDUM After arrival on 6E, patient became more hypotensive and fatigued VS: BP 73/44 (52), HR 143, RR , O2 91%--placed on 2L [place] improved to 93% Completed IVF bolus per sepsis protocol IVT at bedside to adjust PICC, waiting for XR to confirm placement Patient transfer to ICU 41M 09  MaNewman NickelsRN

## 2022-04-29 NOTE — Progress Notes (Signed)
   04/29/22 0508  Assess: MEWS Score  Temp 99.8 F (37.7 C)  BP 111/79  MAP (mmHg) 89  Pulse Rate (!) 153  Resp (!) 26  Level of Consciousness Alert  SpO2 98 %  O2 Device Room Air  Assess: MEWS Score  MEWS Temp 0  MEWS Systolic 0  MEWS Pulse 3  MEWS RR 2  MEWS LOC 0  MEWS Score 5  MEWS Score Color Red  Assess: if the MEWS score is Yellow or Red  Were vital signs taken at a resting state? Yes  Focused Assessment Change from prior assessment (see assessment flowsheet)  Does the patient meet 2 or more of the SIRS criteria? Yes  Does the patient have a confirmed or suspected source of infection? No  MEWS guidelines implemented *See Row Information* Yes  Treat  MEWS Interventions Administered prn meds/treatments;Administered scheduled meds/treatments  Pain Scale 0-10  Pain Score 6  Pain Type Acute pain  Pain Location Back  Pain Descriptors / Indicators Aching  Pain Frequency Intermittent  Pain Onset Sudden  Patients Stated Pain Goal 0  Pain Intervention(s) Medication (See eMAR);Repositioned;Cold applied  Multiple Pain Sites No  Complains of Nausea /  Vomiting  Nausea relieved by Antiemetic  Take Vital Signs  Increase Vital Sign Frequency  Red: Q 1hr X 4 then Q 4hr X 4, if remains red, continue Q 4hrs  Escalate  MEWS: Escalate Red: discuss with charge nurse/RN and provider, consider discussing with RRT  Notify: Charge Nurse/RN  Name of Charge Nurse/RN Notified Josephine RN  Date Charge Nurse/RN Notified 04/29/22  Time Charge Nurse/RN Notified 7034  Notify: Provider  Provider Name/Title Ralene Ok, MD  Date Provider Notified 04/29/22  Time Provider Notified [phone]  Method of Notification Call  Notification Reason Change in status  Provider response No new orders  Date of Provider Response 04/29/22  Time of Provider Response 0604  Notify: Rapid Response  Name of Rapid Response RN Notified Toula Moos, RN  Date Rapid Response Notified 04/29/22  Time Rapid Response  Notified 0508  Document  Progress note created (see row info) Yes  Assess: SIRS CRITERIA  SIRS Temperature  0  SIRS Pulse 1  SIRS Respirations  1  SIRS WBC 1  SIRS Score Sum  3

## 2022-04-29 NOTE — Assessment & Plan Note (Signed)
-  Likely associated with dehydration/sepsis, anticipate resolution once volume status is normalized -Will attempt to avoid QT-prolonging medications such as PPI, nausea meds, SSRIs -Repeat EKG now and in AM

## 2022-04-29 NOTE — Progress Notes (Signed)
VASCULAR LAB    Bilateral lower extremity venous duplex has been performed.  See CV proc for preliminary results.   Haden Suder, RVT 04/29/2022, 3:40 PM

## 2022-04-29 NOTE — Progress Notes (Signed)
Pt called from room stating she is vomiting came to the room. Pt vomiting up green and brown fluid. Spoke with Pt about putting the NG Tube back. Pt agreed to put NG tube back. NG Tube replaced KUB ordered for placement. MD notified verbal order for PRN med given. Pt tolerated medication. Pt stated she had relief with this medication.

## 2022-04-29 NOTE — Progress Notes (Signed)
Notified bedside nurse of need to draw blood cultures then give antibiotics

## 2022-04-30 ENCOUNTER — Inpatient Hospital Stay (HOSPITAL_COMMUNITY): Payer: Medicaid Other

## 2022-04-30 DIAGNOSIS — R6520 Severe sepsis without septic shock: Secondary | ICD-10-CM | POA: Diagnosis not present

## 2022-04-30 DIAGNOSIS — E669 Obesity, unspecified: Secondary | ICD-10-CM

## 2022-04-30 DIAGNOSIS — R7881 Bacteremia: Secondary | ICD-10-CM

## 2022-04-30 DIAGNOSIS — E1169 Type 2 diabetes mellitus with other specified complication: Secondary | ICD-10-CM

## 2022-04-30 DIAGNOSIS — A419 Sepsis, unspecified organism: Secondary | ICD-10-CM | POA: Diagnosis not present

## 2022-04-30 LAB — COMPREHENSIVE METABOLIC PANEL
ALT: 48 U/L — ABNORMAL HIGH (ref 0–44)
ALT: 293 U/L — ABNORMAL HIGH (ref 0–44)
AST: 84 U/L — ABNORMAL HIGH (ref 15–41)
AST: 513 U/L — ABNORMAL HIGH (ref 15–41)
Albumin: 2.1 g/dL — ABNORMAL LOW (ref 3.5–5.0)
Albumin: 2.3 g/dL — ABNORMAL LOW (ref 3.5–5.0)
Alkaline Phosphatase: 47 U/L (ref 38–126)
Alkaline Phosphatase: 33 U/L — ABNORMAL LOW (ref 38–126)
Anion gap: 22 — ABNORMAL HIGH (ref 5–15)
Anion gap: 19 — ABNORMAL HIGH (ref 5–15)
BUN: 45 mg/dL — ABNORMAL HIGH (ref 6–20)
BUN: 54 mg/dL — ABNORMAL HIGH (ref 6–20)
CO2: 11 mmol/L — ABNORMAL LOW (ref 22–32)
CO2: 18 mmol/L — ABNORMAL LOW (ref 22–32)
Calcium: 7.4 mg/dL — ABNORMAL LOW (ref 8.9–10.3)
Calcium: 6 mg/dL — CL (ref 8.9–10.3)
Chloride: 109 mmol/L (ref 98–111)
Chloride: 102 mmol/L (ref 98–111)
Creatinine, Ser: 4.68 mg/dL — ABNORMAL HIGH (ref 0.44–1.00)
Creatinine, Ser: 4.7 mg/dL — ABNORMAL HIGH (ref 0.44–1.00)
GFR, Estimated: 11 mL/min — ABNORMAL LOW (ref >60)
GFR, Estimated: 11 mL/min — ABNORMAL LOW (ref >60)
Glucose, Bld: 132 mg/dL — ABNORMAL HIGH (ref 70–99)
Glucose, Bld: 189 mg/dL — ABNORMAL HIGH (ref 70–99)
Potassium: 4.2 mmol/L (ref 3.5–5.1)
Potassium: 4.7 mmol/L (ref 3.5–5.1)
Sodium: 142 mmol/L (ref 135–145)
Sodium: 139 mmol/L (ref 135–145)
Total Bilirubin: 1.5 mg/dL — ABNORMAL HIGH (ref 0.3–1.2)
Total Bilirubin: 1.5 mg/dL — ABNORMAL HIGH (ref 0.3–1.2)
Total Protein: 5.5 g/dL — ABNORMAL LOW (ref 6.5–8.1)
Total Protein: 5.5 g/dL — ABNORMAL LOW (ref 6.5–8.1)

## 2022-04-30 LAB — POCT I-STAT 7, (LYTES, BLD GAS, ICA,H+H)
Acid-base deficit: 14 mmol/L — ABNORMAL HIGH (ref 0.0–2.0)
Acid-base deficit: 11 mmol/L — ABNORMAL HIGH (ref 0.0–2.0)
Acid-base deficit: 6 mmol/L — ABNORMAL HIGH (ref 0.0–2.0)
Acid-base deficit: 10 mmol/L — ABNORMAL HIGH (ref 0.0–2.0)
Bicarbonate: 9 mmol/L — ABNORMAL LOW (ref 20.0–28.0)
Bicarbonate: 14.7 mmol/L — ABNORMAL LOW (ref 20.0–28.0)
Bicarbonate: 17.9 mmol/L — ABNORMAL LOW (ref 20.0–28.0)
Bicarbonate: 14.7 mmol/L — ABNORMAL LOW (ref 20.0–28.0)
Calcium, Ion: 0.93 mmol/L — ABNORMAL LOW (ref 1.15–1.40)
Calcium, Ion: 0.91 mmol/L — ABNORMAL LOW (ref 1.15–1.40)
Calcium, Ion: 0.82 mmol/L — CL (ref 1.15–1.40)
Calcium, Ion: 0.78 mmol/L — CL (ref 1.15–1.40)
HCT: 34 % — ABNORMAL LOW (ref 36.0–46.0)
HCT: 33 % — ABNORMAL LOW (ref 36.0–46.0)
HCT: 32 % — ABNORMAL LOW (ref 36.0–46.0)
HCT: 30 % — ABNORMAL LOW (ref 36.0–46.0)
Hemoglobin: 11.6 g/dL — ABNORMAL LOW (ref 12.0–15.0)
Hemoglobin: 11.2 g/dL — ABNORMAL LOW (ref 12.0–15.0)
Hemoglobin: 10.9 g/dL — ABNORMAL LOW (ref 12.0–15.0)
Hemoglobin: 10.2 g/dL — ABNORMAL LOW (ref 12.0–15.0)
O2 Saturation: 92 %
O2 Saturation: 96 %
O2 Saturation: 100 %
O2 Saturation: 99 %
Patient temperature: 97.6
Patient temperature: 97.6
Patient temperature: 99
Patient temperature: 105.8
Potassium: 4.2 mmol/L (ref 3.5–5.1)
Potassium: 4.1 mmol/L (ref 3.5–5.1)
Potassium: 4.3 mmol/L (ref 3.5–5.1)
Potassium: 3.6 mmol/L (ref 3.5–5.1)
Sodium: 141 mmol/L (ref 135–145)
Sodium: 142 mmol/L (ref 135–145)
Sodium: 136 mmol/L (ref 135–145)
Sodium: 138 mmol/L (ref 135–145)
TCO2: 9 mmol/L — ABNORMAL LOW (ref 22–32)
TCO2: 16 mmol/L — ABNORMAL LOW (ref 22–32)
TCO2: 19 mmol/L — ABNORMAL LOW (ref 22–32)
TCO2: 15 mmol/L — ABNORMAL LOW (ref 22–32)
pCO2 arterial: 15.1 mmHg — CL (ref 32–48)
pCO2 arterial: 30.2 mmHg — ABNORMAL LOW (ref 32–48)
pCO2 arterial: 30.9 mmHg — ABNORMAL LOW (ref 32–48)
pCO2 arterial: 32.2 mmHg (ref 32–48)
pH, Arterial: 7.38 (ref 7.35–7.45)
pH, Arterial: 7.293 — ABNORMAL LOW (ref 7.35–7.45)
pH, Arterial: 7.372 (ref 7.35–7.45)
pH, Arterial: 7.285 — ABNORMAL LOW (ref 7.35–7.45)
pO2, Arterial: 60 mmHg — ABNORMAL LOW (ref 83–108)
pO2, Arterial: 85 mmHg (ref 83–108)
pO2, Arterial: 197 mmHg — ABNORMAL HIGH (ref 83–108)
pO2, Arterial: 155 mmHg — ABNORMAL HIGH (ref 83–108)

## 2022-04-30 LAB — CBC WITH DIFFERENTIAL/PLATELET
Abs Immature Granulocytes: 0.41 10*3/uL — ABNORMAL HIGH (ref 0.00–0.07)
Basophils Absolute: 0.1 10*3/uL (ref 0.0–0.1)
Basophils Relative: 1 %
Eosinophils Absolute: 0 10*3/uL (ref 0.0–0.5)
Eosinophils Relative: 0 %
HCT: 35.6 % — ABNORMAL LOW (ref 36.0–46.0)
Hemoglobin: 11.3 g/dL — ABNORMAL LOW (ref 12.0–15.0)
Immature Granulocytes: 5 %
Lymphocytes Relative: 10 %
Lymphs Abs: 0.8 10*3/uL (ref 0.7–4.0)
MCH: 26.8 pg (ref 26.0–34.0)
MCHC: 31.7 g/dL (ref 30.0–36.0)
MCV: 84.6 fL (ref 80.0–100.0)
Monocytes Absolute: 0.6 10*3/uL (ref 0.1–1.0)
Monocytes Relative: 8 %
Neutro Abs: 5.8 10*3/uL (ref 1.7–7.7)
Neutrophils Relative %: 76 %
Platelets: 437 10*3/uL — ABNORMAL HIGH (ref 150–400)
RBC: 4.21 MIL/uL (ref 3.87–5.11)
RDW: 14.6 % (ref 11.5–15.5)
WBC: 7.7 10*3/uL (ref 4.0–10.5)
nRBC: 0.9 % — ABNORMAL HIGH (ref 0.0–0.2)

## 2022-04-30 LAB — URINALYSIS, ROUTINE W REFLEX MICROSCOPIC
Bilirubin Urine: NEGATIVE
Glucose, UA: NEGATIVE mg/dL
Ketones, ur: NEGATIVE mg/dL
Leukocytes,Ua: NEGATIVE
Nitrite: NEGATIVE
Protein, ur: 100 mg/dL — AB
Specific Gravity, Urine: 1.028 (ref 1.005–1.030)
pH: 5 (ref 5.0–8.0)

## 2022-04-30 LAB — LACTIC ACID, PLASMA
Lactic Acid, Venous: 7.1 mmol/L (ref 0.5–1.9)
Lactic Acid, Venous: 8.4 mmol/L (ref 0.5–1.9)
Lactic Acid, Venous: 5.5 mmol/L (ref 0.5–1.9)
Lactic Acid, Venous: 5.4 mmol/L (ref 0.5–1.9)
Lactic Acid, Venous: 5.3 mmol/L (ref 0.5–1.9)
Lactic Acid, Venous: 4.5 mmol/L (ref 0.5–1.9)
Lactic Acid, Venous: 4.2 mmol/L (ref 0.5–1.9)

## 2022-04-30 LAB — GLUCOSE, CAPILLARY
Glucose-Capillary: 160 mg/dL — ABNORMAL HIGH (ref 70–99)
Glucose-Capillary: 164 mg/dL — ABNORMAL HIGH (ref 70–99)
Glucose-Capillary: 154 mg/dL — ABNORMAL HIGH (ref 70–99)
Glucose-Capillary: 118 mg/dL — ABNORMAL HIGH (ref 70–99)
Glucose-Capillary: 131 mg/dL — ABNORMAL HIGH (ref 70–99)

## 2022-04-30 LAB — ECHOCARDIOGRAM COMPLETE
Height: 64 in
S' Lateral: 4.1 cm
Weight: 3856 oz

## 2022-04-30 LAB — PHOSPHORUS: Phosphorus: 3.2 mg/dL (ref 2.5–4.6)

## 2022-04-30 LAB — TROPONIN I (HIGH SENSITIVITY): Troponin I (High Sensitivity): 30 ng/L — ABNORMAL HIGH (ref <18)

## 2022-04-30 LAB — COOXEMETRY PANEL
Carboxyhemoglobin: 1.1 % (ref 0.5–1.5)
Methemoglobin: <0.7 % (ref 0.0–1.5)
O2 Saturation: 55.5 %
Total hemoglobin: 12 g/dL (ref 12.0–16.0)

## 2022-04-30 LAB — MAGNESIUM: Magnesium: 2.3 mg/dL (ref 1.7–2.4)

## 2022-04-30 LAB — PROCALCITONIN: Procalcitonin: >150 ng/mL

## 2022-04-30 MED ORDER — LACTATED RINGERS IV BOLUS
1000.0000 mL | Freq: Once | INTRAVENOUS | Status: AC
  Administered 2022-04-30: 1000 mL via INTRAVENOUS

## 2022-04-30 MED ORDER — FENTANYL 2500MCG IN NS 250ML (10MCG/ML) PREMIX INFUSION
50.0000 ug/h | INTRAVENOUS | Status: DC
  Administered 2022-04-30: 50 ug/h via INTRAVENOUS
  Administered 2022-04-30 – 2022-05-01 (×2): 150 ug/h via INTRAVENOUS
  Administered 2022-05-02: 200 ug/h via INTRAVENOUS
  Administered 2022-05-02: 150 ug/h via INTRAVENOUS
  Administered 2022-05-03: 200 ug/h via INTRAVENOUS
  Administered 2022-05-03: 75 ug/h via INTRAVENOUS
  Administered 2022-05-04: 50 ug/h via INTRAVENOUS
  Administered 2022-05-05 – 2022-05-06 (×2): 200 ug/h via INTRAVENOUS
  Administered 2022-05-06 – 2022-05-07 (×2): 100 ug/h via INTRAVENOUS
  Administered 2022-05-08: 150 ug/h via INTRAVENOUS
  Administered 2022-05-08: 100 ug/h via INTRAVENOUS
  Administered 2022-05-09 (×2): 200 ug/h via INTRAVENOUS
  Administered 2022-05-10: 75 ug/h via INTRAVENOUS
  Administered 2022-05-11: 200 ug/h via INTRAVENOUS
  Filled 2022-04-30 (×19): qty 250

## 2022-04-30 MED ORDER — ACETAMINOPHEN 10 MG/ML IV SOLN
1000.0000 mg | Freq: Four times a day (QID) | INTRAVENOUS | Status: AC
  Administered 2022-04-30 – 2022-05-01 (×4): 1000 mg via INTRAVENOUS
  Filled 2022-04-30 (×4): qty 100

## 2022-04-30 MED ORDER — MIDAZOLAM HCL 2 MG/2ML IJ SOLN
INTRAMUSCULAR | Status: AC
  Filled 2022-04-30: qty 2

## 2022-04-30 MED ORDER — ALBUMIN HUMAN 25 % IV SOLN
25.0000 g | Freq: Once | INTRAVENOUS | Status: AC
  Administered 2022-04-30: 25 g via INTRAVENOUS
  Filled 2022-04-30: qty 100

## 2022-04-30 MED ORDER — METHYLPREDNISOLONE SODIUM SUCC 125 MG IJ SOLR
125.0000 mg | Freq: Once | INTRAMUSCULAR | Status: AC
  Administered 2022-04-30: 125 mg via INTRAVENOUS
  Filled 2022-04-30: qty 2

## 2022-04-30 MED ORDER — DIPHENHYDRAMINE HCL 25 MG PO CAPS: 50.0000 mg | ORAL_CAPSULE | Freq: Once | ORAL | Status: AC

## 2022-04-30 MED ORDER — ETOMIDATE 2 MG/ML IV SOLN
INTRAVENOUS | Status: AC
  Filled 2022-04-30: qty 10

## 2022-04-30 MED ORDER — ETOMIDATE 2 MG/ML IV SOLN
10.0000 mg | Freq: Once | INTRAVENOUS | Status: AC
  Administered 2022-04-30: 10 mg via INTRAVENOUS

## 2022-04-30 MED ORDER — FENTANYL CITRATE PF 50 MCG/ML IJ SOSY
100.0000 ug | PREFILLED_SYRINGE | Freq: Once | INTRAMUSCULAR | Status: AC
  Administered 2022-04-30: 100 ug via INTRAVENOUS

## 2022-04-30 MED ORDER — LACTATED RINGERS IV BOLUS
500.0000 mL | Freq: Once | INTRAVENOUS | Status: AC
  Administered 2022-04-30: 500 mL via INTRAVENOUS

## 2022-04-30 MED ORDER — PIPERACILLIN-TAZOBACTAM IN DEX 2-0.25 GM/50ML IV SOLN
2.2500 g | Freq: Three times a day (TID) | INTRAVENOUS | Status: DC
  Administered 2022-04-30 – 2022-05-01 (×3): 2.25 g via INTRAVENOUS
  Filled 2022-04-30 (×5): qty 50

## 2022-04-30 MED ORDER — SODIUM CHLORIDE 0.9 % IV SOLN
3.0000 g | Freq: Once | INTRAVENOUS | Status: AC
  Administered 2022-04-30: 3 g via INTRAVENOUS
  Filled 2022-04-30: qty 30

## 2022-04-30 MED ORDER — MIDAZOLAM HCL 2 MG/2ML IJ SOLN: 2.0000 mg | Freq: Once | INTRAMUSCULAR | Status: AC

## 2022-04-30 MED ORDER — ORAL CARE MOUTH RINSE: 15.0000 mL | OROMUCOSAL | Status: DC | PRN

## 2022-04-30 MED ORDER — PROPOFOL 1000 MG/100ML IV EMUL
INTRAVENOUS | Status: AC
  Administered 2022-04-30: 5 ug/kg/min via INTRAVENOUS
  Filled 2022-04-30: qty 100

## 2022-04-30 MED ORDER — FENTANYL CITRATE PF 50 MCG/ML IJ SOSY
50.0000 ug | PREFILLED_SYRINGE | INTRAMUSCULAR | Status: DC | PRN
  Administered 2022-05-18: 50 ug via INTRAVENOUS
  Filled 2022-04-30 (×2): qty 1

## 2022-04-30 MED ORDER — PROPOFOL 1000 MG/100ML IV EMUL
0.0000 ug/kg/min | INTRAVENOUS | Status: DC
  Administered 2022-04-30 (×2): 40 ug/kg/min via INTRAVENOUS
  Administered 2022-04-30: 30 ug/kg/min via INTRAVENOUS
  Administered 2022-04-30: 40 ug/kg/min via INTRAVENOUS
  Filled 2022-04-30 (×4): qty 100

## 2022-04-30 MED ORDER — TRAVASOL 10 % IV SOLN
INTRAVENOUS | Status: AC
  Filled 2022-04-30: qty 529.2

## 2022-04-30 MED ORDER — MIDAZOLAM HCL 2 MG/2ML IJ SOLN
INTRAMUSCULAR | Status: AC
  Administered 2022-04-30: 2 mg via INTRAVENOUS
  Filled 2022-04-30: qty 2

## 2022-04-30 MED ORDER — ACETAMINOPHEN 10 MG/ML IV SOLN
1000.0000 mg | INTRAVENOUS | Status: AC | PRN
  Administered 2022-04-30: 1000 mg via INTRAVENOUS
  Filled 2022-04-30: qty 100

## 2022-04-30 MED ORDER — PANTOPRAZOLE SODIUM 40 MG IV SOLR
40.0000 mg | INTRAVENOUS | Status: DC
  Administered 2022-04-30 – 2022-05-12 (×13): 40 mg via INTRAVENOUS
  Filled 2022-04-30 (×14): qty 10

## 2022-04-30 MED ORDER — POLYETHYLENE GLYCOL 3350 17 G PO PACK
17.0000 g | PACK | Freq: Every day | ORAL | Status: DC
  Administered 2022-05-01: 17 g
  Filled 2022-04-30: qty 1

## 2022-04-30 MED ORDER — HYDROCORTISONE SOD SUC (PF) 100 MG IJ SOLR
100.0000 mg | Freq: Two times a day (BID) | INTRAMUSCULAR | Status: DC
  Administered 2022-04-30 – 2022-05-07 (×14): 100 mg via INTRAVENOUS
  Filled 2022-04-30 (×17): qty 2

## 2022-04-30 MED ORDER — FENTANYL BOLUS VIA INFUSION
50.0000 ug | INTRAVENOUS | Status: DC | PRN
  Administered 2022-05-01: 50 ug via INTRAVENOUS
  Administered 2022-05-02 (×4): 100 ug via INTRAVENOUS
  Administered 2022-05-02: 50 ug via INTRAVENOUS
  Administered 2022-05-02: 100 ug via INTRAVENOUS
  Administered 2022-05-02: 50 ug via INTRAVENOUS
  Administered 2022-05-03 (×2): 100 ug via INTRAVENOUS
  Administered 2022-05-03: 50 ug via INTRAVENOUS
  Administered 2022-05-06: 100 ug via INTRAVENOUS
  Administered 2022-05-06: 50 ug via INTRAVENOUS
  Administered 2022-05-06: 100 ug via INTRAVENOUS
  Administered 2022-05-07: 50 ug via INTRAVENOUS
  Administered 2022-05-07: 100 ug via INTRAVENOUS
  Administered 2022-05-07: 50 ug via INTRAVENOUS
  Administered 2022-05-07 – 2022-05-08 (×3): 100 ug via INTRAVENOUS
  Administered 2022-05-08 – 2022-05-09 (×2): 50 ug via INTRAVENOUS
  Administered 2022-05-09: 100 ug via INTRAVENOUS
  Administered 2022-05-09 (×2): 50 ug via INTRAVENOUS
  Administered 2022-05-10 (×4): 100 ug via INTRAVENOUS
  Administered 2022-05-11: 50 ug via INTRAVENOUS
  Administered 2022-05-11 (×3): 100 ug via INTRAVENOUS
  Administered 2022-05-11 – 2022-05-12 (×2): 75 ug via INTRAVENOUS
  Administered 2022-05-13: 100 ug via INTRAVENOUS
  Administered 2022-05-13: 50 ug via INTRAVENOUS

## 2022-04-30 MED ORDER — DIPHENHYDRAMINE HCL 50 MG/ML IJ SOLN
50.0000 mg | Freq: Once | INTRAMUSCULAR | Status: AC
  Administered 2022-04-30: 50 mg via INTRAVENOUS
  Filled 2022-04-30: qty 1

## 2022-04-30 MED ORDER — ROCURONIUM BROMIDE 50 MG/5ML IV SOLN
100.0000 mg | Freq: Once | INTRAVENOUS | Status: AC
  Administered 2022-04-30: 100 mg via INTRAVENOUS

## 2022-04-30 MED ORDER — MIDAZOLAM-SODIUM CHLORIDE 100-0.9 MG/100ML-% IV SOLN
0.5000 mg/h | INTRAVENOUS | Status: DC
  Administered 2022-04-30: 0.5 mg/h via INTRAVENOUS
  Filled 2022-04-30: qty 100

## 2022-04-30 MED ORDER — DOCUSATE SODIUM 50 MG/5ML PO LIQD
100.0000 mg | Freq: Two times a day (BID) | ORAL | Status: DC
  Administered 2022-05-01: 100 mg
  Filled 2022-04-30: qty 10

## 2022-04-30 MED ORDER — CALCIUM GLUCONATE-NACL 1-0.675 GM/50ML-% IV SOLN
1.0000 g | Freq: Once | INTRAVENOUS | Status: AC
  Administered 2022-04-30: 1000 mg via INTRAVENOUS
  Filled 2022-04-30: qty 50

## 2022-04-30 MED ORDER — CHLORHEXIDINE GLUCONATE 0.12 % MT SOLN
15.0000 mL | Freq: Once | OROMUCOSAL | Status: AC
  Administered 2022-04-30: 15 mL via OROMUCOSAL

## 2022-04-30 MED ORDER — MIDAZOLAM HCL 2 MG/2ML IJ SOLN
2.0000 mg | Freq: Once | INTRAMUSCULAR | Status: AC
  Administered 2022-04-30: 2 mg via INTRAVENOUS

## 2022-04-30 MED ORDER — HEPARIN SODIUM (PORCINE) 5000 UNIT/ML IJ SOLN
5000.0000 [IU] | Freq: Three times a day (TID) | INTRAMUSCULAR | Status: DC
  Administered 2022-04-30 – 2022-05-01 (×4): 5000 [IU] via SUBCUTANEOUS
  Filled 2022-04-30 (×4): qty 1

## 2022-04-30 MED ORDER — FENTANYL CITRATE PF 50 MCG/ML IJ SOSY
50.0000 ug | PREFILLED_SYRINGE | Freq: Once | INTRAMUSCULAR | Status: AC
  Administered 2022-04-30: 50 ug via INTRAVENOUS

## 2022-04-30 MED ORDER — ALBUMIN HUMAN 5 % IV SOLN
25.0000 g | Freq: Once | INTRAVENOUS | Status: AC
  Administered 2022-04-30: 25 g via INTRAVENOUS
  Filled 2022-04-30: qty 500

## 2022-04-30 MED ORDER — CALCIUM GLUCONATE-NACL 2-0.675 GM/100ML-% IV SOLN: 2.0000 g | Freq: Once | INTRAVENOUS | Status: DC

## 2022-04-30 MED ORDER — FENTANYL CITRATE PF 50 MCG/ML IJ SOSY
PREFILLED_SYRINGE | INTRAMUSCULAR | Status: AC
  Filled 2022-04-30: qty 2

## 2022-04-30 MED ORDER — ORAL CARE MOUTH RINSE
15.0000 mL | OROMUCOSAL | Status: DC
  Administered 2022-04-30 – 2022-05-19 (×205): 15 mL via OROMUCOSAL

## 2022-04-30 MED ORDER — FENTANYL CITRATE PF 50 MCG/ML IJ SOSY
50.0000 ug | PREFILLED_SYRINGE | INTRAMUSCULAR | Status: DC | PRN
  Administered 2022-04-30 – 2022-05-10 (×2): 100 ug via INTRAVENOUS
  Administered 2022-05-13 – 2022-05-16 (×6): 50 ug via INTRAVENOUS
  Administered 2022-05-16 – 2022-05-18 (×8): 100 ug via INTRAVENOUS
  Administered 2022-05-18: 50 ug via INTRAVENOUS
  Filled 2022-04-30 (×4): qty 2
  Filled 2022-04-30 (×2): qty 1
  Filled 2022-04-30: qty 2
  Filled 2022-04-30: qty 1
  Filled 2022-04-30 (×2): qty 2
  Filled 2022-04-30 (×2): qty 1
  Filled 2022-04-30 (×2): qty 2
  Filled 2022-04-30: qty 1
  Filled 2022-04-30: qty 2

## 2022-04-30 MED ORDER — SODIUM BICARBONATE 8.4 % IV SOLN
100.0000 meq | Freq: Once | INTRAVENOUS | Status: AC
  Administered 2022-04-30: 100 meq via INTRAVENOUS

## 2022-04-30 MED ORDER — PHENYLEPHRINE HCL-NACL 20-0.9 MG/250ML-% IV SOLN
0.0000 ug/min | INTRAVENOUS | Status: DC
  Administered 2022-04-30: 20 ug/min via INTRAVENOUS
  Administered 2022-05-01: 70 ug/min via INTRAVENOUS
  Filled 2022-04-30 (×2): qty 250

## 2022-04-30 NOTE — Progress Notes (Signed)
RT found patient with very labored breathing and O2 desat and return VTE ~ 261m less than VT given. RT assessed circuit and found no leak. RT assessed cuff and found no evidence of leak and ET tube found at 24cm and withdrawn back to 23cm. Discussed with RN any changes in medications. No reported changes. Breath sounds noted to be markedly decreased on the left side with absent sounds in the left base. RT asked RN to order STAT CXR.Resident Dr. KHumphrey Rollsnotified asked to come to bedside for evaluation.

## 2022-04-30 NOTE — Progress Notes (Signed)
PHARMACY NOTE:  ANTIMICROBIAL RENAL DOSAGE ADJUSTMENT  Current antimicrobial regimen includes a mismatch between antimicrobial dosage and estimated renal function.  As per policy approved by the Pharmacy & Therapeutics and Medical Executive Committees, the antimicrobial dosage will be adjusted accordingly.  Current antimicrobial dosage:  Zosyn 3.375 Q6H   Indication: Intra-abdominal infection   Renal Function:  Estimated Creatinine Clearance: 17 mL/min (A) (by C-G formula based on SCr of 4.68 mg/dL (H)). No KRT currently, potential for CRRT   Antimicrobial dosage has been changed to:  Zosyn 2.25g Q8H   Thank you for allowing pharmacy to be a part of this patient's care.  Ventura Sellers, [facility] 04/30/2022 1:25 PM

## 2022-04-30 NOTE — Plan of Care (Signed)
  Problem: Education: Goal: Knowledge of General Education information will improve Description: Including pain rating scale, medication(s)/side effects and non-pharmacologic comfort measures Outcome: Progressing   Problem: Health Behavior/Discharge Planning: Goal: Ability to manage health-related needs will improve Outcome: Progressing   Problem: Clinical Measurements: Goal: Ability to maintain clinical measurements within normal limits will improve Outcome: Progressing Goal: Will remain free from infection Outcome: Progressing Goal: Diagnostic test results will improve Outcome: Progressing Goal: Respiratory complications will improve Outcome: Progressing Goal: Cardiovascular complication will be avoided Outcome: Progressing   Problem: Activity: Goal: Risk for activity intolerance will decrease Outcome: Progressing   Problem: Nutrition: Goal: Adequate nutrition will be maintained Outcome: Progressing   Problem: Coping: Goal: Level of anxiety will decrease Outcome: Progressing   Problem: Elimination: Goal: Will not experience complications related to bowel motility Outcome: Progressing Goal: Will not experience complications related to urinary retention Outcome: Progressing   Problem: Pain Managment: Goal: General experience of comfort will improve Outcome: Progressing   Problem: Safety: Goal: Ability to remain free from injury will improve Outcome: Progressing   Problem: Skin Integrity: Goal: Risk for impaired skin integrity will decrease Outcome: Progressing   Problem: Fluid Volume: Goal: Hemodynamic stability will improve Outcome: Progressing   Problem: Clinical Measurements: Goal: Diagnostic test results will improve Outcome: Progressing Goal: Signs and symptoms of infection will decrease Outcome: Progressing   Problem: Respiratory: Goal: Ability to maintain adequate ventilation will improve Outcome: Progressing   Problem: Education: Goal: Ability  to describe self-care measures that may prevent or decrease complications (Diabetes Survival Skills Education) will improve Outcome: Progressing Goal: Individualized Educational Video(s) Outcome: Progressing   Problem: Coping: Goal: Ability to adjust to condition or change in health will improve Outcome: Progressing   Problem: Fluid Volume: Goal: Ability to maintain a balanced intake and output will improve Outcome: Progressing   Problem: Health Behavior/Discharge Planning: Goal: Ability to identify and utilize available resources and services will improve Outcome: Progressing Goal: Ability to manage health-related needs will improve Outcome: Progressing   Problem: Metabolic: Goal: Ability to maintain appropriate glucose levels will improve Outcome: Progressing   Problem: Nutritional: Goal: Maintenance of adequate nutrition will improve Outcome: Progressing Goal: Progress toward achieving an optimal weight will improve Outcome: Progressing   Problem: Skin Integrity: Goal: Risk for impaired skin integrity will decrease Outcome: Progressing   Problem: Tissue Perfusion: Goal: Adequacy of tissue perfusion will improve Outcome: Progressing   Problem: Activity: Goal: Ability to tolerate increased activity will improve Outcome: Progressing   Problem: Respiratory: Goal: Ability to maintain a clear airway and adequate ventilation will improve Outcome: Progressing   Problem: Role Relationship: Goal: Method of communication will improve Outcome: Progressing
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
## 2022-04-30 NOTE — Progress Notes (Signed)
eLink Physician-Brief Progress Note Patient Name: Patricia [name] DOB: [birth year] MRN: [id number]   Date of Service  04/30/2022  HPI/Events of Note  Patient is dyssynchronous on the ventilator despite 40 mcg of Propofol gtt, she is hypotensive despite 40 mcg of Levophed gtt and  0.04 mcg of  Vasopressin, probably exacerbated by high propofol requirement, patient received multiple crystalloid boluses earlier today and is + 7 liters positive for the day.  eICU Interventions  Will start Versed gtt and wean off Propofol secondary to hemodynamic impact, will check CVP and give Albumin if there is room for further volume  resuscitation (serum albumin 2.3 and she may be third spacing), Phenylephrine gtt ordered (QTC 372).        Kerry Kass Hanadi Stanly 04/30/2022, 8:02 PM

## 2022-04-30 NOTE — TOC Progression Note (Signed)
Transition of Care [facility]) - Progression Note    Patient Details  Name: Patricia [name] MRN: [id number] Date of Birth: 11-Feb-1971  Transition of [facility]) CM/SW [place], RN Phone Number:[phone]  04/30/2022, 4:20 PM  Clinical Narrative:    TOC continues to monitor. Patient not medically stable for disposition planning.         Expected Discharge Plan and Services                                                 Social Determinants of Health (SDOH) Interventions    Readmission Risk Interventions     No data to display

## 2022-04-30 NOTE — Progress Notes (Signed)
Critical ABG results given to Dr. Humphrey Rolls.

## 2022-04-30 NOTE — Progress Notes (Addendum)
eLink Physician-Brief Progress Note Patient Name: Patricia [name] DOB: 1971/07/27 MRN: [id number]   Date of Service  04/30/2022  HPI/Events of Note  Pt has since been intubated.   She is sedated on propofol gtt.  She is on levophed, vasopressin and neosynephrine.   eICU Interventions  Add fentanyl gtt.  Titrate propofol down if possible.      Intervention Category Intermediate Interventions: Hypotension - evaluation and management  Elsie Lincoln 04/30/2022, 6:03 AM  6:16 AM Lactic acid continues to rise to 8.4 <-- 7.1.  Plan> Increase HCO3 gtt to 150cc/hr.

## 2022-04-30 NOTE — Progress Notes (Signed)
Brief PCCM Progress Note  Paged for worsening metabolic/lactic acidosis and shock - pt was tachypneic with RR 50-60. Discussed recommendation to proceed with intubation to hopefully facilitate further workup of worsening shock including repeat CT A/P, CT Chest. Post-intubation PPV was >30, 500 of LR and 25g 5% albumin ordered. TTE already ordered. Discussed case with surgery, given AKI will order CT A/P with oral contrast only.   [place]

## 2022-04-30 NOTE — Progress Notes (Signed)
Nutrition Follow-up  DOCUMENTATION CODES:   Not applicable  INTERVENTION:   - TPN management per Pharmacy, recommend advancing to goal rate tomorrow 04/30/22 as pt has been without adequate nutrition since 04/25/22 and is at high risk for malnutrition  NUTRITION DIAGNOSIS:   Inadequate oral intake related to inability to eat as evidenced by NPO status (SBO).  Ongoing, being addressed via TPN  GOAL:   Patient will meet greater than or equal to 90% of their needs  Progressing  MONITOR:   Vent status, Labs, Weight trends, Skin, I & O's  REASON FOR ASSESSMENT:   Consult New TPN/TNA  ASSESSMENT:   Pt with hx of bipolar disorder, HTN, HLD, GERD, chronic bronchitis, GERD, DM type 2, and multiple prior abdominal surgeries presented to hospital for planned repair of a spigelian hernia.  09/26 - s/p robotic incisional hernia repair with mesh, extensive LOA 10/02 - transferred to ICU, pressors started, NG tube placed for decompression, TPN start 10/03 - intubated, NG tube exchanged for OG tube  Discussed pt with RN and during ICU rounds. Pt may require CRRT. RN reports exchanging NG tube for OG tube this morning. X-ray confirms gastric placement. OG tube to LIWS. Pt may need to go back to the OR due to concern for necrotic bowel.  Spoke with pt's family at bedside. Family reports pt had a good appetite and ate whatever she wanted and however much she wanted. Pt did not follow any specific diet. Family reports noticing that pt may have lost some weight over the last few months. They are unsure of her UBW or how much weight she may have lost. Reviewed weight history in chart. Pt appears to have experienced weight gain over the last 2-3 months.  Per Pharmacy note, TPN to continue to infuse at half rate of 45 ml/hr which will provide 1360 kcal and 53 grams of protein. With kcal from propofol at current rate, pt to receive 2052 kcal daily. Recommend advancing to goal rate tomorrow 04/30/22 as  pt has been without adequate nutrition since 04/25/22 (likely longer given pt only on regular diet for 1 day this admission) and is at high risk for malnutrition.  Patient is currently intubated on ventilator support MV: 14 L/min Temp (24hrs), Avg:98 F (36.7 C), Min:97.3 F (36.3 C), Max:99 F (37.2 C) BP (a-line): 98/61 MAP (a-line): 73  Drips: Propofol: 26.2 ml/hr (provides 692 kcal daily from lipid) Fentanyl Levophed Phenylephrine: off this AM Sodium bicarbonate: 150 ml/hr Vasopressin TPN: 45 ml/hr  Medications reviewed and include: colace, IV solu-cortef, SSI, IV protonix, miralax, IV abx  Labs reviewed: BUN 45, creatinine 4.68, ionized calcium 0.82, elevated LFTs, lactic acid 8.4 CBG's: 90-160 x 24 hours  NGT: 3650 ml x 24 hours I/O's: +9.6 L since admit  NUTRITION - FOCUSED PHYSICAL EXAM:  Flowsheet Row Most Recent Value  Orbital Region No depletion  Upper Arm Region No depletion  Thoracic and Lumbar Region No depletion  Buccal Region Unable to assess  Temple Region No depletion  Clavicle Bone Region Mild depletion  Clavicle and Acromion Bone Region Mild depletion  Scapular Bone Region No depletion  Dorsal Hand No depletion  Patellar Region No depletion  Anterior Thigh Region No depletion  Posterior Calf Region No depletion  Edema (RD Assessment) Mild  Hair Reviewed  Eyes Reviewed  Mouth Reviewed  Skin Reviewed  Nails Reviewed       Diet Order:   Diet Order  Diet NPO time specified  Diet effective now                   EDUCATION NEEDS:   No education needs have been identified at this time  Skin:  Skin Assessment: Reviewed RN Assessment (surgical incisions - abdomen)  Last BM:  04/28/22  Height:   Ht Readings from Last 1 Encounters:  04/30/22 '5\' 4"'$  (1.626 m)    Weight:   Wt Readings from Last 1 Encounters:  04/23/22 109.3 kg    Ideal Body Weight:  54.5 kg  BMI:  Body mass index is 41.37 kg/m.  Estimated  Nutritional Needs:   Kcal:  1800-2000 kcal/d  Protein:  95-110 g/d  Fluid:  1.8-2L/d    Gustavus Bryant, MS, RD, LDN Inpatient Clinical Dietitian Please see [facility] for contact information.

## 2022-04-30 NOTE — Progress Notes (Signed)
eLink Physician-Brief Progress Note Patient Name: Patricia [name] DOB: [birth year] MRN: [id number]   Date of Service  04/30/2022  HPI/Events of Note  ABG reviewed.  eICU Interventions  FiO2 reduced to 90 % , then wean as tolerated and obtain ABG on 70 %,  Calcium gluconate 3 gm iv x 1.        Kerry Kass Ebenezer Mccaskey 04/30/2022, 9:58 PM

## 2022-04-30 NOTE — Progress Notes (Signed)
Patient remains critically ill in ICU.  Pressor requirement decreased some today - levo on 20 right now from 40.  Lactic acid decreased to 5 from 8.  Received multiple boluses of fluid after ECHO earlier.  Appreciate critical care team support, appears fluid responsive.  With some signs of improvement and increasing lung opacities on imaging, I still wonder if aspiration is driving her illness and not an intra-abdominal source.  Appreciate critical care teams help, continue supportive care in ICU.  Surgery team will monitor closely.  Felicie Morn, MD General, Bariatric and Minimally Invasive Surgery [place]

## 2022-04-30 NOTE — Progress Notes (Addendum)
PHARMACY - TOTAL PARENTERAL NUTRITION CONSULT NOTE   Indication: Small bowel obstruction  Patient Measurements: Height: '5\' 4"'$  (162.6 cm) Weight: 109.3 kg (241 lb) IBW/kg (Calculated) : 54.7 TPN AdjBW (KG): 68.4 Body mass index is 41.37 kg/m.   Assessment: 51 yo female s/p difficult incisional hernia repair with long lysis of adhesion on 9/26. Who has developed a SBO based on CT scan 10/2.  Poor po intake since surgery  due to abdominal pain.  Pharmacy consulted to start TPN.  Patient intubated on 10/3 due to respiratory distress.   Glucose / Insulin: BG controlled < 180, 2 units of SSI over past 24 hours  Electrolytes: K: 4.1 (s/p 76mol Kphos, 286m IV Kcl), Ionized calcium: 0.91 (low), Phos: 3.2 (s/p 15 mmol Kphos), Mag: 2.3, Na: 142  Renal: Scr: 4.68 increased from 0.94  Hepatic: T bili: 1.5, AST/ALT: 84/48  Intake / Output; MIVF: No documented urine output, NG output -[id number] Imaging: 10/3 CT ab: continued SBO  10/2 CT Scan - SBO GI Surgeries / Procedures: Not since TPN initiated  Central access: CVC triple lumen  TPN start date: 10/2  Nutritional Goals: Goal TPN rate is 85 mL/hr (provides 100 g of protein and 1900 kcals per day)  RD Assessment:  Estimated Needs Total Energy Estimated Needs: 1800-2000 kcal/d Total Protein Estimated Needs: 95-110 g/d Total Fluid Estimated Needs: 1.8-2L/d  Current Nutrition:  NPO TPN   Plan:  Continue TPN at half rate 80m39m given clinical decline overnight at 1800, provides 1360kcal and  53 g of protein (meeting approximately 80% of current needs)  Propofol currently running at 40mc8m/min, patient receiving approximately 625kcal from lipid emulsion. Will remove lipid competent from TPN.  Given steep decline in renal function overnight, will be cautious with electrolytes within TPN by removing and supplementing outside of TPN as needed. High likelihood of CRRT initiation in the afternoon per CCM. Will remove chromium.   Electrolytes in TPN: Na 0mEq/49mK 0mEq/L18ma 3mEq/L,71m 0mEq/L, 41m Phos 0mmol/L. 49m acetate given refractory metabolic acidosis.  Will give 1g calcium gluconate outside of TPN.  Add standard MVI and trace elements to TPN Initiate Sensitive q4h SSI and adjust as needed  Consider concentrating TPN on 10/4 depending on fluid status as rate titrated up to goal.  Monitor TPN labs on Mon/Thurs, and daily until stabilized  Trystan Eads WiEsmeralda ArthurClinical Pharmacist Please see AMION for all Pharmacists' Contact Phone Numbers 04/30/2022, 8:21 AM

## 2022-04-30 NOTE — Consult Note (Signed)
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 dependent portions of the lower lobes of the lungs bilaterally, may reflect atelectasis and/or developing scarring. Nasogastric tube extending into the stomach. Hepatobiliary: Diffuse low attenuation throughout the hepatic parenchyma, indicative of a background of hepatic steatosis. No definite suspicious cystic or solid hepatic lesions are confidently identified on today's noncontrast CT examination. Unenhanced appearance of the gallbladder is normal. Pancreas: No definite pancreatic mass or peripancreatic fluid collections or inflammatory changes are noted on today's noncontrast CT examination. Spleen: Unremarkable. Adrenals/Urinary Tract: Unenhanced appearance of the kidneys, bilateral adrenal glands and urinary bladder is  normal. No hydroureteronephrosis. Stomach/Bowel: Nasogastric tube extending into the mid body of the stomach. Numerous dilated loops of small bowel measuring up to 4.6 cm in diameter, with multiple air-fluid levels. There is a suture line in the anterior aspect of the lower abdomen, presumably from prior partial small bowel resection. An  exact transition point is not confidently identified, however, the distal ileum appears completely decompressed, suggesting a transition point in the distal small bowel. Appendix is not confidently identified may be surgically absent. Small amount of gas and stool noted in the colon. Vascular/Lymphatic: Atherosclerotic calcifications in the abdominal aorta. No definite lymphadenopathy noted in the abdomen or pelvis. Reproductive: Status post total abdominal hysterectomy and bilateral salpingo-oophorectomy. Other: No significant volume of ascites.  No pneumoperitoneum. Musculoskeletal: Soft tissue stranding in the subcutaneous fat of the right anterior abdominal wall. There are no aggressive appearing lytic or blastic lesions noted in the visualized portions of the skeleton. IMPRESSION: 1. Findings are concerning for small bowel obstruction, likely with transition point in the distal small bowel, as above. Surgical consultation is recommended. 2. Interval development of dependent opacities in the lower lobes of the lungs bilaterally, which may reflect areas of atelectasis and/or developing scarring. 3. Hepatic steatosis. 4. Aortic atherosclerosis. 5. Additional incidental findings, as above. These results will be called to the ordering clinician or representative by the Radiologist Assistant, and communication documented in the PACS or [employer]. Electronically Signed   By: Vinnie Langton M.D.   On: 04/29/2022 08:01   DG Abd 1 View  Result Date: 04/29/2022 CLINICAL DATA:  Check gastric catheter placement EXAM: ABDOMEN - 1 VIEW COMPARISON:  None Available. FINDINGS: Gastric catheter is noted within the stomach. Persistent small bowel dilatation is noted. IMPRESSION: Gastric catheter within the stomach. Electronically Signed   By: Inez Catalina M.D.   On: 04/29/2022 03:09   DG Abd 1 View  Result Date: 04/29/2022 CLINICAL DATA:  NG tube placement. EXAM: ABDOMEN - 1 VIEW COMPARISON:  04/28/2022. FINDINGS:  There is a dilated loop of small bowel in the upper abdomen measuring up to 4.0 cm. An NG tube terminates in the stomach at the gastric fundus. No radio-opaque calculi or other significant radiographic abnormality are seen. Mild atelectasis is present at the left lung base. IMPRESSION: 1. Dilated loop of small bowel in the upper abdomen measuring up to 4 cm, possible ileus versus partial or early obstruction. 2. Enteric tube terminates in the stomach. Electronically Signed   By: Brett Fairy M.D.   On: 04/29/2022 01:55   DG Abd Portable 1V  Result Date: 04/28/2022 CLINICAL DATA:  [id number] in G-tube placement EXAM: PORTABLE ABDOMEN - 1 VIEW COMPARISON:  September 13th 2023 radiograph FINDINGS: Magdalene Molly is not visualized, limiting assessment. Enteric tube tip terminates over the expected region of the proximal stomach. There are dilated loops of small bowel within the upper abdomen. Bibasilar atelectasis. Incomplete visualization of the pelvis. IMPRESSION: 1. Enteric tube terminates over the expected region of the proximal stomach. 2. Diffuse gaseous dilation of multiple loops of small bowel. Differential considerations include small-bowel obstruction or ileus. Consider further evaluation with CT abdomen pelvis with contrast if concern for obstruction. Electronically Signed   By: Valentino Saxon M.D.   On: 04/28/2022 17:15    Review of Systems  Unable to perform ROS: Intubated   Blood pressure 99/62, pulse (!) 126, temperature (!) 101.9 F (38.8 C), temperature source Axillary, resp. rate (!) 30, height '5\' 4"'$  (1.626 m), weight 109.3 kg, last menstrual  period 07/07/2014, SpO2 96 %. Physical Exam Vitals and nursing note reviewed.  Constitutional:      Appearance: She is obese. She is ill-appearing and toxic-appearing.     Comments: Intubated  HENT:     Head: Atraumatic.     Right Ear: External ear normal.     Left Ear: External ear normal.     Mouth/Throat:     Comments: Intubated Eyes:      Conjunctiva/sclera: Conjunctivae normal.  Cardiovascular:     Rate and Rhythm: Regular rhythm. Tachycardia present.     Pulses: Normal pulses.     Heart sounds: Normal heart sounds.  Pulmonary:     Breath sounds: Normal breath sounds. No wheezing or rales.     Comments: On ventilator, anteriorly clear to auscultation Abdominal:     General: There is distension.     Palpations: Abdomen is soft.     Comments: No audible bowel sounds, recent scars from laparoscopic surgery noted  Musculoskeletal:     Right lower leg: Edema present.     Left lower leg: Edema present.     Comments: Trace ankle edema    Assessment/Plan: 1.  Acute kidney injury: This appears to be likely from ATN associated with septic shock/sepsis.  She is oliguric at this time and repeat labs are pending to decide on potential need for initiation of CRRT.  Arterial blood gas indicates improvement of her acidosis with pH now up to 7.37 from 7.29.  Plans noted by critical care service to place a trialysis catheter in anticipation of possibly needing renal replacement therapy especially if she needs surgical reexploration of her abdomen. Avoid nephrotoxic medications including NSAIDs and iodinated intravenous contrast exposure unless the latter is absolutely indicated.  Preferred narcotic agents for pain control are hydromorphone, fentanyl, and methadone. Morphine should not be used. Avoid Baclofen and avoid oral sodium phosphate and magnesium citrate based laxatives / bowel preps. Continue strict Input and Output monitoring. Will monitor the patient closely with you and intervene or adjust therapy as indicated by changes in clinical status/labs. 2.  Anion gap metabolic acidosis: This appears to be largely from lactic acidosis associated with septic shock as well as acute kidney injury.  She is status post intravenous fluid boluses and now on sodium bicarbonate drip. 3.  Septic shock: Likely related to aspiration pneumonia versus acute  abdominal event.  She has been started on broad-spectrum antimicrobial therapy with Zosyn and is on stress dose steroids along with sodium bicarbonate drip.  Surgical reexploration of abdomen likely to be undertaken this afternoon for source identification of persistent lactic acidosis. 4.  Acute hypoxic respiratory failure: Secondary to septic shock/aspiration pneumonia event.  Ventilator management per CCM. 5.  Status post robotic incisional hernia repair with mesh/lysis of adhesions: Complicated postoperatively with ileus and questions arising regarding bowel ischemia based on severe lactic acidosis.   Danyle Boening K. 04/30/2022, 1:32 PM

## 2022-04-30 NOTE — Progress Notes (Signed)
  Echocardiogram 2D Echocardiogram has been performed.  Patricia [name] 04/30/2022, 11:09 AM

## 2022-04-30 NOTE — Progress Notes (Signed)
Progress Note: General Surgery Service   Chief Complaint/Subjective: Intubated overnight, sedated, on multiple pressors in ICU.  Discussed care with ICU team on ICU rounds.  Objective: Vital signs in last 24 hours: Temp:  [97.3 F (36.3 C)-99 F (37.2 C)] 99 F (37.2 C) (10/03 0745) Pulse Rate:  [47-211] 124 (10/03 0900) Resp:  [26-55] 27 (10/03 0900) BP: (63-132)/(17-108) 70/17 (10/03 0900) SpO2:  [54 %-100 %] 100 % (10/03 0900) Arterial Line BP: (76-175)/(49-83) 89/59 (10/03 0900) FiO2 (%):  [100 %] 100 % (10/03 0801) Last BM Date : 04/28/02  Intake/Output from previous day: 10/02 0701 - 10/03 0700 In: 5207.3 [I.V.:1580; IV Piggyback:3627.2] Out: 3650 [Emesis/NG output:3650] Intake/Output this shift: Total I/O In: 2038.9 [I.V.:1781.9; IV Piggyback:257.1] Out: -    GEN: intubated, sedated GI: Abd difficult exam due to body habitus, large amount of NG output  Lab Results: CBC  Recent Labs    04/29/22 0031 04/29/22 1642 04/30/22 0037 04/30/22 0144 04/30/22 0323  WBC 5.2  --  7.7  --   --   HGB 12.2   < > 11.3* 11.6* 11.2*  HCT 39.2   < > 35.6* 34.0* 33.0*  PLT 422*  --  437*  --   --    < > = values in this interval not displayed.   BMET Recent Labs    04/29/22 0031 04/29/22 1642 04/30/22 0055 04/30/22 0144 04/30/22 0323  NA 138   < > 142 141 142  K 3.6   < > 4.2 4.2 4.1  CL 106  --  109  --   --   CO2 21*  --  11*  --   --   GLUCOSE 134*  --  132*  --   --   BUN 13  --  45*  --   --   CREATININE 0.94  --  4.68*  --   --   CALCIUM 8.9  --  7.4*  --   --    < > = values in this interval not displayed.   PT/INR No results for input(s): "LABPROT", "INR" in the last 72 hours. ABG Recent Labs    04/30/22 0144 04/30/22 0323  PHART 7.380 7.293*  HCO3 9.0* 14.7*    Anti-infectives: Anti-infectives (From admission, onward)    Start     Dose/Rate Route Frequency Ordered Stop   04/29/22 1530  piperacillin-tazobactam (ZOSYN) IVPB 3.375 g         3.375 g 12.5 mL/hr over 240 Minutes Intravenous Every 8 hours 04/29/22 1433     04/29/22 1145  metroNIDAZOLE (FLAGYL) IVPB 500 mg  Status:  Discontinued        500 mg 100 mL/hr over 60 Minutes Intravenous Every 12 hours 04/29/22 1047 04/29/22 1433   04/29/22 1145  vancomycin (VANCOCIN) IVPB 1000 mg/200 mL premix  Status:  Discontinued        1,000 mg 200 mL/hr over 60 Minutes Intravenous  Once 04/29/22 1047 04/29/22 1055   04/29/22 1145  vancomycin (VANCOREADY) IVPB 1500 mg/300 mL  Status:  Discontinued        1,500 mg 150 mL/hr over 120 Minutes Intravenous Every 24 hours 04/29/22 1055 04/29/22 1357   04/29/22 1045  ceFEPIme (MAXIPIME) 2 g in sodium chloride 0.9 % 100 mL IVPB  Status:  Discontinued        2 g 200 mL/hr over 30 Minutes Intravenous Every 8 hours 04/29/22 0954 04/29/22 1433   04/23/22 0730  ceFAZolin (ANCEF) IVPB 2g/100 mL  premix        2 g 200 mL/hr over 30 Minutes Intravenous On call to O.R. 04/23/22 0726 04/23/22 0828       Medications: Scheduled Meds:  Chlorhexidine Gluconate Cloth  6 each Topical Daily   docusate  100 mg Per Tube BID   enoxaparin (LOVENOX) injection  55 mg Subcutaneous Q24H   hydrocortisone sod succinate (SOLU-CORTEF) inj  100 mg Intravenous Q12H   insulin aspart  0-9 Units Subcutaneous TID WC   mouth rinse  15 mL Mouth Rinse Q2H   pantoprazole (PROTONIX) IV  40 mg Intravenous Q24H   polyethylene glycol  17 g Per Tube Daily   sodium chloride flush  10-40 mL Intracatheter Q12H   topiramate  25 mg Oral QHS   traZODone  100 mg Oral QHS   Continuous Infusions:  sodium chloride Stopped (04/29/22 1748)   fentaNYL infusion INTRAVENOUS 150 mcg/hr (04/30/22 0800)   methocarbamol (ROBAXIN) IV Stopped (04/29/22 1354)   norepinephrine (LEVOPHED) Adult infusion 20 mcg/min (04/30/22 0800)   phenylephrine (NEO-SYNEPHRINE) Adult infusion Stopped (04/30/22 0735)   piperacillin-tazobactam (ZOSYN)  IV 12.5 mL/hr at 04/30/22 0800   propofol (DIPRIVAN)  infusion 40 mcg/kg/min (04/30/22 0848)   sodium bicarbonate 150 mEq in sterile water 1,150 mL infusion 150 mL/hr at 04/30/22 0800   TPN ADULT (ION) 45 mL/hr at 04/30/22 0800   vasopressin 0.04 Units/min (04/30/22 0800)   PRN Meds:.alum & mag hydroxide-simeth, diphenhydrAMINE, fentaNYL, fentaNYL (SUBLIMAZE) injection, fentaNYL (SUBLIMAZE) injection, HYDROmorphone (DILAUDID) injection, hydrOXYzine, methocarbamol (ROBAXIN) IV, mouth rinse, prochlorperazine, simethicone, sodium chloride flush  Assessment/Plan: Ms. Rocchio underwent difficult robotic incisional hernia repair with mesh and long lysis of adhesions on 04/23/22  Admitted to the ICU postoperatively after aspiration event with septic shock.  I'm not sure if the source of her sepsis is the aspiration or an abdominal source.  CT limited by lack of IV contrast, but no free air, free fluid or pneumatosis.  Abdominal exam and symptoms were improving with NG decompression prior to intubation.  Continues to decompress.  She may need to return to the operating room for exploration if we can get her resuscitated to the point of tolerating an operation.  Currently, with multiple pressors, and unclear source of her sepsis, the best plan is probably to continue support in the ICU.     LOS: 6 days    Felicie Morn, MD  [facility] Use [email or web address] to contact on call provider  Daily Billing: [phone] - post op

## 2022-04-30 NOTE — Procedures (Signed)
Intubation Procedure Note  Patricia [name]  [id number]  [birth year]  Date:04/30/22  Time:2:36 AM   Provider Performing:Reyann Troop M Verlee Monte    Procedure: Intubation (73668)  Indication(s) Respiratory Failure  Consent Consent is verbal with patient and sister [facility]   Anesthesia Etomidate, Versed, and Rocuronium   Time Out Verified patient identification, verified procedure, site/side was marked, verified correct patient position, special equipment/implants available, medications/allergies/relevant history reviewed, required imaging and test results available.   Sterile Technique Usual hand hygeine, masks, and gloves were used   Procedure Description Patient positioned in bed supine.  Sedation given as noted above.  Patient was intubated with endotracheal tube using  glidescope s3 .  View was grade 2a. Number of attempts was 1.  Colorimetric CO2 detector was consistent with tracheal placement.   Complications/Tolerance None; patient tolerated the procedure well. Chest X-ray is ordered to verify placement.   EBL Minimal   Specimen(s) None

## 2022-04-30 NOTE — Procedures (Signed)
Central Venous Catheter Insertion Procedure Note  Patricia [name]  [id number]  29-May-1971  Date:04/30/22  Time:3:44 AM   Provider Performing:Zaidee Rion Jerilynn Mages Ayesha Rumpf   Procedure: Insertion of Non-tunneled Central Venous [phone]) with US guidance ([id number])   Indication(s): Medication administration and Difficult access  Consent: Risks of the procedure as well as the alternatives and risks of each were explained to the patient and/or caregiver.  Consent for the procedure was obtained and is signed in the bedside chart  Anesthesia: Topical only with 1% lidocaine   Timeout: Verified patient identification, verified procedure, site/side was marked, verified correct patient position, special equipment/implants available, medications/allergies/relevant history reviewed, required imaging and test results available.  Sterile Technique: Maximal sterile technique including full sterile barrier drape, hand hygiene, sterile gown, sterile gloves, mask, hair covering, sterile ultrasound probe cover (if used).     Procedure Description: Area of catheter insertion was cleaned with chlorhexidine and draped in sterile fashion.  With real-time ultrasound guidance a central venous catheter was placed into the left internal jugular vein. Nonpulsatile blood flow and easy flushing noted in all ports.  The catheter was sutured in place and sterile dressing applied.  Complications/Tolerance: None; patient tolerated the procedure well. Chest X-ray is ordered to verify placement for internal jugular or subclavian cannulation.   Chest x-ray is not ordered for femoral cannulation.  EBL Minimal  Specimen(s) None  Lestine Mount, PA-C [facility] 04/30/22 3:45 AM  Please see [email or web address] for pager details.  From 7A-7P if no response, please call [phone] After hours, please call ELink [phone]

## 2022-05-01 ENCOUNTER — Inpatient Hospital Stay (HOSPITAL_COMMUNITY)
Admission: RE | Disposition: A | Discharge status: Home / Self Care | Payer: Self-pay | Source: Home / Self Care | Attending: Surgery

## 2022-05-01 ENCOUNTER — Inpatient Hospital Stay (HOSPITAL_COMMUNITY): Payer: Medicaid Other

## 2022-05-01 DIAGNOSIS — I2102 ST elevation (STEMI) myocardial infarction involving left anterior descending coronary artery: Secondary | ICD-10-CM | POA: Diagnosis not present

## 2022-05-01 DIAGNOSIS — J690 Pneumonitis due to inhalation of food and vomit: Secondary | ICD-10-CM | POA: Diagnosis not present

## 2022-05-01 DIAGNOSIS — R570 Cardiogenic shock: Secondary | ICD-10-CM | POA: Diagnosis not present

## 2022-05-01 DIAGNOSIS — R6521 Severe sepsis with septic shock: Secondary | ICD-10-CM | POA: Diagnosis not present

## 2022-05-01 DIAGNOSIS — A419 Sepsis, unspecified organism: Secondary | ICD-10-CM | POA: Diagnosis not present

## 2022-05-01 DIAGNOSIS — I5181 Takotsubo syndrome: Secondary | ICD-10-CM

## 2022-05-01 HISTORY — PX: LEFT HEART CATH AND CORONARY ANGIOGRAPHY: CATH118249

## 2022-05-01 HISTORY — PX: RIGHT/LEFT HEART CATH AND CORONARY ANGIOGRAPHY: CATH118266

## 2022-05-01 HISTORY — PX: CORONARY/GRAFT ACUTE MI REVASCULARIZATION: CATH118305

## 2022-05-01 LAB — URINE CULTURE: Culture: NO GROWTH

## 2022-05-01 LAB — POCT I-STAT EG7
Acid-base deficit: 10 mmol/L — ABNORMAL HIGH (ref 0.0–2.0)
Bicarbonate: 17.4 mmol/L — ABNORMAL LOW (ref 20.0–28.0)
Calcium, Ion: 0.61 mmol/L — CL (ref 1.15–1.40)
HCT: 29 % — ABNORMAL LOW (ref 36.0–46.0)
Hemoglobin: 9.9 g/dL — ABNORMAL LOW (ref 12.0–15.0)
O2 Saturation: 55 %
Potassium: 2.9 mmol/L — ABNORMAL LOW (ref 3.5–5.1)
Sodium: 140 mmol/L (ref 135–145)
TCO2: 19 mmol/L — ABNORMAL LOW (ref 22–32)
pCO2, Ven: 42.8 mmHg — ABNORMAL LOW (ref 44–60)
pH, Ven: 7.218 — ABNORMAL LOW (ref 7.25–7.43)
pO2, Ven: 34 mmHg (ref 32–45)

## 2022-05-01 LAB — MAGNESIUM: Magnesium: 2 mg/dL (ref 1.7–2.4)

## 2022-05-01 LAB — POCT I-STAT 7, (LYTES, BLD GAS, ICA,H+H)
Acid-base deficit: 8 mmol/L — ABNORMAL HIGH (ref 0.0–2.0)
Bicarbonate: 18.7 mmol/L — ABNORMAL LOW (ref 20.0–28.0)
Calcium, Ion: 0.82 mmol/L — CL (ref 1.15–1.40)
HCT: 34 % — ABNORMAL LOW (ref 36.0–46.0)
Hemoglobin: 11.6 g/dL — ABNORMAL LOW (ref 12.0–15.0)
O2 Saturation: 100 %
Potassium: 3.7 mmol/L (ref 3.5–5.1)
Sodium: 132 mmol/L — ABNORMAL LOW (ref 135–145)
TCO2: 20 mmol/L — ABNORMAL LOW (ref 22–32)
pCO2 arterial: 43.1 mmHg (ref 32–48)
pH, Arterial: 7.246 — ABNORMAL LOW (ref 7.35–7.45)
pO2, Arterial: 341 mmHg — ABNORMAL HIGH (ref 83–108)

## 2022-05-01 LAB — CBC
HCT: 34.4 % — ABNORMAL LOW (ref 36.0–46.0)
Hemoglobin: 11.4 g/dL — ABNORMAL LOW (ref 12.0–15.0)
MCH: 27.3 pg (ref 26.0–34.0)
MCHC: 33.1 g/dL (ref 30.0–36.0)
MCV: 82.3 fL (ref 80.0–100.0)
Platelets: 129 10*3/uL — ABNORMAL LOW (ref 150–400)
RBC: 4.18 MIL/uL (ref 3.87–5.11)
RDW: 14.7 % (ref 11.5–15.5)
WBC: 10 10*3/uL (ref 4.0–10.5)
nRBC: 19.4 % — ABNORMAL HIGH (ref 0.0–0.2)

## 2022-05-01 LAB — BASIC METABOLIC PANEL
Anion gap: 21 — ABNORMAL HIGH (ref 5–15)
BUN: 71 mg/dL — ABNORMAL HIGH (ref 6–20)
CO2: 15 mmol/L — ABNORMAL LOW (ref 22–32)
Calcium: 6.6 mg/dL — ABNORMAL LOW (ref 8.9–10.3)
Chloride: 97 mmol/L — ABNORMAL LOW (ref 98–111)
Creatinine, Ser: 6.01 mg/dL — ABNORMAL HIGH (ref 0.44–1.00)
GFR, Estimated: 8 mL/min — ABNORMAL LOW (ref >60)
Glucose, Bld: 190 mg/dL — ABNORMAL HIGH (ref 70–99)
Potassium: 3.4 mmol/L — ABNORMAL LOW (ref 3.5–5.1)
Sodium: 133 mmol/L — ABNORMAL LOW (ref 135–145)

## 2022-05-01 LAB — GLUCOSE, CAPILLARY
Glucose-Capillary: 128 mg/dL — ABNORMAL HIGH (ref 70–99)
Glucose-Capillary: 151 mg/dL — ABNORMAL HIGH (ref 70–99)
Glucose-Capillary: 164 mg/dL — ABNORMAL HIGH (ref 70–99)
Glucose-Capillary: 110 mg/dL — ABNORMAL HIGH (ref 70–99)
Glucose-Capillary: 215 mg/dL — ABNORMAL HIGH (ref 70–99)
Glucose-Capillary: 133 mg/dL — ABNORMAL HIGH (ref 70–99)

## 2022-05-01 LAB — COMPREHENSIVE METABOLIC PANEL
ALT: 632 U/L — ABNORMAL HIGH (ref 0–44)
AST: 1115 U/L — ABNORMAL HIGH (ref 15–41)
Albumin: 2.3 g/dL — ABNORMAL LOW (ref 3.5–5.0)
Alkaline Phosphatase: 62 U/L (ref 38–126)
Anion gap: 23 — ABNORMAL HIGH (ref 5–15)
BUN: 69 mg/dL — ABNORMAL HIGH (ref 6–20)
CO2: 16 mmol/L — ABNORMAL LOW (ref 22–32)
Calcium: 6.6 mg/dL — ABNORMAL LOW (ref 8.9–10.3)
Chloride: 97 mmol/L — ABNORMAL LOW (ref 98–111)
Creatinine, Ser: 6.13 mg/dL — ABNORMAL HIGH (ref 0.44–1.00)
GFR, Estimated: 8 mL/min — ABNORMAL LOW (ref >60)
Glucose, Bld: 191 mg/dL — ABNORMAL HIGH (ref 70–99)
Potassium: 3.5 mmol/L (ref 3.5–5.1)
Sodium: 136 mmol/L (ref 135–145)
Total Bilirubin: 1.8 mg/dL — ABNORMAL HIGH (ref 0.3–1.2)
Total Protein: 5 g/dL — ABNORMAL LOW (ref 6.5–8.1)

## 2022-05-01 LAB — COOXEMETRY PANEL
Carboxyhemoglobin: 1.2 % (ref 0.5–1.5)
Methemoglobin: <0.7 % (ref 0.0–1.5)
O2 Saturation: 53.7 %
Total hemoglobin: 9.6 g/dL — ABNORMAL LOW (ref 12.0–16.0)

## 2022-05-01 LAB — TROPONIN I (HIGH SENSITIVITY): Troponin I (High Sensitivity): >24000 ng/L (ref <18)

## 2022-05-01 LAB — RENAL FUNCTION PANEL
Albumin: 1.9 g/dL — ABNORMAL LOW (ref 3.5–5.0)
Anion gap: 19 — ABNORMAL HIGH (ref 5–15)
BUN: 60 mg/dL — ABNORMAL HIGH (ref 6–20)
CO2: 16 mmol/L — ABNORMAL LOW (ref 22–32)
Calcium: 6.4 mg/dL — CL (ref 8.9–10.3)
Chloride: 99 mmol/L (ref 98–111)
Creatinine, Ser: 4.96 mg/dL — ABNORMAL HIGH (ref 0.44–1.00)
GFR, Estimated: 10 mL/min — ABNORMAL LOW (ref >60)
Glucose, Bld: 192 mg/dL — ABNORMAL HIGH (ref 70–99)
Phosphorus: 5.5 mg/dL — ABNORMAL HIGH (ref 2.5–4.6)
Potassium: 3.5 mmol/L (ref 3.5–5.1)
Sodium: 134 mmol/L — ABNORMAL LOW (ref 135–145)

## 2022-05-01 LAB — LACTIC ACID, PLASMA
Lactic Acid, Venous: 4.3 mmol/L (ref 0.5–1.9)
Lactic Acid, Venous: 4.7 mmol/L (ref 0.5–1.9)

## 2022-05-01 LAB — PROCALCITONIN: Procalcitonin: >150 ng/mL

## 2022-05-01 LAB — PHOSPHORUS: Phosphorus: 6.3 mg/dL — ABNORMAL HIGH (ref 2.5–4.6)

## 2022-05-01 SURGERY — CORONARY/GRAFT ACUTE MI REVASCULARIZATION
Anesthesia: LOCAL

## 2022-05-01 MED ORDER — ONDANSETRON HCL 4 MG/2ML IJ SOLN
4.0000 mg | Freq: Four times a day (QID) | INTRAMUSCULAR | Status: DC | PRN
  Administered 2022-05-18 – 2022-06-04 (×15): 4 mg via INTRAVENOUS
  Filled 2022-05-01 (×17): qty 2

## 2022-05-01 MED ORDER — HEPARIN (PORCINE) IN NACL 1000-0.9 UT/500ML-% IV SOLN
INTRAVENOUS | Status: DC | PRN
  Administered 2022-05-01 (×2): 500 mL

## 2022-05-01 MED ORDER — LABETALOL HCL 5 MG/ML IV SOLN: 10.0000 mg | INTRAVENOUS | Status: AC | PRN

## 2022-05-01 MED ORDER — SODIUM CHLORIDE 0.9% IV SOLUTION: INTRAVENOUS | Status: DC

## 2022-05-01 MED ORDER — ACETAMINOPHEN 10 MG/ML IV SOLN: 1000.0000 mg | Freq: Four times a day (QID) | INTRAVENOUS | Status: AC | PRN

## 2022-05-01 MED ORDER — ACETAMINOPHEN 325 MG PO TABS
650.0000 mg | ORAL_TABLET | ORAL | Status: DC | PRN
  Administered 2022-05-10 – 2022-05-15 (×6): 650 mg
  Filled 2022-05-01 (×6): qty 2

## 2022-05-01 MED ORDER — SODIUM CHLORIDE 0.9 % IV SOLN
250.0000 mL | INTRAVENOUS | Status: DC | PRN
  Administered 2022-05-11: 250 mL via INTRAVENOUS

## 2022-05-01 MED ORDER — DOBUTAMINE INFUSION FOR EP/ECHO/NUC (1000 MCG/ML)
INTRAVENOUS | Status: AC
  Filled 2022-05-01: qty 250

## 2022-05-01 MED ORDER — NITROGLYCERIN 1 MG/10 ML FOR IR/CATH LAB
INTRA_ARTERIAL | Status: AC
  Filled 2022-05-01: qty 10

## 2022-05-01 MED ORDER — HEPARIN (PORCINE) 25000 UT/250ML-% IV SOLN
1100.0000 [IU]/h | INTRAVENOUS | Status: DC
  Administered 2022-05-01: 1100 [IU]/h via INTRAVENOUS
  Filled 2022-05-01: qty 250

## 2022-05-01 MED ORDER — PIPERACILLIN-TAZOBACTAM 3.375 G IVPB: 3.3750 g | Freq: Four times a day (QID) | INTRAVENOUS | Status: DC

## 2022-05-01 MED ORDER — DOCUSATE SODIUM 50 MG/5ML PO LIQD
100.0000 mg | Freq: Two times a day (BID) | ORAL | Status: DC
  Administered 2022-05-02: 100 mg
  Filled 2022-05-01 (×2): qty 10

## 2022-05-01 MED ORDER — INSULIN ASPART 100 UNIT/ML IJ SOLN
0.0000 [IU] | INTRAMUSCULAR | Status: DC
  Administered 2022-05-01: 5 [IU] via SUBCUTANEOUS
  Administered 2022-05-01: 2 [IU] via SUBCUTANEOUS
  Administered 2022-05-01: 3 [IU] via SUBCUTANEOUS
  Administered 2022-05-02 (×2): 5 [IU] via SUBCUTANEOUS

## 2022-05-01 MED ORDER — HEPARIN (PORCINE) IN NACL 1000-0.9 UT/500ML-% IV SOLN
INTRAVENOUS | Status: AC
  Filled 2022-05-01: qty 1000

## 2022-05-01 MED ORDER — DIPHENHYDRAMINE HCL 25 MG PO CAPS: 50.0000 mg | ORAL_CAPSULE | Freq: Once | ORAL | Status: AC

## 2022-05-01 MED ORDER — SODIUM CHLORIDE 0.9% IV SOLUTION: INTRAVENOUS | Status: DC | PRN

## 2022-05-01 MED ORDER — MIDAZOLAM-SODIUM CHLORIDE 100-0.9 MG/100ML-% IV SOLN
INTRAVENOUS | Status: AC
  Filled 2022-05-01: qty 100

## 2022-05-01 MED ORDER — METHYLPREDNISOLONE SODIUM SUCC 40 MG IJ SOLR
40.0000 mg | Freq: Once | INTRAMUSCULAR | Status: AC
  Administered 2022-05-01: 40 mg via INTRAVENOUS
  Filled 2022-05-01: qty 1

## 2022-05-01 MED ORDER — HEPARIN SODIUM (PORCINE) 5000 UNIT/ML IJ SOLN
5000.0000 [IU] | Freq: Three times a day (TID) | INTRAMUSCULAR | Status: DC
  Administered 2022-05-01 – 2022-05-03 (×5): 5000 [IU] via SUBCUTANEOUS
  Filled 2022-05-01 (×5): qty 1

## 2022-05-01 MED ORDER — LIDOCAINE HCL (PF) 1 % IJ SOLN
INTRAMUSCULAR | Status: DC | PRN
  Administered 2022-05-01: 25 mL via INTRADERMAL

## 2022-05-01 MED ORDER — DIPHENHYDRAMINE HCL 50 MG/ML IJ SOLN
50.0000 mg | Freq: Once | INTRAMUSCULAR | Status: AC
  Administered 2022-05-01: 50 mg via INTRAVENOUS
  Filled 2022-05-01: qty 1

## 2022-05-01 MED ORDER — DOBUTAMINE IN D5W 4-5 MG/ML-% IV SOLN
2.5000 ug/kg/min | INTRAVENOUS | Status: DC
  Administered 2022-05-01 – 2022-05-07 (×8): 5 ug/kg/min via INTRAVENOUS
  Administered 2022-05-08: 2.5 ug/kg/min via INTRAVENOUS
  Filled 2022-05-01 (×7): qty 250

## 2022-05-01 MED ORDER — HEPARIN SODIUM (PORCINE) 1000 UNIT/ML DIALYSIS
1000.0000 [IU] | INTRAMUSCULAR | Status: DC | PRN
  Administered 2022-05-01: 4000 [IU] via INTRAVENOUS_CENTRAL
  Filled 2022-05-01: qty 6
  Filled 2022-05-01: qty 4

## 2022-05-01 MED ORDER — PRISMASOL BGK 4/2.5 32-4-2.5 MEQ/L EC SOLN
Status: DC
  Filled 2022-05-01 (×10): qty 5000

## 2022-05-01 MED ORDER — MIDAZOLAM BOLUS VIA INFUSION: 0.0000 mg | INTRAVENOUS | Status: DC | PRN

## 2022-05-01 MED ORDER — PRISMASOL BGK 4/2.5 32-4-2.5 MEQ/L REPLACEMENT SOLN
Status: DC
  Filled 2022-05-01 (×3): qty 5000

## 2022-05-01 MED ORDER — INSULIN ASPART 100 UNIT/ML IJ SOLN
0.0000 [IU] | INTRAMUSCULAR | Status: DC
  Administered 2022-05-01: 2 [IU] via SUBCUTANEOUS

## 2022-05-01 MED ORDER — POLYETHYLENE GLYCOL 3350 17 G PO PACK
17.0000 g | PACK | Freq: Every day | ORAL | Status: DC
  Administered 2022-05-02: 17 g
  Filled 2022-05-01: qty 1

## 2022-05-01 MED ORDER — SODIUM CHLORIDE 0.9% FLUSH
3.0000 mL | Freq: Two times a day (BID) | INTRAVENOUS | Status: DC
  Administered 2022-05-01 – 2022-05-14 (×24): 3 mL via INTRAVENOUS

## 2022-05-01 MED ORDER — IOHEXOL 350 MG/ML SOLN
INTRAVENOUS | Status: DC | PRN
  Administered 2022-05-01: 40 mL

## 2022-05-01 MED ORDER — MIDAZOLAM-SODIUM CHLORIDE 100-0.9 MG/100ML-% IV SOLN: 0.0000 mg/h | INTRAVENOUS | Status: DC

## 2022-05-01 MED ORDER — DOBUTAMINE INFUSION FOR EP/ECHO/NUC (1000 MCG/ML)
INTRAVENOUS | Status: AC | PRN
  Administered 2022-05-01: 5 ug/kg/min via INTRAVENOUS

## 2022-05-01 MED ORDER — PIPERACILLIN-TAZOBACTAM 3.375 G IVPB 30 MIN
3.3750 g | Freq: Four times a day (QID) | INTRAVENOUS | Status: DC
  Administered 2022-05-01 – 2022-05-06 (×19): 3.375 g via INTRAVENOUS
  Filled 2022-05-01 (×39): qty 50

## 2022-05-01 MED ORDER — SODIUM CHLORIDE 0.9 % FOR CRRT: INTRAVENOUS_CENTRAL | Status: DC | PRN

## 2022-05-01 MED ORDER — SODIUM CHLORIDE 0.9 % IV SOLN: INTRAVENOUS | Status: DC

## 2022-05-01 MED ORDER — CALCIUM GLUCONATE-NACL 2-0.675 GM/100ML-% IV SOLN
2.0000 g | Freq: Once | INTRAVENOUS | Status: AC
  Administered 2022-05-02: 2000 mg via INTRAVENOUS
  Filled 2022-05-01: qty 100

## 2022-05-01 MED ORDER — SODIUM CHLORIDE 0.9% FLUSH: 3.0000 mL | INTRAVENOUS | Status: DC | PRN

## 2022-05-01 MED ORDER — HYDRALAZINE HCL 20 MG/ML IJ SOLN: 10.0000 mg | INTRAMUSCULAR | Status: AC | PRN

## 2022-05-01 MED ORDER — LIDOCAINE HCL (PF) 1 % IJ SOLN
INTRAMUSCULAR | Status: AC
  Filled 2022-05-01: qty 30

## 2022-05-01 MED ORDER — TRACE MINERALS CU-MN-SE-ZN 300-55-60-3000 MCG/ML IV SOLN
INTRAVENOUS | Status: AC
  Filled 2022-05-01: qty 800.8

## 2022-05-01 SURGICAL SUPPLY — 15 items
CATH INFINITI 5FR MULTPACK ANG (CATHETERS) IMPLANT
CATH SWAN GANZ VIP 7.5F (CATHETERS) IMPLANT
DEVICE WIRE ANGIOSEAL 6FR (Vascular Products) IMPLANT
KIT ENCORE 26 ADVANTAGE (KITS) IMPLANT
KIT HEART LEFT (KITS) ×1 IMPLANT
PACK CARDIAC CATHETERIZATION (CUSTOM PROCEDURE TRAY) ×1 IMPLANT
SHEATH PINNACLE 5F 10CM (SHEATH) IMPLANT
SHEATH PINNACLE 8F 10CM (SHEATH) IMPLANT
SHEATH PROBE COVER 6X72 (BAG) IMPLANT
SLEEVE REPOSITIONING LENGTH 30 (MISCELLANEOUS) IMPLANT
TRANSDUCER W/STOPCOCK (MISCELLANEOUS) ×1 IMPLANT
TUBING CIL FLEX 10 FLL-RA (TUBING) ×1 IMPLANT
WIRE EMERALD 3MM-J .025X260CM (WIRE) IMPLANT
WIRE EMERALD 3MM-J .035X150CM (WIRE) IMPLANT
WIRE MICRO SET SILHO 5FR 7 (SHEATH) IMPLANT

## 2022-05-01 NOTE — Progress Notes (Addendum)
   05/01/22 2015  Clinical Encounter Type  Visited With Health care provider  Visit Type Initial;Code  Referral From Nurse  Consult/Referral To Chaplain   Chaplain responded to a STEMI. Patient was under the care of the medical team.  The patient's nurse informed me that the patient's sister was on the way and that no other family was present. If a chaplain is requested someone will respond.   Danice Goltz [facility]  [phone]

## 2022-05-01 NOTE — Progress Notes (Signed)
Patient ID: Patricia [name], female   DOB: Sep 17, 1970, 51 y.o.   MRN: [id number] [facility] Progress Note   Assessment/ Plan:   1.  Acute kidney injury: This appears to be likely from ATN associated with septic shock/sepsis.  Increased hemodynamic instability overnight in the setting of ventilator dyssynchrony/propofol requirement requiring additional pressors and diminishing urine output noted with worsening metabolic acidosis on arterial blood gas last night.  We will begin CRRT at this time with a primary goal for improving acid/base balance and offering clearance of azotemia.  We will then begin ultrafiltration as hemodynamic status improves/permits.  Discussed dialysis catheter placement with CCM resident. 2.  Anion gap metabolic acidosis: This appears to be largely from lactic acidosis associated with septic shock as well as acute kidney injury.  She is status post intravenous fluid boluses and currently on sodium bicarbonate drip-begin hemodialysis 3.  Septic shock: Likely related to aspiration pneumonia versus acute abdominal event.  She has been started on broad-spectrum antimicrobial therapy with Zosyn and is on stress dose steroids along with sodium bicarbonate drip.  Abdominal source felt less likely by surgery to prompt reexploration. 4.  Acute hypoxic respiratory failure: Secondary to septic shock/aspiration pneumonia event.  Ventilator management per CCM. 5.  Status post robotic incisional hernia repair with mesh/lysis of adhesions: Additional management per surgical service.  Subjective:   Ventilator dyssynchrony overnight with high propofol requirements resulting in hemodynamic instability and need for additional pressor (currently weaned off).   Objective:   BP 95/60   Pulse (!) 105   Temp 98.4 F (36.9 C) (Rectal)   Resp 13   Ht '5\' 4"'$  (4.854 m)   Wt 109.3 kg   LMP 07/07/2014 (Approximate)   SpO2 100%   BMI 41.37 kg/m   Intake/Output Summary (Last 24  hours) at 05/01/2022 0745 Last data filed at 05/01/2022 0600 Gross per 24 hour  Intake 10262.47 ml  Output 754 ml  Net 9508.47 ml   Weight change:   Physical Exam: Gen: Intubated, sedated, appears comfortable CVS: Pulse regular tachycardia, S1 and S2 normal Resp: Anteriorly clear to auscultation, no distinct rales or rhonchi Abd: Soft, moderately distended, no bowel sounds audible.  Intact laparoscopic surgery incision sites Ext: Extremities cool to touch, trace ankle edema  Imaging: DG CHEST PORT 1 VIEW  Result Date: 04/30/2022 CLINICAL DATA:  Mental status change EXAM: PORTABLE CHEST 1 VIEW COMPARISON:  Chest x-ray dated April 30, 2022 FINDINGS: Stable position of ET tube, right arm PICC, and left IJ line. Enteric tube with tip over the proximal stomach and side port near the GE junction, recommend advancement for optimal positioning. Cardiac and mediastinal contours are unchanged. Similar retrocardiac opacities. Increased right lower lung perihilar opacities. No evidence of large pleural effusion or pneumothorax. IMPRESSION: 1. Enteric tube with tip over the proximal stomach and side port near the GE junction, recommend advancement for optimal positioning. 2. Increased right lower lung perihilar opacities, may represent atelectasis or infection/aspiration. 3. Similar left retrocardiac opacities. Electronically Signed   By: Yetta Glassman M.D.   On: 04/30/2022 16:15   ECHOCARDIOGRAM COMPLETE  Result Date: 04/30/2022    ECHOCARDIOGRAM REPORT   Patient Name:   Patricia [name] [facility] Date of Exam: 04/30/2022 Medical Rec #:  [id number]          Height:       64.0 in Accession #:    [id number]         Weight:       241.0  lb Date of Birth:  1970-09-26          BSA:          2.118 m Patient Age:    51 years           BP:           96/63 mmHg Patient Gender: F                  HR:           125 bpm. Exam Location:  Inpatient Procedure: 2D Echo, Cardiac Doppler and Color Doppler Indications:    Bacteremia   History:        Patient has prior history of Echocardiogram examinations, most                 recent 07/20/2014. Sepsis; Risk Factors:Hypertension and                 Diabetes.  Sonographer:    Johny Chess RDCS Referring Phys: [place] Comments: Echo performed with patient supine and on artificial respirator. Image acquisition challenging due to patient body habitus. IMPRESSIONS  1. Left ventricular ejection fraction, by estimation, is 30 to 35%. The left ventricle has moderately decreased function. The left ventricle demonstrates global hypokinesis. Indeterminate diastolic filling due to E-A fusion.  2. Right ventricular systolic function is moderately reduced. The right ventricular size is normal. The estimated right ventricular systolic pressure is 41.7 mmHg.  3. Left atrial size was mildly dilated.  4. The mitral valve is normal in structure. Mild to moderate mitral valve regurgitation.  5. The aortic valve is tricuspid. Aortic valve regurgitation is not visualized. No aortic stenosis is present.  6. The inferior vena cava is collapsed throughout the respiratory cycle, which is unusual during mechanical ventilation and suggests low right atrial pressure/hypovolemia. Comparison(s): Prior images unable to be directly viewed, comparison made by report only. Conclusion(s)/Recommendation(s): No evidence of valvular vegetations on this transthoracic echocardiogram. Consider a transesophageal echocardiogram to exclude infective endocarditis if clinically indicated. Flow velocities are low across all valves consistent with reduced cardiac output. Collapsed IVC suggests hypovolemia in addition to biventricular systolic dysfunction. FINDINGS  Left Ventricle: Left ventricular ejection fraction, by estimation, is 30 to 35%. The left ventricle has moderately decreased function. The left ventricle demonstrates global hypokinesis. The left ventricular internal cavity size was normal in size. There  is no left ventricular hypertrophy. Indeterminate diastolic filling due to E-A fusion. Right Ventricle: The right ventricular size is normal. No increase in right ventricular wall thickness. Right ventricular systolic function is moderately reduced. The tricuspid regurgitant velocity is 2.24 m/s, and with an assumed right atrial pressure of 0 mmHg, the estimated right ventricular systolic pressure is 40.8 mmHg. Left Atrium: Left atrial size was mildly dilated. Right Atrium: Right atrial size was normal in size. Pericardium: There is no evidence of pericardial effusion. Mitral Valve: The mitral valve is normal in structure. Mild to moderate mitral valve regurgitation, with centrally-directed jet. Tricuspid Valve: The tricuspid valve is normal in structure. Tricuspid valve regurgitation is mild. Aortic Valve: The aortic valve is tricuspid. Aortic valve regurgitation is not visualized. No aortic stenosis is present. Pulmonic Valve: The pulmonic valve was not well visualized. Pulmonic valve regurgitation is not visualized. Aorta: The aortic root and ascending aorta are structurally normal, with no evidence of dilitation. Venous: The inferior vena cava is collapsed throughout the respiratory cycle, which is unusual during mechanical ventilation and suggests low  right atrial pressure/hypovolemia. IAS/Shunts: No atrial level shunt detected by color flow Doppler.  LEFT VENTRICLE PLAX 2D LVIDd:         4.90 cm LVIDs:         4.10 cm LV PW:         1.00 cm LV IVS:        0.80 cm LVOT diam:     1.90 cm LVOT Area:     2.84 cm  RIGHT VENTRICLE RV S prime:     8.16 cm/s TAPSE (M-mode): 1.0 cm LEFT ATRIUM           Index        RIGHT ATRIUM          Index LA diam:      3.90 cm 1.84 cm/m   RA Area:     8.10 cm LA Vol (A2C): 40.2 ml 18.98 ml/m  RA Volume:   14.20 ml 6.71 ml/m LA Vol (A4C): 45.3 ml 21.39 ml/m   AORTA Ao Root diam: 3.30 cm Ao Asc diam:  3.50 cm TRICUSPID VALVE TR Peak grad:   20.1 mmHg TR Vmax:        224.00  cm/s  SHUNTS Systemic Diam: 1.90 cm Mihai Croitoru MD Electronically signed by Sanda Klein MD Signature Date/Time: 04/30/2022/11:08:35 AM    Final    DG Abd Portable 1V  Result Date: 04/30/2022 CLINICAL DATA:  OG tube placement EXAM: PORTABLE ABDOMEN - 1 VIEW COMPARISON:  KUB 1 day prior and same day CT chest/abdomen/pelvis FINDINGS: The enteric catheter is coiled in the stomach with the tip projecting over the region of the pylorus. There is diffuse gaseous distention of the bowel as seen on same day CT chest/abdomen/pelvis. IMPRESSION: Enteric catheter coiled in the stomach. Electronically Signed   By: Valetta Mole M.D.   On: 04/30/2022 10:22   DG CHEST PORT 1 VIEW  Result Date: 04/30/2022 CLINICAL DATA:  [id number]. Encounter for aspiration with ventilator dependent respiratory failure. EXAM: PORTABLE CHEST 1 VIEW COMPARISON:  Earlier portable chest today at 3:39 a.m. FINDINGS: 5:30 a.m. ETT tip is 2.9 cm from the carina. NGT enters the stomach but the tip is only a few cm in the stomach and should be advanced further in. Right PICC terminates in the distal SVC with left IJ line terminating in the upper right atrium. There is overlying monitor wiring. There are linear atelectatic bands in the left lung base as well as streaky infrahilar opacity which could be additional atelectasis or pneumonia. There is right mid perihilar linear atelectasis. Remaining lungs remain clear. There is cardiomegaly without evidence of CHF. Stable mediastinum with aortic tortuosity and ectasia. No acute osseous abnormality. IMPRESSION: Atelectasis or infiltrate in the left infrahilar area with linear atelectasis in the left base, right mid field. Overall aeration seems unchanged. NGT proximal side hole remains in the distal esophagus and should be advanced further in. Stable cardiomegaly. Electronically Signed   By: Telford Nab M.D.   On: 04/30/2022 07:29   CT CHEST ABDOMEN PELVIS WO CONTRAST  Result Date:  04/30/2022 CLINICAL DATA:  Bowel obstruction suspected. Worsening shock with lactate acidosis and bowel obstruction. EXAM: CT CHEST, ABDOMEN AND PELVIS WITHOUT CONTRAST TECHNIQUE: Multidetector CT imaging of the chest, abdomen and pelvis was performed following the standard protocol without IV contrast. RADIATION DOSE REDUCTION: This exam was performed according to the departmental dose-optimization program which includes automated exposure control, adjustment of the mA and/or kV according to patient size and/or use of  iterative reconstruction technique. COMPARISON:  Abdominal CT from yesterday FINDINGS: CT CHEST FINDINGS Cardiovascular: Normal heart size. No pericardial effusion. Small volume gas in the right ventricle usually from IV access. Left IJ and right PICC with tips in expected position at the upper cavoatrial junction. Mediastinum/Nodes: Fluid dilated esophagus containing contrast. No adenopathy. Lungs/Pleura: Endotracheal tube in good position with tip above the carina. Worsening atelectasis at the lung bases. Patchy airspace opacity in the right middle lobe. Musculoskeletal: No acute finding. Scoliosis. Chronic left medial clavicle and first rib deformities. CT ABDOMEN PELVIS FINDINGS Hepatobiliary: Hepatic steatosis.No evidence of biliary obstruction or stone. Pancreas: Unremarkable. Spleen: Unremarkable. Adrenals/Urinary Tract: Negative adrenals. No hydronephrosis or stone. Unremarkable bladder. Stomach/Bowel: Dilated and fluid-filled small bowel with transition point in the distal bowel at the level of the pelvis where there is distortion of bowel loop suggesting scarring. Entero-enteric anastomosis is seen in the right lower quadrant. Mild mesenteric edema associated with the obstructed segments. Vascular/Lymphatic: No acute vascular abnormality. No mass or adenopathy. Reproductive:Hysterectomy Other: Ventral abdominal wall scarring with low-density component attributed to recent hernia repair.  Anasarca. Few bubbles of subcutaneous gas presumably from recent surgery. No visible hernia. Musculoskeletal: No acute abnormalities. IMPRESSION: 1. Continued small bowel obstruction with transition in the pelvis where there is scarring. 2. Worsening atelectasis at the lung bases. Right middle lobe aspiration pneumonitis/pneumonia. The esophagus is diffusely fluid-filled. Electronically Signed   By: Jorje Guild M.D.   On: 04/30/2022 06:52   DG CHEST PORT 1 VIEW  Result Date: 04/30/2022 CLINICAL DATA:  Check central line placement EXAM: PORTABLE CHEST 1 VIEW COMPARISON:  04/30/2022 FINDINGS: Endotracheal tube and gastric catheter are noted. Gastric catheter should be advanced as the proximal side port lies in the distal esophagus. Left jugular central line is now seen in the mid superior vena cava. No pneumothorax is noted. Right PICC is noted in satisfactory position. Lungs are well aerated. Left basilar atelectasis is noted similar to that seen on the prior exam. IMPRESSION: No pneumothorax following central line placement on the left. Gastric catheter should be advanced distally into the stomach. Stable left basilar atelectasis. Electronically Signed   By: Inez Catalina M.D.   On: 04/30/2022 03:50   Portable Chest x-ray  Result Date: 04/30/2022 CLINICAL DATA:  [id number].  Endotracheal tube placement film. EXAM: PORTABLE CHEST 1 VIEW COMPARISON:  Portable chest yesterday at 2:22 p.m. FINDINGS: 2:48 a.m. ETT is in place now with tip 2 cm from the carina. Right PICC tip at the superior cavoatrial junction. NGT is in place, again with the proximal side hole in the distal esophagus and should be advanced into the stomach for continued use. The cardiac size is stable.  Central vessels are normal caliber. The mediastinal configuration is normal.  The lungs are expiratory. There is increased linear atelectasis in both bases and streaky increased left infrahilar lower lobe opacity which could be atelectasis, new  infiltrate or aspiration. The upper lung fields are entirely clear. No pleural effusion is seen. Multiple overlying monitor wires. IMPRESSION: Support devices as above.  ETT tip is 2 cm from the carina. NGT proximal side hole in the distal esophagus and should be advanced into the stomach for continued use. Expiratory exam with increased linear basilar atelectatic foci and increased streaky left infrahilar lower lobe opacity which could be atelectasis, pneumonia or aspiration. Stable mild cardiomegaly. Electronically Signed   By: Telford Nab M.D.   On: 04/30/2022 02:53   VAS [place] LOWER EXTREMITY VENOUS (DVT)  Result  Date: 04/29/2022  Lower Venous DVT Study Patient Name:  Patricia [name] [facility]  Date of Exam:   04/29/2022 Medical Rec #: [id number]           Accession #:    [id number] Date of Birth: [birth year]           Patient Gender: F Patient Age:   73 years Exam Location:  [address] [facility] Procedure:      VAS [place] LOWER EXTREMITY VENOUS (DVT) Referring Phys: MICHAEL MACZIS --------------------------------------------------------------------------------  Indications: SOB, and Edema.  Comparison Study: No prior study Performing Technologist: Sharion Dove RVS  Examination Guidelines: A complete evaluation includes B-mode imaging, spectral Doppler, color Doppler, and power Doppler as needed of all accessible portions of each vessel. Bilateral testing is considered an integral part of a complete examination. Limited examinations for reoccurring indications may be performed as noted. The reflux portion of the exam is performed with the patient in reverse Trendelenburg.  +---------+---------------+---------+-----------+----------+--------------+ RIGHT    CompressibilityPhasicitySpontaneityPropertiesThrombus Aging +---------+---------------+---------+-----------+----------+--------------+ CFV      Full           Yes      Yes                                  +---------+---------------+---------+-----------+----------+--------------+ SFJ      Full                                                        +---------+---------------+---------+-----------+----------+--------------+ FV Prox  Full                                                        +---------+---------------+---------+-----------+----------+--------------+ FV Mid   Full                                                        +---------+---------------+---------+-----------+----------+--------------+ FV DistalFull                                                        +---------+---------------+---------+-----------+----------+--------------+ PFV      Full                                                        +---------+---------------+---------+-----------+----------+--------------+ POP      Full           Yes      Yes                                 +---------+---------------+---------+-----------+----------+--------------+ PTV      Full                                                        +---------+---------------+---------+-----------+----------+--------------+  PERO     Full                                                        +---------+---------------+---------+-----------+----------+--------------+   +---------+---------------+---------+-----------+----------+--------------+ LEFT     CompressibilityPhasicitySpontaneityPropertiesThrombus Aging +---------+---------------+---------+-----------+----------+--------------+ CFV      Full           Yes      Yes                                 +---------+---------------+---------+-----------+----------+--------------+ SFJ      Full                                                        +---------+---------------+---------+-----------+----------+--------------+ FV Prox  Full                                                         +---------+---------------+---------+-----------+----------+--------------+ FV Mid   Full                                                        +---------+---------------+---------+-----------+----------+--------------+ FV DistalFull                                                        +---------+---------------+---------+-----------+----------+--------------+ PFV      Full                                                        +---------+---------------+---------+-----------+----------+--------------+ POP      Full           Yes      Yes                                 +---------+---------------+---------+-----------+----------+--------------+ PTV      Full                                                        +---------+---------------+---------+-----------+----------+--------------+ PERO     Full                                                        +---------+---------------+---------+-----------+----------+--------------+  Summary: BILATERAL: - No evidence of deep vein thrombosis seen in the lower extremities, bilaterally. -No evidence of popliteal cyst, bilaterally.   *See table(s) above for measurements and observations. Electronically signed by Servando Snare MD on 04/29/2022 at 7:51:28 PM.    Final    DG CHEST PORT 1 VIEW  Result Date: 04/29/2022 CLINICAL DATA:  Status post PICC central line placement. EXAM: PORTABLE CHEST 1 VIEW COMPARISON:  Same day chest radiograph at 1131 hours; same-day CT abdomen pelvis at 0748 hours FINDINGS: Interval repositioning of the right upper extremity PICC with the tip now projecting at the level of the superior cavoatrial junction. Nasogastric tube tip is below the diaphragm with the side hole projecting at the level of the distal esophagus. Bibasilar subsegmental atelectasis as seen on same day CT abdomen pelvis. No new focal consolidation. No pleural effusion or pneumothorax. Heart is normal in size. IMPRESSION: Interval  repositioning of the right upper extremity PICC with the tip now projecting at the level of the superior cavoatrial junction. Bibasilar subsegmental atelectasis, similar to earlier exam. Electronically Signed   By: Ileana Roup M.D.   On: 04/29/2022 14:41   DG Chest Port 1 View  Result Date: 04/29/2022 CLINICAL DATA:  PICC line placement EXAM: PORTABLE CHEST 1 VIEW COMPARISON:  April 10, 2022 FINDINGS: A right PICC line has been placed in the interval. A slight ir in the distal PICC line suggests the PICC line may extend slightly into the azygous vein. An NG tube terminates in the stomach. Infiltrate is identified in the left base. No other interval changes or acute abnormalities. IMPRESSION: 1. A right PICC line has been placed in the interval. The distal PICC line may extend slightly into the azygous vein. Consider repositioning. 2. Left basilar infiltrate. Electronically Signed   By: Dorise Bullion III M.D.   On: 04/29/2022 11:48   [place] EKG SITE RITE  Result Date: 04/29/2022 If Site Rite image not attached, placement could not be confirmed due to current cardiac rhythm.  CT ABDOMEN PELVIS WO CONTRAST  Result Date: 04/29/2022 CLINICAL DATA:  51 year old female with suspected bowel obstruction. EXAM: CT ABDOMEN AND PELVIS WITHOUT CONTRAST TECHNIQUE: Multidetector CT imaging of the abdomen and pelvis was performed following the standard protocol without IV contrast. RADIATION DOSE REDUCTION: This exam was performed according to the departmental dose-optimization program which includes automated exposure control, adjustment of the mA and/or kV according to patient size and/or use of iterative reconstruction technique. COMPARISON:  CT of the abdomen and pelvis 02/07/2022. FINDINGS: Lower chest: Opacities in the dependent portions of the lower lobes of the lungs bilaterally, may reflect atelectasis and/or developing scarring. Nasogastric tube extending into the stomach. Hepatobiliary: Diffuse low  attenuation throughout the hepatic parenchyma, indicative of a background of hepatic steatosis. No definite suspicious cystic or solid hepatic lesions are confidently identified on today's noncontrast CT examination. Unenhanced appearance of the gallbladder is normal. Pancreas: No definite pancreatic mass or peripancreatic fluid collections or inflammatory changes are noted on today's noncontrast CT examination. Spleen: Unremarkable. Adrenals/Urinary Tract: Unenhanced appearance of the kidneys, bilateral adrenal glands and urinary bladder is normal. No hydroureteronephrosis. Stomach/Bowel: Nasogastric tube extending into the mid body of the stomach. Numerous dilated loops of small bowel measuring up to 4.6 cm in diameter, with multiple air-fluid levels. There is a suture line in the anterior aspect of the lower abdomen, presumably from prior partial small bowel resection. An exact transition point is not confidently identified, however, the distal ileum appears completely decompressed,  suggesting a transition point in the distal small bowel. Appendix is not confidently identified may be surgically absent. Small amount of gas and stool noted in the colon. Vascular/Lymphatic: Atherosclerotic calcifications in the abdominal aorta. No definite lymphadenopathy noted in the abdomen or pelvis. Reproductive: Status post total abdominal hysterectomy and bilateral salpingo-oophorectomy. Other: No significant volume of ascites.  No pneumoperitoneum. Musculoskeletal: Soft tissue stranding in the subcutaneous fat of the right anterior abdominal wall. There are no aggressive appearing lytic or blastic lesions noted in the visualized portions of the skeleton. IMPRESSION: 1. Findings are concerning for small bowel obstruction, likely with transition point in the distal small bowel, as above. Surgical consultation is recommended. 2. Interval development of dependent opacities in the lower lobes of the lungs bilaterally, which may  reflect areas of atelectasis and/or developing scarring. 3. Hepatic steatosis. 4. Aortic atherosclerosis. 5. Additional incidental findings, as above. These results will be called to the ordering clinician or representative by the Radiologist Assistant, and communication documented in the PACS or [employer]. Electronically Signed   By: Vinnie Langton M.D.   On: 04/29/2022 08:01    Labs: BMET Recent Labs  Lab 04/25/22 1002 04/27/22 0233 04/28/22 0440 04/29/22 0031 04/29/22 0926 04/29/22 1642 04/30/22 0055 04/30/22 0144 04/30/22 0323 04/30/22 1023 04/30/22 1301 04/30/22 2108 05/01/22 0416  NA 142 141 141 138  --    < > 142 141 142 136 139 138 133*  K 3.8 3.5 3.4* 3.6  --    < > 4.2 4.2 4.1 4.3 4.7 3.6 3.4*  CL 106 104 103 106  --   --  109  --   --   --  102  --  97*  CO2 '24 25 23 '$ 21*  --   --  11*  --   --   --  18*  --  15*  GLUCOSE 131* 152* 104* 134*  --   --  132*  --   --   --  189*  --  190*  BUN '10 13 12 13  '$ --   --  45*  --   --   --  54*  --  71*  CREATININE 0.93 0.84 0.94 0.94  --   --  4.68*  --   --   --  4.70*  --  6.01*  CALCIUM 9.3 9.0 9.6 8.9  --   --  7.4*  --   --   --  6.0*  --  6.6*  PHOS  --   --   --   --  2.2*  --  3.2  --   --   --   --   --  6.3*   < > = values in this interval not displayed.   CBC Recent Labs  Lab 04/28/22 0440 04/29/22 0031 04/29/22 1642 04/30/22 0037 04/30/22 0144 04/30/22 0323 04/30/22 1023 04/30/22 2108 05/01/22 0416  WBC 7.5 5.2  --  7.7  --   --   --   --  10.0  NEUTROABS  --   --   --  5.8  --   --   --   --   --   HGB 10.6* 12.2   < > 11.3*   < > 11.2* 10.9* 10.2* 11.4*  HCT 33.0* 39.2   < > 35.6*   < > 33.0* 32.0* 30.0* 34.4*  MCV 84.8 85.6  --  84.6  --   --   --   --  82.3  PLT 271 422*  --  437*  --   --   --   --  129*   < > = values in this interval not displayed.    Medications:     Chlorhexidine Gluconate Cloth  6 each Topical Daily   docusate  100 mg Per Tube BID   heparin injection  (subcutaneous)  5,000 Units Subcutaneous Q8H   hydrocortisone sod succinate (SOLU-CORTEF) inj  100 mg Intravenous Q12H   insulin aspart  0-9 Units Subcutaneous TID WC   mouth rinse  15 mL Mouth Rinse Q2H   pantoprazole (PROTONIX) IV  40 mg Intravenous Q24H   polyethylene glycol  17 g Per Tube Daily   sodium chloride flush  10-40 mL Intracatheter Q12H   topiramate  25 mg Oral QHS   traZODone  100 mg Oral QHS   Elmarie Shiley, MD 05/01/2022, 7:45 AM

## 2022-05-01 NOTE — Interval H&P Note (Signed)
History and Physical Interval Note:  05/01/2022 8:41 PM  Patricia [name]  has presented today for surgery, with the diagnosis of STEMI.  The various methods of treatment have been discussed with the patient and family. After consideration of risks, benefits and other options for treatment, the patient has consented to  Procedure(s): Coronary/Graft Acute MI Revascularization (N/A) LEFT HEART CATH AND CORONARY ANGIOGRAPHY (N/A) as a surgical intervention.  The patient's history has been reviewed, patient examined, no change in status, stable for surgery.  I have reviewed the patient's chart and labs.  Questions were answered to the patient's satisfaction.     [name]

## 2022-05-01 NOTE — Progress Notes (Signed)
Dr Shearon Stalls notified of troponin 24,000. Will arrive to unit to notify. Will place order for heparin per pharmacy.
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
## 2022-05-01 NOTE — Progress Notes (Signed)
Since coming back from cath lab she has had dropping Bps- now back on NE, dobutamine still 5. Fentanyl increased due to vent dyssynchrony, but peak pressures are remaining <30 and she continues to get appropriate tidal volumes. Decrease sedation, con't NE PRN to maintain MAP 65-70. No intervention for sustained HR in 130s currently, sinus rhythm. Being reconnected to CRRT now. Metabolic acidosis may be contributing some to vent dyssynchrony due to increased respiratory drive.   Julian Hy, DO 05/01/22 11:19 PM [facility]

## 2022-05-01 NOTE — Progress Notes (Signed)
Sheath sutured in-place at 68 CM. Proximal and distal lumens transduced.

## 2022-05-01 NOTE — H&P (View-Only) (Signed)
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 COMPARISON:  Earlier portable chest today at 3:39 a.m. FINDINGS: 5:30 a.m. ETT tip is 2.9 cm from the carina. NGT enters the stomach but the tip is only a few cm in the stomach and should be advanced further in. Right PICC terminates in the distal SVC with left IJ line terminating in the upper right atrium. There is overlying monitor wiring. There are linear atelectatic bands in the left lung base as well as streaky infrahilar opacity which could be additional atelectasis or pneumonia. There is right mid perihilar linear atelectasis. Remaining lungs remain clear. There is cardiomegaly without evidence of CHF. Stable mediastinum with aortic tortuosity and ectasia. No acute osseous abnormality. IMPRESSION: Atelectasis or infiltrate in the left infrahilar area with linear atelectasis in the left base, right mid field. Overall aeration seems unchanged. NGT proximal  side hole remains in the distal esophagus and should be advanced further in. Stable cardiomegaly. Electronically Signed   By: Telford Nab M.D.   On: 04/30/2022 07:29   CT CHEST ABDOMEN PELVIS WO CONTRAST  Result Date: 04/30/2022 CLINICAL DATA:  Bowel obstruction suspected. Worsening shock with lactate acidosis and bowel obstruction. EXAM: CT CHEST, ABDOMEN AND PELVIS WITHOUT CONTRAST TECHNIQUE: Multidetector CT imaging of the chest, abdomen and pelvis was performed following the standard protocol without IV contrast. RADIATION DOSE REDUCTION: This exam was performed according to the departmental dose-optimization program which includes automated exposure control, adjustment of the mA and/or kV according to patient size and/or use of iterative reconstruction technique. COMPARISON:  Abdominal CT from yesterday FINDINGS: CT CHEST FINDINGS Cardiovascular: Normal heart size. No pericardial effusion. Small volume gas in the right ventricle usually from IV access. Left IJ and right PICC with tips in expected position at the upper cavoatrial junction. Mediastinum/Nodes: Fluid dilated esophagus containing contrast. No adenopathy. Lungs/Pleura: Endotracheal tube in good position with tip above the carina. Worsening atelectasis at the lung bases. Patchy airspace opacity in the right middle lobe. Musculoskeletal: No acute finding. Scoliosis. Chronic left medial clavicle and first rib deformities. CT ABDOMEN PELVIS FINDINGS Hepatobiliary: Hepatic steatosis.No evidence of biliary obstruction or stone. Pancreas: Unremarkable. Spleen: Unremarkable. Adrenals/Urinary Tract: Negative adrenals. No hydronephrosis or stone. Unremarkable bladder. Stomach/Bowel: Dilated and fluid-filled small bowel with transition point in the distal bowel at the level of the pelvis where there is distortion of bowel loop suggesting scarring. Entero-enteric anastomosis is seen in the right lower quadrant. Mild mesenteric edema associated with the  obstructed segments. Vascular/Lymphatic: No acute vascular abnormality. No mass or adenopathy. Reproductive:Hysterectomy Other: Ventral abdominal wall scarring with low-density component attributed to recent hernia repair. Anasarca. Few bubbles of subcutaneous gas presumably from recent surgery. No visible hernia. Musculoskeletal: No acute abnormalities. IMPRESSION: 1. Continued small bowel obstruction with transition in the pelvis where there is scarring. 2. Worsening atelectasis at the lung bases. Right middle lobe aspiration pneumonitis/pneumonia. The esophagus is diffusely fluid-filled. Electronically Signed   By: Jorje Guild M.D.   On: 04/30/2022 06:52   DG CHEST PORT 1 VIEW  Result Date: 04/30/2022 CLINICAL DATA:  Check central line placement EXAM: PORTABLE CHEST 1 VIEW COMPARISON:  04/30/2022 FINDINGS: Endotracheal tube and gastric catheter are noted. Gastric catheter should be advanced as the proximal side port lies in the distal esophagus. Left jugular central line is now seen in the mid superior vena cava. No pneumothorax is noted. Right PICC is noted in satisfactory position. Lungs are well aerated. Left basilar atelectasis is noted similar to that seen on the prior exam. IMPRESSION: No pneumothorax following central line placement on the left. Gastric catheter should be advanced distally into the stomach. Stable left basilar atelectasis. Electronically Signed   By: Inez Catalina M.D.   On: 04/30/2022 03:50   Portable Chest x-ray  Result Date: 04/30/2022 CLINICAL DATA:  [id number].  Endotracheal tube placement film. EXAM: PORTABLE CHEST 1 VIEW COMPARISON:  Portable chest yesterday at 2:22 p.m. FINDINGS: 2:48 a.m. ETT is in place now with tip 2 cm from the carina. Right PICC tip at the superior cavoatrial junction. NGT is in place, again with the proximal side hole in the distal esophagus and should be advanced into the stomach for continued use. The cardiac size is stable.  Central vessels are  normal caliber. The mediastinal configuration is normal.  The lungs are expiratory. There is increased linear atelectasis in both bases and  streaky increased left infrahilar lower lobe opacity which could be atelectasis, new infiltrate or aspiration. The upper lung fields are entirely clear. No pleural effusion is seen. Multiple overlying monitor wires. IMPRESSION: Support devices as above.  ETT tip is 2 cm from the carina. NGT proximal side hole in the distal esophagus and should be advanced into the stomach for continued use. Expiratory exam with increased linear basilar atelectatic foci and increased streaky left infrahilar lower lobe opacity which could be atelectasis, pneumonia or aspiration. Stable mild cardiomegaly. Electronically Signed   By: Telford Nab M.D.   On: 04/30/2022 02:53   VAS [place] LOWER EXTREMITY VENOUS (DVT)  Result Date: 04/29/2022  Lower Venous DVT Study Patient Name:  Patricia [name] [facility]  Date of Exam:   04/29/2022 Medical Rec #: [id number]           Accession #:    [id number] Date of Birth: 05/12/1971           Patient Gender: F Patient Age:   53 years Exam Location:  [facility] Procedure:      VAS [place] LOWER EXTREMITY VENOUS (DVT) Referring Phys: MICHAEL MACZIS --------------------------------------------------------------------------------  Indications: SOB, and Edema.  Comparison Study: No prior study Performing Technologist: Sharion Dove RVS  Examination Guidelines: A complete evaluation includes B-mode imaging, spectral Doppler, color Doppler, and power Doppler as needed of all accessible portions of each vessel. Bilateral testing is considered an integral part of a complete examination. Limited examinations for reoccurring indications may be performed as noted. The reflux portion of the exam is performed with the patient in reverse Trendelenburg.  +---------+---------------+---------+-----------+----------+--------------+ RIGHT     CompressibilityPhasicitySpontaneityPropertiesThrombus Aging +---------+---------------+---------+-----------+----------+--------------+ CFV      Full           Yes      Yes                                 +---------+---------------+---------+-----------+----------+--------------+ SFJ      Full                                                        +---------+---------------+---------+-----------+----------+--------------+ FV Prox  Full                                                        +---------+---------------+---------+-----------+----------+--------------+ FV Mid   Full                                                        +---------+---------------+---------+-----------+----------+--------------+ FV DistalFull                                                        +---------+---------------+---------+-----------+----------+--------------+ PFV      Full                                                        +---------+---------------+---------+-----------+----------+--------------+  POP      Full           Yes      Yes                                 +---------+---------------+---------+-----------+----------+--------------+ PTV      Full                                                        +---------+---------------+---------+-----------+----------+--------------+ PERO     Full                                                        +---------+---------------+---------+-----------+----------+--------------+   +---------+---------------+---------+-----------+----------+--------------+ LEFT     CompressibilityPhasicitySpontaneityPropertiesThrombus Aging +---------+---------------+---------+-----------+----------+--------------+ CFV      Full           Yes      Yes                                 +---------+---------------+---------+-----------+----------+--------------+ SFJ      Full                                                         +---------+---------------+---------+-----------+----------+--------------+ FV Prox  Full                                                        +---------+---------------+---------+-----------+----------+--------------+ FV Mid   Full                                                        +---------+---------------+---------+-----------+----------+--------------+ FV DistalFull                                                        +---------+---------------+---------+-----------+----------+--------------+ PFV      Full                                                        +---------+---------------+---------+-----------+----------+--------------+ POP      Full           Yes      Yes                                 +---------+---------------+---------+-----------+----------+--------------+  PTV      Full                                                        +---------+---------------+---------+-----------+----------+--------------+ PERO     Full                                                        +---------+---------------+---------+-----------+----------+--------------+     Summary: BILATERAL: - No evidence of deep vein thrombosis seen in the lower extremities, bilaterally. -No evidence of popliteal cyst, bilaterally.   *See table(s) above for measurements and observations. Electronically signed by Servando Snare MD on 04/29/2022 at 7:51:28 PM.    Final    DG CHEST PORT 1 VIEW  Result Date: 04/29/2022 CLINICAL DATA:  Status post PICC central line placement. EXAM: PORTABLE CHEST 1 VIEW COMPARISON:  Same day chest radiograph at 1131 hours; same-day CT abdomen pelvis at 0748 hours FINDINGS: Interval repositioning of the right upper extremity PICC with the tip now projecting at the level of the superior cavoatrial junction. Nasogastric tube tip is below the diaphragm with the side hole projecting at the level of the distal esophagus. Bibasilar  subsegmental atelectasis as seen on same day CT abdomen pelvis. No new focal consolidation. No pleural effusion or pneumothorax. Heart is normal in size. IMPRESSION: Interval repositioning of the right upper extremity PICC with the tip now projecting at the level of the superior cavoatrial junction. Bibasilar subsegmental atelectasis, similar to earlier exam. Electronically Signed   By: Ileana Roup M.D.   On: 04/29/2022 14:41   DG Chest Port 1 View  Result Date: 04/29/2022 CLINICAL DATA:  PICC line placement EXAM: PORTABLE CHEST 1 VIEW COMPARISON:  April 10, 2022 FINDINGS: A right PICC line has been placed in the interval. A slight ir in the distal PICC line suggests the PICC line may extend slightly into the azygous vein. An NG tube terminates in the stomach. Infiltrate is identified in the left base. No other interval changes or acute abnormalities. IMPRESSION: 1. A right PICC line has been placed in the interval. The distal PICC line may extend slightly into the azygous vein. Consider repositioning. 2. Left basilar infiltrate. Electronically Signed   By: Dorise Bullion III M.D.   On: 04/29/2022 11:48   [place] EKG SITE RITE  Result Date: 04/29/2022 If Site Rite image not attached, placement could not be confirmed due to current cardiac rhythm.  CT ABDOMEN PELVIS WO CONTRAST  Result Date: 04/29/2022 CLINICAL DATA:  51 year old female with suspected bowel obstruction. EXAM: CT ABDOMEN AND PELVIS WITHOUT CONTRAST TECHNIQUE: Multidetector CT imaging of the abdomen and pelvis was performed following the standard protocol without IV contrast. RADIATION DOSE REDUCTION: This exam was performed according to the departmental dose-optimization program which includes automated exposure control, adjustment of the mA and/or kV according to patient size and/or use of iterative reconstruction technique. COMPARISON:  CT of the abdomen and pelvis 02/07/2022. FINDINGS: Lower chest: Opacities in the dependent portions  of the lower lobes of the lungs bilaterally, may reflect atelectasis and/or developing scarring. Nasogastric tube extending into the stomach. Hepatobiliary: Diffuse low attenuation throughout the hepatic parenchyma, indicative of  a background of hepatic steatosis. No definite suspicious cystic or solid hepatic lesions are confidently identified on today's noncontrast CT examination. Unenhanced appearance of the gallbladder is normal. Pancreas: No definite pancreatic mass or peripancreatic fluid collections or inflammatory changes are noted on today's noncontrast CT examination. Spleen: Unremarkable. Adrenals/Urinary Tract: Unenhanced appearance of the kidneys, bilateral adrenal glands and urinary bladder is normal. No hydroureteronephrosis. Stomach/Bowel: Nasogastric tube extending into the mid body of the stomach. Numerous dilated loops of small bowel measuring up to 4.6 cm in diameter, with multiple air-fluid levels. There is a suture line in the anterior aspect of the lower abdomen, presumably from prior partial small bowel resection. An exact transition point is not confidently identified, however, the distal ileum appears completely decompressed, suggesting a transition point in the distal small bowel. Appendix is not confidently identified may be surgically absent. Small amount of gas and stool noted in the colon. Vascular/Lymphatic: Atherosclerotic calcifications in the abdominal aorta. No definite lymphadenopathy noted in the abdomen or pelvis. Reproductive: Status post total abdominal hysterectomy and bilateral salpingo-oophorectomy. Other: No significant volume of ascites.  No pneumoperitoneum. Musculoskeletal: Soft tissue stranding in the subcutaneous fat of the right anterior abdominal wall. There are no aggressive appearing lytic or blastic lesions noted in the visualized portions of the skeleton. IMPRESSION: 1. Findings are concerning for small bowel obstruction, likely with transition point in the  distal small bowel, as above. Surgical consultation is recommended. 2. Interval development of dependent opacities in the lower lobes of the lungs bilaterally, which may reflect areas of atelectasis and/or developing scarring. 3. Hepatic steatosis. 4. Aortic atherosclerosis. 5. Additional incidental findings, as above. These results will be called to the ordering clinician or representative by the Radiologist Assistant, and communication documented in the PACS or [employer]. Electronically Signed   By: Vinnie Langton M.D.   On: 04/29/2022 08:01   DG Abd 1 View  Result Date: 04/29/2022 CLINICAL DATA:  Check gastric catheter placement EXAM: ABDOMEN - 1 VIEW COMPARISON:  None Available. FINDINGS: Gastric catheter is noted within the stomach. Persistent small bowel dilatation is noted. IMPRESSION: Gastric catheter within the stomach. Electronically Signed   By: Inez Catalina M.D.   On: 04/29/2022 03:09   DG Abd 1 View  Result Date: 04/29/2022 CLINICAL DATA:  NG tube placement. EXAM: ABDOMEN - 1 VIEW COMPARISON:  04/28/2022. FINDINGS: There is a dilated loop of small bowel in the upper abdomen measuring up to 4.0 cm. An NG tube terminates in the stomach at the gastric fundus. No radio-opaque calculi or other significant radiographic abnormality are seen. Mild atelectasis is present at the left lung base. IMPRESSION: 1. Dilated loop of small bowel in the upper abdomen measuring up to 4 cm, possible ileus versus partial or early obstruction. 2. Enteric tube terminates in the stomach. Electronically Signed   By: Brett Fairy M.D.   On: 04/29/2022 01:55   DG Abd Portable 1V  Result Date: 04/28/2022 CLINICAL DATA:  [id number] in G-tube placement EXAM: PORTABLE ABDOMEN - 1 VIEW COMPARISON:  September 13th 2023 radiograph FINDINGS: Magdalene Molly is not visualized, limiting assessment. Enteric tube tip terminates over the expected region of the proximal stomach. There are dilated loops of small bowel within the upper  abdomen. Bibasilar atelectasis. Incomplete visualization of the pelvis. IMPRESSION: 1. Enteric tube terminates over the expected region of the proximal stomach. 2. Diffuse gaseous dilation of multiple loops of small bowel. Differential considerations include small-bowel obstruction or ileus. Consider further evaluation with CT abdomen pelvis  with contrast if concern for obstruction. Electronically Signed   By: Valentino Saxon M.D.   On: 04/28/2022 17:15     Assessment and Plan:   STEMI on EKG pt intubated and unknown pain. She is sedated.  With hypotension, on norepinephrine though decreasing dose.AKI new this admit. Infection with elevated procalcitionin >150.  Dr Margaretann Loveless and Dr Ali Lowe evaluating pt   rule out pericarditis. 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  Septic shock due to intra-abdominal infection vs. Aspiration PNA, s/p hernia repair c/b ileus per CCM and surgery Acute respiratory failure, hypoxia on vent per CCM AKI with high anion gap metabolic acidosis.  Beginning CRRT per nephrology DM-2 per CCM Anemia/chronic low back pain/BIpolar disorder/ HTN per CCM     Risk Assessment/Risk Scores:     TIMI Risk Score for ST  Elevation MI:   The patient's TIMI risk score is  , which indicates a  % risk of all cause mortality at 30 days.           For questions or updates, please contact [place] Please consult [email or web address] for contact info under    Signed, Cecilie Kicks, NP  05/01/2022 7:01 PM  ---------------------------------------------------------------------------------------------   History and all data above reviewed.  Patient examined.  I agree with the findings as above.  Patricia [name] is a 51 yo female with no known cardiovascular history on whom we were consulted for STEMI.  Patient underwent hernia repair on admission and subsequently had ileus with vomiting and likely aspiration pneumonia, with subsequent septic shock, significant  pressor requirement, and lactic acidosis.  Abdominal imaging was pursued without IV contrast given contrast allergy, no definite findings of ischemic or necrotic bowel, small bowel obstruction noted at a point of scarring.  Right middle lobe aspiration pneumonitis/pneumonia noted.  Patient had worsening renal function and continued acidosis and was oliguric today prompting initiation of CRRT, per bedside nurse this initiated at 1 PM.  At approximately 3 PM on telemetry review ST elevations were noted, these were pursued approximately 4 PM, EKG performed concerning for ST elevation MI.  Critical care medicine notified.  We were contacted for notification of STEMI at [place].  ST elevations are seen inferiorly and laterally.  I presented urgently to the bedside and discussed patient's care with bedside nursing team both day and evening shift were present.  Given concern of new EKG changes, I performed a bedside echocardiogram.  LV ejection fraction 15% with septal regional wall motion abnormality.  At least moderate RV dysfunction.  Valve function was unable to be assessed on limited echocardiogram, mitral and aortic valves appear grossly normal.  Trivial pericardial fluid.  I compared these images to the images of yesterday's echocardiogram.  LVEF by my assessment was approximately 20 to 25% with tachycardia, similar appearing regionals were present though in the setting of global severe hypokinesis and were subtle.  I reviewed her CT chest abdomen pelvis, no coronary calcifications.  I contacted interventional cardiology, who also urgently presented to the bedside for patient evaluation.  Constitutional: intubated and sedated ENMT: ETT and OG tube in place. Cardiovascular: regular rhythm, normal rate, no murmurs. S1 and S2 normal. Radial pulses 1/4 bilat.  Respiratory: clear on vent GI : firm abdomen MSK: extremities cool.  NEURO/PSYCH: intubated and sedated, unable to assess. Opens eyes.   All  available labs, radiology testing, previous records reviewed. Agree with documented assessment and plan of my colleague as stated above with the following  additions or changes:  Principal Problem:   Septic shock (HCC) Active Problems:   Diabetes mellitus type 2 in obese ([place])   BIPOLAR DISORDER UNSPECIFIED   Morbid obesity (HCC)   Chronic low back pain   Hernia of abdominal cavity   Aspiration pneumonia (HCC)   Prolonged QT interval   Essential hypertension    Plan: Discussion with CCM PA, pharmacy, bedside nursing, interventional cardiology performed.  Decision was made given patient's young age and seemingly newly reduced ejection fraction with severely elevated troponin and ST elevations to pursue cardiac catheterization emergently.  Dr. Ali Lowe, interventional cardiology contacted patient's sister who is her medical POA and requested consent for cardiac catheterization, family agreed.  Risks and benefits discussed in detail with family.  -Patient has a contrast allergy, will premedicate -Therapeutic IV heparin initiated prior to transfer to the Cath Lab -Extended use of [id number] inhibitor will likely be necessary given patient's ileus and concern if intervention performed. -Discussed with critical care medicine, CRRT will need to be paused for procedure.   Length of Stay:  LOS: 7 days   Elouise Munroe, MD [facility] 8:10 PM  05/01/2022   CRITICAL CARE Performed by: Cherlynn Kaiser, MD   Total critical care time: 60 minutes   Critical care time was exclusive of separately billable procedures and treating other patients.   Critical care was necessary to treat or prevent imminent or life-threatening deterioration.   Critical care was time spent personally by me (independent of APPs or residents) on the following activities: development of treatment plan with patient and/or surrogate as well as nursing, discussions with consultants, evaluation of patient's response to  treatment, examination of patient, obtaining history from patient or surrogate, ordering and performing treatments and interventions, ordering and review of laboratory studies, ordering and review of radiographic studies, pulse oximetry and re-evaluation of patient's condition.

## 2022-05-01 NOTE — Procedures (Signed)
Central Venous Catheter Insertion Procedure Note  ANNALEAH ARATA  [id number]  05/13/1971  Date:05/01/22  Time:11:55 AM   Provider Performing:Kristel Durkee Humphrey Rolls   Procedure: Insertion of Non-tunneled Central Venous Catheter(36556)with US guidance (76226)    Indication(s) Hemodialysis  Consent Risks of the procedure as well as the alternatives and risks of each were explained to the patient and/or caregiver.  Consent for the procedure was obtained and is signed in the bedside chart  Anesthesia Topical only with 1% lidocaine   Timeout Verified patient identification, verified procedure, site/side was marked, verified correct patient position, special equipment/implants available, medications/allergies/relevant history reviewed, required imaging and test results available.  Sterile Technique Maximal sterile technique including full sterile barrier drape, hand hygiene, sterile gown, sterile gloves, mask, hair covering, sterile ultrasound probe cover (if used).  Procedure Description Area of catheter insertion was cleaned with chlorhexidine and draped in sterile fashion.   With real-time ultrasound guidance a HD catheter was placed into the right internal jugular vein.  Nonpulsatile blood flow and easy flushing noted in all ports.  The catheter was sutured in place and sterile dressing applied.  Complications/Tolerance None; patient tolerated the procedure well. Chest X-ray is ordered to verify placement for internal jugular or subclavian cannulation.  Chest x-ray is not ordered for femoral cannulation.  EBL Minimal  Specimen(s) None

## 2022-05-01 NOTE — Progress Notes (Signed)
Cardiology at bedside. Paged CCM ground team to bedside per cardiology.  Decision was made to take the patient to the cath lab.   CRRT stopped, blood returned and HD line heparin locked per protocol.  Pt given ordered IV Benadryl and Hydrocortisone per orders due to contrast allergy.  Pt transported to Cath lab with bedside RN x2, RT, and cath lab team.  Report given to Erma Heritage, RN as pt going to [facility] after cath lab.

## 2022-05-01 NOTE — Consult Note (Addendum)
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  Earlier portable chest today at 3:39 a.m. FINDINGS: 5:30 a.m. ETT tip is 2.9 cm from the carina. NGT enters the stomach but the tip is only a few cm in the stomach and should be advanced further in. Right PICC terminates in the distal SVC with left IJ line terminating in the upper right atrium. There is overlying monitor wiring. There are linear atelectatic bands in the left lung base as well as streaky infrahilar opacity which could be additional atelectasis or pneumonia. There is right mid perihilar linear atelectasis. Remaining lungs remain clear. There is cardiomegaly without evidence of CHF. Stable mediastinum with aortic tortuosity and ectasia. No acute osseous abnormality. IMPRESSION: Atelectasis or infiltrate in the left infrahilar area with linear atelectasis in the left base, right mid field. Overall aeration seems unchanged. NGT proximal  side hole remains in the distal esophagus and should be advanced further in. Stable cardiomegaly. Electronically Signed   By: Telford Nab M.D.   On: 04/30/2022 07:29   CT CHEST ABDOMEN PELVIS WO CONTRAST  Result Date: 04/30/2022 CLINICAL DATA:  Bowel obstruction suspected. Worsening shock with lactate acidosis and bowel obstruction. EXAM: CT CHEST, ABDOMEN AND PELVIS WITHOUT CONTRAST TECHNIQUE: Multidetector CT imaging of the chest, abdomen and pelvis was performed following the standard protocol without IV contrast. RADIATION DOSE REDUCTION: This exam was performed according to the departmental dose-optimization program which includes automated exposure control, adjustment of the mA and/or kV according to patient size and/or use of iterative reconstruction technique. COMPARISON:  Abdominal CT from yesterday FINDINGS: CT CHEST FINDINGS Cardiovascular: Normal heart size. No pericardial effusion. Small volume gas in the right ventricle usually from IV access. Left IJ and right PICC with tips in expected position at the upper cavoatrial junction. Mediastinum/Nodes: Fluid dilated esophagus containing contrast. No adenopathy. Lungs/Pleura: Endotracheal tube in good position with tip above the carina. Worsening atelectasis at the lung bases. Patchy airspace opacity in the right middle lobe. Musculoskeletal: No acute finding. Scoliosis. Chronic left medial clavicle and first rib deformities. CT ABDOMEN PELVIS FINDINGS Hepatobiliary: Hepatic steatosis.No evidence of biliary obstruction or stone. Pancreas: Unremarkable. Spleen: Unremarkable. Adrenals/Urinary Tract: Negative adrenals. No hydronephrosis or stone. Unremarkable bladder. Stomach/Bowel: Dilated and fluid-filled small bowel with transition point in the distal bowel at the level of the pelvis where there is distortion of bowel loop suggesting scarring. Entero-enteric anastomosis is seen in the right lower quadrant. Mild mesenteric edema associated with the  obstructed segments. Vascular/Lymphatic: No acute vascular abnormality. No mass or adenopathy. Reproductive:Hysterectomy Other: Ventral abdominal wall scarring with low-density component attributed to recent hernia repair. Anasarca. Few bubbles of subcutaneous gas presumably from recent surgery. No visible hernia. Musculoskeletal: No acute abnormalities. IMPRESSION: 1. Continued small bowel obstruction with transition in the pelvis where there is scarring. 2. Worsening atelectasis at the lung bases. Right middle lobe aspiration pneumonitis/pneumonia. The esophagus is diffusely fluid-filled. Electronically Signed   By: Jorje Guild M.D.   On: 04/30/2022 06:52   DG CHEST PORT 1 VIEW  Result Date: 04/30/2022 CLINICAL DATA:  Check central line placement EXAM: PORTABLE CHEST 1 VIEW COMPARISON:  04/30/2022 FINDINGS: Endotracheal tube and gastric catheter are noted. Gastric catheter should be advanced as the proximal side port lies in the distal esophagus. Left jugular central line is now seen in the mid superior vena cava. No pneumothorax is noted. Right PICC is noted in satisfactory position. Lungs are well aerated. Left basilar atelectasis is noted similar to that seen on the prior exam. IMPRESSION: No pneumothorax following central line placement on the left. Gastric catheter should be advanced distally into the stomach. Stable left basilar atelectasis. Electronically Signed   By: Inez Catalina M.D.   On: 04/30/2022 03:50   Portable Chest x-ray  Result Date: 04/30/2022 CLINICAL DATA:  [id number].  Endotracheal tube placement film. EXAM: PORTABLE CHEST 1 VIEW COMPARISON:  Portable chest yesterday at 2:22 p.m. FINDINGS: 2:48 a.m. ETT is in place now with tip 2 cm from the carina. Right PICC tip at the superior cavoatrial junction. NGT is in place, again with the proximal side hole in the distal esophagus and should be advanced into the stomach for continued use. The cardiac size is stable.  Central vessels are  normal caliber. The mediastinal configuration is normal.  The lungs are expiratory. There is increased linear atelectasis in both bases and  streaky increased left infrahilar lower lobe opacity which could be atelectasis, new infiltrate or aspiration. The upper lung fields are entirely clear. No pleural effusion is seen. Multiple overlying monitor wires. IMPRESSION: Support devices as above.  ETT tip is 2 cm from the carina. NGT proximal side hole in the distal esophagus and should be advanced into the stomach for continued use. Expiratory exam with increased linear basilar atelectatic foci and increased streaky left infrahilar lower lobe opacity which could be atelectasis, pneumonia or aspiration. Stable mild cardiomegaly. Electronically Signed   By: Telford Nab M.D.   On: 04/30/2022 02:53   VAS [place] LOWER EXTREMITY VENOUS (DVT)  Result Date: 04/29/2022  Lower Venous DVT Study Patient Name:  CASSONDRA STACHOWSKI [facility]  Date of Exam:   04/29/2022 Medical Rec #: [id number]           Accession #:    [id number] Date of Birth: 04-May-1971           Patient Gender: F Patient Age:   25 years Exam Location:  [facility] Procedure:      VAS [place] LOWER EXTREMITY VENOUS (DVT) Referring Phys: MICHAEL MACZIS --------------------------------------------------------------------------------  Indications: SOB, and Edema.  Comparison Study: No prior study Performing Technologist: Sharion Dove RVS  Examination Guidelines: A complete evaluation includes B-mode imaging, spectral Doppler, color Doppler, and power Doppler as needed of all accessible portions of each vessel. Bilateral testing is considered an integral part of a complete examination. Limited examinations for reoccurring indications may be performed as noted. The reflux portion of the exam is performed with the patient in reverse Trendelenburg.  +---------+---------------+---------+-----------+----------+--------------+ RIGHT     CompressibilityPhasicitySpontaneityPropertiesThrombus Aging +---------+---------------+---------+-----------+----------+--------------+ CFV      Full           Yes      Yes                                 +---------+---------------+---------+-----------+----------+--------------+ SFJ      Full                                                        +---------+---------------+---------+-----------+----------+--------------+ FV Prox  Full                                                        +---------+---------------+---------+-----------+----------+--------------+ FV Mid   Full                                                        +---------+---------------+---------+-----------+----------+--------------+ FV DistalFull                                                        +---------+---------------+---------+-----------+----------+--------------+ PFV      Full                                                        +---------+---------------+---------+-----------+----------+--------------+  POP      Full           Yes      Yes                                 +---------+---------------+---------+-----------+----------+--------------+ PTV      Full                                                        +---------+---------------+---------+-----------+----------+--------------+ PERO     Full                                                        +---------+---------------+---------+-----------+----------+--------------+   +---------+---------------+---------+-----------+----------+--------------+ LEFT     CompressibilityPhasicitySpontaneityPropertiesThrombus Aging +---------+---------------+---------+-----------+----------+--------------+ CFV      Full           Yes      Yes                                 +---------+---------------+---------+-----------+----------+--------------+ SFJ      Full                                                         +---------+---------------+---------+-----------+----------+--------------+ FV Prox  Full                                                        +---------+---------------+---------+-----------+----------+--------------+ FV Mid   Full                                                        +---------+---------------+---------+-----------+----------+--------------+ FV DistalFull                                                        +---------+---------------+---------+-----------+----------+--------------+ PFV      Full                                                        +---------+---------------+---------+-----------+----------+--------------+ POP      Full           Yes      Yes                                 +---------+---------------+---------+-----------+----------+--------------+  PTV      Full                                                        +---------+---------------+---------+-----------+----------+--------------+ PERO     Full                                                        +---------+---------------+---------+-----------+----------+--------------+     Summary: BILATERAL: - No evidence of deep vein thrombosis seen in the lower extremities, bilaterally. -No evidence of popliteal cyst, bilaterally.   *See table(s) above for measurements and observations. Electronically signed by Servando Snare MD on 04/29/2022 at 7:51:28 PM.    Final    DG CHEST PORT 1 VIEW  Result Date: 04/29/2022 CLINICAL DATA:  Status post PICC central line placement. EXAM: PORTABLE CHEST 1 VIEW COMPARISON:  Same day chest radiograph at 1131 hours; same-day CT abdomen pelvis at 0748 hours FINDINGS: Interval repositioning of the right upper extremity PICC with the tip now projecting at the level of the superior cavoatrial junction. Nasogastric tube tip is below the diaphragm with the side hole projecting at the level of the distal esophagus. Bibasilar  subsegmental atelectasis as seen on same day CT abdomen pelvis. No new focal consolidation. No pleural effusion or pneumothorax. Heart is normal in size. IMPRESSION: Interval repositioning of the right upper extremity PICC with the tip now projecting at the level of the superior cavoatrial junction. Bibasilar subsegmental atelectasis, similar to earlier exam. Electronically Signed   By: Ileana Roup M.D.   On: 04/29/2022 14:41   DG Chest Port 1 View  Result Date: 04/29/2022 CLINICAL DATA:  PICC line placement EXAM: PORTABLE CHEST 1 VIEW COMPARISON:  April 10, 2022 FINDINGS: A right PICC line has been placed in the interval. A slight ir in the distal PICC line suggests the PICC line may extend slightly into the azygous vein. An NG tube terminates in the stomach. Infiltrate is identified in the left base. No other interval changes or acute abnormalities. IMPRESSION: 1. A right PICC line has been placed in the interval. The distal PICC line may extend slightly into the azygous vein. Consider repositioning. 2. Left basilar infiltrate. Electronically Signed   By: Dorise Bullion III M.D.   On: 04/29/2022 11:48   [place] EKG SITE RITE  Result Date: 04/29/2022 If Site Rite image not attached, placement could not be confirmed due to current cardiac rhythm.  CT ABDOMEN PELVIS WO CONTRAST  Result Date: 04/29/2022 CLINICAL DATA:  51 year old female with suspected bowel obstruction. EXAM: CT ABDOMEN AND PELVIS WITHOUT CONTRAST TECHNIQUE: Multidetector CT imaging of the abdomen and pelvis was performed following the standard protocol without IV contrast. RADIATION DOSE REDUCTION: This exam was performed according to the departmental dose-optimization program which includes automated exposure control, adjustment of the mA and/or kV according to patient size and/or use of iterative reconstruction technique. COMPARISON:  CT of the abdomen and pelvis 02/07/2022. FINDINGS: Lower chest: Opacities in the dependent portions  of the lower lobes of the lungs bilaterally, may reflect atelectasis and/or developing scarring. Nasogastric tube extending into the stomach. Hepatobiliary: Diffuse low attenuation throughout the hepatic parenchyma, indicative of  a background of hepatic steatosis. No definite suspicious cystic or solid hepatic lesions are confidently identified on today's noncontrast CT examination. Unenhanced appearance of the gallbladder is normal. Pancreas: No definite pancreatic mass or peripancreatic fluid collections or inflammatory changes are noted on today's noncontrast CT examination. Spleen: Unremarkable. Adrenals/Urinary Tract: Unenhanced appearance of the kidneys, bilateral adrenal glands and urinary bladder is normal. No hydroureteronephrosis. Stomach/Bowel: Nasogastric tube extending into the mid body of the stomach. Numerous dilated loops of small bowel measuring up to 4.6 cm in diameter, with multiple air-fluid levels. There is a suture line in the anterior aspect of the lower abdomen, presumably from prior partial small bowel resection. An exact transition point is not confidently identified, however, the distal ileum appears completely decompressed, suggesting a transition point in the distal small bowel. Appendix is not confidently identified may be surgically absent. Small amount of gas and stool noted in the colon. Vascular/Lymphatic: Atherosclerotic calcifications in the abdominal aorta. No definite lymphadenopathy noted in the abdomen or pelvis. Reproductive: Status post total abdominal hysterectomy and bilateral salpingo-oophorectomy. Other: No significant volume of ascites.  No pneumoperitoneum. Musculoskeletal: Soft tissue stranding in the subcutaneous fat of the right anterior abdominal wall. There are no aggressive appearing lytic or blastic lesions noted in the visualized portions of the skeleton. IMPRESSION: 1. Findings are concerning for small bowel obstruction, likely with transition point in the  distal small bowel, as above. Surgical consultation is recommended. 2. Interval development of dependent opacities in the lower lobes of the lungs bilaterally, which may reflect areas of atelectasis and/or developing scarring. 3. Hepatic steatosis. 4. Aortic atherosclerosis. 5. Additional incidental findings, as above. These results will be called to the ordering clinician or representative by the Radiologist Assistant, and communication documented in the PACS or [employer]. Electronically Signed   By: Vinnie Langton M.D.   On: 04/29/2022 08:01   DG Abd 1 View  Result Date: 04/29/2022 CLINICAL DATA:  Check gastric catheter placement EXAM: ABDOMEN - 1 VIEW COMPARISON:  None Available. FINDINGS: Gastric catheter is noted within the stomach. Persistent small bowel dilatation is noted. IMPRESSION: Gastric catheter within the stomach. Electronically Signed   By: Inez Catalina M.D.   On: 04/29/2022 03:09   DG Abd 1 View  Result Date: 04/29/2022 CLINICAL DATA:  NG tube placement. EXAM: ABDOMEN - 1 VIEW COMPARISON:  04/28/2022. FINDINGS: There is a dilated loop of small bowel in the upper abdomen measuring up to 4.0 cm. An NG tube terminates in the stomach at the gastric fundus. No radio-opaque calculi or other significant radiographic abnormality are seen. Mild atelectasis is present at the left lung base. IMPRESSION: 1. Dilated loop of small bowel in the upper abdomen measuring up to 4 cm, possible ileus versus partial or early obstruction. 2. Enteric tube terminates in the stomach. Electronically Signed   By: Brett Fairy M.D.   On: 04/29/2022 01:55   DG Abd Portable 1V  Result Date: 04/28/2022 CLINICAL DATA:  [id number] in G-tube placement EXAM: PORTABLE ABDOMEN - 1 VIEW COMPARISON:  September 13th 2023 radiograph FINDINGS: Magdalene Molly is not visualized, limiting assessment. Enteric tube tip terminates over the expected region of the proximal stomach. There are dilated loops of small bowel within the upper  abdomen. Bibasilar atelectasis. Incomplete visualization of the pelvis. IMPRESSION: 1. Enteric tube terminates over the expected region of the proximal stomach. 2. Diffuse gaseous dilation of multiple loops of small bowel. Differential considerations include small-bowel obstruction or ileus. Consider further evaluation with CT abdomen pelvis  with contrast if concern for obstruction. Electronically Signed   By: Valentino Saxon M.D.   On: 04/28/2022 17:15     Assessment and Plan:   STEMI on EKG pt intubated and unknown pain. She is sedated.  With hypotension, on norepinephrine though decreasing dose.AKI new this admit. Infection with elevated procalcitionin >150.  Dr Margaretann Loveless and Dr Ali Lowe evaluating pt   rule out pericarditis. 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  Septic shock due to intra-abdominal infection vs. Aspiration PNA, s/p hernia repair c/b ileus per CCM and surgery Acute respiratory failure, hypoxia on vent per CCM AKI with high anion gap metabolic acidosis.  Beginning CRRT per nephrology DM-2 per CCM Anemia/chronic low back pain/BIpolar disorder/ HTN per CCM     Risk Assessment/Risk Scores:     TIMI Risk Score for ST  Elevation MI:   The patient's TIMI risk score is  , which indicates a  % risk of all cause mortality at 30 days.           For questions or updates, please contact [place] [facility] Please consult [email or web address] for contact info under    Signed, Cecilie Kicks, NP  05/01/2022 7:01 PM  ---------------------------------------------------------------------------------------------   History and all data above reviewed.  Patient examined.  I agree with the findings as above.  SHANESSA HODAK is a 51 yo female with no known cardiovascular history on whom we were consulted for STEMI.  Patient underwent hernia repair on admission and subsequently had ileus with vomiting and likely aspiration pneumonia, with subsequent septic shock, significant  pressor requirement, and lactic acidosis.  Abdominal imaging was pursued without IV contrast given contrast allergy, no definite findings of ischemic or necrotic bowel, small bowel obstruction noted at a point of scarring.  Right middle lobe aspiration pneumonitis/pneumonia noted.  Patient had worsening renal function and continued acidosis and was oliguric today prompting initiation of CRRT, per bedside nurse this initiated at 1 PM.  At approximately 3 PM on telemetry review ST elevations were noted, these were pursued approximately 4 PM, EKG performed concerning for ST elevation MI.  Critical care medicine notified.  We were contacted for notification of STEMI at [place].  ST elevations are seen inferiorly and laterally.  I presented urgently to the bedside and discussed patient's care with bedside nursing team both day and evening shift were present.  Given concern of new EKG changes, I performed a bedside echocardiogram.  LV ejection fraction 15% with septal regional wall motion abnormality.  At least moderate RV dysfunction.  Valve function was unable to be assessed on limited echocardiogram, mitral and aortic valves appear grossly normal.  Trivial pericardial fluid.  I compared these images to the images of yesterday's echocardiogram.  LVEF by my assessment was approximately 20 to 25% with tachycardia, similar appearing regionals were present though in the setting of global severe hypokinesis and were subtle.  I reviewed her CT chest abdomen pelvis, no coronary calcifications.  I contacted interventional cardiology, who also urgently presented to the bedside for patient evaluation.  Constitutional: intubated and sedated ENMT: ETT and OG tube in place. Cardiovascular: regular rhythm, normal rate, no murmurs. S1 and S2 normal. Radial pulses 1/4 bilat.  Respiratory: clear on vent GI : firm abdomen MSK: extremities cool.  NEURO/PSYCH: intubated and sedated, unable to assess. Opens eyes.   All  available labs, radiology testing, previous records reviewed. Agree with documented assessment and plan of my colleague as stated above with the following  additions or changes:  Principal Problem:   Septic shock (HCC) Active Problems:   Diabetes mellitus type 2 in obese ([place])   BIPOLAR DISORDER UNSPECIFIED   Morbid obesity (HCC)   Chronic low back pain   Hernia of abdominal cavity   Aspiration pneumonia (HCC)   Prolonged QT interval   Essential hypertension    Plan: Discussion with CCM PA, pharmacy, bedside nursing, interventional cardiology performed.  Decision was made given patient's young age and seemingly newly reduced ejection fraction with severely elevated troponin and ST elevations to pursue cardiac catheterization emergently.  Dr. Ali Lowe, interventional cardiology contacted patient's sister who is her medical POA and requested consent for cardiac catheterization, family agreed.  Risks and benefits discussed in detail with family.  -Patient has a contrast allergy, will premedicate -Therapeutic IV heparin initiated prior to transfer to the Cath Lab -Extended use of [id number] inhibitor will likely be necessary given patient's ileus and concern if intervention performed. -Discussed with critical care medicine, CRRT will need to be paused for procedure.   Length of Stay:  LOS: 7 days   Elouise Munroe, MD [facility] 8:10 PM  05/01/2022   CRITICAL CARE Performed by: Cherlynn Kaiser, MD   Total critical care time: 60 minutes   Critical care time was exclusive of separately billable procedures and treating other patients.   Critical care was necessary to treat or prevent imminent or life-threatening deterioration.   Critical care was time spent personally by me (independent of APPs or residents) on the following activities: development of treatment plan with patient and/or surrogate as well as nursing, discussions with consultants, evaluation of patient's response to  treatment, examination of patient, obtaining history from patient or surrogate, ordering and performing treatments and interventions, ordering and review of laboratory studies, ordering and review of radiographic studies, pulse oximetry and re-evaluation of patient's condition.

## 2022-05-01 NOTE — Progress Notes (Signed)
Pt transported to CATH lab on 100% O2 w/vent settings as per flowsheet. Pt tol well and set up in lab w/out incidence.

## 2022-05-01 NOTE — Progress Notes (Signed)
St changes noted on bedside cardiac monitor. 12 lead EKG obtained stating "STEMI". Dr Shearon Stalls notified. HS Troponin added to 4pm labs. Will continue to monitor

## 2022-05-01 NOTE — Progress Notes (Signed)
Notified about ST segment changes. I reviewed the EKG changes which show ST segment elevation in the inferolateral leads with reciprocal anterior changes. Troponin >24,000. Hemodynamically she has actually improved since starting CRRT with likely correction of her acidosis. Did have some HFrEF on echo yesterday which was previously attributed to septic cardiomyopathy. Will start heparin gtt and consult cardiology for STEMI.  Additional cc time 34 minutes.   Lenice Llamas, MD [facility] [place] 05/01/2022 6:52 PM Pager: see AMION  If no response to pager, please call critical care on call (see AMION) until 7pm After 7:00 pm call Elink

## 2022-05-01 NOTE — Progress Notes (Signed)
Nutrition Follow-up  DOCUMENTATION CODES:   Not applicable  INTERVENTION:   - TPN management per Pharmacy to meet 100% of estimated needs  Recommend Cortrak placement on 10/05 and initiation of enteral nutrition. Recommend: - Start Vital 1.5 @ 20 ml/hr and advance by 10 ml q 8 hours to goal rate of 50 ml/hr (1200 ml/day) - PROSource TF20 60 ml BID  Recommended tube feeding regimen at goal rate would provide 1960 kcal, 121 grams of protein, and 917 ml of H2O.   NUTRITION DIAGNOSIS:   Inadequate oral intake related to inability to eat as evidenced by NPO status (SBO).  Ongoing, being addressed via TPN  GOAL:   Patient will meet greater than or equal to 90% of their needs  Met via TPN at goal rate  MONITOR:   Vent status, Labs, Weight trends, Skin, I & O's, Other (TPN)  REASON FOR ASSESSMENT:   Consult New TPN/TNA  ASSESSMENT:   Pt with hx of bipolar disorder, HTN, HLD, GERD, chronic bronchitis, GERD, DM type 2, and multiple prior abdominal surgeries presented to hospital for planned repair of a spigelian hernia.  09/26 - s/p robotic incisional hernia repair with mesh, extensive LOA 10/02 - transferred to ICU, pressors started, NG tube placed for decompression, TPN start 10/03 - intubated, NG tube exchanged for OG tube 10/04 - CRRT start, OG tube removed   Discussed pt with RN and during ICU rounds. CRRT to start today with primary goal for improving acid/base balance and clearance of azotemia. Plan to begin ultrafiltration as hemodynamic status improves.  Pt having bowel movements and has had 3 unmeasured occurrences over the last 24 hours. Per Surgery note, postoperative bowel obstruction now seems resolved. Noted pt with Cortrak order in place for tomorrow, 10/05. Recommend initiation of enteral nutrition after Cortrak is placed. RD to leave tube feeding recommendations.  Discussed pt with TPN Pharmacist. TPN to be concentrated today and increased to goal rate of 65  ml/hr which will provide 2050 kcal and 120 grams of protein. Lipids to be added back as propofol has been titrated off.  No new weights since admission so unable to evaluate weight trends. Will order daily weights.  Patient remains intubated on ventilator support MV: 13.4 L/min Temp (24hrs), Avg:104 F (40 C), Min:98.4 F (36.9 C), Max:105.9 F (41.1 C)  Drips: Fentanyl Versed Levophed Vasopressin Neosynephrine: off this AM Sodium bicarb 75 ml/hr TPN: to increase to 65 ml/hr today at 1800  Medications reviewed and include: colace, IV solu-cortef, SSI q 4 hours, IV protonix, miralax, IV abx  Labs reviewed: sodium 133, potassium 3.4, BUN 71, creatinine 6.13, phosphorus 6.3, ionized calcium 0.78, elevated LFTs, lactic acid 4.7, platelets 129 CBG's: 118-164 x 24 hours  UOP: 554 ml x 24 hours OGT: 200 ml x 24 hours I/O's: +17.7 L since admit  Diet Order:   Diet Order             Diet NPO time specified  Diet effective now                   EDUCATION NEEDS:   No education needs have been identified at this time  Skin:  Skin Assessment: Reviewed RN Assessment (surgical incisions - abdomen)  Last BM:  05/01/22 multiple type 7  Height:   Ht Readings from Last 1 Encounters:  04/30/22 _0  (1.626 m)    Weight:   Wt Readings from Last 1 Encounters:  04/23/22 109.3 kg    Ideal Body  Weight:  54.5 kg  BMI:  Body mass index is 41.37 kg/m.  Estimated Nutritional Needs:   Kcal:  1800-2000 kcal/d  Protein:  110-130 grams  Fluid:  1.8-2L/d    Gustavus Bryant, MS, RD, LDN Inpatient Clinical Dietitian Please see AMiON for contact information.

## 2022-05-01 NOTE — Progress Notes (Signed)
Progress Note: General Surgery Service   Chief Complaint/Subjective:  Having bowel movements.  Temperature better.  Kidney function worse.  Pressor requirement variable overnight, back to similar pressor support as yesterday.  Objective: Vital signs in last 24 hours: Temp:  [99 F (37.2 C)-105.9 F (41.1 C)] 101.8 F (38.8 C) (10/04 0327) Pulse Rate:  [105-145] 105 (10/04 0645) Resp:  [13-34] 13 (10/04 0645) BP: (65-99)/(17-63) 95/60 (10/03 1507) SpO2:  [61 %-100 %] 100 % (10/04 0645) Arterial Line BP: (83-175)/(54-92) 129/87 (10/04 0645) FiO2 (%):  [60 %-100 %] 80 % (10/04 0400) Last BM Date : 04/28/22  Intake/Output from previous day: 10/03 0701 - 10/04 0700 In: 10262.5 [I.V.:7048; IV Piggyback:3214.5] Out: 754 [Urine:554; Emesis/NG output:200] Intake/Output this shift: Total I/O In: 2985.4 [I.V.:2572.8; IV Piggyback:412.7] Out: 129 [Urine:29; Emesis/NG output:100]   GEN: intubated, sedated GI: Abd difficult exam due to body habitus, liquid stool filling bed  Lab Results: CBC  Recent Labs    04/30/22 0037 04/30/22 0144 04/30/22 2108 05/01/22 0416  WBC 7.7  --   --  10.0  HGB 11.3*   < > 10.2* 11.4*  HCT 35.6*   < > 30.0* 34.4*  PLT 437*  --   --  129*   < > = values in this interval not displayed.    BMET Recent Labs    04/30/22 1301 04/30/22 2108 05/01/22 0416  NA 139 138 133*  K 4.7 3.6 3.4*  CL 102  --  97*  CO2 18*  --  15*  GLUCOSE 189*  --  190*  BUN 54*  --  71*  CREATININE 4.70*  --  6.01*  CALCIUM 6.0*  --  6.6*    PT/INR No results for input(s): "LABPROT", "INR" in the last 72 hours. ABG Recent Labs    04/30/22 1023 04/30/22 2108  PHART 7.372 7.285*  HCO3 17.9* 14.7*     Anti-infectives: Anti-infectives (From admission, onward)    Start     Dose/Rate Route Frequency Ordered Stop   04/30/22 1400  piperacillin-tazobactam (ZOSYN) IVPB 2.25 g        2.25 g 100 mL/hr over 30 Minutes Intravenous Every 8 hours 04/30/22 1325      04/29/22 1530  piperacillin-tazobactam (ZOSYN) IVPB 3.375 g  Status:  Discontinued        3.375 g 12.5 mL/hr over 240 Minutes Intravenous Every 8 hours 04/29/22 1433 04/30/22 1325   04/29/22 1145  metroNIDAZOLE (FLAGYL) IVPB 500 mg  Status:  Discontinued        500 mg 100 mL/hr over 60 Minutes Intravenous Every 12 hours 04/29/22 1047 04/29/22 1433   04/29/22 1145  vancomycin (VANCOCIN) IVPB 1000 mg/200 mL premix  Status:  Discontinued        1,000 mg 200 mL/hr over 60 Minutes Intravenous  Once 04/29/22 1047 04/29/22 1055   04/29/22 1145  vancomycin (VANCOREADY) IVPB 1500 mg/300 mL  Status:  Discontinued        1,500 mg 150 mL/hr over 120 Minutes Intravenous Every 24 hours 04/29/22 1055 04/29/22 1357   04/29/22 1045  ceFEPIme (MAXIPIME) 2 g in sodium chloride 0.9 % 100 mL IVPB  Status:  Discontinued        2 g 200 mL/hr over 30 Minutes Intravenous Every 8 hours 04/29/22 0954 04/29/22 1433   04/23/22 0730  ceFAZolin (ANCEF) IVPB 2g/100 mL premix        2 g 200 mL/hr over 30 Minutes Intravenous On call to O.R. 04/23/22 4098 04/23/22  0828       Medications: Scheduled Meds:  Chlorhexidine Gluconate Cloth  6 each Topical Daily   docusate  100 mg Per Tube BID   heparin injection (subcutaneous)  5,000 Units Subcutaneous Q8H   hydrocortisone sod succinate (SOLU-CORTEF) inj  100 mg Intravenous Q12H   insulin aspart  0-9 Units Subcutaneous TID WC   mouth rinse  15 mL Mouth Rinse Q2H   pantoprazole (PROTONIX) IV  40 mg Intravenous Q24H   polyethylene glycol  17 g Per Tube Daily   sodium chloride flush  10-40 mL Intracatheter Q12H   topiramate  25 mg Oral QHS   traZODone  100 mg Oral QHS   Continuous Infusions:  sodium chloride 10 mL/hr at 05/01/22 0600   acetaminophen Stopped (04/30/22 1506)   acetaminophen 1,000 mg (05/01/22 0659)   fentaNYL infusion INTRAVENOUS 150 mcg/hr (05/01/22 0600)   methocarbamol (ROBAXIN) IV Stopped (04/29/22 1354)   midazolam 3.5 mg/hr (05/01/22 0600)    norepinephrine (LEVOPHED) Adult infusion 24 mcg/min (05/01/22 0600)   phenylephrine (NEO-SYNEPHRINE) Adult infusion Stopped (05/01/22 0500)   piperacillin-tazobactam (ZOSYN)  IV Stopped (05/01/22 0542)   propofol (DIPRIVAN) infusion Stopped (04/30/22 2320)   sodium bicarbonate 150 mEq in sterile water 1,150 mL infusion 75 mL/hr at 05/01/22 0600   TPN ADULT (ION) 45 mL/hr at 05/01/22 0600   vasopressin 0.04 Units/min (05/01/22 0600)   PRN Meds:.acetaminophen, alum & mag hydroxide-simeth, diphenhydrAMINE, fentaNYL, fentaNYL (SUBLIMAZE) injection, fentaNYL (SUBLIMAZE) injection, HYDROmorphone (DILAUDID) injection, hydrOXYzine, methocarbamol (ROBAXIN) IV, mouth rinse, prochlorperazine, simethicone, sodium chloride flush  Assessment/Plan: Patricia [name] underwent difficult robotic incisional hernia repair with mesh and long lysis of adhesions on 04/23/22  Postoperative bowel obstruction on imaging, now seems resolved - having bowel movements Aspiration event 10/1 - threw up despite NG tube, lead to septic shock, lactic acidosis, transfer to the ICU, intubation, severe AKI, high pressor requirement.  Appreciate critical care team's assistance in care.  Nephrology consulted, may need renal replacement therapy.   I would not start enteral feeding until off, or nearly off pressors   I am post-call today.  I discussed the case with Dr. Donne Hazel at signout who is running the emergency doctor of the week service this week.  If there are questions for the general surgery team please contact the PA pager for the emergency general surgery service, or the appropriate surgeon on call on amion.    LOS: 7 days    Felicie Morn, MD  [facility] Use [email or web address] to contact on call provider  Daily Billing: [phone] - post op

## 2022-05-01 NOTE — Progress Notes (Addendum)
PHARMACY - TOTAL PARENTERAL NUTRITION CONSULT NOTE   Indication: Small bowel obstruction  Patient Measurements: Height: '5\' 4"'$  (162.6 cm) Weight: 109.3 kg (241 lb) IBW/kg (Calculated) : 54.7 TPN AdjBW (KG): 68.4 Body mass index is 41.37 kg/m.   Assessment: 51 yo female s/p difficult incisional hernia repair with long lysis of adhesion on 9/26. Who has developed a SBO based on CT scan 10/2.  Poor po intake since surgery  due to abdominal pain.  Pharmacy consulted to start TPN.  Patient intubated on 10/3 due to respiratory distress. Bowel function overnight, plans to hopefully start trickle feeds in the next 24 hours and potentially wean TPN off. Surgery recs holding tube feeds until pressor requirement decreased.   Glucose / Insulin: BG mostly controlled <180, 2x occurrence over goal, 6 units of SSI over past 24 hours  Electrolytes: K: 3.4 (low, on bicarb gtt), Corrected calcium: 8.0 (low, s/p 3g calcium gluconate replacement), Phos: 6.3, Mag: 2.0, Na: 133 (low), chloride: 97 (low), HCO: 15 while on bicarb gtt  Renal: Scr: 6.01 increased from 0.94, CRRT starting 10/4  Hepatic: T bili: 1.5, AST/ALT: 84/48  Intake / Output; MIVF: -554 urine output, -1044m NG output, 3x stool occurrence   GI Imaging: 10/3 CT ab: continued SBO  10/2 CT Scan - SBO GI Surgeries / Procedures: Not since TPN initiated  Central access: CVC triple lumen  TPN start date: 10/2  Nutritional Goals: Goal TPN rate is 65 mL/hr (provides 120 g of protein and 2050 kcals per day)  RD Assessment:  Estimated Needs Total Energy Estimated Needs: 1800-2000 kcal/d Total Protein Estimated Needs: 110-130 grams Total Fluid Estimated Needs: 1.8-2L/d  Current Nutrition:  NPO TPN   Plan:  Will increase TPN up to goal and concentrate given CRRT starting on 10/4, rate 621mhr provides 120g protein and 2050kcal, meeting 100% of patient's nutritional needs.  Added back lipids as propofol has been titrated off.  Electrolytes  in TPN: Na 37m73mL, K 37mE24m, Ca 3mEq38m Mg 37mEq/51mand Phos 37mmol/55mMax acetate given refractory metabolic acidosis, expect to resolve as CRRT starts. Consider moving to 2:1 on 10/5 given chloride is low. Consider addition of Na and low dose K as patient starts CRRT and stops bicarbonate drip.  Will reach out to nephrology for recommendations for potassium repletion.  Calcium repleted this morning.  Add standard MVI and trace elements to TPN, chromium removed for CRRT  Increase to moderate SSI as TPN is titrated to goal.  Monitor TPN labs on Mon/Thurs, and daily until stabilized  HeatherEsmeralda ArthurD  Clinical Pharmacist Please see AMION for all Pharmacists' Contact Phone Numbers 05/01/2022, 9:24 AM

## 2022-05-01 NOTE — Progress Notes (Signed)
PHARMACY NOTE:  ANTIMICROBIAL RENAL DOSAGE ADJUSTMENT  Current antimicrobial regimen includes a mismatch between antimicrobial dosage and estimated renal function.  As per policy approved by the Pharmacy & Therapeutics and Medical Executive Committees, the antimicrobial dosage will be adjusted accordingly.  Current antimicrobial dosage:  Zosyn 2.25g Q8H    Indication: Intra-abdominal infection   Renal Function:  Estimated Creatinine Clearance: 13.2 mL/min (A) (by C-G formula based on SCr of 6.01 mg/dL (H)). '[]'$      On intermittent HD, scheduled: '[x]'$      On CRRT, started on 10/4    Antimicrobial dosage has been changed to:  Zosyn 3.375 (over 30 mins) Q6H  Thank you for allowing pharmacy to be a part of this patient's care.  Ventura Sellers, [facility] 05/01/2022 2:29 PM

## 2022-05-01 NOTE — Progress Notes (Signed)
ANTICOAGULATION CONSULT NOTE - Pharmacy Consult for Heparin Indication: chest pain/ACS  Allergies  Allergen Reactions   Iodinated Contrast Media Shortness Of Breath, Other (See Comments) and Cough    Throat itching    Peanut-Containing Drug Products Anaphylaxis    Peanut Oil Only.  Can eat peanuts with no problems   Oxycodone-Acetaminophen Itching    Patient Measurements: Height: '5\' 4"'$  (162.6 cm) Weight: 109.3 kg (241 lb) IBW/kg (Calculated) : 54.7 Heparin Dosing Weight: 82  Vital Signs: Temp: 94.4 F (34.7 C) (10/04 1600) Temp Source: Esophageal (10/04 1600) Pulse Rate: 89 (10/04 1615)  Labs: Recent Labs    04/29/22 0031 04/29/22 0926 04/29/22 1516 04/29/22 1642 04/30/22 0037 04/30/22 0055 04/30/22 1023 04/30/22 1301 04/30/22 2108 05/01/22 0416 05/01/22 1600  HGB 12.2  --   --    < > 11.3*   < > 10.9*  --  10.2* 11.4*  --   HCT 39.2  --   --    < > 35.6*   < > 32.0*  --  30.0* 34.4*  --   PLT 422*  --   --   --  437*  --   --   --   --  129*  --   CREATININE 0.94  --   --   --   --    < >  --  4.70*  --  6.13*  6.01* 4.96*  TROPONINIHS  --    < > 16  --  30*  --   --   --   --   --  >24,000*   < > = values in this interval not displayed.    Estimated Creatinine Clearance: 16 mL/min (A) (by C-G formula based on SCr of 4.96 mg/dL (H)).  Assessment: 51 year old female s/p difficult incisional hernia repair with long lysis of adhesion 9/26 currently on TPN and CRRT therapy now to begin heparin for ACS/MI,   received sq heparin at 14:30 pm  Goal of Therapy:  Heparin level 0.3-0.7 units/ml Monitor platelets by anticoagulation protocol: Yes   Plan:  No heparin bolus given recent surgery, CRRT therapy and recent sq heparin  Heparin drip at 1100 units / hr Heparin level at 4 am Daily heparin level, CBC  Thank you. Anette Guarneri, PharmD 05/01/2022,6:50 PM

## 2022-05-01 NOTE — Progress Notes (Signed)
Discussed case with Dr. Ali Lowe. No obstructive CAD, but in decompensated heart failure. Off vasopressors, may need NGT infusion to keep MAP ~70-75 to maintain adequate perfusion. Femoral swan in place. Transferring to [place], can go back on CRRT overnight.    Based on ABG result, giving 2g calcium gluconate for low ionized calcium. Coox now and in AM.  Julian Hy, DO 05/01/22 10:00 PM [facility]

## 2022-05-02 ENCOUNTER — Encounter (HOSPITAL_COMMUNITY): Payer: Self-pay | Admitting: Internal Medicine

## 2022-05-02 ENCOUNTER — Inpatient Hospital Stay (HOSPITAL_COMMUNITY): Payer: Medicaid Other

## 2022-05-02 DIAGNOSIS — I340 Nonrheumatic mitral (valve) insufficiency: Secondary | ICD-10-CM

## 2022-05-02 DIAGNOSIS — I5040 Unspecified combined systolic (congestive) and diastolic (congestive) heart failure: Secondary | ICD-10-CM | POA: Diagnosis not present

## 2022-05-02 DIAGNOSIS — R6521 Severe sepsis with septic shock: Secondary | ICD-10-CM | POA: Diagnosis not present

## 2022-05-02 DIAGNOSIS — A419 Sepsis, unspecified organism: Secondary | ICD-10-CM | POA: Diagnosis not present

## 2022-05-02 DIAGNOSIS — J690 Pneumonitis due to inhalation of food and vomit: Secondary | ICD-10-CM | POA: Diagnosis not present

## 2022-05-02 LAB — LACTIC ACID, PLASMA
Lactic Acid, Venous: 4.6 mmol/L (ref 0.5–1.9)
Lactic Acid, Venous: 3.9 mmol/L (ref 0.5–1.9)
Lactic Acid, Venous: 2.8 mmol/L (ref 0.5–1.9)

## 2022-05-02 LAB — RENAL FUNCTION PANEL
Albumin: 1.7 g/dL — ABNORMAL LOW (ref 3.5–5.0)
Albumin: 1.7 g/dL — ABNORMAL LOW (ref 3.5–5.0)
Anion gap: 15 (ref 5–15)
Anion gap: 16 — ABNORMAL HIGH (ref 5–15)
BUN: 51 mg/dL — ABNORMAL HIGH (ref 6–20)
BUN: 43 mg/dL — ABNORMAL HIGH (ref 6–20)
CO2: 17 mmol/L — ABNORMAL LOW (ref 22–32)
CO2: 19 mmol/L — ABNORMAL LOW (ref 22–32)
Calcium: 6.5 mg/dL — ABNORMAL LOW (ref 8.9–10.3)
Calcium: 7.7 mg/dL — ABNORMAL LOW (ref 8.9–10.3)
Chloride: 99 mmol/L (ref 98–111)
Chloride: 101 mmol/L (ref 98–111)
Creatinine, Ser: 4.02 mg/dL — ABNORMAL HIGH (ref 0.44–1.00)
Creatinine, Ser: 3.01 mg/dL — ABNORMAL HIGH (ref 0.44–1.00)
GFR, Estimated: 13 mL/min — ABNORMAL LOW (ref >60)
GFR, Estimated: 18 mL/min — ABNORMAL LOW (ref >60)
Glucose, Bld: 251 mg/dL — ABNORMAL HIGH (ref 70–99)
Glucose, Bld: 234 mg/dL — ABNORMAL HIGH (ref 70–99)
Phosphorus: 4 mg/dL (ref 2.5–4.6)
Phosphorus: 2.5 mg/dL (ref 2.5–4.6)
Potassium: 4.3 mmol/L (ref 3.5–5.1)
Potassium: 4.2 mmol/L (ref 3.5–5.1)
Sodium: 131 mmol/L — ABNORMAL LOW (ref 135–145)
Sodium: 136 mmol/L (ref 135–145)

## 2022-05-02 LAB — POCT I-STAT 7, (LYTES, BLD GAS, ICA,H+H)
Acid-base deficit: 8 mmol/L — ABNORMAL HIGH (ref 0.0–2.0)
Bicarbonate: 18.4 mmol/L — ABNORMAL LOW (ref 20.0–28.0)
Calcium, Ion: 0.91 mmol/L — ABNORMAL LOW (ref 1.15–1.40)
HCT: 34 % — ABNORMAL LOW (ref 36.0–46.0)
Hemoglobin: 11.6 g/dL — ABNORMAL LOW (ref 12.0–15.0)
O2 Saturation: 99 %
Patient temperature: 35.7
Potassium: 4.3 mmol/L (ref 3.5–5.1)
Sodium: 134 mmol/L — ABNORMAL LOW (ref 135–145)
TCO2: 20 mmol/L — ABNORMAL LOW (ref 22–32)
pCO2 arterial: 38.1 mmHg (ref 32–48)
pH, Arterial: 7.284 — ABNORMAL LOW (ref 7.35–7.45)
pO2, Arterial: 145 mmHg — ABNORMAL HIGH (ref 83–108)

## 2022-05-02 LAB — CBC
HCT: 32.1 % — ABNORMAL LOW (ref 36.0–46.0)
HCT: 33.8 % — ABNORMAL LOW (ref 36.0–46.0)
Hemoglobin: 10.3 g/dL — ABNORMAL LOW (ref 12.0–15.0)
Hemoglobin: 11.1 g/dL — ABNORMAL LOW (ref 12.0–15.0)
MCH: 27.6 pg (ref 26.0–34.0)
MCH: 27.6 pg (ref 26.0–34.0)
MCHC: 32.1 g/dL (ref 30.0–36.0)
MCHC: 32.8 g/dL (ref 30.0–36.0)
MCV: 86.1 fL (ref 80.0–100.0)
MCV: 84.1 fL (ref 80.0–100.0)
Platelets: 61 10*3/uL — ABNORMAL LOW (ref 150–400)
Platelets: 57 10*3/uL — ABNORMAL LOW (ref 150–400)
RBC: 3.73 MIL/uL — ABNORMAL LOW (ref 3.87–5.11)
RBC: 4.02 MIL/uL (ref 3.87–5.11)
RDW: 15.4 % (ref 11.5–15.5)
RDW: 15.2 % (ref 11.5–15.5)
WBC: 6.9 10*3/uL (ref 4.0–10.5)
WBC: 7.9 10*3/uL (ref 4.0–10.5)
nRBC: 15 % — ABNORMAL HIGH (ref 0.0–0.2)
nRBC: 10.7 % — ABNORMAL HIGH (ref 0.0–0.2)

## 2022-05-02 LAB — POCT I-STAT EG7
Acid-base deficit: 8 mmol/L — ABNORMAL HIGH (ref 0.0–2.0)
Bicarbonate: 19.9 mmol/L — ABNORMAL LOW (ref 20.0–28.0)
Calcium, Ion: 0.82 mmol/L — CL (ref 1.15–1.40)
HCT: 33 % — ABNORMAL LOW (ref 36.0–46.0)
Hemoglobin: 11.2 g/dL — ABNORMAL LOW (ref 12.0–15.0)
O2 Saturation: 45 %
Potassium: 3.6 mmol/L (ref 3.5–5.1)
Sodium: 133 mmol/L — ABNORMAL LOW (ref 135–145)
TCO2: 21 mmol/L — ABNORMAL LOW (ref 22–32)
pCO2, Ven: 48.3 mmHg (ref 44–60)
pH, Ven: 7.223 — ABNORMAL LOW (ref 7.25–7.43)
pO2, Ven: 30 mmHg — CL (ref 32–45)

## 2022-05-02 LAB — COMPREHENSIVE METABOLIC PANEL
ALT: 439 U/L — ABNORMAL HIGH (ref 0–44)
AST: 700 U/L — ABNORMAL HIGH (ref 15–41)
Albumin: 1.6 g/dL — ABNORMAL LOW (ref 3.5–5.0)
Alkaline Phosphatase: 125 U/L (ref 38–126)
Anion gap: 17 — ABNORMAL HIGH (ref 5–15)
BUN: 62 mg/dL — ABNORMAL HIGH (ref 6–20)
CO2: 15 mmol/L — ABNORMAL LOW (ref 22–32)
Calcium: 5.5 mg/dL — CL (ref 8.9–10.3)
Chloride: 101 mmol/L (ref 98–111)
Creatinine, Ser: 4.93 mg/dL — ABNORMAL HIGH (ref 0.44–1.00)
GFR, Estimated: 10 mL/min — ABNORMAL LOW (ref >60)
Glucose, Bld: 240 mg/dL — ABNORMAL HIGH (ref 70–99)
Potassium: 3.4 mmol/L — ABNORMAL LOW (ref 3.5–5.1)
Sodium: 133 mmol/L — ABNORMAL LOW (ref 135–145)
Total Bilirubin: 1.7 mg/dL — ABNORMAL HIGH (ref 0.3–1.2)
Total Protein: 4.5 g/dL — ABNORMAL LOW (ref 6.5–8.1)

## 2022-05-02 LAB — GLUCOSE, CAPILLARY
Glucose-Capillary: 234 mg/dL — ABNORMAL HIGH (ref 70–99)
Glucose-Capillary: 239 mg/dL — ABNORMAL HIGH (ref 70–99)
Glucose-Capillary: 252 mg/dL — ABNORMAL HIGH (ref 70–99)
Glucose-Capillary: 225 mg/dL — ABNORMAL HIGH (ref 70–99)
Glucose-Capillary: 208 mg/dL — ABNORMAL HIGH (ref 70–99)

## 2022-05-02 LAB — COOXEMETRY PANEL
Carboxyhemoglobin: 0.9 % (ref 0.5–1.5)
Carboxyhemoglobin: 0.6 % (ref 0.5–1.5)
Carboxyhemoglobin: 0.5 % (ref 0.5–1.5)
Carboxyhemoglobin: 1.1 % (ref 0.5–1.5)
Methemoglobin: <0.7 % (ref 0.0–1.5)
Methemoglobin: <0.7 % (ref 0.0–1.5)
Methemoglobin: <0.7 % (ref 0.0–1.5)
Methemoglobin: <0.7 % (ref 0.0–1.5)
O2 Saturation: 49.5 %
O2 Saturation: 50.2 %
O2 Saturation: 49.3 %
O2 Saturation: 65.8 %
Total hemoglobin: 9.6 g/dL — ABNORMAL LOW (ref 12.0–16.0)
Total hemoglobin: 10.9 g/dL — ABNORMAL LOW (ref 12.0–16.0)
Total hemoglobin: 12 g/dL (ref 12.0–16.0)
Total hemoglobin: 9.8 g/dL — ABNORMAL LOW (ref 12.0–16.0)

## 2022-05-02 LAB — TRIGLYCERIDES: Triglycerides: 828 mg/dL — ABNORMAL HIGH (ref <150)

## 2022-05-02 LAB — POCT ACTIVATED CLOTTING TIME: Activated Clotting Time: 287 seconds

## 2022-05-02 LAB — MAGNESIUM
Magnesium: 1.8 mg/dL (ref 1.7–2.4)
Magnesium: 2.2 mg/dL (ref 1.7–2.4)

## 2022-05-02 MED ORDER — SODIUM CHLORIDE 0.9 % IV BOLUS
250.0000 mL | Freq: Once | INTRAVENOUS | Status: AC
  Administered 2022-05-02: 250 mL via INTRAVENOUS

## 2022-05-02 MED ORDER — PERFLUTREN LIPID MICROSPHERE
1.0000 mL | INTRAVENOUS | Status: AC | PRN
  Administered 2022-05-02: 2 mL via INTRAVENOUS

## 2022-05-02 MED ORDER — INSULIN ASPART 100 UNIT/ML IJ SOLN
0.0000 [IU] | INTRAMUSCULAR | Status: DC
  Administered 2022-05-02: 7 [IU] via SUBCUTANEOUS
  Administered 2022-05-02: 11 [IU] via SUBCUTANEOUS
  Administered 2022-05-02: 7 [IU] via SUBCUTANEOUS
  Administered 2022-05-03: 4 [IU] via SUBCUTANEOUS
  Administered 2022-05-03: 7 [IU] via SUBCUTANEOUS
  Administered 2022-05-03 (×2): 4 [IU] via SUBCUTANEOUS
  Administered 2022-05-03: 7 [IU] via SUBCUTANEOUS
  Administered 2022-05-03: 4 [IU] via SUBCUTANEOUS
  Administered 2022-05-03: 7 [IU] via SUBCUTANEOUS
  Administered 2022-05-04: 3 [IU] via SUBCUTANEOUS
  Administered 2022-05-04: 4 [IU] via SUBCUTANEOUS
  Administered 2022-05-04: 7 [IU] via SUBCUTANEOUS
  Administered 2022-05-04: 3 [IU] via SUBCUTANEOUS
  Administered 2022-05-04 (×2): 7 [IU] via SUBCUTANEOUS
  Administered 2022-05-05 (×2): 4 [IU] via SUBCUTANEOUS
  Administered 2022-05-05: 3 [IU] via SUBCUTANEOUS
  Administered 2022-05-05: 7 [IU] via SUBCUTANEOUS
  Administered 2022-05-05 (×2): 4 [IU] via SUBCUTANEOUS
  Administered 2022-05-06 (×2): 3 [IU] via SUBCUTANEOUS
  Administered 2022-05-06: 4 [IU] via SUBCUTANEOUS
  Administered 2022-05-06 (×2): 3 [IU] via SUBCUTANEOUS
  Administered 2022-05-07: 4 [IU] via SUBCUTANEOUS
  Administered 2022-05-07 (×2): 7 [IU] via SUBCUTANEOUS
  Administered 2022-05-07 (×2): 4 [IU] via SUBCUTANEOUS
  Administered 2022-05-07 – 2022-05-08 (×2): 7 [IU] via SUBCUTANEOUS
  Administered 2022-05-08 (×6): 4 [IU] via SUBCUTANEOUS
  Administered 2022-05-09: 3 [IU] via SUBCUTANEOUS
  Administered 2022-05-09: 4 [IU] via SUBCUTANEOUS
  Administered 2022-05-09 (×2): 3 [IU] via SUBCUTANEOUS
  Administered 2022-05-09 (×2): 4 [IU] via SUBCUTANEOUS
  Administered 2022-05-10 – 2022-05-11 (×6): 3 [IU] via SUBCUTANEOUS
  Administered 2022-05-11: 4 [IU] via SUBCUTANEOUS
  Administered 2022-05-11 – 2022-05-12 (×5): 3 [IU] via SUBCUTANEOUS
  Administered 2022-05-12: 4 [IU] via SUBCUTANEOUS
  Administered 2022-05-12 – 2022-05-15 (×7): 3 [IU] via SUBCUTANEOUS
  Administered 2022-05-15: 4 [IU] via SUBCUTANEOUS
  Administered 2022-05-15 – 2022-05-16 (×4): 3 [IU] via SUBCUTANEOUS
  Administered 2022-05-16 (×2): 4 [IU] via SUBCUTANEOUS
  Administered 2022-05-17 – 2022-05-25 (×25): 3 [IU] via SUBCUTANEOUS
  Administered 2022-05-26: 4 [IU] via SUBCUTANEOUS
  Administered 2022-05-26 – 2022-05-27 (×2): 3 [IU] via SUBCUTANEOUS
  Administered 2022-05-27: 7 [IU] via SUBCUTANEOUS
  Administered 2022-05-27: 3 [IU] via SUBCUTANEOUS

## 2022-05-02 MED ORDER — POTASSIUM CHLORIDE 10 MEQ/50ML IV SOLN
10.0000 meq | INTRAVENOUS | Status: AC
  Administered 2022-05-02 (×4): 10 meq via INTRAVENOUS
  Filled 2022-05-02 (×4): qty 50

## 2022-05-02 MED ORDER — INSULIN ASPART 100 UNIT/ML IJ SOLN
4.0000 [IU] | INTRAMUSCULAR | Status: AC
  Administered 2022-05-02 (×2): 4 [IU] via SUBCUTANEOUS

## 2022-05-02 MED ORDER — DOCUSATE SODIUM 50 MG/5ML PO LIQD
100.0000 mg | Freq: Two times a day (BID) | ORAL | Status: DC
  Administered 2022-05-02 – 2022-05-08 (×11): 100 mg
  Filled 2022-05-02 (×11): qty 10

## 2022-05-02 MED ORDER — POLYETHYLENE GLYCOL 3350 17 G PO PACK
17.0000 g | PACK | Freq: Every day | ORAL | Status: DC
  Administered 2022-05-05 – 2022-05-08 (×4): 17 g
  Filled 2022-05-02 (×5): qty 1

## 2022-05-02 MED ORDER — DEXMEDETOMIDINE HCL IN NACL 400 MCG/100ML IV SOLN
0.0000 ug/kg/h | INTRAVENOUS | Status: AC
  Administered 2022-05-02: 0.7 ug/kg/h via INTRAVENOUS
  Administered 2022-05-02: 0.4 ug/kg/h via INTRAVENOUS
  Administered 2022-05-03: 0.8 ug/kg/h via INTRAVENOUS
  Administered 2022-05-03 – 2022-05-04 (×11): 1.2 ug/kg/h via INTRAVENOUS
  Administered 2022-05-05: 0.4 ug/kg/h via INTRAVENOUS
  Administered 2022-05-05: 0.2 ug/kg/h via INTRAVENOUS
  Filled 2022-05-02 (×7): qty 100
  Filled 2022-05-02: qty 200
  Filled 2022-05-02 (×6): qty 100

## 2022-05-02 MED ORDER — TRACE MINERALS CU-MN-SE-ZN 300-55-60-3000 MCG/ML IV SOLN
INTRAVENOUS | Status: AC
  Filled 2022-05-02: qty 863.2

## 2022-05-02 MED ORDER — SODIUM CHLORIDE 0.9 % IV SOLN
4.0000 g | Freq: Once | INTRAVENOUS | Status: AC
  Administered 2022-05-02: 4 g via INTRAVENOUS
  Filled 2022-05-02: qty 40

## 2022-05-02 MED FILL — Nitroglycerin IV Soln 100 MCG/ML in D5W: INTRA_ARTERIAL | Qty: 10 | Status: AC

## 2022-05-02 NOTE — Progress Notes (Signed)
Progress Note: General Surgery Service   Chief Complaint/Subjective:  Heart cath last night.  Now in cardiac ICU.  Talked with family member at bedside.  Objective: Vital signs in last 24 hours: Temp:  [94.4 F (34.7 C)-99.1 F (37.3 C)] 97.3 F (36.3 C) (10/05 1145) Pulse Rate:  [88-140] 114 (10/05 1145) Resp:  [0-33] 15 (10/05 1145) BP: (87)/(61) 87/61 (10/05 0735) SpO2:  [84 %-100 %] 100 % (10/05 1145) Arterial Line BP: (77-138)/(53-100) 115/78 (10/05 1145) FiO2 (%):  [40 %] 40 % (10/05 0800) Last BM Date : 05/01/22 (FMS present)  Intake/Output from previous day: 10/04 0701 - 10/05 0700 In: 3411.2 [I.V.:2854; IV Piggyback:557.2] Out: 2076 [Urine:30] Intake/Output this shift: Total I/O In: 1164.6 [I.V.:1044.6; NG/GT:70; IV Piggyback:50] Out: 1036 [Urine:10]   GEN: intubated, sedated GI: incisions c/d/I w/ glue, heparin sites with some bleeding bandaged.  Lab Results: CBC  Recent Labs    05/01/22 2325 05/02/22 0409 05/02/22 0410  WBC 6.9  --  7.9  HGB 10.3* 11.6* 11.1*  HCT 32.1* 34.0* 33.8*  PLT 61*  --  57*    BMET Recent Labs    05/01/22 2325 05/02/22 0409 05/02/22 0410  NA 133* 134* 131*  K 3.4* 4.3 4.3  CL 101  --  99  CO2 15*  --  17*  GLUCOSE 240*  --  251*  BUN 62*  --  51*  CREATININE 4.93*  --  4.02*  CALCIUM 5.5*  --  6.5*    PT/INR No results for input(s): "LABPROT", "INR" in the last 72 hours. ABG Recent Labs    05/01/22 2119 05/01/22 2138 05/01/22 2142 05/02/22 0409  PHART 7.246*  --   --  7.284*  HCO3 18.7*   < > 19.9* 18.4*   < > = values in this interval not displayed.     Anti-infectives: Anti-infectives (From admission, onward)    Start     Dose/Rate Route Frequency Ordered Stop   05/01/22 1800  piperacillin-tazobactam (ZOSYN) IVPB 3.375 g  Status:  Discontinued        3.375 g 12.5 mL/hr over 240 Minutes Intravenous Every 6 hours 05/01/22 1426 05/01/22 1427   05/01/22 1515  piperacillin-tazobactam (ZOSYN) IVPB  3.375 g        3.375 g 100 mL/hr over 30 Minutes Intravenous Every 6 hours 05/01/22 1428     04/30/22 1400  piperacillin-tazobactam (ZOSYN) IVPB 2.25 g  Status:  Discontinued        2.25 g 100 mL/hr over 30 Minutes Intravenous Every 8 hours 04/30/22 1325 05/01/22 1426   04/29/22 1530  piperacillin-tazobactam (ZOSYN) IVPB 3.375 g  Status:  Discontinued        3.375 g 12.5 mL/hr over 240 Minutes Intravenous Every 8 hours 04/29/22 1433 04/30/22 1325   04/29/22 1145  metroNIDAZOLE (FLAGYL) IVPB 500 mg  Status:  Discontinued        500 mg 100 mL/hr over 60 Minutes Intravenous Every 12 hours 04/29/22 1047 04/29/22 1433   04/29/22 1145  vancomycin (VANCOCIN) IVPB 1000 mg/200 mL premix  Status:  Discontinued        1,000 mg 200 mL/hr over 60 Minutes Intravenous  Once 04/29/22 1047 04/29/22 1055   04/29/22 1145  vancomycin (VANCOREADY) IVPB 1500 mg/300 mL  Status:  Discontinued        1,500 mg 150 mL/hr over 120 Minutes Intravenous Every 24 hours 04/29/22 1055 04/29/22 1357   04/29/22 1045  ceFEPIme (MAXIPIME) 2 g in sodium chloride 0.9 %  100 mL IVPB  Status:  Discontinued        2 g 200 mL/hr over 30 Minutes Intravenous Every 8 hours 04/29/22 0954 04/29/22 1433   04/23/22 0730  ceFAZolin (ANCEF) IVPB 2g/100 mL premix        2 g 200 mL/hr over 30 Minutes Intravenous On call to O.R. 04/23/22 0726 04/23/22 0828       Medications: Scheduled Meds:  Chlorhexidine Gluconate Cloth  6 each Topical Daily   docusate  100 mg Per Tube BID   heparin  5,000 Units Subcutaneous Q8H   hydrocortisone sod succinate (SOLU-CORTEF) inj  100 mg Intravenous Q12H   insulin aspart  0-20 Units Subcutaneous Q4H   insulin aspart  4 Units Subcutaneous Q4H   mouth rinse  15 mL Mouth Rinse Q2H   pantoprazole (PROTONIX) IV  40 mg Intravenous Q24H   polyethylene glycol  17 g Per Tube Daily   sodium chloride flush  10-40 mL Intracatheter Q12H   sodium chloride flush  3 mL Intravenous Q12H   Continuous Infusions:    prismasol BGK 4/2.5 400 mL/hr at 05/02/22 1151    prismasol BGK 4/2.5 400 mL/hr at 05/02/22 1151   sodium chloride     sodium chloride 10 mL/hr at 05/02/22 1200   sodium chloride     sodium chloride 10 mL/hr at 05/02/22 1200   sodium chloride     acetaminophen     DOBUTamine 5 mcg/kg/min (05/02/22 1200)   fentaNYL infusion INTRAVENOUS 200 mcg/hr (05/02/22 1200)   methocarbamol (ROBAXIN) IV Stopped (04/29/22 1354)   midazolam     norepinephrine (LEVOPHED) Adult infusion 20 mcg/min (05/02/22 1302)   piperacillin-tazobactam Stopped (05/02/22 0914)   prismasol BGK 4/2.5 1,500 mL/hr at 05/02/22 1248   TPN ADULT (ION) 65 mL/hr at 05/02/22 1200   TPN ADULT (ION)     vasopressin 0.03 Units/min (05/02/22 1200)   PRN Meds:.sodium chloride, sodium chloride, acetaminophen, acetaminophen, alum & mag hydroxide-simeth, diphenhydrAMINE, fentaNYL, fentaNYL (SUBLIMAZE) injection, fentaNYL (SUBLIMAZE) injection, heparin, HYDROmorphone (DILAUDID) injection, hydrOXYzine, methocarbamol (ROBAXIN) IV, midazolam, ondansetron (ZOFRAN) IV, mouth rinse, prochlorperazine, simethicone, sodium chloride, sodium chloride flush, sodium chloride flush  Assessment/Plan: Ms. 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 Use [email or web address] to contact on call provider  Daily Billing: [phone] - post op

## 2022-05-02 NOTE — Progress Notes (Signed)
Monitored pt through CATH lab. Required increase RR to accommodate pt resp demand attempting to correct metaboloic acidosis. Returned RR to 28 when pt was synchronous w/vent settings again. Transported pt to 2H18 on 100% w/settings as per flowsheet w/out incidence.

## 2022-05-02 NOTE — Progress Notes (Signed)
Dr. Daniel Nones at bedside. CVP is 6. Verbal order for 224m NS Bolus, recheck CVP. If CVP is less than 9, give another 250 NS Bolus. CVP was 7 so another bolus was given. CVP is 9 now.

## 2022-05-02 NOTE — Progress Notes (Signed)
Patient ID: Patricia [name], female   DOB: 29-May-1971, 51 y.o.   MRN: [id number] [facility] Progress Note   Assessment/ Plan:   1.  Acute kidney injury: This appears to be likely from ATN associated with septic shock/sepsis.  Remains on pressor support/hemodynamically unstable we will continue CRRT at the current prescription. 2.  Anion gap metabolic acidosis: This appears to be largely from lactic acidosis associated with septic shock as well as acute kidney injury.  Slowly improving with CRRT. 3.  Septic shock now with superimposed cardiogenic shock: Likely related to aspiration pneumonia versus acute abdominal event.  On inotropic support with dobutamine and antimicrobial coverage with Zosyn. 4.  Acute hypoxic respiratory failure: Secondary to septic shock/aspiration pneumonia event.  Ventilator management per CCM. 5.  Status post robotic incisional hernia repair with mesh/lysis of adhesions: Continue monitoring and management per surgical service.  Subjective:   Found to have evidence of ST elevation MI yesterday and underwent emergent coronary angiogram that showed normal right dominant circulation without occlusion, spasm or dissection.  Evidence of cardiogenic shock noted.   Objective:   BP 95/60   Pulse (!) 116   Temp (!) 96.4 F (35.8 C)   Resp (!) 9   Ht '5\' 4"'$  (1.626 m)   Wt 109.3 kg   LMP 07/07/2014 (Approximate)   SpO2 100%   BMI 41.37 kg/m   Intake/Output Summary (Last 24 hours) at 05/02/2022 0836 Last data filed at 05/02/2022 0600 Gross per 24 hour  Intake 2962.99 ml  Output 2076 ml  Net 886.99 ml   Weight change:   Physical Exam: Gen: Intubated, sedated, appears comfortable CVS: Pulse regular tachycardia, S1 and S2 normal Resp: Anteriorly clear to auscultation, no distinct rales or rhonchi Abd: Soft, moderately distended, no bowel sounds audible.  Intact laparoscopic surgery incision sites Ext: Extremities cool to touch, trace ankle  edema  Imaging: CARDIAC CATHETERIZATION  Result Date: 05/01/2022 1.  Normal right dominant circulation without of thrombotic occlusion, spasm, or dissection. 2.  Cardiogenic shock with a cardiac output of 3.96 L/min and a cardiac index of 1.87 L/min/m.  The mean RA pressure was 13 mmHg, the mean wedge pressure was 19 mmHg, and the mean PA pressure was 29 mmHg; the PAPI is 0.7.  LVEDP is 16 mmHg.  The MAP at this time was 100 mmHg with the patient on both norepinephrine and vasopressin.  Dobutamine 5 mcg/kg/min was initiated, pressors were stopped and MAP goal of around 70 to 75 mmHg will be pursued.  This was discussed with pulmonary critical care. Recommendations: Continue dobutamine with map goal of 70 to 75 mmHg and resume CRRT for volume removal and management of acid-base status.   DG CHEST PORT 1 VIEW  Result Date: 05/01/2022 CLINICAL DATA:  Central line placement. EXAM: PORTABLE CHEST 1 VIEW COMPARISON:  One-view chest x-ray 04/30/2022. FINDINGS: Endotracheal tube is stable, 3.7 cm above the carina. Side port of the NG tube is in the fundus the stomach. A left IJ line is stable, terminating at the cavoatrial junction. Right-sided PICC line and extends just beyond the left IJ line. A right IJ line is new, terminating above the cavoatrial junction. No pneumothorax is present. Heart size is exaggerate by low lung volumes. Left greater than right basilar airspace disease is stable. IMPRESSION: 1. Interval placement of right IJ line without radiographic evidence for complication. 2. Stable left IJ line. 3. Stable left greater than right basilar airspace disease. 4. Stable endotracheal tube, 3.7 cm above  the carina. 5. Low lung volumes with persistent left lower lobe airspace disease concerning for pneumonia or aspiration. Electronically Signed   By: San Morelle M.D.   On: 05/01/2022 11:51   DG CHEST PORT 1 VIEW  Result Date: 04/30/2022 CLINICAL DATA:  Mental status change EXAM: PORTABLE  CHEST 1 VIEW COMPARISON:  Chest x-ray dated April 30, 2022 FINDINGS: Stable position of ET tube, right arm PICC, and left IJ line. Enteric tube with tip over the proximal stomach and side port near the GE junction, recommend advancement for optimal positioning. Cardiac and mediastinal contours are unchanged. Similar retrocardiac opacities. Increased right lower lung perihilar opacities. No evidence of large pleural effusion or pneumothorax. IMPRESSION: 1. Enteric tube with tip over the proximal stomach and side port near the GE junction, recommend advancement for optimal positioning. 2. Increased right lower lung perihilar opacities, may represent atelectasis or infection/aspiration. 3. Similar left retrocardiac opacities. Electronically Signed   By: Yetta Glassman M.D.   On: 04/30/2022 16:15   ECHOCARDIOGRAM COMPLETE  Result Date: 04/30/2022    ECHOCARDIOGRAM REPORT   Patient Name:   Patricia [name] [facility] Date of Exam: 04/30/2022 Medical Rec #:  [id number]          Height:       64.0 in Accession #:    [id number]         Weight:       241.0 lb Date of Birth:  August 06, 1970          BSA:          2.118 m Patient Age:    77 years           BP:           96/63 mmHg Patient Gender: F                  HR:           125 bpm. Exam Location:  Inpatient Procedure: 2D Echo, Cardiac Doppler and Color Doppler Indications:    Bacteremia  History:        Patient has prior history of Echocardiogram examinations, most                 recent 07/20/2014. Sepsis; Risk Factors:Hypertension and                 Diabetes.  Sonographer:    Johny Chess RDCS Referring Phys: [place] Comments: Echo performed with patient supine and on artificial respirator. Image acquisition challenging due to patient body habitus. IMPRESSIONS  1. Left ventricular ejection fraction, by estimation, is 30 to 35%. The left ventricle has moderately decreased function. The left ventricle demonstrates global hypokinesis. Indeterminate  diastolic filling due to E-A fusion.  2. Right ventricular systolic function is moderately reduced. The right ventricular size is normal. The estimated right ventricular systolic pressure is 93.8 mmHg.  3. Left atrial size was mildly dilated.  4. The mitral valve is normal in structure. Mild to moderate mitral valve regurgitation.  5. The aortic valve is tricuspid. Aortic valve regurgitation is not visualized. No aortic stenosis is present.  6. The inferior vena cava is collapsed throughout the respiratory cycle, which is unusual during mechanical ventilation and suggests low right atrial pressure/hypovolemia. Comparison(s): Prior images unable to be directly viewed, comparison made by report only. Conclusion(s)/Recommendation(s): No evidence of valvular vegetations on this transthoracic echocardiogram. Consider a transesophageal echocardiogram to exclude infective endocarditis if clinically indicated. Flow velocities are  low across all valves consistent with reduced cardiac output. Collapsed IVC suggests hypovolemia in addition to biventricular systolic dysfunction. FINDINGS  Left Ventricle: Left ventricular ejection fraction, by estimation, is 30 to 35%. The left ventricle has moderately decreased function. The left ventricle demonstrates global hypokinesis. The left ventricular internal cavity size was normal in size. There is no left ventricular hypertrophy. Indeterminate diastolic filling due to E-A fusion. Right Ventricle: The right ventricular size is normal. No increase in right ventricular wall thickness. Right ventricular systolic function is moderately reduced. The tricuspid regurgitant velocity is 2.24 m/s, and with an assumed right atrial pressure of 0 mmHg, the estimated right ventricular systolic pressure is 83.1 mmHg. Left Atrium: Left atrial size was mildly dilated. Right Atrium: Right atrial size was normal in size. Pericardium: There is no evidence of pericardial effusion. Mitral Valve: The mitral  valve is normal in structure. Mild to moderate mitral valve regurgitation, with centrally-directed jet. Tricuspid Valve: The tricuspid valve is normal in structure. Tricuspid valve regurgitation is mild. Aortic Valve: The aortic valve is tricuspid. Aortic valve regurgitation is not visualized. No aortic stenosis is present. Pulmonic Valve: The pulmonic valve was not well visualized. Pulmonic valve regurgitation is not visualized. Aorta: The aortic root and ascending aorta are structurally normal, with no evidence of dilitation. Venous: The inferior vena cava is collapsed throughout the respiratory cycle, which is unusual during mechanical ventilation and suggests low right atrial pressure/hypovolemia. IAS/Shunts: No atrial level shunt detected by color flow Doppler.  LEFT VENTRICLE PLAX 2D LVIDd:         4.90 cm LVIDs:         4.10 cm LV PW:         1.00 cm LV IVS:        0.80 cm LVOT diam:     1.90 cm LVOT Area:     2.84 cm  RIGHT VENTRICLE RV S prime:     8.16 cm/s TAPSE (M-mode): 1.0 cm LEFT ATRIUM           Index        RIGHT ATRIUM          Index LA diam:      3.90 cm 1.84 cm/m   RA Area:     8.10 cm LA Vol (A2C): 40.2 ml 18.98 ml/m  RA Volume:   14.20 ml 6.71 ml/m LA Vol (A4C): 45.3 ml 21.39 ml/m   AORTA Ao Root diam: 3.30 cm Ao Asc diam:  3.50 cm TRICUSPID VALVE TR Peak grad:   20.1 mmHg TR Vmax:        224.00 cm/s  SHUNTS Systemic Diam: 1.90 cm Mihai Croitoru MD Electronically signed by Sanda Klein MD Signature Date/Time: 04/30/2022/11:08:35 AM    Final    DG Abd Portable 1V  Result Date: 04/30/2022 CLINICAL DATA:  OG tube placement EXAM: PORTABLE ABDOMEN - 1 VIEW COMPARISON:  KUB 1 day prior and same day CT chest/abdomen/pelvis FINDINGS: The enteric catheter is coiled in the stomach with the tip projecting over the region of the pylorus. There is diffuse gaseous distention of the bowel as seen on same day CT chest/abdomen/pelvis. IMPRESSION: Enteric catheter coiled in the stomach. Electronically  Signed   By: Valetta Mole M.D.   On: 04/30/2022 10:22    Labs: [facility] Recent Labs  Lab 04/29/22 0031 04/29/22 5176 04/29/22 1642 04/30/22 0055 04/30/22 0144 04/30/22 1301 04/30/22 2108 05/01/22 0416 05/01/22 1600 05/01/22 2119 05/01/22 2138 05/01/22 2142 05/01/22 2325 05/02/22 0409 05/02/22 0410  NA 138  --    < > 142   < > 139   < > 133*  136 134* 132* 140 133* 133* 134* 131*  K 3.6  --    < > 4.2   < > 4.7   < > 3.4*  3.5 3.5 3.7 2.9* 3.6 3.4* 4.3 4.3  CL 106  --   --  109  --  102  --  97*  97* 99  --   --   --  101  --  99  CO2 21*  --   --  11*  --  18*  --  15*  16* 16*  --   --   --  15*  --  17*  GLUCOSE 134*  --   --  132*  --  189*  --  190*  191* 192*  --   --   --  240*  --  251*  BUN 13  --   --  45*  --  54*  --  71*  69* 60*  --   --   --  62*  --  51*  CREATININE 0.94  --   --  4.68*  --  4.70*  --  6.01*  6.13* 4.96*  --   --   --  4.93*  --  4.02*  CALCIUM 8.9  --   --  7.4*  --  6.0*  --  6.6*  6.6* 6.4*  --   --   --  5.5*  --  6.5*  PHOS  --  2.2*  --  3.2  --   --   --  6.3* 5.5*  --   --   --   --   --  4.0   < > = values in this interval not displayed.   CBC Recent Labs  Lab 04/30/22 0037 04/30/22 0144 05/01/22 0416 05/01/22 2119 05/01/22 2142 05/01/22 2325 05/02/22 0409 05/02/22 0410  WBC 7.7  --  10.0  --   --  6.9  --  7.9  NEUTROABS 5.8  --   --   --   --   --   --   --   HGB 11.3*   < > 11.4*   < > 11.2* 10.3* 11.6* 11.1*  HCT 35.6*   < > 34.4*   < > 33.0* 32.1* 34.0* 33.8*  MCV 84.6  --  82.3  --   --  86.1  --  84.1  PLT 437*  --  129*  --   --  61*  --  57*   < > = values in this interval not displayed.    Medications:     Chlorhexidine Gluconate Cloth  6 each Topical Daily   docusate  100 mg Per Tube BID   heparin  5,000 Units Subcutaneous Q8H   hydrocortisone sod succinate (SOLU-CORTEF) inj  100 mg Intravenous Q12H   insulin aspart  0-15 Units Subcutaneous Q4H   mouth rinse  15 mL Mouth Rinse Q2H   pantoprazole  (PROTONIX) IV  40 mg Intravenous Q24H   polyethylene glycol  17 g Per Tube Daily   sodium chloride flush  10-40 mL Intracatheter Q12H   sodium chloride flush  3 mL Intravenous Q12H   Elmarie Shiley, MD 05/02/2022, 8:36 AM

## 2022-05-02 NOTE — Progress Notes (Signed)
Brief Nutrition Note  Spoke with Surgery MD and CCM NP. Pt on Cortrak list but plan is to hold off on Cortrak placement at this time as pt has OG tube access that is functioning without issue and no plan to start enteral feeds today. Pt with multiple type 7 bowel movements on 10/04 and multiple type 7 bowel movements today.  Noted pt with acute STEMI overnight and underwent L heart cath and coronary angiography. Pt remains on CRRT and is requiring pressor support.  Discussed pt with Pharmacy. Concentrated TPN to continue at 65 ml/hr to provide 1525 kcal (85% of minimum kcal needs) and 129 grams of protein (100% of protein needs). Unable to meet kcal goals today due to having to remove lipid content (TG 828) and without significantly increasing dextrose content (pt already hyperglycemic).  Abdominal x-ray obtained today shows no definite abnormal bowel dilatation and distal tip of OG tube in proximal stomach. OG tube is currently to LIWS.   RD recommends initiation of trickle tube feeds via OG tube: - Vital 1.5 @ 20 ml/hr (480 ml/day)  Trickle tube feeding regimen would provide 720 kcal, 32 grams of protein, and 367 ml of H2O.   Gustavus Bryant, MS, RD, LDN Inpatient Clinical Dietitian Please see AMiON for contact information.

## 2022-05-02 NOTE — Progress Notes (Signed)
PHARMACY - TOTAL PARENTERAL NUTRITION CONSULT NOTE   Indication: Small bowel obstruction  Patient Measurements: Height: 5' 4" (162.6 cm) Weight: 109.3 kg (241 lb) IBW/kg (Calculated) : 54.7 TPN AdjBW (KG): 68.4 Body mass index is 41.37 kg/m.   Assessment: 51 yo female s/p difficult incisional hernia repair with long lysis of adhesion on 9/26. Who has developed a SBO based on CT scan 10/2.  Poor po intake since surgery  due to abdominal pain.  Pharmacy consulted to start TPN.  Patient intubated on 10/3 due to respiratory distress. Bowel function overnight, plans to hopefully start trickle feeds in the next 24 hours and potentially wean TPN off. Surgery recs holding tube feeds until pressor requirement decreased.   Glucose / Insulin: CBGs 110-234, 17 units of SSI over past 24 hours. SM started 10/2, continues on HC 100 q12h Electrolytes: Na 131, K 4.3 (4k CRRT bags), CO2 17, CoCa 8.3 (s/p 2g), ionized Ca 0.91, phos 4.0, Mg 2.2. Other electrolytes wnl.  Renal: CRRT starting 10/4 - off for cath, resumed ~2300 Hepatic: T bili: 1.7, AST/ALT 700/439. Alk phos wnl. TG 828 (propofol stopped 10/4) Intake / Output; MIVF: UOP 30 ml. CRRT 2L. LBM 10/3 GI Imaging: 10/3 CT ab: continued SBO  10/2 CT Scan - SBO GI Surgeries / Procedures:  10/4: concern of STEMI s/p cath without occlusion  Central access: CVC triple lumen  TPN start date: 10/2  Nutritional Goals: Goal TPN rate is 65 mL/hr (provides 120 g of protein and 2050 kcals per day)  RD Assessment:  Estimated Needs Total Energy Estimated Needs: 1800-2000 kcal/d Total Protein Estimated Needs: 110-130 grams Total Fluid Estimated Needs: 1.8-2L/d  Current Nutrition:  NPO TPN   Plan:  Continue concentrated TPN at 65 ml/hr to provide 129g protein and 1525 kcal - discussed with RD, unable to meet kcal goals with without significantly increasing dextrose content with lipid removal and patient already hyperglycemic. Except will be able to  increase to goal kcal tomorrow Remove lipids, repeat TG tomorrow morning to see about ability to add back  Electrolytes in TPN: Increase Na to 100 mEq/L, increase Ca to 5 mEqL, add back phos at 6 mmol/L. Continue K 0 mEq/L, Mg 0 mEq/L. Max acetate.  Calcium 4g x1  Add standard MVI and trace elements to TPN, chromium removed for CRRT  Increase to resistant SSI as TPN is titrated to goal.  Add 15 units regular insulin to TPN Novolog 4 units q4h x 2 until new TPN Monitor TPN labs on Mon/Thurs, and daily until stabilized   , PharmD, BCPS Clinical Pharmacist 05/02/2022 6:59 AM    

## 2022-05-02 NOTE — Progress Notes (Addendum)
NAME:  Patricia [name], MRN:  [id number], DOB:  1971/03/15, LOS: 8 ADMISSION DATE:  04/23/2022, CONSULTATION DATE:  04/29/2022 REFERRING MD:  Felicie Morn, MD, CHIEF COMPLAINT:   Small bowel Obstruction, aspiration event, Sepsis, Respiratory Failure    History of Present Illness:  Ms. Patricia [name] with above hx and nuc study in 2015 neg for ischemia done for chest pain and echo with EF 60-65% presented to hospital for excisional hernia repair (robotic hernia repair 04/23/22) with mesh, ileus post op on TNA. Developed vomiting and hypotension after NG came out on 04/29/22 and PNA on CXR, was tachycardic and BP improved with IV fluids. DX with sepsis and ABX started. At one point prolonged Qtc. Pt admitted to ICU on 10/2.  Was placed on levophed. Early AM on 10/3 was intubated for worsening metabolic/lactic acidosis. She had AKI as well. Crt was 6.01, her normal 0.9. Today Cr is 4 on CRRT.  Pertinent  Medical History  DM2 Depression OSA Migraines  Asthma Obesity Schizophrenia HTN HLD  Significant Hospital Events: Including procedures, antibiotic start and stop dates in addition to other pertinent events   9/26 Robotic hernia repair complicated by abd adhesions.  10/2 CT + for developing SBO and bilat lower lobe opacities most likely aspiration PNA. Progressive decompensation requiring transfer to ICU 10\3 Intubated for resp failure. CVL and arterial line placed. 04/30/22: EF 30-35%, RV moderately reduced, mild to moderate MR, IVC collapsed throughout respiratory cycle 10\4 EKG changes/trop increased--> and taken to cath lab - No obstructive CAD, but in decompensated heart failure, Bedside echo with EF 15%, new WMA, moderate RV dysfunction, trivial pericardial effusion.underwent emergent L/RHC. Normal coronaries, RA mean 13, PA mean 29, LVEDP 16, PCWP 19, CO/CI 3.96/1.87   returned to CICU post-cath. Right heart cath placed right fem. Right IJ HD cath placed.  10/5 advanced HF team  following, shock supported on norepi/vasopressin and Dobutamine. On CRRT.   Interim History / Subjective:  Awake on vent. No distress.   Objective   Blood pressure 95/60, pulse (!) 118, temperature (!) 96.4 F (35.8 C), resp. rate (!) 21, height '5\' 4"'$  (1.626 m), weight 109.3 kg, last menstrual period 07/07/2014, SpO2 100 %. PAP: (27-40)/(19-32) 27/20 CVP:  [20 mmHg-27 mmHg] 21 mmHg CO:  [3.6 L/min-5.1 L/min] 3.9 L/min CI:  [1.7 L/min/m2-2 L/min/m2] 1.8 L/min/m2  Vent Mode: PRVC FiO2 (%):  [40 %] 40 % Set Rate:  [24 bmp-28 bmp] 24 bmp Vt Set:  [430 mL] 430 mL PEEP:  [0.8 cmH20-8 cmH20] 0.8 cmH20 Plateau Pressure:  [23 cmH20] 23 cmH20   Intake/Output Summary (Last 24 hours) at 05/02/2022 0923 Last data filed at 05/02/2022 0600 Gross per 24 hour  Intake 2785.74 ml  Output 2076 ml  Net 709.74 ml   Filed Weights   04/23/22 0717  Weight: 109.3 kg    Examination: General: 51 year old female patient currently sedated on vent HEENT normocephalic atraumatic orally intubated. Pulmonary: Diminished bases no accessory use Portable chest x-ray personally reviewed: The endotracheal tube is in satisfactory position as it is in the right IJ dialysis catheter.  Orogastric tube noted below the level of the diaphragm, PA catheter from right femoral in the left pulmonary artery.  Some mild basilar loss, but otherwise no clear infiltrates, actually a bit better when comparing film from day prior Cardiac mildly tachycardic rhythm no murmur rub or gallop Abdomen soft, had bowel movement today. G U: Minimal urine output Neuro: Awake, intermittently interactive.  No focal deficits Extremities warm pulses  palpable   Resolved Hospital Problem list   Unable to perform as pt intubated and sedated.   Assessment & Plan:   Circulatory shock. Mixed picture of Septic Shock/MODS/lactic acidosis (2/2 aspiration PNA) and acute decompensated biventricular HF (suspect septic CM given clean coronaries; EF 15% RV  w/mod dysfxn)  Plan  Goal CVP ~10 given RV dysfxn. Seemed to respond to volume, repeat hemodynamic measures. Volume as indicated. Avoid negative balance w/ CRRT Cont to titrate Norepi for MAP > 65, cont shock dose vasopressin  F/u CI; Cards to decide on further inotropic titration  F/u svo2 and lactate Trend PCT Hold antihypertensives Avoid tachycardia Ph goal > 7.2 at least Cont abx currently day 4 of zosyn f/u pending cultures   Acute hypoxic respiratory Failure 2/2 aspiration pneumonia and compensation to metabolic acidosis Plan Cont full vent support; no change in current setting as CRRT should correct VAP bundle  PAD protocol RASS goal 0 to -1  Abx as outline above Am cxr and abg   AKI w/ AGMA 2/2 lactic acidosis all 2/2 shock Baseline scr 0.9 Plan CRRT Serial chems Strict I&O Renal dose meds   Intermittent fluid and electrolyte imbalance Plan Monitor and correct as indicated  Acute metabolic encephalopathy superimposed on h/o bipolar disease and schizophrenia  -likely 2/2 sepsis and renal failure Plan Supportive care PAD protocol as above  S/p hernia repair c/b ileus vs SBO +Bm 10/5 Plan Bowel rest Cont LIWS Once hemodynamics improved consider trickle feed as had bm 10/5 Cont TPN for now   Thrombocytopenia Likely 2/2 sepsis Plan Trend   Hyperglycemia w/ h/o type II DM Plan Ssi w/ q4 hr scheduled as well  Best Practice (right click and "Reselect all SmartList Selections" daily)   Diet/type: TPN DVT prophylaxis: prophylactic heparin  GI prophylaxis: PPI Lines: Central line Foley:  Yes, and it is still needed Code Status:  full code   Critical care time: 45 minutes     Erick Colace ACNP-BC [place] Pager # [phone] OR # [phone] if no answer
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
## 2022-05-02 NOTE — Progress Notes (Signed)
  Echocardiogram 2D Echocardiogram has been performed.  Patricia [name] 05/02/2022, 4:06 PM

## 2022-05-03 ENCOUNTER — Inpatient Hospital Stay (HOSPITAL_COMMUNITY): Payer: Medicaid Other

## 2022-05-03 ENCOUNTER — Encounter (HOSPITAL_COMMUNITY): Payer: Self-pay | Admitting: Internal Medicine

## 2022-05-03 DIAGNOSIS — R6521 Severe sepsis with septic shock: Secondary | ICD-10-CM | POA: Diagnosis not present

## 2022-05-03 DIAGNOSIS — R570 Cardiogenic shock: Secondary | ICD-10-CM | POA: Diagnosis not present

## 2022-05-03 DIAGNOSIS — A419 Sepsis, unspecified organism: Secondary | ICD-10-CM | POA: Diagnosis not present

## 2022-05-03 LAB — CBC
HCT: 29.1 % — ABNORMAL LOW (ref 36.0–46.0)
Hemoglobin: 9.5 g/dL — ABNORMAL LOW (ref 12.0–15.0)
MCH: 27 pg (ref 26.0–34.0)
MCHC: 32.6 g/dL (ref 30.0–36.0)
MCV: 82.7 fL (ref 80.0–100.0)
Platelets: 38 10*3/uL — ABNORMAL LOW (ref 150–400)
RBC: 3.52 MIL/uL — ABNORMAL LOW (ref 3.87–5.11)
RDW: 15.1 % (ref 11.5–15.5)
WBC: 19 10*3/uL — ABNORMAL HIGH (ref 4.0–10.5)
nRBC: 3.1 % — ABNORMAL HIGH (ref 0.0–0.2)

## 2022-05-03 LAB — RENAL FUNCTION PANEL
Albumin: 1.6 g/dL — ABNORMAL LOW (ref 3.5–5.0)
Albumin: 1.5 g/dL — ABNORMAL LOW (ref 3.5–5.0)
Anion gap: 10 (ref 5–15)
Anion gap: 10 (ref 5–15)
BUN: 37 mg/dL — ABNORMAL HIGH (ref 6–20)
BUN: 29 mg/dL — ABNORMAL HIGH (ref 6–20)
CO2: 24 mmol/L (ref 22–32)
CO2: 23 mmol/L (ref 22–32)
Calcium: 7.3 mg/dL — ABNORMAL LOW (ref 8.9–10.3)
Calcium: 6.7 mg/dL — ABNORMAL LOW (ref 8.9–10.3)
Chloride: 101 mmol/L (ref 98–111)
Chloride: 103 mmol/L (ref 98–111)
Creatinine, Ser: 2.55 mg/dL — ABNORMAL HIGH (ref 0.44–1.00)
Creatinine, Ser: 2.25 mg/dL — ABNORMAL HIGH (ref 0.44–1.00)
GFR, Estimated: 22 mL/min — ABNORMAL LOW (ref >60)
GFR, Estimated: 26 mL/min — ABNORMAL LOW (ref >60)
Glucose, Bld: 197 mg/dL — ABNORMAL HIGH (ref 70–99)
Glucose, Bld: 228 mg/dL — ABNORMAL HIGH (ref 70–99)
Phosphorus: 1.7 mg/dL — ABNORMAL LOW (ref 2.5–4.6)
Phosphorus: 3.3 mg/dL (ref 2.5–4.6)
Potassium: 4 mmol/L (ref 3.5–5.1)
Potassium: 3.7 mmol/L (ref 3.5–5.1)
Sodium: 135 mmol/L (ref 135–145)
Sodium: 136 mmol/L (ref 135–145)

## 2022-05-03 LAB — COOXEMETRY PANEL
Carboxyhemoglobin: 1 % (ref 0.5–1.5)
Carboxyhemoglobin: 0.9 % (ref 0.5–1.5)
Carboxyhemoglobin: 0.5 % (ref 0.5–1.5)
Carboxyhemoglobin: 1.3 % (ref 0.5–1.5)
Methemoglobin: <0.7 % (ref 0.0–1.5)
Methemoglobin: <0.7 % (ref 0.0–1.5)
Methemoglobin: <0.7 % (ref 0.0–1.5)
Methemoglobin: <0.7 % (ref 0.0–1.5)
O2 Saturation: 58.3 %
O2 Saturation: 64.4 %
O2 Saturation: 54.6 %
O2 Saturation: 53.4 %
Total hemoglobin: 9.2 g/dL — ABNORMAL LOW (ref 12.0–16.0)
Total hemoglobin: 10.3 g/dL — ABNORMAL LOW (ref 12.0–16.0)
Total hemoglobin: 10.5 g/dL — ABNORMAL LOW (ref 12.0–16.0)
Total hemoglobin: 7 g/dL — ABNORMAL LOW (ref 12.0–16.0)

## 2022-05-03 LAB — POCT I-STAT EG7
Acid-base deficit: 2 mmol/L (ref 0.0–2.0)
Bicarbonate: 24 mmol/L (ref 20.0–28.0)
Calcium, Ion: 1 mmol/L — ABNORMAL LOW (ref 1.15–1.40)
HCT: 29 % — ABNORMAL LOW (ref 36.0–46.0)
Hemoglobin: 9.9 g/dL — ABNORMAL LOW (ref 12.0–15.0)
O2 Saturation: 44 %
Patient temperature: 36.2
Potassium: 3.5 mmol/L (ref 3.5–5.1)
Sodium: 138 mmol/L (ref 135–145)
TCO2: 25 mmol/L (ref 22–32)
pCO2, Ven: 44.3 mmHg (ref 44–60)
pH, Ven: 7.339 (ref 7.25–7.43)
pO2, Ven: 25 mmHg — CL (ref 32–45)

## 2022-05-03 LAB — ECHOCARDIOGRAM LIMITED
Calc EF: 16.7 %
Height: 64 in
S' Lateral: 4.25 cm
Single Plane A2C EF: 6.1 %
Single Plane A4C EF: 19.9 %
Weight: 3856 oz

## 2022-05-03 LAB — GLUCOSE, CAPILLARY
Glucose-Capillary: 188 mg/dL — ABNORMAL HIGH (ref 70–99)
Glucose-Capillary: 191 mg/dL — ABNORMAL HIGH (ref 70–99)
Glucose-Capillary: 193 mg/dL — ABNORMAL HIGH (ref 70–99)
Glucose-Capillary: 194 mg/dL — ABNORMAL HIGH (ref 70–99)
Glucose-Capillary: 201 mg/dL — ABNORMAL HIGH (ref 70–99)
Glucose-Capillary: 204 mg/dL — ABNORMAL HIGH (ref 70–99)
Glucose-Capillary: 225 mg/dL — ABNORMAL HIGH (ref 70–99)

## 2022-05-03 LAB — LACTIC ACID, PLASMA
Lactic Acid, Venous: 2 mmol/L (ref 0.5–1.9)
Lactic Acid, Venous: 2 mmol/L (ref 0.5–1.9)
Lactic Acid, Venous: 1.9 mmol/L (ref 0.5–1.9)

## 2022-05-03 LAB — MRSA NEXT GEN BY PCR, NASAL: MRSA by PCR Next Gen: NOT DETECTED

## 2022-05-03 LAB — MAGNESIUM: Magnesium: 2.2 mg/dL (ref 1.7–2.4)

## 2022-05-03 LAB — TRIGLYCERIDES: Triglycerides: 333 mg/dL — ABNORMAL HIGH (ref <150)

## 2022-05-03 MED ORDER — K PHOS MONO-SOD PHOS DI & MONO 155-852-130 MG PO TABS
500.0000 mg | ORAL_TABLET | Freq: Two times a day (BID) | ORAL | Status: AC
  Administered 2022-05-03 – 2022-05-06 (×8): 500 mg
  Filled 2022-05-03 (×9): qty 2

## 2022-05-03 MED ORDER — INSULIN ASPART 100 UNIT/ML IJ SOLN: 4.0000 [IU] | INTRAMUSCULAR | Status: DC

## 2022-05-03 MED ORDER — INSULIN ASPART 100 UNIT/ML IJ SOLN
4.0000 [IU] | INTRAMUSCULAR | Status: DC
  Administered 2022-05-03 – 2022-05-04 (×7): 4 [IU] via SUBCUTANEOUS

## 2022-05-03 MED ORDER — VANCOMYCIN HCL IN DEXTROSE 1-5 GM/200ML-% IV SOLN
1000.0000 mg | INTRAVENOUS | Status: DC
  Administered 2022-05-04 – 2022-05-07 (×4): 1000 mg via INTRAVENOUS
  Filled 2022-05-03 (×4): qty 200

## 2022-05-03 MED ORDER — TRACE MINERALS CU-MN-SE-ZN 300-55-60-3000 MCG/ML IV SOLN
INTRAVENOUS | Status: AC
  Filled 2022-05-03: qty 828.8

## 2022-05-03 MED ORDER — VANCOMYCIN HCL 2000 MG/400ML IV SOLN
2000.0000 mg | Freq: Once | INTRAVENOUS | Status: AC
  Administered 2022-05-03: 2000 mg via INTRAVENOUS
  Filled 2022-05-03: qty 400

## 2022-05-03 MED ORDER — SODIUM PHOSPHATES 45 MMOLE/15ML IV SOLN
30.0000 mmol | Freq: Once | INTRAVENOUS | Status: AC
  Administered 2022-05-03: 30 mmol via INTRAVENOUS
  Filled 2022-05-03: qty 10

## 2022-05-03 MED ORDER — MIDAZOLAM HCL 2 MG/2ML IJ SOLN
2.0000 mg | INTRAMUSCULAR | Status: DC | PRN
  Administered 2022-05-03 – 2022-05-04 (×10): 2 mg via INTRAVENOUS
  Filled 2022-05-03 (×10): qty 2

## 2022-05-03 NOTE — Progress Notes (Signed)
eLink Physician-Brief Progress Note Patient Name: Patricia [name] DOB: Apr 28, 1971 MRN: [id number]   Date of Service  05/03/2022  HPI/Events of Note  Agitation  Ventilator Asynchrony = Patient is already on a Fentanyl IV infusion + Fentanyl IV boluses. Nursing request for PRN Versed.   eICU Interventions  Plan: Versed 2 mg IV Q 1 hour PRN agitation, sedation or ventilator asynchrony.      Intervention Category Major Interventions: Delirium, psychosis, severe agitation - evaluation and management  Patricia [name] 05/03/2022, 5:20 AM

## 2022-05-03 NOTE — Progress Notes (Signed)
RT called to bedside due to pt ETT at 19 cm @ lip. RT advanced ETT back to 23 @ lip, bilateral breath sounds pre and post tube advancement as well as stable vitals and tidal volume. RT will cont to monitor as needed.

## 2022-05-03 NOTE — Progress Notes (Signed)
Patient ID: Patricia [name], female   DOB: Jan 05, 1971, 51 y.o.   MRN: [id number] [facility] Progress Note   Assessment/ Plan:   1.  Acute kidney injury: This appears to be likely from ATN associated with septic shock/sepsis.  Remains on pressor support/hemodynamically unstable we will continue CRRT at the current prescription to keep her net even based on CVP/hemodynamic parameters. 2.  Anion gap metabolic acidosis: This appears to be largely from lactic acidosis associated with septic shock as well as acute kidney injury.  Correcting slowly with CRRT. 3.  Septic shock now with superimposed cardiogenic shock: Likely related to aspiration pneumonia versus acute abdominal event.  On inotropic support with dobutamine and antimicrobial coverage with vancomycin/Zosyn. 4.  Acute hypoxic respiratory failure: Secondary to septic shock/aspiration pneumonia event.  Ventilator management per CCM. 5.  Status post robotic incisional hernia repair with mesh/lysis of adhesions: Continue monitoring and management per surgical service.  Subjective:   No acute events overnight, on dobutamine for cardiogenic shock and running net even on CRRT based on CVP/hemodynamic parameters.   Objective:   BP 108/73   Pulse 83   Temp (!) 96.8 F (36 C)   Resp (!) 24   Ht '5\' 4"'$  (1.626 m)   Wt 112.2 kg   LMP 07/07/2014 (Approximate)   SpO2 100%   BMI 42.46 kg/m   Intake/Output Summary (Last 24 hours) at 05/03/2022 0807 Last data filed at 05/03/2022 0700 Gross per 24 hour  Intake 4613.71 ml  Output 4937 ml  Net -323.29 ml   Weight change:   Physical Exam: Gen: Intubated, eyes open/appears awake but without response to verbal commands.  On CRRT. CVS: Pulse regular, normal rate, S1 and S2 normal Resp: Anteriorly clear to auscultation, no distinct rales or rhonchi Abd: Soft, moderately distended, no bowel sounds audible.  Intact laparoscopic surgery incision sites Ext: Extremities cool to touch, no  ankle edema  Imaging: ECHOCARDIOGRAM LIMITED  Result Date: 05/03/2022    ECHOCARDIOGRAM LIMITED REPORT   Patient Name:   Patricia [name] Date of Exam: 05/02/2022 Medical Rec #:  [id number]          Height:       64.0 in Accession #:    [id number]         Weight:       241.0 lb Date of Birth:  1970-11-29          BSA:          2.118 m Patient Age:    51 years           BP:           117/79 mmHg Patient Gender: F                  HR:           114 bpm. Exam Location:  Inpatient Procedure: Limited Echo, Cardiac Doppler, Color Doppler and Intracardiac            Opacification Agent Indications:    I50.40* Unspecified combined systolic (congestive) and diastolic                 (congestive) heart failure  History:        Patient has prior history of Echocardiogram examinations, most                 recent 04/30/2022. Abnormal ECG, Arrythmias:Tachycardia,  Signs/Symptoms:Bacteremia and Altered Mental Status; Risk                 Factors:Hypertension and Diabetes.  Sonographer:    Roseanna Rainbow RDCS Referring Phys: Marlyce Huge, NICOLE  Sonographer Comments: Technically difficult study due to poor echo windows, patient is obese and echo performed with patient supine and on artificial respirator. Image acquisition challenging due to patient body habitus and Image acquisition challenging due to respiratory motion. IMPRESSIONS  1. Left ventricular ejection fraction, by estimation, is 20%. The left ventricle has severely decreased function. The left ventricle demonstrates global hypokinesis.  2. Right ventricular systolic function is moderately reduced. The right ventricular size is normal. Tricuspid regurgitation signal is inadequate for assessing PA pressure.  3. The mitral valve is grossly normal. Mild mitral valve regurgitation.  4. The aortic valve is grossly normal.  5. There is borderline dilatation of the ascending aorta, measuring 38 mm. FINDINGS  Left Ventricle: Left ventricular ejection fraction,  by estimation, is 20%. The left ventricle has severely decreased function. The left ventricle demonstrates global hypokinesis. Definity contrast agent was given IV to delineate the left ventricular endocardial borders. There is borderline left ventricular hypertrophy. Right Ventricle: The right ventricular size is normal. Right ventricular systolic function is moderately reduced. Tricuspid regurgitation signal is inadequate for assessing PA pressure. Pericardium: Presence of epicardial fat layer. Mitral Valve: The mitral valve is grossly normal. Mild mitral valve regurgitation. Aortic Valve: The aortic valve is grossly normal. Aorta: The aortic root is normal in size and structure. There is borderline dilatation of the ascending aorta, measuring 38 mm. LEFT VENTRICLE PLAX 2D LVIDd:         4.85 cm LVIDs:         4.25 cm LV PW:         0.95 cm LV IVS:        1.05 cm  LV Volumes (MOD) LV vol d, MOD A2C: 52.1 ml LV vol d, MOD A4C: 108.0 ml LV vol s, MOD A2C: 48.9 ml LV vol s, MOD A4C: 86.5 ml LV SV MOD A2C:     3.2 ml LV SV MOD A4C:     108.0 ml LV SV MOD BP:      13.2 ml LEFT ATRIUM         Index LA diam:    3.40 cm 1.61 cm/m   AORTA Ao Root diam: 3.30 cm Ao Asc diam:  3.80 cm Cherlynn Kaiser MD Electronically signed by Cherlynn Kaiser MD Signature Date/Time: 05/03/2022/7:44:53 AM    Final    DG CHEST PORT 1 VIEW  Result Date: 05/03/2022 CLINICAL DATA:  Septic shock.  Ventilator dependence. EXAM: PORTABLE CHEST 1 VIEW COMPARISON:  05/02/2022 FINDINGS: 0552 hours. Endotracheal tube tip is 3.2 cm above the base of the carina. Right IJ catheter tip overlies the mid to distal SVC level. Right-sided PICC line tip overlies the region of the right atrium. Inferiorly placed pulmonary artery catheter tip is in the left lower lobe pulmonary artery The NG tube passes into the stomach although the distal tip position is not included on the film. Left base atelectasis again noted. No evidence for pneumothorax or substantial  pleural effusion. The cardio pericardial silhouette is enlarged. IMPRESSION: 1. Stable exam. Left base atelectasis without new or progressive interval findings. Electronically Signed   By: Misty Stanley M.D.   On: 05/03/2022 06:14   DG Abd 1 View  Result Date: 05/02/2022 CLINICAL DATA:  Ileus. EXAM: ABDOMEN - 1  VIEW COMPARISON:  April 30, 2022. FINDINGS: Limited exam due to body habitus. No definite abnormal bowel dilatation is noted. Distal tip of nasogastric tube is seen in proximal stomach. IMPRESSION: Limited exam due to body habitus. No definite evidence of abnormal bowel dilatation. Electronically Signed   By: Marijo Conception M.D.   On: 05/02/2022 14:54   DG CHEST PORT 1 VIEW  Result Date: 05/02/2022 CLINICAL DATA:  CHF, placement of Swan-Ganz catheter EXAM: PORTABLE CHEST 1 VIEW COMPARISON:  Previous studies including the examination of 05/01/2022 FINDINGS: Tip of endotracheal tube is 3.9 cm above the carina. There is interval removal of left IJ central venous catheter. Right IJ central venous catheter is seen with its tip in superior vena cava. Tip of right PICC line is seen in the region of right atrium. Enteric tube is noted traversing the esophagus. There is interval placement of Swan-Ganz catheter through the lower extremity with its tip in the region of left lower lobe pulmonary artery. There are linear densities in left lower lung field. There are no signs of alveolar pulmonary edema or new focal infiltrates. There is no pleural effusion or pneumothorax. IMPRESSION: Tip of Swan-Ganz catheter is seen in the course of left lower lobe pulmonary artery. There are linear densities in left lower lung fields suggesting subsegmental atelectasis. Other support devices as described in the body of the report. Electronically Signed   By: Elmer Picker M.D.   On: 05/02/2022 08:53   CARDIAC CATHETERIZATION  Result Date: 05/01/2022 1.  Normal right dominant circulation without of thrombotic  occlusion, spasm, or dissection. 2.  Cardiogenic shock with a cardiac output of 3.96 L/min and a cardiac index of 1.87 L/min/m.  The mean RA pressure was 13 mmHg, the mean wedge pressure was 19 mmHg, and the mean PA pressure was 29 mmHg; the PAPI is 0.7.  LVEDP is 16 mmHg.  The MAP at this time was 100 mmHg with the patient on both norepinephrine and vasopressin.  Dobutamine 5 mcg/kg/min was initiated, pressors were stopped and MAP goal of around 70 to 75 mmHg will be pursued.  This was discussed with pulmonary critical care. Recommendations: Continue dobutamine with map goal of 70 to 75 mmHg and resume CRRT for volume removal and management of acid-base status.   DG CHEST PORT 1 VIEW  Result Date: 05/01/2022 CLINICAL DATA:  Central line placement. EXAM: PORTABLE CHEST 1 VIEW COMPARISON:  One-view chest x-ray 04/30/2022. FINDINGS: Endotracheal tube is stable, 3.7 cm above the carina. Side port of the NG tube is in the fundus the stomach. A left IJ line is stable, terminating at the cavoatrial junction. Right-sided PICC line and extends just beyond the left IJ line. A right IJ line is new, terminating above the cavoatrial junction. No pneumothorax is present. Heart size is exaggerate by low lung volumes. Left greater than right basilar airspace disease is stable. IMPRESSION: 1. Interval placement of right IJ line without radiographic evidence for complication. 2. Stable left IJ line. 3. Stable left greater than right basilar airspace disease. 4. Stable endotracheal tube, 3.7 cm above the carina. 5. Low lung volumes with persistent left lower lobe airspace disease concerning for pneumonia or aspiration. Electronically Signed   By: San Morelle M.D.   On: 05/01/2022 11:51    Labs: [employer] Recent Labs  Lab 04/29/22 0926 04/29/22 1642 04/30/22 0055 04/30/22 0144 04/30/22 1301 04/30/22 2108 05/01/22 0416 05/01/22 1600 05/01/22 2119 05/01/22 2138 05/01/22 2142 05/01/22 2325 05/02/22 0409  05/02/22 0410 05/02/22  1531 05/03/22 0411  NA  --    < > 142   < > 139   < > 133*  136 134*   < > 140 133* 133* 134* 131* 136 135  K  --    < > 4.2   < > 4.7   < > 3.4*  3.5 3.5   < > 2.9* 3.6 3.4* 4.3 4.3 4.2 4.0  CL  --   --  109  --  102  --  97*  97* 99  --   --   --  101  --  99 101 101  CO2  --   --  11*  --  18*  --  15*  16* 16*  --   --   --  15*  --  17* 19* 24  GLUCOSE  --   --  132*  --  189*  --  190*  191* 192*  --   --   --  240*  --  251* 234* 197*  BUN  --   --  45*  --  54*  --  71*  69* 60*  --   --   --  62*  --  51* 43* 37*  CREATININE  --   --  4.68*  --  4.70*  --  6.01*  6.13* 4.96*  --   --   --  4.93*  --  4.02* 3.01* 2.55*  CALCIUM  --   --  7.4*  --  6.0*  --  6.6*  6.6* 6.4*  --   --   --  5.5*  --  6.5* 7.7* 7.3*  PHOS 2.2*  --  3.2  --   --   --  6.3* 5.5*  --   --   --   --   --  4.0 2.5 1.7*   < > = values in this interval not displayed.   CBC Recent Labs  Lab 04/30/22 0037 04/30/22 0144 05/01/22 0416 05/01/22 2119 05/01/22 2325 05/02/22 0409 05/02/22 0410 05/03/22 0411  WBC 7.7  --  10.0  --  6.9  --  7.9 19.0*  NEUTROABS 5.8  --   --   --   --   --   --   --   HGB 11.3*   < > 11.4*   < > 10.3* 11.6* 11.1* 9.5*  HCT 35.6*   < > 34.4*   < > 32.1* 34.0* 33.8* 29.1*  MCV 84.6  --  82.3  --  86.1  --  84.1 82.7  PLT 437*  --  129*  --  61*  --  57* 38*   < > = values in this interval not displayed.    Medications:     Chlorhexidine Gluconate Cloth  6 each Topical Daily   docusate  100 mg Per Tube BID   hydrocortisone sod succinate (SOLU-CORTEF) inj  100 mg Intravenous Q12H   insulin aspart  0-20 Units Subcutaneous Q4H   mouth rinse  15 mL Mouth Rinse Q2H   pantoprazole (PROTONIX) IV  40 mg Intravenous Q24H   polyethylene glycol  17 g Per Tube Daily   sodium chloride flush  10-40 mL Intracatheter Q12H   sodium chloride flush  3 mL Intravenous Q12H   Elmarie Shiley, MD 05/03/2022, 8:07 AM

## 2022-05-03 NOTE — Progress Notes (Addendum)
Advanced Heart Failure Rounding Note  PCP-Cardiologist: None   Subjective:   10/3- Echo 30-35% RV moderately reduced. 10/4- normal cors, CO 3.96, CI 1.9, RA 13, PCWP 19, PAPi 0.7  10/5 - KUB- no bowel dilatation.   -Vaso 0.03, DBA 5 mcg, Norepi 5 mcg   -On CRRT - running even due to allow CVP run 9-11   WBC 7>19  Plts 57>38. On subcutaneous heparin.   CO-OX 58% PAP: (21-45)/(14-31) 28/17 CVP:  [5 mmHg-32 mmHg] 8 mmHg CO:  [4.4 L/min-4.9 L/min] 4.4 L/min CI:  [2.1 L/min/m2-2.3 L/min/m2] 2.1 L/min/m2  Intubated. Follows commands and MAE.     Objective:   Weight Range: 112.2 kg Body mass index is 42.46 kg/m.   Vital Signs:   Temp:  [96.3 F (35.7 C)-98.2 F (36.8 C)] 96.8 F (36 C) (10/06 0700) Pulse Rate:  [82-120] 83 (10/06 0700) Resp:  [0-34] 24 (10/06 0700) BP: (87-117)/(61-79) 108/73 (10/06 0327) SpO2:  [95 %-100 %] 100 % (10/06 0700) Arterial Line BP: (82-131)/(56-92) 113/77 (10/06 0700) FiO2 (%):  [40 %] 40 % (10/06 0400) Weight:  [112.2 kg] 112.2 kg (10/06 0332) Last BM Date : 05/01/22 (FMS present)  Weight change: Filed Weights   04/23/22 0717 05/03/22 0332  Weight: 109.3 kg 112.2 kg    Intake/Output:   Intake/Output Summary (Last 24 hours) at 05/03/2022 0714 Last data filed at 05/03/2022 0700 Gross per 24 hour  Intake 4861.66 ml  Output 5135 ml  Net -273.34 ml      Physical Exam   CVP 8-9  General: Intubated HEENT: ETT  Neck: Supple. JVP 8-9  . Carotids 2+ bilat; no bruits. No lymphadenopathy or thyromegaly appreciated. RIJ  Cor: PMI nondisplaced. Regular rate & rhythm. No rubs, gallops or murmurs. Lungs: Clear Abdomen: No BS.  No hepatosplenomegaly. No bruits or masses. Flexi seal  Extremities: No cyanosis, clubbing, rash, R and LLE SCDs. RUE PICC. R femoral swan Neuro: Intubated. MAE x4. Follows commands.    Telemetry   SR 80s personally checked.   EKG    N/A  Labs    CBC Recent Labs    05/02/22 0410 05/03/22 0411   WBC 7.9 19.0*  HGB 11.1* 9.5*  HCT 33.8* 29.1*  MCV 84.1 82.7  PLT 57* 38*   Basic Metabolic Panel Recent Labs    05/02/22 0410 05/02/22 1531 05/03/22 0411  NA 131* 136 135  K 4.3 4.2 4.0  CL 99 101 101  CO2 17* 19* 24  GLUCOSE 251* 234* 197*  BUN 51* 43* 37*  CREATININE 4.02* 3.01* 2.55*  CALCIUM 6.5* 7.7* 7.3*  MG 2.2  --  2.2  PHOS 4.0 2.5 1.7*   Liver Function Tests Recent Labs    05/01/22 0416 05/01/22 1600 05/01/22 2325 05/02/22 0410 05/02/22 1531 05/03/22 0411  AST 1,115*  --  700*  --   --   --   ALT 632*  --  439*  --   --   --   ALKPHOS 62  --  125  --   --   --   BILITOT 1.8*  --  1.7*  --   --   --   PROT 5.0*  --  4.5*  --   --   --   ALBUMIN 2.3*   < > 1.6*   < > 1.7* 1.6*   < > = values in this interval not displayed.   No results for input(s): "LIPASE", "AMYLASE" in the last 72 hours.  Cardiac Enzymes No results for input(s): "CKTOTAL", "CKMB", "CKMBINDEX", "TROPONINI" in the last 72 hours.  BNP: BNP (last 3 results) No results for input(s): "BNP" in the last 8760 hours.  ProBNP (last 3 results) No results for input(s): "PROBNP" in the last 8760 hours.   D-Dimer No results for input(s): "DDIMER" in the last 72 hours. Hemoglobin A1C No results for input(s): "HGBA1C" in the last 72 hours. Fasting Lipid Panel Recent Labs    05/03/22 0411  TRIG 333*   Thyroid Function Tests No results for input(s): "TSH", "T4TOTAL", "T3FREE", "THYROIDAB" in the last 72 hours.  Invalid input(s): "FREET3"  Other results:   Imaging    DG CHEST PORT 1 VIEW  Result Date: 05/03/2022 CLINICAL DATA:  Septic shock.  Ventilator dependence. EXAM: PORTABLE CHEST 1 VIEW COMPARISON:  05/02/2022 FINDINGS: 0552 hours. Endotracheal tube tip is 3.2 cm above the base of the carina. Right IJ catheter tip overlies the mid to distal SVC level. Right-sided PICC line tip overlies the region of the right atrium. Inferiorly placed pulmonary artery catheter tip is in the  left lower lobe pulmonary artery The NG tube passes into the stomach although the distal tip position is not included on the film. Left base atelectasis again noted. No evidence for pneumothorax or substantial pleural effusion. The cardio pericardial silhouette is enlarged. IMPRESSION: 1. Stable exam. Left base atelectasis without new or progressive interval findings. Electronically Signed   By: Misty Stanley M.D.   On: 05/03/2022 06:14   DG Abd 1 View  Result Date: 05/02/2022 CLINICAL DATA:  Ileus. EXAM: ABDOMEN - 1 VIEW COMPARISON:  April 30, 2022. FINDINGS: Limited exam due to body habitus. No definite abnormal bowel dilatation is noted. Distal tip of nasogastric tube is seen in proximal stomach. IMPRESSION: Limited exam due to body habitus. No definite evidence of abnormal bowel dilatation. Electronically Signed   By: Marijo Conception M.D.   On: 05/02/2022 14:54   DG CHEST PORT 1 VIEW  Result Date: 05/02/2022 CLINICAL DATA:  CHF, placement of Swan-Ganz catheter EXAM: PORTABLE CHEST 1 VIEW COMPARISON:  Previous studies including the examination of 05/01/2022 FINDINGS: Tip of endotracheal tube is 3.9 cm above the carina. There is interval removal of left IJ central venous catheter. Right IJ central venous catheter is seen with its tip in superior vena cava. Tip of right PICC line is seen in the region of right atrium. Enteric tube is noted traversing the esophagus. There is interval placement of Swan-Ganz catheter through the lower extremity with its tip in the region of left lower lobe pulmonary artery. There are linear densities in left lower lung field. There are no signs of alveolar pulmonary edema or new focal infiltrates. There is no pleural effusion or pneumothorax. IMPRESSION: Tip of Swan-Ganz catheter is seen in the course of left lower lobe pulmonary artery. There are linear densities in left lower lung fields suggesting subsegmental atelectasis. Other support devices as described in the body of  the report. Electronically Signed   By: Elmer Picker M.D.   On: 05/02/2022 08:53     Medications:     Scheduled Medications:  Chlorhexidine Gluconate Cloth  6 each Topical Daily   docusate  100 mg Per Tube BID   heparin  5,000 Units Subcutaneous Q8H   hydrocortisone sod succinate (SOLU-CORTEF) inj  100 mg Intravenous Q12H   insulin aspart  0-20 Units Subcutaneous Q4H   mouth rinse  15 mL Mouth Rinse Q2H   pantoprazole (PROTONIX) IV  40 mg Intravenous Q24H   polyethylene glycol  17 g Per Tube Daily   sodium chloride flush  10-40 mL Intracatheter Q12H   sodium chloride flush  3 mL Intravenous Q12H    Infusions:   prismasol BGK 4/2.5 400 mL/hr at 05/03/22 0044    prismasol BGK 4/2.5 400 mL/hr at 05/03/22 0044   sodium chloride     sodium chloride 10 mL/hr at 05/03/22 0700   sodium chloride     sodium chloride 10 mL/hr at 05/02/22 1709   sodium chloride     dexmedetomidine (PRECEDEX) IV infusion 1.2 mcg/kg/hr (05/03/22 0700)   DOBUTamine 5 mcg/kg/min (05/03/22 0700)   fentaNYL infusion INTRAVENOUS 200 mcg/hr (05/03/22 0700)   methocarbamol (ROBAXIN) IV Stopped (04/29/22 1354)   norepinephrine (LEVOPHED) Adult infusion 5 mcg/min (05/03/22 0700)   piperacillin-tazobactam Stopped (05/03/22 0437)   prismasol BGK 4/2.5 1,500 mL/hr at 05/03/22 0534   TPN ADULT (ION) 65 mL/hr at 05/03/22 0700   vasopressin 0.03 Units/min (05/03/22 0700)    PRN Medications: sodium chloride, sodium chloride, acetaminophen, alum & mag hydroxide-simeth, diphenhydrAMINE, fentaNYL, fentaNYL (SUBLIMAZE) injection, fentaNYL (SUBLIMAZE) injection, heparin, HYDROmorphone (DILAUDID) injection, hydrOXYzine, methocarbamol (ROBAXIN) IV, midazolam, ondansetron (ZOFRAN) IV, mouth rinse, prochlorperazine, simethicone, sodium chloride, sodium chloride flush, sodium chloride flush    Patient Profile  51 y.o. AAF with history of bipolar disorder/schizophrenia, DM II, obesity, prior abdominal surgeries. Underwent  robotic incisional hernia repair on 09/26.    Course c/b post-op ilieus/obstruction followed by septic shock 2/2 aspiration PNA, AKI on CRRT and cardiogenic shock.   Assessment/Plan  Shock, primarily septic with component of cardiogenic -Septic shock 2/2 aspiration PNA. On Zosyn. Procal > 150 - WBC up to 7>19  -Blood cultures no growth.  -Taken emergently to cath lab 10/4 d/t concern for ST elevation MI. HS troponin > 24K. Coronaries clean. CI 1.87, RA mean 13, PCWP 19, LVEDP 16.  - Remains on Norep 5, DBA 5, and Vaso 0.03 . CO-OX 58%.  - Titrate for NE for MAP > 70 to promote renal recovery -Suspect likely stress cardiomyopathy from septic shock   2. Acute systolic CHF -No prior cardiac history -Echo 10/03: EF 30-35%, RV moderately reduced -Bedside echo 10/04 at time of ECG changes: EF 15% -Echo at bedside 10/5:  EF appears to be 35-40%  -Will repeat echo to reassess LV/RV function once she has been maintained on 5 DBA -Inotrope support as above   3. Acute MI -ST elevation in inferolateral leads on ECG  -HS troponin > 24K -Cath with normal coronaries -? Sepsis cardiomyopathy   4. Metabolic acidosis: -CO2 60>45, lactic acid was down to 2.8 on last check.  -Now on CRRT   5. AKI: -Likely ATN in setting of septic shock -Continue CRRT. Nephrology following -Hemodynamic support as above   6. Acute hypoxic respiratory failure: -2/2 aspiration PNA -Vent management per CCM -On Zosyn   7. Incisional hernia: -s/p robotic repair with mesh/lysis of adhesions this admit   8. SBO -Post-op -Receiving TPN  9. Thrombocytopenia  Platelets down to 38. Check HIT - Hold subcutaneous heparin - SCDs      Length of Stay: [place], NP  05/03/2022, 7:14 AM  Advanced Heart Failure Team Pager [phone] (M-F; 7a - 5p)  Please contact [facility] for night-coverage after hours (5p -7a ) and weekends on [email or web address]

## 2022-05-03 NOTE — Progress Notes (Signed)
2 Days Post-Op   Subjective/Chief Complaint: No acute changes   Objective: Vital signs in last 24 hours: Temp:  [96.6 F (35.9 C)-98.2 F (36.8 C)] 96.8 F (36 C) (10/06 0818) Pulse Rate:  [82-119] 90 (10/06 0818) Resp:  [7-34] 24 (10/06 0818) BP: (98-117)/(65-79) 110/75 (10/06 0818) SpO2:  [95 %-100 %] 100 % (10/06 0818) Arterial Line BP: (87-131)/(57-92) 116/78 (10/06 0800) FiO2 (%):  [40 %] 40 % (10/06 0818) Weight:  [112.2 kg] 112.2 kg (10/06 0332) Last BM Date : 05/03/22  Intake/Output from previous day: 10/05 0701 - 10/06 0700 In: 4861.7 [I.V.:3951.7; NG/GT:70; IV Piggyback:840] Out: 9371 [Urine:15; Stool:645] Intake/Output this shift: Total I/O In: 149.5 [I.V.:149.5] Out: 183 [Urine:23]  General awakens on vent Abd obese soft incisions without infection  Lab Results:  Recent Labs    05/02/22 0410 05/03/22 0411  WBC 7.9 19.0*  HGB 11.1* 9.5*  HCT 33.8* 29.1*  PLT 57* 38*   BMET Recent Labs    05/02/22 1531 05/03/22 0411  NA 136 135  K 4.2 4.0  CL 101 101  CO2 19* 24  GLUCOSE 234* 197*  BUN 43* 37*  CREATININE 3.01* 2.55*  CALCIUM 7.7* 7.3*   PT/INR No results for input(s): "LABPROT", "INR" in the last 72 hours. ABG Recent Labs    05/01/22 2119 05/01/22 2138 05/01/22 2142 05/02/22 0409  PHART 7.246*  --   --  7.284*  HCO3 18.7*   < > 19.9* 18.4*   < > = values in this interval not displayed.    Studies/Results: ECHOCARDIOGRAM LIMITED  Result Date: 05/03/2022    ECHOCARDIOGRAM LIMITED REPORT   Patient Name:   Patricia [name] Date of Exam: 05/02/2022 Medical Rec #:  [id number]          Height:       64.0 in Accession #:    [id number]         Weight:       241.0 lb Date of Birth:  05-Aug-1970          BSA:          2.118 m Patient Age:    51 years           BP:           117/79 mmHg Patient Gender: F                  HR:           114 bpm. Exam Location:  Inpatient Procedure: Limited Echo, Cardiac Doppler, Color Doppler and Intracardiac             Opacification Agent Indications:    I50.40* Unspecified combined systolic (congestive) and diastolic                 (congestive) heart failure  History:        Patient has prior history of Echocardiogram examinations, most                 recent 04/30/2022. Abnormal ECG, Arrythmias:Tachycardia,                 Signs/Symptoms:Bacteremia and Altered Mental Status; Risk                 Factors:Hypertension and Diabetes.  Sonographer:    Roseanna Rainbow RDCS Referring Phys: Marlyce Huge, NICOLE  Sonographer Comments: Technically difficult study due to poor echo windows, patient is obese and echo performed with patient supine and on artificial respirator.  Image acquisition challenging due to patient body habitus and Image acquisition challenging due to respiratory motion. IMPRESSIONS  1. Left ventricular ejection fraction, by estimation, is 20%. The left ventricle has severely decreased function. The left ventricle demonstrates global hypokinesis.  2. Right ventricular systolic function is moderately reduced. The right ventricular size is normal. Tricuspid regurgitation signal is inadequate for assessing PA pressure.  3. The mitral valve is grossly normal. Mild mitral valve regurgitation.  4. The aortic valve is grossly normal.  5. There is borderline dilatation of the ascending aorta, measuring 38 mm. FINDINGS  Left Ventricle: Left ventricular ejection fraction, by estimation, is 20%. The left ventricle has severely decreased function. The left ventricle demonstrates global hypokinesis. Definity contrast agent was given IV to delineate the left ventricular endocardial borders. There is borderline left ventricular hypertrophy. Right Ventricle: The right ventricular size is normal. Right ventricular systolic function is moderately reduced. Tricuspid regurgitation signal is inadequate for assessing PA pressure. Pericardium: Presence of epicardial fat layer. Mitral Valve: The mitral valve is grossly normal. Mild mitral  valve regurgitation. Aortic Valve: The aortic valve is grossly normal. Aorta: The aortic root is normal in size and structure. There is borderline dilatation of the ascending aorta, measuring 38 mm. LEFT VENTRICLE PLAX 2D LVIDd:         4.85 cm LVIDs:         4.25 cm LV PW:         0.95 cm LV IVS:        1.05 cm  LV Volumes (MOD) LV vol d, MOD A2C: 52.1 ml LV vol d, MOD A4C: 108.0 ml LV vol s, MOD A2C: 48.9 ml LV vol s, MOD A4C: 86.5 ml LV SV MOD A2C:     3.2 ml LV SV MOD A4C:     108.0 ml LV SV MOD BP:      13.2 ml LEFT ATRIUM         Index LA diam:    3.40 cm 1.61 cm/m   AORTA Ao Root diam: 3.30 cm Ao Asc diam:  3.80 cm Cherlynn Kaiser MD Electronically signed by Cherlynn Kaiser MD Signature Date/Time: 05/03/2022/7:44:53 AM    Final    DG CHEST PORT 1 VIEW  Result Date: 05/03/2022 CLINICAL DATA:  Septic shock.  Ventilator dependence. EXAM: PORTABLE CHEST 1 VIEW COMPARISON:  05/02/2022 FINDINGS: 0552 hours. Endotracheal tube tip is 3.2 cm above the base of the carina. Right IJ catheter tip overlies the mid to distal SVC level. Right-sided PICC line tip overlies the region of the right atrium. Inferiorly placed pulmonary artery catheter tip is in the left lower lobe pulmonary artery The NG tube passes into the stomach although the distal tip position is not included on the film. Left base atelectasis again noted. No evidence for pneumothorax or substantial pleural effusion. The cardio pericardial silhouette is enlarged. IMPRESSION: 1. Stable exam. Left base atelectasis without new or progressive interval findings. Electronically Signed   By: Misty Stanley M.D.   On: 05/03/2022 06:14   DG Abd 1 View  Result Date: 05/02/2022 CLINICAL DATA:  Ileus. EXAM: ABDOMEN - 1 VIEW COMPARISON:  April 30, 2022. FINDINGS: Limited exam due to body habitus. No definite abnormal bowel dilatation is noted. Distal tip of nasogastric tube is seen in proximal stomach. IMPRESSION: Limited exam due to body habitus. No definite  evidence of abnormal bowel dilatation. Electronically Signed   By: Marijo Conception M.D.   On: 05/02/2022 14:54   DG  CHEST PORT 1 VIEW  Result Date: 05/02/2022 CLINICAL DATA:  CHF, placement of Swan-Ganz catheter EXAM: PORTABLE CHEST 1 VIEW COMPARISON:  Previous studies including the examination of 05/01/2022 FINDINGS: Tip of endotracheal tube is 3.9 cm above the carina. There is interval removal of left IJ central venous catheter. Right IJ central venous catheter is seen with its tip in superior vena cava. Tip of right PICC line is seen in the region of right atrium. Enteric tube is noted traversing the esophagus. There is interval placement of Swan-Ganz catheter through the lower extremity with its tip in the region of left lower lobe pulmonary artery. There are linear densities in left lower lung field. There are no signs of alveolar pulmonary edema or new focal infiltrates. There is no pleural effusion or pneumothorax. IMPRESSION: Tip of Swan-Ganz catheter is seen in the course of left lower lobe pulmonary artery. There are linear densities in left lower lung fields suggesting subsegmental atelectasis. Other support devices as described in the body of the report. Electronically Signed   By: Elmer Picker M.D.   On: 05/02/2022 08:53   CARDIAC CATHETERIZATION  Result Date: 05/01/2022 1.  Normal right dominant circulation without of thrombotic occlusion, spasm, or dissection. 2.  Cardiogenic shock with a cardiac output of 3.96 L/min and a cardiac index of 1.87 L/min/m.  The mean RA pressure was 13 mmHg, the mean wedge pressure was 19 mmHg, and the mean PA pressure was 29 mmHg; the PAPI is 0.7.  LVEDP is 16 mmHg.  The MAP at this time was 100 mmHg with the patient on both norepinephrine and vasopressin.  Dobutamine 5 mcg/kg/min was initiated, pressors were stopped and MAP goal of around 70 to 75 mmHg will be pursued.  This was discussed with pulmonary critical care. Recommendations: Continue dobutamine  with map goal of 70 to 75 mmHg and resume CRRT for volume removal and management of acid-base status.   DG CHEST PORT 1 VIEW  Result Date: 05/01/2022 CLINICAL DATA:  Central line placement. EXAM: PORTABLE CHEST 1 VIEW COMPARISON:  One-view chest x-ray 04/30/2022. FINDINGS: Endotracheal tube is stable, 3.7 cm above the carina. Side port of the NG tube is in the fundus the stomach. A left IJ line is stable, terminating at the cavoatrial junction. Right-sided PICC line and extends just beyond the left IJ line. A right IJ line is new, terminating above the cavoatrial junction. No pneumothorax is present. Heart size is exaggerate by low lung volumes. Left greater than right basilar airspace disease is stable. IMPRESSION: 1. Interval placement of right IJ line without radiographic evidence for complication. 2. Stable left IJ line. 3. Stable left greater than right basilar airspace disease. 4. Stable endotracheal tube, 3.7 cm above the carina. 5. Low lung volumes with persistent left lower lobe airspace disease concerning for pneumonia or aspiration. Electronically Signed   By: San Morelle M.D.   On: 05/01/2022 11:51    Anti-infectives: Anti-infectives (From admission, onward)    Start     Dose/Rate Route Frequency Ordered Stop   05/04/22 1000  vancomycin (VANCOCIN) IVPB 1000 mg/200 mL premix        1,000 mg 200 mL/hr over 60 Minutes Intravenous Every 24 hours 05/03/22 0802     05/03/22 0845  vancomycin (VANCOREADY) IVPB 2000 mg/400 mL        2,000 mg 200 mL/hr over 120 Minutes Intravenous  Once 05/03/22 0751     05/01/22 1800  piperacillin-tazobactam (ZOSYN) IVPB 3.375 g  Status:  Discontinued  3.375 g 12.5 mL/hr over 240 Minutes Intravenous Every 6 hours 05/01/22 1426 05/01/22 1427   05/01/22 1515  piperacillin-tazobactam (ZOSYN) IVPB 3.375 g        3.375 g 100 mL/hr over 30 Minutes Intravenous Every 6 hours 05/01/22 1428     04/30/22 1400  piperacillin-tazobactam (ZOSYN) IVPB 2.25 g   Status:  Discontinued        2.25 g 100 mL/hr over 30 Minutes Intravenous Every 8 hours 04/30/22 1325 05/01/22 1426   04/29/22 1530  piperacillin-tazobactam (ZOSYN) IVPB 3.375 g  Status:  Discontinued        3.375 g 12.5 mL/hr over 240 Minutes Intravenous Every 8 hours 04/29/22 1433 04/30/22 1325   04/29/22 1145  metroNIDAZOLE (FLAGYL) IVPB 500 mg  Status:  Discontinued        500 mg 100 mL/hr over 60 Minutes Intravenous Every 12 hours 04/29/22 1047 04/29/22 1433   04/29/22 1145  vancomycin (VANCOCIN) IVPB 1000 mg/200 mL premix  Status:  Discontinued        1,000 mg 200 mL/hr over 60 Minutes Intravenous  Once 04/29/22 1047 04/29/22 1055   04/29/22 1145  vancomycin (VANCOREADY) IVPB 1500 mg/300 mL  Status:  Discontinued        1,500 mg 150 mL/hr over 120 Minutes Intravenous Every 24 hours 04/29/22 1055 04/29/22 1357   04/29/22 1045  ceFEPIme (MAXIPIME) 2 g in sodium chloride 0.9 % 100 mL IVPB  Status:  Discontinued        2 g 200 mL/hr over 30 Minutes Intravenous Every 8 hours 04/29/22 0954 04/29/22 1433   04/23/22 0730  ceFAZolin (ANCEF) IVPB 2g/100 mL premix        2 g 200 mL/hr over 30 Minutes Intravenous On call to O.R. 04/23/22 0726 04/23/22 4431       Assessment/Plan: POD 10 incisional hernia repair -postop ileus resolving complicated by aspiration- continue ng tube, would hold tf, continue tpn -appreciate ccm, cards and nephrology care  Cardiogenic/septic shock -on abx -cath 10/4 -on dobutamine, levo, vaso -EF improved with therapy  AKI -CRRT  Resp failure -continue vent    Rolm Bookbinder 05/03/2022

## 2022-05-03 NOTE — Progress Notes (Addendum)
Pharmacy Antibiotic Note  Patricia [name] is a 51 y.o. female admitted on 04/23/2022 with sepsis.  Pharmacy has been consulted for vancomycin dosing. Patinet is on Zosyn per MD.   Pt presented for hernia repair, now with cardiogenic and septic shock requiring inotropes, vasopressors, and mechanical ventilation. Pt has acute renal failure requiring CRRT. Pt has severe thrombocytopenia due to sepsis vs possible HIT, ab is pending. WBC acutely rose to 19 today on stress dose steroids. Pt is afebrile but on CRRT.  Plan: Give vancomycin 2000 mg load Start vancomycin 1000 mg every 24 hours while on CRRT Zosyn 3.375 g q6h per MD F/u signs and symptoms of infection and cultures  Height: '5\' 4"'$  (162.6 cm) Weight: 112.2 kg (247 lb 5.7 oz) IBW/kg (Calculated) : 54.7  Temp (24hrs), Avg:97.5 F (36.4 C), Min:96.4 F (35.8 C), Max:98.2 F (36.8 C)  Recent Labs  Lab 04/30/22 0037 04/30/22 0055 05/01/22 0100 05/01/22 0416 05/01/22 1600 05/01/22 2325 05/02/22 0410 05/02/22 0832 05/02/22 1123 05/02/22 1531 05/02/22 1952 05/03/22 0411  WBC 7.7  --   --  10.0  --  6.9 7.9  --   --   --   --  19.0*  CREATININE  --    < >  --  6.01*  6.13* 4.96* 4.93* 4.02*  --   --  3.01*  --  2.55*  LATICACIDVEN  --    < > 4.3* 4.7*  --   --   --  4.6* 3.9*  --  2.8*  --    < > = values in this interval not displayed.    Estimated Creatinine Clearance: 31.7 mL/min (A) (by C-G formula based on SCr of 2.55 mg/dL (H)).    Allergies  Allergen Reactions   Iodinated Contrast Media Shortness Of Breath, Other (See Comments) and Cough    Throat itching    Peanut-Containing Drug Products Anaphylaxis    Peanut Oil Only.  Can eat peanuts with no problems   Oxycodone-Acetaminophen Itching    Antimicrobials this admission: Zosyn 10/4 >>  Vanc 10/6 >>   Dose adjustments this admission: none  Microbiology results: 10/2 BCx: NGTD 10/3 BCxL NG x 2 days 10/2 MRSA PCR: neg  Thank you for allowing pharmacy to  be a part of this patient's care.  Ursula Beath 05/03/2022 8:27 AM

## 2022-05-03 NOTE — Progress Notes (Addendum)
PHARMACY - TOTAL PARENTERAL NUTRITION CONSULT NOTE   Indication: Small bowel obstruction  Patient Measurements: Height: '5\' 4"'  (162.6 cm) Weight: 112.2 kg (247 lb 5.7 oz) IBW/kg (Calculated) : 54.7 TPN AdjBW (KG): 68.4 Body mass index is 42.46 kg/m.   Assessment: 51 yo female s/p difficult incisional hernia repair with long lysis of adhesion on 9/26. Who has developed a SBO based on CT scan 10/2.  Poor po intake since surgery  due to abdominal pain.  Pharmacy consulted to start TPN.  Patient intubated on 10/3 due to respiratory distress. Bowel function overnight, plans to hopefully start trickle feeds in the next 24 hours and potentially wean TPN off. Surgery recs holding tube feeds until pressor requirement decreased.   Glucose / Insulin: CBGs above goal but improved with rSSI and 15 units in TPN and now ~190s. Utilized 46 units / 24 hrs. SM started 10/2, continues on HC 100 q12h - A1c 6.1 in Sept 2023 Electrolytes: Na 135, K 4.0 (4k CRRT bags), CO2 24, CoCa 9.2 (s/p 4g), phos 1.7, Mg 2.2. Other electrolytes wnl.  Renal: CRRT starting 10/4, held briefly for cath on 10/4.  Hepatic: T bili: 1.7, AST/ALT 700/439. Alk phos wnl. TG 828 > 333 (propofol stopped 10/4) Intake / Output; MIVF: UOP 15 ml. CRRT 4.3L. LBM 10/5 x2 - 645 ml GI Imaging: 10/3 CT ab: continued SBO  10/2 CT Scan - SBO GI Surgeries / Procedures:  10/4: concern of STEMI s/p cath without occlusion  Central access: CVC triple lumen  TPN start date: 10/2  Nutritional Goals: Goal TPN rate is 70 mL/hr (provides 124 g of protein and 1848 kcals per day)  RD Assessment:  Estimated Needs Total Energy Estimated Needs: 1800-2000 kcal/d Total Protein Estimated Needs: 110-130 grams Total Fluid Estimated Needs: 1.8-2L/d  Current Nutrition:  NPO TPN - lipids removed   Plan:  Continue concentrated TPN at new goal of 70 ml/hr to provide 124g AA and 1848 kcal to meet 100% of goals - adjusted based on change in lipid Add back  lipids at 20% of kcal. Follow up TG trend as appropriate to increase to 30%.  Electrolytes in TPN: Increase phos to 15 mmol/L. Continue Na 100 mEq/L, K 0 mEq/L, Ca 5 mEq/L, Mg 0 mEq/L. Adjust Cl:Ac 1:1  Sodium phos 30 mmol as already ordered Add k phos neutral 500 BID while on CRRT  Add standard MVI and trace elements to TPN, chromium removed for CRRT  Continue resistant SSI as TPN is titrated to goal.  Increase regular insulin to 25 units in TPN Scheduled 4 units q4h per CCM  Monitor TPN labs on Mon/Thurs, and daily until stabilized  Cristela Felt, PharmD, BCPS Clinical Pharmacist 05/03/2022 7:44 AM

## 2022-05-03 NOTE — Progress Notes (Addendum)
NAME:  Patricia [name], MRN:  [id number], DOB:  January 22, 1971, LOS: 8 ADMISSION DATE:  04/23/2022, CONSULTATION DATE:  04/29/2022 REFERRING MD:  Felicie Morn, MD, CHIEF COMPLAINT:   Small bowel Obstruction, aspiration event, Sepsis, Respiratory Failure    History of Present Illness:  Patricia [name] with above hx and nuc study in 2015 neg for ischemia done for chest pain and echo with EF 60-65% presented to hospital for excisional hernia repair (robotic hernia repair 04/23/22) with mesh, ileus post op on TNA. Developed vomiting and hypotension after NG came out on 04/29/22 and PNA on CXR, was tachycardic and BP improved with IV fluids. DX with sepsis and ABX started. At one point prolonged Qtc. Pt admitted to ICU on 10/2.  Was placed on levophed. Early AM on 10/3 was intubated for worsening metabolic/lactic acidosis. She had AKI as well. Crt was 6.01, her normal 0.9. Today Cr is 4 on CRRT.  Pertinent  Medical History  DM2 Depression OSA Migraines  Asthma Obesity Schizophrenia HTN HLD  Significant Hospital Events: Including procedures, antibiotic start and stop dates in addition to other pertinent events   9/26 Robotic hernia repair complicated by abd adhesions.  10/2 CT + for developing SBO and bilat lower lobe opacities most likely aspiration PNA. Progressive decompensation requiring transfer to ICU 10\3 Intubated for resp failure. CVL and arterial line placed. 04/30/22: EF 30-35%, RV moderately reduced, mild to moderate MR, IVC collapsed throughout respiratory cycle 10\4 EKG changes/trop increased--> and taken to cath lab - No obstructive CAD, but in decompensated heart failure, Bedside echo with EF 15%, new WMA, moderate RV dysfunction, trivial pericardial effusion.underwent emergent L/RHC. Normal coronaries, RA mean 13, PA mean 29, LVEDP 16, PCWP 19, CO/CI 3.96/1.87   returned to CICU post-cath. Right heart cath placed right fem. Right IJ HD cath placed.  10/5 advanced HF team  following, shock supported on norepi/vasopressin and Dobutamine. On CRRT.  10/6 weaning Norepi. Starting to wean fent. Needing restraints.   Interim History / Subjective:  Agitated at times   Objective   Blood pressure 95/60, pulse (!) 118, temperature (!) 96.4 F (35.8 C), resp. rate (!) 21, height '5\' 4"'$  (1.626 m), weight 109.3 kg, last menstrual period 07/07/2014, SpO2 100 %. PAP: (27-40)/(19-32) 27/20 CVP:  [20 mmHg-27 mmHg] 21 mmHg CO:  [3.6 L/min-5.1 L/min] 3.9 L/min CI:  [1.7 L/min/m2-2 L/min/m2] 1.8 L/min/m2  Vent Mode: PRVC FiO2 (%):  [40 %] 40 % Set Rate:  [24 bmp-28 bmp] 24 bmp Vt Set:  [430 mL] 430 mL PEEP:  [0.8 cmH20-8 cmH20] 0.8 cmH20 Plateau Pressure:  [23 cmH20] 23 cmH20   Intake/Output Summary (Last 24 hours) at 05/02/2022 0923 Last data filed at 05/02/2022 0600 Gross per 24 hour  Intake 2785.74 ml  Output 2076 ml  Net 709.74 ml   Filed Weights   04/23/22 0717  Weight: 109.3 kg    Examination: General 51 year old female pt currently sedated on vent  HENT NCAT no JVD MMM, right IJ HD cath unremarkable  Pulm some scattered rhonchi, dec bases. PCXR personally reviewed: tubes and lines good position, has some patchy bilateral airspace disease but overall aeration has improved Card rrr.  Abd soft. Dressing intact. Has NGT clamped Gu min UOP  Neuro awakens to voice. Agitated today   Resolved Hospital Problem list   Unable to perform as pt intubated and sedated.   Assessment & Plan:   Circulatory shock. Mixed picture of Septic Shock/MODS/lactic acidosis (2/2 aspiration PNA) and acute  decompensated biventricular HF (suspect septic CM given clean coronaries; EF 15% RV w/mod dysfxn)  Plan  Cont to aim to keep her euvolemic (CVP goal ~10)  Weaning norepi and vasopressin for MAP > 65 Dobutamine per HF team  Day 5 zosyn, stop date TBD Cont tele  Trend pct; HF Started vanc d/t rising WBC (this could be her steroids)  Complete 5d stress dose steroids (started  10/3)   Acute hypoxic respiratory Failure 2/2 aspiration pneumonia and compensation to metabolic acidosis Plan Cont full vent support  Daily assessment for weaning PAD goal for RASS 0-1 VAP bundle    AKI w/ AGMA 2/2 lactic acidosis all 2/2 shock Baseline scr 0.9 Plan CRRT Renal dose meds Am chem  Intermittent fluid and electrolyte imbalance Plan Monitor and correct as indicated  Acute metabolic encephalopathy superimposed on h/o bipolar disease and schizophrenia  -likely 2/2 sepsis and renal failure Plan PAd protocol Weaning fent  Cont precedex  S/p hernia repair c/b ileus vs SBO +Bm 10/5 Plan Cont bowel rest per surg    Thrombocytopenia Likely 2/2 sepsis Plan Trend, will f/u HIT panel  Scds May need to remove heparin from CRRT  Hyperglycemia w/ h/o type II DM Plan Ssi w/ scheduled q4  Best Practice (right click and "Reselect all SmartList Selections" daily)   Diet/type: TPN DVT prophylaxis: prophylactic heparin  GI prophylaxis: PPI Lines: Central line Foley:  Yes, and it is still needed Code Status:  full code   Critical care time: 32 min Erick Colace ACNP-BC [place] Pager # [phone] OR # [phone] if no answer      Erick Colace ACNP-BC [place] Pager # [phone] OR # [phone] if no answer

## 2022-05-04 ENCOUNTER — Encounter (HOSPITAL_COMMUNITY): Payer: Medicaid Other

## 2022-05-04 ENCOUNTER — Inpatient Hospital Stay (HOSPITAL_COMMUNITY): Payer: Medicaid Other

## 2022-05-04 ENCOUNTER — Inpatient Hospital Stay: Payer: Self-pay

## 2022-05-04 DIAGNOSIS — R6521 Severe sepsis with septic shock: Secondary | ICD-10-CM | POA: Diagnosis not present

## 2022-05-04 DIAGNOSIS — A419 Sepsis, unspecified organism: Secondary | ICD-10-CM | POA: Diagnosis not present

## 2022-05-04 LAB — CULTURE, BLOOD (ROUTINE X 2)
Culture: NO GROWTH
Special Requests: ADEQUATE

## 2022-05-04 LAB — RENAL FUNCTION PANEL
Albumin: <1.5 g/dL — ABNORMAL LOW (ref 3.5–5.0)
Albumin: 1.5 g/dL — ABNORMAL LOW (ref 3.5–5.0)
Anion gap: 8 (ref 5–15)
Anion gap: 10 (ref 5–15)
BUN: 32 mg/dL — ABNORMAL HIGH (ref 6–20)
BUN: 35 mg/dL — ABNORMAL HIGH (ref 6–20)
CO2: 24 mmol/L (ref 22–32)
CO2: 22 mmol/L (ref 22–32)
Calcium: 6.5 mg/dL — ABNORMAL LOW (ref 8.9–10.3)
Calcium: 6.8 mg/dL — ABNORMAL LOW (ref 8.9–10.3)
Chloride: 106 mmol/L (ref 98–111)
Chloride: 103 mmol/L (ref 98–111)
Creatinine, Ser: 1.9 mg/dL — ABNORMAL HIGH (ref 0.44–1.00)
Creatinine, Ser: 1.89 mg/dL — ABNORMAL HIGH (ref 0.44–1.00)
GFR, Estimated: 31 mL/min — ABNORMAL LOW (ref >60)
GFR, Estimated: 32 mL/min — ABNORMAL LOW (ref >60)
Glucose, Bld: 207 mg/dL — ABNORMAL HIGH (ref 70–99)
Glucose, Bld: 216 mg/dL — ABNORMAL HIGH (ref 70–99)
Phosphorus: 2.1 mg/dL — ABNORMAL LOW (ref 2.5–4.6)
Phosphorus: 2.7 mg/dL (ref 2.5–4.6)
Potassium: 3.3 mmol/L — ABNORMAL LOW (ref 3.5–5.1)
Potassium: 3.7 mmol/L (ref 3.5–5.1)
Sodium: 138 mmol/L (ref 135–145)
Sodium: 135 mmol/L (ref 135–145)

## 2022-05-04 LAB — CBC WITH DIFFERENTIAL/PLATELET
Abs Immature Granulocytes: 2.44 10*3/uL — ABNORMAL HIGH (ref 0.00–0.07)
Basophils Absolute: 0 10*3/uL (ref 0.0–0.1)
Basophils Relative: 0 %
Eosinophils Absolute: 0 10*3/uL (ref 0.0–0.5)
Eosinophils Relative: 0 %
HCT: 26.7 % — ABNORMAL LOW (ref 36.0–46.0)
Hemoglobin: 8.9 g/dL — ABNORMAL LOW (ref 12.0–15.0)
Immature Granulocytes: 9 %
Lymphocytes Relative: 3 %
Lymphs Abs: 0.9 10*3/uL (ref 0.7–4.0)
MCH: 27.5 pg (ref 26.0–34.0)
MCHC: 33.3 g/dL (ref 30.0–36.0)
MCV: 82.4 fL (ref 80.0–100.0)
Monocytes Absolute: 1 10*3/uL (ref 0.1–1.0)
Monocytes Relative: 4 %
Neutro Abs: 22.6 10*3/uL — ABNORMAL HIGH (ref 1.7–7.7)
Neutrophils Relative %: 84 %
Platelets: 27 10*3/uL — CL (ref 150–400)
RBC: 3.24 MIL/uL — ABNORMAL LOW (ref 3.87–5.11)
RDW: 15.1 % (ref 11.5–15.5)
WBC: 26.9 10*3/uL — ABNORMAL HIGH (ref 4.0–10.5)
nRBC: 1.9 % — ABNORMAL HIGH (ref 0.0–0.2)

## 2022-05-04 LAB — POCT I-STAT EG7
Acid-base deficit: 2 mmol/L (ref 0.0–2.0)
Bicarbonate: 23.9 mmol/L (ref 20.0–28.0)
Calcium, Ion: 0.99 mmol/L — ABNORMAL LOW (ref 1.15–1.40)
HCT: 51 % — ABNORMAL HIGH (ref 36.0–46.0)
Hemoglobin: 17.3 g/dL — ABNORMAL HIGH (ref 12.0–15.0)
O2 Saturation: 45 %
Patient temperature: 36.3
Potassium: 3.7 mmol/L (ref 3.5–5.1)
Sodium: 136 mmol/L (ref 135–145)
TCO2: 25 mmol/L (ref 22–32)
pCO2, Ven: 40.5 mmHg — ABNORMAL LOW (ref 44–60)
pH, Ven: 7.376 (ref 7.25–7.43)
pO2, Ven: 24 mmHg — CL (ref 32–45)

## 2022-05-04 LAB — GLUCOSE, CAPILLARY
Glucose-Capillary: 189 mg/dL — ABNORMAL HIGH (ref 70–99)
Glucose-Capillary: 203 mg/dL — ABNORMAL HIGH (ref 70–99)
Glucose-Capillary: 235 mg/dL — ABNORMAL HIGH (ref 70–99)
Glucose-Capillary: 229 mg/dL — ABNORMAL HIGH (ref 70–99)
Glucose-Capillary: 131 mg/dL — ABNORMAL HIGH (ref 70–99)

## 2022-05-04 LAB — COOXEMETRY PANEL
Carboxyhemoglobin: 1.3 % (ref 0.5–1.5)
Carboxyhemoglobin: 1 % (ref 0.5–1.5)
Methemoglobin: <0.7 % (ref 0.0–1.5)
Methemoglobin: <0.7 % (ref 0.0–1.5)
O2 Saturation: 63.1 %
O2 Saturation: 54.4 %
Total hemoglobin: 9.1 g/dL — ABNORMAL LOW (ref 12.0–16.0)
Total hemoglobin: 9.7 g/dL — ABNORMAL LOW (ref 12.0–16.0)

## 2022-05-04 LAB — MAGNESIUM: Magnesium: 2.1 mg/dL (ref 1.7–2.4)

## 2022-05-04 LAB — HEPARIN INDUCED PLATELET AB (HIT ANTIBODY): Heparin Induced Plt Ab: 0.147 OD (ref 0.000–0.400)

## 2022-05-04 LAB — LIPOPROTEIN A (LPA): Lipoprotein (a): 37.4 nmol/L — ABNORMAL HIGH (ref <75.0)

## 2022-05-04 MED ORDER — POTASSIUM PHOSPHATES 15 MMOLE/5ML IV SOLN
30.0000 mmol | Freq: Once | INTRAVENOUS | Status: AC
  Administered 2022-05-04: 30 mmol via INTRAVENOUS
  Filled 2022-05-04: qty 10

## 2022-05-04 MED ORDER — TRACE MINERALS CU-MN-SE-ZN 300-55-60-3000 MCG/ML IV SOLN
INTRAVENOUS | Status: AC
  Filled 2022-05-04: qty 828.8

## 2022-05-04 MED ORDER — MIDAZOLAM BOLUS VIA INFUSION
2.0000 mg | INTRAVENOUS | Status: DC | PRN
  Administered 2022-05-04 – 2022-05-07 (×8): 2 mg via INTRAVENOUS

## 2022-05-04 MED ORDER — INSULIN ASPART 100 UNIT/ML IJ SOLN
7.0000 [IU] | INTRAMUSCULAR | Status: DC
  Administered 2022-05-04 – 2022-05-09 (×30): 7 [IU] via SUBCUTANEOUS

## 2022-05-04 MED ORDER — MIDAZOLAM-SODIUM CHLORIDE 100-0.9 MG/100ML-% IV SOLN
0.5000 mg/h | INTRAVENOUS | Status: DC
  Administered 2022-05-04: 2 mg/h via INTRAVENOUS
  Administered 2022-05-04 – 2022-05-05 (×2): 8 mg/h via INTRAVENOUS
  Administered 2022-05-06 – 2022-05-07 (×3): 10 mg/h via INTRAVENOUS
  Administered 2022-05-08: 2.5 mg/h via INTRAVENOUS
  Filled 2022-05-04 (×7): qty 100

## 2022-05-04 NOTE — Progress Notes (Signed)
NAME:  Patricia [name], MRN:  [id number], DOB:  [birth year], LOS: 58 ADMISSION DATE:  04/23/2022, CONSULTATION DATE:  04/29/2022 REFERRING MD:  Patricia Morn, MD, CHIEF COMPLAINT:   Small bowel Obstruction, aspiration event, Sepsis, Respiratory Failure    History of Present Illness:  Patricia [name] with above hx and nuc study in 2015 neg for ischemia done for chest pain and echo with EF 60-65% presented to hospital for excisional hernia repair (robotic hernia repair 04/23/22) with mesh, ileus post op on TNA. Developed vomiting and hypotension after NG came out on 04/29/22 and PNA on CXR, was tachycardic and BP improved with IV fluids. DX with sepsis and ABX started. At one point prolonged Qtc. Pt admitted to ICU on 10/2.  Was placed on levophed. Early AM on 10/3 was intubated for worsening metabolic/lactic acidosis. She had AKI as well. Crt was 6.01, her normal 0.9. Today Cr is 4 on CRRT.  Pertinent  Medical History  DM2 Depression OSA Migraines  Asthma Obesity Schizophrenia HTN HLD  Significant Hospital Events: Including procedures, antibiotic start and stop dates in addition to other pertinent events   9/26 Robotic hernia repair complicated by abd adhesions.  10/2 CT + for developing SBO and bilat lower lobe opacities most likely aspiration PNA. Progressive decompensation requiring transfer to ICU 10\3 Intubated for resp failure. CVL and arterial line placed. 04/30/22: EF 30-35%, RV moderately reduced, mild to moderate MR, IVC collapsed throughout respiratory cycle 10\4 EKG changes/trop increased--> and taken to cath lab - No obstructive CAD, but in decompensated heart failure, Bedside echo with EF 15%, new WMA, moderate RV dysfunction, trivial pericardial effusion.underwent emergent L/RHC. Normal coronaries, RA mean 13, PA mean 29, LVEDP 16, PCWP 19, CO/CI 3.96/1.87   returned to CICU post-cath. Right heart cath placed right fem. Right IJ HD cath placed.  10/5 advanced HF team  following, shock supported on norepi/vasopressin and Dobutamine. On CRRT.  10/6 weaning Norepi. Starting to wean fent. Needing restraints.   Interim History / Subjective:  Tolerating PSV 12, off NE. Still on VP. Periods of agitation, responsive to versed.  Objective   Blood pressure 107/69, pulse 73, temperature (!) 97.2 F (36.2 C), resp. rate 18, height '5\' 4"'$  (1.626 m), weight 114.9 kg, last menstrual period 07/07/2014, SpO2 92 %. PAP: (24-42)/(12-31) 37/28 CVP:  [1 mmHg-16 mmHg] 1 mmHg CO:  [3.7 L/min-3.8 L/min] 3.8 L/min CI:  [1.7 L/min/m2] 1.7 L/min/m2  Vent Mode: CPAP;PSV FiO2 (%):  [40 %] 40 % Set Rate:  [24 bmp] 24 bmp Vt Set:  [430 mL] 430 mL PEEP:  [5 cmH20] 5 cmH20 Pressure Support:  [12 cmH20] 12 cmH20 Plateau Pressure:  [14 cmH20-21 cmH20] 18 cmH20   Intake/Output Summary (Last 24 hours) at 05/04/2022 1115 Last data filed at 05/04/2022 1100 Gross per 24 hour  Intake 4037.33 ml  Output 3903 ml  Net 134.33 ml    Filed Weights   04/23/22 0717 05/03/22 0332 05/04/22 0500  Weight: 109.3 kg 112.2 kg 114.9 kg    Examination: General 51 year old female pt currently sedated on vent, obese HENT NCAT no JVD MMM, right IJ HD cath unremarkable  Pulm: clear to auscultation bilaterally. Will tolerate PSV down to 8/5 with acceptable  Card: extremities warm without edema. HS distant. Hemodynamics reviewed. Abd soft. Dressing intact. Minimal NG drainage. Bowel sounds are active.  Gu min UOP  Neuro awakens to voice. Agitated today   Ancillary tests personally reviewed  Marked leukocytosis persists   Assessment &  Plan:   Circulatory shock. Mixed picture of Septic Shock/MODS/lactic acidosis (2/2 aspiration PNA) and acute decompensated biventricular HF (suspect septic CM given clean coronaries; EF 15% RV w/mod dysfxn)   Cont to aim to keep her euvolemic (CVP goal ~10)  Weaning norepi and vasopressin for MAP > 65 Dobutamine per HF team  Day 5 zosyn, stop date TBD Cont tele   Trend pct; HF Started vanc d/t rising WBC (this could be her steroids)  Complete 5d stress dose steroids (started 10/3)   Acute hypoxic respiratory Failure 2/2 aspiration pneumonia and compensation to metabolic acidosis  Cont full vent support  Daily assessment for weaning - close, but would like VP dose lower and PAC out of groin so she can sit up  PAD goal for RASS 0-1 - will switch from precedex to versed as latter seems more effective.  VAP bundle    AKI w/ AGMA 2/2 lactic acidosis all 2/2 shock  Continue CRRT - run even as not clinica Renal dose meds Am chem   Acute metabolic encephalopathy superimposed on h/o bipolar disease and schizophrenia   Switch to midazolam infusion  Will benefit from re-introduction of home medications  S/p hernia repair c/b ileus vs SBO  Continued bowel rest per General Surgery   Thrombocytopenia, likely sepsis related.   HITT/SRA pending Heparin stopped Doppler US legs to rule out consumptive thrombocytopenia  Hyperglycemia w/ h/o type II DM - uncontrolled on TPN  Increase basal insulin   Best Practice (right click and "Reselect all SmartList Selections" daily)   Diet/type: TPN DVT prophylaxis: prophylactic heparin  GI prophylaxis: PPI Lines: Central line Foley:  Yes, and it is still needed Code Status:  full code  CRITICAL CARE Performed by: Kipp Brood   Total critical care time: 40 minutes  Critical care time was exclusive of separately billable procedures and treating other patients.  Critical care was necessary to treat or prevent imminent or life-threatening deterioration.  Critical care was time spent personally by me on the following activities: development of treatment plan with patient and/or surrogate as well as nursing, discussions with consultants, evaluation of patient's response to treatment, examination of patient, obtaining history from patient or surrogate, ordering and performing treatments and  interventions, ordering and review of laboratory studies, ordering and review of radiographic studies, pulse oximetry, re-evaluation of patient's condition and participation in multidisciplinary rounds.  Kipp Brood, MD [facility] ICU Physician [place]  Pager: [phone] Mobile: [phone] After hours: [phone].

## 2022-05-04 NOTE — Progress Notes (Signed)
Trauma/Critical Care Follow Up Note  Subjective:    Overnight Issues:   Objective:  Vital signs for last 24 hours: Temp:  [96.4 F (35.8 C)-97.3 F (36.3 C)] 96.8 F (36 C) (10/07 0800) Pulse Rate:  [67-91] 67 (10/07 0842) Resp:  [18-34] 19 (10/07 0842) BP: (103-120)/(69-84) 107/69 (10/07 0323) SpO2:  [81 %-100 %] 100 % (10/07 0842) Arterial Line BP: (98-140)/(63-94) 109/71 (10/07 0800) FiO2 (%):  [40 %] 40 % (10/07 0842) Weight:  [114.9 kg] 114.9 kg (10/07 0500)  Hemodynamic parameters for last 24 hours: PAP: (24-42)/(12-31) 25/19 CVP:  [2 mmHg-16 mmHg] 9 mmHg CO:  [3.7 L/min-3.8 L/min] 3.8 L/min CI:  [1.7 L/min/m2] 1.7 L/min/m2  Intake/Output from previous day: 10/06 0701 - 10/07 0700 In: 4190.9 [I.V.:3337.9; IV Piggyback:853] Out: 6270 [Urine:31; Emesis/NG output:150]  Intake/Output this shift: Total I/O In: 130.8 [I.V.:130.8] Out: 139 [Urine:15]  Vent settings for last 24 hours: Vent Mode: CPAP;PSV FiO2 (%):  [40 %] 40 % Set Rate:  [24 bmp] 24 bmp Vt Set:  [430 mL] 430 mL PEEP:  [5 cmH20] 5 cmH20 Pressure Support:  [12 cmH20] 12 cmH20 Plateau Pressure:  [14 cmH20-21 cmH20] 18 cmH20  Physical Exam:  Gen: comfortable, no distress Neuro: sedated HEENT: PERRL Neck: supple CV: RRR Pulm: unlabored breathing Abd: soft, incisions cdi GU: CRRT Extr: wwp   Results for orders placed or performed during the hospital encounter of 04/23/22 (from the past 24 hour(s))  Glucose, capillary     Status: Abnormal   Collection Time: 05/03/22 11:28 AM  Result Value Ref Range   Glucose-Capillary 191 (H) 70 - 99 mg/dL  Lactic acid, plasma     Status: Abnormal   Collection Time: 05/03/22 12:29 PM  Result Value Ref Range   Lactic Acid, Venous 2.0 (HH) 0.5 - 1.9 mmol/L  Cooxemetry Panel (carboxy, met, total hgb, O2 sat)     Status: Abnormal   Collection Time: 05/03/22 12:37 PM  Result Value Ref Range   Total hemoglobin 10.3 (L) 12.0 - 16.0 g/dL   O2 Saturation 64.4 %    Carboxyhemoglobin 0.9 0.5 - 1.5 %   Methemoglobin <0.7 0.0 - 1.5 %  MRSA Next Gen by PCR, Nasal     Status: None   Collection Time: 05/03/22  1:24 PM   Specimen: Nasal Mucosa; Nasal Swab  Result Value Ref Range   MRSA by PCR Next Gen NOT DETECTED NOT DETECTED  Lactic acid, plasma     Status: Abnormal   Collection Time: 05/03/22  3:23 PM  Result Value Ref Range   Lactic Acid, Venous 2.0 (HH) 0.5 - 1.9 mmol/L  Glucose, capillary     Status: Abnormal   Collection Time: 05/03/22  3:43 PM  Result Value Ref Range   Glucose-Capillary 225 (H) 70 - 99 mg/dL  Renal function panel (daily at 1600)     Status: Abnormal   Collection Time: 05/03/22  5:07 PM  Result Value Ref Range   Sodium 136 135 - 145 mmol/L   Potassium 3.7 3.5 - 5.1 mmol/L   Chloride 103 98 - 111 mmol/L   CO2 23 22 - 32 mmol/L   Glucose, Bld 228 (H) 70 - 99 mg/dL   BUN 29 (H) 6 - 20 mg/dL   Creatinine, Ser 2.25 (H) 0.44 - 1.00 mg/dL   Calcium 6.7 (L) 8.9 - 10.3 mg/dL   Phosphorus 3.3 2.5 - 4.6 mg/dL   Albumin 1.5 (L) 3.5 - 5.0 g/dL   GFR, Estimated 26 (L) >  60 mL/min   Anion gap 10 5 - 15  Lactic acid, plasma     Status: None   Collection Time: 05/03/22  5:20 PM  Result Value Ref Range   Lactic Acid, Venous 1.9 0.5 - 1.9 mmol/L  Cooxemetry Panel (carboxy, met, total hgb, O2 sat)     Status: Abnormal   Collection Time: 05/03/22  5:20 PM  Result Value Ref Range   Total hemoglobin 10.5 (L) 12.0 - 16.0 g/dL   O2 Saturation 54.6 %   Carboxyhemoglobin 0.5 0.5 - 1.5 %   Methemoglobin <0.7 0.0 - 1.5 %  POCT I-Stat EG7     Status: Abnormal   Collection Time: 05/03/22  6:46 PM  Result Value Ref Range   pH, Ven 7.339 7.25 - 7.43   pCO2, Ven 44.3 44 - 60 mmHg   pO2, Ven 25 (LL) 32 - 45 mmHg   Bicarbonate 24.0 20.0 - 28.0 mmol/L   TCO2 25 22 - 32 mmol/L   O2 Saturation 44 %   Acid-base deficit 2.0 0.0 - 2.0 mmol/L   Sodium 138 135 - 145 mmol/L   Potassium 3.5 3.5 - 5.1 mmol/L   Calcium, Ion 1.00 (L) 1.15 - 1.40 mmol/L    HCT 29.0 (L) 36.0 - 46.0 %   Hemoglobin 9.9 (L) 12.0 - 15.0 g/dL   Patient temperature 36.2 C    Sample type VENOUS   Cooxemetry Panel (carboxy, met, total hgb, O2 sat)     Status: Abnormal   Collection Time: 05/03/22  7:00 PM  Result Value Ref Range   Total hemoglobin 7.0 (L) 12.0 - 16.0 g/dL   O2 Saturation 53.4 %   Carboxyhemoglobin 1.3 0.5 - 1.5 %   Methemoglobin <0.7 0.0 - 1.5 %  Glucose, capillary     Status: Abnormal   Collection Time: 05/03/22  7:37 PM  Result Value Ref Range   Glucose-Capillary 204 (H) 70 - 99 mg/dL  Glucose, capillary     Status: Abnormal   Collection Time: 05/03/22 11:56 PM  Result Value Ref Range   Glucose-Capillary 201 (H) 70 - 99 mg/dL  Glucose, capillary     Status: Abnormal   Collection Time: 05/04/22  4:08 AM  Result Value Ref Range   Glucose-Capillary 189 (H) 70 - 99 mg/dL  Magnesium     Status: None   Collection Time: 05/04/22  4:17 AM  Result Value Ref Range   Magnesium 2.1 1.7 - 2.4 mg/dL  CBC with Differential/Platelet     Status: Abnormal   Collection Time: 05/04/22  4:17 AM  Result Value Ref Range   WBC 26.9 (H) 4.0 - 10.5 K/uL   RBC 3.24 (L) 3.87 - 5.11 MIL/uL   Hemoglobin 8.9 (L) 12.0 - 15.0 g/dL   HCT 26.7 (L) 36.0 - 46.0 %   MCV 82.4 80.0 - 100.0 fL   MCH 27.5 26.0 - 34.0 pg   MCHC 33.3 30.0 - 36.0 g/dL   RDW 15.1 11.5 - 15.5 %   Platelets 27 (LL) 150 - 400 K/uL   nRBC 1.9 (H) 0.0 - 0.2 %   Neutrophils Relative % 84 %   Neutro Abs 22.6 (H) 1.7 - 7.7 K/uL   Lymphocytes Relative 3 %   Lymphs Abs 0.9 0.7 - 4.0 K/uL   Monocytes Relative 4 %   Monocytes Absolute 1.0 0.1 - 1.0 K/uL   Eosinophils Relative 0 %   Eosinophils Absolute 0.0 0.0 - 0.5 K/uL   Basophils Relative 0 %  Basophils Absolute 0.0 0.0 - 0.1 K/uL   WBC Morphology DOHLE BODIES    RBC Morphology See Note    Immature Granulocytes 9 %   Abs Immature Granulocytes 2.44 (H) 0.00 - 0.07 K/uL   Target Cells PRESENT   Renal function panel     Status: Abnormal    Collection Time: 05/04/22  4:17 AM  Result Value Ref Range   Sodium 138 135 - 145 mmol/L   Potassium 3.3 (L) 3.5 - 5.1 mmol/L   Chloride 106 98 - 111 mmol/L   CO2 24 22 - 32 mmol/L   Glucose, Bld 207 (H) 70 - 99 mg/dL   BUN 32 (H) 6 - 20 mg/dL   Creatinine, Ser 1.90 (H) 0.44 - 1.00 mg/dL   Calcium 6.5 (L) 8.9 - 10.3 mg/dL   Phosphorus 2.1 (L) 2.5 - 4.6 mg/dL   Albumin <1.5 (L) 3.5 - 5.0 g/dL   GFR, Estimated 31 (L) >60 mL/min   Anion gap 8 5 - 15  Cooxemetry Panel (carboxy, met, total hgb, O2 sat)     Status: Abnormal   Collection Time: 05/04/22  4:18 AM  Result Value Ref Range   Total hemoglobin 9.1 (L) 12.0 - 16.0 g/dL   O2 Saturation 63.1 %   Carboxyhemoglobin 1.3 0.5 - 1.5 %   Methemoglobin <0.7 0.0 - 1.5 %  Glucose, capillary     Status: Abnormal   Collection Time: 05/04/22  7:59 AM  Result Value Ref Range   Glucose-Capillary 203 (H) 70 - 99 mg/dL    Assessment & Plan: The plan of care was discussed with the bedside nurse for the day, who is in agreement with this plan and no additional concerns were raised.   Present on Admission:  Hernia of abdominal cavity  Morbid obesity ([place])  Diabetes mellitus type 2 in obese ([facility])  Chronic low back pain  BIPOLAR DISORDER UNSPECIFIED  Essential hypertension    LOS: 10 days   Additional comments:I reviewed the patient's new clinical lab test results.   and I reviewed the patients new imaging test results.    POD 11 incisional hernia repair -postop ileus resolving complicated by aspiration- continue ng tube, would hold tf, continue tpn -appreciate ccm, cards and nephrology care   Cardiogenic/septic shock -on abx -cath 10/4 -on dobutamine, levo, vaso -EF improved with therapy   AKI -CRRT   Resp failure -continue vent    Jesusita Oka, MD Trauma & General Surgery Please use [email or web address] to contact on call provider  05/04/2022  *Care during the described time interval was provided by me. I have reviewed this  patient's available data, including medical history, events of note, physical examination and test results as part of my evaluation.

## 2022-05-04 NOTE — Progress Notes (Signed)
PICC exchange: RUE DL PICC with resistance upon removal. Warm pack applied to medial upper arm. Lavender and peppermint aromatherapy used. Full 41 cm removed, catheter intact. New TL PICC exchanged over guidewire.

## 2022-05-04 NOTE — Progress Notes (Addendum)
Advanced Heart Failure Rounding Note  PCP-Cardiologist: None   Subjective:   Intubated; lightly sedated. Remains responsive, extubation limited by agitation.   Objective:   Weight Range: 114.9 kg Body mass index is 43.48 kg/m.   Vital Signs:   Temp:  [96.4 F (35.8 C)-97.3 F (36.3 C)] 97 F (36.1 C) (10/07 1000) Pulse Rate:  [67-91] 69 (10/07 1000) Resp:  [18-34] 25 (10/07 1000) BP: (103-120)/(69-84) 107/69 (10/07 0323) SpO2:  [81 %-100 %] 98 % (10/07 1000) Arterial Line BP: (98-140)/(63-94) 127/82 (10/07 1000) FiO2 (%):  [40 %] 40 % (10/07 0842) Weight:  [114.9 kg] 114.9 kg (10/07 0500) Last BM Date : 05/03/22  Weight change: Filed Weights   04/23/22 0717 05/03/22 0332 05/04/22 0500  Weight: 109.3 kg 112.2 kg 114.9 kg    Intake/Output:   Intake/Output Summary (Last 24 hours) at 05/04/2022 1026 Last data filed at 05/04/2022 1000 Gross per 24 hour  Intake 4130.89 ml  Output 3895 ml  Net 235.89 ml       Physical Exam   CVP 8-10 General: Intubated HEENT: ETT  Neck: Supple.  . Carotids 2+ bilat; no bruits. No lymphadenopathy or thyromegaly appreciated. RIJ  Cor: PMI nondisplaced. RRR Lungs: Clear Abdomen: No BS.  No hepatosplenomegaly. No bruits or masses. Flexi seal  Extremities: No cyanosis, clubbing, rash, R and LLE SCDs. RUE PICC. R femoral swan Neuro: Intubated. MAE x4. Follows commands.    Telemetry   SR 80s personally checked.   EKG    N/A  Labs    CBC Recent Labs    05/03/22 0411 05/03/22 1846 05/04/22 0417  WBC 19.0*  --  26.9*  NEUTROABS  --   --  22.6*  HGB 9.5* 9.9* 8.9*  HCT 29.1* 29.0* 26.7*  MCV 82.7  --  82.4  PLT 38*  --  27*    Basic Metabolic Panel Recent Labs    05/03/22 0411 05/03/22 1707 05/03/22 1846 05/04/22 0417  NA 135 136 138 138  K 4.0 3.7 3.5 3.3*  CL 101 103  --  106  CO2 24 23  --  24  GLUCOSE 197* 228*  --  207*  BUN 37* 29*  --  32*  CREATININE 2.55* 2.25*  --  1.90*  CALCIUM 7.3* 6.7*   --  6.5*  MG 2.2  --   --  2.1  PHOS 1.7* 3.3  --  2.1*    Liver Function Tests Recent Labs    05/01/22 2325 05/02/22 0410 05/03/22 1707 05/04/22 0417  AST 700*  --   --   --   ALT 439*  --   --   --   ALKPHOS 125  --   --   --   BILITOT 1.7*  --   --   --   PROT 4.5*  --   --   --   ALBUMIN 1.6*   < > 1.5* <1.5*   < > = values in this interval not displayed.    No results for input(s): "LIPASE", "AMYLASE" in the last 72 hours. Cardiac Enzymes No results for input(s): "CKTOTAL", "CKMB", "CKMBINDEX", "TROPONINI" in the last 72 hours.  Imaging   Pertinent imaging reviewed;  Echo 05/02/22: LVEF 20%, RV function moderately reduced.   Medications:     Scheduled Medications:  Chlorhexidine Gluconate Cloth  6 each Topical Daily   docusate  100 mg Per Tube BID   hydrocortisone sod succinate (SOLU-CORTEF) inj  100 mg Intravenous Q12H  insulin aspart  0-20 Units Subcutaneous Q4H   insulin aspart  4 Units Subcutaneous Q4H   mouth rinse  15 mL Mouth Rinse Q2H   pantoprazole (PROTONIX) IV  40 mg Intravenous Q24H   phosphorus  500 mg Per Tube BID   polyethylene glycol  17 g Per Tube Daily   sodium chloride flush  10-40 mL Intracatheter Q12H   sodium chloride flush  3 mL Intravenous Q12H    Infusions:   prismasol BGK 4/2.5 400 mL/hr at 05/04/22 0148    prismasol BGK 4/2.5 400 mL/hr at 05/04/22 0148   sodium chloride     sodium chloride 10 mL/hr at 05/04/22 1000   sodium chloride     sodium chloride 10 mL/hr at 05/02/22 1709   sodium chloride     dexmedetomidine (PRECEDEX) IV infusion 1.2 mcg/kg/hr (05/04/22 1000)   DOBUTamine 5 mcg/kg/min (05/04/22 1000)   fentaNYL infusion INTRAVENOUS Stopped (05/04/22 0704)   norepinephrine (LEVOPHED) Adult infusion 0 mcg/min (05/03/22 1712)   piperacillin-tazobactam Stopped (05/04/22 0916)   potassium PHOSPHATE IVPB (in mmol)     prismasol BGK 4/2.5 1,500 mL/hr at 05/04/22 0839   TPN ADULT (ION) 70 mL/hr at 05/04/22 1000   TPN  ADULT (ION)     vancomycin 200 mL/hr at 05/04/22 1000   vasopressin 0.03 Units/min (05/04/22 1000)    PRN Medications: sodium chloride, sodium chloride, acetaminophen, alum & mag hydroxide-simeth, fentaNYL, fentaNYL (SUBLIMAZE) injection, fentaNYL (SUBLIMAZE) injection, heparin, HYDROmorphone (DILAUDID) injection, midazolam, ondansetron (ZOFRAN) IV, mouth rinse, prochlorperazine, simethicone, sodium chloride, sodium chloride flush, sodium chloride flush    Patient Profile  51 y.o. AAF with history of bipolar disorder/schizophrenia, DM II, obesity, prior abdominal surgeries. Underwent robotic incisional hernia repair on 09/26.    Course c/b post-op ilieus/obstruction followed by septic shock 2/2 aspiration PNA, AKI on CRRT and cardiogenic shock.   Assessment/Plan  Cardiogenic & Septic shock -Septic shock 2/2 aspiration PNA. On Zosyn. Procal > 150 -Taken emergently to cath lab 10/4 d/t concern for ST elevation MI. HS troponin > 24K. Coronaries clean. CI 1.87, RA mean 13, PCWP 19, LVEDP 16.  - WBC continuing to rise although no increasing pressor requirement/fevers; possibly 2/2 steroids. -Blood cultures no growth.  - This morning (10/7), CVP at goal, mixed venous 63, CI 3. Femoral PA catheter w/ suboptimal waveforms, will D/C today due to infection risks. Obtain CVP/Mixed venous via PICC.    2. 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 Mixed venous improving. Continue inotropic support with dobutamine 59mg until extubated.    3. Acute MI (resolved) -ST elevation in inferolateral leads on ECG  -HS troponin > 24K at peak -Cath with normal coronaries   4. Metabolic acidosis: -Improved; continue CRRT   5. AKI: -Likely ATN in setting of septic shock -Continue CRRT. Nephrology following -Hemodynamic support as above    6. Acute hypoxic respiratory failure: -2/2 aspiration PNA -Vent management per CCM -On Zosyn & vanc   7. Incisional hernia: -s/p robotic repair with mesh/lysis of adhesions this admit   8. SBO -Post-op -Receiving TPN  9. Thrombocytopenia  - platelets continue to drop; SRA & HIT panel pending.     Length of Stay: 1[place] DO  05/04/2022, 10:26 AM  Advanced Heart Failure Team Pager [phone](M-F; 7a - 5p)  Please contact [facility] for night-coverage after hours (5p -  7a ) and weekends on [email or web address]  CRITICAL CARE Performed by: Hebert Soho   Total critical care time: 33 minutes  Critical care time was exclusive of separately billable procedures and treating other patients.  Critical care was necessary to treat or prevent imminent or life-threatening deterioration.  Critical care was time spent personally by me on the following activities: development of treatment plan with patient and/or surrogate as well as nursing, discussions with consultants, evaluation of patient's response to treatment, examination of patient, obtaining history from patient or surrogate, ordering and performing treatments and interventions, ordering and review of laboratory studies, ordering and review of radiographic studies, pulse oximetry and re-evaluation of patient's condition.

## 2022-05-04 NOTE — Progress Notes (Signed)
Patient ID: Patricia [name], female   DOB: 18-Apr-1971, 51 y.o.   MRN: [id number] [facility] Progress Note   Assessment/ Plan:   1.  Acute kidney injury: This appears to be likely from ATN associated with septic shock/sepsis.  Levophed weaned off overnight and remains on fixed dose vasopressin and dobutamine.  CVP 9 cmH2O this morning and I will keep her even from a fluid standpoint.  I will replace potassium and phosphorus. 2.  Anion gap metabolic acidosis: This appears to be largely from lactic acidosis associated with septic shock as well as acute kidney injury.  Correcting slowly with CRRT. 3.  Septic shock now with superimposed cardiogenic shock: Likely related to aspiration pneumonia versus acute abdominal event.  On inotropic support with dobutamine and antimicrobial coverage with vancomycin/Zosyn. 4.  Acute hypoxic respiratory failure: Secondary to septic shock/aspiration pneumonia event.  Ventilator management per CCM. 5.  Status post robotic incisional hernia repair with mesh/lysis of adhesions: Continue monitoring and management per surgical service.  Subjective:   No reported acute clinical events from overnight, tolerating CRRT with net even fluid balance (CVP this morning 9 cmH2O)   Objective:   BP 107/69   Pulse 69   Temp (!) 96.6 F (35.9 C)   Resp (!) 21   Ht '5\' 4"'$  (4.128 m)   Wt 114.9 kg   LMP 07/07/2014 (Approximate)   SpO2 (!) 81%   BMI 43.48 kg/m   Intake/Output Summary (Last 24 hours) at 05/04/2022 0758 Last data filed at 05/04/2022 0700 Gross per 24 hour  Intake 4190.85 ml  Output 4035 ml  Net 155.85 ml   Weight change: 2.7 kg  Physical Exam: Gen: Intubated with decreased sedation and intermittently awake on CRRT. CVS: Pulse regular, normal rate, S1 and S2 normal Resp: Anteriorly clear to auscultation, no distinct rales or rhonchi Abd: Soft, moderately distended, no bowel sounds audible.  Intact laparoscopic surgery incision sites Ext:  Extremities cool to touch, no ankle edema  Imaging: ECHOCARDIOGRAM LIMITED  Result Date: 05/03/2022    ECHOCARDIOGRAM LIMITED REPORT   Patient Name:   Patricia [name] Date of Exam: 05/02/2022 Medical Rec #:  [id number]          Height:       64.0 in Accession #:    [id number]         Weight:       241.0 lb Date of Birth:  05-01-1971          BSA:          2.118 m Patient Age:    7 years           BP:           117/79 mmHg Patient Gender: F                  HR:           114 bpm. Exam Location:  Inpatient Procedure: Limited Echo, Cardiac Doppler, Color Doppler and Intracardiac            Opacification Agent Indications:    I50.40* Unspecified combined systolic (congestive) and diastolic                 (congestive) heart failure  History:        Patient has prior history of Echocardiogram examinations, most                 recent 04/30/2022. Abnormal ECG, Arrythmias:Tachycardia,  Signs/Symptoms:Bacteremia and Altered Mental Status; Risk                 Factors:Hypertension and Diabetes.  Sonographer:    Roseanna Rainbow RDCS Referring Phys: Marlyce Huge, NICOLE  Sonographer Comments: Technically difficult study due to poor echo windows, patient is obese and echo performed with patient supine and on artificial respirator. Image acquisition challenging due to patient body habitus and Image acquisition challenging due to respiratory motion. IMPRESSIONS  1. Left ventricular ejection fraction, by estimation, is 20%. The left ventricle has severely decreased function. The left ventricle demonstrates global hypokinesis.  2. Right ventricular systolic function is moderately reduced. The right ventricular size is normal. Tricuspid regurgitation signal is inadequate for assessing PA pressure.  3. The mitral valve is grossly normal. Mild mitral valve regurgitation.  4. The aortic valve is grossly normal.  5. There is borderline dilatation of the ascending aorta, measuring 38 mm. FINDINGS  Left Ventricle: Left  ventricular ejection fraction, by estimation, is 20%. The left ventricle has severely decreased function. The left ventricle demonstrates global hypokinesis. Definity contrast agent was given IV to delineate the left ventricular endocardial borders. There is borderline left ventricular hypertrophy. Right Ventricle: The right ventricular size is normal. Right ventricular systolic function is moderately reduced. Tricuspid regurgitation signal is inadequate for assessing PA pressure. Pericardium: Presence of epicardial fat layer. Mitral Valve: The mitral valve is grossly normal. Mild mitral valve regurgitation. Aortic Valve: The aortic valve is grossly normal. Aorta: The aortic root is normal in size and structure. There is borderline dilatation of the ascending aorta, measuring 38 mm. LEFT VENTRICLE PLAX 2D LVIDd:         4.85 cm LVIDs:         4.25 cm LV PW:         0.95 cm LV IVS:        1.05 cm  LV Volumes (MOD) LV vol d, MOD A2C: 52.1 ml LV vol d, MOD A4C: 108.0 ml LV vol s, MOD A2C: 48.9 ml LV vol s, MOD A4C: 86.5 ml LV SV MOD A2C:     3.2 ml LV SV MOD A4C:     108.0 ml LV SV MOD BP:      13.2 ml LEFT ATRIUM         Index LA diam:    3.40 cm 1.61 cm/m   AORTA Ao Root diam: 3.30 cm Ao Asc diam:  3.80 cm Cherlynn Kaiser MD Electronically signed by Cherlynn Kaiser MD Signature Date/Time: 05/03/2022/7:44:53 AM    Final    DG CHEST PORT 1 VIEW  Result Date: 05/03/2022 CLINICAL DATA:  Septic shock.  Ventilator dependence. EXAM: PORTABLE CHEST 1 VIEW COMPARISON:  05/02/2022 FINDINGS: 0552 hours. Endotracheal tube tip is 3.2 cm above the base of the carina. Right IJ catheter tip overlies the mid to distal SVC level. Right-sided PICC line tip overlies the region of the right atrium. Inferiorly placed pulmonary artery catheter tip is in the left lower lobe pulmonary artery The NG tube passes into the stomach although the distal tip position is not included on the film. Left base atelectasis again noted. No evidence for  pneumothorax or substantial pleural effusion. The cardio pericardial silhouette is enlarged. IMPRESSION: 1. Stable exam. Left base atelectasis without new or progressive interval findings. Electronically Signed   By: Misty Stanley M.D.   On: 05/03/2022 06:14   DG Abd 1 View  Result Date: 05/02/2022 CLINICAL DATA:  Ileus. EXAM: ABDOMEN - 1  VIEW COMPARISON:  April 30, 2022. FINDINGS: Limited exam due to body habitus. No definite abnormal bowel dilatation is noted. Distal tip of nasogastric tube is seen in proximal stomach. IMPRESSION: Limited exam due to body habitus. No definite evidence of abnormal bowel dilatation. Electronically Signed   By: Marijo Conception M.D.   On: 05/02/2022 14:54   DG CHEST PORT 1 VIEW  Result Date: 05/02/2022 CLINICAL DATA:  CHF, placement of Swan-Ganz catheter EXAM: PORTABLE CHEST 1 VIEW COMPARISON:  Previous studies including the examination of 05/01/2022 FINDINGS: Tip of endotracheal tube is 3.9 cm above the carina. There is interval removal of left IJ central venous catheter. Right IJ central venous catheter is seen with its tip in superior vena cava. Tip of right PICC line is seen in the region of right atrium. Enteric tube is noted traversing the esophagus. There is interval placement of Swan-Ganz catheter through the lower extremity with its tip in the region of left lower lobe pulmonary artery. There are linear densities in left lower lung field. There are no signs of alveolar pulmonary edema or new focal infiltrates. There is no pleural effusion or pneumothorax. IMPRESSION: Tip of Swan-Ganz catheter is seen in the course of left lower lobe pulmonary artery. There are linear densities in left lower lung fields suggesting subsegmental atelectasis. Other support devices as described in the body of the report. Electronically Signed   By: Elmer Picker M.D.   On: 05/02/2022 08:53    Labs: BMET Recent Labs  Lab 05/01/22 0416 05/01/22 1600 05/01/22 2119  05/01/22 2325 05/02/22 0409 05/02/22 0410 05/02/22 1531 05/03/22 0411 05/03/22 1707 05/03/22 1846 05/04/22 0417  NA 133*  136 134*   < > 133* 134* 131* 136 135 136 138 138  K 3.4*  3.5 3.5   < > 3.4* 4.3 4.3 4.2 4.0 3.7 3.5 3.3*  CL 97*  97* 99  --  101  --  99 101 101 103  --  106  CO2 15*  16* 16*  --  15*  --  17* 19* 24 23  --  24  GLUCOSE 190*  191* 192*  --  240*  --  251* 234* 197* 228*  --  207*  BUN 71*  69* 60*  --  62*  --  51* 43* 37* 29*  --  32*  CREATININE 6.01*  6.13* 4.96*  --  4.93*  --  4.02* 3.01* 2.55* 2.25*  --  1.90*  CALCIUM 6.6*  6.6* 6.4*  --  5.5*  --  6.5* 7.7* 7.3* 6.7*  --  6.5*  PHOS 6.3* 5.5*  --   --   --  4.0 2.5 1.7* 3.3  --  2.1*   < > = values in this interval not displayed.   CBC Recent Labs  Lab 04/30/22 0037 04/30/22 0144 05/01/22 2325 05/02/22 0409 05/02/22 0410 05/03/22 0411 05/03/22 1846 05/04/22 0417  WBC 7.7   < > 6.9  --  7.9 19.0*  --  26.9*  NEUTROABS 5.8  --   --   --   --   --   --  22.6*  HGB 11.3*   < > 10.3*   < > 11.1* 9.5* 9.9* 8.9*  HCT 35.6*   < > 32.1*   < > 33.8* 29.1* 29.0* 26.7*  MCV 84.6   < > 86.1  --  84.1 82.7  --  82.4  PLT 437*   < > 61*  --  57*  38*  --  27*   < > = values in this interval not displayed.    Medications:     Chlorhexidine Gluconate Cloth  6 each Topical Daily   docusate  100 mg Per Tube BID   hydrocortisone sod succinate (SOLU-CORTEF) inj  100 mg Intravenous Q12H   insulin aspart  0-20 Units Subcutaneous Q4H   insulin aspart  4 Units Subcutaneous Q4H   mouth rinse  15 mL Mouth Rinse Q2H   pantoprazole (PROTONIX) IV  40 mg Intravenous Q24H   phosphorus  500 mg Per Tube BID   polyethylene glycol  17 g Per Tube Daily   sodium chloride flush  10-40 mL Intracatheter Q12H   sodium chloride flush  3 mL Intravenous Q12H   Elmarie Shiley, MD 05/04/2022, 7:58 AM

## 2022-05-04 NOTE — Progress Notes (Signed)
PHARMACY - TOTAL PARENTERAL NUTRITION CONSULT NOTE   Indication: Small bowel obstruction  Patient Measurements: Height: '5\' 4"'  (162.6 cm) Weight: 114.9 kg (253 lb 4.9 oz) IBW/kg (Calculated) : 54.7 TPN AdjBW (KG): 68.4 Body mass index is 43.48 kg/m.   Assessment: 51 yo female s/p difficult incisional hernia repair with long lysis of adhesion on 9/26. Who has developed a SBO based on CT scan 10/2.  Poor po intake since surgery  due to abdominal pain.  Pharmacy consulted to start TPN.  Patient intubated on 10/3 due to respiratory distress. Bowel function overnight, plans to hopefully start trickle feeds in the next 24 hours and potentially wean TPN off. Surgery recs holding tube feeds until pressor requirement decreased.   Glucose / Insulin: CBGs above goal but improved with rSSI and 15 units in TPN and now ~190s. Utilized 53 units / 24 hrs. SM started 10/2> HC 100 q12h - A1c 6.1 in Sept 2023 Electrolytes: Na 138, K 3.3 (4k CRRT bags), CoCa 8.5 (s/p 4g), phos 1.7-receiving Kphos 33molx1 now + Kphos 500 BID scheduled- Mg 2.1. Other electrolytes wnl.  Renal: CRRT 10/4> Hepatic: T bili: 1.7, AST/ALT 700/439. Alk phos wnl. TG 828 > 333 (propofol stopped 10/4) Intake / Output; MIVF: UOP 31 ml. CRRT 3.8L. LBM 10/6 (FMS) GI Imaging: 10/3 CT ab: continued SBO  10/2 CT Scan - SBO GI Surgeries / Procedures:  10/4: concern of STEMI s/p cath without occlusion  Central access: CVC triple lumen  TPN start date: 10/2  Nutritional Goals: Goal TPN rate is 70 mL/hr (provides 124 g of protein and 1848 kcals per day)  RD Assessment:  Estimated Needs Total Energy Estimated Needs: 1800-2000 kcal/d Total Protein Estimated Needs: 110-130 grams Total Fluid Estimated Needs: 1.8-2L/d  Current Nutrition:  NPO TPN - 20% lipids   Plan:  Continue concentrated TPN at new goal of 70 ml/hr to provide 124g AA and 1848 kcal to meet 100% of goals - adjusted based on change in lipid Add back lipids at 20% of  kcal. Follow up TG trend as appropriate to increase to 30%.  Electrolytes in TPN: Increase phos to 18 mmol/L.Na 100 mEq/L, K 0 mEq/L, Increase Ca 6 mEq/L, Mg 0 mEq/L. Adjust Cl:Ac 1:1  Add standard MVI and trace elements to TPN, chromium removed for CRRT  Continue to replace K outside of TPN (Kphos Neutral 500 mg BID + Kphos 390ml x1 today)  Continue resistant SSI as TPN is titrated to goal.  Increase regular insulin to 30 units in TPN Scheduled 4 units q4h per CCM  Monitor TPN labs on Mon/Thurs, and daily until stabilized   MaWilson SingerPharmD Clinical Pharmacist 05/04/2022 8:10 AM

## 2022-05-05 ENCOUNTER — Inpatient Hospital Stay (HOSPITAL_COMMUNITY): Payer: Medicaid Other

## 2022-05-05 DIAGNOSIS — R570 Cardiogenic shock: Secondary | ICD-10-CM | POA: Diagnosis not present

## 2022-05-05 DIAGNOSIS — I5021 Acute systolic (congestive) heart failure: Secondary | ICD-10-CM | POA: Diagnosis not present

## 2022-05-05 DIAGNOSIS — A419 Sepsis, unspecified organism: Secondary | ICD-10-CM | POA: Diagnosis not present

## 2022-05-05 DIAGNOSIS — R6521 Severe sepsis with septic shock: Secondary | ICD-10-CM | POA: Diagnosis not present

## 2022-05-05 DIAGNOSIS — N179 Acute kidney failure, unspecified: Secondary | ICD-10-CM

## 2022-05-05 LAB — COOXEMETRY PANEL
Carboxyhemoglobin: 1.5 % (ref 0.5–1.5)
Methemoglobin: <0.7 % (ref 0.0–1.5)
O2 Saturation: 72.2 %
Total hemoglobin: 9.2 g/dL — ABNORMAL LOW (ref 12.0–16.0)

## 2022-05-05 LAB — CBC WITH DIFFERENTIAL/PLATELET
Abs Immature Granulocytes: 0 10*3/uL (ref 0.00–0.07)
Basophils Absolute: 0 10*3/uL (ref 0.0–0.1)
Basophils Relative: 0 %
Eosinophils Absolute: 0 10*3/uL (ref 0.0–0.5)
Eosinophils Relative: 0 %
HCT: 27.8 % — ABNORMAL LOW (ref 36.0–46.0)
Hemoglobin: 9.4 g/dL — ABNORMAL LOW (ref 12.0–15.0)
Lymphocytes Relative: 2 %
Lymphs Abs: 0.7 10*3/uL (ref 0.7–4.0)
MCH: 28.2 pg (ref 26.0–34.0)
MCHC: 33.8 g/dL (ref 30.0–36.0)
MCV: 83.5 fL (ref 80.0–100.0)
Monocytes Absolute: 2.8 10*3/uL — ABNORMAL HIGH (ref 0.1–1.0)
Monocytes Relative: 8 %
Neutro Abs: 31.4 10*3/uL — ABNORMAL HIGH (ref 1.7–7.7)
Neutrophils Relative %: 90 %
Platelets: 39 10*3/uL — ABNORMAL LOW (ref 150–400)
RBC: 3.33 MIL/uL — ABNORMAL LOW (ref 3.87–5.11)
RDW: 15.7 % — ABNORMAL HIGH (ref 11.5–15.5)
WBC: 34.9 10*3/uL — ABNORMAL HIGH (ref 4.0–10.5)
nRBC: 2.2 % — ABNORMAL HIGH (ref 0.0–0.2)
nRBC: 5 /100 WBC — ABNORMAL HIGH

## 2022-05-05 LAB — RENAL FUNCTION PANEL
Albumin: <1.5 g/dL — ABNORMAL LOW (ref 3.5–5.0)
Anion gap: 11 (ref 5–15)
BUN: 35 mg/dL — ABNORMAL HIGH (ref 6–20)
CO2: 21 mmol/L — ABNORMAL LOW (ref 22–32)
Calcium: 7 mg/dL — ABNORMAL LOW (ref 8.9–10.3)
Chloride: 104 mmol/L (ref 98–111)
Creatinine, Ser: 1.9 mg/dL — ABNORMAL HIGH (ref 0.44–1.00)
GFR, Estimated: 31 mL/min — ABNORMAL LOW (ref >60)
Glucose, Bld: 214 mg/dL — ABNORMAL HIGH (ref 70–99)
Phosphorus: 2.4 mg/dL — ABNORMAL LOW (ref 2.5–4.6)
Potassium: 3.4 mmol/L — ABNORMAL LOW (ref 3.5–5.1)
Sodium: 136 mmol/L (ref 135–145)

## 2022-05-05 LAB — POCT I-STAT 7, (LYTES, BLD GAS, ICA,H+H)
Acid-Base Excess: 1 mmol/L (ref 0.0–2.0)
Bicarbonate: 24.2 mmol/L (ref 20.0–28.0)
Calcium, Ion: 1.03 mmol/L — ABNORMAL LOW (ref 1.15–1.40)
HCT: 30 % — ABNORMAL LOW (ref 36.0–46.0)
Hemoglobin: 10.2 g/dL — ABNORMAL LOW (ref 12.0–15.0)
O2 Saturation: 100 %
Patient temperature: 36.4
Potassium: 3.5 mmol/L (ref 3.5–5.1)
Sodium: 136 mmol/L (ref 135–145)
TCO2: 25 mmol/L (ref 22–32)
pCO2 arterial: 31.5 mmHg — ABNORMAL LOW (ref 32–48)
pH, Arterial: 7.491 — ABNORMAL HIGH (ref 7.35–7.45)
pO2, Arterial: 176 mmHg — ABNORMAL HIGH (ref 83–108)

## 2022-05-05 LAB — GLUCOSE, CAPILLARY
Glucose-Capillary: 125 mg/dL — ABNORMAL HIGH (ref 70–99)
Glucose-Capillary: 127 mg/dL — ABNORMAL HIGH (ref 70–99)
Glucose-Capillary: 163 mg/dL — ABNORMAL HIGH (ref 70–99)
Glucose-Capillary: 172 mg/dL — ABNORMAL HIGH (ref 70–99)
Glucose-Capillary: 195 mg/dL — ABNORMAL HIGH (ref 70–99)
Glucose-Capillary: 211 mg/dL — ABNORMAL HIGH (ref 70–99)

## 2022-05-05 LAB — COMPREHENSIVE METABOLIC PANEL
ALT: 248 U/L — ABNORMAL HIGH (ref 0–44)
AST: 331 U/L — ABNORMAL HIGH (ref 15–41)
Albumin: <1.5 g/dL — ABNORMAL LOW (ref 3.5–5.0)
Alkaline Phosphatase: 167 U/L — ABNORMAL HIGH (ref 38–126)
Anion gap: 9 (ref 5–15)
BUN: 34 mg/dL — ABNORMAL HIGH (ref 6–20)
CO2: 23 mmol/L (ref 22–32)
Calcium: 7 mg/dL — ABNORMAL LOW (ref 8.9–10.3)
Chloride: 106 mmol/L (ref 98–111)
Creatinine, Ser: 1.97 mg/dL — ABNORMAL HIGH (ref 0.44–1.00)
GFR, Estimated: 30 mL/min — ABNORMAL LOW (ref >60)
Glucose, Bld: 189 mg/dL — ABNORMAL HIGH (ref 70–99)
Potassium: 3.6 mmol/L (ref 3.5–5.1)
Sodium: 138 mmol/L (ref 135–145)
Total Bilirubin: 1.3 mg/dL — ABNORMAL HIGH (ref 0.3–1.2)
Total Protein: 4.6 g/dL — ABNORMAL LOW (ref 6.5–8.1)

## 2022-05-05 LAB — CULTURE, BLOOD (ROUTINE X 2)
Culture: NO GROWTH
Culture: NO GROWTH
Special Requests: ADEQUATE

## 2022-05-05 LAB — MAGNESIUM: Magnesium: 2.2 mg/dL (ref 1.7–2.4)

## 2022-05-05 MED ORDER — FENTANYL CITRATE PF 50 MCG/ML IJ SOSY: 50.0000 ug | PREFILLED_SYRINGE | Freq: Once | INTRAMUSCULAR | Status: AC

## 2022-05-05 MED ORDER — MIDAZOLAM HCL 2 MG/2ML IJ SOLN
INTRAMUSCULAR | Status: AC
  Administered 2022-05-05: 2 mg
  Filled 2022-05-05: qty 2

## 2022-05-05 MED ORDER — SUCCINYLCHOLINE CHLORIDE 200 MG/10ML IV SOSY
PREFILLED_SYRINGE | INTRAVENOUS | Status: AC
  Filled 2022-05-05: qty 10

## 2022-05-05 MED ORDER — FENTANYL CITRATE PF 50 MCG/ML IJ SOSY
PREFILLED_SYRINGE | INTRAMUSCULAR | Status: AC
  Administered 2022-05-05: 50 ug
  Filled 2022-05-05: qty 2

## 2022-05-05 MED ORDER — MIDAZOLAM HCL 2 MG/2ML IJ SOLN: 2.0000 mg | Freq: Once | INTRAMUSCULAR | Status: AC

## 2022-05-05 MED ORDER — MIDAZOLAM HCL 2 MG/2ML IJ SOLN
INTRAMUSCULAR | Status: AC
  Filled 2022-05-05: qty 2

## 2022-05-05 MED ORDER — ETOMIDATE 2 MG/ML IV SOLN: 10.0000 mg | Freq: Once | INTRAVENOUS | Status: AC

## 2022-05-05 MED ORDER — POTASSIUM CHLORIDE 10 MEQ/50ML IV SOLN
10.0000 meq | INTRAVENOUS | Status: AC
  Administered 2022-05-05 (×2): 10 meq via INTRAVENOUS
  Filled 2022-05-05: qty 50

## 2022-05-05 MED ORDER — ROCURONIUM BROMIDE 50 MG/5ML IV SOLN
50.0000 mg | Freq: Once | INTRAVENOUS | Status: AC
  Filled 2022-05-05: qty 5

## 2022-05-05 MED ORDER — KETAMINE HCL 50 MG/5ML IJ SOSY
PREFILLED_SYRINGE | INTRAMUSCULAR | Status: AC
  Filled 2022-05-05: qty 5

## 2022-05-05 MED ORDER — FENTANYL CITRATE PF 50 MCG/ML IJ SOSY
PREFILLED_SYRINGE | INTRAMUSCULAR | Status: AC
  Filled 2022-05-05: qty 2

## 2022-05-05 MED ORDER — ROCURONIUM BROMIDE 10 MG/ML (PF) SYRINGE
PREFILLED_SYRINGE | INTRAVENOUS | Status: AC
  Administered 2022-05-05: 50 mg
  Filled 2022-05-05: qty 10

## 2022-05-05 MED ORDER — TRACE MINERALS CU-MN-SE-ZN 300-55-60-3000 MCG/ML IV SOLN
INTRAVENOUS | Status: AC
  Filled 2022-05-05: qty 828.8

## 2022-05-05 MED ORDER — ETOMIDATE 2 MG/ML IV SOLN
INTRAVENOUS | Status: AC
  Administered 2022-05-05: 20 mg
  Filled 2022-05-05: qty 20

## 2022-05-05 MED ORDER — ETOMIDATE 2 MG/ML IV SOLN
INTRAVENOUS | Status: AC
  Filled 2022-05-05: qty 20

## 2022-05-05 MED ORDER — ROCURONIUM BROMIDE 10 MG/ML (PF) SYRINGE
PREFILLED_SYRINGE | INTRAVENOUS | Status: AC
  Filled 2022-05-05: qty 10

## 2022-05-05 NOTE — Progress Notes (Signed)
Advanced Heart Failure Rounding Note  PCP-Cardiologist: None   Subjective:    Remains on vent. Sedated  DBA 5. NE off. Co-ox 72% HR creeping up 70-80 -> 100-110 (sinus)  Anuric. On cvvhd keeping even. CVP 11-12  WBC 19K- > 27k -> 35K   PLTs 27k-> 39k   Objective:   Weight Range: 115 kg Body mass index is 43.52 kg/m.   Vital Signs:   Temp:  [96.6 F (35.9 C)-98.5 F (36.9 C)] 98 F (36.7 C) (10/08 0700) Pulse Rate:  [66-114] 98 (10/08 0945) Resp:  [17-33] 26 (10/08 1015) BP: (118)/(76-77) 118/77 (10/08 0840) SpO2:  [89 %-100 %] 98 % (10/08 0945) Arterial Line BP: (98-144)/(64-94) 98/64 (10/08 1015) FiO2 (%):  [40 %] 40 % (10/08 0840) Weight:  [856 kg] 115 kg (10/08 0500) Last BM Date : 05/04/22  Weight change: Filed Weights   05/03/22 0332 05/04/22 0500 05/05/22 0500  Weight: 112.2 kg 114.9 kg 115 kg    Intake/Output:   Intake/Output Summary (Last 24 hours) at 05/05/2022 1106 Last data filed at 05/05/2022 1000 Gross per 24 hour  Intake 3010.86 ml  Output 2876 ml  Net 134.86 ml       Physical Exam   General:  Intubated/sedated HEENT: normal + ETT Neck: supple. JVP hard to see. Carotids 2+ bilat; no bruits. No lymphadenopathy or thryomegaly appreciated. Cor: PMI nondisplaced. Regular tachy No rubs, gallops or murmurs. Lungs: clear Abdomen: obese soft, nontender, nondistended. Hypoactive BS  Extremities: no cyanosis, clubbing, rash, 2+ edema Neuro: intubated sedated    Telemetry   SR 80s personally checked.    Labs    CBC Recent Labs    05/04/22 0417 05/04/22 1700 05/05/22 0426  WBC 26.9*  --  34.9*  NEUTROABS 22.6*  --  31.4*  HGB 8.9* 17.3* 9.4*  HCT 26.7* 51.0* 27.8*  MCV 82.4  --  83.5  PLT 27*  --  39*    Basic Metabolic Panel Recent Labs    05/04/22 0417 05/04/22 1700 05/04/22 1905 05/05/22 0614  NA 138   < > 135 138  K 3.3*   < > 3.7 3.6  CL 106  --  103 106  CO2 24  --  22 23  GLUCOSE 207*  --  216* 189*  BUN  32*  --  35* 34*  CREATININE 1.90*  --  1.89* 1.97*  CALCIUM 6.5*  --  6.8* 7.0*  MG 2.1  --   --  2.2  PHOS 2.1*  --  2.7  --    < > = values in this interval not displayed.    Liver Function Tests Recent Labs    05/04/22 1905 05/05/22 0614  AST  --  331*  ALT  --  248*  ALKPHOS  --  167*  BILITOT  --  1.3*  PROT  --  4.6*  ALBUMIN 1.5* <1.5*    No results for input(s): "LIPASE", "AMYLASE" in the last 72 hours. Cardiac Enzymes No results for input(s): "CKTOTAL", "CKMB", "CKMBINDEX", "TROPONINI" in the last 72 hours.  Imaging   Pertinent imaging reviewed;  Echo 05/02/22: LVEF 20%, RV function moderately reduced.   Medications:     Scheduled Medications:  Chlorhexidine Gluconate Cloth  6 each Topical Daily   docusate  100 mg Per Tube BID   hydrocortisone sod succinate (SOLU-CORTEF) inj  100 mg Intravenous Q12H   insulin aspart  0-20 Units Subcutaneous Q4H   insulin aspart  7 Units Subcutaneous  Q4H   mouth rinse  15 mL Mouth Rinse Q2H   pantoprazole (PROTONIX) IV  40 mg Intravenous Q24H   phosphorus  500 mg Per Tube BID   polyethylene glycol  17 g Per Tube Daily   sodium chloride flush  10-40 mL Intracatheter Q12H   sodium chloride flush  3 mL Intravenous Q12H    Infusions:   prismasol BGK 4/2.5 400 mL/hr at 05/04/22 2342    prismasol BGK 4/2.5 400 mL/hr at 05/04/22 2342   sodium chloride     sodium chloride Stopped (05/04/22 2003)   sodium chloride     sodium chloride 10 mL/hr at 05/02/22 1709   sodium chloride     dexmedetomidine (PRECEDEX) IV infusion 0.2 mcg/kg/hr (05/05/22 1047)   DOBUTamine 5 mcg/kg/min (05/05/22 1000)   fentaNYL infusion INTRAVENOUS Stopped (05/05/22 0837)   midazolam 4 mg/hr (05/05/22 1000)   norepinephrine (LEVOPHED) Adult infusion 0 mcg/min (05/03/22 1712)   piperacillin-tazobactam Stopped (05/05/22 0921)   prismasol BGK 4/2.5 1,500 mL/hr at 05/05/22 0832   TPN ADULT (ION) 70 mL/hr at 05/05/22 1000   TPN ADULT (ION)      vancomycin 200 mL/hr at 05/05/22 1000   vasopressin Stopped (05/04/22 1855)    PRN Medications: sodium chloride, sodium chloride, acetaminophen, alum & mag hydroxide-simeth, fentaNYL, fentaNYL (SUBLIMAZE) injection, fentaNYL (SUBLIMAZE) injection, HYDROmorphone (DILAUDID) injection, midazolam, ondansetron (ZOFRAN) IV, mouth rinse, prochlorperazine, simethicone, sodium chloride, sodium chloride flush, sodium chloride flush    Patient Profile  51 y.o. AAF with history of bipolar disorder/schizophrenia, DM II, obesity, prior abdominal surgeries. Underwent robotic incisional hernia repair on 09/26.    Course c/b post-op ilieus/obstruction followed by septic shock 2/2 aspiration PNA, AKI on CRRT and cardiogenic shock.   Assessment/Plan  Cardiogenic & Septic shock -Septic shock 2/2 aspiration PNA. Procal > 150 -Taken emergently to cath lab 10/4 d/t concern for ST elevation MI. HS troponin > 24K. Coronaries clean. CI 1.87, RA mean 13, PCWP 19, LVEDP 16.  - WBC continuing to rise (now 34K) although no increasing pressor requirement/fevers; possibly 2/2 steroids but high-co-ox and developing tachycardia concerning for recurreing sepsis.  -Blood cultures 10/3 negative - CCM expanding abx    2. Acute systolic CHF -No prior cardiac history -Echo 10/03: EF 30-35%, RV moderately reduced -Bedside echo 10/04 at time of ECG changes: EF 15% - Remains on DBA 5  Co-ox 72%.  - Volume status up (weight up 12 pounds) but will not push volume removal until hemodynamics more stable  3. Acute MI (resolved) -ST elevation in inferolateral leads on ECG  -HS troponin > 24K at peak -Cath with normal coronaries   4. Metabolic acidosis: -Improved; continue CRRT   5. AKI: -Likely ATN in setting of septic shock -Remains anuric. Continue CRRT. Keeping even. Nephrology following -Weight up would start pulling volume as hemodynamics tolerate   6. Acute hypoxic respiratory failure: -2/2 aspiration PNA -Vent  and abx management per CCM   7. Incisional hernia: -s/p robotic repair with mesh/lysis of adhesions this admit - GSU following   8. SBO -Post-op -Receiving TPN  9. Thrombocytopenia  - platelets continue to drop; HIT panel negative. SRA pending    Length of Stay: Ashley, MD  05/05/2022, 11:06 AM  Advanced Heart Failure Team Pager [phone] (M-F; 7a - 5p)  Please contact [facility] for night-coverage after hours (5p -7a ) and weekends on [email or web address]  CRITICAL CARE Performed by: Glori Bickers   Total critical care time: 40 minutes  Critical care time was exclusive of separately billable procedures and treating other patients.  Critical care was necessary to treat or prevent imminent or life-threatening deterioration.  Critical care was time spent personally by me on the following activities: development of treatment plan with patient and/or surrogate as well as nursing, discussions with consultants, evaluation of patient's response to treatment, examination of patient, obtaining history from patient or surrogate, ordering and performing treatments and interventions, ordering and review of laboratory studies, ordering and review of radiographic studies, pulse oximetry and re-evaluation of patient's condition.

## 2022-05-05 NOTE — Progress Notes (Signed)
Patient ID: Patricia [name], female   DOB: [birth year], 51 y.o.   MRN: [id number] [facility] Progress Note   Assessment/ Plan:   1.  Acute kidney injury: Likely ATN associated with septic shock/sepsis.  Pressors weaned off and remains on inotropic support with dobutamine.  CVP 7-9 cmH2O this morning and we will continue the current CRRT prescription with net even fluid balance.  She remains anuric. 2.  Anion gap metabolic acidosis: Secondary to acute kidney injury and lactic acidosis from septic shock; improving/resolved with CRRT. 3.  Septic shock now with superimposed cardiogenic shock: Likely related to aspiration pneumonia with lower likelihood of acute abdominal event based on surgical evaluation.  On inotropic support with dobutamine and antimicrobial coverage with vancomycin/Zosyn. 4.  Acute hypoxic respiratory failure: Secondary to septic shock/aspiration pneumonia event.  Ventilator management per CCM. 5.  Status post robotic incisional hernia repair with mesh/lysis of adhesions: Continue monitoring and management per surgical service.  Subjective:   Weaned well from a respiratory standpoint yesterday and overnight with increased sedation requirements.   Objective:   BP 118/76   Pulse (!) 106   Temp (!) 97.3 F (36.3 C) (Axillary)   Resp (!) 21   Ht '5\' 4"'$  (1.626 m)   Wt 115 kg   LMP 07/07/2014 (Approximate)   SpO2 100%   BMI 43.52 kg/m   Intake/Output Summary (Last 24 hours) at 05/05/2022 0757 Last data filed at 05/05/2022 0700 Gross per 24 hour  Intake 3468.35 ml  Output 3099 ml  Net 369.35 ml   Weight change: 0.1 kg  Physical Exam: Gen: Intubated, appears comfortable on CRRT CVS: Pulse regular, normal rate, S1 and S2 normal Resp: Anteriorly clear to auscultation, no distinct rales or rhonchi Abd: Soft, moderately distended, no bowel sounds audible.  Intact laparoscopic surgery incision sites Ext: Extremities cool to touch, no ankle  edema  Imaging: DG CHEST PORT 1 VIEW  Result Date: 05/05/2022 CLINICAL DATA:  [id number] with ventilator dependent respiratory failure. EXAM: PORTABLE CHEST 1 VIEW COMPARISON:  Portable chest yesterday at 3:57 p.m. FINDINGS: 6:06 a.m. ETT tip is 3.7 cm from the carina. NGT is coiled in the stomach with the tip not seen. Right PICC terminates at about the superior cavoatrial junction, with right IJ double-lumen catheter terminating in the distal SVC as before. There are low lung volumes again noted with atelectatic bands in the left base and right infrahilar consolidation or atelectasis. Remainder of the lungs are clear with overall aeration unchanged. The sulci are sharp. There is cardiomegaly without CHF findings with stable mediastinal configuration. IMPRESSION: Low lung volumes with again noted right infrahilar consolidation or atelectasis and left basilar atelectatic bands. Stable overall aeration. Cardiomegaly. Electronically Signed   By: Telford Nab M.D.   On: 05/05/2022 07:29   DG CHEST PORT 1 VIEW  Result Date: 05/04/2022 CLINICAL DATA:  Cough.  OG tube present. EXAM: PORTABLE CHEST 1 VIEW COMPARISON:  05/04/2022 and prior radiographs FINDINGS: Endotracheal tube with tip 3.5 cm above the carina, RIGHT IJ central venous catheter with tip overlying the mid SVC, enteric tube with tip overlying the mid stomach, and catheter with tip overlying the LEFT main pulmonary artery again noted. This is a mildly low volume study with mild bibasilar atelectasis identified. There is no evidence of pneumothorax or large pleural effusion. IMPRESSION: 1. Unchanged appearance of the chest with support apparatus as described. 2. Mild bibasilar atelectasis. Electronically Signed   By: Margarette Canada M.D.   On: 05/04/2022 16:18  [place] EKG SITE RITE  Result Date: 05/04/2022 If Site Rite image not attached, placement could not be confirmed due to current cardiac rhythm.  DG CHEST PORT 1 VIEW  Result Date:  05/04/2022 CLINICAL DATA:  Acute congestive heart failure EXAM: PORTABLE CHEST 1 VIEW COMPARISON:  Chest x-ray May 03, 2022 FINDINGS: Slight retraction of the inferiorly placed pulmonary arterial catheter, with the tip projecting over the left main pulmonary artery. Otherwise, stable support apparatus. Unchanged mediastinal contours and cardiomegaly. Bibasilar linear opacities, likely atelectasis. Small left pleural effusion. No large pneumothorax. No acute osseous abnormality. The visualized upper abdomen is unremarkable. IMPRESSION: 1. Slight retraction of the pulmonary artery catheter, with the tip projecting over the left main pulmonary artery. 2. Bibasilar atelectasis and small right pleural effusion. Electronically Signed   By: Beryle Flock M.D.   On: 05/04/2022 10:34    Labs: BMET Recent Labs  Lab 05/01/22 1600 05/01/22 2119 05/02/22 0410 05/02/22 1531 05/03/22 0411 05/03/22 1707 05/03/22 1846 05/04/22 0417 05/04/22 1700 05/04/22 1905 05/05/22 0614  NA 134*   < > 131* 136 135 136 138 138 136 135 138  K 3.5   < > 4.3 4.2 4.0 3.7 3.5 3.3* 3.7 3.7 3.6  CL 99   < > 99 101 101 103  --  106  --  103 106  CO2 16*   < > 17* 19* 24 23  --  24  --  22 23  GLUCOSE 192*   < > 251* 234* 197* 228*  --  207*  --  216* 189*  BUN 60*   < > 51* 43* 37* 29*  --  32*  --  35* 34*  CREATININE 4.96*   < > 4.02* 3.01* 2.55* 2.25*  --  1.90*  --  1.89* 1.97*  CALCIUM 6.4*   < > 6.5* 7.7* 7.3* 6.7*  --  6.5*  --  6.8* 7.0*  PHOS 5.5*  --  4.0 2.5 1.7* 3.3  --  2.1*  --  2.7  --    < > = values in this interval not displayed.   CBC Recent Labs  Lab 04/30/22 0037 04/30/22 0144 05/02/22 0410 05/03/22 0411 05/03/22 1846 05/04/22 0417 05/04/22 1700 05/05/22 0426  WBC 7.7   < > 7.9 19.0*  --  26.9*  --  34.9*  NEUTROABS 5.8  --   --   --   --  22.6*  --  31.4*  HGB 11.3*   < > 11.1* 9.5* 9.9* 8.9* 17.3* 9.4*  HCT 35.6*   < > 33.8* 29.1* 29.0* 26.7* 51.0* 27.8*  MCV 84.6   < > 84.1 82.7  --   82.4  --  83.5  PLT 437*   < > 57* 38*  --  27*  --  39*   < > = values in this interval not displayed.    Medications:     Chlorhexidine Gluconate Cloth  6 each Topical Daily   docusate  100 mg Per Tube BID   hydrocortisone sod succinate (SOLU-CORTEF) inj  100 mg Intravenous Q12H   insulin aspart  0-20 Units Subcutaneous Q4H   insulin aspart  7 Units Subcutaneous Q4H   mouth rinse  15 mL Mouth Rinse Q2H   pantoprazole (PROTONIX) IV  40 mg Intravenous Q24H   phosphorus  500 mg Per Tube BID   polyethylene glycol  17 g Per Tube Daily   sodium chloride flush  10-40 mL Intracatheter Q12H   sodium  chloride flush  3 mL Intravenous Q12H   Elmarie Shiley, MD 05/05/2022, 7:57 AM

## 2022-05-05 NOTE — Progress Notes (Addendum)
PHARMACY - TOTAL PARENTERAL NUTRITION CONSULT NOTE   Indication: Small bowel obstruction  Patient Measurements: Height: _0  (162.6 cm) Weight: 115 kg (253 lb 8.5 oz) IBW/kg (Calculated) : 54.7 TPN AdjBW (KG): 68.4 Body mass index is 43.52 kg/m.   Assessment: 51 yo female s/p difficult incisional hernia repair with long lysis of adhesion on 9/26. Who has developed a SBO based on CT scan 10/2.  Poor po intake since surgery  due to abdominal pain.  Pharmacy consulted to start TPN.  Patient intubated on 10/3 due to respiratory distress. Surgery recs holding tube feeds until pressor requirement decreased.   Glucose / Insulin: CBGs above goal but improved with rSSI and 15 units in TPN and now ~190s. Utilized 66 units / 24 hrs. SM started 10/2> HC 100 q12h - A1c 6.1 in Sept 2023 Electrolytes: Na 138, K 3.6 (4k CRRT bags), CoCa 8.5 (s/p 4g), phos 1.7-s/p Kphos 99molx1 + Kphos 500 BID scheduled- Mg 2.2. Other electrolytes wnl.  Renal: CRRT 10/4> Hepatic: T bili: 1.7, AST/ALT 331/248. Alk phos 167. TG 828 > 333 (propofol stopped 10/4) Intake / Output; MIVF: UOP 15 ml. CRRT 2.7L. LBM 10/8 (FMS) GI Imaging: 10/3 CT ab: continued SBO  10/2 CT Scan - SBO GI Surgeries / Procedures:  10/4: concern of STEMI s/p cath without occlusion  Central access: CVC triple lumen  TPN start date: 10/2  Nutritional Goals: Goal TPN rate is 70 mL/hr (provides 124 g of protein and 1848 kcals per day)  RD Assessment:  Estimated Needs Total Energy Estimated Needs: 1800-2000 kcal/d Total Protein Estimated Needs: 110-130 grams Total Fluid Estimated Needs: 1.8-2L/d  Current Nutrition:  NPO TPN - 20% lipids   Plan:  Continue concentrated TPN at new goal of 70 ml/hr to provide 124g AA and 1848 kcal to meet 100% of goals - adjusted based on change in lipid Add back lipids at 20% of kcal. Follow up TG trend as appropriate to increase to 30%.  Electrolytes in TPN: phos to 18 mmol/L.Na 100 mEq/L, K 0 mEq/L, Ca  6 mEq/L, Mg 0 mEq/L. Cl:Ac 1:2 Add standard MVI and trace elements to TPN, chromium removed for CRRT  Continue to replace K outside of TPN (Kphos Neutral 500 mg BID) Continue resistant SSI q4h Increase regular insulin to 40 units in TPN Scheduled 7 units q4h per CCM (inc 10/7 PM) Monitor TPN labs on Mon/Thurs, and daily until stabilized F/u trigs tomorrow AM   MWilson Singer PharmD Clinical Pharmacist 05/05/2022 8:44 AM

## 2022-05-05 NOTE — Progress Notes (Signed)
NAME:  Patricia [name], MRN:  [id number], DOB:  02-Jun-1971, LOS: 46 ADMISSION DATE:  04/23/2022, CONSULTATION DATE:  04/29/2022 REFERRING MD:  Felicie Morn, MD, CHIEF COMPLAINT:   Small bowel Obstruction, aspiration event, Sepsis, Respiratory Failure    History of Present Illness:  Patricia [name] with above hx and nuc study in 2015 neg for ischemia done for chest pain and echo with EF 60-65% presented to hospital for excisional hernia repair (robotic hernia repair 04/23/22) with mesh, ileus post op on TNA. Developed vomiting and hypotension after NG came out on 04/29/22 and PNA on CXR, was tachycardic and BP improved with IV fluids. DX with sepsis and ABX started. At one point prolonged Qtc. Pt admitted to ICU on 10/2.  Was placed on levophed. Early AM on 10/3 was intubated for worsening metabolic/lactic acidosis. She had AKI as well. Crt was 6.01, her normal 0.9. Today Cr is 4 on CRRT.  Pertinent  Medical History  DM2 Depression OSA Migraines  Asthma Obesity Schizophrenia HTN HLD  Significant Hospital Events: Including procedures, antibiotic start and stop dates in addition to other pertinent events   9/26 Robotic hernia repair complicated by abd adhesions.  10/2 CT + for developing SBO and bilat lower lobe opacities most likely aspiration PNA. Progressive decompensation requiring transfer to ICU 10\3 Intubated for resp failure. CVL and arterial line placed. 04/30/22: EF 30-35%, RV moderately reduced, mild to moderate MR, IVC collapsed throughout respiratory cycle 10\4 EKG changes/trop increased--> and taken to cath lab - No obstructive CAD, but in decompensated heart failure, Bedside echo with EF 15%, new WMA, moderate RV dysfunction, trivial pericardial effusion.underwent emergent L/RHC. Normal coronaries, RA mean 13, PA mean 29, LVEDP 16, PCWP 19, CO/CI 3.96/1.87   returned to CICU post-cath. Right heart cath placed right fem. Right IJ HD cath placed.  10/5 advanced HF team  following, shock supported on norepi/vasopressin and Dobutamine. On CRRT.  10/6 weaning Norepi. Starting to wean fent. Needing restraints.   Interim History / Subjective:  More comfortable with less dyssynchrony on PSV.  Off all sedation.  Vasopressin off.  On low-dose dobutamine only.  Objective   Blood pressure 118/77, pulse 93, temperature 98.2 F (36.8 C), temperature source Axillary, resp. rate (!) 29, height '5\' 4"'$  (1.626 m), weight 115 kg, last menstrual period 07/07/2014, SpO2 94 %. PAP: (29-42)/(18-28) 29/19 CVP:  [0 mmHg-11 mmHg] 8 mmHg  Vent Mode: PSV;CPAP FiO2 (%):  [40 %] 40 % Set Rate:  [24 bmp] 24 bmp Vt Set:  [430 mL] 430 mL PEEP:  [5 cmH20] 5 cmH20 Pressure Support:  [8 cmH20] 8 cmH20 Plateau Pressure:  [24 cmH20] 24 cmH20   Intake/Output Summary (Last 24 hours) at 05/05/2022 1304 Last data filed at 05/05/2022 1200 Gross per 24 hour  Intake 2940.21 ml  Output 2475 ml  Net 465.21 ml    Filed Weights   05/03/22 0332 05/04/22 0500 05/05/22 0500  Weight: 112.2 kg 114.9 kg 115 kg    Examination: General 51 year old female pt currently sedated on vent, obese HENT NCAT no JVD MMM, right IJ HD cath unremarkable  Pulm: clear to auscultation bilaterally. Will tolerate PSV down to 8/5 with acceptable tidal volumes. Card: extremities warm without edema. HS distant. Hemodynamics reviewed. Abd soft. Dressing intact. Minimal NG drainage. Bowel sounds are active.  Gu min UOP  Neuro awakens to voice.  Gagging on endotracheal tube.  Ancillary tests personally reviewed  Marked leukocytosis persists  Negative HIT assay Assessment & Plan:  Circulatory shock. Mixed picture of Septic Shock/MODS/lactic acidosis (2/2 aspiration PNA) and acute decompensated biventricular HF (suspect septic CM given clean coronaries; EF 15% RV w/mod dysfxn)   Cont to aim to keep her euvolemic (CVP goal ~10) Dobutamine per HF team  Day 5 zosyn, stop date TBD  Acute hypoxic respiratory Failure  2/2 aspiration pneumonia and compensation to metabolic acidosis  Trial of extubation today as has weaned adequately and will require resedation for tube tolerance.  AKI w/ AGMA 2/2 lactic acidosis all 2/2 shock  Continue CRRT - run even as not clinically volume overloaded Renal dose meds Am chem   Acute metabolic encephalopathy superimposed on h/o bipolar disease and schizophrenia   Stop all sedative infusions  S/p hernia repair c/b ileus vs SBO  Continued bowel rest per General Surgery   Thrombocytopenia, likely sepsis related.  HIT assay negative  Doppler US legs to rule out consumptive thrombocytopenia  Hyperglycemia w/ h/o type II DM - uncontrolled on TPN  Blood glucose currently well controlled  Best Practice (right click and "Reselect all SmartList Selections" daily)   Diet/type: TPN DVT prophylaxis: prophylactic heparin  GI prophylaxis: PPI Lines: Central line Foley:  Yes, and it is still needed Code Status:  full code  CRITICAL CARE Performed by: Kipp Brood   Total critical care time: 35 minutes  Critical care time was exclusive of separately billable procedures and treating other patients.  Critical care was necessary to treat or prevent imminent or life-threatening deterioration.  Critical care was time spent personally by me on the following activities: development of treatment plan with patient and/or surrogate as well as nursing, discussions with consultants, evaluation of patient's response to treatment, examination of patient, obtaining history from patient or surrogate, ordering and performing treatments and interventions, ordering and review of laboratory studies, ordering and review of radiographic studies, pulse oximetry, re-evaluation of patient's condition and participation in multidisciplinary rounds.  Kipp Brood, MD [facility] ICU Physician [place]  Pager: [phone] Mobile: [phone] After hours:  [phone].

## 2022-05-05 NOTE — Procedures (Signed)
Extubation Procedure Note  Patient Details:   Name: Patricia [name] DOB: 18-May-1971 MRN: [id number]   Airway Documentation:    Vent end date: 05/05/22 Vent end time: 1355   Evaluation  O2 sats: stable throughout Complications: No apparent complications Patient did tolerate procedure well. Bilateral Breath Sounds: Clear, Diminished   Yes  Pt extubated per physician order. Pt suctioned via ETT and orally prior, positive cuff leak heard. Upon extubation pt with good cough, no stridor and not willing to speak to staff. Pt on 4L nasal cannula. MD to pt bedside. Verbal order received to place pt on bipap at this time. Pt placed on bipap briefly before taking back off and placing on nasal cannula. Pt with improved work of breathing off bipap at this time. Pt remains on 6L nasal canula. RT will continue to monitor and be available as needed.   Jonathon Jordan [place] R[place]son 05/05/2022, 2:20 PM

## 2022-05-05 NOTE — Progress Notes (Signed)
Patient remained dyssynchronous on PRVC overnight. Was switched to PSV in the morning which she tolerated all morning. She somnolent but cooperative so an attempt was made to extubate, but she remained tachypneic and became less responsive, so she was reintubated.   Kipp Brood, MD [facility] ICU Physician [place]  Pager: [phone] Or Epic Secure Chat After hours: [phone].  05/05/2022, 7:06 PM

## 2022-05-05 NOTE — Progress Notes (Signed)
Patricia [name] [id number] 25-Jan-1971  CARE TEAM:  PCP: [name], MD  Outpatient Care Team: Patient Care Team: [name], MD as PCP - General (Family Medicine)  Inpatient Treatment Team: Treatment Team: Attending Provider: Stechschulte, Nickola Major, MD; Rounding Team: Pccm, Md, MD; Consulting Physician: Elmarie Shiley, MD; Consulting Physician: Lbcardiology, Michae Kava, MD; Rounding Team: Lbcardiology, Michae Kava, MD; Case Manager: Rozell Searing, RN; Rounding Team: Edison Pace, Md, MD; Utilization Review: Antionette Char, RN; Registered Nurse: Sherry Ruffing, RN; Social Worker: Letha Cape; Case Manager: Gerrit Heck, RN   Problem List:   Principal Problem:   Septic shock [facility]) Active Problems:   Diabetes mellitus type 2 in obese ([place])   BIPOLAR DISORDER UNSPECIFIED   Morbid obesity ([place])   Chronic low back pain   Hernia of abdominal cavity   Aspiration pneumonia ([place])   Prolonged QT interval   Essential hypertension   Date of Surgery: 04/23/2022    Preoperative Diagnosis: Right lower abdomen spagelian hernia    Postoperative Diagnosis: Lower abdominal incisional hernia 6cm tall by 9 cm wide with right lower abdomen spagelian hernia component    Surgical Procedure: ROBOTIC INCISIONAL HERNIA REPAIR WITH MESH:  INSERTION OF MESH: 96283 (CPT)  Robotic lysis of adhesions (1 hour)   Operative Team Members:  Surgeon(s) and Role:   * Stechschulte, Nickola Major, MD - Primary    4 Days Post-Op  05/01/2022  Procedure(s): Coronary/Graft Acute MI Revascularization LEFT HEART CATH AND CORONARY ANGIOGRAPHY RIGHT/LEFT HEART CATH AND CORONARY ANGIOGRAPHY    Assessment  GUARDED  [facility] Stay = 11 days)  MULTISPECIALTY CARE (Pulmonary/CCM, Cardiology, Nephrology, etc) APPRECIATED  S/P LOA & incisional hernia repair 04/23/2022 Dr Lanny Hurst -postop ileus resolving complicated by aspiration -NGT to LIWS for ileus  -TPN for primary nutrition -hold  on TFs until shock resolved per Dr Lanny Hurst -If no plan to extubate in the next day or so, can consider trophic tube feeds (<48m/hr) since she is off major pressors at this time.  Otherwise hold off and see if she is extubatable first    Cardiogenic/septic shock  -on abx for pulmonary coverage for presumed aspiration and pneumonitis. -cath 10/4 -  acute decompensated biventricular HF (suspect septic CM given clean coronaries; EF 15% RV w/mod dysfxn)  -cardiology input -patient weaning off most pressors now.  Just on dobutamine. -EF improved with therapy   AKI -CRRT   Resp failure -continue vent -ICU team more confident that patient may be more extubated will in the next day or so. IV antibiotics for pulmonary coverage  Disposition: Determined.     I reviewed nursing notes, Consultant critical care, cardiology, nephrology notes, last 24 h vitals and pain scores, last 48 h intake and output, last 24 h labs and trends, and last 24 h imaging results. I have reviewed this patient's available data, including medical history, events of note, test results, etc as part of my evaluation.  A significant portion of that time was spent in counseling.  Care during the described time interval was provided by me.  This care required high  level of medical decision making.  05/05/2022  I updated the patient's status to the  patient's sister at bedside as well as ICU nurse.   Recommendations were made.  Questions were answered.  They expressed understanding & appreciation.   Subjective: (Chief complaint)  Sister in room.  ICU nursing in room.  Able to weaned off Levophed and vasopressin.  Just on dobutamine.  Continues continuous renal replacement therapy.  On TPN.  Flexi-Seal in place.  No major stool output but trying to minimize turning given severe agitation and decompensation.  Objective:  Vital signs:  Vitals:   05/05/22 0930 05/05/22 0945 05/05/22 1000 05/05/22 1015  BP:       Pulse: (!) 108 98    Resp: (!) 33 (!) 21 (!) 24 (!) 26  Temp:      TempSrc:      SpO2: 97% 98%    Weight:      Height:        Last BM Date : 05/04/22  Intake/Output   Yesterday:  10/07 0701 - 10/08 0700 In: 3468.4 [I.V.:2566.8; IV Piggyback:901.5] Out: 3099 [Urine:15; Emesis/NG output:260; Stool:38] This shift:  Total I/O In: 360.2 [I.V.:285.4; IV Piggyback:74.8] Out: 356 [Urine:12; Emesis/NG output:50]  Bowel function:  Flatus: No  BM: Scant  Drain: Abdominal drains   Physical Exam:  General: Pt debated in no major distress.  Opens eyes.  Following some commands.   Eyes: PERRL, Sclera clear.  No icterus Neuro: w/o focal sensory/motor deficits. Lymph: No head/neck/groin lymphadenopathy Psych:  No delerium/psychosis/paranoia.   HENT: Normocephalic, Mucus membranes moist.  No thrush Neck: Supple, No tracheal deviation.  No obvious thyromegaly Chest: No pain to chest wall compression.  Good respiratory excursion.  No audible wheezing CV:  Pulses intact.  Regular rhythm.  No major extremity edema MS: Normal AROM mjr joints.  No obvious deformity  Abdomen: Soft.  Nondistended.  Nontender.  No evidence of peritonitis.  Visions clean dry and intact.  No incarcerated hernias. GU: Foley catheter in place.  Dark low-volume urine in Foley bag Ext:  No deformity.  No mjr edema.  No cyanosis Skin: No petechiae / purpurea.  No major sores.  Warm and dry    Results:   Cultures: Recent Results (from the past 720 hour(s))  MRSA Next Gen by PCR, Nasal     Status: None   Collection Time: 04/29/22 10:48 AM   Specimen: Nasal Mucosa; Nasal Swab  Result Value Ref Range Status   MRSA by PCR Next Gen NOT DETECTED NOT DETECTED Final    Comment: (NOTE) The GeneXpert MRSA Assay (FDA approved for NASAL specimens only), is one component of a comprehensive MRSA colonization surveillance program. It is not intended to diagnose MRSA infection nor to guide or monitor treatment for MRSA  infections. Test performance is not FDA approved in patients less than 63 years old. Performed at [facility], [place] [address], [place], [place] [address]   Urine Culture     Status: None   Collection Time: 04/29/22 11:35 AM   Specimen: In/Out Cath Urine  Result Value Ref Range Status   Specimen Description IN/OUT CATH URINE  Final   Special Requests NONE  Final   Culture   Final    NO GROWTH Performed at [facility], [place] [address], [place], [place] [address]    Report Status 05/01/2022 FINAL  Final  Culture, blood (x 2)     Status: None   Collection Time: 04/29/22 11:53 AM   Specimen: BLOOD LEFT ARM  Result Value Ref Range Status   Specimen Description BLOOD LEFT ARM  Final   Special Requests   Final    BOTTLES DRAWN AEROBIC AND ANAEROBIC Blood Culture adequate volume   Culture   Final    NO GROWTH 5 DAYS Performed at [facility], [place] [address] [place], [place] [address]  Report Status 05/04/2022 FINAL  Final  Culture, blood (Routine X 2) w Reflex to ID Panel     Status: None   Collection Time: 04/30/22  5:13 PM   Specimen: BLOOD  Result Value Ref Range Status   Specimen Description BLOOD SITE NOT SPECIFIED  Final   Special Requests IN PEDIATRIC BOTTLE Blood Culture adequate volume  Final   Culture   Final    NO GROWTH 5 DAYS Performed at [facility], [address] [place], [place] [address]    Report Status 05/05/2022 FINAL  Final  Culture, blood (Routine X 2) w Reflex to ID Panel     Status: None   Collection Time: 04/30/22  6:37 PM   Specimen: BLOOD  Result Value Ref Range Status   Specimen Description BLOOD SITE NOT SPECIFIED  Final   Special Requests   Final    BOTTLES DRAWN AEROBIC AND ANAEROBIC Blood Culture results may not be optimal due to an inadequate volume of blood received in culture bottles   Culture   Final    NO GROWTH 5 DAYS Performed at [facility], [place] [address] [place], [place] [address]    Report  Status 05/05/2022 FINAL  Final  MRSA Next Gen by PCR, Nasal     Status: None   Collection Time: 05/03/22  1:24 PM   Specimen: Nasal Mucosa; Nasal Swab  Result Value Ref Range Status   MRSA by PCR Next Gen NOT DETECTED NOT DETECTED Final    Comment: (NOTE) The GeneXpert MRSA Assay (FDA approved for NASAL specimens only), is one component of a comprehensive MRSA colonization surveillance program. It is not intended to diagnose MRSA infection nor to guide or monitor treatment for MRSA infections. Test performance is not FDA approved in patients less than 7 years old. Performed at [facility], [place] [address], [place], [place] [address]     Labs: Results for orders placed or performed during the hospital encounter of 04/23/22 (from the past 48 hour(s))  Glucose, capillary     Status: Abnormal   Collection Time: 05/03/22 11:28 AM  Result Value Ref Range   Glucose-Capillary 191 (H) 70 - 99 mg/dL    Comment: Glucose reference range applies only to samples taken after fasting for at least 8 hours.  Heparin induced platelet Ab (HIT antibody)     Status: None   Collection Time: 05/03/22 12:29 PM  Result Value Ref Range   Heparin Induced Plt Ab 0.147 0.000 - 0.400 OD    Comment: (NOTE) Performed At: [facility] [place], [place] [id number] Rush Farmer MD QP:[id number]   Lactic acid, plasma     Status: Abnormal   Collection Time: 05/03/22 12:29 PM  Result Value Ref Range   Lactic Acid, Venous 2.0 (HH) 0.5 - 1.9 mmol/L    Comment: CRITICAL VALUE NOTED. VALUE IS CONSISTENT WITH PREVIOUSLY REPORTED/CALLED VALUE Performed at [facility], [place] [address], [place], [place] [address]   Cooxemetry Panel (carboxy, met, total hgb, O2 sat)     Status: Abnormal   Collection Time: 05/03/22 12:37 PM  Result Value Ref Range   Total hemoglobin 10.3 (L) 12.0 - 16.0 g/dL   O2 Saturation 64.4 %   Carboxyhemoglobin 0.9 0.5 - 1.5 %   Methemoglobin <0.7 0.0 - 1.5 %     Comment: Performed at [facility] [address] [place],  [address]  MRSA Next Gen by PCR, Nasal  Status: None   Collection Time: 05/03/22  1:24 PM   Specimen: Nasal Mucosa; Nasal Swab  Result Value Ref Range   MRSA by PCR Next Gen NOT DETECTED NOT DETECTED    Comment: (NOTE) The GeneXpert MRSA Assay (FDA approved for NASAL specimens only), is one component of a comprehensive MRSA colonization surveillance program. It is not intended to diagnose MRSA infection nor to guide or monitor treatment for MRSA infections. Test performance is not FDA approved in patients less than 30 years old. Performed at [facility], [place] [address] [place], [place] [address]   Lactic acid, plasma     Status: Abnormal   Collection Time: 05/03/22  3:23 PM  Result Value Ref Range   Lactic Acid, Venous 2.0 (HH) 0.5 - 1.9 mmol/L    Comment: CRITICAL VALUE NOTED.  VALUE IS CONSISTENT WITH PREVIOUSLY REPORTED AND CALLED VALUE. Performed at [facility], [place] [address], [place], [place] [address]   Glucose, capillary     Status: Abnormal   Collection Time: 05/03/22  3:43 PM  Result Value Ref Range   Glucose-Capillary 225 (H) 70 - 99 mg/dL    Comment: Glucose reference range applies only to samples taken after fasting for at least 8 hours.  Renal function panel (daily at 1600)     Status: Abnormal   Collection Time: 05/03/22  5:07 PM  Result Value Ref Range   Sodium 136 135 - 145 mmol/L   Potassium 3.7 3.5 - 5.1 mmol/L   Chloride 103 98 - 111 mmol/L   CO2 23 22 - 32 mmol/L   Glucose, Bld 228 (H) 70 - 99 mg/dL    Comment: Glucose reference range applies only to samples taken after fasting for at least 8 hours.   BUN 29 (H) 6 - 20 mg/dL   Creatinine, Ser 2.25 (H) 0.44 - 1.00 mg/dL   Calcium 6.7 (L) 8.9 - 10.3 mg/dL   Phosphorus 3.3 2.5 - 4.6 mg/dL   Albumin 1.5 (L) 3.5 - 5.0 g/dL   GFR, Estimated 26 (L) >60 mL/min    Comment: (NOTE) Calculated using the CKD-EPI  Creatinine Equation (2021)    Anion gap 10 5 - 15    Comment: Performed at [facility] [address] [place], [place] [address]  Lactic acid, plasma     Status: None   Collection Time: 05/03/22  5:20 PM  Result Value Ref Range   Lactic Acid, Venous 1.9 0.5 - 1.9 mmol/L    Comment: Performed at [facility] [address], [place], [place] [address]  Cooxemetry Panel (carboxy, met, total hgb, O2 sat)     Status: Abnormal   Collection Time: 05/03/22  5:20 PM  Result Value Ref Range   Total hemoglobin 10.5 (L) 12.0 - 16.0 g/dL   O2 Saturation 54.6 %   Carboxyhemoglobin 0.5 0.5 - 1.5 %   Methemoglobin <0.7 0.0 - 1.5 %    Comment: Performed at [facility] [address], [place], [place] [address]  POCT I-Stat EG7     Status: Abnormal   Collection Time: 05/03/22  6:46 PM  Result Value Ref Range   pH, Ven 7.339 7.25 - 7.43   pCO2, Ven 44.3 44 - 60 mmHg   pO2, Ven 25 (LL) 32 - 45 mmHg   Bicarbonate 24.0 20.0 - 28.0 mmol/L   TCO2 25 22 - 32 mmol/L   O2 Saturation 44 %   Acid-base deficit 2.0  0.0 - 2.0 mmol/L   Sodium 138 135 - 145 mmol/L   Potassium 3.5 3.5 - 5.1 mmol/L   Calcium, Ion 1.00 (L) 1.15 - 1.40 mmol/L   HCT 29.0 (L) 36.0 - 46.0 %   Hemoglobin 9.9 (L) 12.0 - 15.0 g/dL   Patient temperature 36.2 C    Sample type VENOUS   Cooxemetry Panel (carboxy, met, total hgb, O2 sat)     Status: Abnormal   Collection Time: 05/03/22  7:00 PM  Result Value Ref Range   Total hemoglobin 7.0 (L) 12.0 - 16.0 g/dL   O2 Saturation 53.4 %   Carboxyhemoglobin 1.3 0.5 - 1.5 %   Methemoglobin <0.7 0.0 - 1.5 %    Comment: Performed at [facility], [place] [address] [place], [place] [address]  Glucose, capillary     Status: Abnormal   Collection Time: 05/03/22  7:37 PM  Result Value Ref Range   Glucose-Capillary 204 (H) 70 - 99 mg/dL    Comment: Glucose reference range applies only to samples taken after fasting for at least 8 hours.  Glucose, capillary     Status:  Abnormal   Collection Time: 05/03/22 11:56 PM  Result Value Ref Range   Glucose-Capillary 201 (H) 70 - 99 mg/dL    Comment: Glucose reference range applies only to samples taken after fasting for at least 8 hours.  Glucose, capillary     Status: Abnormal   Collection Time: 05/04/22  4:08 AM  Result Value Ref Range   Glucose-Capillary 189 (H) 70 - 99 mg/dL    Comment: Glucose reference range applies only to samples taken after fasting for at least 8 hours.  Magnesium     Status: None   Collection Time: 05/04/22  4:17 AM  Result Value Ref Range   Magnesium 2.1 1.7 - 2.4 mg/dL    Comment: Performed at [facility] [address], [place], [place] [address]  CBC with Differential/Platelet     Status: Abnormal   Collection Time: 05/04/22  4:17 AM  Result Value Ref Range   WBC 26.9 (H) 4.0 - 10.5 K/uL   RBC 3.24 (L) 3.87 - 5.11 MIL/uL   Hemoglobin 8.9 (L) 12.0 - 15.0 g/dL   HCT 26.7 (L) 36.0 - 46.0 %   MCV 82.4 80.0 - 100.0 fL   MCH 27.5 26.0 - 34.0 pg   MCHC 33.3 30.0 - 36.0 g/dL   RDW 15.1 11.5 - 15.5 %   Platelets 27 (LL) 150 - 400 K/uL    Comment: Immature Platelet Fraction may be clinically indicated, consider ordering this additional test [id number] REPEATED TO VERIFY THIS CRITICAL RESULT HAS VERIFIED AND BEEN CALLED TO A. CAIN, RN BY HAYLEE [name] ON 10 07 2023 AT 0510, AND HAS BEEN READ BACK.     nRBC 1.9 (H) 0.0 - 0.2 %   Neutrophils Relative % 84 %   Neutro Abs 22.6 (H) 1.7 - 7.7 K/uL   Lymphocytes Relative 3 %   Lymphs Abs 0.9 0.7 - 4.0 K/uL   Monocytes Relative 4 %   Monocytes Absolute 1.0 0.1 - 1.0 K/uL   Eosinophils Relative 0 %   Eosinophils Absolute 0.0 0.0 - 0.5 K/uL   Basophils Relative 0 %   Basophils Absolute 0.0 0.0 - 0.1 K/uL   WBC Morphology DOHLE BODIES     Comment: MODERATE LEFT SHIFT (>5% METAS AND MYELOS,OCC PRO NOTED)   RBC Morphology See Note    Immature Granulocytes 9 %  Abs Immature Granulocytes 2.44 (H) 0.00 - 0.07 K/uL   Target Cells  PRESENT     Comment: Performed at [facility], [place] [address] [place], [place] [address]  Renal function panel     Status: Abnormal   Collection Time: 05/04/22  4:17 AM  Result Value Ref Range   Sodium 138 135 - 145 mmol/L   Potassium 3.3 (L) 3.5 - 5.1 mmol/L   Chloride 106 98 - 111 mmol/L   CO2 24 22 - 32 mmol/L   Glucose, Bld 207 (H) 70 - 99 mg/dL    Comment: Glucose reference range applies only to samples taken after fasting for at least 8 hours.   BUN 32 (H) 6 - 20 mg/dL   Creatinine, Ser 1.90 (H) 0.44 - 1.00 mg/dL   Calcium 6.5 (L) 8.9 - 10.3 mg/dL   Phosphorus 2.1 (L) 2.5 - 4.6 mg/dL   Albumin <1.5 (L) 3.5 - 5.0 g/dL   GFR, Estimated 31 (L) >60 mL/min    Comment: (NOTE) Calculated using the CKD-EPI Creatinine Equation (2021)    Anion gap 8 5 - 15    Comment: Performed at [facility] [address], [place], [place] [address]  Cooxemetry Panel (carboxy, met, total hgb, O2 sat)     Status: Abnormal   Collection Time: 05/04/22  4:18 AM  Result Value Ref Range   Total hemoglobin 9.1 (L) 12.0 - 16.0 g/dL   O2 Saturation 63.1 %   Carboxyhemoglobin 1.3 0.5 - 1.5 %   Methemoglobin <0.7 0.0 - 1.5 %    Comment: Performed at [facility], [place] [address] [place], [place] [address]  Glucose, capillary     Status: Abnormal   Collection Time: 05/04/22  7:59 AM  Result Value Ref Range   Glucose-Capillary 203 (H) 70 - 99 mg/dL    Comment: Glucose reference range applies only to samples taken after fasting for at least 8 hours.  Glucose, capillary     Status: Abnormal   Collection Time: 05/04/22 12:23 PM  Result Value Ref Range   Glucose-Capillary 235 (H) 70 - 99 mg/dL    Comment: Glucose reference range applies only to samples taken after fasting for at least 8 hours.  Glucose, capillary     Status: Abnormal   Collection Time: 05/04/22  4:43 PM  Result Value Ref Range   Glucose-Capillary 229 (H) 70 - 99 mg/dL    Comment: Glucose reference range applies only to  samples taken after fasting for at least 8 hours.  Cooxemetry Panel (carboxy, met, total hgb, O2 sat)     Status: Abnormal   Collection Time: 05/04/22  4:55 PM  Result Value Ref Range   Total hemoglobin 9.7 (L) 12.0 - 16.0 g/dL   O2 Saturation 54.4 %   Carboxyhemoglobin 1.0 0.5 - 1.5 %   Methemoglobin <0.7 0.0 - 1.5 %    Comment: Performed at [facility] [address] [place], [place] [address]  POCT I-Stat EG7     Status: Abnormal   Collection Time: 05/04/22  5:00 PM  Result Value Ref Range   pH, Ven 7.376 7.25 - 7.43   pCO2, Ven 40.5 (L) 44 - 60 mmHg   pO2, Ven 24 (LL) 32 - 45 mmHg   Bicarbonate 23.9 20.0 - 28.0 mmol/L   TCO2 25 22 - 32 mmol/L   O2 Saturation 45 %   Acid-base deficit 2.0 0.0 - 2.0 mmol/L   Sodium 136  135 - 145 mmol/L   Potassium 3.7 3.5 - 5.1 mmol/L   Calcium, Ion 0.99 (L) 1.15 - 1.40 mmol/L   HCT 51.0 (H) 36.0 - 46.0 %   Hemoglobin 17.3 (H) 12.0 - 15.0 g/dL   Patient temperature 36.3 C    Sample type VENOUS    Comment VALUES EXPECTED, NO REPEAT   Renal function panel (daily at 1600)     Status: Abnormal   Collection Time: 05/04/22  7:05 PM  Result Value Ref Range   Sodium 135 135 - 145 mmol/L   Potassium 3.7 3.5 - 5.1 mmol/L   Chloride 103 98 - 111 mmol/L   CO2 22 22 - 32 mmol/L   Glucose, Bld 216 (H) 70 - 99 mg/dL    Comment: Glucose reference range applies only to samples taken after fasting for at least 8 hours.   BUN 35 (H) 6 - 20 mg/dL   Creatinine, Ser 1.89 (H) 0.44 - 1.00 mg/dL   Calcium 6.8 (L) 8.9 - 10.3 mg/dL   Phosphorus 2.7 2.5 - 4.6 mg/dL   Albumin 1.5 (L) 3.5 - 5.0 g/dL   GFR, Estimated 32 (L) >60 mL/min    Comment: (NOTE) Calculated using the CKD-EPI Creatinine Equation (2021)    Anion gap 10 5 - 15    Comment: Performed at [facility] [address] [place], [place] [address]  Glucose, capillary     Status: Abnormal   Collection Time: 05/04/22  7:47 PM  Result Value Ref Range   Glucose-Capillary 131 (H) 70 - 99  mg/dL    Comment: Glucose reference range applies only to samples taken after fasting for at least 8 hours.  Glucose, capillary     Status: Abnormal   Collection Time: 05/04/22 11:36 PM  Result Value Ref Range   Glucose-Capillary 125 (H) 70 - 99 mg/dL    Comment: Glucose reference range applies only to samples taken after fasting for at least 8 hours.  Glucose, capillary     Status: Abnormal   Collection Time: 05/05/22  3:30 AM  Result Value Ref Range   Glucose-Capillary 127 (H) 70 - 99 mg/dL    Comment: Glucose reference range applies only to samples taken after fasting for at least 8 hours.  Cooxemetry Panel (carboxy, met, total hgb, O2 sat)     Status: Abnormal   Collection Time: 05/05/22  4:26 AM  Result Value Ref Range   Total hemoglobin 9.2 (L) 12.0 - 16.0 g/dL   O2 Saturation 72.2 %   Carboxyhemoglobin 1.5 0.5 - 1.5 %   Methemoglobin <0.7 0.0 - 1.5 %    Comment: Performed at [facility], [place] [address], [place], [place] [address]  CBC with Differential/Platelet     Status: Abnormal   Collection Time: 05/05/22  4:26 AM  Result Value Ref Range   WBC 34.9 (H) 4.0 - 10.5 K/uL   RBC 3.33 (L) 3.87 - 5.11 MIL/uL   Hemoglobin 9.4 (L) 12.0 - 15.0 g/dL   HCT 27.8 (L) 36.0 - 46.0 %   MCV 83.5 80.0 - 100.0 fL   MCH 28.2 26.0 - 34.0 pg   MCHC 33.8 30.0 - 36.0 g/dL   RDW 15.7 (H) 11.5 - 15.5 %   Platelets 39 (L) 150 - 400 K/uL    Comment: Immature Platelet Fraction may be clinically indicated, consider ordering this additional test [id number] REPEATED TO VERIFY    nRBC 2.2 (H) 0.0 - 0.2 %   Neutrophils  Relative % 90 %   Neutro Abs 31.4 (H) 1.7 - 7.7 K/uL   Lymphocytes Relative 2 %   Lymphs Abs 0.7 0.7 - 4.0 K/uL   Monocytes Relative 8 %   Monocytes Absolute 2.8 (H) 0.1 - 1.0 K/uL   Eosinophils Relative 0 %   Eosinophils Absolute 0.0 0.0 - 0.5 K/uL   Basophils Relative 0 %   Basophils Absolute 0.0 0.0 - 0.1 K/uL   WBC Morphology See Note     Comment: Mild Left Shift. 1  to 5% Metas and Myelos, Occ Pro Noted.   nRBC 5 (H) 0 /100 WBC   Abs Immature Granulocytes 0.00 0.00 - 0.07 K/uL   Tear Drop Cells PRESENT    Target Cells PRESENT     Comment: Performed at [facility], [place] [address], [place], [place] [address]  Magnesium     Status: None   Collection Time: 05/05/22  6:14 AM  Result Value Ref Range   Magnesium 2.2 1.7 - 2.4 mg/dL    Comment: Performed at [facility] [address] [place], [place] [address]  Comprehensive metabolic panel     Status: Abnormal   Collection Time: 05/05/22  6:14 AM  Result Value Ref Range   Sodium 138 135 - 145 mmol/L   Potassium 3.6 3.5 - 5.1 mmol/L   Chloride 106 98 - 111 mmol/L   CO2 23 22 - 32 mmol/L   Glucose, Bld 189 (H) 70 - 99 mg/dL    Comment: Glucose reference range applies only to samples taken after fasting for at least 8 hours.   BUN 34 (H) 6 - 20 mg/dL   Creatinine, Ser 1.97 (H) 0.44 - 1.00 mg/dL   Calcium 7.0 (L) 8.9 - 10.3 mg/dL   Total Protein 4.6 (L) 6.5 - 8.1 g/dL   Albumin <1.5 (L) 3.5 - 5.0 g/dL   AST 331 (H) 15 - 41 U/L   ALT 248 (H) 0 - 44 U/L   Alkaline Phosphatase 167 (H) 38 - 126 U/L   Total Bilirubin 1.3 (H) 0.3 - 1.2 mg/dL   GFR, Estimated 30 (L) >60 mL/min    Comment: (NOTE) Calculated using the CKD-EPI Creatinine Equation (2021)    Anion gap 9 5 - 15    Comment: Performed at [facility] [address], [place], [place] [address]  Glucose, capillary     Status: Abnormal   Collection Time: 05/05/22  8:19 AM  Result Value Ref Range   Glucose-Capillary 172 (H) 70 - 99 mg/dL    Comment: Glucose reference range applies only to samples taken after fasting for at least 8 hours.    Imaging / Studies: DG CHEST PORT 1 VIEW  Result Date: 05/05/2022 CLINICAL DATA:  [id number] with ventilator dependent respiratory failure. EXAM: PORTABLE CHEST 1 VIEW COMPARISON:  Portable chest yesterday at 3:57 p.m. FINDINGS: 6:06 a.m. ETT tip is 3.7 cm from the carina. NGT is coiled in  the stomach with the tip not seen. Right PICC terminates at about the superior cavoatrial junction, with right IJ double-lumen catheter terminating in the distal SVC as before. There are low lung volumes again noted with atelectatic bands in the left base and right infrahilar consolidation or atelectasis. Remainder of the lungs are clear with overall aeration unchanged. The sulci are sharp. There is cardiomegaly without CHF findings with stable mediastinal configuration. IMPRESSION: Low lung volumes with again noted right infrahilar consolidation or atelectasis and left basilar atelectatic bands.  Stable overall aeration. Cardiomegaly. Electronically Signed   By: Telford Nab M.D.   On: 05/05/2022 07:29   DG CHEST PORT 1 VIEW  Result Date: 05/04/2022 CLINICAL DATA:  Cough.  OG tube present. EXAM: PORTABLE CHEST 1 VIEW COMPARISON:  05/04/2022 and prior radiographs FINDINGS: Endotracheal tube with tip 3.5 cm above the carina, RIGHT IJ central venous catheter with tip overlying the mid SVC, enteric tube with tip overlying the mid stomach, and catheter with tip overlying the LEFT main pulmonary artery again noted. This is a mildly low volume study with mild bibasilar atelectasis identified. There is no evidence of pneumothorax or large pleural effusion. IMPRESSION: 1. Unchanged appearance of the chest with support apparatus as described. 2. Mild bibasilar atelectasis. Electronically Signed   By: Margarette Canada M.D.   On: 05/04/2022 16:18   [place] EKG SITE RITE  Result Date: 05/04/2022 If Site Rite image not attached, placement could not be confirmed due to current cardiac rhythm.  DG CHEST PORT 1 VIEW  Result Date: 05/04/2022 CLINICAL DATA:  Acute congestive heart failure EXAM: PORTABLE CHEST 1 VIEW COMPARISON:  Chest x-ray May 03, 2022 FINDINGS: Slight retraction of the inferiorly placed pulmonary arterial catheter, with the tip projecting over the left main pulmonary artery. Otherwise, stable support  apparatus. Unchanged mediastinal contours and cardiomegaly. Bibasilar linear opacities, likely atelectasis. Small left pleural effusion. No large pneumothorax. No acute osseous abnormality. The visualized upper abdomen is unremarkable. IMPRESSION: 1. Slight retraction of the pulmonary artery catheter, with the tip projecting over the left main pulmonary artery. 2. Bibasilar atelectasis and small right pleural effusion. Electronically Signed   By: Beryle Flock M.D.   On: 05/04/2022 10:34    Medications / Allergies: per chart  Antibiotics: Anti-infectives (From admission, onward)    Start     Dose/Rate Route Frequency Ordered Stop   05/04/22 1000  vancomycin (VANCOCIN) IVPB 1000 mg/200 mL premix        1,000 mg 200 mL/hr over 60 Minutes Intravenous Every 24 hours 05/03/22 0802     05/03/22 0845  vancomycin (VANCOREADY) IVPB 2000 mg/400 mL        2,000 mg 200 mL/hr over 120 Minutes Intravenous  Once 05/03/22 0751 05/03/22 1114   05/01/22 1800  piperacillin-tazobactam (ZOSYN) IVPB 3.375 g  Status:  Discontinued        3.375 g 12.5 mL/hr over 240 Minutes Intravenous Every 6 hours 05/01/22 1426 05/01/22 1427   05/01/22 1515  piperacillin-tazobactam (ZOSYN) IVPB 3.375 g        3.375 g 100 mL/hr over 30 Minutes Intravenous Every 6 hours 05/01/22 1428     04/30/22 1400  piperacillin-tazobactam (ZOSYN) IVPB 2.25 g  Status:  Discontinued        2.25 g 100 mL/hr over 30 Minutes Intravenous Every 8 hours 04/30/22 1325 05/01/22 1426   04/29/22 1530  piperacillin-tazobactam (ZOSYN) IVPB 3.375 g  Status:  Discontinued        3.375 g 12.5 mL/hr over 240 Minutes Intravenous Every 8 hours 04/29/22 1433 04/30/22 1325   04/29/22 1145  metroNIDAZOLE (FLAGYL) IVPB 500 mg  Status:  Discontinued        500 mg 100 mL/hr over 60 Minutes Intravenous Every 12 hours 04/29/22 1047 04/29/22 1433   04/29/22 1145  vancomycin (VANCOCIN) IVPB 1000 mg/200 mL premix  Status:  Discontinued        1,000 mg 200 mL/hr over  60 Minutes Intravenous  Once 04/29/22 1047 04/29/22 1055   04/29/22 1145  vancomycin (VANCOREADY) IVPB 1500 mg/300 mL  Status:  Discontinued        1,500 mg 150 mL/hr over 120 Minutes Intravenous Every 24 hours 04/29/22 1055 04/29/22 1357   04/29/22 1045  ceFEPIme (MAXIPIME) 2 g in sodium chloride 0.9 % 100 mL IVPB  Status:  Discontinued        2 g 200 mL/hr over 30 Minutes Intravenous Every 8 hours 04/29/22 0954 04/29/22 1433   04/23/22 0730  ceFAZolin (ANCEF) IVPB 2g/100 mL premix        2 g 200 mL/hr over 30 Minutes Intravenous On call to O.R. 04/23/22 5749 04/23/22 3552         Note: Portions of this report may have been transcribed using voice recognition software. Every effort was made to ensure accuracy; however, inadvertent computerized transcription errors may be present.   Any transcriptional errors that result from this process are unintentional.    Adin Hector, MD, FACS, MASCRS Esophageal, Gastrointestinal & Colorectal Surgery Robotic and Minimally Invasive Surgery  [facility]. [address] [place], [place] [address] [phone] Fax [phone] Main  CONTACT INFORMATION:  Weekday (9AM-5PM): Call [facility] main office at [phone]  Weeknight (5PM-9AM) or Weekend/Holiday: Check [email or web address] (password " [email or web address]") for General Surgery [facility] coverage  (Please, do not use SecureChat as it is not reliable communication to reach operating surgeons for immediate patient care given surgeries/outpatient duties/clinic/cross-coverage/off post-call which would lead to a delay in care.  Epic staff messaging available for outptient concerns, but may not be answered for 48 hours or more).     05/05/2022  10:42 AM

## 2022-05-05 NOTE — Procedures (Signed)
Intubation Procedure Note  ARVIE VILLARRUEL  [id number]  27-Nov-1970  Date:05/05/22  Time:7:07 PM   Provider Performing:Birdia Jaycox    Procedure: Intubation (79480)  Indication(s) Respiratory Failure  Consent Unable to obtain consent due to emergent nature of procedure.   Anesthesia Etomidate, Versed, Fentanyl, and Rocuronium   Time Out Verified patient identification, verified procedure, site/side was marked, verified correct patient position, special equipment/implants available, medications/allergies/relevant history reviewed, required imaging and test results available.   Sterile Technique Usual hand hygeine, masks, and gloves were used   Procedure Description Patient positioned in bed supine.  Sedation given as noted above.  Patient was intubated with endotracheal tube using Glidescope.  View was Grade 1 full glottis .  Number of attempts was 1.  Colorimetric CO2 detector was consistent with tracheal placement.   Complications/Tolerance None; patient tolerated the procedure well. Chest X-ray is ordered to verify placement.  Kipp Brood, MD [facility] ICU Physician [place]  Pager: [phone] Or Epic Secure Chat After hours: [phone].  05/05/2022, 7:08 PM

## 2022-05-06 ENCOUNTER — Inpatient Hospital Stay (HOSPITAL_COMMUNITY): Payer: Medicaid Other

## 2022-05-06 DIAGNOSIS — J690 Pneumonitis due to inhalation of food and vomit: Secondary | ICD-10-CM | POA: Diagnosis not present

## 2022-05-06 DIAGNOSIS — A419 Sepsis, unspecified organism: Secondary | ICD-10-CM | POA: Diagnosis not present

## 2022-05-06 DIAGNOSIS — R6521 Severe sepsis with septic shock: Secondary | ICD-10-CM | POA: Diagnosis not present

## 2022-05-06 DIAGNOSIS — I5021 Acute systolic (congestive) heart failure: Secondary | ICD-10-CM

## 2022-05-06 DIAGNOSIS — D696 Thrombocytopenia, unspecified: Secondary | ICD-10-CM

## 2022-05-06 DIAGNOSIS — Z9911 Dependence on respirator [ventilator] status: Secondary | ICD-10-CM | POA: Diagnosis not present

## 2022-05-06 LAB — CBC WITH DIFFERENTIAL/PLATELET
Abs Immature Granulocytes: 5.46 10*3/uL — ABNORMAL HIGH (ref 0.00–0.07)
Basophils Absolute: 0.1 10*3/uL (ref 0.0–0.1)
Basophils Relative: 0 %
Eosinophils Absolute: 0 10*3/uL (ref 0.0–0.5)
Eosinophils Relative: 0 %
HCT: 28.2 % — ABNORMAL LOW (ref 36.0–46.0)
Hemoglobin: 9.8 g/dL — ABNORMAL LOW (ref 12.0–15.0)
Immature Granulocytes: 13 %
Lymphocytes Relative: 4 %
Lymphs Abs: 1.6 10*3/uL (ref 0.7–4.0)
MCH: 27.6 pg (ref 26.0–34.0)
MCHC: 34.8 g/dL (ref 30.0–36.0)
MCV: 79.4 fL — ABNORMAL LOW (ref 80.0–100.0)
Monocytes Absolute: 1.6 10*3/uL — ABNORMAL HIGH (ref 0.1–1.0)
Monocytes Relative: 4 %
Neutro Abs: 35.1 10*3/uL — ABNORMAL HIGH (ref 1.7–7.7)
Neutrophils Relative %: 79 %
Platelets: 53 10*3/uL — ABNORMAL LOW (ref 150–400)
RBC: 3.55 MIL/uL — ABNORMAL LOW (ref 3.87–5.11)
RDW: 14.7 % (ref 11.5–15.5)
WBC: 43.7 10*3/uL — ABNORMAL HIGH (ref 4.0–10.5)
nRBC: 2.2 % — ABNORMAL HIGH (ref 0.0–0.2)

## 2022-05-06 LAB — RENAL FUNCTION PANEL
Albumin: <1.5 g/dL — ABNORMAL LOW (ref 3.5–5.0)
Albumin: 1.5 g/dL — ABNORMAL LOW (ref 3.5–5.0)
Anion gap: 9 (ref 5–15)
Anion gap: 9 (ref 5–15)
BUN: 35 mg/dL — ABNORMAL HIGH (ref 6–20)
BUN: 36 mg/dL — ABNORMAL HIGH (ref 6–20)
CO2: 23 mmol/L (ref 22–32)
CO2: 26 mmol/L (ref 22–32)
Calcium: 7 mg/dL — ABNORMAL LOW (ref 8.9–10.3)
Calcium: 7 mg/dL — ABNORMAL LOW (ref 8.9–10.3)
Chloride: 104 mmol/L (ref 98–111)
Chloride: 102 mmol/L (ref 98–111)
Creatinine, Ser: 1.91 mg/dL — ABNORMAL HIGH (ref 0.44–1.00)
Creatinine, Ser: 1.99 mg/dL — ABNORMAL HIGH (ref 0.44–1.00)
GFR, Estimated: 31 mL/min — ABNORMAL LOW (ref >60)
GFR, Estimated: 30 mL/min — ABNORMAL LOW (ref >60)
Glucose, Bld: 183 mg/dL — ABNORMAL HIGH (ref 70–99)
Glucose, Bld: 222 mg/dL — ABNORMAL HIGH (ref 70–99)
Phosphorus: 3.3 mg/dL (ref 2.5–4.6)
Phosphorus: 3.4 mg/dL (ref 2.5–4.6)
Potassium: 4.1 mmol/L (ref 3.5–5.1)
Potassium: 4 mmol/L (ref 3.5–5.1)
Sodium: 136 mmol/L (ref 135–145)
Sodium: 137 mmol/L (ref 135–145)

## 2022-05-06 LAB — GLUCOSE, CAPILLARY
Glucose-Capillary: 126 mg/dL — ABNORMAL HIGH (ref 70–99)
Glucose-Capillary: 134 mg/dL — ABNORMAL HIGH (ref 70–99)
Glucose-Capillary: 139 mg/dL — ABNORMAL HIGH (ref 70–99)
Glucose-Capillary: 150 mg/dL — ABNORMAL HIGH (ref 70–99)
Glucose-Capillary: 154 mg/dL — ABNORMAL HIGH (ref 70–99)
Glucose-Capillary: 165 mg/dL — ABNORMAL HIGH (ref 70–99)
Glucose-Capillary: 196 mg/dL — ABNORMAL HIGH (ref 70–99)

## 2022-05-06 LAB — COOXEMETRY PANEL
Carboxyhemoglobin: <0.3 % — ABNORMAL LOW (ref 0.5–1.5)
Methemoglobin: <0.7 % (ref 0.0–1.5)
O2 Saturation: 65.3 %
Total hemoglobin: 8.7 g/dL — ABNORMAL LOW (ref 12.0–16.0)

## 2022-05-06 LAB — MAGNESIUM: Magnesium: 2.3 mg/dL (ref 1.7–2.4)

## 2022-05-06 LAB — TRIGLYCERIDES: Triglycerides: 374 mg/dL — ABNORMAL HIGH (ref <150)

## 2022-05-06 MED ORDER — HEPARIN SODIUM (PORCINE) 5000 UNIT/ML IJ SOLN
INTRAMUSCULAR | Status: AC
  Administered 2022-05-06: 5000 [IU]
  Filled 2022-05-06: qty 1

## 2022-05-06 MED ORDER — SODIUM CHLORIDE 0.9 % IV SOLN
1.0000 g | Freq: Three times a day (TID) | INTRAVENOUS | Status: DC
  Administered 2022-05-06 – 2022-05-12 (×18): 1 g via INTRAVENOUS
  Filled 2022-05-06 (×18): qty 20

## 2022-05-06 MED ORDER — VITAL 1.5 CAL PO LIQD
1000.0000 mL | ORAL | Status: DC
  Administered 2022-05-06: 1000 mL

## 2022-05-06 MED ORDER — BARIUM SULFATE 2 % PO SUSP
450.0000 mL | Freq: Once | ORAL | Status: AC
  Administered 2022-05-06: 450 mL via ORAL

## 2022-05-06 MED ORDER — HEPARIN SODIUM (PORCINE) 1000 UNIT/ML DIALYSIS
1000.0000 [IU] | INTRAMUSCULAR | Status: DC | PRN
  Administered 2022-05-09: 2400 [IU] via INTRAVENOUS_CENTRAL
  Filled 2022-05-06 (×3): qty 6
  Filled 2022-05-06: qty 5

## 2022-05-06 MED ORDER — VITAL HIGH PROTEIN PO LIQD: 1000.0000 mL | ORAL | Status: DC

## 2022-05-06 MED ORDER — TRACE MINERALS CU-MN-SE-ZN 300-55-60-3000 MCG/ML IV SOLN
INTRAVENOUS | Status: AC
  Filled 2022-05-06: qty 828.8

## 2022-05-06 NOTE — Progress Notes (Signed)
Advanced Heart Failure Rounding Note  PCP-Cardiologist: None   Subjective:    Remains on vent + CRRT. Sedated  DBA 5.  Co-ox 65%  WBC--> up to 44. Antibiotics zosyn + vancomycin   PLTs 27k-> 39k> 44k   Objective:   Weight Range: 116.3 kg Body mass index is 44.01 kg/m.   Vital Signs:   Temp:  [96.6 F (35.9 C)-98.2 F (36.8 C)] 96.7 F (35.9 C) (10/09 0000) Pulse Rate:  [89-129] 97 (10/09 0234) Resp:  [14-51] 14 (10/09 0600) BP: (118)/(77) 118/77 (10/08 0840) SpO2:  [91 %-100 %] 99 % (10/09 0234) Arterial Line BP: (98-141)/(64-97) 112/71 (10/09 0600) FiO2 (%):  [40 %-60 %] 40 % (10/09 0234) Weight:  [116.3 kg] 116.3 kg (10/09 0500) Last BM Date : 05/05/22  Weight change: Filed Weights   05/04/22 0500 05/05/22 0500 05/06/22 0500  Weight: 114.9 kg 115 kg 116.3 kg    Intake/Output:   Intake/Output Summary (Last 24 hours) at 05/06/2022 0718 Last data filed at 05/06/2022 0700 Gross per 24 hour  Intake 2986.6 ml  Output 3823 ml  Net -836.4 ml     CVP 6-7  Physical Exam  General:  Intubated HEENT: ETT/NGT Neck: supple. no JVD. Carotids 2+ bilat; no bruits. No lymphadenopathy or thryomegaly appreciated. Cor: PMI nondisplaced. Regular rate & rhythm. No rubs, gallops or murmurs. Lungs: clear Abdomen: soft, nontender, nondistended. No hepatosplenomegaly. No bruits or masses. Good bowel sounds. Extremities: no cyanosis, clubbing, rash, edema. RUE PICC Neuro: Intubated.  Telemetry   SR 90-100s  Labs    CBC Recent Labs    05/05/22 0426 05/05/22 2108 05/06/22 0415  WBC 34.9*  --  43.7*  NEUTROABS 31.4*  --  35.1*  HGB 9.4* 10.2* 9.8*  HCT 27.8* 30.0* 28.2*  MCV 83.5  --  79.4*  PLT 39*  --  53*   Basic Metabolic Panel Recent Labs    05/05/22 0614 05/05/22 1510 05/05/22 2108 05/06/22 0415  NA 138 136 136 136  K 3.6 3.4* 3.5 4.1  CL 106 104  --  104  CO2 23 21*  --  23  GLUCOSE 189* 214*  --  183*  BUN 34* 35*  --  35*  CREATININE 1.97*  1.90*  --  1.91*  CALCIUM 7.0* 7.0*  --  7.0*  MG 2.2  --   --  2.3  PHOS  --  2.4*  --  3.3   Liver Function Tests Recent Labs    05/05/22 0614 05/05/22 1510 05/06/22 0415  AST 331*  --   --   ALT 248*  --   --   ALKPHOS 167*  --   --   BILITOT 1.3*  --   --   PROT 4.6*  --   --   ALBUMIN <1.5* <1.5* <1.5*   No results for input(s): "LIPASE", "AMYLASE" in the last 72 hours. Cardiac Enzymes No results for input(s): "CKTOTAL", "CKMB", "CKMBINDEX", "TROPONINI" in the last 72 hours.  Imaging   Pertinent imaging reviewed;  Echo 05/02/22: LVEF 20%, RV function moderately reduced.   Medications:     Scheduled Medications:  Chlorhexidine Gluconate Cloth  6 each Topical Daily   docusate  100 mg Per Tube BID   hydrocortisone sod succinate (SOLU-CORTEF) inj  100 mg Intravenous Q12H   insulin aspart  0-20 Units Subcutaneous Q4H   insulin aspart  7 Units Subcutaneous Q4H   mouth rinse  15 mL Mouth Rinse Q2H   pantoprazole (PROTONIX)  IV  40 mg Intravenous Q24H   phosphorus  500 mg Per Tube BID   polyethylene glycol  17 g Per Tube Daily   sodium chloride flush  10-40 mL Intracatheter Q12H   sodium chloride flush  3 mL Intravenous Q12H    Infusions:   prismasol BGK 4/2.5 400 mL/hr at 05/05/22 1619    prismasol BGK 4/2.5 400 mL/hr at 05/05/22 1615   sodium chloride     sodium chloride Stopped (05/04/22 2003)   sodium chloride     sodium chloride 10 mL/hr at 05/06/22 0700   sodium chloride     DOBUTamine 5 mcg/kg/min (05/06/22 0700)   fentaNYL infusion INTRAVENOUS 200 mcg/hr (05/06/22 0700)   midazolam 10 mg/hr (05/06/22 0700)   norepinephrine (LEVOPHED) Adult infusion 0 mcg/min (05/05/22 1853)   piperacillin-tazobactam Stopped (05/06/22 0355)   prismasol BGK 4/2.5 1,500 mL/hr at 05/06/22 0410   TPN ADULT (ION) 70 mL/hr at 05/06/22 0700   vancomycin Stopped (05/05/22 1052)   vasopressin 0 Units/min (05/05/22 1854)    PRN Medications: sodium chloride, sodium chloride,  acetaminophen, alum & mag hydroxide-simeth, fentaNYL, fentaNYL (SUBLIMAZE) injection, fentaNYL (SUBLIMAZE) injection, HYDROmorphone (DILAUDID) injection, midazolam, ondansetron (ZOFRAN) IV, mouth rinse, prochlorperazine, simethicone, sodium chloride, sodium chloride flush, sodium chloride flush    Patient Profile  51 y.o. AAF with history of bipolar disorder/schizophrenia, DM II, obesity, prior abdominal surgeries. Underwent robotic incisional hernia repair on 09/26.    Course c/b post-op ilieus/obstruction followed by septic shock 2/2 aspiration PNA, AKI on CRRT and cardiogenic shock.   Assessment/Plan  Cardiogenic & Septic shock -Septic shock 2/2 aspiration PNA. Procal > 150 -Taken emergently to cath lab 10/4 d/t concern for ST elevation MI. HS troponin > 24K. Coronaries clean. CI 1.87, RA mean 13, PCWP 19, LVEDP 16.  - WBC continuing to rise (now 43 K) although no increasing pressor requirement/fevers; possibly 2/2 steroids.   -Blood cultures 10/3 negative - On vancomycin and zosyn per CCM. - On DBA.   2. Acute systolic CHF -No prior cardiac history -Echo 10/03: EF 30-35%, RV moderately reduced -Bedside echo 10/04 at time of ECG changes: EF 15% - Remains on DBA 5  Co-ox 65%  - CVP 7.   3. Acute MI (resolved) -ST elevation in inferolateral leads on ECG  -HS troponin > 24K at peak -Cath with normal coronaries   4. Metabolic acidosis: -Improved; continue CRRT   5. AKI: -Likely ATN in setting of septic shock -Remains anuric. Continue CRRT. Nephrology following  6. Acute hypoxic respiratory failure: -2/2 aspiration PNA -Vent and abx management per CCM   7. Incisional hernia: -s/p robotic repair with mesh/lysis of adhesions this admit - GSU following   8. SBO -Post-op -Receiving TPN  9. Thrombocytopenia  - platelets 39>53 ; HIT panel negative. SRA pending    Length of Stay: [place], NP  05/06/2022, 7:18 AM  Advanced Heart Failure Team Pager [phone] (M-F;  7a - 5p)  Please contact [facility] for night-coverage after hours (5p -7a ) and weekends on [email or web address]

## 2022-05-06 NOTE — Progress Notes (Signed)
For wake up assessment patient was able to arouse, follow command to open eyes and grip bilaterally.  Does get very tachypnic and desynchronous with the vent when sedation is weaned

## 2022-05-06 NOTE — Progress Notes (Signed)
Patient ID: Patricia [name], female   DOB: Dec 15, 1970, 51 y.o.   MRN: [id number] S: Reintubated last night due to tachypnea and increased WOB.  Pt seen and examined while on CRRT.  Labs and orders reviewed and adjustments made as needed. O:BP 118/77   Pulse 100   Temp 97.8 F (36.6 C) (Oral)   Resp 15   Ht '5\' 4"'$  (1.626 m)   Wt 116.3 kg   LMP 07/07/2014 (Approximate)   SpO2 100%   BMI 44.01 kg/m   Intake/Output Summary (Last 24 hours) at 05/06/2022 0918 Last data filed at 05/06/2022 0900 Gross per 24 hour  Intake 3047.72 ml  Output 4005 ml  Net -957.28 ml   Intake/Output: I/O last 3 completed shifts: In: 4257.7 [I.V.:3657.6; IV Piggyback:600.1] Out: 9833 [Urine:22; Emesis/NG output:260; Stool:176]  Intake/Output this shift:  Total I/O In: 258.8 [I.V.:258.8] Out: 381 [Urine:10; Emesis/NG output:20] Weight change: 1.3 kg Gen:Intubated and sedated ASN:[id number] Resp: ventilated BS bilaterally Abd: +BS, soft, NT Ext: no edema  Recent Labs  Lab 04/30/22 0055 04/30/22 0144 04/30/22 1301 04/30/22 2108 05/01/22 0416 05/01/22 1600 05/01/22 2325 05/02/22 0409 05/02/22 1531 05/03/22 0411 05/03/22 1707 05/03/22 1846 05/04/22 0417 05/04/22 1700 05/04/22 1905 05/05/22 0614 05/05/22 1510 05/05/22 2108 05/06/22 0415  NA 142   < > 139   < > 133*  136   < > 133*   < > 136 135 136   < > 138 136 135 138 136 136 136  K 4.2   < > 4.7   < > 3.4*  3.5   < > 3.4*   < > 4.2 4.0 3.7   < > 3.3* 3.7 3.7 3.6 3.4* 3.5 4.1  CL 109  --  102  --  97*  97*   < > 101   < > 101 101 103  --  106  --  103 106 104  --  104  CO2 11*  --  18*  --  15*  16*   < > 15*   < > 19* 24 23  --  24  --  22 23 21*  --  23  GLUCOSE 132*  --  189*  --  190*  191*   < > 240*   < > 234* 197* 228*  --  207*  --  216* 189* 214*  --  183*  BUN 45*  --  54*  --  71*  69*   < > 62*   < > 43* 37* 29*  --  32*  --  35* 34* 35*  --  35*  CREATININE 4.68*  --  4.70*  --  6.01*  6.13*   < > 4.93*   < > 3.01* 2.55*  2.25*  --  1.90*  --  1.89* 1.97* 1.90*  --  1.91*  ALBUMIN 2.1*  --  2.3*  --  2.3*   < > 1.6*   < > 1.7* 1.6* 1.5*  --  <1.5*  --  1.5* <1.5* <1.5*  --  <1.5*  CALCIUM 7.4*  --  6.0*  --  6.6*  6.6*   < > 5.5*   < > 7.7* 7.3* 6.7*  --  6.5*  --  6.8* 7.0* 7.0*  --  7.0*  PHOS 3.2  --   --   --  6.3*   < >  --    < > 2.5 1.7* 3.3  --  2.1*  --  2.7  --  2.4*  --  3.3  AST 84*  --  513*  --  1,115*  --  700*  --   --   --   --   --   --   --   --  331*  --   --   --   ALT 48*  --  293*  --  632*  --  439*  --   --   --   --   --   --   --   --  248*  --   --   --    < > = values in this interval not displayed.   Liver Function Tests: Recent Labs  Lab 05/01/22 0416 05/01/22 1600 05/01/22 2325 05/02/22 0410 05/05/22 0614 05/05/22 1510 05/06/22 0415  AST 1,115*  --  700*  --  331*  --   --   ALT 632*  --  439*  --  248*  --   --   ALKPHOS 62  --  125  --  167*  --   --   BILITOT 1.8*  --  1.7*  --  1.3*  --   --   PROT 5.0*  --  4.5*  --  4.6*  --   --   ALBUMIN 2.3*   < > 1.6*   < > <1.5* <1.5* <1.5*   < > = values in this interval not displayed.   No results for input(s): "LIPASE", "AMYLASE" in the last 168 hours. No results for input(s): "AMMONIA" in the last 168 hours. CBC: Recent Labs  Lab 05/02/22 0410 05/03/22 0411 05/03/22 1846 05/04/22 0417 05/04/22 1700 05/05/22 0426 05/05/22 2108 05/06/22 0415  WBC 7.9 19.0*  --  26.9*  --  34.9*  --  43.7*  NEUTROABS  --   --   --  22.6*  --  31.4*  --  35.1*  HGB 11.1* 9.5*   < > 8.9*   < > 9.4* 10.2* 9.8*  HCT 33.8* 29.1*   < > 26.7*   < > 27.8* 30.0* 28.2*  MCV 84.1 82.7  --  82.4  --  83.5  --  79.4*  PLT 57* 38*  --  27*  --  39*  --  53*   < > = values in this interval not displayed.   Cardiac Enzymes: No results for input(s): "CKTOTAL", "CKMB", "CKMBINDEX", "TROPONINI" in the last 168 hours. CBG: Recent Labs  Lab 05/05/22 1639 05/05/22 2021 05/05/22 2328 05/06/22 0402 05/06/22 0828  GLUCAP 211* 163* 165* 150*  126*    Iron Studies: No results for input(s): "IRON", "TIBC", "TRANSFERRIN", "FERRITIN" in the last 72 hours. Studies/Results: DG CHEST PORT 1 VIEW  Result Date: 05/05/2022 CLINICAL DATA:  [id number] Encounter for intubation [id number] EXAM: PORTABLE CHEST 1 VIEW COMPARISON:  Chest x-ray 05/05/2022 6:06 a.m., CT chest 04/30/2022 FINDINGS: Endotracheal tube with tip terminating 3 cm above the carina. Enteric tube coursing below the hemidiaphragm with tip and side port overlying the expected region of the gastric lumen. Right internal jugular central venous catheter with tip overlying the expected region of superior cavoatrial junction. The heart and mediastinal contours are unchanged. Low lung volumes. Bilateral bases streaky airspace opacities likely representing atelectasis. No pulmonary edema. No definite pleural effusion. No pneumothorax. No acute osseous abnormality. IMPRESSION: Low lung volumes with bibasilar streaky airspace opacities likely representing atelectasis. Superimposed infection/inflammation not excluded. Lines and tubes in appropriate position. Electronically Signed   By: Thomasena Edis  Mckinley Jewel M.D.   On: 05/05/2022 19:22   DG CHEST PORT 1 VIEW  Result Date: 05/05/2022 CLINICAL DATA:  [id number] with ventilator dependent respiratory failure. EXAM: PORTABLE CHEST 1 VIEW COMPARISON:  Portable chest yesterday at 3:57 p.m. FINDINGS: 6:06 a.m. ETT tip is 3.7 cm from the carina. NGT is coiled in the stomach with the tip not seen. Right PICC terminates at about the superior cavoatrial junction, with right IJ double-lumen catheter terminating in the distal SVC as before. There are low lung volumes again noted with atelectatic bands in the left base and right infrahilar consolidation or atelectasis. Remainder of the lungs are clear with overall aeration unchanged. The sulci are sharp. There is cardiomegaly without CHF findings with stable mediastinal configuration. IMPRESSION: Low lung volumes with again noted  right infrahilar consolidation or atelectasis and left basilar atelectatic bands. Stable overall aeration. Cardiomegaly. Electronically Signed   By: Telford Nab M.D.   On: 05/05/2022 07:29   DG CHEST PORT 1 VIEW  Result Date: 05/04/2022 CLINICAL DATA:  Cough.  OG tube present. EXAM: PORTABLE CHEST 1 VIEW COMPARISON:  05/04/2022 and prior radiographs FINDINGS: Endotracheal tube with tip 3.5 cm above the carina, RIGHT IJ central venous catheter with tip overlying the mid SVC, enteric tube with tip overlying the mid stomach, and catheter with tip overlying the LEFT main pulmonary artery again noted. This is a mildly low volume study with mild bibasilar atelectasis identified. There is no evidence of pneumothorax or large pleural effusion. IMPRESSION: 1. Unchanged appearance of the chest with support apparatus as described. 2. Mild bibasilar atelectasis. Electronically Signed   By: Margarette Canada M.D.   On: 05/04/2022 16:18   [place] EKG SITE RITE  Result Date: 05/04/2022 If Site Rite image not attached, placement could not be confirmed due to current cardiac rhythm.  DG CHEST PORT 1 VIEW  Result Date: 05/04/2022 CLINICAL DATA:  Acute congestive heart failure EXAM: PORTABLE CHEST 1 VIEW COMPARISON:  Chest x-ray May 03, 2022 FINDINGS: Slight retraction of the inferiorly placed pulmonary arterial catheter, with the tip projecting over the left main pulmonary artery. Otherwise, stable support apparatus. Unchanged mediastinal contours and cardiomegaly. Bibasilar linear opacities, likely atelectasis. Small left pleural effusion. No large pneumothorax. No acute osseous abnormality. The visualized upper abdomen is unremarkable. IMPRESSION: 1. Slight retraction of the pulmonary artery catheter, with the tip projecting over the left main pulmonary artery. 2. Bibasilar atelectasis and small right pleural effusion. Electronically Signed   By: Beryle Flock M.D.   On: 05/04/2022 10:34    Chlorhexidine Gluconate Cloth   6 each Topical Daily   docusate  100 mg Per Tube BID   hydrocortisone sod succinate (SOLU-CORTEF) inj  100 mg Intravenous Q12H   insulin aspart  0-20 Units Subcutaneous Q4H   insulin aspart  7 Units Subcutaneous Q4H   mouth rinse  15 mL Mouth Rinse Q2H   pantoprazole (PROTONIX) IV  40 mg Intravenous Q24H   phosphorus  500 mg Per Tube BID   polyethylene glycol  17 g Per Tube Daily   sodium chloride flush  10-40 mL Intracatheter Q12H   sodium chloride flush  3 mL Intravenous Q12H    BMET    Component Value Date/Time   NA 136 05/06/2022 0415   NA 139 07/02/2014 1923   K 4.1 05/06/2022 0415   K 3.9 07/02/2014 1923   CL 104 05/06/2022 0415   CL 105 07/02/2014 1923   CO2 23 05/06/2022 0415   CO2 26 07/02/2014  1923   GLUCOSE 183 (H) 05/06/2022 0415   GLUCOSE 95 07/02/2014 1923   BUN 35 (H) 05/06/2022 0415   BUN 7 07/02/2014 1923   CREATININE 1.91 (H) 05/06/2022 0415   CREATININE 0.98 07/02/2014 1923   CALCIUM 7.0 (L) 05/06/2022 0415   CALCIUM 8.5 07/02/2014 1923   GFRNONAA 31 (L) 05/06/2022 0415   GFRNONAA >60 07/02/2014 1923   GFRAA >60 04/22/2018 2215   GFRAA >60 07/02/2014 1923   CBC    Component Value Date/Time   WBC 43.7 (H) 05/06/2022 0415   RBC 3.55 (L) 05/06/2022 0415   HGB 9.8 (L) 05/06/2022 0415   HGB 9.4 (L) 07/02/2014 1923   HCT 28.2 (L) 05/06/2022 0415   HCT 30.3 (L) 07/02/2014 1923   PLT 53 (L) 05/06/2022 0415   PLT 449 (H) 07/02/2014 1923   MCV 79.4 (L) 05/06/2022 0415   MCV 71 (L) 07/02/2014 1923   MCH 27.6 05/06/2022 0415   MCHC 34.8 05/06/2022 0415   RDW 14.7 05/06/2022 0415   RDW 18.8 (H) 07/02/2014 1923   LYMPHSABS 1.6 05/06/2022 0415   MONOABS 1.6 (H) 05/06/2022 0415   EOSABS 0.0 05/06/2022 0415   BASOSABS 0.1 05/06/2022 0415     Assessment/Plan:  AKI - likely ATN in the setting of cardiogenic and septic shock.  Started on CRRT 05/01/22 after RIJ temp HD catheter placed. All fluids 4K/2.5Ca, pre and post-filter rates at 400 mL/hr, dialysate  rate 1500 mL/hr, no anticoagulation due to thrombocytopenia.  UF goal for CVP of 7-9 cm H2O.   Remains anuric, continue with CRRT as ordered. Cardiogenic/septic shock - weaned off levophed, on dobutamine.  Heart failure team following. Likely due to aspiration pneumonia. Acute hypoxic respiratory failure - was re-intubated yesterday.  Vent per PCCM Acute MI - normal coronaries by cath [facility] - improved with CRRT. S/p robotic incisional hernia repair with mesh and LOA - Surgery following.   Donetta Potts, MD [employer] [phone]

## 2022-05-06 NOTE — Progress Notes (Signed)
eLink Physician-Brief Progress Note Patient Name: Patricia [name] DOB: 1970-12-15 MRN: [id number]   Date of Service  05/06/2022  HPI/Events of Note  Received request for bilateral wrist restraints.   Pt remains intubated and sedated.   eICU Interventions  Wrist restraints renewed.     Intervention Category Minor Interventions: Other:  Elsie Lincoln 05/06/2022, 12:16 AM

## 2022-05-06 NOTE — Progress Notes (Signed)
Subjective/Chief Complaint: Extubated yesterday afternoon. Had tachypnea and was reintubated yesterday evening. WBC 43. Having bowel function.   Objective: Vital signs in last 24 hours: Temp:  [96.6 F (35.9 C)-98.2 F (36.8 C)] 96.7 F (35.9 C) (10/09 0000) Pulse Rate:  [89-129] 97 (10/09 0234) Resp:  [14-51] 14 (10/09 0600) BP: (118)/(77) 118/77 (10/08 0840) SpO2:  [91 %-100 %] 99 % (10/09 0234) Arterial Line BP: (98-141)/(64-97) 112/71 (10/09 0600) FiO2 (%):  [40 %-60 %] 40 % (10/09 0234) Weight:  [116.3 kg] 116.3 kg (10/09 0500) Last BM Date : 05/05/22  Intake/Output from previous day: 10/08 0701 - 10/09 0700 In: 2986.6 [I.V.:2486.7; IV Piggyback:499.9] Out: 4268 [Urine:22; Emesis/NG output:250; Stool:138] Intake/Output this shift: No intake/output data recorded.  General: intubated, sedated CV: RRR HEENT: NG in place with gastric contents Abdomen: soft, nondistended, incisions clean and dry with no erythema or induration. Flexiseal with stool.  Lab Results:  Recent Labs    05/05/22 0426 05/05/22 2108 05/06/22 0415  WBC 34.9*  --  43.7*  HGB 9.4* 10.2* 9.8*  HCT 27.8* 30.0* 28.2*  PLT 39*  --  53*   BMET Recent Labs    05/05/22 1510 05/05/22 2108 05/06/22 0415  NA 136 136 136  K 3.4* 3.5 4.1  CL 104  --  104  CO2 21*  --  23  GLUCOSE 214*  --  183*  BUN 35*  --  35*  CREATININE 1.90*  --  1.91*  CALCIUM 7.0*  --  7.0*   PT/INR No results for input(s): "LABPROT", "INR" in the last 72 hours. ABG Recent Labs    05/04/22 1700 05/05/22 2108  PHART  --  7.491*  HCO3 23.9 24.2    Studies/Results: DG CHEST PORT 1 VIEW  Result Date: 05/05/2022 CLINICAL DATA:  [id number] Encounter for intubation [id number] EXAM: PORTABLE CHEST 1 VIEW COMPARISON:  Chest x-ray 05/05/2022 6:06 a.m., CT chest 04/30/2022 FINDINGS: Endotracheal tube with tip terminating 3 cm above the carina. Enteric tube coursing below the hemidiaphragm with tip and side port overlying the  expected region of the gastric lumen. Right internal jugular central venous catheter with tip overlying the expected region of superior cavoatrial junction. The heart and mediastinal contours are unchanged. Low lung volumes. Bilateral bases streaky airspace opacities likely representing atelectasis. No pulmonary edema. No definite pleural effusion. No pneumothorax. No acute osseous abnormality. IMPRESSION: Low lung volumes with bibasilar streaky airspace opacities likely representing atelectasis. Superimposed infection/inflammation not excluded. Lines and tubes in appropriate position. Electronically Signed   By: Iven Finn M.D.   On: 05/05/2022 19:22   DG CHEST PORT 1 VIEW  Result Date: 05/05/2022 CLINICAL DATA:  [id number] with ventilator dependent respiratory failure. EXAM: PORTABLE CHEST 1 VIEW COMPARISON:  Portable chest yesterday at 3:57 p.m. FINDINGS: 6:06 a.m. ETT tip is 3.7 cm from the carina. NGT is coiled in the stomach with the tip not seen. Right PICC terminates at about the superior cavoatrial junction, with right IJ double-lumen catheter terminating in the distal SVC as before. There are low lung volumes again noted with atelectatic bands in the left base and right infrahilar consolidation or atelectasis. Remainder of the lungs are clear with overall aeration unchanged. The sulci are sharp. There is cardiomegaly without CHF findings with stable mediastinal configuration. IMPRESSION: Low lung volumes with again noted right infrahilar consolidation or atelectasis and left basilar atelectatic bands. Stable overall aeration. Cardiomegaly. Electronically Signed   By: Telford Nab M.D.   On: 05/05/2022 07:29  DG CHEST PORT 1 VIEW  Result Date: 05/04/2022 CLINICAL DATA:  Cough.  OG tube present. EXAM: PORTABLE CHEST 1 VIEW COMPARISON:  05/04/2022 and prior radiographs FINDINGS: Endotracheal tube with tip 3.5 cm above the carina, RIGHT IJ central venous catheter with tip overlying the mid SVC,  enteric tube with tip overlying the mid stomach, and catheter with tip overlying the LEFT main pulmonary artery again noted. This is a mildly low volume study with mild bibasilar atelectasis identified. There is no evidence of pneumothorax or large pleural effusion. IMPRESSION: 1. Unchanged appearance of the chest with support apparatus as described. 2. Mild bibasilar atelectasis. Electronically Signed   By: Margarette Canada M.D.   On: 05/04/2022 16:18   [place] EKG SITE RITE  Result Date: 05/04/2022 If Site Rite image not attached, placement could not be confirmed due to current cardiac rhythm.  DG CHEST PORT 1 VIEW  Result Date: 05/04/2022 CLINICAL DATA:  Acute congestive heart failure EXAM: PORTABLE CHEST 1 VIEW COMPARISON:  Chest x-ray May 03, 2022 FINDINGS: Slight retraction of the inferiorly placed pulmonary arterial catheter, with the tip projecting over the left main pulmonary artery. Otherwise, stable support apparatus. Unchanged mediastinal contours and cardiomegaly. Bibasilar linear opacities, likely atelectasis. Small left pleural effusion. No large pneumothorax. No acute osseous abnormality. The visualized upper abdomen is unremarkable. IMPRESSION: 1. Slight retraction of the pulmonary artery catheter, with the tip projecting over the left main pulmonary artery. 2. Bibasilar atelectasis and small right pleural effusion. Electronically Signed   By: Beryle Flock M.D.   On: 05/04/2022 10:34    Anti-infectives: Anti-infectives (From admission, onward)    Start     Dose/Rate Route Frequency Ordered Stop   05/04/22 1000  vancomycin (VANCOCIN) IVPB 1000 mg/200 mL premix        1,000 mg 200 mL/hr over 60 Minutes Intravenous Every 24 hours 05/03/22 0802     05/03/22 0845  vancomycin (VANCOREADY) IVPB 2000 mg/400 mL        2,000 mg 200 mL/hr over 120 Minutes Intravenous  Once 05/03/22 0751 05/03/22 1114   05/01/22 1800  piperacillin-tazobactam (ZOSYN) IVPB 3.375 g  Status:  Discontinued         3.375 g 12.5 mL/hr over 240 Minutes Intravenous Every 6 hours 05/01/22 1426 05/01/22 1427   05/01/22 1515  piperacillin-tazobactam (ZOSYN) IVPB 3.375 g        3.375 g 100 mL/hr over 30 Minutes Intravenous Every 6 hours 05/01/22 1428     04/30/22 1400  piperacillin-tazobactam (ZOSYN) IVPB 2.25 g  Status:  Discontinued        2.25 g 100 mL/hr over 30 Minutes Intravenous Every 8 hours 04/30/22 1325 05/01/22 1426   04/29/22 1530  piperacillin-tazobactam (ZOSYN) IVPB 3.375 g  Status:  Discontinued        3.375 g 12.5 mL/hr over 240 Minutes Intravenous Every 8 hours 04/29/22 1433 04/30/22 1325   04/29/22 1145  metroNIDAZOLE (FLAGYL) IVPB 500 mg  Status:  Discontinued        500 mg 100 mL/hr over 60 Minutes Intravenous Every 12 hours 04/29/22 1047 04/29/22 1433   04/29/22 1145  vancomycin (VANCOCIN) IVPB 1000 mg/200 mL premix  Status:  Discontinued        1,000 mg 200 mL/hr over 60 Minutes Intravenous  Once 04/29/22 1047 04/29/22 1055   04/29/22 1145  vancomycin (VANCOREADY) IVPB 1500 mg/300 mL  Status:  Discontinued        1,500 mg 150 mL/hr over 120 Minutes Intravenous  Every 24 hours 04/29/22 1055 04/29/22 1357   04/29/22 1045  ceFEPIme (MAXIPIME) 2 g in sodium chloride 0.9 % 100 mL IVPB  Status:  Discontinued        2 g 200 mL/hr over 30 Minutes Intravenous Every 8 hours 04/29/22 0954 04/29/22 1433   04/23/22 0730  ceFAZolin (ANCEF) IVPB 2g/100 mL premix        2 g 200 mL/hr over 30 Minutes Intravenous On call to O.R. 04/23/22 0211 04/23/22 1735       Assessment/Plan: POD 13 s/p robotic incisional hernia repair -Postop ileus complicated by aspiration: Ileus resolving, having bowel function. Ok to trial trophic tube feeds today, do not advance. -appreciate ccm, cards and nephrology care  Cardiogenic/septic shock -On Zosyn for aspiration pneumonia -cath 10/4 -Pressors off, on dobutamine  AKI -CRRT  Resp failure -Vent management per [place], MD [facility]  Surgery General, Hepatobiliary and Pancreatic Surgery 05/06/22 7:31 AM

## 2022-05-06 NOTE — Progress Notes (Signed)
RT transported patient from 2H18 to CT and back with RN. No complications. RT will continue to monitor.

## 2022-05-06 NOTE — Progress Notes (Signed)
PT Cancellation Note  Patient Details Name: Patricia [name] MRN: [id number] DOB: [birth year]   Cancelled Treatment:    Reason Eval/Treat Not Completed: Patient not medically ready (pt intubated, sedated, on CRRT and not yet medically appropriate)   [name] [name] 05/06/2022, 7:44 AM [place] Office: [phone]

## 2022-05-06 NOTE — Progress Notes (Signed)
Tracheal aspirate collected and sent to lab. 

## 2022-05-06 NOTE — TOC Initial Note (Signed)
Transition of Care [facility]) - Initial/Assessment Note    Patient Details  Name: Patricia [name] MRN: [id number] Date of Birth: 26-Apr-1971  Transition of [facility]) CM/SW Contact:    Patricia Rasher, RN Phone Number: [phone] 05/06/2022, 11:21 AM  Clinical Narrative:                 HF TOC CM spoke to pt's sister, Patricia [name]. Lives in home with sister, Patricia [name]. Pt was independent prior to hospital stay. Does not have a scale at home. Has Medicaid transportation to appts. Receives SS disability check monthly. Will continue to follow for dc needs.   Expected Discharge Plan: [place] Barriers to Discharge: Continued Medical Work up   Patient Goals and CMS Choice   Expected Discharge Plan and Services Expected Discharge Plan: [place]   Discharge Planning Services: CM Consult   Living arrangements for the past 2 months: Single Family Home                                      Prior Living Arrangements/Services Living arrangements for the past 2 months: Single Family Home Lives with:: Siblings          Need for Family Participation in Patient Care: Yes (Comment) Care giver support system in place?: Yes (comment)   Criminal Activity/Legal Involvement Pertinent to Current Situation/Hospitalization: No - Comment as needed  Activities of Daily Living      Permission Sought/Granted Permission sought to share information with : Case Manager, Family Supports Permission granted to share information with : Yes, Verbal Permission Granted  Share Information with NAME: [facility]     Permission granted to share info w Relationship: sister  Permission granted to share info w Contact Information: [phone]  Emotional Assessment   Attitude/Demeanor/Rapport: Intubated (Following Commands or Not Following Commands)          Admission diagnosis:  Incisional hernia [K43.2] Hernia of abdominal cavity [K46.9] Patient  Active Problem List   Diagnosis Date Noted   Septic shock ([place]) 04/29/2022   Aspiration pneumonia ([place]) 04/29/2022   Prolonged QT interval 04/29/2022   Essential hypertension 04/29/2022   Hernia of abdominal cavity 04/24/2022   Incisional hernia 04/23/2022   Chronic low back pain 11/20/2021   Limited mobility 11/20/2021   Ambulates with cane 11/20/2021   Ventral hernia without obstruction or gangrene 11/15/2021   Right carpal tunnel syndrome 11/15/2021   Diabetes mellitus without complication ([place]) 95/03/3266   Insomnia 11/15/2021   Schizophrenia ([place]) 05/21/2017   Morbid obesity ([place]) 07/22/2014   Obstructive sleep apnea 04/05/2008   MIGRAINE HEADACHE 04/05/2008   Diabetes mellitus type 2 in obese ([place]) 03/21/2008   BIPOLAR DISORDER UNSPECIFIED 03/21/2008   DEPRESSION 03/21/2008   Asthma 03/21/2008   PCP:  Patricia Hollow, MD Pharmacy:   [facility] [address], [place] - [place] [id number] [place] [address] Phone: [phone] Fax: [phone]     Social Determinants of Health (SDOH) Interventions    Readmission Risk Interventions     No data to display

## 2022-05-06 NOTE — Progress Notes (Signed)
Bilateral lower extremity venous duplex completed. Refer to "CV Proc" under chart review to view preliminary results.  05/06/2022 11:03 AM Kelby Aline., MHA, RVT, RDCS, RDMS

## 2022-05-06 NOTE — Progress Notes (Signed)
PHARMACY - TOTAL PARENTERAL NUTRITION CONSULT NOTE  Indication: Small bowel obstruction and ileus  Patient Measurements: Height: '5\' 4"'  (162.6 cm) Weight: 116.3 kg (256 lb 6.3 oz) IBW/kg (Calculated) : 54.7 TPN AdjBW (KG): 68.4 Body mass index is 44.01 kg/m.  Assessment:  51 yo female s/p difficult incisional hernia repair with long LoA on 9/26.  Patient developed a SBO based on CT scan 10/2.  Poor PO intake since surgery due to abdominal pain.  Pharmacy consulted to manage TPN.  Patient was extubated and then re-intubated on 10/8 due to respiratory distress.  Glucose / Insulin: A1c 6.3% - CBGs < 180 SM started 10/2 > HC 159m BID Used 26 units rSSI (down), Novolog 7 units Q4H, 40 units in TPN Electrolytes: Na low normal, K 4.1 post 2 runs, others WNL (Phos 3.3 on KPhos Neutral 5015mPT BID) Renal: CRRT started 10/4, BUN 35 Hepatic: AST/ALT down 331/248, alk phos up to 167, tbili down to 1.3, TG elevated at 374 (off Propofol 10/4) Intake / Output; MIVF: anuric, NG 25055mstool 138m42mRRT 3.3L, net +17.5L GI Imaging: 10/3 CT: continued SBO  10/2 CT: SBO GI Surgeries / Procedures: none since TPN initiation   Central access: CVC triple lumen placed 05/04/22 TPN start date: 04/29/22  Nutritional Goals: RD Estimated Needs Total Energy Estimated Needs: 1800-2000 kcal/d Total Protein Estimated Needs: 110-130 grams Total Fluid Estimated Needs: 1.8-2L/d  Current Nutrition:  TPN   Plan:  Continue concentrated TPN at 70 ml/hr to provide 124g AA and 1848 kCal, meeting 100% of needs.  Lipids at 20% of kCal (added back 10/6). Electrolytes in TPN: increase Na to 125mE74m K 0mEq/60mCa increased to 6mEq/L62m 10/7, Mg 0mEq/L,62mos increased to 18mmol/L24m10/7, Cl:Ac 1:2 Add standard MVI and trace elements to TPN, chromium removed for CRRT  Continue to replace K outside of TPN (KPhos Neutral 500mg BID)73mtinue resistant SSI Q4H and regular insulin at 40 units in TPN Novolog 7 units Q4H  per CCM Renal function panel BID and daily Mag while on CRRT per Renal Consider repeating TG on Thurs 10/12  F/u with order for trickle feed - monitor CBGs, tolerance  Janell Keeling D. Quindell Shere, PharMina MarbleBCPS, BCCCP 10/9[place], 10:23 AM

## 2022-05-06 NOTE — Progress Notes (Signed)
Nutrition Follow-up  DOCUMENTATION CODES:   Not applicable  INTERVENTION:   - TPN per pharmacy  Initiate trickle tube feeds via OG tube: - Vital 1.5 @ 20 ml/hr (480 ml/day)  Trickle tube feeding regimen provides 720 kcal, 32 grams of protein, and 367 ml of H2O.   RD will monitor for ability to advance tube feeds to goal: - Vital 1.5 @ 50 ml/hr (1200 ml/day) - PROSource TF 60 ml BID  Tube feeding regimen at goal rate provides 1960 kcal, 121 grams of protein, and 917 ml of H2O.  NUTRITION DIAGNOSIS:   Inadequate oral intake related to inability to eat as evidenced by NPO status (SBO).  Ongoing, being addressed via TPN and trickle TF  GOAL:   Patient will meet greater than or equal to 90% of their needs  Met via TPN  MONITOR:   Vent status, Labs, Weight trends, Skin, I & O's, Other (TPN), TF tolerance  REASON FOR ASSESSMENT:   Consult Enteral/tube feeding initiation and management (trickle tube feeds)  ASSESSMENT:   Pt with hx of bipolar disorder, HTN, HLD, GERD, chronic bronchitis, GERD, DM type 2, and multiple prior abdominal surgeries presented to hospital for planned repair of a spigelian hernia.  09/26 - s/p robotic incisional hernia repair with mesh, extensive LOA 10/02 - transferred to ICU, pressors started, NG tube placed for decompression, TPN start 10/03 - intubated, NG tube exchanged for OG tube 10/04 - CRRT start, OG tube removed and replaced, concern for STEMI s/p L heart cath and coronary angiography without occlusion 10/08 - extubated, later reintubated  Consult received for initiation and management of trickle tube feeds. Pt with OG tube in place to low intermittent suction. Noted plan for CT abdomen/pelvis today.  Spoke with RN at bedside. Pt remains on CRRT but has been weaned off of pressors. Pt remains on dobutamine.  Concentrated TPN infusing at goal rate of 70 ml/hr which provides 1848 kcal and 124 grams of protein. Lipids are at 20% of  kcal. Plan for repeat TG on 10/12.  Admit weight: 109.3 kg Current weight: 116.3 kg  Pt with mild pitting generalized edema.  Patient remains intubated on ventilator support MV: 11.8 L/min Temp (24hrs), Avg:97.4 F (36.3 C), Min:96.6 F (35.9 C), Max:98.2 F (36.8 C)  Drips: Dobutamine Fentanyl Versed TPN: 70 ml/hr  Medications reviewed and include: colace, IV solu-cortef, SSI q 4 hours, novolog 7 units q 4 hours, IV protonix, K phos 500 mg BID, miralax, IV abx  Labs reviewed: BUN 35, creatinine 1.91, ionized calcium 1.03, elevated LFTs, TG 374, WBC 43.7, hemoglobin 9.8, platelets 53 CBG's: 126-211 x 24 hours  UOP: 22 ml x 24 hours OGT: 250 ml x 24 hours Stool: 138 ml x 24 hours CRRT UF: 3413 ml x 24 hours I/O's: +17.5 L since admit  Diet Order:   Diet Order             Diet NPO time specified  Diet effective now                   EDUCATION NEEDS:   No education needs have been identified at this time  Skin:  Skin Assessment: Reviewed RN Assessment (surgical incisions - abdomen)  Last BM:  05/06/22 rectal tube  Height:   Ht Readings from Last 1 Encounters:  04/30/22 '5\' 4"'  (1.626 m)    Weight:   Wt Readings from Last 1 Encounters:  05/06/22 116.3 kg    Ideal Body Weight:  54.5  kg  BMI:  Body mass index is 44.01 kg/m.  Estimated Nutritional Needs:   Kcal:  1800-2000 kcal/d  Protein:  110-130 grams  Fluid:  1.8-2L/d    Gustavus Bryant, MS, RD, LDN Inpatient Clinical Dietitian Please see AMiON for contact information.

## 2022-05-06 NOTE — Progress Notes (Signed)
NAME:  Patricia [name], MRN:  [id number], DOB:  1970-09-08, LOS: 12 ADMISSION DATE:  04/23/2022, CONSULTATION DATE:  04/29/2022 REFERRING MD:  Felicie Morn, MD, CHIEF COMPLAINT:   Small bowel Obstruction, aspiration event, Sepsis, Respiratory Failure    History of Present Illness:  Patricia [name] with above hx and nuc study in 2015 neg for ischemia done for chest pain and echo with EF 60-65% presented to hospital for excisional hernia repair (robotic hernia repair 04/23/22) with mesh, ileus post op on TNA. Developed vomiting and hypotension after NG came out on 04/29/22 and PNA on CXR, was tachycardic and BP improved with IV fluids. DX with sepsis and ABX started. At one point prolonged Qtc. Pt admitted to ICU on 10/2.  Was placed on levophed. Early AM on 10/3 was intubated for worsening metabolic/lactic acidosis. She had AKI as well. Crt was 6.01, her normal 0.9. Today Cr is 4 on CRRT.  Pertinent  Medical History  DM2 Depression OSA Migraines  Asthma Obesity Schizophrenia HTN HLD  Significant Hospital Events: Including procedures, antibiotic start and stop dates in addition to other pertinent events   9/26 Robotic hernia repair complicated by abd adhesions.  10/2 CT + for developing SBO and bilat lower lobe opacities most likely aspiration PNA. Progressive decompensation requiring transfer to ICU 10/2 PCCM consulted for respiratory failure & shock 10\3 Intubated for resp failure. CVL and arterial line placed. 04/30/22: EF 30-35%, RV moderately reduced, mild to moderate MR, IVC collapsed throughout respiratory cycle 10\4 EKG changes/trop increased--> and taken to cath lab - No obstructive CAD, but in decompensated heart failure, Bedside echo with EF 15%, new WMA, moderate RV dysfunction, trivial pericardial effusion.underwent emergent L/RHC. Normal coronaries, RA mean 13, PA mean 29, LVEDP 16, PCWP 19, CO/CI 3.96/1.87   returned to CICU post-cath. Right heart cath placed right fem.  Right IJ HD cath placed.  10/5 advanced HF team following, shock supported on norepi/vasopressin and Dobutamine. On CRRT.  10/6 weaning Norepi. Starting to wean fent. Needing restraints.   Interim History / Subjective:  Reintubated last evening for failing post-extubation. CI 2.8 this morning. Per cardiology, bedside echo demonstrated improving RV function, LV remains abnormal.  Objective   Blood pressure 118/77, pulse 100, temperature 97.8 F (36.6 C), temperature source Oral, resp. rate 15, height '5\' 4"'$  (1.626 m), weight 116.3 kg, last menstrual period 07/07/2014, SpO2 100 %. CVP:  [2 mmHg-70 mmHg] 8 mmHg  Vent Mode: PSV;CPAP FiO2 (%):  [40 %-60 %] 40 % Set Rate:  [24 bmp] 24 bmp Vt Set:  [430 mL] 430 mL PEEP:  [5 cmH20-8 cmH20] 5 cmH20 Pressure Support:  [5 cmH20] 5 cmH20 Plateau Pressure:  [14 cmH20-23 cmH20] 14 cmH20   Intake/Output Summary (Last 24 hours) at 05/06/2022 0905 Last data filed at 05/06/2022 0900 Gross per 24 hour  Intake 3047.72 ml  Output 3830 ml  Net -782.28 ml    Filed Weights   05/04/22 0500 05/05/22 0500 05/06/22 0500  Weight: 114.9 kg 115 kg 116.3 kg    Examination: General critically ill appearing woman lying in bed in NAD HENT [place]/AT, eyes anicteric  Pulm: faint rhales, no rhonchi or wheezing Card: mild dependent edema, no cyanosis Abd obese, soft, NT  Neuro RASS 0, blinking to threat, not tracking with her eyes or following commands  7.5/32/176/24 BUN 35 Cr 1.91 (on CRRT) WBC 43.77 H/H 9.8/28.2 Platelets 53 CXR personally reviewed> low lung volumes CT abd pelvis personally reviewed> RLL consolidation with air bronchograms, slight  opacification in LLL more likely atelectasis  LE [place]: no DVTs  Resolved problems:   Assessment & Plan:   Circulatory shock. Mixed picture of septic shock and acute biventricular HFrEF (suspect septic CM given clean coronaries)  -goal euvolemia -con't dobutamine -CT scan this morning concerning for both VAP and  possible infectious vs inflammatory enteritis.  Con't broad antibiotics.  -Con't CVP monitoring -Appreciate heart failure team's management  Acute hypoxic respiratory failure 2/2 aspiration pneumonia and compensation for metabolic acidosis. Previously failed extubation on 10/8, potentially due to weakness. Anticipate she may need tracheostomy.  RLL VAP -LTVV -VAP prevention protocol -PAD protocol for sedation -daily SAT & SBT as appropriate; worry that if this is due to NM weakness she may require tracheostomy for prolonged vent weaning -antibiotics for VAP  Oliguric AKI w/ AGMA 2/2 lactic acidosis  -strict I/O -renally dose meds, avoid nephrotoxic meds -monitor -con't CRRT; appreciate nephrology's management  Acute metabolic encephalopathy superimposed on h/o bipolar disease and schizophrenia  -PAD protocol for sedation -not on mood stabilizers at home; have not seen behavior that would indicate these are necessary at this point  S/p hernia repair c/b ileus vs SBO -con't bowel rest; starting trickle TF today per surgery -Con't TPN -Pad protocol; need to ensure adequate pain control -appreciate surgery's management   Worsening leukocytosis, does not seem that steroids fully explain this continuing to worsen.  -CT abd pelvis> possibly infectious enteritis and RLL pneumonia -con't broad antibiotics-- escalate zosyn to meropenem?  Thrombocytopenia, likely sepsis related.  HIT assay negative, no LE DVTs. -monitor -hold DVT prophylaxis until platelets recover -con't supportive care  Acute anemia, due to critical illness -transfuse for Hb <7 or hemodynamically significant bleeding -monitor  Hyperglycemia w/ h/o type II DM, improved control -con't TPN + insulin -SSI PRN -need to decrease insulin when steroids are discontinued  Severe protein energy malnutrition -TPN, starting trickle TF only today; monitor for intolerance  Best Practice (right click and "Reselect all  SmartList Selections" daily)   Diet/type: TPN DVT prophylaxis: SCD GI prophylaxis: PPI Lines: Central line Foley:  Yes, and it is still needed Code Status:  full code   This patient is critically ill with multiple organ system failure which requires frequent high complexity decision making, assessment, support, evaluation, and titration of therapies. This was completed through the application of advanced monitoring technologies and extensive interpretation of multiple databases. During this encounter critical care time was devoted to patient care services described in this note for 45 minutes.  Julian Hy, DO 05/06/22 2:23 PM [facility]

## 2022-05-06 NOTE — Progress Notes (Signed)
Pharmacy Antibiotic Note  Patricia [name] is a 51 y.o. female admitted on 04/23/2022 with sepsis.  Pharmacy has been consulted for vancomycin dosing. Patinet is on Zosyn per MD.   Pt presented for hernia repair, now with cardiogenic and septic shock requiring inotropes, vasopressors, and mechanical ventilation. Pt has acute renal failure requiring CRRT. WBC continues to increase to 43, afebrile. CT today showing RLL PNA and possibly infectious enteritis - discussed with CCM and will escalate from zosyn to meropenem.   Plan: Continue vancomycin 1000 mg every 24 hours while on CRRT Start meropenem 1g IV every 8 hours  F/u signs and symptoms of infection and cultures  Height: '5\' 4"'$  (162.6 cm) Weight: 116.3 kg (256 lb 6.3 oz) IBW/kg (Calculated) : 54.7  Temp (24hrs), Avg:97.3 F (36.3 C), Min:96.6 F (35.9 C), Max:97.8 F (36.6 C)  Recent Labs  Lab 05/02/22 0410 05/02/22 0832 05/02/22 1123 05/02/22 1531 05/02/22 1952 05/03/22 0411 05/03/22 1229 05/03/22 1523 05/03/22 1707 05/03/22 1720 05/04/22 0417 05/04/22 1905 05/05/22 0426 05/05/22 0614 05/05/22 1510 05/06/22 0415  WBC 7.9  --   --   --   --  19.0*  --   --   --   --  26.9*  --  34.9*  --   --  43.7*  CREATININE 4.02*  --   --    < >  --  2.55*  --   --    < >  --  1.90* 1.89*  --  1.97* 1.90* 1.91*  LATICACIDVEN  --    < > 3.9*  --  2.8*  --  2.0* 2.0*  --  1.9  --   --   --   --   --   --    < > = values in this interval not displayed.     Estimated Creatinine Clearance: 43.1 mL/min (A) (by C-G formula based on SCr of 1.91 mg/dL (H)).    Allergies  Allergen Reactions   Iodinated Contrast Media Shortness Of Breath, Other (See Comments) and Cough    Throat itching    Peanut-Containing Drug Products Anaphylaxis    Peanut Oil Only.  Can eat peanuts with no problems   Oxycodone-Acetaminophen Itching    Antimicrobials this admission: Zosyn 10/4 >> 10/9  Vanc 10/6 >>  Meropenem 10/9 >>  Dose adjustments this  admission: N/A  Microbiology results: 10/2 BCx: NGTD 10/3 BCxL NG x 2 days 10/2 MRSA PCR: neg 10/9 Sputum cx: sent   Thank you for allowing pharmacy to be a part of this patient's care.  Antonietta Jewel, PharmD, [place] Clinical Pharmacist  Phone: [phone] 05/06/2022 2:42 PM  Please check AMION for all [facility] phone numbers After 10:00 PM, call [place] [phone]

## 2022-05-07 DIAGNOSIS — A419 Sepsis, unspecified organism: Secondary | ICD-10-CM | POA: Diagnosis not present

## 2022-05-07 DIAGNOSIS — Z9911 Dependence on respirator [ventilator] status: Secondary | ICD-10-CM | POA: Diagnosis not present

## 2022-05-07 DIAGNOSIS — R570 Cardiogenic shock: Secondary | ICD-10-CM | POA: Diagnosis not present

## 2022-05-07 DIAGNOSIS — R6521 Severe sepsis with septic shock: Secondary | ICD-10-CM | POA: Diagnosis not present

## 2022-05-07 DIAGNOSIS — N179 Acute kidney failure, unspecified: Secondary | ICD-10-CM | POA: Diagnosis not present

## 2022-05-07 LAB — POCT I-STAT 7, (LYTES, BLD GAS, ICA,H+H)
Acid-Base Excess: 1 mmol/L (ref 0.0–2.0)
Bicarbonate: 23.7 mmol/L (ref 20.0–28.0)
Calcium, Ion: 1.08 mmol/L — ABNORMAL LOW (ref 1.15–1.40)
HCT: 26 % — ABNORMAL LOW (ref 36.0–46.0)
Hemoglobin: 8.8 g/dL — ABNORMAL LOW (ref 12.0–15.0)
O2 Saturation: 98 %
Patient temperature: 97.9
Potassium: 3.6 mmol/L (ref 3.5–5.1)
Sodium: 137 mmol/L (ref 135–145)
TCO2: 25 mmol/L (ref 22–32)
pCO2 arterial: 29.9 mmHg — ABNORMAL LOW (ref 32–48)
pH, Arterial: 7.506 — ABNORMAL HIGH (ref 7.35–7.45)
pO2, Arterial: 89 mmHg (ref 83–108)

## 2022-05-07 LAB — RENAL FUNCTION PANEL
Albumin: <1.5 g/dL — ABNORMAL LOW (ref 3.5–5.0)
Albumin: 1.5 g/dL — ABNORMAL LOW (ref 3.5–5.0)
Anion gap: 10 (ref 5–15)
Anion gap: 10 (ref 5–15)
BUN: 39 mg/dL — ABNORMAL HIGH (ref 6–20)
BUN: 40 mg/dL — ABNORMAL HIGH (ref 6–20)
CO2: 24 mmol/L (ref 22–32)
CO2: 24 mmol/L (ref 22–32)
Calcium: 7 mg/dL — ABNORMAL LOW (ref 8.9–10.3)
Calcium: 7.2 mg/dL — ABNORMAL LOW (ref 8.9–10.3)
Chloride: 103 mmol/L (ref 98–111)
Chloride: 102 mmol/L (ref 98–111)
Creatinine, Ser: 2.06 mg/dL — ABNORMAL HIGH (ref 0.44–1.00)
Creatinine, Ser: 1.93 mg/dL — ABNORMAL HIGH (ref 0.44–1.00)
GFR, Estimated: 28 mL/min — ABNORMAL LOW (ref >60)
GFR, Estimated: 31 mL/min — ABNORMAL LOW (ref >60)
Glucose, Bld: 210 mg/dL — ABNORMAL HIGH (ref 70–99)
Glucose, Bld: 261 mg/dL — ABNORMAL HIGH (ref 70–99)
Phosphorus: 4.3 mg/dL (ref 2.5–4.6)
Phosphorus: 3.8 mg/dL (ref 2.5–4.6)
Potassium: 4.1 mmol/L (ref 3.5–5.1)
Potassium: 3.8 mmol/L (ref 3.5–5.1)
Sodium: 137 mmol/L (ref 135–145)
Sodium: 136 mmol/L (ref 135–145)

## 2022-05-07 LAB — CBC WITH DIFFERENTIAL/PLATELET
Abs Immature Granulocytes: 6.96 10*3/uL — ABNORMAL HIGH (ref 0.00–0.07)
Basophils Absolute: 0.1 10*3/uL (ref 0.0–0.1)
Basophils Relative: 0 %
Eosinophils Absolute: 0 10*3/uL (ref 0.0–0.5)
Eosinophils Relative: 0 %
HCT: 25.5 % — ABNORMAL LOW (ref 36.0–46.0)
Hemoglobin: 8.6 g/dL — ABNORMAL LOW (ref 12.0–15.0)
Immature Granulocytes: 15 %
Lymphocytes Relative: 4 %
Lymphs Abs: 2 10*3/uL (ref 0.7–4.0)
MCH: 27.6 pg (ref 26.0–34.0)
MCHC: 33.7 g/dL (ref 30.0–36.0)
MCV: 81.7 fL (ref 80.0–100.0)
Monocytes Absolute: 2.4 10*3/uL — ABNORMAL HIGH (ref 0.1–1.0)
Monocytes Relative: 5 %
Neutro Abs: 35.8 10*3/uL — ABNORMAL HIGH (ref 1.7–7.7)
Neutrophils Relative %: 76 %
Platelets: 102 10*3/uL — ABNORMAL LOW (ref 150–400)
RBC: 3.12 MIL/uL — ABNORMAL LOW (ref 3.87–5.11)
RDW: 14.7 % (ref 11.5–15.5)
WBC: 47.3 10*3/uL — ABNORMAL HIGH (ref 4.0–10.5)
nRBC: 7.6 % — ABNORMAL HIGH (ref 0.0–0.2)

## 2022-05-07 LAB — GLUCOSE, CAPILLARY
Glucose-Capillary: 190 mg/dL — ABNORMAL HIGH (ref 70–99)
Glucose-Capillary: 199 mg/dL — ABNORMAL HIGH (ref 70–99)
Glucose-Capillary: 201 mg/dL — ABNORMAL HIGH (ref 70–99)
Glucose-Capillary: 208 mg/dL — ABNORMAL HIGH (ref 70–99)
Glucose-Capillary: 226 mg/dL — ABNORMAL HIGH (ref 70–99)
Glucose-Capillary: 249 mg/dL — ABNORMAL HIGH (ref 70–99)

## 2022-05-07 LAB — COOXEMETRY PANEL
Carboxyhemoglobin: 1.2 % (ref 0.5–1.5)
Methemoglobin: <0.7 % (ref 0.0–1.5)
O2 Saturation: 75.9 %
Total hemoglobin: 8.4 g/dL — ABNORMAL LOW (ref 12.0–16.0)

## 2022-05-07 LAB — SEROTONIN RELEASE ASSAY (SRA)
SRA .2 IU/mL UFH Ser-aCnc: <1 % (ref 0–20)
SRA 100IU/mL UFH Ser-aCnc: <1 % (ref 0–20)

## 2022-05-07 LAB — MAGNESIUM: Magnesium: 2.3 mg/dL (ref 1.7–2.4)

## 2022-05-07 MED ORDER — TRACE MINERALS CU-MN-SE-ZN 300-55-60-3000 MCG/ML IV SOLN
INTRAVENOUS | Status: AC
  Filled 2022-05-07: qty 828.8

## 2022-05-07 MED ORDER — HEPARIN SODIUM (PORCINE) 5000 UNIT/ML IJ SOLN
5000.0000 [IU] | Freq: Three times a day (TID) | INTRAMUSCULAR | Status: DC
  Administered 2022-05-07 – 2022-05-11 (×12): 5000 [IU] via SUBCUTANEOUS
  Filled 2022-05-07 (×12): qty 1

## 2022-05-07 MED ORDER — VITAL 1.5 CAL PO LIQD
1000.0000 mL | ORAL | Status: DC
  Administered 2022-05-08: 1000 mL

## 2022-05-07 NOTE — Progress Notes (Signed)
eLink Physician-Brief Progress Note Patient Name: Patricia [name] DOB: Jan 07, 1971 MRN: [id number]   Date of Service  05/07/2022  HPI/Events of Note  Patient with ventilator dyssynchrony on PRVC.  eICU Interventions  Patient switched to pressure control ventilation and sedation increased, she looks much more comfortable. ABG ordered.        Kerry Kass Avin Upperman 05/07/2022, 10:03 PM

## 2022-05-07 NOTE — Progress Notes (Signed)
Subjective/Chief Complaint: WBC up to 47. CT abd/pelvis yesterday shows enteritis, no fluid collections. Afebrile. Tolerating feeds at 20.   Objective: Vital signs in last 24 hours: Temp:  [97.1 F (36.2 C)-97.9 F (36.6 C)] 97.9 F (36.6 C) (10/10 0300) Pulse Rate:  [99-118] 103 (10/10 0630) Resp:  [14-31] 19 (10/10 0630) BP: (116-126)/(70-76) 126/76 (10/10 0350) SpO2:  [96 %-100 %] 98 % (10/10 0630) Arterial Line BP: (112-139)/(65-86) 126/71 (10/10 0630) FiO2 (%):  [30 %-40 %] 40 % (10/10 0350) Weight:  [112.5 kg] 112.5 kg (10/10 0352) Last BM Date : 05/06/22  Intake/Output from previous day: 10/09 0701 - 10/10 0700 In: 4223.2 [I.V.:2711.9; NG/GT:950; IV Piggyback:561.3] Out: 3607 [Urine:15; Emesis/NG output:50; Stool:50] Intake/Output this shift: Total I/O In: 4.3 [I.V.:4.3] Out: -   General: intubated, sedated, opens eyes CV: RRR HEENT: NG in place with feeds at 20 ml/hr; ETT Abdomen: soft, nondistended, incisions clean and dry with no erythema or induration.   Lab Results:  Recent Labs    05/06/22 0415 05/07/22 0428  WBC 43.7* 47.3*  HGB 9.8* 8.6*  HCT 28.2* 25.5*  PLT 53* 102*   BMET Recent Labs    05/06/22 1600 05/07/22 0428  NA 137 137  K 4.0 4.1  CL 102 103  CO2 26 24  GLUCOSE 222* 210*  BUN 36* 39*  CREATININE 1.99* 2.06*  CALCIUM 7.0* 7.0*   PT/INR No results for input(s): "LABPROT", "INR" in the last 72 hours. ABG Recent Labs    05/04/22 1700 05/05/22 2108  PHART  --  7.491*  HCO3 23.9 24.2    Studies/Results: CT ABDOMEN PELVIS WO CONTRAST  Result Date: 05/06/2022 CLINICAL DATA:  Sepsis, leukocytosis EXAM: CT ABDOMEN AND PELVIS WITHOUT CONTRAST TECHNIQUE: Multidetector CT imaging of the abdomen and pelvis was performed following the standard protocol without IV contrast. RADIATION DOSE REDUCTION: This exam was performed according to the departmental dose-optimization program which includes automated exposure control, adjustment  of the mA and/or kV according to patient size and/or use of iterative reconstruction technique. COMPARISON:  None Available. FINDINGS: Lower chest: Airspace opacities in both lower lobes with air bronchograms, right greater than left. This is similar to prior chest CT. No effusions. Hepatobiliary: No focal hepatic abnormality. Gallbladder unremarkable. Pancreas: No focal abnormality or ductal dilatation. Spleen: No focal abnormality.  Normal size. Adrenals/Urinary Tract: Foley catheter within the bladder which is decompressed. No renal or adrenal mass. No stones or hydronephrosis. Stomach/Bowel: NG tube within the stomach. Wall thickening within much of the proximal and mid small bowel compatible infectious or inflammatory enteritis. Distal small bowel decompressed and grossly unremarkable. Stomach and colon grossly unremarkable. No bowel obstruction. Vascular/Lymphatic: No evidence of aneurysm or adenopathy. Reproductive: Prior hysterectomy.  No adnexal masses. Other: Small amount of free fluid in the abdomen and pelvis. No free air. Musculoskeletal: No acute bony abnormality. IMPRESSION: Wall thickening throughout much of the proximal and mid small bowel compatible with infectious or inflammatory enteritis. Small amount of free fluid in the abdomen and pelvis. Bibasilar airspace opacities are similar to prior chest CT and could reflect atelectasis or pneumonia. NG tube in the stomach. Electronically Signed   By: Rolm Baptise M.D.   On: 05/06/2022 13:44   VAS [place] LOWER EXTREMITY VENOUS (DVT)  Result Date: 05/06/2022  Lower Venous DVT Study Patient Name:  GRACIELA PLATO [facility]  Date of Exam:   05/06/2022 Medical Rec #: [id number]           Accession #:  [id number] Date of Birth: 22-Dec-1970           Patient Gender: F Patient Age:   51 years Exam Location:  [facility] Procedure:      VAS [place] LOWER EXTREMITY VENOUS (DVT) Referring Phys: Kipp Brood  --------------------------------------------------------------------------------  Indications: Thrombocytopenia.  Limitations: Body habitus and poor ultrasound/tissue interface. Comparison Study: 04/29/22 negative lower extremity venous duplex Performing Technologist: Maudry Mayhew MHA, RDMS, RVT, RDCS  Examination Guidelines: A complete evaluation includes B-mode imaging, spectral Doppler, color Doppler, and power Doppler as needed of all accessible portions of each vessel. Bilateral testing is considered an integral part of a complete examination. Limited examinations for reoccurring indications may be performed as noted. The reflux portion of the exam is performed with the patient in reverse Trendelenburg.  +---------+---------------+---------+-----------+----------+--------------+ RIGHT    CompressibilityPhasicitySpontaneityPropertiesThrombus Aging +---------+---------------+---------+-----------+----------+--------------+ CFV      Full           Yes      Yes                                 +---------+---------------+---------+-----------+----------+--------------+ SFJ      Full                                                        +---------+---------------+---------+-----------+----------+--------------+ FV Prox  Full                                                        +---------+---------------+---------+-----------+----------+--------------+ FV Mid   Full                                                        +---------+---------------+---------+-----------+----------+--------------+ FV DistalFull                                                        +---------+---------------+---------+-----------+----------+--------------+ PFV      Full                                                        +---------+---------------+---------+-----------+----------+--------------+ POP      Full           Yes      Yes                                  +---------+---------------+---------+-----------+----------+--------------+ PTV      Full                                                        +---------+---------------+---------+-----------+----------+--------------+  PERO     Full                                                        +---------+---------------+---------+-----------+----------+--------------+   +---------+---------------+---------+-----------+----------+--------------+ LEFT     CompressibilityPhasicitySpontaneityPropertiesThrombus Aging +---------+---------------+---------+-----------+----------+--------------+ CFV      Full           Yes      Yes                                 +---------+---------------+---------+-----------+----------+--------------+ SFJ      Full                                                        +---------+---------------+---------+-----------+----------+--------------+ FV Prox  Full                                                        +---------+---------------+---------+-----------+----------+--------------+ FV Mid   Full                                                        +---------+---------------+---------+-----------+----------+--------------+ FV DistalFull                                                        +---------+---------------+---------+-----------+----------+--------------+ PFV      Full                                                        +---------+---------------+---------+-----------+----------+--------------+ POP      Full           Yes      Yes                                 +---------+---------------+---------+-----------+----------+--------------+ PTV      Full                                                        +---------+---------------+---------+-----------+----------+--------------+ PERO     Full                                                         +---------+---------------+---------+-----------+----------+--------------+  Summary: RIGHT: - There is no evidence of deep vein thrombosis in the lower extremity.  - No cystic structure found in the popliteal fossa.  LEFT: - There is no evidence of deep vein thrombosis in the lower extremity.  - No cystic structure found in the popliteal fossa.  *See table(s) above for measurements and observations. Electronically signed by Monica Martinez MD on 05/06/2022 at 11:54:44 AM.    Final    DG CHEST PORT 1 VIEW  Result Date: 05/05/2022 CLINICAL DATA:  [id number] Encounter for intubation [id number] EXAM: PORTABLE CHEST 1 VIEW COMPARISON:  Chest x-ray 05/05/2022 6:06 a.m., CT chest 04/30/2022 FINDINGS: Endotracheal tube with tip terminating 3 cm above the carina. Enteric tube coursing below the hemidiaphragm with tip and side port overlying the expected region of the gastric lumen. Right internal jugular central venous catheter with tip overlying the expected region of superior cavoatrial junction. The heart and mediastinal contours are unchanged. Low lung volumes. Bilateral bases streaky airspace opacities likely representing atelectasis. No pulmonary edema. No definite pleural effusion. No pneumothorax. No acute osseous abnormality. IMPRESSION: Low lung volumes with bibasilar streaky airspace opacities likely representing atelectasis. Superimposed infection/inflammation not excluded. Lines and tubes in appropriate position. Electronically Signed   By: Iven Finn M.D.   On: 05/05/2022 19:22    Anti-infectives: Anti-infectives (From admission, onward)    Start     Dose/Rate Route Frequency Ordered Stop   05/06/22 1530  meropenem (MERREM) 1 g in sodium chloride 0.9 % 100 mL IVPB        1 g 200 mL/hr over 30 Minutes Intravenous Every 8 hours 05/06/22 1438     05/04/22 1000  vancomycin (VANCOCIN) IVPB 1000 mg/200 mL premix        1,000 mg 200 mL/hr over 60 Minutes Intravenous Every 24 hours 05/03/22 0802      05/03/22 0845  vancomycin (VANCOREADY) IVPB 2000 mg/400 mL        2,000 mg 200 mL/hr over 120 Minutes Intravenous  Once 05/03/22 0751 05/03/22 1114   05/01/22 1800  piperacillin-tazobactam (ZOSYN) IVPB 3.375 g  Status:  Discontinued        3.375 g 12.5 mL/hr over 240 Minutes Intravenous Every 6 hours 05/01/22 1426 05/01/22 1427   05/01/22 1515  piperacillin-tazobactam (ZOSYN) IVPB 3.375 g  Status:  Discontinued        3.375 g 100 mL/hr over 30 Minutes Intravenous Every 6 hours 05/01/22 1428 05/06/22 1438   04/30/22 1400  piperacillin-tazobactam (ZOSYN) IVPB 2.25 g  Status:  Discontinued        2.25 g 100 mL/hr over 30 Minutes Intravenous Every 8 hours 04/30/22 1325 05/01/22 1426   04/29/22 1530  piperacillin-tazobactam (ZOSYN) IVPB 3.375 g  Status:  Discontinued        3.375 g 12.5 mL/hr over 240 Minutes Intravenous Every 8 hours 04/29/22 1433 04/30/22 1325   04/29/22 1145  metroNIDAZOLE (FLAGYL) IVPB 500 mg  Status:  Discontinued        500 mg 100 mL/hr over 60 Minutes Intravenous Every 12 hours 04/29/22 1047 04/29/22 1433   04/29/22 1145  vancomycin (VANCOCIN) IVPB 1000 mg/200 mL premix  Status:  Discontinued        1,000 mg 200 mL/hr over 60 Minutes Intravenous  Once 04/29/22 1047 04/29/22 1055   04/29/22 1145  vancomycin (VANCOREADY) IVPB 1500 mg/300 mL  Status:  Discontinued        1,500 mg 150 mL/hr over 120 Minutes Intravenous Every 24 hours 04/29/22 1055  04/29/22 1357   04/29/22 1045  ceFEPIme (MAXIPIME) 2 g in sodium chloride 0.9 % 100 mL IVPB  Status:  Discontinued        2 g 200 mL/hr over 30 Minutes Intravenous Every 8 hours 04/29/22 0954 04/29/22 1433   04/23/22 0730  ceFAZolin (ANCEF) IVPB 2g/100 mL premix        2 g 200 mL/hr over 30 Minutes Intravenous On call to O.R. 04/23/22 [id number] 04/23/22 0175       Assessment/Plan: POD 14 s/p robotic incisional hernia repair -Postop ileus complicated by aspiration: Ileus resolved, having bowel function.  -CT abd/pelvis  reviewed. Proximal small bowel is thickened but there is no pneumatosis or portal venous gas to suggest ischemia.  - Ok to continue slow feed advancement to 30 ml/hr today. Continue TPN.   Cardiogenic/septic shock -Antibiotics broadened to meropenem and vanc given rising leukocytosis. -cath 10/4 - no obstructive CAD -Pressors have been off for several days, remains on dobutamine  AKI -CRRT  Resp failure -Vent management per CCM. May ultimately require trach if unable to wean from vent.   Michaelle Birks, MD [facility] Surgery General, Hepatobiliary and Pancreatic Surgery 05/07/22 7:54 AM

## 2022-05-07 NOTE — Progress Notes (Addendum)
Patient ID: Patricia [name], female   DOB: 11-28-1970, 51 y.o.   MRN: [id number] S: No events overnight.  Pt seen and examined while on CRRT and labs and orders reviewed and adjustments made accordingly.  O:BP 126/76   Pulse (!) 104   Temp 97.6 F (36.4 C) (Oral)   Resp (!) 22   Ht '5\' 4"'$  (1.626 m)   Wt 112.5 kg   LMP 07/07/2014 (Approximate)   SpO2 98%   BMI 42.57 kg/m   Intake/Output Summary (Last 24 hours) at 05/07/2022 1120 Last data filed at 05/07/2022 1100 Gross per 24 hour  Intake 3712.7 ml  Output 3322 ml  Net 390.7 ml   Intake/Output: I/O last 3 completed shifts: In: 5873.8 [I.V.:4162.6; NG/GT:950; IV Piggyback:761.3] Out: 5745 [Urine:20; Emesis/NG output:50; Stool:188]  Intake/Output this shift:  Total I/O In: 623.3 [I.V.:387.1; NG/GT:89.5; IV Piggyback:146.8] Out: 515 [Urine:3] Weight change: -3.8 kg WUJ:WJXBJYNWG and sedated NFA:OZHYQ Resp: ventilated BS bilaterally Abd: +BS, soft, NT/ND Ext: no edema  Recent Labs  Lab 04/30/22 1301 04/30/22 2108 05/01/22 0416 05/01/22 1600 05/01/22 2325 05/02/22 0409 05/03/22 1707 05/03/22 1846 05/04/22 0417 05/04/22 1700 05/04/22 1905 05/05/22 0614 05/05/22 1510 05/05/22 2108 05/06/22 0415 05/06/22 1600 05/07/22 0428  NA 139   < > 133*  136   < > 133*   < > 136   < > 138   < > 135 138 136 136 136 137 137  K 4.7   < > 3.4*  3.5   < > 3.4*   < > 3.7   < > 3.3*   < > 3.7 3.6 3.4* 3.5 4.1 4.0 4.1  CL 102  --  97*  97*   < > 101   < > 103  --  106  --  103 106 104  --  104 102 103  CO2 18*  --  15*  16*   < > 15*   < > 23  --  24  --  22 23 21*  --  '23 26 24  '$ GLUCOSE 189*  --  190*  191*   < > 240*   < > 228*  --  207*  --  216* 189* 214*  --  183* 222* 210*  BUN 54*  --  71*  69*   < > 62*   < > 29*  --  32*  --  35* 34* 35*  --  35* 36* 39*  CREATININE 4.70*  --  6.01*  6.13*   < > 4.93*   < > 2.25*  --  1.90*  --  1.89* 1.97* 1.90*  --  1.91* 1.99* 2.06*  ALBUMIN 2.3*  --  2.3*   < > 1.6*   < > 1.5*  --   <1.5*  --  1.5* <1.5* <1.5*  --  <1.5* 1.5* <1.5*  CALCIUM 6.0*  --  6.6*  6.6*   < > 5.5*   < > 6.7*  --  6.5*  --  6.8* 7.0* 7.0*  --  7.0* 7.0* 7.0*  PHOS  --   --  6.3*   < >  --    < > 3.3  --  2.1*  --  2.7  --  2.4*  --  3.3 3.4 4.3  AST 513*  --  1,115*  --  700*  --   --   --   --   --   --  331*  --   --   --   --   --  ALT 293*  --  632*  --  439*  --   --   --   --   --   --  248*  --   --   --   --   --    < > = values in this interval not displayed.   Liver Function Tests: Recent Labs  Lab 05/01/22 0416 05/01/22 1600 05/01/22 2325 05/02/22 0410 05/05/22 0614 05/05/22 1510 05/06/22 0415 05/06/22 1600 05/07/22 0428  AST 1,115*  --  700*  --  331*  --   --   --   --   ALT 632*  --  439*  --  248*  --   --   --   --   ALKPHOS 62  --  125  --  167*  --   --   --   --   BILITOT 1.8*  --  1.7*  --  1.3*  --   --   --   --   PROT 5.0*  --  4.5*  --  4.6*  --   --   --   --   ALBUMIN 2.3*   < > 1.6*   < > <1.5*   < > <1.5* 1.5* <1.5*   < > = values in this interval not displayed.   No results for input(s): "LIPASE", "AMYLASE" in the last 168 hours. No results for input(s): "AMMONIA" in the last 168 hours. CBC: Recent Labs  Lab 05/03/22 0411 05/03/22 1846 05/04/22 0417 05/04/22 1700 05/05/22 0426 05/05/22 2108 05/06/22 0415 05/07/22 0428  WBC 19.0*  --  26.9*  --  34.9*  --  43.7* 47.3*  NEUTROABS  --    < > 22.6*  --  31.4*  --  35.1* 35.8*  HGB 9.5*   < > 8.9*   < > 9.4* 10.2* 9.8* 8.6*  HCT 29.1*   < > 26.7*   < > 27.8* 30.0* 28.2* 25.5*  MCV 82.7  --  82.4  --  83.5  --  79.4* 81.7  PLT 38*  --  27*  --  39*  --  53* 102*   < > = values in this interval not displayed.   Cardiac Enzymes: No results for input(s): "CKTOTAL", "CKMB", "CKMBINDEX", "TROPONINI" in the last 168 hours. CBG: Recent Labs  Lab 05/06/22 1136 05/06/22 1653 05/06/22 1955 05/06/22 2338 05/07/22 0407  GLUCAP 139* 134* 154* 196* 199*    Iron Studies: No results for input(s):  "IRON", "TIBC", "TRANSFERRIN", "FERRITIN" in the last 72 hours. Studies/Results: CT ABDOMEN PELVIS WO CONTRAST  Result Date: 05/06/2022 CLINICAL DATA:  Sepsis, leukocytosis EXAM: CT ABDOMEN AND PELVIS WITHOUT CONTRAST TECHNIQUE: Multidetector CT imaging of the abdomen and pelvis was performed following the standard protocol without IV contrast. RADIATION DOSE REDUCTION: This exam was performed according to the departmental dose-optimization program which includes automated exposure control, adjustment of the mA and/or kV according to patient size and/or use of iterative reconstruction technique. COMPARISON:  None Available. FINDINGS: Lower chest: Airspace opacities in both lower lobes with air bronchograms, right greater than left. This is similar to prior chest CT. No effusions. Hepatobiliary: No focal hepatic abnormality. Gallbladder unremarkable. Pancreas: No focal abnormality or ductal dilatation. Spleen: No focal abnormality.  Normal size. Adrenals/Urinary Tract: Foley catheter within the bladder which is decompressed. No renal or adrenal mass. No stones or hydronephrosis. Stomach/Bowel: NG tube within the stomach. Wall thickening within much of the proximal and mid small bowel  compatible infectious or inflammatory enteritis. Distal small bowel decompressed and grossly unremarkable. Stomach and colon grossly unremarkable. No bowel obstruction. Vascular/Lymphatic: No evidence of aneurysm or adenopathy. Reproductive: Prior hysterectomy.  No adnexal masses. Other: Small amount of free fluid in the abdomen and pelvis. No free air. Musculoskeletal: No acute bony abnormality. IMPRESSION: Wall thickening throughout much of the proximal and mid small bowel compatible with infectious or inflammatory enteritis. Small amount of free fluid in the abdomen and pelvis. Bibasilar airspace opacities are similar to prior chest CT and could reflect atelectasis or pneumonia. NG tube in the stomach. Electronically Signed   By:  Rolm Baptise M.D.   On: 05/06/2022 13:44   VAS [place] LOWER EXTREMITY VENOUS (DVT)  Result Date: 05/06/2022  Lower Venous DVT Study Patient Name:  Patricia [name] [facility]  Date of Exam:   05/06/2022 Medical Rec #: [id number]           Accession #:    [id number] Date of Birth: 06/27/1971           Patient Gender: F Patient Age:   26 years Exam Location:  [facility] Procedure:      VAS [place] LOWER EXTREMITY VENOUS (DVT) Referring Phys: Kipp Brood --------------------------------------------------------------------------------  Indications: Thrombocytopenia.  Limitations: Body habitus and poor ultrasound/tissue interface. Comparison Study: 04/29/22 negative lower extremity venous duplex Performing Technologist: Maudry Mayhew MHA, RDMS, RVT, RDCS  Examination Guidelines: A complete evaluation includes B-mode imaging, spectral Doppler, color Doppler, and power Doppler as needed of all accessible portions of each vessel. Bilateral testing is considered an integral part of a complete examination. Limited examinations for reoccurring indications may be performed as noted. The reflux portion of the exam is performed with the patient in reverse Trendelenburg.  +---------+---------------+---------+-----------+----------+--------------+ RIGHT    CompressibilityPhasicitySpontaneityPropertiesThrombus Aging +---------+---------------+---------+-----------+----------+--------------+ CFV      Full           Yes      Yes                                 +---------+---------------+---------+-----------+----------+--------------+ SFJ      Full                                                        +---------+---------------+---------+-----------+----------+--------------+ FV Prox  Full                                                        +---------+---------------+---------+-----------+----------+--------------+ FV Mid   Full                                                         +---------+---------------+---------+-----------+----------+--------------+ FV DistalFull                                                        +---------+---------------+---------+-----------+----------+--------------+  PFV      Full                                                        +---------+---------------+---------+-----------+----------+--------------+ POP      Full           Yes      Yes                                 +---------+---------------+---------+-----------+----------+--------------+ PTV      Full                                                        +---------+---------------+---------+-----------+----------+--------------+ PERO     Full                                                        +---------+---------------+---------+-----------+----------+--------------+   +---------+---------------+---------+-----------+----------+--------------+ LEFT     CompressibilityPhasicitySpontaneityPropertiesThrombus Aging +---------+---------------+---------+-----------+----------+--------------+ CFV      Full           Yes      Yes                                 +---------+---------------+---------+-----------+----------+--------------+ SFJ      Full                                                        +---------+---------------+---------+-----------+----------+--------------+ FV Prox  Full                                                        +---------+---------------+---------+-----------+----------+--------------+ FV Mid   Full                                                        +---------+---------------+---------+-----------+----------+--------------+ FV DistalFull                                                        +---------+---------------+---------+-----------+----------+--------------+ PFV      Full                                                         +---------+---------------+---------+-----------+----------+--------------+  POP      Full           Yes      Yes                                 +---------+---------------+---------+-----------+----------+--------------+ PTV      Full                                                        +---------+---------------+---------+-----------+----------+--------------+ PERO     Full                                                        +---------+---------------+---------+-----------+----------+--------------+     Summary: RIGHT: - There is no evidence of deep vein thrombosis in the lower extremity.  - No cystic structure found in the popliteal fossa.  LEFT: - There is no evidence of deep vein thrombosis in the lower extremity.  - No cystic structure found in the popliteal fossa.  *See table(s) above for measurements and observations. Electronically signed by Monica Martinez MD on 05/06/2022 at 11:54:44 AM.    Final    DG CHEST PORT 1 VIEW  Result Date: 05/05/2022 CLINICAL DATA:  [id number] Encounter for intubation [id number] EXAM: PORTABLE CHEST 1 VIEW COMPARISON:  Chest x-ray 05/05/2022 6:06 a.m., CT chest 04/30/2022 FINDINGS: Endotracheal tube with tip terminating 3 cm above the carina. Enteric tube coursing below the hemidiaphragm with tip and side port overlying the expected region of the gastric lumen. Right internal jugular central venous catheter with tip overlying the expected region of superior cavoatrial junction. The heart and mediastinal contours are unchanged. Low lung volumes. Bilateral bases streaky airspace opacities likely representing atelectasis. No pulmonary edema. No definite pleural effusion. No pneumothorax. No acute osseous abnormality. IMPRESSION: Low lung volumes with bibasilar streaky airspace opacities likely representing atelectasis. Superimposed infection/inflammation not excluded. Lines and tubes in appropriate position. Electronically Signed   By: Iven Finn M.D.    On: 05/05/2022 19:22    Chlorhexidine Gluconate Cloth  6 each Topical Daily   docusate  100 mg Per Tube BID   heparin injection (subcutaneous)  5,000 Units Subcutaneous Q8H   insulin aspart  0-20 Units Subcutaneous Q4H   insulin aspart  7 Units Subcutaneous Q4H   mouth rinse  15 mL Mouth Rinse Q2H   pantoprazole (PROTONIX) IV  40 mg Intravenous Q24H   polyethylene glycol  17 g Per Tube Daily   sodium chloride flush  10-40 mL Intracatheter Q12H   sodium chloride flush  3 mL Intravenous Q12H    BMET    Component Value Date/Time   NA 137 05/07/2022 0428   NA 139 07/02/2014 1923   K 4.1 05/07/2022 0428   K 3.9 07/02/2014 1923   CL 103 05/07/2022 0428   CL 105 07/02/2014 1923   CO2 24 05/07/2022 0428   CO2 26 07/02/2014 1923   GLUCOSE 210 (H) 05/07/2022 0428   GLUCOSE 95 07/02/2014 1923   BUN 39 (H) 05/07/2022 0428   BUN 7 07/02/2014 1923   CREATININE 2.06 (H) 05/07/2022 0428   CREATININE  0.98 07/02/2014 1923   CALCIUM 7.0 (L) 05/07/2022 0428   CALCIUM 8.5 07/02/2014 1923   GFRNONAA 28 (L) 05/07/2022 0428   GFRNONAA >60 07/02/2014 1923   GFRAA >60 04/22/2018 2215   GFRAA >60 07/02/2014 1923   CBC    Component Value Date/Time   WBC 47.3 (H) 05/07/2022 0428   RBC 3.12 (L) 05/07/2022 0428   HGB 8.6 (L) 05/07/2022 0428   HGB 9.4 (L) 07/02/2014 1923   HCT 25.5 (L) 05/07/2022 0428   HCT 30.3 (L) 07/02/2014 1923   PLT 102 (L) 05/07/2022 0428   PLT 449 (H) 07/02/2014 1923   MCV 81.7 05/07/2022 0428   MCV 71 (L) 07/02/2014 1923   MCH 27.6 05/07/2022 0428   MCHC 33.7 05/07/2022 0428   RDW 14.7 05/07/2022 0428   RDW 18.8 (H) 07/02/2014 1923   LYMPHSABS 2.0 05/07/2022 0428   MONOABS 2.4 (H) 05/07/2022 0428   EOSABS 0.0 05/07/2022 0428   BASOSABS 0.1 05/07/2022 0428    Assessment/Plan:   AKI - likely ATN in the setting of cardiogenic and septic shock.  Started on CRRT 05/01/22 after RIJ temp HD catheter placed. All fluids 4K/2.5Ca, pre and post-filter rates at 400 mL/hr,  dialysate rate 1500 mL/hr, no anticoagulation due to thrombocytopenia.  UF goal for CVP of 7-9 mmHg.  To keep even today due to low CVP Remains anuric, continue with CRRT as ordered. Cardiogenic/septic shock - weaned off levophed, on dobutamine.  Heart failure team following. Likely due to aspiration pneumonia. Acute hypoxic respiratory failure - was re-intubated 05/05/22.  Vent per PCCM and possible trach. Infectious enteritis - seen on CT scan.  WBC continues to climb.  Plan per PCCM. Anemia of critical illness - transfuse for Hgb <7. Acute MI - normal coronaries by cath [facility] - improved with CRRT. Thrombocytopenia - improving.  S/p robotic incisional hernia repair with mesh and LOA - Surgery following.   Donetta Potts, MD [facility]

## 2022-05-07 NOTE — Progress Notes (Signed)
PHARMACY - TOTAL PARENTERAL NUTRITION CONSULT NOTE  Indication: Small bowel obstruction and ileus  Patient Measurements: Height: '5\' 4"'  (162.6 cm) Weight: 112.5 kg (248 lb 0.3 oz) IBW/kg (Calculated) : 54.7 TPN AdjBW (KG): 68.4 Body mass index is 42.57 kg/m.  Assessment:  51 yo female s/p difficult incisional hernia repair with long LoA on 9/26.  Patient developed a SBO based on CT scan 10/2.  Poor PO intake since surgery due to abdominal pain.  Pharmacy consulted to manage TPN.  Patient was extubated and then re-intubated on 10/8 due to respiratory distress.  Glucose / Insulin: A1c 6.3% - CBGs 130-220s Methylprednisolone started 10/2 > Hydrocortisone 184m BID (d/c'd 10/10) Used 21 units rSSI (down), Novolog 7 units Q4H, 40 units in TPN Electrolytes: K 4.1 post 2 runs, coCa 9, others WNL (Phos 4.3 on KPhos Neutral 5073mPT BID - d/c'd 10/9) Renal: CRRT started 10/4, BUN 39 Hepatic: AST/ALT down 331/248, alk phos up to 167, tbili down to 1.3, TG elevated at 374 (off Propofol 10/4) Intake / Output; MIVF: anuric, NG 5047mstool 39m57mRRT 3.5L, net +18.3L GI Imaging: 10/3 CT: continued SBO  10/2 CT: SBO 10/9 CT: infectious or inflammatory enteritis GI Surgeries / Procedures: none since TPN initiation   Central access: CVC triple lumen placed 05/04/22 TPN start date: 04/29/22  Nutritional Goals: RD Estimated Needs Total Energy Estimated Needs: 1800-2000 kcal/d Total Protein Estimated Needs: 110-130 grams Total Fluid Estimated Needs: 1.8-2L/d  Current Nutrition:  TPN TF (Vital 1.5) @ 20ml98m(providing 32g protein, 720 kcal per 24 hrs) (goal rate 39ml/31m Plan:  Continue concentrated TPN at 70 ml/hr to provide 124g AA and 1848 kCal, meeting 100% of needs.  Lipids at 20% of kCal (added back 10/6). Electrolytes in TPN: Na to 125mEq/41m 0mEq/L,52m 6mEq/L, 43m0mEq/L, P33m increased to 22mmol/L, 66mc 1:2 Add standard MVI and trace elements to TPN, chromium removed for CRRT   Continue to replace K outside of TPN  KPhosNeutral discontinued  Continue resistant SSI Q4H and regular insulin at 40 units in TPN Novolog 7 units Q4H per CCM Renal function panel BID and daily Mag while on CRRT per Renal Monitor BG with discontinuation of steroids and as TFs slowly advancing to goal (increasing to 30ml/hr) Re49m TG on Thurs 10/12  Jocelynne Duquette JardinLuisa HartPS Clinical Pharmacist 05/07/2022 10:25 AM   Please refer to AMION for phSilver [facility] Center LLCy phone number

## 2022-05-07 NOTE — Progress Notes (Signed)
Have weaned sedation to minimal.  Patient is waking up but still not following commands.  Moving all extremities and looking around room.  No tracking, grips, or plantar flexion.

## 2022-05-07 NOTE — Progress Notes (Signed)
NAME:  Patricia [name], MRN:  [id number], DOB:  [birth year], LOS: 35 ADMISSION DATE:  04/23/2022, CONSULTATION DATE:  04/29/2022 REFERRING MD:  Felicie Morn, MD, CHIEF COMPLAINT:   Small bowel Obstruction, aspiration event, Sepsis, Respiratory Failure    History of Present Illness:  Ms. Patricia [name] with above hx and nuc study in 2015 neg for ischemia done for chest pain and echo with EF 60-65% presented to hospital for excisional hernia repair (robotic hernia repair 04/23/22) with mesh, ileus post op on TNA. Developed vomiting and hypotension after NG came out on 04/29/22 and PNA on CXR, was tachycardic and BP improved with IV fluids. DX with sepsis and ABX started. At one point prolonged Qtc. Pt admitted to ICU on 10/2.  Was placed on levophed. Early AM on 10/3 was intubated for worsening metabolic/lactic acidosis. She had AKI as well. Crt was 6.01, her normal 0.9. Today Cr is 4 on CRRT.  Pertinent  Medical History  DM2 Depression OSA Migraines  Asthma Obesity Schizophrenia HTN HLD  Significant Hospital Events: Including procedures, antibiotic start and stop dates in addition to other pertinent events   9/26 Robotic hernia repair complicated by abd adhesions.  10/2 CT + for developing SBO and bilat lower lobe opacities most likely aspiration PNA. Progressive decompensation requiring transfer to ICU 10/2 PCCM consulted for respiratory failure & shock 10\3 Intubated for resp failure. CVL and arterial line placed. 04/30/22: EF 30-35%, RV moderately reduced, mild to moderate MR, IVC collapsed throughout respiratory cycle 10\4 EKG changes/trop increased--> and taken to cath lab - No obstructive CAD, but in decompensated heart failure, Bedside echo with EF 15%, new WMA, moderate RV dysfunction, trivial pericardial effusion.underwent emergent L/RHC. Normal coronaries, RA mean 13, PA mean 29, LVEDP 16, PCWP 19, CO/CI 3.96/1.87   returned to CICU post-cath. Right heart cath placed right fem.  Right IJ HD cath placed.  10/5 advanced HF team following, shock supported on norepi/vasopressin and Dobutamine. On CRRT.  10/6 weaning Norepi. Starting to wean fent. Needing restraints.  10/8: Reintubated due to neuromuscular weakness.  Bedside echo demonstrating improved RV function, LV function remained depressed 10/9 CT abdomen pelvis.suggesting wall thickening throughout the proximal and mid small bowel consistent with possible infectious or inflammatory enteritis there is also bibasilar airspace disease which could reflect pneumonia.  Antibiotic coverage widened.  Zosyn changed to meropenem.  Tolerating trickle feeds still on TPN 10/10 stopped stress dose steroids.  Resumed subcutaneous heparin.  Interim History / Subjective:  Weaning but fatigue  Objective   Blood pressure 126/76, pulse (Abnormal) 104, temperature 97.6 F (36.4 C), temperature source Oral, resp. rate (Abnormal) 22, height '5\' 4"'$  (1.626 m), weight 112.5 kg, last menstrual period 07/07/2014, SpO2 97 %. CVP:  [0 mmHg-16 mmHg] 6 mmHg  Vent Mode: CPAP;PSV FiO2 (%):  [30 %-40 %] 40 % Set Rate:  [24 bmp] 24 bmp Vt Set:  [430 mL] 430 mL PEEP:  [5 cmH20] 5 cmH20 Pressure Support:  [5 cmH20] 5 cmH20   Intake/Output Summary (Last 24 hours) at 05/07/2022 0951 Last data filed at 05/07/2022 0900 Gross per 24 hour  Intake 4200.74 ml  Output 3471 ml  Net 729.74 ml   Filed Weights   05/05/22 0500 05/06/22 0500 05/07/22 0352  Weight: 115 kg 116.3 kg 112.5 kg    Examination:  General: This is a 51 year old female patient she is currently on pressure support ventilation with adequate F/VT indices however does appear to exhibit some increased work of breathing HEENT normocephalic atraumatic  orally intubated Pulmonary: Diminished bases some scattered rhonchi tidal volume in the 500 range with frequency in 30s mild accessory use on pressure support of 5 Cardiac: Tachycardic rhythm without murmur rub or gallop current CVP  hovering around 5 Abdomen: Soft not tender Extremities: Warm dry bilateral upper and lower extremity edema Neuro: Awake, opens eyes, attempts to try to communicate however not following commands currently she is profoundly weak GU minimal urine output  Resolved problems:   Assessment & Plan:   Circulatory shock. Mixed picture of septic shock and acute biventricular HFrEF (suspect septic CM given clean coronaries)  Co-ox at goal Off pressors Plan Antibiotics as outlined below Keep euvolemic Inotropic support as directed by heart failure   Acute hypoxic respiratory failure 2/2 aspiration pneumonia and compensation for metabolic acidosis. Previously failed extubation on 10/8, potentially due to weakness. Anticipate she may need tracheostomy.  RLL VAP It appears as though the etiology for respiratory failure was more generalized weakness and deconditioning and unlikely an acute pulmonary process  plan Continuing pressure support ventilation RASS goal 0 to -1 VAP bundle Plan for tracheostomy 10/11 family has agreed and will consent  Oliguric AKI w/ AGMA 2/2 lactic acidosis  Plan Continue CRRT  Acute metabolic encephalopathy superimposed on h/o bipolar disease and schizophrenia  Plan Continuing PAD protocol RASS goal 0 Not currently on her home mood stabilizers but may need to address this later   S/p hernia repair c/b ileus vs SBO Plan Continuing TPN and tube feeds as directed by surgery As needed analgesia    Worsening leukocytosis, initially we thought secondary to steroids, however CT imaging suggesting wall thickening throughout the proximal and mid small bowel consistent with possible infectious or inflammatory enteritis there is also bibasilar airspace disease which could reflect pneumonia Plan Meropenem day #2, vancomycin day #4, all cultures are negative to date we will continue to monitor Repeat a.m. CBC  Thrombocytopenia, likely sepsis related.  HIT assay  negative, no LE DVTs. -Platelets improved Plan We will resume subcutaneous heparin A.m. CBC  Acute anemia, due to critical illness Hemoglobin drifting down but no evidence of bleeding  plan Trend CBC Trigger for transfusion is less than 7  Hyperglycemia w/ h/o type II DM Plan Continuing TPN with insulin Sliding scale insulin Stopping stress dose steroids Will continue tube feed coverage but reduced to 5 units every 4   Severe protein energy malnutrition Plan Continuing trickle feeds.  Currently at 30 mL an hour Continuing TPN   Best Practice (right click and "Reselect all SmartList Selections" daily)   Diet/type: TPN DVT prophylaxis: SCD GI prophylaxis: PPI Lines: Central line Foley:  Yes, and it is still needed Code Status:  full code My critical care time is 32 minutes  Erick Colace ACNP-BC [place] Pager # [phone] OR # [phone] if no answer

## 2022-05-07 NOTE — Progress Notes (Signed)
Brief Nutrition Note  Discussed pt with Surgery MD. Pt tolerating Vital 1.5 tube feeds at 20 ml/hr via OG tube without issue. Per Surgery, okay to advance Vital 1.5 tube feeds to rate of 30 ml/hr today. Plan is to continue TPN at this time.  RD to adjust tube feeding orders: - Vital 1.5 @ 30 ml/hr (720 ml/day) via OG tube  This tube feeding regimen provides 1080 kcal, 49 grams of protein, and 550 ml of H2O.    RD will continue to monitor for ability to advance tube feeds to goal regimen: - Vital 1.5 @ 50 ml/hr (1200 ml/day) - PROSource TF 60 ml BID   Tube feeding regimen at goal rate provides 1960 kcal, 121 grams of protein, and 917 ml of H2O.  - Recommend continuing TPN at goal rate at this time   Gustavus Bryant, MS, RD, LDN Inpatient Clinical Dietitian Please see AMiON for contact information.

## 2022-05-07 NOTE — Progress Notes (Addendum)
Advanced Heart Failure Rounding Note  PCP-Cardiologist: None   Subjective:    Remains on vent, RN weaning sedation. CRRT running even. CVP 4-5   On DBA 5.  Co-ox 76%. RV improving on bedside TTE yesterday.  WBC still trending up 44>>47K. Antibiotics switched to meropenum and vanc.   CT A/P yesterday findings c/w infectious or inflammatory enteritis.  LE Venous dopplers negative for DVT Tracheal aspirate cx pending    PLTs continue to improve, 53>>102K    Objective:   Weight Range: 112.5 kg Body mass index is 42.57 kg/m.   Vital Signs:   Temp:  [97.1 F (36.2 C)-97.9 F (36.6 C)] 97.9 F (36.6 C) (10/10 0300) Pulse Rate:  [99-118] 103 (10/10 0630) Resp:  [14-31] 19 (10/10 0630) BP: (116-126)/(70-76) 126/76 (10/10 0350) SpO2:  [96 %-100 %] 98 % (10/10 0630) Arterial Line BP: (112-139)/(65-86) 126/71 (10/10 0630) FiO2 (%):  [30 %-40 %] 40 % (10/10 0350) Weight:  [112.5 kg] 112.5 kg (10/10 0352) Last BM Date : 05/06/22  Weight change: Filed Weights   05/05/22 0500 05/06/22 0500 05/07/22 0352  Weight: 115 kg 116.3 kg 112.5 kg    Intake/Output:   Intake/Output Summary (Last 24 hours) at 05/07/2022 0713 Last data filed at 05/07/2022 0700 Gross per 24 hour  Intake 4223.16 ml  Output 3607 ml  Net 616.16 ml      Physical Exam  CVP 3-4  General:  Intubated and sedated  HEENT: ETT/NGT Neck: supple. JVD not elevated. Carotids 2+ bilat; no bruits. No lymphadenopathy or thryomegaly appreciated. Cor: PMI nondisplaced. Regular rhythm and tachy. No rubs, gallops or murmurs. Lungs: intubated and clear  Abdomen: soft, nontender, nondistended. No hepatosplenomegaly. No bruits or masses. Good bowel sounds. Extremities: no cyanosis, clubbing, rash, edema. RUE PICC, 1 b/l LEE  Neuro: Intubated and sedation  Telemetry   Sinus tach low 100s   Labs    CBC Recent Labs    05/06/22 0415 05/07/22 0428  WBC 43.7* 47.3*  NEUTROABS 35.1* 35.8*  HGB 9.8* 8.6*  HCT  28.2* 25.5*  MCV 79.4* 81.7  PLT 53* 789*   Basic Metabolic Panel Recent Labs    05/06/22 0415 05/06/22 1600 05/07/22 0428  NA 136 137 137  K 4.1 4.0 4.1  CL 104 102 103  CO2 '23 26 24  '$ GLUCOSE 183* 222* 210*  BUN 35* 36* 39*  CREATININE 1.91* 1.99* 2.06*  CALCIUM 7.0* 7.0* 7.0*  MG 2.3  --  2.3  PHOS 3.3 3.4 4.3   Liver Function Tests Recent Labs    05/05/22 0614 05/05/22 1510 05/06/22 1600 05/07/22 0428  AST 331*  --   --   --   ALT 248*  --   --   --   ALKPHOS 167*  --   --   --   BILITOT 1.3*  --   --   --   PROT 4.6*  --   --   --   ALBUMIN <1.5*   < > 1.5* <1.5*   < > = values in this interval not displayed.   No results for input(s): "LIPASE", "AMYLASE" in the last 72 hours. Cardiac Enzymes No results for input(s): "CKTOTAL", "CKMB", "CKMBINDEX", "TROPONINI" in the last 72 hours.  Imaging   Pertinent imaging reviewed;  Echo 05/02/22: LVEF 20%, RV function moderately reduced.   Medications:     Scheduled Medications:  Chlorhexidine Gluconate Cloth  6 each Topical Daily   docusate  100 mg Per Tube BID  hydrocortisone sod succinate (SOLU-CORTEF) inj  100 mg Intravenous Q12H   insulin aspart  0-20 Units Subcutaneous Q4H   insulin aspart  7 Units Subcutaneous Q4H   mouth rinse  15 mL Mouth Rinse Q2H   pantoprazole (PROTONIX) IV  40 mg Intravenous Q24H   polyethylene glycol  17 g Per Tube Daily   sodium chloride flush  10-40 mL Intracatheter Q12H   sodium chloride flush  3 mL Intravenous Q12H    Infusions:   prismasol BGK 4/2.5 400 mL/hr at 05/07/22 0651    prismasol BGK 4/2.5 400 mL/hr at 05/06/22 1944   sodium chloride     sodium chloride Stopped (05/04/22 2003)   sodium chloride     sodium chloride 10 mL/hr at 05/07/22 0700   sodium chloride     DOBUTamine 5 mcg/kg/min (05/07/22 0700)   feeding supplement (VITAL 1.5 CAL) 20 mL/hr at 05/07/22 0700   fentaNYL infusion INTRAVENOUS 100 mcg/hr (05/07/22 0700)   meropenem (MERREM) IV Stopped  (05/07/22 0602)   midazolam 10 mg/hr (05/07/22 0700)   norepinephrine (LEVOPHED) Adult infusion 0 mcg/min (05/05/22 1853)   prismasol BGK 4/2.5 1,500 mL/hr at 05/07/22 0430   TPN ADULT (ION) 70 mL/hr at 05/07/22 0700   vancomycin Stopped (05/06/22 1120)   vasopressin 0 Units/min (05/05/22 1854)    PRN Medications: sodium chloride, sodium chloride, acetaminophen, alum & mag hydroxide-simeth, fentaNYL, fentaNYL (SUBLIMAZE) injection, fentaNYL (SUBLIMAZE) injection, heparin, HYDROmorphone (DILAUDID) injection, midazolam, ondansetron (ZOFRAN) IV, mouth rinse, sodium chloride, sodium chloride flush, sodium chloride flush    Patient Profile  51 y.o. AAF with history of bipolar disorder/schizophrenia, DM II, obesity, prior abdominal surgeries. Underwent robotic incisional hernia repair on 09/26.    Course c/b post-op ilieus/obstruction followed by septic shock 2/2 aspiration PNA, AKI on CRRT and cardiogenic shock.  Assessment/Plan  Cardiogenic & Septic shock -Septic shock 2/2 aspiration PNA. Procal > 150 -Taken emergently to cath lab 10/4 d/t concern for ST elevation MI. HS troponin > 24K. Coronaries clean. CI 1.87, RA mean 13, PCWP 19, LVEDP 16.  - WBC continuing to rise (now 47 K) although no increasing pressor requirement/fevers; possibly 2/2 steroids.   -Blood cultures 10/3 negative - On vancomycin and meropenum per CCM. - On DBA.   2. Acute systolic CHF -No prior cardiac history -Echo 10/03: EF 30-35%, RV moderately reduced -Bedside echo 10/04 at time of ECG changes: EF 15% -Remains on DBA 5  Co-ox 76%  -CVP 3-5   3. Acute MI (resolved) -ST elevation in inferolateral leads on ECG  -HS troponin > 24K at peak -Cath with normal coronaries   4. Metabolic acidosis: -Improved; continue CRRT   5. AKI: -Likely ATN in setting of septic shock -Remains anuric. Continue CRRT. Nephrology following  6. Acute hypoxic respiratory failure: -2/2 aspiration PNA -Vent and abx management  per CCM   7. Incisional hernia: -s/p robotic repair with mesh/lysis of adhesions this admit -GSU following   8. SBO -Post-op -Receiving TPN  9. Thrombocytopenia  - platelets 39>53>102 ; HIT panel negative. SRA pending    Length of Stay: [address] PA-C  05/07/2022, 7:13 AM  Advanced Heart Failure Team Pager [phone] (M-F; 7a - 5p)  Please contact [facility] for night-coverage after hours (5p -7a ) and weekends on [email or web address]

## 2022-05-07 NOTE — Progress Notes (Signed)
PT Cancellation Note  Patient Details Name: Patricia [name] MRN: [id number] DOB: [birth year]   Cancelled Treatment:    Reason Eval/Treat Not Completed: Patient not medically ready (pt remains intubated and sedated not yet appropriate for mobility)   [name] [name] 05/07/2022, 10:27 AM [place] Office: [phone]

## 2022-05-08 DIAGNOSIS — J9601 Acute respiratory failure with hypoxia: Secondary | ICD-10-CM | POA: Diagnosis not present

## 2022-05-08 DIAGNOSIS — J189 Pneumonia, unspecified organism: Secondary | ICD-10-CM | POA: Diagnosis not present

## 2022-05-08 DIAGNOSIS — R6521 Severe sepsis with septic shock: Secondary | ICD-10-CM | POA: Diagnosis not present

## 2022-05-08 DIAGNOSIS — Z9911 Dependence on respirator [ventilator] status: Secondary | ICD-10-CM | POA: Diagnosis not present

## 2022-05-08 DIAGNOSIS — A419 Sepsis, unspecified organism: Secondary | ICD-10-CM | POA: Diagnosis not present

## 2022-05-08 LAB — RENAL FUNCTION PANEL
Albumin: <1.5 g/dL — ABNORMAL LOW (ref 3.5–5.0)
Albumin: <1.5 g/dL — ABNORMAL LOW (ref 3.5–5.0)
Anion gap: 7 (ref 5–15)
Anion gap: 8 (ref 5–15)
BUN: 42 mg/dL — ABNORMAL HIGH (ref 6–20)
BUN: 44 mg/dL — ABNORMAL HIGH (ref 6–20)
CO2: 25 mmol/L (ref 22–32)
CO2: 23 mmol/L (ref 22–32)
Calcium: 7.1 mg/dL — ABNORMAL LOW (ref 8.9–10.3)
Calcium: 7.3 mg/dL — ABNORMAL LOW (ref 8.9–10.3)
Chloride: 105 mmol/L (ref 98–111)
Chloride: 107 mmol/L (ref 98–111)
Creatinine, Ser: 2.04 mg/dL — ABNORMAL HIGH (ref 0.44–1.00)
Creatinine, Ser: 1.9 mg/dL — ABNORMAL HIGH (ref 0.44–1.00)
GFR, Estimated: 29 mL/min — ABNORMAL LOW (ref >60)
GFR, Estimated: 31 mL/min — ABNORMAL LOW (ref >60)
Glucose, Bld: 208 mg/dL — ABNORMAL HIGH (ref 70–99)
Glucose, Bld: 211 mg/dL — ABNORMAL HIGH (ref 70–99)
Phosphorus: 3.8 mg/dL (ref 2.5–4.6)
Phosphorus: 4 mg/dL (ref 2.5–4.6)
Potassium: 3.6 mmol/L (ref 3.5–5.1)
Potassium: 4.2 mmol/L (ref 3.5–5.1)
Sodium: 137 mmol/L (ref 135–145)
Sodium: 138 mmol/L (ref 135–145)

## 2022-05-08 LAB — CBC
HCT: 23.4 % — ABNORMAL LOW (ref 36.0–46.0)
Hemoglobin: 7.9 g/dL — ABNORMAL LOW (ref 12.0–15.0)
MCH: 27.1 pg (ref 26.0–34.0)
MCHC: 33.8 g/dL (ref 30.0–36.0)
MCV: 80.4 fL (ref 80.0–100.0)
Platelets: 144 10*3/uL — ABNORMAL LOW (ref 150–400)
RBC: 2.91 MIL/uL — ABNORMAL LOW (ref 3.87–5.11)
RDW: 14.8 % (ref 11.5–15.5)
WBC: 33 10*3/uL — ABNORMAL HIGH (ref 4.0–10.5)
nRBC: 11.9 % — ABNORMAL HIGH (ref 0.0–0.2)

## 2022-05-08 LAB — GLUCOSE, CAPILLARY
Glucose-Capillary: 171 mg/dL — ABNORMAL HIGH (ref 70–99)
Glucose-Capillary: 184 mg/dL — ABNORMAL HIGH (ref 70–99)
Glucose-Capillary: 190 mg/dL — ABNORMAL HIGH (ref 70–99)
Glucose-Capillary: 195 mg/dL — ABNORMAL HIGH (ref 70–99)
Glucose-Capillary: 196 mg/dL — ABNORMAL HIGH (ref 70–99)
Glucose-Capillary: 200 mg/dL — ABNORMAL HIGH (ref 70–99)

## 2022-05-08 LAB — COOXEMETRY PANEL
Carboxyhemoglobin: 1.2 % (ref 0.5–1.5)
Carboxyhemoglobin: 2.2 % — ABNORMAL HIGH (ref 0.5–1.5)
Carboxyhemoglobin: <0.3 % — ABNORMAL LOW (ref 0.5–1.5)
Methemoglobin: <0.7 % (ref 0.0–1.5)
Methemoglobin: <0.7 % (ref 0.0–1.5)
Methemoglobin: <0.7 % (ref 0.0–1.5)
O2 Saturation: 71.2 %
O2 Saturation: 68.1 %
O2 Saturation: 60 %
Total hemoglobin: 8 g/dL — ABNORMAL LOW (ref 12.0–16.0)
Total hemoglobin: 9.1 g/dL — ABNORMAL LOW (ref 12.0–16.0)
Total hemoglobin: 7.1 g/dL — ABNORMAL LOW (ref 12.0–16.0)

## 2022-05-08 LAB — MAGNESIUM: Magnesium: 2.3 mg/dL (ref 1.7–2.4)

## 2022-05-08 MED ORDER — POTASSIUM CHLORIDE 10 MEQ/50ML IV SOLN
10.0000 meq | INTRAVENOUS | Status: AC
  Administered 2022-05-08 (×4): 10 meq via INTRAVENOUS
  Filled 2022-05-08: qty 50

## 2022-05-08 MED ORDER — TRACE MINERALS CU-MN-SE-ZN 300-55-60-3000 MCG/ML IV SOLN
INTRAVENOUS | Status: AC
  Filled 2022-05-08: qty 828.8

## 2022-05-08 MED ORDER — VITAL 1.5 CAL PO LIQD
1000.0000 mL | ORAL | Status: DC
  Administered 2022-05-08: 1000 mL

## 2022-05-08 MED ORDER — MIDAZOLAM HCL 2 MG/2ML IJ SOLN
0.5000 mg | Freq: Once | INTRAMUSCULAR | Status: AC
  Administered 2022-05-09: 0.5 mg via INTRAVENOUS
  Filled 2022-05-08: qty 2

## 2022-05-08 NOTE — Progress Notes (Signed)
Put pt back on pc 14 r20 5p 40% due to increase wob and RR and anxiety.

## 2022-05-08 NOTE — Progress Notes (Addendum)
Advanced Heart Failure Rounding Note  PCP-Cardiologist: None   Subjective:    - Remains intubated; sedation being weaned, however, minimally responsive.  - Decreasing WBC ct.  - CVP 6   Objective:   Weight Range: 112.7 kg Body mass index is 42.65 kg/m.   Vital Signs:   Temp:  [97.6 F (36.4 C)-97.9 F (36.6 C)] 97.7 F (36.5 C) (10/11 0812) Pulse Rate:  [98-114] 99 (10/11 0811) Resp:  [23-45] 25 (10/11 0811) BP: (115-129)/(59-68) 126/64 (10/11 0811) SpO2:  [95 %-100 %] 99 % (10/11 0812) Arterial Line BP: (111-136)/(62-82) 124/64 (10/11 0800) FiO2 (%):  [40 %] 40 % (10/11 0812) Weight:  [112.7 kg] 112.7 kg (10/11 0413) Last BM Date : 05/06/22  Weight change: Filed Weights   05/06/22 0500 05/07/22 0352 05/08/22 0413  Weight: 116.3 kg 112.5 kg 112.7 kg    Intake/Output:   Intake/Output Summary (Last 24 hours) at 05/08/2022 0903 Last data filed at 05/08/2022 0800 Gross per 24 hour  Intake 3568.67 ml  Output 3262 ml  Net 306.67 ml       Physical Exam  CVP 6 General:  Intubated and sedated  HEENT: ETT/NGT Neck: supple. JVD not elevated. Carotids 2+ bilat; no bruits. No lymphadenopathy or thryomegaly appreciated. Cor: PMI nondisplaced. Regular rhythm and tachy. No rubs, gallops or murmurs. Lungs: intubated and clear  Abdomen: soft, nontender, nondistended. No hepatosplenomegaly. No bruits or masses. Good bowel sounds. Extremities: warm to touch; diffuse anasarca Neuro: Intubated and sedation  Telemetry   Sinus tach low 100s   Labs    CBC Recent Labs    05/06/22 0415 05/07/22 0428 05/07/22 2146 05/08/22 0426  WBC 43.7* 47.3*  --  33.0*  NEUTROABS 35.1* 35.8*  --   --   HGB 9.8* 8.6* 8.8* 7.9*  HCT 28.2* 25.5* 26.0* 23.4*  MCV 79.4* 81.7  --  80.4  PLT 53* 102*  --  144*    Basic Metabolic Panel Recent Labs    05/07/22 0428 05/07/22 1548 05/07/22 2146 05/08/22 0426  NA 137 136 137 137  K 4.1 3.8 3.6 3.6  CL 103 102  --  105  CO2  24 24  --  25  GLUCOSE 210* 261*  --  208*  BUN 39* 40*  --  42*  CREATININE 2.06* 1.93*  --  2.04*  CALCIUM 7.0* 7.2*  --  7.1*  MG 2.3  --   --  2.3  PHOS 4.3 3.8  --  3.8    Liver Function Tests  Imaging   Pertinent imaging reviewed;  Echo 05/02/22: LVEF 20%, RV function moderately reduced.   Medications:     Scheduled Medications:  Chlorhexidine Gluconate Cloth  6 each Topical Daily   docusate  100 mg Per Tube BID   heparin injection (subcutaneous)  5,000 Units Subcutaneous Q8H   insulin aspart  0-20 Units Subcutaneous Q4H   insulin aspart  7 Units Subcutaneous Q4H   mouth rinse  15 mL Mouth Rinse Q2H   pantoprazole (PROTONIX) IV  40 mg Intravenous Q24H   polyethylene glycol  17 g Per Tube Daily   sodium chloride flush  10-40 mL Intracatheter Q12H   sodium chloride flush  3 mL Intravenous Q12H    Infusions:   prismasol BGK 4/2.5 400 mL/hr at 05/08/22 0756    prismasol BGK 4/2.5 400 mL/hr at 05/08/22 0758   sodium chloride     sodium chloride Stopped (05/04/22 2003)   sodium chloride  sodium chloride 10 mL/hr at 05/08/22 0800   sodium chloride     DOBUTamine 5 mcg/kg/min (05/08/22 0800)   feeding supplement (VITAL 1.5 CAL) 30 mL/hr at 05/08/22 0800   fentaNYL infusion INTRAVENOUS 100 mcg/hr (05/08/22 0800)   meropenem (MERREM) IV Stopped (05/08/22 0537)   midazolam 2.5 mg/hr (05/08/22 0800)   norepinephrine (LEVOPHED) Adult infusion 0 mcg/min (05/05/22 1853)   prismasol BGK 4/2.5 1,500 mL/hr at 05/08/22 0339   TPN ADULT (ION) 70 mL/hr at 05/08/22 0800   vancomycin Stopped (05/07/22 1108)   vasopressin 0 Units/min (05/05/22 1854)    PRN Medications: sodium chloride, sodium chloride, acetaminophen, alum & mag hydroxide-simeth, fentaNYL, fentaNYL (SUBLIMAZE) injection, fentaNYL (SUBLIMAZE) injection, heparin, HYDROmorphone (DILAUDID) injection, midazolam, ondansetron (ZOFRAN) IV, mouth rinse, sodium chloride, sodium chloride flush, sodium chloride  flush    Patient Profile  51 y.o. AAF with history of bipolar disorder/schizophrenia, DM II, obesity, prior abdominal surgeries. Underwent robotic incisional hernia repair on 09/26.    Course c/b post-op ilieus/obstruction followed by septic shock 2/2 aspiration PNA, AKI on CRRT and cardiogenic shock.  Assessment/Plan  Cardiogenic & Septic shock -Septic shock 2/2 aspiration PNA. Procal > 150 -Taken emergently to cath lab 10/4 d/t concern for ST elevation MI. HS troponin > 24K. Coronaries clean. CI 1.87, RA mean 13, PCWP 19, LVEDP 16.  - WBC continuing to rise (now 47 K) although no increasing pressor requirement/fevers; possibly 2/2 steroids.   -Blood cultures 10/3 negative - On vancomycin and meropenum per CCM. - decreasing dobutamine to 2.57mg/kg/min, will repeat CO-OX today. CVP at goal.   2. Acute systolic CHF -Nonischemic, no CAD on LHC.  -Will plan to wean dobutamine today and repeat CO-OX -Repeat TTE later this week to re-assess RV function.   3. Acute MI (resolved) -ST elevation in inferolateral leads on ECG  -HS troponin > 24K at peak -Cath with normal coronaries   4. Metabolic acidosis: -Improved; continue CRRT   5. AKI: -Likely ATN in setting of septic shock -Remains anuric. Continue CRRT. Nephrology following -Remove foley today.  6. Acute hypoxic respiratory failure: -2/2 aspiration PNA -Vent and abx management per CCM   7. Incisional hernia: -s/p robotic repair with mesh/lysis of adhesions this admit -GSU following   8. SBO -Post-op -Receiving TPN  9. Thrombocytopenia  - improving, HIT negative, SR pending  Length of Stay: [facility] 9:05 AM  CRITICAL CARE Performed by: AHebert Soho  Total critical care time: 33 minutes  Critical care time was exclusive of separately billable procedures and treating other patients.  Critical care was necessary to treat or prevent imminent or life-threatening  deterioration.  Critical care was time spent personally by me on the following activities: development of treatment plan with patient and/or surrogate as well as nursing, discussions with consultants, evaluation of patient's response to treatment, examination of patient, obtaining history from patient or surrogate, ordering and performing treatments and interventions, ordering and review of laboratory studies, ordering and review of radiographic studies, pulse oximetry and re-evaluation of patient's condition.

## 2022-05-08 NOTE — Progress Notes (Signed)
PHARMACY - TOTAL PARENTERAL NUTRITION CONSULT NOTE  Indication: Small bowel obstruction and ileus  Patient Measurements: Height: _0  (162.6 cm) Weight: 112.7 kg (248 lb 7.3 oz) IBW/kg (Calculated) : 54.7 TPN AdjBW (KG): 68.4 Body mass index is 42.65 kg/m.  Assessment:  51 yo female s/p difficult incisional hernia repair with long LoA on 9/26.  Patient developed a SBO based on CT scan 10/2.  Poor PO intake since surgery due to abdominal pain.  Pharmacy consulted to manage TPN.  Patient was extubated and then re-intubated on 10/8 due to respiratory distress. Initiated on TF on 10/9.  Glucose / Insulin: A1c 6.3% - CBGs 170-260s Methylprednisolone started 10/2 > Hydrocortisone 154m BID (d/c'd 10/10) Used 38 units rSSI (up), Novolog 7 units Q4H, 40 units in TPN Electrolytes: K 3.6, coCa 9.1, others WNL Renal: CRRT started 10/4, BUN 42 Hepatic: AST/ALT down 331/248, alk phos up to 167, tbili down to 1.3, TG elevated at 374 (off Propofol 10/4) Intake / Output; MIVF: anuric, NG 592m stool 15040m 2 unmeasured occurences, CRRT 3.2L, net +17.3L GI Imaging: 10/3 CT: continued SBO  10/2 CT: SBO 10/9 CT: infectious or inflammatory enteritis GI Surgeries / Procedures: none since TPN initiation   Central access: CVC triple lumen placed 05/04/22 TPN start date: 04/29/22  Nutritional Goals: RD Estimated Needs Total Energy Estimated Needs: 1800-2000 kcal/d Total Protein Estimated Needs: 110-130 grams Total Fluid Estimated Needs: 1.8-2L/d  Current Nutrition:  TPN TF (Vital 1.5) @ 40m31m (providing 49g protein, 1080 kcal per 24 hrs) (goal rate 50ml97m  Plan:  Continue concentrated TPN at 70 ml/hr to provide 124g AA and 1848 kCal, meeting 100% of needs.  Lipids at 20% of kCal (added back 10/6). Electrolytes in TPN: Na 125mEq53mK 0mEq/L47ma 6mEq/L,43m 0mEq/L, 36ms 22mmol/L,16mAc 1:2 Add standard MVI and trace elements to TPN, chromium removed for CRRT  Continue to replace K outside of  TPN - Give KCl IV 10mEq x4 C35mnue resistant SSI Q4H and increase regular insulin to 55 units in TPN Novolog 7 units Q4H per CCM Renal function panel BID and daily Mag while on CRRT per Renal Monitor BG with discontinuation of steroids and as TFs slowly advancing to goal Repeat TG on Thurs 10/12  Leon Montoya JardiLuisa HartCPS Clinical Pharmacist 05/08/2022 9:58 AM   Please refer to AMION for pharmacy phone number

## 2022-05-08 NOTE — Progress Notes (Signed)
Subjective/Chief Complaint: WBC down a little today.  Tolerating TF.  Having bowel function.   Objective: Vital signs in last 24 hours: Temp:  [97.6 F (36.4 C)-97.9 F (36.6 C)] 97.7 F (36.5 C) (10/11 0812) Pulse Rate:  [93-114] 97 (10/11 1200) Resp:  [23-45] 34 (10/11 1200) BP: (115-130)/(59-68) 130/67 (10/11 1154) SpO2:  [95 %-100 %] 95 % (10/11 1200) Arterial Line BP: (111-138)/(62-82) 138/79 (10/11 1200) FiO2 (%):  [40 %] 40 % (10/11 1154) Weight:  [112.7 kg] 112.7 kg (10/11 0413) Last BM Date : 05/08/22  Intake/Output from previous day: 10/10 0701 - 10/11 0700 In: 3684.3 [I.V.:2363.5; NG/GT:749.5; IV Piggyback:571.3] Out: 3404 [Urine:7; Stool:152] Intake/Output this shift: Total I/O In: 619.4 [I.V.:439.4; NG/GT:180] Out: 629 [Stool:35]  General: intubated, sedated, opens eyes CV: RRR HEENT: NG in place with feeds at 20 ml/hr; ETT Abdomen: soft, nondistended, incisions clean and dry with no erythema or induration.   Lab Results:  Recent Labs    05/07/22 0428 05/07/22 2146 05/08/22 0426  WBC 47.3*  --  33.0*  HGB 8.6* 8.8* 7.9*  HCT 25.5* 26.0* 23.4*  PLT 102*  --  144*    BMET Recent Labs    05/07/22 1548 05/07/22 2146 05/08/22 0426  NA 136 137 137  K 3.8 3.6 3.6  CL 102  --  105  CO2 24  --  25  GLUCOSE 261*  --  208*  BUN 40*  --  42*  CREATININE 1.93*  --  2.04*  CALCIUM 7.2*  --  7.1*    PT/INR No results for input(s): "LABPROT", "INR" in the last 72 hours. ABG Recent Labs    05/05/22 2108 05/07/22 2146  PHART 7.491* 7.506*  HCO3 24.2 23.7     Studies/Results: No results found.  Anti-infectives: Anti-infectives (From admission, onward)    Start     Dose/Rate Route Frequency Ordered Stop   05/06/22 1530  meropenem (MERREM) 1 g in sodium chloride 0.9 % 100 mL IVPB        1 g 200 mL/hr over 30 Minutes Intravenous Every 8 hours 05/06/22 1438     05/04/22 1000  vancomycin (VANCOCIN) IVPB 1000 mg/200 mL premix  Status:   Discontinued        1,000 mg 200 mL/hr over 60 Minutes Intravenous Every 24 hours 05/03/22 0802 05/08/22 0913   05/03/22 0845  vancomycin (VANCOREADY) IVPB 2000 mg/400 mL        2,000 mg 200 mL/hr over 120 Minutes Intravenous  Once 05/03/22 0751 05/03/22 1114   05/01/22 1800  piperacillin-tazobactam (ZOSYN) IVPB 3.375 g  Status:  Discontinued        3.375 g 12.5 mL/hr over 240 Minutes Intravenous Every 6 hours 05/01/22 1426 05/01/22 1427   05/01/22 1515  piperacillin-tazobactam (ZOSYN) IVPB 3.375 g  Status:  Discontinued        3.375 g 100 mL/hr over 30 Minutes Intravenous Every 6 hours 05/01/22 1428 05/06/22 1438   04/30/22 1400  piperacillin-tazobactam (ZOSYN) IVPB 2.25 g  Status:  Discontinued        2.25 g 100 mL/hr over 30 Minutes Intravenous Every 8 hours 04/30/22 1325 05/01/22 1426   04/29/22 1530  piperacillin-tazobactam (ZOSYN) IVPB 3.375 g  Status:  Discontinued        3.375 g 12.5 mL/hr over 240 Minutes Intravenous Every 8 hours 04/29/22 1433 04/30/22 1325   04/29/22 1145  metroNIDAZOLE (FLAGYL) IVPB 500 mg  Status:  Discontinued  500 mg 100 mL/hr over 60 Minutes Intravenous Every 12 hours 04/29/22 1047 04/29/22 1433   04/29/22 1145  vancomycin (VANCOCIN) IVPB 1000 mg/200 mL premix  Status:  Discontinued        1,000 mg 200 mL/hr over 60 Minutes Intravenous  Once 04/29/22 1047 04/29/22 1055   04/29/22 1145  vancomycin (VANCOREADY) IVPB 1500 mg/300 mL  Status:  Discontinued        1,500 mg 150 mL/hr over 120 Minutes Intravenous Every 24 hours 04/29/22 1055 04/29/22 1357   04/29/22 1045  ceFEPIme (MAXIPIME) 2 g in sodium chloride 0.9 % 100 mL IVPB  Status:  Discontinued        2 g 200 mL/hr over 30 Minutes Intravenous Every 8 hours 04/29/22 0954 04/29/22 1433   04/23/22 0730  ceFAZolin (ANCEF) IVPB 2g/100 mL premix        2 g 200 mL/hr over 30 Minutes Intravenous On call to O.R. 04/23/22 0726 04/23/22 8115       Assessment/Plan: Ms. Lepak is s/p robotic  incisional hernia repair on 04/23/22 -Postop ileus complicated by aspiration: Ileus resolved, having bowel function.  -CT abd/pelvis reviewed. Proximal small bowel is thickened but there is no pneumatosis or portal venous gas to suggest ischemia. I wonder if this is a post-ischemic thickening of the small intestine that will improve with time. - Okay for tube feeds as tolerated and decreasing TPN.   Cardiogenic/septic shock -Antibiotics broadened to meropenem and vanc given rising leukocytosis. -cath 10/4 - no obstructive CAD - Remains on dobutamine  AKI -CRRT  Resp failure -Vent management per CCM. Reasonable to proceed with tracheostomy if deemed necessary by critical care team.   Felicie Morn, MD General, Bariatric and Minimally Invasive Surgery [facility]    05/08/22 2:10 PM

## 2022-05-08 NOTE — Progress Notes (Signed)
NAME:  Patricia [name], MRN:  [id number], DOB:  12/15/1970, LOS: 48 ADMISSION DATE:  04/23/2022, CONSULTATION DATE:  04/29/2022 REFERRING MD:  Patricia Morn, MD, CHIEF COMPLAINT:   Small bowel obstruction, aspiration event, sepsis, respiratory failure    History of Present Illness:  Patricia [name] with above hx and nuc study in 2015 neg for ischemia done for chest pain and echo with EF 60-65% presented to hospital for excisional hernia repair (robotic hernia repair 04/23/22) with mesh, ileus post op on TNA. Developed vomiting and hypotension after NG came out on 04/29/22 and PNA on CXR, was tachycardic and BP improved with IV fluids. DX with sepsis and ABX started. At one point prolonged Qtc. Pt admitted to ICU on 10/2.  Was placed on levophed. Early AM on 10/3 was intubated for worsening metabolic/lactic acidosis. She had AKI as well. Crt was 6.01, her normal 0.9. Today Cr is 4 on CRRT.  Pertinent  Medical History  DM2 Depression OSA Migraines  Asthma Obesity Schizophrenia HTN HLD  Significant Hospital Events: Including procedures, antibiotic start and stop dates in addition to other pertinent events   9/26 Robotic hernia repair complicated by abd adhesions.  10/2 CT + for developing SBO and bilat lower lobe opacities most likely aspiration PNA. Progressive decompensation requiring transfer to ICU 10/2 PCCM consulted for respiratory failure & shock 10\3 Intubated for resp failure. CVL and arterial line placed. 04/30/22: EF 30-35%, RV moderately reduced, mild to moderate MR, IVC collapsed throughout respiratory cycle 10\4 EKG changes/trop increased--> and taken to cath lab - No obstructive CAD, but in decompensated heart failure, Bedside echo with EF 15%, new WMA, moderate RV dysfunction, trivial pericardial effusion.underwent emergent L/RHC. Normal coronaries, RA mean 13, PA mean 29, LVEDP 16, PCWP 19, CO/CI 3.96/1.87   returned to CICU post-cath. Right heart cath placed right fem.  Right IJ HD cath placed.  10/5 advanced HF team following, shock supported on norepi/vasopressin and Dobutamine. On CRRT.  10/6 weaning Norepi. Starting to wean fent. Needing restraints.  10/8: Reintubated due to neuromuscular weakness.  Bedside echo demonstrating improved RV function, LV function remained depressed 10/9 CT abdomen pelvis.suggesting wall thickening throughout the proximal and mid small bowel consistent with possible infectious or inflammatory enteritis there is also bibasilar airspace disease which could reflect pneumonia.  Antibiotic coverage widened.  Zosyn changed to meropenem.  Tolerating trickle feeds still on TPN 10/10 stopped stress dose steroids.  Resumed subcutaneous heparin.  Interim History / Subjective:  Remains sedated, not following commands or answering questions. Cardiology decreased dobutamine dose to 2.5 this morning.  Objective   Blood pressure 126/64, pulse 93, temperature 97.7 F (36.5 C), temperature source Axillary, resp. rate (!) 27, height '5\' 4"'$  (1.626 m), weight 112.7 kg, last menstrual period 07/07/2014, SpO2 100 %. CVP:  [3 mmHg-14 mmHg] 6 mmHg  Vent Mode: PCV FiO2 (%):  [40 %] 40 % Set Rate:  [20 bmp-24 bmp] 20 bmp Vt Set:  [430 mL] 430 mL PEEP:  [5 cmH20] 5 cmH20 Plateau Pressure:  [19 cmH20] 19 cmH20   Intake/Output Summary (Last 24 hours) at 05/08/2022 1148 Last data filed at 05/08/2022 1000 Gross per 24 hour  Intake 3447.33 ml  Output 3282 ml  Net 165.33 ml    Filed Weights   05/06/22 0500 05/07/22 0352 05/08/22 0413  Weight: 116.3 kg 112.5 kg 112.7 kg    Examination: General: critically ill appearing woman lying in bed in NAD, intubated, sedated on fentanyl and versed HEENT /AT, eyes  anicteric Pulmonary: breathing comfortably on PS 5, mild tachypnea, no significant ETT secretions Cardiac: S1S2, RRR Abdomen: obese, soft, NT. Incisions healing well. Extremities: mild dependent edema, no cyanosis Neuro: RASS 0, tracking, not  following commands.  GU minimal dark amber urine output  Coox 71% BUN 43 Cr 3.04 on CRRT WBC 33 H/H 7.9/23.4 Platelets 144 Resp culture: NGTD  Resolved problems:   Assessment & Plan:   Circulatory shock, improving. Mixed picture of septic shock and acute biventricular HFrEF (suspect septic or stress induced CM given clean coronaries). Sepsis due to pneumonias and enteritis. -daily coox -weaning DBA per cardiology -con't volume management with CRRT -con't broad antibiotics - vanc and meropenem; needs 1 week total  Acute hypoxic respiratory failure 2/2 aspiration pneumonia and compensation for metabolic acidosis. Previously failed extubation on 10/8, potentially due to weakness. Anticipate she may need tracheostomy.  RLL VAP It appears as though the etiology for respiratory failure was more generalized weakness but ongoing pneumonia likely contributed -con't LTVV -VAP prevention protocol -PAD protocol for sedation -Needs full SAT & SBT; anticipate she may need trach if she fails again. Would like to give her a second trial of extubation before proceeding with tracheostomy.  Oliguric AKI w/ AGMA 2/2 lactic acidosis  -CRRT per nephrology -strict I/O -renally dose meds, avoid nephrotoxic meds  -ok to remove foley and monitor bladder US  Acute metabolic encephalopathy superimposed on h/o bipolar disease and schizophrenia  -not on PTA bipolar meds  -lighten sedation as much as possible; hopeful for trial of extubation in the coming days. If she fails again would recommend tracheostomy  S/p hernia repair c/b ileus. CT with inflammatory thickening. -advance TF to 40cc/h -TPN; hopefully can wean off this soon -appreciate surgery's management  Worsening leukocytosis, initially we thought secondary to steroids, however CT imaging suggesting wall thickening throughout the proximal and mid small bowel consistent with possible infectious or inflammatory enteritis there is also bibasilar  airspace disease which could reflect pneumonia -con't meropenem, stopped vanc  Thrombocytopenia, likely sepsis related.  HIT assay negative, no LE DVTs. -con't heparin DVT prophylaxis  Acute anemia, due to critical illness & renal failure -monitor for signs of bleeding -transfuse for hb <7 or hemodynamically significant bleeding -monitor  Hyperglycemia w/ h/o type II DM -TPN with insulin -SSI PRN -TF coverage 7 units Q4h with hold parameters -goal BG 140-180   Severe protein energy malnutrition -TF & TPN   Best Practice (right click and "Reselect all SmartList Selections" daily)   Diet/type: TPN DVT prophylaxis: prophylactic heparin  GI prophylaxis: PPI Lines: Central line and Dialysis Catheter Foley:  Yes, and it is still needed Code Status:  full code   This patient is critically ill with multiple organ system failure which requires frequent high complexity decision making, assessment, support, evaluation, and titration of therapies. This was completed through the application of advanced monitoring technologies and extensive interpretation of multiple databases. During this encounter critical care time was devoted to patient care services described in this note for 36 minutes.  Julian Hy, DO 05/08/22 7:23 PM [facility]

## 2022-05-08 NOTE — Progress Notes (Signed)
Patient ID: DEVONY MCGRADY, female   DOB: 1970-09-02, 51 y.o.   MRN: [id number] S: No major events overnight.  Pt seen and examined while on CRRT.  Labs and orders reviewed and appropriate adjustments made.  CVP of 6, keep even.  O:BP 126/64   Pulse 93   Temp 97.7 F (36.5 C) (Axillary)   Resp (!) 27   Ht '5\' 4"'$  (1.626 m)   Wt 112.7 kg   LMP 07/07/2014 (Approximate)   SpO2 100%   BMI 42.65 kg/m   Intake/Output Summary (Last 24 hours) at 05/08/2022 1045 Last data filed at 05/08/2022 1000 Gross per 24 hour  Intake 3720.08 ml  Output 3412 ml  Net 308.08 ml   Intake/Output: I/O last 3 completed shifts: In: 5682.4 [I.V.:3721.4; NG/GT:1189.5; IV Piggyback:771.5] Out: 7322 [Urine:7; GURKY:706]  Intake/Output this shift:  Total I/O In: 386.3 [I.V.:266.3; NG/GT:120] Out: 393 [Stool:35] Weight change: 0.2 kg Gen: intubted and sedated CVS: RRR Resp: ventilated BS bilaterally Abd: +BS, soft, NT/nD Ext: no edema  Recent Labs  Lab 05/01/22 2325 05/02/22 0409 05/04/22 1905 05/05/22 2376 05/05/22 1510 05/05/22 2108 05/06/22 0415 05/06/22 1600 05/07/22 0428 05/07/22 1548 05/07/22 2146 05/08/22 0426  NA 133*   < > '135 138 136 136 136 137 137 136 137 137 '$  K 3.4*   < > 3.7 3.6 3.4* 3.5 4.1 4.0 4.1 3.8 3.6 3.6  CL 101   < > 103 106 104  --  104 102 103 102  --  105  CO2 15*   < > 22 23 21*  --  '23 26 24 24  '$ --  25  GLUCOSE 240*   < > 216* 189* 214*  --  183* 222* 210* 261*  --  208*  BUN 62*   < > 35* 34* 35*  --  35* 36* 39* 40*  --  42*  CREATININE 4.93*   < > 1.89* 1.97* 1.90*  --  1.91* 1.99* 2.06* 1.93*  --  2.04*  ALBUMIN 1.6*   < > 1.5* <1.5* <1.5*  --  <1.5* 1.5* <1.5* 1.5*  --  <1.5*  CALCIUM 5.5*   < > 6.8* 7.0* 7.0*  --  7.0* 7.0* 7.0* 7.2*  --  7.1*  PHOS  --    < > 2.7  --  2.4*  --  3.3 3.4 4.3 3.8  --  3.8  AST 700*  --   --  331*  --   --   --   --   --   --   --   --   ALT 439*  --   --  248*  --   --   --   --   --   --   --   --    < > = values in this  interval not displayed.   Liver Function Tests: Recent Labs  Lab 05/01/22 2325 05/02/22 0410 05/05/22 2831 05/05/22 1510 05/07/22 0428 05/07/22 1548 05/08/22 0426  AST 700*  --  331*  --   --   --   --   ALT 439*  --  248*  --   --   --   --   ALKPHOS 125  --  167*  --   --   --   --   BILITOT 1.7*  --  1.3*  --   --   --   --   PROT 4.5*  --  4.6*  --   --   --   --  ALBUMIN 1.6*   < > <1.5*   < > <1.5* 1.5* <1.5*   < > = values in this interval not displayed.   No results for input(s): "LIPASE", "AMYLASE" in the last 168 hours. No results for input(s): "AMMONIA" in the last 168 hours. CBC: Recent Labs  Lab 05/04/22 0417 05/04/22 1700 05/05/22 0426 05/05/22 2108 05/06/22 0415 05/07/22 0428 05/07/22 2146 05/08/22 0426  WBC 26.9*  --  34.9*  --  43.7* 47.3*  --  33.0*  NEUTROABS 22.6*  --  31.4*  --  35.1* 35.8*  --   --   HGB 8.9*   < > 9.4*   < > 9.8* 8.6* 8.8* 7.9*  HCT 26.7*   < > 27.8*   < > 28.2* 25.5* 26.0* 23.4*  MCV 82.4  --  83.5  --  79.4* 81.7  --  80.4  PLT 27*  --  39*  --  53* 102*  --  144*   < > = values in this interval not displayed.   Cardiac Enzymes: No results for input(s): "CKTOTAL", "CKMB", "CKMBINDEX", "TROPONINI" in the last 168 hours. CBG: Recent Labs  Lab 05/07/22 1545 05/07/22 2002 05/07/22 2336 05/08/22 0334 05/08/22 0821  GLUCAP 249* 190* 201* 190* 171*    Iron Studies: No results for input(s): "IRON", "TIBC", "TRANSFERRIN", "FERRITIN" in the last 72 hours. Studies/Results: CT ABDOMEN PELVIS WO CONTRAST  Result Date: 05/06/2022 CLINICAL DATA:  Sepsis, leukocytosis EXAM: CT ABDOMEN AND PELVIS WITHOUT CONTRAST TECHNIQUE: Multidetector CT imaging of the abdomen and pelvis was performed following the standard protocol without IV contrast. RADIATION DOSE REDUCTION: This exam was performed according to the departmental dose-optimization program which includes automated exposure control, adjustment of the mA and/or kV according to  patient size and/or use of iterative reconstruction technique. COMPARISON:  None Available. FINDINGS: Lower chest: Airspace opacities in both lower lobes with air bronchograms, right greater than left. This is similar to prior chest CT. No effusions. Hepatobiliary: No focal hepatic abnormality. Gallbladder unremarkable. Pancreas: No focal abnormality or ductal dilatation. Spleen: No focal abnormality.  Normal size. Adrenals/Urinary Tract: Foley catheter within the bladder which is decompressed. No renal or adrenal mass. No stones or hydronephrosis. Stomach/Bowel: NG tube within the stomach. Wall thickening within much of the proximal and mid small bowel compatible infectious or inflammatory enteritis. Distal small bowel decompressed and grossly unremarkable. Stomach and colon grossly unremarkable. No bowel obstruction. Vascular/Lymphatic: No evidence of aneurysm or adenopathy. Reproductive: Prior hysterectomy.  No adnexal masses. Other: Small amount of free fluid in the abdomen and pelvis. No free air. Musculoskeletal: No acute bony abnormality. IMPRESSION: Wall thickening throughout much of the proximal and mid small bowel compatible with infectious or inflammatory enteritis. Small amount of free fluid in the abdomen and pelvis. Bibasilar airspace opacities are similar to prior chest CT and could reflect atelectasis or pneumonia. NG tube in the stomach. Electronically Signed   By: Rolm Baptise M.D.   On: 05/06/2022 13:44   VAS [place] LOWER EXTREMITY VENOUS (DVT)  Result Date: 05/06/2022  Lower Venous DVT Study Patient Name:  WINDA SUMMERALL [facility]  Date of Exam:   05/06/2022 Medical Rec #: [id number]           Accession #:    [id number] Date of Birth: September 22, 1970           Patient Gender: F Patient Age:   68 years Exam Location:  [facility] Procedure:      VAS [place] LOWER EXTREMITY  VENOUS (DVT) Referring Phys: Kipp Brood --------------------------------------------------------------------------------  Indications:  Thrombocytopenia.  Limitations: Body habitus and poor ultrasound/tissue interface. Comparison Study: 04/29/22 negative lower extremity venous duplex Performing Technologist: Maudry Mayhew MHA, RDMS, RVT, RDCS  Examination Guidelines: A complete evaluation includes B-mode imaging, spectral Doppler, color Doppler, and power Doppler as needed of all accessible portions of each vessel. Bilateral testing is considered an integral part of a complete examination. Limited examinations for reoccurring indications may be performed as noted. The reflux portion of the exam is performed with the patient in reverse Trendelenburg.  +---------+---------------+---------+-----------+----------+--------------+ RIGHT    CompressibilityPhasicitySpontaneityPropertiesThrombus Aging +---------+---------------+---------+-----------+----------+--------------+ CFV      Full           Yes      Yes                                 +---------+---------------+---------+-----------+----------+--------------+ SFJ      Full                                                        +---------+---------------+---------+-----------+----------+--------------+ FV Prox  Full                                                        +---------+---------------+---------+-----------+----------+--------------+ FV Mid   Full                                                        +---------+---------------+---------+-----------+----------+--------------+ FV DistalFull                                                        +---------+---------------+---------+-----------+----------+--------------+ PFV      Full                                                        +---------+---------------+---------+-----------+----------+--------------+ POP      Full           Yes      Yes                                 +---------+---------------+---------+-----------+----------+--------------+ PTV      Full                                                         +---------+---------------+---------+-----------+----------+--------------+ PERO     Full                                                        +---------+---------------+---------+-----------+----------+--------------+   +---------+---------------+---------+-----------+----------+--------------+  LEFT     CompressibilityPhasicitySpontaneityPropertiesThrombus Aging +---------+---------------+---------+-----------+----------+--------------+ CFV      Full           Yes      Yes                                 +---------+---------------+---------+-----------+----------+--------------+ SFJ      Full                                                        +---------+---------------+---------+-----------+----------+--------------+ FV Prox  Full                                                        +---------+---------------+---------+-----------+----------+--------------+ FV Mid   Full                                                        +---------+---------------+---------+-----------+----------+--------------+ FV DistalFull                                                        +---------+---------------+---------+-----------+----------+--------------+ PFV      Full                                                        +---------+---------------+---------+-----------+----------+--------------+ POP      Full           Yes      Yes                                 +---------+---------------+---------+-----------+----------+--------------+ PTV      Full                                                        +---------+---------------+---------+-----------+----------+--------------+ PERO     Full                                                        +---------+---------------+---------+-----------+----------+--------------+     Summary: RIGHT: - There is no evidence of deep vein thrombosis in the  lower extremity.  - No cystic structure found in the popliteal fossa.  LEFT: - There is no evidence of deep vein thrombosis in the lower extremity.  - No  cystic structure found in the popliteal fossa.  *See table(s) above for measurements and observations. Electronically signed by Monica Martinez MD on 05/06/2022 at 11:54:44 AM.    Final     Chlorhexidine Gluconate Cloth  6 each Topical Daily   docusate  100 mg Per Tube BID   heparin injection (subcutaneous)  5,000 Units Subcutaneous Q8H   insulin aspart  0-20 Units Subcutaneous Q4H   insulin aspart  7 Units Subcutaneous Q4H   mouth rinse  15 mL Mouth Rinse Q2H   pantoprazole (PROTONIX) IV  40 mg Intravenous Q24H   polyethylene glycol  17 g Per Tube Daily   sodium chloride flush  10-40 mL Intracatheter Q12H   sodium chloride flush  3 mL Intravenous Q12H    BMET    Component Value Date/Time   NA 137 05/08/2022 0426   NA 139 07/02/2014 1923   K 3.6 05/08/2022 0426   K 3.9 07/02/2014 1923   CL 105 05/08/2022 0426   CL 105 07/02/2014 1923   CO2 25 05/08/2022 0426   CO2 26 07/02/2014 1923   GLUCOSE 208 (H) 05/08/2022 0426   GLUCOSE 95 07/02/2014 1923   BUN 42 (H) 05/08/2022 0426   BUN 7 07/02/2014 1923   CREATININE 2.04 (H) 05/08/2022 0426   CREATININE 0.98 07/02/2014 1923   CALCIUM 7.1 (L) 05/08/2022 0426   CALCIUM 8.5 07/02/2014 1923   GFRNONAA 29 (L) 05/08/2022 0426   GFRNONAA >60 07/02/2014 1923   GFRAA >60 04/22/2018 2215   GFRAA >60 07/02/2014 1923   CBC    Component Value Date/Time   WBC 33.0 (H) 05/08/2022 0426   RBC 2.91 (L) 05/08/2022 0426   HGB 7.9 (L) 05/08/2022 0426   HGB 9.4 (L) 07/02/2014 1923   HCT 23.4 (L) 05/08/2022 0426   HCT 30.3 (L) 07/02/2014 1923   PLT 144 (L) 05/08/2022 0426   PLT 449 (H) 07/02/2014 1923   MCV 80.4 05/08/2022 0426   MCV 71 (L) 07/02/2014 1923   MCH 27.1 05/08/2022 0426   MCHC 33.8 05/08/2022 0426   RDW 14.8 05/08/2022 0426   RDW 18.8 (H) 07/02/2014 1923   LYMPHSABS 2.0  05/07/2022 0428   MONOABS 2.4 (H) 05/07/2022 0428   EOSABS 0.0 05/07/2022 0428   BASOSABS 0.1 05/07/2022 0428      Assessment/Plan:   AKI - likely ATN in the setting of cardiogenic and septic shock.  Started on CRRT 05/01/22 after RIJ temp HD catheter placed. All fluids 4K/2.5Ca, pre and post-filter rates at 400 mL/hr, dialysate rate 1500 mL/hr, no anticoagulation due to thrombocytopenia.  UF goal for CVP of 7-9 mmHg.  To keep even again today due to low CVP Remains anuric, continue with CRRT as ordered. Cardiogenic/septic shock - weaned off levophed, on dobutamine.  Heart failure team following. Likely due to aspiration pneumonia. Acute hypoxic respiratory failure - was re-intubated 05/05/22.  Vent per PCCM and possible trach. Infectious enteritis - seen on CT scan.  WBC continues to climb.  Plan per PCCM. Anemia of critical illness - transfuse for Hgb <7. Acute MI - normal coronaries by cath [facility] - improved with CRRT. Thrombocytopenia - improving.  S/p robotic incisional hernia repair with mesh and LOA - Surgery following.   Donetta Potts, MD [facility]

## 2022-05-08 NOTE — Progress Notes (Signed)
eLink Physician-Brief Progress Note Patient Name: Patricia [name] DOB: [birth year] MRN: [id number]   Date of Service  05/08/2022  HPI/Events of Note  Anxiety   eICU Interventions  Plan: Versed 0.5 mg IV X 1.      Intervention Category Major Interventions: Delirium, psychosis, severe agitation - evaluation and management  Juvenal Umar Eugene 05/08/2022, 11:03 PM

## 2022-05-08 NOTE — Progress Notes (Signed)
PT Cancellation Note  Patient Details Name: Patricia [name] MRN: [id number] DOB: [birth year]   Cancelled Treatment:    Reason Eval/Treat Not Completed: Medical issues which prohibited therapy Noted in cardiology note pt sedated, vented, and minimally responsive.  RN confirmed.  Pt not able to participate with PT today. Will f/u as able.  Abran Richard, PT Acute Rehab [facility] [phone]   Karlton Lemon 05/08/2022, 11:35 AM

## 2022-05-09 DIAGNOSIS — J9601 Acute respiratory failure with hypoxia: Secondary | ICD-10-CM

## 2022-05-09 DIAGNOSIS — Z9911 Dependence on respirator [ventilator] status: Secondary | ICD-10-CM | POA: Diagnosis not present

## 2022-05-09 DIAGNOSIS — J189 Pneumonia, unspecified organism: Secondary | ICD-10-CM | POA: Diagnosis not present

## 2022-05-09 DIAGNOSIS — A419 Sepsis, unspecified organism: Secondary | ICD-10-CM | POA: Diagnosis not present

## 2022-05-09 DIAGNOSIS — R6521 Severe sepsis with septic shock: Secondary | ICD-10-CM | POA: Diagnosis not present

## 2022-05-09 LAB — CULTURE, RESPIRATORY W GRAM STAIN: Culture: NO GROWTH

## 2022-05-09 LAB — RENAL FUNCTION PANEL
Albumin: <1.5 g/dL — ABNORMAL LOW (ref 3.5–5.0)
Albumin: <1.5 g/dL — ABNORMAL LOW (ref 3.5–5.0)
Anion gap: 14 (ref 5–15)
Anion gap: 9 (ref 5–15)
BUN: 43 mg/dL — ABNORMAL HIGH (ref 6–20)
BUN: 47 mg/dL — ABNORMAL HIGH (ref 6–20)
CO2: 23 mmol/L (ref 22–32)
CO2: 23 mmol/L (ref 22–32)
Calcium: 7.6 mg/dL — ABNORMAL LOW (ref 8.9–10.3)
Calcium: 7.1 mg/dL — ABNORMAL LOW (ref 8.9–10.3)
Chloride: 103 mmol/L (ref 98–111)
Chloride: 106 mmol/L (ref 98–111)
Creatinine, Ser: 1.96 mg/dL — ABNORMAL HIGH (ref 0.44–1.00)
Creatinine, Ser: 2.15 mg/dL — ABNORMAL HIGH (ref 0.44–1.00)
GFR, Estimated: 30 mL/min — ABNORMAL LOW (ref >60)
GFR, Estimated: 27 mL/min — ABNORMAL LOW (ref >60)
Glucose, Bld: 196 mg/dL — ABNORMAL HIGH (ref 70–99)
Glucose, Bld: 139 mg/dL — ABNORMAL HIGH (ref 70–99)
Phosphorus: 4.3 mg/dL (ref 2.5–4.6)
Phosphorus: 4 mg/dL (ref 2.5–4.6)
Potassium: 4.2 mmol/L (ref 3.5–5.1)
Potassium: 4.3 mmol/L (ref 3.5–5.1)
Sodium: 140 mmol/L (ref 135–145)
Sodium: 138 mmol/L (ref 135–145)

## 2022-05-09 LAB — COOXEMETRY PANEL
Carboxyhemoglobin: 1.7 % — ABNORMAL HIGH (ref 0.5–1.5)
Methemoglobin: <0.7 % (ref 0.0–1.5)
O2 Saturation: 72 %
Total hemoglobin: 7.9 g/dL — ABNORMAL LOW (ref 12.0–16.0)

## 2022-05-09 LAB — GLUCOSE, CAPILLARY
Glucose-Capillary: 183 mg/dL — ABNORMAL HIGH (ref 70–99)
Glucose-Capillary: 175 mg/dL — ABNORMAL HIGH (ref 70–99)
Glucose-Capillary: 197 mg/dL — ABNORMAL HIGH (ref 70–99)
Glucose-Capillary: 127 mg/dL — ABNORMAL HIGH (ref 70–99)
Glucose-Capillary: 129 mg/dL — ABNORMAL HIGH (ref 70–99)
Glucose-Capillary: 125 mg/dL — ABNORMAL HIGH (ref 70–99)

## 2022-05-09 LAB — CBC
HCT: 23.8 % — ABNORMAL LOW (ref 36.0–46.0)
Hemoglobin: 7.8 g/dL — ABNORMAL LOW (ref 12.0–15.0)
MCH: 27.2 pg (ref 26.0–34.0)
MCHC: 32.8 g/dL (ref 30.0–36.0)
MCV: 82.9 fL (ref 80.0–100.0)
Platelets: 153 10*3/uL (ref 150–400)
RBC: 2.87 MIL/uL — ABNORMAL LOW (ref 3.87–5.11)
RDW: 14.8 % (ref 11.5–15.5)
WBC: 27.9 10*3/uL — ABNORMAL HIGH (ref 4.0–10.5)
nRBC: 7.1 % — ABNORMAL HIGH (ref 0.0–0.2)

## 2022-05-09 LAB — HEPATIC FUNCTION PANEL
ALT: 324 U/L — ABNORMAL HIGH (ref 0–44)
AST: 385 U/L — ABNORMAL HIGH (ref 15–41)
Albumin: <1.5 g/dL — ABNORMAL LOW (ref 3.5–5.0)
Alkaline Phosphatase: 216 U/L — ABNORMAL HIGH (ref 38–126)
Bilirubin, Direct: 0.7 mg/dL — ABNORMAL HIGH (ref 0.0–0.2)
Indirect Bilirubin: 0.9 mg/dL (ref 0.3–0.9)
Total Bilirubin: 1.6 mg/dL — ABNORMAL HIGH (ref 0.3–1.2)
Total Protein: 5.2 g/dL — ABNORMAL LOW (ref 6.5–8.1)

## 2022-05-09 LAB — MAGNESIUM: Magnesium: 2.5 mg/dL — ABNORMAL HIGH (ref 1.7–2.4)

## 2022-05-09 LAB — TRIGLYCERIDES: Triglycerides: 180 mg/dL — ABNORMAL HIGH (ref <150)

## 2022-05-09 MED ORDER — INSULIN ASPART 100 UNIT/ML IJ SOLN
6.0000 [IU] | Freq: Once | INTRAMUSCULAR | Status: AC
  Administered 2022-05-09: 6 [IU] via SUBCUTANEOUS

## 2022-05-09 MED ORDER — FUROSEMIDE 10 MG/ML IJ SOLN
120.0000 mg | Freq: Two times a day (BID) | INTRAVENOUS | Status: DC
  Filled 2022-05-09 (×2): qty 12

## 2022-05-09 MED ORDER — INSULIN ASPART 100 UNIT/ML IJ SOLN
7.0000 [IU] | INTRAMUSCULAR | Status: DC
  Administered 2022-05-09 – 2022-05-10 (×4): 7 [IU] via SUBCUTANEOUS

## 2022-05-09 MED ORDER — PROSOURCE TF20 ENFIT COMPATIBL EN LIQD
60.0000 mL | Freq: Two times a day (BID) | ENTERAL | Status: DC
  Administered 2022-05-09 – 2022-05-24 (×31): 60 mL
  Filled 2022-05-09 (×31): qty 60

## 2022-05-09 MED ORDER — DEXMEDETOMIDINE HCL IN NACL 400 MCG/100ML IV SOLN
0.4000 ug/kg/h | INTRAVENOUS | Status: DC
  Administered 2022-05-09: 0.8 ug/kg/h via INTRAVENOUS
  Administered 2022-05-09: 0.7 ug/kg/h via INTRAVENOUS
  Administered 2022-05-09: 0.4 ug/kg/h via INTRAVENOUS
  Administered 2022-05-10: 1.2 ug/kg/h via INTRAVENOUS
  Administered 2022-05-10: 0.4 ug/kg/h via INTRAVENOUS
  Administered 2022-05-10 – 2022-05-11 (×6): 1.2 ug/kg/h via INTRAVENOUS
  Administered 2022-05-11: 1 ug/kg/h via INTRAVENOUS
  Administered 2022-05-11 – 2022-05-13 (×9): 1.2 ug/kg/h via INTRAVENOUS
  Administered 2022-05-13: 0.4 ug/kg/h via INTRAVENOUS
  Administered 2022-05-13: 1.2 ug/kg/h via INTRAVENOUS
  Administered 2022-05-14 (×2): 0.7 ug/kg/h via INTRAVENOUS
  Administered 2022-05-14: 0.2 ug/kg/h via INTRAVENOUS
  Filled 2022-05-09: qty 100
  Filled 2022-05-09: qty 200
  Filled 2022-05-09 (×3): qty 100
  Filled 2022-05-09 (×2): qty 200
  Filled 2022-05-09 (×2): qty 100
  Filled 2022-05-09: qty 200
  Filled 2022-05-09 (×3): qty 100
  Filled 2022-05-09 (×3): qty 200
  Filled 2022-05-09 (×4): qty 100
  Filled 2022-05-09 (×2): qty 200
  Filled 2022-05-09 (×2): qty 100

## 2022-05-09 MED ORDER — VITAL 1.5 CAL PO LIQD
1000.0000 mL | ORAL | Status: DC
  Administered 2022-05-09: 1000 mL

## 2022-05-09 MED ORDER — TRACE MINERALS CU-MN-SE-ZN 300-55-60-3000 MCG/ML IV SOLN
INTRAVENOUS | Status: AC
  Filled 2022-05-09: qty 828.8

## 2022-05-09 MED ORDER — INSULIN ASPART 100 UNIT/ML IJ SOLN
3.0000 [IU] | Freq: Once | INTRAMUSCULAR | Status: AC
  Administered 2022-05-09: 3 [IU] via SUBCUTANEOUS

## 2022-05-09 MED ORDER — RENA-VITE PO TABS
1.0000 | ORAL_TABLET | Freq: Every day | ORAL | Status: DC
  Administered 2022-05-09 – 2022-05-27 (×19): 1
  Filled 2022-05-09 (×21): qty 1

## 2022-05-09 NOTE — Progress Notes (Signed)
Nutrition Follow-up  DOCUMENTATION CODES:   Not applicable  INTERVENTION:   Plan for Cortrak placement tomorrow; discussed with Surgery and PCCM  Tube Feeding via OG:  Increased to goal of Vital 1.5 at 50 Add Pro-Source TF20 60 mL BID Tube feeding regimen at goal rate provides 1960 kcal, 121 grams of protein, and 917 ml of H2O.  Add Renal MVI daily as pt with increased needs on CRRT   Ok to titrate off TPN if pt tolerating TF at goal rate in 24 hours  NUTRITION DIAGNOSIS:   Inadequate oral intake related to inability to eat as evidenced by NPO status (SBO).  Being addressed via TF  GOAL:   Patient will meet greater than or equal to 90% of their needs  Progressing  MONITOR:   Vent status, Labs, Weight trends, TF tolerance, Skin, I & O's, Other (Comment) (TPN)  REASON FOR ASSESSMENT:   Consult Enteral/tube feeding initiation and management (trickle tube feeds)  ASSESSMENT:   Pt with hx of bipolar disorder, HTN, HLD, GERD, chronic bronchitis, GERD, DM type 2, and multiple prior abdominal surgeries presented to hospital for planned repair of a spigelian hernia.  09/26 - s/p robotic incisional hernia repair with mesh, extensive LOA 10/02 - transferred to ICU, pressors started, NG tube placed for decompression, TPN start 10/03 - intubated, NG tube exchanged for OG tube 10/04 - CRRT start, OG tube removed and replaced, concern for STEMI s/p L heart cath and coronary angiography without occlusion 10/08 - extubated, later re-intubated 10/09 - trickle TF initiated  Pt remains on CRRT, remains on vent support, no pressors  Tolerating Vital 1.5 at 40 ml/hr; TPN at goal rate of 70 ml/hr. Ok to continue TF as tolerated to goal and when TPN per surgery  Weight down to 109.7 kg. CRRT with 3.7 L net UF in 24 hours. No UOP  Labs: CBGs 175-200, Creatinine 1.96, BUN 43, albumin <1.5, TG 180 Meds: ss novolog, lasix, novolog q 4 hours, colace, miralax   Diet Order:   Diet  Order             Diet NPO time specified  Diet effective now                   EDUCATION NEEDS:   No education needs have been identified at this time  Skin:  Skin Assessment: Reviewed RN Assessment (surgical incisions - abdomen)  Last BM:  10/12 rectal tube  Height:   Ht Readings from Last 1 Encounters:  04/30/22 '5\' 4"'$  (1.626 m)    Weight:   Wt Readings from Last 1 Encounters:  05/09/22 109.7 kg    Ideal Body Weight:  54.5 kg  BMI:  Body mass index is 41.51 kg/m.  Estimated Nutritional Needs:   Kcal:  1800-2000 kcal/d  Protein:  110-130 grams  Fluid:  1.8-2L/d   Kerman Passey MS, RDN, LDN, CNSC Registered Dietitian 3 Clinical Nutrition RD Pager and On-Call Pager Number Located in [place]

## 2022-05-09 NOTE — Progress Notes (Signed)
Advanced Heart Failure Rounding Note  PCP-Cardiologist: None   Subjective:    - Remains intubated; waking up and appears to be following some commands but very weak at this time.  - Dobutamine discontinued.  - Urine output remains dismal.  Objective:   Weight Range: 109.7 kg Body mass index is 41.51 kg/m.   Vital Signs:   Temp:  [97.7 F (36.5 C)-98.8 F (37.1 C)] 98.8 F (37.1 C) (10/12 0336) Pulse Rate:  [83-100] 95 (10/12 0700) Resp:  [23-40] 28 (10/12 0700) BP: (126-132)/(64-75) 132/75 (10/11 1530) SpO2:  [95 %-100 %] 100 % (10/12 0700) Arterial Line BP: (102-153)/(54-81) 123/65 (10/12 0700) FiO2 (%):  [40 %] 40 % (10/12 0347) Weight:  [109.7 kg] 109.7 kg (10/12 0202) Last BM Date : 05/08/22  Weight change: Filed Weights   05/07/22 0352 05/08/22 0413 05/09/22 0202  Weight: 112.5 kg 112.7 kg 109.7 kg    Intake/Output:   Intake/Output Summary (Last 24 hours) at 05/09/2022 0751 Last data filed at 05/09/2022 0700 Gross per 24 hour  Intake 3420.72 ml  Output 3752 ml  Net -331.28 ml     CVP 7 Physical Exam  General:  intubated; HEENT: ETT Neck: supple. no JVD. Carotids 2+ bilat; no bruits. No lymphadenopathy or thryomegaly appreciated. RIJ HD  Cor: PMI nondisplaced. Regular rate & rhythm. No rubs, gallops or murmurs. Lungs: coarse b/l Abdomen: soft, nontender, nondistended. No hepatosplenomegaly. No bruits or masses. Good bowel sounds. Extremities: no cyanosis, clubbing, rash, edema Neuro: Intubated.  Awake. Follows commands. MAE, weak  Telemetry   SR-ST 80-100s   Labs    CBC Recent Labs    05/07/22 0428 05/07/22 2146 05/08/22 0426 05/09/22 0422  WBC 47.3*  --  33.0* 27.9*  NEUTROABS 35.8*  --   --   --   HGB 8.6*   < > 7.9* 7.8*  HCT 25.5*   < > 23.4* 23.8*  MCV 81.7  --  80.4 82.9  PLT 102*  --  144* 153   < > = values in this interval not displayed.   Basic Metabolic Panel Recent Labs    05/08/22 0426 05/08/22 1846  05/09/22 0422  NA 137 138 140  K 3.6 4.2 4.2  CL 105 107 103  CO2 '25 23 23  '$ GLUCOSE 208* 211* 196*  BUN 42* 44* 43*  CREATININE 2.04* 1.90* 1.96*  CALCIUM 7.1* 7.3* 7.6*  MG 2.3  --  2.5*  PHOS 3.8 4.0 4.3   Liver Function Tests  Imaging   Pertinent imaging reviewed;  Echo 05/02/22: LVEF 20%, RV function moderately reduced.   Medications:     Scheduled Medications:  Chlorhexidine Gluconate Cloth  6 each Topical Daily   docusate  100 mg Per Tube BID   heparin injection (subcutaneous)  5,000 Units Subcutaneous Q8H   insulin aspart  0-20 Units Subcutaneous Q4H   insulin aspart  7 Units Subcutaneous Q4H   mouth rinse  15 mL Mouth Rinse Q2H   pantoprazole (PROTONIX) IV  40 mg Intravenous Q24H   polyethylene glycol  17 g Per Tube Daily   sodium chloride flush  10-40 mL Intracatheter Q12H   sodium chloride flush  3 mL Intravenous Q12H    Infusions:   prismasol BGK 4/2.5 400 mL/hr at 05/08/22 2047    prismasol BGK 4/2.5 400 mL/hr at 05/08/22 2044   sodium chloride     sodium chloride Stopped (05/04/22 2003)   sodium chloride     sodium chloride 10  mL/hr at 05/09/22 0700   sodium chloride     feeding supplement (VITAL 1.5 CAL) 40 mL/hr at 05/09/22 0700   fentaNYL infusion INTRAVENOUS 150 mcg/hr (05/09/22 0700)   meropenem (MERREM) IV Stopped (05/09/22 0602)   norepinephrine (LEVOPHED) Adult infusion 0 mcg/min (05/05/22 1853)   prismasol BGK 4/2.5 1,500 mL/hr at 05/09/22 0440   TPN ADULT (ION) 70 mL/hr at 05/09/22 0700    PRN Medications: sodium chloride, sodium chloride, acetaminophen, alum & mag hydroxide-simeth, fentaNYL, fentaNYL (SUBLIMAZE) injection, fentaNYL (SUBLIMAZE) injection, heparin, HYDROmorphone (DILAUDID) injection, ondansetron (ZOFRAN) IV, mouth rinse, sodium chloride, sodium chloride flush, sodium chloride flush    Patient Profile  51 y.o. AAF with history of bipolar disorder/schizophrenia, DM II, obesity, prior abdominal surgeries. Underwent robotic  incisional hernia repair on 09/26.    Course c/b post-op ilieus/obstruction followed by septic shock 2/2 aspiration PNA, AKI on CRRT and cardiogenic shock.  Assessment/Plan  Cardiogenic & Septic shock -Septic shock 2/2 aspiration PNA. Procal > 150 -Taken emergently to cath lab 10/4 d/t concern for ST elevation MI. HS troponin > 24K. Coronaries clean. CI 1.87, RA mean 13, PCWP 19, LVEDP 16.  - WBC trending down -->28K: possibly 2/2 steroids.   -Blood cultures 10/3 negative - On vancomycin and meropenum per CCM. -CO-OX stable. 72% off DBA  2. Acute systolic CHF -Nonischemic, no CAD on LHC. -CO-OX 72%. Off DBA.    -Repeat TTE tomorrow to re-assess RV function.   3. Acute MI (resolved) -ST elevation in inferolateral leads on ECG  -HS troponin > 24K at peak -Cath with normal coronaries   4. Metabolic acidosis: -Improved; continue CRRT   5. AKI: -Likely ATN in setting of septic shock -Remains anuric  Continue CRRT. Nephrology following  6. Acute hypoxic respiratory failure: -2/2 aspiration PNA -Vent and abx management per CCM   7. Incisional hernia: -s/p robotic repair with mesh/lysis of adhesions this admit -GSU following   8. SBO -Post-op -Receiving TPN - Loose stools; stopping stool softners.   9. Thrombocytopenia  - improving, HIT negative, SRA negative.   Length of Stay: 15 Advanced Heart Failure Team Pager [phone] (M-F; 7a - 5p)  Please contact [facility] for night-coverage after hours (4p -7a ) and weekends on [email or web address]   Amy Clegg 7:51 AM  Patient seen with NP, agree with the above note.   Subjective: Intubated/sedated   Exam: General: AAF intubated/sedated.  HEENT: Normal.  Neck: Thick neck. IJ HD catheter in place Lungs: coarse lung sounds CV: Nondisplaced PMI.  Heart regular S1/S2, no S3/S4, no murmur.  diffuse anasarca  Abdomen: Soft, nontender, no hepatosplenomegaly, no distention.  Skin: Intact without lesions or rashes.  Neurologic:  intubated/sedated Psych: intubated/sedated Extremities: No clubbing or cyanosis.    A/P - Off dobutamine now with stable mixed venous, in addition, has not required any pressor support for>72h. Plan to repeat TTE to re-assess RV/LV function.  - WBC ct improving; continue merrem. Improving mentation.  - Plan to stop CRRT once filter clots. Will target CVP ~10 and follow with lasix challenge.   Kalei Meda Advanced Heart Failure 1:56 PM  CRITICAL CARE Performed by: Hebert Soho   Total critical care time: 34 minutes  Critical care time was exclusive of separately billable procedures and treating other patients.  Critical care was necessary to treat or prevent imminent or life-threatening deterioration.  Critical care was time spent personally by me on the following activities: development of treatment plan with patient and/or surrogate as well as nursing, discussions  with consultants, evaluation of patient's response to treatment, examination of patient, obtaining history from patient or surrogate, ordering and performing treatments and interventions, ordering and review of laboratory studies, ordering and review of radiographic studies, pulse oximetry and re-evaluation of patient's condition.

## 2022-05-09 NOTE — Progress Notes (Signed)
Pharmacy Antibiotic Note  Patricia [name] is a 51 y.o. female admitted on 04/23/2022 with sepsis.  Pharmacy has been consulted for meropenem dosing.  Pt presented for hernia repair, now with resolved cardiogenic and septic shock s/p inotropes and vasopressors. Pt remains on mechanical ventilation. Pt has acute renal failure requiring CRRT. WBC continues to decrease on meropenem after stopping steroids, pt remains afebrile. CT 10/9 showed RLL PNA and possibly infectious enteritis. Per discussion with CCM today, will continue meropenem for 7-10 days given unresolved enteritis. Will monitor for transition to iHD.  Plan: Continue meropenem 1g IV every 8 hours  F/u signs and symptoms of infection and cultures  Height: '5\' 4"'$  (162.6 cm) Weight: 109.7 kg (241 lb 13.5 oz) IBW/kg (Calculated) : 54.7  Temp (24hrs), Avg:98.4 F (36.9 C), Min:97.7 F (36.5 C), Max:99.3 F (37.4 C)  Recent Labs  Lab 05/02/22 1952 05/03/22 0411 05/03/22 1229 05/03/22 1523 05/03/22 1707 05/03/22 1720 05/04/22 0417 05/05/22 0426 05/05/22 5374 05/06/22 0415 05/06/22 1600 05/07/22 0428 05/07/22 1548 05/08/22 0426 05/08/22 1846 05/09/22 0422  WBC  --    < >  --   --   --   --    < > 34.9*  --  43.7*  --  47.3*  --  33.0*  --  27.9*  CREATININE  --    < >  --   --    < >  --    < >  --    < > 1.91*   < > 2.06* 1.93* 2.04* 1.90* 1.96*  LATICACIDVEN 2.8*  --  2.0* 2.0*  --  1.9  --   --   --   --   --   --   --   --   --   --    < > = values in this interval not displayed.     Estimated Creatinine Clearance: 40.7 mL/min (A) (by C-G formula based on SCr of 1.96 mg/dL (H)).    Allergies  Allergen Reactions   Iodinated Contrast Media Shortness Of Breath, Other (See Comments) and Cough    Throat itching    Peanut-Containing Drug Products Anaphylaxis    Peanut Oil Only.  Can eat peanuts with no problems   Oxycodone-Acetaminophen Itching    Antimicrobials this admission: Zosyn 10/4 >> 10/9  Vanc 10/6 >>  10/10 Meropenem 10/9 >>  Dose adjustments this admission: N/A  Microbiology results: 10/2 BCx: NG 10/3 BCxL NG 10/2 MRSA PCR: neg 10/9 Sputum cx: NG  Thank you for allowing pharmacy to participate in this patient's care.  Reatha Harps, PharmD PGY2 Pharmacy Resident 05/09/2022 1:20 PM Check [email or web address] for unit specific pharmacy number

## 2022-05-09 NOTE — Progress Notes (Signed)
Patient ID: NIJA KOOPMAN, female   DOB: 1971/01/17, 51 y.o.   MRN: [id number] S: No events overnight.  Pt seen and examined while on CRRT.  Labs and orders reviewed and appropriate adjustments made.  Off pressors but still with high daily input. O:BP 117/63   Pulse 95   Temp 98 F (36.7 C) (Axillary)   Resp (!) 25   Ht '5\' 4"'$  (1.626 m)   Wt 109.7 kg   LMP 07/07/2014 (Approximate)   SpO2 100%   BMI 41.51 kg/m   Intake/Output Summary (Last 24 hours) at 05/09/2022 0954 Last data filed at 05/09/2022 0900 Gross per 24 hour  Intake 3463.21 ml  Output 4228 ml  Net -764.79 ml   Intake/Output: I/O last 3 completed shifts: In: 5325.6 [I.V.:3315.4; NG/GT:1309.2; IV [id number]:378] Out: 5885 [Stool:385; Blood:48]  Intake/Output this shift:  Total I/O In: 305.8 [I.V.:185.8; NG/GT:120] Out: 505 [Stool:35] Weight change: -3 kg OYD:XAJOINOMV and sedated CVS:RRR Resp: ventilated BS bilaterally Abd: +BS, soft Ext:   Recent Labs  Lab 05/05/22 0614 05/05/22 1510 05/06/22 0415 05/06/22 1600 05/07/22 0428 05/07/22 1548 05/07/22 2146 05/08/22 0426 05/08/22 1846 05/09/22 0422 05/09/22 0836  NA 138   < > 136 137 137 136 137 137 138 140  --   K 3.6   < > 4.1 4.0 4.1 3.8 3.6 3.6 4.2 4.2  --   CL 106   < > 104 102 103 102  --  105 107 103  --   CO2 23   < > '23 26 24 24  '$ --  '25 23 23  '$ --   GLUCOSE 189*   < > 183* 222* 210* 261*  --  208* 211* 196*  --   BUN 34*   < > 35* 36* 39* 40*  --  42* 44* 43*  --   CREATININE 1.97*   < > 1.91* 1.99* 2.06* 1.93*  --  2.04* 1.90* 1.96*  --   ALBUMIN <1.5*   < > <1.5* 1.5* <1.5* 1.5*  --  <1.5* <1.5* <1.5* <1.5*  CALCIUM 7.0*   < > 7.0* 7.0* 7.0* 7.2*  --  7.1* 7.3* 7.6*  --   PHOS  --    < > 3.3 3.4 4.3 3.8  --  3.8 4.0 4.3  --   AST 331*  --   --   --   --   --   --   --   --   --  385*  ALT 248*  --   --   --   --   --   --   --   --   --  324*   < > = values in this interval not displayed.   Liver Function Tests: Recent Labs  Lab  05/05/22 0614 05/05/22 1510 05/08/22 1846 05/09/22 0422 05/09/22 0836  AST 331*  --   --   --  385*  ALT 248*  --   --   --  324*  ALKPHOS 167*  --   --   --  216*  BILITOT 1.3*  --   --   --  1.6*  PROT 4.6*  --   --   --  5.2*  ALBUMIN <1.5*   < > <1.5* <1.5* <1.5*   < > = values in this interval not displayed.   No results for input(s): "LIPASE", "AMYLASE" in the last 168 hours. No results for input(s): "AMMONIA" in the last 168 hours. CBC: Recent Labs  Lab 05/05/22 0426 05/05/22 2108 05/06/22 0415 05/07/22 0428 05/07/22 2146 05/08/22 0426 05/09/22 0422  WBC 34.9*  --  43.7* 47.3*  --  33.0* 27.9*  NEUTROABS 31.4*  --  35.1* 35.8*  --   --   --   HGB 9.4*   < > 9.8* 8.6* 8.8* 7.9* 7.8*  HCT 27.8*   < > 28.2* 25.5* 26.0* 23.4* 23.8*  MCV 83.5  --  79.4* 81.7  --  80.4 82.9  PLT 39*  --  53* 102*  --  144* 153   < > = values in this interval not displayed.   Cardiac Enzymes: No results for input(s): "CKTOTAL", "CKMB", "CKMBINDEX", "TROPONINI" in the last 168 hours. CBG: Recent Labs  Lab 05/08/22 1624 05/08/22 2004 05/08/22 2338 05/09/22 0342 05/09/22 0807  GLUCAP 184* 196* 200* 183* 175*    Iron Studies: No results for input(s): "IRON", "TIBC", "TRANSFERRIN", "FERRITIN" in the last 72 hours. Studies/Results: No results found.  Chlorhexidine Gluconate Cloth  6 each Topical Daily   docusate  100 mg Per Tube BID   heparin injection (subcutaneous)  5,000 Units Subcutaneous Q8H   insulin aspart  0-20 Units Subcutaneous Q4H   insulin aspart  7 Units Subcutaneous Q4H   mouth rinse  15 mL Mouth Rinse Q2H   pantoprazole (PROTONIX) IV  40 mg Intravenous Q24H   polyethylene glycol  17 g Per Tube Daily   sodium chloride flush  10-40 mL Intracatheter Q12H   sodium chloride flush  3 mL Intravenous Q12H    BMET    Component Value Date/Time   NA 140 05/09/2022 0422   NA 139 07/02/2014 1923   K 4.2 05/09/2022 0422   K 3.9 07/02/2014 1923   CL 103 05/09/2022 0422    CL 105 07/02/2014 1923   CO2 23 05/09/2022 0422   CO2 26 07/02/2014 1923   GLUCOSE 196 (H) 05/09/2022 0422   GLUCOSE 95 07/02/2014 1923   BUN 43 (H) 05/09/2022 0422   BUN 7 07/02/2014 1923   CREATININE 1.96 (H) 05/09/2022 0422   CREATININE 0.98 07/02/2014 1923   CALCIUM 7.6 (L) 05/09/2022 0422   CALCIUM 8.5 07/02/2014 1923   GFRNONAA 30 (L) 05/09/2022 0422   GFRNONAA >60 07/02/2014 1923   GFRAA >60 04/22/2018 2215   GFRAA >60 07/02/2014 1923   CBC    Component Value Date/Time   WBC 27.9 (H) 05/09/2022 0422   RBC 2.87 (L) 05/09/2022 0422   HGB 7.8 (L) 05/09/2022 0422   HGB 9.4 (L) 07/02/2014 1923   HCT 23.8 (L) 05/09/2022 0422   HCT 30.3 (L) 07/02/2014 1923   PLT 153 05/09/2022 0422   PLT 449 (H) 07/02/2014 1923   MCV 82.9 05/09/2022 0422   MCV 71 (L) 07/02/2014 1923   MCH 27.2 05/09/2022 0422   MCHC 32.8 05/09/2022 0422   RDW 14.8 05/09/2022 0422   RDW 18.8 (H) 07/02/2014 1923   LYMPHSABS 2.0 05/07/2022 0428   MONOABS 2.4 (H) 05/07/2022 0428   EOSABS 0.0 05/07/2022 0428   BASOSABS 0.1 05/07/2022 0428    Assessment/Plan:   AKI - likely ATN in the setting of cardiogenic and septic shock.  Started on CRRT 05/01/22 after RIJ temp HD catheter placed.  Off pressors and would like to attempt IHD, however large intake each day (over 3 liters).  Ok to try high dose lasix challenge and see if her UOP improves.  If we can concentrate her IVF's, we may be able to transition to  IHD this weekend. All fluids 4K/2.5Ca, pre and post-filter rates at 400 mL/hr, dialysate rate 1500 mL/hr, no anticoagulation due to thrombocytopenia.  UF goal for CVP of 7-9 mmHg.  To keep even again today due to low CVP Remains anuric, continue with CRRT as ordered. Cardiogenic/septic shock - weaned off levophed, on dobutamine.  Heart failure team following. Likely due to aspiration pneumonia. Acute hypoxic respiratory failure - was re-intubated 05/05/22.  Vent per PCCM and possible trach. Infectious  enteritis - seen on CT scan.  WBC continues to climb.  Plan per PCCM. Anemia of critical illness - transfuse for Hgb <7. Acute MI - normal coronaries by cath [facility] - improved with CRRT. Thrombocytopenia - improving.  S/p robotic incisional hernia repair with mesh and LOA - Surgery following.   Donetta Potts, MD [facility]

## 2022-05-09 NOTE — Progress Notes (Signed)
PT Cancellation Note  Patient Details Name: Patricia [name] MRN: [id number] DOB: 26-Aug-1970   Cancelled Treatment:    Reason Eval/Treat Not Completed: Patient not medically ready (pt remains intubated and not medically stable to mobilize with RR 44. Will sign off and await reorder as pt able to participate, discussed with RN)   Sandy Salaam Rheannon Cerney 05/09/2022, 9:53 AM [place] Office: [phone]

## 2022-05-09 NOTE — Progress Notes (Signed)
PHARMACY - TOTAL PARENTERAL NUTRITION CONSULT NOTE  Indication: Small bowel obstruction and ileus  Patient Measurements: Height: 5' 4" (162.6 cm) Weight: 109.7 kg (241 lb 13.5 oz) IBW/kg (Calculated) : 54.7 TPN AdjBW (KG): 68.4 Body mass index is 41.51 kg/m.  Assessment:  51 yo female s/p difficult incisional hernia repair with long LoA on 9/26.  Patient developed a SBO based on CT scan 10/2.  Poor PO intake since surgery due to abdominal pain.  Pharmacy consulted to manage TPN.  Patient was extubated and then re-intubated on 10/8 due to respiratory distress. Initiated on TF on 10/9.  Glucose / Insulin: A1c 6.3% - CBGs 170-210s Used 24 units rSSI (up), Novolog 7 units Q4H, 55 units in TPN Electrolytes: coCa 9.6, Mag 2.5 (none in TPN), others WNL Renal: CRRT started 10/4, BUN 43 Hepatic: AST/ALT up 335/324, alk phos up to 216, tbili 1.6, TG 180 Intake / Output; MIVF: anuric, NG 14m, stool 2366m CRRT 3.5L, net +16.6L GI Imaging: 10/3 CT: continued SBO  10/2 CT: SBO 10/9 CT: infectious or inflammatory enteritis GI Surgeries / Procedures: none since TPN initiation   Central access: CVC triple lumen placed 05/04/22 TPN start date: 04/29/22  Nutritional Goals: RD Estimated Needs Total Energy Estimated Needs: 1800-2000 kcal/d Total Protein Estimated Needs: 110-130 grams Total Fluid Estimated Needs: 1.8-2L/d  Current Nutrition:  TPN TF (Vital 1.5) @ 4083mr (providing 65g protein, 1440 kcal per 24 hrs) (goal rate 35m33m)  Plan:  Continue concentrated TPN at 70 ml/hr to provide 124g AA and 1848 kCal, meeting 100% of needs.  Lipids at 20% of kCal (added back 10/6). Electrolytes in TPN: Na 125mE70m K 0mEq/59mCa increased to 9mEq/L27mg 0mEq/L,63mos decreased to 18mmol/L39m:Ac 1:2 Add standard MVI and trace elements to TPN, chromium removed for CRRT  Continue to replace K outside of TPN as needed (none necessary today) Continue resistant SSI Q4H and increase regular insulin to 70  units in TPN with advancing TF Novolog 7 units Q4H per CCM Renal function panel BID and daily Mag while on CRRT per Renal TG level weekly on Mondays Monitor BG as TFs slowly advancing to goal Plan to continue TPN until pt tolerating goal TFs given previous GI complications during hospitalization  Errica Dutil JarLuisa Hart BCPS Clinical Pharmacist 05/09/2022 7:59 AM   Please refer to AMION for pharmacy phone number

## 2022-05-09 NOTE — Progress Notes (Signed)
NAME:  Patricia [name], MRN:  [id number], DOB:  [birth year], LOS: 32 ADMISSION DATE:  04/23/2022, CONSULTATION DATE:  04/29/2022 REFERRING MD:  Felicie Morn, MD, CHIEF COMPLAINT:   Small bowel obstruction, aspiration event, sepsis, respiratory failure    History of Present Illness:  [name] with above hx and nuc study in 2015 neg for ischemia done for chest pain and echo with EF 60-65% presented to hospital for excisional hernia repair (robotic hernia repair 04/23/22) with mesh, ileus post op on TNA. Developed vomiting and hypotension after NG came out on 04/29/22 and PNA on CXR, was tachycardic and BP improved with IV fluids. DX with sepsis and ABX started. At one point prolonged Qtc. Pt admitted to ICU on 10/2.  Was placed on levophed. Early AM on 10/3 was intubated for worsening metabolic/lactic acidosis. She had AKI as well. Crt was 6.01, her normal 0.9. Today Cr is 4 on CRRT.  Pertinent  Medical History  DM2 Depression OSA Migraines  Asthma Obesity Schizophrenia HTN HLD  Significant Hospital Events: Including procedures, antibiotic start and stop dates in addition to other pertinent events   9/26 Robotic hernia repair complicated by abd adhesions.  10/2 CT + for developing SBO and bilat lower lobe opacities most likely aspiration PNA. Progressive decompensation requiring transfer to ICU 10/2 PCCM consulted for respiratory failure & shock 10\3 Intubated for resp failure. CVL and arterial line placed. 04/30/22: EF 30-35%, RV moderately reduced, mild to moderate MR, IVC collapsed throughout respiratory cycle 10\4 EKG changes/trop increased--> and taken to cath lab - No obstructive CAD, but in decompensated heart failure, Bedside echo with EF 15%, new WMA, moderate RV dysfunction, trivial pericardial effusion.underwent emergent L/RHC. Normal coronaries, RA mean 13, PA mean 29, LVEDP 16, PCWP 19, CO/CI 3.96/1.87   returned to CICU post-cath. Right heart cath placed right fem.  Right IJ HD cath placed.  10/5 advanced HF team following, shock supported on norepi/vasopressin and Dobutamine. On CRRT.  10/6 weaning Norepi. Starting to wean fent. Needing restraints.  10/8: Reintubated due to neuromuscular weakness.  Bedside echo demonstrating improved RV function, LV function remained depressed 10/9 CT abdomen pelvis.suggesting wall thickening throughout the proximal and mid small bowel consistent with possible infectious or inflammatory enteritis there is also bibasilar airspace disease which could reflect pneumonia.  Antibiotic coverage widened.  Zosyn changed to meropenem.  Tolerating trickle feeds still on TPN 10/10 stopped stress dose steroids.  Resumed subcutaneous heparin.  Interim History / Subjective:  Off sedation this morning, tachycardic in all vent modes. She is not answering questions this morning.  Objective   Blood pressure 124/67, pulse 97, temperature 99.3 F (37.4 C), temperature source Axillary, resp. rate (!) 33, height '5\' 4"'$  (1.626 m), weight 109.7 kg, last menstrual period 07/07/2014, SpO2 98 %. CVP:  [5 mmHg-16 mmHg] 7 mmHg  Vent Mode: PRVC FiO2 (%):  [30 %-40 %] 30 % Set Rate:  [20 bmp] 20 bmp Vt Set:  [460 mL] 460 mL PEEP:  [5 cmH20] 5 cmH20 Pressure Support:  [8 cmH20] 8 cmH20 Plateau Pressure:  [8 cmH20] 8 cmH20   Intake/Output Summary (Last 24 hours) at 05/09/2022 1418 Last data filed at 05/09/2022 1200 Gross per 24 hour  Intake 3233.1 ml  Output 3990 ml  Net -756.9 ml    Filed Weights   05/07/22 0352 05/08/22 0413 05/09/22 0202  Weight: 112.5 kg 112.7 kg 109.7 kg    Examination: General: critically ill appearing woman lying in bed in NAD, intubated, not  sedated  HEENT [place]/AT, eyes anicteric, ETT in place.  Pulmonary: Tachypneic in all vent modes, breathing significantly over the vent. CTAB, minimal ETT secretions Cardiac: S1S2, RRR Abdomen: obese, soft, NT, hypoactive bowel sounds Extremities: minimal edema, no  cyanosis Neuro: RASS 0, tracking more today, still not following commands.    Coox 72% BUN 43 Cr 1.96 on CRRT WBC 27.9 H/H 7.8/23.8 Platelets 153 Resp culture: NG, final  Resolved problems:   Assessment & Plan:   Circulatory shock, improving. Mixed picture of septic shock and acute biventricular HFrEF (suspect septic or stress induced CM given clean coronaries). Sepsis due to pneumonias and enteritis. -daily cooximetry -weaned off dobutamine -volume management with RRT; lasix trial after she clots off her CRRt filter. -con't broad spectrum antibiotics- meropenem. Needs 7-10 days total.  Acute hypoxic respiratory failure 2/2 aspiration pneumonia and compensation for metabolic acidosis. Previously failed extubation on 10/8, potentially due to weakness. Anticipate she may need tracheostomy.  RLL VAP It appears as though the etiology for respiratory failure was more generalized weakness but ongoing pneumonia likely contributed -LTVV -VAP prevention protocol -daily SBT today due to tachypnea -PAD protocol for sedation; unfortunately due to severe tachypnea have to turn sedation back up today. Adding precedex. -Will trach if she fails extubation again. Have discussed with surgery and her sister.  Oliguric AKI w/ AGMA 2/2 lactic acidosis  -CRRT per nephrology> will do lasix trial once off RRT -strict I/O -renally dose meds, avoid nephrotoxic meds -bladder US; may need to replace foley for lasix trial  Acute metabolic encephalopathy superimposed on h/o bipolar disease and schizophrenia  -not on PTA bipolar meds  -limit sedation as much as is feasible  S/p hernia repair c/b ileus. CT with inflammatory thickening. -con't TF at 40cc/h; adding prosource. If she continues to tolerate TF will advance to goal tomorrow. Hopefully we can come off TPN over the weekend. -appreciate surgery's management  Worsening leukocytosis, initially we thought secondary to steroids, however CT imaging  suggesting wall thickening throughout the proximal and mid small bowel consistent with possible infectious or inflammatory enteritis there is also bibasilar airspace disease which could reflect pneumonia -con't meropenem, monitor  Thrombocytopenia, likely sepsis related-resolved.  HIT assay negative, no LE DVTs. -heparin for DVT prophylaxis  Acute anemia, due to critical illness & renal failure -monitor for bleeding -transfuse for Hb <7 or hemodynamically significant bleeding  Hyperglycemia w/ h/o type II DM -TPN with insulin -SSI PRN -increase TF coverage 8 units Q4h with hold parameters -goal BG 140-180  Severe protein energy malnutrition -TF & TPN -hopefully can advance TF and d/c TPN over the weekend   Best Practice (right click and "Reselect all SmartList Selections" daily)   Diet/type: tubefeeds and TPN DVT prophylaxis: prophylactic heparin  GI prophylaxis: PPI Lines: Central line and Dialysis Catheter Foley:  Yes, and it is still needed Code Status:  full code   This patient is critically ill with multiple organ system failure which requires frequent high complexity decision making, assessment, support, evaluation, and titration of therapies. This was completed through the application of advanced monitoring technologies and extensive interpretation of multiple databases. During this encounter critical care time was devoted to patient care services described in this note for 40 minutes.  Julian Hy, DO 05/09/22 2:30 PM  Pulmonary & Critical Care

## 2022-05-09 NOTE — Progress Notes (Signed)
Subjective/Chief Complaint: WBC down a little today.  Tolerating TF.  Having bowel function. Tachypneic.   Objective: Vital signs in last 24 hours: Temp:  [97.7 F (36.5 C)-98.8 F (37.1 C)] 98 F (36.7 C) (10/12 0700) Pulse Rate:  [83-97] 97 (10/12 0833) Resp:  [23-41] 41 (10/12 0833) BP: (117-132)/(63-75) 117/63 (10/12 0833) SpO2:  [95 %-100 %] 99 % (10/12 0833) Arterial Line BP: (102-153)/(54-81) 127/68 (10/12 0800) FiO2 (%):  [40 %] 40 % (10/12 0833) Weight:  [109.7 kg] 109.7 kg (10/12 0202) Last BM Date : 05/09/22  Intake/Output from previous day: 10/11 0701 - 10/12 0700 In: 3420.7 [I.V.:2130.6; NG/GT:889.2; IV Piggyback:401] Out: 1751 [Stool:235; Blood:48] Intake/Output this shift: Total I/O In: 98.5 [I.V.:18.5; NG/GT:80] Out: 35 [Stool:35]  General: intubated, sedated, opens eyes CV: RRR HEENT: NG in place with feeds at 20 ml/hr; ETT Abdomen: soft, nondistended, incisions clean and dry with no erythema or induration.   Lab Results:  Recent Labs    05/08/22 0426 05/09/22 0422  WBC 33.0* 27.9*  HGB 7.9* 7.8*  HCT 23.4* 23.8*  PLT 144* 153    BMET Recent Labs    05/08/22 1846 05/09/22 0422  NA 138 140  K 4.2 4.2  CL 107 103  CO2 23 23  GLUCOSE 211* 196*  BUN 44* 43*  CREATININE 1.90* 1.96*  CALCIUM 7.3* 7.6*    PT/INR No results for input(s): "LABPROT", "INR" in the last 72 hours. ABG Recent Labs    05/07/22 2146  PHART 7.506*  HCO3 23.7     Studies/Results: No results found.  Anti-infectives: Anti-infectives (From admission, onward)    Start     Dose/Rate Route Frequency Ordered Stop   05/06/22 1530  meropenem (MERREM) 1 g in sodium chloride 0.9 % 100 mL IVPB        1 g 200 mL/hr over 30 Minutes Intravenous Every 8 hours 05/06/22 1438     05/04/22 1000  vancomycin (VANCOCIN) IVPB 1000 mg/200 mL premix  Status:  Discontinued        1,000 mg 200 mL/hr over 60 Minutes Intravenous Every 24 hours 05/03/22 0802 05/08/22 0913    05/03/22 0845  vancomycin (VANCOREADY) IVPB 2000 mg/400 mL        2,000 mg 200 mL/hr over 120 Minutes Intravenous  Once 05/03/22 0751 05/03/22 1114   05/01/22 1800  piperacillin-tazobactam (ZOSYN) IVPB 3.375 g  Status:  Discontinued        3.375 g 12.5 mL/hr over 240 Minutes Intravenous Every 6 hours 05/01/22 1426 05/01/22 1427   05/01/22 1515  piperacillin-tazobactam (ZOSYN) IVPB 3.375 g  Status:  Discontinued        3.375 g 100 mL/hr over 30 Minutes Intravenous Every 6 hours 05/01/22 1428 05/06/22 1438   04/30/22 1400  piperacillin-tazobactam (ZOSYN) IVPB 2.25 g  Status:  Discontinued        2.25 g 100 mL/hr over 30 Minutes Intravenous Every 8 hours 04/30/22 1325 05/01/22 1426   04/29/22 1530  piperacillin-tazobactam (ZOSYN) IVPB 3.375 g  Status:  Discontinued        3.375 g 12.5 mL/hr over 240 Minutes Intravenous Every 8 hours 04/29/22 1433 04/30/22 1325   04/29/22 1145  metroNIDAZOLE (FLAGYL) IVPB 500 mg  Status:  Discontinued        500 mg 100 mL/hr over 60 Minutes Intravenous Every 12 hours 04/29/22 1047 04/29/22 1433   04/29/22 1145  vancomycin (VANCOCIN) IVPB 1000 mg/200 mL premix  Status:  Discontinued  1,000 mg 200 mL/hr over 60 Minutes Intravenous  Once 04/29/22 1047 04/29/22 1055   04/29/22 1145  vancomycin (VANCOREADY) IVPB 1500 mg/300 mL  Status:  Discontinued        1,500 mg 150 mL/hr over 120 Minutes Intravenous Every 24 hours 04/29/22 1055 04/29/22 1357   04/29/22 1045  ceFEPIme (MAXIPIME) 2 g in sodium chloride 0.9 % 100 mL IVPB  Status:  Discontinued        2 g 200 mL/hr over 30 Minutes Intravenous Every 8 hours 04/29/22 0954 04/29/22 1433   04/23/22 0730  ceFAZolin (ANCEF) IVPB 2g/100 mL premix        2 g 200 mL/hr over 30 Minutes Intravenous On call to O.R. 04/23/22 0726 04/23/22 1610       Assessment/Plan: Ms. Gamboa is s/p robotic incisional hernia repair on 04/23/22 -Postop ileus complicated by aspiration: Ileus resolved, having bowel function.  -CT  abd/pelvis reviewed. Proximal small bowel is thickened but there is no pneumatosis or portal venous gas to suggest ischemia. I wonder if this is a post-ischemic thickening of the small intestine that will improve with time. - Okay for tube feeds as tolerated and decreasing TPN.   Cardiogenic/septic shock -Antibiotics per CCM -cath 10/4 - no obstructive CAD - Remains on dobutamine  AKI -CRRT  Resp failure -Vent management per CCM. Reasonable to proceed with tracheostomy if deemed necessary by critical care team.   Felicie Morn, MD General, Bariatric and Minimally Invasive Surgery [facility]    05/09/22 8:47 AM

## 2022-05-10 ENCOUNTER — Inpatient Hospital Stay (HOSPITAL_COMMUNITY): Payer: Medicaid Other

## 2022-05-10 DIAGNOSIS — A419 Sepsis, unspecified organism: Secondary | ICD-10-CM | POA: Diagnosis not present

## 2022-05-10 DIAGNOSIS — J9601 Acute respiratory failure with hypoxia: Secondary | ICD-10-CM

## 2022-05-10 DIAGNOSIS — I5021 Acute systolic (congestive) heart failure: Secondary | ICD-10-CM | POA: Diagnosis not present

## 2022-05-10 DIAGNOSIS — Z9911 Dependence on respirator [ventilator] status: Secondary | ICD-10-CM | POA: Diagnosis not present

## 2022-05-10 DIAGNOSIS — R6521 Severe sepsis with septic shock: Secondary | ICD-10-CM | POA: Diagnosis not present

## 2022-05-10 DIAGNOSIS — N179 Acute kidney failure, unspecified: Secondary | ICD-10-CM | POA: Diagnosis not present

## 2022-05-10 LAB — RENAL FUNCTION PANEL
Albumin: <1.5 g/dL — ABNORMAL LOW (ref 3.5–5.0)
Albumin: 1.6 g/dL — ABNORMAL LOW (ref 3.5–5.0)
Anion gap: 6 (ref 5–15)
Anion gap: 10 (ref 5–15)
BUN: 51 mg/dL — ABNORMAL HIGH (ref 6–20)
BUN: 51 mg/dL — ABNORMAL HIGH (ref 6–20)
CO2: 25 mmol/L (ref 22–32)
CO2: 22 mmol/L (ref 22–32)
Calcium: 7.2 mg/dL — ABNORMAL LOW (ref 8.9–10.3)
Calcium: 7.7 mg/dL — ABNORMAL LOW (ref 8.9–10.3)
Chloride: 107 mmol/L (ref 98–111)
Chloride: 107 mmol/L (ref 98–111)
Creatinine, Ser: 2.22 mg/dL — ABNORMAL HIGH (ref 0.44–1.00)
Creatinine, Ser: 2.15 mg/dL — ABNORMAL HIGH (ref 0.44–1.00)
GFR, Estimated: 26 mL/min — ABNORMAL LOW (ref >60)
GFR, Estimated: 27 mL/min — ABNORMAL LOW (ref >60)
Glucose, Bld: 144 mg/dL — ABNORMAL HIGH (ref 70–99)
Glucose, Bld: 148 mg/dL — ABNORMAL HIGH (ref 70–99)
Phosphorus: 4.2 mg/dL (ref 2.5–4.6)
Phosphorus: 4.3 mg/dL (ref 2.5–4.6)
Potassium: 4 mmol/L (ref 3.5–5.1)
Potassium: 4.3 mmol/L (ref 3.5–5.1)
Sodium: 138 mmol/L (ref 135–145)
Sodium: 139 mmol/L (ref 135–145)

## 2022-05-10 LAB — ECHOCARDIOGRAM COMPLETE
AR max vel: 3.44 cm2
AV Area VTI: 3.38 cm2
AV Area mean vel: 3.32 cm2
AV Mean grad: 7 mmHg
AV Peak grad: 12.8 mmHg
Ao pk vel: 1.79 m/s
Area-P 1/2: 2.61 cm2
Height: 64 in
S' Lateral: 3.6 cm
Weight: 3834.24 oz

## 2022-05-10 LAB — CBC
HCT: 22.6 % — ABNORMAL LOW (ref 36.0–46.0)
Hemoglobin: 7.4 g/dL — ABNORMAL LOW (ref 12.0–15.0)
MCH: 27.1 pg (ref 26.0–34.0)
MCHC: 32.7 g/dL (ref 30.0–36.0)
MCV: 82.8 fL (ref 80.0–100.0)
Platelets: 182 10*3/uL (ref 150–400)
RBC: 2.73 MIL/uL — ABNORMAL LOW (ref 3.87–5.11)
RDW: 16.7 % — ABNORMAL HIGH (ref 11.5–15.5)
WBC: 23.5 10*3/uL — ABNORMAL HIGH (ref 4.0–10.5)
nRBC: 1.8 % — ABNORMAL HIGH (ref 0.0–0.2)

## 2022-05-10 LAB — COOXEMETRY PANEL
Carboxyhemoglobin: 0.3 % — ABNORMAL LOW (ref 0.5–1.5)
Carboxyhemoglobin: 1.4 % (ref 0.5–1.5)
Carboxyhemoglobin: 1.2 % (ref 0.5–1.5)
Methemoglobin: 0.8 % (ref 0.0–1.5)
Methemoglobin: <0.7 % (ref 0.0–1.5)
Methemoglobin: <0.7 % (ref 0.0–1.5)
O2 Saturation: 38.7 %
O2 Saturation: 46.8 %
O2 Saturation: 59 %
Total hemoglobin: 7.8 g/dL — ABNORMAL LOW (ref 12.0–16.0)
Total hemoglobin: 7.6 g/dL — ABNORMAL LOW (ref 12.0–16.0)
Total hemoglobin: <6.9 g/dL — CL (ref 12.0–16.0)

## 2022-05-10 LAB — GLUCOSE, CAPILLARY
Glucose-Capillary: 136 mg/dL — ABNORMAL HIGH (ref 70–99)
Glucose-Capillary: 89 mg/dL (ref 70–99)
Glucose-Capillary: 120 mg/dL — ABNORMAL HIGH (ref 70–99)
Glucose-Capillary: 133 mg/dL — ABNORMAL HIGH (ref 70–99)
Glucose-Capillary: 139 mg/dL — ABNORMAL HIGH (ref 70–99)

## 2022-05-10 LAB — HEMOGLOBIN AND HEMATOCRIT, BLOOD
HCT: 21.5 % — ABNORMAL LOW (ref 36.0–46.0)
Hemoglobin: 7 g/dL — ABNORMAL LOW (ref 12.0–15.0)

## 2022-05-10 LAB — MAGNESIUM: Magnesium: 2.7 mg/dL — ABNORMAL HIGH (ref 1.7–2.4)

## 2022-05-10 MED ORDER — FUROSEMIDE 10 MG/ML IJ SOLN: 120.0000 mg | Freq: Two times a day (BID) | INTRAVENOUS | Status: DC

## 2022-05-10 MED ORDER — PEPTAMEN 1.5 PO LIQD
1000.0000 mL | ORAL | Status: DC
  Administered 2022-05-10 – 2022-05-23 (×13): 1000 mL
  Filled 2022-05-10 (×20): qty 1000

## 2022-05-10 MED ORDER — INSULIN ASPART 100 UNIT/ML IJ SOLN
5.0000 [IU] | INTRAMUSCULAR | Status: DC
  Administered 2022-05-10 – 2022-05-11 (×3): 5 [IU] via SUBCUTANEOUS

## 2022-05-10 MED ORDER — DOBUTAMINE IN D5W 4-5 MG/ML-% IV SOLN
2.5000 ug/kg/min | INTRAVENOUS | Status: DC
  Filled 2022-05-10: qty 250

## 2022-05-10 MED ORDER — ALBUMIN HUMAN 5 % IV SOLN
12.5000 g | Freq: Once | INTRAVENOUS | Status: AC
  Administered 2022-05-10: 12.5 g via INTRAVENOUS
  Filled 2022-05-10: qty 250

## 2022-05-10 MED ORDER — TRACE MINERALS CU-MN-SE-ZN 300-55-60-3000 MCG/ML IV SOLN
INTRAVENOUS | Status: AC
  Filled 2022-05-10: qty 414.4

## 2022-05-10 MED ORDER — PERFLUTREN LIPID MICROSPHERE
1.0000 mL | INTRAVENOUS | Status: AC | PRN
  Administered 2022-05-10: 3 mL via INTRAVENOUS

## 2022-05-10 MED ORDER — INSULIN ASPART 100 UNIT/ML IJ SOLN: 6.0000 [IU] | INTRAMUSCULAR | Status: DC

## 2022-05-10 NOTE — Progress Notes (Signed)
Patient ID: Patricia [name], female   DOB: 1970/09/14, 51 y.o.   MRN: [id number] S:No events overnight.  Pt seen and examined while on CRRT.  Labs and orders reviewed and adjustments made accordingly.  O:BP (!) 145/73   Pulse (!) 103   Temp 99 F (37.2 C)   Resp (!) 26   Ht '5\' 4"'$  (1.626 m)   Wt 108.7 kg   LMP 07/07/2014 (Approximate)   SpO2 100%   BMI 41.13 kg/m   Intake/Output Summary (Last 24 hours) at 05/10/2022 1029 Last data filed at 05/10/2022 0900 Gross per 24 hour  Intake 4294.76 ml  Output 4378 ml  Net -83.24 ml   Intake/Output: I/O last 3 completed shifts: In: 6247.7 [I.V.:3897.7; Other:30; NG/GT:1819.7; IV Piggyback:500.4] Out: 8841 [Stool:1860; Blood:48]  Intake/Output this shift:  Total I/O In: 307 [I.V.:207; NG/GT:100] Out: 252 [Emesis/NG output:60] Weight change: -1 kg Gen: on vent, sedated CVS: tachy Resp: ventilated BS bilaterally Abd: +BS, soft, NT/ND Ext: 1+ edema of hands/feet  Recent Labs  Lab 05/05/22 0614 05/05/22 1510 05/07/22 0428 05/07/22 1548 05/07/22 2146 05/08/22 0426 05/08/22 1846 05/09/22 0422 05/09/22 0836 05/09/22 1549 05/10/22 0403  NA 138   < > 137 136 137 137 138 140  --  138 138  K 3.6   < > 4.1 3.8 3.6 3.6 4.2 4.2  --  4.3 4.0  CL 106   < > 103 102  --  105 107 103  --  106 107  CO2 23   < > 24 24  --  '25 23 23  '$ --  23 25  GLUCOSE 189*   < > 210* 261*  --  208* 211* 196*  --  139* 144*  BUN 34*   < > 39* 40*  --  42* 44* 43*  --  47* 51*  CREATININE 1.97*   < > 2.06* 1.93*  --  2.04* 1.90* 1.96*  --  2.15* 2.22*  ALBUMIN <1.5*   < > <1.5* 1.5*  --  <1.5* <1.5* <1.5* <1.5* <1.5* <1.5*  CALCIUM 7.0*   < > 7.0* 7.2*  --  7.1* 7.3* 7.6*  --  7.1* 7.2*  PHOS  --    < > 4.3 3.8  --  3.8 4.0 4.3  --  4.0 4.2  AST 331*  --   --   --   --   --   --   --  385*  --   --   ALT 248*  --   --   --   --   --   --   --  324*  --   --    < > = values in this interval not displayed.   Liver Function Tests: Recent Labs  Lab  05/05/22 0614 05/05/22 1510 05/09/22 0836 05/09/22 1549 05/10/22 0403  AST 331*  --  385*  --   --   ALT 248*  --  324*  --   --   ALKPHOS 167*  --  216*  --   --   BILITOT 1.3*  --  1.6*  --   --   PROT 4.6*  --  5.2*  --   --   ALBUMIN <1.5*   < > <1.5* <1.5* <1.5*   < > = values in this interval not displayed.   No results for input(s): "LIPASE", "AMYLASE" in the last 168 hours. No results for input(s): "AMMONIA" in the last 168 hours. CBC: Recent Labs  Lab 05/05/22 0426 05/05/22 2108 05/06/22 0415 05/07/22 0428 05/07/22 2146 05/08/22 0426 05/09/22 0422 05/10/22 0719  WBC 34.9*  --  43.7* 47.3*  --  33.0* 27.9* 23.5*  NEUTROABS 31.4*  --  35.1* 35.8*  --   --   --   --   HGB 9.4*   < > 9.8* 8.6*   < > 7.9* 7.8* 7.4*  HCT 27.8*   < > 28.2* 25.5*   < > 23.4* 23.8* 22.6*  MCV 83.5  --  79.4* 81.7  --  80.4 82.9 82.8  PLT 39*  --  53* 102*  --  144* 153 182   < > = values in this interval not displayed.   Cardiac Enzymes: No results for input(s): "CKTOTAL", "CKMB", "CKMBINDEX", "TROPONINI" in the last 168 hours. CBG: Recent Labs  Lab 05/09/22 1526 05/09/22 1956 05/09/22 2320 05/10/22 0406 05/10/22 0801  GLUCAP 127* 129* 125* 136* 89    Iron Studies: No results for input(s): "IRON", "TIBC", "TRANSFERRIN", "FERRITIN" in the last 72 hours. Studies/Results: ECHOCARDIOGRAM COMPLETE  Result Date: 05/10/2022    ECHOCARDIOGRAM REPORT   Patient Name:   Patricia [name] [facility] Date of Exam: 05/10/2022 Medical Rec #:  [id number]          Height:       64.0 in Accession #:    [id number]         Weight:       239.6 lb Date of Birth:  September 19, 1970          BSA:          2.113 m Patient Age:    69 years           BP:           145/73 mmHg Patient Gender: F                  HR:           93 bpm. Exam Location:  Inpatient Procedure: 2D Echo, Cardiac Doppler, Color Doppler and Intracardiac            Opacification Agent Indications:    CHF  History:        Patient has prior history of  Echocardiogram examinations, most                 recent 05/02/2022. Arrythmias:Tachycardia; Risk                 Factors:Hypertension and Diabetes. Abnormal ECG. Bacteremia.  Sonographer:    Clayton Lefort RDCS (AE) Referring Phys: [id number] AMY D CLEGG  Sonographer Comments: Technically difficult study due to poor echo windows, echo performed with patient supine and on artificial respirator and patient is obese. Image acquisition challenging due to patient body habitus and Image acquisition challenging due to respiratory motion. IMPRESSIONS  1. Study not well visualized and limited.. Left ventricular ejection fraction, by estimation, is 50 to 55%. The left ventricle has low normal function. The left ventricle has no regional wall motion abnormalities. Left ventricular diastolic parameters are consistent with Grade I diastolic dysfunction (impaired relaxation).  2. Right ventricular systolic function is normal. The right ventricular size is normal.  3. No evidence of mitral valve regurgitation.  4. Aortic valve regurgitation is not visualized.  5. The inferior vena cava is normal in size with greater than 50% respiratory variability, suggesting right atrial pressure of 3 mmHg. Conclusion(s)/Recommendation(s): Study is not well visualized. EF appears improved from prior study 05/02/2022. FINDINGS  Left Ventricle: Study not well visualized and limited. Left ventricular ejection fraction, by estimation, is 50 to 55%. The left ventricle has low normal function. The left ventricle has no regional wall motion abnormalities. Definity contrast agent was  given IV to delineate the left ventricular endocardial borders. The left ventricular internal cavity size was normal in size. There is borderline left ventricular hypertrophy. Left ventricular diastolic parameters are consistent with Grade I diastolic dysfunction (impaired relaxation). Right Ventricle: The right ventricular size is normal. Right ventricular systolic function is  normal. Left Atrium: Left atrial size was normal in size. Right Atrium: Right atrial size was normal in size. Pericardium: There is no evidence of pericardial effusion. Mitral Valve: No evidence of mitral valve regurgitation. Aortic Valve: Aortic valve regurgitation is not visualized. Aortic valve mean gradient measures 7.0 mmHg. Aortic valve peak gradient measures 12.8 mmHg. Aortic valve area, by VTI measures 3.38 cm. Aorta: The aortic root is normal in size and structure. Venous: The inferior vena cava is normal in size with greater than 50% respiratory variability, suggesting right atrial pressure of 3 mmHg.  LEFT VENTRICLE PLAX 2D LVIDd:         4.40 cm   Diastology LVIDs:         3.60 cm   LV e' medial:    6.74 cm/s LV PW:         1.10 cm   LV E/e' medial:  11.7 LV IVS:        1.00 cm   LV e' lateral:   12.20 cm/s LVOT diam:     2.40 cm   LV E/e' lateral: 6.4 LV SV:         87 LV SV Index:   41 LVOT Area:     4.52 cm  RIGHT VENTRICLE RV Basal diam:  2.40 cm RV S prime:     18.60 cm/s TAPSE (M-mode): 2.1 cm LEFT ATRIUM             Index        RIGHT ATRIUM          Index LA diam:        3.20 cm 1.51 cm/m   RA Area:     9.71 cm LA Vol (A2C):   49.1 ml 23.24 ml/m  RA Volume:   18.00 ml 8.52 ml/m LA Vol (A4C):   35.4 ml 16.76 ml/m LA Biplane Vol: 41.9 ml 19.83 ml/m  AORTIC VALVE AV Area (Vmax):    3.44 cm AV Area (Vmean):   3.32 cm AV Area (VTI):     3.38 cm AV Vmax:           179.00 cm/s AV Vmean:          118.000 cm/s AV VTI:            0.258 m AV Peak Grad:      12.8 mmHg AV Mean Grad:      7.0 mmHg LVOT Vmax:         136.00 cm/s LVOT Vmean:        86.500 cm/s LVOT VTI:          0.193 m LVOT/AV VTI ratio: 0.75  AORTA Ao Root diam: 3.80 cm MITRAL VALVE MV Area (PHT): 2.61 cm    SHUNTS MV Decel Time: 291 msec    Systemic VTI:  0.19 m MV E velocity: 78.60 cm/s  Systemic Diam: 2.40 cm MV A velocity: 82.80 cm/s MV E/A ratio:  0.95 Stanton Kidney  Branch Electronically signed by Phineas Inches Signature Date/Time:  05/10/2022/9:28:37 AM    Final     Chlorhexidine Gluconate Cloth  6 each Topical Daily   feeding supplement (PROSource TF20)  60 mL Per Tube BID   heparin injection (subcutaneous)  5,000 Units Subcutaneous Q8H   insulin aspart  0-20 Units Subcutaneous Q4H   insulin aspart  5 Units Subcutaneous Q4H   multivitamin  1 tablet Per Tube QHS   mouth rinse  15 mL Mouth Rinse Q2H   pantoprazole (PROTONIX) IV  40 mg Intravenous Q24H   polyethylene glycol  17 g Per Tube Daily   sodium chloride flush  10-40 mL Intracatheter Q12H   sodium chloride flush  3 mL Intravenous Q12H    BMET    Component Value Date/Time   NA 138 05/10/2022 0403   NA 139 07/02/2014 1923   K 4.0 05/10/2022 0403   K 3.9 07/02/2014 1923   CL 107 05/10/2022 0403   CL 105 07/02/2014 1923   CO2 25 05/10/2022 0403   CO2 26 07/02/2014 1923   GLUCOSE 144 (H) 05/10/2022 0403   GLUCOSE 95 07/02/2014 1923   BUN 51 (H) 05/10/2022 0403   BUN 7 07/02/2014 1923   CREATININE 2.22 (H) 05/10/2022 0403   CREATININE 0.98 07/02/2014 1923   CALCIUM 7.2 (L) 05/10/2022 0403   CALCIUM 8.5 07/02/2014 1923   GFRNONAA 26 (L) 05/10/2022 0403   GFRNONAA >60 07/02/2014 1923   GFRAA >60 04/22/2018 2215   GFRAA >60 07/02/2014 1923   CBC    Component Value Date/Time   WBC 23.5 (H) 05/10/2022 0719   RBC 2.73 (L) 05/10/2022 0719   HGB 7.4 (L) 05/10/2022 0719   HGB 9.4 (L) 07/02/2014 1923   HCT 22.6 (L) 05/10/2022 0719   HCT 30.3 (L) 07/02/2014 1923   PLT 182 05/10/2022 0719   PLT 449 (H) 07/02/2014 1923   MCV 82.8 05/10/2022 0719   MCV 71 (L) 07/02/2014 1923   MCH 27.1 05/10/2022 0719   MCHC 32.7 05/10/2022 0719   RDW 16.7 (H) 05/10/2022 0719   RDW 18.8 (H) 07/02/2014 1923   LYMPHSABS 2.0 05/07/2022 0428   MONOABS 2.4 (H) 05/07/2022 0428   EOSABS 0.0 05/07/2022 0428   BASOSABS 0.1 05/07/2022 0428    Assessment/Plan:   AKI - likely ATN in the setting of cardiogenic and septic shock.  Started on CRRT 05/01/22 after RIJ temp HD  catheter placed.  Off pressors and would like to attempt IHD, however large intake each day (over 3 liters).  Ok to try high dose lasix challenge and see if her UOP improves.  If we can concentrate her IVF's, we may be able to transition to IHD this weekend. All fluids 4K/2.5Ca, pre and post-filter rates at 400 mL/hr, dialysate rate 1500 mL/hr, no anticoagulation due to thrombocytopenia.  UF goal for CVP of 7-9 mmHg.  To keep even again today due to low CVP Remains anuric, continue with CRRT as ordered. Cardiogenic/septic shock - weaned off levophed, on dobutamine.  Heart failure team following. Likely due to aspiration pneumonia. Acute hypoxic respiratory failure - was re-intubated 05/05/22.  Vent per PCCM and possible trach. Infectious enteritis - seen on CT scan.  WBC continues to climb.  Plan per PCCM. Anemia of critical illness - transfuse for Hgb <7. Acute MI - normal coronaries by cath [facility] - improved with CRRT. Thrombocytopenia - improving.  S/p robotic incisional hernia repair with mesh and LOA - Surgery following.   Broadus John  Denice Paradise, MD [facility]

## 2022-05-10 NOTE — Plan of Care (Signed)
  Problem: Nutrition: Goal: Adequate nutrition will be maintained Outcome: Progressing   Problem: Coping: Goal: Level of anxiety will decrease Outcome: Progressing   Problem: Elimination: Goal: Will not experience complications related to bowel motility Outcome: Progressing   Problem: Pain Managment: Goal: General experience of comfort will improve Outcome: Progressing   Problem: Fluid Volume: Goal: Hemodynamic stability will improve Outcome: Progressing

## 2022-05-10 NOTE — Progress Notes (Signed)
PHARMACY - TOTAL PARENTERAL NUTRITION CONSULT NOTE  Indication: Small bowel obstruction and ileus   Patient Measurements: Height: '5\' 4"'  (162.6 cm) Weight: 108.7 kg (239 lb 10.2 oz) IBW/kg (Calculated) : 54.7 TPN AdjBW (KG): 68.2 Body mass index is 41.13 kg/m.  Assessment:  51 yo female s/p difficult incisional hernia repair with long LoA on 9/26.  Patient developed a SBO based on CT scan 10/2.  Poor PO intake since surgery due to abdominal pain.  Pharmacy consulted to manage TPN.  Patient was extubated and then re-intubated on 10/8 due to respiratory distress. Initiated on TF on 10/9.  Glucose / Insulin: A1c 6.3% - CBGs 170-210s Used 29 units rSSI (up), Novolog 7 units Q4H, 55 units in TPN Electrolytes: coCa 9.6, Mag 4.2 (none in TPN), others WNL Renal: CRRT started 10/4, BUN 51 Hepatic: AST/ALT up 335/324, alk phos up to 216, tbili 1.6, TG 180 Intake / Output; MIVF: anuric, NG 8m, stool 2344m CRRT 3.5L, net +16.6L GI Imaging: 10/3 CT: continued SBO  10/2 CT: SBO 10/9 CT: infectious or inflammatory enteritis GI Surgeries / Procedures: none since TPN initiation   Central access: CVC triple lumen placed 05/04/22 TPN start date: 04/29/22  Nutritional Goals: RD Estimated Needs Total Energy Estimated Needs: 1800-2000 kcal/d Total Protein Estimated Needs: 110-130 grams Total Fluid Estimated Needs: 1.8-2L/d  Current Nutrition:  TPN TF (Vital 1.5) @ 4066mr (65g protein, 1440 kcal/24hrs) (goal rate 65m43m)  Plan:  Concentrated TPN at 35 ml/hr, meeting 50% of needs.  Lipids at 20% of kCal (added back 10/6). Electrolytes in TPN: Na 154mE49m K 0mEq/8mCa 12mEq/59mg 0mEq/L,85mos 25mmol/L43m:Ac 1:1 Add standard MVI and trace elements to TPN, chromium removed for CRRT  Continue to replace K outside of TPN as needed (none necessary today) Continue resistant SSI Q4H and decrease regular insulin to 25 units in TPN  Reducing Novolog 5 units Q4H per CCM Renal function panel BID and  daily Mag while on CRRT per Renal TG level weekly on Mondays Plan to continue TPN until pt tolerating goal TFs given previous GI complications during hospitalization - f/u TF toleration 10/14   Mariesha Venturella Wilson SingerClinical Pharmacist 05/10/2022 8:03 AM

## 2022-05-10 NOTE — Procedures (Signed)
Cortrak  Person Inserting Tube:  Maylon Peppers C, RD Tube Type:  Cortrak - 43 inches Tube Size:  10 Tube Location:  Left nare Secured by: Bridle Technique Used to Measure Tube Placement:  Marking at nare/corner of mouth Cortrak Secured At:  89 cm   Cortrak Tube Team Note:  Consult received to place a Cortrak feeding tube.   X-ray is required, abdominal x-ray has been ordered by the Cortrak team. Please confirm tube placement before using the Cortrak tube.   If the tube becomes dislodged please keep the tube and contact the Cortrak team at [email or web address] for replacement.  If after hours and replacement cannot be delayed, place a NG tube and confirm placement with an abdominal x-ray.    Lockie Pares., RD, LDN, CNSC See AMiON for contact information

## 2022-05-10 NOTE — Progress Notes (Signed)
NAME:  Patricia [name], MRN:  [id number], DOB:  1971-04-06, LOS: 73 ADMISSION DATE:  04/23/2022, CONSULTATION DATE:  04/29/2022 REFERRING MD:  Patricia Morn, MD, CHIEF COMPLAINT:   Small bowel obstruction, aspiration event, sepsis, respiratory failure    History of Present Illness:  Ms. Patricia [name] with above hx and nuc study in 2015 neg for ischemia done for chest pain and echo with EF 60-65% presented to hospital for excisional hernia repair (robotic hernia repair 04/23/22) with mesh, ileus post op on TNA. Developed vomiting and hypotension after NG came out on 04/29/22 and PNA on CXR, was tachycardic and BP improved with IV fluids. DX with sepsis and ABX started. At one point prolonged Qtc. Pt admitted to ICU on 10/2.  Was placed on levophed. Early AM on 10/3 was intubated for worsening metabolic/lactic acidosis. She had AKI as well. Crt was 6.01, her normal 0.9. Today Cr is 4 on CRRT.  Pertinent  Medical History  DM2 Depression OSA Migraines  Asthma Obesity Schizophrenia HTN HLD  Significant Hospital Events: Including procedures, antibiotic start and stop dates in addition to other pertinent events   9/26 Robotic hernia repair complicated by abd adhesions.  10/2 CT + for developing SBO and bilat lower lobe opacities most likely aspiration PNA. Progressive decompensation requiring transfer to ICU 10/2 PCCM consulted for respiratory failure & shock 10\3 Intubated for resp failure. CVL and arterial line placed. 04/30/22: EF 30-35%, RV moderately reduced, mild to moderate MR, IVC collapsed throughout respiratory cycle 10\4 EKG changes/trop increased--> and taken to cath lab - No obstructive CAD, but in decompensated heart failure, Bedside echo with EF 15%, new WMA, moderate RV dysfunction, trivial pericardial effusion.underwent emergent L/RHC. Normal coronaries, RA mean 13, PA mean 29, LVEDP 16, PCWP 19, CO/CI 3.96/1.87   returned to CICU post-cath. Right heart cath placed right fem.  Right IJ HD cath placed.  10/5 advanced HF team following, shock supported on norepi/vasopressin and Dobutamine. On CRRT.  10/6 weaning Norepi. Starting to wean fent. Needing restraints.  10/8: Reintubated due to neuromuscular weakness.  Bedside echo demonstrating improved RV function, LV function remained depressed 10/9 CT abdomen pelvis.suggesting wall thickening throughout the proximal and mid small bowel consistent with possible infectious or inflammatory enteritis there is also bibasilar airspace disease which could reflect pneumonia.  Antibiotic coverage widened.  Zosyn changed to meropenem.  Tolerating trickle feeds still on TPN 10/10 stopped stress dose steroids.  Resumed subcutaneous heparin. 10/13 albumin for coox decrease. Weaning vent   Interim History / Subjective:  NAEO  On low dose fent dex this morning Tolerating SBT well   Getting albumin for low coox  Off pressors   WBC down to 23.5 today   Objective   Blood pressure (!) 145/73, pulse (!) 103, temperature 99 F (37.2 C), resp. rate (!) 26, height '5\' 4"'$  (1.626 m), weight 108.7 kg, last menstrual period 07/07/2014, SpO2 100 %. CVP:  [1 mmHg-14 mmHg] 6 mmHg  Vent Mode: PSV;CPAP FiO2 (%):  [30 %] 30 % Set Rate:  [20 bmp] 20 bmp Vt Set:  [460 mL] 460 mL PEEP:  [5 cmH20] 5 cmH20 Pressure Support:  [5 cmH20] 5 cmH20   Intake/Output Summary (Last 24 hours) at 05/10/2022 1033 Last data filed at 05/10/2022 0900 Gross per 24 hour  Intake 4294.76 ml  Output 4378 ml  Net -83.24 ml   Filed Weights   05/08/22 0413 05/09/22 0202 05/10/22 0429  Weight: 112.7 kg 109.7 kg 108.7 kg    Examination:  General: Critically and chronically ill middle aged F intubated lightly sedated NAD  HEENT NCAT pink mm ETT secure  Pulmonary: Even unlabored on PSV. S;ightly incr RR ii Cardiac: rrr s1s2 cap refill < 3sec Abdomen: obese soft ndnt  Extremities: extremity edema no acute joint deformity  Neuro: Awake, lethargic following  commands. Generalized weakness   Resolved problems:   Assessment & Plan:   Acute metabolic encephalopathy superimposed on hx bipolar disorder, schizophrenia   Possible ICU delirium -not on PTA bipolar meds  -minimize sedation, RASS 0 goal -delirium precuations    Acute biventricular HFrEF (? Etiology sepsis, stress induced CM. Had clean coronaries) Improved shock  -daily cooximetry -getting Albumin 10/13 for low coox -repeat coox after albumin, might need to restart dobumatine  -CRRT for volume control   Acute hypoxic respiratory failure Aspiration PNA Pulm edema   RLL VAP  - -WUA/SBT -- possible extubation trial 10/13 -if fails extubation attempt, would need trach (failed 10/8 extubation attempt) -VAP, pulm hygiene  AKI with oliguria AGMA -CRRT per nephrology - will do lasix trial once off RRT (probably will be after extubation attempt). Might need foley when we do a lasix challenge  -strict I/O -renally dose meds, avoid nephrotoxic meds  S/p hernia repair S/p LOA Ileus  ?inflammatory enteritis  -con't TF at 40cc/h; adding prosource. If she continues to tolerate TF will advance to goal tomorrow. Hopefully we can come off TPN over the weekend. -appreciate surgery's management -mero as below   Suspected sepsis -- inflammatory enteritis, PPNA Initially thought leukocytosis secondary to steroids, however CT imaging suggesting wall thickening throughout the proximal and mid small bowel --? possible infectious or inflammatory enteritis, as well as ? Basilar PNA  -Cont mero. Total duration not yet determined. At least 7-10 d  Anemia, acute on chronic  -chronic kidney disease, critical illness  -monitor for bleeding -transfuse for Hb <7 or hemodynamically significant bleeding  DM2 with hyperglycemia  -TPN with insulin -SSI PRN -increase TF coverage 8 units Q4h with hold parameters -goal BG 140-180  Severe protein calorie malnutrition  -TF & TPN -hopefully can  advance TF and d/c TPN over the weekend   Best Practice (right click and "Reselect all SmartList Selections" daily)   Diet/type: tubefeeds and TPN DVT prophylaxis: prophylactic heparin  GI prophylaxis: PPI Lines: Central line and Dialysis Catheter Foley:  Yes, and it is still needed Code Status:  full code  CRITICAL CARE Performed by: Cristal Generous   Total critical care time: 38 minutes  Critical care time was exclusive of separately billable procedures and treating other patients. Critical care was necessary to treat or prevent imminent or life-threatening deterioration.  Critical care was time spent personally by me on the following activities: development of treatment plan with patient and/or surrogate as well as nursing, discussions with consultants, evaluation of patient's response to treatment, examination of patient, obtaining history from patient or surrogate, ordering and performing treatments and interventions, ordering and review of laboratory studies, ordering and review of radiographic studies, pulse oximetry and re-evaluation of patient's condition.  Eliseo Gum MSN, AGACNP-BC [place] for pager  05/10/2022, 10:33 AM

## 2022-05-10 NOTE — Progress Notes (Addendum)
Advanced Heart Failure Rounding Note  PCP-Cardiologist: None   Subjective:    - Remains intubated. Anuric on CRRT, currently pulling 100/hr. CVP ~5 overnight. MAPs low 80s. No current pressor requirements.  - Dobutamine discontinued 10/11. Co-ox down, 39%>> 47% on repeat.   Febrile yesterday, mTemp 101.9. On Vanc + meropenum. WBC 33>>27>>pending. Fever curve down trending, 99 overnight. Cultures negative     Objective:   Weight Range: 108.7 kg Body mass index is 41.13 kg/m.   Vital Signs:   Temp:  [98.6 F (37 C)-101.9 F (38.8 C)] 98.8 F (37.1 C) (10/13 0700) Pulse Rate:  [88-112] 101 (10/13 0700) Resp:  [14-41] 26 (10/13 0700) BP: (117-135)/(63-69) 135/69 (10/12 1550) SpO2:  [97 %-100 %] 100 % (10/13 0700) Arterial Line BP: (98-147)/(49-80) 128/68 (10/13 0700) FiO2 (%):  [30 %-40 %] 30 % (10/13 0400) Weight:  [108.7 kg] 108.7 kg (10/13 0429) Last BM Date : 05/09/22 (stool in rectal tube)  Weight change: Filed Weights   05/08/22 0413 05/09/22 0202 05/10/22 0429  Weight: 112.7 kg 109.7 kg 108.7 kg    Intake/Output:   Intake/Output Summary (Last 24 hours) at 05/10/2022 0716 Last data filed at 05/10/2022 0700 Gross per 24 hour  Intake 4438.58 ml  Output 4773 ml  Net -334.42 ml      Physical Exam   CVP 6  General:  intubated and sedated HEENT: normal + ETT  Neck: supple. Thick neck, JVD not well visualized. Carotids 2+ bilat; no bruits. No lymphadenopathy or thyromegaly appreciated. Cor: PMI nondisplaced. Regular rate & rhythm. No rubs, gallops or murmurs. Lungs: intubated and clear Abdomen: obese, soft, nontender, nondistended. No hepatosplenomegaly. No bruits or masses. Good bowel sounds. Extremities: no cyanosis, clubbing, rash, 1+ b/l LE edema Neuro: intubated and sedated   Telemetry   SR-ST 80-100s   Labs    CBC Recent Labs    05/08/22 0426 05/09/22 0422  WBC 33.0* 27.9*  HGB 7.9* 7.8*  HCT 23.4* 23.8*  MCV 80.4 82.9  PLT 144* 283    Basic Metabolic Panel Recent Labs    05/09/22 0422 05/09/22 1549 05/10/22 0403  NA 140 138 138  K 4.2 4.3 4.0  CL 103 106 107  CO2 '23 23 25  '$ GLUCOSE 196* 139* 144*  BUN 43* 47* 51*  CREATININE 1.96* 2.15* 2.22*  CALCIUM 7.6* 7.1* 7.2*  MG 2.5*  --  2.7*  PHOS 4.3 4.0 4.2   Liver Function Tests  Imaging   Pertinent imaging reviewed;  Echo 05/02/22: LVEF 20%, RV function moderately reduced.   Medications:     Scheduled Medications:  Chlorhexidine Gluconate Cloth  6 each Topical Daily   feeding supplement (PROSource TF20)  60 mL Per Tube BID   heparin injection (subcutaneous)  5,000 Units Subcutaneous Q8H   insulin aspart  0-20 Units Subcutaneous Q4H   insulin aspart  7 Units Subcutaneous Q4H   multivitamin  1 tablet Per Tube QHS   mouth rinse  15 mL Mouth Rinse Q2H   pantoprazole (PROTONIX) IV  40 mg Intravenous Q24H   polyethylene glycol  17 g Per Tube Daily   sodium chloride flush  10-40 mL Intracatheter Q12H   sodium chloride flush  3 mL Intravenous Q12H    Infusions:   prismasol BGK 4/2.5 400 mL/hr at 05/09/22 2321    prismasol BGK 4/2.5 400 mL/hr at 05/09/22 2333   sodium chloride     sodium chloride Stopped (05/04/22 2003)   sodium chloride 10 mL/hr at  05/10/22 0700   sodium chloride Stopped (05/09/22 1901)   sodium chloride     dexmedetomidine (PRECEDEX) IV infusion 0.5 mcg/kg/hr (05/10/22 0700)   feeding supplement (VITAL 1.5 CAL) 50 mL/hr at 05/10/22 0700   fentaNYL infusion INTRAVENOUS 100 mcg/hr (05/10/22 0700)   furosemide     meropenem (MERREM) IV Stopped (05/10/22 6389)   norepinephrine (LEVOPHED) Adult infusion 0 mcg/min (05/05/22 1853)   prismasol BGK 4/2.5 1,500 mL/hr at 05/10/22 0444   TPN ADULT (ION) 70 mL/hr at 05/10/22 0700    PRN Medications: sodium chloride, sodium chloride, acetaminophen, alum & mag hydroxide-simeth, fentaNYL, fentaNYL (SUBLIMAZE) injection, fentaNYL (SUBLIMAZE) injection, heparin, HYDROmorphone (DILAUDID)  injection, ondansetron (ZOFRAN) IV, mouth rinse, sodium chloride, sodium chloride flush, sodium chloride flush    Patient Profile  51 y.o. AAF with history of bipolar disorder/schizophrenia, DM II, obesity, prior abdominal surgeries. Underwent robotic incisional hernia repair on 09/26.    Course c/b post-op ilieus/obstruction followed by septic shock 2/2 aspiration PNA, AKI on CRRT and cardiogenic shock.  Assessment/Plan  Cardiogenic & Septic shock -Septic shock 2/2 aspiration PNA. Procal > 150 -Taken emergently to cath lab 10/4 d/t concern for ST elevation MI. HS troponin > 24K. Coronaries clean. CI 1.87, RA mean 13, PCWP 19, LVEDP 16.  - WBC trending down -->28K-->pending: possibly 2/2 steroids.   - Blood cultures 10/3 negative - On vancomycin and meropenum per CCM. - Off DBA, Co-ox 39%>>47% on repeat - Suspect low co-ox 2/2 low volume in setting of RV dysfunction, suspect  pre-load dependent.  - Give volume first, albumin, then reassess co-ox. If still low, < 55%, will restart low dose DBA - recheck 2D echo to reassess RV   2. Acute systolic CHF -Nonischemic, no CAD on LHC. -CO-OX 47%. Reassess co-ox after albumin bolus. If remains low, restart DBA 2.5  -Repeat 2D echo to re-assess RV function.  - CVP 6. On CRRT, keep even    3. Acute MI (resolved) -ST elevation in inferolateral leads on ECG  -HS troponin > 24K at peak -Cath with normal coronaries   4. Metabolic acidosis: -Improved; continue CRRT   5. AKI: -Likely ATN in setting of septic shock -Remains anuric  Continue CRRT. Nephrology following  6. Acute hypoxic respiratory failure: -2/2 aspiration PNA -Vent and abx management per CCM   7. Incisional hernia: -s/p robotic repair with mesh/lysis of adhesions this admit -GSU following   8. SBO -Post-op -Receiving TPN - Loose stools; stopping stool softners.   9. Thrombocytopenia  - improving, HIT negative, SRA negative.   Length of Stay: 16 Advanced Heart  Failure Team Pager [phone] (M-F; 7a - 5p)  Please contact [facility] for night-coverage after hours (4p -7a ) and weekends on [email or web address]   Lyda Jester, PA-C  7:16 AM

## 2022-05-10 NOTE — Progress Notes (Signed)
Subjective/Chief Complaint: Tolerating tube feeds.  Having bowel movements   Objective: Vital signs in last 24 hours: Temp:  [97.5 F (36.4 C)-100.2 F (37.9 C)] 99.9 F (37.7 C) (10/13 2000) Pulse Rate:  [85-115] 104 (10/13 2010) Resp:  [14-39] 22 (10/13 2010) BP: (110-145)/(57-73) 110/57 (10/13 1523) SpO2:  [100 %] 100 % (10/13 2010) Arterial Line BP: (96-149)/(49-78) 136/73 (10/13 2010) FiO2 (%):  [30 %] 30 % (10/13 1929) Weight:  [108.7 kg] 108.7 kg (10/13 0429) Last BM Date : 05/10/22  Intake/Output from previous day: 10/12 0701 - 10/13 0700 In: 4438.6 [I.V.:2787.7; NG/GT:1320.5; IV Piggyback:300.4] Out: 4773 [Stool:1660] Intake/Output this shift: Total I/O In: 80 [NG/GT:80] Out: -   General: intubated, sedated, opens eyes CV: RRR HEENT: NG in place with feeds; ETT Abdomen: soft, nondistended, incisions clean and dry with no erythema or induration.   Lab Results:  Recent Labs    05/09/22 0422 05/10/22 0719 05/10/22 1316  WBC 27.9* 23.5*  --   HGB 7.8* 7.4* 7.0*  HCT 23.8* 22.6* 21.5*  PLT 153 182  --     BMET Recent Labs    05/10/22 0403 05/10/22 1654  NA 138 139  K 4.0 4.3  CL 107 107  CO2 25 22  GLUCOSE 144* 148*  BUN 51* 51*  CREATININE 2.22* 2.15*  CALCIUM 7.2* 7.7*    PT/INR No results for input(s): "LABPROT", "INR" in the last 72 hours. ABG Recent Labs    05/07/22 2146  PHART 7.506*  HCO3 23.7     Studies/Results: DG Abd Portable 1V  Result Date: 05/10/2022 CLINICAL DATA:  Encounter for feeding tube placement. EXAM: PORTABLE ABDOMEN - 1 VIEW COMPARISON:  CT 05/06/2022 FINDINGS: Weighted enteric tube courses along the expected location of the stomach in the duodenum. The tip is in the left upper quadrant in the region of the proximal jejunum. Paucity of upper abdominal bowel gas IMPRESSION: Weighted enteric tube with tip in the left upper quadrant in the region of the proximal jejunum. Electronically Signed   By: Keith Rake M.D.   On: 05/10/2022 14:50   ECHOCARDIOGRAM COMPLETE  Result Date: 05/10/2022    ECHOCARDIOGRAM REPORT   Patient Name:   Patricia [name] [facility] Date of Exam: 05/10/2022 Medical Rec #:  [id number]          Height:       64.0 in Accession #:    [id number]         Weight:       239.6 lb Date of Birth:  July 14, 1971          BSA:          2.113 m Patient Age:    51 years           BP:           145/73 mmHg Patient Gender: F                  HR:           93 bpm. Exam Location:  Inpatient Procedure: 2D Echo, Cardiac Doppler, Color Doppler and Intracardiac            Opacification Agent Indications:    CHF  History:        Patient has prior history of Echocardiogram examinations, most                 recent 05/02/2022. Arrythmias:Tachycardia; Risk  Factors:Hypertension and Diabetes. Abnormal ECG. Bacteremia.  Sonographer:    Clayton Lefort RDCS (AE) Referring Phys: [phone] AMY D CLEGG  Sonographer Comments: Technically difficult study due to poor echo windows, echo performed with patient supine and on artificial respirator and patient is obese. Image acquisition challenging due to patient body habitus and Image acquisition challenging due to respiratory motion. IMPRESSIONS  1. Study not well visualized and limited.. Left ventricular ejection fraction, by estimation, is 50 to 55%. The left ventricle has low normal function. The left ventricle has no regional wall motion abnormalities. Left ventricular diastolic parameters are consistent with Grade I diastolic dysfunction (impaired relaxation).  2. Right ventricular systolic function is normal. The right ventricular size is normal.  3. No evidence of mitral valve regurgitation.  4. Aortic valve regurgitation is not visualized.  5. The inferior vena cava is normal in size with greater than 50% respiratory variability, suggesting right atrial pressure of 3 mmHg. Conclusion(s)/Recommendation(s): Study is not well visualized. EF appears improved from prior study  05/02/2022. FINDINGS  Left Ventricle: Study not well visualized and limited. Left ventricular ejection fraction, by estimation, is 50 to 55%. The left ventricle has low normal function. The left ventricle has no regional wall motion abnormalities. Definity contrast agent was  given IV to delineate the left ventricular endocardial borders. The left ventricular internal cavity size was normal in size. There is borderline left ventricular hypertrophy. Left ventricular diastolic parameters are consistent with Grade I diastolic dysfunction (impaired relaxation). Right Ventricle: The right ventricular size is normal. Right ventricular systolic function is normal. Left Atrium: Left atrial size was normal in size. Right Atrium: Right atrial size was normal in size. Pericardium: There is no evidence of pericardial effusion. Mitral Valve: No evidence of mitral valve regurgitation. Aortic Valve: Aortic valve regurgitation is not visualized. Aortic valve mean gradient measures 7.0 mmHg. Aortic valve peak gradient measures 12.8 mmHg. Aortic valve area, by VTI measures 3.38 cm. Aorta: The aortic root is normal in size and structure. Venous: The inferior vena cava is normal in size with greater than 50% respiratory variability, suggesting right atrial pressure of 3 mmHg.  LEFT VENTRICLE PLAX 2D LVIDd:         4.40 cm   Diastology LVIDs:         3.60 cm   LV e' medial:    6.74 cm/s LV PW:         1.10 cm   LV E/e' medial:  11.7 LV IVS:        1.00 cm   LV e' lateral:   12.20 cm/s LVOT diam:     2.40 cm   LV E/e' lateral: 6.4 LV SV:         87 LV SV Index:   41 LVOT Area:     4.52 cm  RIGHT VENTRICLE RV Basal diam:  2.40 cm RV S prime:     18.60 cm/s TAPSE (M-mode): 2.1 cm LEFT ATRIUM             Index        RIGHT ATRIUM          Index LA diam:        3.20 cm 1.51 cm/m   RA Area:     9.71 cm LA Vol (A2C):   49.1 ml 23.24 ml/m  RA Volume:   18.00 ml 8.52 ml/m LA Vol (A4C):   35.4 ml 16.76 ml/m LA Biplane Vol: 41.9 ml 19.83  ml/m  AORTIC VALVE  AV Area (Vmax):    3.44 cm AV Area (Vmean):   3.32 cm AV Area (VTI):     3.38 cm AV Vmax:           179.00 cm/s AV Vmean:          118.000 cm/s AV VTI:            0.258 m AV Peak Grad:      12.8 mmHg AV Mean Grad:      7.0 mmHg LVOT Vmax:         136.00 cm/s LVOT Vmean:        86.500 cm/s LVOT VTI:          0.193 m LVOT/AV VTI ratio: 0.75  AORTA Ao Root diam: 3.80 cm MITRAL VALVE MV Area (PHT): 2.61 cm    SHUNTS MV Decel Time: 291 msec    Systemic VTI:  0.19 m MV E velocity: 78.60 cm/s  Systemic Diam: 2.40 cm MV A velocity: 82.80 cm/s MV E/A ratio:  0.95 Mary Scientist, physiological signed by Phineas Inches Signature Date/Time: 05/10/2022/9:28:37 AM    Final     Anti-infectives: Anti-infectives (From admission, onward)    Start     Dose/Rate Route Frequency Ordered Stop   05/06/22 1530  meropenem (MERREM) 1 g in sodium chloride 0.9 % 100 mL IVPB        1 g 200 mL/hr over 30 Minutes Intravenous Every 8 hours 05/06/22 1438     05/04/22 1000  vancomycin (VANCOCIN) IVPB 1000 mg/200 mL premix  Status:  Discontinued        1,000 mg 200 mL/hr over 60 Minutes Intravenous Every 24 hours 05/03/22 0802 05/08/22 0913   05/03/22 0845  vancomycin (VANCOREADY) IVPB 2000 mg/400 mL        2,000 mg 200 mL/hr over 120 Minutes Intravenous  Once 05/03/22 0751 05/03/22 1114   05/01/22 1800  piperacillin-tazobactam (ZOSYN) IVPB 3.375 g  Status:  Discontinued        3.375 g 12.5 mL/hr over 240 Minutes Intravenous Every 6 hours 05/01/22 1426 05/01/22 1427   05/01/22 1515  piperacillin-tazobactam (ZOSYN) IVPB 3.375 g  Status:  Discontinued        3.375 g 100 mL/hr over 30 Minutes Intravenous Every 6 hours 05/01/22 1428 05/06/22 1438   04/30/22 1400  piperacillin-tazobactam (ZOSYN) IVPB 2.25 g  Status:  Discontinued        2.25 g 100 mL/hr over 30 Minutes Intravenous Every 8 hours 04/30/22 1325 05/01/22 1426   04/29/22 1530  piperacillin-tazobactam (ZOSYN) IVPB 3.375 g  Status:  Discontinued         3.375 g 12.5 mL/hr over 240 Minutes Intravenous Every 8 hours 04/29/22 1433 04/30/22 1325   04/29/22 1145  metroNIDAZOLE (FLAGYL) IVPB 500 mg  Status:  Discontinued        500 mg 100 mL/hr over 60 Minutes Intravenous Every 12 hours 04/29/22 1047 04/29/22 1433   04/29/22 1145  vancomycin (VANCOCIN) IVPB 1000 mg/200 mL premix  Status:  Discontinued        1,000 mg 200 mL/hr over 60 Minutes Intravenous  Once 04/29/22 1047 04/29/22 1055   04/29/22 1145  vancomycin (VANCOREADY) IVPB 1500 mg/300 mL  Status:  Discontinued        1,500 mg 150 mL/hr over 120 Minutes Intravenous Every 24 hours 04/29/22 1055 04/29/22 1357   04/29/22 1045  ceFEPIme (MAXIPIME) 2 g in sodium chloride 0.9 % 100 mL IVPB  Status:  Discontinued  2 g 200 mL/hr over 30 Minutes Intravenous Every 8 hours 04/29/22 0954 04/29/22 1433   04/23/22 0730  ceFAZolin (ANCEF) IVPB 2g/100 mL premix        2 g 200 mL/hr over 30 Minutes Intravenous On call to O.R. 04/23/22 0726 04/23/22 9774       Assessment/Plan: Ms. Spath is s/p robotic incisional hernia repair on 04/23/22 -Postop ileus complicated by aspiration: Ileus resolved, having bowel function.  -CT abd/pelvis reviewed. Proximal small bowel is thickened but there is no pneumatosis or portal venous gas to suggest ischemia. I wonder if this is a post-ischemic thickening of the small intestine that will improve with time. - Okay for tube feeds as tolerated and weaning TPN off  Cardiogenic/septic shock -Antibiotics per CCM -cath 10/4 - no obstructive CAD  AKI -CRRT  Resp failure -Vent management per CCM. Reasonable to proceed with tracheostomy if deemed necessary by critical care team.   Felicie Morn, MD General, Bariatric and Minimally Invasive Surgery [facility]    05/10/22 9:41 PM

## 2022-05-11 ENCOUNTER — Inpatient Hospital Stay (HOSPITAL_COMMUNITY): Payer: Medicaid Other

## 2022-05-11 DIAGNOSIS — A419 Sepsis, unspecified organism: Secondary | ICD-10-CM | POA: Diagnosis not present

## 2022-05-11 DIAGNOSIS — R6521 Severe sepsis with septic shock: Secondary | ICD-10-CM | POA: Diagnosis not present

## 2022-05-11 DIAGNOSIS — R570 Cardiogenic shock: Secondary | ICD-10-CM | POA: Diagnosis not present

## 2022-05-11 DIAGNOSIS — Z9911 Dependence on respirator [ventilator] status: Secondary | ICD-10-CM | POA: Diagnosis not present

## 2022-05-11 DIAGNOSIS — J9601 Acute respiratory failure with hypoxia: Secondary | ICD-10-CM | POA: Diagnosis not present

## 2022-05-11 DIAGNOSIS — E43 Unspecified severe protein-calorie malnutrition: Secondary | ICD-10-CM | POA: Diagnosis not present

## 2022-05-11 LAB — POCT I-STAT 7, (LYTES, BLD GAS, ICA,H+H)
Acid-Base Excess: 0 mmol/L (ref 0.0–2.0)
Bicarbonate: 23.8 mmol/L (ref 20.0–28.0)
Calcium, Ion: 1.17 mmol/L (ref 1.15–1.40)
HCT: 21 % — ABNORMAL LOW (ref 36.0–46.0)
Hemoglobin: 7.1 g/dL — ABNORMAL LOW (ref 12.0–15.0)
O2 Saturation: 98 %
Patient temperature: 98.1
Potassium: 4.7 mmol/L (ref 3.5–5.1)
Sodium: 138 mmol/L (ref 135–145)
TCO2: 25 mmol/L (ref 22–32)
pCO2 arterial: 35.4 mmHg (ref 32–48)
pH, Arterial: 7.434 (ref 7.35–7.45)
pO2, Arterial: 98 mmHg (ref 83–108)

## 2022-05-11 LAB — GLUCOSE, CAPILLARY
Glucose-Capillary: 111 mg/dL — ABNORMAL HIGH (ref 70–99)
Glucose-Capillary: 131 mg/dL — ABNORMAL HIGH (ref 70–99)
Glucose-Capillary: 135 mg/dL — ABNORMAL HIGH (ref 70–99)
Glucose-Capillary: 140 mg/dL — ABNORMAL HIGH (ref 70–99)
Glucose-Capillary: 152 mg/dL — ABNORMAL HIGH (ref 70–99)

## 2022-05-11 LAB — CBC
HCT: 21.6 % — ABNORMAL LOW (ref 36.0–46.0)
Hemoglobin: 7 g/dL — ABNORMAL LOW (ref 12.0–15.0)
MCH: 27.1 pg (ref 26.0–34.0)
MCHC: 32.4 g/dL (ref 30.0–36.0)
MCV: 83.7 fL (ref 80.0–100.0)
Platelets: 173 10*3/uL (ref 150–400)
RBC: 2.58 MIL/uL — ABNORMAL LOW (ref 3.87–5.11)
RDW: 17 % — ABNORMAL HIGH (ref 11.5–15.5)
WBC: 21 10*3/uL — ABNORMAL HIGH (ref 4.0–10.5)
nRBC: 0.4 % — ABNORMAL HIGH (ref 0.0–0.2)

## 2022-05-11 LAB — COOXEMETRY PANEL
Carboxyhemoglobin: 1.7 % — ABNORMAL HIGH (ref 0.5–1.5)
Methemoglobin: 0.9 % (ref 0.0–1.5)
O2 Saturation: 50.9 %
Total hemoglobin: 7.2 g/dL — ABNORMAL LOW (ref 12.0–16.0)

## 2022-05-11 LAB — RENAL FUNCTION PANEL
Albumin: 1.6 g/dL — ABNORMAL LOW (ref 3.5–5.0)
Albumin: 1.6 g/dL — ABNORMAL LOW (ref 3.5–5.0)
Anion gap: 9 (ref 5–15)
Anion gap: 8 (ref 5–15)
BUN: 50 mg/dL — ABNORMAL HIGH (ref 6–20)
BUN: 49 mg/dL — ABNORMAL HIGH (ref 6–20)
CO2: 24 mmol/L (ref 22–32)
CO2: 24 mmol/L (ref 22–32)
Calcium: 7.8 mg/dL — ABNORMAL LOW (ref 8.9–10.3)
Calcium: 7.7 mg/dL — ABNORMAL LOW (ref 8.9–10.3)
Chloride: 105 mmol/L (ref 98–111)
Chloride: 106 mmol/L (ref 98–111)
Creatinine, Ser: 2.12 mg/dL — ABNORMAL HIGH (ref 0.44–1.00)
Creatinine, Ser: 2.18 mg/dL — ABNORMAL HIGH (ref 0.44–1.00)
GFR, Estimated: 28 mL/min — ABNORMAL LOW (ref >60)
GFR, Estimated: 27 mL/min — ABNORMAL LOW (ref >60)
Glucose, Bld: 142 mg/dL — ABNORMAL HIGH (ref 70–99)
Glucose, Bld: 132 mg/dL — ABNORMAL HIGH (ref 70–99)
Phosphorus: 4.5 mg/dL (ref 2.5–4.6)
Phosphorus: 4.9 mg/dL — ABNORMAL HIGH (ref 2.5–4.6)
Potassium: 4.4 mmol/L (ref 3.5–5.1)
Potassium: 4.6 mmol/L (ref 3.5–5.1)
Sodium: 138 mmol/L (ref 135–145)
Sodium: 138 mmol/L (ref 135–145)

## 2022-05-11 LAB — MAGNESIUM: Magnesium: 2.5 mg/dL — ABNORMAL HIGH (ref 1.7–2.4)

## 2022-05-11 LAB — MRSA NEXT GEN BY PCR, NASAL: MRSA by PCR Next Gen: NOT DETECTED

## 2022-05-11 MED ORDER — NOREPINEPHRINE 4 MG/250ML-% IV SOLN
0.0000 ug/min | INTRAVENOUS | Status: DC
  Administered 2022-05-11: 2 ug/min via INTRAVENOUS
  Filled 2022-05-11: qty 250

## 2022-05-11 MED ORDER — LIDOCAINE-EPINEPHRINE (PF) 2 %-1:200000 IJ SOLN
INTRAMUSCULAR | Status: AC
  Filled 2022-05-11: qty 20

## 2022-05-11 MED ORDER — EPINEPHRINE 1 MG/10ML IJ SOSY
PREFILLED_SYRINGE | INTRAMUSCULAR | Status: AC
  Administered 2022-05-11: 1 mg
  Filled 2022-05-11: qty 10

## 2022-05-11 MED ORDER — ROCURONIUM BROMIDE 50 MG/5ML IV SOLN
100.0000 mg | Freq: Once | INTRAVENOUS | Status: AC
  Filled 2022-05-11: qty 10

## 2022-05-11 MED ORDER — MIDAZOLAM HCL 2 MG/2ML IJ SOLN
2.0000 mg | INTRAMUSCULAR | Status: DC | PRN
  Administered 2022-05-11 (×2): 2 mg via INTRAVENOUS
  Filled 2022-05-11 (×2): qty 2

## 2022-05-11 MED ORDER — KETAMINE HCL 50 MG/5ML IJ SOSY
PREFILLED_SYRINGE | INTRAMUSCULAR | Status: AC
  Filled 2022-05-11: qty 5

## 2022-05-11 MED ORDER — MIDAZOLAM HCL 2 MG/2ML IJ SOLN
2.0000 mg | INTRAMUSCULAR | Status: AC
  Administered 2022-05-11 (×4): 2 mg via INTRAVENOUS
  Filled 2022-05-11 (×4): qty 2

## 2022-05-11 MED ORDER — MIDAZOLAM HCL 2 MG/2ML IJ SOLN
2.0000 mg | INTRAMUSCULAR | Status: DC | PRN
  Administered 2022-05-11: 2 mg via INTRAVENOUS
  Filled 2022-05-11: qty 2

## 2022-05-11 MED ORDER — ETOMIDATE 2 MG/ML IV SOLN
INTRAVENOUS | Status: AC
  Administered 2022-05-11: 20 mg via INTRAVENOUS
  Filled 2022-05-11: qty 20

## 2022-05-11 MED ORDER — FENTANYL 2500MCG IN NS 250ML (10MCG/ML) PREMIX INFUSION
50.0000 ug/h | INTRAVENOUS | Status: DC
  Administered 2022-05-11 – 2022-05-12 (×3): 200 ug/h via INTRAVENOUS
  Administered 2022-05-12: 100 ug/h via INTRAVENOUS
  Filled 2022-05-11: qty 250

## 2022-05-11 MED ORDER — FENTANYL CITRATE PF 50 MCG/ML IJ SOSY
PREFILLED_SYRINGE | INTRAMUSCULAR | Status: AC
  Administered 2022-05-11: 100 ug via INTRAVENOUS
  Filled 2022-05-11: qty 2

## 2022-05-11 MED ORDER — MIDAZOLAM HCL 2 MG/2ML IJ SOLN
INTRAMUSCULAR | Status: AC
  Administered 2022-05-11: 2 mg via INTRAVENOUS
  Filled 2022-05-11: qty 2

## 2022-05-11 MED ORDER — ROCURONIUM BROMIDE 10 MG/ML (PF) SYRINGE
PREFILLED_SYRINGE | INTRAVENOUS | Status: AC
  Administered 2022-05-11: 100 mg via INTRAVENOUS
  Filled 2022-05-11: qty 10

## 2022-05-11 MED ORDER — TRACE MINERALS CU-MN-SE-ZN 300-55-60-3000 MCG/ML IV SOLN
INTRAVENOUS | Status: AC
  Filled 2022-05-11: qty 414.4

## 2022-05-11 MED ORDER — FENTANYL CITRATE PF 50 MCG/ML IJ SOSY: 100.0000 ug | PREFILLED_SYRINGE | Freq: Once | INTRAMUSCULAR | Status: AC

## 2022-05-11 MED ORDER — HEPARIN SODIUM (PORCINE) 5000 UNIT/ML IJ SOLN
5000.0000 [IU] | Freq: Three times a day (TID) | INTRAMUSCULAR | Status: DC
  Administered 2022-05-12 – 2022-06-21 (×119): 5000 [IU] via SUBCUTANEOUS
  Filled 2022-05-11 (×121): qty 1

## 2022-05-11 MED ORDER — MIDAZOLAM HCL 2 MG/2ML IJ SOLN
2.0000 mg | Freq: Once | INTRAMUSCULAR | Status: AC
  Filled 2022-05-11: qty 2

## 2022-05-11 MED ORDER — FENTANYL BOLUS VIA INFUSION
200.0000 ug | Freq: Once | INTRAVENOUS | Status: AC
  Administered 2022-05-11: 200 ug via INTRAVENOUS
  Filled 2022-05-11: qty 200

## 2022-05-11 MED ORDER — DOCUSATE SODIUM 50 MG/5ML PO LIQD
100.0000 mg | Freq: Two times a day (BID) | ORAL | Status: DC
  Administered 2022-05-11 – 2022-05-12 (×2): 100 mg
  Filled 2022-05-11 (×2): qty 10

## 2022-05-11 MED ORDER — SUCCINYLCHOLINE CHLORIDE 200 MG/10ML IV SOSY
PREFILLED_SYRINGE | INTRAVENOUS | Status: AC
  Filled 2022-05-11: qty 10

## 2022-05-11 MED ORDER — ETOMIDATE 2 MG/ML IV SOLN: 20.0000 mg | Freq: Once | INTRAVENOUS | Status: AC

## 2022-05-11 NOTE — Progress Notes (Signed)
Notified that she is breathing 30-40 times per minute on BiPAP.  She endorses feeling dyspneic. She understands she needs to go back on the vent and we briefly discussed the plan to proceed to trach if this happened to give her longer to recover.   I called Monica to discuss this change with her. We discussed the need for trach since this is her second time failing, both fairly quickly after extubation and despite prophylactic bipap. She consented for tracheostomy, which may be able to be completed as soon as this afternoon. Consent obtained over phone from by myself and RN.  Julian Hy, DO 05/11/22 1:02 PM [facility]

## 2022-05-11 NOTE — Progress Notes (Signed)
PT Cancellation Note  Patient Details Name: MATY ZEISLER MRN: [id number] DOB: 1971/07/05   Cancelled Treatment:    Reason Eval/Treat Not Completed: Patient not medically ready. Pt failed extubation attempt today and received a trach. RN requesting hold on PT today. Will plan to follow-up tomorrow as able.   Moishe Spice, PT, DPT Acute Rehabilitation Services  Office: [place] 05/11/2022, 3:44 PM

## 2022-05-11 NOTE — Progress Notes (Addendum)
Advanced Heart Failure Rounding Note  PCP-Cardiologist: None   Subjective:    -Much more awake and alert today.  Moving all 4 extremities.  Nodding her head to command, good grip strength   Objective:   Weight Range: 108.6 kg Body mass index is 41.1 kg/m.   Vital Signs:   Temp:  [97.5 F (36.4 C)-99.9 F (37.7 C)] 99 F (37.2 C) (10/14 0915) Pulse Rate:  [77-115] 92 (10/14 0915) Resp:  [14-39] 22 (10/14 0915) BP: (110-144)/(57-73) 110/57 (10/13 1523) SpO2:  [100 %] 100 % (10/14 0915) Arterial Line BP: (95-149)/(49-78) 124/63 (10/14 0915) FiO2 (%):  [30 %] 30 % (10/14 0842) Weight:  [108.6 kg] 108.6 kg (10/14 0500) Last BM Date : 05/11/22  Weight change: Filed Weights   05/09/22 0202 05/10/22 0429 05/11/22 0500  Weight: 109.7 kg 108.7 kg 108.6 kg    Intake/Output:   Intake/Output Summary (Last 24 hours) at 05/11/2022 0937 Last data filed at 05/11/2022 0900 Gross per 24 hour  Intake 4560.55 ml  Output 4458 ml  Net 102.55 ml       Physical Exam   CVP 8 General: Intubated; sitting up awake alert HEENT: normal + ETT  Neck: supple. Thick neck, JVD not well visualized. Carotids 2+ bilat; no bruits. No lymphadenopathy or thyromegaly appreciated. Cor: PMI nondisplaced. Regular rate & rhythm. No rubs, gallops or murmurs. Lungs: intubated and coarse with mild basilar inspiratory crackles Abdomen: obese, soft, nontender, nondistended. No hepatosplenomegaly. No bruits or masses. Good bowel sounds. Extremities: no cyanosis, clubbing, rash, 1+ b/l LE edema Neuro: intubated , good grip strength moving bilateral lower and upper extremities  Telemetry   SR-ST 80-100s   Labs    CBC Recent Labs    05/10/22 0719 05/10/22 1316 05/11/22 0445  WBC 23.5*  --  21.0*  HGB 7.4* 7.0* 7.0*  HCT 22.6* 21.5* 21.6*  MCV 82.8  --  83.7  PLT 182  --  371    Basic Metabolic Panel Recent Labs    05/10/22 0403 05/10/22 1654 05/11/22 0445  NA 138 139 138  K 4.0  4.3 4.4  CL 107 107 105  CO2 '25 22 24  '$ GLUCOSE 144* 148* 142*  BUN 51* 51* 50*  CREATININE 2.22* 2.15* 2.12*  CALCIUM 7.2* 7.7* 7.8*  MG 2.7*  --  2.5*  PHOS 4.2 4.3 4.5    Liver Function Tests  Imaging   Pertinent imaging reviewed;  Echo 05/02/22: LVEF 20%, RV function moderately reduced.   Medications:     Scheduled Medications:  Chlorhexidine Gluconate Cloth  6 each Topical Daily   feeding supplement (PROSource TF20)  60 mL Per Tube BID   heparin injection (subcutaneous)  5,000 Units Subcutaneous Q8H   insulin aspart  0-20 Units Subcutaneous Q4H   insulin aspart  5 Units Subcutaneous Q4H   multivitamin  1 tablet Per Tube QHS   mouth rinse  15 mL Mouth Rinse Q2H   pantoprazole (PROTONIX) IV  40 mg Intravenous Q24H   polyethylene glycol  17 g Per Tube Daily   sodium chloride flush  10-40 mL Intracatheter Q12H   sodium chloride flush  3 mL Intravenous Q12H    Infusions:   prismasol BGK 4/2.5 400 mL/hr at 05/11/22 0120    prismasol BGK 4/2.5 400 mL/hr at 05/11/22 0120   sodium chloride     sodium chloride Stopped (05/04/22 2003)   sodium chloride 10 mL/hr at 05/11/22 0900   sodium chloride Stopped (05/09/22 1901)  sodium chloride     dexmedetomidine (PRECEDEX) IV infusion 0.8 mcg/kg/hr (05/11/22 0900)   fentaNYL infusion INTRAVENOUS 150 mcg/hr (05/11/22 0900)   meropenem (MERREM) IV Stopped (05/11/22 0626)   Peptamen 1.5 1,000 mL (05/10/22 2302)   prismasol BGK 4/2.5 1,500 mL/hr at 05/11/22 0913   TPN ADULT (ION) 35 mL/hr at 05/11/22 0900    PRN Medications: sodium chloride, sodium chloride, acetaminophen, alum & mag hydroxide-simeth, fentaNYL, fentaNYL (SUBLIMAZE) injection, fentaNYL (SUBLIMAZE) injection, heparin, HYDROmorphone (DILAUDID) injection, ondansetron (ZOFRAN) IV, mouth rinse, sodium chloride, sodium chloride flush, sodium chloride flush    Patient Profile  51 y.o. AAF with history of bipolar disorder/schizophrenia, DM II, obesity, prior abdominal  surgeries. Underwent robotic incisional hernia repair on 09/26.    Course c/b post-op ilieus/obstruction followed by septic shock 2/2 aspiration PNA, AKI on CRRT and cardiogenic shock.  Assessment/Plan  Cardiogenic & Septic shock -Septic shock 2/2 aspiration PNA. Procal > 150 -Taken emergently to cath lab 10/4 d/t concern for ST elevation MI. HS troponin > 24K. Coronaries clean. CI 1.87, RA mean 13, PCWP 19, LVEDP 16.  - WBC trending down -->28K-->pending: possibly 2/2 steroids.   - Blood cultures 10/3 negative - On vancomycin and meropenum per CCM. -Echocardiogram yesterday demonstrated normal LV and RV function.  Off inotropes, off pressors.  Stable cardiac index.  2. Acute systolic CHF -Nonischemic secondary to stress cardiomyopathy, normal LV and RV function on repeat echocardiogram.  3. Acute MI (resolved) -ST elevation in inferolateral leads on ECG  -HS troponin > 24K at peak -Cath with normal coronaries   4. Metabolic acidosis: -Improved; continue CRRT   5.  Oliguric renal failure: -No urine output over the past several days.  As we are attempting to extubate today I believe we should continue CRRT slightly net negative.  Will attempt Lasix challenge tomorrow.  6. Acute hypoxic respiratory failure: -2/2 aspiration PNA -Vent and abx management per CCM   7. Incisional hernia: -s/p robotic repair with mesh/lysis of adhesions this admit -GSU following   8. SBO -Post-op -Receiving TPN - Loose stools; stopping stool softners.   9. Thrombocytopenia  - improving, HIT negative, SRA negative.   Length of Stay: 17 Advanced Heart Failure Team Pager [phone] (M-F; 7a - 5p)  Please contact [facility] for night-coverage after hours (4p -7a ) and weekends on [email or web address]   Nya Monds,  Advanced heart failure 9:37 AM   CRITICAL CARE Performed by: Hebert Soho  Total critical care time: 33 minutes  Critical care time was exclusive of separately billable  procedures and treating other patients.  Critical care was necessary to treat or prevent imminent or life-threatening deterioration.  Critical care was time spent personally by me on the following activities: development of treatment plan with patient and/or surrogate as well as nursing, discussions with consultants, evaluation of patient's response to treatment, examination of patient, obtaining history from patient or surrogate, ordering and performing treatments and interventions, ordering and review of laboratory studies, ordering and review of radiographic studies, pulse oximetry and re-evaluation of patient's condition.

## 2022-05-11 NOTE — Procedures (Signed)
Bedside Tracheostomy Insertion Procedure Note   Patient Details:   Name: Patricia [name] DOB: 12/08/1970 MRN: [id number]  Procedure: Tracheostomy  Pre Procedure Assessment: ET Tube Size: ET Tube secured at lip (cm): Bite block in place: Yes Breath Sounds: Rhonch  Post Procedure Assessment: BP (!) 110/57   Pulse (!) 119   Temp (!) 100.5 F (38.1 C) (Axillary)   Resp (!) 34   Ht '5\' 4"'$  (1.626 m)   Wt 108.6 kg   LMP 07/07/2014 (Approximate)   SpO2 (!) 80%   BMI 41.10 kg/m  O2 sats: stable throughout Complications: No apparent complications Patient did tolerate procedure well Tracheostomy Brand:Shiley Tracheostomy Style:Cuffed Tracheostomy Size:  6 Tracheostomy Secured LPF:[id number] Tracheostomy Placement Confirmation:Trach cuff visualized and in place    Mcneil Sober 05/11/2022, 1:52 PM

## 2022-05-11 NOTE — Progress Notes (Signed)
Doing well on PS 8/PEEP5 with Vt ~700cc. RT to check for cuff leak. Planning to extubate to bipap.  BP (!) 110/57   Pulse 92   Temp 99 F (37.2 C)   Resp (!) 22   Ht '5\' 4"'$  (1.626 m)   Wt 108.6 kg   LMP 07/07/2014 (Approximate)   SpO2 100%   BMI 41.10 kg/m    Julian Hy, DO 05/11/22 10:08 AM [facility]

## 2022-05-11 NOTE — Progress Notes (Signed)
Patient ID: Patricia [name], female   DOB: 04/11/1971, 51 y.o.   MRN: [id number] S: Doing well with weaning trials with plans to extubate to BiPAP per PCCM.  Pt seen and examined while on CRRT.  Labs and orders reviewed and appropriate changes made.  Would continue with CRRT following extubation. O:BP (!) 110/57   Pulse 97   Temp 99.1 F (37.3 C)   Resp 20   Ht '5\' 4"'$  (1.626 m)   Wt 108.6 kg   LMP 07/07/2014 (Approximate)   SpO2 100%   BMI 41.10 kg/m   Intake/Output Summary (Last 24 hours) at 05/11/2022 1046 Last data filed at 05/11/2022 1000 Gross per 24 hour  Intake 4507.17 ml  Output 4473 ml  Net 34.17 ml   Intake/Output: I/O last 3 completed shifts: In: 7129.4 [I.V.:4057.4; Other:100; NG/GT:2255.5; IV Piggyback:716.6] Out: 6940 [Emesis/NG output:60; Stool:2425]  Intake/Output this shift:  Total I/O In: 391.5 [I.V.:241.5; NG/GT:150] Out: 404 [Stool:100] Weight change: -0.1 kg Gen: sitting upright on vent, awake and alert CVS:RRR Resp: ventilated BS bilaterally Abd: +BS, soft, NT/ND Ext: 2+ bilateral hand edema, legs wrapped  Recent Labs  Lab 05/05/22 0614 05/05/22 1510 05/08/22 0426 05/08/22 1846 05/09/22 0422 05/09/22 0836 05/09/22 1549 05/10/22 0403 05/10/22 1654 05/11/22 0445  NA 138   < > 137 138 140  --  138 138 139 138  K 3.6   < > 3.6 4.2 4.2  --  4.3 4.0 4.3 4.4  CL 106   < > 105 107 103  --  106 107 107 105  CO2 23   < > '25 23 23  '$ --  '23 25 22 24  '$ GLUCOSE 189*   < > 208* 211* 196*  --  139* 144* 148* 142*  BUN 34*   < > 42* 44* 43*  --  47* 51* 51* 50*  CREATININE 1.97*   < > 2.04* 1.90* 1.96*  --  2.15* 2.22* 2.15* 2.12*  ALBUMIN <1.5*   < > <1.5* <1.5* <1.5* <1.5* <1.5* <1.5* 1.6* 1.6*  CALCIUM 7.0*   < > 7.1* 7.3* 7.6*  --  7.1* 7.2* 7.7* 7.8*  PHOS  --    < > 3.8 4.0 4.3  --  4.0 4.2 4.3 4.5  AST 331*  --   --   --   --  385*  --   --   --   --   ALT 248*  --   --   --   --  324*  --   --   --   --    < > = values in this interval not  displayed.   Liver Function Tests: Recent Labs  Lab 05/05/22 0614 05/05/22 1510 05/09/22 0836 05/09/22 1549 05/10/22 0403 05/10/22 1654 05/11/22 0445  AST 331*  --  385*  --   --   --   --   ALT 248*  --  324*  --   --   --   --   ALKPHOS 167*  --  216*  --   --   --   --   BILITOT 1.3*  --  1.6*  --   --   --   --   PROT 4.6*  --  5.2*  --   --   --   --   ALBUMIN <1.5*   < > <1.5*   < > <1.5* 1.6* 1.6*   < > = values in this interval not displayed.  No results for input(s): "LIPASE", "AMYLASE" in the last 168 hours. No results for input(s): "AMMONIA" in the last 168 hours. CBC: Recent Labs  Lab 05/05/22 0426 05/05/22 2108 05/06/22 0415 05/07/22 0428 05/07/22 2146 05/08/22 0426 05/09/22 0422 05/10/22 0719 05/10/22 1316 05/11/22 0445  WBC 34.9*  --  43.7* 47.3*  --  33.0* 27.9* 23.5*  --  21.0*  NEUTROABS 31.4*  --  35.1* 35.8*  --   --   --   --   --   --   HGB 9.4*   < > 9.8* 8.6*   < > 7.9* 7.8* 7.4* 7.0* 7.0*  HCT 27.8*   < > 28.2* 25.5*   < > 23.4* 23.8* 22.6* 21.5* 21.6*  MCV 83.5  --  79.4* 81.7  --  80.4 82.9 82.8  --  83.7  PLT 39*  --  53* 102*  --  144* 153 182  --  173   < > = values in this interval not displayed.   Cardiac Enzymes: No results for input(s): "CKTOTAL", "CKMB", "CKMBINDEX", "TROPONINI" in the last 168 hours. CBG: Recent Labs  Lab 05/10/22 0801 05/10/22 1107 05/10/22 1655 05/10/22 1942 05/11/22 0728  GLUCAP 89 120* 133* 139* 111*    Iron Studies: No results for input(s): "IRON", "TIBC", "TRANSFERRIN", "FERRITIN" in the last 72 hours. Studies/Results: DG Abd Portable 1V  Result Date: 05/10/2022 CLINICAL DATA:  Encounter for feeding tube placement. EXAM: PORTABLE ABDOMEN - 1 VIEW COMPARISON:  CT 05/06/2022 FINDINGS: Weighted enteric tube courses along the expected location of the stomach in the duodenum. The tip is in the left upper quadrant in the region of the proximal jejunum. Paucity of upper abdominal bowel gas IMPRESSION:  Weighted enteric tube with tip in the left upper quadrant in the region of the proximal jejunum. Electronically Signed   By: Keith Rake M.D.   On: 05/10/2022 14:50   ECHOCARDIOGRAM COMPLETE  Result Date: 05/10/2022    ECHOCARDIOGRAM REPORT   Patient Name:   Patricia [name] [facility] Date of Exam: 05/10/2022 Medical Rec #:  [id number]          Height:       64.0 in Accession #:    [id number]         Weight:       239.6 lb Date of Birth:  02/12/1971          BSA:          2.113 m Patient Age:    5 years           BP:           145/73 mmHg Patient Gender: F                  HR:           93 bpm. Exam Location:  Inpatient Procedure: 2D Echo, Cardiac Doppler, Color Doppler and Intracardiac            Opacification Agent Indications:    CHF  History:        Patient has prior history of Echocardiogram examinations, most                 recent 05/02/2022. Arrythmias:Tachycardia; Risk                 Factors:Hypertension and Diabetes. Abnormal ECG. Bacteremia.  Sonographer:    Clayton Lefort RDCS (AE) Referring Phys: [id number] AMY D CLEGG  Sonographer Comments: Technically difficult study due to  poor echo windows, echo performed with patient supine and on artificial respirator and patient is obese. Image acquisition challenging due to patient body habitus and Image acquisition challenging due to respiratory motion. IMPRESSIONS  1. Study not well visualized and limited.. Left ventricular ejection fraction, by estimation, is 50 to 55%. The left ventricle has low normal function. The left ventricle has no regional wall motion abnormalities. Left ventricular diastolic parameters are consistent with Grade I diastolic dysfunction (impaired relaxation).  2. Right ventricular systolic function is normal. The right ventricular size is normal.  3. No evidence of mitral valve regurgitation.  4. Aortic valve regurgitation is not visualized.  5. The inferior vena cava is normal in size with greater than 50% respiratory variability,  suggesting right atrial pressure of 3 mmHg. Conclusion(s)/Recommendation(s): Study is not well visualized. EF appears improved from prior study 05/02/2022. FINDINGS  Left Ventricle: Study not well visualized and limited. Left ventricular ejection fraction, by estimation, is 50 to 55%. The left ventricle has low normal function. The left ventricle has no regional wall motion abnormalities. Definity contrast agent was  given IV to delineate the left ventricular endocardial borders. The left ventricular internal cavity size was normal in size. There is borderline left ventricular hypertrophy. Left ventricular diastolic parameters are consistent with Grade I diastolic dysfunction (impaired relaxation). Right Ventricle: The right ventricular size is normal. Right ventricular systolic function is normal. Left Atrium: Left atrial size was normal in size. Right Atrium: Right atrial size was normal in size. Pericardium: There is no evidence of pericardial effusion. Mitral Valve: No evidence of mitral valve regurgitation. Aortic Valve: Aortic valve regurgitation is not visualized. Aortic valve mean gradient measures 7.0 mmHg. Aortic valve peak gradient measures 12.8 mmHg. Aortic valve area, by VTI measures 3.38 cm. Aorta: The aortic root is normal in size and structure. Venous: The inferior vena cava is normal in size with greater than 50% respiratory variability, suggesting right atrial pressure of 3 mmHg.  LEFT VENTRICLE PLAX 2D LVIDd:         4.40 cm   Diastology LVIDs:         3.60 cm   LV e' medial:    6.74 cm/s LV PW:         1.10 cm   LV E/e' medial:  11.7 LV IVS:        1.00 cm   LV e' lateral:   12.20 cm/s LVOT diam:     2.40 cm   LV E/e' lateral: 6.4 LV SV:         87 LV SV Index:   41 LVOT Area:     4.52 cm  RIGHT VENTRICLE RV Basal diam:  2.40 cm RV S prime:     18.60 cm/s TAPSE (M-mode): 2.1 cm LEFT ATRIUM             Index        RIGHT ATRIUM          Index LA diam:        3.20 cm 1.51 cm/m   RA Area:     9.71  cm LA Vol (A2C):   49.1 ml 23.24 ml/m  RA Volume:   18.00 ml 8.52 ml/m LA Vol (A4C):   35.4 ml 16.76 ml/m LA Biplane Vol: 41.9 ml 19.83 ml/m  AORTIC VALVE AV Area (Vmax):    3.44 cm AV Area (Vmean):   3.32 cm AV Area (VTI):     3.38 cm AV Vmax:  179.00 cm/s AV Vmean:          118.000 cm/s AV VTI:            0.258 m AV Peak Grad:      12.8 mmHg AV Mean Grad:      7.0 mmHg LVOT Vmax:         136.00 cm/s LVOT Vmean:        86.500 cm/s LVOT VTI:          0.193 m LVOT/AV VTI ratio: 0.75  AORTA Ao Root diam: 3.80 cm MITRAL VALVE MV Area (PHT): 2.61 cm    SHUNTS MV Decel Time: 291 msec    Systemic VTI:  0.19 m MV E velocity: 78.60 cm/s  Systemic Diam: 2.40 cm MV A velocity: 82.80 cm/s MV E/A ratio:  0.95 Landscape architect signed by Phineas Inches Signature Date/Time: 05/10/2022/9:28:37 AM    Final     Chlorhexidine Gluconate Cloth  6 each Topical Daily   feeding supplement (PROSource TF20)  60 mL Per Tube BID   heparin injection (subcutaneous)  5,000 Units Subcutaneous Q8H   insulin aspart  0-20 Units Subcutaneous Q4H   insulin aspart  5 Units Subcutaneous Q4H   multivitamin  1 tablet Per Tube QHS   mouth rinse  15 mL Mouth Rinse Q2H   pantoprazole (PROTONIX) IV  40 mg Intravenous Q24H   polyethylene glycol  17 g Per Tube Daily   sodium chloride flush  10-40 mL Intracatheter Q12H   sodium chloride flush  3 mL Intravenous Q12H    BMET    Component Value Date/Time   NA 138 05/11/2022 0445   NA 139 07/02/2014 1923   K 4.4 05/11/2022 0445   K 3.9 07/02/2014 1923   CL 105 05/11/2022 0445   CL 105 07/02/2014 1923   CO2 24 05/11/2022 0445   CO2 26 07/02/2014 1923   GLUCOSE 142 (H) 05/11/2022 0445   GLUCOSE 95 07/02/2014 1923   BUN 50 (H) 05/11/2022 0445   BUN 7 07/02/2014 1923   CREATININE 2.12 (H) 05/11/2022 0445   CREATININE 0.98 07/02/2014 1923   CALCIUM 7.8 (L) 05/11/2022 0445   CALCIUM 8.5 07/02/2014 1923   GFRNONAA 28 (L) 05/11/2022 0445   GFRNONAA >60 07/02/2014  1923   GFRAA >60 04/22/2018 2215   GFRAA >60 07/02/2014 1923   CBC    Component Value Date/Time   WBC 21.0 (H) 05/11/2022 0445   RBC 2.58 (L) 05/11/2022 0445   HGB 7.0 (L) 05/11/2022 0445   HGB 9.4 (L) 07/02/2014 1923   HCT 21.6 (L) 05/11/2022 0445   HCT 30.3 (L) 07/02/2014 1923   PLT 173 05/11/2022 0445   PLT 449 (H) 07/02/2014 1923   MCV 83.7 05/11/2022 0445   MCV 71 (L) 07/02/2014 1923   MCH 27.1 05/11/2022 0445   MCHC 32.4 05/11/2022 0445   RDW 17.0 (H) 05/11/2022 0445   RDW 18.8 (H) 07/02/2014 1923   LYMPHSABS 2.0 05/07/2022 0428   MONOABS 2.4 (H) 05/07/2022 0428   EOSABS 0.0 05/07/2022 0428   BASOSABS 0.1 05/07/2022 0428    Assessment/Plan:   AKI - likely ATN in the setting of cardiogenic and septic shock.  Started on CRRT 05/01/22 after RIJ temp HD catheter placed.  Off pressors and would like to attempt IHD, however large intake each day (over 3 liters).  Ok to try high dose lasix challenge and see if her UOP improves.  If we can concentrate her IVF's, we may  be able to transition to IHD this weekend.  Hopefully after extubation will be able to stop most of her drips.   Once filter clots, would try lasix challenge.  Would continue with CRRT following extubation for the next 24 hours. All fluids 4K/2.5Ca, pre and post-filter rates at 400 mL/hr, dialysate rate 1500 mL/hr, no anticoagulation due to thrombocytopenia.  UF goal for CVP of 7-9 mmHg.  To keep even again today due to low CVP Remains anuric, continue with CRRT as ordered. Cardiogenic/septic shock - weaned off levophed, on dobutamine.  Heart failure team following. Likely due to aspiration pneumonia. Acute hypoxic respiratory failure - was re-intubated 05/05/22.  Vent per PCCM and possible trach. Infectious enteritis - seen on CT scan.  WBC continues to climb.  Plan per PCCM. Anemia of critical illness - transfuse for Hgb <7. Acute MI - normal coronaries by cath [facility] - improved with CRRT. Thrombocytopenia -  improving.  S/p robotic incisional hernia repair with mesh and LOA - Surgery following.     Donetta Potts, MD [facility]

## 2022-05-11 NOTE — Progress Notes (Signed)
10 Days Post-Op   Subjective/Chief Complaint: Still on the vent Awake, following commands Tolerating tube feeds Denies abdominal pain   Objective: Vital signs in last 24 hours: Temp:  [97.5 F (36.4 C)-99.9 F (37.7 C)] 98.8 F (37.1 C) (10/14 0815) Pulse Rate:  [77-115] 87 (10/14 0842) Resp:  [14-39] 19 (10/14 0842) BP: (110-144)/(57-73) 110/57 (10/13 1523) SpO2:  [100 %] 100 % (10/14 0842) Arterial Line BP: (95-149)/(49-78) 101/52 (10/14 0815) FiO2 (%):  [30 %] 30 % (10/14 0842) Weight:  [108.6 kg] 108.6 kg (10/14 0500) Last BM Date : 05/11/22  Intake/Output from previous day: 10/13 0701 - 10/14 0700 In: 4603.8 [I.V.:2562.7; NG/GT:1455; IV Piggyback:516.1] Out: 4416 [Emesis/NG output:60; Stool:1100] Intake/Output this shift: Total I/O In: 132 [I.V.:82; NG/GT:50] Out: 96   Exam: Sitting up in bed On vent Follows commands Abdomen soft, obese, non-tender  Lab Results:  Recent Labs    05/10/22 0719 05/10/22 1316 05/11/22 0445  WBC 23.5*  --  21.0*  HGB 7.4* 7.0* 7.0*  HCT 22.6* 21.5* 21.6*  PLT 182  --  173   BMET Recent Labs    05/10/22 1654 05/11/22 0445  NA 139 138  K 4.3 4.4  CL 107 105  CO2 22 24  GLUCOSE 148* 142*  BUN 51* 50*  CREATININE 2.15* 2.12*  CALCIUM 7.7* 7.8*   PT/INR No results for input(s): "LABPROT", "INR" in the last 72 hours. ABG No results for input(s): "PHART", "HCO3" in the last 72 hours.  Invalid input(s): "PCO2", "PO2"  Studies/Results: DG Abd Portable 1V  Result Date: 05/10/2022 CLINICAL DATA:  Encounter for feeding tube placement. EXAM: PORTABLE ABDOMEN - 1 VIEW COMPARISON:  CT 05/06/2022 FINDINGS: Weighted enteric tube courses along the expected location of the stomach in the duodenum. The tip is in the left upper quadrant in the region of the proximal jejunum. Paucity of upper abdominal bowel gas IMPRESSION: Weighted enteric tube with tip in the left upper quadrant in the region of the proximal jejunum.  Electronically Signed   By: Keith Rake M.D.   On: 05/10/2022 14:50   ECHOCARDIOGRAM COMPLETE  Result Date: 05/10/2022    ECHOCARDIOGRAM REPORT   Patient Name:   CATALEYAH COLBORN [facility] Date of Exam: 05/10/2022 Medical Rec #:  [id number]          Height:       64.0 in Accession #:    [id number]         Weight:       239.6 lb Date of Birth:  06-17-1971          BSA:          2.113 m Patient Age:    51 years           BP:           145/73 mmHg Patient Gender: F                  HR:           93 bpm. Exam Location:  Inpatient Procedure: 2D Echo, Cardiac Doppler, Color Doppler and Intracardiac            Opacification Agent Indications:    CHF  History:        Patient has prior history of Echocardiogram examinations, most                 recent 05/02/2022. Arrythmias:Tachycardia; Risk  Factors:Hypertension and Diabetes. Abnormal ECG. Bacteremia.  Sonographer:    Clayton Lefort RDCS (AE) Referring Phys: [phone] AMY D CLEGG  Sonographer Comments: Technically difficult study due to poor echo windows, echo performed with patient supine and on artificial respirator and patient is obese. Image acquisition challenging due to patient body habitus and Image acquisition challenging due to respiratory motion. IMPRESSIONS  1. Study not well visualized and limited.. Left ventricular ejection fraction, by estimation, is 50 to 55%. The left ventricle has low normal function. The left ventricle has no regional wall motion abnormalities. Left ventricular diastolic parameters are consistent with Grade I diastolic dysfunction (impaired relaxation).  2. Right ventricular systolic function is normal. The right ventricular size is normal.  3. No evidence of mitral valve regurgitation.  4. Aortic valve regurgitation is not visualized.  5. The inferior vena cava is normal in size with greater than 50% respiratory variability, suggesting right atrial pressure of 3 mmHg. Conclusion(s)/Recommendation(s): Study is not well visualized.  EF appears improved from prior study 05/02/2022. FINDINGS  Left Ventricle: Study not well visualized and limited. Left ventricular ejection fraction, by estimation, is 50 to 55%. The left ventricle has low normal function. The left ventricle has no regional wall motion abnormalities. Definity contrast agent was  given IV to delineate the left ventricular endocardial borders. The left ventricular internal cavity size was normal in size. There is borderline left ventricular hypertrophy. Left ventricular diastolic parameters are consistent with Grade I diastolic dysfunction (impaired relaxation). Right Ventricle: The right ventricular size is normal. Right ventricular systolic function is normal. Left Atrium: Left atrial size was normal in size. Right Atrium: Right atrial size was normal in size. Pericardium: There is no evidence of pericardial effusion. Mitral Valve: No evidence of mitral valve regurgitation. Aortic Valve: Aortic valve regurgitation is not visualized. Aortic valve mean gradient measures 7.0 mmHg. Aortic valve peak gradient measures 12.8 mmHg. Aortic valve area, by VTI measures 3.38 cm. Aorta: The aortic root is normal in size and structure. Venous: The inferior vena cava is normal in size with greater than 50% respiratory variability, suggesting right atrial pressure of 3 mmHg.  LEFT VENTRICLE PLAX 2D LVIDd:         4.40 cm   Diastology LVIDs:         3.60 cm   LV e' medial:    6.74 cm/s LV PW:         1.10 cm   LV E/e' medial:  11.7 LV IVS:        1.00 cm   LV e' lateral:   12.20 cm/s LVOT diam:     2.40 cm   LV E/e' lateral: 6.4 LV SV:         87 LV SV Index:   41 LVOT Area:     4.52 cm  RIGHT VENTRICLE RV Basal diam:  2.40 cm RV S prime:     18.60 cm/s TAPSE (M-mode): 2.1 cm LEFT ATRIUM             Index        RIGHT ATRIUM          Index LA diam:        3.20 cm 1.51 cm/m   RA Area:     9.71 cm LA Vol (A2C):   49.1 ml 23.24 ml/m  RA Volume:   18.00 ml 8.52 ml/m LA Vol (A4C):   35.4 ml 16.76  ml/m LA Biplane Vol: 41.9 ml 19.83 ml/m  AORTIC VALVE  AV Area (Vmax):    3.44 cm AV Area (Vmean):   3.32 cm AV Area (VTI):     3.38 cm AV Vmax:           179.00 cm/s AV Vmean:          118.000 cm/s AV VTI:            0.258 m AV Peak Grad:      12.8 mmHg AV Mean Grad:      7.0 mmHg LVOT Vmax:         136.00 cm/s LVOT Vmean:        86.500 cm/s LVOT VTI:          0.193 m LVOT/AV VTI ratio: 0.75  AORTA Ao Root diam: 3.80 cm MITRAL VALVE MV Area (PHT): 2.61 cm    SHUNTS MV Decel Time: 291 msec    Systemic VTI:  0.19 m MV E velocity: 78.60 cm/s  Systemic Diam: 2.40 cm MV A velocity: 82.80 cm/s MV E/A ratio:  0.95 Mary Scientist, physiological signed by Phineas Inches Signature Date/Time: 05/10/2022/9:28:37 AM    Final     Anti-infectives: Anti-infectives (From admission, onward)    Start     Dose/Rate Route Frequency Ordered Stop   05/06/22 1530  meropenem (MERREM) 1 g in sodium chloride 0.9 % 100 mL IVPB        1 g 200 mL/hr over 30 Minutes Intravenous Every 8 hours 05/06/22 1438     05/04/22 1000  vancomycin (VANCOCIN) IVPB 1000 mg/200 mL premix  Status:  Discontinued        1,000 mg 200 mL/hr over 60 Minutes Intravenous Every 24 hours 05/03/22 0802 05/08/22 0913   05/03/22 0845  vancomycin (VANCOREADY) IVPB 2000 mg/400 mL        2,000 mg 200 mL/hr over 120 Minutes Intravenous  Once 05/03/22 0751 05/03/22 1114   05/01/22 1800  piperacillin-tazobactam (ZOSYN) IVPB 3.375 g  Status:  Discontinued        3.375 g 12.5 mL/hr over 240 Minutes Intravenous Every 6 hours 05/01/22 1426 05/01/22 1427   05/01/22 1515  piperacillin-tazobactam (ZOSYN) IVPB 3.375 g  Status:  Discontinued        3.375 g 100 mL/hr over 30 Minutes Intravenous Every 6 hours 05/01/22 1428 05/06/22 1438   04/30/22 1400  piperacillin-tazobactam (ZOSYN) IVPB 2.25 g  Status:  Discontinued        2.25 g 100 mL/hr over 30 Minutes Intravenous Every 8 hours 04/30/22 1325 05/01/22 1426   04/29/22 1530  piperacillin-tazobactam (ZOSYN) IVPB  3.375 g  Status:  Discontinued        3.375 g 12.5 mL/hr over 240 Minutes Intravenous Every 8 hours 04/29/22 1433 04/30/22 1325   04/29/22 1145  metroNIDAZOLE (FLAGYL) IVPB 500 mg  Status:  Discontinued        500 mg 100 mL/hr over 60 Minutes Intravenous Every 12 hours 04/29/22 1047 04/29/22 1433   04/29/22 1145  vancomycin (VANCOCIN) IVPB 1000 mg/200 mL premix  Status:  Discontinued        1,000 mg 200 mL/hr over 60 Minutes Intravenous  Once 04/29/22 1047 04/29/22 1055   04/29/22 1145  vancomycin (VANCOREADY) IVPB 1500 mg/300 mL  Status:  Discontinued        1,500 mg 150 mL/hr over 120 Minutes Intravenous Every 24 hours 04/29/22 1055 04/29/22 1357   04/29/22 1045  ceFEPIme (MAXIPIME) 2 g in sodium chloride 0.9 % 100 mL IVPB  Status:  Discontinued  2 g 200 mL/hr over 30 Minutes Intravenous Every 8 hours 04/29/22 0954 04/29/22 1433   04/23/22 0730  ceFAZolin (ANCEF) IVPB 2g/100 mL premix        2 g 200 mL/hr over 30 Minutes Intravenous On call to O.R. 04/23/22 0726 04/23/22 9201       Assessment/Plan: Ms. Lucente is s/p robotic incisional hernia repair on 04/23/22 -Postop ileus complicated by aspiration: Ileus resolved, having bowel function.  Tolerating tube feeds -ok to ween TNA off when at goal Abdomen fairly benign on exam   Cardiogenic/septic shock -Antibiotics per CCM -cath 10/4 - no obstructive CAD   AKI -CRRT.  Creatinine stable   Resp failure -Vent management per CCM.  Probable plan to try extubation again prior to trach   Coralie Keens MD 05/11/2022

## 2022-05-11 NOTE — Progress Notes (Signed)
Orthopedic Tech Progress Note Patient Details:  Patricia [name] 1971/06/22 [id number]  Unna boots applied to pt on 05/11/2022  Ortho Devices Type of Ortho Device: Haematologist Ortho Device/Splint Location: BILATERAL Ortho Device/Splint Interventions: Ordered, Application, Adjustment   Post Interventions Patient Tolerated: Well Instructions Provided: Adjustment of device, Care of device  Arville Go 05/11/2022, 10:10 AM

## 2022-05-11 NOTE — Procedures (Signed)
Intubation Procedure Note  Patricia [name]  [id number]  Dec 03, 1970  Date:05/11/22  Time:1:48 PM   Provider Performing:Jermane Brayboy P Carlis Abbott    Procedure: Intubation (00923)  Indication(s) Respiratory Failure- recurrent, after extubation  Consent Risks of the procedure as well as the alternatives and risks of each were explained to the patient and/or caregiver.  Consent for the procedure was obtained and is signed in the bedside chart   Anesthesia Etomidate, Fentanyl, and Rocuronium   Time Out Verified patient identification, verified procedure, site/side was marked, verified correct patient position, special equipment/implants available, medications/allergies/relevant history reviewed, required imaging and test results available.   Sterile Technique Usual hand hygeine, masks, and gloves were used   Procedure Description Patient positioned in bed supine.  Sedation given as noted above.  Patient was intubated with endotracheal tube using Glidescope.  View was Grade 1 full glottis .  Number of attempts was 1.  Colorimetric CO2 detector was consistent with tracheal placement.   Complications/Tolerance None; patient tolerated the procedure well. Chest X-ray is ordered to verify placement.   EBL Minimal   Specimen(s) None  Julian Hy, DO 05/11/22 1:48 PM [facility]

## 2022-05-11 NOTE — Progress Notes (Signed)
PHARMACY - TOTAL PARENTERAL NUTRITION CONSULT NOTE  Indication: Small bowel obstruction and ileus   Patient Measurements: Height: _0  (162.6 cm) Weight: 108.6 kg (239 lb 6.7 oz) IBW/kg (Calculated) : 54.7 TPN AdjBW (KG): 68.2 Body mass index is 41.1 kg/m.  Assessment:  51 yo female s/p difficult incisional hernia repair with long LoA on 9/26.  Patient developed a SBO based on CT scan 10/2.  Poor PO intake since surgery due to abdominal pain.  Pharmacy consulted to manage TPN.  Patient was extubated and then re-intubated on 10/08 due to respiratory distress. Initiated on TF on 10/09.  Glucose / Insulin: A1c 6.3% - CBGs 111-142 Used 12 units rSSI (down), Novolog 5 units Q4H, 25 units in TPN (decr 10/13) Electrolytes: K 4.4, CoCa 9.7, Mag 2.5 (none in TPN), others WNL Renal: CRRT started 10/04, BUN 50 Hepatic: (10/12) AST/ALT up 335/324, alk phos up to 216, tbili 1.6, TG 180 Intake / Output; MIVF: anuric, NG 60 mL, stool 1425 mL, CRRT 3.5L, net +16.6L  GI Imaging: 10/3 CT: continued SBO  10/2 CT: SBO 10/9 CT: infectious or inflammatory enteritis GI Surgeries / Procedures: none since TPN initiation   Central access: CVC triple lumen placed 05/04/22 TPN start date: 04/29/22  Nutritional Goals: RD Estimated Needs Total Energy Estimated Needs: 1800-2000 kcal/d Total Protein Estimated Needs: 110-130 grams Total Fluid Estimated Needs: 1.8-2L/d  Current Nutrition:  TPN TF (Vital 1.5) @ 68m/hr (65g protein, 1440 kcal/24hrs) (goal rate 566mhr)  Plan:  Concentrated TPN at 35 ml/hr, meeting 50% of needs.  Lipids at 20% of kCal (added back 10/6). Electrolytes in TPN: Na 15472mL, K 0mE52m, Ca 12mE31m Mg 0mEq/18mPhos 25mmol51mCl:Ac 1:1 Add standard MVI and trace elements to TPN, chromium removed for CRRT  Continue to replace K outside of TPN as needed Continue resistant SSI Q4H and decrease regular insulin to 20 units in TPN  Reduced Novolog 5 units Q4H per CCM Renal function  panel BID and daily Mg while on CRRT per Renal TG level weekly on Mondays Plan to continue TPN until pt tolerating goal TFs given previous GI complications during hospitalization ; TF currently tolerated at goal 50ml/hr76mhank you for allowing pharmacy to be a part of this patient's care.  AdrienneArdyth Harps Clinical Pharmacist

## 2022-05-11 NOTE — Progress Notes (Signed)
NAME:  Patricia [name], MRN:  [id number], DOB:  18-Aug-1970, LOS: 54 ADMISSION DATE:  04/23/2022, CONSULTATION DATE:  04/29/2022 REFERRING MD:  Felicie Morn, MD, CHIEF COMPLAINT:   Small bowel obstruction, aspiration event, sepsis, respiratory failure    History of Present Illness:  Ms. Patricia [name] with above hx and nuc study in 2015 neg for ischemia done for chest pain and echo with EF 60-65% presented to hospital for excisional hernia repair (robotic hernia repair 04/23/22) with mesh, ileus post op on TNA. Developed vomiting and hypotension after NG came out on 04/29/22 and PNA on CXR, was tachycardic and BP improved with IV fluids. DX with sepsis and ABX started. At one point prolonged Qtc. Pt admitted to ICU on 10/2.  Was placed on levophed. Early AM on 10/3 was intubated for worsening metabolic/lactic acidosis. She had AKI as well. Crt was 6.01, her normal 0.9. Today Cr is 4 on CRRT.  Pertinent  Medical History  DM2 Depression OSA Migraines  Asthma Obesity Schizophrenia HTN HLD  Significant Hospital Events: Including procedures, antibiotic start and stop dates in addition to other pertinent events   9/26 Robotic hernia repair complicated by abd adhesions.  10/2 CT + for developing SBO and bilat lower lobe opacities most likely aspiration PNA. Progressive decompensation requiring transfer to ICU 10/2 PCCM consulted for respiratory failure & shock 10\3 Intubated for resp failure. CVL and arterial line placed. 04/30/22: EF 30-35%, RV moderately reduced, mild to moderate MR, IVC collapsed throughout respiratory cycle 10\4 EKG changes/trop increased--> and taken to cath lab - No obstructive CAD, but in decompensated heart failure, Bedside echo with EF 15%, new WMA, moderate RV dysfunction, trivial pericardial effusion.underwent emergent L/RHC. Normal coronaries, RA mean 13, PA mean 29, LVEDP 16, PCWP 19, CO/CI 3.96/1.87   returned to CICU post-cath. Right heart cath placed right fem.  Right IJ HD cath placed.  10/5 advanced HF team following, shock supported on norepi/vasopressin and Dobutamine. On CRRT.  10/6 weaning Norepi. Starting to wean fent. Needing restraints.  10/8: Reintubated due to neuromuscular weakness.  Bedside echo demonstrating improved RV function, LV function remained depressed 10/9 CT abdomen pelvis.suggesting wall thickening throughout the proximal and mid small bowel consistent with possible infectious or inflammatory enteritis there is also bibasilar airspace disease which could reflect pneumonia.  Antibiotic coverage widened.  Zosyn changed to meropenem.  Tolerating trickle feeds still on TPN 10/10 stopped stress dose steroids.  Resumed subcutaneous heparin. 10/13 albumin for coox decrease. Weaning vent . Failed SBT. At goal TF.  Interim History / Subjective:  She denies complaints.   Objective   Blood pressure (!) 110/57, pulse 87, temperature 98.8 F (37.1 C), resp. rate 19, height '5\' 4"'$  (1.626 m), weight 108.6 kg, last menstrual period 07/07/2014, SpO2 100 %. CVP:  [3 mmHg-19 mmHg] 7 mmHg  Vent Mode: PRVC FiO2 (%):  [30 %] 30 % Set Rate:  [20 bmp] 20 bmp Vt Set:  [460 mL] 460 mL PEEP:  [5 cmH20] 5 cmH20 Pressure Support:  [5 cmH20-8 cmH20] 8 cmH20 Plateau Pressure:  [16 cmH20] 16 cmH20   Intake/Output Summary (Last 24 hours) at 05/11/2022 0917 Last data filed at 05/11/2022 0900 Gross per 24 hour  Intake 4560.55 ml  Output 4360 ml  Net 200.55 ml    Filed Weights   05/09/22 0202 05/10/22 0429 05/11/22 0500  Weight: 109.7 kg 108.7 kg 108.6 kg    Examination: General: critically ill appearing woman lying in bed in NAD, on MV HEENT [place]/AT,  eyes anicteric  Pulmonary: breathing comfortably on MV, CTAB. Minimal tracheal secretions.  Cardiac: S1S2, RRR Abdomen: obese, soft, NT, incisions healing well Extremities: pedal edema Neuro: RASS 0, following commands, able to lift head off the pillow.    Coox 51% BUN 50 Cr 2.12 WBC 21 H/H  7/21.6  Resolved problems:   Assessment & Plan:   Acute metabolic encephalopathy superimposed on hx bipolar disorder, schizophrenia   ICU delirium- improved -not on PTA bipolar meds PTA -PAD protocol; doing well on precedex currently -delirium precuations   Acute biventricular HFrEF (Etiology sepsis, stress induced CM. Had clean coronaries) Cardiogenic shock resolved -monitor daily coox -appreciate heart failure team's management -con't CRRT for volume management  Acute hypoxic respiratory failure Aspiration PNA Pulm edema   RLL VAP  -complete 10 days meropenem, then stop -LVV -VAP prevention protocol -PAD protocol -SAT & SBT; if she failed extubation a second time, would proceed with trach  AKI with oliguria AGMA -CRRT per nephrolgoy -lasix trial once off CRRT -strict I/O -renally dose meds, avoid nephrotoxic meds  S/p hernia repair S/p LOA Ileus  inflammatory enteritis  -con't TF at goal -cut TPN in half today, 25% tomorrow, then off if still tolerating TF -meropenem for 7-10 days -appreciate surgery's management  Suspected sepsis -- inflammatory enteritis, PPNA Initially thought leukocytosis secondary to steroids, however CT imaging suggesting wall thickening throughout the proximal and mid small bowel --? possible infectious or inflammatory enteritis, as well as ? Basilar PNA  -Cont mero. Total duration not yet determined, planning for 10 days  Anemia, acute on chronic.  -transfuse for Hb<7 or hemodynamically significant bleeding -monitor  DM2 with hyperglycemia  -TPN with insulin -SSI PRN -decrease TF coverage 5 units Q5h> plan to drop more tomorrow with further decrease in TPN. -goal BG 140-180  Severe protein calorie malnutrition  -TF, weaning TPN   Best Practice (right click and "Reselect all SmartList Selections" daily)   Diet/type: tubefeeds and TPN DVT prophylaxis: prophylactic heparin  GI prophylaxis: PPI Lines: Central line and Dialysis  Catheter Foley:  Yes, and it is still needed Code Status:  full code This patient is critically ill with multiple organ system failure which requires frequent high complexity decision making, assessment, support, evaluation, and titration of therapies. This was completed through the application of advanced monitoring technologies and extensive interpretation of multiple databases. During this encounter critical care time was devoted to patient care services described in this note for 44 minutes.  Julian Hy, DO 05/11/22 9:35 AM [facility]

## 2022-05-11 NOTE — Procedures (Signed)
Percutaneous Tracheostomy Procedure Note   Patricia [name]  [id number]  [birth year]  Date:05/11/22  Time:1:46 PM   Provider Performing:Taquan Bralley C Tamala Julian  Procedure: Percutaneous Tracheostomy with Bronchoscopic Guidance (31600)  Indication(s) Persistent resp failure  Consent Risks of the procedure as well as the alternatives and risks of each were explained to the patient and/or caregiver.  Consent for the procedure was obtained.  Anesthesia See intubation note   Time Out Verified patient identification, verified procedure, site/side was marked, verified correct patient position, special equipment/implants available, medications/allergies/relevant history reviewed, required imaging and test results available.   Sterile Technique Maximal sterile technique including sterile barrier drape, hand hygiene, sterile gown, sterile gloves, mask, hair covering.    Procedure Description Appropriate anatomy identified by palpation.  Patient's neck prepped and draped in sterile fashion.  1% lidocaine with epinephrine was used to anesthetize skin overlying neck.  1.5cm incision made and blunt dissection performed until tracheal rings could be easily palpated.   Then a size 6-0 Shiley tracheostomy was placed under bronchoscopic visualization using usual Seldinger technique and serial dilation.   Bronchoscope confirmed placement above the carina.  Tracheostomy was sutured in place with adhesive pad to protect skin under pressure.    Patient connected to ventilator.   Complications/Tolerance None; patient tolerated the procedure well. Chest X-ray is ordered to confirm no post-procedural complication.   EBL Minimal   Specimen(s) None

## 2022-05-11 NOTE — Procedures (Signed)
Diagnostic Bronchoscopy  Patricia [name]  [id number]  [birth year]  Date:05/11/22  Time:1:48 PM   Provider Performing:Anuoluwapo Mefferd Naomie Dean   Procedure: Diagnostic Bronchoscopy (35456)  Indication(s) Assist with direct visualization of tracheostomy placement  Consent Risks of the procedure as well as the alternatives and risks of each were explained to the patient and/or caregiver.  Consent for the procedure was obtained.   Anesthesia See separate tracheostomy note   Time Out Verified patient identification, verified procedure, site/side was marked, verified correct patient position, special equipment/implants available, medications/allergies/relevant history reviewed, required imaging and test results available.   Sterile Technique Usual hand hygiene, masks, gowns, and gloves were used   Procedure Description Bronchoscope advanced through endotracheal tube and into airway.  After suctioning out tracheal secretions, bronchoscope used to provide direct visualization of tracheostomy placement. Topical epinephrine applied to inside of stoma before ETT was removed.   Complications/Tolerance None; patient tolerated the procedure well.   EBL None  Specimen(s) None  Julian Hy, DO 05/11/22 1:49 PM [facility]

## 2022-05-11 NOTE — Procedures (Signed)
Extubation Procedure Note  Patient Details:   Name: Patricia [name] DOB: 1970/11/16 MRN: [id number]   Airway Documentation:    Vent end date: 05/11/22 Vent end time: 1120   Evaluation  O2 sats: stable throughout Complications: No apparent complications Patient did tolerate procedure well. Bilateral Breath Sounds: Diminished, Clear   Yes, pt able to cough to clear secretions. RT placed pt on BiPAP and pt is tolerating well at this time.   Virgilio Frees 05/11/2022, 11:25 AM

## 2022-05-12 DIAGNOSIS — Z930 Tracheostomy status: Secondary | ICD-10-CM | POA: Diagnosis not present

## 2022-05-12 DIAGNOSIS — J9601 Acute respiratory failure with hypoxia: Secondary | ICD-10-CM | POA: Diagnosis not present

## 2022-05-12 DIAGNOSIS — R570 Cardiogenic shock: Secondary | ICD-10-CM | POA: Diagnosis not present

## 2022-05-12 DIAGNOSIS — Z9911 Dependence on respirator [ventilator] status: Secondary | ICD-10-CM | POA: Diagnosis not present

## 2022-05-12 DIAGNOSIS — R6521 Severe sepsis with septic shock: Secondary | ICD-10-CM | POA: Diagnosis not present

## 2022-05-12 DIAGNOSIS — A419 Sepsis, unspecified organism: Secondary | ICD-10-CM | POA: Diagnosis not present

## 2022-05-12 LAB — RENAL FUNCTION PANEL
Albumin: <1.5 g/dL — ABNORMAL LOW (ref 3.5–5.0)
Albumin: <1.5 g/dL — ABNORMAL LOW (ref 3.5–5.0)
Anion gap: 7 (ref 5–15)
Anion gap: 9 (ref 5–15)
BUN: 53 mg/dL — ABNORMAL HIGH (ref 6–20)
BUN: 76 mg/dL — ABNORMAL HIGH (ref 6–20)
CO2: 23 mmol/L (ref 22–32)
CO2: 20 mmol/L — ABNORMAL LOW (ref 22–32)
Calcium: 8 mg/dL — ABNORMAL LOW (ref 8.9–10.3)
Calcium: 7.7 mg/dL — ABNORMAL LOW (ref 8.9–10.3)
Chloride: 107 mmol/L (ref 98–111)
Chloride: 107 mmol/L (ref 98–111)
Creatinine, Ser: 2.45 mg/dL — ABNORMAL HIGH (ref 0.44–1.00)
Creatinine, Ser: 3.39 mg/dL — ABNORMAL HIGH (ref 0.44–1.00)
GFR, Estimated: 23 mL/min — ABNORMAL LOW (ref >60)
GFR, Estimated: 16 mL/min — ABNORMAL LOW (ref >60)
Glucose, Bld: 154 mg/dL — ABNORMAL HIGH (ref 70–99)
Glucose, Bld: 146 mg/dL — ABNORMAL HIGH (ref 70–99)
Phosphorus: 5.4 mg/dL — ABNORMAL HIGH (ref 2.5–4.6)
Phosphorus: 6.7 mg/dL — ABNORMAL HIGH (ref 2.5–4.6)
Potassium: 4.8 mmol/L (ref 3.5–5.1)
Potassium: 4.9 mmol/L (ref 3.5–5.1)
Sodium: 137 mmol/L (ref 135–145)
Sodium: 136 mmol/L (ref 135–145)

## 2022-05-12 LAB — CBC
HCT: 19.2 % — ABNORMAL LOW (ref 36.0–46.0)
Hemoglobin: 6.2 g/dL — CL (ref 12.0–15.0)
MCH: 27.3 pg (ref 26.0–34.0)
MCHC: 32.3 g/dL (ref 30.0–36.0)
MCV: 84.6 fL (ref 80.0–100.0)
Platelets: 177 10*3/uL (ref 150–400)
RBC: 2.27 MIL/uL — ABNORMAL LOW (ref 3.87–5.11)
RDW: 16.6 % — ABNORMAL HIGH (ref 11.5–15.5)
WBC: 15.4 10*3/uL — ABNORMAL HIGH (ref 4.0–10.5)
nRBC: 0.2 % (ref 0.0–0.2)

## 2022-05-12 LAB — MAGNESIUM: Magnesium: 2.3 mg/dL (ref 1.7–2.4)

## 2022-05-12 LAB — COOXEMETRY PANEL
Carboxyhemoglobin: 1.2 % (ref 0.5–1.5)
Carboxyhemoglobin: 1.1 % (ref 0.5–1.5)
Methemoglobin: <0.7 % (ref 0.0–1.5)
Methemoglobin: 1.1 % (ref 0.0–1.5)
O2 Saturation: 71.3 %
O2 Saturation: 71 %
Total hemoglobin: <6.9 g/dL — CL (ref 12.0–16.0)
Total hemoglobin: <6.9 g/dL — CL (ref 12.0–16.0)

## 2022-05-12 LAB — PREPARE RBC (CROSSMATCH)

## 2022-05-12 LAB — HEPATITIS B SURFACE ANTIBODY,QUALITATIVE: Hep B S Ab: REACTIVE — AB

## 2022-05-12 LAB — GLUCOSE, CAPILLARY
Glucose-Capillary: 114 mg/dL — ABNORMAL HIGH (ref 70–99)
Glucose-Capillary: 125 mg/dL — ABNORMAL HIGH (ref 70–99)
Glucose-Capillary: 132 mg/dL — ABNORMAL HIGH (ref 70–99)
Glucose-Capillary: 145 mg/dL — ABNORMAL HIGH (ref 70–99)
Glucose-Capillary: 147 mg/dL — ABNORMAL HIGH (ref 70–99)
Glucose-Capillary: 162 mg/dL — ABNORMAL HIGH (ref 70–99)

## 2022-05-12 LAB — HEPATITIS C ANTIBODY: HCV Ab: REACTIVE — AB

## 2022-05-12 LAB — HEPATITIS B SURFACE ANTIGEN: Hepatitis B Surface Ag: NONREACTIVE

## 2022-05-12 LAB — HEMOGLOBIN AND HEMATOCRIT, BLOOD
HCT: 21.6 % — ABNORMAL LOW (ref 36.0–46.0)
Hemoglobin: 7.1 g/dL — ABNORMAL LOW (ref 12.0–15.0)

## 2022-05-12 LAB — HEPATITIS B CORE ANTIBODY, TOTAL: Hep B Core Total Ab: NONREACTIVE

## 2022-05-12 MED ORDER — SODIUM CHLORIDE 0.9 % IV SOLN: 500.0000 mg | Freq: Two times a day (BID) | INTRAVENOUS | Status: DC

## 2022-05-12 MED ORDER — CHLORHEXIDINE GLUCONATE CLOTH 2 % EX PADS
6.0000 | MEDICATED_PAD | Freq: Every day | CUTANEOUS | Status: DC
  Administered 2022-05-12 – 2022-05-29 (×14): 6 via TOPICAL

## 2022-05-12 MED ORDER — SODIUM CHLORIDE 0.9 % IV SOLN
500.0000 mg | Freq: Two times a day (BID) | INTRAVENOUS | Status: DC
  Administered 2022-05-12: 500 mg via INTRAVENOUS
  Filled 2022-05-12 (×3): qty 10

## 2022-05-12 MED ORDER — INSULIN ASPART 100 UNIT/ML IJ SOLN
4.0000 [IU] | INTRAMUSCULAR | Status: DC
  Administered 2022-05-12 – 2022-05-27 (×73): 4 [IU] via SUBCUTANEOUS

## 2022-05-12 MED ORDER — DEXTROSE 5 % IV SOLN
4.0000 mg | Freq: Once | INTRAVENOUS | Status: AC
  Administered 2022-05-12: 4 mg via INTRAVENOUS
  Filled 2022-05-12: qty 16

## 2022-05-12 MED ORDER — SODIUM CHLORIDE 0.9 % IV SOLN: 1.0000 g | Freq: Once | INTRAVENOUS | Status: DC

## 2022-05-12 MED ORDER — SODIUM CHLORIDE 0.9% IV SOLUTION: Freq: Once | INTRAVENOUS | Status: AC

## 2022-05-12 NOTE — Progress Notes (Signed)
eLink Physician-Brief Progress Note Patient Name: Patricia [name] DOB: [birth year] MRN: [id number]   Date of Service  05/12/2022  HPI/Events of Note  Notified of hemoglobin of 6.2 No active bleed reported by nurse.  Not on pressors. Hemodynamically stable.   eICU Interventions  Placed order to transfuse 1 unit pRBC.         [place] 05/12/2022, 6:06 AM

## 2022-05-12 NOTE — Progress Notes (Signed)
NAME:  Patricia [name], MRN:  [id number], DOB:  03-Oct-1970, LOS: 37 ADMISSION DATE:  04/23/2022, CONSULTATION DATE:  04/29/2022 REFERRING MD:  Felicie Morn, MD, CHIEF COMPLAINT:   Small bowel obstruction, aspiration event, sepsis, respiratory failure    History of Present Illness:  Ms. Koskela with above hx and nuc study in 2015 neg for ischemia done for chest pain and echo with EF 60-65% presented to hospital for excisional hernia repair (robotic hernia repair 04/23/22) with mesh, ileus post op on TNA. Developed vomiting and hypotension after NG came out on 04/29/22 and PNA on CXR, was tachycardic and BP improved with IV fluids. DX with sepsis and ABX started. At one point prolonged Qtc. Pt admitted to ICU on 10/2.  Was placed on levophed. Early AM on 10/3 was intubated for worsening metabolic/lactic acidosis. She had AKI as well. Crt was 6.01, her normal 0.9. Today Cr is 4 on CRRT.  Pertinent  Medical History  DM2 Depression OSA Migraines  Asthma Obesity Schizophrenia HTN HLD  Significant Hospital Events: Including procedures, antibiotic start and stop dates in addition to other pertinent events   9/26 Robotic hernia repair complicated by abd adhesions.  10/2 CT + for developing SBO and bilat lower lobe opacities most likely aspiration PNA. Progressive decompensation requiring transfer to ICU 10/2 PCCM consulted for respiratory failure & shock 10\3 Intubated for resp failure. CVL and arterial line placed. 04/30/22: EF 30-35%, RV moderately reduced, mild to moderate MR, IVC collapsed throughout respiratory cycle 10\4 EKG changes/trop increased--> and taken to cath lab - No obstructive CAD, but in decompensated heart failure, Bedside echo with EF 15%, new WMA, moderate RV dysfunction, trivial pericardial effusion.underwent emergent L/RHC. Normal coronaries, RA mean 13, PA mean 29, LVEDP 16, PCWP 19, CO/CI 3.96/1.87   returned to CICU post-cath. Right heart cath placed right fem.  Right IJ HD cath placed.  10/5 advanced HF team following, shock supported on norepi/vasopressin and Dobutamine. On CRRT.  10/6 weaning Norepi. Starting to wean fent. Needing restraints.  10/8: Reintubated due to neuromuscular weakness.  Bedside echo demonstrating improved RV function, LV function remained depressed 10/9 CT abdomen pelvis.suggesting wall thickening throughout the proximal and mid small bowel consistent with possible infectious or inflammatory enteritis there is also bibasilar airspace disease which could reflect pneumonia.  Antibiotic coverage widened.  Zosyn changed to meropenem.  Tolerating trickle feeds still on TPN 10/10 stopped stress dose steroids.  Resumed subcutaneous heparin. 10/13 albumin for coox decrease. Weaning vent . Failed SBT. At goal TF. 10/14 extubated to bipap failed due to weakness, trached 10/15 trial off CRRT, lasix  Interim History / Subjective:  This morning she complains of minimal pain around her tracheostomy.  Has not had much bleeding per RN from tracheostomy.  1 unit pRBCs for Hb <7.  Objective   Blood pressure (!) 122/27, pulse 98, temperature 99.9 F (37.7 C), temperature source Axillary, resp. rate 17, height '5\' 4"'$  (1.626 m), weight 108.6 kg, last menstrual period 07/07/2014, SpO2 100 %. CVP:  [0 mmHg-39 mmHg] 10 mmHg  Vent Mode: PRVC FiO2 (%):  [30 %-40 %] 40 % Set Rate:  [20 bmp] 20 bmp Vt Set:  [460 mL] 460 mL PEEP:  [5 cmH20] 5 cmH20 Pressure Support:  [10 cmH20] 10 cmH20 Plateau Pressure:  [7 cmH20-18 cmH20] 10 cmH20   Intake/Output Summary (Last 24 hours) at 05/12/2022 0907 Last data filed at 05/12/2022 0800 Gross per 24 hour  Intake 3472.63 ml  Output 2822 ml  Net  650.63 ml    Filed Weights   05/09/22 0202 05/10/22 0429 05/11/22 0500  Weight: 109.7 kg 108.7 kg 108.6 kg    Examination: General: chronically ill appearing woman lying in bed in NAD HEENT: sore on upper lip- not currently bleeding. [place]/AT  Neck: trach in  place with small amount of bleeding inferiorly Pulmonary: Breathing comfortably on PS 8/CPAP 5. Minimal bloody secretions.  Cardiac: S1S2, RRR Abdomen: obese, soft, NT Extremities: pedal edema, worse hand edema today Neuro: RASS 0, moving all extremities    Coox 71% BUN 53 Cr 2.45 ( on CRRT) WBC 15.4 H/H 6.2/19.2  Trach aspirate culture> few WBC, predominantly PMN  Resolved problems:   Assessment & Plan:   Acute metabolic encephalopathy superimposed on hx bipolar disorder, schizophrenia   ICU delirium- improved -not on bipolar meds PTA -PAD protocol- minimize sedation as able. Fentanyl PRN. -delirium precautions   Acute biventricular HFrEF (Etiology sepsis, stress induced CM. Has clean coronaries) Cardiogenic shock resolved -monitor coox daily -appreciate heart failure team's management -lasix trial today; goal euvolemia  Acute hypoxic respiratory failure Aspiration PNA Pulm edema   RLL VAP  -meropenem x 10 days -LTVV -con't weaning efforts -Routine trach care; can remove sutures on 10/21. SLP consulted. -PAD protocol -resp culture pending from 10/14  AKI with oliguria; no baseline CKD AGMA -trial off CRRT today -strict I/Os -renally dose meds, avoid nephrotoxic meds  S/p hernia repair S/p LOA Ileus  inflammatory enteritis  -con't TF at goal -stop TPN today -meropenem x 10 days -appreciate surgery's management  Suspected sepsis -- inflammatory enteritis, PNA Initially thought leukocytosis secondary to steroids, however CT imaging suggesting wall thickening throughout the proximal and mid small bowel --? possible infectious or inflammatory enteritis, as well as ? Basilar PNA  -meropenem x 10 days -resp culture 10/14 pending  Anemia, acute on chronic  -transfuse 1 unit this morning for Hb <7  -monitor for signs of bleeding  DM2 with hyperglycemia  -SSI PRN -drop TF coverage to 4 units q4h since TPN stopping -goal BG 140-180  Severe protein calorie  malnutrition  -con't TF -stopping TPN today  Pressure injury- lip -having trach in place helps relieve pressure  -wound care  Deconditioning -PT, OT, SLP  Sister Monica updated at bedside this morning.    Best Practice (right click and "Reselect all SmartList Selections" daily)   Diet/type: tubefeeds DVT prophylaxis: prophylactic heparin  GI prophylaxis: PPI Lines: Central line and Dialysis Catheter Foley:  Yes, and it is still needed Code Status:  full code  This patient is critically ill with multiple organ system failure which requires frequent high complexity decision making, assessment, support, evaluation, and titration of therapies. This was completed through the application of advanced monitoring technologies and extensive interpretation of multiple databases. During this encounter critical care time was devoted to patient care services described in this note for 40 minutes.  Julian Hy, DO 05/12/22 11:23 AM [facility]

## 2022-05-12 NOTE — Progress Notes (Signed)
Advanced Heart Failure Rounding Note  PCP-Cardiologist: None   Subjective:    -Awake, pointing to recent trach site.  -tracheostomy yesterday  -drop in Hgb overnight   Objective:   Weight Range: 108.6 kg Body mass index is 41.1 kg/m.   Vital Signs:   Temp:  [98.1 F (36.7 C)-100.5 F (38.1 C)] 99.9 F (37.7 C) (10/15 0746) Pulse Rate:  [72-119] 98 (10/15 0815) Resp:  [12-35] 17 (10/15 0815) BP: (102-125)/(27-64) 125/60 (10/15 0810) SpO2:  [51 %-100 %] 100 % (10/15 0815) Arterial Line BP: (90-166)/(43-103) 123/64 (10/15 0815) FiO2 (%):  [30 %-40 %] 40 % (10/15 0810) Last BM Date : 05/11/22  Weight change: Filed Weights   05/09/22 0202 05/10/22 0429 05/11/22 0500  Weight: 109.7 kg 108.7 kg 108.6 kg    Intake/Output:   Intake/Output Summary (Last 24 hours) at 05/12/2022 0938 Last data filed at 05/12/2022 0800 Gross per 24 hour  Intake 3472.63 ml  Output 2822 ml  Net 650.63 ml       Physical Exam   CVP 10-11 General: Intubated; sitting up awake alert HEENT: normal; trach in place Neck: supple. Thick neck, JVD not well visualized. Carotids 2+ bilat; no bruits. No lymphadenopathy or thyromegaly appreciated. Cor: PMI nondisplaced. Regular rate & rhythm. No rubs, gallops or murmurs. Lungs: mechanical lung sounds; corase Abdomen: obese, soft, nontender, nondistended. No hepatosplenomegaly. No bruits or masses. Good bowel sounds. Extremities: no cyanosis, clubbing, rash, 1+ b/l LE edema Neuro: intubated , good grip strength moving bilateral lower and upper extremities  Telemetry   SR-ST 80-100s   Labs    CBC Recent Labs    05/11/22 0445 05/11/22 1514 05/12/22 0454  WBC 21.0*  --  15.4*  HGB 7.0* 7.1* 6.2*  HCT 21.6* 21.0* 19.2*  MCV 83.7  --  84.6  PLT 173  --  193    Basic Metabolic Panel Recent Labs    05/11/22 0445 05/11/22 1514 05/11/22 1516 05/12/22 0454  NA 138   < > 138 137  K 4.4   < > 4.6 4.8  CL 105  --  106 107  CO2 24   --  24 23  GLUCOSE 142*  --  132* 154*  BUN 50*  --  49* 53*  CREATININE 2.12*  --  2.18* 2.45*  CALCIUM 7.8*  --  7.7* 8.0*  MG 2.5*  --   --  2.3  PHOS 4.5  --  4.9* 5.4*   < > = values in this interval not displayed.    Liver Function Tests  Imaging   Pertinent imaging reviewed;  Echo 05/02/22: LVEF 20%, RV function moderately reduced.   Medications:     Scheduled Medications:  sodium chloride   Intravenous Once   Chlorhexidine Gluconate Cloth  6 each Topical Daily   Chlorhexidine Gluconate Cloth  6 each Topical Q0600   docusate  100 mg Per Tube BID   feeding supplement (PROSource TF20)  60 mL Per Tube BID   heparin injection (subcutaneous)  5,000 Units Subcutaneous Q8H   insulin aspart  0-20 Units Subcutaneous Q4H   insulin aspart  4 Units Subcutaneous Q4H   multivitamin  1 tablet Per Tube QHS   mouth rinse  15 mL Mouth Rinse Q2H   pantoprazole (PROTONIX) IV  40 mg Intravenous Q24H   polyethylene glycol  17 g Per Tube Daily   sodium chloride flush  10-40 mL Intracatheter Q12H   sodium chloride flush  3 mL Intravenous  Q12H    Infusions:  sodium chloride     sodium chloride Stopped (05/04/22 2003)   sodium chloride 10 mL/hr at 05/12/22 0800   sodium chloride Stopped (05/09/22 1901)   sodium chloride     bumetanide (BUMEX) IV     dexmedetomidine (PRECEDEX) IV infusion 1.2 mcg/kg/hr (05/12/22 0800)   fentaNYL infusion INTRAVENOUS Stopped (05/11/22 1002)   fentaNYL infusion INTRAVENOUS 175 mcg/hr (05/12/22 0800)   meropenem (MERREM) IV Stopped (05/12/22 0617)   norepinephrine (LEVOPHED) Adult infusion Stopped (05/11/22 1317)   Peptamen 1.5 1,000 mL (05/11/22 1500)   TPN ADULT (ION) 35 mL/hr at 05/12/22 0800    PRN Medications: sodium chloride, sodium chloride, acetaminophen, alum & mag hydroxide-simeth, fentaNYL, fentaNYL (SUBLIMAZE) injection, fentaNYL (SUBLIMAZE) injection, HYDROmorphone (DILAUDID) injection, midazolam, midazolam, ondansetron (ZOFRAN) IV, mouth  rinse, sodium chloride flush, sodium chloride flush    Patient Profile  51 y.o. AAF with history of bipolar disorder/schizophrenia, DM II, obesity, prior abdominal surgeries. Underwent robotic incisional hernia repair on 09/26.    Course c/b post-op ilieus/obstruction followed by septic shock 2/2 aspiration PNA, AKI on CRRT and cardiogenic shock.  Assessment/Plan  Cardiogenic & Septic shock -Septic shock 2/2 aspiration PNA. Procal > 150 -Taken emergently to cath lab 10/4 d/t concern for ST elevation MI. HS troponin > 24K. Coronaries clean. CI 1.87, RA mean 13, PCWP 19, LVEDP 16.  - WBC trending down; continuing merrem. - Blood cultures 10/3 negative - On vancomycin and meropenum per CCM. - TTE w/ normal LV/RV function. Mixed venous have been stable; will repeat today.  2. Acute systolic CHF -Nonischemic secondary to stress cardiomyopathy, normal LV and RV function on repeat echocardiogram.  3. Acute MI (resolved) -ST elevation in inferolateral leads on ECG  -HS troponin > 24K at peak -Cath with normal coronaries   4. Metabolic acidosis: -Improved; continue CRRT   5.  Oliguric renal failure: -No urine output for several days. Will give trial of bumex '4mg'$  once today after PRBCs.   6. Acute hypoxic respiratory failure: -2/2 aspiration PNA -s/p trach yesterday   7. Incisional hernia: -s/p robotic repair with mesh/lysis of adhesions this admit -GSU following   8. SBO -Post-op -Receiving TPN - Loose stools; stopping stool softners.   9. Thrombocytopenia , resolved - stable, HIT negative, SRA negative.   Length of Stay: 18 Advanced Heart Failure Team Pager [phone] (M-F; 7a - 5p)  Please contact [facility] for night-coverage after hours (4p -7a ) and weekends on [email or web address]   Soham Hollett,  Advanced heart failure 9:38 AM   CRITICAL CARE Performed by: Hebert Soho  Total critical care time: 6 minutes  Critical care time was exclusive of separately  billable procedures and treating other patients.  Critical care was necessary to treat or prevent imminent or life-threatening deterioration.  Critical care was time spent personally by me on the following activities: development of treatment plan with patient and/or surrogate as well as nursing, discussions with consultants, evaluation of patient's response to treatment, examination of patient, obtaining history from patient or surrogate, ordering and performing treatments and interventions, ordering and review of laboratory studies, ordering and review of radiographic studies, pulse oximetry and re-evaluation of patient's condition.

## 2022-05-12 NOTE — Progress Notes (Signed)
Patient ID: Patricia [name], female   DOB: 21-Jun-1971, 51 y.o.   MRN: [id number] S: Failed extubation yesterday and now s/p trach on vent.  CRRT stopped and attempt for bumex challenge today per CHF team. O:BP (!) 122/27   Pulse 98   Temp 99.9 F (37.7 C) (Axillary)   Resp 17   Ht '5\' 4"'$  (1.626 m)   Wt 108.6 kg   LMP 07/07/2014 (Approximate)   SpO2 100%   BMI 41.10 kg/m   Intake/Output Summary (Last 24 hours) at 05/12/2022 0913 Last data filed at 05/12/2022 0800 Gross per 24 hour  Intake 3472.63 ml  Output 2822 ml  Net 650.63 ml   Intake/Output: I/O last 3 completed shifts: In: 5747.3 [I.V.:3360.6; Other:40; NG/GT:1905; IV Piggyback:441.7] Out: 5316 [Stool:850]  Intake/Output this shift:  Total I/O In: 302.9 [I.V.:194.7; NG/GT:50; IV Piggyback:58.2] Out: -  Weight change:  Gen:on vent via trach CVS: RRR Resp:Ventilated BS bilaterally Abd: +BS, soft, NT Ext:2+ edema of hands bilaterally legs wrapped  Recent Labs  Lab 05/09/22 0422 05/09/22 0836 05/09/22 1549 05/10/22 0403 05/10/22 1654 05/11/22 0445 05/11/22 1514 05/11/22 1516 05/12/22 0454  NA 140  --  138 138 139 138 138 138 137  K 4.2  --  4.3 4.0 4.3 4.4 4.7 4.6 4.8  CL 103  --  106 107 107 105  --  106 107  CO2 23  --  '23 25 22 24  '$ --  24 23  GLUCOSE 196*  --  139* 144* 148* 142*  --  132* 154*  BUN 43*  --  47* 51* 51* 50*  --  49* 53*  CREATININE 1.96*  --  2.15* 2.22* 2.15* 2.12*  --  2.18* 2.45*  ALBUMIN <1.5* <1.5* <1.5* <1.5* 1.6* 1.6*  --  1.6* <1.5*  CALCIUM 7.6*  --  7.1* 7.2* 7.7* 7.8*  --  7.7* 8.0*  PHOS 4.3  --  4.0 4.2 4.3 4.5  --  4.9* 5.4*  AST  --  385*  --   --   --   --   --   --   --   ALT  --  324*  --   --   --   --   --   --   --    Liver Function Tests: Recent Labs  Lab 05/09/22 0836 05/09/22 1549 05/11/22 0445 05/11/22 1516 05/12/22 0454  AST 385*  --   --   --   --   ALT 324*  --   --   --   --   ALKPHOS 216*  --   --   --   --   BILITOT 1.6*  --   --   --   --    PROT 5.2*  --   --   --   --   ALBUMIN <1.5*   < > 1.6* 1.6* <1.5*   < > = values in this interval not displayed.   No results for input(s): "LIPASE", "AMYLASE" in the last 168 hours. No results for input(s): "AMMONIA" in the last 168 hours. CBC: Recent Labs  Lab 05/06/22 0415 05/07/22 0428 05/07/22 2146 05/08/22 0426 05/09/22 0422 05/10/22 0719 05/10/22 1316 05/11/22 0445 05/11/22 1514 05/12/22 0454  WBC 43.7* 47.3*  --  33.0* 27.9* 23.5*  --  21.0*  --  15.4*  NEUTROABS 35.1* 35.8*  --   --   --   --   --   --   --   --  HGB 9.8* 8.6*   < > 7.9* 7.8* 7.4*   < > 7.0* 7.1* 6.2*  HCT 28.2* 25.5*   < > 23.4* 23.8* 22.6*   < > 21.6* 21.0* 19.2*  MCV 79.4* 81.7  --  80.4 82.9 82.8  --  83.7  --  84.6  PLT 53* 102*  --  144* 153 182  --  173  --  177   < > = values in this interval not displayed.   Cardiac Enzymes: No results for input(s): "CKTOTAL", "CKMB", "CKMBINDEX", "TROPONINI" in the last 168 hours. CBG: Recent Labs  Lab 05/11/22 1512 05/11/22 1950 05/11/22 2335 05/12/22 0502 05/12/22 0749  GLUCAP 140* 135* 131* 145* 147*    Iron Studies: No results for input(s): "IRON", "TIBC", "TRANSFERRIN", "FERRITIN" in the last 72 hours. Studies/Results: DG Chest Port 1 View  Result Date: 05/11/2022 CLINICAL DATA:  Status post tracheostomy EXAM: PORTABLE CHEST 1 VIEW COMPARISON:  Previous studies including the examination of 05/05/2022 FINDINGS: There is interval placement of tracheostomy. Tip of tracheostomy is 3.9 cm above the carina. Enteric tube is noted traversing the esophagus. Tip of right IJ central venous catheter is seen in superior vena cava. There are linear densities in the lower lung fields, more so on the left side. There are no signs of pulmonary edema or focal pulmonary consolidation. There is minimal blunting of right lateral CP angle. There is no pneumothorax. IMPRESSION: There are linear densities in the lower lung fields, more so on the left side suggesting  subsegmental atelectasis. There is possible minimal right pleural effusion. Electronically Signed   By: Elmer Picker M.D.   On: 05/11/2022 14:09   DG Abd Portable 1V  Result Date: 05/10/2022 CLINICAL DATA:  Encounter for feeding tube placement. EXAM: PORTABLE ABDOMEN - 1 VIEW COMPARISON:  CT 05/06/2022 FINDINGS: Weighted enteric tube courses along the expected location of the stomach in the duodenum. The tip is in the left upper quadrant in the region of the proximal jejunum. Paucity of upper abdominal bowel gas IMPRESSION: Weighted enteric tube with tip in the left upper quadrant in the region of the proximal jejunum. Electronically Signed   By: Keith Rake M.D.   On: 05/10/2022 14:50   ECHOCARDIOGRAM COMPLETE  Result Date: 05/10/2022    ECHOCARDIOGRAM REPORT   Patient Name:   Patricia [name] [facility] Date of Exam: 05/10/2022 Medical Rec #:  [id number]          Height:       64.0 in Accession #:    [id number]         Weight:       239.6 lb Date of Birth:  03/26/1971          BSA:          2.113 m Patient Age:    63 years           BP:           145/73 mmHg Patient Gender: F                  HR:           93 bpm. Exam Location:  Inpatient Procedure: 2D Echo, Cardiac Doppler, Color Doppler and Intracardiac            Opacification Agent Indications:    CHF  History:        Patient has prior history of Echocardiogram examinations, most  recent 05/02/2022. Arrythmias:Tachycardia; Risk                 Factors:Hypertension and Diabetes. Abnormal ECG. Bacteremia.  Sonographer:    Clayton Lefort RDCS (AE) Referring Phys: [phone] AMY D CLEGG  Sonographer Comments: Technically difficult study due to poor echo windows, echo performed with patient supine and on artificial respirator and patient is obese. Image acquisition challenging due to patient body habitus and Image acquisition challenging due to respiratory motion. IMPRESSIONS  1. Study not well visualized and limited.. Left ventricular ejection  fraction, by estimation, is 50 to 55%. The left ventricle has low normal function. The left ventricle has no regional wall motion abnormalities. Left ventricular diastolic parameters are consistent with Grade I diastolic dysfunction (impaired relaxation).  2. Right ventricular systolic function is normal. The right ventricular size is normal.  3. No evidence of mitral valve regurgitation.  4. Aortic valve regurgitation is not visualized.  5. The inferior vena cava is normal in size with greater than 50% respiratory variability, suggesting right atrial pressure of 3 mmHg. Conclusion(s)/Recommendation(s): Study is not well visualized. EF appears improved from prior study 05/02/2022. FINDINGS  Left Ventricle: Study not well visualized and limited. Left ventricular ejection fraction, by estimation, is 50 to 55%. The left ventricle has low normal function. The left ventricle has no regional wall motion abnormalities. Definity contrast agent was  given IV to delineate the left ventricular endocardial borders. The left ventricular internal cavity size was normal in size. There is borderline left ventricular hypertrophy. Left ventricular diastolic parameters are consistent with Grade I diastolic dysfunction (impaired relaxation). Right Ventricle: The right ventricular size is normal. Right ventricular systolic function is normal. Left Atrium: Left atrial size was normal in size. Right Atrium: Right atrial size was normal in size. Pericardium: There is no evidence of pericardial effusion. Mitral Valve: No evidence of mitral valve regurgitation. Aortic Valve: Aortic valve regurgitation is not visualized. Aortic valve mean gradient measures 7.0 mmHg. Aortic valve peak gradient measures 12.8 mmHg. Aortic valve area, by VTI measures 3.38 cm. Aorta: The aortic root is normal in size and structure. Venous: The inferior vena cava is normal in size with greater than 50% respiratory variability, suggesting right atrial pressure of 3  mmHg.  LEFT VENTRICLE PLAX 2D LVIDd:         4.40 cm   Diastology LVIDs:         3.60 cm   LV e' medial:    6.74 cm/s LV PW:         1.10 cm   LV E/e' medial:  11.7 LV IVS:        1.00 cm   LV e' lateral:   12.20 cm/s LVOT diam:     2.40 cm   LV E/e' lateral: 6.4 LV SV:         87 LV SV Index:   41 LVOT Area:     4.52 cm  RIGHT VENTRICLE RV Basal diam:  2.40 cm RV S prime:     18.60 cm/s TAPSE (M-mode): 2.1 cm LEFT ATRIUM             Index        RIGHT ATRIUM          Index LA diam:        3.20 cm 1.51 cm/m   RA Area:     9.71 cm LA Vol (A2C):   49.1 ml 23.24 ml/m  RA Volume:   18.00 ml 8.52  ml/m LA Vol (A4C):   35.4 ml 16.76 ml/m LA Biplane Vol: 41.9 ml 19.83 ml/m  AORTIC VALVE AV Area (Vmax):    3.44 cm AV Area (Vmean):   3.32 cm AV Area (VTI):     3.38 cm AV Vmax:           179.00 cm/s AV Vmean:          118.000 cm/s AV VTI:            0.258 m AV Peak Grad:      12.8 mmHg AV Mean Grad:      7.0 mmHg LVOT Vmax:         136.00 cm/s LVOT Vmean:        86.500 cm/s LVOT VTI:          0.193 m LVOT/AV VTI ratio: 0.75  AORTA Ao Root diam: 3.80 cm MITRAL VALVE MV Area (PHT): 2.61 cm    SHUNTS MV Decel Time: 291 msec    Systemic VTI:  0.19 m MV E velocity: 78.60 cm/s  Systemic Diam: 2.40 cm MV A velocity: 82.80 cm/s MV E/A ratio:  0.95 Landscape architect signed by Phineas Inches Signature Date/Time: 05/10/2022/9:28:37 AM    Final     sodium chloride   Intravenous Once   Chlorhexidine Gluconate Cloth  6 each Topical Daily   docusate  100 mg Per Tube BID   feeding supplement (PROSource TF20)  60 mL Per Tube BID   heparin injection (subcutaneous)  5,000 Units Subcutaneous Q8H   insulin aspart  0-20 Units Subcutaneous Q4H   insulin aspart  4 Units Subcutaneous Q4H   multivitamin  1 tablet Per Tube QHS   mouth rinse  15 mL Mouth Rinse Q2H   pantoprazole (PROTONIX) IV  40 mg Intravenous Q24H   polyethylene glycol  17 g Per Tube Daily   sodium chloride flush  10-40 mL Intracatheter Q12H   sodium  chloride flush  3 mL Intravenous Q12H    BMET    Component Value Date/Time   NA 137 05/12/2022 0454   NA 139 07/02/2014 1923   K 4.8 05/12/2022 0454   K 3.9 07/02/2014 1923   CL 107 05/12/2022 0454   CL 105 07/02/2014 1923   CO2 23 05/12/2022 0454   CO2 26 07/02/2014 1923   GLUCOSE 154 (H) 05/12/2022 0454   GLUCOSE 95 07/02/2014 1923   BUN 53 (H) 05/12/2022 0454   BUN 7 07/02/2014 1923   CREATININE 2.45 (H) 05/12/2022 0454   CREATININE 0.98 07/02/2014 1923   CALCIUM 8.0 (L) 05/12/2022 0454   CALCIUM 8.5 07/02/2014 1923   GFRNONAA 23 (L) 05/12/2022 0454   GFRNONAA >60 07/02/2014 1923   GFRAA >60 04/22/2018 2215   GFRAA >60 07/02/2014 1923   CBC    Component Value Date/Time   WBC 15.4 (H) 05/12/2022 0454   RBC 2.27 (L) 05/12/2022 0454   HGB 6.2 (LL) 05/12/2022 0454   HGB 9.4 (L) 07/02/2014 1923   HCT 19.2 (L) 05/12/2022 0454   HCT 30.3 (L) 07/02/2014 1923   PLT 177 05/12/2022 0454   PLT 449 (H) 07/02/2014 1923   MCV 84.6 05/12/2022 0454   MCV 71 (L) 07/02/2014 1923   MCH 27.3 05/12/2022 0454   MCHC 32.3 05/12/2022 0454   RDW 16.6 (H) 05/12/2022 0454   RDW 18.8 (H) 07/02/2014 1923   LYMPHSABS 2.0 05/07/2022 0428   MONOABS 2.4 (H) 05/07/2022 0428   EOSABS 0.0 05/07/2022 0428  BASOSABS 0.1 05/07/2022 0428   Assessment/Plan:   AKI - likely ATN in the setting of cardiogenic and septic shock.  Started on CRRT 05/01/22 after RIJ temp HD catheter placed.  CRRT stopped 05/11/22.  Off pressors and would like to attempt IHD tomorrow.  Ok to try bumex challenge and see if her UOP improves.   Avoid nephrotoxic medications including NSAIDs and iodinated intravenous contrast exposure unless the latter is absolutely indicated.   Preferred narcotic agents for pain control are hydromorphone, fentanyl, and methadone. Morphine should not be used.  Avoid Baclofen and avoid oral sodium phosphate and magnesium citrate based laxatives / bowel preps.  Continue strict Input and Output  monitoring. Will monitor the patient closely with you and intervene or adjust therapy as indicated by changes in clinical status/labs  Vascular access - will need another HD catheter as present one has been in since 05/01/22.  Will ask PCCM to consider new location for HD catheter early this week. Cardiogenic/septic shock - weaned off levophed, on dobutamine.  Heart failure team following. Likely due to aspiration pneumonia. Acute hypoxic respiratory failure - was re-intubated 05/05/22.  Extubated again on 10/14 but failed BiPAP, s/p trach and back on Vent 05/11/22 per PCCM. Infectious enteritis - seen on CT scan.  WBC continues to climb.  Plan per PCCM. Anemia of critical illness - transfuse for Hgb <7. Acute MI - normal coronaries by cath [facility] - improved with CRRT. Thrombocytopenia - improving.  S/p robotic incisional hernia repair with mesh and LOA - Surgery following.     Donetta Potts, MD [facility]

## 2022-05-12 NOTE — Progress Notes (Signed)
SLP Cancellation Note  Patient Details Name: Patricia [name] MRN: [id number] DOB: 13-Aug-1970   Cancelled treatment:       Reason Eval/Treat Not Completed: Other (comment).  Patient s/p tracheostomy 10/14, remains on vent but per RN weaning well. Vocal quality reported to be very hoarse when attempting extubation on 10/14. Will plan to follow along via chart review. If continues to wean well, will attempt PMV when on trach collar and then proceed with swallow evaluation.   Gabriel Rainwater MA, CCC-SLP    Patricia [name] 05/12/2022, 10:44 AM

## 2022-05-12 NOTE — Progress Notes (Signed)
RT NOTE: patient placed on CPAP/PSV of 8/5 at 0810.  Currently tolerating well.  Will continue to monitor and wean as tolerated.

## 2022-05-12 NOTE — Evaluation (Signed)
Physical Therapy Evaluation Patient Details Name: Patricia [name] MRN: [id number] DOB: 1970-09-06 Today's Date: 05/12/2022  History of Present Illness  Presented to hospital for excisional hernia repair (robotic hernia repair 04/23/22) with mesh, ileus post op on TNA. Developed vomiting and hypotension after NG came out on 04/29/22 and PNA on CXR, was tachycardic and BP improved with IV fluids. DX with sepsis and ABX started. Pt admitted to ICU on 10/2.  Was placed on levophed. Early AM on 10/3 was intubated for worsening metabolic/lactic acidosis. 10/4 cardiac cath with EF 15%; She had AKI as well. Started CRRT. Extubation 10/14 failed and underwent tracheostomy.  Clinical Impression   Pt admitted secondary to problem above with deficits below. PTA patient was living with younger sister and completely independent and not using assistive devices.  Pt currently requires total assist for all mobility. She is able to activate each muscle group UEs and LEs as assessed and follow commands for ROM exercises (even able to take some resistance to some muscle groups). Anticipate slow progress due to amount of time she has been immobile due to critical illness. Hopeful she will progress to ability to pursue AIR for additional rehab.  Anticipate patient will benefit from PT to address problems listed below.Will continue to follow acutely to maximize functional mobility independence and safety.          Recommendations for follow up therapy are one component of a multi-disciplinary discharge planning process, led by the attending physician.  Recommendations may be updated based on patient status, additional functional criteria and insurance authorization.  Follow Up Recommendations Acute inpatient rehab (3hours/day)      Assistance Recommended at Discharge Frequent or constant Supervision/Assistance  Patient can return home with the following  Two people to help with walking and/or transfers;Two people to  help with bathing/dressing/bathroom;Assistance with cooking/housework;Assistance with feeding;Direct supervision/assist for medications management;Direct supervision/assist for financial management;Assist for transportation;Help with stairs or ramp for entrance    Equipment Recommendations Other (comment) (TBD if no further rehab after acute)  Recommendations for Other Services  OT consult    Functional Status Assessment Patient has had a recent decline in their functional status and demonstrates the ability to make significant improvements in function in a reasonable and predictable amount of time.     Precautions / Restrictions Precautions Precautions: Fall Precaution Comments: new trach, multiple ICU lines/monitors      Mobility  Bed Mobility Overal bed mobility: Needs Assistance Bed Mobility: Rolling Rolling: Max assist, Total assist         General bed mobility comments: to left, total assist with inability to even reach RUE across body; to right, max assist with pt able to reach to rail (with min assist) and hold rail to assist    Transfers                        Ambulation/Gait                  Stairs            Wheelchair Mobility    Modified Rankin (Stroke Patients Only)       Balance Overall balance assessment:  (TBA with +2 assist)                                           Pertinent Vitals/Pain Pain Assessment Pain  Assessment: Faces Faces Pain Scale: No hurt    Home Living Family/patient expects to be discharged to:: Private residence Living Arrangements: Other relatives (lives with younger sister) Available Help at Discharge: Family Type of Home: Apartment Home Access: Stairs to enter Entrance Stairs-Rails: None Technical brewer of Steps: 2   Home Layout: One level Home Equipment: None Additional Comments: sister present and able to provide information    Prior Function Prior Level of Function  : Independent/Modified Independent             Mobility Comments: sister present and able to provide information       Hand Dominance   Dominant Hand: Left    Extremity/Trunk Assessment   Upper Extremity Assessment Upper Extremity Assessment: Generalized weakness (bil UE edema; UEs up on pillows)    Lower Extremity Assessment Lower Extremity Assessment: RLE deficits/detail;LLE deficits/detail RLE Deficits / Details: hip flexion 2+, hip extension ~3+, knee extension 2+, ankle DF 3 RLE Coordination: WNL LLE Deficits / Details: hip flexion 2+, hip extension ~3+, knee extension 2+, ankle DF 3 LLE Coordination: WNL    Cervical / Trunk Assessment Cervical / Trunk Assessment: Other exceptions Cervical / Trunk Exceptions: obese, ventral hernia repair 9/26  Communication   Communication: Tracheostomy  Cognition Arousal/Alertness: Lethargic, Suspect due to medications (but able to follow simple commands) Behavior During Therapy: Flat affect Overall Cognitive Status: Difficult to assess                                 General Comments: had fentanyl bolus just prior to PT arriving due to coughing spell; able to maintain eyes open most of session and followed all simple commands        General Comments General comments (skin integrity, edema, etc.): Sister present and very encouraging.    Exercises General Exercises - Upper Extremity Shoulder Flexion: AAROM, Both, 5 reps Shoulder Extension: AROM, Both, 5 reps Elbow Flexion: AROM, Strengthening, Both, 5 reps Elbow Extension: AROM, Strengthening, Both, 5 reps Digit Composite Flexion: AROM, Both, 5 reps Composite Extension: AROM, Both, 5 reps General Exercises - Lower Extremity Ankle Circles/Pumps: AROM, Both, 5 reps (followed by bil Heel cord stretches with ROM WFL) Quad Sets: AROM, Both, 5 reps Heel Slides: AAROM, Both, 5 reps Hip ABduction/ADduction: AAROM, Both, 5 reps   Assessment/Plan    PT Assessment  Patient needs continued PT services  PT Problem List Decreased strength;Decreased range of motion;Decreased activity tolerance;Decreased balance;Decreased mobility;Decreased knowledge of use of DME;Cardiopulmonary status limiting activity;Obesity       PT Treatment Interventions DME instruction;Gait training;Functional mobility training;Therapeutic activities;Therapeutic exercise;Balance training;Neuromuscular re-education;Patient/family education    PT Goals (Current goals can be found in the Care Plan section)  Acute Rehab PT Goals Patient Stated Goal: unable to state; nods head to wanting to get stronger PT Goal Formulation: Patient unable to participate in goal setting Time For Goal Achievement: 05/26/22 Potential to Achieve Goals: Good    Frequency Min 3X/week     Co-evaluation               AM-PAC PT "6 Clicks" Mobility  Outcome Measure Help needed turning from your back to your side while in a flat bed without using bedrails?: Total Help needed moving from lying on your back to sitting on the side of a flat bed without using bedrails?: Total Help needed moving to and from a bed to a chair (including a wheelchair)?: Total Help  needed standing up from a chair using your arms (e.g., wheelchair or bedside chair)?: Total Help needed to walk in hospital room?: Total Help needed climbing 3-5 steps with a railing? : Total 6 Click Score: 6    End of Session Equipment Utilized During Treatment: Oxygen (on vent, rest mode) Activity Tolerance: Patient tolerated treatment well Patient left: in bed;with family/visitor present Nurse Communication: Other (comment) (able to activate all 4 and follow simple commands; will need +2 for further mobility assessment) PT Visit Diagnosis: Muscle weakness (generalized) (M62.81);Difficulty in walking, not elsewhere classified (R26.2)    Time: 1350-1416 PT Time Calculation (min) (ACUTE ONLY): 26 min   Charges:   PT Evaluation $PT Eval  Moderate Complexity: 1 Mod PT Treatments $Therapeutic Exercise: 8-22 mins         Arby Barrette, PT Acute Rehabilitation Services  Office [phone]   Rexanne Mano 05/12/2022, 2:31 PM

## 2022-05-12 NOTE — Progress Notes (Signed)
11 Days Post-Op   Subjective/Chief Complaint: Events of yesterday noted - now with trach on vent. Awake and following commands. Some abdominal pain yesterday but tolerating Tfs. Loose Bms.  Objective: Vital signs in last 24 hours: Temp:  [98.1 F (36.7 C)-100.5 F (38.1 C)] 99.9 F (37.7 C) (10/15 0746) Pulse Rate:  [72-119] 108 (10/15 0600) Resp:  [12-35] 24 (10/15 0600) BP: (102-123)/(27-64) 122/27 (10/14 2333) SpO2:  [51 %-100 %] 100 % (10/15 0600) Arterial Line BP: (90-166)/(43-103) 132/72 (10/15 0600) FiO2 (%):  [30 %-40 %] 40 % (10/15 0400) Last BM Date : 05/11/22  Intake/Output from previous day: 10/14 0701 - 10/15 0700 In: 3433.5 [I.V.:2141.8; NG/GT:1050; IV Piggyback:241.7] Out: 3116 [Stool:400] Intake/Output this shift: No intake/output data recorded.  Exam: Sitting up in bed Trach, on vent Follows commands Abdomen soft, obese, mild TTP. Incisions c/d/i  Lab Results:  Recent Labs    05/11/22 0445 05/11/22 1514 05/12/22 0454  WBC 21.0*  --  15.4*  HGB 7.0* 7.1* 6.2*  HCT 21.6* 21.0* 19.2*  PLT 173  --  177    BMET Recent Labs    05/11/22 1516 05/12/22 0454  NA 138 137  K 4.6 4.8  CL 106 107  CO2 24 23  GLUCOSE 132* 154*  BUN 49* 53*  CREATININE 2.18* 2.45*  CALCIUM 7.7* 8.0*    PT/INR No results for input(s): "LABPROT", "INR" in the last 72 hours. ABG Recent Labs    05/11/22 1514  PHART 7.434  HCO3 23.8    Studies/Results: DG Chest Port 1 View  Result Date: 05/11/2022 CLINICAL DATA:  Status post tracheostomy EXAM: PORTABLE CHEST 1 VIEW COMPARISON:  Previous studies including the examination of 05/05/2022 FINDINGS: There is interval placement of tracheostomy. Tip of tracheostomy is 3.9 cm above the carina. Enteric tube is noted traversing the esophagus. Tip of right IJ central venous catheter is seen in superior vena cava. There are linear densities in the lower lung fields, more so on the left side. There are no signs of pulmonary  edema or focal pulmonary consolidation. There is minimal blunting of right lateral CP angle. There is no pneumothorax. IMPRESSION: There are linear densities in the lower lung fields, more so on the left side suggesting subsegmental atelectasis. There is possible minimal right pleural effusion. Electronically Signed   By: Elmer Picker M.D.   On: 05/11/2022 14:09   DG Abd Portable 1V  Result Date: 05/10/2022 CLINICAL DATA:  Encounter for feeding tube placement. EXAM: PORTABLE ABDOMEN - 1 VIEW COMPARISON:  CT 05/06/2022 FINDINGS: Weighted enteric tube courses along the expected location of the stomach in the duodenum. The tip is in the left upper quadrant in the region of the proximal jejunum. Paucity of upper abdominal bowel gas IMPRESSION: Weighted enteric tube with tip in the left upper quadrant in the region of the proximal jejunum. Electronically Signed   By: Keith Rake M.D.   On: 05/10/2022 14:50   ECHOCARDIOGRAM COMPLETE  Result Date: 05/10/2022    ECHOCARDIOGRAM REPORT   Patient Name:   Patricia [name] [facility] Date of Exam: 05/10/2022 Medical Rec #:  [id number]          Height:       64.0 in Accession #:    [id number]         Weight:       239.6 lb Date of Birth:  [birth year]          BSA:  2.113 m Patient Age:    51 years           BP:           145/73 mmHg Patient Gender: F                  HR:           93 bpm. Exam Location:  Inpatient Procedure: 2D Echo, Cardiac Doppler, Color Doppler and Intracardiac            Opacification Agent Indications:    CHF  History:        Patient has prior history of Echocardiogram examinations, most                 recent 05/02/2022. Arrythmias:Tachycardia; Risk                 Factors:Hypertension and Diabetes. Abnormal ECG. Bacteremia.  Sonographer:    Clayton Lefort RDCS (AE) Referring Phys: [phone] AMY D CLEGG  Sonographer Comments: Technically difficult study due to poor echo windows, echo performed with patient supine and on artificial respirator and  patient is obese. Image acquisition challenging due to patient body habitus and Image acquisition challenging due to respiratory motion. IMPRESSIONS  1. Study not well visualized and limited.. Left ventricular ejection fraction, by estimation, is 50 to 55%. The left ventricle has low normal function. The left ventricle has no regional wall motion abnormalities. Left ventricular diastolic parameters are consistent with Grade I diastolic dysfunction (impaired relaxation).  2. Right ventricular systolic function is normal. The right ventricular size is normal.  3. No evidence of mitral valve regurgitation.  4. Aortic valve regurgitation is not visualized.  5. The inferior vena cava is normal in size with greater than 50% respiratory variability, suggesting right atrial pressure of 3 mmHg. Conclusion(s)/Recommendation(s): Study is not well visualized. EF appears improved from prior study 05/02/2022. FINDINGS  Left Ventricle: Study not well visualized and limited. Left ventricular ejection fraction, by estimation, is 50 to 55%. The left ventricle has low normal function. The left ventricle has no regional wall motion abnormalities. Definity contrast agent was  given IV to delineate the left ventricular endocardial borders. The left ventricular internal cavity size was normal in size. There is borderline left ventricular hypertrophy. Left ventricular diastolic parameters are consistent with Grade I diastolic dysfunction (impaired relaxation). Right Ventricle: The right ventricular size is normal. Right ventricular systolic function is normal. Left Atrium: Left atrial size was normal in size. Right Atrium: Right atrial size was normal in size. Pericardium: There is no evidence of pericardial effusion. Mitral Valve: No evidence of mitral valve regurgitation. Aortic Valve: Aortic valve regurgitation is not visualized. Aortic valve mean gradient measures 7.0 mmHg. Aortic valve peak gradient measures 12.8 mmHg. Aortic valve  area, by VTI measures 3.38 cm. Aorta: The aortic root is normal in size and structure. Venous: The inferior vena cava is normal in size with greater than 50% respiratory variability, suggesting right atrial pressure of 3 mmHg.  LEFT VENTRICLE PLAX 2D LVIDd:         4.40 cm   Diastology LVIDs:         3.60 cm   LV e' medial:    6.74 cm/s LV PW:         1.10 cm   LV E/e' medial:  11.7 LV IVS:        1.00 cm   LV e' lateral:   12.20 cm/s LVOT diam:  2.40 cm   LV E/e' lateral: 6.4 LV SV:         87 LV SV Index:   41 LVOT Area:     4.52 cm  RIGHT VENTRICLE RV Basal diam:  2.40 cm RV S prime:     18.60 cm/s TAPSE (M-mode): 2.1 cm LEFT ATRIUM             Index        RIGHT ATRIUM          Index LA diam:        3.20 cm 1.51 cm/m   RA Area:     9.71 cm LA Vol (A2C):   49.1 ml 23.24 ml/m  RA Volume:   18.00 ml 8.52 ml/m LA Vol (A4C):   35.4 ml 16.76 ml/m LA Biplane Vol: 41.9 ml 19.83 ml/m  AORTIC VALVE AV Area (Vmax):    3.44 cm AV Area (Vmean):   3.32 cm AV Area (VTI):     3.38 cm AV Vmax:           179.00 cm/s AV Vmean:          118.000 cm/s AV VTI:            0.258 m AV Peak Grad:      12.8 mmHg AV Mean Grad:      7.0 mmHg LVOT Vmax:         136.00 cm/s LVOT Vmean:        86.500 cm/s LVOT VTI:          0.193 m LVOT/AV VTI ratio: 0.75  AORTA Ao Root diam: 3.80 cm MITRAL VALVE MV Area (PHT): 2.61 cm    SHUNTS MV Decel Time: 291 msec    Systemic VTI:  0.19 m MV E velocity: 78.60 cm/s  Systemic Diam: 2.40 cm MV A velocity: 82.80 cm/s MV E/A ratio:  0.95 Mary Scientist, physiological signed by Phineas Inches Signature Date/Time: 05/10/2022/9:28:37 AM    Final     Anti-infectives: Anti-infectives (From admission, onward)    Start     Dose/Rate Route Frequency Ordered Stop   05/06/22 1530  meropenem (MERREM) 1 g in sodium chloride 0.9 % 100 mL IVPB        1 g 200 mL/hr over 30 Minutes Intravenous Every 8 hours 05/06/22 1438 05/16/22 1359   05/04/22 1000  vancomycin (VANCOCIN) IVPB 1000 mg/200 mL premix  Status:   Discontinued        1,000 mg 200 mL/hr over 60 Minutes Intravenous Every 24 hours 05/03/22 0802 05/08/22 0913   05/03/22 0845  vancomycin (VANCOREADY) IVPB 2000 mg/400 mL        2,000 mg 200 mL/hr over 120 Minutes Intravenous  Once 05/03/22 0751 05/03/22 1114   05/01/22 1800  piperacillin-tazobactam (ZOSYN) IVPB 3.375 g  Status:  Discontinued        3.375 g 12.5 mL/hr over 240 Minutes Intravenous Every 6 hours 05/01/22 1426 05/01/22 1427   05/01/22 1515  piperacillin-tazobactam (ZOSYN) IVPB 3.375 g  Status:  Discontinued        3.375 g 100 mL/hr over 30 Minutes Intravenous Every 6 hours 05/01/22 1428 05/06/22 1438   04/30/22 1400  piperacillin-tazobactam (ZOSYN) IVPB 2.25 g  Status:  Discontinued        2.25 g 100 mL/hr over 30 Minutes Intravenous Every 8 hours 04/30/22 1325 05/01/22 1426   04/29/22 1530  piperacillin-tazobactam (ZOSYN) IVPB 3.375 g  Status:  Discontinued  3.375 g 12.5 mL/hr over 240 Minutes Intravenous Every 8 hours 04/29/22 1433 04/30/22 1325   04/29/22 1145  metroNIDAZOLE (FLAGYL) IVPB 500 mg  Status:  Discontinued        500 mg 100 mL/hr over 60 Minutes Intravenous Every 12 hours 04/29/22 1047 04/29/22 1433   04/29/22 1145  vancomycin (VANCOCIN) IVPB 1000 mg/200 mL premix  Status:  Discontinued        1,000 mg 200 mL/hr over 60 Minutes Intravenous  Once 04/29/22 1047 04/29/22 1055   04/29/22 1145  vancomycin (VANCOREADY) IVPB 1500 mg/300 mL  Status:  Discontinued        1,500 mg 150 mL/hr over 120 Minutes Intravenous Every 24 hours 04/29/22 1055 04/29/22 1357   04/29/22 1045  ceFEPIme (MAXIPIME) 2 g in sodium chloride 0.9 % 100 mL IVPB  Status:  Discontinued        2 g 200 mL/hr over 30 Minutes Intravenous Every 8 hours 04/29/22 0954 04/29/22 1433   04/23/22 0730  ceFAZolin (ANCEF) IVPB 2g/100 mL premix        2 g 200 mL/hr over 30 Minutes Intravenous On call to O.R. 04/23/22 0726 04/23/22 8938       Assessment/Plan: Ms. Cizek is s/p robotic  incisional hernia repair on 04/23/22 -Postop ileus complicated by aspiration: Ileus resolved, having bowel function.  Tolerating tube feeds at goal -weaning TNA off Abdomen fairly benign on exam. Loose stools - hold laxatives   Cardiogenic/septic shock -Antibiotics per CCM -cath 10/4 - no obstructive CAD   AKI -CRRT.  Creatinine stable   Resp failure -Vent management per CCM. Failed extubation yesterday, trach placement yesterday   Winferd Humphrey, [facility] Surgery 05/12/2022, 8:00 AM Please see Amion for pager number during day hours 7:00am-4:30pm

## 2022-05-12 NOTE — Progress Notes (Signed)
PHARMACY - TOTAL PARENTERAL NUTRITION CONSULT NOTE  Indication: Small bowel obstruction and ileus   Patient Measurements: Height: '5\' 4"'  (162.6 cm) Weight: 108.6 kg (239 lb 6.7 oz) IBW/kg (Calculated) : 54.7 TPN AdjBW (KG): 68.2 Body mass index is 41.1 kg/m.  Assessment:  51 yo female s/p difficult incisional hernia repair with long LoA on 9/26.  Patient developed a SBO based on CT scan 10/2.  Poor PO intake since surgery due to abdominal pain.  Pharmacy consulted to manage TPN.  Patient was extubated and then re-intubated on 10/08 due to respiratory distress. Initiated on TF on 10/09.  Glucose / Insulin: A1c 6.3% - CBGs 111-142 Used 12 units rSSI (down), Novolog 5 units Q4H, 25 units in TPN (decr 10/13) Electrolytes: K 4.8, CoCa 9.7, Mag 2.3, others WNL Renal: CRRT started 10/04, BUN 53 Hepatic: (10/12) AST/ALT up 385/325, alk phos up to 216, tbili 1.6, TG 180 Intake / Output; MIVF: anuric, stool 550 mL, CRRT 3.5L, net +16.6L  GI Imaging: 10/3 CT: continued SBO  10/2 CT: SBO 10/9 CT: infectious or inflammatory enteritis GI Surgeries / Procedures: none since TPN initiation   Central access: CVC triple lumen placed 05/04/22 TPN start date: 04/29/22  Nutritional Goals: RD Estimated Needs Total Energy Estimated Needs: 1800-2000 kcal/d Total Protein Estimated Needs: 110-130 grams Total Fluid Estimated Needs: 1.8-2L/d  Current Nutrition:  TPN TF (Vital/Peptamen 1.5) @ goal rate 26m/hr  Plan:  Complete TPN today once 10/14 bag finishes at 18:00 Continue Novolog 5 units + resistant SSI Q4H per CCM Renal function panel BID and daily Mg while on CRRT per Renal   Thank you for allowing pharmacy to be a part of this patient's care.  AArdyth Harps PharmD Clinical Pharmacist

## 2022-05-12 NOTE — Progress Notes (Addendum)
Pharmacy Antibiotic Note  Patricia [name] is a 51 y.o. female admitted on 04/23/2022 with sepsis.  Pharmacy has been consulted for meropenem dosing.  Pt presented for hernia repair, now with resolved cardiogenic and septic shock s/p inotropes and vasopressors. Pt remains on mechanical ventilation s/p trach on 10/14. Pt has acute renal failure requiring CRRT, however CRRT stopped today (10/15). WBC continues to decrease on meropenem after stopping steroids to 15.4 today and pt remains afebrile. Will monitor temp off CRRT. CT 10/9 showed RLL PNA and possibly infectious enteritis. Per discussion with CCM today, will continue meropenem through 10/19 per CCM. Will monitor for transition to iHD and in meantime adjust abx dosing to dosing without iHD.  Plan: Adjust meropenem to 500 mg q12h with unknown current renal function, set to end 10/19 Continue to monitor plans for iHD vs renal recovery with diuresis F/u signs and symptoms of infection and cultures  Height: '5\' 4"'$  (162.6 cm) Weight: 108.6 kg (239 lb 6.7 oz) IBW/kg (Calculated) : 54.7  Temp (24hrs), Avg:99.1 F (37.3 C), Min:98.1 F (36.7 C), Max:100.5 F (38.1 C)  Recent Labs  Lab 05/08/22 0426 05/08/22 1846 05/09/22 0422 05/09/22 1549 05/10/22 0403 05/10/22 0719 05/10/22 1654 05/11/22 0445 05/11/22 1516 05/12/22 0454  WBC 33.0*  --  27.9*  --   --  23.5*  --  21.0*  --  15.4*  CREATININE 2.04*   < > 1.96*   < > 2.22*  --  2.15* 2.12* 2.18* 2.45*   < > = values in this interval not displayed.     Estimated Creatinine Clearance: 32.4 mL/min (A) (by C-G formula based on SCr of 2.45 mg/dL (H)).    Allergies  Allergen Reactions   Iodinated Contrast Media Shortness Of Breath, Other (See Comments) and Cough    Throat itching    Peanut-Containing Drug Products Anaphylaxis    Peanut Oil Only.  Can eat peanuts with no problems   Oxycodone-Acetaminophen Itching    Antimicrobials this admission: Zosyn 10/4 >> 10/9  Vanc 10/6  >> 10/10 Meropenem 10/9 >>  Dose adjustments this admission: Meropenem to 500 q12h on 10/15  Microbiology results: 10/2 BCx: NG 10/3 BCxL NG 10/2 MRSA PCR: neg 10/9 Sputum cx: NG 10/14 Resp cx: NGTD  Thank you for allowing pharmacy to participate in this patient's care.  Reatha Harps, PharmD PGY2 Pharmacy Resident 05/12/2022 9:30 AM Check [email or web address] for unit specific pharmacy number

## 2022-05-12 NOTE — Progress Notes (Signed)
Inpatient Rehab Admissions Coordinator:   Per therapy recommendation, patient was screened for CIR candidacy by Clemens Catholic, MS, CCC-SLP. At this time, Pt. is not yet tolerating OOB and not at a level to tolerate the intensity of CIR. However,  Pt. may have potential to progress to becoming a potential CIR candidate, so CIR admissions team will follow and monitor for progress and participation with therapies and place consult order if Pt. appears to be an appropriate candidate. Please contact me with any questions.   Clemens Catholic, [place], [place] Admissions Coordinator  [phone] ([place]) [phone] (office)

## 2022-05-13 DIAGNOSIS — R570 Cardiogenic shock: Secondary | ICD-10-CM | POA: Diagnosis not present

## 2022-05-13 DIAGNOSIS — R6521 Severe sepsis with septic shock: Secondary | ICD-10-CM | POA: Diagnosis not present

## 2022-05-13 DIAGNOSIS — A419 Sepsis, unspecified organism: Secondary | ICD-10-CM | POA: Diagnosis not present

## 2022-05-13 LAB — IRON AND TIBC
Iron: 20 ug/dL — ABNORMAL LOW (ref 28–170)
Saturation Ratios: 8 % — ABNORMAL LOW (ref 10.4–31.8)
TIBC: 245 ug/dL — ABNORMAL LOW (ref 250–450)
UIBC: 225 ug/dL

## 2022-05-13 LAB — GLUCOSE, CAPILLARY
Glucose-Capillary: 109 mg/dL — ABNORMAL HIGH (ref 70–99)
Glucose-Capillary: 133 mg/dL — ABNORMAL HIGH (ref 70–99)
Glucose-Capillary: 113 mg/dL — ABNORMAL HIGH (ref 70–99)
Glucose-Capillary: 135 mg/dL — ABNORMAL HIGH (ref 70–99)
Glucose-Capillary: 123 mg/dL — ABNORMAL HIGH (ref 70–99)
Glucose-Capillary: 131 mg/dL — ABNORMAL HIGH (ref 70–99)

## 2022-05-13 LAB — COOXEMETRY PANEL
Carboxyhemoglobin: 2.1 % — ABNORMAL HIGH (ref 0.5–1.5)
Methemoglobin: <0.7 % (ref 0.0–1.5)
O2 Saturation: 81.5 %
Total hemoglobin: 6.9 g/dL — CL (ref 12.0–16.0)

## 2022-05-13 LAB — CBC
HCT: 21.6 % — ABNORMAL LOW (ref 36.0–46.0)
HCT: 26.4 % — ABNORMAL LOW (ref 36.0–46.0)
Hemoglobin: 6.8 g/dL — CL (ref 12.0–15.0)
Hemoglobin: 8.8 g/dL — ABNORMAL LOW (ref 12.0–15.0)
MCH: 26.9 pg (ref 26.0–34.0)
MCH: 28.5 pg (ref 26.0–34.0)
MCHC: 31.5 g/dL (ref 30.0–36.0)
MCHC: 33.3 g/dL (ref 30.0–36.0)
MCV: 85.4 fL (ref 80.0–100.0)
MCV: 85.4 fL (ref 80.0–100.0)
Platelets: 189 10*3/uL (ref 150–400)
Platelets: 197 10*3/uL (ref 150–400)
RBC: 2.53 MIL/uL — ABNORMAL LOW (ref 3.87–5.11)
RBC: 3.09 MIL/uL — ABNORMAL LOW (ref 3.87–5.11)
RDW: 15.9 % — ABNORMAL HIGH (ref 11.5–15.5)
RDW: 16.5 % — ABNORMAL HIGH (ref 11.5–15.5)
WBC: 12.9 10*3/uL — ABNORMAL HIGH (ref 4.0–10.5)
WBC: 13.4 10*3/uL — ABNORMAL HIGH (ref 4.0–10.5)
nRBC: 0.2 % (ref 0.0–0.2)
nRBC: 0 % (ref 0.0–0.2)

## 2022-05-13 LAB — RENAL FUNCTION PANEL
Albumin: <1.5 g/dL — ABNORMAL LOW (ref 3.5–5.0)
Albumin: 1.8 g/dL — ABNORMAL LOW (ref 3.5–5.0)
Anion gap: 9 (ref 5–15)
Anion gap: 11 (ref 5–15)
BUN: 92 mg/dL — ABNORMAL HIGH (ref 6–20)
BUN: 59 mg/dL — ABNORMAL HIGH (ref 6–20)
CO2: 20 mmol/L — ABNORMAL LOW (ref 22–32)
CO2: 24 mmol/L (ref 22–32)
Calcium: 7.8 mg/dL — ABNORMAL LOW (ref 8.9–10.3)
Calcium: 7.6 mg/dL — ABNORMAL LOW (ref 8.9–10.3)
Chloride: 109 mmol/L (ref 98–111)
Chloride: 99 mmol/L (ref 98–111)
Creatinine, Ser: 4.32 mg/dL — ABNORMAL HIGH (ref 0.44–1.00)
Creatinine, Ser: 3.25 mg/dL — ABNORMAL HIGH (ref 0.44–1.00)
GFR, Estimated: 12 mL/min — ABNORMAL LOW (ref >60)
GFR, Estimated: 16 mL/min — ABNORMAL LOW (ref >60)
Glucose, Bld: 118 mg/dL — ABNORMAL HIGH (ref 70–99)
Glucose, Bld: 126 mg/dL — ABNORMAL HIGH (ref 70–99)
Phosphorus: 7.8 mg/dL — ABNORMAL HIGH (ref 2.5–4.6)
Phosphorus: 5.6 mg/dL — ABNORMAL HIGH (ref 2.5–4.6)
Potassium: 5.2 mmol/L — ABNORMAL HIGH (ref 3.5–5.1)
Potassium: 4.1 mmol/L (ref 3.5–5.1)
Sodium: 138 mmol/L (ref 135–145)
Sodium: 134 mmol/L — ABNORMAL LOW (ref 135–145)

## 2022-05-13 LAB — FERRITIN: Ferritin: 453 ng/mL — ABNORMAL HIGH (ref 11–307)

## 2022-05-13 LAB — PREPARE RBC (CROSSMATCH)

## 2022-05-13 LAB — MAGNESIUM: Magnesium: 2.6 mg/dL — ABNORMAL HIGH (ref 1.7–2.4)

## 2022-05-13 LAB — TRIGLYCERIDES: Triglycerides: 94 mg/dL (ref <150)

## 2022-05-13 MED ORDER — HYDROMORPHONE HCL 1 MG/ML PO LIQD
0.5000 mg | ORAL | Status: DC
  Administered 2022-05-22 – 2022-05-23 (×4): 0.5 mg
  Filled 2022-05-13 (×6): qty 1

## 2022-05-13 MED ORDER — HYDROMORPHONE HCL 1 MG/ML PO LIQD
2.0000 mg | ORAL | Status: DC
  Administered 2022-05-13 – 2022-05-16 (×17): 2 mg
  Filled 2022-05-13 (×18): qty 2

## 2022-05-13 MED ORDER — CLONAZEPAM 0.1 MG/ML ORAL SUSPENSION: 0.5000 mg | Freq: Two times a day (BID) | ORAL | Status: DC

## 2022-05-13 MED ORDER — GERHARDT'S BUTT CREAM
TOPICAL_CREAM | CUTANEOUS | Status: DC | PRN
  Filled 2022-05-13 (×4): qty 1

## 2022-05-13 MED ORDER — LIP MEDEX EX OINT
TOPICAL_OINTMENT | CUTANEOUS | Status: DC | PRN
  Administered 2022-05-17 – 2022-05-24 (×3): 75 via TOPICAL
  Filled 2022-05-13 (×2): qty 7

## 2022-05-13 MED ORDER — HYDROMORPHONE HCL 1 MG/ML PO LIQD
1.5000 mg | ORAL | Status: AC
  Administered 2022-05-16 – 2022-05-19 (×18): 1.5 mg
  Filled 2022-05-13 (×18): qty 2

## 2022-05-13 MED ORDER — PANTOPRAZOLE 2 MG/ML SUSPENSION
40.0000 mg | Freq: Every day | ORAL | Status: DC
  Administered 2022-05-13 – 2022-05-27 (×15): 40 mg
  Filled 2022-05-13 (×16): qty 20

## 2022-05-13 MED ORDER — POLYETHYLENE GLYCOL 3350 17 G PO PACK: 17.0000 g | PACK | Freq: Every day | ORAL | Status: DC | PRN

## 2022-05-13 MED ORDER — SODIUM CHLORIDE 0.9 % IV SOLN
500.0000 mg | INTRAVENOUS | Status: AC
  Administered 2022-05-13 – 2022-05-17 (×4): 500 mg via INTRAVENOUS
  Filled 2022-05-13 (×6): qty 10

## 2022-05-13 MED ORDER — ALBUMIN HUMAN 25 % IV SOLN
INTRAVENOUS | Status: AC
  Administered 2022-05-13: 25 g via INTRAVENOUS
  Filled 2022-05-13: qty 50

## 2022-05-13 MED ORDER — HYDROMORPHONE HCL 1 MG/ML PO LIQD
1.0000 mg | ORAL | Status: AC
  Administered 2022-05-19 – 2022-05-22 (×17): 1 mg
  Filled 2022-05-13 (×17): qty 1

## 2022-05-13 MED ORDER — HEPARIN SODIUM (PORCINE) 1000 UNIT/ML DIALYSIS
1000.0000 [IU] | INTRAMUSCULAR | Status: DC | PRN
  Administered 2022-05-17 – 2022-05-22 (×2): 1000 [IU]
  Filled 2022-05-13: qty 1

## 2022-05-13 MED ORDER — ALTEPLASE 2 MG IJ SOLR: 2.0000 mg | Freq: Once | INTRAMUSCULAR | Status: DC | PRN

## 2022-05-13 MED ORDER — QUETIAPINE FUMARATE 25 MG PO TABS
50.0000 mg | ORAL_TABLET | Freq: Two times a day (BID) | ORAL | Status: DC
  Administered 2022-05-13 – 2022-05-21 (×18): 50 mg
  Filled 2022-05-13 (×2): qty 2
  Filled 2022-05-13 (×2): qty 1
  Filled 2022-05-13: qty 2
  Filled 2022-05-13 (×4): qty 1
  Filled 2022-05-13 (×2): qty 2
  Filled 2022-05-13 (×4): qty 1
  Filled 2022-05-13: qty 2
  Filled 2022-05-13 (×2): qty 1

## 2022-05-13 MED ORDER — ALBUMIN HUMAN 25 % IV SOLN: 25.0000 g | Freq: Once | INTRAVENOUS | Status: AC

## 2022-05-13 MED ORDER — SODIUM CHLORIDE 0.9% IV SOLUTION: Freq: Once | INTRAVENOUS | Status: AC

## 2022-05-13 MED ORDER — HEPARIN SODIUM (PORCINE) 1000 UNIT/ML IJ SOLN
INTRAMUSCULAR | Status: AC
  Administered 2022-05-13: 2200 [IU]
  Filled 2022-05-13: qty 4

## 2022-05-13 MED ORDER — ALBUMIN HUMAN 25 % IV SOLN
INTRAVENOUS | Status: AC
  Filled 2022-05-13: qty 50

## 2022-05-13 MED ORDER — CLONAZEPAM 0.5 MG PO TABS
0.5000 mg | ORAL_TABLET | Freq: Two times a day (BID) | ORAL | Status: DC
  Administered 2022-05-13 – 2022-05-17 (×9): 0.5 mg
  Filled 2022-05-13 (×9): qty 1

## 2022-05-13 MED FILL — Nutritional Supplement Liquid: ORAL | Qty: 1000 | Status: AC
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
## 2022-05-13 NOTE — Progress Notes (Signed)
NAME:  Patricia [name], MRN:  [id number], DOB:  [birth year], LOS: 98 ADMISSION DATE:  04/23/2022, CONSULTATION DATE:  04/29/2022 REFERRING MD:  Felicie Morn, MD, CHIEF COMPLAINT:   Small bowel obstruction, aspiration event, sepsis, respiratory failure    History of Present Illness:  Patricia [name] with above hx and nuc study in 2015 neg for ischemia done for chest pain and echo with EF 60-65% presented to hospital for excisional hernia repair (robotic hernia repair 04/23/22) with mesh, ileus post op on TNA. Developed vomiting and hypotension after NG came out on 04/29/22 and PNA on CXR, was tachycardic and BP improved with IV fluids. DX with sepsis and ABX started. At one point prolonged Qtc. Pt admitted to ICU on 10/2.  Was placed on levophed. Early AM on 10/3 was intubated for worsening metabolic/lactic acidosis. She had AKI as well. Crt was 6.01, her normal 0.9. Today Cr is 4 on CRRT.  Pertinent  Medical History  DM2 Depression OSA Migraines  Asthma Obesity Schizophrenia HTN HLD  Significant Hospital Events: Including procedures, antibiotic start and stop dates in addition to other pertinent events   9/26 Robotic hernia repair complicated by abd adhesions.  10/2 CT + for developing SBO and bilat lower lobe opacities most likely aspiration PNA. Progressive decompensation requiring transfer to ICU 10/2 PCCM consulted for respiratory failure & shock 10\3 Intubated for resp failure. CVL and arterial line placed. 04/30/22: EF 30-35%, RV moderately reduced, mild to moderate MR, IVC collapsed throughout respiratory cycle 10\4 EKG changes/trop increased--> and taken to cath lab - No obstructive CAD, but in decompensated heart failure, Bedside echo with EF 15%, new WMA, moderate RV dysfunction, trivial pericardial effusion.underwent emergent L/RHC. Normal coronaries, RA mean 13, PA mean 29, LVEDP 16, PCWP 19, CO/CI 3.96/1.87   returned to CICU post-cath. Right heart cath placed right fem.  Right IJ HD cath placed.  10/5 advanced HF team following, shock supported on norepi/vasopressin and Dobutamine. On CRRT.  10/6 weaning Norepi. Starting to wean fent. Needing restraints.  10/8: Reintubated due to neuromuscular weakness.  Bedside echo demonstrating improved RV function, LV function remained depressed 10/9 CT abdomen pelvis.suggesting wall thickening throughout the proximal and mid small bowel consistent with possible infectious or inflammatory enteritis there is also bibasilar airspace disease which could reflect pneumonia.  Antibiotic coverage widened.  Zosyn changed to meropenem.  Tolerating trickle feeds still on TPN 10/10 stopped stress dose steroids.  Resumed subcutaneous heparin. 10/13 albumin for coox decrease. Weaning vent . Failed SBT. At goal TF. 10/14 extubated to bipap failed due to weakness, trached 10/15 trial off CRRT, lasix  Interim History / Subjective:     Objective   Blood pressure 100/60, pulse 75, temperature 99.4 F (37.4 C), temperature source Oral, resp. rate 16, height '5\' 4"'$  (1.626 m), weight 108.6 kg, last menstrual period 07/07/2014, SpO2 97 %. CVP:  [8 mmHg-15 mmHg] 10 mmHg  Vent Mode: PSV;CPAP FiO2 (%):  [40 %] 40 % Set Rate:  [20 bmp] 20 bmp Vt Set:  [460 mL] 460 mL PEEP:  [5 cmH20] 5 cmH20 Pressure Support:  [8 cmH20] 8 cmH20 Plateau Pressure:  [14 cmH20-15 cmH20] 14 cmH20   Intake/Output Summary (Last 24 hours) at 05/13/2022 0953 Last data filed at 05/13/2022 0700 Gross per 24 hour  Intake 3575.56 ml  Output 600 ml  Net 2975.56 ml    Filed Weights   05/09/22 0202 05/10/22 0429 05/11/22 0500  Weight: 109.7 kg 108.7 kg 108.6 kg    Examination:  General: chronically ill appearing woman lying in bed in NAD HEENT: sore on upper lip- not currently bleeding. [place]/AT  Neck: trach in place with small amount of bleeding inferiorly Pulmonary: Breathing comfortably on PS 8/CPAP 5. Minimal bloody secretions.  Cardiac: S1S2, RRR Abdomen:  obese, soft, NT Extremities: pedal edema, worse hand edema today Neuro: RASS 0, moving all extremities    Ancillary tests personally reviewed:  Creatinine back up to 4.32 off furosemide HB: 6.8 Albumin: <1.5 Coox 81.5 No growth from trach aspirate Assessment & Plan:   Acute metabolic encephalopathy superimposed on hx bipolar disorder, schizophrenia   ICU delirium- improved -Start enteral sedation and analgesic taper with goal to come off sedative infusions completely  Acute biventricular HFrEF (Etiology sepsis, stress induced CM. Has clean coronaries) Cardiogenic shock resolved, EF improved.  -Stop CVP and ScvO2 monitoring.   Acute hypoxic respiratory failure Aspiration PNA Pulm edema   RLL VAP  -meropenem x 10 days -LTVV -con't weaning efforts - start progressive trach collar trials.  -Routine trach care; can remove sutures on 10/21. SLP consulted.  AKI with oliguria; no baseline CKD - failed attempt to come off HD - Permcath this week.   S/p hernia repair S/p LOA Ileus  inflammatory enteritis  -con't TF at goal -meropenem x 10 days -appreciate surgery's management  Anemia, acute on chronic  -transfuse 1 unit this morning for Hb <7  -monitor for signs of bleeding - none so far.   DM2 with hyperglycemia  -SSI PRN -drop TF coverage to 4 units q4h since TPN stopping -goal BG 140-180  Severe protein calorie malnutrition  -con't TF -stopping TPN today  Pressure injury- lip -having trach in place helps relieve pressure  -wound care  Deconditioning -PT, OT, SLP  Best Practice (right click and "Reselect all SmartList Selections" daily)   Diet/type: tubefeeds DVT prophylaxis: prophylactic heparin  GI prophylaxis: PPI Lines: Central line and Dialysis Catheter Foley:  Yes, and it is still needed Code Status:  full code  CRITICAL CARE Performed by: Kipp Brood   Total critical care time: 40 minutes  Critical care time was exclusive of separately  billable procedures and treating other patients.  Critical care was necessary to treat or prevent imminent or life-threatening deterioration.  Critical care was time spent personally by me on the following activities: development of treatment plan with patient and/or surrogate as well as nursing, discussions with consultants, evaluation of patient's response to treatment, examination of patient, obtaining history from patient or surrogate, ordering and performing treatments and interventions, ordering and review of laboratory studies, ordering and review of radiographic studies, pulse oximetry, re-evaluation of patient's condition and participation in multidisciplinary rounds.  Kipp Brood, MD [facility] ICU Physician [place]  Pager: [phone] Mobile: [phone] After hours: [phone].

## 2022-05-13 NOTE — Progress Notes (Signed)
12 Days Post-Op   Subjective/Chief Complaint: Doing well with Trach on pressure support.  Tolerating tube feeds.  Wants something to drink.  On dialysis.  Objective: Vital signs in last 24 hours: Temp:  [99.3 F (37.4 C)-100.9 F (38.3 C)] 99.4 F (37.4 C) (10/16 0700) Pulse Rate:  [76-106] 76 (10/16 0900) Resp:  [16-29] 16 (10/16 0900) BP: (104-140)/(59-64) 104/60 (10/16 0900) SpO2:  [100 %] 100 % (10/16 0900) Arterial Line BP: (104-134)/(53-72) 108/64 (10/16 0700) FiO2 (%):  [40 %] 40 % (10/16 0743) Last BM Date : 05/11/22  Intake/Output from previous day: 10/15 0701 - 10/16 0700 In: 4022.6 [I.V.:1708.9; Blood:859.6; NG/GT:1200; IV Piggyback:224.1] Out: 600 [Stool:600] Intake/Output this shift: No intake/output data recorded.  Exam: Sitting up in bed Trach, on vent Follows commands Abdomen soft, obese, mild TTP. Incisions c/d/i  Lab Results:  Recent Labs    05/12/22 0454 05/12/22 1455 05/13/22 0314  WBC 15.4*  --  12.9*  HGB 6.2* 7.1* 6.8*  HCT 19.2* 21.6* 21.6*  PLT 177  --  189    BMET Recent Labs    05/12/22 1544 05/13/22 0314  NA 136 138  K 4.9 5.2*  CL 107 109  CO2 20* 20*  GLUCOSE 146* 118*  BUN 76* 92*  CREATININE 3.39* 4.32*  CALCIUM 7.7* 7.8*    PT/INR No results for input(s): "LABPROT", "INR" in the last 72 hours. ABG Recent Labs    05/11/22 1514  PHART 7.434  HCO3 23.8     Studies/Results: DG Chest Port 1 View  Result Date: 05/11/2022 CLINICAL DATA:  Status post tracheostomy EXAM: PORTABLE CHEST 1 VIEW COMPARISON:  Previous studies including the examination of 05/05/2022 FINDINGS: There is interval placement of tracheostomy. Tip of tracheostomy is 3.9 cm above the carina. Enteric tube is noted traversing the esophagus. Tip of right IJ central venous catheter is seen in superior vena cava. There are linear densities in the lower lung fields, more so on the left side. There are no signs of pulmonary edema or focal pulmonary  consolidation. There is minimal blunting of right lateral CP angle. There is no pneumothorax. IMPRESSION: There are linear densities in the lower lung fields, more so on the left side suggesting subsegmental atelectasis. There is possible minimal right pleural effusion. Electronically Signed   By: Elmer Picker M.D.   On: 05/11/2022 14:09    Anti-infectives: Anti-infectives (From admission, onward)    Start     Dose/Rate Route Frequency Ordered Stop   05/13/22 2134  meropenem (MERREM) 500 mg in sodium chloride 0.9 % 100 mL IVPB        500 mg 200 mL/hr over 30 Minutes Intravenous Every 24 hours 05/13/22 0815 05/17/22 2144   05/13/22 1000  meropenem (MERREM) 500 mg in sodium chloride 0.9 % 100 mL IVPB  Status:  Discontinued        500 mg 200 mL/hr over 30 Minutes Intravenous Every 12 hours 05/12/22 0940 05/12/22 0946   05/12/22 2200  meropenem (MERREM) 500 mg in sodium chloride 0.9 % 100 mL IVPB  Status:  Discontinued        500 mg 200 mL/hr over 30 Minutes Intravenous Every 12 hours 05/12/22 0946 05/13/22 0815   05/12/22 2000  meropenem (MERREM) 1 g in sodium chloride 0.9 % 100 mL IVPB  Status:  Discontinued        1 g 200 mL/hr over 30 Minutes Intravenous  Once 05/12/22 0940 05/12/22 0946   05/06/22 1530  meropenem (MERREM) 1  g in sodium chloride 0.9 % 100 mL IVPB  Status:  Discontinued        1 g 200 mL/hr over 30 Minutes Intravenous Every 8 hours 05/06/22 1438 05/12/22 0940   05/04/22 1000  vancomycin (VANCOCIN) IVPB 1000 mg/200 mL premix  Status:  Discontinued        1,000 mg 200 mL/hr over 60 Minutes Intravenous Every 24 hours 05/03/22 0802 05/08/22 0913   05/03/22 0845  vancomycin (VANCOREADY) IVPB 2000 mg/400 mL        2,000 mg 200 mL/hr over 120 Minutes Intravenous  Once 05/03/22 0751 05/03/22 1114   05/01/22 1800  piperacillin-tazobactam (ZOSYN) IVPB 3.375 g  Status:  Discontinued        3.375 g 12.5 mL/hr over 240 Minutes Intravenous Every 6 hours 05/01/22 1426 05/01/22  1427   05/01/22 1515  piperacillin-tazobactam (ZOSYN) IVPB 3.375 g  Status:  Discontinued        3.375 g 100 mL/hr over 30 Minutes Intravenous Every 6 hours 05/01/22 1428 05/06/22 1438   04/30/22 1400  piperacillin-tazobactam (ZOSYN) IVPB 2.25 g  Status:  Discontinued        2.25 g 100 mL/hr over 30 Minutes Intravenous Every 8 hours 04/30/22 1325 05/01/22 1426   04/29/22 1530  piperacillin-tazobactam (ZOSYN) IVPB 3.375 g  Status:  Discontinued        3.375 g 12.5 mL/hr over 240 Minutes Intravenous Every 8 hours 04/29/22 1433 04/30/22 1325   04/29/22 1145  metroNIDAZOLE (FLAGYL) IVPB 500 mg  Status:  Discontinued        500 mg 100 mL/hr over 60 Minutes Intravenous Every 12 hours 04/29/22 1047 04/29/22 1433   04/29/22 1145  vancomycin (VANCOCIN) IVPB 1000 mg/200 mL premix  Status:  Discontinued        1,000 mg 200 mL/hr over 60 Minutes Intravenous  Once 04/29/22 1047 04/29/22 1055   04/29/22 1145  vancomycin (VANCOREADY) IVPB 1500 mg/300 mL  Status:  Discontinued        1,500 mg 150 mL/hr over 120 Minutes Intravenous Every 24 hours 04/29/22 1055 04/29/22 1357   04/29/22 1045  ceFEPIme (MAXIPIME) 2 g in sodium chloride 0.9 % 100 mL IVPB  Status:  Discontinued        2 g 200 mL/hr over 30 Minutes Intravenous Every 8 hours 04/29/22 0954 04/29/22 1433   04/23/22 0730  ceFAZolin (ANCEF) IVPB 2g/100 mL premix        2 g 200 mL/hr over 30 Minutes Intravenous On call to O.R. 04/23/22 0726 04/23/22 5638       Assessment/Plan: Ms. Obeirne is s/p robotic incisional hernia repair on 04/23/22 -Postop ileus complicated by aspiration: Ileus resolved, having bowel function.  Tolerating tube feeds at goal Abdomen fairly benign on exam. Loose stools - hold laxatives   Cardiogenic/septic shock - Off pressors   AKI -HD. Did not respond to lasix challenge   Resp failure -Trach.  May be able to try speaking valve soon   Felicie Morn, [facility] 05/13/2022, 9:05  AM Please see Amion for pager number during day hours 7:00am-4:30pm

## 2022-05-13 NOTE — Progress Notes (Addendum)
1930 patient alert able to nod to questions and follows all commands 1956 decreasing Precedex 2141 Precedex off  2200 patient resp and HR increasing repositioned and suctioned, sister and mother called and update was given after password verified. 2258 dex restarted patient resp 45s HR 120s not tolerating being off dex PRN pain medication also given 05/14/2022 6am Called E-link for HBG 7 no new orders

## 2022-05-13 NOTE — Progress Notes (Signed)
Patient ID: Patricia [name], female   DOB: [birth year], 51 y.o.   MRN: [id number] S: bumex given yesterday, no urine output charted. Hgb 6.8 this am, 1u prbc ordered overnight. When asked how she is doing, patient lifted her hand up to indicate 'so-so'. O:BP 109/61   Pulse 79   Temp 99.4 F (37.4 C) (Oral)   Resp 18   Ht '5\' 4"'$  (1.626 m)   Wt 108.6 kg   LMP 07/07/2014 (Approximate)   SpO2 100%   BMI 41.10 kg/m   Intake/Output Summary (Last 24 hours) at 05/13/2022 0804 Last data filed at 05/13/2022 0700 Gross per 24 hour  Intake 3719.69 ml  Output 600 ml  Net 3119.69 ml   Intake/Output: I/O last 3 completed shifts: In: 5845.8 [I.V.:2840.4; Blood:859.6; Other:30; NG/GT:1750; IV Piggyback:365.8] Out: 1611 [Stool:600]  Intake/Output this shift:  No intake/output data recorded. Weight change:  Gen:on vent via trach, awake CVS: RRR Resp:Ventilated BS bilaterally Abd: +BS, soft, NT Ext:2+ edema of hands bilaterally legs wrapped Dialysis access:   Recent Labs  Lab 05/09/22 0836 05/09/22 1549 05/10/22 0403 05/10/22 1654 05/11/22 0445 05/11/22 1514 05/11/22 1516 05/12/22 0454 05/12/22 1544 05/13/22 0314  NA  --    < > 138 139 138 138 138 137 136 138  K  --    < > 4.0 4.3 4.4 4.7 4.6 4.8 4.9 5.2*  CL  --    < > 107 107 105  --  106 107 107 109  CO2  --    < > '25 22 24  '$ --  24 23 20* 20*  GLUCOSE  --    < > 144* 148* 142*  --  132* 154* 146* 118*  BUN  --    < > 51* 51* 50*  --  49* 53* 76* 92*  CREATININE  --    < > 2.22* 2.15* 2.12*  --  2.18* 2.45* 3.39* 4.32*  ALBUMIN <1.5*   < > <1.5* 1.6* 1.6*  --  1.6* <1.5* <1.5* <1.5*  CALCIUM  --    < > 7.2* 7.7* 7.8*  --  7.7* 8.0* 7.7* 7.8*  PHOS  --    < > 4.2 4.3 4.5  --  4.9* 5.4* 6.7* 7.8*  AST 385*  --   --   --   --   --   --   --   --   --   ALT 324*  --   --   --   --   --   --   --   --   --    < > = values in this interval not displayed.   Liver Function Tests: Recent Labs  Lab 05/09/22 0836 05/09/22 1549  05/12/22 0454 05/12/22 1544 05/13/22 0314  AST 385*  --   --   --   --   ALT 324*  --   --   --   --   ALKPHOS 216*  --   --   --   --   BILITOT 1.6*  --   --   --   --   PROT 5.2*  --   --   --   --   ALBUMIN <1.5*   < > <1.5* <1.5* <1.5*   < > = values in this interval not displayed.   No results for input(s): "LIPASE", "AMYLASE" in the last 168 hours. No results for input(s): "AMMONIA" in the last 168 hours. CBC: Recent Labs  Lab 05/07/22 0428 05/07/22 2146 05/09/22 0422 05/10/22 0719 05/10/22 1316 05/11/22 0445 05/11/22 1514 05/12/22 0454 05/12/22 1455 05/13/22 0314  WBC 47.3*   < > 27.9* 23.5*  --  21.0*  --  15.4*  --  12.9*  NEUTROABS 35.8*  --   --   --   --   --   --   --   --   --   HGB 8.6*   < > 7.8* 7.4*   < > 7.0*   < > 6.2* 7.1* 6.8*  HCT 25.5*   < > 23.8* 22.6*   < > 21.6*   < > 19.2* 21.6* 21.6*  MCV 81.7   < > 82.9 82.8  --  83.7  --  84.6  --  85.4  PLT 102*   < > 153 182  --  173  --  177  --  189   < > = values in this interval not displayed.   Cardiac Enzymes: No results for input(s): "CKTOTAL", "CKMB", "CKMBINDEX", "TROPONINI" in the last 168 hours. CBG: Recent Labs  Lab 05/12/22 1136 05/12/22 1539 05/12/22 1956 05/12/22 2341 05/13/22 0315  GLUCAP 162* 132* 125* 114* 109*    Iron Studies: No results for input(s): "IRON", "TIBC", "TRANSFERRIN", "FERRITIN" in the last 72 hours. Studies/Results: DG Chest Port 1 View  Result Date: 05/11/2022 CLINICAL DATA:  Status post tracheostomy EXAM: PORTABLE CHEST 1 VIEW COMPARISON:  Previous studies including the examination of 05/05/2022 FINDINGS: There is interval placement of tracheostomy. Tip of tracheostomy is 3.9 cm above the carina. Enteric tube is noted traversing the esophagus. Tip of right IJ central venous catheter is seen in superior vena cava. There are linear densities in the lower lung fields, more so on the left side. There are no signs of pulmonary edema or focal pulmonary consolidation.  There is minimal blunting of right lateral CP angle. There is no pneumothorax. IMPRESSION: There are linear densities in the lower lung fields, more so on the left side suggesting subsegmental atelectasis. There is possible minimal right pleural effusion. Electronically Signed   By: Elmer Picker M.D.   On: 05/11/2022 14:09    Chlorhexidine Gluconate Cloth  6 each Topical Daily   Chlorhexidine Gluconate Cloth  6 each Topical Q0600   docusate  100 mg Per Tube BID   feeding supplement (PROSource TF20)  60 mL Per Tube BID   heparin injection (subcutaneous)  5,000 Units Subcutaneous Q8H   heparin sodium (porcine)       insulin aspart  0-20 Units Subcutaneous Q4H   insulin aspart  4 Units Subcutaneous Q4H   multivitamin  1 tablet Per Tube QHS   mouth rinse  15 mL Mouth Rinse Q2H   pantoprazole (PROTONIX) IV  40 mg Intravenous Q24H   polyethylene glycol  17 g Per Tube Daily   sodium chloride flush  10-40 mL Intracatheter Q12H   sodium chloride flush  3 mL Intravenous Q12H    BMET    Component Value Date/Time   NA 138 05/13/2022 0314   NA 139 07/02/2014 1923   K 5.2 (H) 05/13/2022 0314   K 3.9 07/02/2014 1923   CL 109 05/13/2022 0314   CL 105 07/02/2014 1923   CO2 20 (L) 05/13/2022 0314   CO2 26 07/02/2014 1923   GLUCOSE 118 (H) 05/13/2022 0314   GLUCOSE 95 07/02/2014 1923   BUN 92 (H) 05/13/2022 0314   BUN 7 07/02/2014 1923   CREATININE 4.32 (H) 05/13/2022  0314   CREATININE 0.98 07/02/2014 1923   CALCIUM 7.8 (L) 05/13/2022 0314   CALCIUM 8.5 07/02/2014 1923   GFRNONAA 12 (L) 05/13/2022 0314   GFRNONAA >60 07/02/2014 1923   GFRAA >60 04/22/2018 2215   GFRAA >60 07/02/2014 1923   CBC    Component Value Date/Time   WBC 12.9 (H) 05/13/2022 0314   RBC 2.53 (L) 05/13/2022 0314   HGB 6.8 (LL) 05/13/2022 0314   HGB 9.4 (L) 07/02/2014 1923   HCT 21.6 (L) 05/13/2022 0314   HCT 30.3 (L) 07/02/2014 1923   PLT 189 05/13/2022 0314   PLT 449 (H) 07/02/2014 1923   MCV 85.4  05/13/2022 0314   MCV 71 (L) 07/02/2014 1923   MCH 26.9 05/13/2022 0314   MCHC 31.5 05/13/2022 0314   RDW 15.9 (H) 05/13/2022 0314   RDW 18.8 (H) 07/02/2014 1923   LYMPHSABS 2.0 05/07/2022 0428   MONOABS 2.4 (H) 05/07/2022 0428   EOSABS 0.0 05/07/2022 0428   BASOSABS 0.1 05/07/2022 0428   Assessment/Plan:   AKI - likely ATN in the setting of cardiogenic and septic shock. BL Cr ~0.8-1. Started on CRRT 05/01/22 after RIJ temp HD catheter placed.  CRRT stopped 05/11/22.  Off pressors and would like to attempt IHD today, will tentatively keep her on MWF schedule. No evidence of renal recovery unfortunately based on labs. At this junction, will need to consider getting a tunneled dialysis catheter this week, will first see how she does with IHD today Avoid nephrotoxic medications including NSAIDs and iodinated intravenous contrast exposure unless the latter is absolutely indicated.   Preferred narcotic agents for pain control are hydromorphone, fentanyl, and methadone. Morphine should not be used.  Avoid Baclofen and avoid oral sodium phosphate and magnesium citrate based laxatives / bowel preps.  Continue strict Input and Output monitoring. Will monitor the patient closely with you and intervene or adjust therapy as indicated by changes in clinical status/labs  Vascular access - will need another HD catheter as present one has been in since 05/01/22.  Will ask PCCM to consider new location for HD catheter early this week vs TDC placement Cardiogenic/septic shock - weaned off pressors  Heart failure team following. Likely due to aspiration pneumonia. Acute hypoxic respiratory failure - was re-intubated 05/05/22.  Extubated again on 10/14 but failed BiPAP, s/p trach and back on Vent 05/11/22 per PCCM. Infectious enteritis - seen on CT scan.  WBC continues to climb.  Plan per PCCM. Anemia of critical illness - transfuse for Hgb <7. Requiring PRBCs. Per primary Acute MI - normal coronaries by cath  10/4 AGMA - improved with CRRT. Thrombocytopenia - improving.  S/p robotic incisional hernia repair with mesh and LOA - Surgery following.   Gean Quint, MD [facility]

## 2022-05-13 NOTE — Procedures (Signed)
HD Note:  Some information was entered later than the data was gathered due to patient care needs. The stated time with the data is accurate.Received patient in bed to unit.   Alert,on vent.  Unable to determine orientation.    Informed consent signed and in chart.   Patient tolerated well.   Alert, without acute distress.  Hand-off given to patient's nurse.   Access used: IJ temporary HD tri lumen catheter Access issues: lines had to be reversed to maintain appropriate pressures with the dialysis machine. No other issues.  Total UF removed: 2300, backed down from goal of 3000 related to SBP less than 90.  Albumin was given to support BP.      [facility]

## 2022-05-13 NOTE — Progress Notes (Signed)
Advanced Heart Failure Rounding Note  PCP-Cardiologist: None   Subjective:    - CRRT stopped 10/14  -s/p tracheostomy 10/14 - transfused 1u RBC yesterday, Hgb 6.2>>7.1  Awake on vent through trach, FiO2 40%.   Hgb back down again today, 6.8.   Co-ox 82%. CVP 10   SCr trending up, 2.45>>3.39>>4.32. Failed Bumex. Anuric. K 5.2. Nephrology following, planning iHD today.  MAPs 80s. No current pressor requirements.   WBC 21>>15>>13. Febrile overnight, mTemp 100.9. On meropenum.   Objective:   Weight Range: 108.6 kg Body mass index is 41.1 kg/m.   Vital Signs:   Temp:  [99.3 F (37.4 C)-100.9 F (38.3 C)] 99.4 F (37.4 C) (10/16 0700) Pulse Rate:  [77-106] 79 (10/16 0743) Resp:  [15-29] 18 (10/16 0743) BP: (109-125)/(60-64) 109/61 (10/16 0743) SpO2:  [100 %] 100 % (10/16 0743) Arterial Line BP: (104-134)/(53-72) 108/64 (10/16 0700) FiO2 (%):  [40 %] 40 % (10/16 0743) Last BM Date : 05/11/22  Weight change: Filed Weights   05/09/22 0202 05/10/22 0429 05/11/22 0500  Weight: 109.7 kg 108.7 kg 108.6 kg    Intake/Output:   Intake/Output Summary (Last 24 hours) at 05/13/2022 0806 Last data filed at 05/13/2022 0700 Gross per 24 hour  Intake 3719.69 ml  Output 600 ml  Net 3119.69 ml      Physical Exam   CVP 10  General: wake, no distress  HEENT: normal; trach in place Neck: supple. Thick neck, JVD not well visualized. Carotids 2+ bilat; no bruits. No lymphadenopathy or thyromegaly appreciated. Cor: PMI nondisplaced. Regular rate & rhythm. No rubs, gallops or murmurs. Lungs: on vent through trach, clear  Abdomen: obese, soft, nontender, nondistended. No hepatosplenomegaly. No bruits or masses. Good bowel sounds. Extremities: no cyanosis, clubbing, rash, 1+ b/l LE edema Neuro: Awake on vent. Good grip strength moving bilateral lower and upper extremities  Telemetry   NSR 90s   Labs    CBC Recent Labs    05/12/22 0454 05/12/22 1455 05/13/22 0314   WBC 15.4*  --  12.9*  HGB 6.2* 7.1* 6.8*  HCT 19.2* 21.6* 21.6*  MCV 84.6  --  85.4  PLT 177  --  267   Basic Metabolic Panel Recent Labs    05/12/22 0454 05/12/22 1544 05/13/22 0314  NA 137 136 138  K 4.8 4.9 5.2*  CL 107 107 109  CO2 23 20* 20*  GLUCOSE 154* 146* 118*  BUN 53* 76* 92*  CREATININE 2.45* 3.39* 4.32*  CALCIUM 8.0* 7.7* 7.8*  MG 2.3  --  2.6*  PHOS 5.4* 6.7* 7.8*   Liver Function Tests  Imaging   Pertinent imaging reviewed;  Echo 05/02/22: LVEF 20%, RV function moderately reduced.   Medications:     Scheduled Medications:  Chlorhexidine Gluconate Cloth  6 each Topical Daily   Chlorhexidine Gluconate Cloth  6 each Topical Q0600   docusate  100 mg Per Tube BID   feeding supplement (PROSource TF20)  60 mL Per Tube BID   heparin injection (subcutaneous)  5,000 Units Subcutaneous Q8H   heparin sodium (porcine)       insulin aspart  0-20 Units Subcutaneous Q4H   insulin aspart  4 Units Subcutaneous Q4H   multivitamin  1 tablet Per Tube QHS   mouth rinse  15 mL Mouth Rinse Q2H   pantoprazole (PROTONIX) IV  40 mg Intravenous Q24H   polyethylene glycol  17 g Per Tube Daily   sodium chloride flush  10-40 mL Intracatheter  Q12H   sodium chloride flush  3 mL Intravenous Q12H    Infusions:  sodium chloride     sodium chloride Stopped (05/04/22 2003)   sodium chloride 10 mL/hr at 05/13/22 0600   sodium chloride Stopped (05/09/22 1901)   sodium chloride     dexmedetomidine (PRECEDEX) IV infusion 1.2 mcg/kg/hr (05/13/22 0600)   fentaNYL infusion INTRAVENOUS Stopped (05/11/22 1002)   fentaNYL infusion INTRAVENOUS 100 mcg/hr (05/13/22 0600)   meropenem (MERREM) IV Stopped (05/12/22 2205)   norepinephrine (LEVOPHED) Adult infusion Stopped (05/11/22 1317)   Peptamen 1.5 1,000 mL (05/12/22 1814)    PRN Medications: sodium chloride, sodium chloride, acetaminophen, alum & mag hydroxide-simeth, fentaNYL, fentaNYL (SUBLIMAZE) injection, fentaNYL (SUBLIMAZE)  injection, heparin sodium (porcine), HYDROmorphone (DILAUDID) injection, midazolam, midazolam, ondansetron (ZOFRAN) IV, mouth rinse, sodium chloride flush, sodium chloride flush    Patient Profile  51 y.o. AAF with history of bipolar disorder/schizophrenia, DM II, obesity, prior abdominal surgeries. Underwent robotic incisional hernia repair on 09/26.    Course c/b post-op ilieus/obstruction followed by septic shock 2/2 aspiration PNA, AKI on CRRT and cardiogenic shock.  Assessment/Plan  Cardiogenic & Septic shock -Septic shock 2/2 aspiration PNA. Procal > 150 -Taken emergently to cath lab 10/4 d/t concern for ST elevation MI. HS troponin > 24K. Coronaries clean. CI 1.87, RA mean 13, PCWP 19, LVEDP 16.  - WBC trending down; continuing merrem. - Blood cultures 10/3 negative - On meropenum per CCM. - TTE w/ normal LV/RV function. Mixed venous have been stable, 82 % today   2. Acute systolic CHF -Nonischemic secondary to stress cardiomyopathy, normal LV and RV function on repeat echocardiogram.  3. Acute MI (resolved) -ST elevation in inferolateral leads on ECG  -HS troponin > 24K at peak -Cath with normal coronaries   4. Metabolic acidosis: -Improved; off CRRT -starting iHD today    5.  Oliguric renal failure: -No urine output for several days. Failed bumex - nephrology starting iHD today   6. Acute hypoxic respiratory failure: -2/2 aspiration PNA -s/p trach 10/14   7. Incisional hernia: -s/p robotic repair with mesh/lysis of adhesions this admit -GSU following   8. SBO -Post-op -Receiving TPN -Loose stools; stopping stool softners.   9. Thrombocytopenia , resolved - stable, HIT negative, SRA negative.   10. Anemia - OP bleeding - Hgb 6.8, 1u RBCs ordered - follow closely    Length of Stay: 19 Advanced Heart Failure Team Pager [phone] (M-F; [place])  Please contact [facility] for night-coverage after hours (4p -7a ) and weekends on  [email or web address]   Lyda Jester, PA-C  Advanced heart failure 8:06 AM

## 2022-05-13 NOTE — Progress Notes (Signed)
Pharmacy Antibiotic Note  Patricia [name] is a 51 y.o. female admitted on 04/23/2022 with sepsis.  Pharmacy has been consulted for meropenem dosing.  Pt presented for hernia repair, now with resolved cardiogenic and septic shock s/p inotropes and vasopressors. Pt remains on mechanical ventilation s/p trach on 10/14. Pt has acute renal failure requiring CRRT, however CRRT stopped today (10/15). WBC continues to decrease on meropenem after stopping steroids to 15.4 today and pt remains afebrile. Will monitor temp off CRRT. CT 10/9 showed RLL PNA and possibly infectious enteritis. Per discussion with CCM today, will continue meropenem through 10/19 per CCM. Will monitor for transition to iHD and in meantime adjust abx dosing to dosing without iHD.  Cr worsening, no UOP documented.  Plan: Adjust meropenem to 500 mg q24h for now  Height: '5\' 4"'$  (162.6 cm) Weight: 108.6 kg (239 lb 6.7 oz) IBW/kg (Calculated) : 54.7  Temp (24hrs), Avg:99.9 F (37.7 C), Min:99.3 F (37.4 C), Max:100.9 F (38.3 C)  Recent Labs  Lab 05/09/22 0422 05/09/22 1549 05/10/22 0719 05/10/22 1654 05/11/22 0445 05/11/22 1516 05/12/22 0454 05/12/22 1544 05/13/22 0314  WBC 27.9*  --  23.5*  --  21.0*  --  15.4*  --  12.9*  CREATININE 1.96*   < >  --    < > 2.12* 2.18* 2.45* 3.39* 4.32*   < > = values in this interval not displayed.     Estimated Creatinine Clearance: 18.3 mL/min (A) (by C-G formula based on SCr of 4.32 mg/dL (H)).    Allergies  Allergen Reactions   Iodinated Contrast Media Shortness Of Breath, Other (See Comments) and Cough    Throat itching    Peanut-Containing Drug Products Anaphylaxis    Peanut Oil Only.  Can eat peanuts with no problems   Oxycodone-Acetaminophen Itching    Antimicrobials this admission: Zosyn 10/4 >> 10/9  Vanc 10/6 >> 10/10 Meropenem 10/9 >>   Microbiology results: 10/2 BCx: NG 10/3 BCxL NG 10/2 MRSA PCR: neg 10/9 Sputum cx: NG 10/14 Resp cx: NGTD  Thank  you for allowing pharmacy to participate in this patient's care.  Arrie Senate, PharmD, BCPS, [facility] Clinical Pharmacist [phone] Please check AMION for all [place] numbers 05/13/2022

## 2022-05-13 NOTE — Progress Notes (Signed)
Treatment for HD ended at 1210.  Documentation was entered at 1614 without the actual time being set appropriately.

## 2022-05-13 NOTE — Progress Notes (Signed)
eLink Physician-Brief Progress Note Patient Name: Patricia [name] DOB: Jul 31, 1970 MRN: [id number]   Date of Service  05/13/2022  HPI/Events of Note  Notified of anemia with hgb down to 6.8.  Pt is s/p tracheostomy and has minimila bleeding being suctioned through trache.   eICU Interventions  Transfuse 1 unit pRBC.     Intervention Category Intermediate Interventions: Bleeding - evaluation and treatment with blood products  Elsie Lincoln 05/13/2022, 4:46 AM

## 2022-05-13 NOTE — Evaluation (Signed)
Occupational Therapy Evaluation Patient Details Name: Patricia [name] MRN: [id number] DOB: 12-31-1970 Today's Date: 05/13/2022   History of Present Illness Presented to hospital for excisional hernia repair (robotic hernia repair 04/23/22) with mesh, ileus post op on TNA. Developed vomiting and hypotension after NG came out on 04/29/22 and PNA on CXR, was tachycardic and BP improved with IV fluids. DX with sepsis and ABX started. Pt admitted to ICU on 10/2.  Was placed on levophed. Early AM on 10/3 was intubated for worsening metabolic/lactic acidosis. 10/4 cardiac cath with EF 15%; She had AKI as well. Started CRRT. Extubation 10/14 failed and underwent tracheostomy.   Clinical Impression   Patricia [name] was evaluated s/p the above admission list, she is typically indep at baseline. Upon evaluation pt was limited by general debility from being so critically ill, pt on trach collar with RR up to 35 and recovering to 20s with cues. Max/total A for rolling to adjust linens needed and pt place in chair position in bed. While supported upright pt tolerated BUE PROM and bimanual grooming tasks with max A. Otherwise pt is total A at bed level. OT to continue to follow. Recommend d/c to AIR in anticipation pt will progress in activity tolerance while in acute care.      Recommendations for follow up therapy are one component of a multi-disciplinary discharge planning process, led by the attending physician.  Recommendations may be updated based on patient status, additional functional criteria and insurance authorization.   Follow Up Recommendations  Acute inpatient rehab (3hours/day)    Assistance Recommended at Discharge Frequent or constant Supervision/Assistance  Patient can return home with the following A lot of help with walking and/or transfers;Two people to help with walking and/or transfers;Two people to help with bathing/dressing/bathroom;A lot of help with bathing/dressing/bathroom;Assist for  transportation;Help with stairs or ramp for entrance    Functional Status Assessment  Patient has had a recent decline in their functional status and demonstrates the ability to make significant improvements in function in a reasonable and predictable amount of time.  Equipment Recommendations  Other (comment) (defer)    Recommendations for Other Services Rehab consult     Precautions / Restrictions Precautions Precautions: Fall Precaution Comments: new trach, multiple ICU lines/monitors Restrictions Weight Bearing Restrictions: No      Mobility Bed Mobility Overal bed mobility: Needs Assistance Bed Mobility: Rolling Rolling: Max assist, Total assist         General bed mobility comments: minimally assisting to roll to adjust linens    Transfers Overall transfer level: Needs assistance                 General transfer comment: deferred      Balance Overall balance assessment: Needs assistance             ADL either performed or assessed with clinical judgement   ADL Overall ADL's : Needs assistance/impaired Eating/Feeding: NPO   Grooming: Maximal assistance;Bed level   Upper Body Bathing: Maximal assistance;Bed level   Lower Body Bathing: Total assistance   Upper Body Dressing : Maximal assistance;Bed level   Lower Body Dressing: Total assistance   Toilet Transfer: Total assistance   Toileting- Clothing Manipulation and Hygiene: Total assistance         General ADL Comments: limited by general debility, will need at least +2 to get EOB or OOB     Vision Baseline Vision/History: 0 No visual deficits Vision Assessment?: No apparent visual deficits     Perception Perception  Perception: Not tested   Praxis Praxis Praxis: Not tested    Pertinent Vitals/Pain Pain Assessment Pain Assessment: Faces Faces Pain Scale: Hurts little more Pain Location: neck with head movement Pain Descriptors / Indicators: Grimacing Pain  Intervention(s): Monitored during session     Hand Dominance Left   Extremity/Trunk Assessment Upper Extremity Assessment Upper Extremity Assessment: RUE deficits/detail;LUE deficits/detail RUE Deficits / Details: Limited PROM due to multiple lines and pain. poor grip strength, able to bring UE to midline for bimanual grooming task RUE Sensation: WNL RUE Coordination: decreased fine motor;decreased gross motor LUE Deficits / Details: increased edema throughout UE. full ROM. generalized weakness. able to bring to midline to complete bimanual grooming task LUE Sensation: WNL LUE Coordination: decreased fine motor;decreased gross motor   Lower Extremity Assessment Lower Extremity Assessment: Defer to PT evaluation   Cervical / Trunk Assessment Cervical / Trunk Assessment: Other exceptions Cervical / Trunk Exceptions: obese, ventral hernia repair 9/26   Communication Communication Communication: Tracheostomy   Cognition Arousal/Alertness: Awake/alert Behavior During Therapy: Flat affect Overall Cognitive Status: Difficult to assess                                 General Comments: shaking head and gesturing to answer questions accurately. no attempt at verbalizing despite encouragement.     General Comments  VSS on RA     Home Living Family/patient expects to be discharged to:: Private residence Living Arrangements: Other relatives Available Help at Discharge: Family Type of Home: Apartment Home Access: Stairs to enter Technical brewer of Steps: 2 Entrance Stairs-Rails: None Home Layout: One level     Bathroom Shower/Tub: Teacher, early years/pre: Standard     Home Equipment: None   Additional Comments: sister present and able to provide information      Prior Functioning/Environment Prior Level of Function : Independent/Modified Independent                        OT Problem List: Decreased strength;Decreased range of  motion;Decreased activity tolerance;Impaired balance (sitting and/or standing);Decreased safety awareness;Pain      OT Treatment/Interventions: Self-care/ADL training;Therapeutic exercise;DME and/or AE instruction;Therapeutic activities;Patient/family education;Balance training    OT Goals(Current goals can be found in the care plan section) Acute Rehab OT Goals Patient Stated Goal: to feel better OT Goal Formulation: With patient Time For Goal Achievement: 05/27/22 Potential to Achieve Goals: Good ADL Goals Pt Will Perform Grooming: with mod assist;sitting Pt Will Perform Upper Body Dressing: with mod assist;sitting Pt Will Perform Lower Body Dressing: with max assist;sit to/from stand Pt Will Transfer to Toilet: with max assist;stand pivot transfer;bedside commode Additional ADL Goal #1: Pt will complete bed mobility with mod A as a precursor to ADLs  OT Frequency: Min 2X/week       AM-PAC OT "6 Clicks" Daily Activity     Outcome Measure Help from another person eating meals?: Total Help from another person taking care of personal grooming?: A Lot Help from another person toileting, which includes using toliet, bedpan, or urinal?: Total Help from another person bathing (including washing, rinsing, drying)?: Total Help from another person to put on and taking off regular upper body clothing?: A Lot Help from another person to put on and taking off regular lower body clothing?: Total 6 Click Score: 8   End of Session Nurse Communication: Mobility status  Activity Tolerance: Patient tolerated treatment well Patient  left: in bed;with call bell/phone within reach  OT Visit Diagnosis: Unsteadiness on feet (R26.81);Other abnormalities of gait and mobility (R26.89);Muscle weakness (generalized) (M62.81);Pain                Time: 1535-1555 OT Time Calculation (min): 20 min Charges:  OT General Charges $OT Visit: 1 Visit OT Evaluation $OT Eval Moderate Complexity: 1 Mod   Teya Otterson  D Causey 05/13/2022, 5:59 PM

## 2022-05-13 NOTE — Progress Notes (Signed)
OT Cancellation Note  Patient Details Name: Patricia [name] MRN: [id number] DOB: [birth year]   Cancelled Treatment:    Reason Eval/Treat Not Completed: Patient at procedure or test/ unavailable (2nd attempt this date, pt now with SLP. OT to continue efforts for evaluation.)  Elliot Cousin 05/13/2022, 3:18 PM

## 2022-05-14 DIAGNOSIS — A419 Sepsis, unspecified organism: Secondary | ICD-10-CM | POA: Diagnosis not present

## 2022-05-14 LAB — GLUCOSE, CAPILLARY
Glucose-Capillary: 105 mg/dL — ABNORMAL HIGH (ref 70–99)
Glucose-Capillary: 109 mg/dL — ABNORMAL HIGH (ref 70–99)
Glucose-Capillary: 119 mg/dL — ABNORMAL HIGH (ref 70–99)
Glucose-Capillary: 120 mg/dL — ABNORMAL HIGH (ref 70–99)
Glucose-Capillary: 122 mg/dL — ABNORMAL HIGH (ref 70–99)
Glucose-Capillary: 127 mg/dL — ABNORMAL HIGH (ref 70–99)

## 2022-05-14 LAB — HEPATITIS B SURFACE ANTIBODY, QUANTITATIVE: Hep B S AB Quant (Post): 57.2 m[IU]/mL (ref >9.9)

## 2022-05-14 LAB — CBC
HCT: 21.1 % — ABNORMAL LOW (ref 36.0–46.0)
Hemoglobin: 7 g/dL — ABNORMAL LOW (ref 12.0–15.0)
MCH: 28.3 pg (ref 26.0–34.0)
MCHC: 33.2 g/dL (ref 30.0–36.0)
MCV: 85.4 fL (ref 80.0–100.0)
Platelets: 201 10*3/uL (ref 150–400)
RBC: 2.47 MIL/uL — ABNORMAL LOW (ref 3.87–5.11)
RDW: 16.4 % — ABNORMAL HIGH (ref 11.5–15.5)
WBC: 8.8 10*3/uL (ref 4.0–10.5)
nRBC: 0 % (ref 0.0–0.2)

## 2022-05-14 LAB — RENAL FUNCTION PANEL
Albumin: <1.5 g/dL — ABNORMAL LOW (ref 3.5–5.0)
Anion gap: 14 (ref 5–15)
BUN: 73 mg/dL — ABNORMAL HIGH (ref 6–20)
CO2: 22 mmol/L (ref 22–32)
Calcium: 8 mg/dL — ABNORMAL LOW (ref 8.9–10.3)
Chloride: 103 mmol/L (ref 98–111)
Creatinine, Ser: 4.19 mg/dL — ABNORMAL HIGH (ref 0.44–1.00)
GFR, Estimated: 12 mL/min — ABNORMAL LOW (ref >60)
Glucose, Bld: 98 mg/dL (ref 70–99)
Phosphorus: 6.2 mg/dL — ABNORMAL HIGH (ref 2.5–4.6)
Potassium: 4.4 mmol/L (ref 3.5–5.1)
Sodium: 139 mmol/L (ref 135–145)

## 2022-05-14 LAB — MAGNESIUM: Magnesium: 2 mg/dL (ref 1.7–2.4)

## 2022-05-14 MED ORDER — CEFAZOLIN SODIUM-DEXTROSE 2-4 GM/100ML-% IV SOLN
2.0000 g | INTRAVENOUS | Status: AC
  Administered 2022-05-14: 2 g via INTRAVENOUS
  Filled 2022-05-14: qty 100

## 2022-05-14 MED ORDER — DARBEPOETIN ALFA 100 MCG/0.5ML IJ SOSY
100.0000 ug | PREFILLED_SYRINGE | INTRAMUSCULAR | Status: DC
  Filled 2022-05-14: qty 0.5

## 2022-05-14 NOTE — Progress Notes (Signed)
RT at bedside with Speech Therapy for a PMV trial. Patient's sats remained stable throughout the trial. Patient placed back on full vent support to rest.

## 2022-05-14 NOTE — Progress Notes (Signed)
Nutrition Follow-up  DOCUMENTATION CODES:   Not applicable  INTERVENTION:   Tube Feeding via Cortrak:  Peptamen 1.5 at 50 ml/hr Pro-Source TF20 60 mL This provides 102 g of protein, 1880 kcals, 912 mL of free water  Add Juven BID, each packet provides 80 calories, 8 grams of carbohydrate, 2.5  grams of protein (collagen), 7 grams of L-arginine and 7 grams of L-glutamine; supplement contains CaHMB, Vitamins C, E, B12 and Zinc to promote wound healing  Recommend changing colace to prn.  Pt at risk for multiple vitamin deficiencies; recommend checking CRP, Copper, Zinc, Vitamin C, B6. Folate and B12 have already been checked   Monitor potassium levels; pt would like benefit from banatrol TF but would not recommend with current potassium with iHD   NUTRITION DIAGNOSIS:   Inadequate oral intake related to inability to eat as evidenced by NPO status (SBO).  Being addressed via TF  GOAL:   Patient will meet greater than or equal to 90% of their needs Progressing  MONITOR:   Vent status, Labs, Weight trends, TF tolerance, Skin, I & O's, Other (Comment) (TPN)  REASON FOR ASSESSMENT:   Consult Enteral/tube feeding initiation and management (trickle tube feeds)  ASSESSMENT:   Pt with hx of bipolar disorder, HTN, HLD, GERD, chronic bronchitis, GERD, DM type 2, and multiple prior abdominal surgeries presented to hospital for planned repair of a spigelian hernia.  09/26 - s/p robotic incisional hernia repair with mesh, extensive LOA 10/02 - transferred to ICU, pressors started, NG tube placed for decompression, TPN start 10/03 - intubated, NG tube exchanged for OG tube 10/04 - CRRT start, OG tube removed and replaced, concern for STEMI s/p L heart cath and coronary angiography without occlusion 10/08 - extubated, later re-intubated 10/09 - trickle TF initiated 10/12 - TF increased to goal of 50 10/13 - Cortrak placed 10/14 - CRRT discontinued, Extubated, Re-Intubated, Trach  placed 10/15 - TPN discontinued 10/16 - First iHD  Pt remains on vent support with TC as able, Off presors. Plan for iHD today; currently on MWF schedule, iHD today with 3L removed. Tunneled HD cath placed today  Pt with DTI to upper lip from device related pressure. [facility] RN consulted today for evaluation and recommendations  Tolerating Peptamen 1.5 infusing at 50 ml/hr via Cortrak tube  +liquid stool via rectal tube-100 mL today, 50 mL documented yesterday, miralax changed to prn 10/16, remains on scheduled colace. Noted nutrisource fiber added today. Recommend stopping scheduled colace for now, change to prn  Current wt 115.5 kg; +mild/moderate edema still present. Admit wt 109 kg. Net +5 L  Labs: BUN 112, Creatinine 5.96, phosphorus 7.8 (H), potassium 5.0 (wdl) Meds: rena-vite, ss novolog, novolog q 4 hours, aranesp    Diet Order:   Diet Order             Diet NPO time specified  Diet effective midnight                   EDUCATION NEEDS:   No education needs have been identified at this time  Skin:  Skin Assessment: Skin Integrity Issues: Skin Integrity Issues:: DTI DTI: upper lip from ET tube  Last BM:  10/18 rectal tube  Height:   Ht Readings from Last 1 Encounters:  05/10/22 '5\' 4"'$  (1.626 m)    Weight:   Wt Readings from Last 1 Encounters:  05/15/22 115.5 kg    Ideal Body Weight:  54.5 kg  BMI:  Body mass index is  43.71 kg/m.  Estimated Nutritional Needs:   Kcal:  1800-2000 kcal/d  Protein:  90-110 mL  Fluid:  1000 mL plus UOP    Kerman Passey MS, RDN, LDN, CNSC Registered Dietitian 3 Clinical Nutrition RD Pager and On-Call Pager Number Located in [place]

## 2022-05-14 NOTE — Consult Note (Addendum)
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 consolidation or atelectasis and left basilar atelectatic bands. Stable overall aeration. Cardiomegaly. Electronically Signed   By: Telford Nab M.D.   On: 05/05/2022 07:29   DG CHEST PORT 1 VIEW  Result Date: 05/04/2022 CLINICAL DATA:  Cough.  OG tube present. EXAM: PORTABLE CHEST 1 VIEW COMPARISON:  05/04/2022 and prior radiographs FINDINGS: Endotracheal tube with tip 3.5 cm above the carina, RIGHT IJ central venous catheter with tip overlying the mid SVC, enteric tube with tip overlying the mid stomach, and catheter with tip overlying the LEFT main pulmonary artery again noted. This is a mildly low volume study with mild bibasilar atelectasis identified. There is no evidence of pneumothorax or large pleural effusion. IMPRESSION: 1. Unchanged appearance of the chest with support apparatus as  described. 2. Mild bibasilar atelectasis. Electronically Signed   By: Margarette Canada M.D.   On: 05/04/2022 16:18   [place] EKG SITE RITE  Result Date: 05/04/2022 If Site Rite image not attached, placement could not be confirmed due to current cardiac rhythm.  DG CHEST PORT 1 VIEW  Result Date: 05/04/2022 CLINICAL DATA:  Acute congestive heart failure EXAM: PORTABLE CHEST 1 VIEW COMPARISON:  Chest x-ray May 03, 2022 FINDINGS: Slight retraction of the inferiorly placed pulmonary arterial catheter, with the tip projecting over the left main pulmonary artery. Otherwise, stable support apparatus. Unchanged mediastinal contours and cardiomegaly. Bibasilar linear opacities, likely atelectasis. Small left pleural effusion. No large pneumothorax. No acute osseous abnormality. The visualized upper abdomen is unremarkable. IMPRESSION: 1. Slight retraction of the pulmonary artery catheter, with the tip projecting over the left main pulmonary artery. 2. Bibasilar atelectasis and small right pleural effusion. Electronically Signed   By: Beryle Flock M.D.   On: 05/04/2022 10:34   CARDIAC CATHETERIZATION  Addendum Date: 05/03/2022   1.  Normal right dominant circulation without of thrombotic occlusion, spasm, or dissection. 2.  Cardiogenic shock with a cardiac output of 3.96 L/min and a cardiac index of 1.87 L/min/m.  The mean RA pressure was 13 mmHg, the mean wedge pressure was 19 mmHg, and the mean PA pressure was 29 mmHg; the PAPI is 0.7.  LVEDP is 16 mmHg.  The MAP at this time was 100 mmHg with the patient on both norepinephrine and vasopressin.  Dobutamine 5 mcg/kg/min was initiated, pressors were stopped and MAP goal of around 70 to 75 mmHg will be pursued.  This was discussed with pulmonary critical care. Recommendations: Continue dobutamine with  map goal of 70 to 75 mmHg and resume CRRT for volume removal and management of acid-base status.  Result Date: 05/03/2022 1.  Normal right dominant circulation  without of thrombotic occlusion, spasm, or dissection. 2.  Cardiogenic shock with a cardiac output of 3.96 L/min and a cardiac index of 1.87 L/min/m.  The mean RA pressure was 13 mmHg, the mean wedge pressure was 19 mmHg, and the mean PA pressure was 29 mmHg; the PAPI is 0.7.  LVEDP is 16 mmHg.  The MAP at this time was 100 mmHg with the patient on both norepinephrine and vasopressin.  Dobutamine 5 mcg/kg/min was initiated, pressors were stopped and MAP goal of around 70 to 75 mmHg will be pursued.  This was discussed with pulmonary critical care. Recommendations: Continue dobutamine with map goal of 70 to 75 mmHg and resume CRRT for volume removal and management of acid-base status.   ECHOCARDIOGRAM LIMITED  Result Date: 05/03/2022    ECHOCARDIOGRAM LIMITED REPORT   Patient Name:   BEDA DULA Date of Exam: 05/02/2022 Medical Rec #:  [id number]          Height:       64.0 in Accession #:    [id number]         Weight:       241.0 lb Date of Birth:  1971/06/24          BSA:          2.118 m Patient Age:    74 years           BP:           117/79 mmHg Patient Gender: F                  HR:           114 bpm. Exam Location:  Inpatient Procedure: Limited Echo, Cardiac Doppler, Color Doppler and Intracardiac            Opacification Agent Indications:    I50.40* Unspecified combined systolic (congestive) and diastolic                 (congestive) heart failure  History:        Patient has prior history of Echocardiogram examinations, most                 recent 04/30/2022. Abnormal ECG, Arrythmias:Tachycardia,                 Signs/Symptoms:Bacteremia and Altered Mental Status; Risk                 Factors:Hypertension and Diabetes.  Sonographer:    Roseanna Rainbow RDCS Referring Phys: Marlyce Huge, NICOLE  Sonographer Comments: Technically difficult study due to poor echo windows, patient is obese and echo performed with patient supine and on artificial respirator. Image acquisition challenging due to patient body  habitus and Image acquisition challenging due to respiratory motion. IMPRESSIONS  1. Left ventricular ejection fraction, by estimation, is 20%. The left ventricle has severely decreased function. The left ventricle demonstrates global hypokinesis.  2. Right ventricular systolic function is moderately reduced. The right ventricular size is normal. Tricuspid regurgitation signal is inadequate for assessing PA pressure.  3. The mitral valve is grossly normal. Mild mitral valve regurgitation.  4. The aortic valve is grossly normal.  5. There is borderline dilatation of the ascending aorta, measuring 38 mm. FINDINGS  Left Ventricle: Left ventricular ejection fraction, by estimation, is 20%. The left  ventricle has severely decreased function. The left ventricle demonstrates global hypokinesis. Definity contrast agent was given IV to delineate the left ventricular endocardial borders. There is borderline left ventricular hypertrophy. Right Ventricle: The right ventricular size is normal. Right ventricular systolic function is moderately reduced. Tricuspid regurgitation signal is inadequate for assessing PA pressure. Pericardium: Presence of epicardial fat layer. Mitral Valve: The mitral valve is grossly normal. Mild mitral valve regurgitation. Aortic Valve: The aortic valve is grossly normal. Aorta: The aortic root is normal in size and structure. There is borderline dilatation of the ascending aorta, measuring 38 mm. LEFT VENTRICLE PLAX 2D LVIDd:         4.85 cm LVIDs:         4.25 cm LV PW:         0.95 cm LV IVS:        1.05 cm  LV Volumes (MOD) LV vol d, MOD A2C: 52.1 ml LV vol d, MOD A4C: 108.0 ml LV vol s, MOD A2C: 48.9 ml LV vol s, MOD A4C: 86.5 ml LV SV MOD A2C:     3.2 ml LV SV MOD A4C:     108.0 ml LV SV MOD BP:      13.2 ml LEFT ATRIUM         Index LA diam:    3.40 cm 1.61 cm/m   AORTA Ao Root diam: 3.30 cm Ao Asc diam:  3.80 cm Cherlynn Kaiser MD Electronically signed by Cherlynn Kaiser MD Signature  Date/Time: 05/03/2022/7:44:53 AM    Final    DG CHEST PORT 1 VIEW  Result Date: 05/03/2022 CLINICAL DATA:  Septic shock.  Ventilator dependence. EXAM: PORTABLE CHEST 1 VIEW COMPARISON:  05/02/2022 FINDINGS: 0552 hours. Endotracheal tube tip is 3.2 cm above the base of the carina. Right IJ catheter tip overlies the mid to distal SVC level. Right-sided PICC line tip overlies the region of the right atrium. Inferiorly placed pulmonary artery catheter tip is in the left lower lobe pulmonary artery The NG tube passes into the stomach although the distal tip position is not included on the film. Left base atelectasis again noted. No evidence for pneumothorax or substantial pleural effusion. The cardio pericardial silhouette is enlarged. IMPRESSION: 1. Stable exam. Left base atelectasis without new or progressive interval findings. Electronically Signed   By: Misty Stanley M.D.   On: 05/03/2022 06:14   DG Abd 1 View  Result Date: 05/02/2022 CLINICAL DATA:  Ileus. EXAM: ABDOMEN - 1 VIEW COMPARISON:  April 30, 2022. FINDINGS: Limited exam due to body habitus. No definite abnormal bowel dilatation is noted. Distal tip of nasogastric tube is seen in proximal stomach. IMPRESSION: Limited exam due to body habitus. No definite evidence of abnormal bowel dilatation. Electronically Signed   By: Marijo Conception M.D.   On: 05/02/2022 14:54   DG CHEST PORT 1 VIEW  Result Date: 05/02/2022 CLINICAL DATA:  CHF, placement of Swan-Ganz catheter EXAM: PORTABLE CHEST 1 VIEW COMPARISON:  Previous studies including the examination of 05/01/2022 FINDINGS: Tip of endotracheal tube is 3.9 cm above the carina. There is interval removal of left IJ central venous catheter. Right IJ central venous catheter is seen with its tip in superior vena cava. Tip of right PICC line is seen in the region of right atrium. Enteric tube is noted traversing the esophagus. There is interval placement of Swan-Ganz catheter through the lower extremity with  its tip in the region of left lower lobe pulmonary artery. There are linear  densities in left lower lung field. There are no signs of alveolar pulmonary edema or new focal infiltrates. There is no pleural effusion or pneumothorax. IMPRESSION: Tip of Swan-Ganz catheter is seen in the course of left lower lobe pulmonary artery. There are linear densities in left lower lung fields suggesting subsegmental atelectasis. Other support devices as described in the body of the report. Electronically Signed   By: Elmer Picker M.D.   On: 05/02/2022 08:53   DG CHEST PORT 1 VIEW  Result Date: 05/01/2022 CLINICAL DATA:  Central line placement. EXAM: PORTABLE CHEST 1 VIEW COMPARISON:  One-view chest x-ray 04/30/2022. FINDINGS: Endotracheal tube is stable, 3.7 cm above the carina. Side port of the NG tube is in the fundus the stomach. A left IJ line is stable, terminating at the cavoatrial junction. Right-sided PICC line and extends just beyond the left IJ line. A right IJ line is new, terminating above the cavoatrial junction. No pneumothorax is present. Heart size is exaggerate by low lung volumes. Left greater than right basilar airspace disease is stable. IMPRESSION: 1. Interval placement of right IJ line without radiographic evidence for complication. 2. Stable left IJ line. 3. Stable left greater than right basilar airspace disease. 4. Stable endotracheal tube, 3.7 cm above the carina. 5. Low lung volumes with persistent left lower lobe airspace disease concerning for pneumonia or aspiration. Electronically Signed   By: San Morelle M.D.   On: 05/01/2022 11:51   DG CHEST PORT 1 VIEW  Result Date: 04/30/2022 CLINICAL DATA:  Mental status change EXAM: PORTABLE CHEST 1 VIEW COMPARISON:  Chest x-ray dated April 30, 2022 FINDINGS: Stable position of ET tube, right arm PICC, and left IJ line. Enteric tube with tip over the proximal stomach and side port near the GE junction, recommend advancement for optimal  positioning. Cardiac and mediastinal contours are unchanged. Similar retrocardiac opacities. Increased right lower lung perihilar opacities. No evidence of large pleural effusion or pneumothorax. IMPRESSION: 1. Enteric tube with tip over the proximal stomach and side port near the GE junction, recommend advancement for optimal positioning. 2. Increased right lower lung perihilar opacities, may represent atelectasis or infection/aspiration. 3. Similar left retrocardiac opacities. Electronically Signed   By: Yetta Glassman M.D.   On: 04/30/2022 16:15   ECHOCARDIOGRAM COMPLETE  Result Date: 04/30/2022    ECHOCARDIOGRAM REPORT   Patient Name:   ZAYNAB CHIPMAN [facility] Date of Exam: 04/30/2022 Medical Rec #:  [id number]          Height:       64.0 in Accession #:    [id number]         Weight:       241.0 lb Date of Birth:  05/03/1971          BSA:          2.118 m Patient Age:    18 years           BP:           96/63 mmHg Patient Gender: F                  HR:           125 bpm. Exam Location:  Inpatient Procedure: 2D Echo, Cardiac Doppler and Color Doppler Indications:    Bacteremia  History:        Patient has prior history of Echocardiogram examinations, most                 recent  07/20/2014. Sepsis; Risk Factors:Hypertension and                 Diabetes.  Sonographer:    Johny Chess RDCS Referring Phys: [place] Comments: Echo performed with patient supine and on artificial respirator. Image acquisition challenging due to patient body habitus. IMPRESSIONS  1. Left ventricular ejection fraction, by estimation, is 30 to 35%. The left ventricle has moderately decreased function. The left ventricle demonstrates global hypokinesis. Indeterminate diastolic filling due to E-A fusion.  2. Right ventricular systolic function is moderately reduced. The right ventricular size is normal. The estimated right ventricular systolic pressure is 62.9 mmHg.  3. Left atrial size was mildly dilated.  4. The  mitral valve is normal in structure. Mild to moderate mitral valve regurgitation.  5. The aortic valve is tricuspid. Aortic valve regurgitation is not visualized. No aortic stenosis is present.  6. The inferior vena cava is collapsed throughout the respiratory cycle, which is unusual during mechanical ventilation and suggests low right atrial pressure/hypovolemia. Comparison(s): Prior images unable to be directly viewed, comparison made by report only. Conclusion(s)/Recommendation(s): No evidence of valvular vegetations on this transthoracic echocardiogram. Consider a transesophageal echocardiogram to exclude infective endocarditis if clinically indicated. Flow velocities are low across all valves consistent with reduced cardiac output. Collapsed IVC suggests hypovolemia in addition to biventricular systolic dysfunction. FINDINGS  Left Ventricle: Left ventricular ejection fraction, by estimation, is 30 to 35%. The left ventricle has moderately decreased function. The left ventricle demonstrates global hypokinesis. The left ventricular internal cavity size was normal in size. There is no left ventricular hypertrophy. Indeterminate diastolic filling due to E-A fusion. Right Ventricle: The right ventricular size is normal. No increase in right ventricular wall thickness. Right ventricular systolic function is moderately reduced. The tricuspid regurgitant velocity is 2.24 m/s, and with an assumed right atrial pressure of 0 mmHg, the estimated right ventricular systolic pressure is 47.6 mmHg. Left Atrium: Left atrial size was mildly dilated. Right Atrium: Right atrial size was normal in size. Pericardium: There is no evidence of pericardial effusion. Mitral Valve: The mitral valve is normal in structure. Mild to moderate mitral valve regurgitation, with centrally-directed jet. Tricuspid Valve: The tricuspid valve is normal in structure. Tricuspid valve regurgitation is mild. Aortic Valve: The aortic valve is tricuspid.  Aortic valve regurgitation is not visualized. No aortic stenosis is present. Pulmonic Valve: The pulmonic valve was not well visualized. Pulmonic valve regurgitation is not visualized. Aorta: The aortic root and ascending aorta are structurally normal, with no evidence of dilitation. Venous: The inferior vena cava is collapsed throughout the respiratory cycle, which is unusual during mechanical ventilation and suggests low right atrial pressure/hypovolemia. IAS/Shunts: No atrial level shunt detected by color flow Doppler.  LEFT VENTRICLE PLAX 2D LVIDd:         4.90 cm LVIDs:         4.10 cm LV PW:         1.00 cm LV IVS:        0.80 cm LVOT diam:     1.90 cm LVOT Area:     2.84 cm  RIGHT VENTRICLE RV S prime:     8.16 cm/s TAPSE (M-mode): 1.0 cm LEFT ATRIUM           Index        RIGHT ATRIUM          Index LA diam:      3.90 cm 1.84 cm/m   RA  Area:     8.10 cm LA Vol (A2C): 40.2 ml 18.98 ml/m  RA Volume:   14.20 ml 6.71 ml/m LA Vol (A4C): 45.3 ml 21.39 ml/m   AORTA Ao Root diam: 3.30 cm Ao Asc diam:  3.50 cm TRICUSPID VALVE TR Peak grad:   20.1 mmHg TR Vmax:        224.00 cm/s  SHUNTS Systemic Diam: 1.90 cm Mihai Croitoru MD Electronically signed by Sanda Klein MD Signature Date/Time: 04/30/2022/11:08:35 AM    Final    DG Abd Portable 1V  Result Date: 04/30/2022 CLINICAL DATA:  OG tube placement EXAM: PORTABLE ABDOMEN - 1 VIEW COMPARISON:  KUB 1 day prior and same day CT chest/abdomen/pelvis FINDINGS: The enteric catheter is coiled in the stomach with the tip projecting over the region of the pylorus. There is diffuse gaseous distention of the bowel as seen on same day CT chest/abdomen/pelvis. IMPRESSION: Enteric catheter coiled in the stomach. Electronically Signed   By: Valetta Mole M.D.   On: 04/30/2022 10:22   DG CHEST PORT 1 VIEW  Result Date: 04/30/2022 CLINICAL DATA:  [id number]. Encounter for aspiration with ventilator dependent respiratory failure. EXAM: PORTABLE CHEST 1 VIEW COMPARISON:   Earlier portable chest today at 3:39 a.m. FINDINGS: 5:30 a.m. ETT tip is 2.9 cm from the carina. NGT enters the stomach but the tip is only a few cm in the stomach and should be advanced further in. Right PICC terminates in the distal SVC with left IJ line terminating in the upper right atrium. There is overlying monitor wiring. There are linear atelectatic bands in the left lung base as well as streaky infrahilar opacity which could be additional atelectasis or pneumonia. There is right mid perihilar linear atelectasis. Remaining lungs remain clear. There is cardiomegaly without evidence of CHF. Stable mediastinum with aortic tortuosity and ectasia. No acute osseous abnormality. IMPRESSION: Atelectasis or infiltrate in the left infrahilar area with linear atelectasis in the left base, right mid field. Overall aeration seems unchanged. NGT proximal side hole remains in the distal esophagus and should be advanced further in. Stable cardiomegaly. Electronically Signed   By: Telford Nab M.D.   On: 04/30/2022 07:29   CT CHEST ABDOMEN PELVIS WO CONTRAST  Result Date: 04/30/2022 CLINICAL DATA:  Bowel obstruction suspected. Worsening shock with lactate acidosis and bowel obstruction. EXAM: CT CHEST, ABDOMEN AND PELVIS WITHOUT CONTRAST TECHNIQUE: Multidetector CT imaging of the chest, abdomen and pelvis was performed following the standard protocol without IV contrast. RADIATION DOSE REDUCTION: This exam was performed according to the departmental dose-optimization program which includes automated exposure control, adjustment of the mA and/or kV according to patient size and/or use of iterative reconstruction technique. COMPARISON:  Abdominal CT from yesterday FINDINGS: CT CHEST FINDINGS Cardiovascular: Normal heart size. No pericardial effusion. Small volume gas in the right ventricle usually from IV access. Left IJ and right PICC with tips in expected position at the upper cavoatrial junction. Mediastinum/Nodes:  Fluid dilated esophagus containing contrast. No adenopathy. Lungs/Pleura: Endotracheal tube in good position with tip above the carina. Worsening atelectasis at the lung bases. Patchy airspace opacity in the right middle lobe. Musculoskeletal: No acute finding. Scoliosis. Chronic left medial clavicle and first rib deformities. CT ABDOMEN PELVIS FINDINGS Hepatobiliary: Hepatic steatosis.No evidence of biliary obstruction or stone. Pancreas: Unremarkable. Spleen: Unremarkable. Adrenals/Urinary Tract: Negative adrenals. No hydronephrosis or stone. Unremarkable bladder. Stomach/Bowel: Dilated and fluid-filled small bowel with transition point in the distal bowel at the level of the pelvis where there is  distortion of bowel loop suggesting scarring. Entero-enteric anastomosis is seen in the right lower quadrant. Mild mesenteric edema associated with the obstructed segments. Vascular/Lymphatic: No acute vascular abnormality. No mass or adenopathy. Reproductive:Hysterectomy Other: Ventral abdominal wall scarring with low-density component attributed to recent hernia repair. Anasarca. Few bubbles of subcutaneous gas presumably from recent surgery. No visible hernia. Musculoskeletal: No acute abnormalities. IMPRESSION: 1. Continued small bowel obstruction with transition in the pelvis where there is scarring. 2. Worsening atelectasis at the lung bases. Right middle lobe aspiration pneumonitis/pneumonia. The esophagus is diffusely fluid-filled. Electronically Signed   By: Jorje Guild M.D.   On: 04/30/2022 06:52   DG CHEST PORT 1 VIEW  Result Date: 04/30/2022 CLINICAL DATA:  Check central line placement EXAM: PORTABLE CHEST 1 VIEW COMPARISON:  04/30/2022 FINDINGS: Endotracheal tube and gastric catheter are noted. Gastric catheter should be advanced as the proximal side port lies in the distal esophagus. Left jugular central line is now seen in the mid superior vena cava. No pneumothorax is noted. Right PICC is noted in  satisfactory position. Lungs are well aerated. Left basilar atelectasis is noted similar to that seen on the prior exam. IMPRESSION: No pneumothorax following central line placement on the left. Gastric catheter should be advanced distally into the stomach. Stable left basilar atelectasis. Electronically Signed   By: Inez Catalina M.D.   On: 04/30/2022 03:50   Portable Chest x-ray  Result Date: 04/30/2022 CLINICAL DATA:  [id number].  Endotracheal tube placement film. EXAM: PORTABLE CHEST 1 VIEW COMPARISON:  Portable chest yesterday at 2:22 p.m. FINDINGS: 2:48 a.m. ETT is in place now with tip 2 cm from the carina. Right PICC tip at the superior cavoatrial junction. NGT is in place, again with the proximal side hole in the distal esophagus and should be advanced into the stomach for continued use. The cardiac size is stable.  Central vessels are normal caliber. The mediastinal configuration is normal.  The lungs are expiratory. There is increased linear atelectasis in both bases and streaky increased left infrahilar lower lobe opacity which could be atelectasis, new infiltrate or aspiration. The upper lung fields are entirely clear. No pleural effusion is seen. Multiple overlying monitor wires. IMPRESSION: Support devices as above.  ETT tip is 2 cm from the carina. NGT proximal side hole in the distal esophagus and should be advanced into the stomach for continued use. Expiratory exam with increased linear basilar atelectatic foci and increased streaky left infrahilar lower lobe opacity which could be atelectasis, pneumonia or aspiration. Stable mild cardiomegaly. Electronically Signed   By: Telford Nab M.D.   On: 04/30/2022 02:53   VAS [place] LOWER EXTREMITY VENOUS (DVT)  Result Date: 04/29/2022  Lower Venous DVT Study Patient Name:  NATALEY BAHRI [facility]  Date of Exam:   04/29/2022 Medical Rec #: [id number]           Accession #:    [id number] Date of Birth: 04-17-1971           Patient Gender: F Patient Age:   54  years Exam Location:  [facility] Procedure:      VAS [place] LOWER EXTREMITY VENOUS (DVT) Referring Phys: MICHAEL MACZIS --------------------------------------------------------------------------------  Indications: SOB, and Edema.  Comparison Study: No prior study Performing Technologist: Sharion Dove RVS  Examination Guidelines: A complete evaluation includes B-mode imaging, spectral Doppler, color Doppler, and power Doppler as needed of all accessible portions of each vessel. Bilateral testing is considered an integral part of a complete examination. Limited examinations for  reoccurring indications may be performed as noted. The reflux portion of the exam is performed with the patient in reverse Trendelenburg.  +---------+---------------+---------+-----------+----------+--------------+ RIGHT    CompressibilityPhasicitySpontaneityPropertiesThrombus Aging +---------+---------------+---------+-----------+----------+--------------+ CFV      Full           Yes      Yes                                 +---------+---------------+---------+-----------+----------+--------------+ SFJ      Full                                                        +---------+---------------+---------+-----------+----------+--------------+ FV Prox  Full                                                        +---------+---------------+---------+-----------+----------+--------------+ FV Mid   Full                                                        +---------+---------------+---------+-----------+----------+--------------+ FV DistalFull                                                        +---------+---------------+---------+-----------+----------+--------------+ PFV      Full                                                        +---------+---------------+---------+-----------+----------+--------------+ POP      Full           Yes      Yes                                  +---------+---------------+---------+-----------+----------+--------------+ PTV      Full                                                        +---------+---------------+---------+-----------+----------+--------------+ PERO     Full                                                        +---------+---------------+---------+-----------+----------+--------------+   +---------+---------------+---------+-----------+----------+--------------+ LEFT     CompressibilityPhasicitySpontaneityPropertiesThrombus Aging +---------+---------------+---------+-----------+----------+--------------+ CFV      Full  Yes      Yes                                 +---------+---------------+---------+-----------+----------+--------------+ SFJ      Full                                                        +---------+---------------+---------+-----------+----------+--------------+ FV Prox  Full                                                        +---------+---------------+---------+-----------+----------+--------------+ FV Mid   Full                                                        +---------+---------------+---------+-----------+----------+--------------+ FV DistalFull                                                        +---------+---------------+---------+-----------+----------+--------------+ PFV      Full                                                        +---------+---------------+---------+-----------+----------+--------------+ POP      Full           Yes      Yes                                 +---------+---------------+---------+-----------+----------+--------------+ PTV      Full                                                        +---------+---------------+---------+-----------+----------+--------------+ PERO     Full                                                         +---------+---------------+---------+-----------+----------+--------------+     Summary: BILATERAL: - No evidence of deep vein thrombosis seen in the lower extremities, bilaterally. -No evidence of popliteal cyst, bilaterally.   *See table(s) above for measurements and observations. Electronically signed by Servando Snare MD on 04/29/2022 at 7:51:28 PM.    Final    DG CHEST PORT 1 VIEW  Result Date: 04/29/2022 CLINICAL DATA:  Status post PICC central line placement. EXAM: PORTABLE  CHEST 1 VIEW COMPARISON:  Same day chest radiograph at 1131 hours; same-day CT abdomen pelvis at 0748 hours FINDINGS: Interval repositioning of the right upper extremity PICC with the tip now projecting at the level of the superior cavoatrial junction. Nasogastric tube tip is below the diaphragm with the side hole projecting at the level of the distal esophagus. Bibasilar subsegmental atelectasis as seen on same day CT abdomen pelvis. No new focal consolidation. No pleural effusion or pneumothorax. Heart is normal in size. IMPRESSION: Interval repositioning of the right upper extremity PICC with the tip now projecting at the level of the superior cavoatrial junction. Bibasilar subsegmental atelectasis, similar to earlier exam. Electronically Signed   By: Ileana Roup M.D.   On: 04/29/2022 14:41   DG Chest Port 1 View  Result Date: 04/29/2022 CLINICAL DATA:  PICC line placement EXAM: PORTABLE CHEST 1 VIEW COMPARISON:  April 10, 2022 FINDINGS: A right PICC line has been placed in the interval. A slight ir in the distal PICC line suggests the PICC line may extend slightly into the azygous vein. An NG tube terminates in the stomach. Infiltrate is identified in the left base. No other interval changes or acute abnormalities. IMPRESSION: 1. A right PICC line has been placed in the interval. The distal PICC line may extend slightly into the azygous vein. Consider repositioning. 2. Left basilar infiltrate. Electronically Signed   By:  Dorise Bullion III M.D.   On: 04/29/2022 11:48   [place] EKG SITE RITE  Result Date: 04/29/2022 If Site Rite image not attached, placement could not be confirmed due to current cardiac rhythm.  CT ABDOMEN PELVIS WO CONTRAST  Result Date: 04/29/2022 CLINICAL DATA:  51 year old female with suspected bowel obstruction. EXAM: CT ABDOMEN AND PELVIS WITHOUT CONTRAST TECHNIQUE: Multidetector CT imaging of the abdomen and pelvis was performed following the standard protocol without IV contrast. RADIATION DOSE REDUCTION: This exam was performed according to the departmental dose-optimization program which includes automated exposure control, adjustment of the mA and/or kV according to patient size and/or use of iterative reconstruction technique. COMPARISON:  CT of the abdomen and pelvis 02/07/2022. FINDINGS: Lower chest: Opacities in the dependent portions of the lower lobes of the lungs bilaterally, may reflect atelectasis and/or developing scarring. Nasogastric tube extending into the stomach. Hepatobiliary: Diffuse low attenuation throughout the hepatic parenchyma, indicative of a background of hepatic steatosis. No definite suspicious cystic or solid hepatic lesions are confidently identified on today's noncontrast CT examination. Unenhanced appearance of the gallbladder is normal. Pancreas: No definite pancreatic mass or peripancreatic fluid collections or inflammatory changes are noted on today's noncontrast CT examination. Spleen: Unremarkable. Adrenals/Urinary Tract: Unenhanced appearance of the kidneys, bilateral adrenal glands and urinary bladder is normal. No hydroureteronephrosis. Stomach/Bowel: Nasogastric tube extending into the mid body of the stomach. Numerous dilated loops of small bowel measuring up to 4.6 cm in diameter, with multiple air-fluid levels. There is a suture line in the anterior aspect of the lower abdomen, presumably from prior partial small bowel resection. An exact transition point is  not confidently identified, however, the distal ileum appears completely decompressed, suggesting a transition point in the distal small bowel. Appendix is not confidently identified may be surgically absent. Small amount of gas and stool noted in the colon. Vascular/Lymphatic: Atherosclerotic calcifications in the abdominal aorta. No definite lymphadenopathy noted in the abdomen or pelvis. Reproductive: Status post total abdominal hysterectomy and bilateral salpingo-oophorectomy. Other: No significant volume of ascites.  No pneumoperitoneum. Musculoskeletal: Soft tissue stranding in the  subcutaneous fat of the right anterior abdominal wall. There are no aggressive appearing lytic or blastic lesions noted in the visualized portions of the skeleton. IMPRESSION: 1. Findings are concerning for small bowel obstruction, likely with transition point in the distal small bowel, as above. Surgical consultation is recommended. 2. Interval development of dependent opacities in the lower lobes of the lungs bilaterally, which may reflect areas of atelectasis and/or developing scarring. 3. Hepatic steatosis. 4. Aortic atherosclerosis. 5. Additional incidental findings, as above. These results will be called to the ordering clinician or representative by the Radiologist Assistant, and communication documented in the PACS or [employer]. Electronically Signed   By: Vinnie Langton M.D.   On: 04/29/2022 08:01   DG Abd 1 View  Result Date: 04/29/2022 CLINICAL DATA:  Check gastric catheter placement EXAM: ABDOMEN - 1 VIEW COMPARISON:  None Available. FINDINGS: Gastric catheter is noted within the stomach. Persistent small bowel dilatation is noted. IMPRESSION: Gastric catheter within the stomach. Electronically Signed   By: Inez Catalina M.D.   On: 04/29/2022 03:09   DG Abd 1 View  Result Date: 04/29/2022 CLINICAL DATA:  NG tube placement. EXAM: ABDOMEN - 1 VIEW COMPARISON:  04/28/2022. FINDINGS: There is a dilated loop  of small bowel in the upper abdomen measuring up to 4.0 cm. An NG tube terminates in the stomach at the gastric fundus. No radio-opaque calculi or other significant radiographic abnormality are seen. Mild atelectasis is present at the left lung base. IMPRESSION: 1. Dilated loop of small bowel in the upper abdomen measuring up to 4 cm, possible ileus versus partial or early obstruction. 2. Enteric tube terminates in the stomach. Electronically Signed   By: Brett Fairy M.D.   On: 04/29/2022 01:55   DG Abd Portable 1V  Result Date: 04/28/2022 CLINICAL DATA:  [id number] in G-tube placement EXAM: PORTABLE ABDOMEN - 1 VIEW COMPARISON:  September 13th 2023 radiograph FINDINGS: Magdalene Molly is not visualized, limiting assessment. Enteric tube tip terminates over the expected region of the proximal stomach. There are dilated loops of small bowel within the upper abdomen. Bibasilar atelectasis. Incomplete visualization of the pelvis. IMPRESSION: 1. Enteric tube terminates over the expected region of the proximal stomach. 2. Diffuse gaseous dilation of multiple loops of small bowel. Differential considerations include small-bowel obstruction or ileus. Consider further evaluation with CT abdomen pelvis with contrast if concern for obstruction. Electronically Signed   By: Valentino Saxon M.D.   On: 04/28/2022 17:15    Labs:  CBC: Recent Labs    05/12/22 0454 05/12/22 1455 05/13/22 0314 05/13/22 0930 05/14/22 0520  WBC 15.4*  --  12.9* 13.4* 8.8  HGB 6.2* 7.1* 6.8* 8.8* 7.0*  HCT 19.2* 21.6* 21.6* 26.4* 21.1*  PLT 177  --  189 197 201    COAGS: Recent Labs    01/15/22 0147  INR 0.9    BMP: Recent Labs    05/12/22 1544 05/13/22 0314 05/13/22 1739 05/14/22 0520  NA 136 138 134* 139  K 4.9 5.2* 4.1 4.4  CL 107 109 99 103  CO2 20* 20* 24 22  GLUCOSE 146* 118* 126* 98  BUN 76* 92* 59* 73*  CALCIUM 7.7* 7.8* 7.6* 8.0*  CREATININE 3.39* 4.32* 3.25* 4.19*  GFRNONAA 16* 12* 16* 12*    LIVER  FUNCTION TESTS: Recent Labs    05/01/22 0416 05/01/22 1600 05/01/22 2325 05/02/22 0410 05/05/22 5885 05/05/22 1510 05/09/22 0836 05/09/22 1549 05/12/22 1544 05/13/22 0314 05/13/22 1739 05/14/22 0520  BILITOT 1.8*  --  1.7*  --  1.3*  --  1.6*  --   --   --   --   --   AST 1,115*  --  700*  --  331*  --  385*  --   --   --   --   --   ALT 632*  --  439*  --  248*  --  324*  --   --   --   --   --   ALKPHOS 62  --  125  --  167*  --  216*  --   --   --   --   --   PROT 5.0*  --  4.5*  --  4.6*  --  5.2*  --   --   --   --   --   ALBUMIN 2.3*   < > 1.6*   < > <1.5*   < > <1.5*   < > <1.5* <1.5* 1.8* <1.5*   < > = values in this interval not displayed.    TUMOR MARKERS: No results for input(s): "AFPTM", "CEA", "CA199", "CHROMGRNA" in the last 8760 hours.  Assessment and Plan:  Scheduled for tunneled dialysis catheter placement per Nephrology No renal recovery per note Temp 100.8 this am Wbc- wnl Plan for tunneled cath placement in IR when can  Risks and benefits discussed with the patient's sister [facility] via phone including, but not limited to bleeding, infection, vascular injury, pneumothorax which may require chest tube placement, air embolism or even death  All questions were answered, Brayton Layman is agreeable to proceed. Consent signed and in chart.   Thank you for this interesting consult.  I greatly enjoyed meeting MATTALYNN CRANDLE and look forward to participating in their care.  A copy of this report was sent to the requesting provider on this date.  Electronically Signed: Lavonia Drafts, PA-C 05/14/2022, 10:12 AM   I spent a total of 20 Minutes    in face to face in clinical consultation, greater than 50% of which was counseling/coordinating care for tunneled HD cath placement

## 2022-05-14 NOTE — Plan of Care (Signed)
Problem: Education: Goal: Knowledge of General Education information will improve Description: Including pain rating scale, medication(s)/side effects and non-pharmacologic comfort measures Outcome: Not Progressing   Problem: Health Behavior/Discharge Planning: Goal: Ability to manage health-related needs will improve Outcome: Not Progressing   Problem: Clinical Measurements: Goal: Ability to maintain clinical measurements within normal limits will improve Outcome: Not Progressing Goal: Will remain free from infection Outcome: Not Progressing Goal: Diagnostic test results will improve Outcome: Not Progressing Goal: Respiratory complications will improve Outcome: Not Progressing Goal: Cardiovascular complication will be avoided Outcome: Not Progressing   Problem: Activity: Goal: Risk for activity intolerance will decrease Outcome: Not Progressing   Problem: Nutrition: Goal: Adequate nutrition will be maintained Outcome: Not Progressing   Problem: Coping: Goal: Level of anxiety will decrease Outcome: Not Progressing   Problem: Elimination: Goal: Will not experience complications related to bowel motility Outcome: Not Progressing Goal: Will not experience complications related to urinary retention Outcome: Not Progressing   Problem: Pain Managment: Goal: General experience of comfort will improve Outcome: Not Progressing   Problem: Safety: Goal: Ability to remain free from injury will improve Outcome: Not Progressing   Problem: Skin Integrity: Goal: Risk for impaired skin integrity will decrease Outcome: Not Progressing   Problem: Fluid Volume: Goal: Hemodynamic stability will improve Outcome: Not Progressing   Problem: Clinical Measurements: Goal: Diagnostic test results will improve Outcome: Not Progressing Goal: Signs and symptoms of infection will decrease Outcome: Not Progressing   Problem: Respiratory: Goal: Ability to maintain adequate ventilation  will improve Outcome: Not Progressing   Problem: Education: Goal: Ability to describe self-care measures that may prevent or decrease complications (Diabetes Survival Skills Education) will improve Outcome: Not Progressing Goal: Individualized Educational Video(s) Outcome: Not Progressing   Problem: Coping: Goal: Ability to adjust to condition or change in health will improve Outcome: Not Progressing   Problem: Fluid Volume: Goal: Ability to maintain a balanced intake and output will improve Outcome: Not Progressing   Problem: Health Behavior/Discharge Planning: Goal: Ability to identify and utilize available resources and services will improve Outcome: Not Progressing Goal: Ability to manage health-related needs will improve Outcome: Not Progressing   Problem: Metabolic: Goal: Ability to maintain appropriate glucose levels will improve Outcome: Not Progressing   Problem: Nutritional: Goal: Maintenance of adequate nutrition will improve Outcome: Not Progressing Goal: Progress toward achieving an optimal weight will improve Outcome: Not Progressing   Problem: Skin Integrity: Goal: Risk for impaired skin integrity will decrease Outcome: Not Progressing   Problem: Tissue Perfusion: Goal: Adequacy of tissue perfusion will improve Outcome: Not Progressing   Problem: Activity: Goal: Ability to tolerate increased activity will improve Outcome: Not Progressing   Problem: Respiratory: Goal: Ability to maintain a clear airway and adequate ventilation will improve Outcome: Not Progressing   Problem: Education: Goal: Understanding of CV disease, CV risk reduction, and recovery process will improve Outcome: Not Progressing Goal: Individualized Educational Video(s) Outcome: Not Progressing   Problem: Activity: Goal: Ability to return to baseline activity level will improve Outcome: Not Progressing   Problem: Cardiovascular: Goal: Ability to achieve and maintain adequate  cardiovascular perfusion will improve Outcome: Not Progressing Goal: Vascular access site(s) Level 0-1 will be maintained Outcome: Not Progressing   Problem: Health Behavior/Discharge Planning: Goal: Ability to safely manage health-related needs after discharge will improve Outcome: Not Progressing   Problem: Education: Goal: 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 Outcome: Not Progressing   Problem: Activity: Goal: Ability to tolerate increased activity will improve Outcome: Not Progressing   Problem:  Health Behavior/Discharge Planning: Goal: Ability to manage 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 Outcome: Not Progressing   Problem: Respiratory: Goal: Patent airway maintenance will improve Outcome: Not Progressing  Patient total care recent trach weans on vent but still vent dependent. [place] with Tube feedings patient on hemodialysis and anuric.

## 2022-05-14 NOTE — Progress Notes (Signed)
13 Days Post-Op   Subjective/Chief Complaint: Some trach collar and PMV today.  Objective: Vital signs in last 24 hours: Temp:  [99.3 F (37.4 C)-100.8 F (38.2 C)] 99.6 F (37.6 C) (10/17 1606) Pulse Rate:  [87-114] 113 (10/17 1600) Resp:  [21-36] 27 (10/17 1600) BP: (86-125)/(50-96) 110/70 (10/17 1600) SpO2:  [100 %] 100 % (10/17 1600) Arterial Line BP: (120-132)/(54-70) 120/54 (10/16 1745) FiO2 (%):  [35 %-40 %] 40 % (10/17 1600) Weight:  [114.8 kg] 114.8 kg (10/17 0415) Last BM Date : 05/14/22  Intake/Output from previous day: 10/16 0701 - 10/17 0700 In: 1741.8 [I.V.:832.1; NG/GT:760; IV Piggyback:149.7] Out: 2950 [Stool:650] Intake/Output this shift: Total I/O In: 569.4 [I.V.:119.4; NG/GT:450] Out: -   Exam: Sitting up in bed Trach, on vent Follows commands Abdomen soft, obese, mild TTP. Incisions c/d/i  Lab Results:  Recent Labs    05/13/22 0930 05/14/22 0520  WBC 13.4* 8.8  HGB 8.8* 7.0*  HCT 26.4* 21.1*  PLT 197 201    BMET Recent Labs    05/13/22 1739 05/14/22 0520  NA 134* 139  K 4.1 4.4  CL 99 103  CO2 24 22  GLUCOSE 126* 98  BUN 59* 73*  CREATININE 3.25* 4.19*  CALCIUM 7.6* 8.0*    PT/INR No results for input(s): "LABPROT", "INR" in the last 72 hours. ABG No results for input(s): "PHART", "HCO3" in the last 72 hours.  Invalid input(s): "PCO2", "PO2"   Studies/Results: No results found.  Anti-infectives: Anti-infectives (From admission, onward)    Start     Dose/Rate Route Frequency Ordered Stop   05/15/22 0000  ceFAZolin (ANCEF) IVPB 2g/100 mL premix        2 g 200 mL/hr over 30 Minutes Intravenous To Radiology 05/14/22 1308 05/16/22 0000   05/13/22 2134  meropenem (MERREM) 500 mg in sodium chloride 0.9 % 100 mL IVPB        500 mg 200 mL/hr over 30 Minutes Intravenous Every 24 hours 05/13/22 0815 05/17/22 2144   05/13/22 1000  meropenem (MERREM) 500 mg in sodium chloride 0.9 % 100 mL IVPB  Status:  Discontinued        500  mg 200 mL/hr over 30 Minutes Intravenous Every 12 hours 05/12/22 0940 05/12/22 0946   05/12/22 2200  meropenem (MERREM) 500 mg in sodium chloride 0.9 % 100 mL IVPB  Status:  Discontinued        500 mg 200 mL/hr over 30 Minutes Intravenous Every 12 hours 05/12/22 0946 05/13/22 0815   05/12/22 2000  meropenem (MERREM) 1 g in sodium chloride 0.9 % 100 mL IVPB  Status:  Discontinued        1 g 200 mL/hr over 30 Minutes Intravenous  Once 05/12/22 0940 05/12/22 0946   05/06/22 1530  meropenem (MERREM) 1 g in sodium chloride 0.9 % 100 mL IVPB  Status:  Discontinued        1 g 200 mL/hr over 30 Minutes Intravenous Every 8 hours 05/06/22 1438 05/12/22 0940   05/04/22 1000  vancomycin (VANCOCIN) IVPB 1000 mg/200 mL premix  Status:  Discontinued        1,000 mg 200 mL/hr over 60 Minutes Intravenous Every 24 hours 05/03/22 0802 05/08/22 0913   05/03/22 0845  vancomycin (VANCOREADY) IVPB 2000 mg/400 mL        2,000 mg 200 mL/hr over 120 Minutes Intravenous  Once 05/03/22 0751 05/03/22 1114   05/01/22 1800  piperacillin-tazobactam (ZOSYN) IVPB 3.375 g  Status:  Discontinued  3.375 g 12.5 mL/hr over 240 Minutes Intravenous Every 6 hours 05/01/22 1426 05/01/22 1427   05/01/22 1515  piperacillin-tazobactam (ZOSYN) IVPB 3.375 g  Status:  Discontinued        3.375 g 100 mL/hr over 30 Minutes Intravenous Every 6 hours 05/01/22 1428 05/06/22 1438   04/30/22 1400  piperacillin-tazobactam (ZOSYN) IVPB 2.25 g  Status:  Discontinued        2.25 g 100 mL/hr over 30 Minutes Intravenous Every 8 hours 04/30/22 1325 05/01/22 1426   04/29/22 1530  piperacillin-tazobactam (ZOSYN) IVPB 3.375 g  Status:  Discontinued        3.375 g 12.5 mL/hr over 240 Minutes Intravenous Every 8 hours 04/29/22 1433 04/30/22 1325   04/29/22 1145  metroNIDAZOLE (FLAGYL) IVPB 500 mg  Status:  Discontinued        500 mg 100 mL/hr over 60 Minutes Intravenous Every 12 hours 04/29/22 1047 04/29/22 1433   04/29/22 1145  vancomycin  (VANCOCIN) IVPB 1000 mg/200 mL premix  Status:  Discontinued        1,000 mg 200 mL/hr over 60 Minutes Intravenous  Once 04/29/22 1047 04/29/22 1055   04/29/22 1145  vancomycin (VANCOREADY) IVPB 1500 mg/300 mL  Status:  Discontinued        1,500 mg 150 mL/hr over 120 Minutes Intravenous Every 24 hours 04/29/22 1055 04/29/22 1357   04/29/22 1045  ceFEPIme (MAXIPIME) 2 g in sodium chloride 0.9 % 100 mL IVPB  Status:  Discontinued        2 g 200 mL/hr over 30 Minutes Intravenous Every 8 hours 04/29/22 0954 04/29/22 1433   04/23/22 0730  ceFAZolin (ANCEF) IVPB 2g/100 mL premix        2 g 200 mL/hr over 30 Minutes Intravenous On call to O.R. 04/23/22 0726 04/23/22 5701       Assessment/Plan: Ms. Sluder is s/p robotic incisional hernia repair on 04/23/22 -Postop ileus complicated by aspiration: Ileus resolved, having bowel function.  Tolerating tube feeds at goal Abdomen fairly benign on exam. 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 Tunneled catheter, no evidence of renal recovery   Resp failure -Trach for prolonged ventilator wean     Felicie Morn, MD [facility] Surgery 05/14/2022, 5:22 PM Please see Amion for pager number during day hours 7:00am-4:30pm

## 2022-05-14 NOTE — Progress Notes (Signed)
Patient ID: Patricia [name], female   DOB: November 04, 1970, 51 y.o.   MRN: [id number] S: bumex given yesterday, no urine output charted. Hgb 6.8 this am, 1u prbc ordered overnight. When asked how she is doing, patient lifted her hand up to indicate 'so-so'. O:BP (!) 100/57 (BP Location: Right Wrist)   Pulse 96   Temp 100.3 F (37.9 C) (Oral)   Resp (!) 25   Ht '5\' 4"'$  (1.626 m)   Wt 114.8 kg   LMP 07/07/2014 (Approximate)   SpO2 100%   BMI 43.44 kg/m   Intake/Output Summary (Last 24 hours) at 05/14/2022 0803 Last data filed at 05/14/2022 0800 Gross per 24 hour  Intake 1657.02 ml  Output 2950 ml  Net -1292.98 ml   Intake/Output: I/O last 3 completed shifts: In: 3501.5 [I.V.:1511.9; Blood:350; Other:30; NG/GT:1360; IV Piggyback:249.6] Out: 2950 [Other:2300; Stool:650]  Intake/Output this shift:  Total I/O In: 29 [I.V.:29] Out: -  Weight change:  Gen:on vent via trach, awake CVS: RRR Resp:Ventilated BS bilaterally Abd: +BS, soft, NT Ext:2+ edema of hands bilaterally legs wrapped Dialysis access:   Recent Labs  Lab 05/09/22 0836 05/09/22 1549 05/11/22 0445 05/11/22 1514 05/11/22 1516 05/12/22 0454 05/12/22 1544 05/13/22 0314 05/13/22 1739 05/14/22 0520  NA  --    < > 138 138 138 137 136 138 134* 139  K  --    < > 4.4 4.7 4.6 4.8 4.9 5.2* 4.1 4.4  CL  --    < > 105  --  106 107 107 109 99 103  CO2  --    < > 24  --  24 23 20* 20* 24 22  GLUCOSE  --    < > 142*  --  132* 154* 146* 118* 126* 98  BUN  --    < > 50*  --  49* 53* 76* 92* 59* 73*  CREATININE  --    < > 2.12*  --  2.18* 2.45* 3.39* 4.32* 3.25* 4.19*  ALBUMIN <1.5*   < > 1.6*  --  1.6* <1.5* <1.5* <1.5* 1.8* <1.5*  CALCIUM  --    < > 7.8*  --  7.7* 8.0* 7.7* 7.8* 7.6* 8.0*  PHOS  --    < > 4.5  --  4.9* 5.4* 6.7* 7.8* 5.6* 6.2*  AST 385*  --   --   --   --   --   --   --   --   --   ALT 324*  --   --   --   --   --   --   --   --   --    < > = values in this interval not displayed.   Liver Function  Tests: Recent Labs  Lab 05/09/22 0836 05/09/22 1549 05/13/22 0314 05/13/22 1739 05/14/22 0520  AST 385*  --   --   --   --   ALT 324*  --   --   --   --   ALKPHOS 216*  --   --   --   --   BILITOT 1.6*  --   --   --   --   PROT 5.2*  --   --   --   --   ALBUMIN <1.5*   < > <1.5* 1.8* <1.5*   < > = values in this interval not displayed.   No results for input(s): "LIPASE", "AMYLASE" in the last 168 hours. No results for  input(s): "AMMONIA" in the last 168 hours. CBC: Recent Labs  Lab 05/11/22 0445 05/11/22 1514 05/12/22 0454 05/12/22 1455 05/13/22 0314 05/13/22 0930 05/14/22 0520  WBC 21.0*  --  15.4*  --  12.9* 13.4* 8.8  HGB 7.0*   < > 6.2*   < > 6.8* 8.8* 7.0*  HCT 21.6*   < > 19.2*   < > 21.6* 26.4* 21.1*  MCV 83.7  --  84.6  --  85.4 85.4 85.4  PLT 173  --  177  --  189 197 201   < > = values in this interval not displayed.   Cardiac Enzymes: No results for input(s): "CKTOTAL", "CKMB", "CKMBINDEX", "TROPONINI" in the last 168 hours. CBG: Recent Labs  Lab 05/13/22 1133 05/13/22 1553 05/13/22 1936 05/13/22 2317 05/14/22 0335  GLUCAP 113* 135* 123* 131* 127*    Iron Studies:  Recent Labs    05/12/22 1009  IRON 20*  TIBC 245*  FERRITIN 453*   Studies/Results: No results found.  Chlorhexidine Gluconate Cloth  6 each Topical Daily   Chlorhexidine Gluconate Cloth  6 each Topical Q0600   clonazePAM  0.5 mg Per Tube BID   docusate  100 mg Per Tube BID   feeding supplement (PROSource TF20)  60 mL Per Tube BID   heparin injection (subcutaneous)  5,000 Units Subcutaneous Q8H   HYDROmorphone HCl  2 mg Per Tube Q4H   Followed by   [START ON 05/16/2022] HYDROmorphone HCl  1.5 mg Per Tube Q4H   Followed by   Derrill Memo ON 05/19/2022] HYDROmorphone HCl  1 mg Per Tube Q4H   Followed by   Derrill Memo ON 05/22/2022] HYDROmorphone HCl  0.5 mg Per Tube Q4H   insulin aspart  0-20 Units Subcutaneous Q4H   insulin aspart  4 Units Subcutaneous Q4H   multivitamin  1 tablet  Per Tube QHS   mouth rinse  15 mL Mouth Rinse Q2H   pantoprazole  40 mg Per Tube Daily   QUEtiapine  50 mg Per Tube BID   sodium chloride flush  10-40 mL Intracatheter Q12H   sodium chloride flush  3 mL Intravenous Q12H    BMET    Component Value Date/Time   NA 139 05/14/2022 0520   NA 139 07/02/2014 1923   K 4.4 05/14/2022 0520   K 3.9 07/02/2014 1923   CL 103 05/14/2022 0520   CL 105 07/02/2014 1923   CO2 22 05/14/2022 0520   CO2 26 07/02/2014 1923   GLUCOSE 98 05/14/2022 0520   GLUCOSE 95 07/02/2014 1923   BUN 73 (H) 05/14/2022 0520   BUN 7 07/02/2014 1923   CREATININE 4.19 (H) 05/14/2022 0520   CREATININE 0.98 07/02/2014 1923   CALCIUM 8.0 (L) 05/14/2022 0520   CALCIUM 8.5 07/02/2014 1923   GFRNONAA 12 (L) 05/14/2022 0520   GFRNONAA >60 07/02/2014 1923   GFRAA >60 04/22/2018 2215   GFRAA >60 07/02/2014 1923   CBC    Component Value Date/Time   WBC 8.8 05/14/2022 0520   RBC 2.47 (L) 05/14/2022 0520   HGB 7.0 (L) 05/14/2022 0520   HGB 9.4 (L) 07/02/2014 1923   HCT 21.1 (L) 05/14/2022 0520   HCT 30.3 (L) 07/02/2014 1923   PLT 201 05/14/2022 0520   PLT 449 (H) 07/02/2014 1923   MCV 85.4 05/14/2022 0520   MCV 71 (L) 07/02/2014 1923   MCH 28.3 05/14/2022 0520   MCHC 33.2 05/14/2022 0520   RDW 16.4 (H) 05/14/2022 0520  RDW 18.8 (H) 07/02/2014 1923   LYMPHSABS 2.0 05/07/2022 0428   MONOABS 2.4 (H) 05/07/2022 0428   EOSABS 0.0 05/07/2022 0428   BASOSABS 0.1 05/07/2022 0428   Assessment/Plan:   AKI - likely ATN in the setting of cardiogenic and septic shock. BL Cr ~0.8-1. Started on CRRT 05/01/22 after RIJ temp HD catheter placed.  CRRT stopped 05/11/22.  First IHD 10/16.  No evidence of renal recovery unfortunately based on labs and no urine output with bumex challenge.  Will keep HD on MWF schedule. Will consult IR for tunneled dialysis catheter especially since her temp line has been in since 10/4 Avoid nephrotoxic medications including NSAIDs and iodinated  intravenous contrast exposure unless the latter is absolutely indicated.   Preferred narcotic agents for pain control are hydromorphone, fentanyl, and methadone. Morphine should not be used.  Avoid Baclofen and avoid oral sodium phosphate and magnesium citrate based laxatives / bowel preps.  Continue strict Input and Output monitoring. Will monitor the patient closely with you and intervene or adjust therapy as indicated by changes in clinical status/labs  Cardiogenic/septic shock - weaned off pressors  Heart failure team following. Likely due to aspiration pneumonia. Acute hypoxic respiratory failure - was re-intubated 05/05/22.  Extubated again on 10/14 but failed BiPAP, s/p trach and back on Vent 05/11/22 per PCCM. Infectious enteritis - seen on CT scan.  WBC improved. Plan per PCCM. Anemia of critical illness - transfuse for Hgb <7. Requiring PRBCs. Per primary. Will start ESA Acute MI - normal coronaries by cath 10/4 AGMA - improved with CRRT.Managing with IHD Thrombocytopenia - improved S/p robotic incisional hernia repair with mesh and LOA - Surgery following.   Gean Quint, MD [facility]

## 2022-05-14 NOTE — Progress Notes (Signed)
NAME:  Patricia [name], MRN:  [id number], DOB:  Jul 27, 1971, LOS: 68 ADMISSION DATE:  04/23/2022, CONSULTATION DATE:  04/29/2022 REFERRING MD:  Felicie Morn, MD, CHIEF COMPLAINT:   Small bowel obstruction, aspiration event, sepsis, respiratory failure    History of Present Illness:  Patricia [name] with above hx and nuc study in 2015 neg for ischemia done for chest pain and echo with EF 60-65% presented to hospital for excisional hernia repair (robotic hernia repair 04/23/22) with mesh, ileus post op on TNA. Developed vomiting and hypotension after NG came out on 04/29/22 and PNA on CXR, was tachycardic and BP improved with IV fluids. DX with sepsis and ABX started. At one point prolonged Qtc. Pt admitted to ICU on 10/2.  Was placed on levophed. Early AM on 10/3 was intubated for worsening metabolic/lactic acidosis. She had AKI as well. Crt was 6.01, her normal 0.9. Today Cr is 4 on CRRT.  Pertinent  Medical History  DM2 Depression OSA Migraines  Asthma Obesity Schizophrenia HTN HLD  Significant Hospital Events: Including procedures, antibiotic start and stop dates in addition to other pertinent events   9/26 Robotic hernia repair complicated by abd adhesions.  10/2 CT + for developing SBO and bilat lower lobe opacities most likely aspiration PNA. Progressive decompensation requiring transfer to ICU 10/2 PCCM consulted for respiratory failure & shock 10\3 Intubated for resp failure. CVL and arterial line placed. 04/30/22: EF 30-35%, RV moderately reduced, mild to moderate MR, IVC collapsed throughout respiratory cycle 10\4 EKG changes/trop increased--> and taken to cath lab - No obstructive CAD, but in decompensated heart failure, Bedside echo with EF 15%, new WMA, moderate RV dysfunction, trivial pericardial effusion.underwent emergent L/RHC. Normal coronaries, RA mean 13, PA mean 29, LVEDP 16, PCWP 19, CO/CI 3.96/1.87   returned to CICU post-cath. Right heart cath placed right fem.  Right IJ HD cath placed.  10/5 advanced HF team following, shock supported on norepi/vasopressin and Dobutamine. On CRRT.  10/6 weaning Norepi. Starting to wean fent. Needing restraints.  10/8: Reintubated due to neuromuscular weakness.  Bedside echo demonstrating improved RV function, LV function remained depressed 10/9 CT abdomen pelvis.suggesting wall thickening throughout the proximal and mid small bowel consistent with possible infectious or inflammatory enteritis there is also bibasilar airspace disease which could reflect pneumonia.  Antibiotic coverage widened.  Zosyn changed to meropenem.  Tolerating trickle feeds still on TPN 10/10 stopped stress dose steroids.  Resumed subcutaneous heparin. 10/13 albumin for coox decrease. Weaning vent . Failed SBT. At goal TF. 10/14 extubated to bipap failed due to weakness, trached 10/15 trial off CRRT, lasix  Interim History / Subjective:   Tolerated trach collar yesterday for a least an hour. Isolated low grade fever.   Objective   Blood pressure (!) 100/57, pulse 96, temperature (!) 100.8 F (38.2 C), temperature source Oral, resp. rate (!) 25, height '5\' 4"'$  (1.626 m), weight 114.8 kg, last menstrual period 07/07/2014, SpO2 100 %. CVP:  [7 mmHg-31 mmHg] 11 mmHg  Vent Mode: PRVC FiO2 (%):  [35 %-40 %] 40 % Set Rate:  [20 bmp] 20 bmp Vt Set:  [460 mL] 460 mL PEEP:  [5 cmH20] 5 cmH20 Pressure Support:  [8 cmH20] 8 cmH20 Plateau Pressure:  [16 cmH20-18 cmH20] 16 cmH20   Intake/Output Summary (Last 24 hours) at 05/14/2022 0904 Last data filed at 05/14/2022 0800 Gross per 24 hour  Intake 1656.36 ml  Output 2950 ml  Net -1293.64 ml    Filed Weights   05/10/22  3893 05/11/22 0500 05/14/22 0415  Weight: 108.7 kg 108.6 kg 114.8 kg    Examination: General: chronically ill appearing woman lying in bed in NAD HEENT: sore on upper lip- not currently bleeding. [place]/AT  Neck: trach in place with small amount of bleeding inferiorly Pulmonary:  Breathing comfortably on . Minimal bloody secretions.  Chest clear. Cardiac: S1S2, RRR Abdomen: obese, soft, NT Extremities: pedal edema, worse hand edema today Neuro: RASS 0, moving all extremities    Ancillary tests personally reviewed:  Creatinine back up to 4.19  HB: 7.0 Albumin: <1.5  Assessment & Plan:   Acute metabolic encephalopathy superimposed on hx bipolar disorder, schizophrenia   ICU delirium- improved -Start enteral sedation and analgesic taper with goal to come off sedative infusions completely  Acute biventricular HFrEF (Etiology sepsis, stress induced CM. Has clean coronaries) Cardiogenic shock resolved, EF improved.  -Stop CVP and ScvO2 monitoring.   Acute hypoxic respiratory failure Aspiration PNA Pulm edema   RLL VAP  -meropenem x 10 days -LTVV -con't weaning efforts - start progressive trach collar trials.  -Routine trach care; can remove sutures on 10/21. SLP consulted.  AKI with oliguria; no baseline CKD - failed attempt to come off HD - Permcath this week.   S/p hernia repair S/p LOA Ileus  inflammatory enteritis  -con't TF at goal -meropenem x 10 days -appreciate surgery's management  Anemia, acute on chronic  -transfuse 1 unit this morning for Hb <7  -monitor for signs of bleeding - none so far.   DM2 with hyperglycemia  -SSI PRN -drop TF coverage to 4 units q4h since TPN stopping -goal BG 140-180  Severe protein calorie malnutrition  -con't TF at goal  Pressure injury- lip -having trach in place helps relieve pressure  -wound care  Deconditioning -PT, OT, SLP  Best Practice (right click and "Reselect all SmartList Selections" daily)   Diet/type: tubefeeds DVT prophylaxis: prophylactic heparin  GI prophylaxis: PPI Lines: Central line and Dialysis Catheter Foley:  Yes, and it is still needed Code Status:  full code  CRITICAL CARE Performed by: Kipp Brood   Total critical care time: 40 minutes  Critical care time  was exclusive of separately billable procedures and treating other patients.  Critical care was necessary to treat or prevent imminent or life-threatening deterioration.  Critical care was time spent personally by me on the following activities: development of treatment plan with patient and/or surrogate as well as nursing, discussions with consultants, evaluation of patient's response to treatment, examination of patient, obtaining history from patient or surrogate, ordering and performing treatments and interventions, ordering and review of laboratory studies, ordering and review of radiographic studies, pulse oximetry, re-evaluation of patient's condition and participation in multidisciplinary rounds.  Kipp Brood, MD [facility] ICU Physician [place]  Pager: [phone] Mobile: [phone] After hours: [phone].

## 2022-05-14 NOTE — Progress Notes (Signed)
Speech Language Pathology Treatment: Cognitive-Linquistic  Patient Details Name: Patricia [name] MRN: [id number] DOB: 1971/06/24 Today's Date: 05/14/2022 Time: 1545-1600 SLP Time Calculation (min) (ACUTE ONLY): 15 min  Assessment / Plan / Recommendation Clinical Impression  RT present and changed pt back to TC so that she could wear her PMV for a few minutes.  Cuff deflated and valve placed. Pt produced more spontaneous conversation today; continues to have low volume speech made more difficult to understand because of the bleeding wounds on lips. Sp02 remained >95%; RR intermittently reached high 30s. Required cues to speak slower and louder. Verbalizations were primarily about her pain in her bottom, rating pain level a 10. RN aware; provided pain meds during session; we repositioned her to assist with comfort.  Patricia [name] answered some basic questions about home town, having five children and multiple grandchildren.  RT put her back on the vent to rest.   Recommend staff place PMV when pt is on trach collar and when she can have full supervision, even if she only wears valve for a few minutes.   SLP will follow.    HPI HPI: Patricia [name] is a 51 y.o. female who presented to hospital for excisional hernia repair (robotic hernia repair 04/23/22) with mesh, ileus post op on TNA. Developed vomiting and hypotension after NG came out on 04/29/22 and PNA on CXR, was tachycardic and BP improved with IV fluids. DX with sepsis and ABX started. Pt admitted to ICU on 10/2.  Was placed on levophed. Early AM on 10/3 was intubated for worsening metabolic/lactic acidosis. 10/4 cardiac cath with EF 15%; She had AKI as well. Started CRRT. Extubation 10/14 failed; underwent tracheostomy. TC trials started 10/16.      SLP Plan  Continue with current plan of care      Recommendations for follow up therapy are one component of a multi-disciplinary discharge planning process, led by the attending physician.   Recommendations may be updated based on patient status, additional functional criteria and insurance authorization.    Recommendations         Patient may use Passy-Muir Speech Valve: Intermittently with supervision PMSV Supervision: Full         Oral Care Recommendations: Oral care QID Follow Up Recommendations: Other (comment) (tba) SLP Visit Diagnosis: Aphonia (R49.1) Plan: Continue with current plan of care         Denim Start L. Tivis Ringer, MA CCC/SLP Clinical Specialist - Acute Care SLP Acute Rehabilitation Services Office number [phone]   [name]  05/14/2022, 4:04 PM

## 2022-05-14 NOTE — Progress Notes (Signed)
1900 patient alert pointing to buttocks and nods that she's having burning cream applied and patient repositioned patient educated on flexi-seal and the amounts of output she was having.Patient able to hold side rails and pull self over.  2200 medications given as ordered patient refusing mouth care allowed after education given 2200 lights and TV turned off per patient request after medications and trach care.

## 2022-05-14 NOTE — Progress Notes (Signed)
Physical Therapy Treatment Patient Details Name: Patricia [name] MRN: [id number] DOB: 11-Dec-1970 Today's Date: 05/14/2022   History of Present Illness 51 yo female admitted 04/23/22 for excisional hernia repair with mesh, ileus post op on TNA. Developed vomiting and hypotension after NGT removed 04/29/22 and PNA on CXR, was tachycardic. DX with sepsis and ABX started. Pt admitted to ICU on 10/2 on levophed. 10/3 intubated for worsening metabolic/lactic acidosis. 10/4 cardiac cath with EF 15%; She had AKI as well. CRRT 10/5-10/14. Failed extubation 10/8 and 10/14 with trach 10/14. PMhx: depression, OSA, asthma, schizophrenia, DM, insomnia    PT Comments    Pt alert, following commands and able to sit in full chair position in bed 13 min with grossly 5 min with trunk off surface with and without UE support. Pt following commands for movement of all extremities with maintained profound weakness from medical complexities limiting function.  Will continue to follow to maximize mobility and strength with pt in chair position end of session.   PRVC 40% FIO2, peep 8 with SPO2 100% HR 112 BP 113/69   Recommendations for follow up therapy are one component of a multi-disciplinary discharge planning process, led by the attending physician.  Recommendations may be updated based on patient status, additional functional criteria and insurance authorization.  Follow Up Recommendations  PT at Long-term acute care hospital     Assistance Recommended at Discharge Frequent or constant Supervision/Assistance  Patient can return home with the following Two people to help with walking and/or transfers;Two people to help with bathing/dressing/bathroom;Assistance with cooking/housework;Assistance with feeding;Direct supervision/assist for medications management;Direct supervision/assist for financial management;Assist for transportation;Help with stairs or ramp for entrance   Equipment Recommendations  Hospital  bed;Wheelchair (measurements PT);Wheelchair cushion (measurements PT)    Recommendations for Other Services       Precautions / Restrictions Precautions Precautions: Fall;Other (comment) Precaution Comments: new trach, vent, cortrak     Mobility  Bed Mobility Overal bed mobility: Needs Assistance Bed Mobility: Supine to Sit, Sit to Supine           General bed mobility comments: foot egress positioning to achieve supine<>sit. Max +2 to slide toward [facility]    Transfers Overall transfer level: Needs assistance   Transfers: Sit to/from Stand Sit to Stand: +2 physical assistance, Max assist           General transfer comment: mod assist for reciprocal scooting to EOB. Attempted sit to stand with max +2 with pt able to initiate anterior translation and rise but unable to clear sacrum from surface    Ambulation/Gait                   Stairs             Wheelchair Mobility    Modified Rankin (Stroke Patients Only)       Balance Overall balance assessment: Needs assistance   Sitting balance-Leahy Scale: Poor Sitting balance - Comments: bil UE support on rails with pt able to achieve trunk off surface   Standing balance support: Bilateral upper extremity supported Standing balance-Leahy Scale: Zero                              Cognition Arousal/Alertness: Awake/alert Behavior During Therapy: Flat affect Overall Cognitive Status: Difficult to assess  General Comments: pt nodding and using minimal hand gestures to communicate, following single step commands appropriately        Exercises General Exercises - Upper Extremity Shoulder Flexion: AAROM, Both, 10 reps, Seated General Exercises - Lower Extremity Short Arc Quad: AROM, Both, 10 reps, Seated Hip Flexion/Marching: AAROM, Both, 10 reps, Seated    General Comments        Pertinent Vitals/Pain Pain Assessment Faces Pain Scale:  Hurts a little bit Pain Location: abdomen Pain Descriptors / Indicators: Aching Pain Intervention(s): Limited activity within patient's tolerance, Monitored during session, Patient requesting pain meds-RN notified    Home Living                          Prior Function            PT Goals (current goals can now be found in the care plan section) Progress towards PT goals: Progressing toward goals    Frequency    Min 3X/week      PT Plan Discharge plan needs to be updated    Co-evaluation              AM-PAC PT "6 Clicks" Mobility   Outcome Measure  Help needed turning from your back to your side while in a flat bed without using bedrails?: Total Help needed moving from lying on your back to sitting on the side of a flat bed without using bedrails?: Total Help needed moving to and from a bed to a chair (including a wheelchair)?: Total Help needed standing up from a chair using your arms (e.g., wheelchair or bedside chair)?: Total Help needed to walk in hospital room?: Total Help needed climbing 3-5 steps with a railing? : Total 6 Click Score: 6    End of Session   Activity Tolerance: Patient tolerated treatment well Patient left: in bed;with call bell/phone within reach Nurse Communication: Mobility status;Need for lift equipment PT Visit Diagnosis: Muscle weakness (generalized) (M62.81);Difficulty in walking, not elsewhere classified (R26.2);Other abnormalities of gait and mobility (R26.89)     Time: 7096-2836 PT Time Calculation (min) (ACUTE ONLY): 27 min  Charges:  $Therapeutic Exercise: 8-22 mins $Therapeutic Activity: 8-22 mins                     Bayard Males, PT Acute Rehabilitation Services Office: [phone]    Teofilo Lupinacci B Eliyanna Ault 05/14/2022, 1:20 PM

## 2022-05-15 ENCOUNTER — Inpatient Hospital Stay (HOSPITAL_COMMUNITY): Payer: Medicaid Other

## 2022-05-15 DIAGNOSIS — A419 Sepsis, unspecified organism: Secondary | ICD-10-CM | POA: Diagnosis not present

## 2022-05-15 HISTORY — PX: IR US GUIDE VASC ACCESS RIGHT: IMG2390

## 2022-05-15 HISTORY — PX: IR FLUORO GUIDE CV LINE RIGHT: IMG2283

## 2022-05-15 LAB — FERRITIN: Ferritin: 167 ng/mL (ref 11–307)

## 2022-05-15 LAB — RENAL FUNCTION PANEL
Albumin: <1.5 g/dL — ABNORMAL LOW (ref 3.5–5.0)
Albumin: <1.5 g/dL — ABNORMAL LOW (ref 3.5–5.0)
Anion gap: 17 — ABNORMAL HIGH (ref 5–15)
Anion gap: 14 (ref 5–15)
BUN: 112 mg/dL — ABNORMAL HIGH (ref 6–20)
BUN: 105 mg/dL — ABNORMAL HIGH (ref 6–20)
CO2: 21 mmol/L — ABNORMAL LOW (ref 22–32)
CO2: 18 mmol/L — ABNORMAL LOW (ref 22–32)
Calcium: 8.4 mg/dL — ABNORMAL LOW (ref 8.9–10.3)
Calcium: 7 mg/dL — ABNORMAL LOW (ref 8.9–10.3)
Chloride: 98 mmol/L (ref 98–111)
Chloride: 103 mmol/L (ref 98–111)
Creatinine, Ser: 5.96 mg/dL — ABNORMAL HIGH (ref 0.44–1.00)
Creatinine, Ser: 5.61 mg/dL — ABNORMAL HIGH (ref 0.44–1.00)
GFR, Estimated: 8 mL/min — ABNORMAL LOW (ref >60)
GFR, Estimated: 9 mL/min — ABNORMAL LOW (ref >60)
Glucose, Bld: 183 mg/dL — ABNORMAL HIGH (ref 70–99)
Glucose, Bld: 220 mg/dL — ABNORMAL HIGH (ref 70–99)
Phosphorus: 7.8 mg/dL — ABNORMAL HIGH (ref 2.5–4.6)
Phosphorus: 7 mg/dL — ABNORMAL HIGH (ref 2.5–4.6)
Potassium: 5 mmol/L (ref 3.5–5.1)
Potassium: 4.6 mmol/L (ref 3.5–5.1)
Sodium: 136 mmol/L (ref 135–145)
Sodium: 135 mmol/L (ref 135–145)

## 2022-05-15 LAB — FOLATE: Folate: 6.1 ng/mL (ref >5.9)

## 2022-05-15 LAB — CBC
HCT: 20.9 % — ABNORMAL LOW (ref 36.0–46.0)
Hemoglobin: 6.9 g/dL — CL (ref 12.0–15.0)
MCH: 28.3 pg (ref 26.0–34.0)
MCHC: 33 g/dL (ref 30.0–36.0)
MCV: 85.7 fL (ref 80.0–100.0)
Platelets: 241 10*3/uL (ref 150–400)
RBC: 2.44 MIL/uL — ABNORMAL LOW (ref 3.87–5.11)
RDW: 16 % — ABNORMAL HIGH (ref 11.5–15.5)
WBC: 6.7 10*3/uL (ref 4.0–10.5)
nRBC: 0 % (ref 0.0–0.2)

## 2022-05-15 LAB — GLUCOSE, CAPILLARY
Glucose-Capillary: 99 mg/dL (ref 70–99)
Glucose-Capillary: 119 mg/dL — ABNORMAL HIGH (ref 70–99)
Glucose-Capillary: 165 mg/dL — ABNORMAL HIGH (ref 70–99)
Glucose-Capillary: 52 mg/dL — ABNORMAL LOW (ref 70–99)
Glucose-Capillary: 130 mg/dL — ABNORMAL HIGH (ref 70–99)
Glucose-Capillary: 158 mg/dL — ABNORMAL HIGH (ref 70–99)
Glucose-Capillary: 137 mg/dL — ABNORMAL HIGH (ref 70–99)

## 2022-05-15 LAB — CULTURE, RESPIRATORY W GRAM STAIN: Culture: NO GROWTH

## 2022-05-15 LAB — PREPARE RBC (CROSSMATCH)

## 2022-05-15 LAB — C-REACTIVE PROTEIN: CRP: 1.9 mg/dL — ABNORMAL HIGH (ref <1.0)

## 2022-05-15 LAB — MAGNESIUM: Magnesium: 2.3 mg/dL (ref 1.7–2.4)

## 2022-05-15 LAB — VITAMIN B12: Vitamin B-12: 1079 pg/mL — ABNORMAL HIGH (ref 180–914)

## 2022-05-15 MED ORDER — GELATIN ABSORBABLE 12-7 MM EX MISC
CUTANEOUS | Status: AC
  Filled 2022-05-15: qty 1

## 2022-05-15 MED ORDER — JUVEN PO PACK
1.0000 | PACK | Freq: Two times a day (BID) | ORAL | Status: DC
  Administered 2022-05-16 – 2022-05-18 (×6): 1 via ORAL
  Filled 2022-05-15 (×6): qty 1

## 2022-05-15 MED ORDER — DEXTROSE 50 % IV SOLN
25.0000 g | Freq: Once | INTRAVENOUS | Status: AC
  Administered 2022-05-15: 25 g via INTRAVENOUS

## 2022-05-15 MED ORDER — SODIUM CHLORIDE 0.9% IV SOLUTION: Freq: Once | INTRAVENOUS | Status: AC

## 2022-05-15 MED ORDER — CEFAZOLIN SODIUM-DEXTROSE 2-4 GM/100ML-% IV SOLN: 2.0000 g | Freq: Once | INTRAVENOUS | Status: AC

## 2022-05-15 MED ORDER — FENTANYL CITRATE (PF) 100 MCG/2ML IJ SOLN
INTRAMUSCULAR | Status: AC
  Filled 2022-05-15: qty 2

## 2022-05-15 MED ORDER — CEFAZOLIN (ANCEF) 1 G IV SOLR
1.0000 g | INTRAVENOUS | Status: DC
  Filled 2022-05-15: qty 1

## 2022-05-15 MED ORDER — CEFAZOLIN SODIUM-DEXTROSE 2-4 GM/100ML-% IV SOLN
INTRAVENOUS | Status: AC
  Administered 2022-05-15: 2 g via INTRAVENOUS
  Filled 2022-05-15: qty 100

## 2022-05-15 MED ORDER — LIDOCAINE-EPINEPHRINE 1 %-1:100000 IJ SOLN
INTRAMUSCULAR | Status: AC
  Filled 2022-05-15: qty 1

## 2022-05-15 MED ORDER — CEFAZOLIN SODIUM-DEXTROSE 2-4 GM/100ML-% IV SOLN: 2.0000 g | INTRAVENOUS | Status: AC

## 2022-05-15 MED ORDER — NUTRISOURCE FIBER PO PACK
1.0000 | PACK | Freq: Two times a day (BID) | ORAL | Status: DC
  Administered 2022-05-15 – 2022-06-04 (×36): 1
  Filled 2022-05-15 (×44): qty 1

## 2022-05-15 MED ORDER — MIDAZOLAM HCL 2 MG/2ML IJ SOLN
INTRAMUSCULAR | Status: AC
  Filled 2022-05-15: qty 2

## 2022-05-15 MED ORDER — DEXTROSE 50 % IV SOLN
INTRAVENOUS | Status: AC
  Filled 2022-05-15: qty 50

## 2022-05-15 MED ORDER — HEPARIN SODIUM (PORCINE) 1000 UNIT/ML IJ SOLN
INTRAMUSCULAR | Status: AC
  Filled 2022-05-15: qty 10

## 2022-05-15 MED ORDER — FENTANYL CITRATE (PF) 100 MCG/2ML IJ SOLN
INTRAMUSCULAR | Status: AC | PRN
  Administered 2022-05-15: 50 ug via INTRAVENOUS

## 2022-05-15 MED ORDER — DOCUSATE SODIUM 50 MG/5ML PO LIQD
100.0000 mg | Freq: Two times a day (BID) | ORAL | Status: DC | PRN
  Administered 2022-05-20 – 2022-06-03 (×3): 100 mg
  Filled 2022-05-15 (×3): qty 10

## 2022-05-15 NOTE — Progress Notes (Signed)
eLink Physician-Brief Progress Note Patient Name: Patricia [name] DOB: Mar 03, 1971 MRN: [id number]   Date of Service  05/15/2022  HPI/Events of Note  Anemia - Hgb = 6.9.   eICU Interventions  Will transfuse 1 unit PRBC.     Intervention Category Major Interventions: Other:  Lysle Dingwall 05/15/2022, 5:52 AM

## 2022-05-15 NOTE — Progress Notes (Signed)
14 Days Post-Op   Subjective/Chief Complaint: Not in room on rounds today - in IR for tunneled dialysis catheter 1u PRBC today  Objective: Vital signs in last 24 hours: Temp:  [98.7 F (37.1 C)-100.2 F (37.9 C)] 99.3 F (37.4 C) (10/18 0945) Pulse Rate:  [99-116] 116 (10/18 1028) Resp:  [22-38] 38 (10/18 1028) BP: (97-128)/(55-78) 116/72 (10/18 0945) SpO2:  [100 %] 100 % (10/18 1028) FiO2 (%):  [35 %-40 %] 35 % (10/18 1028) Weight:  [115.5 kg] 115.5 kg (10/18 0500) Last BM Date : 05/15/22  Intake/Output from previous day: 10/17 0701 - 10/18 0700 In: 1452.7 [I.V.:240.1; NG/GT:990; IV Piggyback:222.7] Out: 50 [Stool:50] Intake/Output this shift: Total I/O In: 379.5 [I.V.:19.5; Blood:360] Out: -   Exam: Sitting up in bed Trach, on vent Follows commands Abdomen soft, obese, mild TTP. Incisions c/d/i  Lab Results:  Recent Labs    05/14/22 0520 05/15/22 0500  WBC 8.8 6.7  HGB 7.0* 6.9*  HCT 21.1* 20.9*  PLT 201 241    BMET Recent Labs    05/14/22 0520 05/15/22 0500  NA 139 136  K 4.4 5.0  CL 103 98  CO2 22 21*  GLUCOSE 98 183*  BUN 73* 112*  CREATININE 4.19* 5.96*  CALCIUM 8.0* 8.4*    PT/INR No results for input(s): "LABPROT", "INR" in the last 72 hours. ABG No results for input(s): "PHART", "HCO3" in the last 72 hours.  Invalid input(s): "PCO2", "PO2"   Studies/Results: No results found.  Anti-infectives: Anti-infectives (From admission, onward)    Start     Dose/Rate Route Frequency Ordered Stop   05/15/22 1103  ceFAZolin (ANCEF) 2-4 GM/100ML-% IVPB       Note to Pharmacy: Rudene Re L: cabinet override      05/15/22 1103 05/15/22 2314   05/15/22 0000  ceFAZolin (ANCEF) IVPB 2g/100 mL premix        2 g 200 mL/hr over 30 Minutes Intravenous To Radiology 05/14/22 1308 05/15/22 0005   05/13/22 2134  meropenem (MERREM) 500 mg in sodium chloride 0.9 % 100 mL IVPB        500 mg 200 mL/hr over 30 Minutes Intravenous Every 24 hours 05/13/22  0815 05/17/22 2144   05/13/22 1000  meropenem (MERREM) 500 mg in sodium chloride 0.9 % 100 mL IVPB  Status:  Discontinued        500 mg 200 mL/hr over 30 Minutes Intravenous Every 12 hours 05/12/22 0940 05/12/22 0946   05/12/22 2200  meropenem (MERREM) 500 mg in sodium chloride 0.9 % 100 mL IVPB  Status:  Discontinued        500 mg 200 mL/hr over 30 Minutes Intravenous Every 12 hours 05/12/22 0946 05/13/22 0815   05/12/22 2000  meropenem (MERREM) 1 g in sodium chloride 0.9 % 100 mL IVPB  Status:  Discontinued        1 g 200 mL/hr over 30 Minutes Intravenous  Once 05/12/22 0940 05/12/22 0946   05/06/22 1530  meropenem (MERREM) 1 g in sodium chloride 0.9 % 100 mL IVPB  Status:  Discontinued        1 g 200 mL/hr over 30 Minutes Intravenous Every 8 hours 05/06/22 1438 05/12/22 0940   05/04/22 1000  vancomycin (VANCOCIN) IVPB 1000 mg/200 mL premix  Status:  Discontinued        1,000 mg 200 mL/hr over 60 Minutes Intravenous Every 24 hours 05/03/22 0802 05/08/22 0913   05/03/22 0845  vancomycin (VANCOREADY) IVPB 2000 mg/400 mL  2,000 mg 200 mL/hr over 120 Minutes Intravenous  Once 05/03/22 0751 05/03/22 1114   05/01/22 1800  piperacillin-tazobactam (ZOSYN) IVPB 3.375 g  Status:  Discontinued        3.375 g 12.5 mL/hr over 240 Minutes Intravenous Every 6 hours 05/01/22 1426 05/01/22 1427   05/01/22 1515  piperacillin-tazobactam (ZOSYN) IVPB 3.375 g  Status:  Discontinued        3.375 g 100 mL/hr over 30 Minutes Intravenous Every 6 hours 05/01/22 1428 05/06/22 1438   04/30/22 1400  piperacillin-tazobactam (ZOSYN) IVPB 2.25 g  Status:  Discontinued        2.25 g 100 mL/hr over 30 Minutes Intravenous Every 8 hours 04/30/22 1325 05/01/22 1426   04/29/22 1530  piperacillin-tazobactam (ZOSYN) IVPB 3.375 g  Status:  Discontinued        3.375 g 12.5 mL/hr over 240 Minutes Intravenous Every 8 hours 04/29/22 1433 04/30/22 1325   04/29/22 1145  metroNIDAZOLE (FLAGYL) IVPB 500 mg  Status:   Discontinued        500 mg 100 mL/hr over 60 Minutes Intravenous Every 12 hours 04/29/22 1047 04/29/22 1433   04/29/22 1145  vancomycin (VANCOCIN) IVPB 1000 mg/200 mL premix  Status:  Discontinued        1,000 mg 200 mL/hr over 60 Minutes Intravenous  Once 04/29/22 1047 04/29/22 1055   04/29/22 1145  vancomycin (VANCOREADY) IVPB 1500 mg/300 mL  Status:  Discontinued        1,500 mg 150 mL/hr over 120 Minutes Intravenous Every 24 hours 04/29/22 1055 04/29/22 1357   04/29/22 1045  ceFEPIme (MAXIPIME) 2 g in sodium chloride 0.9 % 100 mL IVPB  Status:  Discontinued        2 g 200 mL/hr over 30 Minutes Intravenous Every 8 hours 04/29/22 0954 04/29/22 1433   04/23/22 0730  ceFAZolin (ANCEF) IVPB 2g/100 mL premix        2 g 200 mL/hr over 30 Minutes Intravenous On call to O.R. 04/23/22 0726 04/23/22 1950       Assessment/Plan: Ms. Scharrer is s/p robotic incisional hernia repair on 04/23/22 -Postop ileus complicated by aspiration: Ileus resolved, having bowel function.  Tolerating tube feeds at goal Abdomen fairly benign on exam. Loose stools - hold laxatives   Cardiogenic/septic shock with cardiomyopathy after aspiration event - Recovering slowly, will need LTACH  Anemia - 1u pRBC today   AKI -HD. Tunneled catheter, no evidence of renal recovery   Resp failure -Trach for prolonged ventilator wean     Felicie Morn, [facility] 05/15/2022, 11:18 AM Please see Amion for pager number during day hours 7:00am-4:30pm

## 2022-05-15 NOTE — Progress Notes (Signed)
NAME:  Patricia [name], MRN:  [id number], DOB:  07/10/1971, LOS: 21 ADMISSION DATE:  04/23/2022, CONSULTATION DATE:  04/29/2022 REFERRING MD:  Felicie Morn, MD, CHIEF COMPLAINT:   Small bowel obstruction, aspiration event, sepsis, respiratory failure    History of Present Illness:  [name] with above hx and nuc study in 2015 neg for ischemia done for chest pain and echo with EF 60-65% presented to hospital for excisional hernia repair (robotic hernia repair 04/23/22) with mesh, ileus post op on TNA. Developed vomiting and hypotension after NG came out on 04/29/22 and PNA on CXR, was tachycardic and BP improved with IV fluids. DX with sepsis and ABX started. At one point prolonged Qtc. Pt admitted to ICU on 10/2.  Was placed on levophed. Early AM on 10/3 was intubated for worsening metabolic/lactic acidosis. She had AKI as well. Crt was 6.01, her normal 0.9. Today Cr is 4 on CRRT.  Pertinent  Medical History  DM2 Depression OSA Migraines  Asthma Obesity Schizophrenia HTN HLD  Significant Hospital Events: Including procedures, antibiotic start and stop dates in addition to other pertinent events   9/26 Robotic hernia repair complicated by abd adhesions.  10/2 CT + for developing SBO and bilat lower lobe opacities most likely aspiration PNA. Progressive decompensation requiring transfer to ICU 10/2 PCCM consulted for respiratory failure & shock 10\3 Intubated for resp failure. CVL and arterial line placed. 04/30/22: EF 30-35%, RV moderately reduced, mild to moderate MR, IVC collapsed throughout respiratory cycle 10\4 EKG changes/trop increased--> and taken to cath lab - No obstructive CAD, but in decompensated heart failure, Bedside echo with EF 15%, new WMA, moderate RV dysfunction, trivial pericardial effusion.underwent emergent L/RHC. Normal coronaries, RA mean 13, PA mean 29, LVEDP 16, PCWP 19, CO/CI 3.96/1.87   returned to CICU post-cath. Right heart cath placed right fem.  Right IJ HD cath placed.  10/5 advanced HF team following, shock supported on norepi/vasopressin and Dobutamine. On CRRT.  10/6 weaning Norepi. Starting to wean fent. Needing restraints.  10/8: Reintubated due to neuromuscular weakness.  Bedside echo demonstrating improved RV function, LV function remained depressed 10/9 CT abdomen pelvis.suggesting wall thickening throughout the proximal and mid small bowel consistent with possible infectious or inflammatory enteritis there is also bibasilar airspace disease which could reflect pneumonia.  Antibiotic coverage widened.  Zosyn changed to meropenem.  Tolerating trickle feeds still on TPN 10/10 stopped stress dose steroids.  Resumed subcutaneous heparin. 10/13 albumin for coox decrease. Weaning vent . Failed SBT. At goal TF. 10/14 extubated to bipap failed due to weakness, trached 10/15 trial off CRRT, lasix  Interim History / Subjective:   Tolerated several hours of trach collar yesterday.  Denies pain.  Uneventful PermCath placement today.  Objective   Blood pressure 131/88, pulse (!) 106, temperature 99.3 F (37.4 C), temperature source Oral, resp. rate (!) 27, height '5\' 4"'$  (1.626 m), weight 115.5 kg, last menstrual period 07/07/2014, SpO2 100 %. CVP:  [5 mmHg-21 mmHg] 17 mmHg  Vent Mode: PRVC FiO2 (%):  [35 %-100 %] 100 % Set Rate:  [20 bmp] 20 bmp Vt Set:  [460 mL] 460 mL PEEP:  [5 cmH20] 5 cmH20 Plateau Pressure:  [13 cmH20-16 cmH20] 13 cmH20   Intake/Output Summary (Last 24 hours) at 05/15/2022 1325 Last data filed at 05/15/2022 0945 Gross per 24 hour  Intake 1479.9 ml  Output 50 ml  Net 1429.9 ml    Filed Weights   05/11/22 0500 05/14/22 0415 05/15/22 0500  Weight:  108.6 kg 114.8 kg 115.5 kg    Examination: General: chronically ill appearing woman lying in bed in NAD HEENT: sore on upper lip- not currently bleeding. [name]  Neck: trach in place with small amount of bleeding inferiorly Pulmonary: Breathing comfortably on  . Minimal bloody secretions.  Chest clear. Cardiac: S1S2, RRR Abdomen: obese, soft, NT Extremities: pedal edema, worse hand edema today Neuro: RASS 0, moving all extremities    Ancillary tests personally reviewed:  Creatinine back up to 5.96 HB: 6.9 Albumin: <1.5  Assessment & Plan:   Acute metabolic encephalopathy superimposed on hx bipolar disorder, schizophrenia   ICU delirium- improved now off all IV infusions. -Continue enteral sedatives at current dose for 48 hours before beginning taper. -On scheduled hydromorphone taper.  Acute biventricular HFrEF (Etiology sepsis, stress induced CM. Has clean coronaries) Cardiogenic shock resolved, EF improved.  -Monitor clinically  Acute hypoxic respiratory failure Aspiration PNA Pulm edema   RLL VAP  -meropenem x 10 days -con't weaning efforts - start progressive trach collar trials: 2 hours of twice daily increasing interval by 30 to 60 minutes every 48 hours. -Routine trach care; can remove sutures on 10/21. SLP consulted.  AKI with oliguria; no baseline CKD -Thrice a week hemodialysis per nephrology.  S/p hernia repair S/p LOA Ileus  inflammatory enteritis  -con't TF at goal -meropenem x 10 days -appreciate surgery's management  Anemia, acute on chronic  -transfuse 1 unit this morning for Hb <7  -monitor for signs of bleeding - none so far.   DM2 with hyperglycemia  -SSI PRN -drop TF coverage to 4 units q4h since TPN stopping -goal BG 140-180  Severe protein calorie malnutrition  -con't TF at goal  Pressure injury- lip -having trach in place helps relieve pressure  -wound care  Deconditioning -PT, OT, SLP  Best Practice (right click and "Reselect all SmartList Selections" daily)   Diet/type: tubefeeds DVT prophylaxis: prophylactic heparin  GI prophylaxis: PPI Lines: Central line and Dialysis Catheter Foley:  Yes, and it is still needed Code Status:  full code   Kipp Brood, MD [facility] ICU  Physician [place]  Pager: [phone] Mobile: [phone] After hours: [phone].

## 2022-05-15 NOTE — Procedures (Signed)
Pre-procedure Diagnosis: ESRD Post-procedure Diagnosis: Same  Successful placement of tunneled HD catheter with tips terminating within the superior aspect of the right atrium.    Complications: None Immediate EBL: Trace  The catheter is ready for immediate use.   Jay Larrissa Stivers, MD Pager #: [phone]   

## 2022-05-15 NOTE — Progress Notes (Signed)
   05/15/22 1800  Vitals  Temp 99.2 F (37.3 C)  Pulse Rate (!) 112  Resp (!) 22  BP 122/88  SpO2 99 %  Oxygen Therapy  Patient Activity (if Appropriate) In bed  Pulse Oximetry Type Continuous  Oximetry Probe Site Changed No  Post Treatment  Dialyzer Clearance Clear  Duration of HD Treatment -hour(s) 3.5 hour(s)  Hemodialysis Intake (mL) 0 mL  Liters Processed 84  Fluid Removed 3000 mL  Tolerated HD Treatment Yes   Received patient in bed  Alert and oriented.  Informed consent signed and in chart.   Treatment initiated: 1415 Treatment completed: 7673  Patient tolerated well.   Alert, without acute distress.  Hand-off given to patient's nurse.   Access used: [email or web address] Access issues: none    [facility]aminy T [facility]

## 2022-05-15 NOTE — Inpatient Diabetes Management (Signed)
Inpatient Diabetes Program Recommendations  AACE/ADA: New Consensus Statement on Inpatient Glycemic Control (2015)  Target Ranges:  Prepandial:   less than 140 mg/dL      Peak postprandial:   less than 180 mg/dL (1-2 hours)      Critically ill patients:  140 - 180 mg/dL   Lab Results  Component Value Date   GLUCAP 52 (L) 05/15/2022   HGBA1C 6.1 (H) 04/17/2022    Latest Reference Range & Units 05/14/22 08:08 05/14/22 11:02 05/14/22 16:08 05/14/22 19:48 05/14/22 23:32 05/15/22 03:19 05/15/22 07:45 05/15/22 13:32  Glucose-Capillary 70 - 99 mg/dL 105 (H) 120 (H) 109 (H) 119 (H) 122 (H) 99 119 (H) 52 (L)   Inpatient Diabetes Program Recommendations:   Patient had hypoglycemia-was in procedure today. May consider decrease in Novolog correction to 0-9 units q 4 hrs.  Thank you, Nani Gasser. Bethenny Losee, RN, MSN, CDE  Diabetes Coordinator Inpatient Glycemic Control Team Team Pager [phone] (8am-5pm) 05/15/2022 1:55 PM

## 2022-05-15 NOTE — Progress Notes (Signed)
Patient ID: Patricia [name], female   DOB: 10/28/1970, 51 y.o.   MRN: [id number] S: hgb 6.9 this am, to receive 1u prbc O:BP 109/61   Pulse (!) 110   Temp 98.7 F (37.1 C) (Axillary)   Resp (!) 27   Ht '5\' 4"'$  (1.626 m)   Wt 115.5 kg   LMP 07/07/2014 (Approximate)   SpO2 100%   BMI 43.71 kg/m   Intake/Output Summary (Last 24 hours) at 05/15/2022 0804 Last data filed at 05/15/2022 0645 Gross per 24 hour  Intake 1373.77 ml  Output 50 ml  Net 1323.77 ml   Intake/Output: I/O last 3 completed shifts: In: 2552.5 [I.V.:540.2; NG/GT:1690; IV Piggyback:322.3] Out: 700 [Stool:700]  Intake/Output this shift:  No intake/output data recorded. Weight change: 0.7 kg Gen:on vent via trach, awake CVS: RRR Resp:Ventilated BS bilaterally Abd: +BS, soft, NT Ext: edema of hands bilaterally legs wrapped Neuro: awake  Recent Labs  Lab 05/09/22 0836 05/09/22 1549 05/11/22 1516 05/12/22 0454 05/12/22 1544 05/13/22 0314 05/13/22 1739 05/14/22 0520 05/15/22 0500  NA  --    < > 138 137 136 138 134* 139 136  K  --    < > 4.6 4.8 4.9 5.2* 4.1 4.4 5.0  CL  --    < > 106 107 107 109 99 103 98  CO2  --    < > 24 23 20* 20* 24 22 21*  GLUCOSE  --    < > 132* 154* 146* 118* 126* 98 183*  BUN  --    < > 49* 53* 76* 92* 59* 73* 112*  CREATININE  --    < > 2.18* 2.45* 3.39* 4.32* 3.25* 4.19* 5.96*  ALBUMIN <1.5*   < > 1.6* <1.5* <1.5* <1.5* 1.8* <1.5* <1.5*  CALCIUM  --    < > 7.7* 8.0* 7.7* 7.8* 7.6* 8.0* 8.4*  PHOS  --    < > 4.9* 5.4* 6.7* 7.8* 5.6* 6.2* 7.8*  AST 385*  --   --   --   --   --   --   --   --   ALT 324*  --   --   --   --   --   --   --   --    < > = values in this interval not displayed.   Liver Function Tests: Recent Labs  Lab 05/09/22 0836 05/09/22 1549 05/13/22 1739 05/14/22 0520 05/15/22 0500  AST 385*  --   --   --   --   ALT 324*  --   --   --   --   ALKPHOS 216*  --   --   --   --   BILITOT 1.6*  --   --   --   --   PROT 5.2*  --   --   --   --   ALBUMIN  <1.5*   < > 1.8* <1.5* <1.5*   < > = values in this interval not displayed.   No results for input(s): "LIPASE", "AMYLASE" in the last 168 hours. No results for input(s): "AMMONIA" in the last 168 hours. CBC: Recent Labs  Lab 05/12/22 0454 05/12/22 1455 05/13/22 0314 05/13/22 0930 05/14/22 0520 05/15/22 0500  WBC 15.4*  --  12.9* 13.4* 8.8 6.7  HGB 6.2*   < > 6.8* 8.8* 7.0* 6.9*  HCT 19.2*   < > 21.6* 26.4* 21.1* 20.9*  MCV 84.6  --  85.4 85.4 85.4 85.7  PLT 177  --  189 197 201 241   < > = values in this interval not displayed.   Cardiac Enzymes: No results for input(s): "CKTOTAL", "CKMB", "CKMBINDEX", "TROPONINI" in the last 168 hours. CBG: Recent Labs  Lab 05/14/22 1608 05/14/22 1948 05/14/22 2332 05/15/22 0319 05/15/22 0745  GLUCAP 109* 119* 122* 99 119*    Iron Studies:  Recent Labs    05/12/22 1009  IRON 20*  TIBC 245*  FERRITIN 453*   Studies/Results: No results found.  Chlorhexidine Gluconate Cloth  6 each Topical Daily   Chlorhexidine Gluconate Cloth  6 each Topical Q0600   clonazePAM  0.5 mg Per Tube BID   darbepoetin (ARANESP) injection - DIALYSIS  100 mcg Intravenous Q Wed-HD   docusate  100 mg Per Tube BID   feeding supplement (PROSource TF20)  60 mL Per Tube BID   heparin injection (subcutaneous)  5,000 Units Subcutaneous Q8H   HYDROmorphone HCl  2 mg Per Tube Q4H   Followed by   [START ON 05/16/2022] HYDROmorphone HCl  1.5 mg Per Tube Q4H   Followed by   [START ON 05/19/2022] HYDROmorphone HCl  1 mg Per Tube Q4H   Followed by   Derrill Memo ON 05/22/2022] HYDROmorphone HCl  0.5 mg Per Tube Q4H   insulin aspart  0-20 Units Subcutaneous Q4H   insulin aspart  4 Units Subcutaneous Q4H   multivitamin  1 tablet Per Tube QHS   mouth rinse  15 mL Mouth Rinse Q2H   pantoprazole  40 mg Per Tube Daily   QUEtiapine  50 mg Per Tube BID   sodium chloride flush  10-40 mL Intracatheter Q12H    BMET    Component Value Date/Time   NA 136 05/15/2022 0500    NA 139 07/02/2014 1923   K 5.0 05/15/2022 0500   K 3.9 07/02/2014 1923   CL 98 05/15/2022 0500   CL 105 07/02/2014 1923   CO2 21 (L) 05/15/2022 0500   CO2 26 07/02/2014 1923   GLUCOSE 183 (H) 05/15/2022 0500   GLUCOSE 95 07/02/2014 1923   BUN 112 (H) 05/15/2022 0500   BUN 7 07/02/2014 1923   CREATININE 5.96 (H) 05/15/2022 0500   CREATININE 0.98 07/02/2014 1923   CALCIUM 8.4 (L) 05/15/2022 0500   CALCIUM 8.5 07/02/2014 1923   GFRNONAA 8 (L) 05/15/2022 0500   GFRNONAA >60 07/02/2014 1923   GFRAA >60 04/22/2018 2215   GFRAA >60 07/02/2014 1923   CBC    Component Value Date/Time   WBC 6.7 05/15/2022 0500   RBC 2.44 (L) 05/15/2022 0500   HGB 6.9 (LL) 05/15/2022 0500   HGB 9.4 (L) 07/02/2014 1923   HCT 20.9 (L) 05/15/2022 0500   HCT 30.3 (L) 07/02/2014 1923   PLT 241 05/15/2022 0500   PLT 449 (H) 07/02/2014 1923   MCV 85.7 05/15/2022 0500   MCV 71 (L) 07/02/2014 1923   MCH 28.3 05/15/2022 0500   MCHC 33.0 05/15/2022 0500   RDW 16.0 (H) 05/15/2022 0500   RDW 18.8 (H) 07/02/2014 1923   LYMPHSABS 2.0 05/07/2022 0428   MONOABS 2.4 (H) 05/07/2022 0428   EOSABS 0.0 05/07/2022 0428   BASOSABS 0.1 05/07/2022 0428   Assessment/Plan:   AKI - likely ATN in the setting of cardiogenic and septic shock. CRRT 10/4-10/14/23 via RIJ temp HD catheter. First IHD 10/16.  No evidence of renal recovery unfortunately based on labs and no urine output with bumex challenge on Sunday.  Will keep HD on  MWF schedule. IR consulted for tunneled dialysis catheter especially since her temp line has been in since 10/4 Avoid nephrotoxic medications including NSAIDs and iodinated intravenous contrast exposure unless the latter is absolutely indicated.  Preferred narcotic agents for pain control are hydromorphone, fentanyl, and methadone. Morphine should not be used. Avoid Baclofen and avoid oral sodium phosphate and magnesium citrate based laxatives / bowel preps. Continue strict Input and Output monitoring.  Will monitor the patient closely with you and intervene or adjust therapy as indicated by changes in clinical status/labs  Cardiogenic/septic shock - weaned off pressors  Heart failure team following. Likely due to aspiration pneumonia. Acute hypoxic respiratory failure - was re-intubated 05/05/22.  Extubated again on 10/14 but failed BiPAP, s/p trach and back on Vent 05/11/22 per PCCM. TC trials per CCM Infectious enteritis - seen on CT scan.  WBC improved. Plan per PCCM. Anemia of critical illness - transfuse for Hgb <7. Requiring PRBCs. Per primary. ESA to start 10/18 Acute MI - normal coronaries by cath 10/4 AGMA - resolved. Managing with IHD Thrombocytopenia - improved/resolved S/p robotic incisional hernia repair with mesh and LOA - Surgery following.   Gean Quint, MD [facility]

## 2022-05-15 NOTE — Progress Notes (Signed)
Pt transported to and from IR on the ventilator without incident.

## 2022-05-15 NOTE — Sedation Documentation (Signed)
Dr Pascal Lux aware re tube feeds d/c at [place] and therefore will only give one drug, Versed or Fentanyl

## 2022-05-15 NOTE — Plan of Care (Signed)
Problem: Education: Goal: Knowledge of General Education information will improve Description: Including pain rating scale, medication(s)/side effects and non-pharmacologic comfort measures Outcome: Progressing   Problem: Health Behavior/Discharge Planning: Goal: Ability to manage health-related needs will improve Outcome: Progressing   Problem: Clinical Measurements: Goal: Ability to maintain clinical measurements within normal limits will improve Outcome: Progressing Goal: Will remain free from infection Outcome: Progressing Goal: Diagnostic test results will improve Outcome: Progressing Goal: Respiratory complications will improve Outcome: Progressing Goal: Cardiovascular complication will be avoided Outcome: Progressing   Problem: Activity: Goal: Risk for activity intolerance will decrease Outcome: Progressing   Problem: Nutrition: Goal: Adequate nutrition will be maintained Outcome: Not Progressing Note: R/T pt on trach TF needed at this time   Problem: Coping: Goal: Level of anxiety will decrease Outcome: Not Progressing Note: R/t patient needing precedex for Resp rate and heart rate   Problem: Elimination: Goal: Will not experience complications related to bowel motility Outcome: Not Progressing Note: R/t anuric and FMS loose stools  Goal: Will not experience complications related to urinary retention Outcome: Progressing   Problem: Pain Managment: Goal: General experience of comfort will improve Outcome: Progressing   Problem: Safety: Goal: Ability to remain free from injury will improve Outcome: Progressing   Problem: Skin Integrity: Goal: Risk for impaired skin integrity will decrease Outcome: Not Progressing Note: R/T decrease in mobility   Problem: Fluid Volume: Goal: Hemodynamic stability will improve Outcome: Progressing   Problem: Clinical Measurements: Goal: Diagnostic test results will improve Outcome: Progressing Goal: Signs and  symptoms of infection will decrease Outcome: Progressing   Problem: Respiratory: Goal: Ability to maintain adequate ventilation will improve Outcome: Not Progressing Note: R/T vent needs at this time and sedation   Problem: Education: Goal: Ability to describe self-care measures that may prevent or decrease complications (Diabetes Survival Skills Education) will improve Outcome: Progressing Goal: Individualized Educational Video(s) Outcome: Progressing   Problem: Coping: Goal: Ability to adjust to condition or change in health will improve Outcome: Progressing   Problem: Fluid Volume: Goal: Ability to maintain a balanced intake and output will improve Outcome: Not Progressing Note: R/t HD needs anuric   Problem: Health Behavior/Discharge Planning: Goal: Ability to identify and utilize available resources and services will improve Outcome: Progressing Goal: Ability to manage health-related needs will improve Outcome: Progressing   Problem: Metabolic: Goal: Ability to maintain appropriate glucose levels will improve Outcome: Progressing   Problem: Nutritional: Goal: Maintenance of adequate nutrition will improve Outcome: Not Progressing Goal: Progress toward achieving an optimal weight will improve Outcome: Not Progressing Note: On TF   Problem: Skin Integrity: Goal: Risk for impaired skin integrity will decrease Outcome: Not Progressing Note: General weakness and invasive lines total care    Problem: Tissue Perfusion: Goal: Adequacy of tissue perfusion will improve Outcome: Not Progressing Note: R/T Immobility total care   Problem: Activity: Goal: Ability to tolerate increased activity will improve Outcome: Not Progressing   Problem: Respiratory: Goal: Ability to maintain a clear airway and adequate ventilation will improve Outcome: Not Progressing   Problem: Role Relationship: Goal: Method of communication will improve Outcome: Progressing   Problem:  Education: Goal: Understanding of CV disease, CV risk reduction, and recovery process will improve Outcome: Progressing Goal: Individualized Educational Video(s) Outcome: Progressing   Problem: Activity: Goal: Ability to return to baseline activity level will improve Outcome: Not Progressing   Problem: Cardiovascular: Goal: Ability to achieve and maintain adequate cardiovascular perfusion will improve Outcome: Not Progressing Note: R/T sinus tachy Goal: Vascular access site(s)  Level 0-1 will be maintained Outcome: Progressing   Problem: Health Behavior/Discharge Planning: Goal: Ability to safely manage health-related needs after discharge will improve Outcome: Progressing   Problem: Education: Goal: 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 Outcome: Progressing   Problem: Activity: Goal: Ability to tolerate increased activity will improve Outcome: Not Progressing   Problem: Health Behavior/Discharge Planning: Goal: Ability to manage 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 Outcome: Progressing   Problem: Respiratory: Goal: Patent airway maintenance will improve Outcome: Not Progressing

## 2022-05-16 DIAGNOSIS — J690 Pneumonitis due to inhalation of food and vomit: Secondary | ICD-10-CM | POA: Diagnosis not present

## 2022-05-16 DIAGNOSIS — I5021 Acute systolic (congestive) heart failure: Secondary | ICD-10-CM | POA: Diagnosis not present

## 2022-05-16 DIAGNOSIS — J9601 Acute respiratory failure with hypoxia: Secondary | ICD-10-CM | POA: Diagnosis not present

## 2022-05-16 DIAGNOSIS — A419 Sepsis, unspecified organism: Secondary | ICD-10-CM | POA: Diagnosis not present

## 2022-05-16 LAB — RENAL FUNCTION PANEL
Albumin: 1.6 g/dL — ABNORMAL LOW (ref 3.5–5.0)
Albumin: 1.6 g/dL — ABNORMAL LOW (ref 3.5–5.0)
Anion gap: 13 (ref 5–15)
Anion gap: 12 (ref 5–15)
BUN: 73 mg/dL — ABNORMAL HIGH (ref 6–20)
BUN: 101 mg/dL — ABNORMAL HIGH (ref 6–20)
CO2: 25 mmol/L (ref 22–32)
CO2: 25 mmol/L (ref 22–32)
Calcium: 8.2 mg/dL — ABNORMAL LOW (ref 8.9–10.3)
Calcium: 8.2 mg/dL — ABNORMAL LOW (ref 8.9–10.3)
Chloride: 96 mmol/L — ABNORMAL LOW (ref 98–111)
Chloride: 97 mmol/L — ABNORMAL LOW (ref 98–111)
Creatinine, Ser: 4.51 mg/dL — ABNORMAL HIGH (ref 0.44–1.00)
Creatinine, Ser: 5.49 mg/dL — ABNORMAL HIGH (ref 0.44–1.00)
GFR, Estimated: 11 mL/min — ABNORMAL LOW (ref >60)
GFR, Estimated: 9 mL/min — ABNORMAL LOW (ref >60)
Glucose, Bld: 132 mg/dL — ABNORMAL HIGH (ref 70–99)
Glucose, Bld: 132 mg/dL — ABNORMAL HIGH (ref 70–99)
Phosphorus: 6.6 mg/dL — ABNORMAL HIGH (ref 2.5–4.6)
Phosphorus: 7.4 mg/dL — ABNORMAL HIGH (ref 2.5–4.6)
Potassium: 4.7 mmol/L (ref 3.5–5.1)
Potassium: 5.2 mmol/L — ABNORMAL HIGH (ref 3.5–5.1)
Sodium: 134 mmol/L — ABNORMAL LOW (ref 135–145)
Sodium: 134 mmol/L — ABNORMAL LOW (ref 135–145)

## 2022-05-16 LAB — TYPE AND SCREEN
ABO/RH(D): O POS
Antibody Screen: NEGATIVE
Unit division: 0
Unit division: 0
Unit division: 0

## 2022-05-16 LAB — MAGNESIUM: Magnesium: 2 mg/dL (ref 1.7–2.4)

## 2022-05-16 LAB — CBC
HCT: 23.1 % — ABNORMAL LOW (ref 36.0–46.0)
Hemoglobin: 7.7 g/dL — ABNORMAL LOW (ref 12.0–15.0)
MCH: 28 pg (ref 26.0–34.0)
MCHC: 33.3 g/dL (ref 30.0–36.0)
MCV: 84 fL (ref 80.0–100.0)
Platelets: 271 10*3/uL (ref 150–400)
RBC: 2.75 MIL/uL — ABNORMAL LOW (ref 3.87–5.11)
RDW: 15.9 % — ABNORMAL HIGH (ref 11.5–15.5)
WBC: 6.2 10*3/uL (ref 4.0–10.5)
nRBC: 0 % (ref 0.0–0.2)

## 2022-05-16 LAB — GLUCOSE, CAPILLARY
Glucose-Capillary: 126 mg/dL — ABNORMAL HIGH (ref 70–99)
Glucose-Capillary: 144 mg/dL — ABNORMAL HIGH (ref 70–99)
Glucose-Capillary: 152 mg/dL — ABNORMAL HIGH (ref 70–99)
Glucose-Capillary: 124 mg/dL — ABNORMAL HIGH (ref 70–99)
Glucose-Capillary: 145 mg/dL — ABNORMAL HIGH (ref 70–99)

## 2022-05-16 LAB — BPAM RBC
Blood Product Expiration Date: 202311152359
Blood Product Expiration Date: 202311152359
Blood Product Expiration Date: 202311152359
ISSUE DATE / TIME: 202310150901
ISSUE DATE / TIME: 202310160503
ISSUE DATE / TIME: 202310180639
Unit Type and Rh: 5100
Unit Type and Rh: 5100
Unit Type and Rh: 5100

## 2022-05-16 MED ORDER — DARBEPOETIN ALFA 100 MCG/0.5ML IJ SOSY
100.0000 ug | PREFILLED_SYRINGE | INTRAMUSCULAR | Status: DC
  Administered 2022-05-17: 100 ug via INTRAVENOUS
  Filled 2022-05-16 (×2): qty 0.5

## 2022-05-16 MED ORDER — ACETAMINOPHEN 325 MG PO TABS
650.0000 mg | ORAL_TABLET | ORAL | Status: DC | PRN
  Administered 2022-05-16 – 2022-05-19 (×5): 650 mg
  Filled 2022-05-16 (×5): qty 2

## 2022-05-16 NOTE — Progress Notes (Signed)
Pt placed on trach col. 8L 35% pt tolerating well at this time, no increased WOB noted, MD aware, vitals stable,RN aware, RT will monitor.

## 2022-05-16 NOTE — Progress Notes (Signed)
Patient seen today by trach team for consult.  No education is needed at this time.  All necessary equipment is at beside.   Will continue to follow for progression. Patient is still on full ventilatory support. 

## 2022-05-16 NOTE — Progress Notes (Signed)
Patient ID: Patricia [name], female   DOB: February 27, 1971, 51 y.o.   MRN: [id number] S: no acute events, s/p hd yesterday w/ net UF 3L, s/p RIJ TDC w/ IR yesterday O:BP 111/74 (BP Location: Left Arm)   Pulse (!) 116   Temp 99.2 F (37.3 C) (Oral)   Resp (!) 27   Ht _0  (1.626 m)   Wt 110.6 kg   LMP 07/07/2014 (Approximate)   SpO2 93%   BMI 41.85 kg/m   Intake/Output Summary (Last 24 hours) at 05/16/2022 0837 Last data filed at 05/16/2022 0800 Gross per 24 hour  Intake 1450.08 ml  Output 3100 ml  Net -1649.92 ml   Intake/Output: I/O last 3 completed shifts: In: 2096.5 [I.V.:123.7; Blood:360; NG/GT:1290; IV Piggyback:322.7] Out: 3150 [Other:3000; Stool:150]  Intake/Output this shift:  Total I/O In: 90 [NG/GT:90] Out: -  Weight change: 0 kg Gen:on vent via trach, awake CVS: RRR Resp:Ventilated BS bilaterally Abd: +BS, soft, NT Ext: edema of hands bilaterally legs wrapped Neuro: awake  Recent Labs  Lab 05/12/22 1544 05/13/22 0314 05/13/22 1739 05/14/22 0520 05/15/22 0500 05/15/22 1333 05/16/22 0407  NA 136 138 134* 139 136 135 134*  K 4.9 5.2* 4.1 4.4 5.0 4.6 4.7  CL 107 109 99 103 98 103 96*  CO2 20* 20* 24 22 21* 18* 25  GLUCOSE 146* 118* 126* 98 183* 220* 132*  BUN 76* 92* 59* 73* 112* 105* 73*  CREATININE 3.39* 4.32* 3.25* 4.19* 5.96* 5.61* 4.51*  ALBUMIN <1.5* <1.5* 1.8* <1.5* <1.5* <1.5* 1.6*  CALCIUM 7.7* 7.8* 7.6* 8.0* 8.4* 7.0* 8.2*  PHOS 6.7* 7.8* 5.6* 6.2* 7.8* 7.0* 6.6*   Liver Function Tests: Recent Labs  Lab 05/15/22 0500 05/15/22 1333 05/16/22 0407  ALBUMIN <1.5* <1.5* 1.6*   No results for input(s): "LIPASE", "AMYLASE" in the last 168 hours. No results for input(s): "AMMONIA" in the last 168 hours. CBC: Recent Labs  Lab 05/13/22 0314 05/13/22 0930 05/14/22 0520 05/15/22 0500 05/16/22 0407  WBC 12.9* 13.4* 8.8 6.7 6.2  HGB 6.8* 8.8* 7.0* 6.9* 7.7*  HCT 21.6* 26.4* 21.1* 20.9* 23.1*  MCV 85.4 85.4 85.4 85.7 84.0  PLT 189 197 201  241 271   Cardiac Enzymes: No results for input(s): "CKTOTAL", "CKMB", "CKMBINDEX", "TROPONINI" in the last 168 hours. CBG: Recent Labs  Lab 05/15/22 1621 05/15/22 1955 05/15/22 2322 05/16/22 0412 05/16/22 0812  GLUCAP 130* 158* 137* 126* 144*    Iron Studies:  Recent Labs    05/15/22 1333  FERRITIN 167   Studies/Results: IR Fluoro Guide CV Line Right  Result Date: 05/15/2022 INDICATION: End-stage renal disease. In need of durable intravenous access for hemodialysis. EXAM: TUNNELED CENTRAL VENOUS HEMODIALYSIS CATHETER PLACEMENT WITH ULTRASOUND AND FLUOROSCOPIC GUIDANCE MEDICATIONS: Ancef 2 gm IV. The antibiotic was given in an appropriate time interval prior to skin puncture. ANESTHESIA/SEDATION: Moderate (conscious) sedation was employed during this procedure. A total of Fentanyl 50 mcg was administered intravenously. Moderate Sedation Time: 15 minutes. The patient's level of consciousness and vital signs were monitored continuously by radiology nursing throughout the procedure under my direct supervision. FLUOROSCOPY TIME:  36 seconds (8 mGy) COMPLICATIONS: None immediate. PROCEDURE: Informed written consent was obtained from the patient after a discussion of the risks, benefits, and alternatives to treatment. Questions regarding the procedure were encouraged and answered. The right neck and chest were prepped with chlorhexidine in a sterile fashion, and a sterile drape was applied covering the operative field. Maximum barrier sterile technique with sterile gowns and  gloves were used for the procedure. A timeout was performed prior to the initiation of the procedure. After creating a small venotomy incision, a micropuncture kit was utilized to access the internal jugular vein. Real-time ultrasound guidance was utilized for vascular access including the acquisition of a permanent ultrasound image documenting patency of the accessed vessel. The microwire was utilized to measure appropriate  catheter length. A stiff Glidewire was advanced to the level of the IVC and the micropuncture sheath was exchanged for a peel-away sheath. A palindrome tunneled hemodialysis catheter measuring 19 cm from tip to cuff was tunneled in a retrograde fashion from the anterior chest wall to the venotomy incision. The catheter was then placed through the peel-away sheath with tips ultimately positioned within the superior aspect of the right atrium. Final catheter positioning was confirmed and documented with a spot radiographic image. The catheter aspirates and flushes normally. The catheter was flushed with appropriate volume heparin dwells. The catheter exit site was secured with a 0-Prolene retention suture. The venotomy incision was closed with Dermabond and Steri-strips. Dressings were applied. The patient tolerated the procedure well without immediate post procedural complication. IMPRESSION: Successful placement of 19 cm tip to cuff tunneled hemodialysis catheter via the right internal jugular vein with tips terminating within the superior aspect of the right atrium. The catheter is ready for immediate use. Electronically Signed   By: Sandi Mariscal M.D.   On: 05/15/2022 12:18   IR US Guide Vasc Access Right  Result Date: 05/15/2022 INDICATION: End-stage renal disease. In need of durable intravenous access for hemodialysis. EXAM: TUNNELED CENTRAL VENOUS HEMODIALYSIS CATHETER PLACEMENT WITH ULTRASOUND AND FLUOROSCOPIC GUIDANCE MEDICATIONS: Ancef 2 gm IV. The antibiotic was given in an appropriate time interval prior to skin puncture. ANESTHESIA/SEDATION: Moderate (conscious) sedation was employed during this procedure. A total of Fentanyl 50 mcg was administered intravenously. Moderate Sedation Time: 15 minutes. The patient's level of consciousness and vital signs were monitored continuously by radiology nursing throughout the procedure under my direct supervision. FLUOROSCOPY TIME:  36 seconds (8 mGy)  COMPLICATIONS: None immediate. PROCEDURE: Informed written consent was obtained from the patient after a discussion of the risks, benefits, and alternatives to treatment. Questions regarding the procedure were encouraged and answered. The right neck and chest were prepped with chlorhexidine in a sterile fashion, and a sterile drape was applied covering the operative field. Maximum barrier sterile technique with sterile gowns and gloves were used for the procedure. A timeout was performed prior to the initiation of the procedure. After creating a small venotomy incision, a micropuncture kit was utilized to access the internal jugular vein. Real-time ultrasound guidance was utilized for vascular access including the acquisition of a permanent ultrasound image documenting patency of the accessed vessel. The microwire was utilized to measure appropriate catheter length. A stiff Glidewire was advanced to the level of the IVC and the micropuncture sheath was exchanged for a peel-away sheath. A palindrome tunneled hemodialysis catheter measuring 19 cm from tip to cuff was tunneled in a retrograde fashion from the anterior chest wall to the venotomy incision. The catheter was then placed through the peel-away sheath with tips ultimately positioned within the superior aspect of the right atrium. Final catheter positioning was confirmed and documented with a spot radiographic image. The catheter aspirates and flushes normally. The catheter was flushed with appropriate volume heparin dwells. The catheter exit site was secured with a 0-Prolene retention suture. The venotomy incision was closed with Dermabond and Steri-strips. Dressings were applied. The patient  tolerated the procedure well without immediate post procedural complication. IMPRESSION: Successful placement of 19 cm tip to cuff tunneled hemodialysis catheter via the right internal jugular vein with tips terminating within the superior aspect of the right atrium. The  catheter is ready for immediate use. Electronically Signed   By: Sandi Mariscal M.D.   On: 05/15/2022 12:18    Chlorhexidine Gluconate Cloth  6 each Topical Daily   Chlorhexidine Gluconate Cloth  6 each Topical Q0600   clonazePAM  0.5 mg Per Tube BID   darbepoetin (ARANESP) injection - DIALYSIS  100 mcg Intravenous Q Wed-HD   feeding supplement (PROSource TF20)  60 mL Per Tube BID   fiber  1 packet Per Tube BID   heparin injection (subcutaneous)  5,000 Units Subcutaneous Q8H   HYDROmorphone HCl  2 mg Per Tube Q4H   Followed by   HYDROmorphone HCl  1.5 mg Per Tube Q4H   Followed by   [START ON 05/19/2022] HYDROmorphone HCl  1 mg Per Tube Q4H   Followed by   [START ON 05/22/2022] HYDROmorphone HCl  0.5 mg Per Tube Q4H   insulin aspart  0-20 Units Subcutaneous Q4H   insulin aspart  4 Units Subcutaneous Q4H   multivitamin  1 tablet Per Tube QHS   nutrition supplement (JUVEN)  1 packet Oral BID BM   mouth rinse  15 mL Mouth Rinse Q2H   pantoprazole  40 mg Per Tube Daily   QUEtiapine  50 mg Per Tube BID   sodium chloride flush  10-40 mL Intracatheter Q12H    BMET    Component Value Date/Time   NA 134 (L) 05/16/2022 0407   NA 139 07/02/2014 1923   K 4.7 05/16/2022 0407   K 3.9 07/02/2014 1923   CL 96 (L) 05/16/2022 0407   CL 105 07/02/2014 1923   CO2 25 05/16/2022 0407   CO2 26 07/02/2014 1923   GLUCOSE 132 (H) 05/16/2022 0407   GLUCOSE 95 07/02/2014 1923   BUN 73 (H) 05/16/2022 0407   BUN 7 07/02/2014 1923   CREATININE 4.51 (H) 05/16/2022 0407   CREATININE 0.98 07/02/2014 1923   CALCIUM 8.2 (L) 05/16/2022 0407   CALCIUM 8.5 07/02/2014 1923   GFRNONAA 11 (L) 05/16/2022 0407   GFRNONAA >60 07/02/2014 1923   GFRAA >60 04/22/2018 2215   GFRAA >60 07/02/2014 1923   CBC    Component Value Date/Time   WBC 6.2 05/16/2022 0407   RBC 2.75 (L) 05/16/2022 0407   HGB 7.7 (L) 05/16/2022 0407   HGB 9.4 (L) 07/02/2014 1923   HCT 23.1 (L) 05/16/2022 0407   HCT 30.3 (L) 07/02/2014 1923    PLT 271 05/16/2022 0407   PLT 449 (H) 07/02/2014 1923   MCV 84.0 05/16/2022 0407   MCV 71 (L) 07/02/2014 1923   MCH 28.0 05/16/2022 0407   MCHC 33.3 05/16/2022 0407   RDW 15.9 (H) 05/16/2022 0407   RDW 18.8 (H) 07/02/2014 1923   LYMPHSABS 2.0 05/07/2022 0428   MONOABS 2.4 (H) 05/07/2022 0428   EOSABS 0.0 05/07/2022 0428   BASOSABS 0.1 05/07/2022 0428   Assessment/Plan:   AKI - likely ATN in the setting of cardiogenic and septic shock. CRRT 10/4-10/14/23 via RIJ temp HD catheter. First IHD 10/16.  No evidence of renal recovery unfortunately based on labs and no urine output with bumex challenge last Sunday. Remains anuric Will keep HD on MWF schedule. S/p [facility] 10/18 Avoid nephrotoxic medications including NSAIDs and iodinated intravenous contrast exposure  unless the latter is absolutely indicated.  Preferred narcotic agents for pain control are hydromorphone, fentanyl, and methadone. Morphine should not be used. Avoid Baclofen and avoid oral sodium phosphate and magnesium citrate based laxatives / bowel preps. Continue strict Input and Output monitoring. Will monitor the patient closely with you and intervene or adjust therapy as indicated by changes in clinical status/labs  Cardiogenic/septic shock - off pressors now.  Heart failure team had been following. Likely due to aspiration pneumonia. Acute hypoxic respiratory failure - was re-intubated 05/05/22.  Extubated again on 10/14 but failed BiPAP, s/p trach and back on Vent 05/11/22 per PCCM. TC trials per CCM Infectious enteritis - seen on CT scan.  WBC improved. Plan per PCCM. Anemia of critical illness - transfuse for Hgb <7. Requiring PRBCs. Per primary. ESA started 10/18 Acute MI - normal coronaries by cath 10/4 AGMA - resolved. Managing with IHD Thrombocytopenia - improved/resolved S/p robotic incisional hernia repair with mesh and LOA - Surgery following.   Gean Quint, MD [facility]

## 2022-05-16 NOTE — Progress Notes (Signed)
15 Days Post-Op   Subjective/Chief Complaint: Tolerated HD Complaining of some abdominal pain today  Objective: Vital signs in last 24 hours: Temp:  [98.2 F (36.8 C)-100.9 F (38.3 C)] 99.2 F (37.3 C) (10/19 0814) Pulse Rate:  [105-119] 119 (10/19 1100) Resp:  [18-33] 31 (10/19 1100) BP: (100-145)/(59-102) 119/68 (10/19 1100) SpO2:  [86 %-100 %] 98 % (10/19 1100) FiO2 (%):  [30 %-100 %] 35 % (10/19 0930) Weight:  [110.6 kg-115.5 kg] 110.6 kg (10/19 0500) Last BM Date : 05/16/22  Intake/Output from previous day: 10/18 0701 - 10/19 0700 In: 1379.6 [I.V.:19.5; Blood:360; NG/GT:900; IV Piggyback:100.1] Out: 3100 [Stool:100] Intake/Output this shift: Total I/O In: 440 [NG/GT:440] Out: -   Exam: Sitting up in bed Trach, on vent Follows commands Abdomen soft, obese, mild TTP. Incisions c/d/i  Lab Results:  Recent Labs    05/15/22 0500 05/16/22 0407  WBC 6.7 6.2  HGB 6.9* 7.7*  HCT 20.9* 23.1*  PLT 241 271    BMET Recent Labs    05/15/22 1333 05/16/22 0407  NA 135 134*  K 4.6 4.7  CL 103 96*  CO2 18* 25  GLUCOSE 220* 132*  BUN 105* 73*  CREATININE 5.61* 4.51*  CALCIUM 7.0* 8.2*    PT/INR No results for input(s): "LABPROT", "INR" in the last 72 hours. ABG No results for input(s): "PHART", "HCO3" in the last 72 hours.  Invalid input(s): "PCO2", "PO2"   Studies/Results: IR Fluoro Guide CV Line Right  Result Date: 05/15/2022 INDICATION: End-stage renal disease. In need of durable intravenous access for hemodialysis. EXAM: TUNNELED CENTRAL VENOUS HEMODIALYSIS CATHETER PLACEMENT WITH ULTRASOUND AND FLUOROSCOPIC GUIDANCE MEDICATIONS: Ancef 2 gm IV. The antibiotic was given in an appropriate time interval prior to skin puncture. ANESTHESIA/SEDATION: Moderate (conscious) sedation was employed during this procedure. A total of Fentanyl 50 mcg was administered intravenously. Moderate Sedation Time: 15 minutes. The patient's level of consciousness and vital  signs were monitored continuously by radiology nursing throughout the procedure under my direct supervision. FLUOROSCOPY TIME:  36 seconds (8 mGy) COMPLICATIONS: None immediate. PROCEDURE: Informed written consent was obtained from the patient after a discussion of the risks, benefits, and alternatives to treatment. Questions regarding the procedure were encouraged and answered. The right neck and chest were prepped with chlorhexidine in a sterile fashion, and a sterile drape was applied covering the operative field. Maximum barrier sterile technique with sterile gowns and gloves were used for the procedure. A timeout was performed prior to the initiation of the procedure. After creating a small venotomy incision, a micropuncture kit was utilized to access the internal jugular vein. Real-time ultrasound guidance was utilized for vascular access including the acquisition of a permanent ultrasound image documenting patency of the accessed vessel. The microwire was utilized to measure appropriate catheter length. A stiff Glidewire was advanced to the level of the IVC and the micropuncture sheath was exchanged for a peel-away sheath. A palindrome tunneled hemodialysis catheter measuring 19 cm from tip to cuff was tunneled in a retrograde fashion from the anterior chest wall to the venotomy incision. The catheter was then placed through the peel-away sheath with tips ultimately positioned within the superior aspect of the right atrium. Final catheter positioning was confirmed and documented with a spot radiographic image. The catheter aspirates and flushes normally. The catheter was flushed with appropriate volume heparin dwells. The catheter exit site was secured with a 0-Prolene retention suture. The venotomy incision was closed with Dermabond and Steri-strips. Dressings were applied. The patient tolerated  the procedure well without immediate post procedural complication. IMPRESSION: Successful placement of 19 cm tip  to cuff tunneled hemodialysis catheter via the right internal jugular vein with tips terminating within the superior aspect of the right atrium. The catheter is ready for immediate use. Electronically Signed   By: Sandi Mariscal M.D.   On: 05/15/2022 12:18   IR US Guide Vasc Access Right  Result Date: 05/15/2022 INDICATION: End-stage renal disease. In need of durable intravenous access for hemodialysis. EXAM: TUNNELED CENTRAL VENOUS HEMODIALYSIS CATHETER PLACEMENT WITH ULTRASOUND AND FLUOROSCOPIC GUIDANCE MEDICATIONS: Ancef 2 gm IV. The antibiotic was given in an appropriate time interval prior to skin puncture. ANESTHESIA/SEDATION: Moderate (conscious) sedation was employed during this procedure. A total of Fentanyl 50 mcg was administered intravenously. Moderate Sedation Time: 15 minutes. The patient's level of consciousness and vital signs were monitored continuously by radiology nursing throughout the procedure under my direct supervision. FLUOROSCOPY TIME:  36 seconds (8 mGy) COMPLICATIONS: None immediate. PROCEDURE: Informed written consent was obtained from the patient after a discussion of the risks, benefits, and alternatives to treatment. Questions regarding the procedure were encouraged and answered. The right neck and chest were prepped with chlorhexidine in a sterile fashion, and a sterile drape was applied covering the operative field. Maximum barrier sterile technique with sterile gowns and gloves were used for the procedure. A timeout was performed prior to the initiation of the procedure. After creating a small venotomy incision, a micropuncture kit was utilized to access the internal jugular vein. Real-time ultrasound guidance was utilized for vascular access including the acquisition of a permanent ultrasound image documenting patency of the accessed vessel. The microwire was utilized to measure appropriate catheter length. A stiff Glidewire was advanced to the level of the IVC and the  micropuncture sheath was exchanged for a peel-away sheath. A palindrome tunneled hemodialysis catheter measuring 19 cm from tip to cuff was tunneled in a retrograde fashion from the anterior chest wall to the venotomy incision. The catheter was then placed through the peel-away sheath with tips ultimately positioned within the superior aspect of the right atrium. Final catheter positioning was confirmed and documented with a spot radiographic image. The catheter aspirates and flushes normally. The catheter was flushed with appropriate volume heparin dwells. The catheter exit site was secured with a 0-Prolene retention suture. The venotomy incision was closed with Dermabond and Steri-strips. Dressings were applied. The patient tolerated the procedure well without immediate post procedural complication. IMPRESSION: Successful placement of 19 cm tip to cuff tunneled hemodialysis catheter via the right internal jugular vein with tips terminating within the superior aspect of the right atrium. The catheter is ready for immediate use. Electronically Signed   By: Sandi Mariscal M.D.   On: 05/15/2022 12:18    Anti-infectives: Anti-infectives (From admission, onward)    Start     Dose/Rate Route Frequency Ordered Stop   05/15/22 1445  ceFAZolin (ANCEF) IVPB 2g/100 mL premix       Note to Pharmacy: To be given in IR   2 g 200 mL/hr over 30 Minutes Intravenous  Once 05/15/22 1352 05/15/22 1616   05/15/22 1230  ceFAZolin (ANCEF) powder 1 g  Status:  Discontinued        1 g Other To Surgery 05/15/22 1140 05/15/22 1239   05/15/22 1230  ceFAZolin (ANCEF) IVPB 2g/100 mL premix        2 g 200 mL/hr over 30 Minutes Intravenous To Radiology 05/15/22 1140 05/15/22 1140   05/15/22 0000  ceFAZolin (ANCEF) IVPB 2g/100 mL premix        2 g 200 mL/hr over 30 Minutes Intravenous To Radiology 05/14/22 1308 05/15/22 0005   05/13/22 2134  meropenem (MERREM) 500 mg in sodium chloride 0.9 % 100 mL IVPB        500 mg 200 mL/hr  over 30 Minutes Intravenous Every 24 hours 05/13/22 0815 05/17/22 2144   05/13/22 1000  meropenem (MERREM) 500 mg in sodium chloride 0.9 % 100 mL IVPB  Status:  Discontinued        500 mg 200 mL/hr over 30 Minutes Intravenous Every 12 hours 05/12/22 0940 05/12/22 0946   05/12/22 2200  meropenem (MERREM) 500 mg in sodium chloride 0.9 % 100 mL IVPB  Status:  Discontinued        500 mg 200 mL/hr over 30 Minutes Intravenous Every 12 hours 05/12/22 0946 05/13/22 0815   05/12/22 2000  meropenem (MERREM) 1 g in sodium chloride 0.9 % 100 mL IVPB  Status:  Discontinued        1 g 200 mL/hr over 30 Minutes Intravenous  Once 05/12/22 0940 05/12/22 0946   05/06/22 1530  meropenem (MERREM) 1 g in sodium chloride 0.9 % 100 mL IVPB  Status:  Discontinued        1 g 200 mL/hr over 30 Minutes Intravenous Every 8 hours 05/06/22 1438 05/12/22 0940   05/04/22 1000  vancomycin (VANCOCIN) IVPB 1000 mg/200 mL premix  Status:  Discontinued        1,000 mg 200 mL/hr over 60 Minutes Intravenous Every 24 hours 05/03/22 0802 05/08/22 0913   05/03/22 0845  vancomycin (VANCOREADY) IVPB 2000 mg/400 mL        2,000 mg 200 mL/hr over 120 Minutes Intravenous  Once 05/03/22 0751 05/03/22 1114   05/01/22 1800  piperacillin-tazobactam (ZOSYN) IVPB 3.375 g  Status:  Discontinued        3.375 g 12.5 mL/hr over 240 Minutes Intravenous Every 6 hours 05/01/22 1426 05/01/22 1427   05/01/22 1515  piperacillin-tazobactam (ZOSYN) IVPB 3.375 g  Status:  Discontinued        3.375 g 100 mL/hr over 30 Minutes Intravenous Every 6 hours 05/01/22 1428 05/06/22 1438   04/30/22 1400  piperacillin-tazobactam (ZOSYN) IVPB 2.25 g  Status:  Discontinued        2.25 g 100 mL/hr over 30 Minutes Intravenous Every 8 hours 04/30/22 1325 05/01/22 1426   04/29/22 1530  piperacillin-tazobactam (ZOSYN) IVPB 3.375 g  Status:  Discontinued        3.375 g 12.5 mL/hr over 240 Minutes Intravenous Every 8 hours 04/29/22 1433 04/30/22 1325   04/29/22 1145   metroNIDAZOLE (FLAGYL) IVPB 500 mg  Status:  Discontinued        500 mg 100 mL/hr over 60 Minutes Intravenous Every 12 hours 04/29/22 1047 04/29/22 1433   04/29/22 1145  vancomycin (VANCOCIN) IVPB 1000 mg/200 mL premix  Status:  Discontinued        1,000 mg 200 mL/hr over 60 Minutes Intravenous  Once 04/29/22 1047 04/29/22 1055   04/29/22 1145  vancomycin (VANCOREADY) IVPB 1500 mg/300 mL  Status:  Discontinued        1,500 mg 150 mL/hr over 120 Minutes Intravenous Every 24 hours 04/29/22 1055 04/29/22 1357   04/29/22 1045  ceFEPIme (MAXIPIME) 2 g in sodium chloride 0.9 % 100 mL IVPB  Status:  Discontinued        2 g 200 mL/hr over 30 Minutes Intravenous Every  8 hours 04/29/22 0954 04/29/22 1433   04/23/22 0730  ceFAZolin (ANCEF) IVPB 2g/100 mL premix        2 g 200 mL/hr over 30 Minutes Intravenous On call to O.R. 04/23/22 0726 04/23/22 8978       Assessment/Plan: Patricia [name] is s/p robotic incisional hernia repair on 04/23/22 -Postop ileus complicated by aspiration: Ileus resolved, having bowel function.  Tolerating tube feeds at goal - Inflammatory enteritis likely due to ischemic insult with dilated bowel at time of high pressor support - Abdomen fairly benign on exam. - WBC normalized   Acute metabolic encephalopathy superimposed on hx bipolar disorder, schizophrenia   ICU delirium- improved now off all IV infusions. Acute biventricular HFrEF (Etiology sepsis, stress induced CM. Has clean coronaries) Cardiogenic shock resolved, EF improved.  Acute hypoxic respiratory failure Aspiration PNA Pulm edema   RLL VAP  AKI with oliguria; no baseline CKD  Anemia, acute on chronic  DM2 with hyperglycemia  Severe protein calorie malnutrition  Pressure injury lip - improving with local care and trach Deconditioning   Appreciate CCM Care with multiple issues   Dispo - CM consult   Felicie Morn, [facility] 05/16/2022, 11:16 AM Please see Amion for pager  number during day hours 7:00am-4:30pm

## 2022-05-16 NOTE — Progress Notes (Signed)
PT Cancellation Note  Patient Details Name: Patricia [name] MRN: [id number] DOB: Mar 29, 1971   Cancelled Treatment:    Reason Eval/Treat Not Completed: Pain limiting ability to participate (pt reports abdominal pain 10/10 and unable to participate at this time. RN made aware)   Sandy Salaam Allayna Erlich 05/16/2022, 7:56 AM Bayard Males, PT Acute Rehabilitation Services Office: [phone]

## 2022-05-16 NOTE — Consult Note (Signed)
WOC Nurse Consult Note: Reason for Consult:device related pressure injury to lip.  Mucus membranes are not stageable, but full thickness injury.  Wound type: device related pressure injury to upper lip from ET tube. Is now extubated and has trach.  Pressure Injury POA: No Measurement: 0.5 cm x 1 cm scabbed tissue Wound HKF:[id number], dark tissue Drainage (amount, consistency, odor) none Periwound:edema to upper lip.  Dressing procedure/placement/frequency: Cleanse lip wound with peroxide on cotton tip applicator twice daily to remove scabbing and debris.  Moisturize lips with petrolatum ointment.  Will not follow at this time.  Please re-consult if needed.  Domenic Moras MSN, RN, FNP-BC CWON Wound, Ostomy, Continence Nurse Pager [phone]

## 2022-05-16 NOTE — Progress Notes (Signed)
Physical Therapy Treatment Patient Details Name: Patricia [name] MRN: [id number] DOB: 1970/08/24 Today's Date: 05/16/2022   History of Present Illness 51 yo female admitted 04/23/22 for excisional hernia repair with mesh, ileus post op on TNA. Developed vomiting and hypotension after NGT removed 04/29/22 and PNA on CXR, was tachycardic. DX with sepsis and ABX started. Pt admitted to ICU on 10/2 on levophed. 10/3 intubated for worsening metabolic/lactic acidosis. 10/4 cardiac cath with EF 15%; She had AKI as well. CRRT 10/5-10/14. Failed extubation 10/8 and 10/14 with trach 10/14. PMhx: depression, OSA, asthma, schizophrenia, DM, insomnia    PT Comments    Pt awake, alert and gesturing to abdominal pain despite premedication. Pt with increased time to reposition for comfort but willing to sit in full chair with feet on floor. Pt with improved sitting balance tolerating 10 min unsupported with cues for decreased RR at times on PS/CPAP. Pt with AAROM limited by pain. Pt encouraged to continue to move extremities for strengthening and will continue to follow with pt in partial chair position end of session.   HR 115 RR 28-42 FiO2 30% and peep 5    Recommendations for follow up therapy are one component of a multi-disciplinary discharge planning process, led by the attending physician.  Recommendations may be updated based on patient status, additional functional criteria and insurance authorization.  Follow Up Recommendations  PT at Long-term acute care hospital     Assistance Recommended at Discharge Frequent or constant Supervision/Assistance  Patient can return home with the following Two people to help with walking and/or transfers;Two people to help with bathing/dressing/bathroom;Assistance with cooking/housework;Assistance with feeding;Direct supervision/assist for medications management;Direct supervision/assist for financial management;Assist for transportation;Help with stairs or ramp  for entrance   Equipment Recommendations  Hospital bed;Wheelchair (measurements PT);Wheelchair cushion (measurements PT)    Recommendations for Other Services       Precautions / Restrictions Precautions Precautions: Fall;Other (comment) Precaution Comments: new trach, vent, cortrak, flexiseal Restrictions Weight Bearing Restrictions: No     Mobility  Bed Mobility Overal bed mobility: Needs Assistance Bed Mobility: Supine to Sit, Sit to Supine, Rolling Rolling: Max assist         General bed mobility comments: foot egress positioning to achieve supine<>sit. Max +2 to slide toward [facility]. Rolling with max assist    Transfers                   General transfer comment: pt declined attempting to stand today. Pt able to sit unsupported in full chair position 10 min    Ambulation/Gait                   Stairs             Wheelchair Mobility    Modified Rankin (Stroke Patients Only)       Balance     Sitting balance-Leahy Scale: Fair Sitting balance - Comments: static sitting without support                                    Cognition Arousal/Alertness: Awake/alert Behavior During Therapy: Flat affect Overall Cognitive Status: Difficult to assess                                 General Comments: pt nodding and using minimal hand gestures to communicate, following single step commands  appropriately        Exercises General Exercises - Upper Extremity Shoulder Flexion: AAROM, Both, 10 reps, Seated General Exercises - Lower Extremity Short Arc Quad: AROM, Both, 10 reps, Seated    General Comments        Pertinent Vitals/Pain Pain Assessment Pain Score: 8  Pain Location: abdomen Pain Descriptors / Indicators: Constant Pain Intervention(s): Limited activity within patient's tolerance, Monitored during session, Premedicated before session, Repositioned    Home Living                           Prior Function            PT Goals (current goals can now be found in the care plan section) Progress towards PT goals: Progressing toward goals    Frequency    Min 3X/week      PT Plan Current plan remains appropriate    Co-evaluation              AM-PAC PT "6 Clicks" Mobility   Outcome Measure  Help needed turning from your back to your side while in a flat bed without using bedrails?: A Lot Help needed moving from lying on your back to sitting on the side of a flat bed without using bedrails?: Total Help needed moving to and from a bed to a chair (including a wheelchair)?: Total Help needed standing up from a chair using your arms (e.g., wheelchair or bedside chair)?: Total Help needed to walk in hospital room?: Total Help needed climbing 3-5 steps with a railing? : Total 6 Click Score: 7    End of Session   Activity Tolerance: Patient tolerated treatment well Patient left: in bed;with call bell/phone within reach Nurse Communication: Mobility status;Need for lift equipment PT Visit Diagnosis: Muscle weakness (generalized) (M62.81);Difficulty in walking, not elsewhere classified (R26.2);Other abnormalities of gait and mobility (R26.89)     Time: 9509-3267 PT Time Calculation (min) (ACUTE ONLY): 25 min  Charges:  $Therapeutic Exercise: 8-22 mins $Therapeutic Activity: 8-22 mins                     Bayard Males, PT Acute Rehabilitation Services Office: [place] 05/16/2022, 11:19 AM

## 2022-05-16 NOTE — TOC CM/SW Note (Addendum)
Received call back from [facility], rep Angie requested clinicals and demographic sheet to review for placement. They currently do not have a female bed but will have beds soon.    HF TOC CM updated attending, pt is not a candidate for LTAC due to payor coverage. Medicaid does not have LTAC benefit, but will cover Vent SNF. [facility]. Left message for return call. [place], Heart Failure TOC CM [phone]

## 2022-05-16 NOTE — Progress Notes (Signed)
Pt placed back on full vent support due to tachypnea and increased WOB. Pt tolerating well, RN aware, RT will monitor.

## 2022-05-16 NOTE — Progress Notes (Signed)
NAME:  Patricia [name], MRN:  [id number], DOB:  1970/11/18, LOS: 27 ADMISSION DATE:  04/23/2022, CONSULTATION DATE:  04/29/2022 REFERRING MD:  Felicie Morn, MD, CHIEF COMPLAINT:   Small bowel obstruction, aspiration event, sepsis, respiratory failure    History of Present Illness:  Ms. Patricia [name] with above hx and nuc study in 2015 neg for ischemia done for chest pain and echo with EF 60-65% presented to hospital for excisional hernia repair (robotic hernia repair 04/23/22) with mesh, ileus post op on TNA. Developed vomiting and hypotension after NG came out on 04/29/22 and PNA on CXR, was tachycardic and BP improved with IV fluids. DX with sepsis and ABX started. At one point prolonged Qtc. Pt admitted to ICU on 10/2.  Was placed on levophed. Early AM on 10/3 was intubated for worsening metabolic/lactic acidosis. She had AKI as well. Crt was 6.01, her normal 0.9. Today Cr is 4 on CRRT.  Pertinent  Medical History  DM2 Depression OSA Migraines  Asthma Obesity Schizophrenia HTN HLD  Significant Hospital Events: Including procedures, antibiotic start and stop dates in addition to other pertinent events   9/26 Robotic hernia repair complicated by abd adhesions.  10/2 CT + for developing SBO and bilat lower lobe opacities most likely aspiration PNA. Progressive decompensation requiring transfer to ICU 10/2 PCCM consulted for respiratory failure & shock 10\3 Intubated for resp failure. CVL and arterial line placed. 04/30/22: EF 30-35%, RV moderately reduced, mild to moderate MR, IVC collapsed throughout respiratory cycle 10\4 EKG changes/trop increased--> and taken to cath lab - No obstructive CAD, but in decompensated heart failure, Bedside echo with EF 15%, new WMA, moderate RV dysfunction, trivial pericardial effusion.underwent emergent L/RHC. Normal coronaries, RA mean 13, PA mean 29, LVEDP 16, PCWP 19, CO/CI 3.96/1.87   returned to CICU post-cath. Right heart cath placed right fem.  Right IJ HD cath placed.  10/5 advanced HF team following, shock supported on norepi/vasopressin and Dobutamine. On CRRT.  10/6 weaning Norepi. Starting to wean fent. Needing restraints.  10/8: Reintubated due to neuromuscular weakness.  Bedside echo demonstrating improved RV function, LV function remained depressed 10/9 CT abdomen pelvis.suggesting wall thickening throughout the proximal and mid small bowel consistent with possible infectious or inflammatory enteritis there is also bibasilar airspace disease which could reflect pneumonia.  Antibiotic coverage widened.  Zosyn changed to meropenem.  Tolerating trickle feeds still on TPN 10/10 stopped stress dose steroids.  Resumed subcutaneous heparin. 10/13 albumin for coox decrease. Weaning vent . Failed SBT. At goal TF. 10/14 extubated to bipap failed due to weakness, trached 10/15 trial off CRRT, lasix  Interim History / Subjective:   Condition unchanged today tolerated 2 hours trach collar yesterday  Objective   Blood pressure 119/68, pulse (!) 119, temperature 99.2 F (37.3 C), temperature source Oral, resp. rate (!) 31, height '5\' 4"'$  (1.626 m), weight 110.6 kg, last menstrual period 07/07/2014, SpO2 98 %.    Vent Mode: PSV;CPAP FiO2 (%):  [30 %-100 %] 35 % Set Rate:  [20 bmp] 20 bmp Vt Set:  [460 mL] 460 mL PEEP:  [5 cmH20] 5 cmH20 Pressure Support:  [5 cmH20] 5 cmH20 Plateau Pressure:  [13 cmH20] 13 cmH20   Intake/Output Summary (Last 24 hours) at 05/16/2022 1131 Last data filed at 05/16/2022 1100 Gross per 24 hour  Intake 1440.08 ml  Output 3100 ml  Net -1659.92 ml    Filed Weights   05/15/22 0500 05/15/22 1400 05/16/22 0500  Weight: 115.5 kg 115.5 kg 110.6  kg    Examination: General: chronically ill appearing woman lying in bed in NAD HEENT: sore on upper lip- not currently bleeding. [place]/AT  Neck: trach in place with small amount of bleeding inferiorly Pulmonary: Breathing comfortably on . Minimal bloody secretions.   Chest clear. Cardiac: S1S2, RRR Abdomen: obese, soft, NT Extremities: pedal edema, worse hand edema today Neuro: RASS 0, moving all extremities    Ancillary tests personally reviewed:  Creatinine 5 1 postdialysis HB: 7.7 Albumin: 1.6  Assessment & Plan:   Acute metabolic encephalopathy superimposed on hx bipolar disorder, schizophrenia   ICU delirium- improved now off all IV infusions. -Continue enteral sedatives at current dose for 48 hours before beginning taper. -On scheduled hydromorphone taper.  Acute biventricular HFrEF (Etiology sepsis, stress induced CM. Has clean coronaries) Cardiogenic shock resolved, EF improved.  -Monitor clinically  Acute hypoxic respiratory failure Aspiration PNA Pulm edema   RLL VAP  -meropenem x 10 days -con't weaning efforts - start progressive trach collar trials: 2 hours of twice daily increasing interval by 30 to 60 minutes every 48 hours. -Routine trach care; can remove sutures on 10/21. SLP consulted.  AKI with oliguria; no baseline CKD -Thrice a week hemodialysis per nephrology.  S/p hernia repair S/p LOA Ileus  inflammatory enteritis  -con't TF at goal -meropenem x 10 days -appreciate surgery's management  Anemia, acute on chronic  -transfuse 1 unit this morning for Hb <7  -monitor for signs of bleeding - none so far.   DM2 with hyperglycemia  -SSI PRN -drop TF coverage to 4 units q4h since TPN stopping -goal BG 140-180  Severe protein calorie malnutrition  -con't TF at goal  Pressure injury- lip -having trach in place helps relieve pressure  -wound care  Deconditioning -PT, OT, SLP  Best Practice (right click and "Reselect all SmartList Selections" daily)   Diet/type: tubefeeds DVT prophylaxis: prophylactic heparin  GI prophylaxis: PPI Lines: Central line and Dialysis Catheter Foley:  Yes, and it is still needed Code Status:  full code   Patricia Brood, MD [facility] ICU Physician [place]   Pager: [phone] Mobile: [phone] After hours: [phone].

## 2022-05-16 NOTE — Progress Notes (Signed)
Pharmacy Antibiotic Note  Patricia [name] is a 51 y.o. female admitted on 04/23/2022 with sepsis.  Pharmacy has been consulted for meropenem dosing.  Pt presented for hernia repair, now with resolved cardiogenic and septic shock s/p inotropes and vasopressors. Pt remains on mechanical ventilation s/p trach on 10/14. Pt has acute renal failure requiring CRRT, however CRRT stopped today (10/15). WBC has decreased to WNL on meropenem after stopping steroids and pt remains afebrile. Pt has now transitioned to iHD. Today is the last day of meropenem.  Plan: Adjust meropenem to 500 mg q24h to end today  Height: '5\' 4"'$  (162.6 cm) Weight: 115.5 kg (254 lb 10.1 oz) IBW/kg (Calculated) : 54.7  Temp (24hrs), Avg:99.1 F (37.3 C), Min:98.2 F (36.8 C), Max:100.9 F (38.3 C)  Recent Labs  Lab 05/13/22 0314 05/13/22 0930 05/13/22 1739 05/14/22 0520 05/15/22 0500 05/15/22 1333 05/16/22 0407  WBC 12.9* 13.4*  --  8.8 6.7  --  6.2  CREATININE 4.32*  --  3.25* 4.19* 5.96* 5.61* 4.51*     Estimated Creatinine Clearance: 18.2 mL/min (A) (by C-G formula based on SCr of 4.51 mg/dL (H)).    Allergies  Allergen Reactions   Iodinated Contrast Media Shortness Of Breath, Other (See Comments) and Cough    Throat itching    Peanut-Containing Drug Products Anaphylaxis    Peanut Oil Only.  Can eat peanuts with no problems   Oxycodone-Acetaminophen Itching    Antimicrobials this admission: Zosyn 10/4 >> 10/9  Vanc 10/6 >> 10/10 Meropenem 10/9 >>10/19   Microbiology results: 10/2 BCx: NG 10/3 BCxL NG 10/2 MRSA PCR: neg 10/9 Sputum cx: NG 10/14 Resp cx: NGTD  Thank you for allowing pharmacy to participate in this patient's care.  Reatha Harps, PharmD PGY2 Pharmacy Resident 05/16/2022 5:23 AM Check [email or web address] for unit specific pharmacy number

## 2022-05-16 NOTE — Progress Notes (Signed)
Speech Language Pathology Treatment: Patricia [name] Speaking valve  Patient Details Name: Patricia [name] MRN: [id number] DOB: [birth year] Today's Date: 05/16/2022 Time: 8675-4492 SLP Time Calculation (min) (ACUTE ONLY): 8 min  Assessment / Plan / Recommendation Clinical Impression  Pt was seen briefly, reporting 10/10 abdominal pain with RN present and aware, and providing medication upon SLP arrival. Pt was agreeable to PMV placement primarily to better describe her pain. PMV was donned with pt expectorating secretions orally when cued to do so. There was a lot removed via yankauer, most of which was blood tinged. Phonation achieved was very low, dysphonic. Intelligibility is impacted but she gravitates toward using gestures anyway, which does help with overall communication. Encouraged pt to continue to use PMV with staff as able, even if only for brief moments like this to facilitate communication. PMV doffed but cuff left deflated upon SLP departure; RN aware.   HPI HPI: Patricia [name] is a 51 y.o. female who presented to hospital for excisional hernia repair (robotic hernia repair 04/23/22) with mesh, ileus post op on TNA. Developed vomiting and hypotension after NG came out on 04/29/22 and PNA on CXR, was tachycardic and BP improved with IV fluids. DX with sepsis and ABX started. Pt admitted to ICU on 10/2.  Was placed on levophed. Early AM on 10/3 was intubated for worsening metabolic/lactic acidosis. 10/4 cardiac cath with EF 15%; She had AKI as well. Started CRRT. Extubation 10/14 failed; underwent tracheostomy. TC trials started 10/16.      SLP Plan  Continue with current plan of care      Recommendations for follow up therapy are one component of a multi-disciplinary discharge planning process, led by the attending physician.  Recommendations may be updated based on patient status, additional functional criteria and insurance authorization.    Recommendations         Patient may use  Passy-Muir Speech Valve: Intermittently with supervision PMSV Supervision: Full         Oral Care Recommendations: Oral care QID Follow Up Recommendations: Other (comment) Assistance recommended at discharge: None SLP Visit Diagnosis: Aphonia (R49.1) Plan: Continue with current plan of care           Patricia [name]., M.A. [place] Office [phone]  Secure chat preferred   05/16/2022, 10:20 AM

## 2022-05-17 DIAGNOSIS — I5021 Acute systolic (congestive) heart failure: Secondary | ICD-10-CM | POA: Diagnosis not present

## 2022-05-17 DIAGNOSIS — J9601 Acute respiratory failure with hypoxia: Secondary | ICD-10-CM | POA: Diagnosis not present

## 2022-05-17 DIAGNOSIS — J690 Pneumonitis due to inhalation of food and vomit: Secondary | ICD-10-CM | POA: Diagnosis not present

## 2022-05-17 DIAGNOSIS — A419 Sepsis, unspecified organism: Secondary | ICD-10-CM | POA: Diagnosis not present

## 2022-05-17 LAB — CBC
HCT: 21.4 % — ABNORMAL LOW (ref 36.0–46.0)
Hemoglobin: 6.8 g/dL — CL (ref 12.0–15.0)
MCH: 27.3 pg (ref 26.0–34.0)
MCHC: 31.8 g/dL (ref 30.0–36.0)
MCV: 85.9 fL (ref 80.0–100.0)
Platelets: 269 10*3/uL (ref 150–400)
RBC: 2.49 MIL/uL — ABNORMAL LOW (ref 3.87–5.11)
RDW: 15.9 % — ABNORMAL HIGH (ref 11.5–15.5)
WBC: 5.8 10*3/uL (ref 4.0–10.5)
nRBC: 0 % (ref 0.0–0.2)

## 2022-05-17 LAB — GLUCOSE, CAPILLARY
Glucose-Capillary: 116 mg/dL — ABNORMAL HIGH (ref 70–99)
Glucose-Capillary: 121 mg/dL — ABNORMAL HIGH (ref 70–99)
Glucose-Capillary: 122 mg/dL — ABNORMAL HIGH (ref 70–99)
Glucose-Capillary: 129 mg/dL — ABNORMAL HIGH (ref 70–99)
Glucose-Capillary: 138 mg/dL — ABNORMAL HIGH (ref 70–99)
Glucose-Capillary: 95 mg/dL (ref 70–99)

## 2022-05-17 LAB — RENAL FUNCTION PANEL
Albumin: 1.6 g/dL — ABNORMAL LOW (ref 3.5–5.0)
Albumin: 1.8 g/dL — ABNORMAL LOW (ref 3.5–5.0)
Anion gap: 15 (ref 5–15)
Anion gap: 11 (ref 5–15)
BUN: 120 mg/dL — ABNORMAL HIGH (ref 6–20)
BUN: 122 mg/dL — ABNORMAL HIGH (ref 6–20)
CO2: 22 mmol/L (ref 22–32)
CO2: 25 mmol/L (ref 22–32)
Calcium: 8.1 mg/dL — ABNORMAL LOW (ref 8.9–10.3)
Calcium: 8.4 mg/dL — ABNORMAL LOW (ref 8.9–10.3)
Chloride: 97 mmol/L — ABNORMAL LOW (ref 98–111)
Chloride: 96 mmol/L — ABNORMAL LOW (ref 98–111)
Creatinine, Ser: 6.61 mg/dL — ABNORMAL HIGH (ref 0.44–1.00)
Creatinine, Ser: 6.16 mg/dL — ABNORMAL HIGH (ref 0.44–1.00)
GFR, Estimated: 7 mL/min — ABNORMAL LOW (ref >60)
GFR, Estimated: 8 mL/min — ABNORMAL LOW (ref >60)
Glucose, Bld: 127 mg/dL — ABNORMAL HIGH (ref 70–99)
Glucose, Bld: 112 mg/dL — ABNORMAL HIGH (ref 70–99)
Phosphorus: 7.9 mg/dL — ABNORMAL HIGH (ref 2.5–4.6)
Phosphorus: 6.7 mg/dL — ABNORMAL HIGH (ref 2.5–4.6)
Potassium: 5.8 mmol/L — ABNORMAL HIGH (ref 3.5–5.1)
Potassium: 5.3 mmol/L — ABNORMAL HIGH (ref 3.5–5.1)
Sodium: 134 mmol/L — ABNORMAL LOW (ref 135–145)
Sodium: 132 mmol/L — ABNORMAL LOW (ref 135–145)

## 2022-05-17 LAB — PREPARE RBC (CROSSMATCH)

## 2022-05-17 LAB — PROTIME-INR
INR: 1.1 (ref 0.8–1.2)
Prothrombin Time: 14.3 seconds (ref 11.4–15.2)

## 2022-05-17 LAB — MAGNESIUM: Magnesium: 2.2 mg/dL (ref 1.7–2.4)

## 2022-05-17 LAB — CERULOPLASMIN: Ceruloplasmin: 10.8 mg/dL — ABNORMAL LOW (ref 19.0–39.0)

## 2022-05-17 MED ORDER — LIDOCAINE HCL 1 % IJ SOLN
INTRAMUSCULAR | Status: AC
  Filled 2022-05-17: qty 20

## 2022-05-17 MED ORDER — "THROMBI-PAD 3""X3"" EX PADS"
1.0000 | MEDICATED_PAD | Freq: Once | CUTANEOUS | Status: AC
  Administered 2022-05-17: 1 via TOPICAL
  Filled 2022-05-17: qty 1

## 2022-05-17 MED ORDER — SODIUM CHLORIDE 0.9% IV SOLUTION: Freq: Once | INTRAVENOUS | Status: DC

## 2022-05-17 MED ORDER — HEPARIN SODIUM (PORCINE) 1000 UNIT/ML IJ SOLN
INTRAMUSCULAR | Status: AC
  Filled 2022-05-17: qty 4

## 2022-05-17 MED ORDER — ALBUMIN HUMAN 25 % IV SOLN
INTRAVENOUS | Status: AC
  Administered 2022-05-17: 25 g
  Filled 2022-05-17: qty 100

## 2022-05-17 MED ORDER — CLONAZEPAM 0.5 MG PO TABS
0.5000 mg | ORAL_TABLET | Freq: Every day | ORAL | Status: DC
  Administered 2022-05-18 – 2022-06-02 (×16): 0.5 mg
  Filled 2022-05-17 (×16): qty 1

## 2022-05-17 MED ORDER — BACITRACIN-NEOMYCIN-POLYMYXIN OINTMENT TUBE
TOPICAL_OINTMENT | Freq: Two times a day (BID) | CUTANEOUS | Status: AC
  Administered 2022-05-20 – 2022-05-22 (×3): 1 via TOPICAL
  Filled 2022-05-17 (×3): qty 14

## 2022-05-17 NOTE — Progress Notes (Signed)
PT Cancellation Note  Patient Details Name: Patricia [name] MRN: [id number] DOB: 11-20-1970   Cancelled Treatment:    Reason Eval/Treat Not Completed: Patient not medically ready (pt with bleeding from HD site, awaiting blood and RN request hold at this time)   [place] 05/17/2022, 9:28 AM [place] Office: [phone]

## 2022-05-17 NOTE — Progress Notes (Signed)
NAME:  Patricia [name], MRN:  [id number], DOB:  1970-10-28, LOS: 30 ADMISSION DATE:  04/23/2022, CONSULTATION DATE:  04/29/2022 REFERRING MD:  Felicie Morn, MD, CHIEF COMPLAINT:   Small bowel obstruction, aspiration event, sepsis, respiratory failure    History of Present Illness:  Patricia [name] with above hx and nuc study in 2015 neg for ischemia done for chest pain and echo with EF 60-65% presented to hospital for excisional hernia repair (robotic hernia repair 04/23/22) with mesh, ileus post op on TNA. Developed vomiting and hypotension after NG came out on 04/29/22 and PNA on CXR, was tachycardic and BP improved with IV fluids. DX with sepsis and ABX started. At one point prolonged Qtc. Pt admitted to ICU on 10/2.  Was placed on levophed. Early AM on 10/3 was intubated for worsening metabolic/lactic acidosis. She had AKI as well. Crt was 6.01, her normal 0.9. Today Cr is 4 on CRRT.  Pertinent  Medical History  DM2 Depression OSA Migraines  Asthma Obesity Schizophrenia HTN HLD  Significant Hospital Events: Including procedures, antibiotic start and stop dates in addition to other pertinent events   9/26 Robotic hernia repair complicated by abd adhesions.  10/2 CT + for developing SBO and bilat lower lobe opacities most likely aspiration PNA. Progressive decompensation requiring transfer to ICU 10/2 PCCM consulted for respiratory failure & shock 10\3 Intubated for resp failure. CVL and arterial line placed. 04/30/22: EF 30-35%, RV moderately reduced, mild to moderate MR, IVC collapsed throughout respiratory cycle 10\4 EKG changes/trop increased--> and taken to cath lab - No obstructive CAD, but in decompensated heart failure, Bedside echo with EF 15%, new WMA, moderate RV dysfunction, trivial pericardial effusion.underwent emergent L/RHC. Normal coronaries, RA mean 13, PA mean 29, LVEDP 16, PCWP 19, CO/CI 3.96/1.87   returned to CICU post-cath. Right heart cath placed right fem.  Right IJ HD cath placed.  10/5 advanced HF team following, shock supported on norepi/vasopressin and Dobutamine. On CRRT.  10/6 weaning Norepi. Starting to wean fent. Needing restraints.  10/8: Reintubated due to neuromuscular weakness.  Bedside echo demonstrating improved RV function, LV function remained depressed 10/9 CT abdomen pelvis.suggesting wall thickening throughout the proximal and mid small bowel consistent with possible infectious or inflammatory enteritis there is also bibasilar airspace disease which could reflect pneumonia.  Antibiotic coverage widened.  Zosyn changed to meropenem.  Tolerating trickle feeds still on TPN 10/10 stopped stress dose steroids.  Resumed subcutaneous heparin. 10/13 albumin for coox decrease. Weaning vent . Failed SBT. At goal TF. 10/14 extubated to bipap failed due to weakness, trached 10/15 trial off CRRT, lasix  Interim History / Subjective:   Condition unchanged today tolerated 2 hours trach collar yesterday  Objective   Blood pressure 119/68, pulse (!) 119, temperature 99.2 F (37.3 C), temperature source Oral, resp. rate (!) 31, height '5\' 4"'$  (1.626 m), weight 110.6 kg, last menstrual period 07/07/2014, SpO2 98 %.    Vent Mode: PSV;CPAP FiO2 (%):  [30 %-100 %] 35 % Set Rate:  [20 bmp] 20 bmp Vt Set:  [460 mL] 460 mL PEEP:  [5 cmH20] 5 cmH20 Pressure Support:  [5 cmH20] 5 cmH20 Plateau Pressure:  [13 cmH20] 13 cmH20   Intake/Output Summary (Last 24 hours) at 05/16/2022 1131 Last data filed at 05/16/2022 1100 Gross per 24 hour  Intake 1440.08 ml  Output 3100 ml  Net -1659.92 ml    Filed Weights   05/15/22 0500 05/15/22 1400 05/16/22 0500  Weight: 115.5 kg 115.5 kg 110.6  kg    Examination: General 51 year old female patient resting in bed currently on aerosol trach collar with mild accessory use appreciated HEENT normocephalic, does have ulceration from prior endotracheal tube on left. Pulmonary diminished bases some scattered  rhonchi, mild accessory use with respiratory rate in the 30s Cardiac: Regular rate and rhythm Abdomen: Soft nontender Extremities: Warm dry brisk cap refill Neuro: Generalized weakness and deconditioning, appears appropriate  Resolved problems Ileus  Assessment & Plan:   Acute metabolic encephalopathy superimposed on hx bipolar disorder, schizophrenia   ICU delirium- improved now off all IV infusions. Plan Continue to taper dilaudid  Cont clonazepam 0.5 mg At HS (from bid) Cont seroquel Precedex is off  Acute hypoxic respiratory failure d/t Aspiration PNA, (RLL) and c/b Pulm edema   Plan ABX s below Cont ATC rated with mandatory at bedtime ventilation and as needed during day VAP bunde   Acute biventricular HFrEF (Etiology sepsis, stress induced CM. Has clean coronaries) Cardiogenic shock resolved, EF improved.  Plan Tele  AKI with oliguria; no baseline CKD Plan Continuing Monday/Wednesday/Friday dialysis schedule Avoid nephrotoxins Keep euvolemic Strict intake output A.m. chemistry  S/p hernia repair &  LOA inflammatory enteritis  Plan Continuing tube feeds Meropenem plan to complete after 10 days, should be done today Surgical care as directed by surgical team   Anemia, acute on chronic  -Hemoglobin down to 6.8 today Plan Continue to trend, evaluate for evidence of bleeding Transfuse today A.m. CBC   DM2 with hyperglycemia  Excellent glycemic control Plan continue sliding scale insulin Goal 140-180 No change in current tube feed coverage  Severe protein calorie malnutrition  Plan Cont TFs   Pressure injury- lip -having trach in place helps relieve pressure  Plan Routine wound care Added and antibiotic cream per family request  Deconditioning Plan Continue rehabilitation efforts with PT, OT, and rehab  Best Practice (right click and "Reselect all SmartList Selections" daily)   Diet/type: tubefeeds DVT prophylaxis: prophylactic heparin   GI prophylaxis: PPI Lines: Central line and Dialysis Catheter Foley:  Yes, and it is still needed Code Status:  full code  My time 34 min Erick Colace ACNP-BC [place] Pager # [phone] OR # [phone] if no answer

## 2022-05-17 NOTE — Progress Notes (Signed)
16 Days Post-Op   Subjective/Chief Complaint: Still complaining of abdominal pain.  No bowel movement via rectal tube.  Some oozing around dialysis catheter.  Objective: Vital signs in last 24 hours: Temp:  [98.8 F (37.1 C)-100.4 F (38 C)] 100.4 F (38 C) (10/20 0000) Pulse Rate:  [113-127] 116 (10/20 0348) Resp:  [22-35] 27 (10/20 0200) BP: (98-125)/(60-101) 110/67 (10/20 0200) SpO2:  [90 %-100 %] 94 % (10/20 0200) FiO2 (%):  [30 %-35 %] 30 % (10/20 0348) Last BM Date : 05/16/22  Intake/Output from previous day: 10/19 0701 - 10/20 0700 In: 850 [I.V.:10; NG/GT:840] Out: 200 [Emesis/NG output:200] Intake/Output this shift: No intake/output data recorded.  Exam: Sitting up in bed Trach, on vent Follows commands Abdomen soft, obese, mild TTP. Incisions c/d/i  Lab Results:  Recent Labs    05/16/22 0407 05/17/22 0459  WBC 6.2 5.8  HGB 7.7* 6.8*  HCT 23.1* 21.4*  PLT 271 269    BMET Recent Labs    05/16/22 1655 05/17/22 0459  NA 134* 134*  K 5.2* 5.8*  CL 97* 97*  CO2 25 22  GLUCOSE 132* 127*  BUN 101* 120*  CREATININE 5.49* 6.61*  CALCIUM 8.2* 8.1*    PT/INR Recent Labs    05/17/22 0617  LABPROT 14.3  INR 1.1   ABG No results for input(s): "PHART", "HCO3" in the last 72 hours.  Invalid input(s): "PCO2", "PO2"   Studies/Results: IR Fluoro Guide CV Line Right  Result Date: 05/15/2022 INDICATION: End-stage renal disease. In need of durable intravenous access for hemodialysis. EXAM: TUNNELED CENTRAL VENOUS HEMODIALYSIS CATHETER PLACEMENT WITH ULTRASOUND AND FLUOROSCOPIC GUIDANCE MEDICATIONS: Ancef 2 gm IV. The antibiotic was given in an appropriate time interval prior to skin puncture. ANESTHESIA/SEDATION: Moderate (conscious) sedation was employed during this procedure. A total of Fentanyl 50 mcg was administered intravenously. Moderate Sedation Time: 15 minutes. The patient's level of consciousness and vital signs were monitored continuously by  radiology nursing throughout the procedure under my direct supervision. FLUOROSCOPY TIME:  36 seconds (8 mGy) COMPLICATIONS: None immediate. PROCEDURE: Informed written consent was obtained from the patient after a discussion of the risks, benefits, and alternatives to treatment. Questions regarding the procedure were encouraged and answered. The right neck and chest were prepped with chlorhexidine in a sterile fashion, and a sterile drape was applied covering the operative field. Maximum barrier sterile technique with sterile gowns and gloves were used for the procedure. A timeout was performed prior to the initiation of the procedure. After creating a small venotomy incision, a micropuncture kit was utilized to access the internal jugular vein. Real-time ultrasound guidance was utilized for vascular access including the acquisition of a permanent ultrasound image documenting patency of the accessed vessel. The microwire was utilized to measure appropriate catheter length. A stiff Glidewire was advanced to the level of the IVC and the micropuncture sheath was exchanged for a peel-away sheath. A palindrome tunneled hemodialysis catheter measuring 19 cm from tip to cuff was tunneled in a retrograde fashion from the anterior chest wall to the venotomy incision. The catheter was then placed through the peel-away sheath with tips ultimately positioned within the superior aspect of the right atrium. Final catheter positioning was confirmed and documented with a spot radiographic image. The catheter aspirates and flushes normally. The catheter was flushed with appropriate volume heparin dwells. The catheter exit site was secured with a 0-Prolene retention suture. The venotomy incision was closed with Dermabond and Steri-strips. Dressings were applied. The patient tolerated the  procedure well without immediate post procedural complication. IMPRESSION: Successful placement of 19 cm tip to cuff tunneled hemodialysis catheter  via the right internal jugular vein with tips terminating within the superior aspect of the right atrium. The catheter is ready for immediate use. Electronically Signed   By: Sandi Mariscal M.D.   On: 05/15/2022 12:18   IR US Guide Vasc Access Right  Result Date: 05/15/2022 INDICATION: End-stage renal disease. In need of durable intravenous access for hemodialysis. EXAM: TUNNELED CENTRAL VENOUS HEMODIALYSIS CATHETER PLACEMENT WITH ULTRASOUND AND FLUOROSCOPIC GUIDANCE MEDICATIONS: Ancef 2 gm IV. The antibiotic was given in an appropriate time interval prior to skin puncture. ANESTHESIA/SEDATION: Moderate (conscious) sedation was employed during this procedure. A total of Fentanyl 50 mcg was administered intravenously. Moderate Sedation Time: 15 minutes. The patient's level of consciousness and vital signs were monitored continuously by radiology nursing throughout the procedure under my direct supervision. FLUOROSCOPY TIME:  36 seconds (8 mGy) COMPLICATIONS: None immediate. PROCEDURE: Informed written consent was obtained from the patient after a discussion of the risks, benefits, and alternatives to treatment. Questions regarding the procedure were encouraged and answered. The right neck and chest were prepped with chlorhexidine in a sterile fashion, and a sterile drape was applied covering the operative field. Maximum barrier sterile technique with sterile gowns and gloves were used for the procedure. A timeout was performed prior to the initiation of the procedure. After creating a small venotomy incision, a micropuncture kit was utilized to access the internal jugular vein. Real-time ultrasound guidance was utilized for vascular access including the acquisition of a permanent ultrasound image documenting patency of the accessed vessel. The microwire was utilized to measure appropriate catheter length. A stiff Glidewire was advanced to the level of the IVC and the micropuncture sheath was exchanged for a  peel-away sheath. A palindrome tunneled hemodialysis catheter measuring 19 cm from tip to cuff was tunneled in a retrograde fashion from the anterior chest wall to the venotomy incision. The catheter was then placed through the peel-away sheath with tips ultimately positioned within the superior aspect of the right atrium. Final catheter positioning was confirmed and documented with a spot radiographic image. The catheter aspirates and flushes normally. The catheter was flushed with appropriate volume heparin dwells. The catheter exit site was secured with a 0-Prolene retention suture. The venotomy incision was closed with Dermabond and Steri-strips. Dressings were applied. The patient tolerated the procedure well without immediate post procedural complication. IMPRESSION: Successful placement of 19 cm tip to cuff tunneled hemodialysis catheter via the right internal jugular vein with tips terminating within the superior aspect of the right atrium. The catheter is ready for immediate use. Electronically Signed   By: Sandi Mariscal M.D.   On: 05/15/2022 12:18    Anti-infectives: Anti-infectives (From admission, onward)    Start     Dose/Rate Route Frequency Ordered Stop   05/15/22 1445  ceFAZolin (ANCEF) IVPB 2g/100 mL premix       Note to Pharmacy: To be given in IR   2 g 200 mL/hr over 30 Minutes Intravenous  Once 05/15/22 1352 05/15/22 1616   05/15/22 1230  ceFAZolin (ANCEF) powder 1 g  Status:  Discontinued        1 g Other To Surgery 05/15/22 1140 05/15/22 1239   05/15/22 1230  ceFAZolin (ANCEF) IVPB 2g/100 mL premix        2 g 200 mL/hr over 30 Minutes Intravenous To Radiology 05/15/22 1140 05/15/22 1140   05/15/22 0000  ceFAZolin (ANCEF) IVPB 2g/100 mL premix        2 g 200 mL/hr over 30 Minutes Intravenous To Radiology 05/14/22 1308 05/15/22 0005   05/13/22 2134  meropenem (MERREM) 500 mg in sodium chloride 0.9 % 100 mL IVPB        500 mg 200 mL/hr over 30 Minutes Intravenous Every 24 hours  05/13/22 0815 05/17/22 0140   05/13/22 1000  meropenem (MERREM) 500 mg in sodium chloride 0.9 % 100 mL IVPB  Status:  Discontinued        500 mg 200 mL/hr over 30 Minutes Intravenous Every 12 hours 05/12/22 0940 05/12/22 0946   05/12/22 2200  meropenem (MERREM) 500 mg in sodium chloride 0.9 % 100 mL IVPB  Status:  Discontinued        500 mg 200 mL/hr over 30 Minutes Intravenous Every 12 hours 05/12/22 0946 05/13/22 0815   05/12/22 2000  meropenem (MERREM) 1 g in sodium chloride 0.9 % 100 mL IVPB  Status:  Discontinued        1 g 200 mL/hr over 30 Minutes Intravenous  Once 05/12/22 0940 05/12/22 0946   05/06/22 1530  meropenem (MERREM) 1 g in sodium chloride 0.9 % 100 mL IVPB  Status:  Discontinued        1 g 200 mL/hr over 30 Minutes Intravenous Every 8 hours 05/06/22 1438 05/12/22 0940   05/04/22 1000  vancomycin (VANCOCIN) IVPB 1000 mg/200 mL premix  Status:  Discontinued        1,000 mg 200 mL/hr over 60 Minutes Intravenous Every 24 hours 05/03/22 0802 05/08/22 0913   05/03/22 0845  vancomycin (VANCOREADY) IVPB 2000 mg/400 mL        2,000 mg 200 mL/hr over 120 Minutes Intravenous  Once 05/03/22 0751 05/03/22 1114   05/01/22 1800  piperacillin-tazobactam (ZOSYN) IVPB 3.375 g  Status:  Discontinued        3.375 g 12.5 mL/hr over 240 Minutes Intravenous Every 6 hours 05/01/22 1426 05/01/22 1427   05/01/22 1515  piperacillin-tazobactam (ZOSYN) IVPB 3.375 g  Status:  Discontinued        3.375 g 100 mL/hr over 30 Minutes Intravenous Every 6 hours 05/01/22 1428 05/06/22 1438   04/30/22 1400  piperacillin-tazobactam (ZOSYN) IVPB 2.25 g  Status:  Discontinued        2.25 g 100 mL/hr over 30 Minutes Intravenous Every 8 hours 04/30/22 1325 05/01/22 1426   04/29/22 1530  piperacillin-tazobactam (ZOSYN) IVPB 3.375 g  Status:  Discontinued        3.375 g 12.5 mL/hr over 240 Minutes Intravenous Every 8 hours 04/29/22 1433 04/30/22 1325   04/29/22 1145  metroNIDAZOLE (FLAGYL) IVPB 500 mg  Status:   Discontinued        500 mg 100 mL/hr over 60 Minutes Intravenous Every 12 hours 04/29/22 1047 04/29/22 1433   04/29/22 1145  vancomycin (VANCOCIN) IVPB 1000 mg/200 mL premix  Status:  Discontinued        1,000 mg 200 mL/hr over 60 Minutes Intravenous  Once 04/29/22 1047 04/29/22 1055   04/29/22 1145  vancomycin (VANCOREADY) IVPB 1500 mg/300 mL  Status:  Discontinued        1,500 mg 150 mL/hr over 120 Minutes Intravenous Every 24 hours 04/29/22 1055 04/29/22 1357   04/29/22 1045  ceFEPIme (MAXIPIME) 2 g in sodium chloride 0.9 % 100 mL IVPB  Status:  Discontinued        2 g 200 mL/hr over 30 Minutes Intravenous Every  8 hours 04/29/22 0954 04/29/22 1433   04/23/22 0730  ceFAZolin (ANCEF) IVPB 2g/100 mL premix        2 g 200 mL/hr over 30 Minutes Intravenous On call to O.R. 04/23/22 0726 04/23/22 3524       Assessment/Plan: Ms. Echeverry is s/p robotic incisional hernia repair on 04/23/22 -Postop ileus complicated by aspiration: Ileus resolved, having bowel function.  Tolerating tube feeds at goal - Inflammatory enteritis likely due to ischemic insult with dilated bowel at time of high pressor support - Abdomen fairly benign on exam. - WBC normalized - Remove rectal tube, not functioning well   Acute metabolic encephalopathy superimposed on hx bipolar disorder, schizophrenia   ICU delirium- improved now off all IV infusions. Acute biventricular HFrEF (Etiology sepsis, stress induced CM. Has clean coronaries) Cardiogenic shock resolved, EF improved.  Acute hypoxic respiratory failure Aspiration PNA Pulm edema   RLL VAP  AKI with oliguria; no baseline CKD  Anemia, acute on chronic  DM2 with hyperglycemia  Severe protein calorie malnutrition  Pressure injury lip - improving with local care and trach Deconditioning   [place] with multiple issues  Dispo - CM consult   Felicie Morn, [facility] 05/17/2022, 7:40 AM Please see Amion for pager  number during day hours 7:00am-4:30pm

## 2022-05-17 NOTE — Progress Notes (Signed)
eLink Physician-Brief Progress Note Patient Name: Patricia [name] DOB: 04-Jan-1971 MRN: [id number]   Date of Service  05/17/2022  HPI/Events of Note  RN states pt had flexi removed today because there was no output, however, when day shift RN went to remove it she discovered it was not in place.  Removed anyway.  Has had several large watery stools since then per RN  eICU Interventions  Flexiseal re-ordered     Intervention Category Intermediate Interventions: Other:  Judd Lien 05/17/2022, 10:54 PM

## 2022-05-17 NOTE — Progress Notes (Signed)
Patient ID: Patricia [name], female   DOB: 1971-04-20, 51 y.o.   MRN: [id number] S: Resting in bed with no complaints O:BP 127/70   Pulse (!) 105   Temp 98.9 F (37.2 C) (Oral)   Resp (!) 26   Ht _0  (1.626 m)   Wt 110.1 kg   LMP 07/07/2014 (Approximate)   SpO2 98%   BMI 41.66 kg/m   Intake/Output Summary (Last 24 hours) at 05/17/2022 0920 Last data filed at 05/17/2022 0915 Gross per 24 hour  Intake 720 ml  Output 200 ml  Net 520 ml   Intake/Output: I/O last 3 completed shifts: In: 1500.1 [I.V.:10; NG/GT:1390; IV Piggyback:100.1] Out: 200 [Emesis/NG output:200]  Intake/Output this shift:  Total I/O In: 10 [I.V.:10] Out: -  Weight change: -5.4 kg Gen:on vent via trach, awake CVS: Tachycardia Resp:Ventilated, bilateral chest rise Abd: +BS, soft, NT Ext: Trace edema in the bilateral hands with legs Neuro: awake  Recent Labs  Lab 05/13/22 1739 05/14/22 0520 05/15/22 0500 05/15/22 1333 05/16/22 0407 05/16/22 1655 05/17/22 0459  NA 134* 139 136 135 134* 134* 134*  K 4.1 4.4 5.0 4.6 4.7 5.2* 5.8*  CL 99 103 98 103 96* 97* 97*  CO2 24 22 21* 18* _1 GLUCOSE 126* 98 183* 220* 132* 132* 127*  BUN 59* 73* 112* 105* 73* 101* 120*  CREATININE 3.25* 4.19* 5.96* 5.61* 4.51* 5.49* 6.61*  ALBUMIN 1.8* <1.5* <1.5* <1.5* 1.6* 1.6* 1.6*  CALCIUM 7.6* 8.0* 8.4* 7.0* 8.2* 8.2* 8.1*  PHOS 5.6* 6.2* 7.8* 7.0* 6.6* 7.4* 7.9*   Liver Function Tests: Recent Labs  Lab 05/16/22 0407 05/16/22 1655 05/17/22 0459  ALBUMIN 1.6* 1.6* 1.6*   No results for input(s): "LIPASE", "AMYLASE" in the last 168 hours. No results for input(s): "AMMONIA" in the last 168 hours. CBC: Recent Labs  Lab 05/13/22 0930 05/14/22 0520 05/15/22 0500 05/16/22 0407 05/17/22 0459  WBC 13.4* 8.8 6.7 6.2 5.8  HGB 8.8* 7.0* 6.9* 7.7* 6.8*  HCT 26.4* 21.1* 20.9* 23.1* 21.4*  MCV 85.4 85.4 85.7 84.0 85.9  PLT 197 201 241 271 269   Cardiac Enzymes: No results for input(s): "CKTOTAL", "CKMB",  "CKMBINDEX", "TROPONINI" in the last 168 hours. CBG: Recent Labs  Lab 05/16/22 1551 05/16/22 1952 05/16/22 2359 05/17/22 0458 05/17/22 0747  GLUCAP 124* 145* 129* 122* 116*    Iron Studies:  Recent Labs    05/15/22 1333  FERRITIN 167   Studies/Results: IR Fluoro Guide CV Line Right  Result Date: 05/15/2022 INDICATION: End-stage renal disease. In need of durable intravenous access for hemodialysis. EXAM: TUNNELED CENTRAL VENOUS HEMODIALYSIS CATHETER PLACEMENT WITH ULTRASOUND AND FLUOROSCOPIC GUIDANCE MEDICATIONS: Ancef 2 gm IV. The antibiotic was given in an appropriate time interval prior to skin puncture. ANESTHESIA/SEDATION: Moderate (conscious) sedation was employed during this procedure. A total of Fentanyl 50 mcg was administered intravenously. Moderate Sedation Time: 15 minutes. The patient's level of consciousness and vital signs were monitored continuously by radiology nursing throughout the procedure under my direct supervision. FLUOROSCOPY TIME:  36 seconds (8 mGy) COMPLICATIONS: None immediate. PROCEDURE: Informed written consent was obtained from the patient after a discussion of the risks, benefits, and alternatives to treatment. Questions regarding the procedure were encouraged and answered. The right neck and chest were prepped with chlorhexidine in a sterile fashion, and a sterile drape was applied covering the operative field. Maximum barrier sterile technique with sterile gowns and gloves were used for the procedure. A timeout was performed prior to  the initiation of the procedure. After creating a small venotomy incision, a micropuncture kit was utilized to access the internal jugular vein. Real-time ultrasound guidance was utilized for vascular access including the acquisition of a permanent ultrasound image documenting patency of the accessed vessel. The microwire was utilized to measure appropriate catheter length. A stiff Glidewire was advanced to the level of the IVC and  the micropuncture sheath was exchanged for a peel-away sheath. A palindrome tunneled hemodialysis catheter measuring 19 cm from tip to cuff was tunneled in a retrograde fashion from the anterior chest wall to the venotomy incision. The catheter was then placed through the peel-away sheath with tips ultimately positioned within the superior aspect of the right atrium. Final catheter positioning was confirmed and documented with a spot radiographic image. The catheter aspirates and flushes normally. The catheter was flushed with appropriate volume heparin dwells. The catheter exit site was secured with a 0-Prolene retention suture. The venotomy incision was closed with Dermabond and Steri-strips. Dressings were applied. The patient tolerated the procedure well without immediate post procedural complication. IMPRESSION: Successful placement of 19 cm tip to cuff tunneled hemodialysis catheter via the right internal jugular vein with tips terminating within the superior aspect of the right atrium. The catheter is ready for immediate use. Electronically Signed   By: Sandi Mariscal M.D.   On: 05/15/2022 12:18   IR US Guide Vasc Access Right  Result Date: 05/15/2022 INDICATION: End-stage renal disease. In need of durable intravenous access for hemodialysis. EXAM: TUNNELED CENTRAL VENOUS HEMODIALYSIS CATHETER PLACEMENT WITH ULTRASOUND AND FLUOROSCOPIC GUIDANCE MEDICATIONS: Ancef 2 gm IV. The antibiotic was given in an appropriate time interval prior to skin puncture. ANESTHESIA/SEDATION: Moderate (conscious) sedation was employed during this procedure. A total of Fentanyl 50 mcg was administered intravenously. Moderate Sedation Time: 15 minutes. The patient's level of consciousness and vital signs were monitored continuously by radiology nursing throughout the procedure under my direct supervision. FLUOROSCOPY TIME:  36 seconds (8 mGy) COMPLICATIONS: None immediate. PROCEDURE: Informed written consent was obtained from the  patient after a discussion of the risks, benefits, and alternatives to treatment. Questions regarding the procedure were encouraged and answered. The right neck and chest were prepped with chlorhexidine in a sterile fashion, and a sterile drape was applied covering the operative field. Maximum barrier sterile technique with sterile gowns and gloves were used for the procedure. A timeout was performed prior to the initiation of the procedure. After creating a small venotomy incision, a micropuncture kit was utilized to access the internal jugular vein. Real-time ultrasound guidance was utilized for vascular access including the acquisition of a permanent ultrasound image documenting patency of the accessed vessel. The microwire was utilized to measure appropriate catheter length. A stiff Glidewire was advanced to the level of the IVC and the micropuncture sheath was exchanged for a peel-away sheath. A palindrome tunneled hemodialysis catheter measuring 19 cm from tip to cuff was tunneled in a retrograde fashion from the anterior chest wall to the venotomy incision. The catheter was then placed through the peel-away sheath with tips ultimately positioned within the superior aspect of the right atrium. Final catheter positioning was confirmed and documented with a spot radiographic image. The catheter aspirates and flushes normally. The catheter was flushed with appropriate volume heparin dwells. The catheter exit site was secured with a 0-Prolene retention suture. The venotomy incision was closed with Dermabond and Steri-strips. Dressings were applied. The patient tolerated the procedure well without immediate post procedural complication. IMPRESSION: Successful placement  of 19 cm tip to cuff tunneled hemodialysis catheter via the right internal jugular vein with tips terminating within the superior aspect of the right atrium. The catheter is ready for immediate use. Electronically Signed   By: Sandi Mariscal M.D.   On:  05/15/2022 12:18    sodium chloride   Intravenous Once   Chlorhexidine Gluconate Cloth  6 each Topical Daily   Chlorhexidine Gluconate Cloth  6 each Topical Q0600   clonazePAM  0.5 mg Per Tube BID   darbepoetin (ARANESP) injection - DIALYSIS  100 mcg Intravenous Q Fri-HD   feeding supplement (PROSource TF20)  60 mL Per Tube BID   fiber  1 packet Per Tube BID   heparin injection (subcutaneous)  5,000 Units Subcutaneous Q8H   HYDROmorphone HCl  1.5 mg Per Tube Q4H   Followed by   [START ON 05/19/2022] HYDROmorphone HCl  1 mg Per Tube Q4H   Followed by   [START ON 05/22/2022] HYDROmorphone HCl  0.5 mg Per Tube Q4H   insulin aspart  0-20 Units Subcutaneous Q4H   insulin aspart  4 Units Subcutaneous Q4H   multivitamin  1 tablet Per Tube QHS   nutrition supplement (JUVEN)  1 packet Oral BID BM   mouth rinse  15 mL Mouth Rinse Q2H   pantoprazole  40 mg Per Tube Daily   QUEtiapine  50 mg Per Tube BID   sodium chloride flush  10-40 mL Intracatheter Q12H    BMET    Component Value Date/Time   NA 134 (L) 05/17/2022 0459   NA 139 07/02/2014 1923   K 5.8 (H) 05/17/2022 0459   K 3.9 07/02/2014 1923   CL 97 (L) 05/17/2022 0459   CL 105 07/02/2014 1923   CO2 22 05/17/2022 0459   CO2 26 07/02/2014 1923   GLUCOSE 127 (H) 05/17/2022 0459   GLUCOSE 95 07/02/2014 1923   BUN 120 (H) 05/17/2022 0459   BUN 7 07/02/2014 1923   CREATININE 6.61 (H) 05/17/2022 0459   CREATININE 0.98 07/02/2014 1923   CALCIUM 8.1 (L) 05/17/2022 0459   CALCIUM 8.5 07/02/2014 1923   GFRNONAA 7 (L) 05/17/2022 0459   GFRNONAA >60 07/02/2014 1923   GFRAA >60 04/22/2018 2215   GFRAA >60 07/02/2014 1923   CBC    Component Value Date/Time   WBC 5.8 05/17/2022 0459   RBC 2.49 (L) 05/17/2022 0459   HGB 6.8 (LL) 05/17/2022 0459   HGB 9.4 (L) 07/02/2014 1923   HCT 21.4 (L) 05/17/2022 0459   HCT 30.3 (L) 07/02/2014 1923   PLT 269 05/17/2022 0459   PLT 449 (H) 07/02/2014 1923   MCV 85.9 05/17/2022 0459   MCV 71 (L)  07/02/2014 1923   MCH 27.3 05/17/2022 0459   MCHC 31.8 05/17/2022 0459   RDW 15.9 (H) 05/17/2022 0459   RDW 18.8 (H) 07/02/2014 1923   LYMPHSABS 2.0 05/07/2022 0428   MONOABS 2.4 (H) 05/07/2022 0428   EOSABS 0.0 05/07/2022 0428   BASOSABS 0.1 05/07/2022 0428   Assessment/Plan:   AKI - likely ATN in the setting of cardiogenic and septic shock. CRRT 10/4-10/14/23 via RIJ temp HD catheter. First IHD 10/16.  No signs of recovery at this time.  Failed diuretic challenge Will keep HD on MWF schedule. S/p [facility] 10/18 Avoid nephrotoxic medications including NSAIDs and iodinated intravenous contrast exposure unless the latter is absolutely indicated.  Preferred narcotic agents for pain control are hydromorphone, fentanyl, and methadone. Morphine should not be used. Avoid Baclofen and  avoid oral sodium phosphate and magnesium citrate based laxatives / bowel preps. Continue strict Input and Output monitoring. Will monitor the patient closely with you and intervene or adjust therapy as indicated by changes in clinical status/labs  Cardiogenic/septic shock - off pressors now.  Heart failure team had been following. Likely due to aspiration pneumonia. Acute hypoxic respiratory failure - was re-intubated 05/05/22.  Extubated again on 10/14 but failed BiPAP, s/p trach and back on Vent 05/11/22 per PCCM. TC trials per CCM Infectious enteritis - seen on CT scan.  Management per CCM Anemia of critical illness - transfuse for Hgb <7. Requiring PRBCs. Per primary. ESA started 10/18 Acute MI - normal coronaries by cath 10/4 Hyperkalemia: Dialysis today.  Consider Lokelma over the weekend Thrombocytopenia - improved/resolved S/p robotic incisional hernia repair with mesh and LOA - Surgery following.

## 2022-05-17 NOTE — Progress Notes (Signed)
Pt placed back on vent to rest until this afternoon, per MD. RT will monitor.

## 2022-05-17 NOTE — Progress Notes (Signed)
Assisted tele visit to patient with family member.  Sabrina Keough R Rayyan Burley, RN   

## 2022-05-17 NOTE — Progress Notes (Signed)
Pt placed on ATC 8l/35% and is tolerating well.

## 2022-05-17 NOTE — Progress Notes (Signed)
S/p tunneled HD catheter for hemodialysis access placed by IR on 10.18.23. Team reports oozing around the exit site. Purse string suture placed at bedside. Patient tolerated the procedure. IR will continue to follow for possible suture removal

## 2022-05-17 NOTE — Progress Notes (Signed)
eLink Physician-Brief Progress Note Patient Name: Patricia [name] DOB: [birth year] MRN: [id number]   Date of Service  05/17/2022  HPI/Events of Note  Hgb 6.8.  Bleeding from dialysis catheter site  eICU Interventions  Transfuse 1 unit prbc Thrombin pad to catheter site Check coags Hold pressure     Intervention Category Intermediate Interventions: Bleeding - evaluation and treatment with blood products  Mauri Brooklyn, P 05/17/2022, 6:13 AM

## 2022-05-17 NOTE — Progress Notes (Signed)
Received patient in bed to unit.  Alert and oriented.  Informed consent signed and in chart.   Treatment initiated: 1610 Treatment completed: 1940  Patient tolerated well.  without acute distress.  Hand-off given to patient's nurse.   Access used: catheter Access issues: none  Total UF removed: 3 L Medication(s) given: Albumin 25 g . Aranesp 100 mcg. Post HD VS: 119/79 P112 R 22 Post HD weight: 108 kg   [facility]

## 2022-05-18 DIAGNOSIS — J690 Pneumonitis due to inhalation of food and vomit: Secondary | ICD-10-CM | POA: Diagnosis not present

## 2022-05-18 DIAGNOSIS — I5021 Acute systolic (congestive) heart failure: Secondary | ICD-10-CM | POA: Diagnosis not present

## 2022-05-18 DIAGNOSIS — A419 Sepsis, unspecified organism: Secondary | ICD-10-CM | POA: Diagnosis not present

## 2022-05-18 DIAGNOSIS — J9601 Acute respiratory failure with hypoxia: Secondary | ICD-10-CM | POA: Diagnosis not present

## 2022-05-18 DIAGNOSIS — K469 Unspecified abdominal hernia without obstruction or gangrene: Secondary | ICD-10-CM

## 2022-05-18 LAB — GLUCOSE, CAPILLARY
Glucose-Capillary: 107 mg/dL — ABNORMAL HIGH (ref 70–99)
Glucose-Capillary: 109 mg/dL — ABNORMAL HIGH (ref 70–99)
Glucose-Capillary: 110 mg/dL — ABNORMAL HIGH (ref 70–99)
Glucose-Capillary: 117 mg/dL — ABNORMAL HIGH (ref 70–99)
Glucose-Capillary: 123 mg/dL — ABNORMAL HIGH (ref 70–99)
Glucose-Capillary: 89 mg/dL (ref 70–99)
Glucose-Capillary: 98 mg/dL (ref 70–99)

## 2022-05-18 LAB — RENAL FUNCTION PANEL
Albumin: 2 g/dL — ABNORMAL LOW (ref 3.5–5.0)
Albumin: 2 g/dL — ABNORMAL LOW (ref 3.5–5.0)
Anion gap: 12 (ref 5–15)
Anion gap: 14 (ref 5–15)
BUN: 85 mg/dL — ABNORMAL HIGH (ref 6–20)
BUN: 105 mg/dL — ABNORMAL HIGH (ref 6–20)
CO2: 26 mmol/L (ref 22–32)
CO2: 25 mmol/L (ref 22–32)
Calcium: 8.5 mg/dL — ABNORMAL LOW (ref 8.9–10.3)
Calcium: 8.6 mg/dL — ABNORMAL LOW (ref 8.9–10.3)
Chloride: 95 mmol/L — ABNORMAL LOW (ref 98–111)
Chloride: 93 mmol/L — ABNORMAL LOW (ref 98–111)
Creatinine, Ser: 5.17 mg/dL — ABNORMAL HIGH (ref 0.44–1.00)
Creatinine, Ser: 6.08 mg/dL — ABNORMAL HIGH (ref 0.44–1.00)
GFR, Estimated: 9 mL/min — ABNORMAL LOW (ref >60)
GFR, Estimated: 8 mL/min — ABNORMAL LOW (ref >60)
Glucose, Bld: 113 mg/dL — ABNORMAL HIGH (ref 70–99)
Glucose, Bld: 105 mg/dL — ABNORMAL HIGH (ref 70–99)
Phosphorus: 5.9 mg/dL — ABNORMAL HIGH (ref 2.5–4.6)
Phosphorus: 7.5 mg/dL — ABNORMAL HIGH (ref 2.5–4.6)
Potassium: 4.9 mmol/L (ref 3.5–5.1)
Potassium: 4.9 mmol/L (ref 3.5–5.1)
Sodium: 133 mmol/L — ABNORMAL LOW (ref 135–145)
Sodium: 132 mmol/L — ABNORMAL LOW (ref 135–145)

## 2022-05-18 LAB — TYPE AND SCREEN
ABO/RH(D): O POS
Antibody Screen: NEGATIVE
Unit division: 0

## 2022-05-18 LAB — BPAM RBC
Blood Product Expiration Date: 202310272359
ISSUE DATE / TIME: 202310201200
Unit Type and Rh: 5100

## 2022-05-18 LAB — CBC
HCT: 23.7 % — ABNORMAL LOW (ref 36.0–46.0)
Hemoglobin: 7.9 g/dL — ABNORMAL LOW (ref 12.0–15.0)
MCH: 28.2 pg (ref 26.0–34.0)
MCHC: 33.3 g/dL (ref 30.0–36.0)
MCV: 84.6 fL (ref 80.0–100.0)
Platelets: 279 10*3/uL (ref 150–400)
RBC: 2.8 MIL/uL — ABNORMAL LOW (ref 3.87–5.11)
RDW: 15.5 % (ref 11.5–15.5)
WBC: 5.6 10*3/uL (ref 4.0–10.5)
nRBC: 0 % (ref 0.0–0.2)

## 2022-05-18 LAB — MAGNESIUM: Magnesium: 2 mg/dL (ref 1.7–2.4)

## 2022-05-18 LAB — VITAMIN C: Vitamin C: 0.5 mg/dL (ref 0.4–2.0)

## 2022-05-18 MED ORDER — METOCLOPRAMIDE HCL 5 MG/ML IJ SOLN
10.0000 mg | Freq: Four times a day (QID) | INTRAMUSCULAR | Status: DC
  Administered 2022-05-18 – 2022-05-22 (×17): 10 mg via INTRAVENOUS
  Filled 2022-05-18 (×17): qty 2

## 2022-05-18 MED ORDER — HYDROMORPHONE HCL 1 MG/ML IJ SOLN
0.5000 mg | INTRAMUSCULAR | Status: DC | PRN
  Administered 2022-05-19 – 2022-05-20 (×3): 0.5 mg via INTRAVENOUS
  Filled 2022-05-18 (×3): qty 1

## 2022-05-18 MED ORDER — SODIUM ZIRCONIUM CYCLOSILICATE 10 G PO PACK
10.0000 g | PACK | Freq: Every day | ORAL | Status: AC
  Administered 2022-05-18: 10 g via ORAL
  Filled 2022-05-18 (×2): qty 1

## 2022-05-18 NOTE — Progress Notes (Signed)
Pt placed back on vent, PSV 8/5 per MD order. Will attempt ATC later today per MD order.

## 2022-05-18 NOTE — Progress Notes (Signed)
NAME:  Patricia [name], MRN:  [id number], DOB:  1970/11/03, LOS: 24 ADMISSION DATE:  04/23/2022, CONSULTATION DATE:  04/29/2022 REFERRING MD:  Felicie Morn, MD, CHIEF COMPLAINT:   Small bowel obstruction, aspiration event, sepsis, respiratory failure    History of Present Illness:  Patricia [name] with above hx and nuc study in 2015 neg for ischemia done for chest pain and echo with EF 60-65% presented to hospital for excisional Patricia repair (robotic Patricia repair 04/23/22) with mesh, ileus post op on TNA. Developed vomiting and hypotension after NG came out on 04/29/22 and PNA on CXR, was tachycardic and BP improved with IV fluids. DX with sepsis and ABX started. At one point prolonged Qtc. Pt admitted to ICU on 10/2.  Was placed on levophed. Early AM on 10/3 was intubated for worsening metabolic/lactic acidosis. She had AKI as well. Crt was 6.01, her normal 0.9. Today Cr is 4 on CRRT.  Pertinent  Medical History  DM2 Depression OSA Migraines  Asthma Obesity Schizophrenia HTN HLD  Significant Hospital Events: Including procedures, antibiotic start and stop dates in addition to other pertinent events   9/26 Robotic Patricia repair complicated by abd adhesions.  10/2 CT + for developing SBO and bilat lower lobe opacities most likely aspiration PNA. Progressive decompensation requiring transfer to ICU 10/2 PCCM consulted for respiratory failure & shock 10\3 Intubated for resp failure. CVL and arterial line placed. 04/30/22: EF 30-35%, RV moderately reduced, mild to moderate MR, IVC collapsed throughout respiratory cycle 10\4 EKG changes/trop increased--> and taken to cath lab - No obstructive CAD, but in decompensated heart failure, Bedside echo with EF 15%, new WMA, moderate RV dysfunction, trivial pericardial effusion.underwent emergent L/RHC. Normal coronaries, RA mean 13, PA mean 29, LVEDP 16, PCWP 19, CO/CI 3.96/1.87   returned to CICU post-cath. Right heart cath placed right fem.  Right IJ HD cath placed.  10/5 advanced HF team following, shock supported on norepi/vasopressin and Dobutamine. On CRRT.  10/6 weaning Norepi. Starting to wean fent. Needing restraints.  10/8: Reintubated due to neuromuscular weakness.  Bedside echo demonstrating improved RV function, LV function remained depressed 10/9 CT abdomen pelvis.suggesting wall thickening throughout the proximal and mid small bowel consistent with possible infectious or inflammatory enteritis there is also bibasilar airspace disease which could reflect pneumonia.  Antibiotic coverage widened.  Zosyn changed to meropenem.  Tolerating trickle feeds still on TPN 10/10 stopped stress dose steroids.  Resumed subcutaneous heparin. 10/13 albumin for coox decrease. Weaning vent . Failed SBT. At goal TF. 10/14 extubated to bipap failed due to weakness, trached 10/15 trial off CRRT, lasix  Interim History / Subjective:  Tolerated trach collar for 3 hours, now back on full vent support Remained afebrile  Objective   Blood pressure 126/76, pulse (!) 109, temperature 99.1 F (37.3 C), temperature source Oral, resp. rate 16, height '5\' 4"'$  (1.626 m), weight 108 kg, last menstrual period 07/07/2014, SpO2 99 %. CVP:  [1 mmHg-16 mmHg] 16 mmHg  Vent Mode: PSV;CPAP FiO2 (%):  [30 %-35 %] 30 % Set Rate:  [20 bmp] 20 bmp Vt Set:  [460 mL] 460 mL PEEP:  [5 cmH20] 5 cmH20 Pressure Support:  [8 cmH20] 8 cmH20 Plateau Pressure:  [17 cmH20-19 cmH20] 19 cmH20   Intake/Output Summary (Last 24 hours) at 05/18/2022 1529 Last data filed at 05/18/2022 1400 Gross per 24 hour  Intake 1240 ml  Output 3001 ml  Net -1761 ml   Filed Weights   05/17/22 1618 05/17/22 1942 05/18/22  0500  Weight: 110 kg 108 kg 108 kg    Examination: Physical exam: General: Acute on chronically ill-appearing morbidly obese female, lying on the bed, s/p trach HEENT: [place]/AT, eyes anicteric.  moist mucus membranes Neuro: Sleepy, opens eyes with vocal stimuli,  following simple commands Chest: Reduced air entry at the bases bilaterally, no wheezes or rhonchi Heart: Regular rate and rhythm, no murmurs or gallops Abdomen: Soft, nontender, nondistended, bowel sounds present Skin: No rash   Assessment & Plan:  Acute metabolic encephalopathy superimposed on hx bipolar disorder, schizophrenia   Continue to taper dilaudid, decrease frequency to every 4 hour Cont clonazepam 0.5 mg, now only at at bedtime Cont seroquel  Acute hypoxic respiratory failure d/t Aspiration PNA, (RLL) and c/b Pulm edema   Completed antibiotic therapy White count is currently down, remained afebrile Continue to monitor intake and output Try trach collar as long as patient can tolerate  Acute biventricular HFrEF (Etiology sepsis, stress induced CM. Has clean coronaries) Cardiogenic shock resolved, EF improved.  Monitor I/O  AKI due to septic ATN, no renal recovery Hyponatremia Patient is getting scheduled dialysis on Monday/Wednesday/Friday Closely monitor electrolytes Nephrology is following  S/p Patricia repair &  LOA inflammatory enteritis  Continuing tube feeds Surgical care as directed by surgical team  Anemia, acute on chronic  Patient received 1 unit of PRBC yesterday H&H improved to 7.9 Monitor H&H and transfuse if less than 7   DM2 with hyperglycemia  Patient blood sugars are well controlled now continue sliding scale insulin Goal 140-180 No change in current tube feed coverage  Severe protein calorie malnutrition  Morbid obesity Dietitian is following Cont TFs  Pressure injury- lip in the setting of ET tube: Not present on admission Routine wound care Added and antibiotic cream per family request  Best Practice (right click and "Reselect all SmartList Selections" daily)   Diet/type: tubefeeds DVT prophylaxis: prophylactic heparin  GI prophylaxis: PPI Lines: Central line and Dialysis Catheter Foley:  Yes, and it is still needed Code  Status:  full code   Total critical care time: 34 minutes  Performed by: [place] care time was exclusive of separately billable procedures and treating other patients.   Critical care was necessary to treat or prevent imminent or life-threatening deterioration.   Critical care was time spent personally by me on the following activities: development of treatment plan with patient and/or surrogate as well as nursing, discussions with consultants, evaluation of patient's response to treatment, examination of patient, obtaining history from patient or surrogate, ordering and performing treatments and interventions, ordering and review of laboratory studies, ordering and review of radiographic studies, pulse oximetry and re-evaluation of patient's condition.   Jacky Kindle, MD [facility] See Amion for pager If no response to pager, please call [phone] until 7pm After 7pm, Please call E-link [phone]

## 2022-05-18 NOTE — Progress Notes (Signed)
Pt placed on 35% ATC and is tolerating well at this time. RT to continue to monitor.

## 2022-05-18 NOTE — Progress Notes (Signed)
Patient ID: ILIANNA BOWN, female   DOB: [birth year], 51 y.o.   MRN: [id number] 17 Days Post-Op    Subjective: Awake on vent ROS negative except as listed above. Objective: Vital signs in last 24 hours: Temp:  [97.8 F (36.6 C)-99.7 F (37.6 C)] 99.7 F (37.6 C) (10/21 0007) Pulse Rate:  [105-122] 105 (10/21 0812) Resp:  [19-39] 27 (10/21 0812) BP: (108-156)/(59-107) 119/79 (10/21 0500) SpO2:  [91 %-100 %] 98 % (10/21 0812) FiO2 (%):  [30 %-35 %] 35 % (10/21 0812) Weight:  [108 kg-110 kg] 108 kg (10/20 1942) Last BM Date : 05/17/22  Intake/Output from previous day: 10/20 0701 - 10/21 0700 In: 1895 [I.V.:130; Blood:315; NG/GT:1450] Out: 3001 [Stool:1] Intake/Output this shift: No intake/output data recorded.  Neck: trach GI: soft, mild TTP R side, no peritonitis  Lab Results: CBC  Recent Labs    05/17/22 0459 05/18/22 0500  WBC 5.8 5.6  HGB 6.8* 7.9*  HCT 21.4* 23.7*  PLT 269 279   BMET Recent Labs    05/17/22 1640 05/18/22 0500  NA 132* 133*  K 5.3* 4.9  CL 96* 95*  CO2 25 26  GLUCOSE 112* 113*  BUN 122* 85*  CREATININE 6.16* 5.17*  CALCIUM 8.4* 8.5*   PT/INR Recent Labs    05/17/22 0617  LABPROT 14.3  INR 1.1   ABG No results for input(s): "PHART", "HCO3" in the last 72 hours.  Invalid input(s): "PCO2", "PO2"  Studies/Results: No results found.  Anti-infectives: Anti-infectives (From admission, onward)    Start     Dose/Rate Route Frequency Ordered Stop   05/15/22 1445  ceFAZolin (ANCEF) IVPB 2g/100 mL premix       Note to Pharmacy: To be given in IR   2 g 200 mL/hr over 30 Minutes Intravenous  Once 05/15/22 1352 05/15/22 1616   05/15/22 1230  ceFAZolin (ANCEF) powder 1 g  Status:  Discontinued        1 g Other To Surgery 05/15/22 1140 05/15/22 1239   05/15/22 1230  ceFAZolin (ANCEF) IVPB 2g/100 mL premix        2 g 200 mL/hr over 30 Minutes Intravenous To Radiology 05/15/22 1140 05/15/22 1140   05/15/22 0000  ceFAZolin (ANCEF)  IVPB 2g/100 mL premix        2 g 200 mL/hr over 30 Minutes Intravenous To Radiology 05/14/22 1308 05/15/22 0005   05/13/22 2134  meropenem (MERREM) 500 mg in sodium chloride 0.9 % 100 mL IVPB        500 mg 200 mL/hr over 30 Minutes Intravenous Every 24 hours 05/13/22 0815 05/17/22 0700   05/13/22 1000  meropenem (MERREM) 500 mg in sodium chloride 0.9 % 100 mL IVPB  Status:  Discontinued        500 mg 200 mL/hr over 30 Minutes Intravenous Every 12 hours 05/12/22 0940 05/12/22 0946   05/12/22 2200  meropenem (MERREM) 500 mg in sodium chloride 0.9 % 100 mL IVPB  Status:  Discontinued        500 mg 200 mL/hr over 30 Minutes Intravenous Every 12 hours 05/12/22 0946 05/13/22 0815   05/12/22 2000  meropenem (MERREM) 1 g in sodium chloride 0.9 % 100 mL IVPB  Status:  Discontinued        1 g 200 mL/hr over 30 Minutes Intravenous  Once 05/12/22 0940 05/12/22 0946   05/06/22 1530  meropenem (MERREM) 1 g in sodium chloride 0.9 % 100 mL IVPB  Status:  Discontinued  1 g 200 mL/hr over 30 Minutes Intravenous Every 8 hours 05/06/22 1438 05/12/22 0940   05/04/22 1000  vancomycin (VANCOCIN) IVPB 1000 mg/200 mL premix  Status:  Discontinued        1,000 mg 200 mL/hr over 60 Minutes Intravenous Every 24 hours 05/03/22 0802 05/08/22 0913   05/03/22 0845  vancomycin (VANCOREADY) IVPB 2000 mg/400 mL        2,000 mg 200 mL/hr over 120 Minutes Intravenous  Once 05/03/22 0751 05/03/22 1114   05/01/22 1800  piperacillin-tazobactam (ZOSYN) IVPB 3.375 g  Status:  Discontinued        3.375 g 12.5 mL/hr over 240 Minutes Intravenous Every 6 hours 05/01/22 1426 05/01/22 1427   05/01/22 1515  piperacillin-tazobactam (ZOSYN) IVPB 3.375 g  Status:  Discontinued        3.375 g 100 mL/hr over 30 Minutes Intravenous Every 6 hours 05/01/22 1428 05/06/22 1438   04/30/22 1400  piperacillin-tazobactam (ZOSYN) IVPB 2.25 g  Status:  Discontinued        2.25 g 100 mL/hr over 30 Minutes Intravenous Every 8 hours 04/30/22  1325 05/01/22 1426   04/29/22 1530  piperacillin-tazobactam (ZOSYN) IVPB 3.375 g  Status:  Discontinued        3.375 g 12.5 mL/hr over 240 Minutes Intravenous Every 8 hours 04/29/22 1433 04/30/22 1325   04/29/22 1145  metroNIDAZOLE (FLAGYL) IVPB 500 mg  Status:  Discontinued        500 mg 100 mL/hr over 60 Minutes Intravenous Every 12 hours 04/29/22 1047 04/29/22 1433   04/29/22 1145  vancomycin (VANCOCIN) IVPB 1000 mg/200 mL premix  Status:  Discontinued        1,000 mg 200 mL/hr over 60 Minutes Intravenous  Once 04/29/22 1047 04/29/22 1055   04/29/22 1145  vancomycin (VANCOREADY) IVPB 1500 mg/300 mL  Status:  Discontinued        1,500 mg 150 mL/hr over 120 Minutes Intravenous Every 24 hours 04/29/22 1055 04/29/22 1357   04/29/22 1045  ceFEPIme (MAXIPIME) 2 g in sodium chloride 0.9 % 100 mL IVPB  Status:  Discontinued        2 g 200 mL/hr over 30 Minutes Intravenous Every 8 hours 04/29/22 0954 04/29/22 1433   04/23/22 0730  ceFAZolin (ANCEF) IVPB 2g/100 mL premix        2 g 200 mL/hr over 30 Minutes Intravenous On call to O.R. 04/23/22 0726 04/23/22 0828       Assessment/Plan: Ms. Vicars is s/p robotic incisional hernia repair on 04/23/22 -Postop ileus complicated by aspiration: Ileus resolved, having bowel function.   - Inflammatory enteritis likely due to ischemic insult with dilated bowel at time of high pressor support - Abdomen fairly benign on exam. - WBC normalized - vomited OVN, try reglan. Resume TF at 7pm, flexiseal back in as well   Acute metabolic encephalopathy superimposed on hx bipolar disorder, schizophrenia   ICU delirium- improved now off all IV infusions. Acute biventricular HFrEF (Etiology sepsis, stress induced CM. Has clean coronaries) Cardiogenic shock resolved, EF improved.  Acute hypoxic respiratory failure Aspiration PNA Pulm edema   RLL VAP  AKI with oliguria; no baseline CKD  Anemia, acute on chronic  DM2 with hyperglycemia  Severe protein  calorie malnutrition  Pressure injury lip - improving with local care and trach [place] with multiple issues  Dispo - CM consult   LOS: 24 days    Georganna Skeans, MD, MPH, FACS Trauma & General Surgery  Use [email or web address] to contact on call provider  05/18/2022

## 2022-05-18 NOTE — Progress Notes (Signed)
Pt placed on 35% ATC and is tolerating well at this time. Per MD pt is to attempt 5-6 hrs on ATC.

## 2022-05-18 NOTE — Progress Notes (Signed)
Patient ID: Patricia [name], female   DOB: Jul 15, 1971, 51 y.o.   MRN: [id number] S: Having some abdominal pain. Otherwise no complaints.  Tolerated dialysis yesterday with no issues.  3 L removed. O:BP 119/72   Pulse (!) 106   Temp 98.8 F (37.1 C) (Oral)   Resp 19   Ht '5\' 4"'$  (1.626 m)   Wt 108 kg Comment: Post-hemo per prior.  LMP 07/07/2014 (Approximate)   SpO2 96%   BMI 40.87 kg/m   Intake/Output Summary (Last 24 hours) at 05/18/2022 1004 Last data filed at 05/18/2022 0905 Gross per 24 hour  Intake 1815 ml  Output 3001 ml  Net -1186 ml   Intake/Output: I/O last 3 completed shifts: In: 3328.2 [I.V.:1513.2; Blood:315; NG/GT:1500] Out: 3001 [Other:3000; Stool:1]  Intake/Output this shift:  Total I/O In: 325 [I.V.:25; NG/GT:300] Out: -  Weight change: -0.1 kg Gen:on vent via trach, awake CVS: Tachycardia Resp:Ventilated, bilateral chest rise Abd: +BS, soft, NT Ext: Trace edema in the bilateral hands with legs Neuro: awake  Recent Labs  Lab 05/15/22 0500 05/15/22 1333 05/16/22 0407 05/16/22 1655 05/17/22 0459 05/17/22 1640 05/18/22 0500  NA 136 135 134* 134* 134* 132* 133*  K 5.0 4.6 4.7 5.2* 5.8* 5.3* 4.9  CL 98 103 96* 97* 97* 96* 95*  CO2 21* 18* '25 25 22 25 26  '$ GLUCOSE 183* 220* 132* 132* 127* 112* 113*  BUN 112* 105* 73* 101* 120* 122* 85*  CREATININE 5.96* 5.61* 4.51* 5.49* 6.61* 6.16* 5.17*  ALBUMIN <1.5* <1.5* 1.6* 1.6* 1.6* 1.8* 2.0*  CALCIUM 8.4* 7.0* 8.2* 8.2* 8.1* 8.4* 8.5*  PHOS 7.8* 7.0* 6.6* 7.4* 7.9* 6.7* 5.9*   Liver Function Tests: Recent Labs  Lab 05/17/22 0459 05/17/22 1640 05/18/22 0500  ALBUMIN 1.6* 1.8* 2.0*   No results for input(s): "LIPASE", "AMYLASE" in the last 168 hours. No results for input(s): "AMMONIA" in the last 168 hours. CBC: Recent Labs  Lab 05/14/22 0520 05/15/22 0500 05/16/22 0407 05/17/22 0459 05/18/22 0500  WBC 8.8 6.7 6.2 5.8 5.6  HGB 7.0* 6.9* 7.7* 6.8* 7.9*  HCT 21.1* 20.9* 23.1* 21.4* 23.7*  MCV  85.4 85.7 84.0 85.9 84.6  PLT 201 241 271 269 279   Cardiac Enzymes: No results for input(s): "CKTOTAL", "CKMB", "CKMBINDEX", "TROPONINI" in the last 168 hours. CBG: Recent Labs  Lab 05/17/22 1605 05/17/22 2007 05/18/22 0006 05/18/22 0351 05/18/22 0816  GLUCAP 121* 95 117* 123* 89    Iron Studies:  Recent Labs    05/15/22 1333  FERRITIN 167   Studies/Results: No results found.  sodium chloride   Intravenous Once   Chlorhexidine Gluconate Cloth  6 each Topical Q0600   clonazePAM  0.5 mg Per Tube QHS   darbepoetin (ARANESP) injection - DIALYSIS  100 mcg Intravenous Q Fri-HD   feeding supplement (PROSource TF20)  60 mL Per Tube BID   fiber  1 packet Per Tube BID   heparin injection (subcutaneous)  5,000 Units Subcutaneous Q8H   HYDROmorphone HCl  1.5 mg Per Tube Q4H   Followed by   [START ON 05/19/2022] HYDROmorphone HCl  1 mg Per Tube Q4H   Followed by   [START ON 05/22/2022] HYDROmorphone HCl  0.5 mg Per Tube Q4H   insulin aspart  0-20 Units Subcutaneous Q4H   insulin aspart  4 Units Subcutaneous Q4H   metoCLOPramide (REGLAN) injection  10 mg Intravenous Q6H   multivitamin  1 tablet Per Tube QHS   neomycin-bacitracin-polymyxin   Topical BID  nutrition supplement (JUVEN)  1 packet Oral BID BM   mouth rinse  15 mL Mouth Rinse Q2H   pantoprazole  40 mg Per Tube Daily   QUEtiapine  50 mg Per Tube BID   sodium chloride flush  10-40 mL Intracatheter Q12H    BMET    Component Value Date/Time   NA 133 (L) 05/18/2022 0500   NA 139 07/02/2014 1923   K 4.9 05/18/2022 0500   K 3.9 07/02/2014 1923   CL 95 (L) 05/18/2022 0500   CL 105 07/02/2014 1923   CO2 26 05/18/2022 0500   CO2 26 07/02/2014 1923   GLUCOSE 113 (H) 05/18/2022 0500   GLUCOSE 95 07/02/2014 1923   BUN 85 (H) 05/18/2022 0500   BUN 7 07/02/2014 1923   CREATININE 5.17 (H) 05/18/2022 0500   CREATININE 0.98 07/02/2014 1923   CALCIUM 8.5 (L) 05/18/2022 0500   CALCIUM 8.5 07/02/2014 1923   GFRNONAA 9 (L)  05/18/2022 0500   GFRNONAA >60 07/02/2014 1923   GFRAA >60 04/22/2018 2215   GFRAA >60 07/02/2014 1923   CBC    Component Value Date/Time   WBC 5.6 05/18/2022 0500   RBC 2.80 (L) 05/18/2022 0500   HGB 7.9 (L) 05/18/2022 0500   HGB 9.4 (L) 07/02/2014 1923   HCT 23.7 (L) 05/18/2022 0500   HCT 30.3 (L) 07/02/2014 1923   PLT 279 05/18/2022 0500   PLT 449 (H) 07/02/2014 1923   MCV 84.6 05/18/2022 0500   MCV 71 (L) 07/02/2014 1923   MCH 28.2 05/18/2022 0500   MCHC 33.3 05/18/2022 0500   RDW 15.5 05/18/2022 0500   RDW 18.8 (H) 07/02/2014 1923   LYMPHSABS 2.0 05/07/2022 0428   MONOABS 2.4 (H) 05/07/2022 0428   EOSABS 0.0 05/07/2022 0428   BASOSABS 0.1 05/07/2022 0428   Assessment/Plan:   AKI - likely ATN in the setting of cardiogenic and septic shock. CRRT 10/4-10/14/23 via RIJ temp HD catheter. First IHD 10/16.  No signs of recovery at this time.  Failed diuretic challenge Will keep HD on MWF schedule. S/p [facility] 10/18 Avoid nephrotoxic medications including NSAIDs and iodinated intravenous contrast exposure unless the latter is absolutely indicated.  Preferred narcotic agents for pain control are hydromorphone, fentanyl, and methadone. Morphine should not be used. Avoid Baclofen and avoid oral sodium phosphate and magnesium citrate based laxatives / bowel preps. Continue strict Input and Output monitoring. Will monitor the patient closely with you and intervene or adjust therapy as indicated by changes in clinical status/labs  Cardiogenic/septic shock - off pressors now.  Heart failure team had been following. Likely due to aspiration pneumonia. Acute hypoxic respiratory failure - was re-intubated 05/05/22.  Extubated again on 10/14 but failed BiPAP, s/p trach and back on Vent 05/11/22 per PCCM. TC trials per CCM Infectious enteritis - seen on CT scan.  Management per CCM/surgery Anemia of critical illness - transfuse for Hgb <7. Requiring PRBCs. Per primary. ESA started 10/18 Acute MI -  normal coronaries by cath 10/4 Hyperkalemia: ongoing issue. Preemptive lokelma today and tomorrow Thrombocytopenia - improved/resolved S/p robotic incisional hernia repair with mesh and LOA - Surgery following.

## 2022-05-19 DIAGNOSIS — J9610 Chronic respiratory failure, unspecified whether with hypoxia or hypercapnia: Secondary | ICD-10-CM

## 2022-05-19 DIAGNOSIS — A419 Sepsis, unspecified organism: Secondary | ICD-10-CM | POA: Diagnosis not present

## 2022-05-19 DIAGNOSIS — Z930 Tracheostomy status: Secondary | ICD-10-CM | POA: Diagnosis not present

## 2022-05-19 DIAGNOSIS — R6521 Severe sepsis with septic shock: Secondary | ICD-10-CM | POA: Diagnosis not present

## 2022-05-19 LAB — RENAL FUNCTION PANEL
Albumin: 1.9 g/dL — ABNORMAL LOW (ref 3.5–5.0)
Albumin: 1.8 g/dL — ABNORMAL LOW (ref 3.5–5.0)
Anion gap: 18 — ABNORMAL HIGH (ref 5–15)
Anion gap: 12 (ref 5–15)
BUN: 122 mg/dL — ABNORMAL HIGH (ref 6–20)
BUN: 136 mg/dL — ABNORMAL HIGH (ref 6–20)
CO2: 23 mmol/L (ref 22–32)
CO2: 24 mmol/L (ref 22–32)
Calcium: 8.9 mg/dL (ref 8.9–10.3)
Calcium: 8.3 mg/dL — ABNORMAL LOW (ref 8.9–10.3)
Chloride: 92 mmol/L — ABNORMAL LOW (ref 98–111)
Chloride: 93 mmol/L — ABNORMAL LOW (ref 98–111)
Creatinine, Ser: 7.08 mg/dL — ABNORMAL HIGH (ref 0.44–1.00)
Creatinine, Ser: 7.69 mg/dL — ABNORMAL HIGH (ref 0.44–1.00)
GFR, Estimated: 6 mL/min — ABNORMAL LOW (ref >60)
GFR, Estimated: 6 mL/min — ABNORMAL LOW (ref >60)
Glucose, Bld: 121 mg/dL — ABNORMAL HIGH (ref 70–99)
Glucose, Bld: 123 mg/dL — ABNORMAL HIGH (ref 70–99)
Phosphorus: 8.2 mg/dL — ABNORMAL HIGH (ref 2.5–4.6)
Phosphorus: 8.7 mg/dL — ABNORMAL HIGH (ref 2.5–4.6)
Potassium: 5.3 mmol/L — ABNORMAL HIGH (ref 3.5–5.1)
Potassium: 5.4 mmol/L — ABNORMAL HIGH (ref 3.5–5.1)
Sodium: 133 mmol/L — ABNORMAL LOW (ref 135–145)
Sodium: 129 mmol/L — ABNORMAL LOW (ref 135–145)

## 2022-05-19 LAB — GLUCOSE, CAPILLARY
Glucose-Capillary: 112 mg/dL — ABNORMAL HIGH (ref 70–99)
Glucose-Capillary: 130 mg/dL — ABNORMAL HIGH (ref 70–99)
Glucose-Capillary: 134 mg/dL — ABNORMAL HIGH (ref 70–99)
Glucose-Capillary: 127 mg/dL — ABNORMAL HIGH (ref 70–99)
Glucose-Capillary: 113 mg/dL — ABNORMAL HIGH (ref 70–99)
Glucose-Capillary: 121 mg/dL — ABNORMAL HIGH (ref 70–99)

## 2022-05-19 LAB — MAGNESIUM: Magnesium: 2.2 mg/dL (ref 1.7–2.4)

## 2022-05-19 LAB — CBC
HCT: 23.7 % — ABNORMAL LOW (ref 36.0–46.0)
Hemoglobin: 8 g/dL — ABNORMAL LOW (ref 12.0–15.0)
MCH: 28.5 pg (ref 26.0–34.0)
MCHC: 33.8 g/dL (ref 30.0–36.0)
MCV: 84.3 fL (ref 80.0–100.0)
Platelets: 303 10*3/uL (ref 150–400)
RBC: 2.81 MIL/uL — ABNORMAL LOW (ref 3.87–5.11)
RDW: 15.5 % (ref 11.5–15.5)
WBC: 5 10*3/uL (ref 4.0–10.5)
nRBC: 0 % (ref 0.0–0.2)

## 2022-05-19 MED ORDER — METOPROLOL TARTRATE 12.5 MG HALF TABLET
12.5000 mg | ORAL_TABLET | Freq: Two times a day (BID) | ORAL | Status: DC
  Administered 2022-05-19 – 2022-05-24 (×11): 12.5 mg
  Filled 2022-05-19 (×12): qty 1

## 2022-05-19 MED ORDER — JUVEN PO PACK
1.0000 | PACK | Freq: Two times a day (BID) | ORAL | Status: DC
  Administered 2022-05-19 – 2022-05-30 (×18): 1
  Filled 2022-05-19 (×16): qty 1

## 2022-05-19 MED ORDER — METOPROLOL TARTRATE 25 MG PO TABS: 12.5000 mg | ORAL_TABLET | Freq: Two times a day (BID) | ORAL | Status: DC

## 2022-05-19 MED ORDER — SODIUM ZIRCONIUM CYCLOSILICATE 10 G PO PACK
10.0000 g | PACK | Freq: Two times a day (BID) | ORAL | Status: AC
  Administered 2022-05-19: 10 g
  Filled 2022-05-19: qty 1

## 2022-05-19 MED ORDER — SODIUM ZIRCONIUM CYCLOSILICATE 10 G PO PACK: 10.0000 g | PACK | Freq: Every day | ORAL | Status: DC

## 2022-05-19 MED ORDER — ORAL CARE MOUTH RINSE
15.0000 mL | OROMUCOSAL | Status: DC
  Administered 2022-05-19 – 2022-05-30 (×34): 15 mL via OROMUCOSAL

## 2022-05-19 MED ORDER — SODIUM ZIRCONIUM CYCLOSILICATE 10 G PO PACK: 10.0000 g | PACK | Freq: Two times a day (BID) | ORAL | Status: DC

## 2022-05-19 MED ORDER — ALUM & MAG HYDROXIDE-SIMETH 200-200-20 MG/5ML PO SUSP
30.0000 mL | ORAL | Status: DC | PRN
  Administered 2022-05-20 – 2022-06-07 (×4): 30 mL
  Filled 2022-05-19 (×5): qty 30

## 2022-05-19 MED ORDER — ORAL CARE MOUTH RINSE: 15.0000 mL | OROMUCOSAL | Status: DC | PRN

## 2022-05-19 NOTE — Progress Notes (Signed)
RN wasted 0.46m of dilaudid in stericycle with JPatsy Lager RN

## 2022-05-19 NOTE — Progress Notes (Signed)
eLink Physician-Brief Progress Note Patient Name: Patricia [name] DOB: February 20, 1971 MRN: [id number]   Date of Service  05/19/2022  HPI/Events of Note  K 5.4 at 1521 was ordered to give lokelma but was not given. Received request to have it reordered  eICU Interventions  Lokelma re-ordered     Intervention Category Minor Interventions: Other:  Judd Lien 05/19/2022, 11:45 PM

## 2022-05-19 NOTE — Progress Notes (Signed)
NAME:  Patricia [name], MRN:  [id number], DOB:  March 22, 1971, LOS: 25 ADMISSION DATE:  04/23/2022, CONSULTATION DATE:  04/29/2022 REFERRING MD:  Felicie Morn, MD, CHIEF COMPLAINT:   Small bowel obstruction, aspiration event, sepsis, respiratory failure    History of Present Illness:  Ms. Patricia [name] with above hx and nuc study in 2015 neg for ischemia done for chest pain and echo with EF 60-65% presented to hospital for excisional hernia repair (robotic hernia repair 04/23/22) with mesh, ileus post op on TNA. Developed vomiting and hypotension after NG came out on 04/29/22 and PNA on CXR, was tachycardic and BP improved with IV fluids. DX with sepsis and ABX started. At one point prolonged Qtc. Pt admitted to ICU on 10/2.  Was placed on levophed. Early AM on 10/3 was intubated for worsening metabolic/lactic acidosis. She had AKI as well. Crt was 6.01, her normal 0.9. Today Cr is 4 on CRRT.  Pertinent  Medical History  DM2 Depression OSA Migraines  Asthma Obesity Schizophrenia HTN HLD  Significant Hospital Events: Including procedures, antibiotic start and stop dates in addition to other pertinent events   9/26 Robotic hernia repair complicated by abd adhesions.  10/2 CT + for developing SBO and bilat lower lobe opacities most likely aspiration PNA. Progressive decompensation requiring transfer to ICU 10/2 PCCM consulted for respiratory failure & shock 10\3 Intubated for resp failure. CVL and arterial line placed. 04/30/22: EF 30-35%, RV moderately reduced, mild to moderate MR, IVC collapsed throughout respiratory cycle 10\4 EKG changes/trop increased--> and taken to cath lab - No obstructive CAD, but in decompensated heart failure, Bedside echo with EF 15%, new WMA, moderate RV dysfunction, trivial pericardial effusion.underwent emergent L/RHC. Normal coronaries, RA mean 13, PA mean 29, LVEDP 16, PCWP 19, CO/CI 3.96/1.87   returned to CICU post-cath. Right heart cath placed right fem.  Right IJ HD cath placed.  10/5 advanced HF team following, shock supported on norepi/vasopressin and Dobutamine. On CRRT.  10/6 weaning Norepi. Starting to wean fent. Needing restraints.  10/8: Reintubated due to neuromuscular weakness.  Bedside echo demonstrating improved RV function, LV function remained depressed 10/9 CT abdomen pelvis.suggesting wall thickening throughout the proximal and mid small bowel consistent with possible infectious or inflammatory enteritis there is also bibasilar airspace disease which could reflect pneumonia.  Antibiotic coverage widened.  Zosyn changed to meropenem.  Tolerating trickle feeds still on TPN 10/10 stopped stress dose steroids.  Resumed subcutaneous heparin. 10/13 albumin for coox decrease. Weaning vent . Failed SBT. At goal TF. 10/14 extubated to bipap failed due to weakness, trached 10/15 trial off CRRT, lasix 10/22 transfer 4N as a lateral   Interim History / Subjective:   Remains in the ICU, tolerated trach collar trial yesterday for short period  Objective   Blood pressure 113/69, pulse (!) 102, temperature 98.5 F (36.9 C), temperature source Axillary, resp. rate (!) 21, height '5\' 4"'$  (1.626 m), weight 108 kg, last menstrual period 07/07/2014, SpO2 99 %. CVP:  [4 mmHg-30 mmHg] 7 mmHg  Vent Mode: PRVC FiO2 (%):  [30 %-35 %] 30 % Set Rate:  [20 bmp] 20 bmp Vt Set:  [460 mL] 460 mL PEEP:  [5 cmH20] 5 cmH20 Pressure Support:  [8 cmH20] 8 cmH20 Plateau Pressure:  [17 cmH20] 17 cmH20   Intake/Output Summary (Last 24 hours) at 05/19/2022 0734 Last data filed at 05/19/2022 0100 Gross per 24 hour  Intake 875 ml  Output 0 ml  Net 875 ml   [employer]  05/17/22 1618 05/17/22 1942 05/18/22 0500  Weight: 110 kg 108 kg 108 kg    Examination: Physical exam: General: Chronically ill-appearing obese female lying in bed status post trach HEENT: NCAT, tracking appropriately, tracheostomy tube in place, no significant secretions, easily  cleared with suction catheter Neuro: Opens eyes to voice follows simple commands Chest: Diminished breath sounds bilaterally, some anterior rhonchi Heart: Regular rate rhythm, S1-S2 Abdomen: Soft, nontender nondistended Skin: No rash   Assessment & Plan:   Acute metabolic encephalopathy superimposed on hx bipolar disorder, schizophrenia   Plan: Continue taper of Dilaudid Continue clonazepam 0.5 mg nightly Continue Seroquel  Acute hypoxic respiratory failure d/t Aspiration PNA, (RLL) and c/b Pulmonary edema   Plan: Completed course of antibiotics Respiratory failure has improved. Continue to monitor urine output and I's and O's. Dialysis with nephrology to maintain euvolemia  Acute biventricular HFrEF (Etiology sepsis, stress induced CM. Has clean coronaries) Cardiogenic shock resolved, EF improved.  Monitor I's and O's Mildly reduced ejection fraction, improved now with repeat echocardiogram, 50 to 55% Start low-dose beta-blocker, 12.5 mg metoprolol twice daily Unable to tolerate ACE inhibitor in setting of recent AKI  AKI due to septic ATN, no renal recovery Hyponatremia Hyperkalemia Continue dialysis Monday Wednesday Friday Monitor electrolytes Lokelma x2  S/p hernia repair &  LOA inflammatory enteritis  Continue tube feeds Postop care by surgical team  Anemia, acute on chronic  Monitor for any signs of bleeding H&H stable Transfusion threshold for hemoglobin less than 7  DM2 with hyperglycemia  Follow blood sugars, continue sliding scale insulin Goal CBG 140-180  Severe protein calorie malnutrition  Morbid obesity Continue tube feeds  Pressure injury- lip in the setting of ET tube: Not present on admission Wound care team   Best Practice (right click and "Reselect all SmartList Selections" daily)   Diet/type: tubefeeds DVT prophylaxis: prophylactic heparin  GI prophylaxis: PPI Lines: Central line and Dialysis Catheter Foley:  Yes, and it is still  needed Code Status:  full code   This patient is critically ill with multiple organ system failure; which, requires frequent high complexity decision making, assessment, support, evaluation, and titration of therapies. This was completed through the application of advanced monitoring technologies and extensive interpretation of multiple databases. During this encounter critical care time was devoted to patient care services described in this note for 32 minutes.  Garner Nash, DO [facility] 05/19/2022 7:34 AM

## 2022-05-19 NOTE — Progress Notes (Signed)
Patient ID: SHAUNEE MULKERN, female   DOB: October 25, 1970, 51 y.o.   MRN: [id number] 18 Days Post-Op    Subjective: On HTC ROS negative except as listed above. Objective: Vital signs in last 24 hours: Temp:  [98.4 F (36.9 C)-99.1 F (37.3 C)] 98.5 F (36.9 C) (10/22 0400) Pulse Rate:  [102-117] 109 (10/22 0736) Resp:  [16-34] 34 (10/22 0736) BP: (105-143)/(62-84) 115/67 (10/22 0736) SpO2:  [91 %-100 %] 97 % (10/22 0736) FiO2 (%):  [30 %-35 %] 35 % (10/22 0736) Last BM Date : 05/18/22  Intake/Output from previous day: 10/21 0701 - 10/22 0700 In: 880 [I.V.:70; NG/GT:780] Out: 0  Intake/Output this shift: No intake/output data recorded.  General appearance: cooperative Neck: trach GI: soft, NT  Lab Results: CBC  Recent Labs    05/18/22 0500 05/19/22 0345  WBC 5.6 5.0  HGB 7.9* 8.0*  HCT 23.7* 23.7*  PLT 279 303   BMET Recent Labs    05/18/22 1553 05/19/22 0345  NA 132* 133*  K 4.9 5.3*  CL 93* 92*  CO2 25 23  GLUCOSE 105* 121*  BUN 105* 122*  CREATININE 6.08* 7.08*  CALCIUM 8.6* 8.9   PT/INR Recent Labs    05/17/22 0617  LABPROT 14.3  INR 1.1   ABG No results for input(s): "PHART", "HCO3" in the last 72 hours.  Invalid input(s): "PCO2", "PO2"  Studies/Results: No results found.  Anti-infectives: Anti-infectives (From admission, onward)    Start     Dose/Rate Route Frequency Ordered Stop   05/15/22 1445  ceFAZolin (ANCEF) IVPB 2g/100 mL premix       Note to Pharmacy: To be given in IR   2 g 200 mL/hr over 30 Minutes Intravenous  Once 05/15/22 1352 05/15/22 1616   05/15/22 1230  ceFAZolin (ANCEF) powder 1 g  Status:  Discontinued        1 g Other To Surgery 05/15/22 1140 05/15/22 1239   05/15/22 1230  ceFAZolin (ANCEF) IVPB 2g/100 mL premix        2 g 200 mL/hr over 30 Minutes Intravenous To Radiology 05/15/22 1140 05/15/22 1140   05/15/22 0000  ceFAZolin (ANCEF) IVPB 2g/100 mL premix        2 g 200 mL/hr over 30 Minutes Intravenous To  Radiology 05/14/22 1308 05/15/22 0005   05/13/22 2134  meropenem (MERREM) 500 mg in sodium chloride 0.9 % 100 mL IVPB        500 mg 200 mL/hr over 30 Minutes Intravenous Every 24 hours 05/13/22 0815 05/17/22 0700   05/13/22 1000  meropenem (MERREM) 500 mg in sodium chloride 0.9 % 100 mL IVPB  Status:  Discontinued        500 mg 200 mL/hr over 30 Minutes Intravenous Every 12 hours 05/12/22 0940 05/12/22 0946   05/12/22 2200  meropenem (MERREM) 500 mg in sodium chloride 0.9 % 100 mL IVPB  Status:  Discontinued        500 mg 200 mL/hr over 30 Minutes Intravenous Every 12 hours 05/12/22 0946 05/13/22 0815   05/12/22 2000  meropenem (MERREM) 1 g in sodium chloride 0.9 % 100 mL IVPB  Status:  Discontinued        1 g 200 mL/hr over 30 Minutes Intravenous  Once 05/12/22 0940 05/12/22 0946   05/06/22 1530  meropenem (MERREM) 1 g in sodium chloride 0.9 % 100 mL IVPB  Status:  Discontinued        1 g 200 mL/hr over 30 Minutes Intravenous  Every 8 hours 05/06/22 1438 05/12/22 0940   05/04/22 1000  vancomycin (VANCOCIN) IVPB 1000 mg/200 mL premix  Status:  Discontinued        1,000 mg 200 mL/hr over 60 Minutes Intravenous Every 24 hours 05/03/22 0802 05/08/22 0913   05/03/22 0845  vancomycin (VANCOREADY) IVPB 2000 mg/400 mL        2,000 mg 200 mL/hr over 120 Minutes Intravenous  Once 05/03/22 0751 05/03/22 1114   05/01/22 1800  piperacillin-tazobactam (ZOSYN) IVPB 3.375 g  Status:  Discontinued        3.375 g 12.5 mL/hr over 240 Minutes Intravenous Every 6 hours 05/01/22 1426 05/01/22 1427   05/01/22 1515  piperacillin-tazobactam (ZOSYN) IVPB 3.375 g  Status:  Discontinued        3.375 g 100 mL/hr over 30 Minutes Intravenous Every 6 hours 05/01/22 1428 05/06/22 1438   04/30/22 1400  piperacillin-tazobactam (ZOSYN) IVPB 2.25 g  Status:  Discontinued        2.25 g 100 mL/hr over 30 Minutes Intravenous Every 8 hours 04/30/22 1325 05/01/22 1426   04/29/22 1530  piperacillin-tazobactam (ZOSYN) IVPB 3.375  g  Status:  Discontinued        3.375 g 12.5 mL/hr over 240 Minutes Intravenous Every 8 hours 04/29/22 1433 04/30/22 1325   04/29/22 1145  metroNIDAZOLE (FLAGYL) IVPB 500 mg  Status:  Discontinued        500 mg 100 mL/hr over 60 Minutes Intravenous Every 12 hours 04/29/22 1047 04/29/22 1433   04/29/22 1145  vancomycin (VANCOCIN) IVPB 1000 mg/200 mL premix  Status:  Discontinued        1,000 mg 200 mL/hr over 60 Minutes Intravenous  Once 04/29/22 1047 04/29/22 1055   04/29/22 1145  vancomycin (VANCOREADY) IVPB 1500 mg/300 mL  Status:  Discontinued        1,500 mg 150 mL/hr over 120 Minutes Intravenous Every 24 hours 04/29/22 1055 04/29/22 1357   04/29/22 1045  ceFEPIme (MAXIPIME) 2 g in sodium chloride 0.9 % 100 mL IVPB  Status:  Discontinued        2 g 200 mL/hr over 30 Minutes Intravenous Every 8 hours 04/29/22 0954 04/29/22 1433   04/23/22 0730  ceFAZolin (ANCEF) IVPB 2g/100 mL premix        2 g 200 mL/hr over 30 Minutes Intravenous On call to O.R. 04/23/22 0726 04/23/22 0828       Assessment/Plan: Ms. Rothschild is s/p robotic incisional hernia repair on 04/23/22 -Postop ileus complicated by aspiration: Ileus resolved, having bowel function.   - Inflammatory enteritis likely due to ischemic insult with dilated bowel at time of high pressor support - Abdomen benign on exam. - WBC normal - reglan, TF resumed 7pm yesterday   Acute metabolic encephalopathy superimposed on hx bipolar disorder, schizophrenia   ICU delirium- improved now off all IV infusions. Acute biventricular HFrEF (Etiology sepsis, stress induced CM. Has clean coronaries) Cardiogenic shock resolved, EF improved.  Acute hypoxic respiratory failure Aspiration PNA Pulm edema   RLL VAP  AKI with oliguria; no baseline CKD  Anemia, acute on chronic  DM2 with hyperglycemia  Severe protein calorie malnutrition  Pressure injury lip - improving with local care and trach [place] with  multiple issues  Dispo - CM consult  LOS: 25 days    Georganna Skeans, MD, MPH, FACS Trauma & General Surgery Use [email or web address] to contact on call provider  05/19/2022

## 2022-05-19 NOTE — Progress Notes (Addendum)
Patient ID: Patricia [name], female   DOB: 1971-04-10, 51 y.o.   MRN: [id number] S: Is somewhat uncomfortable in bed but otherwise has no complaints. O:BP 125/79   Pulse (!) 102   Temp 100 F (37.8 C) (Axillary)   Resp (!) 26   Ht '5\' 4"'$  (1.626 m)   Wt 108 kg Comment: Post-hemo per prior.  LMP 07/07/2014 (Approximate)   SpO2 100%   BMI 40.87 kg/m   Intake/Output Summary (Last 24 hours) at 05/19/2022 1124 Last data filed at 05/19/2022 1015 Gross per 24 hour  Intake 495 ml  Output 0 ml  Net 495 ml   Intake/Output: I/O last 3 completed shifts: In: 1580 [I.V.:70; Other:30; NG/GT:1480] Out: 3001 [Other:3000; Stool:1]  Intake/Output this shift:  Total I/O In: 30 [I.V.:30] Out: -  Weight change:  Gen:on vent via trach, awake CVS: Tachycardia Resp:Ventilated, bilateral chest rise Abd: +BS, soft, NT Ext: Trace edema in the bilateral hands with legs Neuro: awake  Recent Labs  Lab 05/16/22 0407 05/16/22 1655 05/17/22 0459 05/17/22 1640 05/18/22 0500 05/18/22 1553 05/19/22 0345  NA 134* 134* 134* 132* 133* 132* 133*  K 4.7 5.2* 5.8* 5.3* 4.9 4.9 5.3*  CL 96* 97* 97* 96* 95* 93* 92*  CO2 '25 25 22 25 26 25 23  '$ GLUCOSE 132* 132* 127* 112* 113* 105* 121*  BUN 73* 101* 120* 122* 85* 105* 122*  CREATININE 4.51* 5.49* 6.61* 6.16* 5.17* 6.08* 7.08*  ALBUMIN 1.6* 1.6* 1.6* 1.8* 2.0* 2.0* 1.9*  CALCIUM 8.2* 8.2* 8.1* 8.4* 8.5* 8.6* 8.9  PHOS 6.6* 7.4* 7.9* 6.7* 5.9* 7.5* 8.2*   Liver Function Tests: Recent Labs  Lab 05/18/22 0500 05/18/22 1553 05/19/22 0345  ALBUMIN 2.0* 2.0* 1.9*   No results for input(s): "LIPASE", "AMYLASE" in the last 168 hours. No results for input(s): "AMMONIA" in the last 168 hours. CBC: Recent Labs  Lab 05/15/22 0500 05/16/22 0407 05/17/22 0459 05/18/22 0500 05/19/22 0345  WBC 6.7 6.2 5.8 5.6 5.0  HGB 6.9* 7.7* 6.8* 7.9* 8.0*  HCT 20.9* 23.1* 21.4* 23.7* 23.7*  MCV 85.7 84.0 85.9 84.6 84.3  PLT 241 271 269 279 303   Cardiac  Enzymes: No results for input(s): "CKTOTAL", "CKMB", "CKMBINDEX", "TROPONINI" in the last 168 hours. CBG: Recent Labs  Lab 05/18/22 1620 05/18/22 1956 05/18/22 2330 05/19/22 0431 05/19/22 0719  GLUCAP 98 110* 109* 112* 130*    Iron Studies:  No results for input(s): "IRON", "TIBC", "TRANSFERRIN", "FERRITIN" in the last 72 hours.  Studies/Results: No results found.  sodium chloride   Intravenous Once   Chlorhexidine Gluconate Cloth  6 each Topical Q0600   clonazePAM  0.5 mg Per Tube QHS   darbepoetin (ARANESP) injection - DIALYSIS  100 mcg Intravenous Q Fri-HD   feeding supplement (PROSource TF20)  60 mL Per Tube BID   fiber  1 packet Per Tube BID   heparin injection (subcutaneous)  5,000 Units Subcutaneous Q8H   HYDROmorphone HCl  1 mg Per Tube Q4H   Followed by   [START ON 05/22/2022] HYDROmorphone HCl  0.5 mg Per Tube Q4H   insulin aspart  0-20 Units Subcutaneous Q4H   insulin aspart  4 Units Subcutaneous Q4H   metoCLOPramide (REGLAN) injection  10 mg Intravenous Q6H   metoprolol tartrate  12.5 mg Per Tube BID   multivitamin  1 tablet Per Tube QHS   neomycin-bacitracin-polymyxin   Topical BID   nutrition supplement (JUVEN)  1 packet Per Tube BID BM   mouth rinse  15 mL Mouth Rinse Q2H   pantoprazole  40 mg Per Tube Daily   QUEtiapine  50 mg Per Tube BID   sodium chloride flush  10-40 mL Intracatheter Q12H    BMET    Component Value Date/Time   NA 133 (L) 05/19/2022 0345   NA 139 07/02/2014 1923   K 5.3 (H) 05/19/2022 0345   K 3.9 07/02/2014 1923   CL 92 (L) 05/19/2022 0345   CL 105 07/02/2014 1923   CO2 23 05/19/2022 0345   CO2 26 07/02/2014 1923   GLUCOSE 121 (H) 05/19/2022 0345   GLUCOSE 95 07/02/2014 1923   BUN 122 (H) 05/19/2022 0345   BUN 7 07/02/2014 1923   CREATININE 7.08 (H) 05/19/2022 0345   CREATININE 0.98 07/02/2014 1923   CALCIUM 8.9 05/19/2022 0345   CALCIUM 8.5 07/02/2014 1923   GFRNONAA 6 (L) 05/19/2022 0345   GFRNONAA >60 07/02/2014 1923    GFRAA >60 04/22/2018 2215   GFRAA >60 07/02/2014 1923   CBC    Component Value Date/Time   WBC 5.0 05/19/2022 0345   RBC 2.81 (L) 05/19/2022 0345   HGB 8.0 (L) 05/19/2022 0345   HGB 9.4 (L) 07/02/2014 1923   HCT 23.7 (L) 05/19/2022 0345   HCT 30.3 (L) 07/02/2014 1923   PLT 303 05/19/2022 0345   PLT 449 (H) 07/02/2014 1923   MCV 84.3 05/19/2022 0345   MCV 71 (L) 07/02/2014 1923   MCH 28.5 05/19/2022 0345   MCHC 33.8 05/19/2022 0345   RDW 15.5 05/19/2022 0345   RDW 18.8 (H) 07/02/2014 1923   LYMPHSABS 2.0 05/07/2022 0428   MONOABS 2.4 (H) 05/07/2022 0428   EOSABS 0.0 05/07/2022 0428   BASOSABS 0.1 05/07/2022 0428   Assessment/Plan:   AKI - likely ATN in the setting of cardiogenic and septic shock. CRRT 10/4-10/14/23 via RIJ temp HD catheter. First IHD 10/16.  No signs of recovery at this time.  Failed diuretic challenge Will keep HD on MWF schedule. S/p [facility] 10/18 Avoid nephrotoxic medications including NSAIDs and iodinated intravenous contrast exposure unless the latter is absolutely indicated.  Preferred narcotic agents for pain control are hydromorphone, fentanyl, and methadone. Morphine should not be used. Avoid Baclofen and avoid oral sodium phosphate and magnesium citrate based laxatives / bowel preps. Continue strict Input and Output monitoring. Will monitor the patient closely with you and intervene or adjust therapy as indicated by changes in clinical status/labs  Cardiogenic/septic shock - off pressors now.  Heart failure team had been following. Likely due to aspiration pneumonia. Acute hypoxic respiratory failure - was re-intubated 05/05/22.  Extubated again on 10/14 but failed BiPAP, s/p trach and back on Vent 05/11/22 per PCCM. TC trials per CCM Infectious enteritis - seen on CT scan.  Management per CCM/surgery Anemia of critical illness - transfuse for Hgb <7. Requiring PRBCs. Per primary. ESA started 10/18 Acute MI - normal coronaries by cath 10/4 Hyperkalemia:  ongoing issue. Preemptive lokelma over the weekend Thrombocytopenia - improved/resolved Hyperphosphatemia: Receiving continuous feeds so binders of little efficacy.  Suggest decreasing phosphorus in feeds as able S/p robotic incisional hernia repair with mesh and LOA - Surgery following.

## 2022-05-19 NOTE — Progress Notes (Addendum)
I called pt.'s sister Brayton Layman called this morning to update her on Curtis's new room assignment. No answer at this time. Left messages on machine stating "Patricia [name] is doing well, but has been relocated to another intensive care unit within the hospital. Please call back for further updates."

## 2022-05-20 ENCOUNTER — Inpatient Hospital Stay (HOSPITAL_COMMUNITY): Payer: Medicaid Other

## 2022-05-20 DIAGNOSIS — K469 Unspecified abdominal hernia without obstruction or gangrene: Secondary | ICD-10-CM | POA: Diagnosis not present

## 2022-05-20 DIAGNOSIS — J9601 Acute respiratory failure with hypoxia: Secondary | ICD-10-CM | POA: Diagnosis not present

## 2022-05-20 LAB — RENAL FUNCTION PANEL
Albumin: 1.8 g/dL — ABNORMAL LOW (ref 3.5–5.0)
Albumin: 1.8 g/dL — ABNORMAL LOW (ref 3.5–5.0)
Anion gap: 16 — ABNORMAL HIGH (ref 5–15)
Anion gap: 18 — ABNORMAL HIGH (ref 5–15)
BUN: 158 mg/dL — ABNORMAL HIGH (ref 6–20)
BUN: 171 mg/dL — ABNORMAL HIGH (ref 6–20)
CO2: 22 mmol/L (ref 22–32)
CO2: 23 mmol/L (ref 22–32)
Calcium: 8.4 mg/dL — ABNORMAL LOW (ref 8.9–10.3)
Calcium: 8.6 mg/dL — ABNORMAL LOW (ref 8.9–10.3)
Chloride: 92 mmol/L — ABNORMAL LOW (ref 98–111)
Chloride: 91 mmol/L — ABNORMAL LOW (ref 98–111)
Creatinine, Ser: 8.64 mg/dL — ABNORMAL HIGH (ref 0.44–1.00)
Creatinine, Ser: 9 mg/dL — ABNORMAL HIGH (ref 0.44–1.00)
GFR, Estimated: 5 mL/min — ABNORMAL LOW (ref >60)
GFR, Estimated: 5 mL/min — ABNORMAL LOW (ref >60)
Glucose, Bld: 100 mg/dL — ABNORMAL HIGH (ref 70–99)
Glucose, Bld: 119 mg/dL — ABNORMAL HIGH (ref 70–99)
Phosphorus: 9.3 mg/dL — ABNORMAL HIGH (ref 2.5–4.6)
Phosphorus: 9.7 mg/dL — ABNORMAL HIGH (ref 2.5–4.6)
Potassium: 6.2 mmol/L — ABNORMAL HIGH (ref 3.5–5.1)
Potassium: 5.4 mmol/L — ABNORMAL HIGH (ref 3.5–5.1)
Sodium: 130 mmol/L — ABNORMAL LOW (ref 135–145)
Sodium: 132 mmol/L — ABNORMAL LOW (ref 135–145)

## 2022-05-20 LAB — GLUCOSE, CAPILLARY
Glucose-Capillary: 111 mg/dL — ABNORMAL HIGH (ref 70–99)
Glucose-Capillary: 92 mg/dL (ref 70–99)
Glucose-Capillary: 115 mg/dL — ABNORMAL HIGH (ref 70–99)
Glucose-Capillary: 113 mg/dL — ABNORMAL HIGH (ref 70–99)
Glucose-Capillary: 119 mg/dL — ABNORMAL HIGH (ref 70–99)
Glucose-Capillary: 145 mg/dL — ABNORMAL HIGH (ref 70–99)

## 2022-05-20 LAB — MAGNESIUM: Magnesium: 2.6 mg/dL — ABNORMAL HIGH (ref 1.7–2.4)

## 2022-05-20 MED ORDER — ALBUMIN HUMAN 25 % IV SOLN
INTRAVENOUS | Status: AC
  Filled 2022-05-20: qty 100

## 2022-05-20 MED ORDER — CALCIUM GLUCONATE-NACL 1-0.675 GM/50ML-% IV SOLN
1.0000 g | Freq: Once | INTRAVENOUS | Status: AC
  Administered 2022-05-20: 1000 mg via INTRAVENOUS
  Filled 2022-05-20: qty 50

## 2022-05-20 MED ORDER — SODIUM BICARBONATE 8.4 % IV SOLN
50.0000 meq | Freq: Once | INTRAVENOUS | Status: AC
  Administered 2022-05-20: 50 meq via INTRAVENOUS
  Filled 2022-05-20: qty 50

## 2022-05-20 MED ORDER — HEPARIN SODIUM (PORCINE) 1000 UNIT/ML IJ SOLN
INTRAMUSCULAR | Status: AC
  Filled 2022-05-20: qty 4

## 2022-05-20 MED ORDER — SODIUM ZIRCONIUM CYCLOSILICATE 10 G PO PACK
10.0000 g | PACK | Freq: Every day | ORAL | Status: DC
  Administered 2022-05-20: 10 g

## 2022-05-20 MED ORDER — HYDROMORPHONE HCL 1 MG/ML IJ SOLN
0.5000 mg | Freq: Four times a day (QID) | INTRAMUSCULAR | Status: DC | PRN
  Administered 2022-05-20 – 2022-05-22 (×9): 0.5 mg via INTRAVENOUS
  Filled 2022-05-20 (×10): qty 1

## 2022-05-20 MED ORDER — SODIUM ZIRCONIUM CYCLOSILICATE 10 G PO PACK
10.0000 g | PACK | Freq: Every day | ORAL | Status: DC
  Filled 2022-05-20: qty 1

## 2022-05-20 MED ORDER — SODIUM ZIRCONIUM CYCLOSILICATE 10 G PO PACK
10.0000 g | PACK | Freq: Two times a day (BID) | ORAL | Status: DC
  Administered 2022-05-20 – 2022-05-21 (×3): 10 g
  Filled 2022-05-20 (×4): qty 1

## 2022-05-20 NOTE — Progress Notes (Signed)
RT placed pt on ATC 8l/35% and is tolerating well. Rt will monitor.

## 2022-05-20 NOTE — TOC Progression Note (Signed)
Transition of Care [facility]) - Progression Note    Patient Details  Name: Patricia [name] MRN: [id number] Date of Birth: [birth year]  [facility] CM/SW [place], [place] Phone Number: 05/20/2022, 4:29 PM  Clinical Narrative:    CSW unaware of dialysis Vent snfs at this time as the 3 in [place] do not. Will consult with leadership on out of state options.     Expected Discharge Plan: [place] Barriers to Discharge: Continued Medical Work up  Expected Discharge Plan and Services Expected Discharge Plan: [place]   Discharge Planning Services: CM Consult   Living arrangements for the past 2 months: Single Family Home                                       Social Determinants of Health (SDOH) Interventions    Readmission Risk Interventions     No data to display

## 2022-05-20 NOTE — Progress Notes (Signed)
Occupational Therapy Treatment Patient Details Name: Patricia [name] MRN: [id number] DOB: 1970/12/31 Today's Date: 05/20/2022   History of present illness 51 yo female admitted 04/23/22 for excisional hernia repair with mesh, ileus post op on TNA. Developed vomiting and hypotension after NGT removed 04/29/22 and PNA on CXR, was tachycardic. DX with sepsis and ABX started. Pt admitted to ICU on 10/2 on levophed. 10/3 intubated for worsening metabolic/lactic acidosis. 10/4 cardiac cath with EF 15%; She had AKI as well. CRRT 10/5-10/14. Failed extubation 10/8 and 10/14 with trach 10/14. PMhx: depression, OSA, asthma, schizophrenia, DM, insomnia   OT comments  Pt currently still mod +2 assist for bed level toileting and for supine to sit.  Mod assist for static/dynamic sitting balance EOB secondary to LOB to the right and posteriorly.  Able to tolerate sitting EOB for 18 mins overall with sats at 94-100% on 35% trach collar at 8Ls.  Respirations at 24-30 with activity and HR from 100-110.  Unable to stand this session but did complete scooting up the [facility] with total assist from one therapist.  Feel pt will continue to benefit from acute care OT at this time.  Will continue to follow.    Recommendations for follow up therapy are one component of a multi-disciplinary discharge planning process, led by the attending physician.  Recommendations may be updated based on patient status, additional functional criteria and insurance authorization.    Follow Up Recommendations  OT at Long-term acute care hospital    Assistance Recommended at Discharge Frequent or constant Supervision/Assistance  Patient can return home with the following  Two people to help with walking and/or transfers;Two people to help with bathing/dressing/bathroom;Assist for transportation;Help with stairs or ramp for entrance;Assistance with cooking/housework;Direct supervision/assist for medications management   Equipment  Recommendations  Other (comment) (TBD next venue of care)       Precautions / Restrictions Precautions Precautions: Fall Precaution Comments: trach, cortrak Restrictions Weight Bearing Restrictions: No       Mobility Bed Mobility Overal bed mobility: Needs Assistance Bed Mobility: Rolling, Sidelying to Sit, Sit to Supine Rolling: Mod assist, +2 for physical assistance Sidelying to sit: Mod assist, +2 for physical assistance   Sit to supine: Mod assist, +2 for safety/equipment   General bed mobility comments: Pt needed assist for rolling to the side and transitioning LEs off of the bed while lifting trunk up to sitting.  She needed assist with all aspects of transition back to supine.    Transfers                         Balance Overall balance assessment: Needs assistance   Sitting balance-Leahy Scale: Poor Sitting balance - Comments: LOB to the right and posteriorly when sitting EOB, needing mod assist overall to maintain                                   ADL either performed or assessed with clinical judgement   ADL                               Toileting- Clothing Manipulation and Hygiene: Maximal assistance;+2 for physical assistance;+2 for safety/equipment;Bed level Toileting - Clothing Manipulation Details (indicate cue type and reason): clean up after bowel incontinence     Functional mobility during ADLs: +2 for safety/equipment;Moderate assistance (rolling and transitions  to sitting EOB and back to supine) General ADL Comments: Pt with mod +2 for rolling side to side in bed to manage toileting tasks after slight bowel incontinence.  She was able to complete sitting EOB with overall mod assist secondary to LOB to the right and posteriorly.  HR maintained from 100 BPM up to 110 with Oxygen at 94-100 on 8Ls at 35% trach collar.  Respirations from 24 up to 30.  Attempted sit to stand X 1 but pt only able to achieve 50% for scooting  up toward the top of the bed.  She was able to sit EOB for over 18 mins total.      Cognition Arousal/Alertness: Awake/alert Behavior During Therapy: Flat affect Overall Cognitive Status: Difficult to assess (secondary to trach)                                 General Comments: Pt able to mouth correct month and day of the week.  Follow one step functional commands for balance, rolling, and scooting in the bed.                   Pertinent Vitals/ Pain       Pain Assessment Pain Assessment: Faces Faces Pain Scale: Hurts a little bit Pain Location: left hip/knee with flexion during rolling and bed mobility Pain Intervention(s): Limited activity within patient's tolerance, Monitored during session, Repositioned         Frequency  Min 2X/week        Progress Toward Goals  OT Goals(current goals can now be found in the care plan section)  Progress towards OT goals: Progressing toward goals  Acute Rehab OT Goals Patient Stated Goal: Pt did not state Time For Goal Achievement: 05/27/22 Potential to Achieve Goals: Good  Plan Discharge plan remains appropriate       AM-PAC OT "6 Clicks" Daily Activity     Outcome Measure   Help from another person eating meals?: Total Help from another person taking care of personal grooming?: A Lot Help from another person toileting, which includes using toliet, bedpan, or urinal?: Total Help from another person bathing (including washing, rinsing, drying)?: Total Help from another person to put on and taking off regular upper body clothing?: Total Help from another person to put on and taking off regular lower body clothing?: Total 6 Click Score: 7    End of Session Equipment Utilized During Treatment: Oxygen  OT Visit Diagnosis: Unsteadiness on feet (R26.81);Other abnormalities of gait and mobility (R26.89);Muscle weakness (generalized) (M62.81)   Activity Tolerance Patient tolerated treatment well   Patient  Left in bed;with call bell/phone within reach;with bed alarm set   Nurse Communication Mobility status;Other (comment) (pt using bed pan)        Time: 1355-1436 OT Time Calculation (min): 41 min  Charges: OT General Charges $OT Visit: 1 Visit OT Treatments $Self Care/Home Management : 38-52 mins  Nakeyia Menden OTR/L 05/20/2022, 2:52 PM

## 2022-05-20 NOTE — Progress Notes (Signed)
Patient ID: Patricia [name], female   DOB: 1970/08/28, 51 y.o.   MRN: [id number] 19 Days Post-Op    Subjective: Trach collar Vomiting over weekend, now tube feeds running post-pyloric Objective: Vital signs in last 24 hours: Temp:  [98.3 F (36.8 C)-99.6 F (37.6 C)] 98.5 F (36.9 C) (10/23 1200) Pulse Rate:  [90-113] 92 (10/23 1300) Resp:  [15-44] 20 (10/23 1300) BP: (112-143)/(69-92) 122/77 (10/23 1300) SpO2:  [92 %-100 %] 100 % (10/23 1300) FiO2 (%):  [30 %-35 %] 35 % (10/23 1121) Weight:  [111.1 kg] 111.1 kg (10/23 0600) Last BM Date : (P) 05/19/22  Intake/Output from previous day: 10/22 0701 - 10/23 0700 In: 990 [I.V.:40; NG/GT:950] Out: -  Intake/Output this shift: Total I/O In: 375.1 [NG/GT:325; IV Piggyback:50.1] Out: 800 [Urine:800]  General appearance: cooperative Neck: trach GI: soft, NT  Lab Results: CBC  Recent Labs    05/18/22 0500 05/19/22 0345  WBC 5.6 5.0  HGB 7.9* 8.0*  HCT 23.7* 23.7*  PLT 279 303    BMET Recent Labs    05/19/22 1521 05/20/22 0637  NA 129* 130*  K 5.4* 6.2*  CL 93* 92*  CO2 24 22  GLUCOSE 123* 100*  BUN 136* 158*  CREATININE 7.69* 8.64*  CALCIUM 8.3* 8.4*    PT/INR No results for input(s): "LABPROT", "INR" in the last 72 hours.  ABG No results for input(s): "PHART", "HCO3" in the last 72 hours.  Invalid input(s): "PCO2", "PO2"  Studies/Results: DG Chest Port 1 View  Result Date: 05/20/2022 CLINICAL DATA:  [id number], [id number]. Aspiration. Feeding tube placement. EXAM: ABDOMEN - 1 VIEW; PORTABLE CHEST - 1 VIEW COMPARISON:  Abdomen film 05/10/2022, portable chest 05/11/2022 FINDINGS: AP portable chest 2:47 a.m.: There are multiple overlying monitor wires. The patient is rotated somewhat to the right. Interval removal prior right PICC. There is now a right IJ double-lumen catheter in place with the tip in the upper right atrium. Tracheostomy cannula tip is 5.8 cm from the carina with feeding tube entering the  stomach. Low inspiration is again noted with atelectatic bands in the bases. No focal pneumonia is evident but there is limited view of the bases due to low lung volumes. Cardiomegaly.  The central vessels are normal caliber. Aortic tortuosity and ectasia again noted with stable mediastinum. No acute skeletal findings. Single view of the abdomen 2:48 a.m.: The pelvic bowel was not included. Image detail is limited due to habitus. Feeding tube courses through the stomach into the duodenum and just past the ligament of Treitz in the proximal jejunum. There is increased dilatation of mid abdominal small bowel with 2 dilated segments up to 4.6 cm, unknown whether due to partial obstruction or ileus. Rest of the visualized bowel is normal caliber. Visceral shadows are obscured due to body habitus. IMPRESSION: 1. Low lung volumes with basilar atelectatic bands. No convincing pneumonia. 2. Support devices as above. The tip of the feeding tube present in the most proximal jejunum. 3. Increased dilatation of 2 mid abdominal small bowel segments up to 4.6 cm, unknown if this is due to ileus or partial small bowel obstruction. Consider CT follow-up. 4. Cardiomegaly. Electronically Signed   By: Telford Nab M.D.   On: 05/20/2022 03:20   DG Abd 1 View  Result Date: 05/20/2022 CLINICAL DATA:  [id number], [id number]. Aspiration. Feeding tube placement. EXAM: ABDOMEN - 1 VIEW; PORTABLE CHEST - 1 VIEW COMPARISON:  Abdomen film 05/10/2022, portable chest 05/11/2022 FINDINGS: AP portable chest 2:47 a.m.: There are  multiple overlying monitor wires. The patient is rotated somewhat to the right. Interval removal prior right PICC. There is now a right IJ double-lumen catheter in place with the tip in the upper right atrium. Tracheostomy cannula tip is 5.8 cm from the carina with feeding tube entering the stomach. Low inspiration is again noted with atelectatic bands in the bases. No focal pneumonia is evident but there is limited view of  the bases due to low lung volumes. Cardiomegaly.  The central vessels are normal caliber. Aortic tortuosity and ectasia again noted with stable mediastinum. No acute skeletal findings. Single view of the abdomen 2:48 a.m.: The pelvic bowel was not included. Image detail is limited due to habitus. Feeding tube courses through the stomach into the duodenum and just past the ligament of Treitz in the proximal jejunum. There is increased dilatation of mid abdominal small bowel with 2 dilated segments up to 4.6 cm, unknown whether due to partial obstruction or ileus. Rest of the visualized bowel is normal caliber. Visceral shadows are obscured due to body habitus. IMPRESSION: 1. Low lung volumes with basilar atelectatic bands. No convincing pneumonia. 2. Support devices as above. The tip of the feeding tube present in the most proximal jejunum. 3. Increased dilatation of 2 mid abdominal small bowel segments up to 4.6 cm, unknown if this is due to ileus or partial small bowel obstruction. Consider CT follow-up. 4. Cardiomegaly. Electronically Signed   By: Telford Nab M.D.   On: 05/20/2022 03:20    Anti-infectives: Anti-infectives (From admission, onward)    Start     Dose/Rate Route Frequency Ordered Stop   05/15/22 1445  ceFAZolin (ANCEF) IVPB 2g/100 mL premix       Note to Pharmacy: To be given in IR   2 g 200 mL/hr over 30 Minutes Intravenous  Once 05/15/22 1352 05/15/22 1616   05/15/22 1230  ceFAZolin (ANCEF) powder 1 g  Status:  Discontinued        1 g Other To Surgery 05/15/22 1140 05/15/22 1239   05/15/22 1230  ceFAZolin (ANCEF) IVPB 2g/100 mL premix        2 g 200 mL/hr over 30 Minutes Intravenous To Radiology 05/15/22 1140 05/15/22 1140   05/15/22 0000  ceFAZolin (ANCEF) IVPB 2g/100 mL premix        2 g 200 mL/hr over 30 Minutes Intravenous To Radiology 05/14/22 1308 05/15/22 0005   05/13/22 2134  meropenem (MERREM) 500 mg in sodium chloride 0.9 % 100 mL IVPB        500 mg 200 mL/hr over 30  Minutes Intravenous Every 24 hours 05/13/22 0815 05/17/22 0700   05/13/22 1000  meropenem (MERREM) 500 mg in sodium chloride 0.9 % 100 mL IVPB  Status:  Discontinued        500 mg 200 mL/hr over 30 Minutes Intravenous Every 12 hours 05/12/22 0940 05/12/22 0946   05/12/22 2200  meropenem (MERREM) 500 mg in sodium chloride 0.9 % 100 mL IVPB  Status:  Discontinued        500 mg 200 mL/hr over 30 Minutes Intravenous Every 12 hours 05/12/22 0946 05/13/22 0815   05/12/22 2000  meropenem (MERREM) 1 g in sodium chloride 0.9 % 100 mL IVPB  Status:  Discontinued        1 g 200 mL/hr over 30 Minutes Intravenous  Once 05/12/22 0940 05/12/22 0946   05/06/22 1530  meropenem (MERREM) 1 g in sodium chloride 0.9 % 100 mL IVPB  Status:  Discontinued        1 g 200 mL/hr over 30 Minutes Intravenous Every 8 hours 05/06/22 1438 05/12/22 0940   05/04/22 1000  vancomycin (VANCOCIN) IVPB 1000 mg/200 mL premix  Status:  Discontinued        1,000 mg 200 mL/hr over 60 Minutes Intravenous Every 24 hours 05/03/22 0802 05/08/22 0913   05/03/22 0845  vancomycin (VANCOREADY) IVPB 2000 mg/400 mL        2,000 mg 200 mL/hr over 120 Minutes Intravenous  Once 05/03/22 0751 05/03/22 1114   05/01/22 1800  piperacillin-tazobactam (ZOSYN) IVPB 3.375 g  Status:  Discontinued        3.375 g 12.5 mL/hr over 240 Minutes Intravenous Every 6 hours 05/01/22 1426 05/01/22 1427   05/01/22 1515  piperacillin-tazobactam (ZOSYN) IVPB 3.375 g  Status:  Discontinued        3.375 g 100 mL/hr over 30 Minutes Intravenous Every 6 hours 05/01/22 1428 05/06/22 1438   04/30/22 1400  piperacillin-tazobactam (ZOSYN) IVPB 2.25 g  Status:  Discontinued        2.25 g 100 mL/hr over 30 Minutes Intravenous Every 8 hours 04/30/22 1325 05/01/22 1426   04/29/22 1530  piperacillin-tazobactam (ZOSYN) IVPB 3.375 g  Status:  Discontinued        3.375 g 12.5 mL/hr over 240 Minutes Intravenous Every 8 hours 04/29/22 1433 04/30/22 1325   04/29/22 1145   metroNIDAZOLE (FLAGYL) IVPB 500 mg  Status:  Discontinued        500 mg 100 mL/hr over 60 Minutes Intravenous Every 12 hours 04/29/22 1047 04/29/22 1433   04/29/22 1145  vancomycin (VANCOCIN) IVPB 1000 mg/200 mL premix  Status:  Discontinued        1,000 mg 200 mL/hr over 60 Minutes Intravenous  Once 04/29/22 1047 04/29/22 1055   04/29/22 1145  vancomycin (VANCOREADY) IVPB 1500 mg/300 mL  Status:  Discontinued        1,500 mg 150 mL/hr over 120 Minutes Intravenous Every 24 hours 04/29/22 1055 04/29/22 1357   04/29/22 1045  ceFEPIme (MAXIPIME) 2 g in sodium chloride 0.9 % 100 mL IVPB  Status:  Discontinued        2 g 200 mL/hr over 30 Minutes Intravenous Every 8 hours 04/29/22 0954 04/29/22 1433   04/23/22 0730  ceFAZolin (ANCEF) IVPB 2g/100 mL premix        2 g 200 mL/hr over 30 Minutes Intravenous On call to O.R. 04/23/22 0726 04/23/22 0828       Assessment/Plan: Ms. Biddinger is s/p robotic incisional hernia repair on 04/23/22 -Postop ileus complicated by aspiration: Ileus resolved, having bowel function.   - Inflammatory enteritis likely due to ischemic insult with dilated bowel at time of high pressor support - still some small bowel dilation on x-ray - Abdomen benign on exam. Says pain is a little better today - WBC normal - reglan, TF as tolerated   Acute metabolic encephalopathy superimposed on hx bipolar disorder, schizophrenia   ICU delirium- improved now off all IV infusions. Acute biventricular HFrEF (Etiology sepsis, stress induced CM. Has clean coronaries) Cardiogenic shock resolved, EF improved.  Acute hypoxic respiratory failure Aspiration PNA Pulm edema   RLL VAP  AKI with oliguria; no baseline CKD  Anemia, acute on chronic  DM2 with hyperglycemia  Severe protein calorie malnutrition  Pressure injury lip - improving with local care and trach Deconditioning   Appreciate CCM Care with multiple issues  Dispo - CM consult  LOS: 26 days  Felicie Morn,  MD   05/20/2022

## 2022-05-20 NOTE — Progress Notes (Signed)
SLP Cancellation Note  Patient Details Name: Patricia [name] MRN: [id number] DOB: 1971-07-17   Cancelled treatment:       Reason Eval/Treat Not Completed: Medical issues which prohibited therapy. Pt with bilious vomiting overnight, on vent this am. Will f/u as able.     Osie Bond., M.A. [place] Office [phone]  Secure chat preferred  05/20/2022, 9:14 AM

## 2022-05-20 NOTE — Plan of Care (Signed)
  Problem: Education: Goal: Knowledge of General Education information will improve Description: Including pain rating scale, medication(s)/side effects and non-pharmacologic comfort measures Outcome: Progressing   Problem: Health Behavior/Discharge Planning: Goal: Ability to manage health-related needs will improve Outcome: Progressing   Problem: Clinical Measurements: Goal: Will remain free from infection Outcome: Progressing   Problem: Activity: Goal: Risk for activity intolerance will decrease Outcome: Progressing   Problem: Nutrition: Goal: Adequate nutrition will be maintained Outcome: Progressing   Problem: Coping: Goal: Level of anxiety will decrease Outcome: Progressing   Problem: Pain Managment: Goal: General experience of comfort will improve Outcome: Progressing   Problem: Skin Integrity: Goal: Risk for impaired skin integrity will decrease Outcome: Progressing   

## 2022-05-20 NOTE — Progress Notes (Signed)
eLink Physician-Brief Progress Note Patient Name: Patricia [name] DOB: 1971-01-03 MRN: [id number]   Date of Service  05/20/2022  HPI/Events of Note  Patient vomited, bilous, non-blody Similar output from trach Transient desaturation improved with suctioning Tube feeding held  eICU Interventions  CXR and KUB stat     Intervention Category Intermediate Interventions: Other:  Judd Lien 05/20/2022, 2:37 AM

## 2022-05-20 NOTE — Progress Notes (Signed)
Pt noted to have yellow/green (bile) emesis. While this was happening it was noted that pt had the same color of output from her trach. RR and HR elevated, O2 decreased. TF turned off, pt's mouth and trach suctioned. Elink informed. Pt  placed back on vet from tarch collar by RRT. See new orders.

## 2022-05-20 NOTE — Progress Notes (Signed)
Pursestring suture removed at bedside intact. Biodisk and sterile central line dressing placed.  No oozing/bleeding noted post suture removal.  RN aware that suture has been  removed.     Narda Rutherford, AGNP-BC 05/20/2022, 2:34 PM

## 2022-05-20 NOTE — TOC CM/SW Note (Signed)
HF TOC CM spoke to [facility] rep, Angie. Declined referral, SNF cannot support HD. [place], Heart Failure TOC CM [phone]

## 2022-05-20 NOTE — Progress Notes (Signed)
   05/20/22 2317  Vitals  Temp 97.8 F (36.6 C)  Temp Source Oral  BP (!) 129/97  MAP (mmHg) 106  BP Location Left Wrist  BP Method Automatic  Patient Position (if appropriate) Lying  Pulse Rate (!) 116  Pulse Rate Source Monitor  ECG Heart Rate (!) 117  Resp (!) 23  Oxygen Therapy  SpO2 98 %  O2 Device Tracheostomy Collar  Patient Activity (if Appropriate) In bed  Pulse Oximetry Type Continuous  During Treatment Monitoring  Blood Flow Rate (mL/min) 200 mL/min  HD Safety Checks Performed Yes  Intra-Hemodialysis Comments Tx completed  Post Treatment  Dialyzer Clearance Lightly streaked  Duration of HD Treatment -hour(s) 4 hour(s)  Hemodialysis Intake (mL) 0 mL  Liters Processed 96  Fluid Removed 3000 mL  Tolerated HD Treatment Yes  Post-Hemodialysis Comments tx completed. no complications.  Hemodialysis Catheter Right Internal jugular Double lumen Permanent (Tunneled)  Placement Date/Time: 05/15/22 1154   Placed prior to admission: No  Serial / Lot #: [id number]  Expiration Date: 12/04/26  Time Out: Correct patient;Correct site;Correct procedure  Maximum sterile barrier precautions: Hand hygiene;Cap;Mask;Sterile gow...  Site Condition No complications  Blue Lumen Status Heparin locked  Red Lumen Status Heparin locked  Catheter fill solution Heparin 1000 units/ml  Catheter fill volume (Arterial) 1.6 cc  Catheter fill volume (Venous) 1.6  Dressing Type Transparent  Dressing Status Antimicrobial disc in place  Interventions New dressing  Drainage Description None  Dressing Change Due 05/27/22  Post treatment catheter status Capped and Clamped

## 2022-05-20 NOTE — Progress Notes (Signed)
RT called to Pt room to assess Pt for possible aspiration and destating while on ATC. RT noticed yellow bile coming from trach area, RR in the high 30s, sats 90% and WOB. RT suctioned Pt and listened to lungs, no notable bile while suctioning and clear bilateral breath sounds noted. CXR ordered. Pt placed back on full support for WOB. Pt is tolerating it well at this time, VS stable, stats 99%. RT will continue to monitor as needed.

## 2022-05-20 NOTE — Progress Notes (Signed)
NAME:  JACEY ECKERSON, MRN:  [id number], DOB:  September 09, 1970, LOS: 23 ADMISSION DATE:  04/23/2022, CONSULTATION DATE:  04/29/2022 REFERRING MD:  Felicie Morn, MD, CHIEF COMPLAINT:   Small bowel obstruction, aspiration event, sepsis, respiratory failure    History of Present Illness:  Ms. Hartzell with above hx and nuc study in 2015 neg for ischemia done for chest pain and echo with EF 60-65% presented to hospital for excisional hernia repair (robotic hernia repair 04/23/22) with mesh, ileus post op on TNA. Developed vomiting and hypotension after NG came out on 04/29/22 and PNA on CXR, was tachycardic and BP improved with IV fluids. DX with sepsis and ABX started. At one point prolonged Qtc. Pt admitted to ICU on 10/2.  Was placed on levophed. Early AM on 10/3 was intubated for worsening metabolic/lactic acidosis. She had AKI as well. Crt was 6.01, her normal 0.9. Today Cr is 4 on CRRT.  Pertinent  Medical History  DM2 Depression OSA Migraines  Asthma Obesity Schizophrenia HTN HLD  Significant Hospital Events: Including procedures, antibiotic start and stop dates in addition to other pertinent events   9/26 Robotic hernia repair complicated by abd adhesions.  10/2 CT + for developing SBO and bilat lower lobe opacities most likely aspiration PNA. Progressive decompensation requiring transfer to ICU 10/2 PCCM consulted for respiratory failure & shock 10\3 Intubated for resp failure. CVL and arterial line placed. 04/30/22: EF 30-35%, RV moderately reduced, mild to moderate MR, IVC collapsed throughout respiratory cycle 10\4 EKG changes/trop increased--> and taken to cath lab - No obstructive CAD, but in decompensated heart failure, Bedside echo with EF 15%, new WMA, moderate RV dysfunction, trivial pericardial effusion.underwent emergent L/RHC. Normal coronaries, RA mean 13, PA mean 29, LVEDP 16, PCWP 19, CO/CI 3.96/1.87   returned to CICU post-cath. Right heart cath placed right fem.  Right IJ HD cath placed.  10/5 advanced HF team following, shock supported on norepi/vasopressin and Dobutamine. On CRRT.  10/6 weaning Norepi. Starting to wean fent. Needing restraints.  10/8: Reintubated due to neuromuscular weakness.  Bedside echo demonstrating improved RV function, LV function remained depressed 10/9 CT abdomen pelvis.suggesting wall thickening throughout the proximal and mid small bowel consistent with possible infectious or inflammatory enteritis there is also bibasilar airspace disease which could reflect pneumonia.  Antibiotic coverage widened.  Zosyn changed to meropenem.  Tolerating trickle feeds still on TPN 10/10 stopped stress dose steroids.  Resumed subcutaneous heparin. 10/13 albumin for coox decrease. Weaning vent . Failed SBT. At goal TF. 10/14 extubated to bipap failed due to weakness, trached 10/15 trial off CRRT, lasix 10/22 transfer 4N as a lateral   Interim History / Subjective:   Patient had a bilious vomiting overnight, then she became tachypneic, switched back to full support mechanical ventilation Tube feed was stopped X-ray abdomen is suggestive of ileus  Objective   Blood pressure 135/88, pulse 90, temperature 98.5 F (36.9 C), temperature source Axillary, resp. rate 20, height '5\' 4"'$  (1.626 m), weight 111.1 kg, last menstrual period 07/07/2014, SpO2 100 %.    Vent Mode: PRVC FiO2 (%):  [30 %-35 %] 30 % Set Rate:  [20 bmp] 20 bmp Vt Set:  [460 mL] 460 mL PEEP:  [5 cmH20] 5 cmH20 Plateau Pressure:  [17 cmH20-19 cmH20] 17 cmH20   Intake/Output Summary (Last 24 hours) at 05/20/2022 0844 Last data filed at 05/20/2022 0200 Gross per 24 hour  Intake 940 ml  Output --  Net 940 ml   [employer]  05/17/22 1942 05/18/22 0500 05/20/22 0600  Weight: 108 kg 108 kg 111.1 kg    Examination: Physical exam: General: Acute on chronically ill-appearing obese female lying in bed status post trach HEENT: NCAT, tracking appropriately, tracheostomy  tube in place, moderate amount of thin secretions, easily cleared with suction catheter Neuro: Eyes open, following simple commands Chest: Diminished breath sounds bilaterally, some anterior rhonchi Heart: Regular rate rhythm, S1-S2 Abdomen: Soft, nontender nondistended, bowel sounds present Skin: No rash   Assessment & Plan:   Acute metabolic encephalopathy superimposed on hx bipolar disorder, schizophrenia   Mental status has improved Continue taper of Dilaudid over the next couple of days Continue clonazepam 0.5 mg nightly Continue Seroquel  Acute hypoxic respiratory failure d/t Aspiration PNA, (RLL) and c/b Pulmonary edema   Completed course of antibiotics Continue to monitor urine output and I's and O's. Dialysis with nephrology to maintain euvolemia  Acute biventricular HFrEF (Etiology sepsis, stress induced CM. Has clean coronaries) Cardiogenic shock resolved, EF improved.  Monitor I's and O's Mildly reduced ejection fraction, improved now with repeat echocardiogram, 50 to 55% Continue low-dose beta-blocker, 12.5 mg metoprolol twice daily Unable to tolerate ACE inhibitor in setting of recent AKI  AKI due to septic ATN, no renal recovery Hyponatremia Hyperkalemia Continue dialysis Monday Wednesday Friday Overnight sleep serum potassium went up, currently at 6.2 Received Lokelma We will give 1 amp of bicarbonate and calcium gluconate Continue Lokelma twice daily Monitor electrolytes  S/p hernia repair &  LOA inflammatory enteritis  Continue tube feeds Postop care by surgical team  Anemia, acute on chronic  Monitor for any signs of bleeding H&H stable Transfusion threshold for hemoglobin less than 7  DM2 with hyperglycemia  Now blood sugars are controlled Follow blood sugars, continue sliding scale insulin Goal CBG 140-180  Severe protein calorie malnutrition  Morbid obesity Continue tube feeds  Pressure injury- lip in the setting of ET tube: Not present  on admission Wound care team   Best Practice (right click and "Reselect all SmartList Selections" daily)   Diet/type: Restart trickle tube feeds DVT prophylaxis: prophylactic heparin  GI prophylaxis: PPI Lines: Central line and Dialysis Catheter Foley:  Yes, and it is still needed Code Status:  full code   Total critical care time: 32 minutes  Performed by: [place] care time was exclusive of separately billable procedures and treating other patients.   Critical care was necessary to treat or prevent imminent or life-threatening deterioration.   Critical care was time spent personally by me on the following activities: development of treatment plan with patient and/or surrogate as well as nursing, discussions with consultants, evaluation of patient's response to treatment, examination of patient, obtaining history from patient or surrogate, ordering and performing treatments and interventions, ordering and review of laboratory studies, ordering and review of radiographic studies, pulse oximetry and re-evaluation of patient's condition.   Jacky Kindle, MD [facility] See [place] for pager If no response to pager, please call [phone] until 7pm After 7pm, Please call E-link [phone]

## 2022-05-20 NOTE — Progress Notes (Signed)
Patient ID: Patricia [name], female   DOB: Mar 02, 1971, 51 y.o.   MRN: [id number] S: Is somewhat uncomfortable in bed but otherwise has no complaints.  For HD today.  RN did bladder scan 313m - pt declined I/O in favor of bedpan trial - encouraged to allow I/O cath if unable to urinate.  She's been anuric the past few days.  O:BP 135/88   Pulse 90   Temp 98.5 F (36.9 C) (Axillary)   Resp 20   Ht '5\' 4"'$  (1.626 m)   Wt 111.1 kg   LMP 07/07/2014 (Approximate)   SpO2 100%   BMI 42.04 kg/m   Intake/Output Summary (Last 24 hours) at 05/20/2022 0909 Last data filed at 05/20/2022 0200 Gross per 24 hour  Intake 890 ml  Output --  Net 890 ml    Intake/Output: I/O last 3 completed shifts: In: 1190 [I.V.:40; NG/GT:1150] Out: 0   Intake/Output this shift:  No intake/output data recorded. Weight change:  Gen:on vent via trach, awake CVS: RRR Resp:on TC, bilateral chest rise Abd: +BS, soft, NT Ext: Trace edema in the bilateral hands with legs Neuro: awake  Recent Labs  Lab 05/17/22 0459 05/17/22 1640 05/18/22 0500 05/18/22 1553 05/19/22 0345 05/19/22 1521 05/20/22 0637  NA 134* 132* 133* 132* 133* 129* 130*  K 5.8* 5.3* 4.9 4.9 5.3* 5.4* 6.2*  CL 97* 96* 95* 93* 92* 93* 92*  CO2 '22 25 26 25 23 24 22  '$ GLUCOSE 127* 112* 113* 105* 121* 123* 100*  BUN 120* 122* 85* 105* 122* 136* 158*  CREATININE 6.61* 6.16* 5.17* 6.08* 7.08* 7.69* 8.64*  ALBUMIN 1.6* 1.8* 2.0* 2.0* 1.9* 1.8* 1.8*  CALCIUM 8.1* 8.4* 8.5* 8.6* 8.9 8.3* 8.4*  PHOS 7.9* 6.7* 5.9* 7.5* 8.2* 8.7* 9.3*    Liver Function Tests: Recent Labs  Lab 05/19/22 0345 05/19/22 1521 05/20/22 0637  ALBUMIN 1.9* 1.8* 1.8*    No results for input(s): "LIPASE", "AMYLASE" in the last 168 hours. No results for input(s): "AMMONIA" in the last 168 hours. CBC: Recent Labs  Lab 05/15/22 0500 05/16/22 0407 05/17/22 0459 05/18/22 0500 05/19/22 0345  WBC 6.7 6.2 5.8 5.6 5.0  HGB 6.9* 7.7* 6.8* 7.9* 8.0*  HCT 20.9* 23.1*  21.4* 23.7* 23.7*  MCV 85.7 84.0 85.9 84.6 84.3  PLT 241 271 269 279 303    Cardiac Enzymes: No results for input(s): "CKTOTAL", "CKMB", "CKMBINDEX", "TROPONINI" in the last 168 hours. CBG: Recent Labs  Lab 05/19/22 1546 05/19/22 1911 05/19/22 2317 05/20/22 0307 05/20/22 0755  GLUCAP 127* 113* 121* 111* 92     Iron Studies:  No results for input(s): "IRON", "TIBC", "TRANSFERRIN", "FERRITIN" in the last 72 hours.  Studies/Results: DG Chest Port 1 View  Result Date: 05/20/2022 CLINICAL DATA:  [id number] Aspiration. Feeding tube placement. EXAM: ABDOMEN - 1 VIEW; PORTABLE CHEST - 1 VIEW COMPARISON:  Abdomen film 05/10/2022, portable chest 05/11/2022 FINDINGS: AP portable chest 2:47 a.m.: There are multiple overlying monitor wires. The patient is rotated somewhat to the right. Interval removal prior right PICC. There is now a right IJ double-lumen catheter in place with the tip in the upper right atrium. Tracheostomy cannula tip is 5.8 cm from the carina with feeding tube entering the stomach. Low inspiration is again noted with atelectatic bands in the bases. No focal pneumonia is evident but there is limited view of the bases due to low lung volumes. Cardiomegaly.  The central vessels are normal caliber. Aortic tortuosity and ectasia again noted with  stable mediastinum. No acute skeletal findings. Single view of the abdomen 2:48 a.m.: The pelvic bowel was not included. Image detail is limited due to habitus. Feeding tube courses through the stomach into the duodenum and just past the ligament of Treitz in the proximal jejunum. There is increased dilatation of mid abdominal small bowel with 2 dilated segments up to 4.6 cm, unknown whether due to partial obstruction or ileus. Rest of the visualized bowel is normal caliber. Visceral shadows are obscured due to body habitus. IMPRESSION: 1. Low lung volumes with basilar atelectatic bands. No convincing pneumonia. 2. Support devices as above.  The tip of the feeding tube present in the most proximal jejunum. 3. Increased dilatation of 2 mid abdominal small bowel segments up to 4.6 cm, unknown if this is due to ileus or partial small bowel obstruction. Consider CT follow-up. 4. Cardiomegaly. Electronically Signed   By: Telford Nab M.D.   On: 05/20/2022 03:20   DG Abd 1 View  Result Date: 05/20/2022 CLINICAL DATA:  [id number], [id number]. Aspiration. Feeding tube placement. EXAM: ABDOMEN - 1 VIEW; PORTABLE CHEST - 1 VIEW COMPARISON:  Abdomen film 05/10/2022, portable chest 05/11/2022 FINDINGS: AP portable chest 2:47 a.m.: There are multiple overlying monitor wires. The patient is rotated somewhat to the right. Interval removal prior right PICC. There is now a right IJ double-lumen catheter in place with the tip in the upper right atrium. Tracheostomy cannula tip is 5.8 cm from the carina with feeding tube entering the stomach. Low inspiration is again noted with atelectatic bands in the bases. No focal pneumonia is evident but there is limited view of the bases due to low lung volumes. Cardiomegaly.  The central vessels are normal caliber. Aortic tortuosity and ectasia again noted with stable mediastinum. No acute skeletal findings. Single view of the abdomen 2:48 a.m.: The pelvic bowel was not included. Image detail is limited due to habitus. Feeding tube courses through the stomach into the duodenum and just past the ligament of Treitz in the proximal jejunum. There is increased dilatation of mid abdominal small bowel with 2 dilated segments up to 4.6 cm, unknown whether due to partial obstruction or ileus. Rest of the visualized bowel is normal caliber. Visceral shadows are obscured due to body habitus. IMPRESSION: 1. Low lung volumes with basilar atelectatic bands. No convincing pneumonia. 2. Support devices as above. The tip of the feeding tube present in the most proximal jejunum. 3. Increased dilatation of 2 mid abdominal small bowel segments up to  4.6 cm, unknown if this is due to ileus or partial small bowel obstruction. Consider CT follow-up. 4. Cardiomegaly. Electronically Signed   By: Telford Nab M.D.   On: 05/20/2022 03:20    sodium chloride   Intravenous Once   Chlorhexidine Gluconate Cloth  6 each Topical Q0600   clonazePAM  0.5 mg Per Tube QHS   darbepoetin (ARANESP) injection - DIALYSIS  100 mcg Intravenous Q Fri-HD   feeding supplement (PROSource TF20)  60 mL Per Tube BID   fiber  1 packet Per Tube BID   heparin injection (subcutaneous)  5,000 Units Subcutaneous Q8H   HYDROmorphone HCl  1 mg Per Tube Q4H   Followed by   [START ON 05/22/2022] HYDROmorphone HCl  0.5 mg Per Tube Q4H   insulin aspart  0-20 Units Subcutaneous Q4H   insulin aspart  4 Units Subcutaneous Q4H   metoCLOPramide (REGLAN) injection  10 mg Intravenous Q6H   metoprolol tartrate  12.5 mg Per Tube BID  multivitamin  1 tablet Per Tube QHS   neomycin-bacitracin-polymyxin   Topical BID   nutrition supplement (JUVEN)  1 packet Per Tube BID BM   mouth rinse  15 mL Mouth Rinse 4 times per day   pantoprazole  40 mg Per Tube Daily   QUEtiapine  50 mg Per Tube BID   sodium bicarbonate  50 mEq Intravenous Once   sodium chloride flush  10-40 mL Intracatheter Q12H   sodium zirconium cyclosilicate  10 g Per Tube BID    BMET    Component Value Date/Time   NA 130 (L) 05/20/2022 0637   NA 139 07/02/2014 1923   K 6.2 (H) 05/20/2022 0637   K 3.9 07/02/2014 1923   CL 92 (L) 05/20/2022 0637   CL 105 07/02/2014 1923   CO2 22 05/20/2022 0637   CO2 26 07/02/2014 1923   GLUCOSE 100 (H) 05/20/2022 0637   GLUCOSE 95 07/02/2014 1923   BUN 158 (H) 05/20/2022 0637   BUN 7 07/02/2014 1923   CREATININE 8.64 (H) 05/20/2022 0637   CREATININE 0.98 07/02/2014 1923   CALCIUM 8.4 (L) 05/20/2022 0637   CALCIUM 8.5 07/02/2014 1923   GFRNONAA 5 (L) 05/20/2022 0637   GFRNONAA >60 07/02/2014 1923   GFRAA >60 04/22/2018 2215   GFRAA >60 07/02/2014 1923   CBC     Component Value Date/Time   WBC 5.0 05/19/2022 0345   RBC 2.81 (L) 05/19/2022 0345   HGB 8.0 (L) 05/19/2022 0345   HGB 9.4 (L) 07/02/2014 1923   HCT 23.7 (L) 05/19/2022 0345   HCT 30.3 (L) 07/02/2014 1923   PLT 303 05/19/2022 0345   PLT 449 (H) 07/02/2014 1923   MCV 84.3 05/19/2022 0345   MCV 71 (L) 07/02/2014 1923   MCH 28.5 05/19/2022 0345   MCHC 33.8 05/19/2022 0345   RDW 15.5 05/19/2022 0345   RDW 18.8 (H) 07/02/2014 1923   LYMPHSABS 2.0 05/07/2022 0428   MONOABS 2.4 (H) 05/07/2022 0428   EOSABS 0.0 05/07/2022 0428   BASOSABS 0.1 05/07/2022 0428   Assessment/Plan:   AKI - likely ATN in the setting of cardiogenic and septic shock. CRRT 10/4-10/14/23 via RIJ temp HD catheter. First IHD 10/16.  No signs of recovery at this time.  Failed diuretic challenge initially though bladder scan 31m this AM - I/O if unable to void, perhaps early renal recovery. Daily labs Will keep HD on MWF schedule. [facility] Avoid nephrotoxic medications including NSAIDs and iodinated intravenous contrast exposure unless the latter is absolutely indicated.  Preferred narcotic agents for pain control are hydromorphone, fentanyl, and methadone. Morphine should not be used. Avoid Baclofen and avoid oral sodium phosphate and magnesium citrate based laxatives / bowel preps. Continue strict Input and Output monitoring. Will monitor the patient closely with you and intervene or adjust therapy as indicated by changes in clinical status/labs  Cardiogenic/septic shock - off pressors now.  Heart failure team had been following. Likely due to aspiration pneumonia. Acute hypoxic respiratory failure - was re-intubated 05/05/22.  Extubated again on 10/14 but failed BiPAP, s/p trach and back on Vent 05/11/22 per PCCM. TC trials per CCM Infectious enteritis - seen on CT scan.  Management per CCM/surgery Anemia of critical illness - transfuse for Hgb <7. Requiring PRBCs. Per primary. ESA started 10/18 Acute MI - normal  coronaries by cath 10/4 Hyperkalemia: ongoing issue. Preemptive lokelma over the weekend Thrombocytopenia - improved/resolved Hyperphosphatemia: Receiving continuous feeds so binders of little efficacy.  Suggest decreasing phosphorus  in feeds as able S/p robotic incisional hernia repair with mesh and LOA - Surgery following.

## 2022-05-21 DIAGNOSIS — R6521 Severe sepsis with septic shock: Secondary | ICD-10-CM | POA: Diagnosis not present

## 2022-05-21 DIAGNOSIS — A419 Sepsis, unspecified organism: Secondary | ICD-10-CM | POA: Diagnosis not present

## 2022-05-21 LAB — RENAL FUNCTION PANEL
Albumin: 1.8 g/dL — ABNORMAL LOW (ref 3.5–5.0)
Anion gap: 13 (ref 5–15)
BUN: 89 mg/dL — ABNORMAL HIGH (ref 6–20)
CO2: 27 mmol/L (ref 22–32)
Calcium: 8.1 mg/dL — ABNORMAL LOW (ref 8.9–10.3)
Chloride: 93 mmol/L — ABNORMAL LOW (ref 98–111)
Creatinine, Ser: 5.44 mg/dL — ABNORMAL HIGH (ref 0.44–1.00)
GFR, Estimated: 9 mL/min — ABNORMAL LOW (ref >60)
Glucose, Bld: 105 mg/dL — ABNORMAL HIGH (ref 70–99)
Phosphorus: 6.3 mg/dL — ABNORMAL HIGH (ref 2.5–4.6)
Potassium: 4.8 mmol/L (ref 3.5–5.1)
Sodium: 133 mmol/L — ABNORMAL LOW (ref 135–145)

## 2022-05-21 LAB — GLUCOSE, CAPILLARY
Glucose-Capillary: 111 mg/dL — ABNORMAL HIGH (ref 70–99)
Glucose-Capillary: 105 mg/dL — ABNORMAL HIGH (ref 70–99)
Glucose-Capillary: 113 mg/dL — ABNORMAL HIGH (ref 70–99)
Glucose-Capillary: 125 mg/dL — ABNORMAL HIGH (ref 70–99)
Glucose-Capillary: 134 mg/dL — ABNORMAL HIGH (ref 70–99)
Glucose-Capillary: 119 mg/dL — ABNORMAL HIGH (ref 70–99)

## 2022-05-21 LAB — VITAMIN B6: Vitamin B6: 6.4 ug/L (ref 3.4–65.2)

## 2022-05-21 LAB — MAGNESIUM: Magnesium: 2.2 mg/dL (ref 1.7–2.4)

## 2022-05-21 NOTE — Progress Notes (Signed)
NAME:  Patricia [name], MRN:  [id number], DOB:  [birth year], LOS: 39 ADMISSION DATE:  04/23/2022, CONSULTATION DATE:  04/29/2022 REFERRING MD:  Patricia Morn, MD, CHIEF COMPLAINT:   Small bowel obstruction, aspiration event, sepsis, respiratory failure    History of Present Illness:  [name] with above hx and nuc study in 2015 neg for ischemia done for chest pain and echo with EF 60-65% presented to hospital for excisional hernia repair (robotic hernia repair 04/23/22) with mesh, ileus post op on TNA. Developed vomiting and hypotension after NG came out on 04/29/22 and PNA on CXR, was tachycardic and BP improved with IV fluids. DX with sepsis and ABX started. At one point prolonged Qtc. Pt admitted to ICU on 10/2.  Was placed on levophed. Early AM on 10/3 was intubated for worsening metabolic/lactic acidosis. She had AKI as well. Crt was 6.01, her normal 0.9. Today Cr is 4 on CRRT.  Pertinent  Medical History  DM2 Depression OSA Migraines  Asthma Obesity Schizophrenia HTN HLD  Significant Hospital Events: Including procedures, antibiotic start and stop dates in addition to other pertinent events   9/26 Robotic hernia repair complicated by abd adhesions.  10/2 CT + for developing SBO and bilat lower lobe opacities most likely aspiration PNA. Progressive decompensation requiring transfer to ICU 10/2 PCCM consulted for respiratory failure & shock 10\3 Intubated for resp failure. CVL and arterial line placed. 04/30/22: EF 30-35%, RV moderately reduced, mild to moderate MR, IVC collapsed throughout respiratory cycle 10\4 EKG changes/trop increased--> and taken to cath lab - No obstructive CAD, but in decompensated heart failure, Bedside echo with EF 15%, new WMA, moderate RV dysfunction, trivial pericardial effusion.underwent emergent L/RHC. Normal coronaries, RA mean 13, PA mean 29, LVEDP 16, PCWP 19, CO/CI 3.96/1.87   returned to CICU post-cath. Right heart cath placed right fem.  Right IJ HD cath placed.  10/5 advanced HF team following, shock supported on norepi/vasopressin and Dobutamine. On CRRT.  10/6 weaning Norepi. Starting to wean fent. Needing restraints.  10/8: Reintubated due to neuromuscular weakness.  Bedside echo demonstrating improved RV function, LV function remained depressed 10/9 CT abdomen pelvis.suggesting wall thickening throughout the proximal and mid small bowel consistent with possible infectious or inflammatory enteritis there is also bibasilar airspace disease which could reflect pneumonia.  Antibiotic coverage widened.  Zosyn changed to meropenem.  Tolerating trickle feeds still on TPN 10/10 stopped stress dose steroids.  Resumed subcutaneous heparin. 10/13 albumin for coox decrease. Weaning vent . Failed SBT. At goal TF. 10/14 extubated to bipap failed due to weakness, trached 10/15 trial off CRRT, lasix 10/18 RIJ permcath 10/22 transfer 4N as a lateral   Interim History / Subjective:   HD overnight with UF of 3L. TF paused this morning for nausea and restarted, no longer feeling nauseated. Remained on trach collar overnight. Having bowel movements.  Objective   Blood pressure 132/86, pulse (!) 106, temperature 99 F (37.2 C), temperature source Axillary, resp. rate (!) 22, height '5\' 4"'$  (1.626 m), weight 107.3 kg, last menstrual period 07/07/2014, SpO2 95 %.    FiO2 (%):  [35 %] 35 %   Intake/Output Summary (Last 24 hours) at 05/21/2022 0739 Last data filed at 05/21/2022 0700 Gross per 24 hour  Intake 1038.06 ml  Output 3800 ml  Net -2761.94 ml   Filed Weights   05/20/22 0600 05/20/22 1820 05/20/22 2320  Weight: 111.1 kg 110.3 kg 107.3 kg    Examination: Physical exam: General appearance: 51 y.o., female,  NAD Eyes: PERRL, tracking appropriately Neck: 6 cuffed shiley flex in place with trach collar Lungs: a little rhonchorous bl, with normal respiratory effort CV: tachy RR, no murmur  Abdomen: Soft, mildly ttp; non-distended,  BS hypoactive  Extremities: 1+ dependent edema, warm Skin: Normal turgor and texture; no rash Neuro: grossly nonfocal  Labs/imaging reviewed  Labs ordered, no new imaging  Assessment & Plan:   Acute metabolic encephalopathy superimposed on hx bipolar disorder, schizophrenia   Mental status has improved Continue dilaudid taper Continue klonopin 0.5 qhs Continue seroquel 50 bid  Acute hypoxic respiratory failure d/t Aspiration PNA, (RLL), resolved Prolonged mechanical ventilation sp tracheostomy Pulmonary edema   Monitor Is/Os HD for target euvolemia Routine trach care  Acute biventricular HFrEF (Etiology sepsis, stress induced CM. Has clean coronaries) Cardiogenic shock resolved, EF improved.  Monitor I's and O's Continue low dose BB, 12.5 mg metoprolol twice daily Unable to tolerate ACE inhibitor in setting of recent AKI  AKI due to septic ATN, no renal recovery Hyponatremia Hyperkalemia Nephrology following on iHD Monitor electrolytes  S/p hernia repair &  LOA inflammatory enteritis  Continue TF as tolerated Postop care by surgical team  Anemia, acute on chronic  Monitor for any signs of bleeding Transfusion threshold for hemoglobin less than 7  DM2 with hyperglycemia  SSI  Severe protein calorie malnutrition  Morbid obesity Continue tube feeds  Pressure injury- lip in the setting of ET tube: Not present on admission Wound care team   Best Practice (right click and "Reselect all SmartList Selections" daily)   Diet/type: tube feeds DVT prophylaxis: prophylactic heparin  GI prophylaxis: PPI Lines: Dialysis Catheter Foley:  N/A - purewick Code Status:  full code  [place] for pager If no response to pager, please call [phone] until 7pm After 7pm, Please call E-link [phone]

## 2022-05-21 NOTE — Progress Notes (Signed)
Speech Language Pathology Treatment: [name] Speaking valve  Patient Details Name: Patricia [name] MRN: [id number] DOB: [birth year] Today's Date: 05/21/2022 Time: 2353-6144 SLP Time Calculation (min) (ACUTE ONLY): 16 min  Assessment / Plan / Recommendation Clinical Impression  SLP touched based with RT prior to PMV trial today. RT confirmed that pt had stayed on TC overnight, cuff was deflated already, and that PMV could be placed. PMV was donned for 10 minutes without overt signs of intolerance, but she remains quite dysphonic. Along with dysphonia, she also tends to talk at a faster rate, making speech more intelligible. SLP provided cues to be louder and to pause more between words, but carryover was limited beyond initial word of response. Would continue to use PMV with SLP only at this time. However, if she continues to remain on TC, and RT/MD continue to be okay with cuff staying deflated, then I think she will be able to progress toward use more frequently during the day. This may also help for assessment of swallowing in the near future.    HPI HPI: Ms. Voth is a 51 y.o. female who presented to hospital for excisional hernia repair (robotic hernia repair 04/23/22) with mesh, ileus post op on TNA. Developed vomiting and hypotension after NG came out on 04/29/22 and PNA on CXR, was tachycardic and BP improved with IV fluids. DX with sepsis and ABX started. Pt admitted to ICU on 10/2.  Was placed on levophed. Early AM on 10/3 was intubated for worsening metabolic/lactic acidosis. 10/4 cardiac cath with EF 15%; She had AKI as well. Started CRRT. Extubation 10/14 failed; underwent tracheostomy. TC trials started 10/16.      SLP Plan  Continue with current plan of care      Recommendations for follow up therapy are one component of a multi-disciplinary discharge planning process, led by the attending physician.  Recommendations may be updated based on patient status, additional  functional criteria and insurance authorization.    Recommendations         Patient may use Passy-Muir Speech Valve: with SLP only PMSV Supervision: Full         Oral Care Recommendations: Oral care QID Follow Up Recommendations: Skilled nursing-short term rehab (<3 hours/day) Assistance recommended at discharge: None SLP Visit Diagnosis: Aphonia (R49.1) Plan: Continue with current plan of care           Osie Bond., M.A. [place] Office [phone]  Secure chat preferred   05/21/2022, 2:53 PM

## 2022-05-21 NOTE — Progress Notes (Signed)
Patient ID: Patricia [name], female   DOB: [birth year], 51 y.o.   MRN: [id number] 20 Days Post-Op    Subjective: Doing well.  Asking questions with pen and paper.  Depressed that her kidneys aren't working. Objective: Vital signs in last 24 hours: Temp:  [97.8 F (36.6 C)-99 F (37.2 C)] 98.5 F (36.9 C) (10/24 1200) Pulse Rate:  [94-117] 103 (10/24 1501) Resp:  [17-45] 17 (10/24 1501) BP: (110-154)/(73-105) 144/97 (10/24 1501) SpO2:  [89 %-100 %] 98 % (10/24 1501) FiO2 (%):  [28 %-35 %] 28 % (10/24 1501) Weight:  [107.3 kg-110.3 kg] 107.3 kg (10/23 2320) Last BM Date : 05/20/22  Intake/Output from previous day: 10/23 0701 - 10/24 0700 In: 1038.1 [I.V.:10; NG/GT:978; IV Piggyback:50.1] Out: 3800 [Urine:800] Intake/Output this shift: Total I/O In: 470 [NG/GT:470] Out: -   General appearance: cooperative Neck: trach GI: soft, NT  Lab Results: CBC  Recent Labs    05/19/22 0345  WBC 5.0  HGB 8.0*  HCT 23.7*  PLT 303    BMET Recent Labs    05/20/22 1626 05/21/22 0656  NA 132* 133*  K 5.4* 4.8  CL 91* 93*  CO2 23 27  GLUCOSE 119* 105*  BUN 171* 89*  CREATININE 9.00* 5.44*  CALCIUM 8.6* 8.1*    PT/INR No results for input(s): "LABPROT", "INR" in the last 72 hours.  ABG No results for input(s): "PHART", "HCO3" in the last 72 hours.  Invalid input(s): "PCO2", "PO2"  Studies/Results: DG Chest Port 1 View  Result Date: 05/20/2022 CLINICAL DATA:  [id number], [id number]. Aspiration. Feeding tube placement. EXAM: ABDOMEN - 1 VIEW; PORTABLE CHEST - 1 VIEW COMPARISON:  Abdomen film 05/10/2022, portable chest 05/11/2022 FINDINGS: AP portable chest 2:47 a.m.: There are multiple overlying monitor wires. The patient is rotated somewhat to the right. Interval removal prior right PICC. There is now a right IJ double-lumen catheter in place with the tip in the upper right atrium. Tracheostomy cannula tip is 5.8 cm from the carina with feeding tube entering the stomach. Low  inspiration is again noted with atelectatic bands in the bases. No focal pneumonia is evident but there is limited view of the bases due to low lung volumes. Cardiomegaly.  The central vessels are normal caliber. Aortic tortuosity and ectasia again noted with stable mediastinum. No acute skeletal findings. Single view of the abdomen 2:48 a.m.: The pelvic bowel was not included. Image detail is limited due to habitus. Feeding tube courses through the stomach into the duodenum and just past the ligament of Treitz in the proximal jejunum. There is increased dilatation of mid abdominal small bowel with 2 dilated segments up to 4.6 cm, unknown whether due to partial obstruction or ileus. Rest of the visualized bowel is normal caliber. Visceral shadows are obscured due to body habitus. IMPRESSION: 1. Low lung volumes with basilar atelectatic bands. No convincing pneumonia. 2. Support devices as above. The tip of the feeding tube present in the most proximal jejunum. 3. Increased dilatation of 2 mid abdominal small bowel segments up to 4.6 cm, unknown if this is due to ileus or partial small bowel obstruction. Consider CT follow-up. 4. Cardiomegaly. Electronically Signed   By: Telford Nab M.D.   On: 05/20/2022 03:20   DG Abd 1 View  Result Date: 05/20/2022 CLINICAL DATA:  [id number], [id number]. Aspiration. Feeding tube placement. EXAM: ABDOMEN - 1 VIEW; PORTABLE CHEST - 1 VIEW COMPARISON:  Abdomen film 05/10/2022, portable chest 05/11/2022 FINDINGS: AP portable chest 2:47 a.m.: There are  multiple overlying monitor wires. The patient is rotated somewhat to the right. Interval removal prior right PICC. There is now a right IJ double-lumen catheter in place with the tip in the upper right atrium. Tracheostomy cannula tip is 5.8 cm from the carina with feeding tube entering the stomach. Low inspiration is again noted with atelectatic bands in the bases. No focal pneumonia is evident but there is limited view of the bases due  to low lung volumes. Cardiomegaly.  The central vessels are normal caliber. Aortic tortuosity and ectasia again noted with stable mediastinum. No acute skeletal findings. Single view of the abdomen 2:48 a.m.: The pelvic bowel was not included. Image detail is limited due to habitus. Feeding tube courses through the stomach into the duodenum and just past the ligament of Treitz in the proximal jejunum. There is increased dilatation of mid abdominal small bowel with 2 dilated segments up to 4.6 cm, unknown whether due to partial obstruction or ileus. Rest of the visualized bowel is normal caliber. Visceral shadows are obscured due to body habitus. IMPRESSION: 1. Low lung volumes with basilar atelectatic bands. No convincing pneumonia. 2. Support devices as above. The tip of the feeding tube present in the most proximal jejunum. 3. Increased dilatation of 2 mid abdominal small bowel segments up to 4.6 cm, unknown if this is due to ileus or partial small bowel obstruction. Consider CT follow-up. 4. Cardiomegaly. Electronically Signed   By: Telford Nab M.D.   On: 05/20/2022 03:20    Anti-infectives: Anti-infectives (From admission, onward)    Start     Dose/Rate Route Frequency Ordered Stop   05/15/22 1445  ceFAZolin (ANCEF) IVPB 2g/100 mL premix       Note to Pharmacy: To be given in IR   2 g 200 mL/hr over 30 Minutes Intravenous  Once 05/15/22 1352 05/15/22 1616   05/15/22 1230  ceFAZolin (ANCEF) powder 1 g  Status:  Discontinued        1 g Other To Surgery 05/15/22 1140 05/15/22 1239   05/15/22 1230  ceFAZolin (ANCEF) IVPB 2g/100 mL premix        2 g 200 mL/hr over 30 Minutes Intravenous To Radiology 05/15/22 1140 05/15/22 1140   05/15/22 0000  ceFAZolin (ANCEF) IVPB 2g/100 mL premix        2 g 200 mL/hr over 30 Minutes Intravenous To Radiology 05/14/22 1308 05/15/22 0005   05/13/22 2134  meropenem (MERREM) 500 mg in sodium chloride 0.9 % 100 mL IVPB        500 mg 200 mL/hr over 30 Minutes  Intravenous Every 24 hours 05/13/22 0815 05/17/22 0700   05/13/22 1000  meropenem (MERREM) 500 mg in sodium chloride 0.9 % 100 mL IVPB  Status:  Discontinued        500 mg 200 mL/hr over 30 Minutes Intravenous Every 12 hours 05/12/22 0940 05/12/22 0946   05/12/22 2200  meropenem (MERREM) 500 mg in sodium chloride 0.9 % 100 mL IVPB  Status:  Discontinued        500 mg 200 mL/hr over 30 Minutes Intravenous Every 12 hours 05/12/22 0946 05/13/22 0815   05/12/22 2000  meropenem (MERREM) 1 g in sodium chloride 0.9 % 100 mL IVPB  Status:  Discontinued        1 g 200 mL/hr over 30 Minutes Intravenous  Once 05/12/22 0940 05/12/22 0946   05/06/22 1530  meropenem (MERREM) 1 g in sodium chloride 0.9 % 100 mL IVPB  Status:  Discontinued        1 g 200 mL/hr over 30 Minutes Intravenous Every 8 hours 05/06/22 1438 05/12/22 0940   05/04/22 1000  vancomycin (VANCOCIN) IVPB 1000 mg/200 mL premix  Status:  Discontinued        1,000 mg 200 mL/hr over 60 Minutes Intravenous Every 24 hours 05/03/22 0802 05/08/22 0913   05/03/22 0845  vancomycin (VANCOREADY) IVPB 2000 mg/400 mL        2,000 mg 200 mL/hr over 120 Minutes Intravenous  Once 05/03/22 0751 05/03/22 1114   05/01/22 1800  piperacillin-tazobactam (ZOSYN) IVPB 3.375 g  Status:  Discontinued        3.375 g 12.5 mL/hr over 240 Minutes Intravenous Every 6 hours 05/01/22 1426 05/01/22 1427   05/01/22 1515  piperacillin-tazobactam (ZOSYN) IVPB 3.375 g  Status:  Discontinued        3.375 g 100 mL/hr over 30 Minutes Intravenous Every 6 hours 05/01/22 1428 05/06/22 1438   04/30/22 1400  piperacillin-tazobactam (ZOSYN) IVPB 2.25 g  Status:  Discontinued        2.25 g 100 mL/hr over 30 Minutes Intravenous Every 8 hours 04/30/22 1325 05/01/22 1426   04/29/22 1530  piperacillin-tazobactam (ZOSYN) IVPB 3.375 g  Status:  Discontinued        3.375 g 12.5 mL/hr over 240 Minutes Intravenous Every 8 hours 04/29/22 1433 04/30/22 1325   04/29/22 1145  metroNIDAZOLE  (FLAGYL) IVPB 500 mg  Status:  Discontinued        500 mg 100 mL/hr over 60 Minutes Intravenous Every 12 hours 04/29/22 1047 04/29/22 1433   04/29/22 1145  vancomycin (VANCOCIN) IVPB 1000 mg/200 mL premix  Status:  Discontinued        1,000 mg 200 mL/hr over 60 Minutes Intravenous  Once 04/29/22 1047 04/29/22 1055   04/29/22 1145  vancomycin (VANCOREADY) IVPB 1500 mg/300 mL  Status:  Discontinued        1,500 mg 150 mL/hr over 120 Minutes Intravenous Every 24 hours 04/29/22 1055 04/29/22 1357   04/29/22 1045  ceFEPIme (MAXIPIME) 2 g in sodium chloride 0.9 % 100 mL IVPB  Status:  Discontinued        2 g 200 mL/hr over 30 Minutes Intravenous Every 8 hours 04/29/22 0954 04/29/22 1433   04/23/22 0730  ceFAZolin (ANCEF) IVPB 2g/100 mL premix        2 g 200 mL/hr over 30 Minutes Intravenous On call to O.R. 04/23/22 0726 04/23/22 0828       Assessment/Plan: Patricia [name] is s/p robotic incisional hernia repair on 04/23/22 -Postop ileus complicated by aspiration: Ileus resolved, having bowel function.   - Inflammatory enteritis likely due to ischemic insult with dilated bowel at time of high pressor support - still some small bowel dilation on x-ray, having bowel movements, advancing tube feeds. - Abdomen benign on exam. Slight abdominal pain - WBC normal - reglan, TF as tolerated   Acute metabolic encephalopathy superimposed on hx bipolar disorder, schizophrenia   ICU delirium- improved now off all IV infusions. Acute biventricular HFrEF (Etiology sepsis, stress induced CM. Has clean coronaries) Cardiogenic shock resolved, EF improved.  Acute hypoxic respiratory failure Aspiration PNA Pulm edema   RLL VAP  AKI with oliguria; no baseline CKD  Anemia, acute on chronic  DM2 with hyperglycemia  Severe protein calorie malnutrition  Pressure injury lip - improving with local care and trach [place] with multiple issues  Needs therapy and  recouperation  Dispo - Okay for transfer to stepdown, Case Management consult for placement - difficult with dialysis, vent needs   LOS: 27 days    Felicie Morn, MD   05/21/2022

## 2022-05-21 NOTE — Progress Notes (Signed)
Patient ID: Patricia [name], female   DOB: 1970/09/15, 51 y.o.   MRN: [id number] S: Had HD yesterday 3.0L UF.  UOP 0.8L which is a bit improvement.  She has no new complaints today.  Working with PT.   O:BP 119/79   Pulse (!) 111   Temp 99 F (37.2 C) (Oral)   Resp (!) 24   Ht '5\' 4"'$  (1.626 m)   Wt 107.3 kg   LMP 07/07/2014 (Approximate)   SpO2 98%   BMI 40.60 kg/m   Intake/Output Summary (Last 24 hours) at 05/21/2022 1031 Last data filed at 05/21/2022 0800 Gross per 24 hour  Intake 848.06 ml  Output 3800 ml  Net -2951.94 ml    Intake/Output: I/O last 3 completed shifts: In: 1448.1 [I.V.:20; NG/GT:1378; IV Piggyback:50.1] Out: 3800 [Urine:800; Other:3000]  Intake/Output this shift:  Total I/O In: 120 [NG/GT:120] Out: -  Weight change: -0.8 kg Gen:on trach collar, lying flat,  awake CVS: RRR Resp:on TC, bilateral chest rise Abd: +BS, soft, NT Ext: Trace edema in the bilateral hands with legs Neuro: awake  Recent Labs  Lab 05/18/22 0500 05/18/22 1553 05/19/22 0345 05/19/22 1521 05/20/22 0637 05/20/22 1626 05/21/22 0656  NA 133* 132* 133* 129* 130* 132* 133*  K 4.9 4.9 5.3* 5.4* 6.2* 5.4* 4.8  CL 95* 93* 92* 93* 92* 91* 93*  CO2 '26 25 23 24 22 23 27  '$ GLUCOSE 113* 105* 121* 123* 100* 119* 105*  BUN 85* 105* 122* 136* 158* 171* 89*  CREATININE 5.17* 6.08* 7.08* 7.69* 8.64* 9.00* 5.44*  ALBUMIN 2.0* 2.0* 1.9* 1.8* 1.8* 1.8* 1.8*  CALCIUM 8.5* 8.6* 8.9 8.3* 8.4* 8.6* 8.1*  PHOS 5.9* 7.5* 8.2* 8.7* 9.3* 9.7* 6.3*    Liver Function Tests: Recent Labs  Lab 05/20/22 0637 05/20/22 1626 05/21/22 0656  ALBUMIN 1.8* 1.8* 1.8*    No results for input(s): "LIPASE", "AMYLASE" in the last 168 hours. No results for input(s): "AMMONIA" in the last 168 hours. CBC: Recent Labs  Lab 05/15/22 0500 05/16/22 0407 05/17/22 0459 05/18/22 0500 05/19/22 0345  WBC 6.7 6.2 5.8 5.6 5.0  HGB 6.9* 7.7* 6.8* 7.9* 8.0*  HCT 20.9* 23.1* 21.4* 23.7* 23.7*  MCV 85.7 84.0 85.9  84.6 84.3  PLT 241 271 269 279 303    Cardiac Enzymes: No results for input(s): "CKTOTAL", "CKMB", "CKMBINDEX", "TROPONINI" in the last 168 hours. CBG: Recent Labs  Lab 05/20/22 1552 05/20/22 1919 05/20/22 2317 05/21/22 0308 05/21/22 0743  GLUCAP 113* 119* 145* 111* 105*     Iron Studies:  No results for input(s): "IRON", "TIBC", "TRANSFERRIN", "FERRITIN" in the last 72 hours.  Studies/Results: DG Chest Port 1 View  Result Date: 05/20/2022 CLINICAL DATA:  [id number], [id number]. Aspiration. Feeding tube placement. EXAM: ABDOMEN - 1 VIEW; PORTABLE CHEST - 1 VIEW COMPARISON:  Abdomen film 05/10/2022, portable chest 05/11/2022 FINDINGS: AP portable chest 2:47 a.m.: There are multiple overlying monitor wires. The patient is rotated somewhat to the right. Interval removal prior right PICC. There is now a right IJ double-lumen catheter in place with the tip in the upper right atrium. Tracheostomy cannula tip is 5.8 cm from the carina with feeding tube entering the stomach. Low inspiration is again noted with atelectatic bands in the bases. No focal pneumonia is evident but there is limited view of the bases due to low lung volumes. Cardiomegaly.  The central vessels are normal caliber. Aortic tortuosity and ectasia again noted with stable mediastinum. No acute skeletal findings. Single view of  the abdomen 2:48 a.m.: The pelvic bowel was not included. Image detail is limited due to habitus. Feeding tube courses through the stomach into the duodenum and just past the ligament of Treitz in the proximal jejunum. There is increased dilatation of mid abdominal small bowel with 2 dilated segments up to 4.6 cm, unknown whether due to partial obstruction or ileus. Rest of the visualized bowel is normal caliber. Visceral shadows are obscured due to body habitus. IMPRESSION: 1. Low lung volumes with basilar atelectatic bands. No convincing pneumonia. 2. Support devices as above. The tip of the feeding tube present  in the most proximal jejunum. 3. Increased dilatation of 2 mid abdominal small bowel segments up to 4.6 cm, unknown if this is due to ileus or partial small bowel obstruction. Consider CT follow-up. 4. Cardiomegaly. Electronically Signed   By: Telford Nab M.D.   On: 05/20/2022 03:20   DG Abd 1 View  Result Date: 05/20/2022 CLINICAL DATA:  [id number], [id number]. Aspiration. Feeding tube placement. EXAM: ABDOMEN - 1 VIEW; PORTABLE CHEST - 1 VIEW COMPARISON:  Abdomen film 05/10/2022, portable chest 05/11/2022 FINDINGS: AP portable chest 2:47 a.m.: There are multiple overlying monitor wires. The patient is rotated somewhat to the right. Interval removal prior right PICC. There is now a right IJ double-lumen catheter in place with the tip in the upper right atrium. Tracheostomy cannula tip is 5.8 cm from the carina with feeding tube entering the stomach. Low inspiration is again noted with atelectatic bands in the bases. No focal pneumonia is evident but there is limited view of the bases due to low lung volumes. Cardiomegaly.  The central vessels are normal caliber. Aortic tortuosity and ectasia again noted with stable mediastinum. No acute skeletal findings. Single view of the abdomen 2:48 a.m.: The pelvic bowel was not included. Image detail is limited due to habitus. Feeding tube courses through the stomach into the duodenum and just past the ligament of Treitz in the proximal jejunum. There is increased dilatation of mid abdominal small bowel with 2 dilated segments up to 4.6 cm, unknown whether due to partial obstruction or ileus. Rest of the visualized bowel is normal caliber. Visceral shadows are obscured due to body habitus. IMPRESSION: 1. Low lung volumes with basilar atelectatic bands. No convincing pneumonia. 2. Support devices as above. The tip of the feeding tube present in the most proximal jejunum. 3. Increased dilatation of 2 mid abdominal small bowel segments up to 4.6 cm, unknown if this is due to  ileus or partial small bowel obstruction. Consider CT follow-up. 4. Cardiomegaly. Electronically Signed   By: Telford Nab M.D.   On: 05/20/2022 03:20    sodium chloride   Intravenous Once   Chlorhexidine Gluconate Cloth  6 each Topical Q0600   clonazePAM  0.5 mg Per Tube QHS   darbepoetin (ARANESP) injection - DIALYSIS  100 mcg Intravenous Q Fri-HD   feeding supplement (PROSource TF20)  60 mL Per Tube BID   fiber  1 packet Per Tube BID   heparin injection (subcutaneous)  5,000 Units Subcutaneous Q8H   HYDROmorphone HCl  1 mg Per Tube Q4H   Followed by   [START ON 05/22/2022] HYDROmorphone HCl  0.5 mg Per Tube Q4H   insulin aspart  0-20 Units Subcutaneous Q4H   insulin aspart  4 Units Subcutaneous Q4H   metoCLOPramide (REGLAN) injection  10 mg Intravenous Q6H   metoprolol tartrate  12.5 mg Per Tube BID   multivitamin  1 tablet Per Tube QHS  neomycin-bacitracin-polymyxin   Topical BID   nutrition supplement (JUVEN)  1 packet Per Tube BID BM   mouth rinse  15 mL Mouth Rinse 4 times per day   pantoprazole  40 mg Per Tube Daily   QUEtiapine  50 mg Per Tube BID   sodium chloride flush  10-40 mL Intracatheter Q12H   sodium zirconium cyclosilicate  10 g Per Tube BID    BMET    Component Value Date/Time   NA 133 (L) 05/21/2022 0656   NA 139 07/02/2014 1923   K 4.8 05/21/2022 0656   K 3.9 07/02/2014 1923   CL 93 (L) 05/21/2022 0656   CL 105 07/02/2014 1923   CO2 27 05/21/2022 0656   CO2 26 07/02/2014 1923   GLUCOSE 105 (H) 05/21/2022 0656   GLUCOSE 95 07/02/2014 1923   BUN 89 (H) 05/21/2022 0656   BUN 7 07/02/2014 1923   CREATININE 5.44 (H) 05/21/2022 0656   CREATININE 0.98 07/02/2014 1923   CALCIUM 8.1 (L) 05/21/2022 0656   CALCIUM 8.5 07/02/2014 1923   GFRNONAA 9 (L) 05/21/2022 0656   GFRNONAA >60 07/02/2014 1923   GFRAA >60 04/22/2018 2215   GFRAA >60 07/02/2014 1923   CBC    Component Value Date/Time   WBC 5.0 05/19/2022 0345   RBC 2.81 (L) 05/19/2022 0345   HGB  8.0 (L) 05/19/2022 0345   HGB 9.4 (L) 07/02/2014 1923   HCT 23.7 (L) 05/19/2022 0345   HCT 30.3 (L) 07/02/2014 1923   PLT 303 05/19/2022 0345   PLT 449 (H) 07/02/2014 1923   MCV 84.3 05/19/2022 0345   MCV 71 (L) 07/02/2014 1923   MCH 28.5 05/19/2022 0345   MCHC 33.8 05/19/2022 0345   RDW 15.5 05/19/2022 0345   RDW 18.8 (H) 07/02/2014 1923   LYMPHSABS 2.0 05/07/2022 0428   MONOABS 2.4 (H) 05/07/2022 0428   EOSABS 0.0 05/07/2022 0428   BASOSABS 0.1 05/07/2022 0428   Assessment/Plan:   AKI - likely ATN in the setting of cardiogenic and septic shock. CRRT 10/4-10/14/23 via RIJ temp HD catheter. First IHD 10/16.  UOP seems to be improving ? Early recovery.  Will follow daily labs for intradialytic Cr rise.  Ten[place] will keep HD on MWF schedule but request 2nd shift in case showing more evidence of renal recovery. S/p [facility] 10/18 Avoid nephrotoxic medications including NSAIDs and iodinated intravenous contrast exposure unless the latter is absolutely indicated.  Preferred narcotic agents for pain control are hydromorphone, fentanyl, and methadone. Morphine should not be used. Avoid Baclofen and avoid oral sodium phosphate and magnesium citrate based laxatives / bowel preps. Continue strict Input and Output monitoring. Will monitor the patient closely with you and intervene or adjust therapy as indicated by changes in clinical status/labs  Cardiogenic/septic shock - off pressors now.  Heart failure team had been following. Likely due to aspiration pneumonia. Acute hypoxic respiratory failure - was re-intubated 05/05/22.  Extubated again on 10/14 but failed BiPAP, s/p trach and back on Vent 05/11/22 per PCCM. TC trials per CCM Infectious enteritis - seen on CT scan.   surgery following Anemia of critical illness - transfuse for Hgb <7. Requiring PRBCs. Per primary. ESA started 10/18 Acute MI - normal coronaries by cath 10/4 Hyperkalemia: ongoing issue. On lokelma BID - K this AM 4.8 - watch and  d/c lokelma as able, 2K dialysate Hyperphosphatemia: Receiving continuous feeds so binders of little efficacy.  Suggest decreasing phosphorus in feeds as able S/p robotic incisional hernia repair  with mesh and LOA - Surgery following.

## 2022-05-21 NOTE — Progress Notes (Signed)
E-link notified that family wishes to do video chat.

## 2022-05-21 NOTE — Progress Notes (Addendum)
Nutrition Follow-up  DOCUMENTATION CODES:   Not applicable  INTERVENTION:   Tube feeding via Cortrak (tip in proximal jejunum): Peptamen 1.5 at 50 ml/h (1200 ml per day) Prosource TF20 60 ml daily  Provides 1880 kcal, 102 gm protein, 912 ml free water daily  Rena-vit daily (60 mg Vitamin C daily)  Juven BID (300 mg Vitamin C each)   Copper and Zinc levels pending   NUTRITION DIAGNOSIS:   Inadequate oral intake related to inability to eat as evidenced by NPO status (SBO). Ongoing.   GOAL:   Patient will meet greater than or equal to 90% of their needs Met with TF at goal   MONITOR:   TF tolerance, Skin, Vent status, Weight trends, I & O's, Labs  REASON FOR ASSESSMENT:   Consult Enteral/tube feeding initiation and management (trickle tube feeds)  ASSESSMENT:   Pt with hx of bipolar disorder, HTN, HLD, GERD, chronic bronchitis, GERD, DM type 2, and multiple prior abdominal surgeries presented to hospital for planned repair of a spigelian hernia.  Pt discussed during ICU rounds and with RN and MD.  Pt weaning on trach collar.   Pt has had her TF stopped mulitple times due to nausea and sometimes vomiting. Small bore feeding tube tip remains in the proximal jejunum. If needed recommend NG for decompression vs stopping TF.  Per TOC pt requires HD Vent bed and the 3 facilities in [place] do not have beds, continue search for out of state options.   9/26 - admitted for elective robotic hernia repair complicated by abd adhesions 10/2 - SBO later intubated; TPN started 10/4-10/14 - CRRT 10/9 - trickle TF started; adv to goal 10/12 10/13 - cortrak placed; tip in proximal jejunum  10/14 - s/p trach  10/15 - TPN d/c'ed  10/16 - iHD    Medications reviewed and include: nutrisource fiber BID (10/18), SSI, 4 units novolog every 4 hours, 10 mg reglan every 6 hours (10/21), rena-vit, juven, protonix, lokelma BID (10/23) Calcium gluconate x 1   Labs reviewed: 133, K 4.8, PO4  6.3 CRP: 1.9 (H) Ceruloplasmin: 10.8 (L) Vitamin B12: 1079 Vitamin B6: 6.4 Vitamin C: 0.5  CBG's: 92-145  UOP: 800 ml  UF: 3000 ml (10/23) Weight down to 107.3 kg, ? Admission weight 109.3 kg  Mild edema noted  Diet Order:   Diet Order             Diet NPO time specified  Diet effective midnight                   EDUCATION NEEDS:   No education needs have been identified at this time  Skin:  Skin Assessment:  (wound: buttocks) Skin Integrity Issues:: DTI DTI: upper lip from ET tube  Last BM:  10/23 (rectal tube removed 10/20)  Height:   Ht Readings from Last 1 Encounters:  05/10/22 '5\' 4"'  (1.626 m)    Weight:   Wt Readings from Last 1 Encounters:  05/20/22 107.3 kg    Ideal Body Weight:  54.5 kg  BMI:  Body mass index is 40.6 kg/m.  Estimated Nutritional Needs:   Kcal:  1800-2000 kcal/d  Protein:  90-110 mL  Fluid:  1000 mL plus UOP  Monaye Blackie P., RD, LDN, CNSC See AMiON for contact information

## 2022-05-21 NOTE — Progress Notes (Signed)
Physical Therapy Treatment Patient Details Name: Patricia [name] MRN: [id number] DOB: May 17, 1971 Today's Date: 05/21/2022   History of Present Illness 51 yo female admitted 04/23/22 for excisional hernia repair with mesh, ileus post op on TNA. Developed vomiting and hypotension after NGT removed 04/29/22 and PNA on CXR, was tachycardic. DX with sepsis and ABX started. Pt admitted to ICU on 10/2 on levophed. 10/3 intubated for worsening metabolic/lactic acidosis. 10/4 cardiac cath with EF 15%; She had AKI as well. CRRT 10/5-10/14. Failed extubation 10/8 and 10/14 with trach 10/14. PMhx: depression, OSA, asthma, schizophrenia, DM, insomnia    PT Comments    Pt progressing towards all goals. Pt engaging with therapist and able to mouth words to express her needs. Pt was soiled upon PT arrival worked on rolling L/R and then transitioned to EOB with maxAX2. Attempted to stand x3 today however unsuccessful.  Pt remains very deconditioned with decreased activity tolerance. Acute PT to cont to follow.   Recommendations for follow up therapy are one component of a multi-disciplinary discharge planning process, led by the attending physician.  Recommendations may be updated based on patient status, additional functional criteria and insurance authorization.  Follow Up Recommendations  Skilled nursing-short term rehab (<3 hours/day) Can patient physically be transported by private vehicle: No   Assistance Recommended at Discharge Frequent or constant Supervision/Assistance  Patient can return home with the following Two people to help with walking and/or transfers;Two people to help with bathing/dressing/bathroom;Assistance with cooking/housework;Assistance with feeding;Direct supervision/assist for medications management;Direct supervision/assist for financial management;Assist for transportation;Help with stairs or ramp for entrance   Equipment Recommendations  Hospital bed;Wheelchair (measurements  PT);Wheelchair cushion (measurements PT)    Recommendations for Other Services OT consult     Precautions / Restrictions Precautions Precautions: Fall Precaution Comments: trach collar, cortrak Restrictions Weight Bearing Restrictions: No     Mobility  Bed Mobility Overal bed mobility: Needs Assistance Bed Mobility: Rolling, Sidelying to Sit, Sit to Supine Rolling: Mod assist, +2 for physical assistance Sidelying to sit: +2 for physical assistance, Max assist   Sit to supine: +2 for safety/equipment, Max assist   General bed mobility comments: Pt needed assist for rolling to the side and transitioning LEs off of the bed while lifting trunk up to sitting.  She needed assist with all aspects of transition back to supine.    Transfers Overall transfer level: Needs assistance Equipment used:  (face to face transfer using bed pads below pt's bottom) Transfers: Sit to/from Stand Sit to Stand: +2 physical assistance, Max assist           General transfer comment: attempted to std x 3, unable to fully clear bottom however was able to lateral scoot along EOB with maxAx2, pt with good use of bilat UEs    Ambulation/Gait               General Gait Details: unable at this time   Stairs             Wheelchair Mobility    Modified Rankin (Stroke Patients Only)       Balance Overall balance assessment: Needs assistance Sitting-balance support: Bilateral upper extremity supported, Feet supported Sitting balance-Leahy Scale: Poor Sitting balance - Comments: pt requiring close min guard   Standing balance support: Bilateral upper extremity supported Standing balance-Leahy Scale: Zero Standing balance comment: unable to achieve standing  Cognition Arousal/Alertness: Awake/alert Behavior During Therapy: Flat affect Overall Cognitive Status: Difficult to assess (secondary to trach)                                  General Comments: pt able to follow simple commands consistently, mouthing words, stated she was dirty and pointed down to groin area        Exercises General Exercises - Lower Extremity Ankle Circles/Pumps:  (followed by bil Heel cord stretches with ROM WFL) Quad Sets: AROM, Both, 10 reps, Supine (with 3 sec hold) Long Arc Quad: AROM, Both, 5 reps, Seated (unable to achieve full ROM)    General Comments General comments (skin integrity, edema, etc.): VSS on RA, HR into 110s during mobility      Pertinent Vitals/Pain Pain Assessment Pain Assessment: Faces Faces Pain Scale: Hurts a little bit Pain Location: grimacing to movement in general Pain Descriptors / Indicators: Grimacing    Home Living                          Prior Function            PT Goals (current goals can now be found in the care plan section) Acute Rehab PT Goals PT Goal Formulation: Patient unable to participate in goal setting Time For Goal Achievement: 05/26/22 Potential to Achieve Goals: Good Progress towards PT goals: Progressing toward goals    Frequency    Min 3X/week      PT Plan Current plan remains appropriate    Co-evaluation              AM-PAC PT "6 Clicks" Mobility   Outcome Measure  Help needed turning from your back to your side while in a flat bed without using bedrails?: A Lot Help needed moving from lying on your back to sitting on the side of a flat bed without using bedrails?: Total Help needed moving to and from a bed to a chair (including a wheelchair)?: Total Help needed standing up from a chair using your arms (e.g., wheelchair or bedside chair)?: Total Help needed to walk in hospital room?: Total Help needed climbing 3-5 steps with a railing? : Total 6 Click Score: 7    End of Session Equipment Utilized During Treatment: Oxygen (via trach collar) Activity Tolerance: Patient tolerated treatment well Patient left: in bed;with call bell/phone  within reach Nurse Communication: Mobility status;Need for lift equipment PT Visit Diagnosis: Muscle weakness (generalized) (M62.81);Difficulty in walking, not elsewhere classified (R26.2);Other abnormalities of gait and mobility (R26.89)     Time: 1020-1055 PT Time Calculation (min) (ACUTE ONLY): 35 min  Charges:  $Therapeutic Activity: 23-37 mins                     Kittie Plater, PT, DPT Acute Rehabilitation Services Secure chat preferred Office #: [phone]    Berline Lopes 05/21/2022, 11:56 AM

## 2022-05-22 ENCOUNTER — Inpatient Hospital Stay (HOSPITAL_COMMUNITY): Payer: Medicaid Other

## 2022-05-22 DIAGNOSIS — A419 Sepsis, unspecified organism: Secondary | ICD-10-CM | POA: Diagnosis not present

## 2022-05-22 DIAGNOSIS — R6521 Severe sepsis with septic shock: Secondary | ICD-10-CM | POA: Diagnosis not present

## 2022-05-22 DIAGNOSIS — L8990 Pressure ulcer of unspecified site, unspecified stage: Secondary | ICD-10-CM | POA: Insufficient documentation

## 2022-05-22 LAB — RENAL FUNCTION PANEL
Albumin: 1.7 g/dL — ABNORMAL LOW (ref 3.5–5.0)
Anion gap: 17 — ABNORMAL HIGH (ref 5–15)
BUN: 122 mg/dL — ABNORMAL HIGH (ref 6–20)
CO2: 26 mmol/L (ref 22–32)
Calcium: 8.5 mg/dL — ABNORMAL LOW (ref 8.9–10.3)
Chloride: 91 mmol/L — ABNORMAL LOW (ref 98–111)
Creatinine, Ser: 7.37 mg/dL — ABNORMAL HIGH (ref 0.44–1.00)
GFR, Estimated: 6 mL/min — ABNORMAL LOW (ref >60)
Glucose, Bld: 131 mg/dL — ABNORMAL HIGH (ref 70–99)
Phosphorus: 7.2 mg/dL — ABNORMAL HIGH (ref 2.5–4.6)
Potassium: 5.2 mmol/L — ABNORMAL HIGH (ref 3.5–5.1)
Sodium: 134 mmol/L — ABNORMAL LOW (ref 135–145)

## 2022-05-22 LAB — CBC
HCT: 23.3 % — ABNORMAL LOW (ref 36.0–46.0)
Hemoglobin: 7.6 g/dL — ABNORMAL LOW (ref 12.0–15.0)
MCH: 27.7 pg (ref 26.0–34.0)
MCHC: 32.6 g/dL (ref 30.0–36.0)
MCV: 85 fL (ref 80.0–100.0)
Platelets: 376 10*3/uL (ref 150–400)
RBC: 2.74 MIL/uL — ABNORMAL LOW (ref 3.87–5.11)
RDW: 15.9 % — ABNORMAL HIGH (ref 11.5–15.5)
WBC: 5.4 10*3/uL (ref 4.0–10.5)
nRBC: 0 % (ref 0.0–0.2)

## 2022-05-22 LAB — GLUCOSE, CAPILLARY
Glucose-Capillary: 146 mg/dL — ABNORMAL HIGH (ref 70–99)
Glucose-Capillary: 134 mg/dL — ABNORMAL HIGH (ref 70–99)
Glucose-Capillary: 103 mg/dL — ABNORMAL HIGH (ref 70–99)
Glucose-Capillary: 118 mg/dL — ABNORMAL HIGH (ref 70–99)
Glucose-Capillary: 132 mg/dL — ABNORMAL HIGH (ref 70–99)
Glucose-Capillary: 131 mg/dL — ABNORMAL HIGH (ref 70–99)

## 2022-05-22 LAB — MAGNESIUM: Magnesium: 2.6 mg/dL — ABNORMAL HIGH (ref 1.7–2.4)

## 2022-05-22 MED ORDER — ALBUMIN HUMAN 25 % IV SOLN
25.0000 g | INTRAVENOUS | Status: DC | PRN
  Filled 2022-05-22 (×2): qty 100

## 2022-05-22 MED ORDER — HYDROMORPHONE HCL 1 MG/ML IJ SOLN
0.5000 mg | Freq: Once | INTRAMUSCULAR | Status: AC
  Administered 2022-05-22: 0.5 mg via INTRAVENOUS
  Filled 2022-05-22: qty 1

## 2022-05-22 MED ORDER — LORAZEPAM 2 MG/ML IJ SOLN
2.0000 mg | Freq: Once | INTRAMUSCULAR | Status: AC
  Administered 2022-05-22: 2 mg via INTRAVENOUS
  Filled 2022-05-22: qty 1

## 2022-05-22 MED ORDER — QUETIAPINE FUMARATE 50 MG PO TABS
50.0000 mg | ORAL_TABLET | Freq: Every day | ORAL | Status: DC
  Administered 2022-05-22 – 2022-06-02 (×12): 50 mg
  Filled 2022-05-22 (×7): qty 1
  Filled 2022-05-22: qty 2
  Filled 2022-05-22 (×4): qty 1
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
## 2022-05-22 NOTE — Progress Notes (Signed)
NAME:  Patricia [name], MRN:  [id number], DOB:  October 12, 1970, LOS: 78 ADMISSION DATE:  04/23/2022, CONSULTATION DATE:  04/29/2022 REFERRING MD:  Felicie Morn, MD, CHIEF COMPLAINT:   Small bowel obstruction, aspiration event, sepsis, respiratory failure    History of Present Illness:  Ms. Tech with above hx and nuc study in 2015 neg for ischemia done for chest pain and echo with EF 60-65% presented to hospital for excisional hernia repair (robotic hernia repair 04/23/22) with mesh, ileus post op on TNA. Developed vomiting and hypotension after NG came out on 04/29/22 and PNA on CXR, was tachycardic and BP improved with IV fluids. DX with sepsis and ABX started. At one point prolonged Qtc. Pt admitted to ICU on 10/2.  Was placed on levophed. Early AM on 10/3 was intubated for worsening metabolic/lactic acidosis. She had AKI as well. Crt was 6.01, her normal 0.9. Today Cr is 4 on CRRT.  Pertinent  Medical History  DM2 Depression OSA Migraines  Asthma Obesity Schizophrenia HTN HLD  Significant Hospital Events: Including procedures, antibiotic start and stop dates in addition to other pertinent events   9/26 Robotic hernia repair complicated by abd adhesions.  10/2 CT + for developing SBO and bilat lower lobe opacities most likely aspiration PNA. Progressive decompensation requiring transfer to ICU 10/2 PCCM consulted for respiratory failure & shock 10\3 Intubated for resp failure. CVL and arterial line placed. 04/30/22: EF 30-35%, RV moderately reduced, mild to moderate MR, IVC collapsed throughout respiratory cycle 10\4 EKG changes/trop increased--> and taken to cath lab - No obstructive CAD, but in decompensated heart failure, Bedside echo with EF 15%, new WMA, moderate RV dysfunction, trivial pericardial effusion.underwent emergent L/RHC. Normal coronaries, RA mean 13, PA mean 29, LVEDP 16, PCWP 19, CO/CI 3.96/1.87   returned to CICU post-cath. Right heart cath placed right fem.  Right IJ HD cath placed.  10/5 advanced HF team following, shock supported on norepi/vasopressin and Dobutamine. On CRRT.  10/6 weaning Norepi. Starting to wean fent. Needing restraints.  10/8: Reintubated due to neuromuscular weakness.  Bedside echo demonstrating improved RV function, LV function remained depressed 10/9 CT abdomen pelvis.suggesting wall thickening throughout the proximal and mid small bowel consistent with possible infectious or inflammatory enteritis there is also bibasilar airspace disease which could reflect pneumonia.  Antibiotic coverage widened.  Zosyn changed to meropenem.  Tolerating trickle feeds still on TPN 10/10 stopped stress dose steroids.  Resumed subcutaneous heparin. 10/13 albumin for coox decrease. Weaning vent . Failed SBT. At goal TF. 10/14 extubated to bipap failed due to weakness, trached 10/15 trial off CRRT, lasix 10/18 RIJ permcath 10/22 transfer 4N as a lateral after aspiration event briefly requiring mechanical ventilation  Interim History / Subjective:   No acute issues.  Objective   Blood pressure 131/75, pulse 98, temperature 99.3 F (37.4 C), temperature source Axillary, resp. rate (!) 22, height '5\' 4"'$  (1.626 m), weight 109.6 kg, last menstrual period 07/07/2014, SpO2 95 %.    FiO2 (%):  [28 %] 28 %   Intake/Output Summary (Last 24 hours) at 05/22/2022 0728 Last data filed at 05/22/2022 0700 Gross per 24 hour  Intake 1570 ml  Output --  Net 1570 ml   Filed Weights   05/20/22 1820 05/20/22 2320 05/22/22 0500  Weight: 110.3 kg 107.3 kg 109.6 kg    Examination: Physical exam: General appearance: 51 y.o., female, NAD Eyes: PERRL tracking appropriately Neck: 6 shiley cuffed in place with trach collar Lungs: rhonchorous bl, with normal  respiratory effort CV: borderline tachy RR, no murmur  Abdomen: Soft, mildly ttp; non-distended, BS hypoactive  Extremities: 1+ dependent edema, warm Skin: Normal turgor and texture; no rash Neuro:  grossly nonfocal, follows commands  Labs/imaging reviewed  Labs not yet collected, no new imaging  Assessment & Plan:   Acute metabolic encephalopathy superimposed on hx bipolar disorder, schizophrenia   Continue dilaudid taper Continue klonopin 0.5 qhs Decreased to seroquel 50 qhs  Acute hypoxic respiratory failure d/t Aspiration PNA, (RLL), resolved Prolonged mechanical ventilation sp tracheostomy Pulmonary edema   Monitor Is/Os HD for target euvolemia Routine trach care  Acute biventricular HFrEF (Etiology sepsis, stress induced CM. Has clean coronaries) Cardiogenic shock resolved, EF improved.  Monitor I's and O's Continue metoprolol 12.5 BID Unable to tolerate addition of ACEi in setting AKI  AKI due to septic ATN, no renal recovery Hyponatremia Hyperkalemia Nephrology following on iHD Monitor electrolytes, UOP  S/p hernia repair &  LOA inflammatory enteritis  Continue TF as tolerated Postop care by surgical team  Anemia, acute on chronic  Monitor for any signs of bleeding Transfusion threshold for hemoglobin less than 7  DM2 with hyperglycemia  SSI  Severe protein calorie malnutrition  Morbid obesity Continue tube feeds  Pressure injury- lip in the setting of ET tube: Not present on admission Wound care team   Best Practice (right click and "Reselect all SmartList Selections" daily)   Diet/type: tube feeds DVT prophylaxis: prophylactic heparin  GI prophylaxis: PPI Lines: Dialysis Catheter Foley:  N/A - purewick Code Status:  full code  [place] for pager If no response to pager, please call [phone] until 7pm After 7pm, Please call E-link [phone]

## 2022-05-22 NOTE — Progress Notes (Signed)
RN called phlebotomy to let them know patient has morning labs that have not been drawn.

## 2022-05-22 NOTE — Progress Notes (Signed)
Patient ID: Patricia [name], female   DOB: 08-04-1970, 51 y.o.   MRN: [id number] 21 Days Post-Op    Subjective: Making urine with foley in place.  Wants to eat  Objective: Vital signs in last 24 hours: Temp:  [98 F (36.7 C)-99.3 F (37.4 C)] 98 F (36.7 C) (10/25 1600) Pulse Rate:  [89-117] 114 (10/25 1732) Resp:  [16-26] 25 (10/25 1732) BP: (109-175)/(71-105) 175/105 (10/25 1732) SpO2:  [93 %-100 %] 100 % (10/25 1730) FiO2 (%):  [28 %] 28 % (10/25 1730) Weight:  [108.7 kg-109.6 kg] 108.7 kg (10/25 1335) Last BM Date : 05/22/22  Intake/Output from previous day: 10/24 0701 - 10/25 0700 In: 1570 [NG/GT:1570] Out: -  Intake/Output this shift: Total I/O In: 150 [NG/GT:150] Out: 800 [Urine:800]  General appearance: cooperative Neck: trach GI: soft, NT  Lab Results: CBC  Recent Labs    05/22/22 0805  WBC 5.4  HGB 7.6*  HCT 23.3*  PLT 376    BMET Recent Labs    05/21/22 0656 05/22/22 0805  NA 133* 134*  K 4.8 5.2*  CL 93* 91*  CO2 27 26  GLUCOSE 105* 131*  BUN 89* 122*  CREATININE 5.44* 7.37*  CALCIUM 8.1* 8.5*    PT/INR No results for input(s): "LABPROT", "INR" in the last 72 hours.  ABG No results for input(s): "PHART", "HCO3" in the last 72 hours.  Invalid input(s): "PCO2", "PO2"  Studies/Results: No results found.  Anti-infectives: Anti-infectives (From admission, onward)    Start     Dose/Rate Route Frequency Ordered Stop   05/15/22 1445  ceFAZolin (ANCEF) IVPB 2g/100 mL premix       Note to Pharmacy: To be given in IR   2 g 200 mL/hr over 30 Minutes Intravenous  Once 05/15/22 1352 05/15/22 1616   05/15/22 1230  ceFAZolin (ANCEF) powder 1 g  Status:  Discontinued        1 g Other To Surgery 05/15/22 1140 05/15/22 1239   05/15/22 1230  ceFAZolin (ANCEF) IVPB 2g/100 mL premix        2 g 200 mL/hr over 30 Minutes Intravenous To Radiology 05/15/22 1140 05/15/22 1140   05/15/22 0000  ceFAZolin (ANCEF) IVPB 2g/100 mL premix        2 g 200  mL/hr over 30 Minutes Intravenous To Radiology 05/14/22 1308 05/15/22 0005   05/13/22 2134  meropenem (MERREM) 500 mg in sodium chloride 0.9 % 100 mL IVPB        500 mg 200 mL/hr over 30 Minutes Intravenous Every 24 hours 05/13/22 0815 05/17/22 0700   05/13/22 1000  meropenem (MERREM) 500 mg in sodium chloride 0.9 % 100 mL IVPB  Status:  Discontinued        500 mg 200 mL/hr over 30 Minutes Intravenous Every 12 hours 05/12/22 0940 05/12/22 0946   05/12/22 2200  meropenem (MERREM) 500 mg in sodium chloride 0.9 % 100 mL IVPB  Status:  Discontinued        500 mg 200 mL/hr over 30 Minutes Intravenous Every 12 hours 05/12/22 0946 05/13/22 0815   05/12/22 2000  meropenem (MERREM) 1 g in sodium chloride 0.9 % 100 mL IVPB  Status:  Discontinued        1 g 200 mL/hr over 30 Minutes Intravenous  Once 05/12/22 0940 05/12/22 0946   05/06/22 1530  meropenem (MERREM) 1 g in sodium chloride 0.9 % 100 mL IVPB  Status:  Discontinued        1  g 200 mL/hr over 30 Minutes Intravenous Every 8 hours 05/06/22 1438 05/12/22 0940   05/04/22 1000  vancomycin (VANCOCIN) IVPB 1000 mg/200 mL premix  Status:  Discontinued        1,000 mg 200 mL/hr over 60 Minutes Intravenous Every 24 hours 05/03/22 0802 05/08/22 0913   05/03/22 0845  vancomycin (VANCOREADY) IVPB 2000 mg/400 mL        2,000 mg 200 mL/hr over 120 Minutes Intravenous  Once 05/03/22 0751 05/03/22 1114   05/01/22 1800  piperacillin-tazobactam (ZOSYN) IVPB 3.375 g  Status:  Discontinued        3.375 g 12.5 mL/hr over 240 Minutes Intravenous Every 6 hours 05/01/22 1426 05/01/22 1427   05/01/22 1515  piperacillin-tazobactam (ZOSYN) IVPB 3.375 g  Status:  Discontinued        3.375 g 100 mL/hr over 30 Minutes Intravenous Every 6 hours 05/01/22 1428 05/06/22 1438   04/30/22 1400  piperacillin-tazobactam (ZOSYN) IVPB 2.25 g  Status:  Discontinued        2.25 g 100 mL/hr over 30 Minutes Intravenous Every 8 hours 04/30/22 1325 05/01/22 1426   04/29/22 1530   piperacillin-tazobactam (ZOSYN) IVPB 3.375 g  Status:  Discontinued        3.375 g 12.5 mL/hr over 240 Minutes Intravenous Every 8 hours 04/29/22 1433 04/30/22 1325   04/29/22 1145  metroNIDAZOLE (FLAGYL) IVPB 500 mg  Status:  Discontinued        500 mg 100 mL/hr over 60 Minutes Intravenous Every 12 hours 04/29/22 1047 04/29/22 1433   04/29/22 1145  vancomycin (VANCOCIN) IVPB 1000 mg/200 mL premix  Status:  Discontinued        1,000 mg 200 mL/hr over 60 Minutes Intravenous  Once 04/29/22 1047 04/29/22 1055   04/29/22 1145  vancomycin (VANCOREADY) IVPB 1500 mg/300 mL  Status:  Discontinued        1,500 mg 150 mL/hr over 120 Minutes Intravenous Every 24 hours 04/29/22 1055 04/29/22 1357   04/29/22 1045  ceFEPIme (MAXIPIME) 2 g in sodium chloride 0.9 % 100 mL IVPB  Status:  Discontinued        2 g 200 mL/hr over 30 Minutes Intravenous Every 8 hours 04/29/22 0954 04/29/22 1433   04/23/22 0730  ceFAZolin (ANCEF) IVPB 2g/100 mL premix        2 g 200 mL/hr over 30 Minutes Intravenous On call to O.R. 04/23/22 0726 04/23/22 0828       Assessment/Plan: Ms. Heminger is s/p robotic incisional hernia repair on 04/23/22 -Postop ileus complicated by aspiration: Ileus resolved, having bowel function.   - Inflammatory enteritis likely due to ischemic insult with dilated bowel at time of high pressor support - still some small bowel dilation on x-ray, having bowel movements, tube feeds. - Abdomen benign on exam. Slight abdominal pain - WBC normal - stop reglan - TF as tolerated   Acute metabolic encephalopathy superimposed on hx bipolar disorder, schizophrenia   ICU delirium- improved now off all IV infusions. Acute biventricular HFrEF (Etiology sepsis, stress induced CM. Has clean coronaries) Cardiogenic shock resolved, EF improved.  Acute hypoxic respiratory failure Aspiration PNA Pulm edema   RLL VAP  AKI with oliguria; no baseline CKD  Anemia, acute on chronic  DM2 with hyperglycemia   Severe protein calorie malnutrition  Pressure injury lip - improving with local care and trach [place] with multiple issues  Needs therapy and recouperation   Dispo - Okay for transfer to stepdown, Case  Management consult for placement - difficult with dialysis, vent needs   LOS: 28 days    Felicie Morn, MD   05/22/2022

## 2022-05-22 NOTE — Progress Notes (Signed)
Patient ID: Patricia [name], female   DOB: [birth year], 51 y.o.   MRN: [id number] S: No UOP recorded yesterday.  She has no new complaints today.  For HD today. AM labs just drawn.  Has been on TC with improving O2 requirement over past day.   O:BP 134/82   Pulse (!) 104   Temp 99.3 F (37.4 C) (Axillary)   Resp (!) 23   Ht '5\' 4"'$  (1.626 m)   Wt 109.6 kg   LMP 07/07/2014 (Approximate)   SpO2 98%   BMI 41.47 kg/m   Intake/Output Summary (Last 24 hours) at 05/22/2022 0806 Last data filed at 05/22/2022 0700 Gross per 24 hour  Intake 1450 ml  Output --  Net 1450 ml    Intake/Output: I/O last 3 completed shifts: In: 2145 [I.V.:10; NG/GT:2135] Out: 3000 [Other:3000]  Intake/Output this shift:  No intake/output data recorded. Weight change: -0.7 kg Gen:on trach collar, lying flat,  awake CVS: RRR Resp:on TC, bilateral chest rise Abd: +BS, soft, NT Ext: Trace edema in the bilateral hands with legs Neuro: awake  Recent Labs  Lab 05/18/22 0500 05/18/22 1553 05/19/22 0345 05/19/22 1521 05/20/22 0637 05/20/22 1626 05/21/22 0656  NA 133* 132* 133* 129* 130* 132* 133*  K 4.9 4.9 5.3* 5.4* 6.2* 5.4* 4.8  CL 95* 93* 92* 93* 92* 91* 93*  CO2 '26 25 23 24 22 23 27  '$ GLUCOSE 113* 105* 121* 123* 100* 119* 105*  BUN 85* 105* 122* 136* 158* 171* 89*  CREATININE 5.17* 6.08* 7.08* 7.69* 8.64* 9.00* 5.44*  ALBUMIN 2.0* 2.0* 1.9* 1.8* 1.8* 1.8* 1.8*  CALCIUM 8.5* 8.6* 8.9 8.3* 8.4* 8.6* 8.1*  PHOS 5.9* 7.5* 8.2* 8.7* 9.3* 9.7* 6.3*    Liver Function Tests: Recent Labs  Lab 05/20/22 0637 05/20/22 1626 05/21/22 0656  ALBUMIN 1.8* 1.8* 1.8*    No results for input(s): "LIPASE", "AMYLASE" in the last 168 hours. No results for input(s): "AMMONIA" in the last 168 hours. CBC: Recent Labs  Lab 05/16/22 0407 05/17/22 0459 05/18/22 0500 05/19/22 0345  WBC 6.2 5.8 5.6 5.0  HGB 7.7* 6.8* 7.9* 8.0*  HCT 23.1* 21.4* 23.7* 23.7*  MCV 84.0 85.9 84.6 84.3  PLT 271 269 279 303     Cardiac Enzymes: No results for input(s): "CKTOTAL", "CKMB", "CKMBINDEX", "TROPONINI" in the last 168 hours. CBG: Recent Labs  Lab 05/21/22 1612 05/21/22 1912 05/21/22 2318 05/22/22 0323 05/22/22 0759  GLUCAP 125* 134* 119* 146* 134*     Iron Studies:  No results for input(s): "IRON", "TIBC", "TRANSFERRIN", "FERRITIN" in the last 72 hours.  Studies/Results: No results found.  sodium chloride   Intravenous Once   Chlorhexidine Gluconate Cloth  6 each Topical Q0600   clonazePAM  0.5 mg Per Tube QHS   darbepoetin (ARANESP) injection - DIALYSIS  100 mcg Intravenous Q Fri-HD   feeding supplement (PROSource TF20)  60 mL Per Tube BID   fiber  1 packet Per Tube BID   heparin injection (subcutaneous)  5,000 Units Subcutaneous Q8H   HYDROmorphone HCl  1 mg Per Tube Q4H   Followed by   HYDROmorphone HCl  0.5 mg Per Tube Q4H   insulin aspart  0-20 Units Subcutaneous Q4H   insulin aspart  4 Units Subcutaneous Q4H   metoCLOPramide (REGLAN) injection  10 mg Intravenous Q6H   metoprolol tartrate  12.5 mg Per Tube BID   multivitamin  1 tablet Per Tube QHS   neomycin-bacitracin-polymyxin   Topical BID   nutrition supplement (  JUVEN)  1 packet Per Tube BID BM   mouth rinse  15 mL Mouth Rinse 4 times per day   pantoprazole  40 mg Per Tube Daily   QUEtiapine  50 mg Per Tube QHS   sodium chloride flush  10-40 mL Intracatheter Q12H   sodium zirconium cyclosilicate  10 g Per Tube BID    BMET    Component Value Date/Time   NA 133 (L) 05/21/2022 0656   NA 139 07/02/2014 1923   K 4.8 05/21/2022 0656   K 3.9 07/02/2014 1923   CL 93 (L) 05/21/2022 0656   CL 105 07/02/2014 1923   CO2 27 05/21/2022 0656   CO2 26 07/02/2014 1923   GLUCOSE 105 (H) 05/21/2022 0656   GLUCOSE 95 07/02/2014 1923   BUN 89 (H) 05/21/2022 0656   BUN 7 07/02/2014 1923   CREATININE 5.44 (H) 05/21/2022 0656   CREATININE 0.98 07/02/2014 1923   CALCIUM 8.1 (L) 05/21/2022 0656   CALCIUM 8.5 07/02/2014 1923    GFRNONAA 9 (L) 05/21/2022 0656   GFRNONAA >60 07/02/2014 1923   GFRAA >60 04/22/2018 2215   GFRAA >60 07/02/2014 1923   CBC    Component Value Date/Time   WBC 5.0 05/19/2022 0345   RBC 2.81 (L) 05/19/2022 0345   HGB 8.0 (L) 05/19/2022 0345   HGB 9.4 (L) 07/02/2014 1923   HCT 23.7 (L) 05/19/2022 0345   HCT 30.3 (L) 07/02/2014 1923   PLT 303 05/19/2022 0345   PLT 449 (H) 07/02/2014 1923   MCV 84.3 05/19/2022 0345   MCV 71 (L) 07/02/2014 1923   MCH 28.5 05/19/2022 0345   MCHC 33.8 05/19/2022 0345   RDW 15.5 05/19/2022 0345   RDW 18.8 (H) 07/02/2014 1923   LYMPHSABS 2.0 05/07/2022 0428   MONOABS 2.4 (H) 05/07/2022 0428   EOSABS 0.0 05/07/2022 0428   BASOSABS 0.1 05/07/2022 0428   Assessment/Plan:   AKI - likely ATN in the setting of cardiogenic and septic shock. CRRT 10/4-10/14/23 via RIJ temp HD catheter. First IHD 10/16.  No UOP over past 24h - will have bladder scan done as she had 800 day prior.  Ten[place] will keep HD on MWF schedule with continued assessment for recovery of renal function.  S/p [facility] 10/18 Avoid nephrotoxic medications including NSAIDs and iodinated intravenous contrast exposure unless the latter is absolutely indicated.  Preferred narcotic agents for pain control are hydromorphone, fentanyl, and methadone. Morphine should not be used. Avoid Baclofen and avoid oral sodium phosphate and magnesium citrate based laxatives / bowel preps. Continue strict Input and Output monitoring. Will monitor the patient closely with you and intervene or adjust therapy as indicated by changes in clinical status/labs  Cardiogenic/septic shock - off pressors now.  Heart failure team had been following. Likely due to aspiration pneumonia. Acute hypoxic respiratory failure - was re-intubated 05/05/22.  Extubated again on 10/14 but failed BiPAP, s/p trach and back on Vent 05/11/22 per PCCM. TC trials per CCM Infectious enteritis - seen on CT scan.   surgery following Anemia of critical  illness - transfuse for Hgb <7. Requiring PRBCs. Per primary. ESA started 10/18 Acute MI - normal coronaries by cath 10/4 Hyperkalemia: ongoing issue. On lokelma BID - K yest AM 4.8 - watch and d/c lokelma as able, 2K dialysate.   RD can ensure lower K feeds. Hyperphosphatemia: Receiving continuous feeds so binders of little efficacy.  Suggest decreasing phosphorus in feeds as able S/p robotic incisional hernia repair with mesh and LOA -  Surgery following.

## 2022-05-22 NOTE — Progress Notes (Signed)
   05/22/22 1845  Vitals  Temp 98.4 F (36.9 C)  Temp Source Oral  BP (!) 134/91  MAP (mmHg) 104  BP Location Right Arm  BP Method Automatic  Patient Position (if appropriate) Lying  Pulse Rate (!) 117  Pulse Rate Source Monitor  ECG Heart Rate (!) 119  Resp (!) 28  Oxygen Therapy  SpO2 99 %  O2 Device Tracheostomy Collar  FiO2 (%) 28 %  Pulse Oximetry Type Continuous   Received patient in bed to unit.  Alert and oriented.  Informed consent signed and in chart.   Treatment initiated: 1419  Treatment completed: 1830  Patient tolerated well.  Transported back to the room  Alert, without acute distress.  Hand-off given to patient's nurse.   Access used: HD cath Access issues: needed to be Reversed  Total UF removed: 3067m  Medication(s) given: Heparin Dwells 3200 units Post HD VS: see above Post HD weight: 105.4kg   [facility]

## 2022-05-22 NOTE — Progress Notes (Signed)
PT Cancellation Note  Patient Details Name: Patricia [name] MRN: [id number] DOB: [birth year]   Cancelled Treatment:    Reason Eval/Treat Not Completed: Patient at procedure or test/unavailable. Pt receiving dialysis in room at this time. PT will follow up as time allows.   Zenaida Niece 05/22/2022, 4:24 PM

## 2022-05-23 DIAGNOSIS — A419 Sepsis, unspecified organism: Secondary | ICD-10-CM | POA: Diagnosis not present

## 2022-05-23 DIAGNOSIS — R6521 Severe sepsis with septic shock: Secondary | ICD-10-CM | POA: Diagnosis not present

## 2022-05-23 LAB — RENAL FUNCTION PANEL
Albumin: 1.8 g/dL — ABNORMAL LOW (ref 3.5–5.0)
Anion gap: 11 (ref 5–15)
BUN: 57 mg/dL — ABNORMAL HIGH (ref 6–20)
CO2: 27 mmol/L (ref 22–32)
Calcium: 8.5 mg/dL — ABNORMAL LOW (ref 8.9–10.3)
Chloride: 97 mmol/L — ABNORMAL LOW (ref 98–111)
Creatinine, Ser: 3.5 mg/dL — ABNORMAL HIGH (ref 0.44–1.00)
GFR, Estimated: 15 mL/min — ABNORMAL LOW (ref >60)
Glucose, Bld: 128 mg/dL — ABNORMAL HIGH (ref 70–99)
Phosphorus: 4.4 mg/dL (ref 2.5–4.6)
Potassium: 4 mmol/L (ref 3.5–5.1)
Sodium: 135 mmol/L (ref 135–145)

## 2022-05-23 LAB — CBC
HCT: 24.2 % — ABNORMAL LOW (ref 36.0–46.0)
Hemoglobin: 7.8 g/dL — ABNORMAL LOW (ref 12.0–15.0)
MCH: 27.5 pg (ref 26.0–34.0)
MCHC: 32.2 g/dL (ref 30.0–36.0)
MCV: 85.2 fL (ref 80.0–100.0)
Platelets: 341 10*3/uL (ref 150–400)
RBC: 2.84 MIL/uL — ABNORMAL LOW (ref 3.87–5.11)
RDW: 15.5 % (ref 11.5–15.5)
WBC: 5.9 10*3/uL (ref 4.0–10.5)
nRBC: 0 % (ref 0.0–0.2)

## 2022-05-23 LAB — GLUCOSE, CAPILLARY
Glucose-Capillary: 111 mg/dL — ABNORMAL HIGH (ref 70–99)
Glucose-Capillary: 109 mg/dL — ABNORMAL HIGH (ref 70–99)
Glucose-Capillary: 122 mg/dL — ABNORMAL HIGH (ref 70–99)
Glucose-Capillary: 125 mg/dL — ABNORMAL HIGH (ref 70–99)
Glucose-Capillary: 104 mg/dL — ABNORMAL HIGH (ref 70–99)
Glucose-Capillary: 119 mg/dL — ABNORMAL HIGH (ref 70–99)
Glucose-Capillary: 112 mg/dL — ABNORMAL HIGH (ref 70–99)

## 2022-05-23 LAB — ZINC: Zinc: 34 ug/dL — ABNORMAL LOW (ref 44–115)

## 2022-05-23 LAB — MAGNESIUM: Magnesium: 2 mg/dL (ref 1.7–2.4)

## 2022-05-23 LAB — COPPER, SERUM: Copper: 40 ug/dL — ABNORMAL LOW (ref 80–158)

## 2022-05-23 MED ORDER — DARBEPOETIN ALFA 100 MCG/0.5ML IJ SOSY
100.0000 ug | PREFILLED_SYRINGE | INTRAMUSCULAR | Status: DC
  Filled 2022-05-23: qty 0.5

## 2022-05-23 MED ORDER — HYDROMORPHONE HCL 1 MG/ML IJ SOLN
0.5000 mg | Freq: Four times a day (QID) | INTRAMUSCULAR | Status: DC | PRN
  Administered 2022-05-25: 0.5 mg via INTRAVENOUS
  Filled 2022-05-23: qty 0.5

## 2022-05-23 MED ORDER — HYDROMORPHONE HCL 1 MG/ML IJ SOLN: 0.5000 mg | INTRAMUSCULAR | Status: DC

## 2022-05-23 MED ORDER — HYDROMORPHONE HCL 1 MG/ML PO LIQD
0.5000 mg | ORAL | Status: DC | PRN
  Administered 2022-05-23: 0.5 mg

## 2022-05-23 MED ORDER — HYDROMORPHONE HCL 1 MG/ML PO LIQD
0.5000 mg | ORAL | Status: AC
  Administered 2022-05-23 – 2022-05-25 (×12): 0.5 mg via ORAL
  Filled 2022-05-23 (×12): qty 1

## 2022-05-23 NOTE — Progress Notes (Signed)
Patient ID: Patricia [name], female   DOB: [birth year], 51 y.o.   MRN: [id number] 22 Days Post-Op    Subjective: Making urine with foley in place. Asking to use the bathroom  Objective: Vital signs in last 24 hours: Temp:  [98 F (36.7 C)-99.8 F (37.7 C)] 99.3 F (37.4 C) (10/26 0743) Pulse Rate:  [94-121] 109 (10/26 0432) Resp:  [16-28] 21 (10/25 2349) BP: (125-175)/(73-105) 131/83 (10/26 0432) SpO2:  [93 %-100 %] 96 % (10/26 0432) FiO2 (%):  [28 %] 28 % (10/26 0020) Weight:  [105.4 kg-111.2 kg] 111.2 kg (10/25 2349) Last BM Date : 05/22/22  Intake/Output from previous day: 10/25 0701 - 10/26 0700 In: 510 [I.V.:10; NG/GT:500] Out: 5240 [Urine:2240] Intake/Output this shift: No intake/output data recorded.  General appearance: cooperative Neck: trach GI: soft, NT  Lab Results: CBC  Recent Labs    05/22/22 0805 05/23/22 0027  WBC 5.4 5.9  HGB 7.6* 7.8*  HCT 23.3* 24.2*  PLT 376 341    BMET Recent Labs    05/22/22 0805 05/23/22 0027  NA 134* 135  K 5.2* 4.0  CL 91* 97*  CO2 26 27  GLUCOSE 131* 128*  BUN 122* 57*  CREATININE 7.37* 3.50*  CALCIUM 8.5* 8.5*    PT/INR No results for input(s): "LABPROT", "INR" in the last 72 hours.  ABG No results for input(s): "PHART", "HCO3" in the last 72 hours.  Invalid input(s): "PCO2", "PO2"  Studies/Results: DG Abd Portable 1V  Result Date: 05/22/2022 CLINICAL DATA:  Abdominal pain EXAM: PORTABLE ABDOMEN - 1 VIEW COMPARISON:  05/20/2022 FINDINGS: Feeding catheter is noted in the distal duodenum/proximal jejunum stable from the prior exam. No free air is seen. No obstructive changes are noted. No bony abnormality is seen. IMPRESSION: No acute abnormality noted. Electronically Signed   By: Patricia [name] M.D.   On: 05/22/2022 21:34    Anti-infectives: Anti-infectives (From admission, onward)    Start     Dose/Rate Route Frequency Ordered Stop   05/15/22 1445  ceFAZolin (ANCEF) IVPB 2g/100 mL premix       Note  to Pharmacy: To be given in IR   2 g 200 mL/hr over 30 Minutes Intravenous  Once 05/15/22 1352 05/15/22 1616   05/15/22 1230  ceFAZolin (ANCEF) powder 1 g  Status:  Discontinued        1 g Other To Surgery 05/15/22 1140 05/15/22 1239   05/15/22 1230  ceFAZolin (ANCEF) IVPB 2g/100 mL premix        2 g 200 mL/hr over 30 Minutes Intravenous To Radiology 05/15/22 1140 05/15/22 1140   05/15/22 0000  ceFAZolin (ANCEF) IVPB 2g/100 mL premix        2 g 200 mL/hr over 30 Minutes Intravenous To Radiology 05/14/22 1308 05/15/22 0005   05/13/22 2134  meropenem (MERREM) 500 mg in sodium chloride 0.9 % 100 mL IVPB        500 mg 200 mL/hr over 30 Minutes Intravenous Every 24 hours 05/13/22 0815 05/17/22 0700   05/13/22 1000  meropenem (MERREM) 500 mg in sodium chloride 0.9 % 100 mL IVPB  Status:  Discontinued        500 mg 200 mL/hr over 30 Minutes Intravenous Every 12 hours 05/12/22 0940 05/12/22 0946   05/12/22 2200  meropenem (MERREM) 500 mg in sodium chloride 0.9 % 100 mL IVPB  Status:  Discontinued        500 mg 200 mL/hr over 30 Minutes Intravenous Every 12 hours 05/12/22  9628 05/13/22 0815   05/12/22 2000  meropenem (MERREM) 1 g in sodium chloride 0.9 % 100 mL IVPB  Status:  Discontinued        1 g 200 mL/hr over 30 Minutes Intravenous  Once 05/12/22 0940 05/12/22 0946   05/06/22 1530  meropenem (MERREM) 1 g in sodium chloride 0.9 % 100 mL IVPB  Status:  Discontinued        1 g 200 mL/hr over 30 Minutes Intravenous Every 8 hours 05/06/22 1438 05/12/22 0940   05/04/22 1000  vancomycin (VANCOCIN) IVPB 1000 mg/200 mL premix  Status:  Discontinued        1,000 mg 200 mL/hr over 60 Minutes Intravenous Every 24 hours 05/03/22 0802 05/08/22 0913   05/03/22 0845  vancomycin (VANCOREADY) IVPB 2000 mg/400 mL        2,000 mg 200 mL/hr over 120 Minutes Intravenous  Once 05/03/22 0751 05/03/22 1114   05/01/22 1800  piperacillin-tazobactam (ZOSYN) IVPB 3.375 g  Status:  Discontinued        3.375 g 12.5  mL/hr over 240 Minutes Intravenous Every 6 hours 05/01/22 1426 05/01/22 1427   05/01/22 1515  piperacillin-tazobactam (ZOSYN) IVPB 3.375 g  Status:  Discontinued        3.375 g 100 mL/hr over 30 Minutes Intravenous Every 6 hours 05/01/22 1428 05/06/22 1438   04/30/22 1400  piperacillin-tazobactam (ZOSYN) IVPB 2.25 g  Status:  Discontinued        2.25 g 100 mL/hr over 30 Minutes Intravenous Every 8 hours 04/30/22 1325 05/01/22 1426   04/29/22 1530  piperacillin-tazobactam (ZOSYN) IVPB 3.375 g  Status:  Discontinued        3.375 g 12.5 mL/hr over 240 Minutes Intravenous Every 8 hours 04/29/22 1433 04/30/22 1325   04/29/22 1145  metroNIDAZOLE (FLAGYL) IVPB 500 mg  Status:  Discontinued        500 mg 100 mL/hr over 60 Minutes Intravenous Every 12 hours 04/29/22 1047 04/29/22 1433   04/29/22 1145  vancomycin (VANCOCIN) IVPB 1000 mg/200 mL premix  Status:  Discontinued        1,000 mg 200 mL/hr over 60 Minutes Intravenous  Once 04/29/22 1047 04/29/22 1055   04/29/22 1145  vancomycin (VANCOREADY) IVPB 1500 mg/300 mL  Status:  Discontinued        1,500 mg 150 mL/hr over 120 Minutes Intravenous Every 24 hours 04/29/22 1055 04/29/22 1357   04/29/22 1045  ceFEPIme (MAXIPIME) 2 g in sodium chloride 0.9 % 100 mL IVPB  Status:  Discontinued        2 g 200 mL/hr over 30 Minutes Intravenous Every 8 hours 04/29/22 0954 04/29/22 1433   04/23/22 0730  ceFAZolin (ANCEF) IVPB 2g/100 mL premix        2 g 200 mL/hr over 30 Minutes Intravenous On call to O.R. 04/23/22 0726 04/23/22 0828       Assessment/Plan: Patricia [name] is s/p robotic incisional hernia repair on 04/23/22 -Postop ileus complicated by aspiration: Ileus resolved, having bowel function.   - Inflammatory enteritis likely due to ischemic insult with dilated bowel at time of high pressor support - small bowel dilation on x-ray improved, having bowel movements, tube feeds. - Abdomen benign on exam. Slight abdominal pain - WBC normal - TF as  tolerated   Acute metabolic encephalopathy superimposed on hx bipolar disorder, schizophrenia   ICU delirium- improved now off all IV infusions. Acute biventricular HFrEF (Etiology sepsis, stress induced CM. Has clean coronaries) Cardiogenic shock resolved, EF  improved.  Acute hypoxic respiratory failure Aspiration PNA Pulm edema   RLL VAP  AKI with oliguria; no baseline CKD   - some signs of renal recovery with UOP last few days with Foley Anemia, acute on chronic  DM2 with hyperglycemia  Severe protein calorie malnutrition  Pressure injury lip - improving with local care and trach [place] with multiple issues  Needs therapy and recouperation   [place] in stepdown for trach care, case Management consult for placement - difficult with dialysis, vent needs, insurance limitations   LOS: 29 days    Felicie Morn, MD   05/23/2022

## 2022-05-23 NOTE — Progress Notes (Signed)
Orthopedic Tech Progress Note Patient Details:  Patricia [name] 1971/04/08 [id number]  Ortho Devices Type of Ortho Device: Haematologist Ortho Device/Splint Location: BLE Ortho Device/Splint Interventions: Application, Ordered   Post Interventions Patient Tolerated: Well Instructions Provided: Adjustment of device, Care of device  Patricia [name] [name] 05/23/2022, 3:36 PM

## 2022-05-23 NOTE — Progress Notes (Signed)
Patient ID: Patricia [name], female   DOB: 23-Mar-1971, 51 y.o.   MRN: [id number] S: Had HD yesterday - completed tx, 3L UF.  UOP documented 2.24L and she now has a foley - was retaining on several occasions.  No new issues noted.  She's sleepy this AM.  O:BP 131/83 (BP Location: Left Wrist)   Pulse (!) 109   Temp 99.3 F (37.4 C) (Oral)   Resp (!) 21   Ht '5\' 4"'$  (1.626 m)   Wt 111.2 kg   LMP 07/07/2014 (Approximate)   SpO2 96%   BMI 42.09 kg/m   Intake/Output Summary (Last 24 hours) at 05/23/2022 0833 Last data filed at 05/23/2022 0558 Gross per 24 hour  Intake 360 ml  Output 5240 ml  Net -4880 ml    Intake/Output: I/O last 3 completed shifts: In: 1360 [I.V.:10; NG/GT:1350] Out: 1027 [Urine:2240; Other:3000]  Intake/Output this shift:  No intake/output data recorded. Weight change: -0.9 kg Gen:on trach collar, lying flat,  awake CVS: RRR Resp:on TC, bilateral chest rise Abd: +BS, soft, NT Ext: Trace edema in the bilateral hands with legs Neuro: sleepy, arousable GU: foley with clear yellow urine  Recent Labs  Lab 05/19/22 0345 05/19/22 1521 05/20/22 0637 05/20/22 1626 05/21/22 0656 05/22/22 0805 05/23/22 0027  NA 133* 129* 130* 132* 133* 134* 135  K 5.3* 5.4* 6.2* 5.4* 4.8 5.2* 4.0  CL 92* 93* 92* 91* 93* 91* 97*  CO2 '23 24 22 23 27 26 27  '$ GLUCOSE 121* 123* 100* 119* 105* 131* 128*  BUN 122* 136* 158* 171* 89* 122* 57*  CREATININE 7.08* 7.69* 8.64* 9.00* 5.44* 7.37* 3.50*  ALBUMIN 1.9* 1.8* 1.8* 1.8* 1.8* 1.7* 1.8*  CALCIUM 8.9 8.3* 8.4* 8.6* 8.1* 8.5* 8.5*  PHOS 8.2* 8.7* 9.3* 9.7* 6.3* 7.2* 4.4    Liver Function Tests: Recent Labs  Lab 05/21/22 0656 05/22/22 0805 05/23/22 0027  ALBUMIN 1.8* 1.7* 1.8*    No results for input(s): "LIPASE", "AMYLASE" in the last 168 hours. No results for input(s): "AMMONIA" in the last 168 hours. CBC: Recent Labs  Lab 05/17/22 0459 05/18/22 0500 05/19/22 0345 05/22/22 0805 05/23/22 0027  WBC 5.8 5.6 5.0 5.4  5.9  HGB 6.8* 7.9* 8.0* 7.6* 7.8*  HCT 21.4* 23.7* 23.7* 23.3* 24.2*  MCV 85.9 84.6 84.3 85.0 85.2  PLT 269 279 303 376 341    Cardiac Enzymes: No results for input(s): "CKTOTAL", "CKMB", "CKMBINDEX", "TROPONINI" in the last 168 hours. CBG: Recent Labs  Lab 05/22/22 1917 05/22/22 2308 05/23/22 0303 05/23/22 0425 05/23/22 0757  GLUCAP 132* 131* 111* 109* 122*     Iron Studies:  No results for input(s): "IRON", "TIBC", "TRANSFERRIN", "FERRITIN" in the last 72 hours.  Studies/Results: DG Abd Portable 1V  Result Date: 05/22/2022 CLINICAL DATA:  Abdominal pain EXAM: PORTABLE ABDOMEN - 1 VIEW COMPARISON:  05/20/2022 FINDINGS: Feeding catheter is noted in the distal duodenum/proximal jejunum stable from the prior exam. No free air is seen. No obstructive changes are noted. No bony abnormality is seen. IMPRESSION: No acute abnormality noted. Electronically Signed   By: Inez Catalina M.D.   On: 05/22/2022 21:34    Chlorhexidine Gluconate Cloth  6 each Topical Q0600   clonazePAM  0.5 mg Per Tube QHS   darbepoetin (ARANESP) injection - DIALYSIS  100 mcg Intravenous Q Fri-HD   feeding supplement (PROSource TF20)  60 mL Per Tube BID   fiber  1 packet Per Tube BID   heparin injection (subcutaneous)  5,000 Units  Subcutaneous Q8H    HYDROmorphone (DILAUDID) injection  0.5 mg Intravenous Q4H   insulin aspart  0-20 Units Subcutaneous Q4H   insulin aspart  4 Units Subcutaneous Q4H   metoprolol tartrate  12.5 mg Per Tube BID   multivitamin  1 tablet Per Tube QHS   neomycin-bacitracin-polymyxin   Topical BID   nutrition supplement (JUVEN)  1 packet Per Tube BID BM   mouth rinse  15 mL Mouth Rinse 4 times per day   pantoprazole  40 mg Per Tube Daily   QUEtiapine  50 mg Per Tube QHS   sodium chloride flush  10-40 mL Intracatheter Q12H    BMET    Component Value Date/Time   NA 135 05/23/2022 0027   NA 139 07/02/2014 1923   K 4.0 05/23/2022 0027   K 3.9 07/02/2014 1923   CL 97 (L)  05/23/2022 0027   CL 105 07/02/2014 1923   CO2 27 05/23/2022 0027   CO2 26 07/02/2014 1923   GLUCOSE 128 (H) 05/23/2022 0027   GLUCOSE 95 07/02/2014 1923   BUN 57 (H) 05/23/2022 0027   BUN 7 07/02/2014 1923   CREATININE 3.50 (H) 05/23/2022 0027   CREATININE 0.98 07/02/2014 1923   CALCIUM 8.5 (L) 05/23/2022 0027   CALCIUM 8.5 07/02/2014 1923   GFRNONAA 15 (L) 05/23/2022 0027   GFRNONAA >60 07/02/2014 1923   GFRAA >60 04/22/2018 2215   GFRAA >60 07/02/2014 1923   CBC    Component Value Date/Time   WBC 5.9 05/23/2022 0027   RBC 2.84 (L) 05/23/2022 0027   HGB 7.8 (L) 05/23/2022 0027   HGB 9.4 (L) 07/02/2014 1923   HCT 24.2 (L) 05/23/2022 0027   HCT 30.3 (L) 07/02/2014 1923   PLT 341 05/23/2022 0027   PLT 449 (H) 07/02/2014 1923   MCV 85.2 05/23/2022 0027   MCV 71 (L) 07/02/2014 1923   MCH 27.5 05/23/2022 0027   MCHC 32.2 05/23/2022 0027   RDW 15.5 05/23/2022 0027   RDW 18.8 (H) 07/02/2014 1923   LYMPHSABS 2.0 05/07/2022 0428   MONOABS 2.4 (H) 05/07/2022 0428   EOSABS 0.0 05/07/2022 0428   BASOSABS 0.1 05/07/2022 0428   Assessment/Plan:   AKI - likely ATN in the setting of cardiogenic and septic shock. CRRT 10/4-10/14/23 via RIJ temp HD catheter. First IHD 10/16.  Remains dialysis dependent based on intradialytic Cr rise, however urine outpt noted to have remarkably improved and foley in place now after retention Tenatively will keep HD on MWF schedule with continued assessment for recovery of renal function - 2nd shift requested so we can assess labs.  S/p [facility] 10/18 Avoid nephrotoxic medications including NSAIDs and iodinated intravenous contrast exposure unless the latter is absolutely indicated.  Preferred narcotic agents for pain control are hydromorphone, fentanyl, and methadone. Morphine should not be used. Avoid Baclofen and avoid oral sodium phosphate and magnesium citrate based laxatives / bowel preps. Continue strict Input and Output monitoring. Will monitor the  patient closely with you and intervene or adjust therapy as indicated by changes in clinical status/labs  Cardiogenic/septic shock - off pressors now.  Heart failure team had been following. Likely due to aspiration pneumonia. Acute hypoxic respiratory failure - was re-intubated 05/05/22.  Extubated again on 10/14 but failed BiPAP, s/p trach and back on Vent 05/11/22 per PCCM > now tolerating TC 24h/d Anemia of critical illness - transfuse for Hgb <7. Requiring PRBCs. Per primary. ESA started 10/18 Acute MI - normal coronaries by cath 10/4 Hyperkalemia:  normal today off lokelma now.  RD assessed feeds - optimized given the anatomic location of her tube.  Cont daily monitoring.  Hyperphosphatemia: Receiving continuous feeds so binders of little efficacy. RD optimized feeds.  Phos normal today. S/p robotic incisional hernia repair with mesh and LOA - Surgery following.

## 2022-05-23 NOTE — Progress Notes (Signed)
Speech Language Pathology Treatment: Patricia [name] Speaking valve  Patient Details Name: Patricia [name] MRN: [id number] DOB: 1971/02/23 Today's Date: 05/23/2022 Time: 7741-4239 SLP Time Calculation (min) (ACUTE ONLY): 20 min  Assessment / Plan / Recommendation Clinical Impression  Patient seen by SLP for skilled treatment focused on PMV toleration. Patient had just been hoyer lifted into bed from recliner but was awake and alert and receptive to PMV trial. At rest, RR was 22-26, SpO2 was 97%. Cuff was already deflated when SLP arrived. Patient tolerated PMV for total of 10-12 minutes with SLP taking it off periodically to check for breath stacking but this was not observed. Patient able to achieve voicing which was low in intensity, hoarse and very dysphonic. She was able to verbally respond to SLP's questions and speak at single word and 2-word phrase level, telling SLP that she has 5 kids and one of her daughters lives in [place] and is in "fashion design". During PMV trial, patient's SpO2 did decrease to 91-94%. At one point she gestured to her mouth to communicate 'eat'. SLP explained to patient that we are working towards PO intake but have to move forward cautiously and slowly towards this goals. She nodded that she understood. After initially being able to achieve voicing, towards end of PMV donning, patient started to not be able to achieve phonation and RR was increasing to mid-high 30's. In addition, patient exhibiting visible signs of more rapid clavicular breathing. SLP doffed PMV, however no significant change in RR. On two occasions, she was observed to very briefly (not more than 2 seconds) stare and appear to be holding breath. She was not able to verbally or non-verbally respond to SLP when asked how she was feeling, how her breathing felt, etc. SLP will continue to follow patient for PMV usage/toleration and will assess PO toleration when appropriate.     HPI HPI: Ms. Patricia [name] is a 51 y.o.  female who presented to hospital for excisional hernia repair (robotic hernia repair 04/23/22) with mesh, ileus post op on TNA. Developed vomiting and hypotension after NG came out on 04/29/22 and PNA on CXR, was tachycardic and BP improved with IV fluids. DX with sepsis and ABX started. Pt admitted to ICU on 10/2.  Was placed on levophed. Early AM on 10/3 was intubated for worsening metabolic/lactic acidosis. 10/4 cardiac cath with EF 15%; She had AKI as well. Started CRRT. Extubation 10/14 failed; underwent tracheostomy. TC trials started 10/16.      SLP Plan  Continue with current plan of care      Recommendations for follow up therapy are one component of a multi-disciplinary discharge planning process, led by the attending physician.  Recommendations may be updated based on patient status, additional functional criteria and insurance authorization.    Recommendations         Patient may use Passy-Muir Speech Valve: with SLP only PMSV Supervision: Full MD: Please consider changing trach tube to : Cuffless         Oral Care Recommendations: Oral care QID Follow Up Recommendations: Skilled nursing-short term rehab (<3 hours/day) Assistance recommended at discharge: None SLP Visit Diagnosis: Aphonia (R49.1) Plan: Continue with current plan of care          Patricia Baller, MA, CCC-SLP Speech Therapy

## 2022-05-23 NOTE — Progress Notes (Addendum)
Physical Therapy Treatment Patient Details Name: Patricia [name] MRN: [id number] DOB: 17-Feb-1971 Today's Date: 05/23/2022   History of Present Illness 51 yo female admitted 04/23/22 for excisional hernia repair with mesh, ileus post op on TNA. Developed vomiting and hypotension after NGT removed 04/29/22 and PNA on CXR, was tachycardic. DX with sepsis and ABX started. Pt admitted to ICU on 10/2 on levophed. 10/3 intubated for worsening metabolic/lactic acidosis. 10/4 cardiac cath with EF 15%; She had AKI as well. CRRT 10/5-10/14. Failed extubation 10/8 and 10/14 with trach 10/14. PMhx: depression, OSA, asthma, schizophrenia, DM, insomnia    PT Comments    Pt is demonstrating good progress with mobility, clearing her buttocks to stand 2x using the stedy with modAx2 today. She remains limited by muscular power, strength, and endurance deficits, only tolerating standing up to ~15 sec durations and needing the surface to be elevated prior to transferring to stand. Goals were updated and upgraded due to her good progression. As pt is beginning to demonstrate gradual progress with mobility and has good potential for further progress in addition to having been IND prior to admission with a lengthy, complicated hospitalization, updated d/c recs back to [facility]. Will continue to follow acutely.     Recommendations for follow up therapy are one component of a multi-disciplinary discharge planning process, led by the attending physician.  Recommendations may be updated based on patient status, additional functional criteria and insurance authorization.  Follow Up Recommendations  Acute inpatient rehab (3hours/day) Can patient physically be transported by private vehicle: No   Assistance Recommended at Discharge Frequent or constant Supervision/Assistance  Patient can return home with the following Two people to help with walking and/or transfers;Two people to help with bathing/dressing/bathroom;Assistance  with cooking/housework;Assistance with feeding;Direct supervision/assist for medications management;Direct supervision/assist for financial management;Assist for transportation;Help with stairs or ramp for entrance   Equipment Recommendations  Hospital bed;Wheelchair (measurements PT);Wheelchair cushion (measurements PT)    Recommendations for Other Services Rehab consult     Precautions / Restrictions Precautions Precautions: Fall Precaution Comments: trach collar, cortrak Restrictions Weight Bearing Restrictions: No     Mobility  Bed Mobility Overal bed mobility: Needs Assistance Bed Mobility: Rolling, Sidelying to Sit Rolling: Mod assist, +2 for physical assistance Sidelying to sit: +2 for physical assistance, Max assist, HOB elevated       General bed mobility comments: Pt needing modAx2 to bring and maintain R foot on bed surface and grab L bed rail to pull to roll onto L side. MaxAx2 to manage legs off EOB and ascend trunk, cuing pt to bring elbow under her to push up and initiate leg movement.    Transfers Overall transfer level: Needs assistance Equipment used: Ambulation equipment used Transfers: Sit to/from Stand, Bed to chair/wheelchair/BSC Sit to Stand: +2 physical assistance, Mod assist, +2 safety/equipment, From elevated surface           General transfer comment: Using bed pad to assist in powering pt up to stand and extending her hips to clear stedy flaps, pt required modAx2 to come to stand from elevated EOB 1x and from stedy flaps 1x using the stedy. Cues provided to bring abdoment to bar and extend arms to improve upright posture, improved success on second rep. Transferred pt from bed > recliner via stedy. Transfer via Lift Equipment: Stedy  Ambulation/Gait               General Gait Details: unable at this time   Stairs  Wheelchair Mobility    Modified Rankin (Stroke Patients Only)       Balance Overall balance  assessment: Needs assistance Sitting-balance support: Bilateral upper extremity supported, Feet supported Sitting balance-Leahy Scale: Poor Sitting balance - Comments: pt requiring close min guard   Standing balance support: Bilateral upper extremity supported Standing balance-Leahy Scale: Poor Standing balance comment: ModAx2 and bil UE support to stand in stedy 2x up to ~15 sec durations                            Cognition Arousal/Alertness: Awake/alert Behavior During Therapy: Flat affect Overall Cognitive Status: Difficult to assess (secondary to trach)                                 General Comments: pt able to follow simple commands consistently, but needing extra time for processing; pt seems confused as she would gesture "I don't know" to simple questions at times        Exercises      General Comments General comments (skin integrity, edema, etc.): VSS on RA; HR up to 120s      Pertinent Vitals/Pain Pain Assessment Pain Assessment: Faces Faces Pain Scale: Hurts a little bit Pain Location: grimacing to movement in general Pain Descriptors / Indicators: Grimacing Pain Intervention(s): Limited activity within patient's tolerance, Monitored during session, Repositioned    Home Living                          Prior Function            PT Goals (current goals can now be found in the care plan section) Acute Rehab PT Goals Patient Stated Goal: unable to state; gestures to wanting to progress to OOB to chair today PT Goal Formulation: Patient unable to participate in goal setting Time For Goal Achievement: 06/06/22 Potential to Achieve Goals: Good Progress towards PT goals: Progressing toward goals    Frequency    Min 3X/week      PT Plan Discharge plan needs to be updated    Co-evaluation PT/OT/SLP Co-Evaluation/Treatment: Yes Reason for Co-Treatment: Necessary to address cognition/behavior during functional  activity;For patient/therapist safety;To address functional/ADL transfers;Complexity of the patient's impairments (multi-system involvement) PT goals addressed during session: Mobility/safety with mobility;Balance;Proper use of DME        AM-PAC PT "6 Clicks" Mobility   Outcome Measure  Help needed turning from your back to your side while in a flat bed without using bedrails?: A Lot Help needed moving from lying on your back to sitting on the side of a flat bed without using bedrails?: Total Help needed moving to and from a bed to a chair (including a wheelchair)?: Total Help needed standing up from a chair using your arms (e.g., wheelchair or bedside chair)?: Total Help needed to walk in hospital room?: Total Help needed climbing 3-5 steps with a railing? : Total 6 Click Score: 7    End of Session Equipment Utilized During Treatment: Oxygen;Gait belt (via trach collar) Activity Tolerance: Patient tolerated treatment well Patient left: with call bell/phone within reach;in chair;with chair alarm set Nurse Communication: Mobility status;Need for lift equipment PT Visit Diagnosis: Muscle weakness (generalized) (M62.81);Difficulty in walking, not elsewhere classified (R26.2);Other abnormalities of gait and mobility (R26.89);Unsteadiness on feet (R26.81)     Time: 1062-6948 PT Time Calculation (min) (ACUTE ONLY): 31  min  Charges:  $Therapeutic Activity: 8-22 mins                     Moishe Spice, PT, DPT Acute Rehabilitation Services  Office: [place] 05/23/2022, 9:00 AM

## 2022-05-23 NOTE — Progress Notes (Signed)
Pt suctioned and a small amount was suctioned out. Refused a second pass to continue to get out mucus. Pt still sounds as if there is a lot of mucus still in lungs.

## 2022-05-23 NOTE — Progress Notes (Signed)
NAME:  Patricia [name], MRN:  [id number], DOB:  02-Nov-1970, LOS: 86 ADMISSION DATE:  04/23/2022, CONSULTATION DATE:  04/29/2022 REFERRING MD:  Patricia Morn, MD, CHIEF COMPLAINT:   Small bowel obstruction, aspiration event, sepsis, respiratory failure    History of Present Illness:  Patricia [name] with above hx and nuc study in 2015 neg for ischemia done for chest pain and echo with EF 60-65% presented to hospital for excisional hernia repair (robotic hernia repair 04/23/22) with mesh, ileus post op on TNA. Developed vomiting and hypotension after NG came out on 04/29/22 and PNA on CXR, was tachycardic and BP improved with IV fluids. DX with sepsis and ABX started. At one point prolonged Qtc. Pt admitted to ICU on 10/2.  Was placed on levophed. Early AM on 10/3 was intubated for worsening metabolic/lactic acidosis. She had AKI as well. Crt was 6.01, her normal 0.9. Today Cr is 4 on CRRT.  Pertinent  Medical History  DM2 Depression OSA Migraines  Asthma Obesity Schizophrenia HTN HLD  Significant Hospital Events: Including procedures, antibiotic start and stop dates in addition to other pertinent events   9/26 Robotic hernia repair complicated by abd adhesions.  10/2 CT + for developing SBO and bilat lower lobe opacities most likely aspiration PNA. Progressive decompensation requiring transfer to ICU 10/2 PCCM consulted for respiratory failure & shock 10\3 Intubated for resp failure. CVL and arterial line placed. 04/30/22: EF 30-35%, RV moderately reduced, mild to moderate MR, IVC collapsed throughout respiratory cycle 10\4 EKG changes/trop increased--> and taken to cath lab - No obstructive CAD, but in decompensated heart failure, Bedside echo with EF 15%, new WMA, moderate RV dysfunction, trivial pericardial effusion.underwent emergent L/RHC. Normal coronaries, RA mean 13, PA mean 29, LVEDP 16, PCWP 19, CO/CI 3.96/1.87   returned to CICU post-cath. Right heart cath placed right fem.  Right IJ HD cath placed.  10/5 advanced HF team following, shock supported on norepi/vasopressin and Dobutamine. On CRRT.  10/6 weaning Norepi. Starting to wean fent. Needing restraints.  10/8: Reintubated due to neuromuscular weakness.  Bedside echo demonstrating improved RV function, LV function remained depressed 10/9 CT abdomen pelvis.suggesting wall thickening throughout the proximal and mid small bowel consistent with possible infectious or inflammatory enteritis there is also bibasilar airspace disease which could reflect pneumonia.  Antibiotic coverage widened.  Zosyn changed to meropenem.  Tolerating trickle feeds still on TPN 10/10 stopped stress dose steroids.  Resumed subcutaneous heparin. 10/13 albumin for coox decrease. Weaning vent . Failed SBT. At goal TF. 10/14 extubated to bipap failed due to weakness, trached 10/15 trial off CRRT, lasix 10/18 RIJ permcath 10/22 transfer 4N as a lateral after aspiration event briefly requiring mechanical ventilation 10/25 transfer to Cylinder progressive   Interim History / Subjective:   Increased UOP since foley placement. 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   Objective   Blood pressure (!) 134/92, pulse 100, temperature 98.8 F (37.1 C), temperature source Oral, resp. rate (!) 21, height '5\' 4"'$  (1.626 m), weight 111.2 kg, last menstrual period 07/07/2014, SpO2 98 %.    FiO2 (%):  [28 %] 28 %   Intake/Output Summary (Last 24 hours) at 05/23/2022 1408 Last data filed at 05/23/2022 1139 Gross per 24 hour  Intake 360 ml  Output 5090 ml  Net -4730 ml   Filed Weights   05/22/22 1335 05/22/22 1845 05/22/22 2349  Weight: 108.7 kg 105.4 kg 111.2 kg    Examination: Physical exam: General appearance: 51  y.o., female, NAD Eyes: PERRL tracking Neck: 66 shiley cuffed in place with trach collar Lungs: rhonchorous bl, with normal respiratory effort CV: borderline tachy RR, no murmur  Abdomen: Soft, mildly ttp;  non-distended, BS hypoactive  Extremities: 1+ dependent edema, warm Skin: Normal turgor and texture; no rash Neuro: grossly nonfocal, follows commands  Labs/imaging reviewed K 4.0 Bicarb 27 Albumin 1.8  Assessment & Plan:   Acute metabolic encephalopathy superimposed on hx bipolar disorder, schizophrenia   Continue dilaudid taper Continue klonopin 0.5 qhs Continue seroquel 50 qhs  Acute hypoxic respiratory failure d/t Aspiration PNA, (RLL), resolved Prolonged mechanical ventilation sp tracheostomy Pulmonary edema   Monitor Is/Os HD for target euvolemia but UOP appears to be picking up as below Routine trach care  Acute biventricular HFrEF (Etiology sepsis, stress induced CM. Has clean coronaries) Cardiogenic shock resolved, EF improved.  Monitor Is/Os Continue metoprolol 12.5 BID Unable to tolerate addition of ACEi in setting AKI  AKI due to septic ATN, no renal recovery Hyponatremia Hyperkalemia Nephrology following on iHD Monitor electrolytes, UOP for evidence of renal recovery  S/p hernia repair &  LOA inflammatory enteritis  Continue TF as tolerated Postop care by surgical team  Anemia, acute on chronic  Monitor for any signs of bleeding Transfusion threshold for hemoglobin less than 7  Acute urinary retention Maintain foley 10/26- as we watch for renal recovery  DM2 with hyperglycemia  SSI  Severe protein calorie malnutrition  Morbid obesity Continue tube feeds  Pressure injury- lip in the setting of ET tube: Not present on admission Wound care team   Best Practice (right click and "Reselect all SmartList Selections" daily)   Diet/type: tube feeds DVT prophylaxis: prophylactic heparin  GI prophylaxis: PPI Lines: Dialysis Catheter Foley:  N/A and Yes, and it is still needed  Code Status:  full code   Will follow periodically for trach care  [place] for pager If no response to pager, please  call [phone] until 7pm After 7pm, Please call E-link [phone]

## 2022-05-23 NOTE — Progress Notes (Signed)
° °  Inpatient Rehab Admissions Coordinator : ° °Per therapy change in recommendations, patient was screened for CIR candidacy by Braxxton Stoudt RN MSN.  At this time patient appears to be a potential candidate for CIR. I will place a rehab consult per protocol for full assessment. Please call me with any questions. ° °Cheyenne Schumm RN MSN °Admissions Coordinator °[phone] °  °

## 2022-05-23 NOTE — Progress Notes (Signed)
Occupational Therapy Treatment Patient Details Name: Patricia [name] MRN: [id number] DOB: 1971/06/05 Today's Date: 05/23/2022   History of present illness 51 yo female admitted 04/23/22 for excisional hernia repair with mesh, ileus post op on TNA. Developed vomiting and hypotension after NGT removed 04/29/22 and PNA on CXR, was tachycardic. DX with sepsis and ABX started. Pt admitted to ICU on 10/2 on levophed. 10/3 intubated for worsening metabolic/lactic acidosis. 10/4 cardiac cath with EF 15%; She had AKI as well. CRRT 10/5-10/14. Failed extubation 10/8 and 10/14 with trach 10/14. PMhx: depression, OSA, asthma, schizophrenia, DM, insomnia   OT comments  Pt with improved strength. Requiring +2 max for bed mobility, but able to stand with use of stedy and +2  mod assist. Pt with HR to 122 bpm, fatigues easily. Pt with flat affect and slow processing speed. She was able to use an oral swab for mouth care with supervision once in chair. Updated d/c recommendation to [facility] given pt's progress.    Recommendations for follow up therapy are one component of a multi-disciplinary discharge planning process, led by the attending physician.  Recommendations may be updated based on patient status, additional functional criteria and insurance authorization.    Follow Up Recommendations  Acute inpatient rehab (3hours/day)    Assistance Recommended at Discharge Frequent or constant Supervision/Assistance  Patient can return home with the following  Two people to help with walking and/or transfers;Two people to help with bathing/dressing/bathroom;Assist for transportation;Help with stairs or ramp for entrance;Assistance with cooking/housework;Direct supervision/assist for medications management   Equipment Recommendations  Other (comment) (defer to next venue of care)    Recommendations for Other Services      Precautions / Restrictions Precautions Precautions: Fall Precaution Comments: trach collar,  cortrak, foley Restrictions Weight Bearing Restrictions: No       Mobility Bed Mobility Overal bed mobility: Needs Assistance Bed Mobility: Rolling, Sidelying to Sit Rolling: Mod assist, +2 for physical assistance Sidelying to sit: +2 for physical assistance, Max assist, HOB elevated       General bed mobility comments: cues for technique assist of bed pad to roll, assist for LEs over EOB and to raise trunk    Transfers Overall transfer level: Needs assistance Equipment used: Ambulation equipment used Transfers: Sit to/from Stand Sit to Stand: +2 physical assistance, Mod assist, From elevated surface           General transfer comment: Using bed pad to assist in powering pt up to stand and extending her hips to clear stedy flaps, pt required modAx2 to come to stand from elevated EOB 1x and from stedy flaps 1x using the stedy. Cues provided to bring abdoment to bar and extend arms to improve upright posture, improved success on second rep. Transferred pt from bed > recliner via stedy. Fatigues easily. Transfer via Lift Equipment: [employer] Overall balance assessment: Needs assistance   Sitting balance-Leahy Scale: Poor Sitting balance - Comments: pt requiring close min guard, props on B UEs vs holding therapist's hands   Standing balance support: Bilateral upper extremity supported Standing balance-Leahy Scale: Poor                             ADL either performed or assessed with clinical judgement   ADL Overall ADL's : Needs assistance/impaired     Grooming: Oral care;Sitting Grooming Details (indicate cue type and reason): swabbed mouth with toothette  Lower Body Dressing: Total assistance;Bed level Lower Body Dressing Details (indicate cue type and reason): to don socks                    Extremity/Trunk Assessment              Vision       Perception     Praxis      Cognition Arousal/Alertness:  Awake/alert Behavior During Therapy: Flat affect Overall Cognitive Status: Difficult to assess                                 General Comments: slow processing, able to follow simple commands within context of activity        Exercises      Shoulder Instructions       General Comments VSS on RA; HR up to 120s    Pertinent Vitals/ Pain       Pain Assessment Pain Assessment: Faces Faces Pain Scale: Hurts a little bit Pain Location: grimacing to movement in general Pain Descriptors / Indicators: Grimacing Pain Intervention(s): Monitored during session, Repositioned  Home Living                                          Prior Functioning/Environment              Frequency  Min 2X/week        Progress Toward Goals  OT Goals(current goals can now be found in the care plan section)  Progress towards OT goals: Progressing toward goals  Acute Rehab OT Goals OT Goal Formulation: With patient Time For Goal Achievement: 05/23/22 Potential to Achieve Goals: Good  Plan Discharge plan needs to be updated    Co-evaluation    PT/OT/SLP Co-Evaluation/Treatment: Yes Reason for Co-Treatment: Necessary to address cognition/behavior during functional activity;For patient/therapist safety PT goals addressed during session: Mobility/safety with mobility;Balance;Proper use of DME OT goals addressed during session: ADL's and self-care;Strengthening/ROM      AM-PAC OT "6 Clicks" Daily Activity     Outcome Measure   Help from another person eating meals?: Total Help from another person taking care of personal grooming?: A Lot Help from another person toileting, which includes using toliet, bedpan, or urinal?: Total Help from another person bathing (including washing, rinsing, drying)?: Total Help from another person to put on and taking off regular upper body clothing?: Total Help from another person to put on and taking off regular lower  body clothing?: Total 6 Click Score: 7    End of Session Equipment Utilized During Treatment: Oxygen (5L 28%)  OT Visit Diagnosis: Unsteadiness on feet (R26.81);Muscle weakness (generalized) (M62.81);Other symptoms and signs involving cognitive function   Activity Tolerance Patient tolerated treatment well   Patient Left in chair;with call bell/phone within reach;with chair alarm set   Nurse Communication Mobility status;Need for lift equipment        Time: 0277-4128 OT Time Calculation (min): 31 min  Charges: OT General Charges $OT Visit: 1 Visit OT Treatments $Therapeutic Activity: 8-22 mins  Cleta Alberts, OTR/L Acute Rehabilitation Services Office: [phone]   Patricia [name] 05/23/2022, 10:28 AM

## 2022-05-24 DIAGNOSIS — R6521 Severe sepsis with septic shock: Secondary | ICD-10-CM | POA: Diagnosis not present

## 2022-05-24 DIAGNOSIS — A419 Sepsis, unspecified organism: Secondary | ICD-10-CM | POA: Diagnosis not present

## 2022-05-24 LAB — RENAL FUNCTION PANEL
Albumin: 1.9 g/dL — ABNORMAL LOW (ref 3.5–5.0)
Anion gap: 9 (ref 5–15)
BUN: 82 mg/dL — ABNORMAL HIGH (ref 6–20)
CO2: 27 mmol/L (ref 22–32)
Calcium: 9.2 mg/dL (ref 8.9–10.3)
Chloride: 101 mmol/L (ref 98–111)
Creatinine, Ser: 3.8 mg/dL — ABNORMAL HIGH (ref 0.44–1.00)
GFR, Estimated: 14 mL/min — ABNORMAL LOW (ref >60)
Glucose, Bld: 141 mg/dL — ABNORMAL HIGH (ref 70–99)
Phosphorus: 4.7 mg/dL — ABNORMAL HIGH (ref 2.5–4.6)
Potassium: 3.8 mmol/L (ref 3.5–5.1)
Sodium: 137 mmol/L (ref 135–145)

## 2022-05-24 LAB — CBC
HCT: 25.7 % — ABNORMAL LOW (ref 36.0–46.0)
Hemoglobin: 7.9 g/dL — ABNORMAL LOW (ref 12.0–15.0)
MCH: 27.1 pg (ref 26.0–34.0)
MCHC: 30.7 g/dL (ref 30.0–36.0)
MCV: 88 fL (ref 80.0–100.0)
Platelets: 417 10*3/uL — ABNORMAL HIGH (ref 150–400)
RBC: 2.92 MIL/uL — ABNORMAL LOW (ref 3.87–5.11)
RDW: 15.6 % — ABNORMAL HIGH (ref 11.5–15.5)
WBC: 6.9 10*3/uL (ref 4.0–10.5)
nRBC: 0 % (ref 0.0–0.2)

## 2022-05-24 LAB — GLUCOSE, CAPILLARY
Glucose-Capillary: 119 mg/dL — ABNORMAL HIGH (ref 70–99)
Glucose-Capillary: 138 mg/dL — ABNORMAL HIGH (ref 70–99)
Glucose-Capillary: 136 mg/dL — ABNORMAL HIGH (ref 70–99)
Glucose-Capillary: 117 mg/dL — ABNORMAL HIGH (ref 70–99)
Glucose-Capillary: 133 mg/dL — ABNORMAL HIGH (ref 70–99)

## 2022-05-24 LAB — MAGNESIUM: Magnesium: 2.4 mg/dL (ref 1.7–2.4)

## 2022-05-24 MED ORDER — ZINC SULFATE 220 (50 ZN) MG PO CAPS
220.0000 mg | ORAL_CAPSULE | Freq: Every day | ORAL | Status: DC
  Administered 2022-05-24 – 2022-06-06 (×12): 220 mg via ORAL
  Filled 2022-05-24 (×15): qty 1

## 2022-05-24 MED ORDER — PROSOURCE TF20 ENFIT COMPATIBL EN LIQD
60.0000 mL | Freq: Every day | ENTERAL | Status: DC
  Administered 2022-05-25 – 2022-06-12 (×15): 60 mL
  Filled 2022-05-24 (×17): qty 60

## 2022-05-24 MED ORDER — COPPER 2 MG PO TABS
2.0000 mg | Freq: Every day | Status: AC
  Administered 2022-05-24 – 2022-06-06 (×11): 2 mg
  Filled 2022-05-24 (×14): qty 1

## 2022-05-24 MED ORDER — PEPTAMEN 1.5 CAL PO LIQD
1000.0000 mL | ORAL | Status: DC
  Administered 2022-05-24 – 2022-06-04 (×4): 1000 mL
  Filled 2022-05-24 (×16): qty 1000

## 2022-05-24 NOTE — Progress Notes (Signed)
Nutrition Follow-up  DOCUMENTATION CODES:   Not applicable  INTERVENTION:   Tube Feeding via Cortrak:  Vital 1.5 at 55 ml/hr Pro-Source TF20 60 mL daily This provides 110 g of protein, 2060 kcals and 1003 mL of free water  Juven BID, each packet provides 80 calories, 8 grams of carbohydrate, 2.5  grams of protein (collagen), 7 grams of L-arginine and 7 grams of L-glutamine; supplement contains CaHMB, Vitamins C, E, B12 and Zinc to promote wound healing  Renal MVI daily  Recommend supplementing both copper and zinc. Add Zinc sulfate 220 mg daily and Copper 2 mg tablet daily per tube   NUTRITION DIAGNOSIS:   Inadequate oral intake related to inability to eat as evidenced by NPO status (SBO).  Being addressed via TF   GOAL:   Patient will meet greater than or equal to 90% of their needs  Progressing  MONITOR:   TF tolerance, Diet advancement, Labs, Weight trends, Skin  REASON FOR ASSESSMENT:   Consult Enteral/tube feeding initiation and management (trickle tube feeds)  ASSESSMENT:   Pt with hx of bipolar disorder, HTN, HLD, GERD, chronic bronchitis, GERD, DM type 2, and multiple prior abdominal surgeries presented to hospital for planned repair of a spigelian hernia.  Pt receiving iHD  Pt working with SLP, able to use PMV. Remains NPO. Pt wants to eat and states she is hungry. Denies abd pain or N/V.   Tolerating Peptamen 1.5 at 50 ml/hr via Cortrak. Receiving Pro-Source TF in addition to [place] upper lip wound which looks much improved  Micronutrient Labs: CRP: 1.9 (H) Ceruloplasmin: 10.8 (L) Copper: 40 (L) Vitamin B12: 1079 Vitamin B6: 6.4 Vitamin C: 0.5 (wdl) Zinc: 34 (L)  Labs: phosphorus 4.7, potassium 3.8 (wdl) Meds: rena-vite, ss novolog, novolog q 4 hours   Diet Order:   Diet Order             Diet NPO time specified  Diet effective midnight                   EDUCATION NEEDS:   Education needs have been addressed  Skin:   Skin Assessment:  (wound: buttocks) Skin Integrity Issues:: Stage I DTI: upper lip from ET tube Stage I: heel  Last BM:  10/25  Height:   Ht Readings from Last 1 Encounters:  05/22/22 '5\' 4"'$  (1.626 m)    Weight:   Wt Readings from Last 1 Encounters:  05/24/22 91.4 kg    Ideal Body Weight:  54.5 kg  BMI:  Body mass index is 34.59 kg/m.  Estimated Nutritional Needs:   Kcal:  1800-2000 kcal/d  Protein:  90-110 mL  Fluid:  1000 mL plus UOP   Kerman Passey MS, RDN, LDN, CNSC Registered Dietitian 3 Clinical Nutrition RD Pager and On-Call Pager Number Located in [place]

## 2022-05-24 NOTE — Progress Notes (Signed)
Speech Language Pathology Treatment: [name] Speaking valve  Patient Details Name: Patricia [name] MRN: [id number] DOB: 10-03-1970 Today's Date: 05/24/2022 Time: 1240-1306 SLP Time Calculation (min) (ACUTE ONLY): 26 min  Assessment / Plan / Recommendation Clinical Impression  Pt seen this afternoon for PMV tx with cuff deflated upon SLP arrival. Valve was placed so that Patricia [name] could talk to family members - one in person and one over the phone. She needed Mod cues to increase volume and slow her rate to become more intelligible even for in-person listeners. She wore her valve without overt signs of intolerance though. VS remained stable, there was no back pressure, and no perceived increase in WOB. Although still dysphonic, her voice sounded subjectively a little louder to this SLP today. Recommend increasing use of PMV throughout the day whenever supervision can be provided. Discussed with family and instructed them to ask for assistance in placing valve given presence of cuff.    HPI HPI: Patricia [name] is a 51 y.o. female who presented to hospital for excisional hernia repair (robotic hernia repair 04/23/22) with mesh, ileus post op on TNA. Developed vomiting and hypotension after NG came out on 04/29/22 and PNA on CXR, was tachycardic and BP improved with IV fluids. DX with sepsis and ABX started. Pt admitted to ICU on 10/2.  Was placed on levophed. Early AM on 10/3 was intubated for worsening metabolic/lactic acidosis. 10/4 cardiac cath with EF 15%; She had AKI as well. Started CRRT. Extubation 10/14 failed; underwent tracheostomy. TC trials started 10/16.      SLP Plan  Continue with current plan of care      Recommendations for follow up therapy are one component of a multi-disciplinary discharge planning process, led by the attending physician.  Recommendations may be updated based on patient status, additional functional criteria and insurance authorization.    Recommendations          Patient may use Passy-Muir Speech Valve: Intermittently with supervision;During all therapies with supervision PMSV Supervision: Full MD: Please consider changing trach tube to : Cuffless         Oral Care Recommendations: Oral care QID Follow Up Recommendations: Skilled nursing-short term rehab (<3 hours/day) Assistance recommended at discharge: None SLP Visit Diagnosis: Aphonia (R49.1) Plan: Continue with current plan of care           Osie Bond., M.A. [place] Office [phone]  Secure chat preferred   05/24/2022, 1:56 PM

## 2022-05-24 NOTE — Progress Notes (Signed)
Patient seen today by trach team for consult.  No education is needed at this time.  All necessary equipment is at beside.   Will continue to follow for progression.  

## 2022-05-24 NOTE — Progress Notes (Signed)
PROGRESS NOTE    Patricia [name]  GEZ:[id number] DOB: 05-28-1971 DOA: 04/23/2022 PCP: [name], MD   Brief Narrative:  51 year old with history of DM2, depression, migraine, asthma, schizophrenia, HTN, HLD presented to the hospital for exertional hernia, robotic hernia repair on 04/23/2022 with mesh who developed postop ileus and is on TNA.  Also patient developed vomiting and hypotension after NG tube came out on 04/29/2022 with concerns of pneumonia which was treated with antibiotics and IV fluids.  During that time patient also required Levophed and eventually intubated on 10/3 for worsening metabolic acidosis complicated by acute kidney injury.  Echocardiogram showed depressed EF of 30-35% with elevated troponin.  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  Patient was seen by CHF team started on dopamine and CRRT as well.  Patient was again reintubated due to neuromuscular weakness on 10/8.  Eventually extubated on 10/14 to BiPAP and trach was placed due to weakness.   Assessment & Plan:  Principal Problem:   Septic shock ([place]) Active Problems:   Hernia of abdominal cavity   Aspiration pneumonia (HCC)   Prolonged QT interval   Diabetes mellitus type 2 in obese (HCC)   BIPOLAR DISORDER UNSPECIFIED   Morbid obesity (HCC)   Chronic low back pain   Essential hypertension   Acute systolic heart failure (HCC)   Acute respiratory failure with hypoxia (HCC)   Pressure injury of skin     Assessment and Plan: * Septic shock ([place]) secondary to aspiration pneumonia, right lower lobe Acute respiratory failure with prolonged mechanical ventilation status post tracheostomy -Trach management by pulmonary team. Antibiotics- - Bronchodilators as needed, supportive care.  Out of bed to chair  Status post hernia repair complicated by postop ileus/inflammatory enteritis -Management by general surgery  Acute kidney injury -Initially required CRRT.  Nephrology  following now transition to HD  Biventricular failure, reduced EF - Last seen by CHF team 10/16.  Previously tried Bumex with minimal to no urine output.  At this point patient is on dialysis and has been well compensated.  Plans to follow-up outpatient  Anemia of chronic disease - From underlying multiple medical issues.  Transfuse as necessary and attempt to keep hemoglobin greater than 8  Essential hypertension -Currently on metoprolol twice daily  Chronic low back pain -As needed pain control  Morbid obesity (HCC) -Body mass index is 41.37 kg/m..  Currently on tube feeds -Follow-up outpatient, weight loss diet and exercise  BIPOLAR DISORDER UNSPECIFIED -On Klonopin.  Should slowly resume home meds as appropriate  Diabetes mellitus type 2 in obese (HCC) -Sliding scale and Accu-Cheks.  Pressure area on the lip in the setting of ET tube placement which was not present prior to admission.  Routine wound care.    DVT prophylaxis: Subcu heparin Code Status: Full code Family Communication:    Final dispo per general surgery  Nutritional status    Signs/Symptoms: NPO status (SBO)  Interventions: Refer to RD note for recommendations, MVI, Tube feeding  Body mass index is 34.59 kg/m.  Pressure Injury 05/22/22 Heel Right Stage 1 -  Intact skin with non-blanchable redness of a localized area usually over a bony prominence. (Active)  05/22/22 1157  Location: Heel  Location Orientation: Right  Staging: Stage 1 -  Intact skin with non-blanchable redness of a localized area usually over a bony prominence.  Wound Description (Comments):   Present on Admission: No        Subjective: No complaints, appears chronically ill.  Examination:  General exam: Appears calm and comfortable, trach in place Respiratory system: Some rhonchi heard anteriorly Cardiovascular system: S1 & S2 heard, RRR. No JVD, murmurs, rubs, gallops or clicks.  Trace lower extremity  edema Gastrointestinal system: Abdomen is nondistended, soft and nontender. No organomegaly or masses felt. Normal bowel sounds heard. Central nervous system: Difficult to assess orientation.  Grossly moving all the extremities Extremities: Symmetric 4 x 5 power. Skin: No rashes, lesions or ulcers Psychiatry: Poor judgment and insight Foley catheter in place  Objective: Vitals:   05/23/22 2300 05/24/22 0158 05/24/22 0348 05/24/22 0728  BP:    (!) 131/100  Pulse:  (!) 103  100  Resp:  (!) 22  19  Temp: 98.9 F (37.2 C)  97.9 F (36.6 C) 97.8 F (36.6 C)  TempSrc: Oral  Axillary Oral  SpO2:  100%  99%  Weight:   91.4 kg   Height:        Intake/Output Summary (Last 24 hours) at 05/24/2022 0736 Last data filed at 05/24/2022 0353 Gross per 24 hour  Intake --  Output 2600 ml  Net -2600 ml   Filed Weights   05/22/22 1845 05/22/22 2349 05/24/22 0348  Weight: 105.4 kg 111.2 kg 91.4 kg     Data Reviewed:   CBC: Recent Labs  Lab 05/18/22 0500 05/19/22 0345 05/22/22 0805 05/23/22 0027 05/24/22 0032  WBC 5.6 5.0 5.4 5.9 6.9  HGB 7.9* 8.0* 7.6* 7.8* 7.9*  HCT 23.7* 23.7* 23.3* 24.2* 25.7*  MCV 84.6 84.3 85.0 85.2 88.0  PLT 279 303 376 341 536*   Basic Metabolic Panel: Recent Labs  Lab 05/20/22 0637 05/20/22 1626 05/21/22 0656 05/22/22 0805 05/23/22 0027 05/24/22 0032  NA 130* 132* 133* 134* 135 137  K 6.2* 5.4* 4.8 5.2* 4.0 3.8  CL 92* 91* 93* 91* 97* 101  CO2 '22 23 27 26 27 27  '$ GLUCOSE 100* 119* 105* 131* 128* 141*  BUN 158* 171* 89* 122* 57* 82*  CREATININE 8.64* 9.00* 5.44* 7.37* 3.50* 3.80*  CALCIUM 8.4* 8.6* 8.1* 8.5* 8.5* 9.2  MG 2.6*  --  2.2 2.6* 2.0 2.4  PHOS 9.3* 9.7* 6.3* 7.2* 4.4 4.7*   GFR: Estimated Creatinine Clearance: 19 mL/min (A) (by C-G formula based on SCr of 3.8 mg/dL (H)). Liver Function Tests: Recent Labs  Lab 05/20/22 1626 05/21/22 0656 05/22/22 0805 05/23/22 0027 05/24/22 0032  ALBUMIN 1.8* 1.8* 1.7* 1.8* 1.9*   No  results for input(s): "LIPASE", "AMYLASE" in the last 168 hours. No results for input(s): "AMMONIA" in the last 168 hours. Coagulation Profile: No results for input(s): "INR", "PROTIME" in the last 168 hours. Cardiac Enzymes: No results for input(s): "CKTOTAL", "CKMB", "CKMBINDEX", "TROPONINI" in the last 168 hours. BNP (last 3 results) No results for input(s): "PROBNP" in the last 8760 hours. HbA1C: No results for input(s): "HGBA1C" in the last 72 hours. CBG: Recent Labs  Lab 05/23/22 1637 05/23/22 1931 05/23/22 2319 05/24/22 0345 05/24/22 0722  GLUCAP 104* 119* 112* 119* 138*   Lipid Profile: No results for input(s): "CHOL", "HDL", "LDLCALC", "TRIG", "CHOLHDL", "LDLDIRECT" in the last 72 hours. Thyroid Function Tests: No results for input(s): "TSH", "T4TOTAL", "FREET4", "T3FREE", "THYROIDAB" in the last 72 hours. Anemia Panel: No results for input(s): "VITAMINB12", "FOLATE", "FERRITIN", "TIBC", "IRON", "RETICCTPCT" in the last 72 hours. Sepsis Labs: No results for input(s): "PROCALCITON", "LATICACIDVEN" in the last 168 hours.  No results found for this or any previous visit (from the past 240 hour(s)).  Radiology Studies: DG Abd Portable 1V  Result Date: 05/22/2022 CLINICAL DATA:  Abdominal pain EXAM: PORTABLE ABDOMEN - 1 VIEW COMPARISON:  05/20/2022 FINDINGS: Feeding catheter is noted in the distal duodenum/proximal jejunum stable from the prior exam. No free air is seen. No obstructive changes are noted. No bony abnormality is seen. IMPRESSION: No acute abnormality noted. Electronically Signed   By: Inez Catalina M.D.   On: 05/22/2022 21:34        Scheduled Meds:  Chlorhexidine Gluconate Cloth  6 each Topical Q0600   clonazePAM  0.5 mg Per Tube QHS   darbepoetin (ARANESP) injection - DIALYSIS  100 mcg Subcutaneous Q Fri-1800   feeding supplement (PROSource TF20)  60 mL Per Tube BID   fiber  1 packet Per Tube BID   heparin injection (subcutaneous)  5,000  Units Subcutaneous Q8H   HYDROmorphone HCl  0.5 mg Oral Q4H   insulin aspart  0-20 Units Subcutaneous Q4H   insulin aspart  4 Units Subcutaneous Q4H   metoprolol tartrate  12.5 mg Per Tube BID   multivitamin  1 tablet Per Tube QHS   nutrition supplement (JUVEN)  1 packet Per Tube BID BM   mouth rinse  15 mL Mouth Rinse 4 times per day   pantoprazole  40 mg Per Tube Daily   QUEtiapine  50 mg Per Tube QHS   sodium chloride flush  10-40 mL Intracatheter Q12H   Continuous Infusions:  sodium chloride 10 mL/hr at 05/20/22 1900   albumin human     Peptamen 1.5 1,000 mL (05/23/22 1150)     LOS: 30 days   Time spent= 35 mins    Patricia Tarver Arsenio Loader, MD [facility]  If 7PM-7AM, please contact night-coverage  05/24/2022, 7:36 AM

## 2022-05-24 NOTE — Progress Notes (Signed)
Patient ID: Patricia [name], female   DOB: 08-26-1970, 51 y.o.   MRN: [id number] 23 Days Post-Op    Subjective: No major changes  Objective: Vital signs in last 24 hours: Temp:  [97.8 F (36.6 C)-98.9 F (37.2 C)] 97.8 F (36.6 C) (10/27 0728) Pulse Rate:  [22-110] 110 (10/27 1154) Resp:  [16-103] 24 (10/27 1154) BP: (131-148)/(93-100) 148/95 (10/27 1100) SpO2:  [96 %-100 %] 96 % (10/27 1154) FiO2 (%):  [28 %] 28 % (10/27 1154) Weight:  [91.4 kg] 91.4 kg (10/27 0348) Last BM Date : 05/22/22  Intake/Output from previous day: 10/26 0701 - 10/27 0700 In: -  Out: 2600 [Urine:2600] Intake/Output this shift: Total I/O In: 300 [NG/GT:300] Out: -   General appearance: cooperative Neck: trach GI: soft, NT  Lab Results: CBC  Recent Labs    05/23/22 0027 05/24/22 0032  WBC 5.9 6.9  HGB 7.8* 7.9*  HCT 24.2* 25.7*  PLT 341 417*    BMET Recent Labs    05/23/22 0027 05/24/22 0032  NA 135 137  K 4.0 3.8  CL 97* 101  CO2 27 27  GLUCOSE 128* 141*  BUN 57* 82*  CREATININE 3.50* 3.80*  CALCIUM 8.5* 9.2    PT/INR No results for input(s): "LABPROT", "INR" in the last 72 hours.  ABG No results for input(s): "PHART", "HCO3" in the last 72 hours.  Invalid input(s): "PCO2", "PO2"  Studies/Results: DG Abd Portable 1V  Result Date: 05/22/2022 CLINICAL DATA:  Abdominal pain EXAM: PORTABLE ABDOMEN - 1 VIEW COMPARISON:  05/20/2022 FINDINGS: Feeding catheter is noted in the distal duodenum/proximal jejunum stable from the prior exam. No free air is seen. No obstructive changes are noted. No bony abnormality is seen. IMPRESSION: No acute abnormality noted. Electronically Signed   By: Inez Catalina M.D.   On: 05/22/2022 21:34    Anti-infectives: Anti-infectives (From admission, onward)    Start     Dose/Rate Route Frequency Ordered Stop   05/15/22 1445  ceFAZolin (ANCEF) IVPB 2g/100 mL premix       Note to Pharmacy: To be given in IR   2 g 200 mL/hr over 30 Minutes  Intravenous  Once 05/15/22 1352 05/15/22 1616   05/15/22 1230  ceFAZolin (ANCEF) powder 1 g  Status:  Discontinued        1 g Other To Surgery 05/15/22 1140 05/15/22 1239   05/15/22 1230  ceFAZolin (ANCEF) IVPB 2g/100 mL premix        2 g 200 mL/hr over 30 Minutes Intravenous To Radiology 05/15/22 1140 05/15/22 1140   05/15/22 0000  ceFAZolin (ANCEF) IVPB 2g/100 mL premix        2 g 200 mL/hr over 30 Minutes Intravenous To Radiology 05/14/22 1308 05/15/22 0005   05/13/22 2134  meropenem (MERREM) 500 mg in sodium chloride 0.9 % 100 mL IVPB        500 mg 200 mL/hr over 30 Minutes Intravenous Every 24 hours 05/13/22 0815 05/17/22 0700   05/13/22 1000  meropenem (MERREM) 500 mg in sodium chloride 0.9 % 100 mL IVPB  Status:  Discontinued        500 mg 200 mL/hr over 30 Minutes Intravenous Every 12 hours 05/12/22 0940 05/12/22 0946   05/12/22 2200  meropenem (MERREM) 500 mg in sodium chloride 0.9 % 100 mL IVPB  Status:  Discontinued        500 mg 200 mL/hr over 30 Minutes Intravenous Every 12 hours 05/12/22 0946 05/13/22 0815   05/12/22  2000  meropenem (MERREM) 1 g in sodium chloride 0.9 % 100 mL IVPB  Status:  Discontinued        1 g 200 mL/hr over 30 Minutes Intravenous  Once 05/12/22 0940 05/12/22 0946   05/06/22 1530  meropenem (MERREM) 1 g in sodium chloride 0.9 % 100 mL IVPB  Status:  Discontinued        1 g 200 mL/hr over 30 Minutes Intravenous Every 8 hours 05/06/22 1438 05/12/22 0940   05/04/22 1000  vancomycin (VANCOCIN) IVPB 1000 mg/200 mL premix  Status:  Discontinued        1,000 mg 200 mL/hr over 60 Minutes Intravenous Every 24 hours 05/03/22 0802 05/08/22 0913   05/03/22 0845  vancomycin (VANCOREADY) IVPB 2000 mg/400 mL        2,000 mg 200 mL/hr over 120 Minutes Intravenous  Once 05/03/22 0751 05/03/22 1114   05/01/22 1800  piperacillin-tazobactam (ZOSYN) IVPB 3.375 g  Status:  Discontinued        3.375 g 12.5 mL/hr over 240 Minutes Intravenous Every 6 hours 05/01/22 1426  05/01/22 1427   05/01/22 1515  piperacillin-tazobactam (ZOSYN) IVPB 3.375 g  Status:  Discontinued        3.375 g 100 mL/hr over 30 Minutes Intravenous Every 6 hours 05/01/22 1428 05/06/22 1438   04/30/22 1400  piperacillin-tazobactam (ZOSYN) IVPB 2.25 g  Status:  Discontinued        2.25 g 100 mL/hr over 30 Minutes Intravenous Every 8 hours 04/30/22 1325 05/01/22 1426   04/29/22 1530  piperacillin-tazobactam (ZOSYN) IVPB 3.375 g  Status:  Discontinued        3.375 g 12.5 mL/hr over 240 Minutes Intravenous Every 8 hours 04/29/22 1433 04/30/22 1325   04/29/22 1145  metroNIDAZOLE (FLAGYL) IVPB 500 mg  Status:  Discontinued        500 mg 100 mL/hr over 60 Minutes Intravenous Every 12 hours 04/29/22 1047 04/29/22 1433   04/29/22 1145  vancomycin (VANCOCIN) IVPB 1000 mg/200 mL premix  Status:  Discontinued        1,000 mg 200 mL/hr over 60 Minutes Intravenous  Once 04/29/22 1047 04/29/22 1055   04/29/22 1145  vancomycin (VANCOREADY) IVPB 1500 mg/300 mL  Status:  Discontinued        1,500 mg 150 mL/hr over 120 Minutes Intravenous Every 24 hours 04/29/22 1055 04/29/22 1357   04/29/22 1045  ceFEPIme (MAXIPIME) 2 g in sodium chloride 0.9 % 100 mL IVPB  Status:  Discontinued        2 g 200 mL/hr over 30 Minutes Intravenous Every 8 hours 04/29/22 0954 04/29/22 1433   04/23/22 0730  ceFAZolin (ANCEF) IVPB 2g/100 mL premix        2 g 200 mL/hr over 30 Minutes Intravenous On call to O.R. 04/23/22 0726 04/23/22 0828       Assessment/Plan: Ms. Leisner is s/p robotic incisional hernia repair on 04/23/22 -Postop ileus complicated by aspiration: Ileus resolved, having bowel function.   - Inflammatory enteritis likely due to ischemic insult with dilated bowel at time of high pressor support - small bowel dilation on x-ray improved, having bowel movements, tube feeds. - Abdomen benign on exam - WBC normal - TF as tolerated, okay for speech eval for diet as tolerated   Acute metabolic encephalopathy  superimposed on hx bipolar disorder, schizophrenia   ICU delirium- improved now off all IV infusions. Acute biventricular HFrEF (Etiology sepsis, stress induced CM. Has clean coronaries) Cardiogenic shock resolved, EF improved.  Acute hypoxic respiratory failure Aspiration PNA Pulm edema   RLL VAP  AKI with oliguria; no baseline CKD   - some signs of renal recovery with UOP last few days with Foley Anemia, acute on chronic  DM2 with hyperglycemia  Severe protein calorie malnutrition  Pressure injury lip - improving with local care and trach [place] with multiple issues  Needs therapy and recouperation   Dispo - Remain in stepdown for trach care, case Management consult for placement - difficult with dialysis, vent needs, insurance limitations   LOS: 30 days    Felicie Morn, MD   05/24/2022

## 2022-05-24 NOTE — Progress Notes (Signed)
Physical Therapy Treatment Patient Details Name: Patricia [name] MRN: [id number] DOB: 27-Jun-1971 Today's Date: 05/24/2022   History of Present Illness 51 yo female admitted 04/23/22 for excisional hernia repair with mesh. Course complicated by post-op ileus, tachycardia, PNA, sepsis. Transfer to ICU 10/2 on levophed. Worsening acidosis 10/3 requiring intubation. Cardiac cath 10/4 with EF 15%. Pt with AKI; on CRRT 10/5-10/14. Failed extubation 10/8 and 10/14; s/p trach 10/14. PMH includes depression, OSA, asthma, schizophrenia, DM, insomnia.   PT Comments    Pt slowly progressing with mobility. Today's session focused on continue standing trials in stedy; pt requiring mod-maxA+2 for mobility, limited by c/o abdominal pain and RLE cramping with stand. PMSV donned during session, pt requires frequent reminders to use voice. Pt remains limited by generalized weakness, decreased activity tolerance, poor balance strategies/postural reactions and impaired cognition. Continue to recommend AIR-level therapies to maximize functional mobility and independence prior to return home, pending progression in activity tolerance.    Recommendations for follow up therapy are one component of a multi-disciplinary discharge planning process, led by the attending physician.  Recommendations may be updated based on patient status, additional functional criteria and insurance authorization.  Follow Up Recommendations  Acute inpatient rehab (3hours/day) Can patient physically be transported by private vehicle: No   Assistance Recommended at Discharge Frequent or constant Supervision/Assistance  Patient can return home with the following Two people to help with walking and/or transfers;Two people to help with bathing/dressing/bathroom;Assistance with cooking/housework;Assistance with feeding;Direct supervision/assist for medications management;Direct supervision/assist for financial management;Assist for  transportation;Help with stairs or ramp for entrance   Equipment Recommendations  Hospital bed;Wheelchair (measurements PT);Wheelchair cushion (measurements PT);Other (comment) (lift equipment)    Recommendations for Other Services       Precautions / Restrictions Precautions Precautions: Fall;Other (comment) Precaution Comments: trach collar, PMSV, cortrak, foley Restrictions Weight Bearing Restrictions: No     Mobility  Bed Mobility Overal bed mobility: Needs Assistance Bed Mobility: Supine to Sit     Supine to sit: Mod assist, +2 for physical assistance, HOB elevated     General bed mobility comments: significant assist for BLE management, modA for trunk elevation with HHA, modA+2 for scooting hips to EOB    Transfers Overall transfer level: Needs assistance Equipment used: Ambulation equipment used Transfers: Sit to/from Stand, Bed to chair/wheelchair/BSC Sit to Stand: Mod assist, +2 physical assistance           General transfer comment: modA+2 for trunk elevation with use of pad to assist hip extension, verbal/tactile cues to achieve fully upright posture but pt yelling out with RLE cramping requiring quick need to sit on stedy seat and limiting further stands beyond transfer to recliner. pt frequently reaching for BUE support on things other than bar requiring repeated cues for hand placement and sequencing Transfer via Lift Equipment: Stedy  Ambulation/Gait                   Stairs             Wheelchair Mobility    Modified Rankin (Stroke Patients Only)       Balance Overall balance assessment: Needs assistance Sitting-balance support: No upper extremity supported, Feet supported Sitting balance-Leahy Scale: Fair Sitting balance - Comments: can static stand without UE support, requires encouragement to let go of HHA   Standing balance support: Bilateral upper extremity supported Standing balance-Leahy Scale: Poor Standing balance  comment: reliant on BUE support in stedy frame and external assist  Cognition Arousal/Alertness: Awake/alert Behavior During Therapy: Flat affect Overall Cognitive Status: Difficult to assess Area of Impairment: Attention, Following commands, Safety/judgement                   Current Attention Level: Selective   Following Commands: Follows one step commands with increased time Safety/Judgement: Decreased awareness of safety, Decreased awareness of deficits     General Comments: session performed with PMSV donned, pt requires repeated cues to use voice as she reverts back to head nods often; sometimes speech difficult to understand. pt following majority of simple commands well. pt able to state which family members were visiting her earlier today, enjoys talking about grandkids/family. apparent confusion with blank stares at times        Exercises      General Comments General comments (skin integrity, edema, etc.): HR up to 120s, c/o RLE spasm and abdominal pain limiting activity progression this session. PMSV donned during session with good ability to hear voice, pt requires intermittent cues to speak instead of nodding/moaning      Pertinent Vitals/Pain Pain Assessment Pain Assessment: Faces Faces Pain Scale: Hurts even more Pain Location: abdomen, RLE Pain Descriptors / Indicators: Grimacing, Cramping, Moaning Pain Intervention(s): Monitored during session, Limited activity within patient's tolerance, Repositioned, Patient requesting pain meds-RN notified    Home Living                          Prior Function            PT Goals (current goals can now be found in the care plan section) Acute Rehab PT Goals Patient Stated Goal: "Get me out of bed" PT Goal Formulation: With patient Potential to Achieve Goals: Fair Progress towards PT goals: Progressing toward goals    Frequency    Min 3X/week      PT  Plan Current plan remains appropriate    Co-evaluation              AM-PAC PT "6 Clicks" Mobility   Outcome Measure  Help needed turning from your back to your side while in a flat bed without using bedrails?: A Lot Help needed moving from lying on your back to sitting on the side of a flat bed without using bedrails?: Total Help needed moving to and from a bed to a chair (including a wheelchair)?: Total Help needed standing up from a chair using your arms (e.g., wheelchair or bedside chair)?: Total Help needed to walk in hospital room?: Total Help needed climbing 3-5 steps with a railing? : Total 6 Click Score: 7    End of Session Equipment Utilized During Treatment: Oxygen;Gait belt (via trach collar) Activity Tolerance: Patient tolerated treatment well Patient left: in chair;with call bell/phone within reach Nurse Communication: Mobility status;Need for lift equipment PT Visit Diagnosis: Muscle weakness (generalized) (M62.81);Difficulty in walking, not elsewhere classified (R26.2);Other abnormalities of gait and mobility (R26.89);Unsteadiness on feet (R26.81)     Time: 0301-3143 PT Time Calculation (min) (ACUTE ONLY): 26 min  Charges:  $Therapeutic Activity: 23-37 mins                     Mabeline Caras, PT, DPT Acute Rehabilitation Services  Personal: [facility] Office: [place] 05/24/2022, 4:16 PM
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
## 2022-05-25 DIAGNOSIS — R6521 Severe sepsis with septic shock: Secondary | ICD-10-CM | POA: Diagnosis not present

## 2022-05-25 DIAGNOSIS — A419 Sepsis, unspecified organism: Secondary | ICD-10-CM | POA: Diagnosis not present

## 2022-05-25 LAB — RENAL FUNCTION PANEL
Albumin: 2 g/dL — ABNORMAL LOW (ref 3.5–5.0)
Anion gap: 7 (ref 5–15)
BUN: 98 mg/dL — ABNORMAL HIGH (ref 6–20)
CO2: 27 mmol/L (ref 22–32)
Calcium: 9.2 mg/dL (ref 8.9–10.3)
Chloride: 108 mmol/L (ref 98–111)
Creatinine, Ser: 3.57 mg/dL — ABNORMAL HIGH (ref 0.44–1.00)
GFR, Estimated: 15 mL/min — ABNORMAL LOW (ref >60)
Glucose, Bld: 120 mg/dL — ABNORMAL HIGH (ref 70–99)
Phosphorus: 4.8 mg/dL — ABNORMAL HIGH (ref 2.5–4.6)
Potassium: 4 mmol/L (ref 3.5–5.1)
Sodium: 142 mmol/L (ref 135–145)

## 2022-05-25 LAB — GLUCOSE, CAPILLARY
Glucose-Capillary: 115 mg/dL — ABNORMAL HIGH (ref 70–99)
Glucose-Capillary: 119 mg/dL — ABNORMAL HIGH (ref 70–99)
Glucose-Capillary: 123 mg/dL — ABNORMAL HIGH (ref 70–99)
Glucose-Capillary: 131 mg/dL — ABNORMAL HIGH (ref 70–99)
Glucose-Capillary: 138 mg/dL — ABNORMAL HIGH (ref 70–99)
Glucose-Capillary: 148 mg/dL — ABNORMAL HIGH (ref 70–99)
Glucose-Capillary: 150 mg/dL — ABNORMAL HIGH (ref 70–99)

## 2022-05-25 LAB — CBC
HCT: 24.6 % — ABNORMAL LOW (ref 36.0–46.0)
Hemoglobin: 7.6 g/dL — ABNORMAL LOW (ref 12.0–15.0)
MCH: 27.2 pg (ref 26.0–34.0)
MCHC: 30.9 g/dL (ref 30.0–36.0)
MCV: 88.2 fL (ref 80.0–100.0)
Platelets: 462 10*3/uL — ABNORMAL HIGH (ref 150–400)
RBC: 2.79 MIL/uL — ABNORMAL LOW (ref 3.87–5.11)
RDW: 15.9 % — ABNORMAL HIGH (ref 11.5–15.5)
WBC: 6.6 10*3/uL (ref 4.0–10.5)
nRBC: 0 % (ref 0.0–0.2)

## 2022-05-25 LAB — MAGNESIUM: Magnesium: 2.5 mg/dL — ABNORMAL HIGH (ref 1.7–2.4)

## 2022-05-25 MED ORDER — DARBEPOETIN ALFA 100 MCG/0.5ML IJ SOSY
100.0000 ug | PREFILLED_SYRINGE | INTRAMUSCULAR | Status: DC
  Administered 2022-05-25: 100 ug via SUBCUTANEOUS
  Filled 2022-05-25: qty 0.5

## 2022-05-25 MED ORDER — POLYETHYLENE GLYCOL 3350 17 G PO PACK
17.0000 g | PACK | Freq: Every day | ORAL | Status: DC
  Administered 2022-05-25: 17 g
  Filled 2022-05-25: qty 1

## 2022-05-25 MED ORDER — HYDROMORPHONE HCL 1 MG/ML IJ SOLN
0.5000 mg | INTRAMUSCULAR | Status: DC | PRN
  Administered 2022-05-25 – 2022-06-01 (×36): 0.5 mg via INTRAVENOUS
  Filled 2022-05-25 (×36): qty 0.5

## 2022-05-25 MED ORDER — METOPROLOL TARTRATE 25 MG PO TABS
25.0000 mg | ORAL_TABLET | Freq: Two times a day (BID) | ORAL | Status: DC
  Administered 2022-05-25 – 2022-05-26 (×4): 25 mg
  Filled 2022-05-25 (×4): qty 1
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
## 2022-05-25 NOTE — Progress Notes (Signed)
PROGRESS NOTE    Patricia [name]  TTS:[id number] DOB: September 28, 1970 DOA: 04/23/2022 PCP: [name], MD   Brief Narrative:  51 year old with history of DM2, depression, migraine, asthma, schizophrenia, HTN, HLD presented to the hospital for exertional hernia, robotic hernia repair on 04/23/2022 with mesh who developed postop ileus and is on TNA.  Also patient developed vomiting and hypotension after NG tube came out on 04/29/2022 with concerns of pneumonia which was treated with antibiotics and IV fluids.  During that time patient also required Levophed and eventually intubated on 10/3 for worsening metabolic acidosis complicated by acute kidney injury.  Echocardiogram showed depressed EF of 30-35% with elevated troponin.  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  Patient was seen by CHF team started on dopamine and CRRT as well.  Patient was again reintubated due to neuromuscular weakness on 10/8.  Eventually extubated on 10/14 to BiPAP and trach was placed due to weakness.   Assessment & Plan:  Principal Problem:   Septic shock ([place]) Active Problems:   Hernia of abdominal cavity   Aspiration pneumonia (HCC)   Prolonged QT interval   Diabetes mellitus type 2 in obese (HCC)   BIPOLAR DISORDER UNSPECIFIED   Morbid obesity (HCC)   Chronic low back pain   Essential hypertension   Acute systolic heart failure (HCC)   Acute respiratory failure with hypoxia (HCC)   Pressure injury of skin     Assessment and Plan: * Septic shock ([place]) secondary to aspiration pneumonia, right lower lobe Acute respiratory failure with prolonged mechanical ventilation status post tracheostomy -Trach management by pulmonary team. Antibiotics-none; last day was 10/19 - Bronchodilators as needed, supportive care.  Out of bed to chair  Status post hernia repair complicated by postop ileus/inflammatory enteritis -Management by general surgery.  At this time patient remains on tube  feeds, n.p.o. for moderate aspiration risk.  Trying PMV Has not had a bowel movement, will message surgery to help address  Acute kidney injury HD -Initially required CRRT.  Nephrology following now transition to HD  Biventricular failure, reduced EF - Last seen by CHF team 10/16.  Previously tried Bumex with minimal to no urine output.  At this point patient is on dialysis and has been well compensated.  Plans to follow-up outpatient  Sinus tachycardia - Increase metoprolol to 25 mg twice daily.  IV as needed  Anemia of chronic disease - From underlying multiple medical issues.  Transfuse as necessary and attempt to keep hemoglobin greater than 8  Essential hypertension -Currently on metoprolol twice daily  Chronic low back pain -As needed pain control  Morbid obesity (HCC) -Body mass index is 41.37 kg/m..  Currently on tube feeds -Follow-up outpatient, weight loss diet and exercise  BIPOLAR DISORDER UNSPECIFIED -On Klonopin.  Should slowly resume home meds as appropriate  Diabetes mellitus type 2 in obese (HCC) -Sliding scale and Accu-Cheks.  Pressure area on the lip in the setting of ET tube placement which was not present prior to admission.  Routine wound care.  PT-CIR      DVT prophylaxis: Subcu heparin Code Status: Full code Family Communication:    Final dispo per general surgery  Nutritional status    Signs/Symptoms: NPO status (SBO)  Interventions: Refer to RD note for recommendations, Tube feeding, MVI  Body mass index is 34.59 kg/m.  Pressure Injury 05/22/22 Heel Right Stage 1 -  Intact skin with non-blanchable redness of a localized area usually over a bony prominence. (Active)  05/22/22 1157  Location: Heel  Location Orientation: Right  Staging: Stage 1 -  Intact skin with non-blanchable redness of a localized area usually over a bony prominence.  Wound Description (Comments):   Present on Admission: No     Subjective: Laying in the  bed no acute events overnight.  Feels like she has not had a bowel movement in a few days therefore mild abdominal discomfort Examination:  Constitutional: Not in acute distress Respiratory: Clear to auscultation bilaterally Cardiovascular: Normal sinus rhythm, no rubs Abdomen: Nontender nondistended good bowel sounds Musculoskeletal: 1+ bilateral lower extremity pitting edema Skin: No rashes seen Neurologic: CN 2-12 grossly intact.  And nonfocal Psychiatric: Normal judgment and insight. Alert and oriented x 3. Normal mood. Foley catheter in place Tracheostomy in place Tube feeding in place  Objective: Vitals:   05/24/22 2000 05/25/22 0034 05/25/22 0300 05/25/22 0400  BP: (!) 150/98 132/87  127/88  Pulse:  (!) 109 (!) 101 (!) 106  Resp: 20 (!) '25 20 18  '$ Temp: 97.9 F (36.6 C) 98.6 F (37 C)  98.7 F (37.1 C)  TempSrc: Oral Oral  Oral  SpO2: 97% 97% 98% 100%  Weight:      Height:        Intake/Output Summary (Last 24 hours) at 05/25/2022 0719 Last data filed at 05/25/2022 0314 Gross per 24 hour  Intake 863.75 ml  Output 3525 ml  Net -2661.25 ml   Filed Weights   05/22/22 1845 05/22/22 2349 05/24/22 0348  Weight: 105.4 kg 111.2 kg 91.4 kg     Data Reviewed:   CBC: Recent Labs  Lab 05/19/22 0345 05/22/22 0805 05/23/22 0027 05/24/22 0032 05/25/22 0020  WBC 5.0 5.4 5.9 6.9 6.6  HGB 8.0* 7.6* 7.8* 7.9* 7.6*  HCT 23.7* 23.3* 24.2* 25.7* 24.6*  MCV 84.3 85.0 85.2 88.0 88.2  PLT 303 376 341 417* 322*   Basic Metabolic Panel: Recent Labs  Lab 05/21/22 0656 05/22/22 0805 05/23/22 0027 05/24/22 0032 05/25/22 0020  NA 133* 134* 135 137 142  K 4.8 5.2* 4.0 3.8 4.0  CL 93* 91* 97* 101 108  CO2 '27 26 27 27 27  '$ GLUCOSE 105* 131* 128* 141* 120*  BUN 89* 122* 57* 82* 98*  CREATININE 5.44* 7.37* 3.50* 3.80* 3.57*  CALCIUM 8.1* 8.5* 8.5* 9.2 9.2  MG 2.2 2.6* 2.0 2.4 2.5*  PHOS 6.3* 7.2* 4.4 4.7* 4.8*   GFR: Estimated Creatinine Clearance: 20.2 mL/min (A) (by  C-G formula based on SCr of 3.57 mg/dL (H)). Liver Function Tests: Recent Labs  Lab 05/21/22 0656 05/22/22 0805 05/23/22 0027 05/24/22 0032 05/25/22 0020  ALBUMIN 1.8* 1.7* 1.8* 1.9* 2.0*   No results for input(s): "LIPASE", "AMYLASE" in the last 168 hours. No results for input(s): "AMMONIA" in the last 168 hours. Coagulation Profile: No results for input(s): "INR", "PROTIME" in the last 168 hours. Cardiac Enzymes: No results for input(s): "CKTOTAL", "CKMB", "CKMBINDEX", "TROPONINI" in the last 168 hours. BNP (last 3 results) No results for input(s): "PROBNP" in the last 8760 hours. HbA1C: No results for input(s): "HGBA1C" in the last 72 hours. CBG: Recent Labs  Lab 05/24/22 1141 05/24/22 1641 05/24/22 2048 05/25/22 0044 05/25/22 0433  GLUCAP 136* 117* 133* 123* 115*   Lipid Profile: No results for input(s): "CHOL", "HDL", "LDLCALC", "TRIG", "CHOLHDL", "LDLDIRECT" in the last 72 hours. Thyroid Function Tests: No results for input(s): "TSH", "T4TOTAL", "FREET4", "T3FREE", "THYROIDAB" in the last 72 hours. Anemia Panel: No results for input(s): "VITAMINB12", "FOLATE", "FERRITIN", "TIBC", "  IRON", "RETICCTPCT" in the last 72 hours. Sepsis Labs: No results for input(s): "PROCALCITON", "LATICACIDVEN" in the last 168 hours.  No results found for this or any previous visit (from the past 240 hour(s)).       Radiology Studies: No results found.      Scheduled Meds:  Chlorhexidine Gluconate Cloth  6 each Topical Q0600   clonazePAM  0.5 mg Per Tube QHS   copper  2 mg Per Tube Daily   darbepoetin (ARANESP) injection - DIALYSIS  100 mcg Subcutaneous Q Fri-1800   feeding supplement (PROSource TF20)  60 mL Per Tube Daily   fiber  1 packet Per Tube BID   heparin injection (subcutaneous)  5,000 Units Subcutaneous Q8H   HYDROmorphone HCl  0.5 mg Oral Q4H   insulin aspart  0-20 Units Subcutaneous Q4H   insulin aspart  4 Units Subcutaneous Q4H   metoprolol tartrate  12.5  mg Per Tube BID   multivitamin  1 tablet Per Tube QHS   nutrition supplement (JUVEN)  1 packet Per Tube BID BM   mouth rinse  15 mL Mouth Rinse 4 times per day   pantoprazole  40 mg Per Tube Daily   QUEtiapine  50 mg Per Tube QHS   sodium chloride flush  10-40 mL Intracatheter Q12H   zinc sulfate  220 mg Oral Daily   Continuous Infusions:  sodium chloride 10 mL/hr at 05/20/22 1900   albumin human     feeding supplement (PEPTAMEN 1.5 CAL) 1,000 mL (05/24/22 1659)     LOS: 31 days   Time spent= 35 mins    Tamsyn Owusu Arsenio Loader, MD [facility]  If 7PM-7AM, please contact night-coverage  05/25/2022, 7:19 AM

## 2022-05-25 NOTE — Progress Notes (Signed)
Patient ID: Patricia [name], female   DOB: Apr 27, 1971, 51 y.o.   MRN: [id number] S: UOP 3.5L yesterday.  BUN up but creatinine down.  She c/o some abd discomfort but no uremic symptoms.    O:BP 127/88 (BP Location: Left Wrist)   Pulse (!) 102   Temp 98.9 F (37.2 C) (Oral)   Resp 18   Ht '5\' 4"'$  (1.626 m)   Wt 91.4 kg   LMP 07/07/2014 (Approximate)   SpO2 99%   BMI 34.59 kg/m   Intake/Output Summary (Last 24 hours) at 05/25/2022 1009 Last data filed at 05/25/2022 0314 Gross per 24 hour  Intake 563.75 ml  Output 3525 ml  Net -2961.25 ml    Intake/Output: I/O last 3 completed shifts: In: 863.8 [NG/GT:863.8] Out: 6734 [Urine:5475]  Intake/Output this shift:  No intake/output data recorded. Weight change:  Gen:on trach collar,   awake, sitting up looking comfortable CVS: RRR Resp:on TC, bilateral chest rise Abd: +BS, soft, NT Ext: Trace edema in the bilateral hands with legs Neuro: AO, participatory in conversation  GU: foley with clear yellow urine  Recent Labs  Lab 05/20/22 0637 05/20/22 1626 05/21/22 0656 05/22/22 0805 05/23/22 0027 05/24/22 0032 05/25/22 0020  NA 130* 132* 133* 134* 135 137 142  K 6.2* 5.4* 4.8 5.2* 4.0 3.8 4.0  CL 92* 91* 93* 91* 97* 101 108  CO2 '22 23 27 26 27 27 27  '$ GLUCOSE 100* 119* 105* 131* 128* 141* 120*  BUN 158* 171* 89* 122* 57* 82* 98*  CREATININE 8.64* 9.00* 5.44* 7.37* 3.50* 3.80* 3.57*  ALBUMIN 1.8* 1.8* 1.8* 1.7* 1.8* 1.9* 2.0*  CALCIUM 8.4* 8.6* 8.1* 8.5* 8.5* 9.2 9.2  PHOS 9.3* 9.7* 6.3* 7.2* 4.4 4.7* 4.8*    Liver Function Tests: Recent Labs  Lab 05/23/22 0027 05/24/22 0032 05/25/22 0020  ALBUMIN 1.8* 1.9* 2.0*    No results for input(s): "LIPASE", "AMYLASE" in the last 168 hours. No results for input(s): "AMMONIA" in the last 168 hours. CBC: Recent Labs  Lab 05/19/22 0345 05/22/22 0805 05/23/22 0027 05/24/22 0032 05/25/22 0020  WBC 5.0 5.4 5.9 6.9 6.6  HGB 8.0* 7.6* 7.8* 7.9* 7.6*  HCT 23.7* 23.3* 24.2*  25.7* 24.6*  MCV 84.3 85.0 85.2 88.0 88.2  PLT 303 376 341 417* 462*    Cardiac Enzymes: No results for input(s): "CKTOTAL", "CKMB", "CKMBINDEX", "TROPONINI" in the last 168 hours. CBG: Recent Labs  Lab 05/24/22 1641 05/24/22 2048 05/25/22 0044 05/25/22 0433 05/25/22 0826  GLUCAP 117* 133* 123* 115* 138*     Iron Studies:  No results for input(s): "IRON", "TIBC", "TRANSFERRIN", "FERRITIN" in the last 72 hours.  Studies/Results: No results found.  Chlorhexidine Gluconate Cloth  6 each Topical Q0600   clonazePAM  0.5 mg Per Tube QHS   copper  2 mg Per Tube Daily   darbepoetin (ARANESP) injection - DIALYSIS  100 mcg Subcutaneous Q Fri-1800   feeding supplement (PROSource TF20)  60 mL Per Tube Daily   fiber  1 packet Per Tube BID   heparin injection (subcutaneous)  5,000 Units Subcutaneous Q8H   insulin aspart  0-20 Units Subcutaneous Q4H   insulin aspart  4 Units Subcutaneous Q4H   metoprolol tartrate  25 mg Per Tube BID   multivitamin  1 tablet Per Tube QHS   nutrition supplement (JUVEN)  1 packet Per Tube BID BM   mouth rinse  15 mL Mouth Rinse 4 times per day   pantoprazole  40 mg Per Tube  Daily   QUEtiapine  50 mg Per Tube QHS   sodium chloride flush  10-40 mL Intracatheter Q12H   zinc sulfate  220 mg Oral Daily    BMET    Component Value Date/Time   NA 142 05/25/2022 0020   NA 139 07/02/2014 1923   K 4.0 05/25/2022 0020   K 3.9 07/02/2014 1923   CL 108 05/25/2022 0020   CL 105 07/02/2014 1923   CO2 27 05/25/2022 0020   CO2 26 07/02/2014 1923   GLUCOSE 120 (H) 05/25/2022 0020   GLUCOSE 95 07/02/2014 1923   BUN 98 (H) 05/25/2022 0020   BUN 7 07/02/2014 1923   CREATININE 3.57 (H) 05/25/2022 0020   CREATININE 0.98 07/02/2014 1923   CALCIUM 9.2 05/25/2022 0020   CALCIUM 8.5 07/02/2014 1923   GFRNONAA 15 (L) 05/25/2022 0020   GFRNONAA >60 07/02/2014 1923   GFRAA >60 04/22/2018 2215   GFRAA >60 07/02/2014 1923   CBC    Component Value Date/Time   WBC  6.6 05/25/2022 0020   RBC 2.79 (L) 05/25/2022 0020   HGB 7.6 (L) 05/25/2022 0020   HGB 9.4 (L) 07/02/2014 1923   HCT 24.6 (L) 05/25/2022 0020   HCT 30.3 (L) 07/02/2014 1923   PLT 462 (H) 05/25/2022 0020   PLT 449 (H) 07/02/2014 1923   MCV 88.2 05/25/2022 0020   MCV 71 (L) 07/02/2014 1923   MCH 27.2 05/25/2022 0020   MCHC 30.9 05/25/2022 0020   RDW 15.9 (H) 05/25/2022 0020   RDW 18.8 (H) 07/02/2014 1923   LYMPHSABS 2.0 05/07/2022 0428   MONOABS 2.4 (H) 05/07/2022 0428   EOSABS 0.0 05/07/2022 0428   BASOSABS 0.1 05/07/2022 0428   Assessment/Plan:   AKI - likely ATN in the setting of cardiogenic and septic shock. CRRT 10/4-10/14/23 via RIJ temp HD catheter. First IHD 10/16.  Looks to be in early phase of renal recovery - UOP really up over past 3-4 days.  Cr slightly improved today; BUN up a bit but Na ok. Looks like post ATN diuresis.   No indications for dialysis today - will hold and continue to monitor labs.  Avoid nephrotoxic medications including NSAIDs and iodinated intravenous contrast exposure unless the latter is absolutely indicated.  Preferred narcotic agents for pain control are hydromorphone, fentanyl, and methadone. Morphine should not be used. Avoid Baclofen and avoid oral sodium phosphate and magnesium citrate based laxatives / bowel preps. Continue strict Input and Output monitoring. Will monitor the patient closely with you and intervene or adjust therapy as indicated by changes in clinical status/labs  Cardiogenic/septic shock - off pressors now.  Heart failure team had been following. Likely due to aspiration pneumonia. Acute hypoxic respiratory failure - was re-intubated 05/05/22.  Extubated again on 10/14 but failed BiPAP, s/p trach and back on Vent 05/11/22 per PCCM > now tolerating TC 24h/d Anemia of critical illness - transfuse for Hgb <7. Requiring PRBCs. Per primary. ESA started 10/18 Acute MI - normal coronaries by cath 10/4 Hyperphosphatemia: Receiving  continuous feeds so binders of little efficacy. RD optimized feeds.  Phos normal today. S/p robotic incisional hernia repair with mesh and LOA - Surgery following.

## 2022-05-25 NOTE — Progress Notes (Signed)
Patient ID: Patricia [name], female   DOB: Sep 22, 1970, 51 y.o.   MRN: [id number] S: UOP 2.6L yesterday, no uremic symptoms - able to hold on dialysis and she's overjoyed.  O:BP (!) 139/93 (BP Location: Left Wrist)   Pulse 99   Temp 97.9 F (36.6 C)   Resp 18   Ht '5\' 4"'$  (1.626 m)   Wt 91.4 kg   LMP 07/07/2014 (Approximate)   SpO2 91%   BMI 34.59 kg/m   Intake/Output Summary (Last 24 hours) at 05/25/2022 1609 Last data filed at 05/25/2022 1543 Gross per 24 hour  Intake 563.75 ml  Output 3800 ml  Net -3236.25 ml    Intake/Output: I/O last 3 completed shifts: In: 863.8 [NG/GT:863.8] Out: 5475 [Urine:5475]  Intake/Output this shift:  Total I/O In: -  Out: 1400 [Urine:1300; Emesis/NG output:100] Weight change:  Gen:on trach collar,   awake, sitting up looking comfortable CVS: RRR Resp:on TC, bilateral chest rise Abd: +BS, soft, NT Ext: Trace edema in the bilateral hands with legs Neuro: AO, participatory in conversation  GU: foley with clear yellow urine  Recent Labs  Lab 05/20/22 0637 05/20/22 1626 05/21/22 0656 05/22/22 0805 05/23/22 0027 05/24/22 0032 05/25/22 0020  NA 130* 132* 133* 134* 135 137 142  K 6.2* 5.4* 4.8 5.2* 4.0 3.8 4.0  CL 92* 91* 93* 91* 97* 101 108  CO2 '22 23 27 26 27 27 27  '$ GLUCOSE 100* 119* 105* 131* 128* 141* 120*  BUN 158* 171* 89* 122* 57* 82* 98*  CREATININE 8.64* 9.00* 5.44* 7.37* 3.50* 3.80* 3.57*  ALBUMIN 1.8* 1.8* 1.8* 1.7* 1.8* 1.9* 2.0*  CALCIUM 8.4* 8.6* 8.1* 8.5* 8.5* 9.2 9.2  PHOS 9.3* 9.7* 6.3* 7.2* 4.4 4.7* 4.8*    Liver Function Tests: Recent Labs  Lab 05/23/22 0027 05/24/22 0032 05/25/22 0020  ALBUMIN 1.8* 1.9* 2.0*    No results for input(s): "LIPASE", "AMYLASE" in the last 168 hours. No results for input(s): "AMMONIA" in the last 168 hours. CBC: Recent Labs  Lab 05/19/22 0345 05/22/22 0805 05/23/22 0027 05/24/22 0032 05/25/22 0020  WBC 5.0 5.4 5.9 6.9 6.6  HGB 8.0* 7.6* 7.8* 7.9* 7.6*  HCT 23.7*  23.3* 24.2* 25.7* 24.6*  MCV 84.3 85.0 85.2 88.0 88.2  PLT 303 376 341 417* 462*    Cardiac Enzymes: No results for input(s): "CKTOTAL", "CKMB", "CKMBINDEX", "TROPONINI" in the last 168 hours. CBG: Recent Labs  Lab 05/24/22 2048 05/25/22 0044 05/25/22 0433 05/25/22 0826 05/25/22 1149  GLUCAP 133* 123* 115* 138* 148*     Iron Studies:  No results for input(s): "IRON", "TIBC", "TRANSFERRIN", "FERRITIN" in the last 72 hours.  Studies/Results: No results found.  Chlorhexidine Gluconate Cloth  6 each Topical Q0600   clonazePAM  0.5 mg Per Tube QHS   copper  2 mg Per Tube Daily   darbepoetin (ARANESP) injection - DIALYSIS  100 mcg Subcutaneous Q Sat-1800   feeding supplement (PROSource TF20)  60 mL Per Tube Daily   fiber  1 packet Per Tube BID   heparin injection (subcutaneous)  5,000 Units Subcutaneous Q8H   insulin aspart  0-20 Units Subcutaneous Q4H   insulin aspart  4 Units Subcutaneous Q4H   metoprolol tartrate  25 mg Per Tube BID   multivitamin  1 tablet Per Tube QHS   nutrition supplement (JUVEN)  1 packet Per Tube BID BM   mouth rinse  15 mL Mouth Rinse 4 times per day   pantoprazole  40 mg Per Tube Daily  polyethylene glycol  17 g Per Tube Daily   QUEtiapine  50 mg Per Tube QHS   sodium chloride flush  10-40 mL Intracatheter Q12H   zinc sulfate  220 mg Oral Daily    BMET    Component Value Date/Time   NA 142 05/25/2022 0020   NA 139 07/02/2014 1923   K 4.0 05/25/2022 0020   K 3.9 07/02/2014 1923   CL 108 05/25/2022 0020   CL 105 07/02/2014 1923   CO2 27 05/25/2022 0020   CO2 26 07/02/2014 1923   GLUCOSE 120 (H) 05/25/2022 0020   GLUCOSE 95 07/02/2014 1923   BUN 98 (H) 05/25/2022 0020   BUN 7 07/02/2014 1923   CREATININE 3.57 (H) 05/25/2022 0020   CREATININE 0.98 07/02/2014 1923   CALCIUM 9.2 05/25/2022 0020   CALCIUM 8.5 07/02/2014 1923   GFRNONAA 15 (L) 05/25/2022 0020   GFRNONAA >60 07/02/2014 1923   GFRAA >60 04/22/2018 2215   GFRAA >60  07/02/2014 1923   CBC    Component Value Date/Time   WBC 6.6 05/25/2022 0020   RBC 2.79 (L) 05/25/2022 0020   HGB 7.6 (L) 05/25/2022 0020   HGB 9.4 (L) 07/02/2014 1923   HCT 24.6 (L) 05/25/2022 0020   HCT 30.3 (L) 07/02/2014 1923   PLT 462 (H) 05/25/2022 0020   PLT 449 (H) 07/02/2014 1923   MCV 88.2 05/25/2022 0020   MCV 71 (L) 07/02/2014 1923   MCH 27.2 05/25/2022 0020   MCHC 30.9 05/25/2022 0020   RDW 15.9 (H) 05/25/2022 0020   RDW 18.8 (H) 07/02/2014 1923   LYMPHSABS 2.0 05/07/2022 0428   MONOABS 2.4 (H) 05/07/2022 0428   EOSABS 0.0 05/07/2022 0428   BASOSABS 0.1 05/07/2022 0428   Assessment/Plan:   AKI - likely ATN in the setting of cardiogenic and septic shock. CRRT 10/4-10/14/23 via RIJ temp HD catheter. First IHD 10/16.  Looks to be in early phase of renal recovery - UOP really up over past 3 days.  Cr stable; BUN up a bit but lytes ok. Looks like post ATN diuresis.   No indications for dialysis today - will hold and continue to monitor labs.  Avoid nephrotoxic medications including NSAIDs and iodinated intravenous contrast exposure unless the latter is absolutely indicated.  Preferred narcotic agents for pain control are hydromorphone, fentanyl, and methadone. Morphine should not be used. Avoid Baclofen and avoid oral sodium phosphate and magnesium citrate based laxatives / bowel preps. Continue strict Input and Output monitoring. Will monitor the patient closely with you and intervene or adjust therapy as indicated by changes in clinical status/labs  Cardiogenic/septic shock - off pressors now.  Heart failure team had been following. Likely due to aspiration pneumonia. Acute hypoxic respiratory failure - was re-intubated 05/05/22.  Extubated again on 10/14 but failed BiPAP, s/p trach and back on Vent 05/11/22 per PCCM > now tolerating TC 24h/d Anemia of critical illness - transfuse for Hgb <7. Requiring PRBCs. Per primary. ESA started 10/18 Acute MI - normal coronaries by  cath 10/4 Hyperphosphatemia: Receiving continuous feeds so binders of little efficacy. RD optimized feeds.  Phos normal today. S/p robotic incisional hernia repair with mesh and LOA - Surgery following.

## 2022-05-25 NOTE — Progress Notes (Signed)
RT note-Patient suctioned for moderate amount secretion, upon which she gagged and vomitted small amount of bile. RN notified, fio2 increased to 40% for recovery

## 2022-05-25 NOTE — Progress Notes (Signed)
Inpatient Rehab Admissions Coordinator:    I spoke to pt. Regarding potential CIR admit. She is interested and gave me consent to talk to her family regarding support. I will reach out to them and pursue for potential admit pending confirmation of support at discharge and medical readiness.  Clemens Catholic, [place], [facility] Admissions Coordinator  [phone] ([place]) [phone] (office)

## 2022-05-26 DIAGNOSIS — R6521 Severe sepsis with septic shock: Secondary | ICD-10-CM | POA: Diagnosis not present

## 2022-05-26 DIAGNOSIS — A419 Sepsis, unspecified organism: Secondary | ICD-10-CM | POA: Diagnosis not present

## 2022-05-26 LAB — RENAL FUNCTION PANEL
Albumin: 2 g/dL — ABNORMAL LOW (ref 3.5–5.0)
Anion gap: 8 (ref 5–15)
BUN: 103 mg/dL — ABNORMAL HIGH (ref 6–20)
CO2: 25 mmol/L (ref 22–32)
Calcium: 8.8 mg/dL — ABNORMAL LOW (ref 8.9–10.3)
Chloride: 110 mmol/L (ref 98–111)
Creatinine, Ser: 3.12 mg/dL — ABNORMAL HIGH (ref 0.44–1.00)
GFR, Estimated: 17 mL/min — ABNORMAL LOW (ref >60)
Glucose, Bld: 125 mg/dL — ABNORMAL HIGH (ref 70–99)
Phosphorus: 4.2 mg/dL (ref 2.5–4.6)
Potassium: 4 mmol/L (ref 3.5–5.1)
Sodium: 143 mmol/L (ref 135–145)

## 2022-05-26 LAB — GLUCOSE, CAPILLARY
Glucose-Capillary: 116 mg/dL — ABNORMAL HIGH (ref 70–99)
Glucose-Capillary: 125 mg/dL — ABNORMAL HIGH (ref 70–99)
Glucose-Capillary: 139 mg/dL — ABNORMAL HIGH (ref 70–99)
Glucose-Capillary: 161 mg/dL — ABNORMAL HIGH (ref 70–99)
Glucose-Capillary: 99 mg/dL (ref 70–99)

## 2022-05-26 LAB — MAGNESIUM: Magnesium: 2.5 mg/dL — ABNORMAL HIGH (ref 1.7–2.4)

## 2022-05-26 MED ORDER — POLYETHYLENE GLYCOL 3350 17 G PO PACK: 17.0000 g | PACK | Freq: Every day | ORAL | Status: DC | PRN

## 2022-05-26 MED ORDER — ACETAMINOPHEN 160 MG/5ML PO SOLN
650.0000 mg | ORAL | Status: DC | PRN
  Administered 2022-05-26 – 2022-06-03 (×6): 650 mg
  Filled 2022-05-26 (×6): qty 20.3

## 2022-05-26 NOTE — Plan of Care (Signed)
  Problem: Clinical Measurements: Goal: Ability to maintain clinical measurements within normal limits will improve Outcome: Progressing Goal: Will remain free from infection Outcome: Progressing Goal: Cardiovascular complication will be avoided Outcome: Progressing   Problem: Coping: Goal: Level of anxiety will decrease Outcome: Progressing   Problem: Elimination: Goal: Will not experience complications related to bowel motility Outcome: Progressing   Problem: Pain Managment: Goal: General experience of comfort will improve Outcome: Progressing   Problem: Safety: Goal: Ability to remain free from injury will improve Outcome: Progressing

## 2022-05-26 NOTE — Progress Notes (Signed)
25 Days Post-Op   Subjective/Chief Complaint: Multiple bowel movements yesterday after Miralax   Objective: Vital signs in last 24 hours: Temp:  [97.9 F (36.6 C)-99.1 F (37.3 C)] 99.1 F (37.3 C) (10/29 0352) Pulse Rate:  [87-110] 100 (10/29 0352) Resp:  [16-33] 19 (10/29 0352) BP: (131-147)/(90-101) 141/101 (10/29 0352) SpO2:  [91 %-100 %] 100 % (10/29 0352) FiO2 (%):  [28 %-40 %] 40 % (10/29 0352) Last BM Date : 05/25/22  Intake/Output from previous day: 10/28 0701 - 10/29 0700 In: -  Out: 2101 [Urine:2000; Emesis/NG output:100; Stool:1] Intake/Output this shift: No intake/output data recorded.  Trach in place Abd - soft, non-tender  Lab Results:  Recent Labs    05/24/22 0032 05/25/22 0020  WBC 6.9 6.6  HGB 7.9* 7.6*  HCT 25.7* 24.6*  PLT 417* 462*   BMET Recent Labs    05/25/22 0020 05/26/22 0026  NA 142 143  K 4.0 4.0  CL 108 110  CO2 27 25  GLUCOSE 120* 125*  BUN 98* 103*  CREATININE 3.57* 3.12*  CALCIUM 9.2 8.8*   PT/INR No results for input(s): "LABPROT", "INR" in the last 72 hours. ABG No results for input(s): "PHART", "HCO3" in the last 72 hours.  Invalid input(s): "PCO2", "PO2"  Studies/Results: No results found.  Anti-infectives: Anti-infectives (From admission, onward)    Start     Dose/Rate Route Frequency Ordered Stop   05/15/22 1445  ceFAZolin (ANCEF) IVPB 2g/100 mL premix       Note to Pharmacy: To be given in IR   2 g 200 mL/hr over 30 Minutes Intravenous  Once 05/15/22 1352 05/15/22 1616   05/15/22 1230  ceFAZolin (ANCEF) powder 1 g  Status:  Discontinued        1 g Other To Surgery 05/15/22 1140 05/15/22 1239   05/15/22 1230  ceFAZolin (ANCEF) IVPB 2g/100 mL premix        2 g 200 mL/hr over 30 Minutes Intravenous To Radiology 05/15/22 1140 05/15/22 1140   05/15/22 0000  ceFAZolin (ANCEF) IVPB 2g/100 mL premix        2 g 200 mL/hr over 30 Minutes Intravenous To Radiology 05/14/22 1308 05/15/22 0005   05/13/22 2134   meropenem (MERREM) 500 mg in sodium chloride 0.9 % 100 mL IVPB        500 mg 200 mL/hr over 30 Minutes Intravenous Every 24 hours 05/13/22 0815 05/17/22 0700   05/13/22 1000  meropenem (MERREM) 500 mg in sodium chloride 0.9 % 100 mL IVPB  Status:  Discontinued        500 mg 200 mL/hr over 30 Minutes Intravenous Every 12 hours 05/12/22 0940 05/12/22 0946   05/12/22 2200  meropenem (MERREM) 500 mg in sodium chloride 0.9 % 100 mL IVPB  Status:  Discontinued        500 mg 200 mL/hr over 30 Minutes Intravenous Every 12 hours 05/12/22 0946 05/13/22 0815   05/12/22 2000  meropenem (MERREM) 1 g in sodium chloride 0.9 % 100 mL IVPB  Status:  Discontinued        1 g 200 mL/hr over 30 Minutes Intravenous  Once 05/12/22 0940 05/12/22 0946   05/06/22 1530  meropenem (MERREM) 1 g in sodium chloride 0.9 % 100 mL IVPB  Status:  Discontinued        1 g 200 mL/hr over 30 Minutes Intravenous Every 8 hours 05/06/22 1438 05/12/22 0940   05/04/22 1000  vancomycin (VANCOCIN) IVPB 1000 mg/200 mL premix  Status:  Discontinued        1,000 mg 200 mL/hr over 60 Minutes Intravenous Every 24 hours 05/03/22 0802 05/08/22 0913   05/03/22 0845  vancomycin (VANCOREADY) IVPB 2000 mg/400 mL        2,000 mg 200 mL/hr over 120 Minutes Intravenous  Once 05/03/22 0751 05/03/22 1114   05/01/22 1800  piperacillin-tazobactam (ZOSYN) IVPB 3.375 g  Status:  Discontinued        3.375 g 12.5 mL/hr over 240 Minutes Intravenous Every 6 hours 05/01/22 1426 05/01/22 1427   05/01/22 1515  piperacillin-tazobactam (ZOSYN) IVPB 3.375 g  Status:  Discontinued        3.375 g 100 mL/hr over 30 Minutes Intravenous Every 6 hours 05/01/22 1428 05/06/22 1438   04/30/22 1400  piperacillin-tazobactam (ZOSYN) IVPB 2.25 g  Status:  Discontinued        2.25 g 100 mL/hr over 30 Minutes Intravenous Every 8 hours 04/30/22 1325 05/01/22 1426   04/29/22 1530  piperacillin-tazobactam (ZOSYN) IVPB 3.375 g  Status:  Discontinued        3.375 g 12.5 mL/hr  over 240 Minutes Intravenous Every 8 hours 04/29/22 1433 04/30/22 1325   04/29/22 1145  metroNIDAZOLE (FLAGYL) IVPB 500 mg  Status:  Discontinued        500 mg 100 mL/hr over 60 Minutes Intravenous Every 12 hours 04/29/22 1047 04/29/22 1433   04/29/22 1145  vancomycin (VANCOCIN) IVPB 1000 mg/200 mL premix  Status:  Discontinued        1,000 mg 200 mL/hr over 60 Minutes Intravenous  Once 04/29/22 1047 04/29/22 1055   04/29/22 1145  vancomycin (VANCOREADY) IVPB 1500 mg/300 mL  Status:  Discontinued        1,500 mg 150 mL/hr over 120 Minutes Intravenous Every 24 hours 04/29/22 1055 04/29/22 1357   04/29/22 1045  ceFEPIme (MAXIPIME) 2 g in sodium chloride 0.9 % 100 mL IVPB  Status:  Discontinued        2 g 200 mL/hr over 30 Minutes Intravenous Every 8 hours 04/29/22 0954 04/29/22 1433   04/23/22 0730  ceFAZolin (ANCEF) IVPB 2g/100 mL premix        2 g 200 mL/hr over 30 Minutes Intravenous On call to O.R. 04/23/22 0726 04/23/22 0828       Assessment/Plan: Ms. Patricia [name] is s/p robotic incisional hernia repair on 04/23/22 -Postop ileus complicated by aspiration: Ileus resolved, having bowel function.   - Inflammatory enteritis likely due to ischemic insult with dilated bowel at time of high pressor support - small bowel dilation on x-ray improved, having occasional bowel movements, but none in 3 days, tube feeds. - Abdomen benign on exam - WBC normal - TF as tolerated, okay for speech eval for diet as tolerated   Acute metabolic encephalopathy superimposed on hx bipolar disorder, schizophrenia   ICU delirium- improved now off all IV infusions. Acute biventricular HFrEF (Etiology sepsis, stress induced CM. Has clean coronaries) Cardiogenic shock resolved, EF improved.  Acute hypoxic respiratory failure Aspiration PNA Pulm edema   RLL VAP  AKI with oliguria; no baseline CKD   - some signs of renal recovery with UOP last few days with Foley Anemia, acute on chronic  DM2 with hyperglycemia   Severe protein calorie malnutrition  Pressure injury lip - improving with local care and trach [place] with multiple issues   Needs therapy and recouperation     Dispo - Remain in stepdown for trach care, case Management  consult for placement - difficult with dialysis, vent needs, insurance limitation  LOS: 32 days    Patricia [name] 05/26/2022

## 2022-05-26 NOTE — Progress Notes (Addendum)
Patient ID: Patricia [name], female   DOB: 1971/02/07, 51 y.o.   MRN: [id number] S: UOP 2L yesterday.  BUN up but creatinine down.  BMs yesterday after miralax. Denies new issues.   O:BP (!) 142/98 (BP Location: Left Wrist)   Pulse (!) 105   Temp 97.8 F (36.6 C) (Axillary)   Resp 18   Ht '5\' 4"'$  (1.626 m)   Wt 91.4 kg   LMP 07/07/2014 (Approximate)   SpO2 97%   BMI 34.59 kg/m   Intake/Output Summary (Last 24 hours) at 05/26/2022 0859 Last data filed at 05/25/2022 1920 Gross per 24 hour  Intake --  Output 2001 ml  Net -2001 ml    Intake/Output: I/O last 3 completed shifts: In: 563.8 [NG/GT:563.8] Out: 2701 [Urine:2600; Emesis/NG output:100; Stool:1]  Intake/Output this shift:  No intake/output data recorded. Weight change:  Gen:on trach collar,   awake, sitting up looking comfortable CVS: RRR Resp:on TC, bilateral chest rise Abd: +BS, soft, NT Ext: Trace edema in the bilateral hands with legs Neuro: AO, participatory in conversation  GU: foley with clear yellow urine  Recent Labs  Lab 05/20/22 1626 05/21/22 0656 05/22/22 0805 05/23/22 0027 05/24/22 0032 05/25/22 0020 05/26/22 0026  NA 132* 133* 134* 135 137 142 143  K 5.4* 4.8 5.2* 4.0 3.8 4.0 4.0  CL 91* 93* 91* 97* 101 108 110  CO2 '23 27 26 27 27 27 25  '$ GLUCOSE 119* 105* 131* 128* 141* 120* 125*  BUN 171* 89* 122* 57* 82* 98* 103*  CREATININE 9.00* 5.44* 7.37* 3.50* 3.80* 3.57* 3.12*  ALBUMIN 1.8* 1.8* 1.7* 1.8* 1.9* 2.0* 2.0*  CALCIUM 8.6* 8.1* 8.5* 8.5* 9.2 9.2 8.8*  PHOS 9.7* 6.3* 7.2* 4.4 4.7* 4.8* 4.2    Liver Function Tests: Recent Labs  Lab 05/24/22 0032 05/25/22 0020 05/26/22 0026  ALBUMIN 1.9* 2.0* 2.0*    No results for input(s): "LIPASE", "AMYLASE" in the last 168 hours. No results for input(s): "AMMONIA" in the last 168 hours. CBC: Recent Labs  Lab 05/22/22 0805 05/23/22 0027 05/24/22 0032 05/25/22 0020  WBC 5.4 5.9 6.9 6.6  HGB 7.6* 7.8* 7.9* 7.6*  HCT 23.3* 24.2* 25.7*  24.6*  MCV 85.0 85.2 88.0 88.2  PLT 376 341 417* 462*    Cardiac Enzymes: No results for input(s): "CKTOTAL", "CKMB", "CKMBINDEX", "TROPONINI" in the last 168 hours. CBG: Recent Labs  Lab 05/25/22 1149 05/25/22 1921 05/25/22 2037 05/25/22 2323 05/26/22 0455  GLUCAP 148* 150* 131* 119* 139*     Iron Studies:  No results for input(s): "IRON", "TIBC", "TRANSFERRIN", "FERRITIN" in the last 72 hours.  Studies/Results: No results found.  Chlorhexidine Gluconate Cloth  6 each Topical Q0600   clonazePAM  0.5 mg Per Tube QHS   copper  2 mg Per Tube Daily   darbepoetin (ARANESP) injection - DIALYSIS  100 mcg Subcutaneous Q Sat-1800   feeding supplement (PROSource TF20)  60 mL Per Tube Daily   fiber  1 packet Per Tube BID   heparin injection (subcutaneous)  5,000 Units Subcutaneous Q8H   insulin aspart  0-20 Units Subcutaneous Q4H   insulin aspart  4 Units Subcutaneous Q4H   metoprolol tartrate  25 mg Per Tube BID   multivitamin  1 tablet Per Tube QHS   nutrition supplement (JUVEN)  1 packet Per Tube BID BM   mouth rinse  15 mL Mouth Rinse 4 times per day   pantoprazole  40 mg Per Tube Daily   QUEtiapine  50 mg  Per Tube QHS   sodium chloride flush  10-40 mL Intracatheter Q12H   zinc sulfate  220 mg Oral Daily    BMET    Component Value Date/Time   NA 143 05/26/2022 0026   NA 139 07/02/2014 1923   K 4.0 05/26/2022 0026   K 3.9 07/02/2014 1923   CL 110 05/26/2022 0026   CL 105 07/02/2014 1923   CO2 25 05/26/2022 0026   CO2 26 07/02/2014 1923   GLUCOSE 125 (H) 05/26/2022 0026   GLUCOSE 95 07/02/2014 1923   BUN 103 (H) 05/26/2022 0026   BUN 7 07/02/2014 1923   CREATININE 3.12 (H) 05/26/2022 0026   CREATININE 0.98 07/02/2014 1923   CALCIUM 8.8 (L) 05/26/2022 0026   CALCIUM 8.5 07/02/2014 1923   GFRNONAA 17 (L) 05/26/2022 0026   GFRNONAA >60 07/02/2014 1923   GFRAA >60 04/22/2018 2215   GFRAA >60 07/02/2014 1923   CBC    Component Value Date/Time   WBC 6.6  05/25/2022 0020   RBC 2.79 (L) 05/25/2022 0020   HGB 7.6 (L) 05/25/2022 0020   HGB 9.4 (L) 07/02/2014 1923   HCT 24.6 (L) 05/25/2022 0020   HCT 30.3 (L) 07/02/2014 1923   PLT 462 (H) 05/25/2022 0020   PLT 449 (H) 07/02/2014 1923   MCV 88.2 05/25/2022 0020   MCV 71 (L) 07/02/2014 1923   MCH 27.2 05/25/2022 0020   MCHC 30.9 05/25/2022 0020   RDW 15.9 (H) 05/25/2022 0020   RDW 18.8 (H) 07/02/2014 1923   LYMPHSABS 2.0 05/07/2022 0428   MONOABS 2.4 (H) 05/07/2022 0428   EOSABS 0.0 05/07/2022 0428   BASOSABS 0.1 05/07/2022 0428   Assessment/Plan:   AKI - likely ATN in the setting of cardiogenic and septic shock. CRRT 10/4-10/14/23 via RIJ temp HD catheter. First IHD 10/16.  Looks to be in early phase of renal recovery - UOP really up over past 4-5 days.  Cr further  improved today; BUN up a bit but Na ok. Looks like post ATN diuresis + catabolic state.   No indications for dialysis today - will hold and continue to monitor labs.  Likely will be able remove HD catheter in the coming days if continued improvement - it's a TDC placed by IR 10/18 Maintain foley for now - was having some retention - when more mobile/up do TOV Avoid nephrotoxic medications including NSAIDs and iodinated intravenous contrast exposure unless the latter is absolutely indicated.  Preferred narcotic agents for pain control are hydromorphone, fentanyl, and methadone. Morphine should not be used. Avoid Baclofen and avoid oral sodium phosphate and magnesium citrate based laxatives / bowel preps. Continue strict Input and Output monitoring. Will monitor the patient closely with you and intervene or adjust therapy as indicated by changes in clinical status/labs  Cardiogenic/septic shock - off pressors now.  Heart failure team had been following. Likely due to aspiration pneumonia. Acute hypoxic respiratory failure - was re-intubated 05/05/22.  Extubated again on 10/14 but failed BiPAP, s/p trach and back on Vent 05/11/22 per  PCCM > now tolerating TC 24h/d - pulm following Anemia of critical illness - transfuse for Hgb <7. Requiring PRBCs. Per primary. Acute MI - normal coronaries by cath 10/4 Hyperphosphatemia: Receiving continuous feeds so binders of little efficacy. RD optimized feeds.  Phos normalized. S/p robotic incisional hernia repair with mesh and LOA - Surgery following.  Bipolar/schizophrenia Dispo:  inpatient for multiple issues still -- if she's able to remain dialysis independent that will really help.

## 2022-05-26 NOTE — Progress Notes (Signed)
PROGRESS NOTE    Patricia [name]  UJW:[id number] DOB: Jan 31, 1971 DOA: 04/23/2022 PCP: [name], MD   Brief Narrative:  51 year old with history of DM2, depression, migraine, asthma, schizophrenia, HTN, HLD presented to the hospital for exertional hernia, robotic hernia repair on 04/23/2022 with mesh who developed postop ileus and is on TNA.  Also patient developed vomiting and hypotension after NG tube came out on 04/29/2022 with concerns of pneumonia which was treated with antibiotics and IV fluids.  During that time patient also required Levophed and eventually intubated on 10/3 for worsening metabolic acidosis complicated by acute kidney injury.  Echocardiogram showed depressed EF of 30-35% with elevated troponin.  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  Patient was seen by CHF team started on dopamine and CRRT as well.  Patient was again reintubated due to neuromuscular weakness on 10/8.  Eventually extubated on 10/14 to BiPAP and trach was placed due to weakness.   Assessment & Plan:  Principal Problem:   Septic shock ([place]) Active Problems:   Hernia of abdominal cavity   Aspiration pneumonia (HCC)   Prolonged QT interval   Diabetes mellitus type 2 in obese (HCC)   BIPOLAR DISORDER UNSPECIFIED   Morbid obesity (HCC)   Chronic low back pain   Essential hypertension   Acute systolic heart failure (HCC)   Acute respiratory failure with hypoxia (HCC)   Pressure injury of skin     Assessment and Plan: * Septic shock ([place]) secondary to aspiration pneumonia, right lower lobe Acute respiratory failure with prolonged mechanical ventilation status post tracheostomy -Trach management by pulmonary team. Antibiotics-none; last day was 10/19 - Bronchodilators as needed, supportive care.  Out of bed to chair  Status post hernia repair complicated by postop ileus/inflammatory enteritis -Management by general surgery.  At this time patient remains on tube  feeds, n.p.o. for moderate aspiration risk.  [place] trial Added Miralax for constipation.   Acute kidney injury HD -Initially required CRRT. [facility] HD per Nephro  Biventricular failure, reduced EF - Last seen by CHF team 10/16.  Previously tried Bumex with minimal to no urine output.  At this point patient is on dialysis and has been well compensated.  Plans to follow-up outpatient. LHC shows clean coronaries.   Sinus tachycardia; improved.  -Metoprolol to 25 mg twice daily.  IV as needed  Anemia of chronic disease - From underlying multiple medical issues.  Transfuse as necessary and attempt to keep hemoglobin greater than 8  Essential hypertension -Currently on metoprolol twice daily  Chronic low back pain -As needed pain control  Morbid obesity (HCC) -Body mass index is 41.37 kg/m..  Currently on tube feeds -Follow-up outpatient, weight loss diet and exercise  BIPOLAR DISORDER UNSPECIFIED -On Klonopin.  Should slowly resume home meds as appropriate  Diabetes mellitus type 2 in obese (HCC) -Sliding scale and Accu-Cheks.  Pressure area on the lip in the setting of ET tube placement which was not present prior to admission.  Routine wound care.  PT-CIR      DVT prophylaxis: Subcu heparin Code Status: Full code Family Communication:    Final dispo per general surgery  Nutritional status    Signs/Symptoms: NPO status (SBO)  Interventions: Refer to RD note for recommendations, Tube feeding, MVI  Body mass index is 34.59 kg/m.  Pressure Injury 05/22/22 Heel Right Stage 1 -  Intact skin with non-blanchable redness of a localized area usually over a bony prominence. (Active)  05/22/22 1157  Location: Heel  Location Orientation: Right  Staging: Stage 1 -  Intact skin with non-blanchable redness of a localized area usually over a bony prominence.  Wound Description (Comments):   Present on Admission: No     Subjective:  Still has some abdominal pain, about 3-4  bowel movements since yesterday evening  Examination: Constitutional: Not in acute distress Respiratory: Clear to auscultation bilaterally Cardiovascular: Normal sinus rhythm, no rubs Abdomen: Nontender nondistended good bowel sounds Musculoskeletal: 1+ bilateral lower extremity pitting edema Skin: No rashes seen Neurologic: CN 2-12 grossly intact.  And nonfocal Psychiatric: Normal judgment and insight. Alert and oriented x 3. Normal mood.  Foley catheter in place Tracheostomy in place Tube feeding in place  Objective: Vitals:   05/25/22 1623 05/25/22 1925 05/25/22 2325 05/26/22 0352  BP:  (!) 147/97 (!) 136/90 (!) 141/101  Pulse: (!) 108 100 100 100  Resp: (!) 33 '20 16 19  '$ Temp:  98.2 F (36.8 C) 98.9 F (37.2 C) 99.1 F (37.3 C)  TempSrc:  Oral Oral Axillary  SpO2: 92% 96% 100% 100%  Weight:      Height:        Intake/Output Summary (Last 24 hours) at 05/26/2022 0721 Last data filed at 05/25/2022 1920 Gross per 24 hour  Intake --  Output 2101 ml  Net -2101 ml   Filed Weights   05/22/22 1845 05/22/22 2349 05/24/22 0348  Weight: 105.4 kg 111.2 kg 91.4 kg     Data Reviewed:   CBC: Recent Labs  Lab 05/22/22 0805 05/23/22 0027 05/24/22 0032 05/25/22 0020  WBC 5.4 5.9 6.9 6.6  HGB 7.6* 7.8* 7.9* 7.6*  HCT 23.3* 24.2* 25.7* 24.6*  MCV 85.0 85.2 88.0 88.2  PLT 376 341 417* 035*   Basic Metabolic Panel: Recent Labs  Lab 05/22/22 0805 05/23/22 0027 05/24/22 0032 05/25/22 0020 05/26/22 0026  NA 134* 135 137 142 143  K 5.2* 4.0 3.8 4.0 4.0  CL 91* 97* 101 108 110  CO2 '26 27 27 27 25  '$ GLUCOSE 131* 128* 141* 120* 125*  BUN 122* 57* 82* 98* 103*  CREATININE 7.37* 3.50* 3.80* 3.57* 3.12*  CALCIUM 8.5* 8.5* 9.2 9.2 8.8*  MG 2.6* 2.0 2.4 2.5* 2.5*  PHOS 7.2* 4.4 4.7* 4.8* 4.2   GFR: Estimated Creatinine Clearance: 23.1 mL/min (A) (by C-G formula based on SCr of 3.12 mg/dL (H)). Liver Function Tests: Recent Labs  Lab 05/22/22 0805 05/23/22 0027  05/24/22 0032 05/25/22 0020 05/26/22 0026  ALBUMIN 1.7* 1.8* 1.9* 2.0* 2.0*   No results for input(s): "LIPASE", "AMYLASE" in the last 168 hours. No results for input(s): "AMMONIA" in the last 168 hours. Coagulation Profile: No results for input(s): "INR", "PROTIME" in the last 168 hours. Cardiac Enzymes: No results for input(s): "CKTOTAL", "CKMB", "CKMBINDEX", "TROPONINI" in the last 168 hours. BNP (last 3 results) No results for input(s): "PROBNP" in the last 8760 hours. HbA1C: No results for input(s): "HGBA1C" in the last 72 hours. CBG: Recent Labs  Lab 05/25/22 1149 05/25/22 1921 05/25/22 2037 05/25/22 2323 05/26/22 0455  GLUCAP 148* 150* 131* 119* 139*   Lipid Profile: No results for input(s): "CHOL", "HDL", "LDLCALC", "TRIG", "CHOLHDL", "LDLDIRECT" in the last 72 hours. Thyroid Function Tests: No results for input(s): "TSH", "T4TOTAL", "FREET4", "T3FREE", "THYROIDAB" in the last 72 hours. Anemia Panel: No results for input(s): "VITAMINB12", "FOLATE", "FERRITIN", "TIBC", "IRON", "RETICCTPCT" in the last 72 hours. Sepsis Labs: No results for input(s): "PROCALCITON", "LATICACIDVEN" in the last 168 hours.  No results found for this  or any previous visit (from the past 240 hour(s)).       Radiology Studies: No results found.      Scheduled Meds:  Chlorhexidine Gluconate Cloth  6 each Topical Q0600   clonazePAM  0.5 mg Per Tube QHS   copper  2 mg Per Tube Daily   darbepoetin (ARANESP) injection - DIALYSIS  100 mcg Subcutaneous Q Sat-1800   feeding supplement (PROSource TF20)  60 mL Per Tube Daily   fiber  1 packet Per Tube BID   heparin injection (subcutaneous)  5,000 Units Subcutaneous Q8H   insulin aspart  0-20 Units Subcutaneous Q4H   insulin aspart  4 Units Subcutaneous Q4H   metoprolol tartrate  25 mg Per Tube BID   multivitamin  1 tablet Per Tube QHS   nutrition supplement (JUVEN)  1 packet Per Tube BID BM   mouth rinse  15 mL Mouth Rinse 4 times per  day   pantoprazole  40 mg Per Tube Daily   polyethylene glycol  17 g Per Tube Daily   QUEtiapine  50 mg Per Tube QHS   sodium chloride flush  10-40 mL Intracatheter Q12H   zinc sulfate  220 mg Oral Daily   Continuous Infusions:  sodium chloride 10 mL/hr at 05/20/22 1900   albumin human     feeding supplement (PEPTAMEN 1.5 CAL) 1,000 mL (05/25/22 1144)     LOS: 32 days   Time spent= 35 mins    Patricia Marchiano Arsenio Loader, MD [facility]  If 7PM-7AM, please contact night-coverage  05/26/2022, 7:21 AM

## 2022-05-27 ENCOUNTER — Inpatient Hospital Stay (HOSPITAL_COMMUNITY): Payer: Medicaid Other

## 2022-05-27 ENCOUNTER — Ambulatory Visit: Payer: Medicaid Other | Admitting: Neurology

## 2022-05-27 DIAGNOSIS — Z930 Tracheostomy status: Secondary | ICD-10-CM | POA: Diagnosis not present

## 2022-05-27 DIAGNOSIS — J690 Pneumonitis due to inhalation of food and vomit: Secondary | ICD-10-CM | POA: Diagnosis not present

## 2022-05-27 DIAGNOSIS — R6521 Severe sepsis with septic shock: Secondary | ICD-10-CM | POA: Diagnosis not present

## 2022-05-27 DIAGNOSIS — A419 Sepsis, unspecified organism: Secondary | ICD-10-CM | POA: Diagnosis not present

## 2022-05-27 LAB — RENAL FUNCTION PANEL
Albumin: 1.9 g/dL — ABNORMAL LOW (ref 3.5–5.0)
Albumin: 2.2 g/dL — ABNORMAL LOW (ref 3.5–5.0)
Anion gap: 7 (ref 5–15)
Anion gap: 8 (ref 5–15)
BUN: 92 mg/dL — ABNORMAL HIGH (ref 6–20)
BUN: 93 mg/dL — ABNORMAL HIGH (ref 6–20)
CO2: 24 mmol/L (ref 22–32)
CO2: 28 mmol/L (ref 22–32)
Calcium: 9 mg/dL (ref 8.9–10.3)
Calcium: 9.1 mg/dL (ref 8.9–10.3)
Chloride: 116 mmol/L — ABNORMAL HIGH (ref 98–111)
Chloride: 118 mmol/L — ABNORMAL HIGH (ref 98–111)
Creatinine, Ser: 2.55 mg/dL — ABNORMAL HIGH (ref 0.44–1.00)
Creatinine, Ser: 2.65 mg/dL — ABNORMAL HIGH (ref 0.44–1.00)
GFR, Estimated: 21 mL/min — ABNORMAL LOW (ref >60)
GFR, Estimated: 22 mL/min — ABNORMAL LOW (ref >60)
Glucose, Bld: 137 mg/dL — ABNORMAL HIGH (ref 70–99)
Glucose, Bld: 221 mg/dL — ABNORMAL HIGH (ref 70–99)
Phosphorus: 3.7 mg/dL (ref 2.5–4.6)
Phosphorus: 4.1 mg/dL (ref 2.5–4.6)
Potassium: 4 mmol/L (ref 3.5–5.1)
Potassium: 4.2 mmol/L (ref 3.5–5.1)
Sodium: 150 mmol/L — ABNORMAL HIGH (ref 135–145)
Sodium: 151 mmol/L — ABNORMAL HIGH (ref 135–145)

## 2022-05-27 LAB — GLUCOSE, CAPILLARY
Glucose-Capillary: 121 mg/dL — ABNORMAL HIGH (ref 70–99)
Glucose-Capillary: 117 mg/dL — ABNORMAL HIGH (ref 70–99)
Glucose-Capillary: 209 mg/dL — ABNORMAL HIGH (ref 70–99)
Glucose-Capillary: 122 mg/dL — ABNORMAL HIGH (ref 70–99)
Glucose-Capillary: 96 mg/dL (ref 70–99)

## 2022-05-27 LAB — MAGNESIUM: Magnesium: 2.6 mg/dL — ABNORMAL HIGH (ref 1.7–2.4)

## 2022-05-27 MED ORDER — METOPROLOL TARTRATE 50 MG PO TABS
50.0000 mg | ORAL_TABLET | Freq: Two times a day (BID) | ORAL | Status: DC
  Administered 2022-05-27 – 2022-06-13 (×33): 50 mg
  Filled 2022-05-27 (×34): qty 1

## 2022-05-27 MED ORDER — POLYETHYLENE GLYCOL 3350 17 G PO PACK
17.0000 g | PACK | Freq: Every day | ORAL | Status: DC
  Administered 2022-05-30 – 2022-06-04 (×3): 17 g
  Filled 2022-05-27 (×6): qty 1

## 2022-05-27 MED ORDER — DEXTROSE 5 % IV SOLN: INTRAVENOUS | Status: DC

## 2022-05-27 MED ORDER — FREE WATER
200.0000 mL | Status: DC
  Administered 2022-05-27 – 2022-05-28 (×2): 200 mL

## 2022-05-27 NOTE — Progress Notes (Signed)
26 Days Post-Op   Subjective/Chief Complaint: Nausea and vomiting today.  Bms over the weekend.   Objective: Vital signs in last 24 hours: Temp:  [98.3 F (36.8 C)-99.4 F (37.4 C)] 98.5 F (36.9 C) (10/30 1108) Pulse Rate:  [75-109] 100 (10/30 1230) Resp:  [20-40] 38 (10/30 1230) BP: (134-148)/(91-110) 148/103 (10/30 1108) SpO2:  [91 %-100 %] 95 % (10/30 1230) FiO2 (%):  [28 %] 28 % (10/30 1230) Last BM Date : 05/26/22  Intake/Output from previous day: 10/29 0701 - 10/30 0700 In: 1436.3 [NG/GT:1436.3] Out: 1725 [Urine:1725] Intake/Output this shift: No intake/output data recorded.  Trach in place Abd - soft, non-tender  Lab Results:  Recent Labs    05/25/22 0020  WBC 6.6  HGB 7.6*  HCT 24.6*  PLT 462*    BMET Recent Labs    05/27/22 0019 05/27/22 1227  NA 151* 150*  K 4.0 4.2  CL 116* 118*  CO2 28 24  GLUCOSE 137* 221*  BUN 93* 92*  CREATININE 2.65* 2.55*  CALCIUM 9.0 9.1    PT/INR No results for input(s): "LABPROT", "INR" in the last 72 hours. ABG No results for input(s): "PHART", "HCO3" in the last 72 hours.  Invalid input(s): "PCO2", "PO2"  Studies/Results: DG Abd Portable 1V  Result Date: 05/27/2022 CLINICAL DATA:  Nausea EXAM: PORTABLE ABDOMEN - 1 VIEW COMPARISON:  05/22/2022 FINDINGS: Limited exam secondary to poor penetration related to patient body habitus. Feeding tube is again seen with distal tip projecting near the duodenojejunal junction. Single mildly dilated loop of small bowel in the left hemiabdomen. No gross free intraperitoneal air on supine imaging. IMPRESSION: Limited exam. Single mildly dilated loop of small bowel in the left hemiabdomen, nonspecific and could reflect a focal ileus or enteritis. Developing obstruction not excluded. Electronically Signed   By: Davina Poke D.O.   On: 05/27/2022 14:32    Anti-infectives: Anti-infectives (From admission, onward)    Start     Dose/Rate Route Frequency Ordered Stop    05/15/22 1445  ceFAZolin (ANCEF) IVPB 2g/100 mL premix       Note to Pharmacy: To be given in IR   2 g 200 mL/hr over 30 Minutes Intravenous  Once 05/15/22 1352 05/15/22 1616   05/15/22 1230  ceFAZolin (ANCEF) powder 1 g  Status:  Discontinued        1 g Other To Surgery 05/15/22 1140 05/15/22 1239   05/15/22 1230  ceFAZolin (ANCEF) IVPB 2g/100 mL premix        2 g 200 mL/hr over 30 Minutes Intravenous To Radiology 05/15/22 1140 05/15/22 1140   05/15/22 0000  ceFAZolin (ANCEF) IVPB 2g/100 mL premix        2 g 200 mL/hr over 30 Minutes Intravenous To Radiology 05/14/22 1308 05/15/22 0005   05/13/22 2134  meropenem (MERREM) 500 mg in sodium chloride 0.9 % 100 mL IVPB        500 mg 200 mL/hr over 30 Minutes Intravenous Every 24 hours 05/13/22 0815 05/17/22 0700   05/13/22 1000  meropenem (MERREM) 500 mg in sodium chloride 0.9 % 100 mL IVPB  Status:  Discontinued        500 mg 200 mL/hr over 30 Minutes Intravenous Every 12 hours 05/12/22 0940 05/12/22 0946   05/12/22 2200  meropenem (MERREM) 500 mg in sodium chloride 0.9 % 100 mL IVPB  Status:  Discontinued        500 mg 200 mL/hr over 30 Minutes Intravenous Every 12 hours 05/12/22 0946  05/13/22 0815   05/12/22 2000  meropenem (MERREM) 1 g in sodium chloride 0.9 % 100 mL IVPB  Status:  Discontinued        1 g 200 mL/hr over 30 Minutes Intravenous  Once 05/12/22 0940 05/12/22 0946   05/06/22 1530  meropenem (MERREM) 1 g in sodium chloride 0.9 % 100 mL IVPB  Status:  Discontinued        1 g 200 mL/hr over 30 Minutes Intravenous Every 8 hours 05/06/22 1438 05/12/22 0940   05/04/22 1000  vancomycin (VANCOCIN) IVPB 1000 mg/200 mL premix  Status:  Discontinued        1,000 mg 200 mL/hr over 60 Minutes Intravenous Every 24 hours 05/03/22 0802 05/08/22 0913   05/03/22 0845  vancomycin (VANCOREADY) IVPB 2000 mg/400 mL        2,000 mg 200 mL/hr over 120 Minutes Intravenous  Once 05/03/22 0751 05/03/22 1114   05/01/22 1800  piperacillin-tazobactam  (ZOSYN) IVPB 3.375 g  Status:  Discontinued        3.375 g 12.5 mL/hr over 240 Minutes Intravenous Every 6 hours 05/01/22 1426 05/01/22 1427   05/01/22 1515  piperacillin-tazobactam (ZOSYN) IVPB 3.375 g  Status:  Discontinued        3.375 g 100 mL/hr over 30 Minutes Intravenous Every 6 hours 05/01/22 1428 05/06/22 1438   04/30/22 1400  piperacillin-tazobactam (ZOSYN) IVPB 2.25 g  Status:  Discontinued        2.25 g 100 mL/hr over 30 Minutes Intravenous Every 8 hours 04/30/22 1325 05/01/22 1426   04/29/22 1530  piperacillin-tazobactam (ZOSYN) IVPB 3.375 g  Status:  Discontinued        3.375 g 12.5 mL/hr over 240 Minutes Intravenous Every 8 hours 04/29/22 1433 04/30/22 1325   04/29/22 1145  metroNIDAZOLE (FLAGYL) IVPB 500 mg  Status:  Discontinued        500 mg 100 mL/hr over 60 Minutes Intravenous Every 12 hours 04/29/22 1047 04/29/22 1433   04/29/22 1145  vancomycin (VANCOCIN) IVPB 1000 mg/200 mL premix  Status:  Discontinued        1,000 mg 200 mL/hr over 60 Minutes Intravenous  Once 04/29/22 1047 04/29/22 1055   04/29/22 1145  vancomycin (VANCOREADY) IVPB 1500 mg/300 mL  Status:  Discontinued        1,500 mg 150 mL/hr over 120 Minutes Intravenous Every 24 hours 04/29/22 1055 04/29/22 1357   04/29/22 1045  ceFEPIme (MAXIPIME) 2 g in sodium chloride 0.9 % 100 mL IVPB  Status:  Discontinued        2 g 200 mL/hr over 30 Minutes Intravenous Every 8 hours 04/29/22 0954 04/29/22 1433   04/23/22 0730  ceFAZolin (ANCEF) IVPB 2g/100 mL premix        2 g 200 mL/hr over 30 Minutes Intravenous On call to O.R. 04/23/22 0726 04/23/22 [id number]       Assessment/Plan: Patricia [name] is s/p robotic incisional hernia repair on 04/23/22 -Postop ileus complicated by aspiration: Ileus resolved, having bowel function, though unreliable.  Will schedule miralax daily as it seems she may be getting backed up - Inflammatory enteritis likely due to ischemic insult with dilated bowel at time of high pressor support -  small bowel dilation on x-ray improved - Abdomen benign on exam - recheck WBC tomorrow - TF as tolerated, okay for speech eval for diet as tolerated   Acute metabolic encephalopathy superimposed on hx bipolar disorder, schizophrenia   ICU delirium- improved now off all IV infusions. Acute  biventricular HFrEF (Etiology sepsis, stress induced CM. Has clean coronaries) Cardiogenic shock resolved, EF improved.  Acute hypoxic respiratory failure Aspiration PNA Pulm edema   RLL VAP  AKI with oliguria; no baseline CKD   - some signs of renal recovery with UOP last few days with Foley Anemia, acute on chronic  DM2 with hyperglycemia  Severe protein calorie malnutrition  Pressure injury lip - improving with local care and trach [place] with multiple issues   Needs therapy and recouperation     Dispo - Remain in stepdown for trach care, case Management consult for placement - difficult with dialysis, vent needs, insurance limitation  LOS: 33 days    Patricia [name] 05/27/2022

## 2022-05-27 NOTE — Plan of Care (Signed)
  Problem: Clinical Measurements: Goal: Ability to maintain clinical measurements within normal limits will improve Outcome: Progressing Goal: Will remain free from infection Outcome: Progressing Goal: Cardiovascular complication will be avoided Outcome: Progressing   Problem: Elimination: Goal: Will not experience complications related to bowel motility Outcome: Progressing   Problem: Pain Managment: Goal: General experience of comfort will improve Outcome: Progressing   Problem: Safety: Goal: Ability to remain free from injury will improve Outcome: Progressing

## 2022-05-27 NOTE — Progress Notes (Signed)
Physical Therapy Treatment Patient Details Name: Patricia [name] MRN: [id number] DOB: 1970-08-25 Today's Date: 05/27/2022   History of Present Illness 51 yo female admitted 04/23/22 for excisional hernia repair with mesh. Course complicated by post-op ileus, tachycardia, PNA, sepsis. Transfer to ICU 10/2 on levophed. Worsening acidosis 10/3 requiring intubation. Cardiac cath 10/4 with EF 15%. Pt with AKI; on CRRT 10/5-10/14. Failed extubation 10/8 and 10/14; s/p trach 10/14. PMH includes depression, OSA, asthma, schizophrenia, DM, insomnia.    PT Comments    Pt with minimal progression of mobility during today's session. Today's session focused on scooting in chair and attempt of sit to stand transfer. Sit to stand total+2 with RW and stedy without success. Pt agreeable to participate, but still noted decreased participation this session, suspect related to pain and fatigue, but pt not forthcoming with info when asked; RN reports pt sad/crying earlier. Pt remains limited by generalized weakness, decreased activity tolerance, and impaired balance strategies/postural reactions. Continue to recommend acute PT services to maximize functional mobility and independence prior to d/c to AIR level therapies pending activity tolerance.  Pre mobility BP 141/103 - RN aware    Recommendations for follow up therapy are one component of a multi-disciplinary discharge planning process, led by the attending physician.  Recommendations may be updated based on patient status, additional functional criteria and insurance authorization.  Follow Up Recommendations  Acute inpatient rehab (3hours/day) Can patient physically be transported by private vehicle: No   Assistance Recommended at Discharge Frequent or constant Supervision/Assistance  Patient can return home with the following Two people to help with walking and/or transfers;Two people to help with bathing/dressing/bathroom;Assistance with  cooking/housework;Assistance with feeding;Direct supervision/assist for medications management;Direct supervision/assist for financial management;Assist for transportation;Help with stairs or ramp for entrance   Equipment Recommendations  Hospital bed;Wheelchair (measurements PT);Wheelchair cushion (measurements PT);Other (comment) (lift equipment)    Recommendations for Other Services       Precautions / Restrictions Precautions Precautions: Fall;Other (comment) Precaution Comments: trach collar, PMSV, cortrak, foley Restrictions Weight Bearing Restrictions: No     Mobility  Bed Mobility               General bed mobility comments: Pt sitting up in recliner upon arrival. Did demonstrate scooting back in chair minA, scooting fwd in chair modA    Transfers Overall transfer level: Needs assistance Equipment used: Rolling walker (2 wheels), Ambulation equipment used Transfers: Sit to/from Stand Sit to Stand: +2 physical assistance, Total assist           General transfer comment: Trialed standing from recliner with RW and stedy +2 with unsuccessful attempts. pt put forth minimal effort with assisting during transfers, allowing UEs to leave support surface or stedy surface that was being pulled on/pushed against. Transfer via Lift Equipment: Stedy  Ambulation/Gait               General Gait Details: unable   Stairs             Wheelchair Mobility    Modified Rankin (Stroke Patients Only)       Balance Overall balance assessment: Needs assistance Sitting-balance support: Feet supported, Feet unsupported, Single extremity supported Sitting balance-Leahy Scale: Fair Sitting balance - Comments: Pt able to demonstrate static sitting in recliner min guard, did experience one LOB forward and was unable to self correct, require PT assist back to upright position       Standing balance comment: trials of standing with assistive equipment not successful  today  Cognition Arousal/Alertness: Awake/alert Behavior During Therapy: Flat affect Overall Cognitive Status: Difficult to assess Area of Impairment: Attention, Following commands, Safety/judgement, Problem solving                   Current Attention Level: Sustained   Following Commands: Follows one step commands with increased time Safety/Judgement: Decreased awareness of safety, Decreased awareness of deficits   Problem Solving: Requires tactile cues, Requires verbal cues, Slow processing General Comments: Cognition difficult to assess as today pt seemed more irritated and selective towards what she chose to participate in. Passy muir in place throughout session but pt used it seldom        Exercises General Exercises - Lower Extremity Long Arc Quad: PROM, Both, 10 reps Hip Flexion/Marching: PROM, Both, 10 reps    General Comments General comments (skin integrity, edema, etc.): Pt less engaged in therapy session today. BP before mobility 141/103.      Pertinent Vitals/Pain Pain Assessment Pain Assessment: Faces Faces Pain Scale: Hurts even more Pain Location: R leg Pain Descriptors / Indicators: Grimacing, Aching, Guarding    Home Living                          Prior Function            PT Goals (current goals can now be found in the care plan section) Acute Rehab PT Goals Patient Stated Goal: "Get me out of bed" PT Goal Formulation: With patient Time For Goal Achievement: 06/06/22 Potential to Achieve Goals: Fair Progress towards PT goals: Progressing toward goals    Frequency    Min 3X/week      PT Plan Current plan remains appropriate    Co-evaluation              AM-PAC PT "6 Clicks" Mobility   Outcome Measure  Help needed turning from your back to your side while in a flat bed without using bedrails?: A Lot Help needed moving from lying on your back to sitting on the side of a flat  bed without using bedrails?: Total Help needed moving to and from a bed to a chair (including a wheelchair)?: Total Help needed standing up from a chair using your arms (e.g., wheelchair or bedside chair)?: Total Help needed to walk in hospital room?: Total Help needed climbing 3-5 steps with a railing? : Total 6 Click Score: 7    End of Session Equipment Utilized During Treatment: Gait belt Activity Tolerance: Patient limited by pain;Other (comment) (Pt with less participation today) Patient left: in chair;with call bell/phone within reach;with nursing/sitter in room Nurse Communication: Mobility status;Other (comment) (Vitals) PT Visit Diagnosis: Muscle weakness (generalized) (M62.81);Difficulty in walking, not elsewhere classified (R26.2);Other abnormalities of gait and mobility (R26.89);Unsteadiness on feet (R26.81)     Time: 9767-3419 PT Time Calculation (min) (ACUTE ONLY): 33 min  Charges:  $Therapeutic Activity: 23-37 mins                     Chipper Oman, SPT    [place] Anquinette Pierro 05/27/2022, 12:43 PM

## 2022-05-27 NOTE — Progress Notes (Signed)
Patient ID: Patricia [name], female   DOB: 02-Oct-1970, 51 y.o.   MRN: [id number] S:  Sitting in the chair today- asking for something to drink- unfortunately can't take PO  O:BP (!) 138/96 (BP Location: Left Wrist)   Pulse (!) 109   Temp 99.4 F (37.4 C) (Oral)   Resp (!) 24   Ht '5\' 4"'$  (1.626 m)   Wt 91.4 kg   LMP 07/07/2014 (Approximate)   SpO2 98%   BMI 34.59 kg/m   Intake/Output Summary (Last 24 hours) at 05/27/2022 0944 Last data filed at 05/27/2022 0648 Gross per 24 hour  Intake 1436.25 ml  Output 1725 ml  Net -288.75 ml   Intake/Output: I/O last 3 completed shifts: In: 1436.3 [NG/GT:1436.3] Out: 2425 [Urine:2425]  Intake/Output this shift:  No intake/output data recorded. Weight change:   Gen:on trach collar, sitting in the chair CVS: RRR Resp:on TC, clear anteriorly Abd: +BS, soft, NT Ext: Trace edema in the bilateral hands with legs Neuro: AAO, able to converse by blinking and mouthing words GU: foley with clear yellow urine  Recent Labs  Lab 05/21/22 0656 05/22/22 0805 05/23/22 0027 05/24/22 0032 05/25/22 0020 05/26/22 0026 05/27/22 0019  NA 133* 134* 135 137 142 143 151*  K 4.8 5.2* 4.0 3.8 4.0 4.0 4.0  CL 93* 91* 97* 101 108 110 116*  CO2 '27 26 27 27 27 25 28  '$ GLUCOSE 105* 131* 128* 141* 120* 125* 137*  BUN 89* 122* 57* 82* 98* 103* 93*  CREATININE 5.44* 7.37* 3.50* 3.80* 3.57* 3.12* 2.65*  ALBUMIN 1.8* 1.7* 1.8* 1.9* 2.0* 2.0* 1.9*  CALCIUM 8.1* 8.5* 8.5* 9.2 9.2 8.8* 9.0  PHOS 6.3* 7.2* 4.4 4.7* 4.8* 4.2 4.1   Liver Function Tests: Recent Labs  Lab 05/25/22 0020 05/26/22 0026 05/27/22 0019  ALBUMIN 2.0* 2.0* 1.9*   No results for input(s): "LIPASE", "AMYLASE" in the last 168 hours. No results for input(s): "AMMONIA" in the last 168 hours. CBC: Recent Labs  Lab 05/22/22 0805 05/23/22 0027 05/24/22 0032 05/25/22 0020  WBC 5.4 5.9 6.9 6.6  HGB 7.6* 7.8* 7.9* 7.6*  HCT 23.3* 24.2* 25.7* 24.6*  MCV 85.0 85.2 88.0 88.2  PLT 376  341 417* 462*   Cardiac Enzymes: No results for input(s): "CKTOTAL", "CKMB", "CKMBINDEX", "TROPONINI" in the last 168 hours. CBG: Recent Labs  Lab 05/26/22 1747 05/26/22 1943 05/26/22 2340 05/27/22 0330 05/27/22 0742  GLUCAP 99 116* 125* 121* 117*    Iron Studies:  No results for input(s): "IRON", "TIBC", "TRANSFERRIN", "FERRITIN" in the last 72 hours.  Studies/Results: No results found.  Chlorhexidine Gluconate Cloth  6 each Topical Q0600   clonazePAM  0.5 mg Per Tube QHS   copper  2 mg Per Tube Daily   feeding supplement (PROSource TF20)  60 mL Per Tube Daily   fiber  1 packet Per Tube BID   heparin injection (subcutaneous)  5,000 Units Subcutaneous Q8H   insulin aspart  0-20 Units Subcutaneous Q4H   insulin aspart  4 Units Subcutaneous Q4H   metoprolol tartrate  50 mg Per Tube BID   multivitamin  1 tablet Per Tube QHS   nutrition supplement (JUVEN)  1 packet Per Tube BID BM   mouth rinse  15 mL Mouth Rinse 4 times per day   pantoprazole  40 mg Per Tube Daily   QUEtiapine  50 mg Per Tube QHS   sodium chloride flush  10-40 mL Intracatheter Q12H   zinc sulfate  220 mg Oral  Daily    BMET    Component Value Date/Time   NA 151 (H) 05/27/2022 0019   NA 139 07/02/2014 1923   K 4.0 05/27/2022 0019   K 3.9 07/02/2014 1923   CL 116 (H) 05/27/2022 0019   CL 105 07/02/2014 1923   CO2 28 05/27/2022 0019   CO2 26 07/02/2014 1923   GLUCOSE 137 (H) 05/27/2022 0019   GLUCOSE 95 07/02/2014 1923   BUN 93 (H) 05/27/2022 0019   BUN 7 07/02/2014 1923   CREATININE 2.65 (H) 05/27/2022 0019   CREATININE 0.98 07/02/2014 1923   CALCIUM 9.0 05/27/2022 0019   CALCIUM 8.5 07/02/2014 1923   GFRNONAA 21 (L) 05/27/2022 0019   GFRNONAA >60 07/02/2014 1923   GFRAA >60 04/22/2018 2215   GFRAA >60 07/02/2014 1923   CBC    Component Value Date/Time   WBC 6.6 05/25/2022 0020   RBC 2.79 (L) 05/25/2022 0020   HGB 7.6 (L) 05/25/2022 0020   HGB 9.4 (L) 07/02/2014 1923   HCT 24.6 (L)  05/25/2022 0020   HCT 30.3 (L) 07/02/2014 1923   PLT 462 (H) 05/25/2022 0020   PLT 449 (H) 07/02/2014 1923   MCV 88.2 05/25/2022 0020   MCV 71 (L) 07/02/2014 1923   MCH 27.2 05/25/2022 0020   MCHC 30.9 05/25/2022 0020   RDW 15.9 (H) 05/25/2022 0020   RDW 18.8 (H) 07/02/2014 1923   LYMPHSABS 2.0 05/07/2022 0428   MONOABS 2.4 (H) 05/07/2022 0428   EOSABS 0.0 05/07/2022 0428   BASOSABS 0.1 05/07/2022 0428   Assessment/Plan:   AKI - likely ATN in the setting of cardiogenic and septic shock. CRRT 10/4-10/14/23 via RIJ temp HD catheter. First IHD 10/16.  Looks to be in early phase of renal recovery - UOP really up over past 4-5 days.  Cr further  improved today; BUN coming down- Na higher, ? Accurate, rpeat. Looks like post ATN diuresis + catabolic state.   No indications for dialysis today - will hold and continue to monitor labs.  Likely will be able remove HD catheter in the coming days if continued improvement - it's a TDC placed by IR 10/18 Maintain foley for now - was having some retention - when more mobile/up do TOV Avoid nephrotoxic medications including NSAIDs and iodinated intravenous contrast exposure unless the latter is absolutely indicated.  Preferred narcotic agents for pain control are hydromorphone, fentanyl, and methadone. Morphine should not be used. Avoid Baclofen and avoid oral sodium phosphate and magnesium citrate based laxatives / bowel preps. Continue strict Input and Output monitoring. Will monitor the patient closely with you and intervene or adjust therapy as indicated by changes in clinical status/labs  Repeat Na at noon- has had a pretty big jump and pt reports thirst--> if Na is up then will increase FWF +/- 1/2 NS Cardiogenic/septic shock - off pressors now.  Heart failure team had been following. Likely due to aspiration pneumonia. Acute hypoxic respiratory failure - was re-intubated 05/05/22.  Extubated again on 10/14 but failed BiPAP, s/p trach and back on Vent  05/11/22 per PCCM > now tolerating TC 24h/d - pulm following Anemia of critical illness - transfuse for Hgb <7. Requiring PRBCs. Per primary. Acute MI - normal coronaries by cath 10/4 Hyperphosphatemia: Receiving continuous feeds so binders of little efficacy. RD optimized feeds.  Phos normalized. S/p robotic incisional hernia repair with mesh and LOA - Surgery following.  Bipolar/schizophrenia Dispo:  inpatient for multiple issues still -- if she's able to remain  dialysis independent that will really help.

## 2022-05-27 NOTE — Progress Notes (Signed)
NAME:  Patricia [name], MRN:  [id number], DOB:  01-11-1971, LOS: 21 ADMISSION DATE:  04/23/2022, CONSULTATION DATE:  04/29/2022 REFERRING MD:  Felicie Morn, MD, CHIEF COMPLAINT:   Small bowel obstruction, aspiration event, sepsis, respiratory failure    History of Present Illness:  Patricia [name] with above hx and nuc study in 2015 neg for ischemia done for chest pain and echo with EF 60-65% presented to hospital for excisional hernia repair (robotic hernia repair 04/23/22) with mesh, ileus post op on TNA. Developed vomiting and hypotension after NG came out on 04/29/22 and PNA on CXR, was tachycardic and BP improved with IV fluids. DX with sepsis and ABX started. At one point prolonged Qtc. Pt admitted to ICU on 10/2.  Was placed on levophed. Early AM on 10/3 was intubated for worsening metabolic/lactic acidosis. She had AKI as well. Crt was 6.01, her normal 0.9. Today Cr is 4 on CRRT.  Pertinent  Medical History  DM2 Depression OSA Migraines  Asthma Obesity Schizophrenia HTN HLD  Significant Hospital Events: Including procedures, antibiotic start and stop dates in addition to other pertinent events   9/26 Robotic hernia repair complicated by abd adhesions.  10/2 CT + for developing SBO and bilat lower lobe opacities most likely aspiration PNA. Progressive decompensation requiring transfer to ICU 10/2 PCCM consulted for respiratory failure & shock 10\3 Intubated for resp failure. CVL and arterial line placed. 04/30/22: EF 30-35%, RV moderately reduced, mild to moderate MR, IVC collapsed throughout respiratory cycle 10\4 EKG changes/trop increased--> and taken to cath lab - No obstructive CAD, but in decompensated heart failure, Bedside echo with EF 15%, new WMA, moderate RV dysfunction, trivial pericardial effusion.underwent emergent L/RHC. Normal coronaries, RA mean 13, PA mean 29, LVEDP 16, PCWP 19, CO/CI 3.96/1.87   returned to CICU post-cath. Right heart cath placed right fem.  Right IJ HD cath placed.  10/5 advanced HF team following, shock supported on norepi/vasopressin and Dobutamine. On CRRT.  10/6 weaning Norepi. Starting to wean fent. Needing restraints.  10/8: Reintubated due to neuromuscular weakness.  Bedside echo demonstrating improved RV function, LV function remained depressed 10/9 CT abdomen pelvis.suggesting wall thickening throughout the proximal and mid small bowel consistent with possible infectious or inflammatory enteritis there is also bibasilar airspace disease which could reflect pneumonia.  Antibiotic coverage widened.  Zosyn changed to meropenem.  Tolerating trickle feeds still on TPN 10/10 stopped stress dose steroids.  Resumed subcutaneous heparin. 10/13 albumin for coox decrease. Weaning vent . Failed SBT. At goal TF. 10/14 extubated to bipap failed due to weakness, trached 10/15 trial off CRRT, lasix 10/18 RIJ permcath 10/22 transfer 4N as a lateral after aspiration event briefly requiring mechanical ventilation 10/25 transfer to [place] progressive   Interim History / Subjective:  She reports nausea.  Vomited earlier today.  Breathing is okay   Objective   Blood pressure (Abnormal) 148/103, pulse 100, temperature 98.5 F (36.9 C), temperature source Oral, resp. rate (Abnormal) 38, height '5\' 4"'$  (1.626 m), weight 91.4 kg, last menstrual period 07/07/2014, SpO2 95 %.    FiO2 (%):  [28 %] 28 %   Intake/Output Summary (Last 24 hours) at 05/27/2022 1241 Last data filed at 05/27/2022 0648 Gross per 24 hour  Intake 1436.25 ml  Output 1725 ml  Net -288.75 ml   Filed Weights   05/22/22 1845 05/22/22 2349 05/24/22 0348  Weight: 105.4 kg 111.2 kg 91.4 kg    Examination:  General 51 year old female patient sitting upright in bed no  acute distress HEENT normocephalic atraumatic lip wound is improving.  Has a size 6 trach in place to midline phonation quality weak Pulmonary: Coarse scattered rhonchi Cardiac regular rate and rhythm Abdomen  soft not tender Extremities warm dry brisk capillary refill GU clear yellow  Resolved problem list   Aspiration PNA Cardiogenic shock  Acute hypoxic respiratory failure  Prolonged mechanical ventilation sp tracheostomy Pulmonary edema   Assessment & Plan:   Acute metabolic encephalopathy superimposed on hx bipolar disorder, schizophrenia   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 Has  AKI due to septic ATN, no renal recovery S/p hernia repair &  LOA inflammatory enteritis  Anemia, acute on chronic  Acute urinary retention DM2 with hyperglycemia  Severe protein calorie malnutrition  Morbid obesity Pressure injury- lip in the setting of ET tube: Not present on admission  Pulm problem list Tracheostomy dependence  -last SLP note 10/27: recommending PMV and did not feel ready for swallow -O2 needs:  28% aerosol trach collar  Discussion She has progressed since I saw her last in the intensive care, still too debilitated to consider progression from a tracheostomy standpoint.  Had an episode of vomiting earlier today, ileus was what brought her in trouble in the first place  Plan Continue routine trach care Encourage Passy-Muir valve during hours of awake, and under direct supervision of nursing staff for SLP Continue tube feeds 1 view abdomen  We will continue to follow her weekly in regards to her trach hopefully we can work towards decannulation as she gets stronger   Building surveyor (right click and "Reselect all SmartList Selections" daily)   Diet/type: tube feeds DVT prophylaxis: prophylactic heparin  GI prophylaxis: PPI Lines: Dialysis Catheter Foley:  N/A and Yes, and it is still needed  Code Status:  full code  Erick Colace ACNP-BC [place] Pager # [phone] OR # [phone] if no answer

## 2022-05-27 NOTE — Progress Notes (Signed)
Speech Language Pathology Treatment: Patricia [name] Speaking valve  Patient Details Name: Patricia [name] MRN: [id number] DOB: 1971/04/09 Today's Date: 05/27/2022 Time: 1450-1509 SLP Time Calculation (min) (ACUTE ONLY): 19 min  Assessment / Plan / Recommendation Clinical Impression  Patricia [name] had vomiting episode today, so TF are being held and all PO trials per SLP held until notified otherwise. Asking to eat- she is likely ready for an instrumental swallowing study this week. D/W RN.  PMV was placed (cuff deflated at baseline) - pt is demonstrating progress with voice/speech.  Pressure sore on upper lip has healed, which allows more precise articulation.  RR continued to fluctuate widely (up to 40), returning rapidly to high 20s. Sp02, HR were stable. Pt is able to achieve improved quality/volume of phonation. She answered biographical questions with improved intelligibility, however, continues to require max verbal cues to initiate voicing. She tends to mouth words without generating voice.    SLP will continue to follow.  To encourage verbal communication, volume, and cough, please place PMV when present in room with pt. Remove upon departure.    HPI HPI: Patricia [name] is a 51 y.o. female who presented to hospital for excisional hernia repair (robotic hernia repair 04/23/22) with mesh, ileus post op on TNA. Developed vomiting and hypotension after NG came out on 04/29/22 and PNA on CXR, was tachycardic and BP improved with IV fluids. DX with sepsis and ABX started. Pt admitted to ICU on 10/2.  Was placed on levophed. Early AM on 10/3 was intubated for worsening metabolic/lactic acidosis. 10/4 cardiac cath with EF 15%; She had AKI as well. Started CRRT. Extubation 10/14 failed; underwent tracheostomy. TC trials started 10/16. Cleared by surgery to attempt POs 10/27.      SLP Plan  Continue with current plan of care      Recommendations for follow up therapy are one component of a  multi-disciplinary discharge planning process, led by the attending physician.  Recommendations may be updated based on patient status, additional functional criteria and insurance authorization.    Recommendations  Diet recommendations: NPO      Patient may use Passy-Muir Speech Valve: Intermittently with supervision;During all therapies with supervision PMSV Supervision: Full         Oral Care Recommendations: Oral care QID Follow Up Recommendations: Skilled nursing-short term rehab (<3 hours/day) SLP Visit Diagnosis: Aphonia (R49.1) Plan: Continue with current plan of care         Gyasi Hazzard L. Tivis Ringer, MA CCC/SLP Clinical Specialist - Acute Care SLP Acute Rehabilitation Services Office number [phone]  [name]  05/27/2022, 3:20 PM

## 2022-05-27 NOTE — Progress Notes (Signed)
PROGRESS NOTE    Patricia [name]  PZW:[id number] DOB: 1971-06-09 DOA: 04/23/2022 PCP: [name], MD   Brief Narrative:  51 year old with history of DM2, depression, migraine, asthma, schizophrenia, HTN, HLD presented to the hospital for exertional hernia, robotic hernia repair on 04/23/2022 with mesh who developed postop ileus and is on TNA.  Also patient developed vomiting and hypotension after NG tube came out on 04/29/2022 with concerns of pneumonia which was treated with antibiotics and IV fluids.  During that time patient also required Levophed and eventually intubated on 10/3 for worsening metabolic acidosis complicated by acute kidney injury.  Echocardiogram showed depressed EF of 30-35% with elevated troponin.  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  Patient was seen by CHF team started on dopamine and CRRT as well.  Patient was again reintubated due to neuromuscular weakness on 10/8.  Eventually extubated on 10/14 to BiPAP and trach was placed due to weakness.   Assessment & Plan:  Principal Problem:   Septic shock ([place]) Active Problems:   Hernia of abdominal cavity   Aspiration pneumonia (HCC)   Prolonged QT interval   Diabetes mellitus type 2 in obese (HCC)   BIPOLAR DISORDER UNSPECIFIED   Morbid obesity (HCC)   Chronic low back pain   Essential hypertension   Acute systolic heart failure (HCC)   Acute respiratory failure with hypoxia (HCC)   Pressure injury of skin     Assessment and Plan: * Septic shock ([place]) secondary to aspiration pneumonia, right lower lobe Acute respiratory failure with prolonged mechanical ventilation status post tracheostomy -Trach management by pulmonary team. Antibiotics-none; last day was 10/19 - Bronchodilators as needed, supportive care.  Out of bed to chair  Status post hernia repair complicated by postop ileus/inflammatory enteritis -Management by general surgery.  At this time patient remains on tube  feeds, n.p.o. for moderate aspiration risk.  [place] trial.  P.o. intake trial prior surgery team Added Miralax for constipation.   Hypernatremia - We will place her on D5 water 50 cc/h for next 24 hours.  Repeat BMP tomorrow  Acute kidney injury, improving -Initially required CRRT and HD.  Now improving without dialysis  Biventricular failure, reduced EF - Last seen by CHF team 10/16.  Previously tried Bumex with minimal to no urine output.  At this point patient is on dialysis and has been well compensated.  Plans to follow-up outpatient. LHC shows clean coronaries.   Sinus tachycardia; improved.  - Increase metoprolol to 50 mg twice daily.  IV as needed  Anemia of chronic disease - From underlying multiple medical issues.  Transfuse as necessary and attempt to keep hemoglobin greater than 8  Essential hypertension -Currently on metoprolol twice daily  Chronic low back pain -As needed pain control  Morbid obesity (HCC) -Body mass index is 41.37 kg/m..  Currently on tube feeds -Follow-up outpatient, weight loss diet and exercise  BIPOLAR DISORDER UNSPECIFIED -On Klonopin.  Should slowly resume home meds as appropriate  Diabetes mellitus type 2 in obese (HCC) -Sliding scale and Accu-Cheks.  Pressure area on the lip in the setting of ET tube placement which was not present prior to admission.  Routine wound care.  PT-CIR      DVT prophylaxis: Subcu heparin Code Status: Full code Family Communication:    Final dispo per general surgery  Nutritional status    Signs/Symptoms: NPO status (SBO)  Interventions: Refer to RD note for recommendations, Tube feeding, MVI  Body mass index is 34.59  kg/m.  Pressure Injury 05/22/22 Heel Right Stage 1 -  Intact skin with non-blanchable redness of a localized area usually over a bony prominence. (Active)  05/22/22 1157  Location: Heel  Location Orientation: Right  Staging: Stage 1 -  Intact skin with non-blanchable redness of  a localized area usually over a bony prominence.  Wound Description (Comments):   Present on Admission: No     Subjective: Seen in up in the chair, wanting to have some oral intake.  Examination: Constitutional: Not in acute distress Respiratory: Clear to auscultation bilaterally Cardiovascular: Normal sinus rhythm, no rubs Abdomen: Nontender nondistended good bowel sounds Musculoskeletal: No edema noted Skin: No rashes seen Neurologic: CN 2-12 grossly intact.  And nonfocal Psychiatric: Normal judgment and insight. Alert and oriented x 3. Normal mood.  Foley catheter in place Tracheostomy in place Tube feeding in place  Objective: Vitals:   05/26/22 2140 05/26/22 2341 05/27/22 0324 05/27/22 0428  BP:      Pulse: (!) 106   (!) 103  Resp: (!) 26   (!) 26  Temp:  99 F (37.2 C) 99.1 F (37.3 C)   TempSrc:  Oral Oral   SpO2: 100%   96%  Weight:      Height:        Intake/Output Summary (Last 24 hours) at 05/27/2022 0736 Last data filed at 05/27/2022 0648 Gross per 24 hour  Intake 1436.25 ml  Output 1725 ml  Net -288.75 ml   Filed Weights   05/22/22 1845 05/22/22 2349 05/24/22 0348  Weight: 105.4 kg 111.2 kg 91.4 kg     Data Reviewed:   CBC: Recent Labs  Lab 05/22/22 0805 05/23/22 0027 05/24/22 0032 05/25/22 0020  WBC 5.4 5.9 6.9 6.6  HGB 7.6* 7.8* 7.9* 7.6*  HCT 23.3* 24.2* 25.7* 24.6*  MCV 85.0 85.2 88.0 88.2  PLT 376 341 417* 662*   Basic Metabolic Panel: Recent Labs  Lab 05/23/22 0027 05/24/22 0032 05/25/22 0020 05/26/22 0026 05/27/22 0019  NA 135 137 142 143 151*  K 4.0 3.8 4.0 4.0 4.0  CL 97* 101 108 110 116*  CO2 '27 27 27 25 28  '$ GLUCOSE 128* 141* 120* 125* 137*  BUN 57* 82* 98* 103* 93*  CREATININE 3.50* 3.80* 3.57* 3.12* 2.65*  CALCIUM 8.5* 9.2 9.2 8.8* 9.0  MG 2.0 2.4 2.5* 2.5* 2.6*  PHOS 4.4 4.7* 4.8* 4.2 4.1   GFR: Estimated Creatinine Clearance: 27.2 mL/min (A) (by C-G formula based on SCr of 2.65 mg/dL (H)). Liver Function  Tests: Recent Labs  Lab 05/23/22 0027 05/24/22 0032 05/25/22 0020 05/26/22 0026 05/27/22 0019  ALBUMIN 1.8* 1.9* 2.0* 2.0* 1.9*   No results for input(s): "LIPASE", "AMYLASE" in the last 168 hours. No results for input(s): "AMMONIA" in the last 168 hours. Coagulation Profile: No results for input(s): "INR", "PROTIME" in the last 168 hours. Cardiac Enzymes: No results for input(s): "CKTOTAL", "CKMB", "CKMBINDEX", "TROPONINI" in the last 168 hours. BNP (last 3 results) No results for input(s): "PROBNP" in the last 8760 hours. HbA1C: No results for input(s): "HGBA1C" in the last 72 hours. CBG: Recent Labs  Lab 05/26/22 1124 05/26/22 1747 05/26/22 1943 05/26/22 2340 05/27/22 0330  GLUCAP 161* 99 116* 125* 121*   Lipid Profile: No results for input(s): "CHOL", "HDL", "LDLCALC", "TRIG", "CHOLHDL", "LDLDIRECT" in the last 72 hours. Thyroid Function Tests: No results for input(s): "TSH", "T4TOTAL", "FREET4", "T3FREE", "THYROIDAB" in the last 72 hours. Anemia Panel: No results for input(s): "VITAMINB12", "FOLATE", "FERRITIN", "TIBC", "  IRON", "RETICCTPCT" in the last 72 hours. Sepsis Labs: No results for input(s): "PROCALCITON", "LATICACIDVEN" in the last 168 hours.  No results found for this or any previous visit (from the past 240 hour(s)).       Radiology Studies: No results found.      Scheduled Meds:  Chlorhexidine Gluconate Cloth  6 each Topical Q0600   clonazePAM  0.5 mg Per Tube QHS   copper  2 mg Per Tube Daily   feeding supplement (PROSource TF20)  60 mL Per Tube Daily   fiber  1 packet Per Tube BID   heparin injection (subcutaneous)  5,000 Units Subcutaneous Q8H   insulin aspart  0-20 Units Subcutaneous Q4H   insulin aspart  4 Units Subcutaneous Q4H   metoprolol tartrate  50 mg Per Tube BID   multivitamin  1 tablet Per Tube QHS   nutrition supplement (JUVEN)  1 packet Per Tube BID BM   mouth rinse  15 mL Mouth Rinse 4 times per day   pantoprazole  40  mg Per Tube Daily   QUEtiapine  50 mg Per Tube QHS   sodium chloride flush  10-40 mL Intracatheter Q12H   zinc sulfate  220 mg Oral Daily   Continuous Infusions:  sodium chloride 10 mL/hr at 05/20/22 1900   albumin human     dextrose     feeding supplement (PEPTAMEN 1.5 CAL) 55 mL/hr at 05/27/22 0648     LOS: 33 days   Time spent= 35 mins    Avrohom Mckelvin Arsenio Loader, MD [facility]  If 7PM-7AM, please contact night-coverage  05/27/2022, 7:36 AM
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
## 2022-05-27 NOTE — Progress Notes (Signed)
Inpatient Rehab Admissions Coordinator:    CIR following, but Pt. Is not medically ready. Will follow for potential admit pending medical readiness. I am in contact with pt.'s sisters regarding support at d/c and they are working on a plan for 24/7 support.   Clemens Catholic, [place], [place] Admissions Coordinator  [phone] ([place]) [phone] (office)

## 2022-05-28 ENCOUNTER — Inpatient Hospital Stay (HOSPITAL_COMMUNITY): Payer: Medicaid Other

## 2022-05-28 DIAGNOSIS — R6521 Severe sepsis with septic shock: Secondary | ICD-10-CM | POA: Diagnosis not present

## 2022-05-28 DIAGNOSIS — A419 Sepsis, unspecified organism: Secondary | ICD-10-CM | POA: Diagnosis not present

## 2022-05-28 LAB — RENAL FUNCTION PANEL
Albumin: 2.1 g/dL — ABNORMAL LOW (ref 3.5–5.0)
Anion gap: 6 (ref 5–15)
BUN: 83 mg/dL — ABNORMAL HIGH (ref 6–20)
CO2: 27 mmol/L (ref 22–32)
Calcium: 9.1 mg/dL (ref 8.9–10.3)
Chloride: 120 mmol/L — ABNORMAL HIGH (ref 98–111)
Creatinine, Ser: 2.44 mg/dL — ABNORMAL HIGH (ref 0.44–1.00)
GFR, Estimated: 23 mL/min — ABNORMAL LOW (ref >60)
Glucose, Bld: 120 mg/dL — ABNORMAL HIGH (ref 70–99)
Phosphorus: 4.2 mg/dL (ref 2.5–4.6)
Potassium: 4.2 mmol/L (ref 3.5–5.1)
Sodium: 153 mmol/L — ABNORMAL HIGH (ref 135–145)

## 2022-05-28 LAB — GLUCOSE, CAPILLARY
Glucose-Capillary: 107 mg/dL — ABNORMAL HIGH (ref 70–99)
Glucose-Capillary: 114 mg/dL — ABNORMAL HIGH (ref 70–99)
Glucose-Capillary: 115 mg/dL — ABNORMAL HIGH (ref 70–99)
Glucose-Capillary: 116 mg/dL — ABNORMAL HIGH (ref 70–99)
Glucose-Capillary: 119 mg/dL — ABNORMAL HIGH (ref 70–99)
Glucose-Capillary: 148 mg/dL — ABNORMAL HIGH (ref 70–99)

## 2022-05-28 LAB — CBC
HCT: 26.9 % — ABNORMAL LOW (ref 36.0–46.0)
Hemoglobin: 8.3 g/dL — ABNORMAL LOW (ref 12.0–15.0)
MCH: 28.1 pg (ref 26.0–34.0)
MCHC: 30.9 g/dL (ref 30.0–36.0)
MCV: 91.2 fL (ref 80.0–100.0)
Platelets: 598 10*3/uL — ABNORMAL HIGH (ref 150–400)
RBC: 2.95 MIL/uL — ABNORMAL LOW (ref 3.87–5.11)
RDW: 16.6 % — ABNORMAL HIGH (ref 11.5–15.5)
WBC: 10.3 10*3/uL (ref 4.0–10.5)
nRBC: 0 % (ref 0.0–0.2)

## 2022-05-28 LAB — BASIC METABOLIC PANEL
Anion gap: 8 (ref 5–15)
BUN: 65 mg/dL — ABNORMAL HIGH (ref 6–20)
CO2: 24 mmol/L (ref 22–32)
Calcium: 9.1 mg/dL (ref 8.9–10.3)
Chloride: 117 mmol/L — ABNORMAL HIGH (ref 98–111)
Creatinine, Ser: 2.11 mg/dL — ABNORMAL HIGH (ref 0.44–1.00)
GFR, Estimated: 28 mL/min — ABNORMAL LOW (ref >60)
Glucose, Bld: 123 mg/dL — ABNORMAL HIGH (ref 70–99)
Potassium: 4.8 mmol/L (ref 3.5–5.1)
Sodium: 149 mmol/L — ABNORMAL HIGH (ref 135–145)

## 2022-05-28 LAB — MAGNESIUM: Magnesium: 2.5 mg/dL — ABNORMAL HIGH (ref 1.7–2.4)

## 2022-05-28 MED ORDER — DICLOFENAC SODIUM 1 % EX GEL
2.0000 g | Freq: Four times a day (QID) | CUTANEOUS | Status: AC
  Administered 2022-05-28 – 2022-06-03 (×23): 2 g via TOPICAL
  Filled 2022-05-28: qty 100

## 2022-05-28 MED ORDER — FREE WATER
300.0000 mL | Status: DC
  Administered 2022-05-28 (×2): 300 mL

## 2022-05-28 MED ORDER — PANTOPRAZOLE SODIUM 40 MG IV SOLR
40.0000 mg | Freq: Every day | INTRAVENOUS | Status: DC
  Administered 2022-05-28: 40 mg via INTRAVENOUS
  Filled 2022-05-28: qty 10

## 2022-05-28 MED ORDER — FAMOTIDINE 20 MG PO TABS: 10.0000 mg | ORAL_TABLET | Freq: Every day | ORAL | Status: DC

## 2022-05-28 MED ORDER — INSULIN ASPART 100 UNIT/ML IJ SOLN
4.0000 [IU] | Freq: Four times a day (QID) | INTRAMUSCULAR | Status: DC
  Administered 2022-05-28 – 2022-05-29 (×4): 4 [IU] via SUBCUTANEOUS

## 2022-05-28 MED ORDER — INSULIN ASPART 100 UNIT/ML IJ SOLN
0.0000 [IU] | Freq: Four times a day (QID) | INTRAMUSCULAR | Status: DC
  Administered 2022-05-28 – 2022-06-03 (×7): 3 [IU] via SUBCUTANEOUS
  Administered 2022-06-03: 4 [IU] via SUBCUTANEOUS
  Administered 2022-06-03 – 2022-06-04 (×4): 3 [IU] via SUBCUTANEOUS
  Administered 2022-06-05: 4 [IU] via SUBCUTANEOUS
  Administered 2022-06-05: 3 [IU] via SUBCUTANEOUS
  Administered 2022-06-05: 4 [IU] via SUBCUTANEOUS
  Administered 2022-06-06 – 2022-06-07 (×7): 3 [IU] via SUBCUTANEOUS
  Administered 2022-06-09: 4 [IU] via SUBCUTANEOUS
  Administered 2022-06-11: 3 [IU] via SUBCUTANEOUS
  Administered 2022-06-17: 1 [IU] via SUBCUTANEOUS
  Administered 2022-06-18 – 2022-06-21 (×3): 3 [IU] via SUBCUTANEOUS

## 2022-05-28 MED ORDER — DEXTROSE 5 % IV SOLN: INTRAVENOUS | Status: AC

## 2022-05-28 MED ORDER — LABETALOL HCL 5 MG/ML IV SOLN
10.0000 mg | Freq: Once | INTRAVENOUS | Status: AC
  Administered 2022-05-28: 10 mg via INTRAVENOUS
  Filled 2022-05-28: qty 4

## 2022-05-28 NOTE — Progress Notes (Signed)
Occupational Therapy Treatment Patient Details Name: Patricia [name] MRN: [id number] DOB: 1970/11/15 Today's Date: 05/28/2022   History of present illness 51 yo female admitted 04/23/22 for excisional hernia repair with mesh. Course complicated by post-op ileus, tachycardia, PNA, sepsis. Transfer to ICU 10/2 on levophed. Worsening acidosis 10/3 requiring intubation. Cardiac cath 10/4 with EF 15%. Pt with AKI; on CRRT 10/5-10/14. Failed extubation 10/8 and 10/14; s/p trach 10/14. PMH includes depression, OSA, asthma, schizophrenia, DM, insomnia.   OT comments  Patricia [name] is incrementally progressing, she was seen with PT to safety progress activity tolerance. Pt in chair upon arrival, attempted to don PMV for session, pt did not tolerate. Removed and donned for 2nd trial with better tolerance, but no vocalizations made. Session focused on standing trials in preparation for OOB ADLs. She was unable to obtain full upright posture in the steady frame despite total A +2. Pt communicating well with gestures and handwriting throughout, and followed all commands. OT to continue to follow. POC remains appropriate in anticipation pt's activity tolerance will improve acutely    Recommendations for follow up therapy are one component of a multi-disciplinary discharge planning process, led by the attending physician.  Recommendations may be updated based on patient status, additional functional criteria and insurance authorization.    Follow Up Recommendations  Acute inpatient rehab (3hours/day)    Assistance Recommended at Discharge Frequent or constant Supervision/Assistance  Patient can return home with the following  Two people to help with walking and/or transfers;Two people to help with bathing/dressing/bathroom;Assist for transportation;Help with stairs or ramp for entrance;Assistance with cooking/housework;Direct supervision/assist for medications management   Equipment Recommendations  Other  (comment)    Recommendations for Other Services Rehab consult    Precautions / Restrictions Precautions Precautions: Fall;Other (comment) Precaution Comments: trach collar, PMSV, cortrak, foley Restrictions Weight Bearing Restrictions: No       Mobility Bed Mobility Overal bed mobility: Needs Assistance             General bed mobility comments: OOB in chiar upon arrival. Hoyer sling used.    Transfers Overall transfer level: Needs assistance Equipment used: Ambulation equipment used Transfers: Sit to/from Stand Sit to Stand: +2 physical assistance, Total assist           General transfer comment: three trials of standing from stedy +2, able to clear hips on final trial but unsuccessful for complete upright positioning. Pt utilized UEs appropriately by bringing them to stedy bar in preparation for transfer Transfer via Lift Equipment: Stedy   Balance Overall balance assessment: Needs assistance Sitting-balance support: Feet supported, Feet unsupported Sitting balance-Leahy Scale: Fair Sitting balance - Comments: sitting unsupported in recliner upon arrival with no LOB and able to maintain upright posture after weight shifting   Standing balance support: Bilateral upper extremity supported Standing balance-Leahy Scale: Poor Standing balance comment: trials of standing with assistive equipment not successful today                           ADL either performed or assessed with clinical judgement   ADL Overall ADL's : Needs assistance/impaired Eating/Feeding: NPO   Grooming: Maximal assistance Grooming Details (indicate cue type and reason): pt not managing her secretions well, max A for cues and assist                 Toilet Transfer: Total assistance;+2 for physical assistance;+2 for safety/equipment Toilet Transfer Details (indicate cue type and reason): simulated. with use  of steady         Functional mobility during ADLs: Total  assistance;+2 for physical assistance;+2 for safety/equipment General ADL Comments: attempted standing trials this date with steady frame. unable to obtain full upright posture. pt did not tolerate PMV    Extremity/Trunk Assessment Upper Extremity Assessment Upper Extremity Assessment: RUE deficits/detail;LUE deficits/detail RUE Deficits / Details: generally weak, able to lift and grasp for use of steady frame RUE Sensation: WNL RUE Coordination: decreased fine motor;decreased gross motor LUE Deficits / Details: generally weak, able to lift and grasp for use of steady frame LUE Sensation: WNL LUE Coordination: decreased fine motor;decreased gross motor   Lower Extremity Assessment Lower Extremity Assessment: Defer to PT evaluation        Vision   Vision Assessment?: No apparent visual deficits   Perception Perception Perception: Not tested   Praxis Praxis Praxis: Not tested    Cognition Arousal/Alertness: Awake/alert Behavior During Therapy: Flat affect Overall Cognitive Status: Difficult to assess Area of Impairment: Attention, Following commands, Safety/judgement, Problem solving                   Current Attention Level: Sustained   Following Commands: Follows one step commands with increased time Safety/Judgement: Decreased awareness of safety, Decreased awareness of deficits   Problem Solving: Requires tactile cues, Requires verbal cues, Slow processing, Decreased initiation General Comments: difficult to assess with tc, did not tolerate PMV this date. able to write wants/needs/concerns with pena nd paper. Also appropriately using gestures and mouthing. Followed all commands              General Comments Pre mobility BP 145/111. Continue to trial passy muir valve, pt did not tolerate well today with two attempted applications    Pertinent Vitals/ Pain       Pain Assessment Pain Assessment: Faces Faces Pain Scale: Hurts little more Pain Location:  Stomach and R knee Pain Descriptors / Indicators: Grimacing, Guarding Pain Intervention(s): Monitored during session, Limited activity within patient's tolerance   Frequency  Min 2X/week        Progress Toward Goals  OT Goals(current goals can now be found in the care plan section)  Progress towards OT goals: Progressing toward goals  Acute Rehab OT Goals Patient Stated Goal: did not state OT Goal Formulation: With patient Time For Goal Achievement: 06/10/22 Potential to Achieve Goals: Good ADL Goals Pt Will Perform Grooming: with mod assist;sitting Pt Will Perform Upper Body Dressing: with mod assist;sitting Pt Will Perform Lower Body Dressing: with max assist;sit to/from stand Pt Will Transfer to Toilet: with max assist;stand pivot transfer;bedside commode Additional ADL Goal #1: pt will complete bed mobility with mod A as a precursor to ADLs  Plan Discharge plan remains appropriate (pending acute activity tolerance progression)    Co-evaluation    PT/OT/SLP Co-Evaluation/Treatment: Yes Reason for Co-Treatment: Complexity of the patient's impairments (multi-system involvement);For patient/therapist safety;To address functional/ADL transfers PT goals addressed during session: Mobility/safety with mobility OT goals addressed during session: ADL's and self-care      AM-PAC OT "6 Clicks" Daily Activity     Outcome Measure   Help from another person eating meals?: Total Help from another person taking care of personal grooming?: A Lot Help from another person toileting, which includes using toliet, bedpan, or urinal?: Total Help from another person bathing (including washing, rinsing, drying)?: A Lot Help from another person to put on and taking off regular upper body clothing?: A Lot Help from another person to put  on and taking off regular lower body clothing?: Total 6 Click Score: 9    End of Session    OT Visit Diagnosis: Unsteadiness on feet (R26.81);Muscle  weakness (generalized) (M62.81);Other symptoms and signs involving cognitive function                 Time: 4210-3128 OT Time Calculation (min): 34 min  Charges: OT General Charges $OT Visit: 1 Visit OT Treatments $Therapeutic Activity: 8-22 mins    Elliot Cousin 05/28/2022, 12:53 PM

## 2022-05-28 NOTE — Progress Notes (Signed)
27 Days Post-Op   Subjective/Chief Complaint: Threw up when working with speech eating an ice cube Having bowel movements. Complaining of right upper quadrant pain. Refusing some medications.    Objective: Vital signs in last 24 hours: Temp:  [97.6 F (36.4 C)-100.8 F (38.2 C)] 98.8 F (37.1 C) (10/31 1622) Pulse Rate:  [91-112] 95 (10/31 1622) Resp:  [14-22] 15 (10/31 1622) BP: (141-147)/(102-112) 146/108 (10/31 1622) SpO2:  [98 %-100 %] 99 % (10/31 1622) FiO2 (%):  [28 %] 28 % (10/31 1449) Weight:  [93.7 kg] 93.7 kg (10/31 0500) Last BM Date : 05/26/22  Intake/Output from previous day: 10/30 0701 - 10/31 0700 In: 747.6 [I.V.:747.6] Out: 1900 [Urine:1900] Intake/Output this shift: No intake/output data recorded.  Trach in place Abd - soft, RUQ mild tenderness  Lab Results:  Recent Labs    05/28/22 0024  WBC 10.3  HGB 8.3*  HCT 26.9*  PLT 598*    BMET Recent Labs    05/28/22 0024 05/28/22 1813  NA 153* 149*  K 4.2 4.8  CL 120* 117*  CO2 27 24  GLUCOSE 120* 123*  BUN 83* 65*  CREATININE 2.44* 2.11*  CALCIUM 9.1 9.1    PT/INR No results for input(s): "LABPROT", "INR" in the last 72 hours. ABG No results for input(s): "PHART", "HCO3" in the last 72 hours.  Invalid input(s): "PCO2", "PO2"  Studies/Results: DG Knee Right Port  Result Date: 05/28/2022 CLINICAL DATA:  Knee pain.  Septic shock EXAM: PORTABLE RIGHT KNEE - 1-2 VIEW COMPARISON:  None Available. FINDINGS: No evidence of fracture, dislocation, or joint effusion. No evidence of arthropathy or other focal bone abnormality. Soft tissues are unremarkable. IMPRESSION: Negative. Electronically Signed   By: Franchot Gallo M.D.   On: 05/28/2022 11:03   DG Chest Port 1 View  Result Date: 05/27/2022 CLINICAL DATA:  Aspiration of gastric contents. EXAM: PORTABLE CHEST 1 VIEW COMPARISON:  Chest radiograph 05/20/2022. FINDINGS: Tracheostomy tube tip at the thoracic inlet. Weighted enteric tube in  place with tip below the diaphragm not included in the field of view. Stable right internal jugular dialysis catheter. Lung volumes are low. Similar bibasilar atelectasis. No new airspace disease. Stable heart size and mediastinal contours. No pneumothorax or large pleural effusion. No pulmonary edema. IMPRESSION: 1. Low lung volumes with unchanged bibasilar atelectasis. 2. Stable support apparatus. Electronically Signed   By: Keith Rake M.D.   On: 05/27/2022 19:31   DG Abd Portable 1V  Result Date: 05/27/2022 CLINICAL DATA:  Nausea EXAM: PORTABLE ABDOMEN - 1 VIEW COMPARISON:  05/22/2022 FINDINGS: Limited exam secondary to poor penetration related to patient body habitus. Feeding tube is again seen with distal tip projecting near the duodenojejunal junction. Single mildly dilated loop of small bowel in the left hemiabdomen. No gross free intraperitoneal air on supine imaging. IMPRESSION: Limited exam. Single mildly dilated loop of small bowel in the left hemiabdomen, nonspecific and could reflect a focal ileus or enteritis. Developing obstruction not excluded. Electronically Signed   By: Davina Poke D.O.   On: 05/27/2022 14:32    Anti-infectives: Anti-infectives (From admission, onward)    Start     Dose/Rate Route Frequency Ordered Stop   05/15/22 1445  ceFAZolin (ANCEF) IVPB 2g/100 mL premix       Note to Pharmacy: To be given in IR   2 g 200 mL/hr over 30 Minutes Intravenous  Once 05/15/22 1352 05/15/22 1616   05/15/22 1230  ceFAZolin (ANCEF) powder 1 g  Status:  Discontinued  1 g Other To Surgery 05/15/22 1140 05/15/22 1239   05/15/22 1230  ceFAZolin (ANCEF) IVPB 2g/100 mL premix        2 g 200 mL/hr over 30 Minutes Intravenous To Radiology 05/15/22 1140 05/15/22 1140   05/15/22 0000  ceFAZolin (ANCEF) IVPB 2g/100 mL premix        2 g 200 mL/hr over 30 Minutes Intravenous To Radiology 05/14/22 1308 05/15/22 0005   05/13/22 2134  meropenem (MERREM) 500 mg in sodium chloride  0.9 % 100 mL IVPB        500 mg 200 mL/hr over 30 Minutes Intravenous Every 24 hours 05/13/22 0815 05/17/22 0700   05/13/22 1000  meropenem (MERREM) 500 mg in sodium chloride 0.9 % 100 mL IVPB  Status:  Discontinued        500 mg 200 mL/hr over 30 Minutes Intravenous Every 12 hours 05/12/22 0940 05/12/22 0946   05/12/22 2200  meropenem (MERREM) 500 mg in sodium chloride 0.9 % 100 mL IVPB  Status:  Discontinued        500 mg 200 mL/hr over 30 Minutes Intravenous Every 12 hours 05/12/22 0946 05/13/22 0815   05/12/22 2000  meropenem (MERREM) 1 g in sodium chloride 0.9 % 100 mL IVPB  Status:  Discontinued        1 g 200 mL/hr over 30 Minutes Intravenous  Once 05/12/22 0940 05/12/22 0946   05/06/22 1530  meropenem (MERREM) 1 g in sodium chloride 0.9 % 100 mL IVPB  Status:  Discontinued        1 g 200 mL/hr over 30 Minutes Intravenous Every 8 hours 05/06/22 1438 05/12/22 0940   05/04/22 1000  vancomycin (VANCOCIN) IVPB 1000 mg/200 mL premix  Status:  Discontinued        1,000 mg 200 mL/hr over 60 Minutes Intravenous Every 24 hours 05/03/22 0802 05/08/22 0913   05/03/22 0845  vancomycin (VANCOREADY) IVPB 2000 mg/400 mL        2,000 mg 200 mL/hr over 120 Minutes Intravenous  Once 05/03/22 0751 05/03/22 1114   05/01/22 1800  piperacillin-tazobactam (ZOSYN) IVPB 3.375 g  Status:  Discontinued        3.375 g 12.5 mL/hr over 240 Minutes Intravenous Every 6 hours 05/01/22 1426 05/01/22 1427   05/01/22 1515  piperacillin-tazobactam (ZOSYN) IVPB 3.375 g  Status:  Discontinued        3.375 g 100 mL/hr over 30 Minutes Intravenous Every 6 hours 05/01/22 1428 05/06/22 1438   04/30/22 1400  piperacillin-tazobactam (ZOSYN) IVPB 2.25 g  Status:  Discontinued        2.25 g 100 mL/hr over 30 Minutes Intravenous Every 8 hours 04/30/22 1325 05/01/22 1426   04/29/22 1530  piperacillin-tazobactam (ZOSYN) IVPB 3.375 g  Status:  Discontinued        3.375 g 12.5 mL/hr over 240 Minutes Intravenous Every 8 hours  04/29/22 1433 04/30/22 1325   04/29/22 1145  metroNIDAZOLE (FLAGYL) IVPB 500 mg  Status:  Discontinued        500 mg 100 mL/hr over 60 Minutes Intravenous Every 12 hours 04/29/22 1047 04/29/22 1433   04/29/22 1145  vancomycin (VANCOCIN) IVPB 1000 mg/200 mL premix  Status:  Discontinued        1,000 mg 200 mL/hr over 60 Minutes Intravenous  Once 04/29/22 1047 04/29/22 1055   04/29/22 1145  vancomycin (VANCOREADY) IVPB 1500 mg/300 mL  Status:  Discontinued        1,500 mg 150 mL/hr over 120  Minutes Intravenous Every 24 hours 04/29/22 1055 04/29/22 1357   04/29/22 1045  ceFEPIme (MAXIPIME) 2 g in sodium chloride 0.9 % 100 mL IVPB  Status:  Discontinued        2 g 200 mL/hr over 30 Minutes Intravenous Every 8 hours 04/29/22 0954 04/29/22 1433   04/23/22 0730  ceFAZolin (ANCEF) IVPB 2g/100 mL premix        2 g 200 mL/hr over 30 Minutes Intravenous On call to O.R. 04/23/22 0726 04/23/22 [id number]       Assessment/Plan: Ms. Burgio is s/p robotic incisional hernia repair on 04/23/22 -Postop ileus complicated by aspiration: Ileus resolved, having bowel function though unreliable.  Will schedule miralax daily as it seems she may be getting constipated - Inflammatory enteritis likely due to ischemic insult with dilated bowel at time of high pressor support - small bowel dilation on x-ray improved - Abdomen with some RUQ tenderness today - Ulcers less likely as patient has been on protonix, bowel blockage or issue unlikely as patient having bowel movements, could have cholecystitis but no stones on ultrasound, WBC only 10.  For now lets monitor her abdominal exam and recheck labs in the morning. - Encouraged her not to refuse medications. - Canceled 300 mL free water boluses as this may be too much at once for her small duodenal feeding tube - TF as tolerated, okay for speech eval for diet as tolerated   Acute metabolic encephalopathy superimposed on hx bipolar disorder, schizophrenia   ICU delirium-  improved now off all IV infusions. Acute biventricular HFrEF (Etiology sepsis, stress induced CM. Has clean coronaries) Cardiogenic shock resolved, EF improved.  Acute hypoxic respiratory failure Aspiration PNA Pulm edema   RLL VAP  AKI with oliguria; no baseline CKD   - appears to have some renal recovery Anemia, acute on chronic  DM2 with hyperglycemia  Severe protein calorie malnutrition  Pressure injury lip - improving  Deconditioning    Appreciate [facility] care with multiple issues   Needs therapy and recouperation     Dispo - Remain in stepdown for trach care, case Management consult for placement - difficult with many needs, insurance limitation.  Would be great if patient is accepted to inpatient rehab.    LOS: 34 days    Nickola Major Tracye Szuch 05/28/2022

## 2022-05-28 NOTE — Progress Notes (Addendum)
Speech Language Pathology Treatment: Dysphagia;Passy Muir Speaking valve  Patient Details Name: Patricia [name] MRN: [id number] DOB: 1971/04/11 Today's Date: 05/28/2022 Time: 1340-1410 SLP Time Calculation (min) (ACUTE ONLY): 30 min  Assessment / Plan / Recommendation Clinical Impression  Patricia [name] was seen for PMV and swallowing treatment. Per RN, she'd been asking for ice chips all day. TF has resumed.  Cuff was found partially inflated upon entering room. This therapist deflated cuff. PMV was placed and pt achieved relatively clear phonation. Speech was intelligible. Demonstrated patent upper airway; VS remained stable during valve use. She was enthusiastic and ready to try some ice chips.    Demonstrated palpable swallow, good oral control.  She was cued to cough in an effort to expel secretions orally. Coughing was associated with a gag reflex; she affirmed feeling nauseated. HOB was elevated; she began to vomit; valve was removed and cuff was re-inflated. After vomiting ceased, pt coughed and expelled emesis from trach.  Notified Patricia [name] and Dr. Reesa Chew that emesis was aspirated. RN was present during episode.   Left pt with cuff inflated and provided encouragement.  Was hoping to proceed with instrumental swallow study today or tomorrow.   Will discuss with team and continue to follow for readiness.    HPI HPI: Patricia [name] is a 51 y.o. female who presented to hospital for excisional hernia repair (robotic hernia repair 04/23/22) with mesh, ileus post op on TNA. Developed vomiting and hypotension after NG came out on 04/29/22 and PNA on CXR, was tachycardic and BP improved with IV fluids. DX with sepsis and ABX started. Pt admitted to ICU on 10/2.  Was placed on levophed. Early AM on 10/3 was intubated for worsening metabolic/lactic acidosis. 10/4 cardiac cath with EF 15%; She had AKI as well. Started CRRT. Extubation 10/14 failed; underwent tracheostomy. TC trials started 10/16.  Cleared by surgery to attempt POs 10/27.      SLP Plan  Continue with current plan of care      Recommendations for follow up therapy are one component of a multi-disciplinary discharge planning process, led by the attending physician.  Recommendations may be updated based on patient status, additional functional criteria and insurance authorization.    Recommendations  Diet recommendations: NPO Medication Administration: Via alternative means      Patient may use Passy-Muir Speech Valve: Intermittently with supervision;During all therapies with supervision PMSV Supervision: Full MD: Please consider changing trach tube to : Cuffless         Oral Care Recommendations: Oral care QID Follow Up Recommendations: Skilled nursing-short term rehab (<3 hours/day) SLP Visit Diagnosis: Aphonia (R49.1) Plan: Continue with current plan of care         Aralyn Nowak L. Tivis Ringer, MA CCC/SLP Clinical Specialist - Acute Care SLP Acute Rehabilitation Services Office number [phone]   [name]  05/28/2022, 2:26 PM

## 2022-05-28 NOTE — Progress Notes (Signed)
Inpatient Rehab Admissions Coordinator:    CIR following for potential admit once medically ready. Awaiting response from Pt.'s sister to see if they are able to take her home after CIR as well.   Clemens Catholic, [place], [place] Admissions Coordinator  [phone] ([place]) [phone] (office)

## 2022-05-28 NOTE — Progress Notes (Signed)
Physical Therapy Treatment Patient Details Name: Patricia [name] MRN: [id number] DOB: 11-17-1970 Today's Date: 05/28/2022   History of Present Illness 51 yo female admitted 04/23/22 for excisional hernia repair with mesh. Course complicated by post-op ileus, tachycardia, PNA, sepsis. Transfer to ICU 10/2 on levophed. Worsening acidosis 10/3 requiring intubation. Cardiac cath 10/4 with EF 15%. Pt with AKI; on CRRT 10/5-10/14. Failed extubation 10/8 and 10/14; s/p trach 10/14. PMH includes depression, OSA, asthma, schizophrenia, DM, insomnia.    PT Comments    Pt slowly progressing with mobility. Today's session focused on unsupported sitting balance and sit to stand trial. Pt demonstrated ability to weight shift in chair for repositioning, requiring assistance to scoot hips fwd and backward in chair. Three trials of standing from recliner with stedy, unsuccessful for full ascent but did clear hips totalA+2 on final attempt. Pt waxes and wanes on mobility from day to day, possible impact of mental health component related to prolonged hospitalization. Pt remains limited by generalized weakness, decreased activity tolerance, and impaired balance strategies/postural reactions. Continue to recommend acute PT services to maximize functional mobility and independence prior to AIR level therapies pending activity tolerance.    Recommendations for follow up therapy are one component of a multi-disciplinary discharge planning process, led by the attending physician.  Recommendations may be updated based on patient status, additional functional criteria and insurance authorization.  Follow Up Recommendations  Acute inpatient rehab (3hours/day) Can patient physically be transported by private vehicle: No   Assistance Recommended at Discharge Frequent or constant Supervision/Assistance  Patient can return home with the following Two people to help with walking and/or transfers;Two people to help with  bathing/dressing/bathroom;Assistance with cooking/housework;Assistance with feeding;Direct supervision/assist for medications management;Direct supervision/assist for financial management;Assist for transportation;Help with stairs or ramp for entrance   Equipment Recommendations  Hospital bed;Wheelchair (measurements PT);Wheelchair cushion (measurements PT);Other (comment)    Recommendations for Other Services       Precautions / Restrictions Precautions Precautions: Fall;Other (comment) Precaution Comments: trach collar, PMSV, cortrak, foley Restrictions Weight Bearing Restrictions: No     Mobility  Bed Mobility Overal bed mobility: Needs Assistance             General bed mobility comments: Pt sitting up in recliner upon arrival. Did demonstrate scooting back in chair minA+2, and able to weight shift side to side for better LE positioning    Transfers Overall transfer level: Needs assistance Equipment used: Ambulation equipment used Transfers: Sit to/from Stand Sit to Stand: +2 physical assistance, Total assist           General transfer comment: three trials of standing from stedy +2, able to clear hips on final trial but unsuccessful for complete upright positioning. Pt utilized UEs appropriately by bringing them to stedy bar in preparation for transfer Transfer via Lift Equipment: Stedy  Ambulation/Gait                   Stairs             Wheelchair Mobility    Modified Rankin (Stroke Patients Only)       Balance Overall balance assessment: Needs assistance Sitting-balance support: Feet supported, Feet unsupported Sitting balance-Leahy Scale: Fair Sitting balance - Comments: sitting unsupported in recliner upon arrival with no LOB and able to maintain upright posture after weight shifting   Standing balance support: Bilateral upper extremity supported   Standing balance comment: trials of standing with assistive equipment not successful  today  Cognition Arousal/Alertness: Awake/alert Behavior During Therapy: Flat affect Overall Cognitive Status: Difficult to assess Area of Impairment: Attention, Following commands, Safety/judgement, Problem solving                   Current Attention Level: Sustained   Following Commands: Follows one step commands with increased time Safety/Judgement: Decreased awareness of safety, Decreased awareness of deficits   Problem Solving: Requires tactile cues, Requires verbal cues, Slow processing, Decreased initiation General Comments: Cognition difficult to assess as pt was not tolerating passy muir valve well, although pt did appear more willing to participate during todays session. Able to follow commands during co-treat session with appropriate responses        Exercises      General Comments General comments (skin integrity, edema, etc.): Pre mobility BP 145/111. Continue to trial passy muir valve, pt did not tolerate well today with two attempted applications      Pertinent Vitals/Pain Pain Assessment Pain Assessment: Faces Faces Pain Scale: Hurts little more Pain Location: Stomach and R knee Pain Descriptors / Indicators: Grimacing, Guarding Pain Intervention(s): Monitored during session    Home Living                          Prior Function            PT Goals (current goals can now be found in the care plan section) Acute Rehab PT Goals Patient Stated Goal: "Get me out of bed" PT Goal Formulation: With patient Time For Goal Achievement: 06/06/22 Potential to Achieve Goals: Fair Progress towards PT goals: Progressing toward goals    Frequency    Min 3X/week      PT Plan Current plan remains appropriate    Co-evaluation PT/OT/SLP Co-Evaluation/Treatment: Yes Reason for Co-Treatment: For patient/therapist safety;To address functional/ADL transfers PT goals addressed during session: Mobility/safety  with mobility        AM-PAC PT "6 Clicks" Mobility   Outcome Measure  Help needed turning from your back to your side while in a flat bed without using bedrails?: A Lot Help needed moving from lying on your back to sitting on the side of a flat bed without using bedrails?: Total Help needed moving to and from a bed to a chair (including a wheelchair)?: Total Help needed standing up from a chair using your arms (e.g., wheelchair or bedside chair)?: Total Help needed to walk in hospital room?: Total Help needed climbing 3-5 steps with a railing? : Total 6 Click Score: 7    End of Session Equipment Utilized During Treatment: Gait belt Activity Tolerance: Patient limited by fatigue Patient left: in chair;with call bell/phone within reach;with chair alarm set Nurse Communication: Mobility status;Other (comment) (abdominal pain) PT Visit Diagnosis: Muscle weakness (generalized) (M62.81);Difficulty in walking, not elsewhere classified (R26.2);Other abnormalities of gait and mobility (R26.89);Unsteadiness on feet (R26.81)     Time: 3295-1884 PT Time Calculation (min) (ACUTE ONLY): 32 min  Charges:  $Therapeutic Activity: 8-22 mins                    Chipper Oman, SPT    [place] Belisa Eichholz 05/28/2022, 12:22 PM

## 2022-05-28 NOTE — Progress Notes (Signed)
Mobility Specialist Progress Note    05/28/22 0951  Mobility  Activity Transferred from bed to chair  Level of Assistance +2 (takes two people)  Assistive Device MaxiMove  Activity Response Tolerated well  Mobility Referral Yes  $Mobility charge 1 Mobility   No complaints. Left with NT present.   Hildred Alamin Mobility Specialist  Secure Chat Only

## 2022-05-28 NOTE — Progress Notes (Signed)
Pt has been nauseated intermittent all day. Pt did not want medications. RN to all patient to rest. Patient complained about abd pain. Medication administered. RN notified MD. Speech at bedside to trial ice chips. Pt had vomiting episode. Pt complains about abd pain. RN unable to administer medications. MD made aware 74 RN notified MD of elevated BP. MD gave verbal order for IV Labetalol 10 mg.

## 2022-05-28 NOTE — Progress Notes (Signed)
[facility] Progress Note   Assessment/ Plan:   AKI - likely ATN in the setting of cardiogenic and septic shock. CRRT 10/4-10/14/23 via RIJ temp HD catheter. First IHD 10/16.  Looks to be in early phase of renal recovery - UOP really up over past 4-5 days. Looks like post ATN diuresis + catabolic state.   Appears to be recovering, do not think dialysis indicated.  Continue monitoring x 1 more day and if more improved esp with correction of Na will d/c TDC Maintain foley for now - was having some retention - when more mobile/up do TOV Avoid nephrotoxic medications including NSAIDs and iodinated intravenous contrast exposure unless the latter is absolutely indicated.  Preferred narcotic agents for pain control are hydromorphone, fentanyl, and methadone. Morphine should not be used. Avoid Baclofen and avoid oral sodium phosphate and magnesium citrate based laxatives / bowel preps. Continue strict Input and Output monitoring. Will monitor the patient closely with you and intervene or adjust therapy as indicated by changes in clinical status/labs   Cardiogenic/septic shock - off pressors now.  Heart failure team had been following. Likely due to aspiration pneumonia. Acute hypoxic respiratory failure - was re-intubated 05/05/22.  Extubated again on 10/14 but failed BiPAP, s/p trach and back on Vent 05/11/22 per PCCM > now tolerating TC 24h/d - pulm following Anemia of critical illness - transfuse for Hgb <7. Requiring PRBCs. Per primary. Acute MI - normal coronaries by cath 10/4 Hyperphosphatemia: Receiving continuous feeds so binders of little efficacy. RD optimized feeds.  Phos normalized. S/p robotic incisional hernia repair with mesh and LOA - Surgery following.  Bipolar/schizophrenia Hypernatremia: increased FWF and increased D5 today Dispo:  inpatient for multiple issues still -- if she's able to remain dialysis independent that will really help.   Subjective:    Seen in room.   Asking to move around and to sit on edge of bed.  Good UOP, Na still up   Objective:   BP (!) 141/102 (BP Location: Left Wrist)   Pulse 97   Temp 97.6 F (36.4 C) (Oral)   Resp 14   Ht '5\' 4"'$  (1.626 m)   Wt 93.7 kg   LMP 07/07/2014 (Approximate)   SpO2 100%   BMI 35.46 kg/m   Intake/Output Summary (Last 24 hours) at 05/28/2022 0944 Last data filed at 05/28/2022 0555 Gross per 24 hour  Intake 747.64 ml  Output 1900 ml  Net -1152.36 ml   Weight change:   Physical Exam: Gen:on trach collar, lying in bed CVS: RRR Resp:on TC, clear anteriorly Abd: +BS, soft, NT Ext: Trace edema in the bilateral hands with legs, UNNA boots on legs Neuro: AAO GU: foley with clear yellow urine  Imaging: DG Chest Port 1 View  Result Date: 05/27/2022 CLINICAL DATA:  Aspiration of gastric contents. EXAM: PORTABLE CHEST 1 VIEW COMPARISON:  Chest radiograph 05/20/2022. FINDINGS: Tracheostomy tube tip at the thoracic inlet. Weighted enteric tube in place with tip below the diaphragm not included in the field of view. Stable right internal jugular dialysis catheter. Lung volumes are low. Similar bibasilar atelectasis. No new airspace disease. Stable heart size and mediastinal contours. No pneumothorax or large pleural effusion. No pulmonary edema. IMPRESSION: 1. Low lung volumes with unchanged bibasilar atelectasis. 2. Stable support apparatus. Electronically Signed   By: Keith Rake M.D.   On: 05/27/2022 19:31   DG Abd Portable 1V  Result Date: 05/27/2022 CLINICAL DATA:  Nausea EXAM: PORTABLE ABDOMEN - 1 VIEW COMPARISON:  05/22/2022  FINDINGS: Limited exam secondary to poor penetration related to patient body habitus. Feeding tube is again seen with distal tip projecting near the duodenojejunal junction. Single mildly dilated loop of small bowel in the left hemiabdomen. No gross free intraperitoneal air on supine imaging. IMPRESSION: Limited exam. Single mildly dilated loop of small bowel in the left  hemiabdomen, nonspecific and could reflect a focal ileus or enteritis. Developing obstruction not excluded. Electronically Signed   By: Davina Poke D.O.   On: 05/27/2022 14:32    Labs: BMET Recent Labs  Lab 05/23/22 0027 05/24/22 0032 05/25/22 0020 05/26/22 0026 05/27/22 0019 05/27/22 1227 05/28/22 0024  NA 135 137 142 143 151* 150* 153*  K 4.0 3.8 4.0 4.0 4.0 4.2 4.2  CL 97* 101 108 110 116* 118* 120*  CO2 '27 27 27 25 28 24 27  '$ GLUCOSE 128* 141* 120* 125* 137* 221* 120*  BUN 57* 82* 98* 103* 93* 92* 83*  CREATININE 3.50* 3.80* 3.57* 3.12* 2.65* 2.55* 2.44*  CALCIUM 8.5* 9.2 9.2 8.8* 9.0 9.1 9.1  PHOS 4.4 4.7* 4.8* 4.2 4.1 3.7 4.2   CBC Recent Labs  Lab 05/23/22 0027 05/24/22 0032 05/25/22 0020 05/28/22 0024  WBC 5.9 6.9 6.6 10.3  HGB 7.8* 7.9* 7.6* 8.3*  HCT 24.2* 25.7* 24.6* 26.9*  MCV 85.2 88.0 88.2 91.2  PLT 341 417* 462* 598*    Medications:     Chlorhexidine Gluconate Cloth  6 each Topical Q0600   clonazePAM  0.5 mg Per Tube QHS   copper  2 mg Per Tube Daily   feeding supplement (PROSource TF20)  60 mL Per Tube Daily   fiber  1 packet Per Tube BID   free water  300 mL Per Tube Q4H   heparin injection (subcutaneous)  5,000 Units Subcutaneous Q8H   insulin aspart  0-20 Units Subcutaneous Q4H   insulin aspart  4 Units Subcutaneous Q4H   metoprolol tartrate  50 mg Per Tube BID   multivitamin  1 tablet Per Tube QHS   nutrition supplement (JUVEN)  1 packet Per Tube BID BM   mouth rinse  15 mL Mouth Rinse 4 times per day   pantoprazole  40 mg Per Tube Daily   polyethylene glycol  17 g Per Tube Daily   QUEtiapine  50 mg Per Tube QHS   sodium chloride flush  10-40 mL Intracatheter Q12H   zinc sulfate  220 mg Oral Daily    Madelon Lips MD 05/28/2022, 9:44 AM

## 2022-05-28 NOTE — Progress Notes (Signed)
PROGRESS NOTE    Patricia [name]  DUK:[id number] DOB: Feb 12, 1971 DOA: 04/23/2022 PCP: [name], MD   Brief Narrative:  51 year old with history of DM2, depression, migraine, asthma, schizophrenia, HTN, HLD presented to the hospital for exertional hernia, robotic hernia repair on 04/23/2022 with mesh who developed postop ileus and is on TNA.  Also patient developed vomiting and hypotension after NG tube came out on 04/29/2022 with concerns of pneumonia which was treated with antibiotics and IV fluids.  During that time patient also required Levophed and eventually intubated on 10/3 for worsening metabolic acidosis complicated by acute kidney injury.  Echocardiogram showed depressed EF of 30-35% with elevated troponin.  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  Patient was seen by CHF team started on dopamine and CRRT as well.  Patient was again reintubated due to neuromuscular weakness on 10/8.  Eventually extubated on 10/14 to BiPAP and trach was placed due to weakness.  Tube feeds held due to nausea vomiting on 10/30.  Hospital course complicated by hypernatremia, started D5 water   Assessment & Plan:  Principal Problem:   Septic shock ([place]) Active Problems:   Hernia of abdominal cavity   Aspiration pneumonia (HCC)   Prolonged QT interval   Diabetes mellitus type 2 in obese (HCC)   BIPOLAR DISORDER UNSPECIFIED   Morbid obesity (HCC)   Chronic low back pain   Essential hypertension   Acute systolic heart failure (HCC)   Acute respiratory failure with hypoxia (HCC)   Pressure injury of skin     Assessment and Plan: * Septic shock ([place]) secondary to aspiration pneumonia, right lower lobe Acute respiratory failure with prolonged mechanical ventilation status post tracheostomy Fever overnight -Trach management by pulmonary team. Antibiotics-none; last day was 10/19 - Bronchodilators as needed, supportive care.  Out of bed to chair -We will defer the need  for repeat CT abdomen pelvis to general surgery  Status post hernia repair complicated by postop ileus/inflammatory enteritis Intermittent nausea vomiting -Management by general surgery.  She is working with speech and swallow.  Maintain n.p.o. status, PMV.  Tube feeds  Hypernatremia - Sodium 153, increase D5 water at 100 cc/h.  Repeat BMP at 1 PM.  Free water flushes added  Right knee pain - Unclear etiology, no evidence of trauma.  Will get x-ray.  Voltaren gel ordered  Acute kidney injury, improving -Initially required CRRT and HD.  Now improving without dialysis  Biventricular failure, reduced EF - Last seen by CHF team 10/16.  Previously tried Bumex with minimal to no urine output.  At this point patient is on dialysis and has been well compensated.  Plans to follow-up outpatient. LHC shows clean coronaries.   Sinus tachycardia; improved.  - Now on metoprolol 50 mg twice daily.  IV as needed  Anemia of chronic disease - From underlying multiple medical issues.  Transfuse as necessary and attempt to keep hemoglobin greater than 8  Essential hypertension -Currently on metoprolol twice daily  Chronic low back pain -As needed pain control  Morbid obesity (HCC) -Body mass index is 41.37 kg/m..  Currently on tube feeds -Follow-up outpatient, weight loss diet and exercise  BIPOLAR DISORDER UNSPECIFIED -On Klonopin.  Should slowly resume home meds as appropriate  Diabetes mellitus type 2 in obese (HCC) -Sliding scale and Accu-Cheks.  Pressure area on the lip in the setting of ET tube placement which was not present prior to admission.  Routine wound care.  PT-CIR  DVT prophylaxis: Subcu heparin Code Status: Full code Family Communication:    Final dispo per general surgery  Nutritional status    Signs/Symptoms: NPO status (SBO)  Interventions: Refer to RD note for recommendations, Tube feeding, MVI  Body mass index is 35.46 kg/m.  Pressure Injury  05/22/22 Heel Right Stage 1 -  Intact skin with non-blanchable redness of a localized area usually over a bony prominence. (Active)  05/22/22 1157  Location: Heel  Location Orientation: Right  Staging: Stage 1 -  Intact skin with non-blanchable redness of a localized area usually over a bony prominence.  Wound Description (Comments):   Present on Admission: No     Subjective: Had nausea vomiting yesterday prompting to hold her tube feeds and making her n.p.o.  Also had fever overnight. .  Patient is asking for ice chips and reporting of right knee pain with limited extension and flexion  Examination: Constitutional: Not in acute distress, chronically ill Respiratory: Clear to auscultation bilaterally Cardiovascular: Normal sinus rhythm, no rubs Abdomen: Nontender nondistended good bowel sounds Musculoskeletal: Limb range of motion of right lower extremity at the knee side Skin: No rashes seen Neurologic: CN 2-12 grossly intact.  And nonfocal Psychiatric: Normal judgment and insight. Alert and oriented x 3. Normal mood.  Foley catheter in place Tracheostomy in place Tube feeding in place  Objective: Vitals:   05/28/22 0317 05/28/22 0414 05/28/22 0500 05/28/22 0723  BP:  (!) 147/112  (!) 141/102  Pulse: 99 97  91  Resp: '16 19  14  '$ Temp:  98.4 F (36.9 C)  97.6 F (36.4 C)  TempSrc:  Oral  Oral  SpO2: 100% 100%  98%  Weight:   93.7 kg   Height:        Intake/Output Summary (Last 24 hours) at 05/28/2022 0741 Last data filed at 05/28/2022 0555 Gross per 24 hour  Intake 747.64 ml  Output 1900 ml  Net -1152.36 ml   Filed Weights   05/22/22 2349 05/24/22 0348 05/28/22 0500  Weight: 111.2 kg 91.4 kg 93.7 kg     Data Reviewed:   CBC: Recent Labs  Lab 05/22/22 0805 05/23/22 0027 05/24/22 0032 05/25/22 0020 05/28/22 0024  WBC 5.4 5.9 6.9 6.6 10.3  HGB 7.6* 7.8* 7.9* 7.6* 8.3*  HCT 23.3* 24.2* 25.7* 24.6* 26.9*  MCV 85.0 85.2 88.0 88.2 91.2  PLT 376 341 417*  462* 662*   Basic Metabolic Panel: Recent Labs  Lab 05/24/22 0032 05/25/22 0020 05/26/22 0026 05/27/22 0019 05/27/22 1227 05/28/22 0024  NA 137 142 143 151* 150* 153*  K 3.8 4.0 4.0 4.0 4.2 4.2  CL 101 108 110 116* 118* 120*  CO2 '27 27 25 28 24 27  '$ GLUCOSE 141* 120* 125* 137* 221* 120*  BUN 82* 98* 103* 93* 92* 83*  CREATININE 3.80* 3.57* 3.12* 2.65* 2.55* 2.44*  CALCIUM 9.2 9.2 8.8* 9.0 9.1 9.1  MG 2.4 2.5* 2.5* 2.6*  --  2.5*  PHOS 4.7* 4.8* 4.2 4.1 3.7 4.2   GFR: Estimated Creatinine Clearance: 29.9 mL/min (A) (by C-G formula based on SCr of 2.44 mg/dL (H)). Liver Function Tests: Recent Labs  Lab 05/25/22 0020 05/26/22 0026 05/27/22 0019 05/27/22 1227 05/28/22 0024  ALBUMIN 2.0* 2.0* 1.9* 2.2* 2.1*   No results for input(s): "LIPASE", "AMYLASE" in the last 168 hours. No results for input(s): "AMMONIA" in the last 168 hours. Coagulation Profile: No results for input(s): "INR", "PROTIME" in the last 168 hours. Cardiac Enzymes: No results for input(s): "  CKTOTAL", "CKMB", "CKMBINDEX", "TROPONINI" in the last 168 hours. BNP (last 3 results) No results for input(s): "PROBNP" in the last 8760 hours. HbA1C: No results for input(s): "HGBA1C" in the last 72 hours. CBG: Recent Labs  Lab 05/27/22 1113 05/27/22 1552 05/27/22 1930 05/28/22 0005 05/28/22 0419  GLUCAP 209* 122* 96 115* 114*   Lipid Profile: No results for input(s): "CHOL", "HDL", "LDLCALC", "TRIG", "CHOLHDL", "LDLDIRECT" in the last 72 hours. Thyroid Function Tests: No results for input(s): "TSH", "T4TOTAL", "FREET4", "T3FREE", "THYROIDAB" in the last 72 hours. Anemia Panel: No results for input(s): "VITAMINB12", "FOLATE", "FERRITIN", "TIBC", "IRON", "RETICCTPCT" in the last 72 hours. Sepsis Labs: No results for input(s): "PROCALCITON", "LATICACIDVEN" in the last 168 hours.  No results found for this or any previous visit (from the past 240 hour(s)).       Radiology Studies: DG Chest Port 1  View  Result Date: 05/27/2022 CLINICAL DATA:  Aspiration of gastric contents. EXAM: PORTABLE CHEST 1 VIEW COMPARISON:  Chest radiograph 05/20/2022. FINDINGS: Tracheostomy tube tip at the thoracic inlet. Weighted enteric tube in place with tip below the diaphragm not included in the field of view. Stable right internal jugular dialysis catheter. Lung volumes are low. Similar bibasilar atelectasis. No new airspace disease. Stable heart size and mediastinal contours. No pneumothorax or large pleural effusion. No pulmonary edema. IMPRESSION: 1. Low lung volumes with unchanged bibasilar atelectasis. 2. Stable support apparatus. Electronically Signed   By: Keith Rake M.D.   On: 05/27/2022 19:31   DG Abd Portable 1V  Result Date: 05/27/2022 CLINICAL DATA:  Nausea EXAM: PORTABLE ABDOMEN - 1 VIEW COMPARISON:  05/22/2022 FINDINGS: Limited exam secondary to poor penetration related to patient body habitus. Feeding tube is again seen with distal tip projecting near the duodenojejunal junction. Single mildly dilated loop of small bowel in the left hemiabdomen. No gross free intraperitoneal air on supine imaging. IMPRESSION: Limited exam. Single mildly dilated loop of small bowel in the left hemiabdomen, nonspecific and could reflect a focal ileus or enteritis. Developing obstruction not excluded. Electronically Signed   By: Davina Poke D.O.   On: 05/27/2022 14:32        Scheduled Meds:  Chlorhexidine Gluconate Cloth  6 each Topical Q0600   clonazePAM  0.5 mg Per Tube QHS   copper  2 mg Per Tube Daily   feeding supplement (PROSource TF20)  60 mL Per Tube Daily   fiber  1 packet Per Tube BID   free water  200 mL Per Tube Q4H   heparin injection (subcutaneous)  5,000 Units Subcutaneous Q8H   insulin aspart  0-20 Units Subcutaneous Q4H   insulin aspart  4 Units Subcutaneous Q4H   metoprolol tartrate  50 mg Per Tube BID   multivitamin  1 tablet Per Tube QHS   nutrition supplement (JUVEN)  1 packet  Per Tube BID BM   mouth rinse  15 mL Mouth Rinse 4 times per day   pantoprazole  40 mg Per Tube Daily   polyethylene glycol  17 g Per Tube Daily   QUEtiapine  50 mg Per Tube QHS   sodium chloride flush  10-40 mL Intracatheter Q12H   zinc sulfate  220 mg Oral Daily   Continuous Infusions:  sodium chloride 10 mL/hr at 05/20/22 1900   albumin human     dextrose     feeding supplement (PEPTAMEN 1.5 CAL) Stopped (05/27/22 1755)     LOS: 34 days   Time spent= 35 mins  Lynea Rollison Arsenio Loader, MD [facility]  If 7PM-7AM, please contact night-coverage  05/28/2022, 7:41 AM

## 2022-05-29 ENCOUNTER — Inpatient Hospital Stay (HOSPITAL_COMMUNITY): Payer: Medicaid Other

## 2022-05-29 DIAGNOSIS — I10 Essential (primary) hypertension: Secondary | ICD-10-CM | POA: Diagnosis not present

## 2022-05-29 DIAGNOSIS — A419 Sepsis, unspecified organism: Secondary | ICD-10-CM | POA: Diagnosis not present

## 2022-05-29 DIAGNOSIS — J9601 Acute respiratory failure with hypoxia: Secondary | ICD-10-CM | POA: Diagnosis not present

## 2022-05-29 DIAGNOSIS — J690 Pneumonitis due to inhalation of food and vomit: Secondary | ICD-10-CM | POA: Diagnosis not present

## 2022-05-29 HISTORY — PX: IR REMOVAL TUN CV CATH W/O FL: IMG2289

## 2022-05-29 LAB — CBC
HCT: 30.7 % — ABNORMAL LOW (ref 36.0–46.0)
Hemoglobin: 9.2 g/dL — ABNORMAL LOW (ref 12.0–15.0)
MCH: 27.6 pg (ref 26.0–34.0)
MCHC: 30 g/dL (ref 30.0–36.0)
MCV: 92.2 fL (ref 80.0–100.0)
Platelets: 605 10*3/uL — ABNORMAL HIGH (ref 150–400)
RBC: 3.33 MIL/uL — ABNORMAL LOW (ref 3.87–5.11)
RDW: 16.9 % — ABNORMAL HIGH (ref 11.5–15.5)
WBC: 15.9 10*3/uL — ABNORMAL HIGH (ref 4.0–10.5)
nRBC: 0 % (ref 0.0–0.2)

## 2022-05-29 LAB — RENAL FUNCTION PANEL
Albumin: 2.4 g/dL — ABNORMAL LOW (ref 3.5–5.0)
Anion gap: 14 (ref 5–15)
BUN: 56 mg/dL — ABNORMAL HIGH (ref 6–20)
CO2: 22 mmol/L (ref 22–32)
Calcium: 9.3 mg/dL (ref 8.9–10.3)
Chloride: 114 mmol/L — ABNORMAL HIGH (ref 98–111)
Creatinine, Ser: 1.98 mg/dL — ABNORMAL HIGH (ref 0.44–1.00)
GFR, Estimated: 30 mL/min — ABNORMAL LOW (ref >60)
Glucose, Bld: 114 mg/dL — ABNORMAL HIGH (ref 70–99)
Phosphorus: 4.9 mg/dL — ABNORMAL HIGH (ref 2.5–4.6)
Potassium: 5.3 mmol/L — ABNORMAL HIGH (ref 3.5–5.1)
Sodium: 150 mmol/L — ABNORMAL HIGH (ref 135–145)

## 2022-05-29 LAB — GLUCOSE, CAPILLARY
Glucose-Capillary: 104 mg/dL — ABNORMAL HIGH (ref 70–99)
Glucose-Capillary: 128 mg/dL — ABNORMAL HIGH (ref 70–99)
Glucose-Capillary: 84 mg/dL (ref 70–99)
Glucose-Capillary: 107 mg/dL — ABNORMAL HIGH (ref 70–99)

## 2022-05-29 LAB — HEPATIC FUNCTION PANEL
ALT: 197 U/L — ABNORMAL HIGH (ref 0–44)
AST: 113 U/L — ABNORMAL HIGH (ref 15–41)
Albumin: 2.4 g/dL — ABNORMAL LOW (ref 3.5–5.0)
Alkaline Phosphatase: 115 U/L (ref 38–126)
Bilirubin, Direct: 0.2 mg/dL (ref 0.0–0.2)
Indirect Bilirubin: 0.2 mg/dL — ABNORMAL LOW (ref 0.3–0.9)
Total Bilirubin: 0.4 mg/dL (ref 0.3–1.2)
Total Protein: 8.8 g/dL — ABNORMAL HIGH (ref 6.5–8.1)

## 2022-05-29 LAB — MAGNESIUM: Magnesium: 2.1 mg/dL (ref 1.7–2.4)

## 2022-05-29 MED ORDER — SODIUM CHLORIDE 0.9 % IV SOLN: INTRAVENOUS | Status: DC

## 2022-05-29 MED ORDER — INSULIN ASPART 100 UNIT/ML IJ SOLN: 4.0000 [IU] | Freq: Four times a day (QID) | INTRAMUSCULAR | Status: DC

## 2022-05-29 MED ORDER — METHOCARBAMOL 1000 MG/10ML IJ SOLN
500.0000 mg | Freq: Three times a day (TID) | INTRAVENOUS | Status: AC | PRN
  Administered 2022-06-01 – 2022-06-02 (×2): 500 mg via INTRAVENOUS
  Filled 2022-05-29 (×2): qty 5

## 2022-05-29 MED ORDER — NITROGLYCERIN 0.4 MG SL SUBL
0.4000 mg | SUBLINGUAL_TABLET | SUBLINGUAL | Status: AC | PRN
  Administered 2022-06-07 – 2022-06-10 (×3): 0.4 mg via SUBLINGUAL
  Filled 2022-05-29: qty 1

## 2022-05-29 MED ORDER — HYDROMORPHONE HCL 1 MG/ML IJ SOLN
0.5000 mg | Freq: Once | INTRAMUSCULAR | Status: AC
  Administered 2022-05-29: 0.5 mg via INTRAVENOUS
  Filled 2022-05-29: qty 0.5

## 2022-05-29 MED ORDER — HYDRALAZINE HCL 20 MG/ML IJ SOLN: 5.0000 mg | INTRAMUSCULAR | Status: DC | PRN

## 2022-05-29 MED ORDER — DEXTROSE 5 % IV SOLN: INTRAVENOUS | Status: AC

## 2022-05-29 MED ORDER — SODIUM ZIRCONIUM CYCLOSILICATE 10 G PO PACK: 10.0000 g | PACK | Freq: Once | ORAL | Status: DC

## 2022-05-29 MED ORDER — INSULIN ASPART 100 UNIT/ML IJ SOLN
4.0000 [IU] | Freq: Four times a day (QID) | INTRAMUSCULAR | Status: DC
  Administered 2022-05-30 – 2022-06-21 (×43): 4 [IU] via SUBCUTANEOUS

## 2022-05-29 MED ORDER — PROCHLORPERAZINE EDISYLATE 10 MG/2ML IJ SOLN
5.0000 mg | Freq: Once | INTRAMUSCULAR | Status: AC | PRN
  Administered 2022-05-29: 5 mg via INTRAVENOUS
  Filled 2022-05-29: qty 1

## 2022-05-29 MED ORDER — PANTOPRAZOLE SODIUM 40 MG IV SOLR
40.0000 mg | Freq: Every day | INTRAVENOUS | Status: DC
  Administered 2022-05-29 – 2022-06-06 (×9): 40 mg via INTRAVENOUS
  Filled 2022-05-29 (×9): qty 10

## 2022-05-29 NOTE — Progress Notes (Signed)
Unna boots removed and skin cleansed per patient request

## 2022-05-29 NOTE — Plan of Care (Signed)
  Problem: Education: Goal: Knowledge of General Education information will improve Description: Including pain rating scale, medication(s)/side effects and non-pharmacologic comfort measures Outcome: Progressing   Problem: Health Behavior/Discharge Planning: Goal: Ability to manage health-related needs will improve Outcome: Progressing   Problem: Clinical Measurements: Goal: Ability to maintain clinical measurements within normal limits will improve Outcome: Progressing Goal: Will remain free from infection Outcome: Progressing Goal: Diagnostic test results will improve Outcome: Progressing Goal: Respiratory complications will improve Outcome: Progressing Goal: Cardiovascular complication will be avoided Outcome: Progressing   Problem: Activity: Goal: Risk for activity intolerance will decrease Outcome: Progressing   Problem: Nutrition: Goal: Adequate nutrition will be maintained Outcome: Progressing   Problem: Coping: Goal: Level of anxiety will decrease Outcome: Progressing   Problem: Elimination: Goal: Will not experience complications related to bowel motility Outcome: Progressing Goal: Will not experience complications related to urinary retention Outcome: Progressing   Problem: Pain Managment: Goal: General experience of comfort will improve Outcome: Progressing   Problem: Safety: Goal: Ability to remain free from injury will improve Outcome: Progressing   Problem: Skin Integrity: Goal: Risk for impaired skin integrity will decrease Outcome: Progressing   Problem: Fluid Volume: Goal: Hemodynamic stability will improve Outcome: Progressing   Problem: Clinical Measurements: Goal: Diagnostic test results will improve Outcome: Progressing Goal: Signs and symptoms of infection will decrease Outcome: Progressing   Problem: Respiratory: Goal: Ability to maintain adequate ventilation will improve Outcome: Progressing   Problem: Education: Goal: Ability  to describe self-care measures that may prevent or decrease complications (Diabetes Survival Skills Education) will improve Outcome: Progressing Goal: Individualized Educational Video(s) Outcome: Progressing   Problem: Coping: Goal: Ability to adjust to condition or change in health will improve Outcome: Progressing   Problem: Fluid Volume: Goal: Ability to maintain a balanced intake and output will improve Outcome: Progressing   Problem: Health Behavior/Discharge Planning: Goal: Ability to identify and utilize available resources and services will improve Outcome: Progressing Goal: Ability to manage health-related needs will improve Outcome: Progressing   Problem: Metabolic: Goal: Ability to maintain appropriate glucose levels will improve Outcome: Progressing   Problem: Nutritional: Goal: Maintenance of adequate nutrition will improve Outcome: Progressing Goal: Progress toward achieving an optimal weight will improve Outcome: Progressing   Problem: Skin Integrity: Goal: Risk for impaired skin integrity will decrease Outcome: Progressing   Problem: Tissue Perfusion: Goal: Adequacy of tissue perfusion will improve Outcome: Progressing   Problem: Activity: Goal: Ability to tolerate increased activity will improve Outcome: Progressing   Problem: Respiratory: Goal: Ability to maintain a clear airway and adequate ventilation will improve Outcome: Progressing   Problem: Role Relationship: Goal: Method of communication will improve Outcome: Progressing   Problem: Education: Goal: Understanding of CV disease, CV risk reduction, and recovery process will improve Outcome: Progressing Goal: Individualized Educational Video(s) Outcome: Progressing   Problem: Activity: Goal: Ability to return to baseline activity level will improve Outcome: Progressing   Problem: Cardiovascular: Goal: Ability to achieve and maintain adequate cardiovascular perfusion will  improve Outcome: Progressing Goal: Vascular access site(s) Level 0-1 will be maintained Outcome: Progressing   Problem: Health Behavior/Discharge Planning: Goal: Ability to safely manage health-related needs after discharge will improve Outcome: Progressing   Problem: Education: Goal: 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 Outcome: Progressing   Problem: Activity: Goal: Ability to tolerate increased activity will improve Outcome: Progressing   Problem: Health Behavior/Discharge Planning: Goal: Ability to manage 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 Outcome: Progressing   Problem: Respiratory: Goal: Patent airway maintenance will improve Outcome: Progressing

## 2022-05-29 NOTE — Progress Notes (Signed)
28 Days Post-Op   Subjective/Chief Complaint: Still RUQ pain, labs pending    Objective: Vital signs in last 24 hours: Temp:  [97.6 F (36.4 C)-99.6 F (37.6 C)] 99.2 F (37.3 C) (11/01 0348) Pulse Rate:  [91-113] 113 (11/01 0530) Resp:  [14-24] 24 (11/01 0530) BP: (141-150)/(97-108) 150/97 (11/01 0348) SpO2:  [87 %-100 %] 96 % (11/01 0530) FiO2 (%):  [28 %-35 %] 35 % (11/01 0530) Last BM Date : 05/28/22  Intake/Output from previous day: 10/31 0701 - 11/01 0700 In: 1473.8 [I.V.:1443.8; NG/GT:30] Out: 2250 [Urine:2250] Intake/Output this shift: No intake/output data recorded.  Trach in place Abd - soft, RUQ mild tenderness  Lab Results:  Recent Labs    05/28/22 0024  WBC 10.3  HGB 8.3*  HCT 26.9*  PLT 598*    BMET Recent Labs    05/28/22 0024 05/28/22 1813  NA 153* 149*  K 4.2 4.8  CL 120* 117*  CO2 27 24  GLUCOSE 120* 123*  BUN 83* 65*  CREATININE 2.44* 2.11*  CALCIUM 9.1 9.1    PT/INR No results for input(s): "LABPROT", "INR" in the last 72 hours. ABG No results for input(s): "PHART", "HCO3" in the last 72 hours.  Invalid input(s): "PCO2", "PO2"  Studies/Results: DG Knee Right Port  Result Date: 05/28/2022 CLINICAL DATA:  Knee pain.  Septic shock EXAM: PORTABLE RIGHT KNEE - 1-2 VIEW COMPARISON:  None Available. FINDINGS: No evidence of fracture, dislocation, or joint effusion. No evidence of arthropathy or other focal bone abnormality. Soft tissues are unremarkable. IMPRESSION: Negative. Electronically Signed   By: Franchot Gallo M.D.   On: 05/28/2022 11:03   DG Chest Port 1 View  Result Date: 05/27/2022 CLINICAL DATA:  Aspiration of gastric contents. EXAM: PORTABLE CHEST 1 VIEW COMPARISON:  Chest radiograph 05/20/2022. FINDINGS: Tracheostomy tube tip at the thoracic inlet. Weighted enteric tube in place with tip below the diaphragm not included in the field of view. Stable right internal jugular dialysis catheter. Lung volumes are low. Similar  bibasilar atelectasis. No new airspace disease. Stable heart size and mediastinal contours. No pneumothorax or large pleural effusion. No pulmonary edema. IMPRESSION: 1. Low lung volumes with unchanged bibasilar atelectasis. 2. Stable support apparatus. Electronically Signed   By: Keith Rake M.D.   On: 05/27/2022 19:31   DG Abd Portable 1V  Result Date: 05/27/2022 CLINICAL DATA:  Nausea EXAM: PORTABLE ABDOMEN - 1 VIEW COMPARISON:  05/22/2022 FINDINGS: Limited exam secondary to poor penetration related to patient body habitus. Feeding tube is again seen with distal tip projecting near the duodenojejunal junction. Single mildly dilated loop of small bowel in the left hemiabdomen. No gross free intraperitoneal air on supine imaging. IMPRESSION: Limited exam. Single mildly dilated loop of small bowel in the left hemiabdomen, nonspecific and could reflect a focal ileus or enteritis. Developing obstruction not excluded. Electronically Signed   By: Davina Poke D.O.   On: 05/27/2022 14:32    Anti-infectives: Anti-infectives (From admission, onward)    Start     Dose/Rate Route Frequency Ordered Stop   05/15/22 1445  ceFAZolin (ANCEF) IVPB 2g/100 mL premix       Note to Pharmacy: To be given in IR   2 g 200 mL/hr over 30 Minutes Intravenous  Once 05/15/22 1352 05/15/22 1616   05/15/22 1230  ceFAZolin (ANCEF) powder 1 g  Status:  Discontinued        1 g Other To Surgery 05/15/22 1140 05/15/22 1239   05/15/22 1230  ceFAZolin (ANCEF)  IVPB 2g/100 mL premix        2 g 200 mL/hr over 30 Minutes Intravenous To Radiology 05/15/22 1140 05/15/22 1140   05/15/22 0000  ceFAZolin (ANCEF) IVPB 2g/100 mL premix        2 g 200 mL/hr over 30 Minutes Intravenous To Radiology 05/14/22 1308 05/15/22 0005   05/13/22 2134  meropenem (MERREM) 500 mg in sodium chloride 0.9 % 100 mL IVPB        500 mg 200 mL/hr over 30 Minutes Intravenous Every 24 hours 05/13/22 0815 05/17/22 0700   05/13/22 1000  meropenem  (MERREM) 500 mg in sodium chloride 0.9 % 100 mL IVPB  Status:  Discontinued        500 mg 200 mL/hr over 30 Minutes Intravenous Every 12 hours 05/12/22 0940 05/12/22 0946   05/12/22 2200  meropenem (MERREM) 500 mg in sodium chloride 0.9 % 100 mL IVPB  Status:  Discontinued        500 mg 200 mL/hr over 30 Minutes Intravenous Every 12 hours 05/12/22 0946 05/13/22 0815   05/12/22 2000  meropenem (MERREM) 1 g in sodium chloride 0.9 % 100 mL IVPB  Status:  Discontinued        1 g 200 mL/hr over 30 Minutes Intravenous  Once 05/12/22 0940 05/12/22 0946   05/06/22 1530  meropenem (MERREM) 1 g in sodium chloride 0.9 % 100 mL IVPB  Status:  Discontinued        1 g 200 mL/hr over 30 Minutes Intravenous Every 8 hours 05/06/22 1438 05/12/22 0940   05/04/22 1000  vancomycin (VANCOCIN) IVPB 1000 mg/200 mL premix  Status:  Discontinued        1,000 mg 200 mL/hr over 60 Minutes Intravenous Every 24 hours 05/03/22 0802 05/08/22 0913   05/03/22 0845  vancomycin (VANCOREADY) IVPB 2000 mg/400 mL        2,000 mg 200 mL/hr over 120 Minutes Intravenous  Once 05/03/22 0751 05/03/22 1114   05/01/22 1800  piperacillin-tazobactam (ZOSYN) IVPB 3.375 g  Status:  Discontinued        3.375 g 12.5 mL/hr over 240 Minutes Intravenous Every 6 hours 05/01/22 1426 05/01/22 1427   05/01/22 1515  piperacillin-tazobactam (ZOSYN) IVPB 3.375 g  Status:  Discontinued        3.375 g 100 mL/hr over 30 Minutes Intravenous Every 6 hours 05/01/22 1428 05/06/22 1438   04/30/22 1400  piperacillin-tazobactam (ZOSYN) IVPB 2.25 g  Status:  Discontinued        2.25 g 100 mL/hr over 30 Minutes Intravenous Every 8 hours 04/30/22 1325 05/01/22 1426   04/29/22 1530  piperacillin-tazobactam (ZOSYN) IVPB 3.375 g  Status:  Discontinued        3.375 g 12.5 mL/hr over 240 Minutes Intravenous Every 8 hours 04/29/22 1433 04/30/22 1325   04/29/22 1145  metroNIDAZOLE (FLAGYL) IVPB 500 mg  Status:  Discontinued        500 mg 100 mL/hr over 60 Minutes  Intravenous Every 12 hours 04/29/22 1047 04/29/22 1433   04/29/22 1145  vancomycin (VANCOCIN) IVPB 1000 mg/200 mL premix  Status:  Discontinued        1,000 mg 200 mL/hr over 60 Minutes Intravenous  Once 04/29/22 1047 04/29/22 1055   04/29/22 1145  vancomycin (VANCOREADY) IVPB 1500 mg/300 mL  Status:  Discontinued        1,500 mg 150 mL/hr over 120 Minutes Intravenous Every 24 hours 04/29/22 1055 04/29/22 1357   04/29/22 1045  ceFEPIme (MAXIPIME)  2 g in sodium chloride 0.9 % 100 mL IVPB  Status:  Discontinued        2 g 200 mL/hr over 30 Minutes Intravenous Every 8 hours 04/29/22 0954 04/29/22 1433   04/23/22 0730  ceFAZolin (ANCEF) IVPB 2g/100 mL premix        2 g 200 mL/hr over 30 Minutes Intravenous On call to O.R. 04/23/22 0726 04/23/22 4765       Assessment/Plan: Ms. Nghiem is s/p robotic incisional hernia repair on 04/23/22 -Postop ileus complicated by aspiration: Ileus resolved, having bowel function though unreliable.  Will schedule miralax daily as it seems she may be getting constipated - Inflammatory enteritis likely due to ischemic insult with dilated bowel at time of high pressor support - small bowel dilation on x-ray improved - Abdomen with some RUQ tenderness today - Ulcers less likely as patient has been on protonix, bowel blockage or issue unlikely as patient having bowel movements, could have cholecystitis but no stones on ultrasound, WBC only 10.  Morning labs pending - Encouraged her not to refuse medications. - Canceled 300 mL free water boluses as this may be too much at once for her small duodenal feeding tube - TF as tolerated, okay for speech eval for diet as tolerated   Acute metabolic encephalopathy superimposed on hx bipolar disorder, schizophrenia   ICU delirium- improved now off all IV infusions. Acute biventricular HFrEF (Etiology sepsis, stress induced CM. Has clean coronaries) Cardiogenic shock resolved, EF improved.  Acute hypoxic respiratory  failure Aspiration PNA Pulm edema   RLL VAP  AKI with oliguria; no baseline CKD   - appears to have some renal recovery Anemia, acute on chronic  DM2 with hyperglycemia  Severe protein calorie malnutrition  Pressure injury lip - improving  Deconditioning    Appreciate [facility] care with multiple issues   Needs therapy and recouperation     Dispo - Remain in stepdown for trach care, case Management consult for placement - difficult with many needs, insurance limitation.  Would be great if patient is accepted to inpatient rehab.    LOS: 35 days    Patricia [name] 05/29/2022

## 2022-05-29 NOTE — Progress Notes (Signed)
WBC elevated today.  Will check CT abdomen/pelvis with enteral contrast via slow infusion through nasoduodenal tube.  No IV contrast due to kidney issues.

## 2022-05-29 NOTE — Progress Notes (Signed)
Pt called out and asked to be put in the chair. Pt was informed that as soon as the NT was available we would get her up in the chair. Pt continues to call out.

## 2022-05-29 NOTE — Progress Notes (Signed)
Speech Language Pathology Treatment: Dysphagia;Passy Muir Speaking valve  Patient Details Name: Patricia [name] MRN: [id number] DOB: [birth year] Today's Date: 05/29/2022 Time: 1040-1105 SLP Time Calculation (min) (ACUTE ONLY): 25 min  Assessment / Plan / Recommendation Clinical Impression  Patient seen by SLP for skilled treatment focused on PMV usage/toleration and PO trials. Patient was awake, alert sitting up in recliner chair with trach cuff deflated and PMV in place when SLP arrived. SpO2 remained steady at 97% and RR at 17. No changes in either during or after PO intake. Patient achieving adequate voicing with some congestion observed. She does need reminders to verbally communicate as she will typically gesture instead. SLP performed oral care as patient had some retained secretions. Trach care had been completed already by RN and trach was clean when SLP inspected. Patient was happy to have some ice chips with SLP giving her single ice chip, one at a time by spoon. Mastication appeared [facility] but pharyngeal swallow with suspected delays. After initial two ice chips, patient exhibited mildly delayed cough response with one of those coughs being productive with secretions expectorated both orally and tracheally. After coughing, patient asked "what was that?" And SLP explained likely presence of secretions in pharynx. When cued to cough, cough was weak and no ability to expectorate. Patient was receptive to trying more ice chips and with approximately 10 ice chips trialed, no further overt s/s aspiration or penetration and no s/s nausea, retching or vomiting. SLP recommending continued NPO status but allow for PRN ice chips when patient has PMV in place and after oral care completed. Patient had requested a soda and SLP then explained need for MBS to assess swallow function. Patient in agreement with this. SLP will plan to complete MBS in next 1-2 dates.    HPI HPI: Patricia [name] is a 51 y.o. female who  presented to hospital for excisional hernia repair (robotic hernia repair 04/23/22) with mesh, ileus post op on TNA. Developed vomiting and hypotension after NG came out on 04/29/22 and PNA on CXR, was tachycardic and BP improved with IV fluids. DX with sepsis and ABX started. Pt admitted to ICU on 10/2.  Was placed on levophed. Early AM on 10/3 was intubated for worsening metabolic/lactic acidosis. 10/4 cardiac cath with EF 15%; She had AKI as well. Started CRRT. Extubation 10/14 failed; underwent tracheostomy. TC trials started 10/16. Cleared by surgery to attempt POs 10/27.      SLP Plan  Continue with current plan of care;MBS      Recommendations for follow up therapy are one component of a multi-disciplinary discharge planning process, led by the attending physician.  Recommendations may be updated based on patient status, additional functional criteria and insurance authorization.    Recommendations  Diet recommendations: NPO;Other(comment) (PRN ice chips) Medication Administration: Via alternative means Supervision: Full supervision/cueing for compensatory strategies Compensations: Slow rate;Small sips/bites Postural Changes and/or Swallow Maneuvers: Seated upright 90 degrees      Patient may use Passy-Muir Speech Valve: During PO intake/meals;During all waking hours (remove during sleep);Intermittently with supervision PMSV Supervision: Full MD: Please consider changing trach tube to : Cuffless         Oral Care Recommendations: Oral care QID Follow Up Recommendations: Acute inpatient rehab (3hours/day) Assistance recommended at discharge: PRN SLP Visit Diagnosis: Aphonia (R49.1);Dysphagia, unspecified (R13.10) Plan: Continue with current plan of care;MBS           Sonia Baller, MA, CCC-SLP Speech Therapy

## 2022-05-29 NOTE — Progress Notes (Signed)
Patient refusing tube feedings at this time d/t abdominal pain.  Dr. [place] Crane made aware via text page, no response.

## 2022-05-29 NOTE — Progress Notes (Signed)
Mobility Specialist Progress Note    05/29/22 1009  Mobility  Activity Transferred from bed to chair  Level of Assistance +2 (takes two people)  Assistive Device MaxiMove  Activity Response Tolerated well  Mobility Referral Yes  $Mobility charge 1 Mobility   Pre-Mobility: 112 HR, 97% SpO2 Post-Mobility: 115 HR, 93% SpO2  Pt received and agreeable. C/o bloated stomach pain. Left with talking valve on. RN notified.   Hildred Alamin Mobility Specialist  Secure Chat Only

## 2022-05-29 NOTE — Procedures (Signed)
PROCEDURE SUMMARY:  Successful removal of tunneled hemodialysis catheter.  Patient tolerated well.  EBL < 5 mL  See full dictation in Imaging for details.  Luverne Zerkle S Ausencio Vaden PA-C 05/29/2022 2:36 PM

## 2022-05-29 NOTE — Progress Notes (Signed)
SLP Cancellation Note  Patient Details Name: Patricia [name] MRN: [id number] DOB: Dec 06, 1970   Cancelled treatment:       Reason Eval/Treat Not Completed: Patient declined, no reason specified. SLP and RN entered room and patient indicating she wanted to get up in her recliner. RN informed her she needs to have assistance from NT for using the lift and patient appeared frustrated by this. When asked if patient would work with SLP and then get in chair a little later, patient refused gesturing that she wanted to get in the chair immediately. SLP did check her cuff which was fully inflated and so contacted RT who reported patient's cuff could be deflated. (Had been inflated due to recent vomiting episodes). SLP will return to see patient later this date.   Sonia Baller, MA, CCC-SLP Speech Therapy

## 2022-05-29 NOTE — Progress Notes (Signed)
Mobility Specialist Progress Note    05/29/22 1256  Mobility  Activity Transferred from chair to bed  Level of Assistance +2 (takes two people)  Assistive Device MaxiMove  Activity Response Tolerated well  Mobility Referral Yes  $Mobility charge 1 Mobility   No complaints. Left with RN present.   Hildred Alamin Mobility Specialist  Secure Chat Only

## 2022-05-29 NOTE — Progress Notes (Signed)
[facility] Progress Note   Assessment/ Plan:   AKI - likely ATN in the setting of cardiogenic and septic shock. CRRT 10/4-10/14/23 via RIJ temp HD catheter. First IHD 10/16.  Looks to be in early phase of renal recovery - UOP really up over past 4-5 days. Looks like post ATN diuresis + catabolic state.   Recovered renal function- remove TDC Maintain foley for now - was having some retention - when more mobile/up do TOV Avoid nephrotoxic medications including NSAIDs and iodinated intravenous contrast exposure unless the latter is absolutely indicated.  Preferred narcotic agents for pain control are hydromorphone, fentanyl, and methadone. Morphine should not be used. Avoid Baclofen and avoid oral sodium phosphate and magnesium citrate based laxatives / bowel preps. Continue strict Input and Output monitoring. Will monitor the patient closely with you and intervene or adjust therapy as indicated by changes in clinical status/labs   Cardiogenic/septic shock - off pressors now.  Heart failure team had been following. Likely due to aspiration pneumonia. Acute hypoxic respiratory failure - was re-intubated 05/05/22.  Extubated again on 10/14 but failed BiPAP, s/p trach and back on Vent 05/11/22 per PCCM > now tolerating TC 24h/d - pulm following Anemia of critical illness - transfuse for Hgb <7. Requiring PRBCs. Per primary. Acute MI - normal coronaries by cath 10/4 Hyperphosphatemia: Receiving continuous feeds so binders of little efficacy. RD optimized feeds.  Phos normalized. S/p robotic incisional hernia repair with mesh and LOA - Surgery following.  Bipolar/schizophrenia Hypernatremia: FWF stopped d/t abd discomfort.  On D5.  If acceptable volume of FWF, would prefer enteral route for correcting hypernatremia Abd pain: getting laxatives per surg Dispo:  inpatient for multiple issues still -- if she's able to remain dialysis independent that will really help.   Subjective:    Seen  in room.  Asking to be suctioned.     Objective:   BP (!) 135/107 (BP Location: Left Wrist)   Pulse (!) 112   Temp 99.2 F (37.3 C) (Axillary)   Resp 20   Ht '5\' 4"'$  (1.626 m)   Wt 93.7 kg   LMP 07/07/2014 (Approximate)   SpO2 99%   BMI 35.46 kg/m   Intake/Output Summary (Last 24 hours) at 05/29/2022 0946 Last data filed at 05/29/2022 0500 Gross per 24 hour  Intake 1473.78 ml  Output 2250 ml  Net -776.22 ml   Weight change:   Physical Exam: Gen:on trach collar, lying in bed CVS: RRR Resp:on TC, clear anteriorly Abd: +BS, soft, mild tenderness Ext: Trace edema in the bilateral hands with legs, UNNA boots on legs Neuro: AAO GU: foley with clear yellow urine  Imaging: DG Knee Right Port  Result Date: 05/28/2022 CLINICAL DATA:  Knee pain.  Septic shock EXAM: PORTABLE RIGHT KNEE - 1-2 VIEW COMPARISON:  None Available. FINDINGS: No evidence of fracture, dislocation, or joint effusion. No evidence of arthropathy or other focal bone abnormality. Soft tissues are unremarkable. IMPRESSION: Negative. Electronically Signed   By: Franchot Gallo M.D.   On: 05/28/2022 11:03   DG Chest Port 1 View  Result Date: 05/27/2022 CLINICAL DATA:  Aspiration of gastric contents. EXAM: PORTABLE CHEST 1 VIEW COMPARISON:  Chest radiograph 05/20/2022. FINDINGS: Tracheostomy tube tip at the thoracic inlet. Weighted enteric tube in place with tip below the diaphragm not included in the field of view. Stable right internal jugular dialysis catheter. Lung volumes are low. Similar bibasilar atelectasis. No new airspace disease. Stable heart size and mediastinal contours. No pneumothorax or  large pleural effusion. No pulmonary edema. IMPRESSION: 1. Low lung volumes with unchanged bibasilar atelectasis. 2. Stable support apparatus. Electronically Signed   By: Keith Rake M.D.   On: 05/27/2022 19:31   DG Abd Portable 1V  Result Date: 05/27/2022 CLINICAL DATA:  Nausea EXAM: PORTABLE ABDOMEN - 1 VIEW  COMPARISON:  05/22/2022 FINDINGS: Limited exam secondary to poor penetration related to patient body habitus. Feeding tube is again seen with distal tip projecting near the duodenojejunal junction. Single mildly dilated loop of small bowel in the left hemiabdomen. No gross free intraperitoneal air on supine imaging. IMPRESSION: Limited exam. Single mildly dilated loop of small bowel in the left hemiabdomen, nonspecific and could reflect a focal ileus or enteritis. Developing obstruction not excluded. Electronically Signed   By: Davina Poke D.O.   On: 05/27/2022 14:32    Labs: BMET Recent Labs  Lab 05/24/22 0032 05/25/22 0020 05/26/22 0026 05/27/22 0019 05/27/22 1227 05/28/22 0024 05/28/22 1813 05/29/22 0644  NA 137 142 143 151* 150* 153* 149* 150*  K 3.8 4.0 4.0 4.0 4.2 4.2 4.8 5.3*  CL 101 108 110 116* 118* 120* 117* 114*  CO2 '27 27 25 28 24 27 24 22  '$ GLUCOSE 141* 120* 125* 137* 221* 120* 123* 114*  BUN 82* 98* 103* 93* 92* 83* 65* 56*  CREATININE 3.80* 3.57* 3.12* 2.65* 2.55* 2.44* 2.11* 1.98*  CALCIUM 9.2 9.2 8.8* 9.0 9.1 9.1 9.1 9.3  PHOS 4.7* 4.8* 4.2 4.1 3.7 4.2  --  4.9*   CBC Recent Labs  Lab 05/24/22 0032 05/25/22 0020 05/28/22 0024 05/29/22 0644  WBC 6.9 6.6 10.3 15.9*  HGB 7.9* 7.6* 8.3* 9.2*  HCT 25.7* 24.6* 26.9* 30.7*  MCV 88.0 88.2 91.2 92.2  PLT 417* 462* 598* 605*    Medications:     Chlorhexidine Gluconate Cloth  6 each Topical Q0600   clonazePAM  0.5 mg Per Tube QHS   copper  2 mg Per Tube Daily   diclofenac Sodium  2 g Topical QID   famotidine  10 mg Per Tube Daily   feeding supplement (PROSource TF20)  60 mL Per Tube Daily   fiber  1 packet Per Tube BID   heparin injection (subcutaneous)  5,000 Units Subcutaneous Q8H   insulin aspart  0-20 Units Subcutaneous Q6H   insulin aspart  4 Units Subcutaneous Q6H   metoprolol tartrate  50 mg Per Tube BID   multivitamin  1 tablet Per Tube QHS   nutrition supplement (JUVEN)  1 packet Per Tube BID BM    mouth rinse  15 mL Mouth Rinse 4 times per day   polyethylene glycol  17 g Per Tube Daily   QUEtiapine  50 mg Per Tube QHS   sodium chloride flush  10-40 mL Intracatheter Q12H   sodium zirconium cyclosilicate  10 g Per Tube Once   zinc sulfate  220 mg Oral Daily    Madelon Lips MD 05/29/2022, 9:46 AM

## 2022-05-29 NOTE — Progress Notes (Signed)
PROGRESS NOTE    Patricia [name]  IWO:[id number] DOB: [birth year] DOA: 04/23/2022 PCP: [name], MD   Brief Narrative:   51 year old with history of DM2, depression, migraine, asthma, schizophrenia, HTN, HLD presented to the hospital for exertional hernia, robotic hernia repair on 04/23/2022 with mesh who developed postop ileus and is on TNA.  Also patient developed vomiting and hypotension after NG tube came out on 04/29/2022 with concerns of pneumonia which was treated with antibiotics and IV fluids.  During that time patient also required Levophed and eventually intubated on 10/3 for worsening metabolic acidosis complicated by acute kidney injury.  Echocardiogram showed depressed EF of 30-35% with elevated troponin.  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  Patient was seen by CHF team started on dopamine and CRRT as well.  Patient was again reintubated due to neuromuscular weakness on 10/8.  Eventually extubated on 10/14 to BiPAP and trach was placed due to weakness.  Tube feeds held due to nausea vomiting on 10/30.  Hospital course complicated by hypernatremia, started D5 water   Assessment & Plan:  Principal Problem:   Septic shock ([place]) Active Problems:   Hernia of abdominal cavity   Aspiration pneumonia (HCC)   Prolonged QT interval   Diabetes mellitus type 2 in obese (HCC)   BIPOLAR DISORDER UNSPECIFIED   Morbid obesity (HCC)   Chronic low back pain   Essential hypertension   Acute systolic heart failure (HCC)   Acute respiratory failure with hypoxia (HCC)   Pressure injury of skin     Assessment and Plan:  * Septic shock ([place]) secondary to aspiration pneumonia, right lower lobe Acute respiratory failure with prolonged mechanical ventilation status post tracheostomy  -Trach management by pulmonary team. Antibiotics-none; last day was 10/19 - Bronchodilators as needed, supportive care.  Out of bed to chair  Status post hernia repair  complicated by postop ileus/inflammatory enteritis Intermittent nausea vomiting -Management by general surgery.  She is working with speech and swallow.  Maintain n.p.o. status, PMV.  Tube feeds -He is with abdominal pain, nausea and vomiting, Angelita Ingles has been held yesterday, resumed at a much lower dose, plan to repeat CT abdomen/pelvis per primary team.  Hypernatremia - Sodium remains elevated, continue with D5W at 100 cc/h, free water has been held due to nausea and vomiting.  Right knee pain - Unclear etiology, no evidence of trauma.  Negative x-ray  Acute kidney injury, improving -Initially required CRRT and HD.  Now improving without dialysis  Biventricular failure, reduced EF - Last seen by CHF team 10/16.  Previously tried Bumex with minimal to no urine output.  At this point patient is on dialysis and has been well compensated.  Plans to follow-up outpatient. LHC shows clean coronaries.   Sinus tachycardia; improved.  - Now on metoprolol 50 mg twice daily.  IV as needed  Anemia of chronic disease - From underlying multiple medical issues.  Transfuse as necessary and attempt to keep hemoglobin greater than 8  Essential hypertension -Currently on metoprolol twice daily -We will add as needed hydralazine  Chronic low back pain -As needed pain control  Morbid obesity (HCC) -Body mass index is 41.37 kg/m..  Currently on tube feeds -Follow-up outpatient, weight loss diet and exercise  BIPOLAR DISORDER UNSPECIFIED -On Klonopin.  Should slowly resume home meds as appropriate  Diabetes mellitus type 2 in obese (HCC) -Sliding scale and Accu-Cheks.  Pressure area on the lip in the setting of ET tube placement which was  not present prior to admission.  Routine wound care.  PT-CIR      DVT prophylaxis: Subcu heparin Code Status: Full code Family Communication:  none at bedside  Final dispo per general surgery  Nutritional status    Signs/Symptoms: NPO status  (SBO)  Interventions: Refer to RD note for recommendations, Tube feeding, MVI  Body mass index is 35.46 kg/m.  Pressure Injury 05/22/22 Heel Right Stage 1 -  Intact skin with non-blanchable redness of a localized area usually over a bony prominence. (Active)  05/22/22 1157  Location: Heel  Location Orientation: Right  Staging: Stage 1 -  Intact skin with non-blanchable redness of a localized area usually over a bony prominence.  Wound Description (Comments):   Present on Admission: No     Subjective:  Patient with nausea, abdominal pain, she is with vomiting overnight.  Examination:  Awake, interactive, no apparent distress Symmetrical Chest wall movement, Good air movement bilaterally, CTAB RRR,No Gallops,Rubs or new Murmurs, No Parasternal Heave +ve B.Sounds, Tender  to palpation in the right upper area  Bilateral Unna boots  Foley catheter in place Tracheostomy in place Tube feeding(CORTRAK) in place  Objective: Vitals:   05/29/22 0348 05/29/22 0530 05/29/22 0735 05/29/22 0827  BP: (!) 150/97   (!) 135/107  Pulse: (!) 113 (!) 113 (!) 110 (!) 112  Resp: 15 (!) 24  20  Temp: 99.2 F (37.3 C)   99.2 F (37.3 C)  TempSrc: Oral   Axillary  SpO2: (!) 87% 96% 99% 99%  Weight:      Height:        Intake/Output Summary (Last 24 hours) at 05/29/2022 1056 Last data filed at 05/29/2022 0500 Gross per 24 hour  Intake 1473.78 ml  Output 2250 ml  Net -776.22 ml   Filed Weights   05/22/22 2349 05/24/22 0348 05/28/22 0500  Weight: 111.2 kg 91.4 kg 93.7 kg     Data Reviewed:   CBC: Recent Labs  Lab 05/23/22 0027 05/24/22 0032 05/25/22 0020 05/28/22 0024 05/29/22 0644  WBC 5.9 6.9 6.6 10.3 15.9*  HGB 7.8* 7.9* 7.6* 8.3* 9.2*  HCT 24.2* 25.7* 24.6* 26.9* 30.7*  MCV 85.2 88.0 88.2 91.2 92.2  PLT 341 417* 462* 598* 235*   Basic Metabolic Panel: Recent Labs  Lab 05/25/22 0020 05/26/22 0026 05/27/22 0019 05/27/22 1227 05/28/22 0024 05/28/22 1813  05/29/22 0635 05/29/22 0644  NA 142 143 151* 150* 153* 149*  --  150*  K 4.0 4.0 4.0 4.2 4.2 4.8  --  5.3*  CL 108 110 116* 118* 120* 117*  --  114*  CO2 '27 25 28 24 27 24  '$ --  22  GLUCOSE 120* 125* 137* 221* 120* 123*  --  114*  BUN 98* 103* 93* 92* 83* 65*  --  56*  CREATININE 3.57* 3.12* 2.65* 2.55* 2.44* 2.11*  --  1.98*  CALCIUM 9.2 8.8* 9.0 9.1 9.1 9.1  --  9.3  MG 2.5* 2.5* 2.6*  --  2.5*  --  2.1  --   PHOS 4.8* 4.2 4.1 3.7 4.2  --   --  4.9*   GFR: Estimated Creatinine Clearance: 36.9 mL/min (A) (by C-G formula based on SCr of 1.98 mg/dL (H)). Liver Function Tests: Recent Labs  Lab 05/26/22 0026 05/27/22 0019 05/27/22 1227 05/28/22 0024 05/29/22 0644  AST  --   --   --   --  113*  ALT  --   --   --   --  197*  ALKPHOS  --   --   --   --  115  BILITOT  --   --   --   --  0.4  PROT  --   --   --   --  8.8*  ALBUMIN 2.0* 1.9* 2.2* 2.1* 2.4*  2.4*   No results for input(s): "LIPASE", "AMYLASE" in the last 168 hours. No results for input(s): "AMMONIA" in the last 168 hours. Coagulation Profile: No results for input(s): "INR", "PROTIME" in the last 168 hours. Cardiac Enzymes: No results for input(s): "CKTOTAL", "CKMB", "CKMBINDEX", "TROPONINI" in the last 168 hours. BNP (last 3 results) No results for input(s): "PROBNP" in the last 8760 hours. HbA1C: No results for input(s): "HGBA1C" in the last 72 hours. CBG: Recent Labs  Lab 05/28/22 0758 05/28/22 1130 05/28/22 1730 05/28/22 2356 05/29/22 0603  GLUCAP 107* 148* 119* 116* 104*   Lipid Profile: No results for input(s): "CHOL", "HDL", "LDLCALC", "TRIG", "CHOLHDL", "LDLDIRECT" in the last 72 hours. Thyroid Function Tests: No results for input(s): "TSH", "T4TOTAL", "FREET4", "T3FREE", "THYROIDAB" in the last 72 hours. Anemia Panel: No results for input(s): "VITAMINB12", "FOLATE", "FERRITIN", "TIBC", "IRON", "RETICCTPCT" in the last 72 hours. Sepsis Labs: No results for input(s): "PROCALCITON", "LATICACIDVEN"  in the last 168 hours.  No results found for this or any previous visit (from the past 240 hour(s)).       Radiology Studies: DG Knee Right Port  Result Date: 05/28/2022 CLINICAL DATA:  Knee pain.  Septic shock EXAM: PORTABLE RIGHT KNEE - 1-2 VIEW COMPARISON:  None Available. FINDINGS: No evidence of fracture, dislocation, or joint effusion. No evidence of arthropathy or other focal bone abnormality. Soft tissues are unremarkable. IMPRESSION: Negative. Electronically Signed   By: Franchot Gallo M.D.   On: 05/28/2022 11:03   DG Chest Port 1 View  Result Date: 05/27/2022 CLINICAL DATA:  Aspiration of gastric contents. EXAM: PORTABLE CHEST 1 VIEW COMPARISON:  Chest radiograph 05/20/2022. FINDINGS: Tracheostomy tube tip at the thoracic inlet. Weighted enteric tube in place with tip below the diaphragm not included in the field of view. Stable right internal jugular dialysis catheter. Lung volumes are low. Similar bibasilar atelectasis. No new airspace disease. Stable heart size and mediastinal contours. No pneumothorax or large pleural effusion. No pulmonary edema. IMPRESSION: 1. Low lung volumes with unchanged bibasilar atelectasis. 2. Stable support apparatus. Electronically Signed   By: Keith Rake M.D.   On: 05/27/2022 19:31   DG Abd Portable 1V  Result Date: 05/27/2022 CLINICAL DATA:  Nausea EXAM: PORTABLE ABDOMEN - 1 VIEW COMPARISON:  05/22/2022 FINDINGS: Limited exam secondary to poor penetration related to patient body habitus. Feeding tube is again seen with distal tip projecting near the duodenojejunal junction. Single mildly dilated loop of small bowel in the left hemiabdomen. No gross free intraperitoneal air on supine imaging. IMPRESSION: Limited exam. Single mildly dilated loop of small bowel in the left hemiabdomen, nonspecific and could reflect a focal ileus or enteritis. Developing obstruction not excluded. Electronically Signed   By: Davina Poke D.O.   On: 05/27/2022  14:32        Scheduled Meds:  Chlorhexidine Gluconate Cloth  6 each Topical Q0600   clonazePAM  0.5 mg Per Tube QHS   copper  2 mg Per Tube Daily   diclofenac Sodium  2 g Topical QID   feeding supplement (PROSource TF20)  60 mL Per Tube Daily   fiber  1 packet Per Tube BID   heparin injection (subcutaneous)  5,000 Units Subcutaneous Q8H   insulin aspart  0-20 Units Subcutaneous Q6H   insulin aspart  4 Units Subcutaneous Q6H   metoprolol tartrate  50 mg Per Tube BID   multivitamin  1 tablet Per Tube QHS   nutrition supplement (JUVEN)  1 packet Per Tube BID BM   mouth rinse  15 mL Mouth Rinse 4 times per day   pantoprazole (PROTONIX) IV  40 mg Intravenous Daily   polyethylene glycol  17 g Per Tube Daily   QUEtiapine  50 mg Per Tube QHS   sodium chloride flush  10-40 mL Intracatheter Q12H   sodium zirconium cyclosilicate  10 g Per Tube Once   zinc sulfate  220 mg Oral Daily   Continuous Infusions:  sodium chloride 10 mL/hr at 05/20/22 1900   albumin human     dextrose     feeding supplement (PEPTAMEN 1.5 CAL) 55 mL/hr at 05/28/22 0820   methocarbamol (ROBAXIN) IV       LOS: 35 days      Phillips Climes, MD [facility]  If 7PM-7AM, please contact night-coverage  05/29/2022, 10:56 AM

## 2022-05-29 NOTE — Progress Notes (Signed)
Inpatient Rehab Admissions Coordinator:    For last 3 therapy sessions, Pt. Has been Total+2 for transfers (compared to mod A+2 on 10/26). I do not think she is at a level to tolerate the intensity of CIR at this time. She is likely to need LTACH vs SNF. Sister, Brayton Layman, prefers facilities in [place].  I will reach out to TOC>  Clemens Catholic, [place], [place] Admissions Coordinator  [phone] (celll) [phone] (office)

## 2022-05-30 DIAGNOSIS — J9601 Acute respiratory failure with hypoxia: Secondary | ICD-10-CM | POA: Diagnosis not present

## 2022-05-30 DIAGNOSIS — I10 Essential (primary) hypertension: Secondary | ICD-10-CM | POA: Diagnosis not present

## 2022-05-30 DIAGNOSIS — A419 Sepsis, unspecified organism: Secondary | ICD-10-CM | POA: Diagnosis not present

## 2022-05-30 DIAGNOSIS — J690 Pneumonitis due to inhalation of food and vomit: Secondary | ICD-10-CM | POA: Diagnosis not present

## 2022-05-30 LAB — RENAL FUNCTION PANEL
Albumin: 2.1 g/dL — ABNORMAL LOW (ref 3.5–5.0)
Anion gap: 8 (ref 5–15)
BUN: 40 mg/dL — ABNORMAL HIGH (ref 6–20)
CO2: 24 mmol/L (ref 22–32)
Calcium: 8.8 mg/dL — ABNORMAL LOW (ref 8.9–10.3)
Chloride: 113 mmol/L — ABNORMAL HIGH (ref 98–111)
Creatinine, Ser: 1.74 mg/dL — ABNORMAL HIGH (ref 0.44–1.00)
GFR, Estimated: 35 mL/min — ABNORMAL LOW (ref >60)
Glucose, Bld: 110 mg/dL — ABNORMAL HIGH (ref 70–99)
Phosphorus: 4.7 mg/dL — ABNORMAL HIGH (ref 2.5–4.6)
Potassium: 3.4 mmol/L — ABNORMAL LOW (ref 3.5–5.1)
Sodium: 145 mmol/L (ref 135–145)

## 2022-05-30 LAB — CBC
HCT: 28 % — ABNORMAL LOW (ref 36.0–46.0)
Hemoglobin: 8.4 g/dL — ABNORMAL LOW (ref 12.0–15.0)
MCH: 27.5 pg (ref 26.0–34.0)
MCHC: 30 g/dL (ref 30.0–36.0)
MCV: 91.8 fL (ref 80.0–100.0)
Platelets: 591 10*3/uL — ABNORMAL HIGH (ref 150–400)
RBC: 3.05 MIL/uL — ABNORMAL LOW (ref 3.87–5.11)
RDW: 16.9 % — ABNORMAL HIGH (ref 11.5–15.5)
WBC: 10.9 10*3/uL — ABNORMAL HIGH (ref 4.0–10.5)
nRBC: 0 % (ref 0.0–0.2)

## 2022-05-30 LAB — GLUCOSE, CAPILLARY
Glucose-Capillary: 103 mg/dL — ABNORMAL HIGH (ref 70–99)
Glucose-Capillary: 114 mg/dL — ABNORMAL HIGH (ref 70–99)
Glucose-Capillary: 116 mg/dL — ABNORMAL HIGH (ref 70–99)
Glucose-Capillary: 120 mg/dL — ABNORMAL HIGH (ref 70–99)
Glucose-Capillary: 148 mg/dL — ABNORMAL HIGH (ref 70–99)

## 2022-05-30 LAB — MAGNESIUM: Magnesium: 2 mg/dL (ref 1.7–2.4)

## 2022-05-30 MED ORDER — DIPHENHYDRAMINE HCL 25 MG PO CAPS
25.0000 mg | ORAL_CAPSULE | Freq: Four times a day (QID) | ORAL | Status: DC | PRN
  Administered 2022-05-30 – 2022-06-02 (×3): 25 mg via ORAL
  Filled 2022-05-30 (×3): qty 1

## 2022-05-30 MED ORDER — ORAL CARE MOUTH RINSE: 15.0000 mL | OROMUCOSAL | Status: DC | PRN

## 2022-05-30 MED ORDER — ORAL CARE MOUTH RINSE
15.0000 mL | OROMUCOSAL | Status: DC
  Administered 2022-06-01 – 2022-06-21 (×75): 15 mL via OROMUCOSAL

## 2022-05-30 MED ORDER — POTASSIUM CHLORIDE 20 MEQ PO PACK
40.0000 meq | PACK | Freq: Once | ORAL | Status: AC
  Administered 2022-05-30: 40 meq
  Filled 2022-05-30: qty 2

## 2022-05-30 MED ORDER — ADULT MULTIVITAMIN W/MINERALS CH
1.0000 | ORAL_TABLET | Freq: Every day | ORAL | Status: DC
  Administered 2022-06-01 – 2022-06-13 (×13): 1
  Filled 2022-05-30 (×14): qty 1

## 2022-05-30 MED ORDER — DEXTROSE 5 % IV SOLN: INTRAVENOUS | Status: AC

## 2022-05-30 NOTE — Progress Notes (Signed)
Speech Language Pathology Treatment: Dysphagia  Patient Details Name: Patricia [name] MRN: [id number] DOB: April 24, 1971 Today's Date: 05/30/2022 Time: 0375-4360 SLP Time Calculation (min) (ACUTE ONLY): 22 min  Assessment / Plan / Recommendation Clinical Impression  Pt was seen for dysphagia treatment. She was alert and cooperative during the session. PMSV was donned throughout the session and she tolerated it well with stable vitals. Pt described her voice as "hoarse" and stated that it is approximately 50% back to baseline. Pt tolerated ice chips, puree, and regular texture solids without overt s/s of aspiration. Coughing was noted with thin liquids via cup; this was reduced, but not eliminated, with reduced bolus sizes. Mastication and oral clearance were functional. A modified barium swallow study is recommended to further assess swallow function; this cannot be coordinated with radiology for completion today so it will be tentatively planned for tomorrow. SLP will continue to follow pt.     HPI HPI: Patricia [name] is a 51 y.o. female who presented to hospital for excisional hernia repair (robotic hernia repair 04/23/22) with mesh, ileus post op on TNA. Developed vomiting and hypotension after NG came out on 04/29/22 and PNA on CXR, was tachycardic and BP improved with IV fluids. DX with sepsis and ABX started. Pt admitted to ICU on 10/2.  Was placed on levophed. Early AM on 10/3 was intubated for worsening metabolic/lactic acidosis. 10/4 cardiac cath with EF 15%; She had AKI as well. Started CRRT. Extubation 10/14 failed; underwent tracheostomy. TC trials started 10/16. Cleared by surgery to attempt POs 10/27.      SLP Plan  MBS      Recommendations for follow up therapy are one component of a multi-disciplinary discharge planning process, led by the attending physician.  Recommendations may be updated based on patient status, additional functional criteria and insurance authorization.     Recommendations  Diet recommendations: NPO (pt  may have ice chips after oral care) Medication Administration: Via alternative means Supervision: Full supervision/cueing for compensatory strategies Postural Changes and/or Swallow Maneuvers: Seated upright 90 degrees      Patient may use Passy-Muir Speech Valve: During PO intake/meals;During all waking hours (remove during sleep) PMSV Supervision: Intermittent MD: Please consider changing trach tube to : Cuffless         Oral Care Recommendations: Oral care QID Follow Up Recommendations: Acute inpatient rehab (3hours/day) Assistance recommended at discharge: PRN SLP Visit Diagnosis: Aphasia (R47.01);Dysphagia, unspecified (R13.10) Plan: MBS         Jeramy Dimmick I. Hardin Negus, [place], [place] Office number [phone]   Horton Marshall  05/30/2022, 9:37 AM

## 2022-05-30 NOTE — Progress Notes (Signed)
29 Days Post-Op   Subjective/Chief Complaint: RUQ pain improved.  CT normal yesterday. RUQ tenderness improved.    Objective: Vital signs in last 24 hours: Temp:  [98.4 F (36.9 C)-99.2 F (37.3 C)] 99 F (37.2 C) (11/02 0330) Pulse Rate:  [88-113] 96 (11/02 0330) Resp:  [18-26] 18 (11/02 0330) BP: (135-154)/(85-117) 147/117 (11/02 0330) SpO2:  [94 %-99 %] 97 % (11/02 0330) FiO2 (%):  [30 %-35 %] 30 % (11/02 0300) Weight:  [79.6 kg] 79.6 kg (11/02 0619) Last BM Date : 05/29/22  Intake/Output from previous day: 11/01 0701 - 11/02 0700 In: 3736.8 [I.V.:1447.5; NG/GT:2289.3] Out: 1110 [Urine:1110] Intake/Output this shift: Total I/O In: 850.7 [I.V.:790.7; NG/GT:60] Out: 700 [Urine:700]  Trach in place Abd - soft, RUQ tenderness improved  Lab Results:  Recent Labs    05/28/22 0024 05/29/22 0644  WBC 10.3 15.9*  HGB 8.3* 9.2*  HCT 26.9* 30.7*  PLT 598* 605*    BMET Recent Labs    05/29/22 0644 05/30/22 0028  NA 150* 145  K 5.3* 3.4*  CL 114* 113*  CO2 22 24  GLUCOSE 114* 110*  BUN 56* 40*  CREATININE 1.98* 1.74*  CALCIUM 9.3 8.8*    PT/INR No results for input(s): "LABPROT", "INR" in the last 72 hours. ABG No results for input(s): "PHART", "HCO3" in the last 72 hours.  Invalid input(s): "PCO2", "PO2"  Studies/Results: CT ABDOMEN PELVIS WO CONTRAST  Result Date: 05/29/2022 CLINICAL DATA:  Right upper quadrant abdominal pain. Concern for bowel obstruction. EXAM: CT ABDOMEN AND PELVIS WITHOUT CONTRAST TECHNIQUE: Multidetector CT imaging of the abdomen and pelvis was performed following the standard protocol without IV contrast. RADIATION DOSE REDUCTION: This exam was performed according to the departmental dose-optimization program which includes automated exposure control, adjustment of the mA and/or kV according to patient size and/or use of iterative reconstruction technique. COMPARISON:  CT abdomen pelvis dated 05/06/2022. FINDINGS: Evaluation of this  exam is limited in the absence of intravenous contrast. Lower chest: Bilateral lower lobe, right greater than left clusters of airspace opacities most consistent with pneumonia. Clinical correlation is recommended. No intra-abdominal free air or free fluid. Hepatobiliary: The liver is unremarkable. No biliary dilatation. The gallbladder is unremarkable. Pancreas: Unremarkable. No pancreatic ductal dilatation or surrounding inflammatory changes. Spleen: Normal in size without focal abnormality. Adrenals/Urinary Tract: The adrenal glands are unremarkable. The kidneys, visualized ureters, and urinary bladder appear unremarkable. Stomach/Bowel: Feeding tube with tip in the proximal jejunum in the left upper abdomen. Postsurgical changes of bowel with anastomotic suture in the anterior pelvis. There is no bowel obstruction or active inflammation. The appendix is not visualized with certainty. No inflammatory changes identified in the right lower quadrant. Vascular/Lymphatic: Mild atherosclerotic calcification of the aorta. The IVC is unremarkable. No portal venous gas. There is no adenopathy. Reproductive: Hysterectomy.  No adnexal masses. Other: Midline vertical anterior abdominal wall incisional scar. Musculoskeletal: No acute or significant osseous findings. IMPRESSION: 1. No acute intra-abdominal or pelvic pathology. No bowel obstruction. 2. Bilateral lower lobe, right greater than left clusters of airspace opacities most consistent with pneumonia. 3.  Aortic Atherosclerosis (ICD10-I70.0). Electronically Signed   By: Anner Crete M.D.   On: 05/29/2022 19:43   IR Removal Tun Cv Cath W/O FL  Result Date: 05/29/2022 INDICATION: Acute renal failure now with recovered kidney function. Request for removal of tunneled hemodialysis catheter. It was initially placed by Dr. Pascal Lux on May 15, 2022. EXAM: REMOVAL OF TUNNELED HEMODIALYSIS CATHETER MEDICATIONS: 1% lidocaine 4 mL COMPLICATIONS:  None immediate. PROCEDURE:  Informed written consent was obtained from the patient following an explanation of the procedure, risks, benefits and alternatives to treatment. A time out was performed prior to the initiation of the procedure. Maximal barrier sterile technique was utilized including sterile gloves, sterile drape, and hand hygiene. Betadine was used to prep the patient's right neck, chest and existing catheter. 1% lidocaine was injected around the catheter and the subcutaneous tunnel. The catheter was easily removed using only gentle traction. The catheter was removed intact. Hemostasis was obtained with manual compression. A dressing was placed. The patient tolerated the procedure well without immediate post procedural complication. IMPRESSION: Successful removal of tunneled dialysis catheter. Procedure performed by: Gareth Eagle, PA-C Electronically Signed   By: Markus Daft M.D.   On: 05/29/2022 17:36   DG CHEST PORT 1 VIEW  Result Date: 05/29/2022 CLINICAL DATA:  Vomited 2 feedings earlier today. EXAM: PORTABLE CHEST 1 VIEW COMPARISON:  05/27/2022 FINDINGS: The feeding tube appears stable. The cardiac silhouette, mediastinal and hilar contours are unchanged. Patchy infiltrates appears stable. No obvious new/acute pulmonary findings. IMPRESSION: 1. Stable feeding tube. 2. Stable patchy infiltrates. Electronically Signed   By: Marijo Sanes M.D.   On: 05/29/2022 16:29   DG Knee Right Port  Result Date: 05/28/2022 CLINICAL DATA:  Knee pain.  Septic shock EXAM: PORTABLE RIGHT KNEE - 1-2 VIEW COMPARISON:  None Available. FINDINGS: No evidence of fracture, dislocation, or joint effusion. No evidence of arthropathy or other focal bone abnormality. Soft tissues are unremarkable. IMPRESSION: Negative. Electronically Signed   By: Franchot Gallo M.D.   On: 05/28/2022 11:03    Anti-infectives: Anti-infectives (From admission, onward)    Start     Dose/Rate Route Frequency Ordered Stop   05/15/22 1445  ceFAZolin (ANCEF) IVPB  2g/100 mL premix       Note to Pharmacy: To be given in IR   2 g 200 mL/hr over 30 Minutes Intravenous  Once 05/15/22 1352 05/15/22 1616   05/15/22 1230  ceFAZolin (ANCEF) powder 1 g  Status:  Discontinued        1 g Other To Surgery 05/15/22 1140 05/15/22 1239   05/15/22 1230  ceFAZolin (ANCEF) IVPB 2g/100 mL premix        2 g 200 mL/hr over 30 Minutes Intravenous To Radiology 05/15/22 1140 05/15/22 1140   05/15/22 0000  ceFAZolin (ANCEF) IVPB 2g/100 mL premix        2 g 200 mL/hr over 30 Minutes Intravenous To Radiology 05/14/22 1308 05/15/22 0005   05/13/22 2134  meropenem (MERREM) 500 mg in sodium chloride 0.9 % 100 mL IVPB        500 mg 200 mL/hr over 30 Minutes Intravenous Every 24 hours 05/13/22 0815 05/17/22 0700   05/13/22 1000  meropenem (MERREM) 500 mg in sodium chloride 0.9 % 100 mL IVPB  Status:  Discontinued        500 mg 200 mL/hr over 30 Minutes Intravenous Every 12 hours 05/12/22 0940 05/12/22 0946   05/12/22 2200  meropenem (MERREM) 500 mg in sodium chloride 0.9 % 100 mL IVPB  Status:  Discontinued        500 mg 200 mL/hr over 30 Minutes Intravenous Every 12 hours 05/12/22 0946 05/13/22 0815   05/12/22 2000  meropenem (MERREM) 1 g in sodium chloride 0.9 % 100 mL IVPB  Status:  Discontinued        1 g 200 mL/hr over 30 Minutes Intravenous  Once 05/12/22 0940 05/12/22  3546   05/06/22 1530  meropenem (MERREM) 1 g in sodium chloride 0.9 % 100 mL IVPB  Status:  Discontinued        1 g 200 mL/hr over 30 Minutes Intravenous Every 8 hours 05/06/22 1438 05/12/22 0940   05/04/22 1000  vancomycin (VANCOCIN) IVPB 1000 mg/200 mL premix  Status:  Discontinued        1,000 mg 200 mL/hr over 60 Minutes Intravenous Every 24 hours 05/03/22 0802 05/08/22 0913   05/03/22 0845  vancomycin (VANCOREADY) IVPB 2000 mg/400 mL        2,000 mg 200 mL/hr over 120 Minutes Intravenous  Once 05/03/22 0751 05/03/22 1114   05/01/22 1800  piperacillin-tazobactam (ZOSYN) IVPB 3.375 g  Status:   Discontinued        3.375 g 12.5 mL/hr over 240 Minutes Intravenous Every 6 hours 05/01/22 1426 05/01/22 1427   05/01/22 1515  piperacillin-tazobactam (ZOSYN) IVPB 3.375 g  Status:  Discontinued        3.375 g 100 mL/hr over 30 Minutes Intravenous Every 6 hours 05/01/22 1428 05/06/22 1438   04/30/22 1400  piperacillin-tazobactam (ZOSYN) IVPB 2.25 g  Status:  Discontinued        2.25 g 100 mL/hr over 30 Minutes Intravenous Every 8 hours 04/30/22 1325 05/01/22 1426   04/29/22 1530  piperacillin-tazobactam (ZOSYN) IVPB 3.375 g  Status:  Discontinued        3.375 g 12.5 mL/hr over 240 Minutes Intravenous Every 8 hours 04/29/22 1433 04/30/22 1325   04/29/22 1145  metroNIDAZOLE (FLAGYL) IVPB 500 mg  Status:  Discontinued        500 mg 100 mL/hr over 60 Minutes Intravenous Every 12 hours 04/29/22 1047 04/29/22 1433   04/29/22 1145  vancomycin (VANCOCIN) IVPB 1000 mg/200 mL premix  Status:  Discontinued        1,000 mg 200 mL/hr over 60 Minutes Intravenous  Once 04/29/22 1047 04/29/22 1055   04/29/22 1145  vancomycin (VANCOREADY) IVPB 1500 mg/300 mL  Status:  Discontinued        1,500 mg 150 mL/hr over 120 Minutes Intravenous Every 24 hours 04/29/22 1055 04/29/22 1357   04/29/22 1045  ceFEPIme (MAXIPIME) 2 g in sodium chloride 0.9 % 100 mL IVPB  Status:  Discontinued        2 g 200 mL/hr over 30 Minutes Intravenous Every 8 hours 04/29/22 0954 04/29/22 1433   04/23/22 0730  ceFAZolin (ANCEF) IVPB 2g/100 mL premix        2 g 200 mL/hr over 30 Minutes Intravenous On call to O.R. 04/23/22 0726 04/23/22 5681       Assessment/Plan: Ms. Blomgren is s/p robotic incisional hernia repair on 04/23/22 -Postop ileus complicated by aspiration: Ileus resolved, having bowel function though unreliable.  Will schedule miralax daily as it seems she may be getting constipated - Inflammatory enteritis likely due to ischemic insult with dilated bowel at time of high pressor support - small bowel dilation on x-ray  improved - CT abd/pel 05/29/22 normal - Encouraged her not to refuse medications. - Canceled 300 mL free water boluses as this may be too much at once for her small duodenal feeding tube, Sodium improving today - Increase tube feeds to goal - recheck WBC today - May need restarted on antibiotics for UTI or pneumonia (infiltrates till on CT abdomen), appreciate medicine team guidance   Acute metabolic encephalopathy superimposed on hx bipolar disorder, schizophrenia   ICU delirium- improved now off all IV infusions. Acute  biventricular HFrEF (Etiology sepsis, stress induced CM. Has clean coronaries) Cardiogenic shock resolved, EF improved.  Acute hypoxic respiratory failure Aspiration PNA Pulm edema   RLL VAP  AKI with oliguria; no baseline CKD   - appears to have some renal recovery Anemia, acute on chronic  DM2 with hyperglycemia  Severe protein calorie malnutrition  Pressure injury lip - improving  Deconditioning    Appreciate [facility] care with multiple issues   Needs therapy and recouperation     Dispo - Remain in stepdown for trach care, case Management consult for placement - difficult with many needs, insurance limitation.  Would be great if patient is accepted to inpatient rehab, but not particpating well with physical therapy at this point.    LOS: 36 days    Patricia [name] 05/30/2022

## 2022-05-30 NOTE — Progress Notes (Signed)
[facility] Progress Note   Assessment/ Plan:   AKI - likely ATN in the setting of cardiogenic and septic shock. CRRT 10/4-10/14/23 via RIJ temp HD catheter. First IHD 10/16.  Looks to be in early phase of renal recovery - UOP really up over past 4-5 days. Looks like post ATN diuresis + catabolic state.   Recovered renal function- removed [facility] 05/29/22 D/c'd Foley 05/29/22--> will do bladder scans Very poor IV access- recovered renal function and no indication for more dialysis- PICC/ Midline OK in setting of no other IV access, d/w VAST RN  Avoid nephrotoxic medications including NSAIDs and iodinated intravenous contrast exposure unless the latter is absolutely indicated.  Preferred narcotic agents for pain control are hydromorphone, fentanyl, and methadone. Morphine should not be used. Avoid Baclofen and avoid oral sodium phosphate and magnesium citrate based laxatives / bowel preps. Continue strict Input and Output monitoring. Will monitor the patient closely with you and intervene or adjust therapy as indicated by changes in clinical status/labs   Cardiogenic/septic shock - off pressors now.  Heart failure team had been following. Likely due to aspiration pneumonia. Acute hypoxic respiratory failure - was re-intubated 05/05/22.  Extubated again on 10/14 but failed BiPAP, s/p trach and back on Vent 05/11/22 per PCCM > now tolerating TC 24h/d - pulm following Anemia of critical illness - transfuse for Hgb <7. Requiring PRBCs. Per primary. Acute MI - normal coronaries by cath 10/4 Hyperphosphatemia: Receiving continuous feeds so binders of little efficacy. RD optimized feeds.  Phos normalized. S/p robotic incisional hernia repair with mesh and LOA - Surgery following.  Bipolar/schizophrenia Hypernatremia: FWF stopped d/t abd discomfort.  On D5.  If acceptable volume of FWF, would prefer enteral route for correcting hypernatremia Abd pain: getting laxatives per surg Dispo:  inpatient  for multiple issues still -- if she's able to remain dialysis independent that will really help.  Will sign off for now- call with questions   Subjective:    Seen in room.  TDC out yesterday. CT without acute pathology.  Foley out too.     Objective:   BP (!) 156/108 (BP Location: Left Wrist)   Pulse 99   Temp 98.4 F (36.9 C) (Oral)   Resp (!) 21   Ht '5\' 4"'$  (1.626 m)   Wt 79.6 kg Comment: potential scale error  LMP 07/07/2014 (Approximate)   SpO2 98%   BMI 30.12 kg/m   Intake/Output Summary (Last 24 hours) at 05/30/2022 0916 Last data filed at 05/30/2022 0300 Gross per 24 hour  Intake 3736.81 ml  Output 1110 ml  Net 2626.81 ml   Weight change:   Physical Exam: Gen:on trach collar, lying in bed CVS: RRR Resp:on TC, clear anteriorly Abd: +BS, soft, mild tenderness Ext: Trace edema in the bilateral hands with legs, UNNA boots on legs Neuro: AAO GU: foley with clear yellow urine  Imaging: CT ABDOMEN PELVIS WO CONTRAST  Result Date: 05/29/2022 CLINICAL DATA:  Right upper quadrant abdominal pain. Concern for bowel obstruction. EXAM: CT ABDOMEN AND PELVIS WITHOUT CONTRAST TECHNIQUE: Multidetector CT imaging of the abdomen and pelvis was performed following the standard protocol without IV contrast. RADIATION DOSE REDUCTION: This exam was performed according to the departmental dose-optimization program which includes automated exposure control, adjustment of the mA and/or kV according to patient size and/or use of iterative reconstruction technique. COMPARISON:  CT abdomen pelvis dated 05/06/2022. FINDINGS: Evaluation of this exam is limited in the absence of intravenous contrast. Lower chest: Bilateral lower lobe,  right greater than left clusters of airspace opacities most consistent with pneumonia. Clinical correlation is recommended. No intra-abdominal free air or free fluid. Hepatobiliary: The liver is unremarkable. No biliary dilatation. The gallbladder is unremarkable.  Pancreas: Unremarkable. No pancreatic ductal dilatation or surrounding inflammatory changes. Spleen: Normal in size without focal abnormality. Adrenals/Urinary Tract: The adrenal glands are unremarkable. The kidneys, visualized ureters, and urinary bladder appear unremarkable. Stomach/Bowel: Feeding tube with tip in the proximal jejunum in the left upper abdomen. Postsurgical changes of bowel with anastomotic suture in the anterior pelvis. There is no bowel obstruction or active inflammation. The appendix is not visualized with certainty. No inflammatory changes identified in the right lower quadrant. Vascular/Lymphatic: Mild atherosclerotic calcification of the aorta. The IVC is unremarkable. No portal venous gas. There is no adenopathy. Reproductive: Hysterectomy.  No adnexal masses. Other: Midline vertical anterior abdominal wall incisional scar. Musculoskeletal: No acute or significant osseous findings. IMPRESSION: 1. No acute intra-abdominal or pelvic pathology. No bowel obstruction. 2. Bilateral lower lobe, right greater than left clusters of airspace opacities most consistent with pneumonia. 3.  Aortic Atherosclerosis (ICD10-I70.0). Electronically Signed   By: Anner Crete M.D.   On: 05/29/2022 19:43   IR Removal Tun Cv Cath W/O FL  Result Date: 05/29/2022 INDICATION: Acute renal failure now with recovered kidney function. Request for removal of tunneled hemodialysis catheter. It was initially placed by Dr. Pascal Lux on May 15, 2022. EXAM: REMOVAL OF TUNNELED HEMODIALYSIS CATHETER MEDICATIONS: 1% lidocaine 4 mL COMPLICATIONS: None immediate. PROCEDURE: Informed written consent was obtained from the patient following an explanation of the procedure, risks, benefits and alternatives to treatment. A time out was performed prior to the initiation of the procedure. Maximal barrier sterile technique was utilized including sterile gloves, sterile drape, and hand hygiene. Betadine was used to prep the  patient's right neck, chest and existing catheter. 1% lidocaine was injected around the catheter and the subcutaneous tunnel. The catheter was easily removed using only gentle traction. The catheter was removed intact. Hemostasis was obtained with manual compression. A dressing was placed. The patient tolerated the procedure well without immediate post procedural complication. IMPRESSION: Successful removal of tunneled dialysis catheter. Procedure performed by: Gareth Eagle, PA-C Electronically Signed   By: Markus Daft M.D.   On: 05/29/2022 17:36   DG CHEST PORT 1 VIEW  Result Date: 05/29/2022 CLINICAL DATA:  Vomited 2 feedings earlier today. EXAM: PORTABLE CHEST 1 VIEW COMPARISON:  05/27/2022 FINDINGS: The feeding tube appears stable. The cardiac silhouette, mediastinal and hilar contours are unchanged. Patchy infiltrates appears stable. No obvious new/acute pulmonary findings. IMPRESSION: 1. Stable feeding tube. 2. Stable patchy infiltrates. Electronically Signed   By: Marijo Sanes M.D.   On: 05/29/2022 16:29   DG Knee Right Port  Result Date: 05/28/2022 CLINICAL DATA:  Knee pain.  Septic shock EXAM: PORTABLE RIGHT KNEE - 1-2 VIEW COMPARISON:  None Available. FINDINGS: No evidence of fracture, dislocation, or joint effusion. No evidence of arthropathy or other focal bone abnormality. Soft tissues are unremarkable. IMPRESSION: Negative. Electronically Signed   By: Franchot Gallo M.D.   On: 05/28/2022 11:03    Labs: BMET Recent Labs  Lab 05/25/22 0020 05/26/22 0026 05/27/22 0019 05/27/22 1227 05/28/22 0024 05/28/22 1813 05/29/22 0644 05/30/22 0028  NA 142 143 151* 150* 153* 149* 150* 145  K 4.0 4.0 4.0 4.2 4.2 4.8 5.3* 3.4*  CL 108 110 116* 118* 120* 117* 114* 113*  CO2 '27 25 28 24 27 24 22 24  '$ GLUCOSE  120* 125* 137* 221* 120* 123* 114* 110*  BUN 98* 103* 93* 92* 83* 65* 56* 40*  CREATININE 3.57* 3.12* 2.65* 2.55* 2.44* 2.11* 1.98* 1.74*  CALCIUM 9.2 8.8* 9.0 9.1 9.1 9.1 9.3 8.8*  PHOS  4.8* 4.2 4.1 3.7 4.2  --  4.9* 4.7*   CBC Recent Labs  Lab 05/25/22 0020 05/28/22 0024 05/29/22 0644 05/30/22 0028  WBC 6.6 10.3 15.9* 10.9*  HGB 7.6* 8.3* 9.2* 8.4*  HCT 24.6* 26.9* 30.7* 28.0*  MCV 88.2 91.2 92.2 91.8  PLT 462* 598* 605* 591*    Medications:     clonazePAM  0.5 mg Per Tube QHS   copper  2 mg Per Tube Daily   diclofenac Sodium  2 g Topical QID   feeding supplement (PROSource TF20)  60 mL Per Tube Daily   fiber  1 packet Per Tube BID   heparin injection (subcutaneous)  5,000 Units Subcutaneous Q8H   insulin aspart  0-20 Units Subcutaneous Q6H   insulin aspart  4 Units Subcutaneous Q6H   metoprolol tartrate  50 mg Per Tube BID   multivitamin  1 tablet Per Tube QHS   nutrition supplement (JUVEN)  1 packet Per Tube BID BM   mouth rinse  15 mL Mouth Rinse 4 times per day   pantoprazole (PROTONIX) IV  40 mg Intravenous Daily   polyethylene glycol  17 g Per Tube Daily   potassium chloride  40 mEq Per Tube Once   QUEtiapine  50 mg Per Tube QHS   sodium chloride flush  10-40 mL Intracatheter Q12H   sodium zirconium cyclosilicate  10 g Per Tube Once   zinc sulfate  220 mg Oral Daily    Madelon Lips MD 05/30/2022, 9:16 AM

## 2022-05-30 NOTE — Progress Notes (Signed)
Mobility Specialist Progress Note    05/30/22 0956  Mobility  Activity Transferred from bed to chair  Level of Assistance +2 (takes two people)  Assistive Device MaxiMove  Activity Response Tolerated well  Mobility Referral Yes  $Mobility charge 1 Mobility   Post-Mobility: 112 HR  No complaints. Left with NT present.   Hildred Alamin Mobility Specialist  Secure Chat Only

## 2022-05-30 NOTE — Progress Notes (Signed)
Patient seen today by trach team for consult.  No education is needed at this time.  All necessary equipment is at beside.   Will continue to follow for progression.  

## 2022-05-30 NOTE — NC FL2 (Signed)
[place] LEVEL OF CARE SCREENING TOOL     IDENTIFICATION  Patient Name: Patricia [name] Birthdate: 04/09/1971 Sex: female Admission Date (Current Location): 04/23/2022  [facility] and [place] Number:  Herbalist and Address:  [facility], [place] [address], [place], [place] [address]      Provider Number: [id number]  Attending Physician Name and Address:  Stechschulte, Nickola Major, MD  Relative Name and Phone Number:  Becky Augusta (Sister) [phone]    Current Level of Care: Hospital Recommended Level of Care: [place] Prior Approval Number:    Date Approved/Denied:   PASRR Number:    Discharge Plan: SNF    Current Diagnoses: Patient Active Problem List   Diagnosis Date Noted   Pressure injury of skin 03/47/4259   Acute systolic heart failure ([place])    Acute respiratory failure with hypoxia ([place])    Septic shock ([place]) 04/29/2022   Aspiration pneumonia ([place]) 04/29/2022   Prolonged QT interval 04/29/2022   Essential hypertension 04/29/2022   Hernia of abdominal cavity 04/24/2022   Incisional hernia 04/23/2022   Chronic low back pain 11/20/2021   Limited mobility 11/20/2021   Ambulates with cane 11/20/2021   Ventral hernia without obstruction or gangrene 11/15/2021   Right carpal tunnel syndrome 11/15/2021   Diabetes mellitus without complication ([place]) 56/38/7564   Insomnia 11/15/2021   Schizophrenia ([place]) 05/21/2017   Morbid obesity ([place]) 07/22/2014   Obstructive sleep apnea 04/05/2008   MIGRAINE HEADACHE 04/05/2008   Diabetes mellitus type 2 in obese ([place]) 03/21/2008   BIPOLAR DISORDER UNSPECIFIED 03/21/2008   DEPRESSION 03/21/2008   Asthma 03/21/2008    Orientation RESPIRATION BLADDER Height & Weight     Self, Time, Situation, Place  Normal Incontinent, External catheter Weight: 175 lb 8 oz (79.6 kg) (potential scale error) Height:  '5\' 4"'$  (162.6 cm)  BEHAVIORAL SYMPTOMS/MOOD NEUROLOGICAL  BOWEL NUTRITION STATUS      Incontinent Diet (See DC Summary)  AMBULATORY STATUS COMMUNICATION OF NEEDS Skin   Extensive Assist Verbally Normal                       Personal Care Assistance Level of Assistance  Bathing, Feeding, Dressing Bathing Assistance: Maximum assistance Feeding assistance: Independent Dressing Assistance: Maximum assistance     Functional Limitations Info  Sight, Hearing, Speech Sight Info: Adequate Hearing Info: Adequate Speech Info: Adequate    SPECIAL CARE FACTORS FREQUENCY  PT (By licensed PT), OT (By licensed OT)     PT Frequency: 5x a week OT Frequency: 5x a week            Contractures Contractures Info: Not present    Additional Factors Info  Code Status, Allergies Code Status Info: Full Allergies Info: Iodinated Contrast Media, Peanut-containing Drug Products, Oxycodone-acetaminophen           Current Medications (05/30/2022):  This is the current hospital active medication list Current Facility-Administered Medications  Medication Dose Route Frequency Provider Last Rate Last Admin   0.9 %  sodium chloride infusion (Manually program via Guardrails IV Fluids)   Intravenous Continuous Lestine Mount, PA-C 10 mL/hr at 05/20/22 1900 Other (enter comment in med admin window) at 05/20/22 1900   0.9 %  sodium chloride infusion (Manually program via Guardrails IV Fluids)   Intravenous PRN Lestine Mount, PA-C       acetaminophen (TYLENOL) 160 MG/5ML solution 650 mg  650 mg Per Tube Q4H PRN Stechschulte, Nickola Major,  MD   650 mg at 05/26/22 2208   acetaminophen (TYLENOL) tablet 650 mg  650 mg Per Tube Q4H PRN Stechschulte, Nickola Major, MD   650 mg at 05/19/22 1731   albumin human 25 % solution 25 g  25 g Intravenous PRN Justin Mend, MD       alteplase (CATHFLO ACTIVASE) injection 2 mg  2 mg Intracatheter Once PRN Donato Heinz, MD       alum & mag hydroxide-simeth (MAALOX/MYLANTA) 200-200-20 MG/5ML suspension 30 mL  30 mL Per Tube  Q4H PRN Stechschulte, Nickola Major, MD   30 mL at 05/20/22 1726   clonazePAM (KLONOPIN) tablet 0.5 mg  0.5 mg Per Tube QHS Erick Colace, NP   0.5 mg at 05/29/22 2147   copper tablet 2 mg  2 mg Per Tube Daily Donnamae Jude, RPH   2 mg at 05/30/22 1019   dextrose 5 % solution   Intravenous Continuous Elgergawy, Silver Huguenin, MD 25 mL/hr at 05/30/22 1015 New Bag at 05/30/22 1015   diclofenac Sodium (VOLTAREN) 1 % topical gel 2 g  2 g Topical QID Amin, Ankit Chirag, MD   2 g at 05/30/22 1018   docusate (COLACE) 50 MG/5ML liquid 100 mg  100 mg Per Tube BID PRN Kipp Brood, MD   100 mg at 05/24/22 2048   feeding supplement (PEPTAMEN 1.5 CAL) liquid 1,000 mL  1,000 mL Per Tube Continuous Stechschulte, Nickola Major, MD 55 mL/hr at 05/30/22 1012 1,000 mL at 05/30/22 1012   feeding supplement (PROSource TF20) liquid 60 mL  60 mL Per Tube Daily Stechschulte, Nickola Major, MD   60 mL at 05/30/22 1020   fiber (NUTRISOURCE FIBER) 1 packet  1 packet Per Tube BID Kipp Brood, MD   1 packet at 05/30/22 1019   Gerhardt's butt cream   Topical PRN Kipp Brood, MD   Given at 05/18/22 0906   heparin injection 1,000 Units  1,000 Units Intracatheter PRN Donato Heinz, MD   1,000 Units at 05/22/22 1840   heparin injection 5,000 Units  5,000 Units Subcutaneous Q8H [name] P, DO   5,000 Units at 05/30/22 1443   hydrALAZINE (APRESOLINE) injection 5 mg  5 mg Intravenous Q4H PRN Elgergawy, Silver Huguenin, MD       HYDROmorphone (DILAUDID) injection 0.5 mg  0.5 mg Intravenous Q4H PRN Jillyn Ledger, PA-C   0.5 mg at 05/30/22 1017   insulin aspart (novoLOG) injection 0-20 Units  0-20 Units Subcutaneous Q6H Amin, Ankit Chirag, MD   3 Units at 05/29/22 1210   insulin aspart (novoLOG) injection 4 Units  4 Units Subcutaneous Q6H Wilson Singer I, RPH       lip balm (CARMEX) ointment   Topical PRN Consuelo Pandy, PA-C   Given at 05/29/22 1344   methocarbamol (ROBAXIN) 500 mg in dextrose 5 % 50 mL IVPB  500 mg Intravenous Q8H  PRN Elgergawy, Silver Huguenin, MD       metoprolol tartrate (LOPRESSOR) tablet 50 mg  50 mg Per Tube BID Amin, Ankit Chirag, MD   50 mg at 05/30/22 1019   multivitamin (RENA-VIT) tablet 1 tablet  1 tablet Per Tube QHS Julian Hy, DO   1 tablet at 05/27/22 2123   nitroGLYCERIN (NITROSTAT) SL tablet 0.4 mg  0.4 mg Sublingual Q5 min PRN Stechschulte, Nickola Major, MD       nutrition supplement (JUVEN) (JUVEN) powder packet 1 packet  1 packet Per Tube BID BM Stechschulte, Eddie Dibbles  J, MD   1 packet at 05/30/22 1020   ondansetron (ZOFRAN) injection 4 mg  4 mg Intravenous Q6H PRN [name], MD   4 mg at 05/28/22 2236   Oral care mouth rinse  15 mL Mouth Rinse 4 times per day Icard, Bradley L, DO   15 mL at 05/30/22 1035   Oral care mouth rinse  15 mL Mouth Rinse PRN Icard, Bradley L, DO       pantoprazole (PROTONIX) injection 40 mg  40 mg Intravenous Daily Donnamae Jude, RPH   40 mg at 05/30/22 1019   polyethylene glycol (MIRALAX / GLYCOLAX) packet 17 g  17 g Per Tube Daily Stechschulte, Nickola Major, MD   17 g at 05/30/22 1020   QUEtiapine (SEROQUEL) tablet 50 mg  50 mg Per Tube QHS Maryjane Hurter, MD   50 mg at 05/29/22 2147   sodium chloride flush (NS) 0.9 % injection 10-40 mL  10-40 mL Intracatheter Q12H Stechschulte, Nickola Major, MD   10 mL at 05/30/22 1032   sodium chloride flush (NS) 0.9 % injection 10-40 mL  10-40 mL Intracatheter PRN Stechschulte, Nickola Major, MD       zinc sulfate capsule 220 mg  220 mg Oral Daily Donnamae Jude, RPH   220 mg at 05/29/22 2148     Discharge Medications: Please see discharge summary for a list of discharge medications.  Relevant Imaging Results:  Relevant Lab Results:   Additional Information SSN: [id number], Johnny Bridge, Latanya Presser

## 2022-05-30 NOTE — Progress Notes (Signed)
PROGRESS NOTE    Patricia [name]  WUG:[id number] DOB: 08-31-1970 DOA: 04/23/2022 PCP: [name], MD   Brief Narrative:   51 year old with history of DM2, depression, migraine, asthma, schizophrenia, HTN, HLD presented to the hospital for exertional hernia, robotic hernia repair on 04/23/2022 with mesh who developed postop ileus and is on TNA.  Also patient developed vomiting and hypotension after NG tube came out on 04/29/2022 with concerns of pneumonia which was treated with antibiotics and IV fluids.  During that time patient also required Levophed and eventually intubated on 10/3 for worsening metabolic acidosis complicated by acute kidney injury.  Echocardiogram showed depressed EF of 30-35% with elevated troponin.  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  Patient was seen by CHF team started on dopamine and CRRT as well.  Patient was again reintubated due to neuromuscular weakness on 10/8.  Eventually extubated on 10/14 to BiPAP and trach was placed due to weakness.  Tube feeds held due to nausea vomiting on 10/30.  Hospital course complicated by hypernatremia, started D5 water   Assessment & Plan:  Principal Problem:   Septic shock ([place]) Active Problems:   Hernia of abdominal cavity   Aspiration pneumonia (HCC)   Prolonged QT interval   Diabetes mellitus type 2 in obese (HCC)   BIPOLAR DISORDER UNSPECIFIED   Morbid obesity (HCC)   Chronic low back pain   Essential hypertension   Acute systolic heart failure (HCC)   Acute respiratory failure with hypoxia (HCC)   Pressure injury of skin     Assessment and Plan:  * Septic shock ([place]) secondary to aspiration pneumonia, right lower lobe Acute respiratory failure with prolonged mechanical ventilation status post tracheostomy  -Trach management by pulmonary team. Antibiotics-none; last day was 10/19 - Bronchodilators as needed, supportive care.  Out of bed to chair -CT abdomen pelvis with  significant bibasilar opacities, with concerns of pneumonia, but reviewing previous imaging even dating back to CT chest 04/30/2022 she does appears to be having some these persistent opacities at the bases, so for now I will hold on initiating any antibiotics especially with no fever, or increased trach secretions, and leukocytosis appears stable, certainly low threshold to start antibiotics if any signs of infection or pneumonia or worsening respiratory status develop.  Status post hernia repair complicated by postop ileus/inflammatory enteritis Intermittent nausea vomiting -Management by general surgery.  She is working with speech and swallow.  Maintain n.p.o. status, PMV.  Tube feeds -Management per primary surgical team, repeat CT abdomen with no acute abdominal findings. -No nausea or vomiting over last 24 hours, will start on as needed laxatives   Hypernatremia - Sodium is improving, on D5W for now, hopefully can transition to free water via tube in 1 to 2 days if no further nausea or vomiting .  Right knee pain - Unclear etiology, no evidence of trauma.  Negative x-ray  Acute kidney injury, improving -Initially required CRRT and HD.  Now improving without dialysis  Biventricular failure, reduced EF - Last seen by CHF team 10/16.  Previously tried Bumex with minimal to no urine output.  At this point patient is on dialysis and has been well compensated.  Plans to follow-up outpatient. LHC shows clean coronaries.   Sinus tachycardia; improved.  - Now on metoprolol 50 mg twice daily.  IV as needed  Anemia of chronic disease - From underlying multiple medical issues.  Transfuse as necessary and attempt to keep hemoglobin greater than 8  Essential  hypertension -Currently on metoprolol twice daily -We will add as needed hydralazine  Chronic low back pain -As needed pain control  Morbid obesity (HCC) -Body mass index is 41.37 kg/m..  Currently on tube feeds -Follow-up outpatient,  weight loss diet and exercise  BIPOLAR DISORDER UNSPECIFIED -On Klonopin.  Should slowly resume home meds as appropriate  Diabetes mellitus type 2 in obese (HCC) -Sliding scale and Accu-Cheks.  Pressure area on the lip in the setting of ET tube placement which was not present prior to admission.  Routine wound care.  PT-CIR      DVT prophylaxis: Subcu heparin Code Status: Full code Family Communication:  none at bedside  Final dispo per general surgery  Nutritional status    Signs/Symptoms: NPO status (SBO)  Interventions: Refer to RD note for recommendations, Tube feeding, MVI  Body mass index is 30.12 kg/m.  Pressure Injury 05/22/22 Heel Right Stage 1 -  Intact skin with non-blanchable redness of a localized area usually over a bony prominence. (Active)  05/22/22 1157  Location: Heel  Location Orientation: Right  Staging: Stage 1 -  Intact skin with non-blanchable redness of a localized area usually over a bony prominence.  Wound Description (Comments):   Present on Admission: No     Subjective:  Denies any nausea, vomiting or abdominal pain today, she is complaining of right  Examination:  Awake Alert, communicative and appropriate today. Symmetrical Chest wall movement, diminished air entry at the bases RRR,No Gallops,Rubs or new Murmurs, No Parasternal Heave +ve B.Sounds, Abd Soft, No tenderness, No rebound - guarding or rigidity. Bilateral Unna boots   Tracheostomy in place Tube feeding(CORTRAK) in place  Objective: Vitals:   05/30/22 0330 05/30/22 0619 05/30/22 0811 05/30/22 0854  BP: (!) 147/117  (!) 156/108   Pulse: 96  99   Resp: 18  (!) 21   Temp: 99 F (37.2 C)  98.4 F (36.9 C)   TempSrc: Oral  Oral   SpO2: 97%  97% 98%  Weight:  79.6 kg    Height:        Intake/Output Summary (Last 24 hours) at 05/30/2022 0950 Last data filed at 05/30/2022 0300 Gross per 24 hour  Intake 3736.81 ml  Output 1110 ml  Net 2626.81 ml   Filed  Weights   05/24/22 0348 05/28/22 0500 05/30/22 0619  Weight: 91.4 kg 93.7 kg 79.6 kg     Data Reviewed:   CBC: Recent Labs  Lab 05/24/22 0032 05/25/22 0020 05/28/22 0024 05/29/22 0644 05/30/22 0028  WBC 6.9 6.6 10.3 15.9* 10.9*  HGB 7.9* 7.6* 8.3* 9.2* 8.4*  HCT 25.7* 24.6* 26.9* 30.7* 28.0*  MCV 88.0 88.2 91.2 92.2 91.8  PLT 417* 462* 598* 605* 854*   Basic Metabolic Panel: Recent Labs  Lab 05/26/22 0026 05/27/22 0019 05/27/22 1227 05/28/22 0024 05/28/22 1813 05/29/22 0635 05/29/22 0644 05/30/22 0028  NA 143 151* 150* 153* 149*  --  150* 145  K 4.0 4.0 4.2 4.2 4.8  --  5.3* 3.4*  CL 110 116* 118* 120* 117*  --  114* 113*  CO2 '25 28 24 27 24  '$ --  22 24  GLUCOSE 125* 137* 221* 120* 123*  --  114* 110*  BUN 103* 93* 92* 83* 65*  --  56* 40*  CREATININE 3.12* 2.65* 2.55* 2.44* 2.11*  --  1.98* 1.74*  CALCIUM 8.8* 9.0 9.1 9.1 9.1  --  9.3 8.8*  MG 2.5* 2.6*  --  2.5*  --  2.1  --  2.0  PHOS 4.2 4.1 3.7 4.2  --   --  4.9* 4.7*   GFR: Estimated Creatinine Clearance: 38.6 mL/min (A) (by C-G formula based on SCr of 1.74 mg/dL (H)). Liver Function Tests: Recent Labs  Lab 05/27/22 0019 05/27/22 1227 05/28/22 0024 05/29/22 0644 05/30/22 0028  AST  --   --   --  113*  --   ALT  --   --   --  197*  --   ALKPHOS  --   --   --  115  --   BILITOT  --   --   --  0.4  --   PROT  --   --   --  8.8*  --   ALBUMIN 1.9* 2.2* 2.1* 2.4*  2.4* 2.1*   No results for input(s): "LIPASE", "AMYLASE" in the last 168 hours. No results for input(s): "AMMONIA" in the last 168 hours. Coagulation Profile: No results for input(s): "INR", "PROTIME" in the last 168 hours. Cardiac Enzymes: No results for input(s): "CKTOTAL", "CKMB", "CKMBINDEX", "TROPONINI" in the last 168 hours. BNP (last 3 results) No results for input(s): "PROBNP" in the last 8760 hours. HbA1C: No results for input(s): "HGBA1C" in the last 72 hours. CBG: Recent Labs  Lab 05/29/22 0603 05/29/22 1144  05/29/22 1724 05/29/22 2326 05/30/22 0619  GLUCAP 104* 128* 84 107* 103*   Lipid Profile: No results for input(s): "CHOL", "HDL", "LDLCALC", "TRIG", "CHOLHDL", "LDLDIRECT" in the last 72 hours. Thyroid Function Tests: No results for input(s): "TSH", "T4TOTAL", "FREET4", "T3FREE", "THYROIDAB" in the last 72 hours. Anemia Panel: No results for input(s): "VITAMINB12", "FOLATE", "FERRITIN", "TIBC", "IRON", "RETICCTPCT" in the last 72 hours. Sepsis Labs: No results for input(s): "PROCALCITON", "LATICACIDVEN" in the last 168 hours.  No results found for this or any previous visit (from the past 240 hour(s)).       Radiology Studies: CT ABDOMEN PELVIS WO CONTRAST  Result Date: 05/29/2022 CLINICAL DATA:  Right upper quadrant abdominal pain. Concern for bowel obstruction. EXAM: CT ABDOMEN AND PELVIS WITHOUT CONTRAST TECHNIQUE: Multidetector CT imaging of the abdomen and pelvis was performed following the standard protocol without IV contrast. RADIATION DOSE REDUCTION: This exam was performed according to the departmental dose-optimization program which includes automated exposure control, adjustment of the mA and/or kV according to patient size and/or use of iterative reconstruction technique. COMPARISON:  CT abdomen pelvis dated 05/06/2022. FINDINGS: Evaluation of this exam is limited in the absence of intravenous contrast. Lower chest: Bilateral lower lobe, right greater than left clusters of airspace opacities most consistent with pneumonia. Clinical correlation is recommended. No intra-abdominal free air or free fluid. Hepatobiliary: The liver is unremarkable. No biliary dilatation. The gallbladder is unremarkable. Pancreas: Unremarkable. No pancreatic ductal dilatation or surrounding inflammatory changes. Spleen: Normal in size without focal abnormality. Adrenals/Urinary Tract: The adrenal glands are unremarkable. The kidneys, visualized ureters, and urinary bladder appear unremarkable.  Stomach/Bowel: Feeding tube with tip in the proximal jejunum in the left upper abdomen. Postsurgical changes of bowel with anastomotic suture in the anterior pelvis. There is no bowel obstruction or active inflammation. The appendix is not visualized with certainty. No inflammatory changes identified in the right lower quadrant. Vascular/Lymphatic: Mild atherosclerotic calcification of the aorta. The IVC is unremarkable. No portal venous gas. There is no adenopathy. Reproductive: Hysterectomy.  No adnexal masses. Other: Midline vertical anterior abdominal wall incisional scar. Musculoskeletal: No acute or significant osseous findings. IMPRESSION: 1. No acute intra-abdominal or pelvic pathology. No bowel obstruction. 2. Bilateral lower  lobe, right greater than left clusters of airspace opacities most consistent with pneumonia. 3.  Aortic Atherosclerosis (ICD10-I70.0). Electronically Signed   By: Anner Crete M.D.   On: 05/29/2022 19:43   IR Removal Tun Cv Cath W/O FL  Result Date: 05/29/2022 INDICATION: Acute renal failure now with recovered kidney function. Request for removal of tunneled hemodialysis catheter. It was initially placed by Dr. Pascal Lux on May 15, 2022. EXAM: REMOVAL OF TUNNELED HEMODIALYSIS CATHETER MEDICATIONS: 1% lidocaine 4 mL COMPLICATIONS: None immediate. PROCEDURE: Informed written consent was obtained from the patient following an explanation of the procedure, risks, benefits and alternatives to treatment. A time out was performed prior to the initiation of the procedure. Maximal barrier sterile technique was utilized including sterile gloves, sterile drape, and hand hygiene. Betadine was used to prep the patient's right neck, chest and existing catheter. 1% lidocaine was injected around the catheter and the subcutaneous tunnel. The catheter was easily removed using only gentle traction. The catheter was removed intact. Hemostasis was obtained with manual compression. A dressing was  placed. The patient tolerated the procedure well without immediate post procedural complication. IMPRESSION: Successful removal of tunneled dialysis catheter. Procedure performed by: Gareth Eagle, PA-C Electronically Signed   By: Markus Daft M.D.   On: 05/29/2022 17:36   DG CHEST PORT 1 VIEW  Result Date: 05/29/2022 CLINICAL DATA:  Vomited 2 feedings earlier today. EXAM: PORTABLE CHEST 1 VIEW COMPARISON:  05/27/2022 FINDINGS: The feeding tube appears stable. The cardiac silhouette, mediastinal and hilar contours are unchanged. Patchy infiltrates appears stable. No obvious new/acute pulmonary findings. IMPRESSION: 1. Stable feeding tube. 2. Stable patchy infiltrates. Electronically Signed   By: Marijo Sanes M.D.   On: 05/29/2022 16:29   DG Knee Right Port  Result Date: 05/28/2022 CLINICAL DATA:  Knee pain.  Septic shock EXAM: PORTABLE RIGHT KNEE - 1-2 VIEW COMPARISON:  None Available. FINDINGS: No evidence of fracture, dislocation, or joint effusion. No evidence of arthropathy or other focal bone abnormality. Soft tissues are unremarkable. IMPRESSION: Negative. Electronically Signed   By: Franchot Gallo M.D.   On: 05/28/2022 11:03        Scheduled Meds:  clonazePAM  0.5 mg Per Tube QHS   copper  2 mg Per Tube Daily   diclofenac Sodium  2 g Topical QID   feeding supplement (PROSource TF20)  60 mL Per Tube Daily   fiber  1 packet Per Tube BID   heparin injection (subcutaneous)  5,000 Units Subcutaneous Q8H   insulin aspart  0-20 Units Subcutaneous Q6H   insulin aspart  4 Units Subcutaneous Q6H   metoprolol tartrate  50 mg Per Tube BID   multivitamin  1 tablet Per Tube QHS   nutrition supplement (JUVEN)  1 packet Per Tube BID BM   mouth rinse  15 mL Mouth Rinse 4 times per day   pantoprazole (PROTONIX) IV  40 mg Intravenous Daily   polyethylene glycol  17 g Per Tube Daily   potassium chloride  40 mEq Per Tube Once   QUEtiapine  50 mg Per Tube QHS   sodium chloride flush  10-40 mL  Intracatheter Q12H   zinc sulfate  220 mg Oral Daily   Continuous Infusions:  sodium chloride 10 mL/hr at 05/20/22 1900   albumin human     dextrose     feeding supplement (PEPTAMEN 1.5 CAL) Stopped (05/29/22 1345)   methocarbamol (ROBAXIN) IV       LOS: 36 days  Phillips Climes, MD [facility]  If 7PM-7AM, please contact night-coverage  05/30/2022, 9:50 AM

## 2022-05-30 NOTE — Progress Notes (Signed)
Mobility Specialist Progress Note    05/30/22 1352  Mobility  Activity Transferred from chair to bed  Level of Assistance +2 (takes two people)  Assistive Device MaxiMove  Activity Response Tolerated well  Mobility Referral Yes  $Mobility charge 1 Mobility   No complaints. Left with NT present.   Hildred Alamin Mobility Specialist  Secure Chat Only

## 2022-05-30 NOTE — Progress Notes (Signed)
Potential bed scale error this AM (reading 15% less than documented 2 days ago).  Patient does not want to go to chair at this time d/t pain.  Will pass in report to re-zero bed when patient goes to chair today so we can obtain accurate weights.

## 2022-05-30 NOTE — Progress Notes (Signed)
Called to CT to monitor & assist w/transport of pt. Pt tol CT and transport back to 2C04.

## 2022-05-30 NOTE — Progress Notes (Signed)

## 2022-05-30 NOTE — Progress Notes (Signed)
Physical Therapy Treatment Patient Details Name: Patricia [name] MRN: [id number] DOB: March 13, 1971 Today's Date: 05/30/2022   History of Present Illness 52 yo female admitted 04/23/22 for excisional hernia repair with mesh. Course complicated by post-op ileus, tachycardia, PNA, sepsis. Transfer to ICU 10/2 on levophed. Worsening acidosis 10/3 requiring intubation. Cardiac cath 10/4 with EF 15%. Pt with AKI; on CRRT 10/5-10/14. Failed extubation 10/8 and 10/14; s/p trach 10/14. PMH includes depression, OSA, asthma, schizophrenia, DM, insomnia.    PT Comments    Pt was able to successfully come to stand using the stedy today, requiring x2 attempts and maxAx2 before being successful in coming to stand from the recliner. She required only modAx2 to transfer to stand an additional x2 reps from the stedy flaps, with noted improved upright posture each rep. She does fatigue easily though. Encouraged pt to perform repeated sit <> stands and LAQ exercises this session to strengthen her quads for standing. Will continue to follow acutely. Current recommendations remain appropriate provided she continue to make good progress with mobility, otherwise she may need to go to a SNF vs [facility]ACH for rehab.     Recommendations for follow up therapy are one component of a multi-disciplinary discharge planning process, led by the attending physician.  Recommendations may be updated based on patient status, additional functional criteria and insurance authorization.  Follow Up Recommendations  Acute inpatient rehab (3hours/day) (pending continued progress) Can patient physically be transported by private vehicle: No   Assistance Recommended at Discharge Frequent or constant Supervision/Assistance  Patient can return home with the following Two people to help with walking and/or transfers;Two people to help with bathing/dressing/bathroom;Assistance with cooking/housework;Assistance with feeding;Direct supervision/assist  for medications management;Direct supervision/assist for financial management;Assist for transportation;Help with stairs or ramp for entrance   Equipment Recommendations  Hospital bed;Wheelchair (measurements PT);Wheelchair cushion (measurements PT);Other (comment) (lift equipment)    Recommendations for Other Services       Precautions / Restrictions Precautions Precautions: Fall;Other (comment) Precaution Comments: trach collar, PMSV, cortrak Restrictions Weight Bearing Restrictions: No     Mobility  Bed Mobility               General bed mobility comments: pt sitting up in chair upon arrival    Transfers Overall transfer level: Needs assistance Equipment used: Ambulation equipment used Transfers: Sit to/from Stand Sit to Stand: Mod assist, Max assist, +2 physical assistance, +2 safety/equipment           General transfer comment: Pt needing help to place feet on stedy platform. x2 attempts before successful to stand from recliner using stedy, maxAx2 using bed pad to facilitate hip extension. Pt propping self on forearms on stedy bar once fatigued quickly. Needs cues to keep hands on bar and arms extended. ModAx2 to come to stand from stedy flaps 2x, cuing to get abdomen to bar for improve upright posture, noted success. Transfer via Lift Equipment: Stedy  Ambulation/Gait               General Gait Details: unable   Marine scientist Rankin (Stroke Patients Only)       Balance Overall balance assessment: Needs assistance         Standing balance support: Bilateral upper extremity supported, Reliant on assistive device for balance Standing balance-Leahy Scale: Poor Standing balance comment: Mod-maxAx2 to stand in stedy 3x ~15-30 sec each bout  Cognition Arousal/Alertness: Awake/alert Behavior During Therapy: Flat affect Overall Cognitive Status: Impaired/Different  from baseline Area of Impairment: Attention, Following commands, Safety/judgement, Problem solving                   Current Attention Level: Sustained   Following Commands: Follows one step commands with increased time Safety/Judgement: Decreased awareness of safety, Decreased awareness of deficits   Problem Solving: Requires tactile cues, Requires verbal cues, Slow processing, Decreased initiation, Difficulty sequencing General Comments: Pt slow to process cues, needing repetition often. Needs cues to voice her concerns since her PMV was on.        Exercises General Exercises - Lower Extremity Long Arc Quad: AROM, Both, 5 reps, Seated (fatigued and distracted easily)    General Comments        Pertinent Vitals/Pain Pain Assessment Pain Assessment: Faces Faces Pain Scale: Hurts little more Pain Location: spasms, did not specify location Pain Descriptors / Indicators: Grimacing, Spasm Pain Intervention(s): Limited activity within patient's tolerance, Monitored during session, Repositioned    Home Living                          Prior Function            PT Goals (current goals can now be found in the care plan section) Acute Rehab PT Goals Patient Stated Goal: to improve PT Goal Formulation: With patient Time For Goal Achievement: 06/06/22 Potential to Achieve Goals: Fair Progress towards PT goals: Progressing toward goals    Frequency    Min 3X/week      PT Plan Current plan remains appropriate    Co-evaluation              AM-PAC PT "6 Clicks" Mobility   Outcome Measure  Help needed turning from your back to your side while in a flat bed without using bedrails?: A Lot Help needed moving from lying on your back to sitting on the side of a flat bed without using bedrails?: Total Help needed moving to and from a bed to a chair (including a wheelchair)?: Total Help needed standing up from a chair using your arms (e.g., wheelchair or  bedside chair)?: Total Help needed to walk in hospital room?: Total Help needed climbing 3-5 steps with a railing? : Total 6 Click Score: 7    End of Session Equipment Utilized During Treatment: Gait belt Activity Tolerance: Patient limited by fatigue Patient left: in chair;with call bell/phone within reach;with chair alarm set Nurse Communication: Mobility status PT Visit Diagnosis: Muscle weakness (generalized) (M62.81);Difficulty in walking, not elsewhere classified (R26.2);Other abnormalities of gait and mobility (R26.89);Unsteadiness on feet (R26.81)     Time: 7681-1572 PT Time Calculation (min) (ACUTE ONLY): 23 min  Charges:  $Therapeutic Exercise: 8-22 mins $Therapeutic Activity: 8-22 mins                     Moishe Spice, PT, DPT Acute Rehabilitation Services  Office: [phone]    Orvan Falconer 05/30/2022, 5:01 PM

## 2022-05-30 NOTE — Progress Notes (Signed)
Nutrition Follow-up  DOCUMENTATION CODES:   Not applicable  INTERVENTION:   Tube feeding via Cortrak: Peptamen at 55 ml/h  Prosource TF20 60 ml daily Provides 2060 kcal, 110 gm protein, 1014 ml free water daily  - Discontinue Juven - DTI to upper lip healing  - Adjust Rena-vit to Multivitamin with minerals - Continue Zinc sulfate '220mg'$  daily and Copper '2mg'$  daily to correct deficiencies - Monitor weights closely   NUTRITION DIAGNOSIS:   Swallowing difficulty related to dysphagia as evidenced by NPO status (with need for MBS per SLP). *Addended, Ongoing  GOAL:   Patient will meet greater than or equal to 90% of their needs *Progressing  MONITOR:   Diet advancement, TF tolerance, Weight trends, Skin   REASON FOR ASSESSMENT:   Consult  Enteral/tube feeding initiation and management (trickle tube feeds)  ASSESSMENT:   Pt with hx of bipolar disorder, HTN, HLD, GERD, chronic bronchitis, GERD, DM type 2, and multiple prior abdominal surgeries presented to hospital for planned repair of a spigelian hernia.  09/26 - s/p robotic incisional hernia repair with mesh, extensive LOA 10/02 - transferred to ICU, pressors started, NG tube placed for decompression, TPN start 10/03 - intubated, NG tube exchanged for OG tube 10/04 - CRRT start, OG tube removed and replaced, concern for STEMI s/p L heart cath and coronary angiography without occlusion 10/08 - extubated, later re-intubated 10/09 - trickle TF initiated 10/12 - TF increased to goal of 50 10/13 - Cortrak placed 10/14 - CRRT discontinued, Extubated, Re-Intubated, Trach placed 10/15 - TPN discontinued 10/16 - First iHD  10/28 - Pt continues to work with SLP (rec NPO), remains on TFs at goal 10/30 - N/V, TFs held, remains on Aerosal trach collar 11/1 - Pt refusing TFs d/t abdominal pain; recovered renal function: TDC removed    Patient sitting in bedside chair at time of visit. Patient confirms she refused tube feeds last  night and notes this was due to bloating. Endorses this is not a new symptom and has been experiencing this since tube feeds started. Denied N/V/C currently but admits she had some nausea a few days ago that is resolved. Diarrhea remains ongoing per patient. She continues to have desire to eat and although SLP recommends continued NPO today, plan for MBS tomorrow.    Of note, patients weight has trended down since admission. However, pt denies noticing any changes in weight. Suspect weight loss during lengthy admission but not to degree as shown by most recent weight.  Admission weight (9/26): 109.3kg   10/10: 112.5kg  10/20: 108kg  10/27: 91.4kg  10/31: 93.7kg Current weight (11/2): 79.6kg   Medications reviewed and include: Nutrisource Fiber (1 packet BID), Insulin, Rena-vit, Insulin, Miralax, Zinc, Copper   Labs reviewed: Na 145 (WNL), Potassium 3.4 (L), Creatinine 1.74 (H), GFR 35 (L)   Micronutrient Labs:  CRP: 1.9 (H) Ceruloplasmin: 10.8 (L) Copper: 40 (L) Vitamin B12: 1079 Vitamin B6: 6.4 Vitamin C: 0.5 (wdl) Zinc: 34 (L)     04/30/22  05/30/22  Subcutaneous Fat   Orbital Region No depletion No depletion  Upper Arm Region No depletion No depletion  Thoracic and Lumbar Region No depletion No depletion  Buccal Region Unable to assess No depletion  Muscle   Temple Region No depletion Mild depletion  Clavicle Bone Region Mild depletion Mild depletion  Clavicle and Acromion Bone Region Mild depletion Mild depletion  Scapular Bone Region No depletion Unable to assess  Dorsal Hand No depletion Mild depletion  Patellar Region  No depletion No depletion  Anterior Thigh Region No depletion No depletion  Posterior Calf Region No depletion No depletion  Edema RD   Edema (RD Assessment) Mild Mild  Micronutrient Assessment   Hair Reviewed Reviewed  Eyes Reviewed Reviewed  Mouth Reviewed Reviewed  Skin Reviewed Reviewed  Nails Reviewed Reviewed    Diet Order:   Diet Order              Diet NPO time specified Except for: Ice Chips  Diet effective midnight                   EDUCATION NEEDS:   No education needs have been identified at this time  Skin:  Skin Assessment: Reviewed RN Assessment Skin Integrity Issues:: Stage I DTI: upper lip from ET tube Stage I: heel  Last BM:  11/1  Height:   Ht Readings from Last 1 Encounters:  05/22/22 '5\' 4"'$  (1.626 m)   Weight:   Wt Readings from Last 1 Encounters:  05/30/22 79.6 kg  Admission weight (9/26): 109.3kg   10/10: 112.5kg  10/20: 108kg  10/27: 91.4kg  10/31: 93.7kg Current weight (11/2): 79.6kg *suspect weight inaccurate   Ideal Body Weight:  54.5 kg  BMI:  Body mass index is 30.12 kg/m.   Estimated Nutritional Needs:   Kcal:  1900-2200 kcal   Protein:  80-110 g   Fluid:  1900-2000 mL    Samson Frederic RD, LDN For contact information, refer to [facility]

## 2022-05-30 NOTE — TOC Initial Note (Signed)
Transition of Care [facility]) - Initial/Assessment Note    Patient Details  Name: Patricia [name] MRN: [id number] Date of Birth: 06-Aug-1970  Transition of Care [facility]) CM/SW Contact:    Tresa Endo Phone Number: 05/30/2022, 2:51 PM  Clinical Narrative:                 CSW spoke with pt at bedside about DP, pt agreed to SNF and asked CSW to follow up with Brayton Layman (sister).   CSW spoke with Casselman who lives in [place] and states that pt will go to a SNF short term and return their other sisters house in [place] after she has completed therapy. CSW explained the possibility of the pt not being able to get a facility in [place]. Brayton Layman is understanding and is willing to accept a SNF in [facility]. There are no offers yet, CSW will continue to follow up with pt sister on DP.   Expected Discharge Plan: [place] Barriers to Discharge: Continued Medical Work up   Patient Goals and CMS Choice        Expected Discharge Plan and Services Expected Discharge Plan: [place]   Discharge Planning Services: CM Consult   Living arrangements for the past 2 months: Single Family Home                                      Prior Living Arrangements/Services Living arrangements for the past 2 months: Single Family Home Lives with:: Siblings          Need for Family Participation in Patient Care: Yes (Comment) Care giver support system in place?: Yes (comment)   Criminal Activity/Legal Involvement Pertinent to Current Situation/Hospitalization: No - Comment as needed  Activities of Daily Living Home Assistive Devices/Equipment: Blood pressure cuff, Eyeglasses, Cane (specify quad or straight) ADL Screening (condition at time of admission) Patient's cognitive ability adequate to safely complete daily activities?: Yes Is the patient deaf or have difficulty hearing?: No Does the patient have difficulty seeing, even when wearing  glasses/contacts?: No Does the patient have difficulty concentrating, remembering, or making decisions?: Yes Patient able to express need for assistance with ADLs?: Yes Does the patient have difficulty dressing or bathing?: No Independently performs ADLs?: Yes (appropriate for developmental age) Does the patient have difficulty walking or climbing stairs?: Yes Weakness of Legs: None Weakness of Arms/Hands: None  Permission Sought/Granted Permission sought to share information with : Case Manager, Family Supports Permission granted to share information with : Yes, Verbal Permission Granted  Share Information with NAME: [facility]     Permission granted to share info w Relationship: sister  Permission granted to share info w Contact Information: [phone]  Emotional Assessment   Attitude/Demeanor/Rapport: Intubated (Following Commands or Not Following Commands)          Admission diagnosis:  Incisional hernia [K43.2] Hernia of abdominal cavity [K46.9] Patient Active Problem List   Diagnosis Date Noted   Pressure injury of skin 30/16/0109   Acute systolic heart failure ([place])    Acute respiratory failure with hypoxia ([place])    Septic shock ([place]) 04/29/2022   Aspiration pneumonia ([name]) 04/29/2022   Prolonged QT interval 04/29/2022   Essential hypertension 04/29/2022   Hernia of abdominal cavity 04/24/2022   Incisional hernia 04/23/2022   Chronic low back pain 11/20/2021   Limited mobility 11/20/2021   Ambulates with  cane 11/20/2021   Ventral hernia without obstruction or gangrene 11/15/2021   Right carpal tunnel syndrome 11/15/2021   Diabetes mellitus without complication ([place]) 28/00/3491   Insomnia 11/15/2021   Schizophrenia ([place]) 05/21/2017   Morbid obesity ([place]) 07/22/2014   Obstructive sleep apnea 04/05/2008   MIGRAINE HEADACHE 04/05/2008   Diabetes mellitus type 2 in obese ([place]) 03/21/2008   BIPOLAR DISORDER UNSPECIFIED 03/21/2008   DEPRESSION 03/21/2008    Asthma 03/21/2008   PCP:  [name], MD Pharmacy:   [facility] [address] [place] - [place] [id number] [place] [address] Phone: [phone] Fax: [phone]     Social Determinants of Health (SDOH) Interventions    Readmission Risk Interventions     No data to display

## 2022-05-31 ENCOUNTER — Inpatient Hospital Stay (HOSPITAL_COMMUNITY): Payer: Medicaid Other

## 2022-05-31 DIAGNOSIS — R6521 Severe sepsis with septic shock: Secondary | ICD-10-CM | POA: Diagnosis not present

## 2022-05-31 DIAGNOSIS — A419 Sepsis, unspecified organism: Secondary | ICD-10-CM | POA: Diagnosis not present

## 2022-05-31 DIAGNOSIS — J690 Pneumonitis due to inhalation of food and vomit: Secondary | ICD-10-CM | POA: Diagnosis not present

## 2022-05-31 LAB — GLUCOSE, CAPILLARY
Glucose-Capillary: 118 mg/dL — ABNORMAL HIGH (ref 70–99)
Glucose-Capillary: 138 mg/dL — ABNORMAL HIGH (ref 70–99)
Glucose-Capillary: 107 mg/dL — ABNORMAL HIGH (ref 70–99)
Glucose-Capillary: 106 mg/dL — ABNORMAL HIGH (ref 70–99)
Glucose-Capillary: 104 mg/dL — ABNORMAL HIGH (ref 70–99)

## 2022-05-31 LAB — CBC
HCT: 25.9 % — ABNORMAL LOW (ref 36.0–46.0)
Hemoglobin: 7.9 g/dL — ABNORMAL LOW (ref 12.0–15.0)
MCH: 27.9 pg (ref 26.0–34.0)
MCHC: 30.5 g/dL (ref 30.0–36.0)
MCV: 91.5 fL (ref 80.0–100.0)
Platelets: 518 10*3/uL — ABNORMAL HIGH (ref 150–400)
RBC: 2.83 MIL/uL — ABNORMAL LOW (ref 3.87–5.11)
RDW: 17.1 % — ABNORMAL HIGH (ref 11.5–15.5)
WBC: 8.3 10*3/uL (ref 4.0–10.5)
nRBC: 0 % (ref 0.0–0.2)

## 2022-05-31 LAB — RENAL FUNCTION PANEL
Albumin: 2 g/dL — ABNORMAL LOW (ref 3.5–5.0)
Anion gap: 8 (ref 5–15)
BUN: 43 mg/dL — ABNORMAL HIGH (ref 6–20)
CO2: 23 mmol/L (ref 22–32)
Calcium: 8.6 mg/dL — ABNORMAL LOW (ref 8.9–10.3)
Chloride: 112 mmol/L — ABNORMAL HIGH (ref 98–111)
Creatinine, Ser: 1.61 mg/dL — ABNORMAL HIGH (ref 0.44–1.00)
GFR, Estimated: 38 mL/min — ABNORMAL LOW (ref >60)
Glucose, Bld: 122 mg/dL — ABNORMAL HIGH (ref 70–99)
Phosphorus: 4.4 mg/dL (ref 2.5–4.6)
Potassium: 3.4 mmol/L — ABNORMAL LOW (ref 3.5–5.1)
Sodium: 143 mmol/L (ref 135–145)

## 2022-05-31 LAB — MAGNESIUM: Magnesium: 1.7 mg/dL (ref 1.7–2.4)

## 2022-05-31 MED ORDER — SENNA 8.6 MG PO TABS
1.0000 | ORAL_TABLET | Freq: Every day | ORAL | Status: DC
  Administered 2022-06-02 – 2022-06-13 (×10): 8.6 mg
  Filled 2022-05-31 (×11): qty 1

## 2022-05-31 MED ORDER — METOPROLOL TARTRATE 5 MG/5ML IV SOLN
2.5000 mg | Freq: Once | INTRAVENOUS | Status: AC
  Administered 2022-05-31: 2.5 mg via INTRAVENOUS
  Filled 2022-05-31: qty 5

## 2022-05-31 MED ORDER — METOCLOPRAMIDE HCL 5 MG/ML IJ SOLN
5.0000 mg | Freq: Once | INTRAMUSCULAR | Status: AC
  Administered 2022-05-31: 5 mg via INTRAVENOUS
  Filled 2022-05-31: qty 2

## 2022-05-31 MED ORDER — POTASSIUM CHLORIDE 20 MEQ PO PACK
40.0000 meq | PACK | Freq: Once | ORAL | Status: AC
  Administered 2022-05-31: 40 meq via ORAL
  Filled 2022-05-31: qty 2

## 2022-05-31 MED ORDER — BISACODYL 10 MG RE SUPP: 10.0000 mg | Freq: Every day | RECTAL | Status: DC | PRN

## 2022-05-31 MED ORDER — DILTIAZEM HCL 25 MG/5ML IV SOLN
10.0000 mg | Freq: Once | INTRAVENOUS | Status: AC
  Administered 2022-05-31: 10 mg via INTRAVENOUS
  Filled 2022-05-31: qty 5

## 2022-05-31 NOTE — Progress Notes (Signed)
30 Days Post-Op   Subjective/Chief Complaint: Sleeping comfortably overnight.  Nurse reports a good night.    Objective: Vital signs in last 24 hours: Temp:  [98.2 F (36.8 C)-98.9 F (37.2 C)] 98.9 F (37.2 C) (11/03 0325) Pulse Rate:  [96-110] 104 (11/03 0325) Resp:  [16-25] 22 (11/03 0325) BP: (125-156)/(86-108) 125/86 (11/03 0325) SpO2:  [93 %-100 %] 98 % (11/03 0325) FiO2 (%):  [28 %] 28 % (11/03 0255) Weight:  [81.6 kg] 81.6 kg (11/03 0515) Last BM Date : 05/30/22  Intake/Output from previous day: 11/02 0701 - 11/03 0700 In: 1319.6 [I.V.:417.9; NG/GT:901.7] Out: 1560 [Urine:1500; Emesis/NG output:60] Intake/Output this shift: No intake/output data recorded.  Trach in place Abd - soft, RUQ tenderness improved  Lab Results:  Recent Labs    05/30/22 0028 05/31/22 0323  WBC 10.9* 8.3  HGB 8.4* 7.9*  HCT 28.0* 25.9*  PLT 591* 518*    BMET Recent Labs    05/30/22 0028 05/31/22 0323  NA 145 143  K 3.4* 3.4*  CL 113* 112*  CO2 24 23  GLUCOSE 110* 122*  BUN 40* 43*  CREATININE 1.74* 1.61*  CALCIUM 8.8* 8.6*    PT/INR No results for input(s): "LABPROT", "INR" in the last 72 hours. ABG No results for input(s): "PHART", "HCO3" in the last 72 hours.  Invalid input(s): "PCO2", "PO2"  Studies/Results: CT ABDOMEN PELVIS WO CONTRAST  Result Date: 05/29/2022 CLINICAL DATA:  Right upper quadrant abdominal pain. Concern for bowel obstruction. EXAM: CT ABDOMEN AND PELVIS WITHOUT CONTRAST TECHNIQUE: Multidetector CT imaging of the abdomen and pelvis was performed following the standard protocol without IV contrast. RADIATION DOSE REDUCTION: This exam was performed according to the departmental dose-optimization program which includes automated exposure control, adjustment of the mA and/or kV according to patient size and/or use of iterative reconstruction technique. COMPARISON:  CT abdomen pelvis dated 05/06/2022. FINDINGS: Evaluation of this exam is limited in the  absence of intravenous contrast. Lower chest: Bilateral lower lobe, right greater than left clusters of airspace opacities most consistent with pneumonia. Clinical correlation is recommended. No intra-abdominal free air or free fluid. Hepatobiliary: The liver is unremarkable. No biliary dilatation. The gallbladder is unremarkable. Pancreas: Unremarkable. No pancreatic ductal dilatation or surrounding inflammatory changes. Spleen: Normal in size without focal abnormality. Adrenals/Urinary Tract: The adrenal glands are unremarkable. The kidneys, visualized ureters, and urinary bladder appear unremarkable. Stomach/Bowel: Feeding tube with tip in the proximal jejunum in the left upper abdomen. Postsurgical changes of bowel with anastomotic suture in the anterior pelvis. There is no bowel obstruction or active inflammation. The appendix is not visualized with certainty. No inflammatory changes identified in the right lower quadrant. Vascular/Lymphatic: Mild atherosclerotic calcification of the aorta. The IVC is unremarkable. No portal venous gas. There is no adenopathy. Reproductive: Hysterectomy.  No adnexal masses. Other: Midline vertical anterior abdominal wall incisional scar. Musculoskeletal: No acute or significant osseous findings. IMPRESSION: 1. No acute intra-abdominal or pelvic pathology. No bowel obstruction. 2. Bilateral lower lobe, right greater than left clusters of airspace opacities most consistent with pneumonia. 3.  Aortic Atherosclerosis (ICD10-I70.0). Electronically Signed   By: Anner Crete M.D.   On: 05/29/2022 19:43   IR Removal Tun Cv Cath W/O FL  Result Date: 05/29/2022 INDICATION: Acute renal failure now with recovered kidney function. Request for removal of tunneled hemodialysis catheter. It was initially placed by Dr. Pascal Lux on May 15, 2022. EXAM: REMOVAL OF TUNNELED HEMODIALYSIS CATHETER MEDICATIONS: 1% lidocaine 4 mL COMPLICATIONS: None immediate. PROCEDURE: Informed written  consent was obtained from the patient following an explanation of the procedure, risks, benefits and alternatives to treatment. A time out was performed prior to the initiation of the procedure. Maximal barrier sterile technique was utilized including sterile gloves, sterile drape, and hand hygiene. Betadine was used to prep the patient's right neck, chest and existing catheter. 1% lidocaine was injected around the catheter and the subcutaneous tunnel. The catheter was easily removed using only gentle traction. The catheter was removed intact. Hemostasis was obtained with manual compression. A dressing was placed. The patient tolerated the procedure well without immediate post procedural complication. IMPRESSION: Successful removal of tunneled dialysis catheter. Procedure performed by: Gareth Eagle, PA-C Electronically Signed   By: Markus Daft M.D.   On: 05/29/2022 17:36   DG CHEST PORT 1 VIEW  Result Date: 05/29/2022 CLINICAL DATA:  Vomited 2 feedings earlier today. EXAM: PORTABLE CHEST 1 VIEW COMPARISON:  05/27/2022 FINDINGS: The feeding tube appears stable. The cardiac silhouette, mediastinal and hilar contours are unchanged. Patchy infiltrates appears stable. No obvious new/acute pulmonary findings. IMPRESSION: 1. Stable feeding tube. 2. Stable patchy infiltrates. Electronically Signed   By: Marijo Sanes M.D.   On: 05/29/2022 16:29    Anti-infectives: Anti-infectives (From admission, onward)    Start     Dose/Rate Route Frequency Ordered Stop   05/15/22 1445  ceFAZolin (ANCEF) IVPB 2g/100 mL premix       Note to Pharmacy: To be given in IR   2 g 200 mL/hr over 30 Minutes Intravenous  Once 05/15/22 1352 05/15/22 1616   05/15/22 1230  ceFAZolin (ANCEF) powder 1 g  Status:  Discontinued        1 g Other To Surgery 05/15/22 1140 05/15/22 1239   05/15/22 1230  ceFAZolin (ANCEF) IVPB 2g/100 mL premix        2 g 200 mL/hr over 30 Minutes Intravenous To Radiology 05/15/22 1140 05/15/22 1140   05/15/22  0000  ceFAZolin (ANCEF) IVPB 2g/100 mL premix        2 g 200 mL/hr over 30 Minutes Intravenous To Radiology 05/14/22 1308 05/15/22 0005   05/13/22 2134  meropenem (MERREM) 500 mg in sodium chloride 0.9 % 100 mL IVPB        500 mg 200 mL/hr over 30 Minutes Intravenous Every 24 hours 05/13/22 0815 05/17/22 0700   05/13/22 1000  meropenem (MERREM) 500 mg in sodium chloride 0.9 % 100 mL IVPB  Status:  Discontinued        500 mg 200 mL/hr over 30 Minutes Intravenous Every 12 hours 05/12/22 0940 05/12/22 0946   05/12/22 2200  meropenem (MERREM) 500 mg in sodium chloride 0.9 % 100 mL IVPB  Status:  Discontinued        500 mg 200 mL/hr over 30 Minutes Intravenous Every 12 hours 05/12/22 0946 05/13/22 0815   05/12/22 2000  meropenem (MERREM) 1 g in sodium chloride 0.9 % 100 mL IVPB  Status:  Discontinued        1 g 200 mL/hr over 30 Minutes Intravenous  Once 05/12/22 0940 05/12/22 0946   05/06/22 1530  meropenem (MERREM) 1 g in sodium chloride 0.9 % 100 mL IVPB  Status:  Discontinued        1 g 200 mL/hr over 30 Minutes Intravenous Every 8 hours 05/06/22 1438 05/12/22 0940   05/04/22 1000  vancomycin (VANCOCIN) IVPB 1000 mg/200 mL premix  Status:  Discontinued        1,000 mg 200 mL/hr over 60  Minutes Intravenous Every 24 hours 05/03/22 0802 05/08/22 0913   05/03/22 0845  vancomycin (VANCOREADY) IVPB 2000 mg/400 mL        2,000 mg 200 mL/hr over 120 Minutes Intravenous  Once 05/03/22 0751 05/03/22 1114   05/01/22 1800  piperacillin-tazobactam (ZOSYN) IVPB 3.375 g  Status:  Discontinued        3.375 g 12.5 mL/hr over 240 Minutes Intravenous Every 6 hours 05/01/22 1426 05/01/22 1427   05/01/22 1515  piperacillin-tazobactam (ZOSYN) IVPB 3.375 g  Status:  Discontinued        3.375 g 100 mL/hr over 30 Minutes Intravenous Every 6 hours 05/01/22 1428 05/06/22 1438   04/30/22 1400  piperacillin-tazobactam (ZOSYN) IVPB 2.25 g  Status:  Discontinued        2.25 g 100 mL/hr over 30 Minutes Intravenous  Every 8 hours 04/30/22 1325 05/01/22 1426   04/29/22 1530  piperacillin-tazobactam (ZOSYN) IVPB 3.375 g  Status:  Discontinued        3.375 g 12.5 mL/hr over 240 Minutes Intravenous Every 8 hours 04/29/22 1433 04/30/22 1325   04/29/22 1145  metroNIDAZOLE (FLAGYL) IVPB 500 mg  Status:  Discontinued        500 mg 100 mL/hr over 60 Minutes Intravenous Every 12 hours 04/29/22 1047 04/29/22 1433   04/29/22 1145  vancomycin (VANCOCIN) IVPB 1000 mg/200 mL premix  Status:  Discontinued        1,000 mg 200 mL/hr over 60 Minutes Intravenous  Once 04/29/22 1047 04/29/22 1055   04/29/22 1145  vancomycin (VANCOREADY) IVPB 1500 mg/300 mL  Status:  Discontinued        1,500 mg 150 mL/hr over 120 Minutes Intravenous Every 24 hours 04/29/22 1055 04/29/22 1357   04/29/22 1045  ceFEPIme (MAXIPIME) 2 g in sodium chloride 0.9 % 100 mL IVPB  Status:  Discontinued        2 g 200 mL/hr over 30 Minutes Intravenous Every 8 hours 04/29/22 0954 04/29/22 1433   04/23/22 0730  ceFAZolin (ANCEF) IVPB 2g/100 mL premix        2 g 200 mL/hr over 30 Minutes Intravenous On call to O.R. 04/23/22 0726 04/23/22 0828       Assessment/Plan: Patricia [name] is s/p robotic incisional hernia repair on 04/23/22 -Postop ileus complicated by aspiration: Ileus resolved, tube feeds at goal - Inflammatory enteritis improved, CT abd/pel 05/29/22 normal - Encouraged her not to refuse medications. - avoid free water boluses, 300 mL bolus is likely too much at once for her duodenal feeding tube, Sodium improving today - WBC normalized   Acute metabolic encephalopathy superimposed on hx bipolar disorder, schizophrenia   ICU delirium- improved now off all IV infusions. Acute biventricular HFrEF (Etiology sepsis, stress induced CM. Has clean coronaries) Cardiogenic shock resolved, EF improved.  Acute hypoxic respiratory failure Aspiration PNA Pulm edema   RLL VAP  AKI with oliguria; no baseline CKD   - appears to have some renal  recovery Anemia, acute on chronic  DM2 with hyperglycemia  Severe protein calorie malnutrition  Pressure injury lip - improving  Deconditioning    Appreciate [facility] care with multiple issues   Needs therapy and recouperation     Dispo - Remain in stepdown for trach care, case Management consult for placement - difficult with many needs, insurance limitation.  Would be great if patient is accepted to inpatient rehab, but not particpating well with physical therapy at this point.    LOS: 37 days    Patricia [name]  Patricia [name] 05/31/2022

## 2022-05-31 NOTE — Progress Notes (Addendum)
1615: Pt went into SVT with heart rate at 140 bpm. Provider notified and Cardizem 10 mg IV push and an EKG were ordered.   1635: Heart rate dropped to the 130's and O2 went down to 80"s. Respiratory was called and pt was suctioned and O2 levels returned to the 90's. Pts heart rate still remained in the 140's and provider was notified. New orders were given.

## 2022-05-31 NOTE — Progress Notes (Signed)
PROGRESS NOTE    Patricia [name]  MHD:[id number] DOB: [birth year] DOA: 04/23/2022 PCP: [name], MD   Brief Narrative:   51 year old with history of DM2, depression, migraine, asthma, schizophrenia, HTN, HLD presented to the hospital for exertional hernia, robotic hernia repair on 04/23/2022 with mesh who developed postop ileus and is on TNA.  Also patient developed vomiting and hypotension after NG tube came out on 04/29/2022 with concerns of pneumonia which was treated with antibiotics and IV fluids.  During that time patient also required Levophed and eventually intubated on 10/3 for worsening metabolic acidosis complicated by acute kidney injury.  Echocardiogram showed depressed EF of 30-35% with elevated troponin.  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  Patient was seen by CHF team started on dopamine and CRRT as well.  Patient was again reintubated due to neuromuscular weakness on 10/8.  Eventually extubated on 10/14 to BiPAP and trach was placed due to weakness.  Tube feeds held due to nausea vomiting on 10/30.  Hospital course complicated by hypernatremia, started D5 water   Assessment & Plan:  Principal Problem:   Septic shock ([place]) Active Problems:   Hernia of abdominal cavity   Aspiration pneumonia (HCC)   Prolonged QT interval   Diabetes mellitus type 2 in obese (HCC)   BIPOLAR DISORDER UNSPECIFIED   Morbid obesity (HCC)   Chronic low back pain   Essential hypertension   Acute systolic heart failure (HCC)   Acute respiratory failure with hypoxia (HCC)   Pressure injury of skin     Assessment and Plan:  * Septic shock ([place]) secondary to aspiration pneumonia, right lower lobe Acute respiratory failure with prolonged mechanical ventilation status post tracheostomy  -Trach management by pulmonary team. Antibiotics-none; last day was 10/19 - Bronchodilators as needed, supportive care.  Out of bed to chair -CT abdomen pelvis with  significant bibasilar opacities, with concerns of pneumonia, but reviewing previous imaging even dating back to CT chest 04/30/2022 she does appears to be having some these persistent opacities at the bases, so for now I will hold on initiating any antibiotics especially with no fever, or increased trach secretions, and leukocytosis appears stable, certainly low threshold to start antibiotics if any signs of infection or pneumonia or worsening respiratory status develop. -Does report nausea today, but no BM overnight, will give as needed suppository to see if this improves with BM.  Status post hernia repair complicated by postop ileus/inflammatory enteritis Intermittent nausea vomiting -Management by general surgery.  She is working with speech and swallow.  Maintain n.p.o. status, PMV.  Tube feeds -Management per primary surgical team, repeat CT abdomen with no acute abdominal findings. -No nausea or vomiting over last 24 hours, will start on as needed laxatives   Hypernatremia - Sodium is improving, on D5W for now, hopefully can transition to free water via tube she is able to tolerate her tube feeds at current rate without nausea or vomiting.    Right knee pain - Unclear etiology, no evidence of trauma.  Negative x-ray  Acute kidney injury, improving -Initially required CRRT and HD.  Now improving without dialysis  Biventricular failure, reduced EF - Last seen by CHF team 10/16.  Previously tried Bumex with minimal to no urine output.  At this point patient is on dialysis and has been well compensated.  Plans to follow-up outpatient. LHC shows clean coronaries.   Sinus tachycardia; improved.  - Now on metoprolol 50 mg twice daily.  IV as needed  Anemia of chronic disease - From underlying multiple medical issues.  Transfuse as necessary and attempt to keep hemoglobin greater than 8  Essential hypertension -Currently on metoprolol twice daily -We will add as needed  hydralazine  Chronic low back pain -As needed pain control  Morbid obesity (HCC) -Body mass index is 41.37 kg/m..  Currently on tube feeds -Follow-up outpatient, weight loss diet and exercise  BIPOLAR DISORDER UNSPECIFIED -On Klonopin.  Should slowly resume home meds as appropriate  Diabetes mellitus type 2 in obese (HCC) -Sliding scale and Accu-Cheks.  Pressure area on the lip in the setting of ET tube placement which was not present prior to admission.  Routine wound care.  PT-CIR      DVT prophylaxis: Subcu heparin Code Status: Full code Family Communication:  none at bedside  Final dispo per general surgery  Nutritional status    Signs/Symptoms: NPO status (with need for MBS per SLP)  Interventions: Refer to RD note for recommendations, Tube feeding, MVI  Body mass index is 30.9 kg/m.  Pressure Injury 05/22/22 Heel Right Stage 1 -  Intact skin with non-blanchable redness of a localized area usually over a bony prominence. (Active)  05/22/22 1157  Location: Heel  Location Orientation: Right  Staging: Stage 1 -  Intact skin with non-blanchable redness of a localized area usually over a bony prominence.  Wound Description (Comments):   Present on Admission: No     Subjective:  She does report some nausea, no vomiting, no abdominal pain today, no BM overnight. Examination:  Awake Alert, negative, trach with PMV Symmetrical Chest wall movement, Good air movement bilaterally, CTAB RRR,No Gallops,Rubs or new Murmurs, No Parasternal Heave +ve B.Sounds, Abd Soft, No tenderness, No rebound - guarding or rigidity. Bilateral Unna boots   Tracheostomy in place Tube feeding(CORTRAK) in place  Objective: Vitals:   05/31/22 0325 05/31/22 0515 05/31/22 0740 05/31/22 0747  BP: 125/86  (!) 134/98 (!) 134/98  Pulse: (!) 104  (!) 114 (!) 114  Resp: (!) 22  (!) 26 (!) 25  Temp: 98.9 F (37.2 C)  98.8 F (37.1 C)   TempSrc: Oral  Oral   SpO2: 98%  98%    Weight:  81.6 kg    Height:        Intake/Output Summary (Last 24 hours) at 05/31/2022 1018 Last data filed at 05/31/2022 0925 Gross per 24 hour  Intake 1638.09 ml  Output 1960 ml  Net -321.91 ml   Filed Weights   05/28/22 0500 05/30/22 0619 05/31/22 0515  Weight: 93.7 kg 79.6 kg 81.6 kg     Data Reviewed:   CBC: Recent Labs  Lab 05/25/22 0020 05/28/22 0024 05/29/22 0644 05/30/22 0028 05/31/22 0323  WBC 6.6 10.3 15.9* 10.9* 8.3  HGB 7.6* 8.3* 9.2* 8.4* 7.9*  HCT 24.6* 26.9* 30.7* 28.0* 25.9*  MCV 88.2 91.2 92.2 91.8 91.5  PLT 462* 598* 605* 591* 585*   Basic Metabolic Panel: Recent Labs  Lab 05/27/22 0019 05/27/22 1227 05/28/22 0024 05/28/22 1813 05/29/22 0635 05/29/22 0644 05/30/22 0028 05/31/22 0323  NA 151* 150* 153* 149*  --  150* 145 143  K 4.0 4.2 4.2 4.8  --  5.3* 3.4* 3.4*  CL 116* 118* 120* 117*  --  114* 113* 112*  CO2 '28 24 27 24  '$ --  '22 24 23  '$ GLUCOSE 137* 221* 120* 123*  --  114* 110* 122*  BUN 93* 92* 83* 65*  --  56* 40* 43*  CREATININE 2.65*  2.55* 2.44* 2.11*  --  1.98* 1.74* 1.61*  CALCIUM 9.0 9.1 9.1 9.1  --  9.3 8.8* 8.6*  MG 2.6*  --  2.5*  --  2.1  --  2.0 1.7  PHOS 4.1 3.7 4.2  --   --  4.9* 4.7* 4.4   GFR: Estimated Creatinine Clearance: 42.3 mL/min (A) (by C-G formula based on SCr of 1.61 mg/dL (H)). Liver Function Tests: Recent Labs  Lab 05/27/22 1227 05/28/22 0024 05/29/22 0644 05/30/22 0028 05/31/22 0323  AST  --   --  113*  --   --   ALT  --   --  197*  --   --   ALKPHOS  --   --  115  --   --   BILITOT  --   --  0.4  --   --   PROT  --   --  8.8*  --   --   ALBUMIN 2.2* 2.1* 2.4*  2.4* 2.1* 2.0*   No results for input(s): "LIPASE", "AMYLASE" in the last 168 hours. No results for input(s): "AMMONIA" in the last 168 hours. Coagulation Profile: No results for input(s): "INR", "PROTIME" in the last 168 hours. Cardiac Enzymes: No results for input(s): "CKTOTAL", "CKMB", "CKMBINDEX", "TROPONINI" in the last 168  hours. BNP (last 3 results) No results for input(s): "PROBNP" in the last 8760 hours. HbA1C: No results for input(s): "HGBA1C" in the last 72 hours. CBG: Recent Labs  Lab 05/30/22 1652 05/30/22 1944 05/30/22 2340 05/31/22 0324 05/31/22 0639  GLUCAP 120* 116* 114* 118* 138*   Lipid Profile: No results for input(s): "CHOL", "HDL", "LDLCALC", "TRIG", "CHOLHDL", "LDLDIRECT" in the last 72 hours. Thyroid Function Tests: No results for input(s): "TSH", "T4TOTAL", "FREET4", "T3FREE", "THYROIDAB" in the last 72 hours. Anemia Panel: No results for input(s): "VITAMINB12", "FOLATE", "FERRITIN", "TIBC", "IRON", "RETICCTPCT" in the last 72 hours. Sepsis Labs: No results for input(s): "PROCALCITON", "LATICACIDVEN" in the last 168 hours.  No results found for this or any previous visit (from the past 240 hour(s)).       Radiology Studies: CT ABDOMEN PELVIS WO CONTRAST  Result Date: 05/29/2022 CLINICAL DATA:  Right upper quadrant abdominal pain. Concern for bowel obstruction. EXAM: CT ABDOMEN AND PELVIS WITHOUT CONTRAST TECHNIQUE: Multidetector CT imaging of the abdomen and pelvis was performed following the standard protocol without IV contrast. RADIATION DOSE REDUCTION: This exam was performed according to the departmental dose-optimization program which includes automated exposure control, adjustment of the mA and/or kV according to patient size and/or use of iterative reconstruction technique. COMPARISON:  CT abdomen pelvis dated 05/06/2022. FINDINGS: Evaluation of this exam is limited in the absence of intravenous contrast. Lower chest: Bilateral lower lobe, right greater than left clusters of airspace opacities most consistent with pneumonia. Clinical correlation is recommended. No intra-abdominal free air or free fluid. Hepatobiliary: The liver is unremarkable. No biliary dilatation. The gallbladder is unremarkable. Pancreas: Unremarkable. No pancreatic ductal dilatation or surrounding  inflammatory changes. Spleen: Normal in size without focal abnormality. Adrenals/Urinary Tract: The adrenal glands are unremarkable. The kidneys, visualized ureters, and urinary bladder appear unremarkable. Stomach/Bowel: Feeding tube with tip in the proximal jejunum in the left upper abdomen. Postsurgical changes of bowel with anastomotic suture in the anterior pelvis. There is no bowel obstruction or active inflammation. The appendix is not visualized with certainty. No inflammatory changes identified in the right lower quadrant. Vascular/Lymphatic: Mild atherosclerotic calcification of the aorta. The IVC is unremarkable. No portal venous gas. There is no  adenopathy. Reproductive: Hysterectomy.  No adnexal masses. Other: Midline vertical anterior abdominal wall incisional scar. Musculoskeletal: No acute or significant osseous findings. IMPRESSION: 1. No acute intra-abdominal or pelvic pathology. No bowel obstruction. 2. Bilateral lower lobe, right greater than left clusters of airspace opacities most consistent with pneumonia. 3.  Aortic Atherosclerosis (ICD10-I70.0). Electronically Signed   By: Anner Crete M.D.   On: 05/29/2022 19:43   IR Removal Tun Cv Cath W/O FL  Result Date: 05/29/2022 INDICATION: Acute renal failure now with recovered kidney function. Request for removal of tunneled hemodialysis catheter. It was initially placed by Dr. Pascal Lux on May 15, 2022. EXAM: REMOVAL OF TUNNELED HEMODIALYSIS CATHETER MEDICATIONS: 1% lidocaine 4 mL COMPLICATIONS: None immediate. PROCEDURE: Informed written consent was obtained from the patient following an explanation of the procedure, risks, benefits and alternatives to treatment. A time out was performed prior to the initiation of the procedure. Maximal barrier sterile technique was utilized including sterile gloves, sterile drape, and hand hygiene. Betadine was used to prep the patient's right neck, chest and existing catheter. 1% lidocaine was injected  around the catheter and the subcutaneous tunnel. The catheter was easily removed using only gentle traction. The catheter was removed intact. Hemostasis was obtained with manual compression. A dressing was placed. The patient tolerated the procedure well without immediate post procedural complication. IMPRESSION: Successful removal of tunneled dialysis catheter. Procedure performed by: Gareth Eagle, PA-C Electronically Signed   By: Markus Daft M.D.   On: 05/29/2022 17:36   DG CHEST PORT 1 VIEW  Result Date: 05/29/2022 CLINICAL DATA:  Vomited 2 feedings earlier today. EXAM: PORTABLE CHEST 1 VIEW COMPARISON:  05/27/2022 FINDINGS: The feeding tube appears stable. The cardiac silhouette, mediastinal and hilar contours are unchanged. Patchy infiltrates appears stable. No obvious new/acute pulmonary findings. IMPRESSION: 1. Stable feeding tube. 2. Stable patchy infiltrates. Electronically Signed   By: Marijo Sanes M.D.   On: 05/29/2022 16:29        Scheduled Meds:  clonazePAM  0.5 mg Per Tube QHS   copper  2 mg Per Tube Daily   diclofenac Sodium  2 g Topical QID   feeding supplement (PROSource TF20)  60 mL Per Tube Daily   fiber  1 packet Per Tube BID   heparin injection (subcutaneous)  5,000 Units Subcutaneous Q8H   insulin aspart  0-20 Units Subcutaneous Q6H   insulin aspart  4 Units Subcutaneous Q6H   metoprolol tartrate  50 mg Per Tube BID   multivitamin with minerals  1 tablet Per Tube Daily   mouth rinse  15 mL Mouth Rinse 4 times per day   pantoprazole (PROTONIX) IV  40 mg Intravenous Daily   polyethylene glycol  17 g Per Tube Daily   QUEtiapine  50 mg Per Tube QHS   senna  1 tablet Per Tube Daily   sodium chloride flush  10-40 mL Intracatheter Q12H   zinc sulfate  220 mg Oral Daily   Continuous Infusions:  sodium chloride 10 mL/hr at 05/20/22 1900   albumin human     feeding supplement (PEPTAMEN 1.5 CAL) 55 mL/hr at 05/31/22 0700   methocarbamol (ROBAXIN) IV       LOS: 17 days       Phillips Climes, MD [facility]  If 7PM-7AM, please contact night-coverage  05/31/2022, 10:18 AM

## 2022-05-31 NOTE — Progress Notes (Signed)
   05/31/22 1611  Assess: MEWS Score  Temp 98.4 F (36.9 C)  BP 113/79  MAP (mmHg) 90  Pulse Rate (!) 128  ECG Heart Rate (!) 128  Resp (!) 35  Level of Consciousness Alert  SpO2 92 %  O2 Device Tracheostomy Collar  Assess: MEWS Score  MEWS Temp 0  MEWS Systolic 0  MEWS Pulse 2  MEWS RR 2  MEWS LOC 0  MEWS Score 4  MEWS Score Color Red  Assess: if the MEWS score is Yellow or Red  Were vital signs taken at a resting state? Yes  Focused Assessment Change from prior assessment (see assessment flowsheet)  Does the patient meet 2 or more of the SIRS criteria? Yes  Does the patient have a confirmed or suspected source of infection? No  Provider and Rapid Response Notified? Yes  MEWS guidelines implemented *See Row Information* No, vital signs rechecked  Treat  MEWS Interventions Escalated (See documentation below);Administered scheduled meds/treatments  Pain Scale 0-10  Pain Score 10  Take Vital Signs  Increase Vital Sign Frequency  Red: Q 1hr X 4 then Q 4hr X 4, if remains red, continue Q 4hrs  Escalate  MEWS: Escalate Red: discuss with charge nurse/RN and provider, consider discussing with RRT  Notify: Charge Nurse/RN  Name of Charge Nurse/RN Notified April Cooper, RN  Date Charge Nurse/RN Notified 05/31/22  Time Charge Nurse/RN Notified 5  Notify: Provider  Provider Name/Title Dr. [facility]  Date Provider Notified 05/31/22  Time Provider Notified 3419  Method of Notification Page  Notification Reason Change in status  Provider response See new orders  Date of Provider Response 05/31/22  Time of Provider Response 1615  Document  Progress note created (see row info) Yes  Assess: SIRS CRITERIA  SIRS Temperature  0  SIRS Pulse 1  SIRS Respirations  1  SIRS WBC 1  SIRS Score Sum  3

## 2022-05-31 NOTE — Progress Notes (Signed)
Called to patient room for low sat. Upon arrival SPO2 was 87% increased WOB. HR 140s. Inner cannula was changed. Patient was suctioned x3 and received a moderate amount of thick yellow secretions. Patient SPO2 was 92%. Increased oxygen from 28% to 35% and SPO2 is now 95% and HR still elevated. WOB is better. RN calling MD.

## 2022-05-31 NOTE — Progress Notes (Addendum)
Modified Barium Swallow Progress Note  Patient Details  Name: Patricia [name] MRN: [id number] Date of Birth: 08/22/1970  Today's Date: 05/31/2022  Modified Barium Swallow completed.  Full report located under Chart Review in the Imaging Section.  Brief recommendations include the following:  Clinical Impression  Ms. Fredlund was feeling nauseous immediately prior to MBS, so PO trials were limited.  She demonstrated SILENT and intermittent aspiration of all tested consistencies (thin, nectar thick, and puree) due to delayed onset of the swallow response.  All materials reached the level of the pyriforms and generally spilled into the larynx before laryngeal vestibule closure could be achieved.  Once the swallow was initiated, closure was complete, but intervention will be needed to address timing/swallowing onset.  In addition, pt demonstrated deficits in bolus cohesion and control for all consistencies.  Recommend that she remain NPO for now: SLP will offer therapeutic trials of POs during swallowing treatment.  D/W pt after study; she was disappointed; offered encouragement and she agreed to continue to work toward swallowing goals.  D/W RN.   Swallow Evaluation Recommendations       SLP Diet Recommendations: NPO- may continue to have occasional ice chips when PMV is in place and after oral care.                           Other Recommendations: Have oral suction available; oral care QD  Zamya Culhane L. Tivis Ringer, MA CCC/SLP Clinical Specialist - Acute Care SLP Acute Rehabilitation Services Office number [phone]   [name] 05/31/2022,1:59 PM

## 2022-05-31 NOTE — TOC Progression Note (Signed)
Transition of Care [facility]) - Progression Note    Patient Details  Name: Patricia [name] MRN: [id number] Date of Birth: [birth year]  Transition of Care [facility]) CM/SW Contact  Reece Agar, [place] Phone Number: 05/31/2022, 8:43 AM  Clinical Narrative:    Pt has no bed offers,[facility] in [place] is considering pending further information on pt. CSW contacted admissions, no answer CSW left a message and will continue to follow.    Expected Discharge Plan: [place] Barriers to Discharge: Continued Medical Work up  Expected Discharge Plan and Services Expected Discharge Plan: [place]   Discharge Planning Services: CM Consult   Living arrangements for the past 2 months: Single Family Home                                       Social Determinants of Health (SDOH) Interventions    Readmission Risk Interventions     No data to display

## 2022-06-01 ENCOUNTER — Inpatient Hospital Stay (HOSPITAL_COMMUNITY): Payer: Medicaid Other

## 2022-06-01 DIAGNOSIS — R6521 Severe sepsis with septic shock: Secondary | ICD-10-CM | POA: Diagnosis not present

## 2022-06-01 DIAGNOSIS — A419 Sepsis, unspecified organism: Secondary | ICD-10-CM | POA: Diagnosis not present

## 2022-06-01 DIAGNOSIS — J690 Pneumonitis due to inhalation of food and vomit: Secondary | ICD-10-CM | POA: Diagnosis not present

## 2022-06-01 LAB — GLUCOSE, CAPILLARY
Glucose-Capillary: 110 mg/dL — ABNORMAL HIGH (ref 70–99)
Glucose-Capillary: 97 mg/dL (ref 70–99)
Glucose-Capillary: 98 mg/dL (ref 70–99)
Glucose-Capillary: 110 mg/dL — ABNORMAL HIGH (ref 70–99)

## 2022-06-01 LAB — RENAL FUNCTION PANEL
Albumin: 2.3 g/dL — ABNORMAL LOW (ref 3.5–5.0)
Anion gap: 10 (ref 5–15)
BUN: 43 mg/dL — ABNORMAL HIGH (ref 6–20)
CO2: 20 mmol/L — ABNORMAL LOW (ref 22–32)
Calcium: 8.7 mg/dL — ABNORMAL LOW (ref 8.9–10.3)
Chloride: 117 mmol/L — ABNORMAL HIGH (ref 98–111)
Creatinine, Ser: 2.03 mg/dL — ABNORMAL HIGH (ref 0.44–1.00)
GFR, Estimated: 29 mL/min — ABNORMAL LOW
Glucose, Bld: 107 mg/dL — ABNORMAL HIGH (ref 70–99)
Phosphorus: 4.4 mg/dL (ref 2.5–4.6)
Potassium: 4.2 mmol/L (ref 3.5–5.1)
Sodium: 147 mmol/L — ABNORMAL HIGH (ref 135–145)

## 2022-06-01 LAB — MAGNESIUM: Magnesium: 1.8 mg/dL (ref 1.7–2.4)

## 2022-06-01 MED ORDER — LACTATED RINGERS IV BOLUS
1000.0000 mL | Freq: Once | INTRAVENOUS | Status: AC
  Administered 2022-06-01: 1000 mL via INTRAVENOUS

## 2022-06-01 MED ORDER — GUAIFENESIN 100 MG/5ML PO LIQD
10.0000 mL | ORAL | Status: DC | PRN
  Administered 2022-06-01 – 2022-06-12 (×7): 10 mL
  Filled 2022-06-01 (×7): qty 10

## 2022-06-01 MED ORDER — HYDROMORPHONE HCL 1 MG/ML IJ SOLN
0.5000 mg | INTRAMUSCULAR | Status: DC | PRN
  Administered 2022-06-01 – 2022-06-04 (×17): 0.5 mg via INTRAVENOUS
  Filled 2022-06-01 (×17): qty 0.5

## 2022-06-01 MED ORDER — SODIUM CHLORIDE 0.9 % IV SOLN
2.0000 g | Freq: Two times a day (BID) | INTRAVENOUS | Status: DC
  Administered 2022-06-01 – 2022-06-04 (×7): 2 g via INTRAVENOUS
  Filled 2022-06-01 (×7): qty 12.5

## 2022-06-01 NOTE — Progress Notes (Signed)
Pharmacy Antibiotic Note  Patricia [name] is a 51 y.o. female admitted on 04/23/2022 with pneumonia.  Pharmacy has been consulted for cefepime dosing. WBC WNL on 11/3, afebrile on APAP, on 8L O2. CrCl is 32.7 mL/min.  Plan: Cefepime 2g every 12 hours Monitor renal function to adjust dosing  Height: '5\' 4"'$  (162.6 cm) Weight: 77.8 kg (171 lb 8.3 oz) IBW/kg (Calculated) : 54.7  Temp (24hrs), Avg:98.2 F (36.8 C), Min:97.9 F (36.6 C), Max:98.4 F (36.9 C)  Recent Labs  Lab 05/28/22 0024 05/28/22 1813 05/29/22 0644 05/30/22 0028 05/31/22 0323 06/01/22 0220  WBC 10.3  --  15.9* 10.9* 8.3  --   CREATININE 2.44* 2.11* 1.98* 1.74* 1.61* 2.03*    Estimated Creatinine Clearance: 32.7 mL/min (A) (by C-G formula based on SCr of 2.03 mg/dL (H)).    Allergies  Allergen Reactions   Iodinated Contrast Media Shortness Of Breath, Other (See Comments) and Cough    Throat itching    Peanut-Containing Drug Products Anaphylaxis    Peanut Oil Only.  Can eat peanuts with no problems   Oxycodone-Acetaminophen Itching    Antimicrobials this admission: Cefepime 10/2>>10/2, 11/4>> Vanc 10/2>>10/2; 10/6 >10/11 Zosyn 10/3>>10/9 Merrem 10/9 >>10/19  Microbiology results:  10/3 Bcx x 2: Neg 10/2 BCx - neg 10/2 UCx - neg 10/9 TA - neg 10/14 TA - neg  Thank you for allowing pharmacy to be a part of this patient's care.  Ursula Beath 06/01/2022 8:20 AM

## 2022-06-01 NOTE — Plan of Care (Signed)
  Problem: Education: Goal: Knowledge of General Education information will improve Description: Including pain rating scale, medication(s)/side effects and non-pharmacologic comfort measures Outcome: Progressing   Problem: Health Behavior/Discharge Planning: Goal: Ability to manage health-related needs will improve Outcome: Progressing   Problem: Clinical Measurements: Goal: Ability to maintain clinical measurements within normal limits will improve Outcome: Progressing Goal: Will remain free from infection Outcome: Progressing Goal: Diagnostic test results will improve Outcome: Progressing Goal: Respiratory complications will improve Outcome: Progressing Goal: Cardiovascular complication will be avoided Outcome: Progressing   Problem: Activity: Goal: Risk for activity intolerance will decrease Outcome: Progressing   Problem: Nutrition: Goal: Adequate nutrition will be maintained Outcome: Progressing   Problem: Coping: Goal: Level of anxiety will decrease Outcome: Progressing   Problem: Elimination: Goal: Will not experience complications related to bowel motility Outcome: Progressing Goal: Will not experience complications related to urinary retention Outcome: Progressing   Problem: Pain Managment: Goal: General experience of comfort will improve Outcome: Progressing   Problem: Safety: Goal: Ability to remain free from injury will improve Outcome: Progressing   Problem: Skin Integrity: Goal: Risk for impaired skin integrity will decrease Outcome: Progressing   Problem: Fluid Volume: Goal: Hemodynamic stability will improve Outcome: Progressing   Problem: Clinical Measurements: Goal: Diagnostic test results will improve Outcome: Progressing Goal: Signs and symptoms of infection will decrease Outcome: Progressing   Problem: Respiratory: Goal: Ability to maintain adequate ventilation will improve Outcome: Progressing   Problem: Education: Goal: Ability  to describe self-care measures that may prevent or decrease complications (Diabetes Survival Skills Education) will improve Outcome: Progressing Goal: Individualized Educational Video(s) Outcome: Progressing   Problem: Coping: Goal: Ability to adjust to condition or change in health will improve Outcome: Progressing   Problem: Fluid Volume: Goal: Ability to maintain a balanced intake and output will improve Outcome: Progressing   Problem: Health Behavior/Discharge Planning: Goal: Ability to identify and utilize available resources and services will improve Outcome: Progressing Goal: Ability to manage health-related needs will improve Outcome: Progressing   Problem: Metabolic: Goal: Ability to maintain appropriate glucose levels will improve Outcome: Progressing   Problem: Nutritional: Goal: Maintenance of adequate nutrition will improve Outcome: Progressing Goal: Progress toward achieving an optimal weight will improve Outcome: Progressing   Problem: Skin Integrity: Goal: Risk for impaired skin integrity will decrease Outcome: Progressing   Problem: Tissue Perfusion: Goal: Adequacy of tissue perfusion will improve Outcome: Progressing   Problem: Activity: Goal: Ability to tolerate increased activity will improve Outcome: Progressing   Problem: Respiratory: Goal: Ability to maintain a clear airway and adequate ventilation will improve Outcome: Progressing   Problem: Role Relationship: Goal: Method of communication will improve Outcome: Progressing   Problem: Education: Goal: Understanding of CV disease, CV risk reduction, and recovery process will improve Outcome: Progressing Goal: Individualized Educational Video(s) Outcome: Progressing   Problem: Activity: Goal: Ability to return to baseline activity level will improve Outcome: Progressing   Problem: Cardiovascular: Goal: Ability to achieve and maintain adequate cardiovascular perfusion will  improve Outcome: Progressing Goal: Vascular access site(s) Level 0-1 will be maintained Outcome: Progressing   Problem: Health Behavior/Discharge Planning: Goal: Ability to safely manage health-related needs after discharge will improve Outcome: Progressing   Problem: Education: Goal: 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 Outcome: Progressing   Problem: Activity: Goal: Ability to tolerate increased activity will improve Outcome: Progressing   Problem: Health Behavior/Discharge Planning: Goal: Ability to manage 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 Outcome: Progressing
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
## 2022-06-01 NOTE — Progress Notes (Signed)
Orders received from MD to restart TF @ 20 and titrate to goal.
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
## 2022-06-01 NOTE — Progress Notes (Signed)
Sputum culture sent down to lab.

## 2022-06-01 NOTE — Progress Notes (Signed)
Patient refused CPT with vest. Suctioned a small amount of white thin secretions and did trach care. Patient doing well at this time.

## 2022-06-01 NOTE — Progress Notes (Signed)
Mobility Specialist Progress Note    06/01/22 1319  Mobility  Activity Transferred from chair to bed  Level of Assistance +2 (takes two people)  Assistive Device MaxiMove  Activity Response Tolerated well  Mobility Referral Yes  $Mobility charge 1 Mobility   Pre-Mobility: 105 HR, 99% SpO2 Post-Mobility: 99 HR  Pt received and agreeable. No complaints. Left with call bell in reach.    Hildred Alamin Mobility Specialist  Secure Chat Only

## 2022-06-01 NOTE — Progress Notes (Signed)
Mobility Specialist Progress Note    06/01/22 1137  Mobility  Activity Transferred from bed to chair  Level of Assistance +2 (takes two people)  Assistive Device MaxiMove  Activity Response Tolerated well  Mobility Referral Yes  $Mobility charge 1 Mobility   Pre-Mobility: 102 HR, 119/86 (95) BP, 98% SpO2 Post-Mobility: 102 HR, 97% SpO2  Pt received and agreeable. No complaints. Left with call bell in reach.   Hildred Alamin Mobility Specialist  Secure Chat Only

## 2022-06-02 DIAGNOSIS — J690 Pneumonitis due to inhalation of food and vomit: Secondary | ICD-10-CM | POA: Diagnosis not present

## 2022-06-02 DIAGNOSIS — A419 Sepsis, unspecified organism: Secondary | ICD-10-CM | POA: Diagnosis not present

## 2022-06-02 DIAGNOSIS — R6521 Severe sepsis with septic shock: Secondary | ICD-10-CM | POA: Diagnosis not present

## 2022-06-02 LAB — CBC
HCT: 25.7 % — ABNORMAL LOW (ref 36.0–46.0)
Hemoglobin: 7.9 g/dL — ABNORMAL LOW (ref 12.0–15.0)
MCH: 27.7 pg (ref 26.0–34.0)
MCHC: 30.7 g/dL (ref 30.0–36.0)
MCV: 90.2 fL (ref 80.0–100.0)
Platelets: 435 10*3/uL — ABNORMAL HIGH (ref 150–400)
RBC: 2.85 MIL/uL — ABNORMAL LOW (ref 3.87–5.11)
RDW: 16.8 % — ABNORMAL HIGH (ref 11.5–15.5)
WBC: 8.7 10*3/uL (ref 4.0–10.5)
nRBC: 0 % (ref 0.0–0.2)

## 2022-06-02 LAB — GLUCOSE, CAPILLARY
Glucose-Capillary: 118 mg/dL — ABNORMAL HIGH (ref 70–99)
Glucose-Capillary: 129 mg/dL — ABNORMAL HIGH (ref 70–99)
Glucose-Capillary: 123 mg/dL — ABNORMAL HIGH (ref 70–99)
Glucose-Capillary: 126 mg/dL — ABNORMAL HIGH (ref 70–99)

## 2022-06-02 LAB — RENAL FUNCTION PANEL
Albumin: 2.1 g/dL — ABNORMAL LOW (ref 3.5–5.0)
Anion gap: 8 (ref 5–15)
BUN: 34 mg/dL — ABNORMAL HIGH (ref 6–20)
CO2: 19 mmol/L — ABNORMAL LOW (ref 22–32)
Calcium: 8.8 mg/dL — ABNORMAL LOW (ref 8.9–10.3)
Chloride: 117 mmol/L — ABNORMAL HIGH (ref 98–111)
Creatinine, Ser: 1.54 mg/dL — ABNORMAL HIGH (ref 0.44–1.00)
GFR, Estimated: 40 mL/min — ABNORMAL LOW (ref >60)
Glucose, Bld: 152 mg/dL — ABNORMAL HIGH (ref 70–99)
Phosphorus: 3.8 mg/dL (ref 2.5–4.6)
Potassium: 3.8 mmol/L (ref 3.5–5.1)
Sodium: 144 mmol/L (ref 135–145)

## 2022-06-02 LAB — MAGNESIUM: Magnesium: 1.8 mg/dL (ref 1.7–2.4)

## 2022-06-02 MED ORDER — DIPHENHYDRAMINE HCL 12.5 MG/5ML PO ELIX
25.0000 mg | ORAL_SOLUTION | Freq: Four times a day (QID) | ORAL | Status: DC | PRN
  Administered 2022-06-03 – 2022-06-13 (×19): 25 mg
  Filled 2022-06-02 (×21): qty 10

## 2022-06-02 MED ORDER — VITAMIN C 500 MG PO TABS
250.0000 mg | ORAL_TABLET | Freq: Every day | ORAL | Status: AC
  Administered 2022-06-02 – 2022-06-08 (×7): 250 mg via ORAL
  Filled 2022-06-02 (×7): qty 1

## 2022-06-02 NOTE — Progress Notes (Signed)
Patient is awake and VS are stable. Breath sounds clear and diminished O2 sats 99%. PMV placed back on patient so she could talk and since she is awake.

## 2022-06-02 NOTE — TOC Progression Note (Signed)
Transition of Care [facility]) - Initial/Assessment Note    Patient Details  Name: Patricia [name] MRN: [id number] Date of Birth: 10-May-1971  [facility] CM/SW Contact:    Milinda Antis, [place] Phone Number: 06/02/2022, 9:13 AM  Clinical Narrative:                 Patient does not have any bed offers.    TOC will continue to follow.  Expected Discharge Plan: [place] Barriers to Discharge: Continued Medical Work up   Patient Goals and CMS Choice        Expected Discharge Plan and Services Expected Discharge Plan: [place]   Discharge Planning Services: CM Consult   Living arrangements for the past 2 months: Single Family Home                                      Prior Living Arrangements/Services Living arrangements for the past 2 months: Single Family Home Lives with:: Siblings          Need for Family Participation in Patient Care: Yes (Comment) Care giver support system in place?: Yes (comment)   Criminal Activity/Legal Involvement Pertinent to Current Situation/Hospitalization: No - Comment as needed  Activities of Daily Living Home Assistive Devices/Equipment: Blood pressure cuff, Eyeglasses, Cane (specify quad or straight) ADL Screening (condition at time of admission) Patient's cognitive ability adequate to safely complete daily activities?: Yes Is the patient deaf or have difficulty hearing?: No Does the patient have difficulty seeing, even when wearing glasses/contacts?: No Does the patient have difficulty concentrating, remembering, or making decisions?: Yes Patient able to express need for assistance with ADLs?: Yes Does the patient have difficulty dressing or bathing?: No Independently performs ADLs?: Yes (appropriate for developmental age) Does the patient have difficulty walking or climbing stairs?: Yes Weakness of Legs: None Weakness of Arms/Hands: None  Permission  Sought/Granted Permission sought to share information with : Case Manager, Family Supports Permission granted to share information with : Yes, Verbal Permission Granted  Share Information with NAME: [facility]     Permission granted to share info w Relationship: sister  Permission granted to share info w Contact Information: [phone]  Emotional Assessment   Attitude/Demeanor/Rapport: Intubated (Following Commands or Not Following Commands)          Admission diagnosis:  Incisional hernia [K43.2] Hernia of abdominal cavity [K46.9] Patient Active Problem List   Diagnosis Date Noted   Pressure injury of skin 37/90/2409   Acute systolic heart failure ([place])    Acute respiratory failure with hypoxia ([place])    Septic shock ([place]) 04/29/2022   Aspiration pneumonia ([place]) 04/29/2022   Prolonged QT interval 04/29/2022   Essential hypertension 04/29/2022   Hernia of abdominal cavity 04/24/2022   Incisional hernia 04/23/2022   Chronic low back pain 11/20/2021   Limited mobility 11/20/2021   Ambulates with cane 11/20/2021   Ventral hernia without obstruction or gangrene 11/15/2021   Right carpal tunnel syndrome 11/15/2021   Diabetes mellitus without complication ([place]) 73/53/2992   Insomnia 11/15/2021   Schizophrenia ([place]) 05/21/2017   Morbid obesity ([place]) 07/22/2014   Obstructive sleep apnea 04/05/2008   MIGRAINE HEADACHE 04/05/2008   Diabetes mellitus type 2 in obese ([place]) 03/21/2008   BIPOLAR DISORDER UNSPECIFIED 03/21/2008   DEPRESSION 03/21/2008   Asthma 03/21/2008   PCP:  [name], MD Pharmacy:  [place], [place] - [address] [place] [address] [id number] [place] [address] Phone: [phone] Fax: [phone]     Social Determinants of Health (SDOH) Interventions    Readmission Risk Interventions     No data to display
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
## 2022-06-03 DIAGNOSIS — Z930 Tracheostomy status: Secondary | ICD-10-CM

## 2022-06-03 DIAGNOSIS — A419 Sepsis, unspecified organism: Secondary | ICD-10-CM | POA: Diagnosis not present

## 2022-06-03 DIAGNOSIS — R1310 Dysphagia, unspecified: Secondary | ICD-10-CM

## 2022-06-03 DIAGNOSIS — R6521 Severe sepsis with septic shock: Secondary | ICD-10-CM | POA: Diagnosis not present

## 2022-06-03 DIAGNOSIS — J690 Pneumonitis due to inhalation of food and vomit: Secondary | ICD-10-CM | POA: Diagnosis not present

## 2022-06-03 LAB — CBC
HCT: 26.2 % — ABNORMAL LOW (ref 36.0–46.0)
Hemoglobin: 7.8 g/dL — ABNORMAL LOW (ref 12.0–15.0)
MCH: 27.3 pg (ref 26.0–34.0)
MCHC: 29.8 g/dL — ABNORMAL LOW (ref 30.0–36.0)
MCV: 91.6 fL (ref 80.0–100.0)
Platelets: 448 10*3/uL — ABNORMAL HIGH (ref 150–400)
RBC: 2.86 MIL/uL — ABNORMAL LOW (ref 3.87–5.11)
RDW: 16.8 % — ABNORMAL HIGH (ref 11.5–15.5)
WBC: 7.7 10*3/uL (ref 4.0–10.5)
nRBC: 0 % (ref 0.0–0.2)

## 2022-06-03 LAB — RENAL FUNCTION PANEL
Albumin: 2.1 g/dL — ABNORMAL LOW (ref 3.5–5.0)
Anion gap: 7 (ref 5–15)
BUN: 28 mg/dL — ABNORMAL HIGH (ref 6–20)
CO2: 20 mmol/L — ABNORMAL LOW (ref 22–32)
Calcium: 8.9 mg/dL (ref 8.9–10.3)
Chloride: 114 mmol/L — ABNORMAL HIGH (ref 98–111)
Creatinine, Ser: 1.34 mg/dL — ABNORMAL HIGH (ref 0.44–1.00)
GFR, Estimated: 48 mL/min — ABNORMAL LOW (ref >60)
Glucose, Bld: 125 mg/dL — ABNORMAL HIGH (ref 70–99)
Phosphorus: 3.9 mg/dL (ref 2.5–4.6)
Potassium: 3.6 mmol/L (ref 3.5–5.1)
Sodium: 141 mmol/L (ref 135–145)

## 2022-06-03 LAB — GLUCOSE, CAPILLARY
Glucose-Capillary: 134 mg/dL — ABNORMAL HIGH (ref 70–99)
Glucose-Capillary: 130 mg/dL — ABNORMAL HIGH (ref 70–99)
Glucose-Capillary: 117 mg/dL — ABNORMAL HIGH (ref 70–99)
Glucose-Capillary: 151 mg/dL — ABNORMAL HIGH (ref 70–99)

## 2022-06-03 LAB — CULTURE, RESPIRATORY W GRAM STAIN

## 2022-06-03 LAB — MAGNESIUM: Magnesium: 1.8 mg/dL (ref 1.7–2.4)

## 2022-06-03 MED ORDER — METHOCARBAMOL 1000 MG/10ML IJ SOLN
500.0000 mg | Freq: Once | INTRAVENOUS | Status: AC
  Administered 2022-06-04: 500 mg via INTRAVENOUS
  Filled 2022-06-03: qty 500

## 2022-06-03 MED ORDER — POLYETHYLENE GLYCOL 3350 17 G PO PACK
34.0000 g | PACK | Freq: Once | ORAL | Status: AC
  Administered 2022-06-03: 34 g
  Filled 2022-06-03: qty 2

## 2022-06-03 MED ORDER — CLONAZEPAM 0.25 MG PO TBDP
0.2500 mg | ORAL_TABLET | Freq: Every day | ORAL | Status: AC
  Administered 2022-06-03 – 2022-06-04 (×2): 0.25 mg
  Filled 2022-06-03 (×2): qty 1

## 2022-06-03 MED ORDER — QUETIAPINE FUMARATE 50 MG PO TABS
25.0000 mg | ORAL_TABLET | Freq: Every day | ORAL | Status: DC
  Administered 2022-06-03 – 2022-06-12 (×10): 25 mg
  Filled 2022-06-03 (×10): qty 1

## 2022-06-03 MED ORDER — NYSTATIN 100000 UNIT/GM EX POWD
Freq: Three times a day (TID) | CUTANEOUS | Status: DC
  Administered 2022-06-03: 1 via TOPICAL
  Filled 2022-06-03 (×3): qty 15

## 2022-06-03 NOTE — Progress Notes (Signed)
Occupational Therapy Treatment Patient Details Name: Patricia [name] MRN: [id number] DOB: 1970-08-03 Today's Date: 06/03/2022   History of present illness 51 yo female admitted 04/23/22 for excisional hernia repair with mesh. Course complicated by post-op ileus, tachycardia, PNA, sepsis. Transfer to ICU 10/2 on levophed. Worsening acidosis 10/3 requiring intubation. Cardiac cath 10/4 with EF 15%. Pt with AKI; on CRRT 10/5-10/14. Failed extubation 10/8 and 10/14; s/p trach 10/14. PMH includes depression, OSA, asthma, schizophrenia, DM, insomnia.   OT comments  Patricia [name] is making incremental progress, session completed with PT to safely progress standing tolerance. Pt requires increased time for encouragement, and preparatory LE stretching due to pain with weightbearing. She tolerated 1 minute of standing in the sara lift frame, and declined a second trial. OT to continue to follow acutely. POC remains appropriate.    Recommendations for follow up therapy are one component of a multi-disciplinary discharge planning process, led by the attending physician.  Recommendations may be updated based on patient status, additional functional criteria and insurance authorization.    Follow Up Recommendations  Acute inpatient rehab (3hours/day)    Assistance Recommended at Discharge Frequent or constant Supervision/Assistance  Patient can return home with the following  Two people to help with walking and/or transfers;Two people to help with bathing/dressing/bathroom;Assist for transportation;Help with stairs or ramp for entrance;Assistance with cooking/housework;Direct supervision/assist for medications management   Equipment Recommendations  Other (comment)    Recommendations for Other Services Rehab consult    Precautions / Restrictions Precautions Precautions: Fall;Other (comment) Precaution Comments: trach collar, PMSV, cortrak Restrictions Weight Bearing Restrictions: No       Mobility  Bed Mobility Overal bed mobility: Needs Assistance             General bed mobility comments: OOB in chair via maximove upon arrival    Transfers Overall transfer level: Needs assistance Equipment used: Ambulation equipment used Transfers: Sit to/from Stand Sit to Stand: Total assist, +2 physical assistance, +2 safety/equipment           General transfer comment: limited by BLE pain with weightbearing Transfer via Lift Equipment: Joen Laura Overall balance assessment: Needs assistance Sitting-balance support: Feet supported, Feet unsupported Sitting balance-Leahy Scale: Fair     Standing balance support: Bilateral upper extremity supported, Reliant on assistive device for balance Standing balance-Leahy Scale: Zero Standing balance comment: sara lift used this date                           ADL either performed or assessed with clinical judgement   ADL Overall ADL's : Needs assistance/impaired Eating/Feeding: NPO                       Toilet Transfer: Total assistance;+2 for physical assistance;+2 for safety/equipment Toilet Transfer Details (indicate cue type and reason): with sara plus         Functional mobility during ADLs: Total assistance;+2 for physical assistance;+2 for safety/equipment General ADL Comments: full upright obtained with srar plus, increased time for cues an dencouragement needed    Extremity/Trunk Assessment Upper Extremity Assessment Upper Extremity Assessment: RUE deficits/detail;LUE deficits/detail RUE Deficits / Details: generally weak, able to lift and grasp for use of sara plus LUE Deficits / Details: generally weak, able to lift and grasp for use of sara plus   Lower Extremity Assessment Lower Extremity Assessment: Defer to PT evaluation        Vision   Vision Assessment?:  No apparent visual deficits   Perception Perception Perception: Not tested   Praxis Praxis Praxis: Not tested     Cognition Arousal/Alertness: Awake/alert Behavior During Therapy: Flat affect Overall Cognitive Status: Impaired/Different from baseline Area of Impairment: Attention, Following commands, Safety/judgement, Problem solving                   Current Attention Level: Sustained   Following Commands: Follows one step commands with increased time Safety/Judgement: Decreased awareness of safety, Decreased awareness of deficits   Problem Solving: Requires tactile cues, Requires verbal cues, Slow processing, Decreased initiation, Difficulty sequencing General Comments: benefits from increased time to process, doing well with verbalizing wants/needs. anxious.        Exercises Exercises: Other exercises, General Lower Extremity (PT completed BLE exercises and prolonged stretching as a precursor to standing)    Shoulder Instructions       General Comments VSS on TC and PSMV    Pertinent Vitals/ Pain       Pain Assessment Pain Assessment: Faces Faces Pain Scale: Hurts even more Pain Location: bilat LEs with stretching and standing Pain Descriptors / Indicators: Grimacing, Spasm Pain Intervention(s): Limited activity within patient's tolerance, Monitored during session  Home Living                                          Prior Functioning/Environment              Frequency  Min 2X/week        Progress Toward Goals  OT Goals(current goals can now be found in the care plan section)  Progress towards OT goals: Progressing toward goals  Acute Rehab OT Goals Patient Stated Goal: less pain OT Goal Formulation: With patient Time For Goal Achievement: 06/10/22 Potential to Achieve Goals: Good ADL Goals Pt Will Perform Grooming: with mod assist;sitting Pt Will Perform Upper Body Dressing: with mod assist;sitting Pt Will Perform Lower Body Dressing: with max assist;sit to/from stand Pt Will Transfer to Toilet: with max assist;stand pivot  transfer;bedside commode Additional ADL Goal #1: pt will complete bed mobility with mod A as a precursor to ADLs  Plan Discharge plan remains appropriate    Co-evaluation    PT/OT/SLP Co-Evaluation/Treatment: Yes Reason for Co-Treatment: Complexity of the patient's impairments (multi-system involvement);Necessary to address cognition/behavior during functional activity;For patient/therapist safety;To address functional/ADL transfers PT goals addressed during session: Mobility/safety with mobility OT goals addressed during session: ADL's and self-care      AM-PAC OT "6 Clicks" Daily Activity     Outcome Measure   Help from another person eating meals?: Total Help from another person taking care of personal grooming?: A Lot Help from another person toileting, which includes using toliet, bedpan, or urinal?: Total Help from another person bathing (including washing, rinsing, drying)?: A Lot Help from another person to put on and taking off regular upper body clothing?: A Lot Help from another person to put on and taking off regular lower body clothing?: Total 6 Click Score: 9    End of Session Equipment Utilized During Treatment: Oxygen;Other (comment) (sara lift)  OT Visit Diagnosis: Unsteadiness on feet (R26.81);Muscle weakness (generalized) (M62.81);Other symptoms and signs involving cognitive function   Activity Tolerance Patient tolerated treatment well   Patient Left in chair;with call bell/phone within reach;with chair alarm set   Nurse Communication Mobility status;Need for lift equipment  Time: 2248-2500 OT Time Calculation (min): 37 min  Charges: OT General Charges $OT Visit: 1 Visit OT Treatments $Therapeutic Activity: 8-22 mins    Elliot Cousin 06/03/2022, 12:22 PM

## 2022-06-03 NOTE — Progress Notes (Signed)
33 Days Post-Op   Subjective/Chief Complaint:  Resting comfortably this morning.  Needed a few doses of dilaudid overnight.    Objective: Vital signs in last 24 hours: Temp:  [97.4 F (36.3 C)-98.6 F (37 C)] 98.6 F (37 C) (11/05 2319) Pulse Rate:  [89-102] 99 (11/06 0427) Resp:  [17-32] 20 (11/06 0427) BP: (117-131)/(80-99) 119/80 (11/06 0427) SpO2:  [96 %-100 %] 98 % (11/06 0427) FiO2 (%):  [28 %] 28 % (11/06 0427) Weight:  [80.4 kg] 80.4 kg (11/06 0500) Last BM Date : 06/02/22  Intake/Output from previous day: 11/05 0701 - 11/06 0700 In: 2515.9 [I.V.:30; UG/QB:1694.5; IV Piggyback:510] Out: 1400 [Urine:1400] Intake/Output this shift: No intake/output data recorded.  Trach in place Abd - soft, Nontender  Lab Results:  Recent Labs    06/02/22 1808 06/03/22 0410  WBC 8.7 7.7  HGB 7.9* 7.8*  HCT 25.7* 26.2*  PLT 435* 448*    BMET Recent Labs    06/02/22 1040 06/03/22 0410  NA 144 141  K 3.8 3.6  CL 117* 114*  CO2 19* 20*  GLUCOSE 152* 125*  BUN 34* 28*  CREATININE 1.54* 1.34*  CALCIUM 8.8* 8.9    PT/INR No results for input(s): "LABPROT", "INR" in the last 72 hours. ABG No results for input(s): "PHART", "HCO3" in the last 72 hours.  Invalid input(s): "PCO2", "PO2"  Studies/Results: No results found.  Anti-infectives: Anti-infectives (From admission, onward)    Start     Dose/Rate Route Frequency Ordered Stop   06/01/22 1515  ceFEPIme (MAXIPIME) 2 g in sodium chloride 0.9 % 100 mL IVPB        2 g 200 mL/hr over 30 Minutes Intravenous Every 12 hours 06/01/22 1420     05/15/22 1445  ceFAZolin (ANCEF) IVPB 2g/100 mL premix       Note to Pharmacy: To be given in IR   2 g 200 mL/hr over 30 Minutes Intravenous  Once 05/15/22 1352 05/15/22 1616   05/15/22 1230  ceFAZolin (ANCEF) powder 1 g  Status:  Discontinued        1 g Other To Surgery 05/15/22 1140 05/15/22 1239   05/15/22 1230  ceFAZolin (ANCEF) IVPB 2g/100 mL premix        2 g 200  mL/hr over 30 Minutes Intravenous To Radiology 05/15/22 1140 05/15/22 1140   05/15/22 0000  ceFAZolin (ANCEF) IVPB 2g/100 mL premix        2 g 200 mL/hr over 30 Minutes Intravenous To Radiology 05/14/22 1308 05/15/22 0005   05/13/22 2134  meropenem (MERREM) 500 mg in sodium chloride 0.9 % 100 mL IVPB        500 mg 200 mL/hr over 30 Minutes Intravenous Every 24 hours 05/13/22 0815 05/17/22 0700   05/13/22 1000  meropenem (MERREM) 500 mg in sodium chloride 0.9 % 100 mL IVPB  Status:  Discontinued        500 mg 200 mL/hr over 30 Minutes Intravenous Every 12 hours 05/12/22 0940 05/12/22 0946   05/12/22 2200  meropenem (MERREM) 500 mg in sodium chloride 0.9 % 100 mL IVPB  Status:  Discontinued        500 mg 200 mL/hr over 30 Minutes Intravenous Every 12 hours 05/12/22 0946 05/13/22 0815   05/12/22 2000  meropenem (MERREM) 1 g in sodium chloride 0.9 % 100 mL IVPB  Status:  Discontinued        1 g 200 mL/hr over 30 Minutes Intravenous  Once 05/12/22 0940 05/12/22 0946  05/06/22 1530  meropenem (MERREM) 1 g in sodium chloride 0.9 % 100 mL IVPB  Status:  Discontinued        1 g 200 mL/hr over 30 Minutes Intravenous Every 8 hours 05/06/22 1438 05/12/22 0940   05/04/22 1000  vancomycin (VANCOCIN) IVPB 1000 mg/200 mL premix  Status:  Discontinued        1,000 mg 200 mL/hr over 60 Minutes Intravenous Every 24 hours 05/03/22 0802 05/08/22 0913   05/03/22 0845  vancomycin (VANCOREADY) IVPB 2000 mg/400 mL        2,000 mg 200 mL/hr over 120 Minutes Intravenous  Once 05/03/22 0751 05/03/22 1114   05/01/22 1800  piperacillin-tazobactam (ZOSYN) IVPB 3.375 g  Status:  Discontinued        3.375 g 12.5 mL/hr over 240 Minutes Intravenous Every 6 hours 05/01/22 1426 05/01/22 1427   05/01/22 1515  piperacillin-tazobactam (ZOSYN) IVPB 3.375 g  Status:  Discontinued        3.375 g 100 mL/hr over 30 Minutes Intravenous Every 6 hours 05/01/22 1428 05/06/22 1438   04/30/22 1400  piperacillin-tazobactam (ZOSYN) IVPB  2.25 g  Status:  Discontinued        2.25 g 100 mL/hr over 30 Minutes Intravenous Every 8 hours 04/30/22 1325 05/01/22 1426   04/29/22 1530  piperacillin-tazobactam (ZOSYN) IVPB 3.375 g  Status:  Discontinued        3.375 g 12.5 mL/hr over 240 Minutes Intravenous Every 8 hours 04/29/22 1433 04/30/22 1325   04/29/22 1145  metroNIDAZOLE (FLAGYL) IVPB 500 mg  Status:  Discontinued        500 mg 100 mL/hr over 60 Minutes Intravenous Every 12 hours 04/29/22 1047 04/29/22 1433   04/29/22 1145  vancomycin (VANCOCIN) IVPB 1000 mg/200 mL premix  Status:  Discontinued        1,000 mg 200 mL/hr over 60 Minutes Intravenous  Once 04/29/22 1047 04/29/22 1055   04/29/22 1145  vancomycin (VANCOREADY) IVPB 1500 mg/300 mL  Status:  Discontinued        1,500 mg 150 mL/hr over 120 Minutes Intravenous Every 24 hours 04/29/22 1055 04/29/22 1357   04/29/22 1045  ceFEPIme (MAXIPIME) 2 g in sodium chloride 0.9 % 100 mL IVPB  Status:  Discontinued        2 g 200 mL/hr over 30 Minutes Intravenous Every 8 hours 04/29/22 0954 04/29/22 1433   04/23/22 0730  ceFAZolin (ANCEF) IVPB 2g/100 mL premix        2 g 200 mL/hr over 30 Minutes Intravenous On call to O.R. 04/23/22 0726 04/23/22 0828       Assessment/Plan: Ms. Seivert is s/p robotic incisional hernia repair on 04/23/22 - Postop ileus complicated by aspiration: Ileus resolved, tube feeds at goal - Inflammatory enteritis improved, CT abd/pel 05/29/22 normal - Labs normalizing - Okay for diet as deemed fit by speech therapy team   Acute metabolic encephalopathy superimposed on hx bipolar disorder, schizophrenia   ICU delirium- improved now off all IV infusions. Acute biventricular HFrEF (Etiology sepsis, stress induced CM. Has clean coronaries) Cardiogenic shock resolved, EF improved.  Acute hypoxic respiratory failure Aspiration PNA Pulm edema   RLL VAP  AKI with oliguria; no baseline CKD - appears to have some renal recovery, but Cr up some yesterday but  good UOP Anemia, acute on chronic  DM2 with hyperglycemia  Severe protein calorie malnutrition  Pressure injury lip - improving  Deconditioning    Appreciate [facility] care with multiple issues  Needs therapy and recouperation     Dispo - Medically stable for discharge.  Difficult placement.   LOS: 40 days    Patricia [name] 06/03/2022

## 2022-06-03 NOTE — Progress Notes (Signed)
NAME:  Patricia [name], MRN:  [id number], DOB:  Jun 23, 1971, LOS: 48 ADMISSION DATE:  04/23/2022, CONSULTATION DATE:  04/29/2022 REFERRING MD:  Felicie Morn, MD, CHIEF COMPLAINT:   Small bowel obstruction, aspiration event, sepsis, respiratory failure    History of Present Illness:  Patricia [name] with above hx and nuc study in 2015 neg for ischemia done for chest pain and echo with EF 60-65% presented to hospital for excisional hernia repair (robotic hernia repair 04/23/22) with mesh, ileus post op on TNA. Developed vomiting and hypotension after NG came out on 04/29/22 and PNA on CXR, was tachycardic and BP improved with IV fluids. DX with sepsis and ABX started. At one point prolonged Qtc. Pt admitted to ICU on 10/2.  Was placed on levophed. Early AM on 10/3 was intubated for worsening metabolic/lactic acidosis. She had AKI as well. Crt was 6.01, her normal 0.9. Today Cr is 4 on CRRT.  Pertinent  Medical History  DM2 Depression OSA Migraines  Asthma Obesity Schizophrenia HTN HLD  Significant Hospital Events: Including procedures, antibiotic start and stop dates in addition to other pertinent events   9/26 Robotic hernia repair complicated by abd adhesions.  10/2 CT + for developing SBO and bilat lower lobe opacities most likely aspiration PNA. Progressive decompensation requiring transfer to ICU 10/2 PCCM consulted for respiratory failure & shock 10\3 Intubated for resp failure. CVL and arterial line placed. 04/30/22: EF 30-35%, RV moderately reduced, mild to moderate MR, IVC collapsed throughout respiratory cycle 10\4 EKG changes/trop increased--> and taken to cath lab - No obstructive CAD, but in decompensated heart failure, Bedside echo with EF 15%, new WMA, moderate RV dysfunction, trivial pericardial effusion.underwent emergent L/RHC. Normal coronaries, RA mean 13, PA mean 29, LVEDP 16, PCWP 19, CO/CI 3.96/1.87   returned to CICU post-cath. Right heart cath placed right fem.  Right IJ HD cath placed.  10/5 advanced HF team following, shock supported on norepi/vasopressin and Dobutamine. On CRRT.  10/6 weaning Norepi. Starting to wean fent. Needing restraints.  10/8: Reintubated due to neuromuscular weakness.  Bedside echo demonstrating improved RV function, LV function remained depressed 10/9 CT abdomen pelvis.suggesting wall thickening throughout the proximal and mid small bowel consistent with possible infectious or inflammatory enteritis there is also bibasilar airspace disease which could reflect pneumonia.  Antibiotic coverage widened.  Zosyn changed to meropenem.  Tolerating trickle feeds still on TPN 10/10 stopped stress dose steroids.  Resumed subcutaneous heparin. 10/13 albumin for coox decrease. Weaning vent . Failed SBT. At goal TF. 10/14 extubated to bipap failed due to weakness, trached 10/15 trial off CRRT, lasix 10/18 RIJ permcath 10/22 transfer 4N as a lateral after aspiration event briefly requiring mechanical ventilation 10/25 transfer to [place] progressive  11/1 TDC removed  Interim History / Subjective:  Has been spending more time up in the chair. No longer requiring HD.  Per RN, still having large amount of thick secretions from trach, but clear.  Objective   Blood pressure (!) 139/94, pulse (!) 110, temperature 98.2 F (36.8 C), temperature source Oral, resp. rate (!) 23, height '5\' 4"'$  (1.626 m), weight 80.4 kg, last menstrual period 07/07/2014, SpO2 96 %.    FiO2 (%):  [28 %] 28 %   Intake/Output Summary (Last 24 hours) at 06/03/2022 1405 Last data filed at 06/03/2022 1055 Gross per 24 hour  Intake 2515.92 ml  Output 1100 ml  Net 1415.92 ml    Filed Weights   05/31/22 0515 06/01/22 0500 06/03/22 0500  Weight:  81.6 kg 77.8 kg 80.4 kg    Examination:  General chronically ill appearing woman sitting up in bed in NAD HEENT [place]/AT, eyes anicteric. Face ulcers healing well. Neck: trach in place, no bleeding or drainage Pulmonary: rhonchi  bilaterally, mild tachypnea. No accessory muscle use. 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 Has  AKI due to septic ATN, no renal recovery S/p hernia repair &  LOA inflammatory enteritis  Anemia, acute on chronic  Acute urinary retention DM2 with hyperglycemia  Severe protein calorie malnutrition  Morbid obesity Pressure injury- lip in the setting of ET tube: Not present on admission  Pulm problem list: Acute respiratory failure with hypoxia Tracheostomy dependence  dysphagia   Discussion: She has progressed since I saw her last in the intensive care, still too debilitated to consider progression from a tracheostomy standpoint.  Had an episode of vomiting earlier today, ileus was what brought her in trouble in the first place  Plan: -Trach care per protocol -Recommend CPT, which she refuses. I worry about mucus plugging. Con't to encourage coughing.  -Con't OOB moblity -Con't working with SLP; doing well with PMV currently. -Diet recs per SLP. Con't cortrak for nutrition.  We will con't to follow weekly for her trach. Please call PCCM in the interim with questions.    Best Practice (right click and "Reselect all SmartList Selections" daily)   Per primary  Julian Hy, DO 06/03/22 4:30 PM [facility]

## 2022-06-03 NOTE — Progress Notes (Signed)
Physical Therapy Treatment Patient Details Name: Patricia [name] MRN: [id number] DOB: 27-Dec-1970 Today's Date: 06/03/2022   History of Present Illness 51 yo female admitted 04/23/22 for excisional hernia repair with mesh. Course complicated by post-op ileus, tachycardia, PNA, sepsis. Transfer to ICU 10/2 on levophed. Worsening acidosis 10/3 requiring intubation. Cardiac cath 10/4 with EF 15%. Pt with AKI; on CRRT 10/5-10/14. Failed extubation 10/8 and 10/14; s/p trach 10/14. PMH includes depression, OSA, asthma, schizophrenia, DM, insomnia.    PT Comments    Pt progressing slowly towards her physical therapy goals. Received sitting up in chair. Pt reports BLE pain with standing and manual gastroc stretch. Will benefit from continued PRAFO use when in bed for passive stretching and heel protection. Utilized Sara Lift to stand from chair, where pt was able to maintain for 1 minute. Performed seated exercises/ROM in seated position as well. Will continue to progress as tolerated.     Recommendations for follow up therapy are one component of a multi-disciplinary discharge planning process, led by the attending physician.  Recommendations may be updated based on patient status, additional functional criteria and insurance authorization.  Follow Up Recommendations  Acute inpatient rehab (3hours/day) Can patient physically be transported by private vehicle: No   Assistance Recommended at Discharge Frequent or constant Supervision/Assistance  Patient can return home with the following Two people to help with walking and/or transfers;Two people to help with bathing/dressing/bathroom;Assistance with cooking/housework;Assistance with feeding;Direct supervision/assist for medications management;Direct supervision/assist for financial management;Assist for transportation;Help with stairs or ramp for entrance   Equipment Recommendations  Hospital bed;Wheelchair (measurements PT);Wheelchair cushion  (measurements PT);Other (comment) (hoyer lift)    Recommendations for Other Services       Precautions / Restrictions Precautions Precautions: Fall;Other (comment) Precaution Comments: trach collar, PMSV, cortrak Restrictions Weight Bearing Restrictions: No     Mobility  Bed Mobility               General bed mobility comments: OOB in chair upon arrival    Transfers Overall transfer level: Needs assistance Equipment used: Ambulation equipment used Transfers: Sit to/from Stand Sit to Stand: Max assist, +2 physical assistance           General transfer comment: Tamala Ser Lift to stand from chair with use of pad to initiate/maintain glute activation. Pt tolerated standing in [place] Lift x 1 minute, verbal cues for scapular retraction and upright posture Transfer via Lift Equipment: Agricultural engineer Rankin (Stroke Patients Only)       Balance Overall balance assessment: Needs assistance Sitting-balance support: Feet supported Sitting balance-Leahy Scale: Poor Sitting balance - Comments: Right lateral lean in recliner                                    Cognition Arousal/Alertness: Awake/alert Behavior During Therapy: Flat affect Overall Cognitive Status: Impaired/Different from baseline Area of Impairment: Attention, Following commands, Safety/judgement, Problem solving                   Current Attention Level: Sustained   Following Commands: Follows one step commands with increased time Safety/Judgement: Decreased awareness of safety, Decreased awareness of deficits   Problem Solving: Requires tactile cues, Requires verbal cues,  Slow processing, Decreased initiation, Difficulty sequencing General Comments: Pt verbalizing today with PMSV donned, following 1 step commands        Exercises General Exercises - Lower Extremity Ankle  Circles/Pumps: Both, 10 reps, Seated Long Arc Quad: Both, 10 reps, Seated Other Exercises Other Exercises: Seated: manual bilateral gastroc stretch x 1 min each    General Comments        Pertinent Vitals/Pain Pain Assessment Pain Assessment: Faces Faces Pain Scale: Hurts whole lot Pain Location: BLE's Pain Descriptors / Indicators: Discomfort, Grimacing, Guarding Pain Intervention(s): Limited activity within patient's tolerance, Monitored during session    Home Living                          Prior Function            PT Goals (current goals can now be found in the care plan section) Acute Rehab PT Goals Patient Stated Goal: to improve Potential to Achieve Goals: Fair Progress towards PT goals: Progressing toward goals    Frequency    Min 3X/week      PT Plan Current plan remains appropriate    Co-evaluation PT/OT/SLP Co-Evaluation/Treatment: Yes Reason for Co-Treatment: Complexity of the patient's impairments (multi-system involvement);Necessary to address cognition/behavior during functional activity;For patient/therapist safety;To address functional/ADL transfers PT goals addressed during session: Mobility/safety with mobility OT goals addressed during session: ADL's and self-care      AM-PAC PT "6 Clicks" Mobility   Outcome Measure  Help needed turning from your back to your side while in a flat bed without using bedrails?: A Lot Help needed moving from lying on your back to sitting on the side of a flat bed without using bedrails?: Total Help needed moving to and from a bed to a chair (including a wheelchair)?: Total Help needed standing up from a chair using your arms (e.g., wheelchair or bedside chair)?: Total Help needed to walk in hospital room?: Total Help needed climbing 3-5 steps with a railing? : Total 6 Click Score: 7    End of Session   Activity Tolerance: Patient limited by fatigue Patient left: in chair;with call bell/phone  within reach;with chair alarm set Nurse Communication: Mobility status PT Visit Diagnosis: Muscle weakness (generalized) (M62.81);Difficulty in walking, not elsewhere classified (R26.2);Other abnormalities of gait and mobility (R26.89);Unsteadiness on feet (R26.81)     Time: 8546-2703 PT Time Calculation (min) (ACUTE ONLY): 33 min  Charges:  $Therapeutic Activity: 8-22 mins                     Wyona Almas, PT, DPT Acute Rehabilitation Services Office [phone]    Deno Etienne 06/03/2022, 12:15 PM

## 2022-06-03 NOTE — Progress Notes (Signed)
PROGRESS NOTE    Patricia [name]  ZOX:[id number] DOB: [birth year] DOA: 04/23/2022 PCP: [name], MD   Brief Narrative:   51 year old with history of DM2, depression, migraine, asthma, schizophrenia, HTN, HLD presented to the hospital for exertional hernia, robotic hernia repair on 04/23/2022 with mesh who developed postop ileus and is on TNA.  Also patient developed vomiting and hypotension after NG tube came out on 04/29/2022 with concerns of pneumonia which was treated with antibiotics and IV fluids.  During that time patient also required Levophed and eventually intubated on 10/3 for worsening metabolic acidosis complicated by acute kidney injury.  Echocardiogram showed depressed EF of 30-35% with elevated troponin.  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  Patient was seen by CHF team started on dopamine and CRRT as well.  Patient was again reintubated due to neuromuscular weakness on 10/8.  Eventually extubated on 10/14 to BiPAP and trach was placed due to weakness.  Tube feeds held due to nausea vomiting on 10/30.  Hospital course complicated by hypernatremia, started D5 water   Assessment & Plan:  Principal Problem:   Septic shock ([place]) Active Problems:   Hernia of abdominal cavity   Aspiration pneumonia (HCC)   Prolonged QT interval   Diabetes mellitus type 2 in obese (HCC)   BIPOLAR DISORDER UNSPECIFIED   Morbid obesity (HCC)   Chronic low back pain   Essential hypertension   Acute systolic heart failure (HCC)   Acute respiratory failure with hypoxia (HCC)   Pressure injury of skin     Assessment and Plan:  * Septic shock ([place]) secondary to aspiration pneumonia, right lower lobe Acute respiratory failure with prolonged mechanical ventilation status post tracheostomy -Trach management by pulmonary team. - Bronchodilators as needed, supportive care.  Out of bed to chair -toelrating PMV -Wrist trach secretions, work of breathing, she started  on cefepime for pneumonia, to treat for 5 days total, continue with chest PT, follow on sputum cultures.   -Patient failed MBS on Friday, remains n.p.o., only ice chips, feeding via cortrak only  Status post hernia repair complicated by postop ileus/inflammatory enteritis Intermittent nausea vomiting -Management by general surgery.  She is working with speech and swallow.  Maintain n.p.o. status, PMV.  Tube feeds -Management per primary surgical team, repeat CT abdomen with no acute abdominal findings.  Hypernatremia - Solved, if sodium started to increase then will start on D5W , as surgery would like to avoid excessive free water via cortrak, as it usually causes nausea, vomiting and abdominal pain .  AKI -Likely due to ATN in the setting of cardiogenic/septic shock, required CRRT 10/410/14, first HD on 10/16, renal function has recovered.  Right knee pain - Unclear etiology, no evidence of trauma.  Negative x-ray  Acute kidney injury, improving -Initially required CRRT and HD.  Now improving without dialysis  Biventricular failure, reduced EF - Last seen by CHF team 10/16.  Previously tried Bumex with minimal to no urine output.  At this point patient is on dialysis and has been well compensated.  Plans to follow-up outpatient. LHC shows clean coronaries.   Sinus tachycardia; improved.  - Now on metoprolol 50 mg twice daily.  IV as needed  Anemia of chronic disease - From underlying multiple medical issues.  Transfuse as necessary and attempt to keep hemoglobin greater than 8  Essential hypertension -Currently on metoprolol twice daily -We will add as needed hydralazine  Chronic low back pain -As needed pain control  Morbid  obesity ([place]) -Body mass index is 41.37 kg/m..  Currently on tube feeds -Follow-up outpatient, weight loss diet and exercise  BIPOLAR DISORDER UNSPECIFIED -On Klonopin.  Should slowly resume home meds as appropriate  Diabetes mellitus type 2 in obese  (HCC) -Sliding scale and Accu-Cheks.  Pressure area on the lip in the setting of ET tube placement which was not present prior to admission.  Routine wound care.  PT-CIR      DVT prophylaxis: Subcu heparin Code Status: Full code Family Communication:  none at bedside  Final dispo per general surgery  Nutritional status    Signs/Symptoms: NPO status (with need for MBS per SLP)  Interventions: Refer to RD note for recommendations, Tube feeding, MVI  Body mass index is 30.43 kg/m.       Subjective:  No significant events overnight, she denies any complaints today, no BM over last 2 days  Examination:    Awake Alert, Oriented X 3, No new F.N deficits, Normal affect ,trach with PMV Symmetrical Chest wall movement, Good air movement bilaterally, CTAB RRR,No Gallops,Rubs or new Murmurs, No Parasternal Heave +ve B.Sounds, Abd Soft, No tenderness, No rebound - guarding or rigidity. Bilateral Unna boots   Tracheostomy in place Tube feeding(CORTRAK) in place  Objective: Vitals:   06/03/22 0500 06/03/22 0805 06/03/22 0830 06/03/22 1055  BP:  (!) 109/91  (!) 131/96  Pulse:  (!) 102 100 (!) 107  Resp:    (!) 23  Temp:  98 F (36.7 C)  98.2 F (36.8 C)  TempSrc:  Oral  Oral  SpO2:  100% 96% 96%  Weight: 80.4 kg     Height:        Intake/Output Summary (Last 24 hours) at 06/03/2022 1126 Last data filed at 06/03/2022 1055 Gross per 24 hour  Intake 2515.92 ml  Output 1700 ml  Net 815.92 ml    Filed Weights   05/31/22 0515 06/01/22 0500 06/03/22 0500  Weight: 81.6 kg 77.8 kg 80.4 kg     Data Reviewed:   CBC: Recent Labs  Lab 05/29/22 0644 05/30/22 0028 05/31/22 0323 06/02/22 1808 06/03/22 0410  WBC 15.9* 10.9* 8.3 8.7 7.7  HGB 9.2* 8.4* 7.9* 7.9* 7.8*  HCT 30.7* 28.0* 25.9* 25.7* 26.2*  MCV 92.2 91.8 91.5 90.2 91.6  PLT 605* 591* 518* 435* 782*   Basic Metabolic Panel: Recent Labs  Lab 05/30/22 0028 05/31/22 0323 06/01/22 0220  06/02/22 1040 06/03/22 0410  NA 145 143 147* 144 141  K 3.4* 3.4* 4.2 3.8 3.6  CL 113* 112* 117* 117* 114*  CO2 24 23 20* 19* 20*  GLUCOSE 110* 122* 107* 152* 125*  BUN 40* 43* 43* 34* 28*  CREATININE 1.74* 1.61* 2.03* 1.54* 1.34*  CALCIUM 8.8* 8.6* 8.7* 8.8* 8.9  MG 2.0 1.7 1.8 1.8 1.8  PHOS 4.7* 4.4 4.4 3.8 3.9   GFR: Estimated Creatinine Clearance: 50.4 mL/min (A) (by C-G formula based on SCr of 1.34 mg/dL (H)). Liver Function Tests: Recent Labs  Lab 05/29/22 0644 05/30/22 0028 05/31/22 0323 06/01/22 0220 06/02/22 1040 06/03/22 0410  AST 113*  --   --   --   --   --   ALT 197*  --   --   --   --   --   ALKPHOS 115  --   --   --   --   --   BILITOT 0.4  --   --   --   --   --  PROT 8.8*  --   --   --   --   --   ALBUMIN 2.4*  2.4* 2.1* 2.0* 2.3* 2.1* 2.1*   No results for input(s): "LIPASE", "AMYLASE" in the last 168 hours. No results for input(s): "AMMONIA" in the last 168 hours. Coagulation Profile: No results for input(s): "INR", "PROTIME" in the last 168 hours. Cardiac Enzymes: No results for input(s): "CKTOTAL", "CKMB", "CKMBINDEX", "TROPONINI" in the last 168 hours. BNP (last 3 results) No results for input(s): "PROBNP" in the last 8760 hours. HbA1C: No results for input(s): "HGBA1C" in the last 72 hours. CBG: Recent Labs  Lab 06/02/22 0548 06/02/22 1131 06/02/22 1701 06/02/22 2315 06/03/22 0501  GLUCAP 118* 129* 123* 126* 134*   Lipid Profile: No results for input(s): "CHOL", "HDL", "LDLCALC", "TRIG", "CHOLHDL", "LDLDIRECT" in the last 72 hours. Thyroid Function Tests: No results for input(s): "TSH", "T4TOTAL", "FREET4", "T3FREE", "THYROIDAB" in the last 72 hours. Anemia Panel: No results for input(s): "VITAMINB12", "FOLATE", "FERRITIN", "TIBC", "IRON", "RETICCTPCT" in the last 72 hours. Sepsis Labs: No results for input(s): "PROCALCITON", "LATICACIDVEN" in the last 168 hours.  Recent Results (from the past 240 hour(s))  Culture, Respiratory w  Gram Stain     Status: None   Collection Time: 06/01/22  8:39 AM   Specimen: Tracheal Aspirate; Respiratory  Result Value Ref Range Status   Specimen Description TRACHEAL ASPIRATE  Final   Special Requests NONE  Final   Gram Stain   Final    ABUNDANT WBC PRESENT, PREDOMINANTLY PMN MODERATE GRAM POSITIVE COCCI IN CHAINS IN PAIRS    Culture   Final    RARE KLEBSIELLA PNEUMONIAE WITHIN NORMAL RESPIRATORY FLORA Performed at [facility], [place] [address] [place], [place] [address]    Report Status 06/03/2022 FINAL  Final   Organism ID, Bacteria KLEBSIELLA PNEUMONIAE  Final      Susceptibility   Klebsiella pneumoniae - MIC*    AMPICILLIN >=32 RESISTANT Resistant     CEFAZOLIN <=4 SENSITIVE Sensitive     CEFEPIME <=0.12 SENSITIVE Sensitive     CEFTAZIDIME <=1 SENSITIVE Sensitive     CEFTRIAXONE <=0.25 SENSITIVE Sensitive     CIPROFLOXACIN <=0.25 SENSITIVE Sensitive     GENTAMICIN <=1 SENSITIVE Sensitive     IMIPENEM <=0.25 SENSITIVE Sensitive     TRIMETH/SULFA >=320 RESISTANT Resistant     AMPICILLIN/SULBACTAM 8 SENSITIVE Sensitive     PIP/TAZO <=4 SENSITIVE Sensitive     * RARE KLEBSIELLA PNEUMONIAE         Radiology Studies: No results found.      Scheduled Meds:  ascorbic acid  250 mg Oral Daily   clonazePAM  0.5 mg Per Tube QHS   copper  2 mg Per Tube Daily   diclofenac Sodium  2 g Topical QID   feeding supplement (PROSource TF20)  60 mL Per Tube Daily   fiber  1 packet Per Tube BID   heparin injection (subcutaneous)  5,000 Units Subcutaneous Q8H   insulin aspart  0-20 Units Subcutaneous Q6H   insulin aspart  4 Units Subcutaneous Q6H   metoprolol tartrate  50 mg Per Tube BID   multivitamin with minerals  1 tablet Per Tube Daily   nystatin   Topical TID   mouth rinse  15 mL Mouth Rinse 4 times per day   pantoprazole (PROTONIX) IV  40 mg Intravenous Daily   polyethylene glycol  17 g Per Tube Daily   QUEtiapine  50 mg Per Tube QHS  senna  1 tablet Per  Tube Daily   sodium chloride flush  10-40 mL Intracatheter Q12H   zinc sulfate  220 mg Oral Daily   Continuous Infusions:  sodium chloride 10 mL/hr at 05/20/22 1900   albumin human     ceFEPime (MAXIPIME) IV Stopped (06/02/22 2211)   feeding supplement (PEPTAMEN 1.5 CAL) 55 mL/hr at 06/03/22 0300     LOS: 37 days      Phillips Climes, MD [facility]  If 7PM-7AM, please contact night-coverage  06/03/2022, 11:26 AM

## 2022-06-03 NOTE — Progress Notes (Signed)
Speech Language Pathology Treatment: Dysphagia;Passy Muir Speaking valve  Patient Details Name: Patricia [name] MRN: [id number] DOB: [birth year] Today's Date: 06/03/2022 Time: 1350-1420 SLP Time Calculation (min) (ACUTE ONLY): 30 min  Assessment / Plan / Recommendation Clinical Impression  Patricia [name] was sitting in the recliner after OT/PT session today.  Patricia [name] was wearing PMV upon entering room - vocalizations are much louder with improved quality.  Patricia [name] continues to require intermittent verbal cueing to maintain volume and intelligibility. Patricia [name] asked repeatedly for water. We talked about last Friday's MBS and the aspiration that occurred.  Patricia [name] worked on effortful swallows and a brief oral hold before initiating the swallow.  Patricia [name] consumed multiple ice chips then followed with three bites of pudding.  There was intermittent coughing throughout session - this occurred prior to PO trials as well as during intake.  Patricia [name] is tolerating the PMV well - changed orders to allow use during all waking hours with intermittent supervision. Patricia [name] may have ice chips after oral care.  Will plan to repeat MBS end of this week or early next week to determine next steps for nutrition.    HPI HPI: Patricia [name] is a 51 y.o. female who presented to hospital for excisional hernia repair (robotic hernia repair 04/23/22) with mesh, ileus post op on TNA. Developed vomiting and hypotension after NG came out on 04/29/22 and PNA on CXR, was tachycardic and BP improved with IV fluids. DX with sepsis and ABX started. Pt admitted to ICU on 10/2.  Was placed on levophed. Early AM on 10/3 was intubated for worsening metabolic/lactic acidosis. 10/4 cardiac cath with EF 15%; Patricia [name] had AKI as well. Started CRRT. Extubation 10/14 failed; underwent tracheostomy. TC trials started 10/16. Cleared by surgery to attempt POs 10/27.      SLP Plan  Continue with current plan of care      Recommendations for follow up therapy are one component  of a multi-disciplinary discharge planning process, led by the attending physician.  Recommendations may be updated based on patient status, additional functional criteria and insurance authorization.    Recommendations  Diet recommendations: NPO Medication Administration: Via alternative means      Patient may use Passy-Muir Speech Valve: During all waking hours (remove during sleep) PMSV Supervision: Intermittent MD: Please consider changing trach tube to : Cuffless         Oral Care Recommendations: Oral care QID Follow Up Recommendations: Acute inpatient rehab (3hours/day) SLP Visit Diagnosis: Dysphagia, oropharyngeal phase (R13.12) Plan: Continue with current plan of care         Patricia [name] L. Tivis Ringer, MA CCC/SLP Clinical Specialist - Acute Care SLP Acute Rehabilitation Services Office number [phone]   Patricia [name]  06/03/2022, 2:26 PM

## 2022-06-04 DIAGNOSIS — R6521 Severe sepsis with septic shock: Secondary | ICD-10-CM | POA: Diagnosis not present

## 2022-06-04 DIAGNOSIS — J9601 Acute respiratory failure with hypoxia: Secondary | ICD-10-CM | POA: Diagnosis not present

## 2022-06-04 DIAGNOSIS — A419 Sepsis, unspecified organism: Secondary | ICD-10-CM | POA: Diagnosis not present

## 2022-06-04 DIAGNOSIS — J690 Pneumonitis due to inhalation of food and vomit: Secondary | ICD-10-CM | POA: Diagnosis not present

## 2022-06-04 LAB — GLUCOSE, CAPILLARY
Glucose-Capillary: 103 mg/dL — ABNORMAL HIGH (ref 70–99)
Glucose-Capillary: 124 mg/dL — ABNORMAL HIGH (ref 70–99)
Glucose-Capillary: 127 mg/dL — ABNORMAL HIGH (ref 70–99)
Glucose-Capillary: 137 mg/dL — ABNORMAL HIGH (ref 70–99)

## 2022-06-04 LAB — RENAL FUNCTION PANEL
Albumin: 2.3 g/dL — ABNORMAL LOW (ref 3.5–5.0)
Anion gap: 14 (ref 5–15)
BUN: 29 mg/dL — ABNORMAL HIGH (ref 6–20)
CO2: 18 mmol/L — ABNORMAL LOW (ref 22–32)
Calcium: 9.6 mg/dL (ref 8.9–10.3)
Chloride: 111 mmol/L (ref 98–111)
Creatinine, Ser: 1.21 mg/dL — ABNORMAL HIGH (ref 0.44–1.00)
GFR, Estimated: 54 mL/min — ABNORMAL LOW (ref >60)
Glucose, Bld: 132 mg/dL — ABNORMAL HIGH (ref 70–99)
Phosphorus: 4.1 mg/dL (ref 2.5–4.6)
Potassium: 4.1 mmol/L (ref 3.5–5.1)
Sodium: 143 mmol/L (ref 135–145)

## 2022-06-04 LAB — CBC
HCT: 29 % — ABNORMAL LOW (ref 36.0–46.0)
Hemoglobin: 8.7 g/dL — ABNORMAL LOW (ref 12.0–15.0)
MCH: 27.5 pg (ref 26.0–34.0)
MCHC: 30 g/dL (ref 30.0–36.0)
MCV: 91.8 fL (ref 80.0–100.0)
Platelets: 461 10*3/uL — ABNORMAL HIGH (ref 150–400)
RBC: 3.16 MIL/uL — ABNORMAL LOW (ref 3.87–5.11)
RDW: 17 % — ABNORMAL HIGH (ref 11.5–15.5)
WBC: 7.6 10*3/uL (ref 4.0–10.5)
nRBC: 0 % (ref 0.0–0.2)

## 2022-06-04 LAB — MAGNESIUM: Magnesium: 1.9 mg/dL (ref 1.7–2.4)

## 2022-06-04 MED ORDER — VITAL 1.5 CAL PO LIQD
1000.0000 mL | ORAL | Status: DC
  Administered 2022-06-04 – 2022-06-05 (×2): 1000 mL
  Filled 2022-06-04 (×8): qty 1000

## 2022-06-04 MED ORDER — POLYETHYLENE GLYCOL 3350 17 G PO PACK
17.0000 g | PACK | Freq: Every day | ORAL | Status: DC
  Administered 2022-06-05 – 2022-06-20 (×10): 17 g via ORAL
  Filled 2022-06-04 (×15): qty 1

## 2022-06-04 MED ORDER — OXYCODONE HCL 5 MG/5ML PO SOLN
5.0000 mg | ORAL | Status: DC | PRN
  Administered 2022-06-05 – 2022-06-07 (×6): 10 mg
  Administered 2022-06-07: 5 mg
  Administered 2022-06-08 (×2): 10 mg
  Administered 2022-06-09: 5 mg
  Administered 2022-06-10 – 2022-06-13 (×6): 10 mg
  Filled 2022-06-04 (×18): qty 10

## 2022-06-04 MED ORDER — ACETAMINOPHEN 160 MG/5ML PO SOLN
650.0000 mg | ORAL | Status: DC
  Administered 2022-06-04 – 2022-06-13 (×48): 650 mg
  Filled 2022-06-04 (×50): qty 20.3

## 2022-06-04 MED ORDER — PROCHLORPERAZINE EDISYLATE 10 MG/2ML IJ SOLN
10.0000 mg | INTRAMUSCULAR | Status: DC | PRN
  Administered 2022-06-05 – 2022-06-19 (×16): 10 mg via INTRAVENOUS
  Filled 2022-06-04 (×18): qty 2

## 2022-06-04 MED ORDER — METHOCARBAMOL 1000 MG/10ML IJ SOLN
500.0000 mg | Freq: Three times a day (TID) | INTRAVENOUS | Status: DC | PRN
  Administered 2022-06-08 (×2): 500 mg via INTRAVENOUS
  Filled 2022-06-04: qty 500

## 2022-06-04 MED ORDER — HYDROMORPHONE HCL 1 MG/ML IJ SOLN
0.5000 mg | INTRAMUSCULAR | Status: DC | PRN
  Administered 2022-06-04 – 2022-06-13 (×44): 0.5 mg via INTRAVENOUS
  Filled 2022-06-04 (×45): qty 0.5

## 2022-06-04 MED ORDER — CEFAZOLIN SODIUM-DEXTROSE 2-4 GM/100ML-% IV SOLN
2.0000 g | Freq: Three times a day (TID) | INTRAVENOUS | Status: AC
  Administered 2022-06-04 – 2022-06-05 (×4): 2 g via INTRAVENOUS
  Filled 2022-06-04 (×4): qty 100

## 2022-06-04 MED ORDER — GABAPENTIN 250 MG/5ML PO SOLN
200.0000 mg | Freq: Three times a day (TID) | ORAL | Status: DC
  Administered 2022-06-04 – 2022-06-13 (×25): 200 mg
  Filled 2022-06-04 (×35): qty 4

## 2022-06-04 NOTE — Plan of Care (Signed)
  Problem: Education: Goal: Knowledge of General Education information will improve Description: Including pain rating scale, medication(s)/side effects and non-pharmacologic comfort measures Outcome: Progressing   Problem: Clinical Measurements: Goal: Ability to maintain clinical measurements within normal limits will improve Outcome: Progressing Goal: Diagnostic test results will improve Outcome: Progressing Goal: Respiratory complications will improve Outcome: Progressing Goal: Cardiovascular complication will be avoided Outcome: Progressing   Problem: Activity: Goal: Risk for activity intolerance will decrease Outcome: Progressing   Problem: Nutrition: Goal: Adequate nutrition will be maintained Outcome: Progressing   Problem: Coping: Goal: Level of anxiety will decrease Outcome: Progressing   Problem: Elimination: Goal: Will not experience complications related to bowel motility Outcome: Progressing Goal: Will not experience complications related to urinary retention Outcome: Progressing   Problem: Pain Managment: Goal: General experience of comfort will improve Outcome: Progressing   Problem: Safety: Goal: Ability to remain free from injury will improve Outcome: Progressing

## 2022-06-04 NOTE — Progress Notes (Signed)
Pharmacy Antibiotic Note  Patricia [name] is a 51 y.o. female admitted on 04/23/2022 with pneumonia. Tracheal aspirate from 11/4 is positive for rare Klebsiella pneumoniae susceptible to cephalosporins. Pharmacy has been consulted for cefepime dosing.   WBC 7.6, afebrile (Tmax 98.4) SCr down to 1.21 (CrCl~63m/min)  Plan: Per discussion with Dr. [facility]  Discontinue cefepime Start cefazolin 2g IV q8h to end on 11/8 at 11:59 (total of 5 days of abx) Monitor daily CBC, temp, SCr, and for clinical signs of improvement   Height: '5\' 4"'$  (162.6 cm) Weight: 78.7 kg (173 lb 9.6 oz) IBW/kg (Calculated) : 54.7  Temp (24hrs), Avg:98 F (36.7 C), Min:97.5 F (36.4 C), Max:98.4 F (36.9 C)  Recent Labs  Lab 05/30/22 0028 05/31/22 0323 06/01/22 0220 06/02/22 1040 06/02/22 1808 06/03/22 0410 06/04/22 0529  WBC 10.9* 8.3  --   --  8.7 7.7 7.6  CREATININE 1.74* 1.61* 2.03* 1.54*  --  1.34* 1.21*     Estimated Creatinine Clearance: 55.2 mL/min (A) (by C-G formula based on SCr of 1.21 mg/dL (H)).    Allergies  Allergen Reactions   Iodinated Contrast Media Shortness Of Breath, Other (See Comments) and Cough    Throat itching    Peanut-Containing Drug Products Anaphylaxis    Peanut Oil Only.  Can eat peanuts with no problems   Oxycodone-Acetaminophen Itching    Antimicrobials this admission: Cefepime 10/2>>10/2, 11/4>>(11/8) Vanc 10/2>>10/2; 10/6 >10/11 Zosyn 10/3>>10/9 Merrem 10/9 >>10/19  Microbiology results:  10/3 Bcx x 2: Neg 10/2 BCx - neg 10/2 UCx - neg 10/9 TA - neg 10/14 TA - neg 11/4 TA - rare Kleb pneumo (S: cephalosporins, cipro, gent, imipenem, amp/sulbactam, and pip/tazo)  Thank you for allowing pharmacy to be a part of this patient's care.  CLuisa Hart PharmD, BCPS Clinical Pharmacist 06/04/2022 11:06 AM   Please refer to AMION for pharmacy phone number

## 2022-06-04 NOTE — TOC Progression Note (Addendum)
Transition of Care [facility]) - Progression Note    Patient Details  Name: Patricia [name] MRN: [id number] Date of Birth: 1970-11-28  [facility] CM/SW Contact  Reece Agar, [place] Phone Number: 06/04/2022, 11:16 AM  Clinical Narrative:    CSW spoke with pt sister about not having any SNF offers. Pt sister would like for pt to be reassessed for the hospital inpatient rehab. Sister also asked CSW to contact [facility] in [place], [place]. CSW sent a message to inpatient rehab to inquire about getting pt reassessed. CSW contacted [facility], no medicaid beds available.   CIR does not think pt has the tolerance and will not be able to accept but suggested following up with other AIR options outside of [facility].  CSW contacted [facility],they do not have any medicaid beds.   CSW will continue to follow.    Expected Discharge Plan: [place] Barriers to Discharge: Continued Medical Work up  Expected Discharge Plan and Services Expected Discharge Plan: [place]   Discharge Planning Services: CM Consult   Living arrangements for the past 2 months: Single Family Home                                       Social Determinants of Health (SDOH) Interventions    Readmission Risk Interventions     No data to display

## 2022-06-04 NOTE — Progress Notes (Signed)
Mobility Specialist Progress Note    06/04/22 1102  Mobility  Activity Transferred from bed to chair  Level of Assistance +2 (takes two people)  Assistive Device MaxiMove  Activity Response Tolerated well  Mobility Referral Yes  $Mobility charge 1 Mobility   Post-Mobility: 90 HR, 98% SpO2  Pt received and agreeable. Once in chair c/o nausea. RN notified. Left with call bell in reach.   Hildred Alamin Mobility Specialist  Secure Chat Only

## 2022-06-04 NOTE — Progress Notes (Signed)
PROGRESS NOTE    Patricia [name]  FXJ:[id number] DOB: [birth year] DOA: 04/23/2022 PCP: [name], MD   Brief Narrative:   51 year old with history of DM2, depression, migraine, asthma, schizophrenia, HTN, HLD presented to the hospital for exertional hernia, robotic hernia repair on 04/23/2022 with mesh who developed postop ileus and is on TNA.  Also patient developed vomiting and hypotension after NG tube came out on 04/29/2022 with concerns of pneumonia which was treated with antibiotics and IV fluids.  During that time patient also required Levophed and eventually intubated on 10/3 for worsening metabolic acidosis complicated by acute kidney injury.  Echocardiogram showed depressed EF of 30-35% with elevated troponin.  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  Patient was seen by CHF team started on dopamine and CRRT as well.  Patient was again reintubated due to neuromuscular weakness on 10/8.  Eventually extubated on 10/14 to BiPAP and trach was placed due to weakness.  Tube feeds held due to nausea vomiting on 10/30.  Hospital course complicated by hypernatremia, she developed AKI, requiring brief course of HD, renal function has recovered.   Assessment & Plan:  Principal Problem:   Septic shock ([place]) Active Problems:   Hernia of abdominal cavity   Aspiration pneumonia (HCC)   Prolonged QT interval   Diabetes mellitus type 2 in obese (HCC)   BIPOLAR DISORDER UNSPECIFIED   Morbid obesity (HCC)   Chronic low back pain   Essential hypertension   Acute systolic heart failure (HCC)   Acute respiratory failure with hypoxia (HCC)   Pressure injury of skin   Tracheostomy dependence (HCC)   Dysphagia     Assessment and Plan:  * Septic shock ([place]) secondary to aspiration pneumonia, right lower lobe Acute respiratory failure with prolonged mechanical ventilation status post tracheostomy -Trach management by pulmonary team. - Bronchodilators as needed,  supportive care.  Out of bed to chair -toelrating PMV -Other aspiration event over the weekend, started empirically on cefepime, sputum culture growing Klebsiella, narrowed to cefepime to finish antibiotics in 2 days.   -Patient failed MBS on Friday, remains n.p.o., only ice chips, feeding via cortrak only  Status post hernia repair complicated by postop ileus/inflammatory enteritis Intermittent nausea vomiting -Management by general surgery.  She is working with speech and swallow.  Maintain n.p.o. status, PMV.  Tube feeds -Management per primary surgical team, repeat CT abdomen with no acute abdominal findings.  Hypernatremia - resolved, if sodium started to increase then will start on D5W , as surgery would like to avoid excessive free water via cortrak, as it usually causes nausea, vomiting and abdominal pain .  AKI -Likely due to ATN in the setting of cardiogenic/septic shock, required CRRT 10/410/14, first HD on 10/16, renal function has recovered.  Right knee pain - Unclear etiology, no evidence of trauma.  Negative x-ray  Acute kidney injury, improving -Initially required CRRT and HD.  Now improving without dialysis  Biventricular failure, reduced EF - Last seen by CHF team 10/16.  Previously tried Bumex with minimal to no urine output.  At this point patient is on dialysis and has been well compensated.  Plans to follow-up outpatient. LHC shows clean coronaries.   Sinus tachycardia; improved.  - Now on metoprolol 50 mg twice daily.  IV as needed  Anemia of chronic disease - From underlying multiple medical issues.  Transfuse as necessary and attempt to keep hemoglobin greater than 8  Essential hypertension -Currently on metoprolol twice daily -We will add  as needed hydralazine  Chronic low back pain -As needed pain control  Morbid obesity (HCC) -Body mass index is 41.37 kg/m..  Currently on tube feeds -Follow-up outpatient, weight loss diet and exercise  BIPOLAR  DISORDER UNSPECIFIED -On Klonopin.  Should slowly resume home meds as appropriate  Diabetes mellitus type 2 in obese (HCC) -Sliding scale and Accu-Cheks.  Pressure area on the lip in the setting of ET tube placement which was not present prior to admission.  Routine wound care.  PT-CIR      DVT prophylaxis: Subcu heparin Code Status: Full code Family Communication:  none at bedside  Final dispo per general surgery  Nutritional status    Signs/Symptoms: NPO status (with need for MBS per SLP)  Interventions: Refer to RD note for recommendations, Tube feeding, MVI  Body mass index is 29.8 kg/m.       Subjective:  No significant events overnight, reported good night sleep, she had good BM today.  Examination:    Awake Alert, Oriented X 3,PMV with tracheostomy, she is conversant, appropriate and pleasant Symmetrical Chest wall movement, Good air movement bilaterally, CTAB RRR,No Gallops,Rubs or new Murmurs, No Parasternal Heave +ve B.Sounds, Abd Soft, No tenderness, No rebound - guarding or rigidity. Bilateral Unna boots    Tracheostomy in place Tube feeding(CORTRAK) in place  Objective: Vitals:   06/04/22 0455 06/04/22 0500 06/04/22 0745 06/04/22 1049  BP: (!) 140/96  (!) 130/95 (!) 128/102  Pulse: 91  91 90  Resp: '19  18 20  '$ Temp: 97.9 F (36.6 C)  98 F (36.7 C) 97.6 F (36.4 C)  TempSrc: Oral  Oral Axillary  SpO2: 99%  100% 100%  Weight:  78.7 kg    Height:        Intake/Output Summary (Last 24 hours) at 06/04/2022 1429 Last data filed at 06/04/2022 1038 Gross per 24 hour  Intake 1832.16 ml  Output 1325 ml  Net 507.16 ml    Filed Weights   06/01/22 0500 06/03/22 0500 06/04/22 0500  Weight: 77.8 kg 80.4 kg 78.7 kg     Data Reviewed:   CBC: Recent Labs  Lab 05/30/22 0028 05/31/22 0323 06/02/22 1808 06/03/22 0410 06/04/22 0529  WBC 10.9* 8.3 8.7 7.7 7.6  HGB 8.4* 7.9* 7.9* 7.8* 8.7*  HCT 28.0* 25.9* 25.7* 26.2* 29.0*  MCV 91.8  91.5 90.2 91.6 91.8  PLT 591* 518* 435* 448* 235*   Basic Metabolic Panel: Recent Labs  Lab 05/31/22 0323 06/01/22 0220 06/02/22 1040 06/03/22 0410 06/04/22 0529  NA 143 147* 144 141 143  K 3.4* 4.2 3.8 3.6 4.1  CL 112* 117* 117* 114* 111  CO2 23 20* 19* 20* 18*  GLUCOSE 122* 107* 152* 125* 132*  BUN 43* 43* 34* 28* 29*  CREATININE 1.61* 2.03* 1.54* 1.34* 1.21*  CALCIUM 8.6* 8.7* 8.8* 8.9 9.6  MG 1.7 1.8 1.8 1.8 1.9  PHOS 4.4 4.4 3.8 3.9 4.1   GFR: Estimated Creatinine Clearance: 55.2 mL/min (A) (by C-G formula based on SCr of 1.21 mg/dL (H)). Liver Function Tests: Recent Labs  Lab 05/29/22 0644 05/30/22 0028 05/31/22 0323 06/01/22 0220 06/02/22 1040 06/03/22 0410 06/04/22 0529  AST 113*  --   --   --   --   --   --   ALT 197*  --   --   --   --   --   --   ALKPHOS 115  --   --   --   --   --   --  BILITOT 0.4  --   --   --   --   --   --   PROT 8.8*  --   --   --   --   --   --   ALBUMIN 2.4*  2.4*   < > 2.0* 2.3* 2.1* 2.1* 2.3*   < > = values in this interval not displayed.   No results for input(s): "LIPASE", "AMYLASE" in the last 168 hours. No results for input(s): "AMMONIA" in the last 168 hours. Coagulation Profile: No results for input(s): "INR", "PROTIME" in the last 168 hours. Cardiac Enzymes: No results for input(s): "CKTOTAL", "CKMB", "CKMBINDEX", "TROPONINI" in the last 168 hours. BNP (last 3 results) No results for input(s): "PROBNP" in the last 8760 hours. HbA1C: No results for input(s): "HGBA1C" in the last 72 hours. CBG: Recent Labs  Lab 06/03/22 1154 06/03/22 1726 06/03/22 2328 06/04/22 0530 06/04/22 1144  GLUCAP 130* 117* 151* 124* 127*   Lipid Profile: No results for input(s): "CHOL", "HDL", "LDLCALC", "TRIG", "CHOLHDL", "LDLDIRECT" in the last 72 hours. Thyroid Function Tests: No results for input(s): "TSH", "T4TOTAL", "FREET4", "T3FREE", "THYROIDAB" in the last 72 hours. Anemia Panel: No results for input(s): "VITAMINB12",  "FOLATE", "FERRITIN", "TIBC", "IRON", "RETICCTPCT" in the last 72 hours. Sepsis Labs: No results for input(s): "PROCALCITON", "LATICACIDVEN" in the last 168 hours.  Recent Results (from the past 240 hour(s))  Culture, Respiratory w Gram Stain     Status: None   Collection Time: 06/01/22  8:39 AM   Specimen: Tracheal Aspirate; Respiratory  Result Value Ref Range Status   Specimen Description TRACHEAL ASPIRATE  Final   Special Requests NONE  Final   Gram Stain   Final    ABUNDANT WBC PRESENT, PREDOMINANTLY PMN MODERATE GRAM POSITIVE COCCI IN CHAINS IN PAIRS    Culture   Final    RARE KLEBSIELLA PNEUMONIAE WITHIN NORMAL RESPIRATORY FLORA Performed at [facility], [place] [address] [place], [place] [address]    Report Status 06/03/2022 FINAL  Final   Organism ID, Bacteria KLEBSIELLA PNEUMONIAE  Final      Susceptibility   Klebsiella pneumoniae - MIC*    AMPICILLIN >=32 RESISTANT Resistant     CEFAZOLIN <=4 SENSITIVE Sensitive     CEFEPIME <=0.12 SENSITIVE Sensitive     CEFTAZIDIME <=1 SENSITIVE Sensitive     CEFTRIAXONE <=0.25 SENSITIVE Sensitive     CIPROFLOXACIN <=0.25 SENSITIVE Sensitive     GENTAMICIN <=1 SENSITIVE Sensitive     IMIPENEM <=0.25 SENSITIVE Sensitive     TRIMETH/SULFA >=320 RESISTANT Resistant     AMPICILLIN/SULBACTAM 8 SENSITIVE Sensitive     PIP/TAZO <=4 SENSITIVE Sensitive     * RARE KLEBSIELLA PNEUMONIAE         Radiology Studies: No results found.      Scheduled Meds:  acetaminophen (TYLENOL) oral liquid 160 mg/5 mL  650 mg Per Tube Q4H   ascorbic acid  250 mg Oral Daily   clonazePAM  0.25 mg Per Tube QHS   copper  2 mg Per Tube Daily   feeding supplement (PROSource TF20)  60 mL Per Tube Daily   fiber  1 packet Per Tube BID   gabapentin  200 mg Per Tube Q8H   heparin injection (subcutaneous)  5,000 Units Subcutaneous Q8H   insulin aspart  0-20 Units Subcutaneous Q6H   insulin aspart  4 Units Subcutaneous Q6H   metoprolol tartrate  50  mg Per Tube BID   multivitamin with minerals  1 tablet Per Tube Daily   nystatin   Topical TID   mouth rinse  15 mL Mouth Rinse 4 times per day   pantoprazole (PROTONIX) IV  40 mg Intravenous Daily   polyethylene glycol  17 g Oral Daily   QUEtiapine  25 mg Per Tube QHS   senna  1 tablet Per Tube Daily   zinc sulfate  220 mg Oral Daily   Continuous Infusions:   ceFAZolin (ANCEF) IV     feeding supplement (PEPTAMEN 1.5 CAL) 1,000 mL (06/04/22 0920)   methocarbamol (ROBAXIN) IV       LOS: 41 days      Phillips Climes, MD [facility]  If 7PM-7AM, please contact night-coverage  06/04/2022, 2:29 PM

## 2022-06-04 NOTE — Progress Notes (Signed)
Inpatient Rehabilitation Admissions Coordinator   Asked by [facility] to reassess patient for possible CIR level rehab. Therapy treatments reviewed since 11/1 assessment. She continues to not demonstrate the tolerance for CIR level rehab.Other rehab venues should be pursued either SNF or other AIR venues.  Danne Baxter, RN, MSN Rehab Admissions Coordinator [phone] 06/04/2022 11:47 AM

## 2022-06-04 NOTE — Progress Notes (Signed)
Dr. Carlis Abbott CCM  made aware that patient trach was due to be changed 10/25 and has not been done so yet. Verbal order received to change pt trach to a #6 cuffless Shiley. Okay to change 06/05/22. RT will continue to monitor and be available.

## 2022-06-04 NOTE — Progress Notes (Signed)
34 Days Post-Op   Subjective/Chief Complaint:  Using pain medication regularly.  No major changes.    Objective: Vital signs in last 24 hours: Temp:  [97.5 F (36.4 C)-98.4 F (36.9 C)] 97.6 F (36.4 C) (11/07 1049) Pulse Rate:  [86-198] 90 (11/07 1049) Resp:  [17-24] 20 (11/07 1049) BP: (122-141)/(84-102) 128/102 (11/07 1049) SpO2:  [92 %-100 %] 100 % (11/07 1049) FiO2 (%):  [28 %] 28 % (11/07 0745) Weight:  [78.7 kg] 78.7 kg (11/07 0500) Last BM Date : 06/02/22  Intake/Output from previous day: 11/06 0701 - 11/07 0700 In: 1832.2 [I.V.:40; NG/GT:1540; IV Piggyback:252.2] Out: 1325 [Urine:1325] Intake/Output this shift: Total I/O In: -  Out: 300 [Urine:300]  Trach in place Abd - soft, Nontender  Lab Results:  Recent Labs    06/03/22 0410 06/04/22 0529  WBC 7.7 7.6  HGB 7.8* 8.7*  HCT 26.2* 29.0*  PLT 448* 461*    BMET Recent Labs    06/03/22 0410 06/04/22 0529  NA 141 143  K 3.6 4.1  CL 114* 111  CO2 20* 18*  GLUCOSE 125* 132*  BUN 28* 29*  CREATININE 1.34* 1.21*  CALCIUM 8.9 9.6    PT/INR No results for input(s): "LABPROT", "INR" in the last 72 hours. ABG No results for input(s): "PHART", "HCO3" in the last 72 hours.  Invalid input(s): "PCO2", "PO2"  Studies/Results: No results found.  Anti-infectives: Anti-infectives (From admission, onward)    Start     Dose/Rate Route Frequency Ordered Stop   06/04/22 2100  ceFAZolin (ANCEF) IVPB 2g/100 mL premix        2 g 200 mL/hr over 30 Minutes Intravenous Every 8 hours 06/04/22 1103 06/06/22 0459   06/01/22 1515  ceFEPIme (MAXIPIME) 2 g in sodium chloride 0.9 % 100 mL IVPB  Status:  Discontinued        2 g 200 mL/hr over 30 Minutes Intravenous Every 12 hours 06/01/22 1420 06/04/22 1103   05/15/22 1445  ceFAZolin (ANCEF) IVPB 2g/100 mL premix       Note to Pharmacy: To be given in IR   2 g 200 mL/hr over 30 Minutes Intravenous  Once 05/15/22 1352 05/15/22 1616   05/15/22 1230  ceFAZolin  (ANCEF) powder 1 g  Status:  Discontinued        1 g Other To Surgery 05/15/22 1140 05/15/22 1239   05/15/22 1230  ceFAZolin (ANCEF) IVPB 2g/100 mL premix        2 g 200 mL/hr over 30 Minutes Intravenous To Radiology 05/15/22 1140 05/15/22 1140   05/15/22 0000  ceFAZolin (ANCEF) IVPB 2g/100 mL premix        2 g 200 mL/hr over 30 Minutes Intravenous To Radiology 05/14/22 1308 05/15/22 0005   05/13/22 2134  meropenem (MERREM) 500 mg in sodium chloride 0.9 % 100 mL IVPB        500 mg 200 mL/hr over 30 Minutes Intravenous Every 24 hours 05/13/22 0815 05/17/22 0700   05/13/22 1000  meropenem (MERREM) 500 mg in sodium chloride 0.9 % 100 mL IVPB  Status:  Discontinued        500 mg 200 mL/hr over 30 Minutes Intravenous Every 12 hours 05/12/22 0940 05/12/22 0946   05/12/22 2200  meropenem (MERREM) 500 mg in sodium chloride 0.9 % 100 mL IVPB  Status:  Discontinued        500 mg 200 mL/hr over 30 Minutes Intravenous Every 12 hours 05/12/22 0946 05/13/22 0815   05/12/22 2000  meropenem (MERREM) 1 g in sodium chloride 0.9 % 100 mL IVPB  Status:  Discontinued        1 g 200 mL/hr over 30 Minutes Intravenous  Once 05/12/22 0940 05/12/22 0946   05/06/22 1530  meropenem (MERREM) 1 g in sodium chloride 0.9 % 100 mL IVPB  Status:  Discontinued        1 g 200 mL/hr over 30 Minutes Intravenous Every 8 hours 05/06/22 1438 05/12/22 0940   05/04/22 1000  vancomycin (VANCOCIN) IVPB 1000 mg/200 mL premix  Status:  Discontinued        1,000 mg 200 mL/hr over 60 Minutes Intravenous Every 24 hours 05/03/22 0802 05/08/22 0913   05/03/22 0845  vancomycin (VANCOREADY) IVPB 2000 mg/400 mL        2,000 mg 200 mL/hr over 120 Minutes Intravenous  Once 05/03/22 0751 05/03/22 1114   05/01/22 1800  piperacillin-tazobactam (ZOSYN) IVPB 3.375 g  Status:  Discontinued        3.375 g 12.5 mL/hr over 240 Minutes Intravenous Every 6 hours 05/01/22 1426 05/01/22 1427   05/01/22 1515  piperacillin-tazobactam (ZOSYN) IVPB 3.375 g   Status:  Discontinued        3.375 g 100 mL/hr over 30 Minutes Intravenous Every 6 hours 05/01/22 1428 05/06/22 1438   04/30/22 1400  piperacillin-tazobactam (ZOSYN) IVPB 2.25 g  Status:  Discontinued        2.25 g 100 mL/hr over 30 Minutes Intravenous Every 8 hours 04/30/22 1325 05/01/22 1426   04/29/22 1530  piperacillin-tazobactam (ZOSYN) IVPB 3.375 g  Status:  Discontinued        3.375 g 12.5 mL/hr over 240 Minutes Intravenous Every 8 hours 04/29/22 1433 04/30/22 1325   04/29/22 1145  metroNIDAZOLE (FLAGYL) IVPB 500 mg  Status:  Discontinued        500 mg 100 mL/hr over 60 Minutes Intravenous Every 12 hours 04/29/22 1047 04/29/22 1433   04/29/22 1145  vancomycin (VANCOCIN) IVPB 1000 mg/200 mL premix  Status:  Discontinued        1,000 mg 200 mL/hr over 60 Minutes Intravenous  Once 04/29/22 1047 04/29/22 1055   04/29/22 1145  vancomycin (VANCOREADY) IVPB 1500 mg/300 mL  Status:  Discontinued        1,500 mg 150 mL/hr over 120 Minutes Intravenous Every 24 hours 04/29/22 1055 04/29/22 1357   04/29/22 1045  ceFEPIme (MAXIPIME) 2 g in sodium chloride 0.9 % 100 mL IVPB  Status:  Discontinued        2 g 200 mL/hr over 30 Minutes Intravenous Every 8 hours 04/29/22 0954 04/29/22 1433   04/23/22 0730  ceFAZolin (ANCEF) IVPB 2g/100 mL premix        2 g 200 mL/hr over 30 Minutes Intravenous On call to O.R. 04/23/22 0726 04/23/22 0828       Assessment/Plan: Patricia [name] is s/p robotic incisional hernia repair on 04/23/22 - Tube feeds to goal - Adjust pain medication - Okay for diet as deemed fit by speech therapy team   Acute metabolic encephalopathy superimposed on hx bipolar disorder, schizophrenia   ICU delirium- improved now off all IV infusions. Acute biventricular HFrEF (Etiology sepsis, stress induced CM. Has clean coronaries) Cardiogenic shock resolved, EF improved.  Acute hypoxic respiratory failure Aspiration PNA Pulm edema   RLL VAP  AKI with oliguria; no baseline CKD -  appears to have some renal recovery, but Cr up some yesterday but good UOP Anemia, acute on chronic  DM2 with hyperglycemia  Severe protein calorie malnutrition  Pressure injury lip - improving  Deconditioning    Appreciate [facility] care with multiple issues   Needs therapy and recouperation     Dispo - Medically stable for discharge.  Difficult placement.   LOS: 41 days    Patricia [name] 06/04/2022

## 2022-06-05 DIAGNOSIS — A419 Sepsis, unspecified organism: Secondary | ICD-10-CM | POA: Diagnosis not present

## 2022-06-05 DIAGNOSIS — R6521 Severe sepsis with septic shock: Secondary | ICD-10-CM | POA: Diagnosis not present

## 2022-06-05 LAB — RENAL FUNCTION PANEL
Albumin: 2.4 g/dL — ABNORMAL LOW (ref 3.5–5.0)
Anion gap: 8 (ref 5–15)
BUN: 27 mg/dL — ABNORMAL HIGH (ref 6–20)
CO2: 17 mmol/L — ABNORMAL LOW (ref 22–32)
Calcium: 9.3 mg/dL (ref 8.9–10.3)
Chloride: 114 mmol/L — ABNORMAL HIGH (ref 98–111)
Creatinine, Ser: 1.1 mg/dL — ABNORMAL HIGH (ref 0.44–1.00)
GFR, Estimated: >60 mL/min (ref >60)
Glucose, Bld: 139 mg/dL — ABNORMAL HIGH (ref 70–99)
Phosphorus: 4.6 mg/dL (ref 2.5–4.6)
Potassium: 4.1 mmol/L (ref 3.5–5.1)
Sodium: 139 mmol/L (ref 135–145)

## 2022-06-05 LAB — CBC
HCT: 28.7 % — ABNORMAL LOW (ref 36.0–46.0)
Hemoglobin: 8.6 g/dL — ABNORMAL LOW (ref 12.0–15.0)
MCH: 27.2 pg (ref 26.0–34.0)
MCHC: 30 g/dL (ref 30.0–36.0)
MCV: 90.8 fL (ref 80.0–100.0)
Platelets: 460 10*3/uL — ABNORMAL HIGH (ref 150–400)
RBC: 3.16 MIL/uL — ABNORMAL LOW (ref 3.87–5.11)
RDW: 17.1 % — ABNORMAL HIGH (ref 11.5–15.5)
WBC: 8.2 10*3/uL (ref 4.0–10.5)
nRBC: 0 % (ref 0.0–0.2)

## 2022-06-05 LAB — GLUCOSE, CAPILLARY
Glucose-Capillary: 161 mg/dL — ABNORMAL HIGH (ref 70–99)
Glucose-Capillary: 175 mg/dL — ABNORMAL HIGH (ref 70–99)
Glucose-Capillary: 116 mg/dL — ABNORMAL HIGH (ref 70–99)

## 2022-06-05 MED ORDER — CARBAMIDE PEROXIDE 6.5 % OT SOLN
5.0000 [drp] | Freq: Two times a day (BID) | OTIC | Status: DC
  Administered 2022-06-05 – 2022-06-06 (×3): 5 [drp] via OTIC
  Filled 2022-06-05 (×2): qty 15

## 2022-06-05 MED ORDER — NUTRISOURCE FIBER PO PACK
1.0000 | PACK | Freq: Three times a day (TID) | ORAL | Status: DC
  Administered 2022-06-05 – 2022-06-20 (×35): 1
  Filled 2022-06-05 (×52): qty 1

## 2022-06-05 NOTE — Progress Notes (Signed)
[employer] supplies has not arrived. RT will attempt to obtain again. RT will continue to monitor and be available.

## 2022-06-05 NOTE — Progress Notes (Signed)
RT requested a #6 cuffless shiley so pt trach can be changed today.

## 2022-06-05 NOTE — Progress Notes (Signed)
Patient transported with belongings to [phone].  Patient's sister made aware of transfer.

## 2022-06-05 NOTE — Plan of Care (Signed)
  Problem: Education: Goal: Knowledge of General Education information will improve Description: Including pain rating scale, medication(s)/side effects and non-pharmacologic comfort measures Outcome: Progressing   Problem: Health Behavior/Discharge Planning: Goal: Ability to manage health-related needs will improve Outcome: Progressing   Problem: Clinical Measurements: Goal: Ability to maintain clinical measurements within normal limits will improve Outcome: Progressing Goal: Will remain free from infection Outcome: Progressing Goal: Respiratory complications will improve Outcome: Progressing Goal: Cardiovascular complication will be avoided Outcome: Progressing   Problem: Activity: Goal: Risk for activity intolerance will decrease Outcome: Progressing   Problem: Nutrition: Goal: Adequate nutrition will be maintained Outcome: Progressing   Problem: Elimination: Goal: Will not experience complications related to urinary retention Outcome: Progressing   Problem: Fluid Volume: Goal: Ability to maintain a balanced intake and output will improve Outcome: Progressing   Problem: Metabolic: Goal: Ability to maintain appropriate glucose levels will improve Outcome: Progressing

## 2022-06-05 NOTE — Progress Notes (Addendum)
PROGRESS NOTE    Patricia [name]  OVZ:[id number] DOB: [birth year] DOA: 04/23/2022 PCP: [name], MD   Brief Narrative:   51 year old with history of DM2, depression, migraine, asthma, schizophrenia, HTN, HLD presented to the hospital for exertional hernia, robotic hernia repair on 04/23/2022 with mesh who developed postop ileus and is on TNA.  Also patient developed vomiting and hypotension after NG tube came out on 04/29/2022 with concerns of pneumonia which was treated with antibiotics and IV fluids.  During that time patient also required Levophed and eventually intubated on 10/3 for worsening metabolic acidosis complicated by acute kidney injury.  Echocardiogram showed depressed EF of 30-35% with elevated troponin.  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  Patient was seen by CHF team started on dopamine and CRRT as well.  Patient was again reintubated due to neuromuscular weakness on 10/8.  Eventually extubated on 10/14 to BiPAP and trach was placed due to weakness.  Tube feeds held due to nausea vomiting on 10/30.  Hospital course complicated by hypernatremia, she developed AKI, requiring brief course of HD, renal function has recovered.  Assessment and Plan:  * Septic shock ([place]) secondary to aspiration pneumonia, right lower lobe Acute respiratory failure with prolonged mechanical ventilation status post tracheostomy -Trach management by pulmonary team. - Bronchodilators as needed, supportive care.  Out of bed to chair -toelrating PMV -Other aspiration event over the weekend, started empirically on cefepime, sputum culture growing Klebsiella, them ancef, completed Abx today  -Patient failed MBS on Friday, remains n.p.o., only ice chips, feeding via cortrak only -diarrhea on tube feeds, increase fiber  Status post hernia repair complicated by postop ileus/inflammatory enteritis Intermittent nausea vomiting -Management by general surgery.  She is working  with speech and swallow.  Maintain n.p.o. status, PMV.  Tube feeds -Management per primary surgical team, repeat CT abdomen with no acute abdominal findings.  Hypernatremia - resolved, if sodium started to increase then will start on D5W , as surgery would like to avoid excessive free water via cortrak, as it usually causes nausea, vomiting and abdominal pain .  AKI -Likely due to ATN in the setting of cardiogenic/septic shock, required CRRT 10/410/14, first HD on 10/16, renal function has recovered.  Right knee pain - Unclear etiology, no evidence of trauma.  Negative x-ray  Acute kidney injury, improving -Initially required CRRT and HD.  Now improving without dialysis  Biventricular failure, reduced EF - Last seen by CHF team 10/16.  Previously tried Bumex with minimal to no urine output.  At this point patient is on dialysis and has been well compensated.  Plans to follow-up outpatient. LHC shows clean coronaries.   Sinus tachycardia; improved.  - Now on metoprolol 50 mg twice daily.  IV as needed  Anemia of chronic disease - From underlying multiple medical issues.  Transfuse as necessary and attempt to keep hemoglobin greater than 8  Essential hypertension -Currently on metoprolol twice daily -We will add as needed hydralazine  Chronic low back pain -As needed pain control  Morbid obesity (HCC) -Body mass index is 41.37 kg/m..  Currently on tube feeds -Follow-up outpatient, weight loss diet and exercise  BIPOLAR DISORDER UNSPECIFIED -On Klonopin.  Should slowly resume home meds as appropriate  Diabetes mellitus type 2 in obese (HCC) -Sliding scale and Accu-Cheks. -stable  Pressure area on the lip in the setting of ET tube placement which was not present prior to admission.  Routine wound care.  PT-CIR  DVT prophylaxis: Subcu heparin Code Status: Full code Family Communication:  none at bedside  Final dispo per general surgery  Nutritional  status    Signs/Symptoms: NPO status (with need for MBS per SLP)  Interventions: Refer to RD note for recommendations, Tube feeding, MVI  Body mass index is 28 kg/m.       Subjective:  Some diarrhea,  fuziness in hearing  Examination:  Gen: Awake, Alert, Oriented X 3,  HEENT: cortrak and Trach collar Lungs: Good air movement bilaterally, CTAB CVS: S1S2/RRR Abd: soft, Non tender, non distended, BS present Extremities: No edema Skin: no new rashes on exposed skin     Tracheostomy in place Tube feeding(CORTRAK) in place  Objective: Vitals:   06/05/22 0300 06/05/22 0353 06/05/22 0500 06/05/22 0722  BP: 128/88 128/88  137/88  Pulse: 96 99  (!) 110  Resp: (!) 24 (!) 24  13  Temp: 98.7 F (37.1 C)   97.6 F (36.4 C)  TempSrc: Axillary   Oral  SpO2: 98% 100%  97%  Weight:   74 kg   Height:        Intake/Output Summary (Last 24 hours) at 06/05/2022 0745 Last data filed at 06/05/2022 0612 Gross per 24 hour  Intake 689.5 ml  Output 1000 ml  Net -310.5 ml    Filed Weights   06/03/22 0500 06/04/22 0500 06/05/22 0500  Weight: 80.4 kg 78.7 kg 74 kg     Data Reviewed:   CBC: Recent Labs  Lab 05/31/22 0323 06/02/22 1808 06/03/22 0410 06/04/22 0529 06/05/22 0638  WBC 8.3 8.7 7.7 7.6 8.2  HGB 7.9* 7.9* 7.8* 8.7* 8.6*  HCT 25.9* 25.7* 26.2* 29.0* 28.7*  MCV 91.5 90.2 91.6 91.8 90.8  PLT 518* 435* 448* 461* 115*   Basic Metabolic Panel: Recent Labs  Lab 05/31/22 0323 06/01/22 0220 06/02/22 1040 06/03/22 0410 06/04/22 0529 06/05/22 0638  NA 143 147* 144 141 143 139  K 3.4* 4.2 3.8 3.6 4.1 4.1  CL 112* 117* 117* 114* 111 114*  CO2 23 20* 19* 20* 18* 17*  GLUCOSE 122* 107* 152* 125* 132* 139*  BUN 43* 43* 34* 28* 29* 27*  CREATININE 1.61* 2.03* 1.54* 1.34* 1.21* 1.10*  CALCIUM 8.6* 8.7* 8.8* 8.9 9.6 9.3  MG 1.7 1.8 1.8 1.8 1.9  --   PHOS 4.4 4.4 3.8 3.9 4.1 4.6   GFR: Estimated Creatinine Clearance: 58.9 mL/min (A) (by C-G formula based on SCr  of 1.1 mg/dL (H)). Liver Function Tests: Recent Labs  Lab 06/01/22 0220 06/02/22 1040 06/03/22 0410 06/04/22 0529 06/05/22 7262  ALBUMIN 2.3* 2.1* 2.1* 2.3* 2.4*   No results for input(s): "LIPASE", "AMYLASE" in the last 168 hours. No results for input(s): "AMMONIA" in the last 168 hours. Coagulation Profile: No results for input(s): "INR", "PROTIME" in the last 168 hours. Cardiac Enzymes: No results for input(s): "CKTOTAL", "CKMB", "CKMBINDEX", "TROPONINI" in the last 168 hours. BNP (last 3 results) No results for input(s): "PROBNP" in the last 8760 hours. HbA1C: No results for input(s): "HGBA1C" in the last 72 hours. CBG: Recent Labs  Lab 06/04/22 0530 06/04/22 1144 06/04/22 1800 06/04/22 2341 06/05/22 0612  GLUCAP 124* 127* 103* 137* 161*   Lipid Profile: No results for input(s): "CHOL", "HDL", "LDLCALC", "TRIG", "CHOLHDL", "LDLDIRECT" in the last 72 hours. Thyroid Function Tests: No results for input(s): "TSH", "T4TOTAL", "FREET4", "T3FREE", "THYROIDAB" in the last 72 hours. Anemia Panel: No results for input(s): "VITAMINB12", "FOLATE", "FERRITIN", "TIBC", "IRON", "RETICCTPCT" in the last 72  hours. Sepsis Labs: No results for input(s): "PROCALCITON", "LATICACIDVEN" in the last 168 hours.  Recent Results (from the past 240 hour(s))  Culture, Respiratory w Gram Stain     Status: None   Collection Time: 06/01/22  8:39 AM   Specimen: Tracheal Aspirate; Respiratory  Result Value Ref Range Status   Specimen Description TRACHEAL ASPIRATE  Final   Special Requests NONE  Final   Gram Stain   Final    ABUNDANT WBC PRESENT, PREDOMINANTLY PMN MODERATE GRAM POSITIVE COCCI IN CHAINS IN PAIRS    Culture   Final    RARE KLEBSIELLA PNEUMONIAE WITHIN NORMAL RESPIRATORY FLORA Performed at [facility], [place] [address] [place], [place] [address]    Report Status 06/03/2022 FINAL  Final   Organism ID, Bacteria KLEBSIELLA PNEUMONIAE  Final      Susceptibility    Klebsiella pneumoniae - MIC*    AMPICILLIN >=32 RESISTANT Resistant     CEFAZOLIN <=4 SENSITIVE Sensitive     CEFEPIME <=0.12 SENSITIVE Sensitive     CEFTAZIDIME <=1 SENSITIVE Sensitive     CEFTRIAXONE <=0.25 SENSITIVE Sensitive     CIPROFLOXACIN <=0.25 SENSITIVE Sensitive     GENTAMICIN <=1 SENSITIVE Sensitive     IMIPENEM <=0.25 SENSITIVE Sensitive     TRIMETH/SULFA >=320 RESISTANT Resistant     AMPICILLIN/SULBACTAM 8 SENSITIVE Sensitive     PIP/TAZO <=4 SENSITIVE Sensitive     * RARE KLEBSIELLA PNEUMONIAE         Radiology Studies: No results found.      Scheduled Meds:  acetaminophen (TYLENOL) oral liquid 160 mg/5 mL  650 mg Per Tube Q4H   ascorbic acid  250 mg Oral Daily   copper  2 mg Per Tube Daily   feeding supplement (PROSource TF20)  60 mL Per Tube Daily   fiber  1 packet Per Tube TID   gabapentin  200 mg Per Tube Q8H   heparin injection (subcutaneous)  5,000 Units Subcutaneous Q8H   insulin aspart  0-20 Units Subcutaneous Q6H   insulin aspart  4 Units Subcutaneous Q6H   metoprolol tartrate  50 mg Per Tube BID   multivitamin with minerals  1 tablet Per Tube Daily   nystatin   Topical TID   mouth rinse  15 mL Mouth Rinse 4 times per day   pantoprazole (PROTONIX) IV  40 mg Intravenous Daily   polyethylene glycol  17 g Oral Daily   QUEtiapine  25 mg Per Tube QHS   senna  1 tablet Per Tube Daily   zinc sulfate  220 mg Oral Daily   Continuous Infusions:   ceFAZolin (ANCEF) IV Stopped (06/05/22 0557)   feeding supplement (VITAL 1.5 CAL) 55 mL/hr at 06/05/22 0612   methocarbamol (ROBAXIN) IV       LOS: 42 days      Domenic Polite, MD [facility]  If 7PM-7AM, please contact night-coverage  06/05/2022, 7:45 AM

## 2022-06-05 NOTE — Progress Notes (Signed)
35 Days Post-Op   Subjective/Chief Complaint:  Asking for ear wax drops    Objective: Vital signs in last 24 hours: Temp:  [97.6 F (36.4 C)-98.7 F (37.1 C)] 97.6 F (36.4 C) (11/08 0722) Pulse Rate:  [88-110] 110 (11/08 0722) Resp:  [13-24] 13 (11/08 0722) BP: (105-149)/(46-102) 137/88 (11/08 0722) SpO2:  [97 %-100 %] 97 % (11/08 0722) FiO2 (%):  [28 %] 28 % (11/08 0353) Weight:  [74 kg] 74 kg (11/08 0500) Last BM Date : 06/04/22  Intake/Output from previous day: 11/07 0701 - 11/08 0700 In: 689.5 [NG/GT:489.5; IV Piggyback:200] Out: 1000 [Urine:1000] Intake/Output this shift: No intake/output data recorded.  Trach in place Abd - soft, Nontender  Lab Results:  Recent Labs    06/04/22 0529 06/05/22 0638  WBC 7.6 8.2  HGB 8.7* 8.6*  HCT 29.0* 28.7*  PLT 461* 460*    BMET Recent Labs    06/04/22 0529 06/05/22 0638  NA 143 139  K 4.1 4.1  CL 111 114*  CO2 18* 17*  GLUCOSE 132* 139*  BUN 29* 27*  CREATININE 1.21* 1.10*  CALCIUM 9.6 9.3    PT/INR No results for input(s): "LABPROT", "INR" in the last 72 hours. ABG No results for input(s): "PHART", "HCO3" in the last 72 hours.  Invalid input(s): "PCO2", "PO2"  Studies/Results: No results found.  Anti-infectives: Anti-infectives (From admission, onward)    Start     Dose/Rate Route Frequency Ordered Stop   06/04/22 2100  ceFAZolin (ANCEF) IVPB 2g/100 mL premix        2 g 200 mL/hr over 30 Minutes Intravenous Every 8 hours 06/04/22 1103 06/06/22 0459   06/01/22 1515  ceFEPIme (MAXIPIME) 2 g in sodium chloride 0.9 % 100 mL IVPB  Status:  Discontinued        2 g 200 mL/hr over 30 Minutes Intravenous Every 12 hours 06/01/22 1420 06/04/22 1103   05/15/22 1445  ceFAZolin (ANCEF) IVPB 2g/100 mL premix       Note to Pharmacy: To be given in IR   2 g 200 mL/hr over 30 Minutes Intravenous  Once 05/15/22 1352 05/15/22 1616   05/15/22 1230  ceFAZolin (ANCEF) powder 1 g  Status:  Discontinued        1 g  Other To Surgery 05/15/22 1140 05/15/22 1239   05/15/22 1230  ceFAZolin (ANCEF) IVPB 2g/100 mL premix        2 g 200 mL/hr over 30 Minutes Intravenous To Radiology 05/15/22 1140 05/15/22 1140   05/15/22 0000  ceFAZolin (ANCEF) IVPB 2g/100 mL premix        2 g 200 mL/hr over 30 Minutes Intravenous To Radiology 05/14/22 1308 05/15/22 0005   05/13/22 2134  meropenem (MERREM) 500 mg in sodium chloride 0.9 % 100 mL IVPB        500 mg 200 mL/hr over 30 Minutes Intravenous Every 24 hours 05/13/22 0815 05/17/22 0700   05/13/22 1000  meropenem (MERREM) 500 mg in sodium chloride 0.9 % 100 mL IVPB  Status:  Discontinued        500 mg 200 mL/hr over 30 Minutes Intravenous Every 12 hours 05/12/22 0940 05/12/22 0946   05/12/22 2200  meropenem (MERREM) 500 mg in sodium chloride 0.9 % 100 mL IVPB  Status:  Discontinued        500 mg 200 mL/hr over 30 Minutes Intravenous Every 12 hours 05/12/22 0946 05/13/22 0815   05/12/22 2000  meropenem (MERREM) 1 g in sodium chloride 0.9 %  100 mL IVPB  Status:  Discontinued        1 g 200 mL/hr over 30 Minutes Intravenous  Once 05/12/22 0940 05/12/22 0946   05/06/22 1530  meropenem (MERREM) 1 g in sodium chloride 0.9 % 100 mL IVPB  Status:  Discontinued        1 g 200 mL/hr over 30 Minutes Intravenous Every 8 hours 05/06/22 1438 05/12/22 0940   05/04/22 1000  vancomycin (VANCOCIN) IVPB 1000 mg/200 mL premix  Status:  Discontinued        1,000 mg 200 mL/hr over 60 Minutes Intravenous Every 24 hours 05/03/22 0802 05/08/22 0913   05/03/22 0845  vancomycin (VANCOREADY) IVPB 2000 mg/400 mL        2,000 mg 200 mL/hr over 120 Minutes Intravenous  Once 05/03/22 0751 05/03/22 1114   05/01/22 1800  piperacillin-tazobactam (ZOSYN) IVPB 3.375 g  Status:  Discontinued        3.375 g 12.5 mL/hr over 240 Minutes Intravenous Every 6 hours 05/01/22 1426 05/01/22 1427   05/01/22 1515  piperacillin-tazobactam (ZOSYN) IVPB 3.375 g  Status:  Discontinued        3.375 g 100 mL/hr over  30 Minutes Intravenous Every 6 hours 05/01/22 1428 05/06/22 1438   04/30/22 1400  piperacillin-tazobactam (ZOSYN) IVPB 2.25 g  Status:  Discontinued        2.25 g 100 mL/hr over 30 Minutes Intravenous Every 8 hours 04/30/22 1325 05/01/22 1426   04/29/22 1530  piperacillin-tazobactam (ZOSYN) IVPB 3.375 g  Status:  Discontinued        3.375 g 12.5 mL/hr over 240 Minutes Intravenous Every 8 hours 04/29/22 1433 04/30/22 1325   04/29/22 1145  metroNIDAZOLE (FLAGYL) IVPB 500 mg  Status:  Discontinued        500 mg 100 mL/hr over 60 Minutes Intravenous Every 12 hours 04/29/22 1047 04/29/22 1433   04/29/22 1145  vancomycin (VANCOCIN) IVPB 1000 mg/200 mL premix  Status:  Discontinued        1,000 mg 200 mL/hr over 60 Minutes Intravenous  Once 04/29/22 1047 04/29/22 1055   04/29/22 1145  vancomycin (VANCOREADY) IVPB 1500 mg/300 mL  Status:  Discontinued        1,500 mg 150 mL/hr over 120 Minutes Intravenous Every 24 hours 04/29/22 1055 04/29/22 1357   04/29/22 1045  ceFEPIme (MAXIPIME) 2 g in sodium chloride 0.9 % 100 mL IVPB  Status:  Discontinued        2 g 200 mL/hr over 30 Minutes Intravenous Every 8 hours 04/29/22 0954 04/29/22 1433   04/23/22 0730  ceFAZolin (ANCEF) IVPB 2g/100 mL premix        2 g 200 mL/hr over 30 Minutes Intravenous On call to O.R. 04/23/22 0726 04/23/22 0828       Assessment/Plan: Ms. Moskowitz is s/p robotic incisional hernia repair on 04/23/22 - Tube feeds to goal, medical team adjusting fiber - Adjust pain medication - Okay for diet as deemed fit by speech therapy team - Ear wax drops   Acute metabolic encephalopathy superimposed on hx bipolar disorder, schizophrenia   ICU delirium- improved now off all IV infusions. Acute biventricular HFrEF (Etiology sepsis, stress induced CM. Has clean coronaries) Cardiogenic shock resolved, EF improved.  Acute hypoxic respiratory failure Aspiration PNA Pulm edema   RLL VAP  AKI with oliguria; no baseline CKD - appears to  have some renal recovery, but Cr up some yesterday but good UOP Anemia, acute on chronic  DM2 with hyperglycemia  Severe protein calorie malnutrition  Pressure injury lip - improving  Deconditioning    Appreciate [facility] care with multiple issues   Needs therapy and recouperation     Dispo - Medically stable for discharge.  Difficult placement.   LOS: 42 days    Patricia [name] 06/05/2022

## 2022-06-05 NOTE — Progress Notes (Signed)
Physical Therapy Treatment Patient Details Name: Patricia [name] MRN: [id number] DOB: 16-Aug-1970 Today's Date: 06/05/2022   History of Present Illness 51 yo female admitted 04/23/22 for excisional hernia repair with mesh. Course complicated by post-op ileus, tachycardia, PNA, sepsis. Transfer to ICU 10/2 on levophed. Worsening acidosis 10/3 requiring intubation. Cardiac cath 10/4 with EF 15%. Pt with AKI; on CRRT 10/5-10/14. Failed extubation 10/8 and 10/14; s/p trach 10/14. PMH includes depression, OSA, asthma, schizophrenia, DM, insomnia.    PT Comments    Pt slowly progressing with mobility, improved ability to participate in session when utilizing sara lift. Today's session focused on bed mobility, sitting balance, and sit to stand transfer. Three trials sit to stand maxA+2 with sara lift, pt able to stand 40-60 sec before requesting break due to fatigue. Pt continues to states "I am trying" and pt noticeably frustrated and emotional by current mobility status. Pt remains limited by generalized weakness, decreased activity tolerance, and impaired balance strategies/postural reactions. Continue to recommend acute PT services to maximize functional mobility and independence prior to d/c. Noted AIR declined due to poor activity tolerance, now recommending SNF level therapies.    Recommendations for follow up therapy are one component of a multi-disciplinary discharge planning process, led by the attending physician.  Recommendations may be updated based on patient status, additional functional criteria and insurance authorization.  Follow Up Recommendations  Skilled nursing-short term rehab (<3 hours/day) Can patient physically be transported by private vehicle: No   Assistance Recommended at Discharge Frequent or constant Supervision/Assistance  Patient can return home with the following Two people to help with walking and/or transfers;Two people to help with  bathing/dressing/bathroom;Assistance with cooking/housework;Assistance with feeding;Assist for transportation;Help with stairs or ramp for entrance   Equipment Recommendations  Hospital bed;Wheelchair (measurements PT);Wheelchair cushion (measurements PT);Other (comment) (hoyer lift)    Recommendations for Other Services       Precautions / Restrictions Precautions Precautions: Fall;Other (comment) Precaution Comments: trach collar, PMSV, cortrak Restrictions Weight Bearing Restrictions: No     Mobility  Bed Mobility Overal bed mobility: Needs Assistance Bed Mobility: Supine to Sit     Supine to sit: Mod assist, Max assist, HOB elevated Sit to supine: Mod assist, HOB elevated   General bed mobility comments: supine to sit pt stated needing more help with legs due to pain, demonstrated abillity to get trunk 75% of the way upright with modA for final 25%; modA for LEs to return supine after mobility training    Transfers Overall transfer level: Needs assistance Equipment used: Ambulation equipment used Transfers: Sit to/from Stand Sit to Stand: Max assist, +2 physical assistance           General transfer comment: Pt with preference for [employer] , improved ability to engage LEs as compared to stedy. Max+2 for three trials, +3 to include the lift operator. Stood 40-60 seconds during trials Transfer via Lift Equipment: Agricultural engineer Rankin (Stroke Patients Only)       Balance Overall balance assessment: Needs assistance Sitting-balance support: Feet supported, Single extremity supported, Bilateral upper extremity supported Sitting balance-Leahy Scale: Poor Sitting balance - Comments: pt preference to lean UEs on lift equipment but did demonstrate ability to sit EOB with single UE support, several cues for upright sitting posture  Standing balance support: Bilateral  upper extremity supported, Reliant on assistive device for balance Standing balance-Leahy Scale: Zero Standing balance comment: utilized sara lift, reliant on UE support                            Cognition Arousal/Alertness: Awake/alert Behavior During Therapy: Flat affect Overall Cognitive Status: Impaired/Different from baseline Area of Impairment: Attention, Following commands, Safety/judgement, Problem solving                   Current Attention Level: Sustained   Following Commands: Follows one step commands with increased time Safety/Judgement: Decreased awareness of safety, Decreased awareness of deficits   Problem Solving: Requires tactile cues, Requires verbal cues, Slow processing, Decreased initiation, Difficulty sequencing General Comments: Improved use of PMSV, was able to have brief phone call with family and pt indicated she was working with therapy.        Exercises      General Comments General comments (skin integrity, edema, etc.): VSS during mobility; pt with fluctuating emotion from frustrated to emotional      Pertinent Vitals/Pain Pain Assessment Faces Pain Scale: Hurts whole lot Pain Location: abdomen Pain Descriptors / Indicators: Grimacing, Guarding, Sharp Pain Intervention(s): Monitored during session, Patient requesting pain meds-RN notified    Home Living                          Prior Function            PT Goals (current goals can now be found in the care plan section) Acute Rehab PT Goals Patient Stated Goal: to get better PT Goal Formulation: With patient Time For Goal Achievement: 06/19/22 Potential to Achieve Goals: Fair Progress towards PT goals: Progressing toward goals    Frequency    Min 3X/week      PT Plan Discharge plan needs to be updated    Co-evaluation              AM-PAC PT "6 Clicks" Mobility   Outcome Measure  Help needed turning from your back to your side while in a  flat bed without using bedrails?: A Lot Help needed moving from lying on your back to sitting on the side of a flat bed without using bedrails?: Total Help needed moving to and from a bed to a chair (including a wheelchair)?: Total Help needed standing up from a chair using your arms (e.g., wheelchair or bedside chair)?: Total Help needed to walk in hospital room?: Total Help needed climbing 3-5 steps with a railing? : Total 6 Click Score: 7    End of Session Equipment Utilized During Treatment: Gait belt Activity Tolerance: Patient limited by fatigue Patient left: with call bell/phone within reach;in bed Nurse Communication: Mobility status PT Visit Diagnosis: Muscle weakness (generalized) (M62.81);Difficulty in walking, not elsewhere classified (R26.2);Other abnormalities of gait and mobility (R26.89);Unsteadiness on feet (R26.81)     Time: 8657-8469 PT Time Calculation (min) (ACUTE ONLY): 34 min  Charges:  $Therapeutic Activity: 23-37 mins                     Chipper Oman, SPT    [place] [facility] [place] 06/05/2022, 1:20 PM

## 2022-06-06 DIAGNOSIS — A419 Sepsis, unspecified organism: Secondary | ICD-10-CM | POA: Diagnosis not present

## 2022-06-06 DIAGNOSIS — R6521 Severe sepsis with septic shock: Secondary | ICD-10-CM | POA: Diagnosis not present

## 2022-06-06 LAB — GLUCOSE, CAPILLARY
Glucose-Capillary: 118 mg/dL — ABNORMAL HIGH (ref 70–99)
Glucose-Capillary: 123 mg/dL — ABNORMAL HIGH (ref 70–99)
Glucose-Capillary: 142 mg/dL — ABNORMAL HIGH (ref 70–99)
Glucose-Capillary: 146 mg/dL — ABNORMAL HIGH (ref 70–99)

## 2022-06-06 MED ORDER — FAMOTIDINE 20 MG PO TABS
20.0000 mg | ORAL_TABLET | Freq: Two times a day (BID) | ORAL | Status: DC
  Administered 2022-06-06 – 2022-06-13 (×15): 20 mg
  Filled 2022-06-06 (×15): qty 1

## 2022-06-06 MED ORDER — CARBAMIDE PEROXIDE 6.5 % OT SOLN
5.0000 [drp] | Freq: Two times a day (BID) | OTIC | Status: AC
  Administered 2022-06-06 – 2022-06-07 (×2): 5 [drp] via OTIC
  Filled 2022-06-06: qty 15

## 2022-06-06 MED ORDER — OMEPRAZOLE 2 MG/ML ORAL SUSPENSION: 40.0000 mg | Freq: Every day | ORAL | Status: DC

## 2022-06-06 NOTE — Progress Notes (Signed)
Patient did well today, she had a small bout of emesis this morning but respiratory was present in room when this occurred and she stated no emesis went down trache. Let surgery and Internal Med know just as an fyi. Scheduled meds and pain medication mainly for today, most of her pain is in her abdomen she states.

## 2022-06-06 NOTE — Progress Notes (Signed)
PROGRESS NOTE    Patricia [name]  HYI:[id number] DOB: 05/19/1971 DOA: 04/23/2022 PCP: [name], MD   Brief Narrative:   51 year old with history of DM2, depression, migraine, asthma, schizophrenia, HTN, HLD presented to the hospital for exertional hernia, robotic hernia repair on 04/23/2022 with mesh who developed postop ileus and is on TNA.  Also patient developed vomiting and hypotension after NG tube came out on 04/29/2022 with concerns of pneumonia which was treated with antibiotics and IV fluids.  During that time patient also required Levophed and eventually intubated on 10/3 for worsening metabolic acidosis complicated by acute kidney injury.  Echocardiogram showed depressed EF of 30-35% with elevated troponin.  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  Patient was seen by CHF team started on dopamine and CRRT as well.  Patient was again reintubated due to neuromuscular weakness on 10/8.  Eventually extubated on 10/14 to BiPAP and trach was placed due to weakness.  Tube feeds held due to nausea vomiting on 10/30.  Hospital course complicated by hypernatremia, she developed AKI, requiring brief course of HD, renal function has recovered.    Subjective:  An episode of vomiting earlier this morning, feels okay now, tolerating tube feeds, breathing is fair  Assessment and Plan:  Septic shock ([place]) secondary to aspiration pneumonia, right lower lobe Acute respiratory failure with prolonged mechanical ventilation status post tracheostomy -Trach management by pulmonary team. - Bronchodilators as needed, supportive care.  Out of bed to chair -toelrating PMV -Followed by another aspiration event, started empirically on cefepime, sputum culture growing Klebsiella, then ancef, completed Abx 11/8 -Patient failed MBS on Friday, remains n.p.o., only ice chips, feeding via cortrak only SLP following for weekly swallow eval's, if fails again may need to consider PEG  tube placement -diarrhea on tube feeds, continue fiber  Status post hernia repair complicated by postop ileus/inflammatory enteritis Intermittent nausea vomiting -Management by general surgery.  She is working with speech and swallow.  Remains n.p.o., PMV.  Tube feeds, SLP following for dysphagia progression -Management per primary surgical team, repeat CT abdomen with no acute abdominal findings.  Hypernatremia - resolved, if sodium started to increase then will start on D5W , as surgery would like to avoid excessive free water via cortrak, as it usually causes nausea, vomiting and abdominal pain .  AKI -Likely due to ATN in the setting of cardiogenic/septic shock, required CRRT 10/410/14, first HD on 10/16, renal function has recovered.  Right knee pain - Unclear etiology, no evidence of trauma.  Negative x-ray  Acute kidney injury, improving -Initially required CRRT and HD.  Now improving without dialysis  Biventricular failure, reduced EF - Last seen by CHF team 10/16.  Previously tried Bumex with minimal to no urine output.  At this point patient is on dialysis and has been well compensated.  Plans to follow-up outpatient. LHC shows clean coronaries.   Sinus tachycardia; improved.  - Now on metoprolol 50 mg twice daily.  IV as needed  Anemia of chronic disease - From underlying multiple medical issues.  Transfuse as necessary and attempt to keep hemoglobin greater than 8  Essential hypertension -Currently on metoprolol twice daily -We will add as needed hydralazine  Chronic low back pain -As needed pain control  Morbid obesity (HCC) -Body mass index is 41.37 kg/m..  Currently on tube feeds -Follow-up outpatient, weight loss diet and exercise  BIPOLAR DISORDER UNSPECIFIED -On Klonopin.  Should slowly resume home meds as appropriate  Diabetes mellitus type 2  in obese (HCC) -Sliding scale and Accu-Cheks. -stable  Pressure area on the lip in the setting of ET tube  placement which was not present prior to admission.  Routine wound care.  PT-CIR   DVT prophylaxis: Subcu heparin Code Status: Full code Family Communication:  none at bedside  Final dispo per general surgery  Nutritional status    Signs/Symptoms: NPO status (with need for MBS per SLP)  Interventions: Refer to RD note for recommendations, Tube feeding, MVI  Body mass index is 28 kg/m.      Examination:  Gen: Pleasant female sitting up in bed, appears older than stated age, AAO x3 HEENT: Cortrak and trach collar noted CVS: S1-S2, regular rhythm Lungs: Conducted upper airway sounds Abdomen: Soft, nontender, bowel sounds present Extremities: No edema Skin: no new rashes on exposed skin    Objective: Vitals:   06/06/22 0800 06/06/22 0805 06/06/22 0850 06/06/22 1121  BP:   (!) 128/94   Pulse:  (!) 106 (!) 105   Resp:  (!) '22 20 20  '$ Temp:   98.6 F (37 C)   TempSrc:   Oral   SpO2: 96% 96% 94% 99%  Weight:      Height:        Intake/Output Summary (Last 24 hours) at 06/06/2022 1210 Last data filed at 06/06/2022 0851 Gross per 24 hour  Intake 724.75 ml  Output 1100 ml  Net -375.25 ml    Filed Weights   06/03/22 0500 06/04/22 0500 06/05/22 0500  Weight: 80.4 kg 78.7 kg 74 kg     Data Reviewed:   CBC: Recent Labs  Lab 05/31/22 0323 06/02/22 1808 06/03/22 0410 06/04/22 0529 06/05/22 0638  WBC 8.3 8.7 7.7 7.6 8.2  HGB 7.9* 7.9* 7.8* 8.7* 8.6*  HCT 25.9* 25.7* 26.2* 29.0* 28.7*  MCV 91.5 90.2 91.6 91.8 90.8  PLT 518* 435* 448* 461* 621*   Basic Metabolic Panel: Recent Labs  Lab 05/31/22 0323 06/01/22 0220 06/02/22 1040 06/03/22 0410 06/04/22 0529 06/05/22 0638  NA 143 147* 144 141 143 139  K 3.4* 4.2 3.8 3.6 4.1 4.1  CL 112* 117* 117* 114* 111 114*  CO2 23 20* 19* 20* 18* 17*  GLUCOSE 122* 107* 152* 125* 132* 139*  BUN 43* 43* 34* 28* 29* 27*  CREATININE 1.61* 2.03* 1.54* 1.34* 1.21* 1.10*  CALCIUM 8.6* 8.7* 8.8* 8.9 9.6 9.3  MG 1.7 1.8  1.8 1.8 1.9  --   PHOS 4.4 4.4 3.8 3.9 4.1 4.6   GFR: Estimated Creatinine Clearance: 58.9 mL/min (A) (by C-G formula based on SCr of 1.1 mg/dL (H)). Liver Function Tests: Recent Labs  Lab 06/01/22 0220 06/02/22 1040 06/03/22 0410 06/04/22 0529 06/05/22 3086  ALBUMIN 2.3* 2.1* 2.1* 2.3* 2.4*   No results for input(s): "LIPASE", "AMYLASE" in the last 168 hours. No results for input(s): "AMMONIA" in the last 168 hours. Coagulation Profile: No results for input(s): "INR", "PROTIME" in the last 168 hours. Cardiac Enzymes: No results for input(s): "CKTOTAL", "CKMB", "CKMBINDEX", "TROPONINI" in the last 168 hours. BNP (last 3 results) No results for input(s): "PROBNP" in the last 8760 hours. HbA1C: No results for input(s): "HGBA1C" in the last 72 hours. CBG: Recent Labs  Lab 06/05/22 1207 06/05/22 1753 06/06/22 0049 06/06/22 0631 06/06/22 1203  GLUCAP 175* 116* 123* 142* 146*   Lipid Profile: No results for input(s): "CHOL", "HDL", "LDLCALC", "TRIG", "CHOLHDL", "LDLDIRECT" in the last 72 hours. Thyroid Function Tests: No results for input(s): "TSH", "T4TOTAL", "FREET4", "T3FREE", "THYROIDAB"  in the last 72 hours. Anemia Panel: No results for input(s): "VITAMINB12", "FOLATE", "FERRITIN", "TIBC", "IRON", "RETICCTPCT" in the last 72 hours. Sepsis Labs: No results for input(s): "PROCALCITON", "LATICACIDVEN" in the last 168 hours.  Recent Results (from the past 240 hour(s))  Culture, Respiratory w Gram Stain     Status: None   Collection Time: 06/01/22  8:39 AM   Specimen: Tracheal Aspirate; Respiratory  Result Value Ref Range Status   Specimen Description TRACHEAL ASPIRATE  Final   Special Requests NONE  Final   Gram Stain   Final    ABUNDANT WBC PRESENT, PREDOMINANTLY PMN MODERATE GRAM POSITIVE COCCI IN CHAINS IN PAIRS    Culture   Final    RARE KLEBSIELLA PNEUMONIAE WITHIN NORMAL RESPIRATORY FLORA Performed at [facility], [place] [address] [place], [place]  [address]    Report Status 06/03/2022 FINAL  Final   Organism ID, Bacteria KLEBSIELLA PNEUMONIAE  Final      Susceptibility   Klebsiella pneumoniae - MIC*    AMPICILLIN >=32 RESISTANT Resistant     CEFAZOLIN <=4 SENSITIVE Sensitive     CEFEPIME <=0.12 SENSITIVE Sensitive     CEFTAZIDIME <=1 SENSITIVE Sensitive     CEFTRIAXONE <=0.25 SENSITIVE Sensitive     CIPROFLOXACIN <=0.25 SENSITIVE Sensitive     GENTAMICIN <=1 SENSITIVE Sensitive     IMIPENEM <=0.25 SENSITIVE Sensitive     TRIMETH/SULFA >=320 RESISTANT Resistant     AMPICILLIN/SULBACTAM 8 SENSITIVE Sensitive     PIP/TAZO <=4 SENSITIVE Sensitive     * RARE KLEBSIELLA PNEUMONIAE     Scheduled Meds:  acetaminophen (TYLENOL) oral liquid 160 mg/5 mL  650 mg Per Tube Q4H   ascorbic acid  250 mg Oral Daily   carbamide peroxide  5 drop Both EARS BID   copper  2 mg Per Tube Daily   feeding supplement (PROSource TF20)  60 mL Per Tube Daily   fiber  1 packet Per Tube TID   gabapentin  200 mg Per Tube Q8H   heparin injection (subcutaneous)  5,000 Units Subcutaneous Q8H   insulin aspart  0-20 Units Subcutaneous Q6H   insulin aspart  4 Units Subcutaneous Q6H   metoprolol tartrate  50 mg Per Tube BID   multivitamin with minerals  1 tablet Per Tube Daily   nystatin   Topical TID   mouth rinse  15 mL Mouth Rinse 4 times per day   pantoprazole (PROTONIX) IV  40 mg Intravenous Daily   polyethylene glycol  17 g Oral Daily   QUEtiapine  25 mg Per Tube QHS   senna  1 tablet Per Tube Daily   zinc sulfate  220 mg Oral Daily   Continuous Infusions:  feeding supplement (VITAL 1.5 CAL) 1,000 mL (06/05/22 1858)   methocarbamol (ROBAXIN) IV       LOS: 43 days      Domenic Polite, MD [facility]  If 7PM-7AM, please contact night-coverage  06/06/2022, 12:10 PM

## 2022-06-06 NOTE — Plan of Care (Signed)
  Problem: Clinical Measurements: Goal: Will remain free from infection Outcome: Progressing Goal: Diagnostic test results will improve Outcome: Progressing Goal: Respiratory complications will improve Outcome: Progressing Goal: Cardiovascular complication will be avoided Outcome: Progressing   Problem: Activity: Goal: Risk for activity intolerance will decrease Outcome: Progressing   Problem: Nutrition: Goal: Adequate nutrition will be maintained Outcome: Progressing   Problem: Coping: Goal: Level of anxiety will decrease Outcome: Progressing   Problem: Elimination: Goal: Will not experience complications related to bowel motility Outcome: Progressing Goal: Will not experience complications related to urinary retention Outcome: Progressing   Problem: Pain Managment: Goal: General experience of comfort will improve Outcome: Progressing   Problem: Safety: Goal: Ability to remain free from injury will improve Outcome: Progressing   Problem: Skin Integrity: Goal: Risk for impaired skin integrity will decrease Outcome: Progressing   Problem: Fluid Volume: Goal: Hemodynamic stability will improve Outcome: Progressing   Problem: Clinical Measurements: Goal: Diagnostic test results will improve Outcome: Progressing Goal: Signs and symptoms of infection will decrease Outcome: Progressing   Problem: Respiratory: Goal: Ability to maintain adequate ventilation will improve Outcome: Progressing   Problem: Education: Goal: Ability to describe self-care measures that may prevent or decrease complications (Diabetes Survival Skills Education) will improve Outcome: Progressing Goal: Individualized Educational Video(s) Outcome: Progressing   Problem: Coping: Goal: Ability to adjust to condition or change in health will improve Outcome: Progressing   Problem: Fluid Volume: Goal: Ability to maintain a balanced intake and output will improve Outcome: Progressing    Problem: Health Behavior/Discharge Planning: Goal: Ability to identify and utilize available resources and services will improve Outcome: Progressing Goal: Ability to manage health-related needs will improve Outcome: Progressing   Problem: Metabolic: Goal: Ability to maintain appropriate glucose levels will improve Outcome: Progressing   Problem: Nutritional: Goal: Maintenance of adequate nutrition will improve Outcome: Progressing Goal: Progress toward achieving an optimal weight will improve Outcome: Progressing   Problem: Skin Integrity: Goal: Risk for impaired skin integrity will decrease Outcome: Progressing   Problem: Tissue Perfusion: Goal: Adequacy of tissue perfusion will improve Outcome: Progressing   Problem: Activity: Goal: Ability to tolerate increased activity will improve Outcome: Progressing   Problem: Respiratory: Goal: Ability to maintain a clear airway and adequate ventilation will improve Outcome: Progressing   Problem: Role Relationship: Goal: Method of communication will improve Outcome: Progressing   Problem: Education: Goal: Understanding of CV disease, CV risk reduction, and recovery process will improve Outcome: Progressing Goal: Individualized Educational Video(s) Outcome: Progressing   Problem: Activity: Goal: Ability to return to baseline activity level will improve Outcome: Progressing   Problem: Cardiovascular: Goal: Ability to achieve and maintain adequate cardiovascular perfusion will improve Outcome: Progressing Goal: Vascular access site(s) Level 0-1 will be maintained Outcome: Progressing   Problem: Health Behavior/Discharge Planning: Goal: Ability to safely manage health-related needs after discharge will improve Outcome: Progressing   Problem: Education: Goal: 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 Outcome: Progressing   Problem: Activity: Goal: Ability to tolerate increased activity will  improve Outcome: Progressing   Problem: Health Behavior/Discharge Planning: Goal: Ability to manage 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 Outcome: Progressing   Problem: Respiratory: Goal: Patent airway maintenance will improve Outcome: Progressing

## 2022-06-06 NOTE — Progress Notes (Signed)
Patient vomited shortly after suctioning.  PMV was in place sat stayed consistent at 96%.  Changed Inner cannula, new dressing.  RN and I cleaned her up and pt stable.

## 2022-06-06 NOTE — Progress Notes (Signed)
Occupational Therapy Treatment Patient Details Name: Patricia [name] MRN: [id number] DOB: March 20, 1971 Today's Date: 06/06/2022   History of present illness 51 yo female admitted 04/23/22 for excisional hernia repair with mesh. Course complicated by post-op ileus, tachycardia, PNA, sepsis. Transfer to ICU 10/2 on levophed. Worsening acidosis 10/3 requiring intubation. Cardiac cath 10/4 with EF 15%. Pt with AKI; on CRRT 10/5-10/14. Failed extubation 10/8 and 10/14; s/p trach 10/14. PMH includes depression, OSA, asthma, schizophrenia, DM, insomnia.   OT comments  This 51 yo female admitted with above presents to acute OT with making progress with bed mobility, sitting EOB balance and bathing/grooming sitting EOB. She will continue to benefit from acute OT with follow up now changed to SNF.   Recommendations for follow up therapy are one component of a multi-disciplinary discharge planning process, led by the attending physician.  Recommendations may be updated based on patient status, additional functional criteria and insurance authorization.    Follow Up Recommendations  Skilled nursing-short term rehab (<3 hours/day)    Assistance Recommended at Discharge Frequent or constant Supervision/Assistance  Patient can return home with the following  Two people to help with walking and/or transfers;A lot of help with bathing/dressing/bathroom;Assistance with cooking/housework;Help with stairs or ramp for entrance;Assist for transportation   Equipment Recommendations  Other (comment) (TBD next venue)       Precautions / Restrictions Precautions Precautions: Fall Precaution Comments: trach collar, PMSV (to worn all waking hours with cuff deflated, cortrak Restrictions Weight Bearing Restrictions: No       Mobility Bed Mobility Overal bed mobility: Needs Assistance Bed Mobility: Rolling, Sidelying to Sit, Sit to Sidelying Rolling: Mod assist Sidelying to sit: Max assist, HOB  elevated Supine to sit: Mod assist   Sit to sidelying: Independent General bed mobility comments: Needs A to bend knees up before rolling (RLE weaker than LLE), use bed rail to help roll; Max A for legs off bed and and min A for up with trunk; A for legs back into bed with VCs for sequencing       Balance Overall balance assessment: Needs assistance Sitting-balance support: No upper extremity supported, Feet supported Sitting balance-Leahy Scale: Good Sitting balance - Comments: Able sit EOB and wash UB without LOB; occassionally having to supports self with one UE at a time                                   ADL either performed or assessed with clinical judgement   ADL Overall ADL's : Needs assistance/impaired Eating/Feeding: NPO   Grooming: Min guard;Sitting;Wash/dry face;Wash/dry hands;Applying deodorant Grooming Details (indicate cue type and reason): EOB Upper Body Bathing: Sitting;Minimal assistance Upper Body Bathing Details (indicate cue type and reason): needed some A with bathing to get under folds Lower Body Bathing: Maximal assistance Lower Body Bathing Details (indicate cue type and reason): sitting EOB, can wash her legs from hip to mid way down front of leg                            Extremity/Trunk Assessment Upper Extremity Assessment Upper Extremity Assessment: Generalized weakness RUE Deficits / Details: Can use arms to help pull herself up in bed with bed in [place] and used them to wash her UB and front of legs while seated EOB LUE Deficits / Details: Can use arms to help pull herself up in bed with bed  in [place] and used them to wash her UB and front of legs while seated EOB            Vision Baseline Vision/History: 0 No visual deficits Patient Visual Report: No change from baseline            Cognition Arousal/Alertness: Awake/alert Behavior During Therapy: WFL for tasks assessed/performed Overall Cognitive  Status: Within Functional Limits for tasks assessed                                                     Pertinent Vitals/ Pain       Pain Assessment Pain Assessment: No/denies pain         Frequency  Min 2X/week        Progress Toward Goals  OT Goals(current goals can now be found in the care plan section)  Progress towards OT goals: Progressing toward goals  Acute Rehab OT Goals Patient Stated Goal: to be able to stand and walk OT Goal Formulation: With patient Time For Goal Achievement: 06/24/22 Potential to Achieve Goals: Good  Plan Discharge plan needs to be updated       AM-PAC OT "6 Clicks" Daily Activity     Outcome Measure   Help from another person eating meals?: Total Help from another person taking care of personal grooming?: A Little Help from another person toileting, which includes using toliet, bedpan, or urinal?: Total Help from another person bathing (including washing, rinsing, drying)?: A Lot Help from another person to put on and taking off regular upper body clothing?: A Little Help from another person to put on and taking off regular lower body clothing?: Total 6 Click Score: 11    End of Session Equipment Utilized During Treatment: Oxygen (trach collar)  OT Visit Diagnosis: Other abnormalities of gait and mobility (R26.89);Muscle weakness (generalized) (M62.81)   Activity Tolerance Patient tolerated treatment well   Patient Left in bed;with call bell/phone within reach;with bed alarm set   Nurse Communication  (called front desk to let them know if did not appear that purewick was suctioning correctly--no one ever came to check ( Ichanged all of bed linen ))        Time: 1353-1445 OT Time Calculation (min): 52 min  Charges: OT General Charges $OT Visit: 1 Visit OT Treatments $Self Care/Home Management : 38-52 mins  [place], OTR/L Acute Rehab Services [facility] [phone] Office  [phone]    Almon Register 06/06/2022, 2:57 PM

## 2022-06-06 NOTE — Progress Notes (Signed)
Nutrition Follow-up  DOCUMENTATION CODES:   Not applicable  INTERVENTION:  Tube Feeds via Cortrak: Peptamen 1.5 at 55 mL/hr ProSource TF20 - daily Provides 2060 kcal, 110 gm protein, and 1014 mL free water daily. Continue Multivitamin w/ minerals daily Continue Zinc Sulfate 220 mg and Copper 2 mg daily to correct deficiencies  NUTRITION DIAGNOSIS:   Swallowing difficulty related to dysphagia as evidenced by NPO status (with need for MBS per SLP). - Ongoing, being addressed via TF  GOAL:   Patient will meet greater than or equal to 90% of their needs - Being met via TF  MONITOR:   TF tolerance, Weight trends, Labs, Diet advancement, I & O's  REASON FOR ASSESSMENT:   Consult Enteral/tube feeding initiation and management (trickle tube feeds)  ASSESSMENT:   Pt with hx of bipolar disorder, HTN, HLD, GERD, chronic bronchitis, GERD, DM type 2, and multiple prior abdominal surgeries presented to hospital for planned repair of a spigelian hernia.   09/26 - s/p robotic incisional hernia repair with mesh, extensive LOA 10/02 - transferred to ICU, pressors started, NG tube placed for decompression, TPN start 10/03 - intubated, NG tube exchanged for OG tube 10/04 - CRRT start, OG tube removed and replaced, concern for STEMI s/p L heart cath and coronary angiography without occlusion 10/08 - extubated, later re-intubated 10/09 - trickle TF initiated 10/12 - TF increased to goal of 50 10/13 - Cortrak placed 10/14 - CRRT discontinued, Extubated, Re-Intubated, Trach placed 10/15 - TPN discontinued 10/16 - First iHD 10/28 - Pt continues to work with SLP (rec NPO), remains on TFs at goal 10/30 - N/V, TFs held, remains on Aerosal trach collar 11/01 - Pt refusing TFs d/t abdominal pain; recovered renal function: TDC removed  11/03 - MBS, recommending NPO  11/08 - transferred to 6N  Pt laying in bed, reports that she is having some nausea and vomited this morning. Discussed waiting  for repeat MBS before making any more changes. Per notes, pt vomited after being suctioned.   Medications reviewed and include: Vitamin C, Copper, Nutrisource Fiber, NovoLog SSI + 4 units, MVI, Miralax, Senokot, Zinc Sulfate Labs reviewed: 24 hr CBGs 116-146  Micronutrient Labs CRP: 1.9 (H) Ceruloplasmin: 10.8 (L) Copper: 40 (L) Vitamin B12: 1079 Vitamin B6: 6.4 Vitamin C: 0.5 (wdl) Zinc: 34 (L)  Diet Order:   Diet Order             Diet NPO time specified Except for: Ice Chips  Diet effective midnight                   EDUCATION NEEDS:   No education needs have been identified at this time  Skin:  Skin Assessment: Reviewed RN Assessment Skin Integrity Issues:: Stage I DTI: upper lip from ET tube Stage I: heel  Last BM:  11/9 - Type 7  Height:   Ht Readings from Last 1 Encounters:  05/22/22 _0  (1.626 m)    Weight:   Wt Readings from Last 1 Encounters:  06/05/22 74 kg    Ideal Body Weight:  54.5 kg  BMI:  Body mass index is 28 kg/m.  Estimated Nutritional Needs:  Kcal:  1900-2200 kcal Protein:  80-110 g Fluid:  1900-2000 mL    Hermina Barters RD, LDN Clinical Dietitian See [facility] for contact information.

## 2022-06-06 NOTE — Plan of Care (Signed)
  Problem: Education: Goal: Knowledge of General Education information will improve Description: Including pain rating scale, medication(s)/side effects and non-pharmacologic comfort measures Outcome: Progressing   Problem: Health Behavior/Discharge Planning: Goal: Ability to manage health-related needs will improve Outcome: Progressing   Problem: Clinical Measurements: Goal: Ability to maintain clinical measurements within normal limits will improve Outcome: Progressing Goal: Will remain free from infection Outcome: Progressing Goal: Diagnostic test results will improve Outcome: Progressing Goal: Respiratory complications will improve Outcome: Progressing Goal: Cardiovascular complication will be avoided Outcome: Progressing   Problem: Activity: Goal: Risk for activity intolerance will decrease Outcome: Progressing   Problem: Nutrition: Goal: Adequate nutrition will be maintained Outcome: Progressing   Problem: Coping: Goal: Level of anxiety will decrease Outcome: Progressing   Problem: Elimination: Goal: Will not experience complications related to bowel motility Outcome: Progressing Goal: Will not experience complications related to urinary retention Outcome: Progressing   Problem: Pain Managment: Goal: General experience of comfort will improve Outcome: Progressing   Problem: Safety: Goal: Ability to remain free from injury will improve Outcome: Progressing   Problem: Skin Integrity: Goal: Risk for impaired skin integrity will decrease Outcome: Progressing   Problem: Fluid Volume: Goal: Hemodynamic stability will improve Outcome: Progressing   Problem: Clinical Measurements: Goal: Diagnostic test results will improve Outcome: Progressing Goal: Signs and symptoms of infection will decrease Outcome: Progressing   Problem: Respiratory: Goal: Ability to maintain adequate ventilation will improve Outcome: Progressing   Problem: Education: Goal: Ability  to describe self-care measures that may prevent or decrease complications (Diabetes Survival Skills Education) will improve Outcome: Progressing Goal: Individualized Educational Video(s) Outcome: Progressing   Problem: Coping: Goal: Ability to adjust to condition or change in health will improve Outcome: Progressing   Problem: Fluid Volume: Goal: Ability to maintain a balanced intake and output will improve Outcome: Progressing   Problem: Health Behavior/Discharge Planning: Goal: Ability to identify and utilize available resources and services will improve Outcome: Progressing Goal: Ability to manage health-related needs will improve Outcome: Progressing   Problem: Metabolic: Goal: Ability to maintain appropriate glucose levels will improve Outcome: Progressing   Problem: Nutritional: Goal: Maintenance of adequate nutrition will improve Outcome: Progressing Goal: Progress toward achieving an optimal weight will improve Outcome: Progressing   Problem: Skin Integrity: Goal: Risk for impaired skin integrity will decrease Outcome: Progressing   Problem: Tissue Perfusion: Goal: Adequacy of tissue perfusion will improve Outcome: Progressing   Problem: Activity: Goal: Ability to tolerate increased activity will improve Outcome: Progressing   Problem: Respiratory: Goal: Ability to maintain a clear airway and adequate ventilation will improve Outcome: Progressing   Problem: Role Relationship: Goal: Method of communication will improve Outcome: Progressing   Problem: Education: Goal: Understanding of CV disease, CV risk reduction, and recovery process will improve Outcome: Progressing Goal: Individualized Educational Video(s) Outcome: Progressing   Problem: Activity: Goal: Ability to return to baseline activity level will improve Outcome: Progressing   Problem: Cardiovascular: Goal: Ability to achieve and maintain adequate cardiovascular perfusion will  improve Outcome: Progressing Goal: Vascular access site(s) Level 0-1 will be maintained Outcome: Progressing   Problem: Health Behavior/Discharge Planning: Goal: Ability to safely manage health-related needs after discharge will improve Outcome: Progressing   Problem: Education: Goal: 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 Outcome: Progressing   Problem: Activity: Goal: Ability to tolerate increased activity will improve Outcome: Progressing   Problem: Health Behavior/Discharge Planning: Goal: Ability to manage 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 Outcome: Progressing   Problem: Respiratory: Goal: Patent airway maintenance will improve Outcome: Progressing

## 2022-06-06 NOTE — Progress Notes (Signed)
36 Days Post-Op   Subjective/Chief Complaint:  Ears feel better Moved to 6N Some abdominal pain    Objective: Vital signs in last 24 hours: Temp:  [98 F (36.7 C)-99.1 F (37.3 C)] 98.4 F (36.9 C) (11/09 0626) Pulse Rate:  [90-108] 102 (11/09 0626) Resp:  [14-30] 24 (11/09 0626) BP: (109-137)/(76-102) 127/96 (11/09 0626) SpO2:  [97 %-100 %] 100 % (11/09 0626) FiO2 (%):  [28 %] 28 % (11/08 2330) Last BM Date : 06/06/22  Intake/Output from previous day: 11/08 0701 - 11/09 0700 In: 819.7 [NG/GT:719.7; IV Piggyback:100] Out: 300 [Urine:300] Intake/Output this shift: No intake/output data recorded.  Trach in place Abd - soft, Nontender  Lab Results:  Recent Labs    06/04/22 0529 06/05/22 0638  WBC 7.6 8.2  HGB 8.7* 8.6*  HCT 29.0* 28.7*  PLT 461* 460*    BMET Recent Labs    06/04/22 0529 06/05/22 0638  NA 143 139  K 4.1 4.1  CL 111 114*  CO2 18* 17*  GLUCOSE 132* 139*  BUN 29* 27*  CREATININE 1.21* 1.10*  CALCIUM 9.6 9.3    PT/INR No results for input(s): "LABPROT", "INR" in the last 72 hours. ABG No results for input(s): "PHART", "HCO3" in the last 72 hours.  Invalid input(s): "PCO2", "PO2"  Studies/Results: No results found.  Anti-infectives: Anti-infectives (From admission, onward)    Start     Dose/Rate Route Frequency Ordered Stop   06/04/22 2100  ceFAZolin (ANCEF) IVPB 2g/100 mL premix        2 g 200 mL/hr over 30 Minutes Intravenous Every 8 hours 06/04/22 1103 06/05/22 2037   06/01/22 1515  ceFEPIme (MAXIPIME) 2 g in sodium chloride 0.9 % 100 mL IVPB  Status:  Discontinued        2 g 200 mL/hr over 30 Minutes Intravenous Every 12 hours 06/01/22 1420 06/04/22 1103   05/15/22 1445  ceFAZolin (ANCEF) IVPB 2g/100 mL premix       Note to Pharmacy: To be given in IR   2 g 200 mL/hr over 30 Minutes Intravenous  Once 05/15/22 1352 05/15/22 1616   05/15/22 1230  ceFAZolin (ANCEF) powder 1 g  Status:  Discontinued        1 g Other To  Surgery 05/15/22 1140 05/15/22 1239   05/15/22 1230  ceFAZolin (ANCEF) IVPB 2g/100 mL premix        2 g 200 mL/hr over 30 Minutes Intravenous To Radiology 05/15/22 1140 05/15/22 1140   05/15/22 0000  ceFAZolin (ANCEF) IVPB 2g/100 mL premix        2 g 200 mL/hr over 30 Minutes Intravenous To Radiology 05/14/22 1308 05/15/22 0005   05/13/22 2134  meropenem (MERREM) 500 mg in sodium chloride 0.9 % 100 mL IVPB        500 mg 200 mL/hr over 30 Minutes Intravenous Every 24 hours 05/13/22 0815 05/17/22 0700   05/13/22 1000  meropenem (MERREM) 500 mg in sodium chloride 0.9 % 100 mL IVPB  Status:  Discontinued        500 mg 200 mL/hr over 30 Minutes Intravenous Every 12 hours 05/12/22 0940 05/12/22 0946   05/12/22 2200  meropenem (MERREM) 500 mg in sodium chloride 0.9 % 100 mL IVPB  Status:  Discontinued        500 mg 200 mL/hr over 30 Minutes Intravenous Every 12 hours 05/12/22 0946 05/13/22 0815   05/12/22 2000  meropenem (MERREM) 1 g in sodium chloride 0.9 % 100 mL IVPB  Status:  Discontinued        1 g 200 mL/hr over 30 Minutes Intravenous  Once 05/12/22 0940 05/12/22 0946   05/06/22 1530  meropenem (MERREM) 1 g in sodium chloride 0.9 % 100 mL IVPB  Status:  Discontinued        1 g 200 mL/hr over 30 Minutes Intravenous Every 8 hours 05/06/22 1438 05/12/22 0940   05/04/22 1000  vancomycin (VANCOCIN) IVPB 1000 mg/200 mL premix  Status:  Discontinued        1,000 mg 200 mL/hr over 60 Minutes Intravenous Every 24 hours 05/03/22 0802 05/08/22 0913   05/03/22 0845  vancomycin (VANCOREADY) IVPB 2000 mg/400 mL        2,000 mg 200 mL/hr over 120 Minutes Intravenous  Once 05/03/22 0751 05/03/22 1114   05/01/22 1800  piperacillin-tazobactam (ZOSYN) IVPB 3.375 g  Status:  Discontinued        3.375 g 12.5 mL/hr over 240 Minutes Intravenous Every 6 hours 05/01/22 1426 05/01/22 1427   05/01/22 1515  piperacillin-tazobactam (ZOSYN) IVPB 3.375 g  Status:  Discontinued        3.375 g 100 mL/hr over 30  Minutes Intravenous Every 6 hours 05/01/22 1428 05/06/22 1438   04/30/22 1400  piperacillin-tazobactam (ZOSYN) IVPB 2.25 g  Status:  Discontinued        2.25 g 100 mL/hr over 30 Minutes Intravenous Every 8 hours 04/30/22 1325 05/01/22 1426   04/29/22 1530  piperacillin-tazobactam (ZOSYN) IVPB 3.375 g  Status:  Discontinued        3.375 g 12.5 mL/hr over 240 Minutes Intravenous Every 8 hours 04/29/22 1433 04/30/22 1325   04/29/22 1145  metroNIDAZOLE (FLAGYL) IVPB 500 mg  Status:  Discontinued        500 mg 100 mL/hr over 60 Minutes Intravenous Every 12 hours 04/29/22 1047 04/29/22 1433   04/29/22 1145  vancomycin (VANCOCIN) IVPB 1000 mg/200 mL premix  Status:  Discontinued        1,000 mg 200 mL/hr over 60 Minutes Intravenous  Once 04/29/22 1047 04/29/22 1055   04/29/22 1145  vancomycin (VANCOREADY) IVPB 1500 mg/300 mL  Status:  Discontinued        1,500 mg 150 mL/hr over 120 Minutes Intravenous Every 24 hours 04/29/22 1055 04/29/22 1357   04/29/22 1045  ceFEPIme (MAXIPIME) 2 g in sodium chloride 0.9 % 100 mL IVPB  Status:  Discontinued        2 g 200 mL/hr over 30 Minutes Intravenous Every 8 hours 04/29/22 0954 04/29/22 1433   04/23/22 0730  ceFAZolin (ANCEF) IVPB 2g/100 mL premix        2 g 200 mL/hr over 30 Minutes Intravenous On call to O.R. 04/23/22 0726 04/23/22 0828       Assessment/Plan: Patricia [name] is s/p robotic incisional hernia repair on 04/23/22 - Tube feeds to goal, medical team adjusting fiber - Adjust pain medication - Okay for diet as deemed fit by speech therapy team   Acute metabolic encephalopathy superimposed on hx bipolar disorder, schizophrenia   ICU delirium- improved now off all IV infusions. Acute biventricular HFrEF (Etiology sepsis, stress induced CM. Has clean coronaries) Cardiogenic shock resolved, EF improved.  Acute hypoxic respiratory failure Aspiration PNA Pulm edema   RLL VAP  AKI with oliguria; no baseline CKD - appears to have some renal  recovery, but Cr up some yesterday but good UOP Anemia, acute on chronic  DM2 with hyperglycemia  Severe protein calorie malnutrition  Pressure injury lip -  improving  Deconditioning    Appreciate [facility] care with multiple issues   Needs therapy and recouperation     Dispo - Medically stable for discharge.  Difficult placement.   LOS: 43 days    Patricia [name] 06/06/2022

## 2022-06-07 ENCOUNTER — Inpatient Hospital Stay (HOSPITAL_COMMUNITY): Payer: Medicaid Other

## 2022-06-07 DIAGNOSIS — R6521 Severe sepsis with septic shock: Secondary | ICD-10-CM | POA: Diagnosis not present

## 2022-06-07 DIAGNOSIS — A419 Sepsis, unspecified organism: Secondary | ICD-10-CM | POA: Diagnosis not present

## 2022-06-07 LAB — RENAL FUNCTION PANEL
Albumin: 2.6 g/dL — ABNORMAL LOW (ref 3.5–5.0)
Anion gap: 10 (ref 5–15)
BUN: 24 mg/dL — ABNORMAL HIGH (ref 6–20)
CO2: 17 mmol/L — ABNORMAL LOW (ref 22–32)
Calcium: 9.6 mg/dL (ref 8.9–10.3)
Chloride: 109 mmol/L (ref 98–111)
Creatinine, Ser: 0.97 mg/dL (ref 0.44–1.00)
GFR, Estimated: >60 mL/min (ref >60)
Glucose, Bld: 130 mg/dL — ABNORMAL HIGH (ref 70–99)
Phosphorus: 4.3 mg/dL (ref 2.5–4.6)
Potassium: 4.5 mmol/L (ref 3.5–5.1)
Sodium: 136 mmol/L (ref 135–145)

## 2022-06-07 LAB — CBC
HCT: 31.4 % — ABNORMAL LOW (ref 36.0–46.0)
Hemoglobin: 9.5 g/dL — ABNORMAL LOW (ref 12.0–15.0)
MCH: 27.4 pg (ref 26.0–34.0)
MCHC: 30.3 g/dL (ref 30.0–36.0)
MCV: 90.5 fL (ref 80.0–100.0)
Platelets: 482 10*3/uL — ABNORMAL HIGH (ref 150–400)
RBC: 3.47 MIL/uL — ABNORMAL LOW (ref 3.87–5.11)
RDW: 17.3 % — ABNORMAL HIGH (ref 11.5–15.5)
WBC: 10.9 10*3/uL — ABNORMAL HIGH (ref 4.0–10.5)
nRBC: 0 % (ref 0.0–0.2)

## 2022-06-07 LAB — TROPONIN I (HIGH SENSITIVITY): Troponin I (High Sensitivity): 26 ng/L — ABNORMAL HIGH (ref <18)

## 2022-06-07 LAB — GLUCOSE, CAPILLARY
Glucose-Capillary: 128 mg/dL — ABNORMAL HIGH (ref 70–99)
Glucose-Capillary: 130 mg/dL — ABNORMAL HIGH (ref 70–99)
Glucose-Capillary: 135 mg/dL — ABNORMAL HIGH (ref 70–99)
Glucose-Capillary: 138 mg/dL — ABNORMAL HIGH (ref 70–99)

## 2022-06-07 MED ORDER — ZINC SULFATE 220 (50 ZN) MG PO CAPS
220.0000 mg | ORAL_CAPSULE | Freq: Every day | ORAL | Status: AC
  Administered 2022-06-07 (×2): 220 mg
  Filled 2022-06-07: qty 1

## 2022-06-07 NOTE — Plan of Care (Signed)
  Problem: Education: Goal: Knowledge of General Education information will improve Description: Including pain rating scale, medication(s)/side effects and non-pharmacologic comfort measures Outcome: Progressing   Problem: Activity: Goal: Risk for activity intolerance will decrease Outcome: Progressing   Problem: Coping: Goal: Level of anxiety will decrease Outcome: Progressing   Problem: Pain Managment: Goal: General experience of comfort will improve Outcome: Progressing   Problem: Safety: Goal: Ability to remain free from injury will improve Outcome: Progressing   Problem: Coping: Goal: Ability to adjust to condition or change in health will improve Outcome: Progressing   Problem: Education: Goal: Understanding of CV disease, CV risk reduction, and recovery process will improve Outcome: Progressing   Problem: Education: Goal: 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 Outcome: Progressing

## 2022-06-07 NOTE — Progress Notes (Signed)
37 Days Post-Op   Subjective/Chief Complaint: Threw up a little during trach care.  Feeling okay today.    Objective: Vital signs in last 24 hours: Temp:  [98.1 F (36.7 C)-98.5 F (36.9 C)] 98.5 F (36.9 C) (11/10 0744) Pulse Rate:  [88-105] 105 (11/10 0837) Resp:  [17-20] 18 (11/10 0837) BP: (122-134)/(84-94) 127/94 (11/10 0744) SpO2:  [97 %-100 %] 99 % (11/10 0837) FiO2 (%):  [28 %] 28 % (11/10 0837) Last BM Date : 06/06/22  Intake/Output from previous day: 11/09 0701 - 11/10 0700 In: 0  Out: 900 [Urine:900] Intake/Output this shift: No intake/output data recorded.  Trach in place Abd - soft, Nontender  Lab Results:  Recent Labs    06/05/22 0638  WBC 8.2  HGB 8.6*  HCT 28.7*  PLT 460*    BMET Recent Labs    06/05/22 0638  NA 139  K 4.1  CL 114*  CO2 17*  GLUCOSE 139*  BUN 27*  CREATININE 1.10*  CALCIUM 9.3    PT/INR No results for input(s): "LABPROT", "INR" in the last 72 hours. ABG No results for input(s): "PHART", "HCO3" in the last 72 hours.  Invalid input(s): "PCO2", "PO2"  Studies/Results: No results found.  Anti-infectives: Anti-infectives (From admission, onward)    Start     Dose/Rate Route Frequency Ordered Stop   06/04/22 2100  ceFAZolin (ANCEF) IVPB 2g/100 mL premix        2 g 200 mL/hr over 30 Minutes Intravenous Every 8 hours 06/04/22 1103 06/05/22 2037   06/01/22 1515  ceFEPIme (MAXIPIME) 2 g in sodium chloride 0.9 % 100 mL IVPB  Status:  Discontinued        2 g 200 mL/hr over 30 Minutes Intravenous Every 12 hours 06/01/22 1420 06/04/22 1103   05/15/22 1445  ceFAZolin (ANCEF) IVPB 2g/100 mL premix       Note to Pharmacy: To be given in IR   2 g 200 mL/hr over 30 Minutes Intravenous  Once 05/15/22 1352 05/15/22 1616   05/15/22 1230  ceFAZolin (ANCEF) powder 1 g  Status:  Discontinued        1 g Other To Surgery 05/15/22 1140 05/15/22 1239   05/15/22 1230  ceFAZolin (ANCEF) IVPB 2g/100 mL premix        2 g 200 mL/hr over  30 Minutes Intravenous To Radiology 05/15/22 1140 05/15/22 1140   05/15/22 0000  ceFAZolin (ANCEF) IVPB 2g/100 mL premix        2 g 200 mL/hr over 30 Minutes Intravenous To Radiology 05/14/22 1308 05/15/22 0005   05/13/22 2134  meropenem (MERREM) 500 mg in sodium chloride 0.9 % 100 mL IVPB        500 mg 200 mL/hr over 30 Minutes Intravenous Every 24 hours 05/13/22 0815 05/17/22 0700   05/13/22 1000  meropenem (MERREM) 500 mg in sodium chloride 0.9 % 100 mL IVPB  Status:  Discontinued        500 mg 200 mL/hr over 30 Minutes Intravenous Every 12 hours 05/12/22 0940 05/12/22 0946   05/12/22 2200  meropenem (MERREM) 500 mg in sodium chloride 0.9 % 100 mL IVPB  Status:  Discontinued        500 mg 200 mL/hr over 30 Minutes Intravenous Every 12 hours 05/12/22 0946 05/13/22 0815   05/12/22 2000  meropenem (MERREM) 1 g in sodium chloride 0.9 % 100 mL IVPB  Status:  Discontinued        1 g 200 mL/hr over 30  Minutes Intravenous  Once 05/12/22 0940 05/12/22 0946   05/06/22 1530  meropenem (MERREM) 1 g in sodium chloride 0.9 % 100 mL IVPB  Status:  Discontinued        1 g 200 mL/hr over 30 Minutes Intravenous Every 8 hours 05/06/22 1438 05/12/22 0940   05/04/22 1000  vancomycin (VANCOCIN) IVPB 1000 mg/200 mL premix  Status:  Discontinued        1,000 mg 200 mL/hr over 60 Minutes Intravenous Every 24 hours 05/03/22 0802 05/08/22 0913   05/03/22 0845  vancomycin (VANCOREADY) IVPB 2000 mg/400 mL        2,000 mg 200 mL/hr over 120 Minutes Intravenous  Once 05/03/22 0751 05/03/22 1114   05/01/22 1800  piperacillin-tazobactam (ZOSYN) IVPB 3.375 g  Status:  Discontinued        3.375 g 12.5 mL/hr over 240 Minutes Intravenous Every 6 hours 05/01/22 1426 05/01/22 1427   05/01/22 1515  piperacillin-tazobactam (ZOSYN) IVPB 3.375 g  Status:  Discontinued        3.375 g 100 mL/hr over 30 Minutes Intravenous Every 6 hours 05/01/22 1428 05/06/22 1438   04/30/22 1400  piperacillin-tazobactam (ZOSYN) IVPB 2.25 g   Status:  Discontinued        2.25 g 100 mL/hr over 30 Minutes Intravenous Every 8 hours 04/30/22 1325 05/01/22 1426   04/29/22 1530  piperacillin-tazobactam (ZOSYN) IVPB 3.375 g  Status:  Discontinued        3.375 g 12.5 mL/hr over 240 Minutes Intravenous Every 8 hours 04/29/22 1433 04/30/22 1325   04/29/22 1145  metroNIDAZOLE (FLAGYL) IVPB 500 mg  Status:  Discontinued        500 mg 100 mL/hr over 60 Minutes Intravenous Every 12 hours 04/29/22 1047 04/29/22 1433   04/29/22 1145  vancomycin (VANCOCIN) IVPB 1000 mg/200 mL premix  Status:  Discontinued        1,000 mg 200 mL/hr over 60 Minutes Intravenous  Once 04/29/22 1047 04/29/22 1055   04/29/22 1145  vancomycin (VANCOREADY) IVPB 1500 mg/300 mL  Status:  Discontinued        1,500 mg 150 mL/hr over 120 Minutes Intravenous Every 24 hours 04/29/22 1055 04/29/22 1357   04/29/22 1045  ceFEPIme (MAXIPIME) 2 g in sodium chloride 0.9 % 100 mL IVPB  Status:  Discontinued        2 g 200 mL/hr over 30 Minutes Intravenous Every 8 hours 04/29/22 0954 04/29/22 1433   04/23/22 0730  ceFAZolin (ANCEF) IVPB 2g/100 mL premix        2 g 200 mL/hr over 30 Minutes Intravenous On call to O.R. 04/23/22 0726 04/23/22 0828       Assessment/Plan: Patricia [name] is s/p robotic incisional hernia repair on 04/23/22 - Tube feeds to goal - Pain medication prn - Okay for diet as deemed fit by speech therapy team   Acute metabolic encephalopathy superimposed on hx bipolar disorder, schizophrenia   ICU delirium- improved now off all IV infusions. Acute biventricular HFrEF (Etiology sepsis, stress induced CM. Has clean coronaries) Cardiogenic shock resolved, EF improved.  Acute hypoxic respiratory failure Aspiration PNA Pulm edema   RLL VAP  AKI with oliguria; no baseline CKD - appears to have some renal recovery, but Cr up some yesterday but good UOP Anemia, acute on chronic  DM2 with hyperglycemia  Severe protein calorie malnutrition  Pressure injury lip -  improving  Deconditioning    Appreciate [facility] care with multiple issues   Needs therapy and recouperation  Dispo - Medically stable for discharge to SNF.  Difficult placement.   LOS: 44 days    Patricia [name] Patricia [name] 06/07/2022

## 2022-06-07 NOTE — Progress Notes (Signed)
Patient seen today by trach team for consult.  No education is needed at this time.  All the necessary equipment is at the beside.   Will continue to follow for progression.

## 2022-06-07 NOTE — Progress Notes (Signed)
PROGRESS NOTE    Patricia [name]  GQB:[id number] DOB: 1971-01-15 DOA: 04/23/2022 PCP: [name], MD   Brief Narrative:   51 year old with history of DM2, depression, migraine, asthma, schizophrenia, HTN, HLD presented to the hospital for exertional hernia, robotic hernia repair on 04/23/2022 with mesh who developed postop ileus and is on TNA.  Also patient developed vomiting and hypotension after NG tube came out on 04/29/2022 with concerns of pneumonia which was treated with antibiotics and IV fluids.  During that time patient also required Levophed and eventually intubated on 10/3 for worsening metabolic acidosis complicated by acute kidney injury.  Echocardiogram showed depressed EF of 30-35% with elevated troponin.  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  Patient was seen by CHF team started on dopamine and CRRT as well.  Patient was again reintubated due to neuromuscular weakness on 10/8.  Eventually extubated on 10/14 to BiPAP and trach was placed due to weakness.  Tube feeds held due to nausea vomiting on 10/30.  Hospital course complicated by hypernatremia, she developed AKI, requiring brief course of HD, renal function has recovered.  Subjective:  -no further vomiting, no events overnight  Assessment and Plan:  Septic shock ([place]) secondary to aspiration pneumonia, right lower lobe Acute respiratory failure with prolonged mechanical ventilation status post tracheostomy -Trach management by pulmonary team. - Bronchodilators as needed, supportive care.  Out of bed to chair -toelrating PMV -Followed by another aspiration event, started empirically on cefepime, sputum culture growing Klebsiella, then ancef, completed Abx 11/8 -Patient failed MBS on Friday, remains n.p.o., only ice chips, feeding via cortrak only SLP following for weekly swallow eval's, if fails again may need to consider PEG tube placement -await SLP Re-eval  Status post hernia repair  complicated by postop ileus/inflammatory enteritis Intermittent nausea vomiting -Management by general surgery.  She is working with speech and swallow.  Remains n.p.o., PMV.  Tube feeds, SLP following for dysphagia progression -Management per primary surgical team, repeat CT abdomen with no acute abdominal findings. -monitor  Hypernatremia - resolved, if sodium started to increase then will start on D5W , as surgery would like to avoid excessive free water via cortrak, as it usually causes nausea, vomiting and abdominal pain .  AKI -Likely due to ATN in the setting of cardiogenic/septic shock, required CRRT 10/410/14, first HD on 10/16, renal function has recovered.  Right knee pain - Unclear etiology, no evidence of trauma.  Negative x-ray  Acute kidney injury, improving -Initially required CRRT and HD.  Now improving without dialysis  Biventricular failure, reduced EF - Last seen by CHF team 10/16.  Previously tried Bumex with minimal to no urine output.  At this point patient is on dialysis and has been well compensated.  Plans to follow-up outpatient. LHC shows clean coronaries.   Sinus tachycardia; improved.  - Now on metoprolol 50 mg twice daily.  IV as needed  Anemia of chronic disease - From underlying multiple medical issues.  Transfuse as necessary and attempt to keep hemoglobin greater than 8  Essential hypertension -Currently on metoprolol twice daily  Chronic low back pain -As needed pain control  Morbid obesity (HCC) -Body mass index is 41.37 kg/m..  Currently on tube feeds -Follow-up outpatient, weight loss diet and exercise  BIPOLAR DISORDER UNSPECIFIED -On Klonopin.  Should slowly resume home meds as appropriate  Diabetes mellitus type 2 in obese (HCC) -Sliding scale and Accu-Cheks. -stable  Pressure area on the lip in the setting of ET tube  placement which was not present prior to admission.  Routine wound care.  PT-CIR   DVT prophylaxis: Subcu  heparin Code Status: Full code Family Communication:  none at bedside  Final dispo per general surgery  Nutritional status    Signs/Symptoms: NPO status (with need for MBS per SLP)  Interventions: Tube feeding, Prostat, MVI  Body mass index is 28 kg/m.      Examination:  Gen: Awake, Alert, Oriented X 3,  HEENT: no JVD, cortrak and trach collar Lungs: Good air movement bilaterally, CTAB CVS: S1S2/RRR Abd: soft, Non tender, non distended, BS present Extremities: No edema Skin: no new rashes on exposed skin    Objective: Vitals:   06/07/22 0424 06/07/22 0550 06/07/22 0744 06/07/22 0837  BP:  134/87 (!) 127/94   Pulse: 94 97 (!) 105 (!) 105  Resp: '19 18 18 18  '$ Temp:  98.3 F (36.8 C) 98.5 F (36.9 C)   TempSrc:  Oral Oral   SpO2: 97% 100% 100% 99%  Weight:      Height:        Intake/Output Summary (Last 24 hours) at 06/07/2022 1350 Last data filed at 06/06/2022 1656 Gross per 24 hour  Intake 0 ml  Output 100 ml  Net -100 ml    Filed Weights   06/03/22 0500 06/04/22 0500 06/05/22 0500  Weight: 80.4 kg 78.7 kg 74 kg     Data Reviewed:   CBC: Recent Labs  Lab 06/02/22 1808 06/03/22 0410 06/04/22 0529 06/05/22 0638 06/07/22 0949  WBC 8.7 7.7 7.6 8.2 10.9*  HGB 7.9* 7.8* 8.7* 8.6* 9.5*  HCT 25.7* 26.2* 29.0* 28.7* 31.4*  MCV 90.2 91.6 91.8 90.8 90.5  PLT 435* 448* 461* 460* 034*   Basic Metabolic Panel: Recent Labs  Lab 06/01/22 0220 06/02/22 1040 06/03/22 0410 06/04/22 0529 06/05/22 0638 06/07/22 0949  NA 147* 144 141 143 139 136  K 4.2 3.8 3.6 4.1 4.1 4.5  CL 117* 117* 114* 111 114* 109  CO2 20* 19* 20* 18* 17* 17*  GLUCOSE 107* 152* 125* 132* 139* 130*  BUN 43* 34* 28* 29* 27* 24*  CREATININE 2.03* 1.54* 1.34* 1.21* 1.10* 0.97  CALCIUM 8.7* 8.8* 8.9 9.6 9.3 9.6  MG 1.8 1.8 1.8 1.9  --   --   PHOS 4.4 3.8 3.9 4.1 4.6 4.3   GFR: Estimated Creatinine Clearance: 66.8 mL/min (by C-G formula based on SCr of 0.97 mg/dL). Liver  Function Tests: Recent Labs  Lab 06/02/22 1040 06/03/22 0410 06/04/22 0529 06/05/22 7425 06/07/22 0949  ALBUMIN 2.1* 2.1* 2.3* 2.4* 2.6*   No results for input(s): "LIPASE", "AMYLASE" in the last 168 hours. No results for input(s): "AMMONIA" in the last 168 hours. Coagulation Profile: No results for input(s): "INR", "PROTIME" in the last 168 hours. Cardiac Enzymes: No results for input(s): "CKTOTAL", "CKMB", "CKMBINDEX", "TROPONINI" in the last 168 hours. BNP (last 3 results) No results for input(s): "PROBNP" in the last 8760 hours. HbA1C: No results for input(s): "HGBA1C" in the last 72 hours. CBG: Recent Labs  Lab 06/06/22 1203 06/06/22 1648 06/07/22 0016 06/07/22 0545 06/07/22 1207  GLUCAP 146* 118* 138* 128* 135*   Lipid Profile: No results for input(s): "CHOL", "HDL", "LDLCALC", "TRIG", "CHOLHDL", "LDLDIRECT" in the last 72 hours. Thyroid Function Tests: No results for input(s): "TSH", "T4TOTAL", "FREET4", "T3FREE", "THYROIDAB" in the last 72 hours. Anemia Panel: No results for input(s): "VITAMINB12", "FOLATE", "FERRITIN", "TIBC", "IRON", "RETICCTPCT" in the last 72 hours. Sepsis Labs: No results for input(s): "  PROCALCITON", "LATICACIDVEN" in the last 168 hours.  Recent Results (from the past 240 hour(s))  Culture, Respiratory w Gram Stain     Status: None   Collection Time: 06/01/22  8:39 AM   Specimen: Tracheal Aspirate; Respiratory  Result Value Ref Range Status   Specimen Description TRACHEAL ASPIRATE  Final   Special Requests NONE  Final   Gram Stain   Final    ABUNDANT WBC PRESENT, PREDOMINANTLY PMN MODERATE GRAM POSITIVE COCCI IN CHAINS IN PAIRS    Culture   Final    RARE KLEBSIELLA PNEUMONIAE WITHIN NORMAL RESPIRATORY FLORA Performed at [facility], [place] [address], [place], [place] [address]    Report Status 06/03/2022 FINAL  Final   Organism ID, Bacteria KLEBSIELLA PNEUMONIAE  Final      Susceptibility   Klebsiella pneumoniae - MIC*     AMPICILLIN >=32 RESISTANT Resistant     CEFAZOLIN <=4 SENSITIVE Sensitive     CEFEPIME <=0.12 SENSITIVE Sensitive     CEFTAZIDIME <=1 SENSITIVE Sensitive     CEFTRIAXONE <=0.25 SENSITIVE Sensitive     CIPROFLOXACIN <=0.25 SENSITIVE Sensitive     GENTAMICIN <=1 SENSITIVE Sensitive     IMIPENEM <=0.25 SENSITIVE Sensitive     TRIMETH/SULFA >=320 RESISTANT Resistant     AMPICILLIN/SULBACTAM 8 SENSITIVE Sensitive     PIP/TAZO <=4 SENSITIVE Sensitive     * RARE KLEBSIELLA PNEUMONIAE     Scheduled Meds:  acetaminophen (TYLENOL) oral liquid 160 mg/5 mL  650 mg Per Tube Q4H   ascorbic acid  250 mg Oral Daily   famotidine  20 mg Per Tube BID   feeding supplement (PROSource TF20)  60 mL Per Tube Daily   fiber  1 packet Per Tube TID   gabapentin  200 mg Per Tube Q8H   heparin injection (subcutaneous)  5,000 Units Subcutaneous Q8H   insulin aspart  0-20 Units Subcutaneous Q6H   insulin aspart  4 Units Subcutaneous Q6H   metoprolol tartrate  50 mg Per Tube BID   multivitamin with minerals  1 tablet Per Tube Daily   nystatin   Topical TID   mouth rinse  15 mL Mouth Rinse 4 times per day   polyethylene glycol  17 g Oral Daily   QUEtiapine  25 mg Per Tube QHS   senna  1 tablet Per Tube Daily   zinc sulfate  220 mg Per Tube Daily   Continuous Infusions:  feeding supplement (VITAL 1.5 CAL) 1,000 mL (06/05/22 1858)   methocarbamol (ROBAXIN) IV       LOS: 44 days      Domenic Polite, MD [facility]  If 7PM-7AM, please contact night-coverage  06/07/2022, 1:50 PM

## 2022-06-08 DIAGNOSIS — A419 Sepsis, unspecified organism: Secondary | ICD-10-CM | POA: Diagnosis not present

## 2022-06-08 DIAGNOSIS — R6521 Severe sepsis with septic shock: Secondary | ICD-10-CM | POA: Diagnosis not present

## 2022-06-08 LAB — TROPONIN I (HIGH SENSITIVITY): Troponin I (High Sensitivity): 27 ng/L — ABNORMAL HIGH (ref <18)

## 2022-06-08 LAB — GLUCOSE, CAPILLARY
Glucose-Capillary: 116 mg/dL — ABNORMAL HIGH (ref 70–99)
Glucose-Capillary: 118 mg/dL — ABNORMAL HIGH (ref 70–99)
Glucose-Capillary: 118 mg/dL — ABNORMAL HIGH (ref 70–99)
Glucose-Capillary: 130 mg/dL — ABNORMAL HIGH (ref 70–99)

## 2022-06-08 LAB — RENAL FUNCTION PANEL
Albumin: 2.5 g/dL — ABNORMAL LOW (ref 3.5–5.0)
Anion gap: 8 (ref 5–15)
BUN: 27 mg/dL — ABNORMAL HIGH (ref 6–20)
CO2: 17 mmol/L — ABNORMAL LOW (ref 22–32)
Calcium: 9.2 mg/dL (ref 8.9–10.3)
Chloride: 109 mmol/L (ref 98–111)
Creatinine, Ser: 0.87 mg/dL (ref 0.44–1.00)
GFR, Estimated: >60 mL/min (ref >60)
Glucose, Bld: 126 mg/dL — ABNORMAL HIGH (ref 70–99)
Phosphorus: 4.7 mg/dL — ABNORMAL HIGH (ref 2.5–4.6)
Potassium: 4.5 mmol/L (ref 3.5–5.1)
Sodium: 134 mmol/L — ABNORMAL LOW (ref 135–145)

## 2022-06-08 NOTE — Progress Notes (Signed)
PROGRESS NOTE    Patricia [name]  YKZ:[id number] DOB: 04-20-1971 DOA: 04/23/2022 PCP: [name], MD   Brief Narrative:   51 year old with history of DM2, depression, migraine, asthma, schizophrenia, HTN, HLD presented to the hospital for exertional hernia, robotic hernia repair on 04/23/2022 with mesh who developed postop ileus and is on TNA.  Also patient developed vomiting and hypotension after NG tube came out on 04/29/2022 with concerns of pneumonia which was treated with antibiotics and IV fluids.  During that time patient also required Levophed and eventually intubated on 10/3 for worsening metabolic acidosis complicated by acute kidney injury.  Echocardiogram showed depressed EF of 30-35% with elevated troponin.  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  Patient was seen by CHF team started on dopamine and CRRT as well.  Patient was again reintubated due to neuromuscular weakness on 10/8.  Eventually extubated on 10/14 to BiPAP and trach was placed due to weakness.  Tube feeds held due to nausea vomiting on 10/30.  Hospital course complicated by hypernatremia, she developed AKI, requiring brief course of HD, renal function has recovered.  Subjective:  -Transient chest pain overnight, resolved, EKG unremarkable, troponin negative  Assessment and Plan:  Septic shock ([place]) secondary to aspiration pneumonia, right lower lobe Acute respiratory failure with prolonged mechanical ventilation status post tracheostomy -Trach management by pulmonary team. - Bronchodilators as needed, supportive care.  Out of bed to chair -toelrating PMV -Followed by another aspiration event, started empirically on cefepime, sputum culture growing Klebsiella, then ancef, completed Abx 11/8 -Patient failed MBS on Friday, remains n.p.o., only ice chips, feeding via cortrak only SLP following for weekly swallow eval's, if fails again may need to consider PEG tube placement -await SLP  Re-eval-hopefully on Monday  Status post hernia repair complicated by postop ileus/inflammatory enteritis Intermittent nausea vomiting -Management by general surgery.  She is working with speech and swallow.  Remains n.p.o., PMV.  Tube feeds, SLP following for dysphagia progression -Management per primary surgical team, repeat CT abdomen with no acute abdominal findings. -monitor  Hypernatremia - resolved, if sodium started to increase then will start on D5W , as surgery would like to avoid excessive free water via cortrak, as it usually causes nausea, vomiting and abdominal pain .  AKI -Likely due to ATN in the setting of cardiogenic/septic shock, required CRRT 10/410/14, first HD on 10/16, renal function has recovered.  Right knee pain - Unclear etiology, no evidence of trauma.  Negative x-ray  Acute kidney injury, improving -Initially required CRRT and HD.  Now improving without dialysis  Biventricular failure, reduced EF - Last seen by CHF team 10/16.  Previously tried Bumex with minimal to no urine output.  At this point patient is on dialysis and has been well compensated.  Plans to follow-up outpatient. LHC shows clean coronaries.   Sinus tachycardia; improved.  - Now on metoprolol 50 mg twice daily.  IV as needed  Anemia of chronic disease - From underlying multiple medical issues.  Transfuse as necessary and attempt to keep hemoglobin greater than 8  Essential hypertension -Currently on metoprolol twice daily  Chronic low back pain -As needed pain control  Morbid obesity (HCC) -Body mass index is 41.37 kg/m..  Currently on tube feeds -Follow-up outpatient, weight loss diet and exercise  BIPOLAR DISORDER UNSPECIFIED -On Klonopin.  Should slowly resume home meds as appropriate  Diabetes mellitus type 2 in obese (HCC) -Sliding scale and Accu-Cheks. -stable  Pressure area on the lip in  the setting of ET tube placement which was not present prior to admission.   Routine wound care.  PT-CIR   DVT prophylaxis: Subcu heparin Code Status: Full code Family Communication:  none at bedside  Final dispo per general surgery  Nutritional status    Signs/Symptoms: NPO status (with need for MBS per SLP)  Interventions: Tube feeding, Prostat, MVI  Body mass index is 28 kg/m.      Examination:  Gen: AAOx3 no distress,  HEENT: no JVD, cortrak and trach collar CVS: S1-S2, regular rhythm Lungs: Few conducted upper airway sounds otherwise clear Abdomen: Soft, nontender, bowel sounds present Extremities: No edema Skin: no new rashes on exposed skin    Objective: Vitals:   06/08/22 0323 06/08/22 0636 06/08/22 0755 06/08/22 0755  BP:  135/89  112/78  Pulse: 97 95 (!) 109 (!) 106  Resp: '20 19 18 17  '$ Temp:  98.1 F (36.7 C)  97.8 F (36.6 C)  TempSrc:  Oral    SpO2: 99% 98% 99% 99%  Weight:      Height:        Intake/Output Summary (Last 24 hours) at 06/08/2022 1058 Last data filed at 06/08/2022 0300 Gross per 24 hour  Intake 3214.33 ml  Output --  Net 3214.33 ml    Filed Weights   06/03/22 0500 06/04/22 0500 06/05/22 0500  Weight: 80.4 kg 78.7 kg 74 kg     Data Reviewed:   CBC: Recent Labs  Lab 06/02/22 1808 06/03/22 0410 06/04/22 0529 06/05/22 0638 06/07/22 0949  WBC 8.7 7.7 7.6 8.2 10.9*  HGB 7.9* 7.8* 8.7* 8.6* 9.5*  HCT 25.7* 26.2* 29.0* 28.7* 31.4*  MCV 90.2 91.6 91.8 90.8 90.5  PLT 435* 448* 461* 460* 188*   Basic Metabolic Panel: Recent Labs  Lab 06/02/22 1040 06/03/22 0410 06/04/22 0529 06/05/22 0638 06/07/22 0949 06/08/22 0142  NA 144 141 143 139 136 134*  K 3.8 3.6 4.1 4.1 4.5 4.5  CL 117* 114* 111 114* 109 109  CO2 19* 20* 18* 17* 17* 17*  GLUCOSE 152* 125* 132* 139* 130* 126*  BUN 34* 28* 29* 27* 24* 27*  CREATININE 1.54* 1.34* 1.21* 1.10* 0.97 0.87  CALCIUM 8.8* 8.9 9.6 9.3 9.6 9.2  MG 1.8 1.8 1.9  --   --   --   PHOS 3.8 3.9 4.1 4.6 4.3 4.7*   GFR: Estimated Creatinine Clearance:  74.5 mL/min (by C-G formula based on SCr of 0.87 mg/dL). Liver Function Tests: Recent Labs  Lab 06/03/22 0410 06/04/22 0529 06/05/22 4166 06/07/22 0949 06/08/22 0142  ALBUMIN 2.1* 2.3* 2.4* 2.6* 2.5*   No results for input(s): "LIPASE", "AMYLASE" in the last 168 hours. No results for input(s): "AMMONIA" in the last 168 hours. Coagulation Profile: No results for input(s): "INR", "PROTIME" in the last 168 hours. Cardiac Enzymes: No results for input(s): "CKTOTAL", "CKMB", "CKMBINDEX", "TROPONINI" in the last 168 hours. BNP (last 3 results) No results for input(s): "PROBNP" in the last 8760 hours. HbA1C: No results for input(s): "HGBA1C" in the last 72 hours. CBG: Recent Labs  Lab 06/07/22 0545 06/07/22 1207 06/07/22 1714 06/07/22 2359 06/08/22 0754  GLUCAP 128* 135* 130* 118* 130*   Lipid Profile: No results for input(s): "CHOL", "HDL", "LDLCALC", "TRIG", "CHOLHDL", "LDLDIRECT" in the last 72 hours. Thyroid Function Tests: No results for input(s): "TSH", "T4TOTAL", "FREET4", "T3FREE", "THYROIDAB" in the last 72 hours. Anemia Panel: No results for input(s): "VITAMINB12", "FOLATE", "FERRITIN", "TIBC", "IRON", "RETICCTPCT" in the last 72 hours. Sepsis  Labs: No results for input(s): "PROCALCITON", "LATICACIDVEN" in the last 168 hours.  Recent Results (from the past 240 hour(s))  Culture, Respiratory w Gram Stain     Status: None   Collection Time: 06/01/22  8:39 AM   Specimen: Tracheal Aspirate; Respiratory  Result Value Ref Range Status   Specimen Description TRACHEAL ASPIRATE  Final   Special Requests NONE  Final   Gram Stain   Final    ABUNDANT WBC PRESENT, PREDOMINANTLY PMN MODERATE GRAM POSITIVE COCCI IN CHAINS IN PAIRS    Culture   Final    RARE KLEBSIELLA PNEUMONIAE WITHIN NORMAL RESPIRATORY FLORA Performed at [facility], [place] [address], [place], [place] [address]    Report Status 06/03/2022 FINAL  Final   Organism ID, Bacteria KLEBSIELLA PNEUMONIAE   Final      Susceptibility   Klebsiella pneumoniae - MIC*    AMPICILLIN >=32 RESISTANT Resistant     CEFAZOLIN <=4 SENSITIVE Sensitive     CEFEPIME <=0.12 SENSITIVE Sensitive     CEFTAZIDIME <=1 SENSITIVE Sensitive     CEFTRIAXONE <=0.25 SENSITIVE Sensitive     CIPROFLOXACIN <=0.25 SENSITIVE Sensitive     GENTAMICIN <=1 SENSITIVE Sensitive     IMIPENEM <=0.25 SENSITIVE Sensitive     TRIMETH/SULFA >=320 RESISTANT Resistant     AMPICILLIN/SULBACTAM 8 SENSITIVE Sensitive     PIP/TAZO <=4 SENSITIVE Sensitive     * RARE KLEBSIELLA PNEUMONIAE     Scheduled Meds:  acetaminophen (TYLENOL) oral liquid 160 mg/5 mL  650 mg Per Tube Q4H   famotidine  20 mg Per Tube BID   feeding supplement (PROSource TF20)  60 mL Per Tube Daily   fiber  1 packet Per Tube TID   gabapentin  200 mg Per Tube Q8H   heparin injection (subcutaneous)  5,000 Units Subcutaneous Q8H   insulin aspart  0-20 Units Subcutaneous Q6H   insulin aspart  4 Units Subcutaneous Q6H   metoprolol tartrate  50 mg Per Tube BID   multivitamin with minerals  1 tablet Per Tube Daily   nystatin   Topical TID   mouth rinse  15 mL Mouth Rinse 4 times per day   polyethylene glycol  17 g Oral Daily   QUEtiapine  25 mg Per Tube QHS   senna  1 tablet Per Tube Daily   Continuous Infusions:  feeding supplement (VITAL 1.5 CAL) 1,000 mL (06/05/22 1858)   methocarbamol (ROBAXIN) IV 500 mg (06/08/22 0031)     LOS: 45 days      Domenic Polite, MD [facility]  If 7PM-7AM, please contact night-coverage  06/08/2022, 10:58 AM

## 2022-06-08 NOTE — Progress Notes (Signed)
38 Days Post-Op   Subjective/Chief Complaint: Complains of abd soreness unchanged   Objective: Vital signs in last 24 hours: Temp:  [97.8 F (36.6 C)-99.1 F (37.3 C)] 97.8 F (36.6 C) (11/11 0755) Pulse Rate:  [95-109] 106 (11/11 0755) Resp:  [17-20] 17 (11/11 0755) BP: (112-135)/(78-98) 112/78 (11/11 0755) SpO2:  [97 %-100 %] 99 % (11/11 0755) FiO2 (%):  [28 %] 28 % (11/11 0755) Last BM Date : 06/07/22  Intake/Output from previous day: 11/10 0701 - 11/11 0700 In: 3214.3 [NG/GT:3164.3; IV Piggyback:50] Out: -  Intake/Output this shift: No intake/output data recorded.  General appearance: alert and cooperative Resp: clear to auscultation bilaterally Cardio: regular rate and rhythm GI: soft, mild tenderness. Having bm's  Lab Results:  Recent Labs    06/07/22 0949  WBC 10.9*  HGB 9.5*  HCT 31.4*  PLT 482*   BMET Recent Labs    06/07/22 0949 06/08/22 0142  NA 136 134*  K 4.5 4.5  CL 109 109  CO2 17* 17*  GLUCOSE 130* 126*  BUN 24* 27*  CREATININE 0.97 0.87  CALCIUM 9.6 9.2   PT/INR No results for input(s): "LABPROT", "INR" in the last 72 hours. ABG No results for input(s): "PHART", "HCO3" in the last 72 hours.  Invalid input(s): "PCO2", "PO2"  Studies/Results: DG CHEST PORT 1 VIEW  Result Date: 06/07/2022 CLINICAL DATA:  Chest pain and shortness of breath. EXAM: PORTABLE CHEST 1 VIEW COMPARISON:  Chest radiograph dated June 01, 2022 FINDINGS: The heart is enlarged. Low lung volumes with mild bibasilar atelectasis. Tracheostomy tube is unchanged. Feeding tube coursing below the diaphragm projecting over the stomach. Thoracic spondylosis. IMPRESSION: 1. Low lung volumes with mild bibasilar atelectasis. 2. Cardiomegaly. Electronically Signed   By: Keane Police D.O.   On: 06/07/2022 22:39    Anti-infectives: Anti-infectives (From admission, onward)    Start     Dose/Rate Route Frequency Ordered Stop   06/04/22 2100  ceFAZolin (ANCEF) IVPB 2g/100 mL  premix        2 g 200 mL/hr over 30 Minutes Intravenous Every 8 hours 06/04/22 1103 06/05/22 2037   06/01/22 1515  ceFEPIme (MAXIPIME) 2 g in sodium chloride 0.9 % 100 mL IVPB  Status:  Discontinued        2 g 200 mL/hr over 30 Minutes Intravenous Every 12 hours 06/01/22 1420 06/04/22 1103   05/15/22 1445  ceFAZolin (ANCEF) IVPB 2g/100 mL premix       Note to Pharmacy: To be given in IR   2 g 200 mL/hr over 30 Minutes Intravenous  Once 05/15/22 1352 05/15/22 1616   05/15/22 1230  ceFAZolin (ANCEF) powder 1 g  Status:  Discontinued        1 g Other To Surgery 05/15/22 1140 05/15/22 1239   05/15/22 1230  ceFAZolin (ANCEF) IVPB 2g/100 mL premix        2 g 200 mL/hr over 30 Minutes Intravenous To Radiology 05/15/22 1140 05/15/22 1140   05/15/22 0000  ceFAZolin (ANCEF) IVPB 2g/100 mL premix        2 g 200 mL/hr over 30 Minutes Intravenous To Radiology 05/14/22 1308 05/15/22 0005   05/13/22 2134  meropenem (MERREM) 500 mg in sodium chloride 0.9 % 100 mL IVPB        500 mg 200 mL/hr over 30 Minutes Intravenous Every 24 hours 05/13/22 0815 05/17/22 0700   05/13/22 1000  meropenem (MERREM) 500 mg in sodium chloride 0.9 % 100 mL IVPB  Status:  Discontinued        500 mg 200 mL/hr over 30 Minutes Intravenous Every 12 hours 05/12/22 0940 05/12/22 0946   05/12/22 2200  meropenem (MERREM) 500 mg in sodium chloride 0.9 % 100 mL IVPB  Status:  Discontinued        500 mg 200 mL/hr over 30 Minutes Intravenous Every 12 hours 05/12/22 0946 05/13/22 0815   05/12/22 2000  meropenem (MERREM) 1 g in sodium chloride 0.9 % 100 mL IVPB  Status:  Discontinued        1 g 200 mL/hr over 30 Minutes Intravenous  Once 05/12/22 0940 05/12/22 0946   05/06/22 1530  meropenem (MERREM) 1 g in sodium chloride 0.9 % 100 mL IVPB  Status:  Discontinued        1 g 200 mL/hr over 30 Minutes Intravenous Every 8 hours 05/06/22 1438 05/12/22 0940   05/04/22 1000  vancomycin (VANCOCIN) IVPB 1000 mg/200 mL premix  Status:   Discontinued        1,000 mg 200 mL/hr over 60 Minutes Intravenous Every 24 hours 05/03/22 0802 05/08/22 0913   05/03/22 0845  vancomycin (VANCOREADY) IVPB 2000 mg/400 mL        2,000 mg 200 mL/hr over 120 Minutes Intravenous  Once 05/03/22 0751 05/03/22 1114   05/01/22 1800  piperacillin-tazobactam (ZOSYN) IVPB 3.375 g  Status:  Discontinued        3.375 g 12.5 mL/hr over 240 Minutes Intravenous Every 6 hours 05/01/22 1426 05/01/22 1427   05/01/22 1515  piperacillin-tazobactam (ZOSYN) IVPB 3.375 g  Status:  Discontinued        3.375 g 100 mL/hr over 30 Minutes Intravenous Every 6 hours 05/01/22 1428 05/06/22 1438   04/30/22 1400  piperacillin-tazobactam (ZOSYN) IVPB 2.25 g  Status:  Discontinued        2.25 g 100 mL/hr over 30 Minutes Intravenous Every 8 hours 04/30/22 1325 05/01/22 1426   04/29/22 1530  piperacillin-tazobactam (ZOSYN) IVPB 3.375 g  Status:  Discontinued        3.375 g 12.5 mL/hr over 240 Minutes Intravenous Every 8 hours 04/29/22 1433 04/30/22 1325   04/29/22 1145  metroNIDAZOLE (FLAGYL) IVPB 500 mg  Status:  Discontinued        500 mg 100 mL/hr over 60 Minutes Intravenous Every 12 hours 04/29/22 1047 04/29/22 1433   04/29/22 1145  vancomycin (VANCOCIN) IVPB 1000 mg/200 mL premix  Status:  Discontinued        1,000 mg 200 mL/hr over 60 Minutes Intravenous  Once 04/29/22 1047 04/29/22 1055   04/29/22 1145  vancomycin (VANCOREADY) IVPB 1500 mg/300 mL  Status:  Discontinued        1,500 mg 150 mL/hr over 120 Minutes Intravenous Every 24 hours 04/29/22 1055 04/29/22 1357   04/29/22 1045  ceFEPIme (MAXIPIME) 2 g in sodium chloride 0.9 % 100 mL IVPB  Status:  Discontinued        2 g 200 mL/hr over 30 Minutes Intravenous Every 8 hours 04/29/22 0954 04/29/22 1433   04/23/22 0730  ceFAZolin (ANCEF) IVPB 2g/100 mL premix        2 g 200 mL/hr over 30 Minutes Intravenous On call to O.R. 04/23/22 8144 04/23/22 0828       Assessment/Plan: s/p Procedure(s): Coronary/Graft  Acute MI Revascularization (N/A) LEFT HEART CATH AND CORONARY ANGIOGRAPHY (N/A) RIGHT/LEFT HEART CATH AND CORONARY ANGIOGRAPHY (N/A) Continue tube feeds at goal Ms. Balestrieri is s/p robotic incisional hernia repair on 04/23/22 - Tube feeds  to goal - Pain medication prn - Okay for diet as deemed fit by speech therapy team   Acute metabolic encephalopathy superimposed on hx bipolar disorder, schizophrenia   ICU delirium- improved now off all IV infusions. Acute biventricular HFrEF (Etiology sepsis, stress induced CM. Has clean coronaries) Cardiogenic shock resolved, EF improved.  Acute hypoxic respiratory failure Aspiration PNA Pulm edema   RLL VAP  AKI with oliguria; no baseline CKD - appears to have some renal recovery, but Cr up some yesterday but good UOP Anemia, acute on chronic  DM2 with hyperglycemia  Severe protein calorie malnutrition  Pressure injury lip - improving  Deconditioning    Appreciate [facility] care with multiple issues   Needs therapy and recouperation     Dispo - Medically stable for discharge to SNF.  Difficult placement.  LOS: 45 days    Patricia [name] 06/08/2022

## 2022-06-08 NOTE — Progress Notes (Signed)
   06/07/22 2156  Notify: Provider  Provider Name/Title Zebedee Iba  Date Provider Notified 06/07/22  Time Provider Notified 2159  Method of Notification Page  Notification Reason Other (Comment) (Pt c/o acute CP at rest. Unable to describe intensity and quality of pain. Denies SOB. No diaphoresis noted. Pain does not radiate. Pain worse with movement and inspiration.)  Provider response See new orders (STAT Trop and CXR)  Date of Provider Response 06/07/22  Time of Provider Response 2209   Nitro tabs given x 2. Relief from CP noted. EKG obtained - showing ST. T wave abnormality. APP on call notified. However, EKG result will not crossover to EPIC.

## 2022-06-08 NOTE — Progress Notes (Addendum)
       CROSS COVER NOTE  NAME: Patricia [name] MRN: [id number] DOB : [birth year]    Date of Service   06/08/2022   HPI/Events of Note   Notified by bedside RN of patient experiencing chest pain. Hx of unstable angina with nuc study (2015) negative for ischemia,  systolic HF w/ EF 38-32%.   RN has completed 12-lead EKG, this EKG is currently not loaded into patient chart.  However, RN states that EKG reads as "sinus tachycardia," which aligns with previous EKG readings.  RN has already started to give nitroglycerin sublingual x2, and patient has felt some relief.  Although patient is oriented, she has difficulty describing intensity and quality of pain. She denies shortness of breath, nausea, vomiting, heartburn, abdominal discomfort, or palpitations.  RN does not note any diaphoresis.  Patient is able to confirm that she has had similar pain in the past.  Work-up: Troponin --> 26, 27 Chest x-ray --> cardiomegaly, low lung volumes with mild atelectasis. RN to administer Maalox and oxycodone  Upon further assessment, RN reports pain is localized closer to the right rib cage. Patient confirms that pain does not radiate, but does worsen with movement and deep inspiration. RN does not note any respiratory complications with this patient.  Patent airway with tracheostomy on 5 L trach collar.  No secretions reported by RN.   0030-RN reports that patient is resting comfortably after maalox and oxy combo.  No additional reports of chest pain moment.  Will trend troponin again --> 27   Interventions/ Plan   Troponin levels       Raenette Rover, DNP, [place]

## 2022-06-09 ENCOUNTER — Inpatient Hospital Stay (HOSPITAL_COMMUNITY): Payer: Medicaid Other

## 2022-06-09 DIAGNOSIS — A419 Sepsis, unspecified organism: Secondary | ICD-10-CM | POA: Diagnosis not present

## 2022-06-09 DIAGNOSIS — R6521 Severe sepsis with septic shock: Secondary | ICD-10-CM | POA: Diagnosis not present

## 2022-06-09 LAB — RENAL FUNCTION PANEL
Albumin: 2.6 g/dL — ABNORMAL LOW (ref 3.5–5.0)
Anion gap: 12 (ref 5–15)
BUN: 25 mg/dL — ABNORMAL HIGH (ref 6–20)
CO2: 18 mmol/L — ABNORMAL LOW (ref 22–32)
Calcium: 9.6 mg/dL (ref 8.9–10.3)
Chloride: 107 mmol/L (ref 98–111)
Creatinine, Ser: 1.06 mg/dL — ABNORMAL HIGH (ref 0.44–1.00)
GFR, Estimated: >60 mL/min (ref >60)
Glucose, Bld: 128 mg/dL — ABNORMAL HIGH (ref 70–99)
Phosphorus: 5.1 mg/dL — ABNORMAL HIGH (ref 2.5–4.6)
Potassium: 5.1 mmol/L (ref 3.5–5.1)
Sodium: 137 mmol/L (ref 135–145)

## 2022-06-09 LAB — CBC
HCT: 31.1 % — ABNORMAL LOW (ref 36.0–46.0)
Hemoglobin: 9.6 g/dL — ABNORMAL LOW (ref 12.0–15.0)
MCH: 27.9 pg (ref 26.0–34.0)
MCHC: 30.9 g/dL (ref 30.0–36.0)
MCV: 90.4 fL (ref 80.0–100.0)
Platelets: 418 10*3/uL — ABNORMAL HIGH (ref 150–400)
RBC: 3.44 MIL/uL — ABNORMAL LOW (ref 3.87–5.11)
RDW: 17.5 % — ABNORMAL HIGH (ref 11.5–15.5)
WBC: 16.4 10*3/uL — ABNORMAL HIGH (ref 4.0–10.5)
nRBC: 0 % (ref 0.0–0.2)

## 2022-06-09 LAB — GLUCOSE, CAPILLARY
Glucose-Capillary: 135 mg/dL — ABNORMAL HIGH (ref 70–99)
Glucose-Capillary: 136 mg/dL — ABNORMAL HIGH (ref 70–99)
Glucose-Capillary: 162 mg/dL — ABNORMAL HIGH (ref 70–99)
Glucose-Capillary: 98 mg/dL (ref 70–99)

## 2022-06-09 MED ORDER — PEPTAMEN 1.5 CAL PO LIQD
1000.0000 mL | ORAL | Status: DC
  Administered 2022-06-09: 1000 mL
  Filled 2022-06-09 (×6): qty 1000

## 2022-06-09 MED ORDER — METHYLPREDNISOLONE SODIUM SUCC 125 MG IJ SOLR
125.0000 mg | Freq: Once | INTRAMUSCULAR | Status: AC
  Administered 2022-06-09: 125 mg via INTRAVENOUS
  Filled 2022-06-09: qty 2

## 2022-06-09 MED ORDER — DIPHENHYDRAMINE HCL 25 MG PO CAPS: 25.0000 mg | ORAL_CAPSULE | Freq: Once | ORAL | Status: AC

## 2022-06-09 MED ORDER — IOHEXOL 350 MG/ML SOLN
75.0000 mL | Freq: Once | INTRAVENOUS | Status: AC | PRN
  Administered 2022-06-09: 75 mL via INTRAVENOUS

## 2022-06-09 MED ORDER — DIPHENHYDRAMINE HCL 50 MG/ML IJ SOLN
25.0000 mg | Freq: Once | INTRAMUSCULAR | Status: AC
  Administered 2022-06-09: 25 mg via INTRAVENOUS
  Filled 2022-06-09: qty 1

## 2022-06-09 NOTE — Progress Notes (Signed)
39 Days Post-Op   Subjective/Chief Complaint: Complains of more pain right side of abd   Objective: Vital signs in last 24 hours: Temp:  [97.8 F (36.6 C)-99.3 F (37.4 C)] 97.8 F (36.6 C) (11/12 0527) Pulse Rate:  [97-118] 113 (11/12 0530) Resp:  [17-22] 20 (11/12 0527) BP: (109-128)/(69-93) 122/69 (11/12 0527) SpO2:  [99 %-100 %] 100 % (11/12 0527) FiO2 (%):  [28 %] 28 % (11/12 0353) Last BM Date : 06/08/22  Intake/Output from previous day: 11/11 0701 - 11/12 0700 In: -  Out: 400 [Urine:400] Intake/Output this shift: No intake/output data recorded.  General appearance: alert and cooperative Resp: clear to auscultation bilaterally Cardio: regular rate and rhythm GI: soft, tender on right. Tube feeds at goal  Lab Results:  Recent Labs    06/07/22 0949  WBC 10.9*  HGB 9.5*  HCT 31.4*  PLT 482*   BMET Recent Labs    06/07/22 0949 06/08/22 0142  NA 136 134*  K 4.5 4.5  CL 109 109  CO2 17* 17*  GLUCOSE 130* 126*  BUN 24* 27*  CREATININE 0.97 0.87  CALCIUM 9.6 9.2   PT/INR No results for input(s): "LABPROT", "INR" in the last 72 hours. ABG No results for input(s): "PHART", "HCO3" in the last 72 hours.  Invalid input(s): "PCO2", "PO2"  Studies/Results: DG CHEST PORT 1 VIEW  Result Date: 06/07/2022 CLINICAL DATA:  Chest pain and shortness of breath. EXAM: PORTABLE CHEST 1 VIEW COMPARISON:  Chest radiograph dated June 01, 2022 FINDINGS: The heart is enlarged. Low lung volumes with mild bibasilar atelectasis. Tracheostomy tube is unchanged. Feeding tube coursing below the diaphragm projecting over the stomach. Thoracic spondylosis. IMPRESSION: 1. Low lung volumes with mild bibasilar atelectasis. 2. Cardiomegaly. Electronically Signed   By: Keane Police D.O.   On: 06/07/2022 22:39    Anti-infectives: Anti-infectives (From admission, onward)    Start     Dose/Rate Route Frequency Ordered Stop   06/04/22 2100  ceFAZolin (ANCEF) IVPB 2g/100 mL premix         2 g 200 mL/hr over 30 Minutes Intravenous Every 8 hours 06/04/22 1103 06/05/22 2037   06/01/22 1515  ceFEPIme (MAXIPIME) 2 g in sodium chloride 0.9 % 100 mL IVPB  Status:  Discontinued        2 g 200 mL/hr over 30 Minutes Intravenous Every 12 hours 06/01/22 1420 06/04/22 1103   05/15/22 1445  ceFAZolin (ANCEF) IVPB 2g/100 mL premix       Note to Pharmacy: To be given in IR   2 g 200 mL/hr over 30 Minutes Intravenous  Once 05/15/22 1352 05/15/22 1616   05/15/22 1230  ceFAZolin (ANCEF) powder 1 g  Status:  Discontinued        1 g Other To Surgery 05/15/22 1140 05/15/22 1239   05/15/22 1230  ceFAZolin (ANCEF) IVPB 2g/100 mL premix        2 g 200 mL/hr over 30 Minutes Intravenous To Radiology 05/15/22 1140 05/15/22 1140   05/15/22 0000  ceFAZolin (ANCEF) IVPB 2g/100 mL premix        2 g 200 mL/hr over 30 Minutes Intravenous To Radiology 05/14/22 1308 05/15/22 0005   05/13/22 2134  meropenem (MERREM) 500 mg in sodium chloride 0.9 % 100 mL IVPB        500 mg 200 mL/hr over 30 Minutes Intravenous Every 24 hours 05/13/22 0815 05/17/22 0700   05/13/22 1000  meropenem (MERREM) 500 mg in sodium chloride 0.9 % 100 mL  IVPB  Status:  Discontinued        500 mg 200 mL/hr over 30 Minutes Intravenous Every 12 hours 05/12/22 0940 05/12/22 0946   05/12/22 2200  meropenem (MERREM) 500 mg in sodium chloride 0.9 % 100 mL IVPB  Status:  Discontinued        500 mg 200 mL/hr over 30 Minutes Intravenous Every 12 hours 05/12/22 0946 05/13/22 0815   05/12/22 2000  meropenem (MERREM) 1 g in sodium chloride 0.9 % 100 mL IVPB  Status:  Discontinued        1 g 200 mL/hr over 30 Minutes Intravenous  Once 05/12/22 0940 05/12/22 0946   05/06/22 1530  meropenem (MERREM) 1 g in sodium chloride 0.9 % 100 mL IVPB  Status:  Discontinued        1 g 200 mL/hr over 30 Minutes Intravenous Every 8 hours 05/06/22 1438 05/12/22 0940   05/04/22 1000  vancomycin (VANCOCIN) IVPB 1000 mg/200 mL premix  Status:  Discontinued         1,000 mg 200 mL/hr over 60 Minutes Intravenous Every 24 hours 05/03/22 0802 05/08/22 0913   05/03/22 0845  vancomycin (VANCOREADY) IVPB 2000 mg/400 mL        2,000 mg 200 mL/hr over 120 Minutes Intravenous  Once 05/03/22 0751 05/03/22 1114   05/01/22 1800  piperacillin-tazobactam (ZOSYN) IVPB 3.375 g  Status:  Discontinued        3.375 g 12.5 mL/hr over 240 Minutes Intravenous Every 6 hours 05/01/22 1426 05/01/22 1427   05/01/22 1515  piperacillin-tazobactam (ZOSYN) IVPB 3.375 g  Status:  Discontinued        3.375 g 100 mL/hr over 30 Minutes Intravenous Every 6 hours 05/01/22 1428 05/06/22 1438   04/30/22 1400  piperacillin-tazobactam (ZOSYN) IVPB 2.25 g  Status:  Discontinued        2.25 g 100 mL/hr over 30 Minutes Intravenous Every 8 hours 04/30/22 1325 05/01/22 1426   04/29/22 1530  piperacillin-tazobactam (ZOSYN) IVPB 3.375 g  Status:  Discontinued        3.375 g 12.5 mL/hr over 240 Minutes Intravenous Every 8 hours 04/29/22 1433 04/30/22 1325   04/29/22 1145  metroNIDAZOLE (FLAGYL) IVPB 500 mg  Status:  Discontinued        500 mg 100 mL/hr over 60 Minutes Intravenous Every 12 hours 04/29/22 1047 04/29/22 1433   04/29/22 1145  vancomycin (VANCOCIN) IVPB 1000 mg/200 mL premix  Status:  Discontinued        1,000 mg 200 mL/hr over 60 Minutes Intravenous  Once 04/29/22 1047 04/29/22 1055   04/29/22 1145  vancomycin (VANCOREADY) IVPB 1500 mg/300 mL  Status:  Discontinued        1,500 mg 150 mL/hr over 120 Minutes Intravenous Every 24 hours 04/29/22 1055 04/29/22 1357   04/29/22 1045  ceFEPIme (MAXIPIME) 2 g in sodium chloride 0.9 % 100 mL IVPB  Status:  Discontinued        2 g 200 mL/hr over 30 Minutes Intravenous Every 8 hours 04/29/22 0954 04/29/22 1433   04/23/22 0730  ceFAZolin (ANCEF) IVPB 2g/100 mL premix        2 g 200 mL/hr over 30 Minutes Intravenous On call to O.R. 04/23/22 0630 04/23/22 0828       Assessment/Plan: s/p Procedure(s): Coronary/Graft Acute MI  Revascularization (N/A) LEFT HEART CATH AND CORONARY ANGIOGRAPHY (N/A) RIGHT/LEFT HEART CATH AND CORONARY ANGIOGRAPHY (N/A) Advance diet Wbc up a little 2 days ago. Will repeat since she is  having more abd pain Ms. Neglia is s/p robotic incisional hernia repair on 04/23/22 - Tube feeds to goal - Pain medication prn - Okay for diet as deemed fit by speech therapy team   Acute metabolic encephalopathy superimposed on hx bipolar disorder, schizophrenia   ICU delirium- improved now off all IV infusions. Acute biventricular HFrEF (Etiology sepsis, stress induced CM. Has clean coronaries) Cardiogenic shock resolved, EF improved.  Acute hypoxic respiratory failure Aspiration PNA Pulm edema   RLL VAP  AKI with oliguria; no baseline CKD - appears to have some renal recovery, but Cr up some yesterday but good UOP Anemia, acute on chronic  DM2 with hyperglycemia  Severe protein calorie malnutrition  Pressure injury lip - improving  Deconditioning    Appreciate [facility] care with multiple issues   Needs therapy and recouperation     Dispo - Medically stable for discharge to SNF.  Difficult placement.  LOS: 46 days    Patricia [name] 06/09/2022

## 2022-06-09 NOTE — Progress Notes (Signed)
   06/09/22 0032  Vitals  Temp 98.4 F (36.9 C)  Temp Source Oral  BP 114/80  MAP (mmHg) 91  BP Location Left Arm  BP Method Automatic  Patient Position (if appropriate) Lying  Pulse Rate (!) 117  Pulse Rate Source Dinamap  Resp 18  Level of Consciousness  Level of Consciousness Alert  MEWS COLOR  MEWS Score Color Yellow  Oxygen Therapy  SpO2 100 %  O2 Device Tracheostomy Collar  O2 Flow Rate (L/min) 5 L/min  Patient Activity (if Appropriate) In bed  Pulse Oximetry Type Continuous  Pain Assessment  Pain Scale 0-10  Pain Score Asleep  Critical Care Pain Observation Tool (CPOT)  Facial Expression 0  Body Movements 0  Muscle Tension 0  Complaints & Interventions  Complains of Other (Comment) (Denies CP or SOB. No distress noted. Resipirations easy, even, and unlabored. PRN Dilaudid given for c/o abd pain. Compazine for nausea. Relief noted.)  Interventions Reposition;Relaxation  Neuro symptoms relieved by Rest  Itching intervention Benadryl  Nausea relieved by Antiemetic  Patients response to intervention Effective  PCA/Epidural/Spinal Assessment  Respiratory Pattern Regular;Unlabored  MEWS Score  MEWS Temp 0  MEWS Systolic 0  MEWS Pulse 2  MEWS RR 0  MEWS LOC 0  MEWS Score 2   Will continue to monitor.

## 2022-06-09 NOTE — Progress Notes (Signed)
   06/09/22 0249  Vitals  Temp 98.4 F (36.9 C)  Temp Source Axillary  BP 109/78  MAP (mmHg) 89  BP Location Left Arm  BP Method Automatic  Patient Position (if appropriate) Lying  Pulse Rate (!) 115  Pulse Rate Source Dinamap  Resp 20  Level of Consciousness  Level of Consciousness Alert  MEWS COLOR  MEWS Score Color Yellow  Oxygen Therapy  SpO2 100 %  O2 Device Tracheostomy Collar  O2 Flow Rate (L/min) 5 L/min  Patient Activity (if Appropriate) In bed  Pain Assessment  Pain Scale 0-10  Pain Score 0  Complaints & Interventions  Nausea relieved by Antiemetic  MEWS Score  MEWS Temp 0  MEWS Systolic 0  MEWS Pulse 2  MEWS RR 0  MEWS LOC 0  MEWS Score 2  Provider Notification  Provider Name/Title Annye English  Date Provider Notified 06/09/22  Time Provider Notified 0251  Method of Notification Page  Notification Reason Other (Comment) (Vomited large amount of tube feeding. Tube feeding on hold. VSS, pulse up to 115 d/t vomiting and discomfort. Denies CP, palpitations, or SOB. Compazine given - relief noted.)  Provider response Other (Comment) (Hold tube feeding and have day team follow up with rounding team.)  Date of Provider Response 06/09/22  Time of Provider Response [phone]

## 2022-06-09 NOTE — Progress Notes (Signed)
PROGRESS NOTE    Patricia [name]  IRS:[id number] DOB: [birth year] DOA: 04/23/2022 PCP: [name], MD   Brief Narrative:   51 year old with history of DM2, depression, migraine, asthma, schizophrenia, HTN, HLD presented to the hospital for exertional hernia, robotic hernia repair on 04/23/2022 with mesh who developed postop ileus and is on TNA.  Also patient developed vomiting and hypotension after NG tube came out on 04/29/2022 with concerns of pneumonia which was treated with antibiotics and IV fluids.  During that time patient also required Levophed and eventually intubated on 10/3 for worsening metabolic acidosis complicated by acute kidney injury.  Echocardiogram showed depressed EF of 30-35% with elevated troponin.  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  Patient was seen by CHF team started on dopamine and CRRT as well.  Patient was again reintubated due to neuromuscular weakness on 10/8.  Eventually extubated on 10/14 to BiPAP and trach was placed due to weakness.  Tube feeds held due to nausea vomiting on 10/30.  Hospital course complicated by hypernatremia, she developed AKI, requiring brief course of HD, renal function has recovered.  Subjective:  -Had some chest pain last night, tachycardic, 1 episode of vomiting earlier and some upper abdominal discomfort today  Assessment and Plan:  Septic shock ([place]) secondary to aspiration pneumonia, right lower lobe Acute respiratory failure with prolonged mechanical ventilation status post tracheostomy -Trach management by pulmonary team. - Bronchodilators as needed, supportive care.  Out of bed to chair -toelrating PMV -Followed by another aspiration event, started empirically on cefepime, sputum culture growing Klebsiella, then ancef, completed Abx 11/8 -Patient failed MBS on Friday, remains n.p.o., only ice chips, feeding via cortrak only SLP following for weekly swallow eval's, if fails again may need to  consider PEG tube placement -await SLP Re-eval-hopefully on Monday  Status post hernia repair complicated by postop ileus/inflammatory enteritis Intermittent nausea vomiting -Management by general surgery.  She is working with speech and swallow.  Remains n.p.o., PMV.  Tube feeds, SLP following for dysphagia progression -Management per primary surgical team -Borderline abdominal symptoms with leukocytosis, may need repeat imaging-defer to [facility]  Chest pain, tachycardia -Could be secondary to above, EKG with T wave inversion and Q in lead III, will check CTA chest to rule out PE, remains on heparin for DVT prophylaxis however is at high risk of VTE with prolonged hospitalization, she has contrast allergy, will need pretreatment  Hypernatremia - resolved, if sodium starts to increase then will start on D5W , as surgery would like to avoid excessive free water via cortrak, as it usually causes nausea, vomiting and abdominal pain .  AKI -Likely due to ATN in the setting of cardiogenic/septic shock, required CRRT 10/410/14, first HD on 10/16, renal function has recovered.  Right knee pain - Unclear etiology, no evidence of trauma.  Negative x-ray  Acute kidney injury, improving -Initially required CRRT and HD.  Now improving without dialysis  Biventricular failure, reduced EF - Last seen by CHF team 10/16.  -Now stable and remains euvolemic  -plans to follow-up outpatient. LHC shows clean coronaries.  -Continue metoprolol  Sinus tachycardia; improved.  - Now on metoprolol 50 mg twice daily.  IV as needed  Anemia of chronic disease - From underlying multiple medical issues.  Transfuse as necessary and attempt to keep hemoglobin greater than 8  Essential hypertension -Currently on metoprolol twice daily  Chronic low back pain -As needed pain control  Morbid obesity (HCC) -Body mass index is 41.37 kg/m.Marland Kitchen  Currently on tube feeds -Follow-up outpatient, weight loss diet and  exercise  BIPOLAR DISORDER UNSPECIFIED -On Klonopin.  Should slowly resume home meds as appropriate  Diabetes mellitus type 2 in obese (HCC) -Sliding scale and Accu-Cheks. -stable  Pressure area on the lip in the setting of ET tube placement which was not present prior to admission.  Routine wound care.  PT-CIR   DVT prophylaxis: Subcu heparin Code Status: Full code Family Communication:  none at bedside  Final dispo per general surgery  Nutritional status    Signs/Symptoms: NPO status (with need for MBS per SLP)  Interventions: Tube feeding, Prostat, MVI  Body mass index is 28 kg/m.      Examination:  Gen: AAOx3 no distress,  HEENT: no JVD, cortrak and trach collar CVS: S1-S2, regular rhythm Lungs: Few conducted upper airway sounds otherwise clear Abdomen: Soft, nontender, bowel sounds present Extremities: No edema Skin: no new rashes on exposed skin    Objective: Vitals:   06/09/22 0527 06/09/22 0530 06/09/22 0825 06/09/22 0918  BP: 122/69  (!) 120/92   Pulse: (!) 116 (!) 113 (!) 112 (!) 115  Resp: '20  20 18  '$ Temp: 97.8 F (36.6 C)  98 F (36.7 C)   TempSrc: Oral  Oral   SpO2: 100%  100% 100%  Weight:      Height:        Intake/Output Summary (Last 24 hours) at 06/09/2022 1008 Last data filed at 06/08/2022 2103 Gross per 24 hour  Intake --  Output 400 ml  Net -400 ml    Filed Weights   06/03/22 0500 06/04/22 0500 06/05/22 0500  Weight: 80.4 kg 78.7 kg 74 kg     Data Reviewed:   CBC: Recent Labs  Lab 06/03/22 0410 06/04/22 0529 06/05/22 0638 06/07/22 0949 06/09/22 0726  WBC 7.7 7.6 8.2 10.9* 16.4*  HGB 7.8* 8.7* 8.6* 9.5* 9.6*  HCT 26.2* 29.0* 28.7* 31.4* 31.1*  MCV 91.6 91.8 90.8 90.5 90.4  PLT 448* 461* 460* 482* 268*   Basic Metabolic Panel: Recent Labs  Lab 06/02/22 1040 06/03/22 0410 06/04/22 0529 06/05/22 0638 06/07/22 0949 06/08/22 0142 06/09/22 0729  NA 144 141 143 139 136 134* 137  K 3.8 3.6 4.1 4.1 4.5 4.5  5.1  CL 117* 114* 111 114* 109 109 107  CO2 19* 20* 18* 17* 17* 17* 18*  GLUCOSE 152* 125* 132* 139* 130* 126* 128*  BUN 34* 28* 29* 27* 24* 27* 25*  CREATININE 1.54* 1.34* 1.21* 1.10* 0.97 0.87 1.06*  CALCIUM 8.8* 8.9 9.6 9.3 9.6 9.2 9.6  MG 1.8 1.8 1.9  --   --   --   --   PHOS 3.8 3.9 4.1 4.6 4.3 4.7* 5.1*   GFR: Estimated Creatinine Clearance: 61.2 mL/min (A) (by C-G formula based on SCr of 1.06 mg/dL (H)). Liver Function Tests: Recent Labs  Lab 06/04/22 0529 06/05/22 3419 06/07/22 0949 06/08/22 0142 06/09/22 0729  ALBUMIN 2.3* 2.4* 2.6* 2.5* 2.6*   No results for input(s): "LIPASE", "AMYLASE" in the last 168 hours. No results for input(s): "AMMONIA" in the last 168 hours. Coagulation Profile: No results for input(s): "INR", "PROTIME" in the last 168 hours. Cardiac Enzymes: No results for input(s): "CKTOTAL", "CKMB", "CKMBINDEX", "TROPONINI" in the last 168 hours. BNP (last 3 results) No results for input(s): "PROBNP" in the last 8760 hours. HbA1C: No results for input(s): "HGBA1C" in the last 72 hours. CBG: Recent Labs  Lab 06/08/22 0754 06/08/22 1240 06/08/22 1803 06/09/22  0003 06/09/22 0604  GLUCAP 130* 116* 118* 135* 98   Lipid Profile: No results for input(s): "CHOL", "HDL", "LDLCALC", "TRIG", "CHOLHDL", "LDLDIRECT" in the last 72 hours. Thyroid Function Tests: No results for input(s): "TSH", "T4TOTAL", "FREET4", "T3FREE", "THYROIDAB" in the last 72 hours. Anemia Panel: No results for input(s): "VITAMINB12", "FOLATE", "FERRITIN", "TIBC", "IRON", "RETICCTPCT" in the last 72 hours. Sepsis Labs: No results for input(s): "PROCALCITON", "LATICACIDVEN" in the last 168 hours.  Recent Results (from the past 240 hour(s))  Culture, Respiratory w Gram Stain     Status: None   Collection Time: 06/01/22  8:39 AM   Specimen: Tracheal Aspirate; Respiratory  Result Value Ref Range Status   Specimen Description TRACHEAL ASPIRATE  Final   Special Requests NONE  Final    Gram Stain   Final    ABUNDANT WBC PRESENT, PREDOMINANTLY PMN MODERATE GRAM POSITIVE COCCI IN CHAINS IN PAIRS    Culture   Final    RARE KLEBSIELLA PNEUMONIAE WITHIN NORMAL RESPIRATORY FLORA Performed at [facility], [place] [address] [place], [place] [address]    Report Status 06/03/2022 FINAL  Final   Organism ID, Bacteria KLEBSIELLA PNEUMONIAE  Final      Susceptibility   Klebsiella pneumoniae - MIC*    AMPICILLIN >=32 RESISTANT Resistant     CEFAZOLIN <=4 SENSITIVE Sensitive     CEFEPIME <=0.12 SENSITIVE Sensitive     CEFTAZIDIME <=1 SENSITIVE Sensitive     CEFTRIAXONE <=0.25 SENSITIVE Sensitive     CIPROFLOXACIN <=0.25 SENSITIVE Sensitive     GENTAMICIN <=1 SENSITIVE Sensitive     IMIPENEM <=0.25 SENSITIVE Sensitive     TRIMETH/SULFA >=320 RESISTANT Resistant     AMPICILLIN/SULBACTAM 8 SENSITIVE Sensitive     PIP/TAZO <=4 SENSITIVE Sensitive     * RARE KLEBSIELLA PNEUMONIAE     Scheduled Meds:  acetaminophen (TYLENOL) oral liquid 160 mg/5 mL  650 mg Per Tube Q4H   famotidine  20 mg Per Tube BID   feeding supplement (PROSource TF20)  60 mL Per Tube Daily   fiber  1 packet Per Tube TID   gabapentin  200 mg Per Tube Q8H   heparin injection (subcutaneous)  5,000 Units Subcutaneous Q8H   insulin aspart  0-20 Units Subcutaneous Q6H   insulin aspart  4 Units Subcutaneous Q6H   metoprolol tartrate  50 mg Per Tube BID   multivitamin with minerals  1 tablet Per Tube Daily   nystatin   Topical TID   mouth rinse  15 mL Mouth Rinse 4 times per day   polyethylene glycol  17 g Oral Daily   QUEtiapine  25 mg Per Tube QHS   senna  1 tablet Per Tube Daily   Continuous Infusions:  feeding supplement (PEPTAMEN 1.5 CAL)     methocarbamol (ROBAXIN) IV 500 mg (06/08/22 2149)     LOS: 46 days      Domenic Polite, MD [facility]  If 7PM-7AM, please contact night-coverage  06/09/2022, 10:08 AM

## 2022-06-10 ENCOUNTER — Inpatient Hospital Stay (HOSPITAL_COMMUNITY): Payer: Medicaid Other

## 2022-06-10 DIAGNOSIS — R6521 Severe sepsis with septic shock: Secondary | ICD-10-CM | POA: Diagnosis not present

## 2022-06-10 DIAGNOSIS — Z930 Tracheostomy status: Secondary | ICD-10-CM | POA: Diagnosis not present

## 2022-06-10 DIAGNOSIS — A419 Sepsis, unspecified organism: Secondary | ICD-10-CM | POA: Diagnosis not present

## 2022-06-10 LAB — RENAL FUNCTION PANEL
Albumin: 2.6 g/dL — ABNORMAL LOW (ref 3.5–5.0)
Anion gap: 12 (ref 5–15)
BUN: 26 mg/dL — ABNORMAL HIGH (ref 6–20)
CO2: 19 mmol/L — ABNORMAL LOW (ref 22–32)
Calcium: 9.6 mg/dL (ref 8.9–10.3)
Chloride: 104 mmol/L (ref 98–111)
Creatinine, Ser: 0.94 mg/dL (ref 0.44–1.00)
GFR, Estimated: >60 mL/min (ref >60)
Glucose, Bld: 111 mg/dL — ABNORMAL HIGH (ref 70–99)
Phosphorus: 4.6 mg/dL (ref 2.5–4.6)
Potassium: 4.4 mmol/L (ref 3.5–5.1)
Sodium: 135 mmol/L (ref 135–145)

## 2022-06-10 LAB — CBC
HCT: 28.5 % — ABNORMAL LOW (ref 36.0–46.0)
Hemoglobin: 9 g/dL — ABNORMAL LOW (ref 12.0–15.0)
MCH: 27.5 pg (ref 26.0–34.0)
MCHC: 31.6 g/dL (ref 30.0–36.0)
MCV: 87.2 fL (ref 80.0–100.0)
Platelets: 424 10*3/uL — ABNORMAL HIGH (ref 150–400)
RBC: 3.27 MIL/uL — ABNORMAL LOW (ref 3.87–5.11)
RDW: 17.2 % — ABNORMAL HIGH (ref 11.5–15.5)
WBC: 13.1 10*3/uL — ABNORMAL HIGH (ref 4.0–10.5)
nRBC: 0 % (ref 0.0–0.2)

## 2022-06-10 LAB — LIPASE, BLOOD: Lipase: 104 U/L — ABNORMAL HIGH (ref 11–51)

## 2022-06-10 LAB — HEPATIC FUNCTION PANEL
ALT: 29 U/L (ref 0–44)
AST: 28 U/L (ref 15–41)
Albumin: 2.6 g/dL — ABNORMAL LOW (ref 3.5–5.0)
Alkaline Phosphatase: 104 U/L (ref 38–126)
Bilirubin, Direct: <0.1 mg/dL (ref 0.0–0.2)
Total Bilirubin: 0.2 mg/dL — ABNORMAL LOW (ref 0.3–1.2)
Total Protein: 8 g/dL (ref 6.5–8.1)

## 2022-06-10 LAB — GLUCOSE, CAPILLARY
Glucose-Capillary: 107 mg/dL — ABNORMAL HIGH (ref 70–99)
Glucose-Capillary: 108 mg/dL — ABNORMAL HIGH (ref 70–99)
Glucose-Capillary: 111 mg/dL — ABNORMAL HIGH (ref 70–99)
Glucose-Capillary: 118 mg/dL — ABNORMAL HIGH (ref 70–99)
Glucose-Capillary: 125 mg/dL — ABNORMAL HIGH (ref 70–99)
Glucose-Capillary: 94 mg/dL (ref 70–99)

## 2022-06-10 NOTE — Progress Notes (Signed)
NAME:  Patricia [name], MRN:  [id number], DOB:  [birth year], LOS: 59 ADMISSION DATE:  04/23/2022, CONSULTATION DATE:  04/29/2022 REFERRING MD:  Felicie Morn, MD, CHIEF COMPLAINT:   Small bowel obstruction, aspiration event, sepsis, respiratory failure    History of Present Illness:  Patricia [name] with above hx and nuc study in 2015 neg for ischemia done for chest pain and echo with EF 60-65% presented to hospital for excisional hernia repair (robotic hernia repair 04/23/22) with mesh, ileus post op on TNA. Developed vomiting and hypotension after NG came out on 04/29/22 and PNA on CXR, was tachycardic and BP improved with IV fluids. DX with sepsis and ABX started. At one point prolonged Qtc. Pt admitted to ICU on 10/2.  Was placed on levophed. Early AM on 10/3 was intubated for worsening metabolic/lactic acidosis. She had AKI as well. Crt was 6.01, her normal 0.9. Today Cr is 4 on CRRT.  Pertinent  Medical History  DM2 Depression OSA Migraines  Asthma Obesity Schizophrenia HTN HLD  Significant Hospital Events: Including procedures, antibiotic start and stop dates in addition to other pertinent events   9/26 Robotic hernia repair complicated by abd adhesions.  10/2 CT + for developing SBO and bilat lower lobe opacities most likely aspiration PNA. Progressive decompensation requiring transfer to ICU 10/2 PCCM consulted for respiratory failure & shock 10\3 Intubated for resp failure. CVL and arterial line placed. 04/30/22: EF 30-35%, RV moderately reduced, mild to moderate MR, IVC collapsed throughout respiratory cycle 10\4 EKG changes/trop increased--> and taken to cath lab - No obstructive CAD, but in decompensated heart failure, Bedside echo with EF 15%, new WMA, moderate RV dysfunction, trivial pericardial effusion.underwent emergent L/RHC. Normal coronaries, RA mean 13, PA mean 29, LVEDP 16, PCWP 19, CO/CI 3.96/1.87   returned to CICU post-cath. Right heart cath placed right fem.  Right IJ HD cath placed.  10/5 advanced HF team following, shock supported on norepi/vasopressin and Dobutamine. On CRRT.  10/6 weaning Norepi. Starting to wean fent. Needing restraints.  10/8: Reintubated due to neuromuscular weakness.  Bedside echo demonstrating improved RV function, LV function remained depressed 10/9 CT abdomen pelvis.suggesting wall thickening throughout the proximal and mid small bowel consistent with possible infectious or inflammatory enteritis there is also bibasilar airspace disease which could reflect pneumonia.  Antibiotic coverage widened.  Zosyn changed to meropenem.  Tolerating trickle feeds still on TPN 10/10 stopped stress dose steroids.  Resumed subcutaneous heparin. 10/13 albumin for coox decrease. Weaning vent . Failed SBT. At goal TF. 10/14 extubated to bipap failed due to weakness, trached 10/15 trial off CRRT, lasix 10/18 RIJ permcath 10/22 transfer 4N as a lateral after aspiration event briefly requiring mechanical ventilation 10/25 transfer to [place] progressive  11/1 [place] removed 11/13 swallow eval  Interim History / Subjective:  She is doing so much better. Strength better. Speech stronger. Tolerating PMV Objective   Blood pressure (!) 139/94, pulse (!) 110, temperature 98.2 F (36.8 C), temperature source Oral, resp. rate (!) 23, height '5\' 4"'$  (1.626 m), weight 80.4 kg, last menstrual period 07/07/2014, SpO2 96 %.    FiO2 (%):  [28 %] 28 %   Intake/Output Summary (Last 24 hours) at 06/03/2022 1405 Last data filed at 06/03/2022 1055 Gross per 24 hour  Intake 2515.92 ml  Output 1100 ml  Net 1415.92 ml    Filed Weights   05/31/22 0515 06/01/22 0500 06/03/22 0500  Weight: 81.6 kg 77.8 kg 80.4 kg    Examination:  General resting  in bed. No distress HENT NCAT trach is midline. Phonation strong w/ PMV in place and cuff deflated.  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 Has  AKI due to septic ATN, no renal recovery S/p hernia repair &  LOA inflammatory enteritis  Anemia, acute on chronic  Acute urinary retention DM2 with hyperglycemia  Severe protein calorie malnutrition  Morbid obesity Pressure injury- lip in the setting of ET tube: Not present on admission  Pulm problem list: Acute respiratory failure with hypoxia Tracheostomy dependence  dysphagia   Discussion: Completed abx for aspiration. She has improved 10 fold since I saw her last week. Her phonation quality is better, cough mechanics excellent, secretion management not a issue. I think we can start working towards decannulation   Plan: Swallow eval today Have 4 cuffless trach to bedside tomorrow ->I will down size After downsize will start capping trials in effort to assess for readiness to have trach removed.   Erick Colace ACNP-BC [place] Pager # [phone] OR # [phone] if no answer   Best Practice (right click and "Reselect all SmartList Selections" daily)

## 2022-06-10 NOTE — Progress Notes (Signed)
Speech Pathology:  Pt is scheduled for repeat MBS today at 2 pm.  Estill Bamberg L. Tivis Ringer, MA CCC/SLP Clinical Specialist - [place] Office number [phone]

## 2022-06-10 NOTE — Progress Notes (Signed)
Physical Therapy Treatment Patient Details Name: Patricia [name] MRN: [id number] DOB: 05-28-1971 Today's Date: 06/10/2022   History of Present Illness 51 yo female admitted 04/23/22 for excisional hernia repair with mesh. Course complicated by post-op ileus, tachycardia, PNA, sepsis. Transfer to ICU 10/2 on levophed. Worsening acidosis 10/3 requiring intubation. Cardiac cath 10/4 with EF 15%. Pt with AKI; on CRRT 10/5-10/14. Failed extubation 10/8 and 10/14; s/p trach 10/14. PMH includes depression, OSA, asthma, schizophrenia, DM, insomnia.    PT Comments    Pt progressing well towards all goals. Pt very motivated to progress OOB mobility. Pt does well with sara lift allowing pt to work towards increasing standing tolerance and promotion of LE weightbearing. Pt remains severely deconditioned by prolonged hospital stay. Pt to continue to benefit from SNF upon d/c to allow for increased time to regain functional mobility. Acute PT to cont to follow.   Recommendations for follow up therapy are one component of a multi-disciplinary discharge planning process, led by the attending physician.  Recommendations may be updated based on patient status, additional functional criteria and insurance authorization.  Follow Up Recommendations  Skilled nursing-short term rehab (<3 hours/day) Can patient physically be transported by private vehicle: No   Assistance Recommended at Discharge Frequent or constant Supervision/Assistance  Patient can return home with the following Two people to help with walking and/or transfers;Two people to help with bathing/dressing/bathroom;Assistance with cooking/housework;Assistance with feeding;Assist for transportation;Help with stairs or ramp for entrance   Equipment Recommendations  Hospital bed;Wheelchair (measurements PT);Wheelchair cushion (measurements PT);Other (comment)    Recommendations for Other Services       Precautions / Restrictions  Precautions Precautions: Fall Precaution Comments: trach collar, PMSV (to worn all waking hours with cuff deflated, cortrak Restrictions Weight Bearing Restrictions: No     Mobility  Bed Mobility Overal bed mobility: Needs Assistance Bed Mobility: Rolling, Sidelying to Sit Rolling: Mod assist Sidelying to sit: HOB elevated, Mod assist       General bed mobility comments: modA to for trunk elevation and to safely lower LEs down off EOB    Transfers Overall transfer level: Needs assistance Equipment used: Ambulation equipment used Transfers: Sit to/from Stand Sit to Stand: Max assist, +2 physical assistance           General transfer comment: Pt with preference for [employer] , improved ability to engage LEs as compared to stedy. completed 3 sit to stands with focus on terminal knee extension and glut squeezes for upright posture, transfered pt to chair using sara lift Transfer via Lift Equipment: Marketing executive  Ambulation/Gait               General Gait Details: unable   Marine scientist [place] (Stroke Patients Only)       Balance Overall balance assessment: Needs assistance Sitting-balance support: No upper extremity supported, Feet supported Sitting balance-Leahy Scale: Good Sitting balance - Comments: Able sit EOB and wash UB without LOB; occassionally having to supports self with one UE at a time   Standing balance support: Bilateral upper extremity supported, Reliant on assistive device for balance Standing balance-Leahy Scale: Zero Standing balance comment: utilized sara lift, reliant on UE support                            Cognition Arousal/Alertness: Awake/alert Behavior During Therapy: WFL for tasks assessed/performed Overall Cognitive Status:  Within Functional Limits for tasks assessed                                 General Comments: able to follow all commands, good effort from  functional stand point and trying to vocalize with PMSV        Exercises General Exercises - Lower Extremity Ankle Circles/Pumps: Both, 10 reps, Seated Long Arc Quad: Both, 10 reps, Seated Hip ABduction/ADduction: Both, 5 reps, AROM    General Comments General comments (skin integrity, edema, etc.): VSS, pt was soiled upon arrival, pt dependent for hygiene      Pertinent Vitals/Pain Pain Assessment Pain Assessment: No/denies pain    Home Living                          Prior Function            PT Goals (current goals can now be found in the care plan section) Acute Rehab PT Goals PT Goal Formulation: With patient Time For Goal Achievement: 06/19/22 Potential to Achieve Goals: Fair Progress towards PT goals: Progressing toward goals    Frequency    Min 3X/week      PT Plan Current plan remains appropriate    Co-evaluation              AM-PAC PT "6 Clicks" Mobility   Outcome Measure  Help needed turning from your back to your side while in a flat bed without using bedrails?: A Lot Help needed moving from lying on your back to sitting on the side of a flat bed without using bedrails?: Total Help needed moving to and from a bed to a chair (including a wheelchair)?: Total Help needed standing up from a chair using your arms (e.g., wheelchair or bedside chair)?: Total Help needed to walk in hospital room?: Total Help needed climbing 3-5 steps with a railing? : Total 6 Click Score: 7    End of Session Equipment Utilized During Treatment: Gait belt Activity Tolerance: Patient tolerated treatment well Patient left: in chair;with call bell/phone within reach;with chair alarm set Nurse Communication: Mobility status (needs a new purwick and wanted ice chips) PT Visit Diagnosis: Muscle weakness (generalized) (M62.81);Difficulty in walking, not elsewhere classified (R26.2);Other abnormalities of gait and mobility (R26.89);Unsteadiness on feet (R26.81)      Time: 4403-4742 PT Time Calculation (min) (ACUTE ONLY): 36 min  Charges:  $Therapeutic Exercise: 8-22 mins $Therapeutic Activity: 8-22 mins                     Kittie Plater, PT, DPT Acute Rehabilitation Services Secure chat preferred Office #: [phone]    Berline Lopes 06/10/2022, 2:10 PM

## 2022-06-10 NOTE — Progress Notes (Signed)
40 Days Post-Op   Subjective/Chief Complaint: Vomited over weekend.  WBC elevated   Objective: Vital signs in last 24 hours: Temp:  [97.6 F (36.4 C)-98 F (36.7 C)] 97.7 F (36.5 C) (11/12 2012) Pulse Rate:  [97-116] 97 (11/13 0333) Resp:  [17-20] 18 (11/13 0333) BP: (118-125)/(76-92) 125/88 (11/12 2012) SpO2:  [98 %-100 %] 100 % (11/13 0333) FiO2 (%):  [28 %] 28 % (11/13 0333) Last BM Date : 06/08/22  Intake/Output from previous day: 11/12 0701 - 11/13 0700 In: -  Out: 1100 [Urine:1100] Intake/Output this shift: No intake/output data recorded.  General appearance: alert and cooperative Resp: clear to auscultation bilaterally Cardio: regular rate and rhythm GI: soft, tender on right. Tube feeds at goal  Lab Results:  Recent Labs    06/09/22 0726 06/10/22 0345  WBC 16.4* 13.1*  HGB 9.6* 9.0*  HCT 31.1* 28.5*  PLT 418* 424*    BMET Recent Labs    06/09/22 0729 06/10/22 0345  NA 137 135  K 5.1 4.4  CL 107 104  CO2 18* 19*  GLUCOSE 128* 111*  BUN 25* 26*  CREATININE 1.06* 0.94  CALCIUM 9.6 9.6    PT/INR No results for input(s): "LABPROT", "INR" in the last 72 hours. ABG No results for input(s): "PHART", "HCO3" in the last 72 hours.  Invalid input(s): "PCO2", "PO2"  Studies/Results: CT Angio Chest Pulmonary Embolism (PE) W or WO Contrast  Result Date: 06/09/2022 CLINICAL DATA:  Chest pain, shortness of breath. EXAM: CT ANGIOGRAPHY CHEST WITH CONTRAST TECHNIQUE: Multidetector CT imaging of the chest was performed using the standard protocol during bolus administration of intravenous contrast. Multiplanar CT image reconstructions and MIPs were obtained to evaluate the vascular anatomy. RADIATION DOSE REDUCTION: This exam was performed according to the departmental dose-optimization program which includes automated exposure control, adjustment of the mA and/or kV according to patient size and/or use of iterative reconstruction technique. CONTRAST:  51m  OMNIPAQUE IOHEXOL 350 MG/ML SOLN COMPARISON:  04/30/2022. FINDINGS: Cardiovascular: Pulmonary arteries are relatively well opacified. There is no evidence of a pulmonary embolism. Heart is mildly enlarged. No pericardial effusion. Coronary arteries are unremarkable. Great vessels are normal in caliber. No aortic dissection or atherosclerosis. Mediastinum/Nodes: Well-positioned tracheostomy tube. No neck base, mediastinal or hilar masses. No enlarged lymph nodes. Nasal/orogastric tube extends well below the diaphragm into the stomach. Esophagus unremarkable. Normal trachea. Lungs/Pleura: Dependent opacities in both lower lobes, right greater than left, consistent with atelectasis. Remainder of the lungs is clear. No pleural effusion or pneumothorax. Upper Abdomen: Visualized upper abdominal structures are unremarkable. Musculoskeletal: No fracture or acute finding. No bone lesion. No chest wall mass. Review of the MIP images confirms the above findings. IMPRESSION: 1. No evidence of a pulmonary embolism. 2. No acute findings. 3. Right greater than left lower lobe dependent lung opacity consistent with atelectasis, similar to the prior CT. No convincing pneumonia and no pulmonary edema. 4. Well-positioned tracheostomy tube. Electronically Signed   By: DLajean ManesM.D.   On: 06/09/2022 15:24    Anti-infectives: Anti-infectives (From admission, onward)    Start     Dose/Rate Route Frequency Ordered Stop   06/04/22 2100  ceFAZolin (ANCEF) IVPB 2g/100 mL premix        2 g 200 mL/hr over 30 Minutes Intravenous Every 8 hours 06/04/22 1103 06/05/22 2037   06/01/22 1515  ceFEPIme (MAXIPIME) 2 g in sodium chloride 0.9 % 100 mL IVPB  Status:  Discontinued        2  g 200 mL/hr over 30 Minutes Intravenous Every 12 hours 06/01/22 1420 06/04/22 1103   05/15/22 1445  ceFAZolin (ANCEF) IVPB 2g/100 mL premix       Note to Pharmacy: To be given in IR   2 g 200 mL/hr over 30 Minutes Intravenous  Once 05/15/22 1352  05/15/22 1616   05/15/22 1230  ceFAZolin (ANCEF) powder 1 g  Status:  Discontinued        1 g Other To Surgery 05/15/22 1140 05/15/22 1239   05/15/22 1230  ceFAZolin (ANCEF) IVPB 2g/100 mL premix        2 g 200 mL/hr over 30 Minutes Intravenous To Radiology 05/15/22 1140 05/15/22 1140   05/15/22 0000  ceFAZolin (ANCEF) IVPB 2g/100 mL premix        2 g 200 mL/hr over 30 Minutes Intravenous To Radiology 05/14/22 1308 05/15/22 0005   05/13/22 2134  meropenem (MERREM) 500 mg in sodium chloride 0.9 % 100 mL IVPB        500 mg 200 mL/hr over 30 Minutes Intravenous Every 24 hours 05/13/22 0815 05/17/22 0700   05/13/22 1000  meropenem (MERREM) 500 mg in sodium chloride 0.9 % 100 mL IVPB  Status:  Discontinued        500 mg 200 mL/hr over 30 Minutes Intravenous Every 12 hours 05/12/22 0940 05/12/22 0946   05/12/22 2200  meropenem (MERREM) 500 mg in sodium chloride 0.9 % 100 mL IVPB  Status:  Discontinued        500 mg 200 mL/hr over 30 Minutes Intravenous Every 12 hours 05/12/22 0946 05/13/22 0815   05/12/22 2000  meropenem (MERREM) 1 g in sodium chloride 0.9 % 100 mL IVPB  Status:  Discontinued        1 g 200 mL/hr over 30 Minutes Intravenous  Once 05/12/22 0940 05/12/22 0946   05/06/22 1530  meropenem (MERREM) 1 g in sodium chloride 0.9 % 100 mL IVPB  Status:  Discontinued        1 g 200 mL/hr over 30 Minutes Intravenous Every 8 hours 05/06/22 1438 05/12/22 0940   05/04/22 1000  vancomycin (VANCOCIN) IVPB 1000 mg/200 mL premix  Status:  Discontinued        1,000 mg 200 mL/hr over 60 Minutes Intravenous Every 24 hours 05/03/22 0802 05/08/22 0913   05/03/22 0845  vancomycin (VANCOREADY) IVPB 2000 mg/400 mL        2,000 mg 200 mL/hr over 120 Minutes Intravenous  Once 05/03/22 0751 05/03/22 1114   05/01/22 1800  piperacillin-tazobactam (ZOSYN) IVPB 3.375 g  Status:  Discontinued        3.375 g 12.5 mL/hr over 240 Minutes Intravenous Every 6 hours 05/01/22 1426 05/01/22 1427   05/01/22 1515   piperacillin-tazobactam (ZOSYN) IVPB 3.375 g  Status:  Discontinued        3.375 g 100 mL/hr over 30 Minutes Intravenous Every 6 hours 05/01/22 1428 05/06/22 1438   04/30/22 1400  piperacillin-tazobactam (ZOSYN) IVPB 2.25 g  Status:  Discontinued        2.25 g 100 mL/hr over 30 Minutes Intravenous Every 8 hours 04/30/22 1325 05/01/22 1426   04/29/22 1530  piperacillin-tazobactam (ZOSYN) IVPB 3.375 g  Status:  Discontinued        3.375 g 12.5 mL/hr over 240 Minutes Intravenous Every 8 hours 04/29/22 1433 04/30/22 1325   04/29/22 1145  metroNIDAZOLE (FLAGYL) IVPB 500 mg  Status:  Discontinued        500 mg 100 mL/hr  over 60 Minutes Intravenous Every 12 hours 04/29/22 1047 04/29/22 1433   04/29/22 1145  vancomycin (VANCOCIN) IVPB 1000 mg/200 mL premix  Status:  Discontinued        1,000 mg 200 mL/hr over 60 Minutes Intravenous  Once 04/29/22 1047 04/29/22 1055   04/29/22 1145  vancomycin (VANCOREADY) IVPB 1500 mg/300 mL  Status:  Discontinued        1,500 mg 150 mL/hr over 120 Minutes Intravenous Every 24 hours 04/29/22 1055 04/29/22 1357   04/29/22 1045  ceFEPIme (MAXIPIME) 2 g in sodium chloride 0.9 % 100 mL IVPB  Status:  Discontinued        2 g 200 mL/hr over 30 Minutes Intravenous Every 8 hours 04/29/22 0954 04/29/22 1433   04/23/22 0730  ceFAZolin (ANCEF) IVPB 2g/100 mL premix        2 g 200 mL/hr over 30 Minutes Intravenous On call to O.R. 04/23/22 4174 04/23/22 0828       Assessment/Plan: s/p Procedure(s): Coronary/Graft Acute MI Revascularization (N/A) LEFT HEART CATH AND CORONARY ANGIOGRAPHY (N/A) RIGHT/LEFT HEART CATH AND CORONARY ANGIOGRAPHY (N/A) Advance diet Wbc up a little 2 days ago. Will repeat since she is having more abd pain Ms. Karpowicz is s/p robotic incisional hernia repair on 04/23/22 - Tube feeds half rate - Pain medication prn - Okay for diet as deemed fit by speech therapy team - Similar situation over the weekend that happened a week or so ago -  tachycardia, RUQ abdominal pain and vomiting.  CT of the abdomen negative then.  CT PE ordered over the weekend and was negative.  Pain relieved by vomiting.  Hasn't had pain like this ever before surgery.  Will check LFTs, lipase and RUQ ultrasound.  Keep tube feeds at half rate for now.  Speech study today to determine if PEG may be needed.  She's feeling better this AM.  On Protonix for ulcer prophylaxis.     Acute metabolic encephalopathy superimposed on hx bipolar disorder, schizophrenia   ICU delirium- improved now off all IV infusions. Acute biventricular HFrEF (Etiology sepsis, stress induced CM. Has clean coronaries) Cardiogenic shock resolved, EF improved.  Acute hypoxic respiratory failure Aspiration PNA Pulm edema   RLL VAP  AKI with oliguria; no baseline CKD - Seems to have tolerated contrast for CTPE Anemia, acute on chronic  DM2 with hyperglycemia  Severe protein calorie malnutrition  Pressure injury lip - improving  Deconditioning    Appreciate [facility] care with multiple issues   Needs therapy and recouperation     Dispo - Medically stable for discharge to SNF.  Difficult placement.  LOS: 47 days    Patricia [name] 06/10/2022

## 2022-06-10 NOTE — Progress Notes (Signed)
PROGRESS NOTE    Patricia [name]  PPJ:[id number] DOB: December 30, 1970 DOA: 04/23/2022 PCP: [name], MD   Brief Narrative:   51 year old with history of DM2, depression, migraine, asthma, schizophrenia, HTN, HLD presented to the hospital for exertional hernia, robotic hernia repair on 04/23/2022 with mesh who developed postop ileus and is on TNA.  Also patient developed vomiting and hypotension after NG tube came out on 04/29/2022 with concerns of pneumonia which was treated with antibiotics and IV fluids.  During that time patient also required Levophed and eventually intubated on 10/3 for worsening metabolic acidosis complicated by acute kidney injury.  Echocardiogram showed depressed EF of 30-35% with elevated troponin.  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  Patient was seen by CHF team started on dopamine and CRRT as well.  Patient was again reintubated due to neuromuscular weakness on 10/8.  Eventually extubated on 10/14 to BiPAP and trach was placed due to weakness.  Tube feeds held due to nausea vomiting on 10/30.  Hospital course complicated by hypernatremia, she developed AKI, requiring brief course of HD, renal function has recovered. Dysphagia-on tube feeds via cortrak  Subjective:  Feels fair today, mild occasional abd discomfort, denies chest pain  Assessment and Plan:  Septic shock ([place]) secondary to aspiration pneumonia, right lower lobe Acute respiratory failure with prolonged mechanical ventilation status post tracheostomy -Trach management by pulmonary team. - Bronchodilators as needed, supportive care.  Out of bed to chair -toelrating PMV -Followed by another aspiration event, started empirically on cefepime, sputum culture growing Klebsiella, then ancef, completed Abx 11/8 -Patient failed MBS on Friday, remains n.p.o., only ice chips, feeding via cortrak only SLP following for weekly swallow eval's, if fails again may need to consider PEG tube  placement -await SLP Re-eval-MBS today  Status post hernia repair complicated by postop ileus/inflammatory enteritis Intermittent nausea vomiting -Management by general surgery.  She is working with speech and swallow.  Remains n.p.o., PMV.  Tube feeds, SLP following for dysphagia progression -Management per primary surgical team -Borderline abdominal symptoms with leukocytosis, may need repeat imaging-defer to [facility], WBC bettter today  Chest pain, tachycardia -resolved, no CAS, CTA negative for PE  Hypernatremia - resolved, if sodium starts to increase then will start on D5W , as surgery would like to avoid excessive free water via cortrak, as it usually causes nausea, vomiting and abdominal pain .  AKI -Likely due to ATN in the setting of cardiogenic/septic shock, required CRRT 10/410/14, first HD on 10/16, renal function has recovered, stable off HD  Right knee pain - Unclear etiology, no evidence of trauma.  Negative x-ray  Acute kidney injury, improving -Initially required CRRT and HD.  Now improving without dialysis  Biventricular failure, reduced EF - Last seen by CHF team 10/16.  -Now stable and remains euvolemic  -plans to follow-up outpatient. LHC shows clean coronaries.  -Continue metoprolol  Sinus tachycardia; improved.  - Now on metoprolol 50 mg twice daily.  IV as needed  Anemia of chronic disease - From underlying multiple medical issues.  Transfuse as necessary and attempt to keep hemoglobin greater than 8  Essential hypertension -Currently on metoprolol twice daily  Chronic low back pain -As needed pain control  Morbid obesity (HCC) -Body mass index is 41.37 kg/m..  Currently on tube feeds -Follow-up outpatient, weight loss diet and exercise  BIPOLAR DISORDER UNSPECIFIED -On Klonopin.  Should slowly resume home meds as appropriate  Diabetes mellitus type 2 in obese (HCC) -Sliding scale and  Accu-Cheks. -stable  Pressure area on the lip in the  setting of ET tube placement which was not present prior to admission.  Routine wound care.  PT-CIR   DVT prophylaxis: Subcu heparin Code Status: Full code Family Communication:  none at bedside  Final dispo per general surgery  Nutritional status  Signs/Symptoms: NPO status (with need for MBS per SLP)  Interventions: Tube feeding, Prostat, MVI  Body mass index is 28 kg/m.      Examination:  Gen: AAOx3, no distress HEENT: no JVD cortrak and trach collar  CVS: S1S2/RRR Lungs: decreased BS at bases, scattered ronchi Abd: soft, NT, BS present Ext: no edema Skin: no new rashes on exposed skin    Objective: Vitals:   06/10/22 0333 06/10/22 0819 06/10/22 0843 06/10/22 1143  BP:  132/88    Pulse: 97 (!) 102 100 79  Resp: '18 16 16 19  '$ Temp:  98.5 F (36.9 C)    TempSrc:  Oral    SpO2: 100% 99% 100% 100%  Weight:      Height:        Intake/Output Summary (Last 24 hours) at 06/10/2022 1340 Last data filed at 06/09/2022 2300 Gross per 24 hour  Intake --  Output 1100 ml  Net -1100 ml    Filed Weights   06/03/22 0500 06/04/22 0500 06/05/22 0500  Weight: 80.4 kg 78.7 kg 74 kg     Data Reviewed:   CBC: Recent Labs  Lab 06/04/22 0529 06/05/22 0638 06/07/22 0949 06/09/22 0726 06/10/22 0345  WBC 7.6 8.2 10.9* 16.4* 13.1*  HGB 8.7* 8.6* 9.5* 9.6* 9.0*  HCT 29.0* 28.7* 31.4* 31.1* 28.5*  MCV 91.8 90.8 90.5 90.4 87.2  PLT 461* 460* 482* 418* 254*   Basic Metabolic Panel: Recent Labs  Lab 06/04/22 0529 06/05/22 0638 06/07/22 0949 06/08/22 0142 06/09/22 0729 06/10/22 0345  NA 143 139 136 134* 137 135  K 4.1 4.1 4.5 4.5 5.1 4.4  CL 111 114* 109 109 107 104  CO2 18* 17* 17* 17* 18* 19*  GLUCOSE 132* 139* 130* 126* 128* 111*  BUN 29* 27* 24* 27* 25* 26*  CREATININE 1.21* 1.10* 0.97 0.87 1.06* 0.94  CALCIUM 9.6 9.3 9.6 9.2 9.6 9.6  MG 1.9  --   --   --   --   --   PHOS 4.1 4.6 4.3 4.7* 5.1* 4.6   GFR: Estimated Creatinine Clearance: 69 mL/min (by  C-G formula based on SCr of 0.94 mg/dL). Liver Function Tests: Recent Labs  Lab 06/05/22 2706 06/07/22 0949 06/08/22 0142 06/09/22 0729 06/10/22 0345  AST  --   --   --   --  28  ALT  --   --   --   --  29  ALKPHOS  --   --   --   --  104  BILITOT  --   --   --   --  0.2*  PROT  --   --   --   --  8.0  ALBUMIN 2.4* 2.6* 2.5* 2.6* 2.6*  2.6*   Recent Labs  Lab 06/10/22 0345  LIPASE 104*   No results for input(s): "AMMONIA" in the last 168 hours. Coagulation Profile: No results for input(s): "INR", "PROTIME" in the last 168 hours. Cardiac Enzymes: No results for input(s): "CKTOTAL", "CKMB", "CKMBINDEX", "TROPONINI" in the last 168 hours. BNP (last 3 results) No results for input(s): "PROBNP" in the last 8760 hours. HbA1C: No results for input(s): "HGBA1C" in  the last 72 hours. CBG: Recent Labs  Lab 06/09/22 1801 06/10/22 0012 06/10/22 0604 06/10/22 0907 06/10/22 1150  GLUCAP 162* 118* 107* 125* 108*   Lipid Profile: No results for input(s): "CHOL", "HDL", "LDLCALC", "TRIG", "CHOLHDL", "LDLDIRECT" in the last 72 hours. Thyroid Function Tests: No results for input(s): "TSH", "T4TOTAL", "FREET4", "T3FREE", "THYROIDAB" in the last 72 hours. Anemia Panel: No results for input(s): "VITAMINB12", "FOLATE", "FERRITIN", "TIBC", "IRON", "RETICCTPCT" in the last 72 hours. Sepsis Labs: No results for input(s): "PROCALCITON", "LATICACIDVEN" in the last 168 hours.  Recent Results (from the past 240 hour(s))  Culture, Respiratory w Gram Stain     Status: None   Collection Time: 06/01/22  8:39 AM   Specimen: Tracheal Aspirate; Respiratory  Result Value Ref Range Status   Specimen Description TRACHEAL ASPIRATE  Final   Special Requests NONE  Final   Gram Stain   Final    ABUNDANT WBC PRESENT, PREDOMINANTLY PMN MODERATE GRAM POSITIVE COCCI IN CHAINS IN PAIRS    Culture   Final    RARE KLEBSIELLA PNEUMONIAE WITHIN NORMAL RESPIRATORY FLORA Performed at [facility], [place] [address] [place], [place] [address]    Report Status 06/03/2022 FINAL  Final   Organism ID, Bacteria KLEBSIELLA PNEUMONIAE  Final      Susceptibility   Klebsiella pneumoniae - MIC*    AMPICILLIN >=32 RESISTANT Resistant     CEFAZOLIN <=4 SENSITIVE Sensitive     CEFEPIME <=0.12 SENSITIVE Sensitive     CEFTAZIDIME <=1 SENSITIVE Sensitive     CEFTRIAXONE <=0.25 SENSITIVE Sensitive     CIPROFLOXACIN <=0.25 SENSITIVE Sensitive     GENTAMICIN <=1 SENSITIVE Sensitive     IMIPENEM <=0.25 SENSITIVE Sensitive     TRIMETH/SULFA >=320 RESISTANT Resistant     AMPICILLIN/SULBACTAM 8 SENSITIVE Sensitive     PIP/TAZO <=4 SENSITIVE Sensitive     * RARE KLEBSIELLA PNEUMONIAE     Scheduled Meds:  acetaminophen (TYLENOL) oral liquid 160 mg/5 mL  650 mg Per Tube Q4H   famotidine  20 mg Per Tube BID   feeding supplement (PROSource TF20)  60 mL Per Tube Daily   fiber  1 packet Per Tube TID   gabapentin  200 mg Per Tube Q8H   heparin injection (subcutaneous)  5,000 Units Subcutaneous Q8H   insulin aspart  0-20 Units Subcutaneous Q6H   insulin aspart  4 Units Subcutaneous Q6H   metoprolol tartrate  50 mg Per Tube BID   multivitamin with minerals  1 tablet Per Tube Daily   nystatin   Topical TID   mouth rinse  15 mL Mouth Rinse 4 times per day   polyethylene glycol  17 g Oral Daily   QUEtiapine  25 mg Per Tube QHS   senna  1 tablet Per Tube Daily   Continuous Infusions:  feeding supplement (PEPTAMEN 1.5 CAL) 1,000 mL (06/09/22 1716)   methocarbamol (ROBAXIN) IV 500 mg (06/08/22 2149)     LOS: 34 days      Domenic Polite, MD [facility]  If 7PM-7AM, please contact night-coverage  06/10/2022, 1:40 PM

## 2022-06-10 NOTE — Progress Notes (Signed)
SLP Cancellation Note  Patient Details Name: Patricia [name] MRN: [id number] DOB: 08-Feb-1971   Cancelled treatment:    MBS was cancelled due to pt requiring bath during scheduled time in radiology.  [name] L. Tivis Ringer, MA CCC/SLP Clinical Specialist - Acute Care SLP Acute Rehabilitation Services Office number [phone]        [name] 06/10/2022, 3:45 PM

## 2022-06-11 ENCOUNTER — Inpatient Hospital Stay (HOSPITAL_COMMUNITY): Payer: Medicaid Other

## 2022-06-11 DIAGNOSIS — R6521 Severe sepsis with septic shock: Secondary | ICD-10-CM | POA: Diagnosis not present

## 2022-06-11 DIAGNOSIS — A419 Sepsis, unspecified organism: Secondary | ICD-10-CM | POA: Diagnosis not present

## 2022-06-11 LAB — RENAL FUNCTION PANEL
Albumin: 2.7 g/dL — ABNORMAL LOW (ref 3.5–5.0)
Anion gap: 11 (ref 5–15)
BUN: 30 mg/dL — ABNORMAL HIGH (ref 6–20)
CO2: 18 mmol/L — ABNORMAL LOW (ref 22–32)
Calcium: 9.6 mg/dL (ref 8.9–10.3)
Chloride: 108 mmol/L (ref 98–111)
Creatinine, Ser: 1.06 mg/dL — ABNORMAL HIGH (ref 0.44–1.00)
GFR, Estimated: >60 mL/min (ref >60)
Glucose, Bld: 105 mg/dL — ABNORMAL HIGH (ref 70–99)
Phosphorus: 5.4 mg/dL — ABNORMAL HIGH (ref 2.5–4.6)
Potassium: 4.1 mmol/L (ref 3.5–5.1)
Sodium: 137 mmol/L (ref 135–145)

## 2022-06-11 LAB — CBC
HCT: 30.9 % — ABNORMAL LOW (ref 36.0–46.0)
Hemoglobin: 9.4 g/dL — ABNORMAL LOW (ref 12.0–15.0)
MCH: 27.5 pg (ref 26.0–34.0)
MCHC: 30.4 g/dL (ref 30.0–36.0)
MCV: 90.4 fL (ref 80.0–100.0)
Platelets: 454 10*3/uL — ABNORMAL HIGH (ref 150–400)
RBC: 3.42 MIL/uL — ABNORMAL LOW (ref 3.87–5.11)
RDW: 17 % — ABNORMAL HIGH (ref 11.5–15.5)
WBC: 9.4 10*3/uL (ref 4.0–10.5)
nRBC: 0 % (ref 0.0–0.2)

## 2022-06-11 LAB — BASIC METABOLIC PANEL
Anion gap: 11 (ref 5–15)
BUN: 32 mg/dL — ABNORMAL HIGH (ref 6–20)
CO2: 18 mmol/L — ABNORMAL LOW (ref 22–32)
Calcium: 9.7 mg/dL (ref 8.9–10.3)
Chloride: 108 mmol/L (ref 98–111)
Creatinine, Ser: 0.92 mg/dL (ref 0.44–1.00)
GFR, Estimated: >60 mL/min (ref >60)
Glucose, Bld: 111 mg/dL — ABNORMAL HIGH (ref 70–99)
Potassium: 4.5 mmol/L (ref 3.5–5.1)
Sodium: 137 mmol/L (ref 135–145)

## 2022-06-11 LAB — GLUCOSE, CAPILLARY
Glucose-Capillary: 114 mg/dL — ABNORMAL HIGH (ref 70–99)
Glucose-Capillary: 115 mg/dL — ABNORMAL HIGH (ref 70–99)
Glucose-Capillary: 121 mg/dL — ABNORMAL HIGH (ref 70–99)
Glucose-Capillary: 85 mg/dL (ref 70–99)
Glucose-Capillary: 104 mg/dL — ABNORMAL HIGH (ref 70–99)

## 2022-06-11 LAB — MAGNESIUM: Magnesium: 1.9 mg/dL (ref 1.7–2.4)

## 2022-06-11 MED ORDER — CHLORHEXIDINE GLUCONATE CLOTH 2 % EX PADS
6.0000 | MEDICATED_PAD | Freq: Every day | CUTANEOUS | Status: DC
  Administered 2022-06-11 – 2022-06-21 (×10): 6 via TOPICAL

## 2022-06-11 MED ORDER — MAGNESIUM SULFATE 2 GM/50ML IV SOLN
2.0000 g | Freq: Once | INTRAVENOUS | Status: AC
  Administered 2022-06-11: 2 g via INTRAVENOUS
  Filled 2022-06-11: qty 50

## 2022-06-11 MED ORDER — SODIUM CHLORIDE 0.9% FLUSH
10.0000 mL | Freq: Two times a day (BID) | INTRAVENOUS | Status: DC
  Administered 2022-06-11 – 2022-06-18 (×15): 10 mL
  Administered 2022-06-18: 40 mL
  Administered 2022-06-19 – 2022-06-21 (×5): 10 mL

## 2022-06-11 NOTE — Progress Notes (Addendum)
PROGRESS NOTE    Patricia [name]  ZOX:[id number] DOB: 12-Sep-1970 DOA: 04/23/2022 PCP: [name], MD   Brief Narrative:   51 year old with history of DM2, depression, migraine, asthma, schizophrenia, HTN, HLD presented to the hospital for exertional hernia, robotic hernia repair on 04/23/2022 with mesh who developed postop ileus and is on TNA.  Also patient developed vomiting and hypotension after NG tube came out on 04/29/2022 with concerns of pneumonia which was treated with antibiotics and IV fluids.  During that time patient also required Levophed and eventually intubated on 10/3 for worsening metabolic acidosis complicated by acute kidney injury.  Echocardiogram showed depressed EF of 30-35% with elevated troponin.  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  Patient was seen by CHF team started on dopamine and CRRT as well.  Patient was again reintubated due to neuromuscular weakness on 10/8.  Eventually extubated on 10/14 to BiPAP and trach was placed due to weakness.  Tube feeds held due to nausea vomiting on 10/30.  Hospital course complicated by hypernatremia, she developed AKI, requiring brief course of HD, renal function has recovered. Dysphagia-on tube feeds via cortrak -Weekend 11/11-12 with chest pain and tachycardia, CTA chest negative for PE, symptoms resolved -Pulmonary plans downsizing trach followed by attempt at decannulation  Subjective:  -Feels better overall, was unable to have swallow eval yesterday  Assessment and Plan:  Septic shock (HCC) secondary to aspiration pneumonia, right lower lobe Acute respiratory failure with prolonged mechanical ventilation status post tracheostomy -Trach management by pulmonary team. - Bronchodilators as needed, supportive care.  Out of bed to chair -toelrating PMV -Followed by another aspiration event, started empirically on cefepime, sputum culture growing Klebsiella, then ancef, completed Abx  11/8 -Patient failed MBS on Friday, remains n.p.o., only ice chips, feeding via cortrak only, SLP following for weekly swallow eval's, if fails again may need to consider PEG tube placement -Follow-up repeat MBS per SLP today, hopefully can be started on a diet -Pulmonary following, plan to downsize trach followed by attempted decannulation  Status post hernia repair complicated by postop ileus/inflammatory enteritis Intermittent nausea vomiting -Management by general surgery.  She is working with speech and swallow.  Remains n.p.o., PMV.  Tube feeds, SLP following for dysphagia progression -Management per primary surgical team -Leukocytosis has resolved, abdominal symptoms stable  Chest pain, tachycardia -resolved, no ACS, CTA negative for PE  Hypernatremia - resolved, if sodium starts to increase then will start on D5W , as surgery would like to avoid excessive free water via cortrak, as it usually causes nausea, vomiting and abdominal pain .  AKI -Likely due to ATN in the setting of cardiogenic/septic shock, required CRRT 10/410/14, first HD on 10/16, renal function has recovered, stable off HD  Biventricular failure, reduced EF - Last seen by CHF team 10/16.  -last ECHo w/EF 50-55% with normal WMA -Now stable and remains euvolemic  -plans to follow-up outpatient. LHC shows clean coronaries.  -Continue metoprolol  Sinus tachycardia; improved.  - Now on metoprolol 50 mg twice daily.  IV as needed  Anemia of chronic disease - From underlying multiple medical issues.  Transfuse as necessary and attempt to keep hemoglobin greater than 8  Essential hypertension -Currently on metoprolol twice daily  Chronic low back pain -As needed pain control  Morbid obesity (HCC) -Body mass index is 41.37 kg/m..  Currently on tube feeds -Follow-up outpatient, weight loss diet and exercise  BIPOLAR DISORDER UNSPECIFIED -On Klonopin.  Should slowly resume home meds as  appropriate  Diabetes mellitus type 2 in obese (HCC) -Sliding scale and Accu-Cheks. -stable  Pressure area on the lip in the setting of ET tube placement which was not present prior to admission.  Routine wound care.  PT-CIR   DVT prophylaxis: Subcu heparin Code Status: Full code Family Communication:  none at bedside  Final dispo per general surgery  Nutritional status  Signs/Symptoms: NPO status (with need for MBS per SLP)  Interventions: Tube feeding, Prostat, MVI  Body mass index is 28 kg/m.      Examination:  Obese chronically ill female sitting up in bed, AAOx3 HEENT: No JVD, core track and trach collar noted CVS: S1-S2, regular rhythm Lungs: Decreased breath sounds at the bases, few scattered rhonchi Abd: soft, mild tenderness, BS present Ext: no edema Skin: no new rashes on exposed skin    Objective: Vitals:   06/11/22 0433 06/11/22 0441 06/11/22 0808 06/11/22 0900  BP: 115/87  (!) 121/94   Pulse: 96 95 (!) 108 (!) 102  Resp:  '17 17 20  '$ Temp: 98.9 F (37.2 C)  97.9 F (36.6 C)   TempSrc: Oral  Oral   SpO2: 97% 97% 99% 96%  Weight:      Height:       No intake or output data in the 24 hours ending 06/11/22 0927   Filed Weights   06/03/22 0500 06/04/22 0500 06/05/22 0500  Weight: 80.4 kg 78.7 kg 74 kg     Data Reviewed:   CBC: Recent Labs  Lab 06/05/22 0638 06/07/22 0949 06/09/22 0726 06/10/22 0345 06/11/22 0246  WBC 8.2 10.9* 16.4* 13.1* 9.4  HGB 8.6* 9.5* 9.6* 9.0* 9.4*  HCT 28.7* 31.4* 31.1* 28.5* 30.9*  MCV 90.8 90.5 90.4 87.2 90.4  PLT 460* 482* 418* 424* 938*   Basic Metabolic Panel: Recent Labs  Lab 06/07/22 0949 06/08/22 0142 06/09/22 0729 06/10/22 0345 06/11/22 0246  NA 136 134* 137 135 137  K 4.5 4.5 5.1 4.4 4.1  CL 109 109 107 104 108  CO2 17* 17* 18* 19* 18*  GLUCOSE 130* 126* 128* 111* 105*  BUN 24* 27* 25* 26* 30*  CREATININE 0.97 0.87 1.06* 0.94 1.06*  CALCIUM 9.6 9.2 9.6 9.6 9.6  PHOS 4.3 4.7* 5.1* 4.6  5.4*   GFR: Estimated Creatinine Clearance: 61.2 mL/min (A) (by C-G formula based on SCr of 1.06 mg/dL (H)). Liver Function Tests: Recent Labs  Lab 06/07/22 0949 06/08/22 0142 06/09/22 0729 06/10/22 0345 06/11/22 0246  AST  --   --   --  28  --   ALT  --   --   --  29  --   ALKPHOS  --   --   --  104  --   BILITOT  --   --   --  0.2*  --   PROT  --   --   --  8.0  --   ALBUMIN 2.6* 2.5* 2.6* 2.6*  2.6* 2.7*   Recent Labs  Lab 06/10/22 0345  LIPASE 104*   No results for input(s): "AMMONIA" in the last 168 hours. Coagulation Profile: No results for input(s): "INR", "PROTIME" in the last 168 hours. Cardiac Enzymes: No results for input(s): "CKTOTAL", "CKMB", "CKMBINDEX", "TROPONINI" in the last 168 hours. BNP (last 3 results) No results for input(s): "PROBNP" in the last 8760 hours. HbA1C: No results for input(s): "HGBA1C" in the last 72 hours. CBG: Recent Labs  Lab 06/10/22 1150 06/10/22 1844 06/10/22 2355 06/11/22 0553 06/11/22  0807  GLUCAP 108* 94 111* 114* 115*   Lipid Profile: No results for input(s): "CHOL", "HDL", "LDLCALC", "TRIG", "CHOLHDL", "LDLDIRECT" in the last 72 hours. Thyroid Function Tests: No results for input(s): "TSH", "T4TOTAL", "FREET4", "T3FREE", "THYROIDAB" in the last 72 hours. Anemia Panel: No results for input(s): "VITAMINB12", "FOLATE", "FERRITIN", "TIBC", "IRON", "RETICCTPCT" in the last 72 hours. Sepsis Labs: No results for input(s): "PROCALCITON", "LATICACIDVEN" in the last 168 hours.  No results found for this or any previous visit (from the past 240 hour(s)).    Scheduled Meds:  acetaminophen (TYLENOL) oral liquid 160 mg/5 mL  650 mg Per Tube Q4H   Chlorhexidine Gluconate Cloth  6 each Topical Daily   famotidine  20 mg Per Tube BID   feeding supplement (PROSource TF20)  60 mL Per Tube Daily   fiber  1 packet Per Tube TID   gabapentin  200 mg Per Tube Q8H   heparin injection (subcutaneous)  5,000 Units Subcutaneous Q8H    insulin aspart  0-20 Units Subcutaneous Q6H   insulin aspart  4 Units Subcutaneous Q6H   metoprolol tartrate  50 mg Per Tube BID   multivitamin with minerals  1 tablet Per Tube Daily   nystatin   Topical TID   mouth rinse  15 mL Mouth Rinse 4 times per day   polyethylene glycol  17 g Oral Daily   QUEtiapine  25 mg Per Tube QHS   senna  1 tablet Per Tube Daily   sodium chloride flush  10-40 mL Intracatheter Q12H   Continuous Infusions:  feeding supplement (PEPTAMEN 1.5 CAL) 1,000 mL (06/09/22 1716)   methocarbamol (ROBAXIN) IV 500 mg (06/08/22 2149)     LOS: 48 days    Domenic Polite, MD [facility]  If 7PM-7AM, please contact night-coverage  06/11/2022, 9:27 AM

## 2022-06-11 NOTE — Progress Notes (Signed)
PROGRESS NOTE    Patricia [name]  SKA:[id number] DOB: 08-20-1970 DOA: 04/23/2022 PCP: [name], MD   Brief Narrative:   51 year old with history of DM2, depression, migraine, asthma, schizophrenia, HTN, HLD presented to the hospital for exertional hernia, robotic hernia repair on 04/23/2022 with mesh who developed postop ileus and is on TNA.  Also patient developed vomiting and hypotension after NG tube came out on 04/29/2022 with concerns of pneumonia which was treated with antibiotics and IV fluids.  During that time patient also required Levophed and eventually intubated on 10/3 for worsening metabolic acidosis complicated by acute kidney injury.  Echocardiogram showed depressed EF of 30-35% with elevated troponin.  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  Patient was seen by CHF team started on dopamine and CRRT as well.  Patient was again reintubated due to neuromuscular weakness on 10/8.  Eventually extubated on 10/14 to BiPAP and trach was placed due to weakness.  Tube feeds held due to nausea vomiting on 10/30.  Hospital course complicated by hypernatremia, she developed AKI, requiring brief course of HD, renal function has recovered. Dysphagia-on tube feeds via cortrak -Weekend 11/11-12 with chest pain and tachycardia, CTA chest negative for PE, symptoms resolved -Pulmonary plans downsizing trach followed by attempt at decannulation  Subjective:  -Feels better overall, was unable to have swallow eval yesterday  Assessment and Plan:  Septic shock (HCC) secondary to aspiration pneumonia, right lower lobe Acute respiratory failure with prolonged mechanical ventilation status post tracheostomy -Trach management by pulmonary team. - Bronchodilators as needed, supportive care.  Out of bed to chair -toelrating PMV -Followed by another aspiration event, started empirically on cefepime, sputum culture growing Klebsiella, then ancef, completed Abx  11/8 -Patient failed MBS on Friday, remains n.p.o., only ice chips, feeding via cortrak only, SLP following for weekly swallow eval's, if fails again may need to consider PEG tube placement -Follow-up repeat MBS per SLP today, hopefully can be started on a diet -Pulmonary following, plan to downsize trach followed by attempted decannulation  Status post hernia repair complicated by postop ileus/inflammatory enteritis Intermittent nausea vomiting -Management by general surgery.  She is working with speech and swallow.  Remains n.p.o., PMV.  Tube feeds, SLP following for dysphagia progression -Management per primary surgical team  Chest pain, tachycardia -resolved, no ACS, CTA negative for PE  Hypernatremia - resolved, if sodium starts to increase then will start on D5W , as surgery would like to avoid excessive free water via cortrak, as it usually causes nausea, vomiting and abdominal pain .  AKI -Likely due to ATN in the setting of cardiogenic/septic shock, required CRRT 10/410/14, first HD on 10/16, renal function has recovered, stable off HD  Right knee pain - Unclear etiology, no evidence of trauma.  Negative x-ray  Acute kidney injury, improving -Initially required CRRT and HD.  Now improving without dialysis  Biventricular failure, reduced EF - Last seen by CHF team 10/16.  -Now stable and remains euvolemic  -plans to follow-up outpatient. LHC shows clean coronaries.  -Continue metoprolol  Sinus tachycardia; improved.  - Now on metoprolol 50 mg twice daily.  IV as needed  Anemia of chronic disease - From underlying multiple medical issues.  Transfuse as necessary and attempt to keep hemoglobin greater than 8  Essential hypertension -Currently on metoprolol twice daily  Chronic low back pain -As needed pain control  Morbid obesity (HCC) -Body mass index is 41.37 kg/m..  Currently on tube feeds -Follow-up outpatient, weight loss  diet and exercise  BIPOLAR DISORDER  UNSPECIFIED -On Klonopin.  Should slowly resume home meds as appropriate  Diabetes mellitus type 2 in obese (HCC) -Sliding scale and Accu-Cheks. -stable  Pressure area on the lip in the setting of ET tube placement which was not present prior to admission.  Routine wound care.  PT-CIR   DVT prophylaxis: Subcu heparin Code Status: Full code Family Communication:  none at bedside  Final dispo per general surgery  Nutritional status  Signs/Symptoms: NPO status (with need for MBS per SLP)  Interventions: Tube feeding, Prostat, MVI  Body mass index is 28 kg/m.      Examination:  Gen: AAOx3, no distress HEENT: no JVD cortrak and trach collar  CVS: S1S2/RRR Lungs: decreased BS at bases, scattered ronchi Abd: soft, NT, BS present Ext: no edema Skin: no new rashes on exposed skin    Objective: Vitals:   06/11/22 0433 06/11/22 0441 06/11/22 0808 06/11/22 0900  BP: 115/87  (!) 121/94   Pulse: 96 95 (!) 108 (!) 102  Resp:  '17 17 20  '$ Temp: 98.9 F (37.2 C)  97.9 F (36.6 C)   TempSrc: Oral  Oral   SpO2: 97% 97% 99% 96%  Weight:      Height:       No intake or output data in the 24 hours ending 06/11/22 0924   Filed Weights   06/03/22 0500 06/04/22 0500 06/05/22 0500  Weight: 80.4 kg 78.7 kg 74 kg     Data Reviewed:   CBC: Recent Labs  Lab 06/05/22 0638 06/07/22 0949 06/09/22 0726 06/10/22 0345 06/11/22 0246  WBC 8.2 10.9* 16.4* 13.1* 9.4  HGB 8.6* 9.5* 9.6* 9.0* 9.4*  HCT 28.7* 31.4* 31.1* 28.5* 30.9*  MCV 90.8 90.5 90.4 87.2 90.4  PLT 460* 482* 418* 424* 850*   Basic Metabolic Panel: Recent Labs  Lab 06/07/22 0949 06/08/22 0142 06/09/22 0729 06/10/22 0345 06/11/22 0246  NA 136 134* 137 135 137  K 4.5 4.5 5.1 4.4 4.1  CL 109 109 107 104 108  CO2 17* 17* 18* 19* 18*  GLUCOSE 130* 126* 128* 111* 105*  BUN 24* 27* 25* 26* 30*  CREATININE 0.97 0.87 1.06* 0.94 1.06*  CALCIUM 9.6 9.2 9.6 9.6 9.6  PHOS 4.3 4.7* 5.1* 4.6 5.4*    GFR: Estimated Creatinine Clearance: 61.2 mL/min (A) (by C-G formula based on SCr of 1.06 mg/dL (H)). Liver Function Tests: Recent Labs  Lab 06/07/22 0949 06/08/22 0142 06/09/22 0729 06/10/22 0345 06/11/22 0246  AST  --   --   --  28  --   ALT  --   --   --  29  --   ALKPHOS  --   --   --  104  --   BILITOT  --   --   --  0.2*  --   PROT  --   --   --  8.0  --   ALBUMIN 2.6* 2.5* 2.6* 2.6*  2.6* 2.7*   Recent Labs  Lab 06/10/22 0345  LIPASE 104*   No results for input(s): "AMMONIA" in the last 168 hours. Coagulation Profile: No results for input(s): "INR", "PROTIME" in the last 168 hours. Cardiac Enzymes: No results for input(s): "CKTOTAL", "CKMB", "CKMBINDEX", "TROPONINI" in the last 168 hours. BNP (last 3 results) No results for input(s): "PROBNP" in the last 8760 hours. HbA1C: No results for input(s): "HGBA1C" in the last 72 hours. CBG: Recent Labs  Lab 06/10/22 1150 06/10/22 1844  06/10/22 2355 06/11/22 0553 06/11/22 0807  GLUCAP 108* 94 111* 114* 115*   Lipid Profile: No results for input(s): "CHOL", "HDL", "LDLCALC", "TRIG", "CHOLHDL", "LDLDIRECT" in the last 72 hours. Thyroid Function Tests: No results for input(s): "TSH", "T4TOTAL", "FREET4", "T3FREE", "THYROIDAB" in the last 72 hours. Anemia Panel: No results for input(s): "VITAMINB12", "FOLATE", "FERRITIN", "TIBC", "IRON", "RETICCTPCT" in the last 72 hours. Sepsis Labs: No results for input(s): "PROCALCITON", "LATICACIDVEN" in the last 168 hours.  No results found for this or any previous visit (from the past 240 hour(s)).    Scheduled Meds:  acetaminophen (TYLENOL) oral liquid 160 mg/5 mL  650 mg Per Tube Q4H   Chlorhexidine Gluconate Cloth  6 each Topical Daily   famotidine  20 mg Per Tube BID   feeding supplement (PROSource TF20)  60 mL Per Tube Daily   fiber  1 packet Per Tube TID   gabapentin  200 mg Per Tube Q8H   heparin injection (subcutaneous)  5,000 Units Subcutaneous Q8H   insulin  aspart  0-20 Units Subcutaneous Q6H   insulin aspart  4 Units Subcutaneous Q6H   metoprolol tartrate  50 mg Per Tube BID   multivitamin with minerals  1 tablet Per Tube Daily   nystatin   Topical TID   mouth rinse  15 mL Mouth Rinse 4 times per day   polyethylene glycol  17 g Oral Daily   QUEtiapine  25 mg Per Tube QHS   senna  1 tablet Per Tube Daily   sodium chloride flush  10-40 mL Intracatheter Q12H   Continuous Infusions:  feeding supplement (PEPTAMEN 1.5 CAL) 1,000 mL (06/09/22 1716)   methocarbamol (ROBAXIN) IV 500 mg (06/08/22 2149)     LOS: 48 days      Domenic Polite, MD [facility]  If 7PM-7AM, please contact night-coverage  06/11/2022, 9:24 AM

## 2022-06-11 NOTE — Progress Notes (Signed)
NAME:  Patricia [name], MRN:  [id number], DOB:  07-May-1971, LOS: 64 ADMISSION DATE:  04/23/2022, CONSULTATION DATE:  04/29/2022 REFERRING MD:  Felicie Morn, MD, CHIEF COMPLAINT:   Small bowel obstruction, aspiration event, sepsis, respiratory failure    History of Present Illness:  [name] with above hx and nuc study in 2015 neg for ischemia done for chest pain and echo with EF 60-65% presented to hospital for excisional hernia repair (robotic hernia repair 04/23/22) with mesh, ileus post op on TNA. Developed vomiting and hypotension after NG came out on 04/29/22 and PNA on CXR, was tachycardic and BP improved with IV fluids. DX with sepsis and ABX started. At one point prolonged Qtc. Pt admitted to ICU on 10/2.  Was placed on levophed. Early AM on 10/3 was intubated for worsening metabolic/lactic acidosis. She had AKI as well. Crt was 6.01, her normal 0.9. Today Cr is 4 on CRRT.  Pertinent  Medical History  DM2 Depression OSA Migraines  Asthma Obesity Schizophrenia HTN HLD  Significant Hospital Events: Including procedures, antibiotic start and stop dates in addition to other pertinent events   9/26 Robotic hernia repair complicated by abd adhesions.  10/2 CT + for developing SBO and bilat lower lobe opacities most likely aspiration PNA. Progressive decompensation requiring transfer to ICU 10/2 PCCM consulted for respiratory failure & shock 10\3 Intubated for resp failure. CVL and arterial line placed. 04/30/22: EF 30-35%, RV moderately reduced, mild to moderate MR, IVC collapsed throughout respiratory cycle 10\4 EKG changes/trop increased--> and taken to cath lab - No obstructive CAD, but in decompensated heart failure, Bedside echo with EF 15%, new WMA, moderate RV dysfunction, trivial pericardial effusion.underwent emergent L/RHC. Normal coronaries, RA mean 13, PA mean 29, LVEDP 16, PCWP 19, CO/CI 3.96/1.87   returned to CICU post-cath. Right heart cath placed right fem.  Right IJ HD cath placed.  10/5 advanced HF team following, shock supported on norepi/vasopressin and Dobutamine. On CRRT.  10/6 weaning Norepi. Starting to wean fent. Needing restraints.  10/8: Reintubated due to neuromuscular weakness.  Bedside echo demonstrating improved RV function, LV function remained depressed 10/9 CT abdomen pelvis.suggesting wall thickening throughout the proximal and mid small bowel consistent with possible infectious or inflammatory enteritis there is also bibasilar airspace disease which could reflect pneumonia.  Antibiotic coverage widened.  Zosyn changed to meropenem.  Tolerating trickle feeds still on TPN 10/10 stopped stress dose steroids.  Resumed subcutaneous heparin. 10/13 albumin for coox decrease. Weaning vent . Failed SBT. At goal TF. 10/14 extubated to bipap failed due to weakness, trached 10/15 trial off CRRT, lasix 10/18 RIJ permcath 10/22 transfer 4N as a lateral after aspiration event briefly requiring mechanical ventilation 10/25 transfer to [facility] progressive  11/1 TDC removed 11/13 swallow eval (cancelled) 11/14 trach down-sized to 4 cuffless. Capping trials started.  Interim History / Subjective:  Just vomited large volume after trach change  Objective   Blood pressure (Abnormal) 121/94, pulse (Abnormal) 102, temperature 97.9 F (36.6 C), temperature source Oral, resp. rate 20, height '5\' 4"'$  (1.626 m), weight 74 kg, last menstrual period 07/07/2014, SpO2 96 %.    FiO2 (%):  [28 %] 28 %   Intake/Output Summary (Last 24 hours) at 06/11/2022 1130 Last data filed at 06/11/2022 1038 Gross per 24 hour  Intake no documentation  Output 400 ml  Net -400 ml   Filed Weights   06/03/22 0500 06/04/22 0500 06/05/22 0500  Weight: 80.4 kg 78.7 kg 74 kg  Examination:  General resting in bed No distress  HENT now has 4 cuffless trach. 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 Has  AKI due to septic ATN, no renal recovery S/p hernia repair &  LOA inflammatory enteritis  Anemia, acute on chronic  Acute urinary retention DM2 with hyperglycemia  Severe protein calorie malnutrition  Morbid obesity Pressure injury- lip in the setting of ET tube: Not present on admission  Pulm problem list: Acute respiratory failure with hypoxia Tracheostomy dependence  dysphagia   Discussion: She is now down-size to 4. Tolerated it well. My only reservation is her nausea and vomiting (vomited a large amt this am shortly after change). I do not think this should prevent [place] from getting the trach out but it would be great if it were not a issue,   Plan: Start capping trials Swallow eval when able Have instructed nursing staff to let surgical team know about the vomiting Will see her again tomorrow. I am hopeful she will be able to have the trach removed   Erick Colace ACNP-BC [place] Pager # [phone] OR # [phone] if no answer   Best Practice (right click and "Reselect all SmartList Selections" daily)

## 2022-06-11 NOTE — Procedures (Signed)
Tracheostomy Exchange Procedure Note  Patricia [name]  [id number]  [birth year]  Date:06/11/22  Time:11:38 AM   Provider Performing:Pete E Kary Kos   Procedure: Tracheostomy Exchange  Indication(s) Downsizing to # 4  Consent Risks of the procedure as well as the alternatives and risks of each were explained to the patient and/or caregiver.  Consent for the procedure was obtained and is signed in the bedside chart  Anesthesia None   Time Out Verified patient identification, verified procedure, site/side was marked, verified correct patient position, special equipment/implants available, medications/allergies/relevant history reviewed, required imaging and test results available.   Sterile Technique Hand hygiene, gloves   Procedure Description Size 6 cuffed' existing Shiley removed and size 4 uncuffed Shiley placed through stoma.   Complications/Tolerance None; patient tolerated the procedure well..   EBL Minimal  Erick Colace ACNP-BC [place] Pager # [phone] OR # [phone] if no answer

## 2022-06-11 NOTE — Progress Notes (Signed)
Pt capped at this time, SPO2 96% on RA. No distress noted at this time. RT will continue to monitor.

## 2022-06-11 NOTE — Progress Notes (Signed)
41 Days Post-Op   Subjective/Chief Complaint: WBC Better, RUQ improving with tube feeds slowed   Objective: Vital signs in last 24 hours: Temp:  [97.7 F (36.5 C)-98.9 F (37.2 C)] 98.9 F (37.2 C) (11/14 0433) Pulse Rate:  [79-102] 95 (11/14 0441) Resp:  [15-19] 17 (11/14 0441) BP: (115-132)/(83-88) 115/87 (11/14 0433) SpO2:  [97 %-100 %] 97 % (11/14 0441) FiO2 (%):  [28 %] 28 % (11/14 0441) Last BM Date : 06/08/22  Intake/Output from previous day: No intake/output data recorded. Intake/Output this shift: No intake/output data recorded.  General appearance: alert and cooperative Resp: clear to auscultation bilaterally Cardio: regular rate and rhythm GI: soft, tender on right. Tube feeds at goal  Lab Results:  Recent Labs    06/10/22 0345 06/11/22 0246  WBC 13.1* 9.4  HGB 9.0* 9.4*  HCT 28.5* 30.9*  PLT 424* 454*    BMET Recent Labs    06/10/22 0345 06/11/22 0246  NA 135 137  K 4.4 4.1  CL 104 108  CO2 19* 18*  GLUCOSE 111* 105*  BUN 26* 30*  CREATININE 0.94 1.06*  CALCIUM 9.6 9.6    PT/INR No results for input(s): "LABPROT", "INR" in the last 72 hours. ABG No results for input(s): "PHART", "HCO3" in the last 72 hours.  Invalid input(s): "PCO2", "PO2"  Studies/Results: US Abdomen Limited RUQ (LIVER/GB)  Result Date: 06/10/2022 CLINICAL DATA:  Right upper quadrant pain EXAM: ULTRASOUND ABDOMEN LIMITED RIGHT UPPER QUADRANT COMPARISON:  CT 05/29/2022, ultrasound 08/16/2021 FINDINGS: Gallbladder: No gallstones or wall thickening visualized. No sonographic Murphy sign noted by sonographer. Common bile duct: Diameter: 4 mm. Liver: No focal lesion identified. Within normal limits in parenchymal echogenicity. Portal vein is patent on color Doppler imaging with normal direction of blood flow towards the liver. Other: None. IMPRESSION: Unremarkable right upper quadrant ultrasound. Electronically Signed   By: Davina Poke D.O.   On: 06/10/2022 16:46   CT  Angio Chest Pulmonary Embolism (PE) W or WO Contrast  Result Date: 06/09/2022 CLINICAL DATA:  Chest pain, shortness of breath. EXAM: CT ANGIOGRAPHY CHEST WITH CONTRAST TECHNIQUE: Multidetector CT imaging of the chest was performed using the standard protocol during bolus administration of intravenous contrast. Multiplanar CT image reconstructions and MIPs were obtained to evaluate the vascular anatomy. RADIATION DOSE REDUCTION: This exam was performed according to the departmental dose-optimization program which includes automated exposure control, adjustment of the mA and/or kV according to patient size and/or use of iterative reconstruction technique. CONTRAST:  84m OMNIPAQUE IOHEXOL 350 MG/ML SOLN COMPARISON:  04/30/2022. FINDINGS: Cardiovascular: Pulmonary arteries are relatively well opacified. There is no evidence of a pulmonary embolism. Heart is mildly enlarged. No pericardial effusion. Coronary arteries are unremarkable. Great vessels are normal in caliber. No aortic dissection or atherosclerosis. Mediastinum/Nodes: Well-positioned tracheostomy tube. No neck base, mediastinal or hilar masses. No enlarged lymph nodes. Nasal/orogastric tube extends well below the diaphragm into the stomach. Esophagus unremarkable. Normal trachea. Lungs/Pleura: Dependent opacities in both lower lobes, right greater than left, consistent with atelectasis. Remainder of the lungs is clear. No pleural effusion or pneumothorax. Upper Abdomen: Visualized upper abdominal structures are unremarkable. Musculoskeletal: No fracture or acute finding. No bone lesion. No chest wall mass. Review of the MIP images confirms the above findings. IMPRESSION: 1. No evidence of a pulmonary embolism. 2. No acute findings. 3. Right greater than left lower lobe dependent lung opacity consistent with atelectasis, similar to the prior CT. No convincing pneumonia and no pulmonary edema. 4. Well-positioned tracheostomy tube.  Electronically Signed    By: Lajean Manes M.D.   On: 06/09/2022 15:24    Anti-infectives: Anti-infectives (From admission, onward)    Start     Dose/Rate Route Frequency Ordered Stop   06/04/22 2100  ceFAZolin (ANCEF) IVPB 2g/100 mL premix        2 g 200 mL/hr over 30 Minutes Intravenous Every 8 hours 06/04/22 1103 06/05/22 2037   06/01/22 1515  ceFEPIme (MAXIPIME) 2 g in sodium chloride 0.9 % 100 mL IVPB  Status:  Discontinued        2 g 200 mL/hr over 30 Minutes Intravenous Every 12 hours 06/01/22 1420 06/04/22 1103   05/15/22 1445  ceFAZolin (ANCEF) IVPB 2g/100 mL premix       Note to Pharmacy: To be given in IR   2 g 200 mL/hr over 30 Minutes Intravenous  Once 05/15/22 1352 05/15/22 1616   05/15/22 1230  ceFAZolin (ANCEF) powder 1 g  Status:  Discontinued        1 g Other To Surgery 05/15/22 1140 05/15/22 1239   05/15/22 1230  ceFAZolin (ANCEF) IVPB 2g/100 mL premix        2 g 200 mL/hr over 30 Minutes Intravenous To Radiology 05/15/22 1140 05/15/22 1140   05/15/22 0000  ceFAZolin (ANCEF) IVPB 2g/100 mL premix        2 g 200 mL/hr over 30 Minutes Intravenous To Radiology 05/14/22 1308 05/15/22 0005   05/13/22 2134  meropenem (MERREM) 500 mg in sodium chloride 0.9 % 100 mL IVPB        500 mg 200 mL/hr over 30 Minutes Intravenous Every 24 hours 05/13/22 0815 05/17/22 0700   05/13/22 1000  meropenem (MERREM) 500 mg in sodium chloride 0.9 % 100 mL IVPB  Status:  Discontinued        500 mg 200 mL/hr over 30 Minutes Intravenous Every 12 hours 05/12/22 0940 05/12/22 0946   05/12/22 2200  meropenem (MERREM) 500 mg in sodium chloride 0.9 % 100 mL IVPB  Status:  Discontinued        500 mg 200 mL/hr over 30 Minutes Intravenous Every 12 hours 05/12/22 0946 05/13/22 0815   05/12/22 2000  meropenem (MERREM) 1 g in sodium chloride 0.9 % 100 mL IVPB  Status:  Discontinued        1 g 200 mL/hr over 30 Minutes Intravenous  Once 05/12/22 0940 05/12/22 0946   05/06/22 1530  meropenem (MERREM) 1 g in sodium chloride 0.9  % 100 mL IVPB  Status:  Discontinued        1 g 200 mL/hr over 30 Minutes Intravenous Every 8 hours 05/06/22 1438 05/12/22 0940   05/04/22 1000  vancomycin (VANCOCIN) IVPB 1000 mg/200 mL premix  Status:  Discontinued        1,000 mg 200 mL/hr over 60 Minutes Intravenous Every 24 hours 05/03/22 0802 05/08/22 0913   05/03/22 0845  vancomycin (VANCOREADY) IVPB 2000 mg/400 mL        2,000 mg 200 mL/hr over 120 Minutes Intravenous  Once 05/03/22 0751 05/03/22 1114   05/01/22 1800  piperacillin-tazobactam (ZOSYN) IVPB 3.375 g  Status:  Discontinued        3.375 g 12.5 mL/hr over 240 Minutes Intravenous Every 6 hours 05/01/22 1426 05/01/22 1427   05/01/22 1515  piperacillin-tazobactam (ZOSYN) IVPB 3.375 g  Status:  Discontinued        3.375 g 100 mL/hr over 30 Minutes Intravenous Every 6 hours 05/01/22 1428 05/06/22 1438  04/30/22 1400  piperacillin-tazobactam (ZOSYN) IVPB 2.25 g  Status:  Discontinued        2.25 g 100 mL/hr over 30 Minutes Intravenous Every 8 hours 04/30/22 1325 05/01/22 1426   04/29/22 1530  piperacillin-tazobactam (ZOSYN) IVPB 3.375 g  Status:  Discontinued        3.375 g 12.5 mL/hr over 240 Minutes Intravenous Every 8 hours 04/29/22 1433 04/30/22 1325   04/29/22 1145  metroNIDAZOLE (FLAGYL) IVPB 500 mg  Status:  Discontinued        500 mg 100 mL/hr over 60 Minutes Intravenous Every 12 hours 04/29/22 1047 04/29/22 1433   04/29/22 1145  vancomycin (VANCOCIN) IVPB 1000 mg/200 mL premix  Status:  Discontinued        1,000 mg 200 mL/hr over 60 Minutes Intravenous  Once 04/29/22 1047 04/29/22 1055   04/29/22 1145  vancomycin (VANCOREADY) IVPB 1500 mg/300 mL  Status:  Discontinued        1,500 mg 150 mL/hr over 120 Minutes Intravenous Every 24 hours 04/29/22 1055 04/29/22 1357   04/29/22 1045  ceFEPIme (MAXIPIME) 2 g in sodium chloride 0.9 % 100 mL IVPB  Status:  Discontinued        2 g 200 mL/hr over 30 Minutes Intravenous Every 8 hours 04/29/22 0954 04/29/22 1433    04/23/22 0730  ceFAZolin (ANCEF) IVPB 2g/100 mL premix        2 g 200 mL/hr over 30 Minutes Intravenous On call to O.R. 04/23/22 [id number] 04/23/22 0828       Assessment/Plan: s/p Procedure(s): Coronary/Graft Acute MI Revascularization (N/A) LEFT HEART CATH AND CORONARY ANGIOGRAPHY (N/A) RIGHT/LEFT HEART CATH AND CORONARY ANGIOGRAPHY (N/A) Advance diet Wbc up a little 2 days ago. Will repeat since she is having more abd pain Ms. Stipes is s/p robotic incisional hernia repair on 04/23/22 - Tube feeds hat 30 - Pain medication prn - Okay for diet as deemed fit by speech therapy team - Similar situation over the weekend that happened a week or so ago - tachycardia, RUQ abdominal pain and vomiting.  CT of the abdomen negative then.  CT PE ordered over the weekend and was negative.  Pain relieved by vomiting.  Hasn't had pain like this ever before surgery.  LFTs normal, RUQ [place] Normal, Keep tube feeds at half rate for now.  On Protonix for ulcer prophylaxis.  - Speech study needed to determine if PEG may be needed.        Acute metabolic encephalopathy superimposed on hx bipolar disorder, schizophrenia   ICU delirium- improved now off all IV infusions. Acute biventricular HFrEF (Etiology sepsis, stress induced CM. Has clean coronaries) Cardiogenic shock resolved, EF improved.  Acute hypoxic respiratory failure Aspiration PNA Pulm edema   RLL VAP  AKI with oliguria; no baseline CKD - Seems to have tolerated contrast for CTPE Anemia, acute on chronic  DM2 with hyperglycemia  Severe protein calorie malnutrition  Pressure injury lip - improving  Deconditioning    Appreciate [facility] care with multiple issues   Needs therapy and recouperation     Dispo - Medically stable for discharge to SNF.  Difficult placement.  LOS: 48 days    Nickola Major Kraig Genis 06/11/2022

## 2022-06-11 NOTE — Progress Notes (Signed)
Occupational Therapy Treatment Patient Details Name: Patricia [name] MRN: [id number] DOB: March 12, 1971 Today's Date: 06/11/2022   History of present illness 51 yo female admitted 04/23/22 for excisional hernia repair with mesh. Course complicated by post-op ileus, tachycardia, PNA, sepsis. Transfer to ICU 10/2 on levophed. Worsening acidosis 10/3 requiring intubation. Cardiac cath 10/4 with EF 15%. Pt with AKI; on CRRT 10/5-10/14. Failed extubation 10/8 and 10/14; s/p trach 10/14. PMH includes depression, OSA, asthma, schizophrenia, DM, insomnia.   OT comments  Patient received in supine and agreeable to OT session. Patient required mod assist to get from sidelying to sitting on EOB with assistance to raise trunk. Patient able to perform bathing tasks seated on EOB with supervision for balance, min assist for UB and max assist for LB. Patient began feeling nauseous during visit and asked to return to supine. Patient was max assist for lateral scooting towards HOB and to assist back to supine. HR 91 and BP121/92 once back in supine. Acute OT to continue to follow.    Recommendations for follow up therapy are one component of a multi-disciplinary discharge planning process, led by the attending physician.  Recommendations may be updated based on patient status, additional functional criteria and insurance authorization.    Follow Up Recommendations  Skilled nursing-short term rehab (<3 hours/day)     Assistance Recommended at Discharge Frequent or constant Supervision/Assistance  Patient can return home with the following  Two people to help with walking and/or transfers;A lot of help with bathing/dressing/bathroom;Assistance with cooking/housework;Help with stairs or ramp for entrance;Assist for transportation   Equipment Recommendations  Other (comment) (TBD next venue)    Recommendations for Other Services      Precautions / Restrictions Precautions Precautions: Fall Precaution  Comments: trach collar, PMSV (to worn all waking hours with cuff deflated, cortrak Restrictions Weight Bearing Restrictions: No       Mobility Bed Mobility Overal bed mobility: Needs Assistance Bed Mobility: Rolling, Sidelying to Sit, Sit to Supine Rolling: Mod assist Sidelying to sit: HOB elevated, Mod assist   Sit to supine: HOB elevated, Max assist   General bed mobility comments: increased assistance to return to supine due to complaints of nausea    Transfers Overall transfer level: Needs assistance                 General transfer comment: performed visit on EOB     Balance Overall balance assessment: Needs assistance Sitting-balance support: No upper extremity supported, Feet supported Sitting balance-Leahy Scale: Good Sitting balance - Comments: able to perform bathing tasks seated on EOB with supervision, min guard when patient began feeling nauseous                                   ADL either performed or assessed with clinical judgement   ADL Overall ADL's : Needs assistance/impaired Eating/Feeding: NPO   Grooming: Wash/dry hands;Wash/dry face;Applying deodorant;Min guard;Sitting Grooming Details (indicate cue type and reason): EOB Upper Body Bathing: Sitting;Minimal assistance Upper Body Bathing Details (indicate cue type and reason): assistance with back Lower Body Bathing: Sitting/lateral leans;Maximal assistance;Moderate assistance Lower Body Bathing Details (indicate cue type and reason): able to bathe UB legs seated on EOB with assistance below knees                       General ADL Comments: performed bathing tasks seated on EOB with min guard to supervision for  balance    Extremity/Trunk Assessment              Vision       Perception     Praxis      Cognition Arousal/Alertness: Awake/alert Behavior During Therapy: WFL for tasks assessed/performed Overall Cognitive Status: Within Functional Limits for  tasks assessed                         Following Commands: Follows one step commands with increased time     Problem Solving: Slow processing, Requires verbal cues General Comments: follow commands with increased time        Exercises      Shoulder Instructions       General Comments HR 91 BP 121/92 in supine at end of session    Pertinent Vitals/ Pain       Pain Assessment Pain Assessment: Faces Faces Pain Scale: Hurts little more Pain Location: abdomen Pain Descriptors / Indicators: Grimacing, Guarding Pain Intervention(s): Limited activity within patient's tolerance, Monitored during session, Repositioned  Home Living                                          Prior Functioning/Environment              Frequency  Min 2X/week        Progress Toward Goals  OT Goals(current goals can now be found in the care plan section)  Progress towards OT goals: Progressing toward goals  Acute Rehab OT Goals Patient Stated Goal: get better OT Goal Formulation: With patient Time For Goal Achievement: 06/24/22 Potential to Achieve Goals: Good ADL Goals Pt Will Perform Grooming: with set-up;with supervision;sitting Pt Will Perform Upper Body Dressing: with set-up;with supervision;sitting Pt Will Perform Lower Body Dressing: with mod assist;with adaptive equipment;sit to/from stand Pt Will Transfer to Toilet: with min assist;with +2 assist;squat pivot transfer;bedside commode Additional ADL Goal #1: Pt will be min guard A in and OOB for basic ADLs  Plan Discharge plan remains appropriate    Co-evaluation                 AM-PAC OT "6 Clicks" Daily Activity     Outcome Measure   Help from another person eating meals?: Total Help from another person taking care of personal grooming?: A Little Help from another person toileting, which includes using toliet, bedpan, or urinal?: Total Help from another person bathing (including washing,  rinsing, drying)?: A Lot Help from another person to put on and taking off regular upper body clothing?: A Little Help from another person to put on and taking off regular lower body clothing?: Total 6 Click Score: 11    End of Session Equipment Utilized During Treatment: Oxygen (trach collar)  OT Visit Diagnosis: Other abnormalities of gait and mobility (R26.89);Muscle weakness (generalized) (M62.81)   Activity Tolerance Other (comment) (bouts of nausea)   Patient Left in bed;with call bell/phone within reach;with bed alarm set   Nurse Communication Mobility status;Other (comment) (asking for medicationfor nausea)        Time: 9937-1696 OT Time Calculation (min): 27 min  Charges: OT General Charges $OT Visit: 1 Visit OT Treatments $Self Care/Home Management : 8-22 mins $Therapeutic Activity: 8-22 mins  Lodema Hong, OTA Acute Rehabilitation Services  Office [phone]   Trixie Dredge 06/11/2022, 11:21 AM

## 2022-06-11 NOTE — Progress Notes (Addendum)
SLP Cancellation Note  Patient Details Name: Patricia [name] MRN: [id number] DOB: 06/23/1971   Cancelled MBS:  Transporter arrived to take pt to radiology for MBS but was told by staff to return in an hour.  There were no openings in the schedule after her appointment time, so it was not able to be completed.   Will continue efforts to complete this test.   Estill Bamberg L. Tivis Ringer, MA CCC/SLP Clinical Specialist - Acute Care SLP Acute Rehabilitation Services Office number [phone]    [name] 06/11/2022, 3:11 PM

## 2022-06-12 ENCOUNTER — Inpatient Hospital Stay (HOSPITAL_COMMUNITY): Payer: Medicaid Other

## 2022-06-12 DIAGNOSIS — A419 Sepsis, unspecified organism: Secondary | ICD-10-CM | POA: Diagnosis not present

## 2022-06-12 DIAGNOSIS — R6521 Severe sepsis with septic shock: Secondary | ICD-10-CM | POA: Diagnosis not present

## 2022-06-12 LAB — GLUCOSE, CAPILLARY
Glucose-Capillary: 114 mg/dL — ABNORMAL HIGH (ref 70–99)
Glucose-Capillary: 106 mg/dL — ABNORMAL HIGH (ref 70–99)
Glucose-Capillary: 113 mg/dL — ABNORMAL HIGH (ref 70–99)
Glucose-Capillary: 112 mg/dL — ABNORMAL HIGH (ref 70–99)

## 2022-06-12 LAB — RENAL FUNCTION PANEL
Albumin: 2.7 g/dL — ABNORMAL LOW (ref 3.5–5.0)
Anion gap: 11 (ref 5–15)
BUN: 25 mg/dL — ABNORMAL HIGH (ref 6–20)
CO2: 17 mmol/L — ABNORMAL LOW (ref 22–32)
Calcium: 9.5 mg/dL (ref 8.9–10.3)
Chloride: 109 mmol/L (ref 98–111)
Creatinine, Ser: 1.08 mg/dL — ABNORMAL HIGH (ref 0.44–1.00)
GFR, Estimated: >60 mL/min (ref >60)
Glucose, Bld: 100 mg/dL — ABNORMAL HIGH (ref 70–99)
Phosphorus: 4.8 mg/dL — ABNORMAL HIGH (ref 2.5–4.6)
Potassium: 4 mmol/L (ref 3.5–5.1)
Sodium: 137 mmol/L (ref 135–145)

## 2022-06-12 LAB — CBC
HCT: 30.8 % — ABNORMAL LOW (ref 36.0–46.0)
Hemoglobin: 9.5 g/dL — ABNORMAL LOW (ref 12.0–15.0)
MCH: 27.1 pg (ref 26.0–34.0)
MCHC: 30.8 g/dL (ref 30.0–36.0)
MCV: 88 fL (ref 80.0–100.0)
Platelets: 500 10*3/uL — ABNORMAL HIGH (ref 150–400)
RBC: 3.5 MIL/uL — ABNORMAL LOW (ref 3.87–5.11)
RDW: 16.9 % — ABNORMAL HIGH (ref 11.5–15.5)
WBC: 14.1 10*3/uL — ABNORMAL HIGH (ref 4.0–10.5)
nRBC: 0 % (ref 0.0–0.2)

## 2022-06-12 MED ORDER — ENSURE ENLIVE PO LIQD
237.0000 mL | Freq: Two times a day (BID) | ORAL | Status: DC
  Administered 2022-06-13 – 2022-06-17 (×3): 237 mL via ORAL

## 2022-06-12 NOTE — Progress Notes (Signed)
Nutrition Follow-up  DOCUMENTATION CODES:   Not applicable  INTERVENTION:  Continue Multivitamin w/ minerals daily Ensure Enlive po BID, each supplement provides 350 kcal and 20 grams of protein. Encourage good PO intake  Recommend obtaining new weight.  Discontinue enteral nutrition order.  Recommend holding bowel regimen due to ongoing diarrhea.   NUTRITION DIAGNOSIS:   Swallowing difficulty related to dysphagia as evidenced by NPO status (with need for MBS per SLP). - Progressing, diet advanced  GOAL:   Patient will meet greater than or equal to 90% of their needs - Progressing  MONITOR:   PO intake, Supplement acceptance, Labs, Weight trends  REASON FOR ASSESSMENT:   Consult Enteral/tube feeding initiation and management (trickle tube feeds)  ASSESSMENT:   Pt with hx of bipolar disorder, HTN, HLD, GERD, chronic bronchitis, GERD, DM type 2, and multiple prior abdominal surgeries presented to hospital for planned repair of a spigelian hernia.  09/26 - s/p robotic incisional hernia repair with mesh, extensive LOA 10/02 - transferred to ICU, pressors started, NG tube placed for decompression, TPN start 10/03 - intubated, NG tube exchanged for OG tube 10/04 - CRRT start, OG tube removed and replaced, concern for STEMI s/p L heart cath and coronary angiography without occlusion 10/08 - extubated, later re-intubated 10/09 - trickle TF initiated 10/12 - TF increased to goal of 50 10/13 - Cortrak placed (tip proximal jejunum)  10/14 - CRRT discontinued, Extubated, Re-Intubated, Trach placed 10/15 - TPN discontinued 10/16 - First iHD 10/28 - Pt continues to work with SLP (rec NPO), remains on TFs at goal 10/30 - N/V, TFs held, remains on Aerosal trach collar 11/01 - Pt refusing TFs d/t abdominal pain; recovered renal function: TDC removed  11/03 - MBS, recommending NPO  11/08 - transferred to 6N 11/15 - MBS, diet advanced to Dysphagia 3, thin liquids; decannulated  Met  with pt and family at bedside. Pt stated she was not going to have TF and that every time they start it she throws up. Refuses to have tube feeds. Pt unsure what she is throwing up, family thinks she is throwing up bile like. RD explained placement of tube and that pt should not be throwing up tube feeds. Discussed that we will await MBS results and make a plan moving forwards. Explained the importance of nutrition to prevent the lose of lean muscle mass.   After RD visit, pt has been able to advanced diet. Recommend leaving Cortrak and monitoring pt PO intake prior to removal of tube.   Medications reviewed and include: Pepcid, Nutrisource Fiber, NovoLog SSI + 4 units, MVI, Miralax, Senokot Labs reviewed: Phosphorus 4.8, 24 hr CBGs 85-121  Diet Order:   Diet Order             DIET DYS 3 Room service appropriate? Yes with Assist; Fluid consistency: Thin  Diet effective now                   EDUCATION NEEDS:   Education needs have been addressed  Skin:  Skin Assessment: Reviewed RN Assessment Skin Integrity Issues:: Stage I DTI: upper lip from ET tube Stage I: heel  Last BM:  11/14 - Type 7  Height:   Ht Readings from Last 1 Encounters:  05/22/22 _0  (1.626 m)    Weight:   Wt Readings from Last 1 Encounters:  06/12/22 (!) 161.8 kg    Ideal Body Weight:  54.5 kg  BMI:  Body mass index is 61.23 kg/m.  Estimated Nutritional Needs:  Kcal:  1900-2200 kcal Protein:  80-110 g Fluid:  1900-2000 mL    Hermina Barters RD, LDN Clinical Dietitian See [facility] for contact information.

## 2022-06-12 NOTE — Progress Notes (Signed)
Patient transported to fluro via bed by transportation staff.

## 2022-06-12 NOTE — Progress Notes (Signed)
Per Nadara Mode ccc SLP he did repeat Modified barium test and patient did great and her voice sounds good with the trach capped. He is recommending Dys 3 (mechanical soft) solids and thin liquids, meds whole in puree and no straws

## 2022-06-12 NOTE — Progress Notes (Signed)
42 Days Post-Op   Subjective/Chief Complaint: Vomited and having bowel movements.   Objective: Vital signs in last 24 hours: Temp:  [97.5 F (36.4 C)-98.7 F (37.1 C)] 98.1 F (36.7 C) (11/15 0757) Pulse Rate:  [88-126] 98 (11/15 0757) Resp:  [14-20] 16 (11/15 0757) BP: (109-134)/(56-99) 109/83 (11/15 0757) SpO2:  [96 %-100 %] 100 % (11/15 0757) FiO2 (%):  [28 %] 28 % (11/14 1155) Weight:  [161.8 kg] 161.8 kg (11/15 0500) Last BM Date : 06/08/22  Intake/Output from previous day: 11/14 0701 - 11/15 0700 In: 1000 [NG/GT:1000] Out: 900 [Urine:900] Intake/Output this shift: No intake/output data recorded.  General appearance: alert and cooperative Resp: clear to auscultation bilaterally Cardio: regular rate and rhythm GI: soft, tender on right. Tube feeds at goal  Lab Results:  Recent Labs    06/11/22 0246 06/12/22 0407  WBC 9.4 14.1*  HGB 9.4* 9.5*  HCT 30.9* 30.8*  PLT 454* 500*    BMET Recent Labs    06/11/22 1401 06/12/22 0407  NA 137 137  K 4.5 4.0  CL 108 109  CO2 18* 17*  GLUCOSE 111* 100*  BUN 32* 25*  CREATININE 0.92 1.08*  CALCIUM 9.7 9.5    PT/INR No results for input(s): "LABPROT", "INR" in the last 72 hours. ABG No results for input(s): "PHART", "HCO3" in the last 72 hours.  Invalid input(s): "PCO2", "PO2"  Studies/Results: US Abdomen Limited RUQ (LIVER/GB)  Result Date: 06/10/2022 CLINICAL DATA:  Right upper quadrant pain EXAM: ULTRASOUND ABDOMEN LIMITED RIGHT UPPER QUADRANT COMPARISON:  CT 05/29/2022, ultrasound 08/16/2021 FINDINGS: Gallbladder: No gallstones or wall thickening visualized. No sonographic Murphy sign noted by sonographer. Common bile duct: Diameter: 4 mm. Liver: No focal lesion identified. Within normal limits in parenchymal echogenicity. Portal vein is patent on color Doppler imaging with normal direction of blood flow towards the liver. Other: None. IMPRESSION: Unremarkable right upper quadrant ultrasound.  Electronically Signed   By: Davina Poke D.O.   On: 06/10/2022 16:46    Anti-infectives: Anti-infectives (From admission, onward)    Start     Dose/Rate Route Frequency Ordered Stop   06/04/22 2100  ceFAZolin (ANCEF) IVPB 2g/100 mL premix        2 g 200 mL/hr over 30 Minutes Intravenous Every 8 hours 06/04/22 1103 06/05/22 2037   06/01/22 1515  ceFEPIme (MAXIPIME) 2 g in sodium chloride 0.9 % 100 mL IVPB  Status:  Discontinued        2 g 200 mL/hr over 30 Minutes Intravenous Every 12 hours 06/01/22 1420 06/04/22 1103   05/15/22 1445  ceFAZolin (ANCEF) IVPB 2g/100 mL premix       Note to Pharmacy: To be given in IR   2 g 200 mL/hr over 30 Minutes Intravenous  Once 05/15/22 1352 05/15/22 1616   05/15/22 1230  ceFAZolin (ANCEF) powder 1 g  Status:  Discontinued        1 g Other To Surgery 05/15/22 1140 05/15/22 1239   05/15/22 1230  ceFAZolin (ANCEF) IVPB 2g/100 mL premix        2 g 200 mL/hr over 30 Minutes Intravenous To Radiology 05/15/22 1140 05/15/22 1140   05/15/22 0000  ceFAZolin (ANCEF) IVPB 2g/100 mL premix        2 g 200 mL/hr over 30 Minutes Intravenous To Radiology 05/14/22 1308 05/15/22 0005   05/13/22 2134  meropenem (MERREM) 500 mg in sodium chloride 0.9 % 100 mL IVPB        500 mg  200 mL/hr over 30 Minutes Intravenous Every 24 hours 05/13/22 0815 05/17/22 0700   05/13/22 1000  meropenem (MERREM) 500 mg in sodium chloride 0.9 % 100 mL IVPB  Status:  Discontinued        500 mg 200 mL/hr over 30 Minutes Intravenous Every 12 hours 05/12/22 0940 05/12/22 0946   05/12/22 2200  meropenem (MERREM) 500 mg in sodium chloride 0.9 % 100 mL IVPB  Status:  Discontinued        500 mg 200 mL/hr over 30 Minutes Intravenous Every 12 hours 05/12/22 0946 05/13/22 0815   05/12/22 2000  meropenem (MERREM) 1 g in sodium chloride 0.9 % 100 mL IVPB  Status:  Discontinued        1 g 200 mL/hr over 30 Minutes Intravenous  Once 05/12/22 0940 05/12/22 0946   05/06/22 1530  meropenem (MERREM)  1 g in sodium chloride 0.9 % 100 mL IVPB  Status:  Discontinued        1 g 200 mL/hr over 30 Minutes Intravenous Every 8 hours 05/06/22 1438 05/12/22 0940   05/04/22 1000  vancomycin (VANCOCIN) IVPB 1000 mg/200 mL premix  Status:  Discontinued        1,000 mg 200 mL/hr over 60 Minutes Intravenous Every 24 hours 05/03/22 0802 05/08/22 0913   05/03/22 0845  vancomycin (VANCOREADY) IVPB 2000 mg/400 mL        2,000 mg 200 mL/hr over 120 Minutes Intravenous  Once 05/03/22 0751 05/03/22 1114   05/01/22 1800  piperacillin-tazobactam (ZOSYN) IVPB 3.375 g  Status:  Discontinued        3.375 g 12.5 mL/hr over 240 Minutes Intravenous Every 6 hours 05/01/22 1426 05/01/22 1427   05/01/22 1515  piperacillin-tazobactam (ZOSYN) IVPB 3.375 g  Status:  Discontinued        3.375 g 100 mL/hr over 30 Minutes Intravenous Every 6 hours 05/01/22 1428 05/06/22 1438   04/30/22 1400  piperacillin-tazobactam (ZOSYN) IVPB 2.25 g  Status:  Discontinued        2.25 g 100 mL/hr over 30 Minutes Intravenous Every 8 hours 04/30/22 1325 05/01/22 1426   04/29/22 1530  piperacillin-tazobactam (ZOSYN) IVPB 3.375 g  Status:  Discontinued        3.375 g 12.5 mL/hr over 240 Minutes Intravenous Every 8 hours 04/29/22 1433 04/30/22 1325   04/29/22 1145  metroNIDAZOLE (FLAGYL) IVPB 500 mg  Status:  Discontinued        500 mg 100 mL/hr over 60 Minutes Intravenous Every 12 hours 04/29/22 1047 04/29/22 1433   04/29/22 1145  vancomycin (VANCOCIN) IVPB 1000 mg/200 mL premix  Status:  Discontinued        1,000 mg 200 mL/hr over 60 Minutes Intravenous  Once 04/29/22 1047 04/29/22 1055   04/29/22 1145  vancomycin (VANCOREADY) IVPB 1500 mg/300 mL  Status:  Discontinued        1,500 mg 150 mL/hr over 120 Minutes Intravenous Every 24 hours 04/29/22 1055 04/29/22 1357   04/29/22 1045  ceFEPIme (MAXIPIME) 2 g in sodium chloride 0.9 % 100 mL IVPB  Status:  Discontinued        2 g 200 mL/hr over 30 Minutes Intravenous Every 8 hours 04/29/22  0954 04/29/22 1433   04/23/22 0730  ceFAZolin (ANCEF) IVPB 2g/100 mL premix        2 g 200 mL/hr over 30 Minutes Intravenous On call to O.R. 04/23/22 4431 04/23/22 5400       Assessment/Plan: s/p Procedure(s): Coronary/Graft Acute MI Revascularization (  N/A) LEFT HEART CATH AND CORONARY ANGIOGRAPHY (N/A) RIGHT/LEFT HEART CATH AND CORONARY ANGIOGRAPHY (N/A) Advance diet Wbc up a little 2 days ago. Will repeat since she is having more abd pain Ms. Mcglaun is s/p robotic incisional hernia repair on 04/23/22 - Refused tube feeds overnight, wants to eat - Need to get swallow eval study done today - I anticipate she would refuse a PEG tube so hopefully she passes and we can advance her diet. - Pain medication prn    Acute metabolic encephalopathy superimposed on hx bipolar disorder, schizophrenia   ICU delirium- improved now off all IV infusions. Acute biventricular HFrEF (Etiology sepsis, stress induced CM. Has clean coronaries) Cardiogenic shock resolved, EF improved.  Acute hypoxic respiratory failure Aspiration PNA Pulm edema   RLL VAP  AKI with oliguria; no baseline CKD - Seems to have tolerated contrast for CTPE Anemia, acute on chronic  DM2 with hyperglycemia  Severe protein calorie malnutrition  Pressure injury lip - improving  Deconditioning    Appreciate [facility] care with multiple issues   Needs therapy and recouperation     Dispo - Medically stable for discharge to SNF.  LOS: 49 days    Nickola Major Eulah Walkup 06/12/2022

## 2022-06-12 NOTE — Progress Notes (Signed)
NAME:  Patricia [name], MRN:  [id number], DOB:  Feb 08, 1971, LOS: 30 ADMISSION DATE:  04/23/2022, CONSULTATION DATE:  04/29/2022 REFERRING MD:  Felicie Morn, MD, CHIEF COMPLAINT:   Small bowel obstruction, aspiration event, sepsis, respiratory failure    History of Present Illness:  Patricia [name] with above hx and nuc study in 2015 neg for ischemia done for chest pain and echo with EF 60-65% presented to hospital for excisional hernia repair (robotic hernia repair 04/23/22) with mesh, ileus post op on TNA. Developed vomiting and hypotension after NG came out on 04/29/22 and PNA on CXR, was tachycardic and BP improved with IV fluids. DX with sepsis and ABX started. At one point prolonged Qtc. Pt admitted to ICU on 10/2.  Was placed on levophed. Early AM on 10/3 was intubated for worsening metabolic/lactic acidosis. She had AKI as well. Crt was 6.01, her normal 0.9. Today Cr is 4 on CRRT.  Pertinent  Medical History  DM2 Depression OSA Migraines  Asthma Obesity Schizophrenia HTN HLD  Significant Hospital Events: Including procedures, antibiotic start and stop dates in addition to other pertinent events   9/26 Robotic hernia repair complicated by abd adhesions.  10/2 CT + for developing SBO and bilat lower lobe opacities most likely aspiration PNA. Progressive decompensation requiring transfer to ICU 10/2 PCCM consulted for respiratory failure & shock 10\3 Intubated for resp failure. CVL and arterial line placed. 04/30/22: EF 30-35%, RV moderately reduced, mild to moderate MR, IVC collapsed throughout respiratory cycle 10\4 EKG changes/trop increased--> and taken to cath lab - No obstructive CAD, but in decompensated heart failure, Bedside echo with EF 15%, new WMA, moderate RV dysfunction, trivial pericardial effusion.underwent emergent L/RHC. Normal coronaries, RA mean 13, PA mean 29, LVEDP 16, PCWP 19, CO/CI 3.96/1.87   returned to CICU post-cath. Right heart cath placed right fem.  Right IJ HD cath placed.  10/5 advanced HF team following, shock supported on norepi/vasopressin and Dobutamine. On CRRT.  10/6 weaning Norepi. Starting to wean fent. Needing restraints.  10/8: Reintubated due to neuromuscular weakness.  Bedside echo demonstrating improved RV function, LV function remained depressed 10/9 CT abdomen pelvis.suggesting wall thickening throughout the proximal and mid small bowel consistent with possible infectious or inflammatory enteritis there is also bibasilar airspace disease which could reflect pneumonia.  Antibiotic coverage widened.  Zosyn changed to meropenem.  Tolerating trickle feeds still on TPN 10/10 stopped stress dose steroids.  Resumed subcutaneous heparin. 10/13 albumin for coox decrease. Weaning vent . Failed SBT. At goal TF. 10/14 extubated to bipap failed due to weakness, trached 10/15 trial off CRRT, lasix 10/18 RIJ permcath 10/22 transfer 4N as a lateral after aspiration event briefly requiring mechanical ventilation 10/25 transfer to [facility] progressive  11/1 TDC removed 11/13 swallow eval (cancelled) 11/14 trach down-sized to 4 cuffless. Capping trials started.  11/15: Tracheostomy removed Interim History / Subjective:  Passed swallow eval  Objective   Blood pressure 109/83, pulse (Abnormal) 102, temperature 98.1 F (36.7 C), temperature source Oral, resp. rate 16, height '5\' 4"'$  (1.626 m), weight (Abnormal) 161.8 kg, last menstrual period 07/07/2014, SpO2 98 %.    FiO2 (%):  [28 %] 28 %   Intake/Output Summary (Last 24 hours) at 06/12/2022 1038 Last data filed at 06/12/2022 0038 Gross per 24 hour  Intake 1000 ml  Output 500 ml  Net 500 ml   Filed Weights   06/04/22 0500 06/05/22 0500 06/12/22 0500  Weight: 78.7 kg 74 kg (Abnormal) 161.8 kg  Examination: General 51 year old female patient resting in bed status post tracheostomy removed tolerated this well HEENT normocephalic atraumatic tracheostomy dressing in place Pulmonary:  Clear to auscultation Cardiac: Regular rate and rhythm Abdomen: Soft nontender Neuro: Awake oriented no focal deficits  Resolved problem list   Aspiration PNA Cardiogenic shock  Acute hypoxic respiratory failure  Prolonged mechanical ventilation sp tracheostomy Pulmonary edema   Assessment & Plan:   Acute metabolic encephalopathy superimposed on hx bipolar disorder, schizophrenia   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 Has  AKI due to septic ATN, no renal recovery S/p hernia repair &  LOA inflammatory enteritis  Anemia, acute on chronic  Acute urinary retention DM2 with hyperglycemia  Severe protein calorie malnutrition  Morbid obesity Pressure injury- lip in the setting of ET tube: Not present on admission  Pulm problem list: Acute respiratory failure with hypoxia Tracheostomy dependence  dysphagia   Discussion: Down sized yesterday. Initiated capping trials. I have just decannulated her Plan: Keep current tracheostomy dressing in place for 48 hours Encourage her to splint tracheostomy stoma site when coughing Continue diet as ordered Follow-up as needed. If tracheostomy stoma has not closed after 10 to 14 days would need ENT referral for tracheocutaneous fistula   Patricia [name] ACNP-BC [place] Pager # [phone] OR # [phone] if no answer   Best Practice (right click and "Reselect all SmartList Selections" daily)

## 2022-06-12 NOTE — Progress Notes (Signed)
Modified Barium Swallow Progress Note  Patient Details  Name: Patricia [name] MRN: [id number] Date of Birth: 05-Jul-1971  Today's Date: 06/12/2022  Modified Barium Swallow completed.  Full report located under Chart Review in the Imaging Section.  Brief recommendations include the following:  Clinical Impression  Patient presents with significantly improved swallow function as compared to previous MBS (05/31/22). During today's MBS, patient has trach capped and has Cortrak feeding tube in place. She did not exhibit any significant oral phase impairments with any of the tested barium consistencies: nectar thick, thin, pudding, 65m barium tablet, mechanical soft solids). She was impulsive with all PO intake and suspect this is somewhat baseline. During pharyngeal phase of swallow, patient exhibited swallow initiation delay to level of vallecular sinus with puree solids, mechanical soft solids and swallow initiation delays at level of pyriform sinus with nectar thick liquids and thin liquids. She exhibited one instance of very trace penetration to the vocal cords when drinking large volume of thin liquids bolus via straw sips but no penetration or aspiration observed with cup sips of thin liquids. No penetration, aspiration observed with nectar thick liquids, puree or mechanical soft solids. No observed pharyngeal residuals with any of the tested barium consistencies. 180mbarium tablet taken with puree transited pharyngeally and through cervical esophagus without difficulty. SLP is recommending initiate PO diet of Dys 3 (mechanical soft) solids and thin liquids, no straws and meds whole with puree. SLP will follow for diet toleration and ability to upgrade.   Swallow Evaluation Recommendations       SLP Diet Recommendations: Dysphagia 3 (Mech soft) solids;Thin liquid   Liquid Administration via: No straw;Cup   Medication Administration: Whole meds with puree   Supervision: Patient able to  self feed;Full supervision/cueing for compensatory strategies   Compensations: Slow rate;Small sips/bites   Postural Changes: Seated upright at 90 degrees          JoSonia BallerMA, CCC-SLP Speech Therapy

## 2022-06-12 NOTE — Progress Notes (Signed)
PROGRESS NOTE    Patricia [name]  QXI:[id number] DOB: [birth year] DOA: 04/23/2022 PCP: [name], MD   Brief Narrative:   51 year old with history of DM2, depression, migraine, asthma, schizophrenia, HTN, HLD presented to the hospital for exertional hernia, robotic hernia repair on 04/23/2022 with mesh who developed postop ileus and is on TNA.  Also patient developed vomiting and hypotension after NG tube came out on 04/29/2022 with concerns of pneumonia which was treated with antibiotics and IV fluids.  During that time patient also required Levophed and eventually intubated on 10/3 for worsening metabolic acidosis complicated by acute kidney injury.  Echocardiogram showed depressed EF of 30-35% with elevated troponin.  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  Patient was seen by CHF team started on dopamine and CRRT as well.  Patient was again reintubated due to neuromuscular weakness on 10/8.  Eventually extubated on 10/14 to BiPAP and trach was placed due to weakness.  Tube feeds held due to nausea vomiting on 10/30.  Hospital course complicated by hypernatremia, she developed AKI, requiring brief course of HD, renal function has recovered. Dysphagia-on tube feeds via cortrak -Weekend 11/11-12 with chest pain and tachycardia, CTA chest negative for PE, symptoms resolved -Trach exchanged, downsized by [facility] 11/14 -11/14, transient run of V. tach  Subjective: -Swallow eval could not be completed yesterday afternoon -Feels okay overall,  Assessment and Plan:  Septic shock ([place]) secondary to aspiration pneumonia, right lower lobe Acute respiratory failure with prolonged mechanical ventilation status post tracheostomy -Trach management by pulmonary team. -Bronchodilators as needed, supportive care.  Out of bed to chair -tolerating PMV -Complicated by aspiration pneumonia in early November, sputum culture grew Klebsiella, completed antibiotics on 11/8 -Patient  failed MBS last week, remains n.p.o., only ice chips, feeding via cortrak only, SLP following, plan for repeat MBS today, hopefully can be started on a diet -Pulmonary following, trach downsized yesterday-11/14, plan to start capping trials  Status post hernia repair complicated by postop ileus/inflammatory enteritis Intermittent nausea vomiting -Management by general surgery.  She is working with speech and swallow.  Remains n.p.o., PMV.  Tube feeds, SLP following for dysphagia progression -Management per primary surgical team -abdominal symptoms stable  Chest pain, tachycardia -resolved, no ACS, CTA negative for PE  Hypernatremia - resolved, if sodium starts to increase then will start on D5W , as surgery would like to avoid excessive free water via cortrak, as it usually causes nausea, vomiting and abdominal pain .  AKI -Likely due to ATN in the setting of cardiogenic/septic shock, required CRRT 10/410/14, first HD on 10/16, renal function has recovered, stable off HD  Biventricular failure, reduced EF -Had BiV failure in the setting of shock, stress cardiomyopathy suspected, LHC with clean: Coronaries, now euvolemic, last seen by CHF team 10/16.  -Repeat echo 05/10/2022 with recovery now EF 50-55% with normal WMA -Stable, remains euvolemic  -Follow-up with cards as outpatient -Short run of V. tach yesterday, continue metoprolol, monitor electrolytes intermittently, K >4 and mag > 2 keep on telemetry for few more days -Continue metoprolol  Sinus tachycardia; improved.  - Now on metoprolol 50 mg twice daily.  IV as needed  Anemia of chronic disease - From underlying multiple medical issues.  Transfuse as necessary and attempt to keep hemoglobin greater than 8  Essential hypertension -Currently on metoprolol twice daily  Chronic low back pain -As needed pain control  Morbid obesity (HCC) -Body mass index is 41.37 kg/m..  Currently on tube feeds -Follow-up  outpatient, weight  loss diet and exercise  BIPOLAR DISORDER UNSPECIFIED -On Klonopin.  Should slowly resume home meds as appropriate  Diabetes mellitus type 2 in obese (HCC) -Sliding scale and Accu-Cheks. -stable  Pressure area on the lip in the setting of ET tube placement which was not present prior to admission.  Routine wound care.  PT-CIR   DVT prophylaxis: Subcu heparin Code Status: Full code Family Communication:  none at bedside Final dispo per general surgery  Nutritional status  Signs/Symptoms: NPO status (with need for MBS per SLP)  Interventions: Tube feeding, Prostat, MVI  Body mass index is 61.23 kg/m.  Examination:  Obese chronically ill female sitting up in bed, AAOx3 HEENT: No JVD , cortrak and trach collar noted CVS: S1-S2, regular rhythm Lungs: Decreased breath sounds the bases, occasional scattered rhonchi Abdomen: Soft, nontender, bowel sounds present  Ext: no edema Skin: no new rashes on exposed skin    Objective: Vitals:   06/12/22 0500 06/12/22 0601 06/12/22 0757 06/12/22 0910  BP:  123/85 109/83   Pulse:  99 98 (!) 102  Resp:   16 16  Temp:  (!) 97.5 F (36.4 C) 98.1 F (36.7 C)   TempSrc:  Oral Oral   SpO2:  100% 100% 98%  Weight: (!) 161.8 kg     Height:        Intake/Output Summary (Last 24 hours) at 06/12/2022 0929 Last data filed at 06/12/2022 0038 Gross per 24 hour  Intake 1000 ml  Output 900 ml  Net 100 ml     Filed Weights   06/04/22 0500 06/05/22 0500 06/12/22 0500  Weight: 78.7 kg 74 kg (!) 161.8 kg     Data Reviewed:   CBC: Recent Labs  Lab 06/07/22 0949 06/09/22 0726 06/10/22 0345 06/11/22 0246 06/12/22 0407  WBC 10.9* 16.4* 13.1* 9.4 14.1*  HGB 9.5* 9.6* 9.0* 9.4* 9.5*  HCT 31.4* 31.1* 28.5* 30.9* 30.8*  MCV 90.5 90.4 87.2 90.4 88.0  PLT 482* 418* 424* 454* 784*   Basic Metabolic Panel: Recent Labs  Lab 06/08/22 0142 06/09/22 0729 06/10/22 0345 06/11/22 0246 06/11/22 1401 06/12/22 0407  NA 134* 137 135 137  137 137  K 4.5 5.1 4.4 4.1 4.5 4.0  CL 109 107 104 108 108 109  CO2 17* 18* 19* 18* 18* 17*  GLUCOSE 126* 128* 111* 105* 111* 100*  BUN 27* 25* 26* 30* 32* 25*  CREATININE 0.87 1.06* 0.94 1.06* 0.92 1.08*  CALCIUM 9.2 9.6 9.6 9.6 9.7 9.5  MG  --   --   --   --  1.9  --   PHOS 4.7* 5.1* 4.6 5.4*  --  4.8*   GFR: Estimated Creatinine Clearance: 93.8 mL/min (A) (by C-G formula based on SCr of 1.08 mg/dL (H)). Liver Function Tests: Recent Labs  Lab 06/08/22 0142 06/09/22 0729 06/10/22 0345 06/11/22 0246 06/12/22 0407  AST  --   --  28  --   --   ALT  --   --  29  --   --   ALKPHOS  --   --  104  --   --   BILITOT  --   --  0.2*  --   --   PROT  --   --  8.0  --   --   ALBUMIN 2.5* 2.6* 2.6*  2.6* 2.7* 2.7*   Recent Labs  Lab 06/10/22 0345  LIPASE 104*   No results for input(s): "AMMONIA" in the last  168 hours. Coagulation Profile: No results for input(s): "INR", "PROTIME" in the last 168 hours. Cardiac Enzymes: No results for input(s): "CKTOTAL", "CKMB", "CKMBINDEX", "TROPONINI" in the last 168 hours. BNP (last 3 results) No results for input(s): "PROBNP" in the last 8760 hours. HbA1C: No results for input(s): "HGBA1C" in the last 72 hours. CBG: Recent Labs  Lab 06/11/22 1139 06/11/22 1607 06/11/22 2057 06/12/22 0225 06/12/22 0645  GLUCAP 121* 85 104* 114* 106*   Lipid Profile: No results for input(s): "CHOL", "HDL", "LDLCALC", "TRIG", "CHOLHDL", "LDLDIRECT" in the last 72 hours. Thyroid Function Tests: No results for input(s): "TSH", "T4TOTAL", "FREET4", "T3FREE", "THYROIDAB" in the last 72 hours. Anemia Panel: No results for input(s): "VITAMINB12", "FOLATE", "FERRITIN", "TIBC", "IRON", "RETICCTPCT" in the last 72 hours. Sepsis Labs: No results for input(s): "PROCALCITON", "LATICACIDVEN" in the last 168 hours.  No results found for this or any previous visit (from the past 240 hour(s)).    Scheduled Meds:  acetaminophen (TYLENOL) oral liquid 160 mg/5 mL   650 mg Per Tube Q4H   Chlorhexidine Gluconate Cloth  6 each Topical Daily   famotidine  20 mg Per Tube BID   feeding supplement (PROSource TF20)  60 mL Per Tube Daily   fiber  1 packet Per Tube TID   gabapentin  200 mg Per Tube Q8H   heparin injection (subcutaneous)  5,000 Units Subcutaneous Q8H   insulin aspart  0-20 Units Subcutaneous Q6H   insulin aspart  4 Units Subcutaneous Q6H   metoprolol tartrate  50 mg Per Tube BID   multivitamin with minerals  1 tablet Per Tube Daily   nystatin   Topical TID   mouth rinse  15 mL Mouth Rinse 4 times per day   polyethylene glycol  17 g Oral Daily   QUEtiapine  25 mg Per Tube QHS   senna  1 tablet Per Tube Daily   sodium chloride flush  10-40 mL Intracatheter Q12H   Continuous Infusions:  feeding supplement (PEPTAMEN 1.5 CAL) 1,000 mL (06/09/22 1716)   methocarbamol (ROBAXIN) IV 500 mg (06/08/22 2149)     LOS: 49 days    Domenic Polite, MD [facility]  If 7PM-7AM, please contact night-coverage  06/12/2022, 9:29 AM

## 2022-06-12 NOTE — Progress Notes (Signed)
Physical Therapy Treatment Patient Details Name: Patricia [name] MRN: [id number] DOB: [birth year] Today's Date: 06/12/2022   History of Present Illness 50 yo female admitted 04/23/22 for excisional hernia repair with mesh. Course complicated by post-op ileus, tachycardia, PNA, sepsis. Transfer to ICU 10/2 on levophed. Worsening acidosis 10/3 requiring intubation. Cardiac cath 10/4 with EF 15%. Pt with AKI; on CRRT 10/5-10/14. Failed extubation 10/8 and 10/14; s/p trach 10/14. PMH includes depression, OSA, asthma, schizophrenia, DM, insomnia.    PT Comments    Pt  slowly progressing toward goals, but eager to work on challenging tasks.  Emphasis on transition to sitting, scooting to EOB, sit to stand in the STEDY with stress on full upright stance with pelvis tucked in.  Pt deferred transfer to the chair today.   Recommendations for follow up therapy are one component of a multi-disciplinary discharge planning process, led by the attending physician.  Recommendations may be updated based on patient status, additional functional criteria and insurance authorization.  Follow Up Recommendations  Skilled nursing-short term rehab (<3 hours/day) Can patient physically be transported by private vehicle: No   Assistance Recommended at Discharge Frequent or constant Supervision/Assistance  Patient can return home with the following Two people to help with walking and/or transfers;Two people to help with bathing/dressing/bathroom;Assistance with cooking/housework;Assistance with feeding;Assist for transportation;Help with stairs or ramp for entrance   Equipment Recommendations  Hospital bed;Wheelchair (measurements PT);Wheelchair cushion (measurements PT);Other (comment) (TBD)    Recommendations for Other Services       Precautions / Restrictions Precautions Precautions: Fall Precaution Comments: trach collar, PMSV (to worn all waking hours with cuff deflated, cortrak     Mobility  Bed  Mobility Overal bed mobility: Needs Assistance Bed Mobility: Rolling, Sidelying to Sit Rolling: Mod assist Sidelying to sit: Mod assist (HOB minimally elevated)     Sit to sidelying: Mod assist General bed mobility comments: cues for best technique.  truncal and LE assist as needed.    Transfers Overall transfer level: Needs assistance Equipment used: Ambulation equipment used Transfers: Sit to/from Stand Sit to Stand: Mod assist, Max assist           General transfer comment: sit to stand x4, 2 from bed height and 2 from STEDY height.with face to face assist and sheet for assist    Ambulation/Gait               General Gait Details: unable   Stairs             Wheelchair Mobility    Modified Rankin (Stroke Patients Only)       Balance Overall balance assessment: Needs assistance   Sitting balance-Leahy Scale: Good     Standing balance support: Bilateral upper extremity supported, During functional activity Standing balance-Leahy Scale: Zero Standing balance comment: use STEDY and worked on full upright posture with each trials                            Cognition Arousal/Alertness: Awake/alert Behavior During Therapy: WFL for tasks assessed/performed Overall Cognitive Status: Within Functional Limits for tasks assessed                                          Exercises General Exercises - Lower Extremity Long Arc Quad: Both, 10 reps, Seated, Other (comment) (with attempt to pull feet back to  reset position at 90 degs) Straight Leg Raises: AAROM, Both, 10 reps, Supine Other Exercises Other Exercises: bil hip/knee flex/ext with graded assist (flex)/resistance (ext) x 10 reps.    General Comments        Pertinent Vitals/Pain Pain Assessment Pain Assessment: Faces Faces Pain Scale: Hurts a little bit Pain Location: joints with ROM, abdomen Pain Descriptors / Indicators: Grimacing, Guarding Pain  Intervention(s): Monitored during session    Home Living                          Prior Function            PT Goals (current goals can now be found in the care plan section) Acute Rehab PT Goals Patient Stated Goal: to get better PT Goal Formulation: With patient Time For Goal Achievement: 06/19/22 Potential to Achieve Goals: Fair Progress towards PT goals: Progressing toward goals    Frequency    Min 3X/week      PT Plan Current plan remains appropriate    Co-evaluation              AM-PAC PT "6 Clicks" Mobility   Outcome Measure  Help needed turning from your back to your side while in a flat bed without using bedrails?: A Lot Help needed moving from lying on your back to sitting on the side of a flat bed without using bedrails?: A Lot Help needed moving to and from a bed to a chair (including a wheelchair)?: Total Help needed standing up from a chair using your arms (e.g., wheelchair or bedside chair)?: Total Help needed to walk in hospital room?: Total Help needed climbing 3-5 steps with a railing? : Total 6 Click Score: 8    End of Session Equipment Utilized During Treatment: Gait belt Activity Tolerance: Patient tolerated treatment well;Patient limited by fatigue Patient left: in bed;with call bell/phone within reach;with family/visitor present Nurse Communication: Mobility status PT Visit Diagnosis: Muscle weakness (generalized) (M62.81);Difficulty in walking, not elsewhere classified (R26.2);Other abnormalities of gait and mobility (R26.89);Unsteadiness on feet (R26.81)     Time: 7858-8502 PT Time Calculation (min) (ACUTE ONLY): 42 min  Charges:  $Therapeutic Exercise: 8-22 mins $Therapeutic Activity: 23-37 mins                     06/12/2022  Ginger Carne., PT Acute Rehabilitation Services [phone]  (office)   Patricia [name] 06/12/2022, 5:05 PM

## 2022-06-12 NOTE — Progress Notes (Signed)
Patient arrived back to [place] room 28 alert and oriented . Pain level 4/10/ bed in lowest position call light in reach. Will continue to monitor pt.

## 2022-06-12 NOTE — Progress Notes (Signed)
Pt instructed to splint trach area when coughing now that trach has been removed. Patient verbalized understanding.

## 2022-06-12 NOTE — Progress Notes (Signed)
Patient was decannulated by Jerrye Bushy, NP at 1400.  Patients vitals are stable. Patient in no distress at this time.

## 2022-06-13 DIAGNOSIS — A419 Sepsis, unspecified organism: Secondary | ICD-10-CM | POA: Diagnosis not present

## 2022-06-13 DIAGNOSIS — R739 Hyperglycemia, unspecified: Secondary | ICD-10-CM

## 2022-06-13 DIAGNOSIS — J9601 Acute respiratory failure with hypoxia: Secondary | ICD-10-CM | POA: Diagnosis not present

## 2022-06-13 DIAGNOSIS — R6521 Severe sepsis with septic shock: Secondary | ICD-10-CM | POA: Diagnosis not present

## 2022-06-13 LAB — RENAL FUNCTION PANEL
Albumin: 2.6 g/dL — ABNORMAL LOW (ref 3.5–5.0)
Anion gap: 11 (ref 5–15)
BUN: 24 mg/dL — ABNORMAL HIGH (ref 6–20)
CO2: 19 mmol/L — ABNORMAL LOW (ref 22–32)
Calcium: 9.5 mg/dL (ref 8.9–10.3)
Chloride: 106 mmol/L (ref 98–111)
Creatinine, Ser: 1.15 mg/dL — ABNORMAL HIGH (ref 0.44–1.00)
GFR, Estimated: 57 mL/min — ABNORMAL LOW (ref >60)
Glucose, Bld: 100 mg/dL — ABNORMAL HIGH (ref 70–99)
Phosphorus: 4.8 mg/dL — ABNORMAL HIGH (ref 2.5–4.6)
Potassium: 3.7 mmol/L (ref 3.5–5.1)
Sodium: 136 mmol/L (ref 135–145)

## 2022-06-13 LAB — GLUCOSE, CAPILLARY
Glucose-Capillary: 113 mg/dL — ABNORMAL HIGH (ref 70–99)
Glucose-Capillary: 171 mg/dL — ABNORMAL HIGH (ref 70–99)

## 2022-06-13 LAB — HEMOGLOBIN A1C
Hgb A1c MFr Bld: 5 % (ref 4.8–5.6)
Mean Plasma Glucose: 96.8 mg/dL

## 2022-06-13 MED ORDER — QUETIAPINE FUMARATE 50 MG PO TABS
25.0000 mg | ORAL_TABLET | Freq: Every day | ORAL | Status: DC
  Administered 2022-06-13 – 2022-06-20 (×8): 25 mg via ORAL
  Filled 2022-06-13 (×8): qty 1

## 2022-06-13 MED ORDER — PANTOPRAZOLE SODIUM 40 MG PO TBEC
40.0000 mg | DELAYED_RELEASE_TABLET | Freq: Every day | ORAL | Status: DC
  Administered 2022-06-13 – 2022-06-21 (×9): 40 mg via ORAL
  Filled 2022-06-13 (×9): qty 1

## 2022-06-13 MED ORDER — ADULT MULTIVITAMIN W/MINERALS CH
1.0000 | ORAL_TABLET | Freq: Every day | ORAL | Status: DC
  Administered 2022-06-14 – 2022-06-21 (×8): 1 via ORAL
  Filled 2022-06-13 (×8): qty 1

## 2022-06-13 MED ORDER — ACETAMINOPHEN 325 MG PO TABS
650.0000 mg | ORAL_TABLET | Freq: Four times a day (QID) | ORAL | Status: DC | PRN
  Administered 2022-06-20: 650 mg via ORAL
  Filled 2022-06-13: qty 2

## 2022-06-13 MED ORDER — METOPROLOL TARTRATE 50 MG PO TABS
50.0000 mg | ORAL_TABLET | Freq: Two times a day (BID) | ORAL | Status: DC
  Administered 2022-06-13 – 2022-06-14 (×2): 50 mg via ORAL
  Filled 2022-06-13 (×2): qty 1

## 2022-06-13 MED ORDER — DIPHENHYDRAMINE HCL 25 MG PO CAPS
25.0000 mg | ORAL_CAPSULE | ORAL | Status: DC | PRN
  Administered 2022-06-13 – 2022-06-19 (×19): 25 mg via ORAL
  Filled 2022-06-13 (×19): qty 1

## 2022-06-13 MED ORDER — HYDROMORPHONE HCL 1 MG/ML IJ SOLN
0.5000 mg | INTRAMUSCULAR | Status: DC | PRN
  Administered 2022-06-13 – 2022-06-20 (×30): 0.5 mg via INTRAVENOUS
  Filled 2022-06-13 (×31): qty 0.5

## 2022-06-13 MED ORDER — ALUM & MAG HYDROXIDE-SIMETH 200-200-20 MG/5ML PO SUSP: 30.0000 mL | ORAL | Status: DC | PRN

## 2022-06-13 MED ORDER — SENNA 8.6 MG PO TABS
1.0000 | ORAL_TABLET | Freq: Every day | ORAL | Status: DC
  Administered 2022-06-14 – 2022-06-21 (×8): 8.6 mg via ORAL
  Filled 2022-06-13 (×8): qty 1

## 2022-06-13 MED ORDER — GUAIFENESIN 100 MG/5ML PO LIQD: 10.0000 mL | ORAL | Status: DC | PRN

## 2022-06-13 MED ORDER — METHOCARBAMOL 500 MG PO TABS
500.0000 mg | ORAL_TABLET | Freq: Four times a day (QID) | ORAL | Status: DC | PRN
  Administered 2022-06-14 – 2022-06-20 (×3): 500 mg via ORAL
  Filled 2022-06-13 (×3): qty 1

## 2022-06-13 MED ORDER — TRAMADOL HCL 50 MG PO TABS
100.0000 mg | ORAL_TABLET | Freq: Four times a day (QID) | ORAL | Status: DC
  Administered 2022-06-14 – 2022-06-15 (×8): 100 mg via ORAL
  Filled 2022-06-13 (×8): qty 2

## 2022-06-13 NOTE — Progress Notes (Signed)
Speech Language Pathology Treatment: Dysphagia  Patient Details Name: Patricia [name] MRN: [id number] DOB: 1970/08/01 Today's Date: 06/13/2022 Time: 4008-6761 SLP Time Calculation (min) (ACUTE ONLY): 9 min  Assessment / Plan / Recommendation Clinical Impression  Treatment today was brief as pt declined any PO trials, citing abdominal pain. Offered to notify RN, but pt reported that RN was already aware and pt knew when she would be due for more pain meds. Pt has ben decannulated since MBS on previous date. SLP did note straws in cups in pt's room so sign was placed at the [facility] with recommendations from MBS including no straws. Straws were also removed and the rest of the session focused on educating the patient. Will continue to f/u to try to see pt with POs.   HPI HPI: Patricia [name] is a 51 y.o. female who presented to hospital for excisional hernia repair (robotic hernia repair 04/23/22) with mesh, ileus post op on TNA. Developed vomiting and hypotension after NG came out on 04/29/22 and PNA on CXR, was tachycardic and BP improved with IV fluids. DX with sepsis and ABX started. Pt admitted to ICU on 10/2.  Was placed on levophed. Early AM on 10/3 was intubated for worsening metabolic/lactic acidosis. 10/4 cardiac cath with EF 15%; She had AKI as well. Started CRRT. Extubation 10/14 failed; underwent tracheostomy. TC trials started 10/16. Cleared by surgery to attempt POs 10/27. MBS 11/3 with severe dysphagia and silent aspiration of all consistencies. 11/14 trach down-sized to 4 cuffless. She has had her trach capped since 06/11/22 and is on RA.      SLP Plan  Continue with current plan of care      Recommendations for follow up therapy are one component of a multi-disciplinary discharge planning process, led by the attending physician.  Recommendations may be updated based on patient status, additional functional criteria and insurance authorization.    Recommendations  Diet  recommendations: Dysphagia 3 (mechanical soft);Thin liquid Liquids provided via: Cup;No straw Medication Administration: Whole meds with puree Supervision: Full supervision/cueing for compensatory strategies Compensations: Slow rate;Small sips/bites Postural Changes and/or Swallow Maneuvers: Seated upright 90 degrees                Oral Care Recommendations: Oral care BID Follow Up Recommendations: Skilled nursing-short term rehab (<3 hours/day) Assistance recommended at discharge: Intermittent Supervision/Assistance SLP Visit Diagnosis: Dysphagia, pharyngeal phase (R13.13) Plan: Continue with current plan of care           Patricia [name]., M.A. [place] Office [phone]  Secure chat preferred   06/13/2022, 2:52 PM

## 2022-06-13 NOTE — Progress Notes (Signed)
Patient refusing blood sugar checks.
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
## 2022-06-13 NOTE — Progress Notes (Signed)
Called [phone] for cortrak team member to come and remove patients cortrak this morning. Waiting for call back
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
## 2022-06-13 NOTE — Progress Notes (Signed)
Occupational Therapy Treatment Patient Details Name: Patricia [name] MRN: [id number] DOB: 02-Nov-1970 Today's Date: 06/13/2022   History of present illness 51 yo female admitted 04/23/22 for excisional hernia repair with mesh. Course complicated by post-op ileus, tachycardia, PNA, sepsis. Transfer to ICU 10/2 on levophed. Worsening acidosis 10/3 requiring intubation. Cardiac cath 10/4 with EF 15%. Pt with AKI; on CRRT 10/5-10/14. Failed extubation 10/8 and 10/14; s/p trach 10/14. PMH includes depression, OSA, asthma, schizophrenia, DM, insomnia.   OT comments  Patient received in supine and agreeable to OT treatment. Patient with abdomin pain and received pain meds and no complaints of nausea on this visit. Patient able to maintain sitting balance on EOB with supervision to perform bathing and gown change.  Patient performed lateral scooting towards HOB with mod assist to raise hips with pad use. Patient performed rolling in bed for pad change and positioning. Acute OT to continue to follow.    Recommendations for follow up therapy are one component of a multi-disciplinary discharge planning process, led by the attending physician.  Recommendations may be updated based on patient status, additional functional criteria and insurance authorization.    Follow Up Recommendations  Skilled nursing-short term rehab (<3 hours/day)     Assistance Recommended at Discharge Frequent or constant Supervision/Assistance  Patient can return home with the following  Two people to help with walking and/or transfers;A lot of help with bathing/dressing/bathroom;Assistance with cooking/housework;Help with stairs or ramp for entrance;Assist for transportation   Equipment Recommendations  Other (comment) (TBD next venue)    Recommendations for Other Services      Precautions / Restrictions Precautions Precautions: Fall Restrictions Weight Bearing Restrictions: No       Mobility Bed Mobility Overal bed  mobility: Needs Assistance Bed Mobility: Rolling, Sidelying to Sit, Sit to Supine Rolling: Mod assist Sidelying to sit: Mod assist, HOB elevated   Sit to supine: Mod assist, HOB elevated   General bed mobility comments: assistance with BLEs and assistance to scoot hips towards EOB with bed pad    Transfers Overall transfer level: Needs assistance                Lateral/Scoot Transfers: Mod assist General transfer comment: mod assist for lateral scooting towards HOB     Balance Overall balance assessment: Needs assistance Sitting-balance support: No upper extremity supported, Feet supported Sitting balance-Leahy Scale: Good Sitting balance - Comments: performed self care tasks seated on EOB with supervision for balance                                   ADL either performed or assessed with clinical judgement   ADL Overall ADL's : Needs assistance/impaired Eating/Feeding: Minimal assistance   Grooming: Wash/dry hands;Wash/dry face;Applying deodorant;Min guard;Sitting Grooming Details (indicate cue type and reason): EOB Upper Body Bathing: Sitting;Minimal assistance Upper Body Bathing Details (indicate cue type and reason): assistance with back Lower Body Bathing: Moderate assistance;Sitting/lateral leans Lower Body Bathing Details (indicate cue type and reason): able to bathe UB legs seated on EOB with assistance below knees Upper Body Dressing : Moderate assistance;Sitting Upper Body Dressing Details (indicate cue type and reason): changed gowns seated on EOB                   General ADL Comments: performed self care seated on EOB    Extremity/Trunk Assessment  Vision       Perception     Praxis      Cognition Arousal/Alertness: Awake/alert Behavior During Therapy: WFL for tasks assessed/performed Overall Cognitive Status: Within Functional Limits for tasks assessed                         Following  Commands: Follows one step commands with increased time       General Comments: alert and oriented        Exercises      Shoulder Instructions       General Comments      Pertinent Vitals/ Pain       Pain Assessment Pain Assessment: 0-10 Pain Score: 7  Faces Pain Scale: Hurts little more Pain Location: abdomen Pain Descriptors / Indicators: Grimacing, Guarding Pain Intervention(s): Limited activity within patient's tolerance, Monitored during session, RN gave pain meds during session  Home Living                                          Prior Functioning/Environment              Frequency  Min 2X/week        Progress Toward Goals  OT Goals(current goals can now be found in the care plan section)  Progress towards OT goals: Progressing toward goals  Acute Rehab OT Goals Patient Stated Goal: get better OT Goal Formulation: With patient Time For Goal Achievement: 06/24/22 Potential to Achieve Goals: Good ADL Goals Pt Will Perform Grooming: with set-up;with supervision;sitting Pt Will Perform Upper Body Dressing: with set-up;with supervision;sitting Pt Will Perform Lower Body Dressing: with mod assist;with adaptive equipment;sit to/from stand Pt Will Transfer to Toilet: with min assist;with +2 assist;squat pivot transfer;bedside commode Additional ADL Goal #1: Pt will be min guard A in and OOB for basic ADLs  Plan Discharge plan remains appropriate    Co-evaluation                 AM-PAC OT "6 Clicks" Daily Activity     Outcome Measure   Help from another person eating meals?: A Little Help from another person taking care of personal grooming?: A Little Help from another person toileting, which includes using toliet, bedpan, or urinal?: Total Help from another person bathing (including washing, rinsing, drying)?: A Lot Help from another person to put on and taking off regular upper body clothing?: A Little Help from another  person to put on and taking off regular lower body clothing?: A Lot 6 Click Score: 14    End of Session    OT Visit Diagnosis: Other abnormalities of gait and mobility (R26.89);Muscle weakness (generalized) (M62.81)   Activity Tolerance Patient tolerated treatment well   Patient Left in bed;with call bell/phone within reach;with bed alarm set   Nurse Communication Mobility status        Time: 3976-7341 OT Time Calculation (min): 34 min  Charges: OT General Charges $OT Visit: 1 Visit OT Treatments $Self Care/Home Management : 23-37 mins  Lodema Hong, Arjay  Office [place] 06/13/2022, 9:54 AM

## 2022-06-14 DIAGNOSIS — A419 Sepsis, unspecified organism: Secondary | ICD-10-CM | POA: Diagnosis not present

## 2022-06-14 DIAGNOSIS — R6521 Severe sepsis with septic shock: Secondary | ICD-10-CM | POA: Diagnosis not present

## 2022-06-14 LAB — GLUCOSE, CAPILLARY
Glucose-Capillary: 119 mg/dL — ABNORMAL HIGH (ref 70–99)
Glucose-Capillary: 110 mg/dL — ABNORMAL HIGH (ref 70–99)

## 2022-06-14 LAB — RENAL FUNCTION PANEL
Albumin: 2.7 g/dL — ABNORMAL LOW (ref 3.5–5.0)
Anion gap: 10 (ref 5–15)
BUN: 18 mg/dL (ref 6–20)
CO2: 18 mmol/L — ABNORMAL LOW (ref 22–32)
Calcium: 9.5 mg/dL (ref 8.9–10.3)
Chloride: 108 mmol/L (ref 98–111)
Creatinine, Ser: 1.13 mg/dL — ABNORMAL HIGH (ref 0.44–1.00)
GFR, Estimated: 59 mL/min — ABNORMAL LOW (ref >60)
Glucose, Bld: 111 mg/dL — ABNORMAL HIGH (ref 70–99)
Phosphorus: 4.7 mg/dL — ABNORMAL HIGH (ref 2.5–4.6)
Potassium: 3.9 mmol/L (ref 3.5–5.1)
Sodium: 136 mmol/L (ref 135–145)

## 2022-06-14 MED ORDER — METOPROLOL TARTRATE 50 MG PO TABS
75.0000 mg | ORAL_TABLET | Freq: Two times a day (BID) | ORAL | Status: DC
  Administered 2022-06-14 – 2022-06-21 (×14): 75 mg via ORAL
  Filled 2022-06-14 (×14): qty 1

## 2022-06-14 NOTE — Progress Notes (Signed)
44 Days Post-Op   Subjective/Chief Complaint: Threw up yesterday Eating fine today Having bowel movements  Objective: Vital signs in last 24 hours: Temp:  [97.7 F (36.5 C)-98.9 F (37.2 C)] 97.7 F (36.5 C) (11/17 0803) Pulse Rate:  [87-110] 95 (11/17 0803) Resp:  [17-20] 18 (11/17 0803) BP: (119-134)/(69-118) 123/84 (11/17 0803) SpO2:  [97 %-98 %] 98 % (11/17 0803) Last BM Date : 06/13/22  Intake/Output from previous day: 11/16 0701 - 11/17 0700 In: 240 [P.O.:240] Out: 1650 [Urine:1650] Intake/Output this shift: No intake/output data recorded.  Abd - abdomen soft, nontender  Lab Results:  Recent Labs    06/12/22 0407  WBC 14.1*  HGB 9.5*  HCT 30.8*  PLT 500*    BMET Recent Labs    06/13/22 0420 06/14/22 0703  NA 136 136  K 3.7 3.9  CL 106 108  CO2 19* 18*  GLUCOSE 100* 111*  BUN 24* 18  CREATININE 1.15* 1.13*  CALCIUM 9.5 9.5    PT/INR No results for input(s): "LABPROT", "INR" in the last 72 hours. ABG No results for input(s): "PHART", "HCO3" in the last 72 hours.  Invalid input(s): "PCO2", "PO2"  Studies/Results: No results found.  Anti-infectives: Anti-infectives (From admission, onward)    Start     Dose/Rate Route Frequency Ordered Stop   06/04/22 2100  ceFAZolin (ANCEF) IVPB 2g/100 mL premix        2 g 200 mL/hr over 30 Minutes Intravenous Every 8 hours 06/04/22 1103 06/05/22 2037   06/01/22 1515  ceFEPIme (MAXIPIME) 2 g in sodium chloride 0.9 % 100 mL IVPB  Status:  Discontinued        2 g 200 mL/hr over 30 Minutes Intravenous Every 12 hours 06/01/22 1420 06/04/22 1103   05/15/22 1445  ceFAZolin (ANCEF) IVPB 2g/100 mL premix       Note to Pharmacy: To be given in IR   2 g 200 mL/hr over 30 Minutes Intravenous  Once 05/15/22 1352 05/15/22 1616   05/15/22 1230  ceFAZolin (ANCEF) powder 1 g  Status:  Discontinued        1 g Other To Surgery 05/15/22 1140 05/15/22 1239   05/15/22 1230  ceFAZolin (ANCEF) IVPB 2g/100 mL premix        2  g 200 mL/hr over 30 Minutes Intravenous To Radiology 05/15/22 1140 05/15/22 1140   05/15/22 0000  ceFAZolin (ANCEF) IVPB 2g/100 mL premix        2 g 200 mL/hr over 30 Minutes Intravenous To Radiology 05/14/22 1308 05/15/22 0005   05/13/22 2134  meropenem (MERREM) 500 mg in sodium chloride 0.9 % 100 mL IVPB        500 mg 200 mL/hr over 30 Minutes Intravenous Every 24 hours 05/13/22 0815 05/17/22 0700   05/13/22 1000  meropenem (MERREM) 500 mg in sodium chloride 0.9 % 100 mL IVPB  Status:  Discontinued        500 mg 200 mL/hr over 30 Minutes Intravenous Every 12 hours 05/12/22 0940 05/12/22 0946   05/12/22 2200  meropenem (MERREM) 500 mg in sodium chloride 0.9 % 100 mL IVPB  Status:  Discontinued        500 mg 200 mL/hr over 30 Minutes Intravenous Every 12 hours 05/12/22 0946 05/13/22 0815   05/12/22 2000  meropenem (MERREM) 1 g in sodium chloride 0.9 % 100 mL IVPB  Status:  Discontinued        1 g 200 mL/hr over 30 Minutes Intravenous  Once 05/12/22  0940 05/12/22 0946   05/06/22 1530  meropenem (MERREM) 1 g in sodium chloride 0.9 % 100 mL IVPB  Status:  Discontinued        1 g 200 mL/hr over 30 Minutes Intravenous Every 8 hours 05/06/22 1438 05/12/22 0940   05/04/22 1000  vancomycin (VANCOCIN) IVPB 1000 mg/200 mL premix  Status:  Discontinued        1,000 mg 200 mL/hr over 60 Minutes Intravenous Every 24 hours 05/03/22 0802 05/08/22 0913   05/03/22 0845  vancomycin (VANCOREADY) IVPB 2000 mg/400 mL        2,000 mg 200 mL/hr over 120 Minutes Intravenous  Once 05/03/22 0751 05/03/22 1114   05/01/22 1800  piperacillin-tazobactam (ZOSYN) IVPB 3.375 g  Status:  Discontinued        3.375 g 12.5 mL/hr over 240 Minutes Intravenous Every 6 hours 05/01/22 1426 05/01/22 1427   05/01/22 1515  piperacillin-tazobactam (ZOSYN) IVPB 3.375 g  Status:  Discontinued        3.375 g 100 mL/hr over 30 Minutes Intravenous Every 6 hours 05/01/22 1428 05/06/22 1438   04/30/22 1400  piperacillin-tazobactam  (ZOSYN) IVPB 2.25 g  Status:  Discontinued        2.25 g 100 mL/hr over 30 Minutes Intravenous Every 8 hours 04/30/22 1325 05/01/22 1426   04/29/22 1530  piperacillin-tazobactam (ZOSYN) IVPB 3.375 g  Status:  Discontinued        3.375 g 12.5 mL/hr over 240 Minutes Intravenous Every 8 hours 04/29/22 1433 04/30/22 1325   04/29/22 1145  metroNIDAZOLE (FLAGYL) IVPB 500 mg  Status:  Discontinued        500 mg 100 mL/hr over 60 Minutes Intravenous Every 12 hours 04/29/22 1047 04/29/22 1433   04/29/22 1145  vancomycin (VANCOCIN) IVPB 1000 mg/200 mL premix  Status:  Discontinued        1,000 mg 200 mL/hr over 60 Minutes Intravenous  Once 04/29/22 1047 04/29/22 1055   04/29/22 1145  vancomycin (VANCOREADY) IVPB 1500 mg/300 mL  Status:  Discontinued        1,500 mg 150 mL/hr over 120 Minutes Intravenous Every 24 hours 04/29/22 1055 04/29/22 1357   04/29/22 1045  ceFEPIme (MAXIPIME) 2 g in sodium chloride 0.9 % 100 mL IVPB  Status:  Discontinued        2 g 200 mL/hr over 30 Minutes Intravenous Every 8 hours 04/29/22 0954 04/29/22 1433   04/23/22 0730  ceFAZolin (ANCEF) IVPB 2g/100 mL premix        2 g 200 mL/hr over 30 Minutes Intravenous On call to O.R. 04/23/22 1601 04/23/22 0828       Assessment/Plan: s/p Procedure(s): Coronary/Graft Acute MI Revascularization (N/A) LEFT HEART CATH AND CORONARY ANGIOGRAPHY (N/A) RIGHT/LEFT HEART CATH AND CORONARY ANGIOGRAPHY (N/A) Advance diet Patricia [name] is s/p robotic incisional hernia repair on 04/23/22 Needs SNF or to make progress with therapy prior to d/c home with home health Diet as tolerated, I'm not sure why she has vomiting episodes but ultrasound and CT workup negative for anatomical issues    Acute metabolic encephalopathy superimposed on hx bipolar disorder, schizophrenia   ICU delirium- improved now off all IV infusions. Acute biventricular HFrEF (Etiology sepsis, stress induced CM. Has clean coronaries) Cardiogenic shock resolved, EF  improved.  Acute hypoxic respiratory failure Aspiration PNA Pulm edema   RLL VAP  AKI with oliguria; no baseline CKD - Seems to have tolerated contrast for CTPE Anemia, acute on chronic  DM2 with hyperglycemia  Severe  protein calorie malnutrition  Pressure injury lip - improving  Deconditioning    Appreciate [facility] care with multiple issues   Needs therapy and recouperation     Dispo - Very close to discharge once safe plan in place  LOS: 51 days    Patricia [name] 06/14/2022

## 2022-06-14 NOTE — Progress Notes (Signed)
Refused FSBS. States she is not diabetic and has never been on insulin.

## 2022-06-14 NOTE — Progress Notes (Signed)
SW continuing to follow for SNF placement. Pt has been decannulated and NGT removed, on DYS 3 diet. SNF search started with updated information. Pt will need level 2 PASRR started closer to dc. SW will provide updates as available.   Wandra Feinstein, MSW, LCSW [phone] (coverage)

## 2022-06-14 NOTE — Progress Notes (Signed)
Speech Language Pathology Treatment: Dysphagia  Patient Details Name: Patricia [name] MRN: [id number] DOB: 1971-01-24 Today's Date: 06/14/2022 Time: 8315-1761 SLP Time Calculation (min) (ACUTE ONLY): 8 min  Assessment / Plan / Recommendation Clinical Impression  Pt was seen for f/u, denying abdominal pain this morning but also with c/o low appetite, fear of nausea. She was agreeable to sips of thin liquids. Straws were found on both meal trays in her room (dinner from last night as well as breakfast this morning, all of which were removed by SLP), but not on the cup at her bedside table. Pt verbalized with Mod I that recommendations are to not use straws. Despite initial prompting to take small, single sips, pt still was a little impulsive with liquids. SLP provided additional cueing as well as modeling for pt to take one sip at a time, after which she demonstrated good carryover. Suspect that she will need additional reinforcement. SLP will continue to follow.    HPI HPI: Patricia [name] is a 51 y.o. female who presented to hospital for excisional hernia repair (robotic hernia repair 04/23/22) with mesh, ileus post op on TNA. Developed vomiting and hypotension after NG came out on 04/29/22 and PNA on CXR, was tachycardic and BP improved with IV fluids. DX with sepsis and ABX started. Pt admitted to ICU on 10/2.  Was placed on levophed. Early AM on 10/3 was intubated for worsening metabolic/lactic acidosis. 10/4 cardiac cath with EF 15%; She had AKI as well. Started CRRT. Extubation 10/14 failed; underwent tracheostomy. TC trials started 10/16. Cleared by surgery to attempt POs 10/27. MBS 11/3 with severe dysphagia and silent aspiration of all consistencies. 11/14 trach down-sized to 4 cuffless. She has had her trach capped since 06/11/22 and is on RA.      SLP Plan  Continue with current plan of care      Recommendations for follow up therapy are one component of a multi-disciplinary discharge  planning process, led by the attending physician.  Recommendations may be updated based on patient status, additional functional criteria and insurance authorization.    Recommendations  Diet recommendations: Dysphagia 3 (mechanical soft);Thin liquid Liquids provided via: Cup;No straw Medication Administration: Whole meds with puree Supervision: Full supervision/cueing for compensatory strategies Compensations: Slow rate;Small sips/bites Postural Changes and/or Swallow Maneuvers: Seated upright 90 degrees                Oral Care Recommendations: Oral care BID Follow Up Recommendations: Skilled nursing-short term rehab (<3 hours/day) Assistance recommended at discharge: Intermittent Supervision/Assistance SLP Visit Diagnosis: Dysphagia, pharyngeal phase (R13.13) Plan: Continue with current plan of care           Patricia [name]., M.A. [place] Office [phone]  Secure chat preferred   06/14/2022, 9:28 AM
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
## 2022-06-14 NOTE — NC FL2 (Signed)
[place] LEVEL OF CARE SCREENING TOOL     IDENTIFICATION  Patient Name: Patricia [name] Birthdate: 1971/05/16 Sex: female Admission Date (Current Location): 04/23/2022  [facility] and [place] Number:  Herbalist and Address:  [facility] 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   Dysphagia 06/03/2022   Pressure injury of skin 78/24/2353   Acute systolic heart failure ([place])    Acute respiratory failure with hypoxia ([place])    Septic shock ([place]) 04/29/2022   Aspiration pneumonia ([place]) 04/29/2022   Prolonged QT interval 04/29/2022   Essential hypertension 04/29/2022   Hernia of abdominal cavity 04/24/2022   Incisional hernia 04/23/2022   Chronic low back pain 11/20/2021   Limited mobility 11/20/2021   Ambulates with cane 11/20/2021   Ventral hernia without obstruction or gangrene 11/15/2021   Right carpal tunnel syndrome 11/15/2021   Diabetes mellitus without complication ([place]) 61/44/3154   Insomnia 11/15/2021   Schizophrenia ([place]) 05/21/2017   Morbid obesity ([place]) 07/22/2014   Obstructive sleep apnea 04/05/2008   MIGRAINE HEADACHE 04/05/2008   Diabetes mellitus type 2 in obese ([employer]) 03/21/2008   BIPOLAR DISORDER UNSPECIFIED 03/21/2008   DEPRESSION 03/21/2008   Asthma 03/21/2008    Orientation RESPIRATION BLADDER Height & Weight     Self, Time, Situation, Place  Normal Incontinent Weight: (!) 380 lb 8.2 oz (172.6 kg) Height:  '5\' 4"'$  (162.6 cm)   BEHAVIORAL SYMPTOMS/MOOD NEUROLOGICAL BOWEL NUTRITION STATUS      Incontinent Diet (Dysphagia 3 (mechanical soft);Thin liquid  Liquids provided via: Cup;No straw)  AMBULATORY STATUS COMMUNICATION OF NEEDS Skin   Extensive Assist Verbally Surgical wounds                       Personal Care Assistance Level of Assistance  Bathing, Dressing, Feeding Bathing Assistance: Maximum assistance Feeding assistance: Limited assistance Dressing Assistance: Maximum assistance     Functional Limitations Info  Sight, Hearing, Speech Sight Info: Adequate Hearing Info: Adequate Speech Info: Adequate    SPECIAL CARE FACTORS FREQUENCY  PT (By licensed PT), OT (By licensed OT), Speech therapy     PT Frequency: 5x a week OT Frequency: 5x a week            Contractures Contractures Info: Not present    Additional Factors Info  Code Status, Allergies Code Status Info: FULL CODE Allergies Info: Iodinated Contrast Media, Peanut-containing Drug Products, Oxycodone-acetaminophen           Current Medications (06/14/2022):  This is the current hospital active medication list Current Facility-Administered Medications  Medication Dose Route Frequency Provider Last Rate Last Admin   acetaminophen (TYLENOL) tablet 650 mg  650 mg Oral Q6H PRN Stechschulte, Nickola Major, MD       alum & mag hydroxide-simeth (MAALOX/MYLANTA) 200-200-20 MG/5ML suspension 30 mL  30 mL Oral Q4H PRN Stechschulte, Nickola Major, MD       bisacodyl (DULCOLAX) suppository 10 mg  10 mg Rectal Daily PRN Elgergawy, Silver Huguenin,  MD       Chlorhexidine Gluconate Cloth 2 % PADS 6 each  6 each Topical Daily Stechschulte, Nickola Major, MD   6 each at 06/14/22 0816   diphenhydrAMINE (BENADRYL) capsule 25 mg  25 mg Oral Q4H PRN Stechschulte, Nickola Major, MD   25 mg at 06/14/22 0815   feeding supplement (ENSURE ENLIVE / ENSURE PLUS) liquid 237 mL  237 mL Oral BID BM Stechschulte, Nickola Major, MD   237 mL at 06/13/22 1413   fiber (NUTRISOURCE FIBER) 1 packet  1  packet Per Tube TID Domenic Polite, MD   1 packet at 06/13/22 2112   Gerhardt's butt cream   Topical PRN Kipp Brood, MD   Given at 06/12/22 0933   guaiFENesin (ROBITUSSIN) 100 MG/5ML liquid 10 mL  10 mL Oral Q4H PRN Stechschulte, Nickola Major, MD       heparin injection 5,000 Units  5,000 Units Subcutaneous Q8H [name] P, DO   5,000 Units at 06/14/22 1213   hydrALAZINE (APRESOLINE) injection 5 mg  5 mg Intravenous Q4H PRN Elgergawy, Silver Huguenin, MD       HYDROmorphone (DILAUDID) injection 0.5 mg  0.5 mg Intravenous Q4H PRN Stechschulte, Nickola Major, MD   0.5 mg at 06/14/22 0823   insulin aspart (novoLOG) injection 0-20 Units  0-20 Units Subcutaneous Q6H Amin, Ankit Chirag, MD   3 Units at 06/11/22 1354   insulin aspart (novoLOG) injection 4 Units  4 Units Subcutaneous Q6H Wilson Singer I, RPH   4 Units at 06/13/22 0035   lip balm (CARMEX) ointment   Topical PRN Consuelo Pandy, PA-C   Given at 06/07/22 1234   methocarbamol (ROBAXIN) tablet 500 mg  500 mg Oral Q6H PRN Stechschulte, Nickola Major, MD   500 mg at 06/14/22 0815   metoprolol tartrate (LOPRESSOR) tablet 75 mg  75 mg Oral BID Charlynne Cousins, MD       multivitamin with minerals tablet 1 tablet  1 tablet Oral Daily Stechschulte, Nickola Major, MD   1 tablet at 06/14/22 0815   nystatin (MYCOSTATIN/NYSTOP) topical powder   Topical TID Elgergawy, Silver Huguenin, MD   Given at 06/14/22 5102   Oral care mouth rinse  15 mL Mouth Rinse 4 times per day Stechschulte, Nickola Major, MD   15 mL at 06/14/22 1211   Oral care mouth rinse  15 mL Mouth Rinse PRN Stechschulte, Nickola Major, MD       pantoprazole (PROTONIX) EC tablet 40 mg  40 mg Oral Daily Stechschulte, Nickola Major, MD   40 mg at 06/14/22 0816   polyethylene glycol (MIRALAX / GLYCOLAX) packet 17 g  17 g Oral Daily Stechschulte, Nickola Major, MD   17 g at 06/13/22 5852   prochlorperazine (COMPAZINE) injection 10 mg  10 mg Intravenous Q4H PRN Stechschulte, Nickola Major, MD   10 mg at 06/14/22 0121   QUEtiapine (SEROQUEL) tablet  25 mg  25 mg Oral QHS Stechschulte, Nickola Major, MD   25 mg at 06/13/22 2110   senna (SENOKOT) tablet 8.6 mg  1 tablet Oral Daily Stechschulte, Nickola Major, MD   8.6 mg at 06/14/22 0815   sodium chloride flush (NS) 0.9 % injection 10-40 mL  10-40 mL Intracatheter Q12H Stechschulte, Nickola Major, MD   10 mL at 06/14/22 0817   traMADol (ULTRAM) tablet 100 mg  100 mg Oral Q6H Stechschulte, Nickola Major, MD   100 mg at 06/14/22 1210     Discharge Medications: Please see discharge summary  for a list of discharge medications.  Relevant Imaging Results:  Relevant Lab Results:   Additional Information SS# [id number]  Wandra Feinstein [place], [place]

## 2022-06-15 LAB — GLUCOSE, CAPILLARY
Glucose-Capillary: 102 mg/dL — ABNORMAL HIGH (ref 70–99)
Glucose-Capillary: 109 mg/dL — ABNORMAL HIGH (ref 70–99)
Glucose-Capillary: 118 mg/dL — ABNORMAL HIGH (ref 70–99)
Glucose-Capillary: 92 mg/dL (ref 70–99)
Glucose-Capillary: 99 mg/dL (ref 70–99)

## 2022-06-15 MED ORDER — TRAMADOL HCL 50 MG PO TABS
100.0000 mg | ORAL_TABLET | Freq: Four times a day (QID) | ORAL | Status: DC
  Administered 2022-06-16 – 2022-06-21 (×18): 100 mg via ORAL
  Filled 2022-06-15 (×19): qty 2

## 2022-06-15 NOTE — Progress Notes (Signed)
Speech Language Pathology Treatment: Dysphagia  Patient Details Name: Patricia [name] MRN: [id number] DOB: [birth year] Today's Date: 06/15/2022 Time: 2778-2423 SLP Time Calculation (min) (ACUTE ONLY): 12 min  Assessment / Plan / Recommendation Clinical Impression  Pt has made excellent gains.  She demonstrated improved recall to avoid straws.  She drank thin liquids and was willing to have only a few bites of a graham cracker, reporting ongoing lack of appetite. No overt s/s of aspiration. No impulsive behavior associated with drinking today. Provided encouragement.  Swallowing function and basic recall of precautions continues to improve - SLP will follow for diet advancement/safety.    HPI HPI: Patricia [name] is a 51 y.o. female who presented to hospital for excisional hernia repair (robotic hernia repair 04/23/22) with mesh, ileus post op on TNA. Developed vomiting and hypotension after NG came out on 04/29/22 and PNA on CXR, was tachycardic and BP improved with IV fluids. DX with sepsis and ABX started. Pt admitted to ICU on 10/2.  Was placed on levophed. Early AM on 10/3 was intubated for worsening metabolic/lactic acidosis. 10/4 cardiac cath with EF 15%; She had AKI as well. Started CRRT. Extubation 10/14 failed; underwent tracheostomy. TC trials started 10/16. Cleared by surgery to attempt POs 10/27. MBS 11/3 with severe dysphagia and silent aspiration of all consistencies. 11/14 trach down-sized to 4 cuffless. MBS 11/15- significant improvements, started dysphagia 3/thin liquids. Dacannulated same date.      SLP Plan  Continue with current plan of care      Recommendations for follow up therapy are one component of a multi-disciplinary discharge planning process, led by the attending physician.  Recommendations may be updated based on patient status, additional functional criteria and insurance authorization.    Recommendations  Diet recommendations: Dysphagia 3 (mechanical soft);Thin  liquid Liquids provided via: Cup;No straw Medication Administration: Whole meds with puree Supervision: Full supervision/cueing for compensatory strategies Compensations: Slow rate;Small sips/bites Postural Changes and/or Swallow Maneuvers: Seated upright 90 degrees                Oral Care Recommendations: Oral care BID Follow Up Recommendations: Skilled nursing-short term rehab (<3 hours/day) Assistance recommended at discharge: Intermittent Supervision/Assistance SLP Visit Diagnosis: Dysphagia, pharyngeal phase (R13.13) Plan: Continue with current plan of care        Patricia [name] L. Patricia Ringer, MA CCC/SLP Clinical Specialist - Acute Care SLP Acute Rehabilitation Services Office number [phone]    Patricia [name]  06/15/2022, 10:40 AM

## 2022-06-15 NOTE — Progress Notes (Signed)
45 Days Post-Op   Subjective/Chief Complaint: No acute changes. Denies any recent nausea or vomiting.  Objective: Vital signs in last 24 hours: Temp:  [97.7 F (36.5 C)-98.3 F (36.8 C)] 97.7 F (36.5 C) (11/18 0754) Pulse Rate:  [83-99] 94 (11/18 0754) Resp:  [16-20] 19 (11/18 0754) BP: (109-135)/(84-100) 135/100 (11/18 0754) SpO2:  [97 %-100 %] 99 % (11/18 0754) Weight:  [162.6 kg] 162.6 kg (11/18 0434) Last BM Date : 06/14/22  Intake/Output from previous day: 11/17 0701 - 11/18 0700 In: -  Out: 500 [Urine:500] Intake/Output this shift: No intake/output data recorded.  Alert, NAD, resting comfortably Normal work of breathing Abd - abdomen soft, nontender, nondistended. Incisions well-healed.  Lab Results:  No results for input(s): "WBC", "HGB", "HCT", "PLT" in the last 72 hours. BMET Recent Labs    06/13/22 0420 06/14/22 0703  NA 136 136  K 3.7 3.9  CL 106 108  CO2 19* 18*  GLUCOSE 100* 111*  BUN 24* 18  CREATININE 1.15* 1.13*  CALCIUM 9.5 9.5   PT/INR No results for input(s): "LABPROT", "INR" in the last 72 hours. ABG No results for input(s): "PHART", "HCO3" in the last 72 hours.  Invalid input(s): "PCO2", "PO2"  Studies/Results: No results found.  Anti-infectives: Anti-infectives (From admission, onward)    Start     Dose/Rate Route Frequency Ordered Stop   06/04/22 2100  ceFAZolin (ANCEF) IVPB 2g/100 mL premix        2 g 200 mL/hr over 30 Minutes Intravenous Every 8 hours 06/04/22 1103 06/05/22 2037   06/01/22 1515  ceFEPIme (MAXIPIME) 2 g in sodium chloride 0.9 % 100 mL IVPB  Status:  Discontinued        2 g 200 mL/hr over 30 Minutes Intravenous Every 12 hours 06/01/22 1420 06/04/22 1103   05/15/22 1445  ceFAZolin (ANCEF) IVPB 2g/100 mL premix       Note to Pharmacy: To be given in IR   2 g 200 mL/hr over 30 Minutes Intravenous  Once 05/15/22 1352 05/15/22 1616   05/15/22 1230  ceFAZolin (ANCEF) powder 1 g  Status:  Discontinued        1 g  Other To Surgery 05/15/22 1140 05/15/22 1239   05/15/22 1230  ceFAZolin (ANCEF) IVPB 2g/100 mL premix        2 g 200 mL/hr over 30 Minutes Intravenous To Radiology 05/15/22 1140 05/15/22 1140   05/15/22 0000  ceFAZolin (ANCEF) IVPB 2g/100 mL premix        2 g 200 mL/hr over 30 Minutes Intravenous To Radiology 05/14/22 1308 05/15/22 0005   05/13/22 2134  meropenem (MERREM) 500 mg in sodium chloride 0.9 % 100 mL IVPB        500 mg 200 mL/hr over 30 Minutes Intravenous Every 24 hours 05/13/22 0815 05/17/22 0700   05/13/22 1000  meropenem (MERREM) 500 mg in sodium chloride 0.9 % 100 mL IVPB  Status:  Discontinued        500 mg 200 mL/hr over 30 Minutes Intravenous Every 12 hours 05/12/22 0940 05/12/22 0946   05/12/22 2200  meropenem (MERREM) 500 mg in sodium chloride 0.9 % 100 mL IVPB  Status:  Discontinued        500 mg 200 mL/hr over 30 Minutes Intravenous Every 12 hours 05/12/22 0946 05/13/22 0815   05/12/22 2000  meropenem (MERREM) 1 g in sodium chloride 0.9 % 100 mL IVPB  Status:  Discontinued        1 g  200 mL/hr over 30 Minutes Intravenous  Once 05/12/22 0940 05/12/22 0946   05/06/22 1530  meropenem (MERREM) 1 g in sodium chloride 0.9 % 100 mL IVPB  Status:  Discontinued        1 g 200 mL/hr over 30 Minutes Intravenous Every 8 hours 05/06/22 1438 05/12/22 0940   05/04/22 1000  vancomycin (VANCOCIN) IVPB 1000 mg/200 mL premix  Status:  Discontinued        1,000 mg 200 mL/hr over 60 Minutes Intravenous Every 24 hours 05/03/22 0802 05/08/22 0913   05/03/22 0845  vancomycin (VANCOREADY) IVPB 2000 mg/400 mL        2,000 mg 200 mL/hr over 120 Minutes Intravenous  Once 05/03/22 0751 05/03/22 1114   05/01/22 1800  piperacillin-tazobactam (ZOSYN) IVPB 3.375 g  Status:  Discontinued        3.375 g 12.5 mL/hr over 240 Minutes Intravenous Every 6 hours 05/01/22 1426 05/01/22 1427   05/01/22 1515  piperacillin-tazobactam (ZOSYN) IVPB 3.375 g  Status:  Discontinued        3.375 g 100 mL/hr over  30 Minutes Intravenous Every 6 hours 05/01/22 1428 05/06/22 1438   04/30/22 1400  piperacillin-tazobactam (ZOSYN) IVPB 2.25 g  Status:  Discontinued        2.25 g 100 mL/hr over 30 Minutes Intravenous Every 8 hours 04/30/22 1325 05/01/22 1426   04/29/22 1530  piperacillin-tazobactam (ZOSYN) IVPB 3.375 g  Status:  Discontinued        3.375 g 12.5 mL/hr over 240 Minutes Intravenous Every 8 hours 04/29/22 1433 04/30/22 1325   04/29/22 1145  metroNIDAZOLE (FLAGYL) IVPB 500 mg  Status:  Discontinued        500 mg 100 mL/hr over 60 Minutes Intravenous Every 12 hours 04/29/22 1047 04/29/22 1433   04/29/22 1145  vancomycin (VANCOCIN) IVPB 1000 mg/200 mL premix  Status:  Discontinued        1,000 mg 200 mL/hr over 60 Minutes Intravenous  Once 04/29/22 1047 04/29/22 1055   04/29/22 1145  vancomycin (VANCOREADY) IVPB 1500 mg/300 mL  Status:  Discontinued        1,500 mg 150 mL/hr over 120 Minutes Intravenous Every 24 hours 04/29/22 1055 04/29/22 1357   04/29/22 1045  ceFEPIme (MAXIPIME) 2 g in sodium chloride 0.9 % 100 mL IVPB  Status:  Discontinued        2 g 200 mL/hr over 30 Minutes Intravenous Every 8 hours 04/29/22 0954 04/29/22 1433   04/23/22 0730  ceFAZolin (ANCEF) IVPB 2g/100 mL premix        2 g 200 mL/hr over 30 Minutes Intravenous On call to O.R. 04/23/22 1610 04/23/22 0828       Assessment/Plan: s/p Procedure(s): Coronary/Graft Acute MI Revascularization (N/A) LEFT HEART CATH AND CORONARY ANGIOGRAPHY (N/A) RIGHT/LEFT HEART CATH AND CORONARY ANGIOGRAPHY (N/A) Advance diet [name] is s/p robotic incisional hernia repair on 04/23/22 Will need either SNF placement, or progress to discharge home with home health. Continue dysphagia diet as tolerated.    Acute metabolic encephalopathy superimposed on hx bipolar disorder, schizophrenia   Acute biventricular HFrEF (Etiology sepsis, stress induced CM. Has clean coronaries) Aspiration PNA Pulm edema   RLL VAP  Anemia, acute on  chronic  DM2 with hyperglycemia  Severe protein calorie malnutrition  Deconditioning    Appreciate [facility] care with multiple issues   Needs therapy and recouperation     Dispo - Very close to discharge once safe plan in place  LOS: 52 days  Patricia [name] 06/15/2022

## 2022-06-16 DIAGNOSIS — R6521 Severe sepsis with septic shock: Secondary | ICD-10-CM | POA: Diagnosis not present

## 2022-06-16 DIAGNOSIS — A419 Sepsis, unspecified organism: Secondary | ICD-10-CM | POA: Diagnosis not present

## 2022-06-16 LAB — GLUCOSE, CAPILLARY
Glucose-Capillary: 80 mg/dL (ref 70–99)
Glucose-Capillary: 81 mg/dL (ref 70–99)
Glucose-Capillary: 91 mg/dL (ref 70–99)
Glucose-Capillary: 94 mg/dL (ref 70–99)
Glucose-Capillary: 98 mg/dL (ref 70–99)

## 2022-06-16 NOTE — Progress Notes (Signed)
46 Days Post-Op   Subjective/Chief Complaint: No changes. Did well with SLP yesterday. Denies nausea or vomiting.  Objective: Vital signs in last 24 hours: Temp:  [97.8 F (36.6 C)-98.2 F (36.8 C)] 97.8 F (36.6 C) (11/19 0751) Pulse Rate:  [79-93] 87 (11/19 0751) Resp:  [16-19] 19 (11/19 0751) BP: (101-123)/(83-88) 101/88 (11/19 0751) SpO2:  [97 %-100 %] 100 % (11/19 0751) Last BM Date : 06/14/22  Intake/Output from previous day: 11/18 0701 - 11/19 0700 In: 10 [I.V.:10] Out: 400 [Urine:400] Intake/Output this shift: No intake/output data recorded.  Alert, NAD, resting comfortably Normal work of breathing Clean dressing in place over trach site. Abd - abdomen soft, nontender, nondistended. Incisions well-healed.  Lab Results:  No results for input(s): "WBC", "HGB", "HCT", "PLT" in the last 72 hours. BMET Recent Labs    06/14/22 0703  NA 136  K 3.9  CL 108  CO2 18*  GLUCOSE 111*  BUN 18  CREATININE 1.13*  CALCIUM 9.5   PT/INR No results for input(s): "LABPROT", "INR" in the last 72 hours. ABG No results for input(s): "PHART", "HCO3" in the last 72 hours.  Invalid input(s): "PCO2", "PO2"  Studies/Results: No results found.  Anti-infectives: Anti-infectives (From admission, onward)    Start     Dose/Rate Route Frequency Ordered Stop   06/04/22 2100  ceFAZolin (ANCEF) IVPB 2g/100 mL premix        2 g 200 mL/hr over 30 Minutes Intravenous Every 8 hours 06/04/22 1103 06/05/22 2037   06/01/22 1515  ceFEPIme (MAXIPIME) 2 g in sodium chloride 0.9 % 100 mL IVPB  Status:  Discontinued        2 g 200 mL/hr over 30 Minutes Intravenous Every 12 hours 06/01/22 1420 06/04/22 1103   05/15/22 1445  ceFAZolin (ANCEF) IVPB 2g/100 mL premix       Note to Pharmacy: To be given in IR   2 g 200 mL/hr over 30 Minutes Intravenous  Once 05/15/22 1352 05/15/22 1616   05/15/22 1230  ceFAZolin (ANCEF) powder 1 g  Status:  Discontinued        1 g Other To Surgery 05/15/22 1140  05/15/22 1239   05/15/22 1230  ceFAZolin (ANCEF) IVPB 2g/100 mL premix        2 g 200 mL/hr over 30 Minutes Intravenous To Radiology 05/15/22 1140 05/15/22 1140   05/15/22 0000  ceFAZolin (ANCEF) IVPB 2g/100 mL premix        2 g 200 mL/hr over 30 Minutes Intravenous To Radiology 05/14/22 1308 05/15/22 0005   05/13/22 2134  meropenem (MERREM) 500 mg in sodium chloride 0.9 % 100 mL IVPB        500 mg 200 mL/hr over 30 Minutes Intravenous Every 24 hours 05/13/22 0815 05/17/22 0700   05/13/22 1000  meropenem (MERREM) 500 mg in sodium chloride 0.9 % 100 mL IVPB  Status:  Discontinued        500 mg 200 mL/hr over 30 Minutes Intravenous Every 12 hours 05/12/22 0940 05/12/22 0946   05/12/22 2200  meropenem (MERREM) 500 mg in sodium chloride 0.9 % 100 mL IVPB  Status:  Discontinued        500 mg 200 mL/hr over 30 Minutes Intravenous Every 12 hours 05/12/22 0946 05/13/22 0815   05/12/22 2000  meropenem (MERREM) 1 g in sodium chloride 0.9 % 100 mL IVPB  Status:  Discontinued        1 g 200 mL/hr over 30 Minutes Intravenous  Once 05/12/22  0940 05/12/22 0946   05/06/22 1530  meropenem (MERREM) 1 g in sodium chloride 0.9 % 100 mL IVPB  Status:  Discontinued        1 g 200 mL/hr over 30 Minutes Intravenous Every 8 hours 05/06/22 1438 05/12/22 0940   05/04/22 1000  vancomycin (VANCOCIN) IVPB 1000 mg/200 mL premix  Status:  Discontinued        1,000 mg 200 mL/hr over 60 Minutes Intravenous Every 24 hours 05/03/22 0802 05/08/22 0913   05/03/22 0845  vancomycin (VANCOREADY) IVPB 2000 mg/400 mL        2,000 mg 200 mL/hr over 120 Minutes Intravenous  Once 05/03/22 0751 05/03/22 1114   05/01/22 1800  piperacillin-tazobactam (ZOSYN) IVPB 3.375 g  Status:  Discontinued        3.375 g 12.5 mL/hr over 240 Minutes Intravenous Every 6 hours 05/01/22 1426 05/01/22 1427   05/01/22 1515  piperacillin-tazobactam (ZOSYN) IVPB 3.375 g  Status:  Discontinued        3.375 g 100 mL/hr over 30 Minutes Intravenous Every 6  hours 05/01/22 1428 05/06/22 1438   04/30/22 1400  piperacillin-tazobactam (ZOSYN) IVPB 2.25 g  Status:  Discontinued        2.25 g 100 mL/hr over 30 Minutes Intravenous Every 8 hours 04/30/22 1325 05/01/22 1426   04/29/22 1530  piperacillin-tazobactam (ZOSYN) IVPB 3.375 g  Status:  Discontinued        3.375 g 12.5 mL/hr over 240 Minutes Intravenous Every 8 hours 04/29/22 1433 04/30/22 1325   04/29/22 1145  metroNIDAZOLE (FLAGYL) IVPB 500 mg  Status:  Discontinued        500 mg 100 mL/hr over 60 Minutes Intravenous Every 12 hours 04/29/22 1047 04/29/22 1433   04/29/22 1145  vancomycin (VANCOCIN) IVPB 1000 mg/200 mL premix  Status:  Discontinued        1,000 mg 200 mL/hr over 60 Minutes Intravenous  Once 04/29/22 1047 04/29/22 1055   04/29/22 1145  vancomycin (VANCOREADY) IVPB 1500 mg/300 mL  Status:  Discontinued        1,500 mg 150 mL/hr over 120 Minutes Intravenous Every 24 hours 04/29/22 1055 04/29/22 1357   04/29/22 1045  ceFEPIme (MAXIPIME) 2 g in sodium chloride 0.9 % 100 mL IVPB  Status:  Discontinued        2 g 200 mL/hr over 30 Minutes Intravenous Every 8 hours 04/29/22 0954 04/29/22 1433   04/23/22 0730  ceFAZolin (ANCEF) IVPB 2g/100 mL premix        2 g 200 mL/hr over 30 Minutes Intravenous On call to O.R. 04/23/22 2951 04/23/22 0828       Assessment/Plan: s/p Procedure(s): Coronary/Graft Acute MI Revascularization (N/A) LEFT HEART CATH AND CORONARY ANGIOGRAPHY (N/A) RIGHT/LEFT HEART CATH AND CORONARY ANGIOGRAPHY (N/A) Advance diet Ms. Patricia [name] is s/p robotic incisional hernia repair on 04/23/22 Will need either SNF placement, or progress to discharge home with home health. Continue D3 diet, working with SLP.    Acute metabolic encephalopathy superimposed on hx bipolar disorder, schizophrenia   Acute biventricular HFrEF (Etiology sepsis, stress induced CM. Has clean coronaries) Aspiration PNA Pulm edema   RLL VAP  Anemia, acute on chronic  DM2 with hyperglycemia   Severe protein calorie malnutrition  Deconditioning    Appreciate [facility] care with multiple issues   Needs therapy and recouperation     Dispo - Very close to discharge once safe plan in place  LOS: 53 days    Patricia [name] 06/16/2022

## 2022-06-16 NOTE — Progress Notes (Signed)
[facility] CONSULT PROGRESS NOTE    Progress Note  [name]  DUK:[id number] DOB: 24-Nov-1970 DOA: 04/23/2022 PCP: [name], MD     Brief Narrative:   Patricia [name] is an 51 y.o. female past medical history of diabetes mellitus type 2 depression migraine schizophrenia, essential hypertension who underwent robotic hernia repair On 04/23/2022 eventually developed ileus had to be placed on TPN subsequently developed hypotension and vomiting NG tube came out there was a concern for pneumonia and started empirically on antibiotics, she developed septic shock and was on pressors eventually intubated on 04/30/2022.  She developed complications with metabolic acidosis acute kidney injury, 2D echo that showed an EF of 30% and elevated troponins.  Left heart cath was performed that showed nonobstructive coronary artery disease seen by the heart failure team and started on dopamine CRRT successfully extubated then again reintubated due to neuromuscular weakness on 05/05/2022.  Eventually extubated on 05/11/2022 to BiPAP, tracheostomy was placed due to muscle weakness, tube feedings were held due to nausea and vomiting.  Hospital course was complicated by hyponatremia acute kidney injury and a brief course of CRRT.  Renal function since then has recovered.  She currently has dysphagia and has a core track.  Over the weekend of November 11 and 12 she developed chest pain CTA was negative for PE chest pain resolved.  Lurline Idol was exchanged and downsized by PCCM on 06/11/2022.  That day she had a transient run of V. Tach.  We were consulted for management of hypertension and an elevated blood glucose.   Assessment/Plan:   Septic shock ([place]) secondary to aspiration pneumonia right lower lobe/acute respiratory failure with hypoxia with prolonged mechanical ventilation status post tracheostomy: Patient failed MBS remains n.p.o. only ice chips tube feedings through core track. Speech to  evaluate MBBS on 06/12/2022 they recommended a dysphagia 3 diet. Pulmonary is following trach plans capping trial.  Status post robotic hernia repair complicated by postop ileus/inflammatory enteritis: Per surgical team.  Chest pain/tachycardia: CTA negative cardiac cath nonobstructive disease now has resolved.  Hypernatremia: . Resolved with D5W now KVO.  Acute kidney injury: Likely due to ATN in the setting of cardiogenic/septic shock which required CRRT nephrology was consulted since then renal function has recovered and she has had good urine output with her creatinine is stable off HD.  Biventricular failure: In the setting of shock, stress cardiomyopathy suspected left heart cath was clean.  Seen by heart failure team on 05/13/2022. Repeated 2D echo showed recovered EF with no wall motion abnormality. Cardiology recommended to follow-up with them as an outpatient. Had a short run of V. tach continue metoprolol tachycardia potassium greater than 4 magnesium greater than 2. No further events.  Sinus tachycardia: Now improved with metoprolol.  Anemia of chronic disease: With multiple medical issues, last hemoglobin was 9.5.  Essential hypertension: Stable continue metoprolol.  Chronic low back pain: Pain medication per surgery.  Morbid obesity: Noted.  Bipolar disorder: Continue Klonopin.  Hyperglycemia with obesity Blood glucose fairly controlled with minimal insulin. Check an A1c  Pressure ulcer on the lip in the setting of ET tube placement not present on admission: Continue routine care.  Dispo: Physical therapy evaluated the patient recommended CIR.     DVT prophylaxis: hep Family Communication:none Status is: Inpatient    Code Status:     Code Status Orders  (From admission, onward)           Start     Ordered   04/24/22  0049  Full code  Continuous        04/24/22 0048           Code Status History     Date Active Date Inactive  Code Status Order ID Comments User Context   04/24/2018 2119 05/01/2018 1516 Full Code [id number]  Lewis Shock, FNP Inpatient   04/23/2018 0011 04/24/2018 2000 Full Code [id number]  Francine Graven, DO ED   09/13/2017 0134 09/15/2017 1907 Full Code [id number]  Reubin Milan, MD Inpatient   05/21/2017 2123 05/29/2017 2056 Full Code [id number]  Vicenta Aly, NP Inpatient   05/20/2017 1504 05/21/2017 2029 Full Code [id number]  Eber Jones, MD Inpatient   03/28/2016 1002 04/01/2016 1857 Full Code [id number]  Tawni Millers, MD Inpatient   07/19/2014 1907 07/29/2014 1356 Full Code [id number]  Emily Filbert, MD Inpatient   11/29/2013 1921 12/01/2013 1558 Full Code [id number]  Leone Brand, MD Inpatient   11/29/2013 1558 11/29/2013 1921 Full Code [id number]  Nolon Rod, DO ED      Advance Directive Documentation    Flowsheet Row Most Recent Value  Type of Advance Directive Healthcare Power of Attorney, Living will  [Prior]  Pre-existing out of facility DNR order (yellow form or pink MOST form) --  "MOST" Form in Place? --         IV Access:   Peripheral IV   Procedures and diagnostic studies:   No results found.   Medical Consultants:   None.   Subjective:    Patricia [name] no complaints  Objective:    Vitals:   06/15/22 1632 06/15/22 2030 06/16/22 0602 06/16/22 0751  BP: 102/83 123/88 114/88 101/88  Pulse: 79 93 88 87  Resp: '19 16 18 19  '$ Temp: 98 F (36.7 C) 98.2 F (36.8 C) 97.8 F (36.6 C) 97.8 F (36.6 C)  TempSrc: Oral Oral Oral Oral  SpO2: 97% 99% 100% 100%  Weight:      Height:       SpO2: 100 % O2 Flow Rate (L/min): 2 L/min FiO2 (%): 28 %   Intake/Output Summary (Last 24 hours) at 06/16/2022 0918 Last data filed at 06/16/2022 0602 Gross per 24 hour  Intake 10 ml  Output 400 ml  Net -390 ml    Filed Weights   06/12/22 0500 06/13/22 0500 06/15/22 0434  Weight: (!) 161.8 kg (!) 172.6 kg (!) 162.6 kg    Exam: General  exam: In no acute distress. Respiratory system: Good air movement and crackles at baseline trach has been removed. Cardiovascular system: S1 & S2 heard, RRR. No JVD. Gastrointestinal system: Abdomen is nondistended, soft and nontender.  Extremities: No pedal edema. Skin: No rashes, lesions or ulcers Psychiatry: Judgement and insight appear normal. Mood & affect appropriate.    Data Reviewed:    Labs: Basic Metabolic Panel: Recent Labs  Lab 06/10/22 0345 06/11/22 0246 06/11/22 1401 06/12/22 0407 06/13/22 0420 06/14/22 0703  NA 135 137 137 137 136 136  K 4.4 4.1 4.5 4.0 3.7 3.9  CL 104 108 108 109 106 108  CO2 19* 18* 18* 17* 19* 18*  GLUCOSE 111* 105* 111* 100* 100* 111*  BUN 26* 30* 32* 25* 24* 18  CREATININE 0.94 1.06* 0.92 1.08* 1.15* 1.13*  CALCIUM 9.6 9.6 9.7 9.5 9.5 9.5  MG  --   --  1.9  --   --   --   PHOS 4.6 5.4*  --  4.8* 4.8* 4.7*  GFR Estimated Creatinine Clearance: 90 mL/min (A) (by C-G formula based on SCr of 1.13 mg/dL (H)). Liver Function Tests: Recent Labs  Lab 06/10/22 0345 06/11/22 0246 06/12/22 0407 06/13/22 0420 06/14/22 0703  AST 28  --   --   --   --   ALT 29  --   --   --   --   ALKPHOS 104  --   --   --   --   BILITOT 0.2*  --   --   --   --   PROT 8.0  --   --   --   --   ALBUMIN 2.6*  2.6* 2.7* 2.7* 2.6* 2.7*    Recent Labs  Lab 06/10/22 0345  LIPASE 104*    No results for input(s): "AMMONIA" in the last 168 hours. Coagulation profile No results for input(s): "INR", "PROTIME" in the last 168 hours. COVID-19 Labs  No results for input(s): "DDIMER", "FERRITIN", "LDH", "CRP" in the last 72 hours.  Lab Results  Component Value Date   SARSCOV2NAA NEGATIVE 09/06/2020   SARSCOV2NAA NOT DETECTED 04/14/2019    CBC: Recent Labs  Lab 06/10/22 0345 06/11/22 0246 06/12/22 0407  WBC 13.1* 9.4 14.1*  HGB 9.0* 9.4* 9.5*  HCT 28.5* 30.9* 30.8*  MCV 87.2 90.4 88.0  PLT 424* 454* 500*    Cardiac Enzymes: No results for  input(s): "CKTOTAL", "CKMB", "CKMBINDEX", "TROPONINI" in the last 168 hours. BNP (last 3 results) No results for input(s): "PROBNP" in the last 8760 hours. CBG: Recent Labs  Lab 06/15/22 0551 06/15/22 1211 06/15/22 1807 06/15/22 2344 06/16/22 0602  GLUCAP 118* 109* 99 92 94    D-Dimer: No results for input(s): "DDIMER" in the last 72 hours. Hgb A1c: No results for input(s): "HGBA1C" in the last 72 hours.  Lipid Profile: No results for input(s): "CHOL", "HDL", "LDLCALC", "TRIG", "CHOLHDL", "LDLDIRECT" in the last 72 hours. Thyroid function studies: No results for input(s): "TSH", "T4TOTAL", "T3FREE", "THYROIDAB" in the last 72 hours.  Invalid input(s): "FREET3" Anemia work up: No results for input(s): "VITAMINB12", "FOLATE", "FERRITIN", "TIBC", "IRON", "RETICCTPCT" in the last 72 hours. Sepsis Labs: Recent Labs  Lab 06/10/22 0345 06/11/22 0246 06/12/22 0407  WBC 13.1* 9.4 14.1*    Microbiology No results found for this or any previous visit (from the past 240 hour(s)).   Medications:    Chlorhexidine Gluconate Cloth  6 each Topical Daily   feeding supplement  237 mL Oral BID BM   fiber  1 packet Per Tube TID   heparin injection (subcutaneous)  5,000 Units Subcutaneous Q8H   insulin aspart  0-20 Units Subcutaneous Q6H   insulin aspart  4 Units Subcutaneous Q6H   metoprolol tartrate  75 mg Oral BID   multivitamin with minerals  1 tablet Oral Daily   nystatin   Topical TID   mouth rinse  15 mL Mouth Rinse 4 times per day   pantoprazole  40 mg Oral Daily   polyethylene glycol  17 g Oral Daily   QUEtiapine  25 mg Oral QHS   senna  1 tablet Oral Daily   sodium chloride flush  10-40 mL Intracatheter Q12H   traMADol  100 mg Oral Q6H   Continuous Infusions:      LOS: 53 days   Charlynne Cousins  [facility]  06/16/2022, 9:18 AM

## 2022-06-17 LAB — GLUCOSE, CAPILLARY
Glucose-Capillary: 105 mg/dL — ABNORMAL HIGH (ref 70–99)
Glucose-Capillary: 113 mg/dL — ABNORMAL HIGH (ref 70–99)
Glucose-Capillary: 104 mg/dL — ABNORMAL HIGH (ref 70–99)
Glucose-Capillary: 128 mg/dL — ABNORMAL HIGH (ref 70–99)

## 2022-06-17 MED ORDER — BOOST / RESOURCE BREEZE PO LIQD CUSTOM
1.0000 | Freq: Three times a day (TID) | ORAL | Status: DC
  Administered 2022-06-17 – 2022-06-21 (×11): 1 via ORAL

## 2022-06-17 NOTE — Progress Notes (Signed)
Occupational Therapy Treatment Patient Details Name: Patricia [name] MRN: [id number] DOB: October 30, 1970 Today's Date: 06/17/2022   History of present illness 51 yo female admitted 04/23/22 for excisional hernia repair with mesh. Course complicated by post-op ileus, tachycardia, PNA, sepsis. Transfer to ICU 10/2 on levophed. Worsening acidosis 10/3 requiring intubation. Cardiac cath 10/4 with EF 15%. Pt with AKI; on CRRT 10/5-10/14. Failed extubation 10/8 and 10/14; s/p trach 10/14. PMH includes depression, OSA, asthma, schizophrenia, DM, insomnia.   OT comments  Patient received in supine and eager to participate. Patient performed grooming and bathing seated on EOB with setup and min assist for UB bathing and mod assist for LB bathing and supervision for sitting balance. Patient asking to sit in recliner and was mod assist to stand from EOB into stedy and transferred to recliner. Patient is making good gains with OT treatment and will continue to be followed by acute OT.    Recommendations for follow up therapy are one component of a multi-disciplinary discharge planning process, led by the attending physician.  Recommendations may be updated based on patient status, additional functional criteria and insurance authorization.    Follow Up Recommendations  Skilled nursing-short term rehab (<3 hours/day)     Assistance Recommended at Discharge Frequent or constant Supervision/Assistance  Patient can return home with the following  Two people to help with walking and/or transfers;A lot of help with bathing/dressing/bathroom;Assistance with cooking/housework;Help with stairs or ramp for entrance;Assist for transportation   Equipment Recommendations  Other (comment) (TBD next venue)    Recommendations for Other Services      Precautions / Restrictions Precautions Precautions: Fall Restrictions Weight Bearing Restrictions: No       Mobility Bed Mobility Overal bed mobility: Needs  Assistance Bed Mobility: Supine to Sit     Supine to sit: Mod assist     General bed mobility comments: required assistance with BLEs to get to EOB and scooting hips forward    Transfers Overall transfer level: Needs assistance Equipment used: Ambulation equipment used Transfers: Sit to/from Stand, Bed to chair/wheelchair/BSC Sit to Stand: Mod assist           General transfer comment: Patient was mod assist to stand from EOB into Stedy to transfer to recliner Transfer via Lift Equipment: Stedy   Balance Overall balance assessment: Needs assistance Sitting-balance support: No upper extremity supported, Feet supported Sitting balance-Leahy Scale: Good Sitting balance - Comments: able to perform self care tasks seated on EOB with supervision for balance.   Standing balance support: Bilateral upper extremity supported, During functional activity Standing balance-Leahy Scale: Poor Standing balance comment: reliant on Stedy for support to stand and maintain balance                           ADL either performed or assessed with clinical judgement   ADL Overall ADL's : Needs assistance/impaired Eating/Feeding: Set up   Grooming: Wash/dry hands;Wash/dry face;Oral care;Applying deodorant;Supervision/safety;Sitting Grooming Details (indicate cue type and reason): EOB Upper Body Bathing: Sitting;Minimal assistance Upper Body Bathing Details (indicate cue type and reason): assistance with back Lower Body Bathing: Moderate assistance;Sitting/lateral leans Lower Body Bathing Details (indicate cue type and reason): able to bathe UB legs seated on EOB with assistance below knees Upper Body Dressing : Minimal assistance;Sitting Upper Body Dressing Details (indicate cue type and reason): changed gowns seated on EOB Lower Body Dressing: Maximal assistance;Sitting/lateral leans Lower Body Dressing Details (indicate cue type and reason): to change socks  General ADL Comments: performed self care seated on EOB    Extremity/Trunk Assessment              Vision       Perception     Praxis      Cognition Arousal/Alertness: Awake/alert Behavior During Therapy: WFL for tasks assessed/performed Overall Cognitive Status: Within Functional Limits for tasks assessed                         Following Commands: Follows one step commands with increased time     Problem Solving: Slow processing, Requires verbal cues          Exercises      Shoulder Instructions       General Comments      Pertinent Vitals/ Pain       Pain Assessment Pain Assessment: 0-10 Pain Score: 7  Pain Location: BLEs Pain Descriptors / Indicators: Grimacing Pain Intervention(s): Limited activity within patient's tolerance, Monitored during session, Repositioned  Home Living                                          Prior Functioning/Environment              Frequency  Min 2X/week        Progress Toward Goals  OT Goals(current goals can now be found in the care plan section)  Progress towards OT goals: Progressing toward goals  Acute Rehab OT Goals Patient Stated Goal: get better OT Goal Formulation: With patient Time For Goal Achievement: 06/24/22 Potential to Achieve Goals: Good ADL Goals Pt Will Perform Grooming: with set-up;with supervision;sitting Pt Will Perform Upper Body Dressing: with set-up;with supervision;sitting Pt Will Perform Lower Body Dressing: with mod assist;with adaptive equipment;sit to/from stand Pt Will Transfer to Toilet: with min assist;with +2 assist;squat pivot transfer;bedside commode Additional ADL Goal #1: Pt will be min guard A in and OOB for basic ADLs  Plan Discharge plan remains appropriate    Co-evaluation                 AM-PAC OT "6 Clicks" Daily Activity     Outcome Measure   Help from another person eating meals?: A Little Help from another person  taking care of personal grooming?: A Little Help from another person toileting, which includes using toliet, bedpan, or urinal?: Total Help from another person bathing (including washing, rinsing, drying)?: A Lot Help from another person to put on and taking off regular upper body clothing?: A Little Help from another person to put on and taking off regular lower body clothing?: A Lot 6 Click Score: 14    End of Session Equipment Utilized During Treatment: Gait belt;Other (comment) Charlaine Dalton)  OT Visit Diagnosis: Other abnormalities of gait and mobility (R26.89);Muscle weakness (generalized) (M62.81)   Activity Tolerance Patient tolerated treatment well   Patient Left in chair;with call bell/phone within reach;with chair alarm set   Nurse Communication Mobility status;Need for lift equipment        Time: 4401-0272 OT Time Calculation (min): 36 min  Charges: OT General Charges $OT Visit: 1 Visit OT Treatments $Self Care/Home Management : 23-37 mins  Lodema Hong, [place]  Office [phone]   Trixie Dredge 06/17/2022, 2:32 PM

## 2022-06-17 NOTE — Progress Notes (Signed)
47 Days Post-Op   Subjective/Chief Complaint: Wants to go home.  No major changes  Objective: Vital signs in last 24 hours: Temp:  [97.5 F (36.4 C)-98.2 F (36.8 C)] 98.2 F (36.8 C) (11/19 2108) Pulse Rate:  [78-88] 85 (11/19 2108) Resp:  [18-20] 20 (11/19 2108) BP: (101-114)/(74-90) 114/90 (11/19 2108) SpO2:  [100 %] 100 % (11/19 2108) Last BM Date : 06/14/22  Intake/Output from previous day: 11/19 0701 - 11/20 0700 In: 830 [P.O.:830] Out: 500 [Urine:500] Intake/Output this shift: Total I/O In: 120 [P.O.:120] Out: 500 [Urine:500]  Alert, NAD, resting comfortably Normal work of breathing Clean dressing in place over trach site. Abd - abdomen soft, nontender, nondistended. Incisions well-healed.  Lab Results:  No results for input(s): "WBC", "HGB", "HCT", "PLT" in the last 72 hours. BMET Recent Labs    06/14/22 0703  NA 136  K 3.9  CL 108  CO2 18*  GLUCOSE 111*  BUN 18  CREATININE 1.13*  CALCIUM 9.5    PT/INR No results for input(s): "LABPROT", "INR" in the last 72 hours. ABG No results for input(s): "PHART", "HCO3" in the last 72 hours.  Invalid input(s): "PCO2", "PO2"  Studies/Results: No results found.  Anti-infectives: Anti-infectives (From admission, onward)    Start     Dose/Rate Route Frequency Ordered Stop   06/04/22 2100  ceFAZolin (ANCEF) IVPB 2g/100 mL premix        2 g 200 mL/hr over 30 Minutes Intravenous Every 8 hours 06/04/22 1103 06/05/22 2037   06/01/22 1515  ceFEPIme (MAXIPIME) 2 g in sodium chloride 0.9 % 100 mL IVPB  Status:  Discontinued        2 g 200 mL/hr over 30 Minutes Intravenous Every 12 hours 06/01/22 1420 06/04/22 1103   05/15/22 1445  ceFAZolin (ANCEF) IVPB 2g/100 mL premix       Note to Pharmacy: To be given in IR   2 g 200 mL/hr over 30 Minutes Intravenous  Once 05/15/22 1352 05/15/22 1616   05/15/22 1230  ceFAZolin (ANCEF) powder 1 g  Status:  Discontinued        1 g Other To Surgery 05/15/22 1140 05/15/22 1239    05/15/22 1230  ceFAZolin (ANCEF) IVPB 2g/100 mL premix        2 g 200 mL/hr over 30 Minutes Intravenous To Radiology 05/15/22 1140 05/15/22 1140   05/15/22 0000  ceFAZolin (ANCEF) IVPB 2g/100 mL premix        2 g 200 mL/hr over 30 Minutes Intravenous To Radiology 05/14/22 1308 05/15/22 0005   05/13/22 2134  meropenem (MERREM) 500 mg in sodium chloride 0.9 % 100 mL IVPB        500 mg 200 mL/hr over 30 Minutes Intravenous Every 24 hours 05/13/22 0815 05/17/22 0700   05/13/22 1000  meropenem (MERREM) 500 mg in sodium chloride 0.9 % 100 mL IVPB  Status:  Discontinued        500 mg 200 mL/hr over 30 Minutes Intravenous Every 12 hours 05/12/22 0940 05/12/22 0946   05/12/22 2200  meropenem (MERREM) 500 mg in sodium chloride 0.9 % 100 mL IVPB  Status:  Discontinued        500 mg 200 mL/hr over 30 Minutes Intravenous Every 12 hours 05/12/22 0946 05/13/22 0815   05/12/22 2000  meropenem (MERREM) 1 g in sodium chloride 0.9 % 100 mL IVPB  Status:  Discontinued        1 g 200 mL/hr over 30 Minutes Intravenous  Once  05/12/22 0940 05/12/22 0946   05/06/22 1530  meropenem (MERREM) 1 g in sodium chloride 0.9 % 100 mL IVPB  Status:  Discontinued        1 g 200 mL/hr over 30 Minutes Intravenous Every 8 hours 05/06/22 1438 05/12/22 0940   05/04/22 1000  vancomycin (VANCOCIN) IVPB 1000 mg/200 mL premix  Status:  Discontinued        1,000 mg 200 mL/hr over 60 Minutes Intravenous Every 24 hours 05/03/22 0802 05/08/22 0913   05/03/22 0845  vancomycin (VANCOREADY) IVPB 2000 mg/400 mL        2,000 mg 200 mL/hr over 120 Minutes Intravenous  Once 05/03/22 0751 05/03/22 1114   05/01/22 1800  piperacillin-tazobactam (ZOSYN) IVPB 3.375 g  Status:  Discontinued        3.375 g 12.5 mL/hr over 240 Minutes Intravenous Every 6 hours 05/01/22 1426 05/01/22 1427   05/01/22 1515  piperacillin-tazobactam (ZOSYN) IVPB 3.375 g  Status:  Discontinued        3.375 g 100 mL/hr over 30 Minutes Intravenous Every 6 hours  05/01/22 1428 05/06/22 1438   04/30/22 1400  piperacillin-tazobactam (ZOSYN) IVPB 2.25 g  Status:  Discontinued        2.25 g 100 mL/hr over 30 Minutes Intravenous Every 8 hours 04/30/22 1325 05/01/22 1426   04/29/22 1530  piperacillin-tazobactam (ZOSYN) IVPB 3.375 g  Status:  Discontinued        3.375 g 12.5 mL/hr over 240 Minutes Intravenous Every 8 hours 04/29/22 1433 04/30/22 1325   04/29/22 1145  metroNIDAZOLE (FLAGYL) IVPB 500 mg  Status:  Discontinued        500 mg 100 mL/hr over 60 Minutes Intravenous Every 12 hours 04/29/22 1047 04/29/22 1433   04/29/22 1145  vancomycin (VANCOCIN) IVPB 1000 mg/200 mL premix  Status:  Discontinued        1,000 mg 200 mL/hr over 60 Minutes Intravenous  Once 04/29/22 1047 04/29/22 1055   04/29/22 1145  vancomycin (VANCOREADY) IVPB 1500 mg/300 mL  Status:  Discontinued        1,500 mg 150 mL/hr over 120 Minutes Intravenous Every 24 hours 04/29/22 1055 04/29/22 1357   04/29/22 1045  ceFEPIme (MAXIPIME) 2 g in sodium chloride 0.9 % 100 mL IVPB  Status:  Discontinued        2 g 200 mL/hr over 30 Minutes Intravenous Every 8 hours 04/29/22 0954 04/29/22 1433   04/23/22 0730  ceFAZolin (ANCEF) IVPB 2g/100 mL premix        2 g 200 mL/hr over 30 Minutes Intravenous On call to O.R. 04/23/22 2353 04/23/22 0828       Assessment/Plan: s/p Procedure(s): Coronary/Graft Acute MI Revascularization (N/A) LEFT HEART CATH AND CORONARY ANGIOGRAPHY (N/A) RIGHT/LEFT HEART CATH AND CORONARY ANGIOGRAPHY (N/A) Advance diet Ms. Basquez is s/p robotic incisional hernia repair on 04/23/22 Will need either SNF placement, or progress with therapy to discharge home with home health. Continue D3 diet, working with SLP. Continue PT/OT - deconditioned and needs significant rehabilitation Unna boot right foot for heel pressure ulcer Out of bed to chair as much as possible    Acute metabolic encephalopathy superimposed on hx bipolar disorder, schizophrenia   Acute  biventricular HFrEF (Etiology sepsis, stress induced CM. Has clean coronaries) Aspiration PNA Pulm edema   RLL VAP  Anemia, acute on chronic  DM2 with hyperglycemia  Severe protein calorie malnutrition  Deconditioning    Appreciate [facility] care with multiple issues   Needs therapy and  recouperation     Dispo - Very close to discharge once safe plan in place with her rehab needs and abilities in mind   LOS: 52 days    Felicie Morn 06/17/2022

## 2022-06-17 NOTE — Progress Notes (Signed)
Nutrition Follow-up  DOCUMENTATION CODES:   Not applicable  INTERVENTION:  - Discontinue Ensure BID.   - Add Boost Breeze po TID, each supplement provides 250 kcal and 9 grams of protein  NUTRITION DIAGNOSIS:   Swallowing difficulty related to dysphagia as evidenced by NPO status (with need for MBS per SLP). - Improving - diet now adv to Dys 3.   GOAL:   Patient will meet greater than or equal to 90% of their needs  MONITOR:   PO intake, Supplement acceptance, Labs, Weight trends  REASON FOR ASSESSMENT:   Consult Enteral/tube feeding initiation and management (trickle tube feeds)  ASSESSMENT:   Pt with hx of bipolar disorder, HTN, HLD, GERD, chronic bronchitis, GERD, DM type 2, and multiple prior abdominal surgeries presented to hospital for planned repair of a spigelian hernia.  Meds include: nutrisource fiber, sliding scale insulin, MVI, miralax, senokot.   Cortrak has been removed since last assessment. Pt's diet as has been advanced since last assessment to Dys 3. Pt reports that she ate 100% of her breakfast this am. Pt reports that she only ate a small amount of her lunch today because she was not very hungry. Pt also states that she is not drinking the Ensure shakes because she does not like them. RD will discontinue supplements. Pt agreed to try Boost Breeze TID. Will continue to monitor PO intakes.   Diet Order:   Diet Order             DIET DYS 3 Room service appropriate? Yes with Assist; Fluid consistency: Thin  Diet effective now                   EDUCATION NEEDS:   Education needs have been addressed  Skin:  Skin Assessment: Reviewed RN Assessment Skin Integrity Issues:: Stage I DTI: upper lip from ET tube Stage I: heel  Last BM:  06/16/22  Height:   Ht Readings from Last 1 Encounters:  05/22/22 '5\' 4"'$  (1.626 m)    Weight:   Wt Readings from Last 1 Encounters:  06/15/22 (!) 162.6 kg    Ideal Body Weight:  54.5 kg  BMI:  Body mass  index is 61.53 kg/m.  Estimated Nutritional Needs:   Kcal:  1900-2200 kcal  Protein:  80-110 g  Fluid:  1900-2000 mL  Thalia Bloodgood, RD, LDN, CNSC.

## 2022-06-18 LAB — RENAL FUNCTION PANEL
Albumin: 2.7 g/dL — ABNORMAL LOW (ref 3.5–5.0)
Anion gap: 14 (ref 5–15)
BUN: 16 mg/dL (ref 6–20)
CO2: 20 mmol/L — ABNORMAL LOW (ref 22–32)
Calcium: 9.6 mg/dL (ref 8.9–10.3)
Chloride: 104 mmol/L (ref 98–111)
Creatinine, Ser: 1.26 mg/dL — ABNORMAL HIGH (ref 0.44–1.00)
GFR, Estimated: 51 mL/min — ABNORMAL LOW (ref >60)
Glucose, Bld: 116 mg/dL — ABNORMAL HIGH (ref 70–99)
Phosphorus: 4.6 mg/dL (ref 2.5–4.6)
Potassium: 3.9 mmol/L (ref 3.5–5.1)
Sodium: 138 mmol/L (ref 135–145)

## 2022-06-18 LAB — GLUCOSE, CAPILLARY
Glucose-Capillary: 127 mg/dL — ABNORMAL HIGH (ref 70–99)
Glucose-Capillary: 134 mg/dL — ABNORMAL HIGH (ref 70–99)
Glucose-Capillary: 106 mg/dL — ABNORMAL HIGH (ref 70–99)

## 2022-06-18 NOTE — TOC CM/SW Note (Cosign Needed)
    Durable Medical Equipment  (From admission, onward)           Start     Ordered   06/18/22 1405  For home use only DME Walker rolling  Once       Question Answer Comment  Walker: With Steele Wheels   Patient needs a walker to treat with the following condition Weakness      06/18/22 1406   06/18/22 1405  For home use only DME standard manual wheelchair with seat cushion  Once       Comments: Patient suffers from s/p robotic incisional hernia repair, aspiration pneumonia  which impairs their ability to perform daily activities like ambulating  in the home.  A cane  will not resolve issue with performing activities of daily living. A wheelchair will allow patient to safely perform daily activities. Patient can safely propel the wheelchair in the home or has a caregiver who can provide assistance. Length of need lifetime . Accessories: elevating leg rests (ELRs), wheel locks, extensions and anti-tippers.  Seat and back cushions   06/18/22 1406   06/18/22 1403  For home use only DME Other see comment  Once       Comments: DX incontinence   Diapers, pads , gloves wipes  Question:  Length of Need  Answer:  6 Months   06/18/22 1404

## 2022-06-18 NOTE — TOC Progression Note (Signed)
Transition of Care [facility]) - Progression Note    Patient Details  Name: Patricia [name] MRN: [id number] Date of Birth: [birth year]  Transition of Care [facility]) CM/SW [place], [place] Phone Number: 06/18/2022, 12:40 PM  Clinical Narrative:    CSW was notified by pt's sister Brayton Layman, that pt and other sister [name] would like for pt to return home. CSW met with pt at bedside and spoke with [place] on speaker. Both confirm their plan for pt to return home. Latricia came and saw PT work with pt and is comfortable with pt's level of care. Pt [facility], and was advised pt would have to have OP PT, as HH was not covered by insurance. Pt was notified of Medicaid transport. Family requests a wheel chair and a walker. Pt states she gets her DME from [facility]. Pt requesting an electric wheelchair, CSW explained that there was a process to get those, and they are not provided in the hospital. CSW consulted CM for assistance with home set up. TOC will continue to follow  for DC needs.   Expected Discharge Plan: [place] Barriers to Discharge: Continued Medical Work up  Expected Discharge Plan and Services Expected Discharge Plan: [place]   Discharge Planning Services: CM Consult   Living arrangements for the past 2 months: Single Family Home                                       Social Determinants of Health (SDOH) Interventions    Readmission Risk Interventions     No data to display

## 2022-06-18 NOTE — Progress Notes (Addendum)
Speech Language Pathology Treatment: Dysphagia  Patient Details Name: Patricia [name] MRN: [id number] DOB: Mar 21, 1971 Today's Date: 06/18/2022 Time: 4098-1191 SLP Time Calculation (min) (ACUTE ONLY): 27 min  Assessment / Plan / Recommendation Clinical Impression  Pt was seen to assess for diet advancement.  She consumed crackers and thin liquids with thorough mastication, no s/s of aspiration. Mod I for cues. She is ready to advance to regular solids. Diet ordered.  [name] enthusiastically placed her lunch and dinner orders. She demonstrated excellent ability to negotiate phone use, read the menu, place her order. She has made strong gains.    Continue SLP.    HPI HPI: Patricia [name] is a 51 y.o. female who presented to hospital for excisional hernia repair (robotic hernia repair 04/23/22) with mesh, ileus post op on TNA. Developed vomiting and hypotension after NG came out on 04/29/22 and PNA on CXR, was tachycardic and BP improved with IV fluids. DX with sepsis and ABX started. Pt admitted to ICU on 10/2.  Was placed on levophed. Early AM on 10/3 was intubated for worsening metabolic/lactic acidosis. 10/4 cardiac cath with EF 15%; She had AKI as well. Started CRRT. Extubation 10/14 failed; underwent tracheostomy. TC trials started 10/16. Cleared by surgery to attempt POs 10/27. MBS 11/3 with severe dysphagia and silent aspiration of all consistencies. 11/14 trach down-sized to 4 cuffless. MBS 11/15- significant improvements, started dysphagia 3/thin liquids. Dacannulated same date.      SLP Plan  Continue with current plan of care      Recommendations for follow up therapy are one component of a multi-disciplinary discharge planning process, led by the attending physician.  Recommendations may be updated based on patient status, additional functional criteria and insurance authorization.    Recommendations  Diet recommendations: Regular;Thin liquid Liquids provided via: Cup;No  straw Medication Administration: Whole meds with liquid Supervision: Intermittent supervision to cue for compensatory strategies Compensations: Slow rate Postural Changes and/or Swallow Maneuvers: Seated upright 90 degrees                Oral Care Recommendations: Oral care BID Follow Up Recommendations: No SLP follow up Assistance recommended at discharge: Intermittent Supervision/Assistance SLP Visit Diagnosis: Dysphagia, pharyngeal phase (R13.13) Plan: Continue with current plan of care        [name] L. Tivis Ringer, MA CCC/SLP Clinical Specialist - Acute Care SLP Acute Rehabilitation Services Office number [phone]    [name]  06/18/2022, 10:43 AM

## 2022-06-18 NOTE — Progress Notes (Signed)
48 Days Post-Op   Subjective/Chief Complaint: Getting excited to go home.  Working on logistics  Objective: Vital signs in last 24 hours: Temp:  [97.6 F (36.4 C)-98.1 F (36.7 C)] 97.6 F (36.4 C) (11/21 0740) Pulse Rate:  [81-93] 90 (11/21 0949) Resp:  [15] 15 (11/21 0740) BP: (110-133)/(76-98) 118/76 (11/21 0949) SpO2:  [100 %] 100 % (11/21 0740) Last BM Date : 06/14/22  Intake/Output from previous day: 11/20 0701 - 11/21 0700 In: 300 [P.O.:300] Out: -  Intake/Output this shift: No intake/output data recorded.  Alert, NAD, resting comfortably Normal work of breathing Clean dressing in place over trach site. Abd - abdomen soft, nontender, nondistended. Incisions well-healed.  Lab Results:  No results for input(s): "WBC", "HGB", "HCT", "PLT" in the last 72 hours. BMET Recent Labs    06/18/22 0305  NA 138  K 3.9  CL 104  CO2 20*  GLUCOSE 116*  BUN 16  CREATININE 1.26*  CALCIUM 9.6    PT/INR No results for input(s): "LABPROT", "INR" in the last 72 hours. ABG No results for input(s): "PHART", "HCO3" in the last 72 hours.  Invalid input(s): "PCO2", "PO2"  Studies/Results: No results found.  Anti-infectives: Anti-infectives (From admission, onward)    Start     Dose/Rate Route Frequency Ordered Stop   06/04/22 2100  ceFAZolin (ANCEF) IVPB 2g/100 mL premix        2 g 200 mL/hr over 30 Minutes Intravenous Every 8 hours 06/04/22 1103 06/05/22 2037   06/01/22 1515  ceFEPIme (MAXIPIME) 2 g in sodium chloride 0.9 % 100 mL IVPB  Status:  Discontinued        2 g 200 mL/hr over 30 Minutes Intravenous Every 12 hours 06/01/22 1420 06/04/22 1103   05/15/22 1445  ceFAZolin (ANCEF) IVPB 2g/100 mL premix       Note to Pharmacy: To be given in IR   2 g 200 mL/hr over 30 Minutes Intravenous  Once 05/15/22 1352 05/15/22 1616   05/15/22 1230  ceFAZolin (ANCEF) powder 1 g  Status:  Discontinued        1 g Other To Surgery 05/15/22 1140 05/15/22 1239   05/15/22 1230   ceFAZolin (ANCEF) IVPB 2g/100 mL premix        2 g 200 mL/hr over 30 Minutes Intravenous To Radiology 05/15/22 1140 05/15/22 1140   05/15/22 0000  ceFAZolin (ANCEF) IVPB 2g/100 mL premix        2 g 200 mL/hr over 30 Minutes Intravenous To Radiology 05/14/22 1308 05/15/22 0005   05/13/22 2134  meropenem (MERREM) 500 mg in sodium chloride 0.9 % 100 mL IVPB        500 mg 200 mL/hr over 30 Minutes Intravenous Every 24 hours 05/13/22 0815 05/17/22 0700   05/13/22 1000  meropenem (MERREM) 500 mg in sodium chloride 0.9 % 100 mL IVPB  Status:  Discontinued        500 mg 200 mL/hr over 30 Minutes Intravenous Every 12 hours 05/12/22 0940 05/12/22 0946   05/12/22 2200  meropenem (MERREM) 500 mg in sodium chloride 0.9 % 100 mL IVPB  Status:  Discontinued        500 mg 200 mL/hr over 30 Minutes Intravenous Every 12 hours 05/12/22 0946 05/13/22 0815   05/12/22 2000  meropenem (MERREM) 1 g in sodium chloride 0.9 % 100 mL IVPB  Status:  Discontinued        1 g 200 mL/hr over 30 Minutes Intravenous  Once 05/12/22 0940 05/12/22  3614   05/06/22 1530  meropenem (MERREM) 1 g in sodium chloride 0.9 % 100 mL IVPB  Status:  Discontinued        1 g 200 mL/hr over 30 Minutes Intravenous Every 8 hours 05/06/22 1438 05/12/22 0940   05/04/22 1000  vancomycin (VANCOCIN) IVPB 1000 mg/200 mL premix  Status:  Discontinued        1,000 mg 200 mL/hr over 60 Minutes Intravenous Every 24 hours 05/03/22 0802 05/08/22 0913   05/03/22 0845  vancomycin (VANCOREADY) IVPB 2000 mg/400 mL        2,000 mg 200 mL/hr over 120 Minutes Intravenous  Once 05/03/22 0751 05/03/22 1114   05/01/22 1800  piperacillin-tazobactam (ZOSYN) IVPB 3.375 g  Status:  Discontinued        3.375 g 12.5 mL/hr over 240 Minutes Intravenous Every 6 hours 05/01/22 1426 05/01/22 1427   05/01/22 1515  piperacillin-tazobactam (ZOSYN) IVPB 3.375 g  Status:  Discontinued        3.375 g 100 mL/hr over 30 Minutes Intravenous Every 6 hours 05/01/22 1428 05/06/22  1438   04/30/22 1400  piperacillin-tazobactam (ZOSYN) IVPB 2.25 g  Status:  Discontinued        2.25 g 100 mL/hr over 30 Minutes Intravenous Every 8 hours 04/30/22 1325 05/01/22 1426   04/29/22 1530  piperacillin-tazobactam (ZOSYN) IVPB 3.375 g  Status:  Discontinued        3.375 g 12.5 mL/hr over 240 Minutes Intravenous Every 8 hours 04/29/22 1433 04/30/22 1325   04/29/22 1145  metroNIDAZOLE (FLAGYL) IVPB 500 mg  Status:  Discontinued        500 mg 100 mL/hr over 60 Minutes Intravenous Every 12 hours 04/29/22 1047 04/29/22 1433   04/29/22 1145  vancomycin (VANCOCIN) IVPB 1000 mg/200 mL premix  Status:  Discontinued        1,000 mg 200 mL/hr over 60 Minutes Intravenous  Once 04/29/22 1047 04/29/22 1055   04/29/22 1145  vancomycin (VANCOREADY) IVPB 1500 mg/300 mL  Status:  Discontinued        1,500 mg 150 mL/hr over 120 Minutes Intravenous Every 24 hours 04/29/22 1055 04/29/22 1357   04/29/22 1045  ceFEPIme (MAXIPIME) 2 g in sodium chloride 0.9 % 100 mL IVPB  Status:  Discontinued        2 g 200 mL/hr over 30 Minutes Intravenous Every 8 hours 04/29/22 0954 04/29/22 1433   04/23/22 0730  ceFAZolin (ANCEF) IVPB 2g/100 mL premix        2 g 200 mL/hr over 30 Minutes Intravenous On call to O.R. 04/23/22 4315 04/23/22 0828       Assessment/Plan: s/p Procedure(s): Coronary/Graft Acute MI Revascularization (N/A) LEFT HEART CATH AND CORONARY ANGIOGRAPHY (N/A) RIGHT/LEFT HEART CATH AND CORONARY ANGIOGRAPHY (N/A) Advance diet Ms. Ruddy is s/p robotic incisional hernia repair on 04/23/22 Plan for going home with home health Working with [facility] team to set up a safe discharge home with home health Discussed patient's care with her sister who will be primary care giver - sister feels comfortable with patient coming home Continue D3 diet, working with SLP. Continue PT/OT - deconditioned and needs significant rehabilitation Unna boot right foot for heel pressure ulcer Out of bed to chair as much  as possible    Acute metabolic encephalopathy superimposed on hx bipolar disorder, schizophrenia   Acute biventricular HFrEF (Etiology sepsis, stress induced CM. Has clean coronaries) Aspiration PNA Pulm edema   RLL VAP  Anemia, acute on chronic  DM2 with  hyperglycemia  Severe protein calorie malnutrition  Deconditioning    Appreciate [facility] care with multiple issues   Needs therapy and recouperation     Dispo - Very close to discharge once safe plan in place with her rehab needs and abilities in mind   LOS: 55 days    Felicie Morn 06/18/2022

## 2022-06-18 NOTE — TOC CM/SW Note (Addendum)
Patient wants to go home at discharge. Spoke to patient and her sister via phone.   Sister's address is [address] , [place] [address]   Patient would like OP therapy through [facility] location will need orders . Orders entered   Patient wants to continue receiving aides through [facility] [phone] and not sure if she needs a form signed by MD or not. NCM called same. Spoke to [employer] . Patient can continue with her current hours but patient wants to increase her hours so MD will need to sign a form. Vickie will fax form to NCM . Received form, placed on chart . Once signed , will need to be faxed to number on form . Form faxed    Patient also requesting an order for diapers, pads and wipes to be sent to [facility] [phone]. Called [employer] spoke with Christie . She would need a MD order for diapers, pads and gloves with dx of incontinence. Orders faxed to [facility]   Patient also would like a walker and wheel chair for home. Patient aware insurance may only cover one, if so she wants wheel chair . Would need orders.   [facility] transferred to triage nurse , left voicemail awaiting call back.   [place] Dr Thermon Leyland returned call and agreed with all of above. He will sign form to increase personal care hours this afternoon once he makes rounds . Entered orders for OP PT,OT and SP . Entered order for diapers pads wipes and gloves once signed will fax.   Entered orders for walker and wheelchair , asked OT to co sign progress note . Called Lac[facility] x 2 with [facility] for DME . Confirmed she received message

## 2022-06-19 LAB — GLUCOSE, CAPILLARY
Glucose-Capillary: 112 mg/dL — ABNORMAL HIGH (ref 70–99)
Glucose-Capillary: 117 mg/dL — ABNORMAL HIGH (ref 70–99)
Glucose-Capillary: 97 mg/dL (ref 70–99)
Glucose-Capillary: 98 mg/dL (ref 70–99)

## 2022-06-19 MED ORDER — TRAMADOL HCL 50 MG PO TABS: 50.0000 mg | ORAL_TABLET | Freq: Four times a day (QID) | ORAL | 0 refills | Status: DC | PRN

## 2022-06-19 NOTE — Plan of Care (Signed)
  Problem: Education: Goal: Knowledge of General Education information will improve Description: Including pain rating scale, medication(s)/side effects and non-pharmacologic comfort measures Outcome: Progressing   Problem: Health Behavior/Discharge Planning: Goal: Ability to manage health-related needs will improve Outcome: Progressing   Problem: Clinical Measurements: Goal: Ability to maintain clinical measurements within normal limits will improve Outcome: Progressing Goal: Will remain free from infection Outcome: Progressing Goal: Diagnostic test results will improve Outcome: Progressing Goal: Respiratory complications will improve Outcome: Progressing Goal: Cardiovascular complication will be avoided Outcome: Progressing   Problem: Activity: Goal: Risk for activity intolerance will decrease Outcome: Progressing   Problem: Nutrition: Goal: Adequate nutrition will be maintained Outcome: Progressing   Problem: Coping: Goal: Level of anxiety will decrease Outcome: Progressing   Problem: Elimination: Goal: Will not experience complications related to bowel motility Outcome: Progressing Goal: Will not experience complications related to urinary retention Outcome: Progressing   Problem: Pain Managment: Goal: General experience of comfort will improve Outcome: Progressing   Problem: Safety: Goal: Ability to remain free from injury will improve Outcome: Progressing   Problem: Skin Integrity: Goal: Risk for impaired skin integrity will decrease Outcome: Progressing   Problem: Fluid Volume: Goal: Hemodynamic stability will improve Outcome: Progressing   Problem: Clinical Measurements: Goal: Diagnostic test results will improve Outcome: Progressing Goal: Signs and symptoms of infection will decrease Outcome: Progressing   Problem: Respiratory: Goal: Ability to maintain adequate ventilation will improve Outcome: Progressing   Problem: Education: Goal: Ability  to describe self-care measures that may prevent or decrease complications (Diabetes Survival Skills Education) will improve Outcome: Progressing Goal: Individualized Educational Video(s) Outcome: Progressing   Problem: Coping: Goal: Ability to adjust to condition or change in health will improve Outcome: Progressing   Problem: Fluid Volume: Goal: Ability to maintain a balanced intake and output will improve Outcome: Progressing   Problem: Health Behavior/Discharge Planning: Goal: Ability to identify and utilize available resources and services will improve Outcome: Progressing Goal: Ability to manage health-related needs will improve Outcome: Progressing   Problem: Metabolic: Goal: Ability to maintain appropriate glucose levels will improve Outcome: Progressing   Problem: Nutritional: Goal: Maintenance of adequate nutrition will improve Outcome: Progressing Goal: Progress toward achieving an optimal weight will improve Outcome: Progressing   Problem: Skin Integrity: Goal: Risk for impaired skin integrity will decrease Outcome: Progressing   Problem: Tissue Perfusion: Goal: Adequacy of tissue perfusion will improve Outcome: Progressing   Problem: Activity: Goal: Ability to tolerate increased activity will improve Outcome: Progressing   Problem: Respiratory: Goal: Ability to maintain a clear airway and adequate ventilation will improve Outcome: Progressing   Problem: Role Relationship: Goal: Method of communication will improve Outcome: Progressing   Problem: Education: Goal: Understanding of CV disease, CV risk reduction, and recovery process will improve Outcome: Progressing Goal: Individualized Educational Video(s) Outcome: Progressing   Problem: Activity: Goal: Ability to return to baseline activity level will improve Outcome: Progressing   Problem: Cardiovascular: Goal: Ability to achieve and maintain adequate cardiovascular perfusion will  improve Outcome: Progressing Goal: Vascular access site(s) Level 0-1 will be maintained Outcome: Progressing   Problem: Health Behavior/Discharge Planning: Goal: Ability to safely manage health-related needs after discharge will improve Outcome: Progressing   Problem: Education: Goal: 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 Outcome: Progressing   Problem: Activity: Goal: Ability to tolerate increased activity will improve Outcome: Progressing   Problem: Health Behavior/Discharge Planning: Goal: Ability to manage 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 Outcome: Progressing   Problem: Respiratory: Goal: Patent airway maintenance will improve Outcome: Progressing

## 2022-06-19 NOTE — Plan of Care (Signed)
Problem: Education: Goal: Knowledge of General Education information will improve Description: Including pain rating scale, medication(s)/side effects and non-pharmacologic comfort measures 06/19/2022 0801 by Particia Lather, RN Outcome: Progressing 06/19/2022 0801 by Particia Lather, RN Outcome: Progressing   Problem: Health Behavior/Discharge Planning: Goal: Ability to manage health-related needs will improve 06/19/2022 0801 by Particia Lather, RN Outcome: Progressing 06/19/2022 0801 by Particia Lather, RN Outcome: Progressing   Problem: Clinical Measurements: Goal: Ability to maintain clinical measurements within normal limits will improve 06/19/2022 0801 by Particia Lather, RN Outcome: Progressing 06/19/2022 0801 by Particia Lather, RN Outcome: Progressing Goal: Will remain free from infection 06/19/2022 0801 by Particia Lather, RN Outcome: Progressing 06/19/2022 0801 by Particia Lather, RN Outcome: Progressing Goal: Diagnostic test results will improve 06/19/2022 0801 by Particia Lather, RN Outcome: Progressing 06/19/2022 0801 by Particia Lather, RN Outcome: Progressing Goal: Respiratory complications will improve 06/19/2022 0801 by Particia Lather, RN Outcome: Progressing 06/19/2022 0801 by Particia Lather, RN Outcome: Progressing Goal: Cardiovascular complication will be avoided 06/19/2022 0801 by Particia Lather, RN Outcome: Progressing 06/19/2022 0801 by Particia Lather, RN Outcome: Progressing   Problem: Activity: Goal: Risk for activity intolerance will decrease 06/19/2022 0801 by Particia Lather, RN Outcome: Progressing 06/19/2022 0801 by Particia Lather, RN Outcome: Progressing   Problem: Nutrition: Goal: Adequate nutrition will be maintained 06/19/2022 0801 by Particia Lather, RN Outcome: Progressing 06/19/2022 0801 by Particia Lather, RN Outcome: Progressing   Problem: Coping: Goal: Level of anxiety will  decrease 06/19/2022 0801 by Particia Lather, RN Outcome: Progressing 06/19/2022 0801 by Particia Lather, RN Outcome: Progressing   Problem: Elimination: Goal: Will not experience complications related to bowel motility 06/19/2022 0801 by Particia Lather, RN Outcome: Progressing 06/19/2022 0801 by Particia Lather, RN Outcome: Progressing Goal: Will not experience complications related to urinary retention 06/19/2022 0801 by Particia Lather, RN Outcome: Progressing 06/19/2022 0801 by Particia Lather, RN Outcome: Progressing   Problem: Pain Managment: Goal: General experience of comfort will improve 06/19/2022 0801 by Particia Lather, RN Outcome: Progressing 06/19/2022 0801 by Particia Lather, RN Outcome: Progressing   Problem: Safety: Goal: Ability to remain free from injury will improve 06/19/2022 0801 by Particia Lather, RN Outcome: Progressing 06/19/2022 0801 by Particia Lather, RN Outcome: Progressing   Problem: Skin Integrity: Goal: Risk for impaired skin integrity will decrease 06/19/2022 0801 by Particia Lather, RN Outcome: Progressing 06/19/2022 0801 by Particia Lather, RN Outcome: Progressing   Problem: Fluid Volume: Goal: Hemodynamic stability will improve 06/19/2022 0801 by Particia Lather, RN Outcome: Progressing 06/19/2022 0801 by Particia Lather, RN Outcome: Progressing   Problem: Clinical Measurements: Goal: Diagnostic test results will improve 06/19/2022 0801 by Particia Lather, RN Outcome: Progressing 06/19/2022 0801 by Particia Lather, RN Outcome: Progressing Goal: Signs and symptoms of infection will decrease 06/19/2022 0801 by Particia Lather, RN Outcome: Progressing 06/19/2022 0801 by Particia Lather, RN Outcome: Progressing   Problem: Respiratory: Goal: Ability to maintain adequate ventilation will improve 06/19/2022 0801 by Particia Lather, RN Outcome: Progressing 06/19/2022 0801 by Particia Lather, RN Outcome: Progressing   Problem: Education: Goal: Ability to describe self-care measures that may prevent or decrease complications (Diabetes Survival Skills Education) will improve 06/19/2022 0801 by Particia Lather, RN Outcome: Progressing 06/19/2022 0801 by Particia Lather, RN Outcome: Progressing Goal: Individualized Educational Video(s) 06/19/2022 0801 by Particia Lather,  RN Outcome: Progressing 06/19/2022 0801 by Particia Lather, RN Outcome: Progressing   Problem: Coping: Goal: Ability to adjust to condition or change in health will improve 06/19/2022 0801 by Particia Lather, RN Outcome: Progressing 06/19/2022 0801 by Particia Lather, RN Outcome: Progressing   Problem: Fluid Volume: Goal: Ability to maintain a balanced intake and output will improve 06/19/2022 0801 by Particia Lather, RN Outcome: Progressing 06/19/2022 0801 by Particia Lather, RN Outcome: Progressing   Problem: Health Behavior/Discharge Planning: Goal: Ability to identify and utilize available resources and services will improve 06/19/2022 0801 by Particia Lather, RN Outcome: Progressing 06/19/2022 0801 by Particia Lather, RN Outcome: Progressing Goal: Ability to manage health-related needs will improve 06/19/2022 0801 by Particia Lather, RN Outcome: Progressing 06/19/2022 0801 by Particia Lather, RN Outcome: Progressing   Problem: Metabolic: Goal: Ability to maintain appropriate glucose levels will improve 06/19/2022 0801 by Particia Lather, RN Outcome: Progressing 06/19/2022 0801 by Particia Lather, RN Outcome: Progressing   Problem: Nutritional: Goal: Maintenance of adequate nutrition will improve 06/19/2022 0801 by Particia Lather, RN Outcome: Progressing 06/19/2022 0801 by Particia Lather, RN Outcome: Progressing Goal: Progress toward achieving an optimal weight will improve 06/19/2022 0801 by Particia Lather, RN Outcome:  Progressing 06/19/2022 0801 by Particia Lather, RN Outcome: Progressing   Problem: Skin Integrity: Goal: Risk for impaired skin integrity will decrease 06/19/2022 0801 by Particia Lather, RN Outcome: Progressing 06/19/2022 0801 by Particia Lather, RN Outcome: Progressing   Problem: Tissue Perfusion: Goal: Adequacy of tissue perfusion will improve 06/19/2022 0801 by Particia Lather, RN Outcome: Progressing 06/19/2022 0801 by Particia Lather, RN Outcome: Progressing   Problem: Activity: Goal: Ability to tolerate increased activity will improve 06/19/2022 0801 by Particia Lather, RN Outcome: Progressing 06/19/2022 0801 by Particia Lather, RN Outcome: Progressing   Problem: Respiratory: Goal: Ability to maintain a clear airway and adequate ventilation will improve 06/19/2022 0801 by Particia Lather, RN Outcome: Progressing 06/19/2022 0801 by Particia Lather, RN Outcome: Progressing   Problem: Role Relationship: Goal: Method of communication will improve 06/19/2022 0801 by Particia Lather, RN Outcome: Progressing 06/19/2022 0801 by Particia Lather, RN Outcome: Progressing   Problem: Education: Goal: Understanding of CV disease, CV risk reduction, and recovery process will improve 06/19/2022 0801 by Particia Lather, RN Outcome: Progressing 06/19/2022 0801 by Particia Lather, RN Outcome: Progressing Goal: Individualized Educational Video(s) 06/19/2022 0801 by Particia Lather, RN Outcome: Progressing 06/19/2022 0801 by Particia Lather, RN Outcome: Progressing   Problem: Activity: Goal: Ability to return to baseline activity level will improve 06/19/2022 0801 by Particia Lather, RN Outcome: Progressing 06/19/2022 0801 by Particia Lather, RN Outcome: Progressing   Problem: Cardiovascular: Goal: Ability to achieve and maintain adequate cardiovascular perfusion will improve 06/19/2022 0801 by Particia Lather, RN Outcome:  Progressing 06/19/2022 0801 by Particia Lather, RN Outcome: Progressing Goal: Vascular access site(s) Level 0-1 will be maintained 06/19/2022 0801 by Particia Lather, RN Outcome: Progressing 06/19/2022 0801 by Particia Lather, RN Outcome: Progressing   Problem: Health Behavior/Discharge Planning: Goal: Ability to safely manage health-related needs after discharge will improve 06/19/2022 0801 by Particia Lather, RN Outcome: Progressing 06/19/2022 0801 by Particia Lather, RN Outcome: Progressing   Problem: Education: Goal: 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 06/19/2022 0801 by Particia Lather, RN Outcome: Progressing 06/19/2022 0801 by Particia Lather, RN Outcome: Progressing  Problem: Activity: Goal: Ability to tolerate increased activity will improve 06/19/2022 0801 by Particia Lather, RN Outcome: Progressing 06/19/2022 0801 by Particia Lather, RN Outcome: Progressing   Problem: Health Behavior/Discharge Planning: Goal: Ability to manage 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 06/19/2022 0801 by Particia Lather, RN Outcome: Progressing 06/19/2022 0801 by Particia Lather, RN Outcome: Progressing   Problem: Respiratory: Goal: Patent airway maintenance will improve 06/19/2022 0801 by Particia Lather, RN Outcome: Progressing 06/19/2022 0801 by Particia Lather, RN Outcome: Progressing   Problem: Education: Goal: Knowledge of General Education information will improve Description: Including pain rating scale, medication(s)/side effects and non-pharmacologic comfort measures 06/19/2022 0801 by Particia Lather, RN Outcome: Progressing 06/19/2022 0801 by Particia Lather, RN Outcome: Progressing   Problem: Health Behavior/Discharge Planning: Goal: Ability to manage health-related needs will improve 06/19/2022 0801 by Particia Lather, RN Outcome: Progressing 06/19/2022 0801 by Particia Lather, RN Outcome: Progressing    Problem: Clinical Measurements: Goal: Ability to maintain clinical measurements within normal limits will improve 06/19/2022 0801 by Particia Lather, RN Outcome: Progressing 06/19/2022 0801 by Particia Lather, RN Outcome: Progressing Goal: Will remain free from infection 06/19/2022 0801 by Particia Lather, RN Outcome: Progressing 06/19/2022 0801 by Particia Lather, RN Outcome: Progressing Goal: Diagnostic test results will improve 06/19/2022 0801 by Particia Lather, RN Outcome: Progressing 06/19/2022 0801 by Particia Lather, RN Outcome: Progressing Goal: Respiratory complications will improve 06/19/2022 0801 by Particia Lather, RN Outcome: Progressing 06/19/2022 0801 by Particia Lather, RN Outcome: Progressing Goal: Cardiovascular complication will be avoided 06/19/2022 0801 by Particia Lather, RN Outcome: Progressing 06/19/2022 0801 by Particia Lather, RN Outcome: Progressing   Problem: Activity: Goal: Risk for activity intolerance will decrease 06/19/2022 0801 by Particia Lather, RN Outcome: Progressing 06/19/2022 0801 by Particia Lather, RN Outcome: Progressing   Problem: Nutrition: Goal: Adequate nutrition will be maintained 06/19/2022 0801 by Particia Lather, RN Outcome: Progressing 06/19/2022 0801 by Particia Lather, RN Outcome: Progressing   Problem: Coping: Goal: Level of anxiety will decrease 06/19/2022 0801 by Particia Lather, RN Outcome: Progressing 06/19/2022 0801 by Particia Lather, RN Outcome: Progressing   Problem: Elimination: Goal: Will not experience complications related to bowel motility 06/19/2022 0801 by Particia Lather, RN Outcome: Progressing 06/19/2022 0801 by Particia Lather, RN Outcome: Progressing Goal: Will not experience complications related to urinary retention 06/19/2022 0801 by Particia Lather, RN Outcome: Progressing 06/19/2022 0801 by Particia Lather, RN Outcome: Progressing    Problem: Pain Managment: Goal: General experience of comfort will improve 06/19/2022 0801 by Particia Lather, RN Outcome: Progressing 06/19/2022 0801 by Particia Lather, RN Outcome: Progressing   Problem: Safety: Goal: Ability to remain free from injury will improve 06/19/2022 0801 by Particia Lather, RN Outcome: Progressing 06/19/2022 0801 by Particia Lather, RN Outcome: Progressing   Problem: Skin Integrity: Goal: Risk for impaired skin integrity will decrease 06/19/2022 0801 by Particia Lather, RN Outcome: Progressing 06/19/2022 0801 by Particia Lather, RN Outcome: Progressing   Problem: Fluid Volume: Goal: Hemodynamic stability will improve 06/19/2022 0801 by Particia Lather, RN Outcome: Progressing 06/19/2022 0801 by Particia Lather, RN Outcome: Progressing   Problem: Clinical Measurements: Goal: Diagnostic test results will improve 06/19/2022 0801 by Particia Lather, RN Outcome: Progressing 06/19/2022 0801 by Particia Lather, RN Outcome: Progressing Goal: Signs and symptoms of infection will decrease 06/19/2022 0801 by Particia Lather, RN Outcome: Progressing  06/19/2022 0801 by Particia Lather, RN Outcome: Progressing   Problem: Respiratory: Goal: Ability to maintain adequate ventilation will improve 06/19/2022 0801 by Particia Lather, RN Outcome: Progressing 06/19/2022 0801 by Particia Lather, RN Outcome: Progressing   Problem: Education: Goal: Ability to describe self-care measures that may prevent or decrease complications (Diabetes Survival Skills Education) will improve 06/19/2022 0801 by Particia Lather, RN Outcome: Progressing 06/19/2022 0801 by Particia Lather, RN Outcome: Progressing Goal: Individualized Educational Video(s) 06/19/2022 0801 by Particia Lather, RN Outcome: Progressing 06/19/2022 0801 by Particia Lather, RN Outcome: Progressing   Problem: Coping: Goal: Ability to adjust to condition or  change in health will improve 06/19/2022 0801 by Particia Lather, RN Outcome: Progressing 06/19/2022 0801 by Particia Lather, RN Outcome: Progressing   Problem: Fluid Volume: Goal: Ability to maintain a balanced intake and output will improve 06/19/2022 0801 by Particia Lather, RN Outcome: Progressing 06/19/2022 0801 by Particia Lather, RN Outcome: Progressing   Problem: Health Behavior/Discharge Planning: Goal: Ability to identify and utilize available resources and services will improve 06/19/2022 0801 by Particia Lather, RN Outcome: Progressing 06/19/2022 0801 by Particia Lather, RN Outcome: Progressing Goal: Ability to manage health-related needs will improve 06/19/2022 0801 by Particia Lather, RN Outcome: Progressing 06/19/2022 0801 by Particia Lather, RN Outcome: Progressing   Problem: Metabolic: Goal: Ability to maintain appropriate glucose levels will improve 06/19/2022 0801 by Particia Lather, RN Outcome: Progressing 06/19/2022 0801 by Particia Lather, RN Outcome: Progressing   Problem: Nutritional: Goal: Maintenance of adequate nutrition will improve 06/19/2022 0801 by Particia Lather, RN Outcome: Progressing 06/19/2022 0801 by Particia Lather, RN Outcome: Progressing Goal: Progress toward achieving an optimal weight will improve 06/19/2022 0801 by Particia Lather, RN Outcome: Progressing 06/19/2022 0801 by Particia Lather, RN Outcome: Progressing   Problem: Skin Integrity: Goal: Risk for impaired skin integrity will decrease 06/19/2022 0801 by Particia Lather, RN Outcome: Progressing 06/19/2022 0801 by Particia Lather, RN Outcome: Progressing   Problem: Tissue Perfusion: Goal: Adequacy of tissue perfusion will improve 06/19/2022 0801 by Particia Lather, RN Outcome: Progressing 06/19/2022 0801 by Particia Lather, RN Outcome: Progressing   Problem: Activity: Goal: Ability to tolerate increased activity will  improve 06/19/2022 0801 by Particia Lather, RN Outcome: Progressing 06/19/2022 0801 by Particia Lather, RN Outcome: Progressing   Problem: Respiratory: Goal: Ability to maintain a clear airway and adequate ventilation will improve 06/19/2022 0801 by Particia Lather, RN Outcome: Progressing 06/19/2022 0801 by Particia Lather, RN Outcome: Progressing   Problem: Role Relationship: Goal: Method of communication will improve 06/19/2022 0801 by Particia Lather, RN Outcome: Progressing 06/19/2022 0801 by Particia Lather, RN Outcome: Progressing   Problem: Education: Goal: Understanding of CV disease, CV risk reduction, and recovery process will improve 06/19/2022 0801 by Particia Lather, RN Outcome: Progressing 06/19/2022 0801 by Particia Lather, RN Outcome: Progressing Goal: Individualized Educational Video(s) 06/19/2022 0801 by Particia Lather, RN Outcome: Progressing 06/19/2022 0801 by Particia Lather, RN Outcome: Progressing   Problem: Activity: Goal: Ability to return to baseline activity level will improve 06/19/2022 0801 by Particia Lather, RN Outcome: Progressing 06/19/2022 0801 by Particia Lather, RN Outcome: Progressing   Problem: Cardiovascular: Goal: Ability to achieve and maintain adequate cardiovascular perfusion will improve 06/19/2022 0801 by Particia Lather, RN Outcome: Progressing 06/19/2022 0801 by Particia Lather, RN Outcome: Progressing Goal: Vascular access site(s)  Level 0-1 will be maintained 06/19/2022 0801 by Particia Lather, RN Outcome: Progressing 06/19/2022 0801 by Particia Lather, RN Outcome: Progressing   Problem: Health Behavior/Discharge Planning: Goal: Ability to safely manage health-related needs after discharge will improve 06/19/2022 0801 by Particia Lather, RN Outcome: Progressing 06/19/2022 0801 by Particia Lather, RN Outcome: Progressing   Problem: Education: Goal: Knowledge about  tracheostomy care/management will improve 06/19/2022 0801 by Particia Lather, RN Outcome: Progressing 06/19/2022 0801 by Particia Lather, RN Outcome: Progressing   Problem: Activity: Goal: Ability to tolerate increased activity will improve 06/19/2022 0801 by Particia Lather, RN Outcome: Progressing 06/19/2022 0801 by Particia Lather, RN Outcome: Progressing   Problem: Health Behavior/Discharge Planning: Goal: Ability to manage 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 06/19/2022 0801 by Particia Lather, RN Outcome: Progressing 06/19/2022 0801 by Particia Lather, RN Outcome: Progressing   Problem: Respiratory: Goal: Patent airway maintenance will improve 06/19/2022 0801 by Particia Lather, RN Outcome: Progressing 06/19/2022 0801 by Particia Lather, RN Outcome: Progressing   Problem: Education: Goal: Knowledge of General Education information will improve Description: Including pain rating scale, medication(s)/side effects and non-pharmacologic comfort measures 06/19/2022 0801 by Particia Lather, RN Outcome: Progressing 06/19/2022 0801 by Particia Lather, RN Outcome: Progressing   Problem: Health Behavior/Discharge Planning: Goal: Ability to manage health-related needs will improve 06/19/2022 0801 by Particia Lather, RN Outcome: Progressing 06/19/2022 0801 by Particia Lather, RN Outcome: Progressing   Problem: Clinical Measurements: Goal: Ability to maintain clinical measurements within normal limits will improve 06/19/2022 0801 by Particia Lather, RN Outcome: Progressing 06/19/2022 0801 by Particia Lather, RN Outcome: Progressing Goal: Will remain free from infection 06/19/2022 0801 by Particia Lather, RN Outcome: Progressing 06/19/2022 0801 by Particia Lather, RN Outcome: Progressing Goal: Diagnostic test results will improve 06/19/2022 0801 by Particia Lather, RN Outcome: Progressing 06/19/2022 0801 by Particia Lather, RN Outcome:  Progressing Goal: Respiratory complications will improve 06/19/2022 0801 by Particia Lather, RN Outcome: Progressing 06/19/2022 0801 by Particia Lather, RN Outcome: Progressing Goal: Cardiovascular complication will be avoided 06/19/2022 0801 by Particia Lather, RN Outcome: Progressing 06/19/2022 0801 by Particia Lather, RN Outcome: Progressing   Problem: Activity: Goal: Risk for activity intolerance will decrease 06/19/2022 0801 by Particia Lather, RN Outcome: Progressing 06/19/2022 0801 by Particia Lather, RN Outcome: Progressing   Problem: Nutrition: Goal: Adequate nutrition will be maintained 06/19/2022 0801 by Particia Lather, RN Outcome: Progressing 06/19/2022 0801 by Particia Lather, RN Outcome: Progressing   Problem: Coping: Goal: Level of anxiety will decrease 06/19/2022 0801 by Particia Lather, RN Outcome: Progressing 06/19/2022 0801 by Particia Lather, RN Outcome: Progressing   Problem: Elimination: Goal: Will not experience complications related to bowel motility 06/19/2022 0801 by Particia Lather, RN Outcome: Progressing 06/19/2022 0801 by Particia Lather, RN Outcome: Progressing Goal: Will not experience complications related to urinary retention 06/19/2022 0801 by Particia Lather, RN Outcome: Progressing 06/19/2022 0801 by Particia Lather, RN Outcome: Progressing   Problem: Pain Managment: Goal: General experience of comfort will improve 06/19/2022 0801 by Particia Lather, RN Outcome: Progressing 06/19/2022 0801 by Particia Lather, RN Outcome: Progressing   Problem: Safety: Goal: Ability to remain free from injury will improve 06/19/2022 0801 by Particia Lather, RN Outcome: Progressing 06/19/2022 0801 by Particia Lather, RN Outcome: Progressing   Problem: Skin Integrity: Goal: Risk for impaired skin integrity will decrease 06/19/2022 0801 by  Particia Lather, RN Outcome: Progressing 06/19/2022 0801 by  Particia Lather, RN Outcome: Progressing   Problem: Fluid Volume: Goal: Hemodynamic stability will improve 06/19/2022 0801 by Particia Lather, RN Outcome: Progressing 06/19/2022 0801 by Particia Lather, RN Outcome: Progressing   Problem: Clinical Measurements: Goal: Diagnostic test results will improve 06/19/2022 0801 by Particia Lather, RN Outcome: Progressing 06/19/2022 0801 by Particia Lather, RN Outcome: Progressing Goal: Signs and symptoms of infection will decrease 06/19/2022 0801 by Particia Lather, RN Outcome: Progressing 06/19/2022 0801 by Particia Lather, RN Outcome: Progressing   Problem: Respiratory: Goal: Ability to maintain adequate ventilation will improve 06/19/2022 0801 by Particia Lather, RN Outcome: Progressing 06/19/2022 0801 by Particia Lather, RN Outcome: Progressing   Problem: Education: Goal: Ability to describe self-care measures that may prevent or decrease complications (Diabetes Survival Skills Education) will improve 06/19/2022 0801 by Particia Lather, RN Outcome: Progressing 06/19/2022 0801 by Particia Lather, RN Outcome: Progressing Goal: Individualized Educational Video(s) 06/19/2022 0801 by Particia Lather, RN Outcome: Progressing 06/19/2022 0801 by Particia Lather, RN Outcome: Progressing   Problem: Coping: Goal: Ability to adjust to condition or change in health will improve 06/19/2022 0801 by Particia Lather, RN Outcome: Progressing 06/19/2022 0801 by Particia Lather, RN Outcome: Progressing   Problem: Fluid Volume: Goal: Ability to maintain a balanced intake and output will improve 06/19/2022 0801 by Particia Lather, RN Outcome: Progressing 06/19/2022 0801 by Particia Lather, RN Outcome: Progressing   Problem: Health Behavior/Discharge Planning: Goal: Ability to identify and utilize available resources and services will improve 06/19/2022 0801 by Particia Lather, RN Outcome:  Progressing 06/19/2022 0801 by Particia Lather, RN Outcome: Progressing Goal: Ability to manage health-related needs will improve 06/19/2022 0801 by Particia Lather, RN Outcome: Progressing 06/19/2022 0801 by Particia Lather, RN Outcome: Progressing   Problem: Metabolic: Goal: Ability to maintain appropriate glucose levels will improve 06/19/2022 0801 by Particia Lather, RN Outcome: Progressing 06/19/2022 0801 by Particia Lather, RN Outcome: Progressing   Problem: Nutritional: Goal: Maintenance of adequate nutrition will improve 06/19/2022 0801 by Particia Lather, RN Outcome: Progressing 06/19/2022 0801 by Particia Lather, RN Outcome: Progressing Goal: Progress toward achieving an optimal weight will improve 06/19/2022 0801 by Particia Lather, RN Outcome: Progressing 06/19/2022 0801 by Particia Lather, RN Outcome: Progressing   Problem: Skin Integrity: Goal: Risk for impaired skin integrity will decrease 06/19/2022 0801 by Particia Lather, RN Outcome: Progressing 06/19/2022 0801 by Particia Lather, RN Outcome: Progressing   Problem: Tissue Perfusion: Goal: Adequacy of tissue perfusion will improve 06/19/2022 0801 by Particia Lather, RN Outcome: Progressing 06/19/2022 0801 by Particia Lather, RN Outcome: Progressing   Problem: Activity: Goal: Ability to tolerate increased activity will improve 06/19/2022 0801 by Particia Lather, RN Outcome: Progressing 06/19/2022 0801 by Particia Lather, RN Outcome: Progressing   Problem: Respiratory: Goal: Ability to maintain a clear airway and adequate ventilation will improve 06/19/2022 0801 by Particia Lather, RN Outcome: Progressing 06/19/2022 0801 by Particia Lather, RN Outcome: Progressing   Problem: Role Relationship: Goal: Method of communication will improve 06/19/2022 0801 by Particia Lather, RN Outcome: Progressing 06/19/2022 0801 by Particia Lather, RN Outcome:  Progressing   Problem: Education: Goal: Understanding of CV disease, CV risk reduction, and recovery process will improve 06/19/2022 0801 by Particia Lather, RN Outcome: Progressing 06/19/2022 0801 by Particia Lather, RN Outcome: Progressing Goal:  Individualized Educational Video(s) 06/19/2022 0801 by Particia Lather, RN Outcome: Progressing 06/19/2022 0801 by Particia Lather, RN Outcome: Progressing   Problem: Activity: Goal: Ability to return to baseline activity level will improve 06/19/2022 0801 by Particia Lather, RN Outcome: Progressing 06/19/2022 0801 by Particia Lather, RN Outcome: Progressing   Problem: Cardiovascular: Goal: Ability to achieve and maintain adequate cardiovascular perfusion will improve 06/19/2022 0801 by Particia Lather, RN Outcome: Progressing 06/19/2022 0801 by Particia Lather, RN Outcome: Progressing Goal: Vascular access site(s) Level 0-1 will be maintained 06/19/2022 0801 by Particia Lather, RN Outcome: Progressing 06/19/2022 0801 by Particia Lather, RN Outcome: Progressing   Problem: Health Behavior/Discharge Planning: Goal: Ability to safely manage health-related needs after discharge will improve 06/19/2022 0801 by Particia Lather, RN Outcome: Progressing 06/19/2022 0801 by Particia Lather, RN Outcome: Progressing   Problem: Education: Goal: 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 06/19/2022 0801 by Particia Lather, RN Outcome: Progressing 06/19/2022 0801 by Particia Lather, RN Outcome: Progressing   Problem: Activity: Goal: Ability to tolerate increased activity will improve 06/19/2022 0801 by Particia Lather, RN Outcome: Progressing 06/19/2022 0801 by Particia Lather, RN Outcome: Progressing   Problem: Health Behavior/Discharge Planning: Goal: Ability to manage 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 06/19/2022 0801 by Particia Lather, RN Outcome: Progressing 06/19/2022 0801 by Particia Lather, RN Outcome: Progressing   Problem: Respiratory: Goal: Patent airway maintenance will improve 06/19/2022 0801 by Particia Lather, RN Outcome: Progressing 06/19/2022 0801 by Particia Lather, RN Outcome: Progressing

## 2022-06-19 NOTE — Discharge Instructions (Signed)
 VENTRAL HERNIA REPAIR POST OPERATIVE INSTRUCTIONS  Thinking Clearly  The anesthesia may cause you to feel different for 1 or 2 days. Do not drive, drink alcohol, or make any big decisions for at least 2 days.  Nutrition When you wake up, you will be able to drink small amounts of liquid. If you do not feel sick, you can slowly advance your diet to regular foods. Continue to drink lots of fluids, usually about 8 to 10 glasses per day. Eat a high-fiber diet so you don't strain during bowel movements. High-Fiber Foods Foods high in fiber include beans, bran cereals and whole-grain breads, peas, dried fruit (figs, apricots, and dates), raspberries, blackberries, strawberries, sweet corn, broccoli, baked potatoes with skin, plums, pears, apples, greens, and nuts. Activity Slowly increase your activity. Be sure to get up and walk every hour or so to prevent blood clots. No heavy lifting or strenuous activity for 4 weeks following surgery to prevent hernias at your incision sites or recurrence of your hernia. It is normal to feel tired. You may need more sleep than usual.  Get your rest but make sure to get up and move around frequently to prevent blood clots and pneumonia.  Work and Return to School You can go back to work when you feel well enough. Discuss the timing with your surgeon. You can usually go back to school or work 1 week or less after an laparoscopic or an open repair. If your work requires heavy lifting or strenuous activity you need to be placed on light duty for 4 weeks following surgery. You can return to gym class, sports or other physical activities 4 weeks after surgery.  Wound Care You may experience significant bruising throughout the abdominal wall that may track down into the groin including into the scrotum in males.  Rest, elevating the groin and scrotum above the level of the heart, ice and compression with tight fitting underwear or an abdominal binder can help.   Always wash your hands before and after touching near your incision site. Do not soak in a bathtub until cleared at your follow up appointment. You may take a shower 24 hours after surgery. A small amount of drainage from the incision is normal. If the drainage is thick and yellow or the site is red, you may have an infection, so call your surgeon. If you have a drain in one of your incisions, it will be taken out in office when the drainage stops. Steri-Strips will fall off in 7 to 10 days or they will be removed during your first office visit. If you have dermabond glue covering over the incision, allow the glue to flake off on its own. Protect the new skin, especially from the sun. The sun can burn and cause darker scarring. Your scar will heal in about 4 to 6 weeks and will become softer and continue to fade over the next year.  The cosmetic appearance of the incisions will improve over the course of the first year after surgery. Sensation around your incision will return in a few weeks or months.  Bowel Movements After intestinal surgery, you may have loose watery stools for several days. If watery diarrhea lasts longer than 3 days, contact your surgeon. Pain medication (narcotics) can cause constipation. Increase the fiber in your diet with high-fiber foods if you are constipated. You can take an over the counter stool softener like Colace to avoid constipation.  Additional over the counter medications can also be used   if Colace isn't sufficient (for example, Milk of Magnesia or Miralax).  Pain The amount of pain is different for each person. Some people need only 1 to 3 doses of pain control medication, while others need more. Take alternating doses of tylenol and ibuprofen around the clock for the first five days following surgery.  This will provide a baseline of pain control and help with inflammation.  Take the narcotic pain medication in addition if needed for severe pain.  Contact  Your Surgeon at [phone], if you have: Pain that will not go away Pain that gets worse A fever of more than 101F (38.3C) Repeated vomiting Swelling, redness, bleeding, or bad-smelling drainage from your wound site Strong abdominal pain No bowel movement or unable to pass gas for 3 days Watery diarrhea lasting longer than 3 days  Pain Control The goal of pain control is to minimize pain, keep you moving and help you heal. Your surgical team will work with you on your pain plan. Most often a combination of therapies and medications are used to control your pain. You may also be given medication (local anesthetic) at the surgical site. This may help control your pain for several days. Extreme pain puts extra stress on your body at a time when your body needs to focus on healing. Do not wait until your pain has reached a level "10" or is unbearable before telling your doctor or nurse. It is much easier to control pain before it becomes severe. Following a laparoscopic procedure, pain is sometimes felt in the shoulder. This is due to the gas inserted into your abdomen during the procedure. Moving and walking helps to decrease the gas and the right shoulder pain.  Use the guide below for ways to manage your post-operative pain. Learn more by going to facs.org/safepaincontrol.  How Intense Is My Pain Common Therapies to Feel Better       I hardly notice my pain, and it does not interfere with my activities.  I notice my pain and it distracts me, but I can still do activities (sitting up, walking, standing).  Non-Medication Therapies  Ice (in a bag, applied over clothing at the surgical site), elevation, rest, meditation, massage, distraction (music, TV, play) walking and mild exercise Splinting the abdomen with pillows +  Non-Opioid Medications Acetaminophen (Tylenol) Non-steroidal anti-inflammatory drugs (NSAIDS) Aspirin, Ibuprofen (Motrin, Advil) Naproxen (Aleve) Take these as  needed, when you feel pain. Both acetaminophen and NSAIDs help to decrease pain and swelling (inflammation).      My pain is hard to ignore and is more noticeable even when I rest.  My pain interferes with my usual activities.  Non-Medication Therapies  +  Non-Opioid medications  Take on a regular schedule (around-the-clock) instead of as needed. (For example, Tylenol every 6 hours at 9:00 am, 3:00 pm, 9:00 pm, 3:00 am and Motrin every 6 hours at 12:00 am, 6:00 am, 12:00 pm, 6:00 pm)         I am focused on my pain, and I am not doing my daily activities.  I am groaning in pain, and I cannot sleep. I am unable to do anything.  My pain is as bad as it could be, and nothing else matters.  Non-Medication Therapies  +  Around-the-Clock Non-Opioid Medications  +  Short-acting opioids  Opioids should be used with other medications to manage severe pain. Opioids block pain and give a feeling of euphoria (feel high). Addiction, a serious side effect of opioids, is   rare with short-term (a few days) use.  Examples of short-acting opioids include: Tramadol (Ultram), Hydrocodone (Norco, Vicodin), Hydromorphone (Dilaudid), Oxycodone (Oxycontin)     The above directions have been adapted from the American [employer] Surgical Patient Education Program.  Please refer to the ACS website if needed: [email or web address]/-/media/[email or web address]   Patricia Gendron, MD [facility] [address], [address], [place], [place]  [address] ?  [address] [address], [place], [place]   [address] [phone] ? [phone] ? FAX [phone] Web site: [email or web address]  

## 2022-06-19 NOTE — Progress Notes (Signed)
49 Days Post-Op   Subjective/Chief Complaint: No major complaints or issues this morning.  Objective: Vital signs in last 24 hours: Temp:  [97.5 F (36.4 C)-98.5 F (36.9 C)] 97.5 F (36.4 C) (11/22 0545) Pulse Rate:  [81-97] 87 (11/22 0545) Resp:  [15-18] 18 (11/22 0545) BP: (110-132)/(76-94) 114/94 (11/22 0545) SpO2:  [99 %-100 %] 99 % (11/22 0545) Last BM Date : 06/14/22  Intake/Output from previous day: No intake/output data recorded. Intake/Output this shift: No intake/output data recorded.  Alert, NAD, resting comfortably Normal work of breathing Clean dressing in place over trach site. Abd - abdomen soft, nontender, nondistended. Incisions well-healed.  Lab Results:  No results for input(s): "WBC", "HGB", "HCT", "PLT" in the last 72 hours. BMET Recent Labs    06/18/22 0305  NA 138  K 3.9  CL 104  CO2 20*  GLUCOSE 116*  BUN 16  CREATININE 1.26*  CALCIUM 9.6    PT/INR No results for input(s): "LABPROT", "INR" in the last 72 hours. ABG No results for input(s): "PHART", "HCO3" in the last 72 hours.  Invalid input(s): "PCO2", "PO2"  Studies/Results: No results found.  Anti-infectives: Anti-infectives (From admission, onward)    Start     Dose/Rate Route Frequency Ordered Stop   06/04/22 2100  ceFAZolin (ANCEF) IVPB 2g/100 mL premix        2 g 200 mL/hr over 30 Minutes Intravenous Every 8 hours 06/04/22 1103 06/05/22 2037   06/01/22 1515  ceFEPIme (MAXIPIME) 2 g in sodium chloride 0.9 % 100 mL IVPB  Status:  Discontinued        2 g 200 mL/hr over 30 Minutes Intravenous Every 12 hours 06/01/22 1420 06/04/22 1103   05/15/22 1445  ceFAZolin (ANCEF) IVPB 2g/100 mL premix       Note to Pharmacy: To be given in IR   2 g 200 mL/hr over 30 Minutes Intravenous  Once 05/15/22 1352 05/15/22 1616   05/15/22 1230  ceFAZolin (ANCEF) powder 1 g  Status:  Discontinued        1 g Other To Surgery 05/15/22 1140 05/15/22 1239   05/15/22 1230  ceFAZolin (ANCEF) IVPB  2g/100 mL premix        2 g 200 mL/hr over 30 Minutes Intravenous To Radiology 05/15/22 1140 05/15/22 1140   05/15/22 0000  ceFAZolin (ANCEF) IVPB 2g/100 mL premix        2 g 200 mL/hr over 30 Minutes Intravenous To Radiology 05/14/22 1308 05/15/22 0005   05/13/22 2134  meropenem (MERREM) 500 mg in sodium chloride 0.9 % 100 mL IVPB        500 mg 200 mL/hr over 30 Minutes Intravenous Every 24 hours 05/13/22 0815 05/17/22 0700   05/13/22 1000  meropenem (MERREM) 500 mg in sodium chloride 0.9 % 100 mL IVPB  Status:  Discontinued        500 mg 200 mL/hr over 30 Minutes Intravenous Every 12 hours 05/12/22 0940 05/12/22 0946   05/12/22 2200  meropenem (MERREM) 500 mg in sodium chloride 0.9 % 100 mL IVPB  Status:  Discontinued        500 mg 200 mL/hr over 30 Minutes Intravenous Every 12 hours 05/12/22 0946 05/13/22 0815   05/12/22 2000  meropenem (MERREM) 1 g in sodium chloride 0.9 % 100 mL IVPB  Status:  Discontinued        1 g 200 mL/hr over 30 Minutes Intravenous  Once 05/12/22 0940 05/12/22 0946   05/06/22 1530  meropenem (MERREM)  1 g in sodium chloride 0.9 % 100 mL IVPB  Status:  Discontinued        1 g 200 mL/hr over 30 Minutes Intravenous Every 8 hours 05/06/22 1438 05/12/22 0940   05/04/22 1000  vancomycin (VANCOCIN) IVPB 1000 mg/200 mL premix  Status:  Discontinued        1,000 mg 200 mL/hr over 60 Minutes Intravenous Every 24 hours 05/03/22 0802 05/08/22 0913   05/03/22 0845  vancomycin (VANCOREADY) IVPB 2000 mg/400 mL        2,000 mg 200 mL/hr over 120 Minutes Intravenous  Once 05/03/22 0751 05/03/22 1114   05/01/22 1800  piperacillin-tazobactam (ZOSYN) IVPB 3.375 g  Status:  Discontinued        3.375 g 12.5 mL/hr over 240 Minutes Intravenous Every 6 hours 05/01/22 1426 05/01/22 1427   05/01/22 1515  piperacillin-tazobactam (ZOSYN) IVPB 3.375 g  Status:  Discontinued        3.375 g 100 mL/hr over 30 Minutes Intravenous Every 6 hours 05/01/22 1428 05/06/22 1438   04/30/22 1400   piperacillin-tazobactam (ZOSYN) IVPB 2.25 g  Status:  Discontinued        2.25 g 100 mL/hr over 30 Minutes Intravenous Every 8 hours 04/30/22 1325 05/01/22 1426   04/29/22 1530  piperacillin-tazobactam (ZOSYN) IVPB 3.375 g  Status:  Discontinued        3.375 g 12.5 mL/hr over 240 Minutes Intravenous Every 8 hours 04/29/22 1433 04/30/22 1325   04/29/22 1145  metroNIDAZOLE (FLAGYL) IVPB 500 mg  Status:  Discontinued        500 mg 100 mL/hr over 60 Minutes Intravenous Every 12 hours 04/29/22 1047 04/29/22 1433   04/29/22 1145  vancomycin (VANCOCIN) IVPB 1000 mg/200 mL premix  Status:  Discontinued        1,000 mg 200 mL/hr over 60 Minutes Intravenous  Once 04/29/22 1047 04/29/22 1055   04/29/22 1145  vancomycin (VANCOREADY) IVPB 1500 mg/300 mL  Status:  Discontinued        1,500 mg 150 mL/hr over 120 Minutes Intravenous Every 24 hours 04/29/22 1055 04/29/22 1357   04/29/22 1045  ceFEPIme (MAXIPIME) 2 g in sodium chloride 0.9 % 100 mL IVPB  Status:  Discontinued        2 g 200 mL/hr over 30 Minutes Intravenous Every 8 hours 04/29/22 0954 04/29/22 1433   04/23/22 0730  ceFAZolin (ANCEF) IVPB 2g/100 mL premix        2 g 200 mL/hr over 30 Minutes Intravenous On call to O.R. 04/23/22 4132 04/23/22 0828       Assessment/Plan: s/p Procedure(s): Coronary/Graft Acute MI Revascularization (N/A) LEFT HEART CATH AND CORONARY ANGIOGRAPHY (N/A) RIGHT/LEFT HEART CATH AND CORONARY ANGIOGRAPHY (N/A) Advance diet Ms. Franko is s/p robotic incisional hernia repair on 04/23/22 Plan for going home with home health Discussed patient's care with her sister who will be primary care giver - sister feels comfortable with patient coming home Continue D3 diet, working with SLP. Continue PT/OT - deconditioned and needs significant rehabilitation Unna boot right foot for heel pressure ulcer Out of bed to chair as much as possible  Ready for discharge home once equipment ready    Acute metabolic  encephalopathy superimposed on hx bipolar disorder, schizophrenia   Acute biventricular HFrEF (Etiology sepsis, stress induced CM. Has clean coronaries) Aspiration PNA Pulm edema   RLL VAP  Anemia, acute on chronic  DM2 with hyperglycemia  Severe protein calorie malnutrition  Deconditioning     LOS: 56  days    Patricia [name] 06/19/2022

## 2022-06-19 NOTE — Progress Notes (Signed)
Occupational Therapy Treatment Patient Details Name: ALEESIA HENNEY MRN: [id number] DOB: 07/13/1971 Today's Date: 06/19/2022   History of present illness 51 yo female admitted 04/23/22 for excisional hernia repair with mesh. Course complicated by post-op ileus, tachycardia, PNA, sepsis. Transfer to ICU 10/2 on levophed. Worsening acidosis 10/3 requiring intubation. Cardiac cath 10/4 with EF 15%. Pt with AKI; on CRRT 10/5-10/14. Failed extubation 10/8 and 10/14; s/p trach 10/14. PMH includes depression, OSA, asthma, schizophrenia, DM, insomnia.   OT comments  Pt making good progress with all adls but continues to require, at times, +2 assist. Pt and chart speak of going home. Feel pt and family need in person training for transfers. Pt currently uses a steady to assist with transfers that will not be available at home. Educated pt on removing side rail of w/c and how to do a lateral scoot transfer into the chair. Family needs to practice this as well as transfers for [facility]. Pt may benefit from drop arm BSC at d/c if she is unable to pivot with walker.  Will focus therapy on family training moving forward to ensure safe transfers from bed to w/c and to [facility].    Recommendations for follow up therapy are one component of a multi-disciplinary discharge planning process, led by the attending physician.  Recommendations may be updated based on patient status, additional functional criteria and insurance authorization.    Follow Up Recommendations  Skilled nursing-short term rehab (<3 hours/day)     Assistance Recommended at Discharge Frequent or constant Supervision/Assistance  Patient can return home with the following  Two people to help with walking and/or transfers;A lot of help with bathing/dressing/bathroom;Assistance with cooking/housework;Help with stairs or ramp for entrance;Assist for transportation   Equipment Recommendations  Other (comment) (tbd)    Recommendations for Other Services       Precautions / Restrictions Precautions Precautions: Fall Restrictions Weight Bearing Restrictions: No       Mobility Bed Mobility Overal bed mobility: Needs Assistance Bed Mobility: Supine to Sit     Supine to sit: Mod assist     General bed mobility comments: Pt able to get to EOB pulling on bedrail and using therapist one time to pull to EOB and staighten hips.    Transfers Overall transfer level: Needs assistance Equipment used: Rolling walker (2 wheels) Transfers: Sit to/from Stand Sit to Stand: Mod assist, From elevated surface   Squat pivot transfers: Mod assist      Lateral/Scoot Transfers: Min assist General transfer comment: Pt stood from elevated surface with mod assist. Pt requires steady if only +1 assist available due to weakness in legs. Transfer via Lift Equipment: Stedy   Balance Overall balance assessment: Needs assistance Sitting-balance support: No upper extremity supported, Feet supported Sitting balance-Leahy Scale: Good     Standing balance support: Bilateral upper extremity supported, During functional activity Standing balance-Leahy Scale: Poor Standing balance comment: reliant on Stedy for support to stand and maintain balance                           ADL either performed or assessed with clinical judgement   ADL Overall ADL's : Needs assistance/impaired Eating/Feeding: Set up;Sitting   Grooming: Wash/dry hands;Wash/dry face;Oral care;Set up;Sitting   Upper Body Bathing: Set up;Sitting   Lower Body Bathing: Moderate assistance;Sit to/from stand;Cueing for compensatory techniques Lower Body Bathing Details (indicate cue type and reason): Pt can bath all of her legs and now can sit in figure  4 position by pulling legs up to bathe to feet. Pt unable to bathe bottom unless on EOB leaning side to side. Pt did stand at bedside today with walker but unable to let go of walker to be able to bathe bottom without assist. Upper  Body Dressing : Set up;Sitting   Lower Body Dressing: Moderate assistance;Sit to/from stand;Cueing for compensatory techniques Lower Body Dressing Details (indicate cue type and reason): Pt can start underwear and pants over legs and donn socks. Mod assist needed when standing to pull up pants.  Bed level raised and pt stood at walker. Toilet Transfer: Moderate assistance;+2 for physical assistance;Squat-pivot;BSC/3in1 Toilet Transfer Details (indicate cue type and reason): Pt can squat pivot and reach to far arm rail with mod assist x2. pt able to stand at walker with mod assist x1 but knees buckle when weight shifting. Toileting- Clothing Manipulation and Hygiene: Moderate assistance;+2 for physical assistance;Sit to/from stand       Functional mobility during ADLs: Moderate assistance;+2 for physical assistance General ADL Comments: Pt did most self care from recliner. pt with increased independence standing from bed due to bed height being raised.    Extremity/Trunk Assessment Upper Extremity Assessment Upper Extremity Assessment: Overall WFL for tasks assessed            Vision   Vision Assessment?: No apparent visual deficits   Perception Perception Perception: Within Functional Limits   Praxis Praxis Praxis: Intact    Cognition Arousal/Alertness: Awake/alert Behavior During Therapy: WFL for tasks assessed/performed Overall Cognitive Status: Within Functional Limits for tasks assessed                                 General Comments: alert and oriented        Exercises      Shoulder Instructions       General Comments Pt most limited by LE weakness when it comes to mobility.    Pertinent Vitals/ Pain       Pain Assessment Pain Assessment: No/denies pain  Home Living                                          Prior Functioning/Environment              Frequency  Min 2X/week        Progress Toward Goals  OT  Goals(current goals can now be found in the care plan section)  Progress towards OT goals: Progressing toward goals  Acute Rehab OT Goals Patient Stated Goal: to go home OT Goal Formulation: With patient Time For Goal Achievement: 06/24/22 Potential to Achieve Goals: Good ADL Goals Pt Will Perform Grooming: with set-up;with supervision;sitting Pt Will Perform Upper Body Dressing: with set-up;with supervision;sitting Pt Will Perform Lower Body Dressing: with mod assist;with adaptive equipment;sit to/from stand Pt Will Transfer to Toilet: with min assist;with +2 assist;squat pivot transfer;bedside commode Additional ADL Goal #1: Pt will be min guard A in and OOB for basic ADLs  Plan Discharge plan remains appropriate    Co-evaluation                 AM-PAC OT "6 Clicks" Daily Activity     Outcome Measure   Help from another person eating meals?: None Help from another person taking care of personal grooming?: None Help from another person  toileting, which includes using toliet, bedpan, or urinal?: A Lot Help from another person bathing (including washing, rinsing, drying)?: A Lot Help from another person to put on and taking off regular upper body clothing?: A Little Help from another person to put on and taking off regular lower body clothing?: A Lot 6 Click Score: 17    End of Session Equipment Utilized During Treatment: Gait belt;Rolling walker (2 wheels)  OT Visit Diagnosis: Other abnormalities of gait and mobility (R26.89);Muscle weakness (generalized) (M62.81)   Activity Tolerance Patient tolerated treatment well   Patient Left in chair;with call bell/phone within reach;with chair alarm set   Nurse Communication Mobility status;Need for lift equipment        Time: 1128-1200 OT Time Calculation (min): 32 min  Charges: OT General Charges $OT Visit: 1 Visit OT Treatments $Self Care/Home Management : 23-37 mins    Glenford Peers 06/19/2022, 12:18 PM

## 2022-06-20 LAB — RENAL FUNCTION PANEL
Albumin: 2.7 g/dL — ABNORMAL LOW (ref 3.5–5.0)
Anion gap: 12 (ref 5–15)
BUN: 13 mg/dL (ref 6–20)
CO2: 25 mmol/L (ref 22–32)
Calcium: 10.3 mg/dL (ref 8.9–10.3)
Chloride: 98 mmol/L (ref 98–111)
Creatinine, Ser: 1.12 mg/dL — ABNORMAL HIGH (ref 0.44–1.00)
GFR, Estimated: 59 mL/min — ABNORMAL LOW (ref >60)
Glucose, Bld: 106 mg/dL — ABNORMAL HIGH (ref 70–99)
Phosphorus: 5.6 mg/dL — ABNORMAL HIGH (ref 2.5–4.6)
Potassium: 3.9 mmol/L (ref 3.5–5.1)
Sodium: 135 mmol/L (ref 135–145)

## 2022-06-20 LAB — GLUCOSE, CAPILLARY
Glucose-Capillary: 102 mg/dL — ABNORMAL HIGH (ref 70–99)
Glucose-Capillary: 105 mg/dL — ABNORMAL HIGH (ref 70–99)
Glucose-Capillary: 111 mg/dL — ABNORMAL HIGH (ref 70–99)
Glucose-Capillary: 130 mg/dL — ABNORMAL HIGH (ref 70–99)
Glucose-Capillary: 148 mg/dL — ABNORMAL HIGH (ref 70–99)

## 2022-06-20 NOTE — Progress Notes (Signed)
Speech Language Pathology Treatment: Dysphagia  Patient Details Name: Patricia [name] MRN: [id number] DOB: 03/22/1971 Today's Date: 06/20/2022 Time: 1418-1430 SLP Time Calculation (min) (ACUTE ONLY): 12 min  Assessment / Plan / Recommendation Clinical Impression  Patricia [name]' diet was advanced to regular solids on 11/21. She has tolerated well (aside from Patricia stomach bothering Patricia after eating greasy foods).  Patricia [name] was at bedside today, having brought Patricia a Thanksgiving meal from home. She demonstrates thorough mastication, no oral residue post-swallow, no multiple sub-swallows. There are no s/s of aspiration when eating/drinking. Swallow function appears to have normalized.   Reviewed with Patricia [name] Patricia progress with regard to speech and swallowing - she is very pleased.  Goals have been met. No further SLP f/u needed while here or after D/C.  Our service will sign off.   HPI HPI: Patricia [name] is a 51 y.o. female who presented to hospital for excisional hernia repair (robotic hernia repair 04/23/22) with mesh, ileus post op on TNA. Developed vomiting and hypotension after NG came out on 04/29/22 and PNA on CXR, was tachycardic and BP improved with IV fluids. DX with sepsis and ABX started. Pt admitted to ICU on 10/2.  Was placed on levophed. Early AM on 10/3 was intubated for worsening metabolic/lactic acidosis. 10/4 cardiac cath with EF 15%; She had AKI as well. Started CRRT. Extubation 10/14 failed; underwent tracheostomy. TC trials started 10/16. Cleared by surgery to attempt POs 10/27. MBS 11/3 with severe dysphagia and silent aspiration of all consistencies. 11/14 trach down-sized to 4 cuffless. MBS 11/15- significant improvements, started dysphagia 3/thin liquids. Dacannulated same date.      SLP Plan  All goals met      Recommendations for follow up therapy are one component of a multi-disciplinary discharge planning process, led by the attending physician.  Recommendations  may be updated based on patient status, additional functional criteria and insurance authorization.    Recommendations  Diet recommendations: Regular;Thin liquid Liquids provided via: Cup;No straw Medication Administration: Whole meds with liquid Supervision: Patient able to self feed                Oral Care Recommendations: Oral care BID Follow Up Recommendations: No SLP follow up Assistance recommended at discharge: None SLP Visit Diagnosis: Dysphagia, pharyngeal phase (R13.13) Plan: All goals met           L. , MA CCC/SLP Clinical Specialist - Acute Care SLP Acute Rehabilitation Services Office number [phone]  ,  Laurice  06/20/2022, 2:33 PM 

## 2022-06-20 NOTE — Progress Notes (Signed)
Patient ID: Patricia [name], female   DOB: February 11, 1971, 51 y.o.   MRN: [id number] [facility] Progress Note:   50 Days Post-Op   THE PLAN She can go home with home health as soon as PT/OT/Case management say it's okay. Her sisters will care for her - they are very nice. She has a home health nurse who will be increasing hours. I signed for 5 different pieces of medical equipment this week. Possible she could go this weekend, but wouldn't be surprised if logistics keep her around till Monday. --PS Today is Thanksgiving so discharge is not happening today.  Maybe tomorrow.    Subjective: Mental status is baseline.  Complaints none. Objective: Vital signs in last 24 hours: Temp:  [98 F (36.7 C)-98.6 F (37 C)] 98.3 F (36.8 C) (11/23 0507) Pulse Rate:  [75-88] 75 (11/23 0507) Resp:  [15-19] 16 (11/23 0507) BP: (120-128)/(85-96) 120/85 (11/23 0507) SpO2:  [98 %-100 %] 100 % (11/23 0507) Weight:  [173.6 kg] 173.6 kg (11/23 0500)  Intake/Output from previous day: 11/22 0701 - 11/23 0700 In: -  Out: 700 [Urine:700] Intake/Output this shift: No intake/output data recorded.  Physical Exam: Work of breathing is not labored  Lab Results:  Results for orders placed or performed during the hospital encounter of 04/23/22 (from the past 48 hour(s))  Glucose, capillary     Status: Abnormal   Collection Time: 06/18/22  1:15 PM  Result Value Ref Range   Glucose-Capillary 134 (H) 70 - 99 mg/dL    Comment: Glucose reference range applies only to samples taken after fasting for at least 8 hours.  Glucose, capillary     Status: Abnormal   Collection Time: 06/18/22  5:45 PM  Result Value Ref Range   Glucose-Capillary 106 (H) 70 - 99 mg/dL    Comment: Glucose reference range applies only to samples taken after fasting for at least 8 hours.  Glucose, capillary     Status: None   Collection Time: 06/19/22 12:24 AM  Result Value Ref Range   Glucose-Capillary 98 70 - 99 mg/dL     Comment: Glucose reference range applies only to samples taken after fasting for at least 8 hours.  Glucose, capillary     Status: None   Collection Time: 06/19/22  5:42 AM  Result Value Ref Range   Glucose-Capillary 97 70 - 99 mg/dL    Comment: Glucose reference range applies only to samples taken after fasting for at least 8 hours.  Glucose, capillary     Status: Abnormal   Collection Time: 06/19/22 11:31 AM  Result Value Ref Range   Glucose-Capillary 112 (H) 70 - 99 mg/dL    Comment: Glucose reference range applies only to samples taken after fasting for at least 8 hours.  Glucose, capillary     Status: Abnormal   Collection Time: 06/19/22  6:09 PM  Result Value Ref Range   Glucose-Capillary 117 (H) 70 - 99 mg/dL    Comment: Glucose reference range applies only to samples taken after fasting for at least 8 hours.  Glucose, capillary     Status: Abnormal   Collection Time: 06/20/22 12:25 AM  Result Value Ref Range   Glucose-Capillary 102 (H) 70 - 99 mg/dL    Comment: Glucose reference range applies only to samples taken after fasting for at least 8 hours.  Renal function panel     Status: Abnormal   Collection Time: 06/20/22  3:50 AM  Result Value Ref Range  Sodium 135 135 - 145 mmol/L   Potassium 3.9 3.5 - 5.1 mmol/L   Chloride 98 98 - 111 mmol/L   CO2 25 22 - 32 mmol/L   Glucose, Bld 106 (H) 70 - 99 mg/dL    Comment: Glucose reference range applies only to samples taken after fasting for at least 8 hours.   BUN 13 6 - 20 mg/dL   Creatinine, Ser 1.12 (H) 0.44 - 1.00 mg/dL   Calcium 10.3 8.9 - 10.3 mg/dL   Phosphorus 5.6 (H) 2.5 - 4.6 mg/dL   Albumin 2.7 (L) 3.5 - 5.0 g/dL   GFR, Estimated 59 (L) >60 mL/min    Comment: (NOTE) Calculated using the CKD-EPI Creatinine Equation (2021)    Anion gap 12 5 - 15    Comment: Performed at [facility] [address], [place], [place] [address]  Glucose, capillary     Status: Abnormal   Collection Time: 06/20/22  5:55 AM   Result Value Ref Range   Glucose-Capillary 111 (H) 70 - 99 mg/dL    Comment: Glucose reference range applies only to samples taken after fasting for at least 8 hours.    Radiology/Results: No results found.  Anti-infectives: Anti-infectives (From admission, onward)    Start     Dose/Rate Route Frequency Ordered Stop   06/04/22 2100  ceFAZolin (ANCEF) IVPB 2g/100 mL premix        2 g 200 mL/hr over 30 Minutes Intravenous Every 8 hours 06/04/22 1103 06/05/22 2037   06/01/22 1515  ceFEPIme (MAXIPIME) 2 g in sodium chloride 0.9 % 100 mL IVPB  Status:  Discontinued        2 g 200 mL/hr over 30 Minutes Intravenous Every 12 hours 06/01/22 1420 06/04/22 1103   05/15/22 1445  ceFAZolin (ANCEF) IVPB 2g/100 mL premix       Note to Pharmacy: To be given in IR   2 g 200 mL/hr over 30 Minutes Intravenous  Once 05/15/22 1352 05/15/22 1616   05/15/22 1230  ceFAZolin (ANCEF) powder 1 g  Status:  Discontinued        1 g Other To Surgery 05/15/22 1140 05/15/22 1239   05/15/22 1230  ceFAZolin (ANCEF) IVPB 2g/100 mL premix        2 g 200 mL/hr over 30 Minutes Intravenous To Radiology 05/15/22 1140 05/15/22 1140   05/15/22 0000  ceFAZolin (ANCEF) IVPB 2g/100 mL premix        2 g 200 mL/hr over 30 Minutes Intravenous To Radiology 05/14/22 1308 05/15/22 0005   05/13/22 2134  meropenem (MERREM) 500 mg in sodium chloride 0.9 % 100 mL IVPB        500 mg 200 mL/hr over 30 Minutes Intravenous Every 24 hours 05/13/22 0815 05/17/22 0700   05/13/22 1000  meropenem (MERREM) 500 mg in sodium chloride 0.9 % 100 mL IVPB  Status:  Discontinued        500 mg 200 mL/hr over 30 Minutes Intravenous Every 12 hours 05/12/22 0940 05/12/22 0946   05/12/22 2200  meropenem (MERREM) 500 mg in sodium chloride 0.9 % 100 mL IVPB  Status:  Discontinued        500 mg 200 mL/hr over 30 Minutes Intravenous Every 12 hours 05/12/22 0946 05/13/22 0815   05/12/22 2000  meropenem (MERREM) 1 g in sodium chloride 0.9 % 100 mL IVPB   Status:  Discontinued        1 g 200 mL/hr over 30 Minutes Intravenous  Once 05/12/22 0940 05/12/22 0946   05/06/22 1530  meropenem (MERREM) 1 g in sodium chloride 0.9 % 100 mL IVPB  Status:  Discontinued        1 g 200 mL/hr over 30 Minutes Intravenous Every 8 hours 05/06/22 1438 05/12/22 0940   05/04/22 1000  vancomycin (VANCOCIN) IVPB 1000 mg/200 mL premix  Status:  Discontinued        1,000 mg 200 mL/hr over 60 Minutes Intravenous Every 24 hours 05/03/22 0802 05/08/22 0913   05/03/22 0845  vancomycin (VANCOREADY) IVPB 2000 mg/400 mL        2,000 mg 200 mL/hr over 120 Minutes Intravenous  Once 05/03/22 0751 05/03/22 1114   05/01/22 1800  piperacillin-tazobactam (ZOSYN) IVPB 3.375 g  Status:  Discontinued        3.375 g 12.5 mL/hr over 240 Minutes Intravenous Every 6 hours 05/01/22 1426 05/01/22 1427   05/01/22 1515  piperacillin-tazobactam (ZOSYN) IVPB 3.375 g  Status:  Discontinued        3.375 g 100 mL/hr over 30 Minutes Intravenous Every 6 hours 05/01/22 1428 05/06/22 1438   04/30/22 1400  piperacillin-tazobactam (ZOSYN) IVPB 2.25 g  Status:  Discontinued        2.25 g 100 mL/hr over 30 Minutes Intravenous Every 8 hours 04/30/22 1325 05/01/22 1426   04/29/22 1530  piperacillin-tazobactam (ZOSYN) IVPB 3.375 g  Status:  Discontinued        3.375 g 12.5 mL/hr over 240 Minutes Intravenous Every 8 hours 04/29/22 1433 04/30/22 1325   04/29/22 1145  metroNIDAZOLE (FLAGYL) IVPB 500 mg  Status:  Discontinued        500 mg 100 mL/hr over 60 Minutes Intravenous Every 12 hours 04/29/22 1047 04/29/22 1433   04/29/22 1145  vancomycin (VANCOCIN) IVPB 1000 mg/200 mL premix  Status:  Discontinued        1,000 mg 200 mL/hr over 60 Minutes Intravenous  Once 04/29/22 1047 04/29/22 1055   04/29/22 1145  vancomycin (VANCOREADY) IVPB 1500 mg/300 mL  Status:  Discontinued        1,500 mg 150 mL/hr over 120 Minutes Intravenous Every 24 hours 04/29/22 1055 04/29/22 1357   04/29/22 1045  ceFEPIme  (MAXIPIME) 2 g in sodium chloride 0.9 % 100 mL IVPB  Status:  Discontinued        2 g 200 mL/hr over 30 Minutes Intravenous Every 8 hours 04/29/22 0954 04/29/22 1433   04/23/22 0730  ceFAZolin (ANCEF) IVPB 2g/100 mL premix        2 g 200 mL/hr over 30 Minutes Intravenous On call to O.R. 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   Dysphagia 06/03/2022   Pressure injury of skin 16/04/9603   Acute systolic heart failure (HCC)    Acute respiratory failure with hypoxia (HCC)    Septic shock ([place]) 04/29/2022   Aspiration pneumonia ([place]) 04/29/2022   Prolonged QT interval 04/29/2022   Essential hypertension 04/29/2022   Hernia of abdominal cavity 04/24/2022   Incisional hernia 04/23/2022   Chronic low back pain 11/20/2021   Limited mobility 11/20/2021   Ambulates with cane 11/20/2021   Ventral hernia without obstruction or gangrene 11/15/2021   Right carpal tunnel syndrome 11/15/2021   Diabetes mellitus without complication ([facility]) 54/03/8118   Insomnia 11/15/2021   Schizophrenia ([facility]) 05/21/2017   Morbid obesity ([facility]) 07/22/2014   Obstructive sleep apnea 04/05/2008  MIGRAINE HEADACHE 04/05/2008   Diabetes mellitus type 2 in obese [facility]) 03/21/2008   BIPOLAR DISORDER UNSPECIFIED 03/21/2008   DEPRESSION 03/21/2008   Asthma 03/21/2008    Awaiting return home with family caregiving.   50 Days Post-Op    LOS: 57 days   Matt B. Hassell Done, MD, [facility] [phone] to reach the surgeon on call.    06/20/2022 8:20 AM

## 2022-06-21 LAB — RENAL FUNCTION PANEL
Albumin: 2.6 g/dL — ABNORMAL LOW (ref 3.5–5.0)
Anion gap: 11 (ref 5–15)
BUN: 16 mg/dL (ref 6–20)
CO2: 25 mmol/L (ref 22–32)
Calcium: 9.4 mg/dL (ref 8.9–10.3)
Chloride: 103 mmol/L (ref 98–111)
Creatinine, Ser: 1.1 mg/dL — ABNORMAL HIGH (ref 0.44–1.00)
GFR, Estimated: >60 mL/min (ref >60)
Glucose, Bld: 100 mg/dL — ABNORMAL HIGH (ref 70–99)
Phosphorus: 5.7 mg/dL — ABNORMAL HIGH (ref 2.5–4.6)
Potassium: 3.5 mmol/L (ref 3.5–5.1)
Sodium: 139 mmol/L (ref 135–145)

## 2022-06-21 LAB — GLUCOSE, CAPILLARY
Glucose-Capillary: 140 mg/dL — ABNORMAL HIGH (ref 70–99)
Glucose-Capillary: 96 mg/dL (ref 70–99)

## 2022-06-21 NOTE — Progress Notes (Signed)
Nsg Discharge Note  Admit Date:  04/23/2022 Discharge date: 06/21/2022   YISELL SPRUNGER to be D/C'd Home per MD order.  AVS completed.  Patient/caregiver able to verbalize understanding.  Discharge Medication: Allergies as of 06/21/2022       Reactions   Iodinated Contrast Media Shortness Of Breath, Other (See Comments), Cough   Throat itching    Peanut-containing Drug Products Anaphylaxis   Peanut Oil Only.  Can eat peanuts with no problems   Oxycodone-acetaminophen Itching        Medication List     TAKE these medications    albuterol 108 (90 Base) MCG/ACT inhaler Commonly known as: VENTOLIN HFA Inhale 2 puffs into the lungs every 6 (six) hours as needed for wheezing or shortness of breath.   amLODipine 10 MG tablet Commonly known as: NORVASC Take 1 tablet (10 mg total) by mouth daily.   cyclobenzaprine 10 MG tablet Commonly known as: FLEXERIL Take 1 tablet (10 mg total) by mouth 3 (three) times daily as needed for muscle spasms. What changed:  how much to take when to take this   dicyclomine 20 MG tablet Commonly known as: BENTYL Take 1 tablet (20 mg total) by mouth 4 (four) times daily -  before meals and at bedtime.   diphenhydrAMINE 25 MG tablet Commonly known as: BENADRYL Take 25-50 mg by mouth every 6 (six) hours as needed for itching or allergies.   hydrochlorothiazide 25 MG tablet Commonly known as: HYDRODIURIL Take 1 tablet (25 mg total) by mouth daily.   hydrOXYzine 25 MG tablet Commonly known as: ATARAX Take 25 mg by mouth every 8 (eight) hours as needed for itching.   meloxicam 15 MG tablet Commonly known as: MOBIC Take 1 tablet (15 mg total) by mouth daily.   pantoprazole 40 MG tablet Commonly known as: PROTONIX Take 1 tablet (40 mg total) by mouth daily. What changed:  when to take this reasons to take this   topiramate 25 MG tablet Commonly known as: TOPAMAX Take 1 tablet (25 mg total) by mouth at bedtime.   traMADol 50 MG  tablet Commonly known as: Ultram Take 1 tablet (50 mg total) by mouth every 6 (six) hours as needed.   traZODone 100 MG tablet Commonly known as: DESYREL TAKE 1 TABLET BY MOUTH AT BEDTIME   Ubrelvy 100 MG Tabs Generic drug: Ubrogepant Take 1 tablet by mouth as needed (May repeat after 2 hours.  Maximum 2 tablets in 24 hours).   Wrist Brace//Left Large Misc 1 each by Does not apply route at bedtime.               Durable Medical Equipment  (From admission, onward)           Start     Ordered   06/18/22 1405  For home use only DME Walker rolling  Once       Question Answer Comment  Walker: With [place] Wheels   Patient needs a walker to treat with the following condition Weakness      06/18/22 1406   06/18/22 1405  For home use only DME standard manual wheelchair with seat cushion  Once       Comments: Patient suffers from s/p robotic incisional hernia repair, aspiration pneumonia  which impairs their ability to perform daily activities like ambulating  in the home.  A cane  will not resolve issue with performing activities of daily living. A wheelchair will allow patient to safely perform daily activities.  Patient can safely propel the wheelchair in the home or has a caregiver who can provide assistance. Length of need lifetime . Accessories: elevating leg rests (ELRs), wheel locks, extensions and anti-tippers.  Seat and back cushions   06/18/22 1406   06/18/22 1403  For home use only DME Other see comment  Once       Comments: DX incontinence   Diapers, pads , gloves wipes  Question:  Length of Need  Answer:  6 Months   06/18/22 1404            Discharge Assessment: Vitals:   06/21/22 0622 06/21/22 0814  BP: 109/76 110/63  Pulse: 88 86  Resp: 20 19  Temp: 97.7 F (36.5 C) 97.7 F (36.5 C)  SpO2: 100% 100%   Skin clean, dry and intact without evidence of skin break down, no evidence of skin tears noted. IV catheter discontinued intact. Site without  signs and symptoms of complications - no redness or edema noted at insertion site, patient denies c/o pain - only slight tenderness at site.  Dressing with slight pressure applied.  D/c Instructions-Education: Discharge instructions given to patient/family with verbalized understanding. D/c education completed with patient/family including follow up instructions, medication list, d/c activities limitations if indicated, with other d/c instructions as indicated by MD - patient able to verbalize understanding, all questions fully answered. Patient instructed to return to ED, call 911, or call MD for any changes in condition.  Patient escorted via [place], and D/C home via private auto.  Atilano Ina, RN 06/21/2022 12:20 PM

## 2022-06-21 NOTE — Progress Notes (Signed)
Patient ID: Patricia [name], female   DOB: 17-Oct-1970, 51 y.o.   MRN: [id number] [facility] Progress Note:   51 Days Post-Op   THE PLAN  Discharge with home health care today  Subjective: Mental status is alert and cooperative.  Complaints none. Objective: Vital signs in last 24 hours: Temp:  [97.7 F (36.5 C)-98.6 F (37 C)] 97.7 F (36.5 C) (11/24 0622) Pulse Rate:  [87-100] 88 (11/24 0622) Resp:  [17-20] 20 (11/24 0622) BP: (109-124)/(76-89) 109/76 (11/24 0622) SpO2:  [100 %] 100 % (11/24 0622) Weight:  [78 kg] 78 kg (11/24 0622)  Intake/Output from previous day: 11/23 0701 - 11/24 0700 In: -  Out: 1250 [Urine:1250] Intake/Output this shift: No intake/output data recorded.  Physical Exam: Work of breathing is OK at rest.  Abdominal wounds well healed  Lab Results:  Results for orders placed or performed during the hospital encounter of 04/23/22 (from the past 48 hour(s))  Glucose, capillary     Status: Abnormal   Collection Time: 06/19/22 11:31 AM  Result Value Ref Range   Glucose-Capillary 112 (H) 70 - 99 mg/dL    Comment: Glucose reference range applies only to samples taken after fasting for at least 8 hours.  Glucose, capillary     Status: Abnormal   Collection Time: 06/19/22  6:09 PM  Result Value Ref Range   Glucose-Capillary 117 (H) 70 - 99 mg/dL    Comment: Glucose reference range applies only to samples taken after fasting for at least 8 hours.  Glucose, capillary     Status: Abnormal   Collection Time: 06/20/22 12:25 AM  Result Value Ref Range   Glucose-Capillary 102 (H) 70 - 99 mg/dL    Comment: Glucose reference range applies only to samples taken after fasting for at least 8 hours.  Renal function panel     Status: Abnormal   Collection Time: 06/20/22  3:50 AM  Result Value Ref Range   Sodium 135 135 - 145 mmol/L   Potassium 3.9 3.5 - 5.1 mmol/L   Chloride 98 98 - 111 mmol/L   CO2 25 22 - 32 mmol/L   Glucose, Bld 106 (H) 70 - 99  mg/dL    Comment: Glucose reference range applies only to samples taken after fasting for at least 8 hours.   BUN 13 6 - 20 mg/dL   Creatinine, Ser 1.12 (H) 0.44 - 1.00 mg/dL   Calcium 10.3 8.9 - 10.3 mg/dL   Phosphorus 5.6 (H) 2.5 - 4.6 mg/dL   Albumin 2.7 (L) 3.5 - 5.0 g/dL   GFR, Estimated 59 (L) >60 mL/min    Comment: (NOTE) Calculated using the CKD-EPI Creatinine Equation (2021)    Anion gap 12 5 - 15    Comment: Performed at [facility] [address], [place], [place] [address]  Glucose, capillary     Status: Abnormal   Collection Time: 06/20/22  5:55 AM  Result Value Ref Range   Glucose-Capillary 111 (H) 70 - 99 mg/dL    Comment: Glucose reference range applies only to samples taken after fasting for at least 8 hours.  Glucose, capillary     Status: Abnormal   Collection Time: 06/20/22 11:41 AM  Result Value Ref Range   Glucose-Capillary 105 (H) 70 - 99 mg/dL    Comment: Glucose reference range applies only to samples taken after fasting for at least 8 hours.  Glucose, capillary     Status: Abnormal   Collection Time: 06/20/22  5:59 PM  Result Value Ref Range   Glucose-Capillary 130 (H) 70 - 99 mg/dL    Comment: Glucose reference range applies only to samples taken after fasting for at least 8 hours.  Glucose, capillary     Status: Abnormal   Collection Time: 06/20/22  9:38 PM  Result Value Ref Range   Glucose-Capillary 148 (H) 70 - 99 mg/dL    Comment: Glucose reference range applies only to samples taken after fasting for at least 8 hours.  Glucose, capillary     Status: Abnormal   Collection Time: 06/21/22 12:38 AM  Result Value Ref Range   Glucose-Capillary 140 (H) 70 - 99 mg/dL    Comment: Glucose reference range applies only to samples taken after fasting for at least 8 hours.  Renal function panel     Status: Abnormal   Collection Time: 06/21/22  2:21 AM  Result Value Ref Range   Sodium 139 135 - 145 mmol/L   Potassium 3.5 3.5 - 5.1 mmol/L   Chloride  103 98 - 111 mmol/L   CO2 25 22 - 32 mmol/L   Glucose, Bld 100 (H) 70 - 99 mg/dL    Comment: Glucose reference range applies only to samples taken after fasting for at least 8 hours.   BUN 16 6 - 20 mg/dL   Creatinine, Ser 1.10 (H) 0.44 - 1.00 mg/dL   Calcium 9.4 8.9 - 10.3 mg/dL   Phosphorus 5.7 (H) 2.5 - 4.6 mg/dL   Albumin 2.6 (L) 3.5 - 5.0 g/dL   GFR, Estimated >60 >60 mL/min    Comment: (NOTE) Calculated using the CKD-EPI Creatinine Equation (2021)    Anion gap 11 5 - 15    Comment: Performed at [facility] [address] [place], [place] [address]  Glucose, capillary     Status: None   Collection Time: 06/21/22  6:17 AM  Result Value Ref Range   Glucose-Capillary 96 70 - 99 mg/dL    Comment: Glucose reference range applies only to samples taken after fasting for at least 8 hours.    Radiology/Results: No results found.  Anti-infectives: Anti-infectives (From admission, onward)    Start     Dose/Rate Route Frequency Ordered Stop   06/04/22 2100  ceFAZolin (ANCEF) IVPB 2g/100 mL premix        2 g 200 mL/hr over 30 Minutes Intravenous Every 8 hours 06/04/22 1103 06/05/22 2037   06/01/22 1515  ceFEPIme (MAXIPIME) 2 g in sodium chloride 0.9 % 100 mL IVPB  Status:  Discontinued        2 g 200 mL/hr over 30 Minutes Intravenous Every 12 hours 06/01/22 1420 06/04/22 1103   05/15/22 1445  ceFAZolin (ANCEF) IVPB 2g/100 mL premix       Note to Pharmacy: To be given in IR   2 g 200 mL/hr over 30 Minutes Intravenous  Once 05/15/22 1352 05/15/22 1616   05/15/22 1230  ceFAZolin (ANCEF) powder 1 g  Status:  Discontinued        1 g Other To Surgery 05/15/22 1140 05/15/22 1239   05/15/22 1230  ceFAZolin (ANCEF) IVPB 2g/100 mL premix        2 g 200 mL/hr over 30 Minutes Intravenous To Radiology 05/15/22 1140 05/15/22 1140   05/15/22 0000  ceFAZolin (ANCEF) IVPB 2g/100 mL premix        2 g 200 mL/hr over 30 Minutes Intravenous To Radiology 05/14/22 1308 05/15/22 0005    05/13/22 2134  meropenem (MERREM) 500 mg in sodium chloride 0.9 % 100 mL IVPB        500 mg 200 mL/hr over 30 Minutes Intravenous Every 24 hours 05/13/22 0815 05/17/22 0700   05/13/22 1000  meropenem (MERREM) 500 mg in sodium chloride 0.9 % 100 mL IVPB  Status:  Discontinued        500 mg 200 mL/hr over 30 Minutes Intravenous Every 12 hours 05/12/22 0940 05/12/22 0946   05/12/22 2200  meropenem (MERREM) 500 mg in sodium chloride 0.9 % 100 mL IVPB  Status:  Discontinued        500 mg 200 mL/hr over 30 Minutes Intravenous Every 12 hours 05/12/22 0946 05/13/22 0815   05/12/22 2000  meropenem (MERREM) 1 g in sodium chloride 0.9 % 100 mL IVPB  Status:  Discontinued        1 g 200 mL/hr over 30 Minutes Intravenous  Once 05/12/22 0940 05/12/22 0946   05/06/22 1530  meropenem (MERREM) 1 g in sodium chloride 0.9 % 100 mL IVPB  Status:  Discontinued        1 g 200 mL/hr over 30 Minutes Intravenous Every 8 hours 05/06/22 1438 05/12/22 0940   05/04/22 1000  vancomycin (VANCOCIN) IVPB 1000 mg/200 mL premix  Status:  Discontinued        1,000 mg 200 mL/hr over 60 Minutes Intravenous Every 24 hours 05/03/22 0802 05/08/22 0913   05/03/22 0845  vancomycin (VANCOREADY) IVPB 2000 mg/400 mL        2,000 mg 200 mL/hr over 120 Minutes Intravenous  Once 05/03/22 0751 05/03/22 1114   05/01/22 1800  piperacillin-tazobactam (ZOSYN) IVPB 3.375 g  Status:  Discontinued        3.375 g 12.5 mL/hr over 240 Minutes Intravenous Every 6 hours 05/01/22 1426 05/01/22 1427   05/01/22 1515  piperacillin-tazobactam (ZOSYN) IVPB 3.375 g  Status:  Discontinued        3.375 g 100 mL/hr over 30 Minutes Intravenous Every 6 hours 05/01/22 1428 05/06/22 1438   04/30/22 1400  piperacillin-tazobactam (ZOSYN) IVPB 2.25 g  Status:  Discontinued        2.25 g 100 mL/hr over 30 Minutes Intravenous Every 8 hours 04/30/22 1325 05/01/22 1426   04/29/22 1530  piperacillin-tazobactam (ZOSYN) IVPB 3.375 g  Status:  Discontinued        3.375  g 12.5 mL/hr over 240 Minutes Intravenous Every 8 hours 04/29/22 1433 04/30/22 1325   04/29/22 1145  metroNIDAZOLE (FLAGYL) IVPB 500 mg  Status:  Discontinued        500 mg 100 mL/hr over 60 Minutes Intravenous Every 12 hours 04/29/22 1047 04/29/22 1433   04/29/22 1145  vancomycin (VANCOCIN) IVPB 1000 mg/200 mL premix  Status:  Discontinued        1,000 mg 200 mL/hr over 60 Minutes Intravenous  Once 04/29/22 1047 04/29/22 1055   04/29/22 1145  vancomycin (VANCOREADY) IVPB 1500 mg/300 mL  Status:  Discontinued        1,500 mg 150 mL/hr over 120 Minutes Intravenous Every 24 hours 04/29/22 1055 04/29/22 1357   04/29/22 1045  ceFEPIme (MAXIPIME) 2 g in sodium chloride 0.9 % 100 mL IVPB  Status:  Discontinued        2 g 200 mL/hr over 30 Minutes Intravenous Every 8 hours 04/29/22 0954 04/29/22 1433   04/23/22 0730  ceFAZolin (ANCEF) IVPB 2g/100 mL premix        2 g 200 mL/hr over 30 Minutes Intravenous On  call to O.R. 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   Dysphagia 06/03/2022   Pressure injury of skin 44/81/8563   Acute systolic heart failure ([place])    Acute respiratory failure with hypoxia (HCC)    Septic shock ([place]) 04/29/2022   Aspiration pneumonia ([place]) 04/29/2022   Prolonged QT interval 04/29/2022   Essential hypertension 04/29/2022   Hernia of abdominal cavity 04/24/2022   Incisional hernia 04/23/2022   Chronic low back pain 11/20/2021   Limited mobility 11/20/2021   Ambulates with cane 11/20/2021   Ventral hernia without obstruction or gangrene 11/15/2021   Right carpal tunnel syndrome 11/15/2021   Diabetes mellitus without complication ([place]) 14/97/0263   Insomnia 11/15/2021   Schizophrenia ([place]) 05/21/2017   Morbid obesity ([place]) 07/22/2014   Obstructive sleep apnea 04/05/2008   MIGRAINE HEADACHE 04/05/2008   Diabetes mellitus type 2 in obese ([place])  03/21/2008   BIPOLAR DISORDER UNSPECIFIED 03/21/2008   DEPRESSION 03/21/2008   Asthma 03/21/2008    Discharge arranged per Dr. Thermon Leyland  51 Days Post-Op    LOS: 59 days   Matt B. Hassell Done, MD, [facility] [phone] to reach the surgeon on call.    06/21/2022 7:57 AM

## 2022-06-21 NOTE — TOC Transition Note (Signed)
Transition of Care [facility]) - CM/SW Discharge Note   Patient Details  Name: Patricia [name] MRN: [id number] Date of Birth: 08-13-1970  Transition of Care [facility]) CM/SW Contact:  Tom-Johnson, Renea Ee, RN Phone Number: 06/21/2022, 12:31 PM   Clinical Narrative:     Patient is scheduled for discharge today. Outpatient info on AVS. Wheelchair delivered to patient at bedside. Safe Transportation scheduled at discharge. [employer] form explained to patient with understanding verbalized and patient signed form. Copy placed in chart. No further TOC needs noted.      Final next level of care: OP Rehab Barriers to Discharge: Barriers Resolved   Patient Goals and CMS Choice Patient states their goals for this hospitalization and ongoing recovery are:: To return home CMS Medicare.gov Compare Post Acute Care list provided to:: Patient Choice offered to / list presented to : Patient  Discharge Placement                Patient to be transferred to facility by: Safe Research officer, trade union and Services   Discharge Planning Services: CM Consult            DME Arranged: Youth worker wheelchair with seat cushion DME Agency: [employer] Date DME Agency Contacted: 06/21/22 Time DME Agency Contacted: 1200 Representative spoke with at Home: Jodell Cipro- Previous CM ordered. HH Arranged: NA HH Agency: NA        Social Determinants of Health (SDOH) Interventions Transportation Interventions: Inpatient TOC, Contracted Vendor Hospital doctor)   Readmission Risk Interventions     No data to display

## 2022-06-21 NOTE — Plan of Care (Signed)
  Problem: Education: Goal: Knowledge of General Education information will improve Description: Including pain rating scale, medication(s)/side effects and non-pharmacologic comfort measures Outcome: Progressing   Problem: Coping: Goal: Level of anxiety will decrease Outcome: Progressing   Problem: Pain Managment: Goal: General experience of comfort will improve Outcome: Progressing   Problem: Safety: Goal: Ability to remain free from injury will improve Outcome: Progressing   Problem: Coping: Goal: Ability to adjust to condition or change in health will improve Outcome: Progressing

## 2022-06-24 ENCOUNTER — Telehealth: Payer: Self-pay

## 2022-06-24 NOTE — Telephone Encounter (Signed)
Caller Name: [name], sister Call back phone #: [phone]  REFERRAL REQUEST Has patient seen PCP for this complaint? no - - IF NO, is insurance requiring patient see PCP for this issue before PCP can refer them?  She called [facility] as advised by the hospital and was told she needs referral from PCP.  I did advise Latricia that PCP may not be able to refer until pt is seen (12/4). Referral for which specialty: PT and OT Preferred provider/office: [facility] Reason for referral: Patient is not able to stand or ambulate at all. [name] is moving her legs and massaging them as she saw them doing in the hospital. [name] notes that the patient was only getting PT/OT 1x week when admitted.

## 2022-06-24 NOTE — Telephone Encounter (Signed)
Tried calling patient's sister, [name], but phone number provided it not in service. Spoke with patient and advised her that PT & OT referral was placed on 06/18/22 by hospital and to reach out to them if she hasn't heard from them by the end of the week. I advised patient that form will be addressed at visit on 07/01/22 with Dr. Grandville Silos. She verbalized understanding.

## 2022-06-24 NOTE — Telephone Encounter (Signed)
Caller Name: [name], sister Call back phone #: [phone]  Reason for Call: pt sister calling again stating Medicaid notified her to increase hours for [facility] aide PCP needs to complet [id number] form. She is requesting [place] aide 8-12 hours/day for ADL (bathing, changing diapers, feeding, meal prep, etc).  I did advise that referrals for PT/OT would be addressed at pt visit and this may be this same.  She is requesting notification once we have response on this form request.

## 2022-06-24 NOTE — Telephone Encounter (Signed)
Transition Care Management Follow-up Telephone Call Date of discharge and from where: 06/21/22 [facility]. Dx: septic shock (admitted since 04/23/22) How have you been since you were released from the hospital? I'm doing ok. Any questions or concerns? Yes Pt states she needs an order for adult diapers, wipes, chucks pads, and a walker sent to [facility] in [place], [place].  Items Reviewed: Did the pt receive and understand the discharge instructions provided? Yes  Medications obtained and verified? No  Other? No  Any new allergies since your discharge? No  Dietary orders reviewed? No Do you have support at home? Yes   Home Care and Equipment/Supplies: Were home health services ordered? not applicable If so, what is the name of the agency? N/a  Has the agency set up a time to come to the patient's home? not applicable Were any new equipment or medical supplies ordered?  No What is the name of the medical supply agency? N/a Were you able to get the supplies/equipment? not applicable Do you have any questions related to the use of the equipment or supplies? No  Functional Questionnaire: (I = Independent and D = Dependent) ADLs: D  Bathing/Dressing- D  Meal Prep- D  Eating- D  Maintaining continence- D  Transferring/Ambulation- D  Managing Meds- D  Follow up appointments reviewed:  PCP Hospital f/u appt confirmed? Yes  Scheduled to see Dr. Grandville Silos on 07/01/22 @ 11:00am. [facility] f/u appt confirmed? Yes  Scheduled to see unknown on 08/02/22 @ unknown. Are transportation arrangements needed? No  If their condition worsens, is the pt aware to call PCP or go to the Emergency Dept.? Yes Was the patient provided with contact information for the PCP's office or ED? Yes Was to pt encouraged to call back with questions or concerns? Yes  Angeline Slim, RN, BSN Rn Clinical Supervisor [facility] [employer]

## 2022-06-28 ENCOUNTER — Other Ambulatory Visit (HOSPITAL_COMMUNITY): Payer: Self-pay | Admitting: Specialist

## 2022-06-28 DIAGNOSIS — R1312 Dysphagia, oropharyngeal phase: Secondary | ICD-10-CM

## 2022-07-01 ENCOUNTER — Inpatient Hospital Stay: Payer: Medicaid Other | Admitting: Family Medicine

## 2022-07-01 ENCOUNTER — Telehealth: Payer: Self-pay | Admitting: Family Medicine

## 2022-07-01 NOTE — Discharge Summary (Signed)
Patient ID: Patricia [name] [id number] 51 y.o. Sep 12, 1970  04/23/2022  Discharge date and time: 05/21/22  Admitting Physician: Patricia [name]  Discharge Physician: Patricia [name]  Admission Diagnoses: Incisional hernia [K43.2] Hernia of abdominal cavity [K46.9] Patient Active Problem List   Diagnosis Date Noted   Tracheostomy dependence (Patricia [name]) 06/03/2022   Dysphagia 06/03/2022   Pressure injury of skin 47/82/9562   Acute systolic heart failure (HCC)    Acute respiratory failure with hypoxia (HCC)    Septic shock (Patricia [name]) 04/29/2022   Aspiration pneumonia (Patricia [name]) 04/29/2022   Prolonged QT interval 04/29/2022   Essential hypertension 04/29/2022   Hernia of abdominal cavity 04/24/2022   Incisional hernia 04/23/2022   Chronic low back pain 11/20/2021   Limited mobility 11/20/2021   Ambulates with cane 11/20/2021   Ventral hernia without obstruction or gangrene 11/15/2021   Right carpal tunnel syndrome 11/15/2021   Diabetes mellitus without complication ([place]) 13/02/6577   Insomnia 11/15/2021   Schizophrenia ([place]) 05/21/2017   Morbid obesity ([place]) 07/22/2014   Obstructive sleep apnea 04/05/2008   MIGRAINE HEADACHE 04/05/2008   Diabetes mellitus type 2 in obese ([place]) 03/21/2008   BIPOLAR DISORDER UNSPECIFIED 03/21/2008   DEPRESSION 03/21/2008   Asthma 03/21/2008     Discharge Diagnoses:  Patient Active Problem List   Diagnosis Date Noted   Tracheostomy dependence (Patricia [name]) 06/03/2022   Dysphagia 06/03/2022   Pressure injury of skin 46/96/2952   Acute systolic heart failure (Patricia [name])    Acute respiratory failure with hypoxia (HCC)    Septic shock (Patricia [name]) 04/29/2022   Aspiration pneumonia (Patricia [name]) 04/29/2022   Prolonged QT interval 04/29/2022   Essential hypertension 04/29/2022   Hernia of abdominal cavity 04/24/2022   Incisional hernia 04/23/2022   Chronic low back pain 11/20/2021   Limited mobility 11/20/2021   Ambulates with cane 11/20/2021   Ventral hernia without  obstruction or gangrene 11/15/2021   Right carpal tunnel syndrome 11/15/2021   Diabetes mellitus without complication ([place]) 84/13/2440   Insomnia 11/15/2021   Schizophrenia ([place]) 05/21/2017   Morbid obesity ([place]) 07/22/2014   Obstructive sleep apnea 04/05/2008   MIGRAINE HEADACHE 04/05/2008   Diabetes mellitus type 2 in obese ([place]) 03/21/2008   BIPOLAR DISORDER UNSPECIFIED 03/21/2008   DEPRESSION 03/21/2008   Asthma 03/21/2008    Operations: Procedure(s): Coronary/Graft Acute MI Revascularization LEFT HEART CATH AND CORONARY ANGIOGRAPHY RIGHT/LEFT HEART CATH AND CORONARY ANGIOGRAPHY  Admission Condition: good  Discharged Condition: fair  Indication for Admission: Hernia  Hospital Course:  Patricia [name] is s/p robotic incisional hernia repair on 04/23/22  She had an aspiration event and became critically ill.  She had a prolonged hospital stay in the ICU dealing with the following.    Acute metabolic encephalopathy superimposed on hx bipolar disorder, schizophrenia   Acute biventricular HFrEF (Etiology sepsis, stress induced CM. Has clean coronaries) Aspiration PNA Pulm edema   RLL VAP  Anemia, acute on chronic  DM2 with hyperglycemia  Severe protein calorie malnutrition  Deconditioning    She required dialysis and tracheostomy while inpatient, but kidneys recovered and she was decannulated prior to discharge.   Consults: nephrology, cardiology, pulmonary critical care, internal medicine  Significant Diagnostic Studies: See EMR  Treatments: As above  Disposition: Home with home health  Patient Instructions:  Allergies as of 06/21/2022       Reactions   Iodinated Contrast Media Shortness Of Breath, Other (See Comments), Cough   Throat itching    Peanut-containing Drug Products Anaphylaxis   Peanut Oil Only.  Can  eat peanuts with no problems   Oxycodone-acetaminophen Itching        Medication List     TAKE these medications    albuterol 108 (90 Base)  MCG/ACT inhaler Commonly known as: VENTOLIN HFA Inhale 2 puffs into the lungs every 6 (six) hours as needed for wheezing or shortness of breath.   amLODipine 10 MG tablet Commonly known as: NORVASC Take 1 tablet (10 mg total) by mouth daily.   cyclobenzaprine 10 MG tablet Commonly known as: FLEXERIL Take 1 tablet (10 mg total) by mouth 3 (three) times daily as needed for muscle spasms. What changed:  how much to take when to take this   dicyclomine 20 MG tablet Commonly known as: BENTYL Take 1 tablet (20 mg total) by mouth 4 (four) times daily -  before meals and at bedtime.   diphenhydrAMINE 25 MG tablet Commonly known as: BENADRYL Take 25-50 mg by mouth every 6 (six) hours as needed for itching or allergies.   hydrochlorothiazide 25 MG tablet Commonly known as: HYDRODIURIL Take 1 tablet (25 mg total) by mouth daily.   hydrOXYzine 25 MG tablet Commonly known as: ATARAX Take 25 mg by mouth every 8 (eight) hours as needed for itching.   meloxicam 15 MG tablet Commonly known as: MOBIC Take 1 tablet (15 mg total) by mouth daily.   pantoprazole 40 MG tablet Commonly known as: PROTONIX Take 1 tablet (40 mg total) by mouth daily. What changed:  when to take this reasons to take this   topiramate 25 MG tablet Commonly known as: TOPAMAX Take 1 tablet (25 mg total) by mouth at bedtime.   traMADol 50 MG tablet Commonly known as: Ultram Take 1 tablet (50 mg total) by mouth every 6 (six) hours as needed.   traZODone 100 MG tablet Commonly known as: DESYREL TAKE 1 TABLET BY MOUTH AT BEDTIME   Ubrelvy 100 MG Tabs Generic drug: Ubrogepant Take 1 tablet by mouth as needed (May repeat after 2 hours.  Maximum 2 tablets in 24 hours).   Wrist Brace//Left Large Misc 1 each by Does not apply route at bedtime.        Activity:  HH PT Diet: regular diet Wound Care: keep wound clean and dry  Follow-up:  With Dr. Thermon [name]  Signed: Nickola [name] Patricia [name] General,  Bariatric, & Minimally Invasive Surgery [facility], [place]   07/01/2022, 7:15 AM

## 2022-07-01 NOTE — Telephone Encounter (Signed)
12.4.23 no show letter sent

## 2022-07-02 ENCOUNTER — Encounter: Payer: Self-pay | Admitting: Family Medicine

## 2022-07-02 ENCOUNTER — Ambulatory Visit (INDEPENDENT_AMBULATORY_CARE_PROVIDER_SITE_OTHER): Payer: Medicaid Other | Admitting: Family Medicine

## 2022-07-02 ENCOUNTER — Telehealth: Payer: Self-pay

## 2022-07-02 ENCOUNTER — Other Ambulatory Visit (HOSPITAL_COMMUNITY): Payer: Self-pay

## 2022-07-02 VITALS — BP 130/82 | HR 49 | Temp 98.1°F

## 2022-07-02 DIAGNOSIS — Z993 Dependence on wheelchair: Secondary | ICD-10-CM | POA: Diagnosis not present

## 2022-07-02 DIAGNOSIS — R600 Localized edema: Secondary | ICD-10-CM | POA: Diagnosis not present

## 2022-07-02 DIAGNOSIS — M79605 Pain in left leg: Secondary | ICD-10-CM | POA: Diagnosis not present

## 2022-07-02 DIAGNOSIS — I509 Heart failure, unspecified: Secondary | ICD-10-CM | POA: Diagnosis not present

## 2022-07-02 DIAGNOSIS — R29898 Other symptoms and signs involving the musculoskeletal system: Secondary | ICD-10-CM

## 2022-07-02 DIAGNOSIS — M79604 Pain in right leg: Secondary | ICD-10-CM | POA: Diagnosis not present

## 2022-07-02 LAB — COMPREHENSIVE METABOLIC PANEL
ALT: 16 U/L (ref 0–35)
AST: 16 U/L (ref 0–37)
Albumin: 4.2 g/dL (ref 3.5–5.2)
Alkaline Phosphatase: 65 U/L (ref 39–117)
BUN: 16 mg/dL (ref 6–23)
CO2: 30 mEq/L (ref 19–32)
Calcium: 10.3 mg/dL (ref 8.4–10.5)
Chloride: 98 mEq/L (ref 96–112)
Creatinine, Ser: 1.08 mg/dL (ref 0.40–1.20)
GFR: 59.1 mL/min — ABNORMAL LOW (ref >60.00)
Glucose, Bld: 102 mg/dL — ABNORMAL HIGH (ref 70–99)
Potassium: 3.2 mEq/L — ABNORMAL LOW (ref 3.5–5.1)
Sodium: 139 mEq/L (ref 135–145)
Total Bilirubin: 0.4 mg/dL (ref 0.2–1.2)
Total Protein: 8.4 g/dL — ABNORMAL HIGH (ref 6.0–8.3)

## 2022-07-02 LAB — CBC WITH DIFFERENTIAL/PLATELET
Basophils Absolute: 0.1 10*3/uL (ref 0.0–0.1)
Basophils Relative: 0.6 % (ref 0.0–3.0)
Eosinophils Absolute: 0.1 10*3/uL (ref 0.0–0.7)
Eosinophils Relative: 1.2 % (ref 0.0–5.0)
HCT: 35.3 % — ABNORMAL LOW (ref 36.0–46.0)
Hemoglobin: 11.4 g/dL — ABNORMAL LOW (ref 12.0–15.0)
Lymphocytes Relative: 20.2 % (ref 12.0–46.0)
Lymphs Abs: 2.2 10*3/uL (ref 0.7–4.0)
MCHC: 32.4 g/dL (ref 30.0–36.0)
MCV: 82.4 fl (ref 78.0–100.0)
Monocytes Absolute: 0.6 10*3/uL (ref 0.1–1.0)
Monocytes Relative: 5.7 % (ref 3.0–12.0)
Neutro Abs: 7.8 10*3/uL — ABNORMAL HIGH (ref 1.4–7.7)
Neutrophils Relative %: 72.3 % (ref 43.0–77.0)
Platelets: 453 10*3/uL — ABNORMAL HIGH (ref 150.0–400.0)
RBC: 4.28 Mil/uL (ref 3.87–5.11)
RDW: 16 % — ABNORMAL HIGH (ref 11.5–15.5)
WBC: 10.8 10*3/uL — ABNORMAL HIGH (ref 4.0–10.5)

## 2022-07-02 LAB — BRAIN NATRIURETIC PEPTIDE: Pro B Natriuretic peptide (BNP): 12 pg/mL (ref 0.0–100.0)

## 2022-07-02 MED ORDER — HYDROCODONE-ACETAMINOPHEN 10-325 MG PO TABS: 1.0000 | ORAL_TABLET | Freq: Four times a day (QID) | ORAL | 0 refills | Status: AC | PRN

## 2022-07-02 MED ORDER — KETOROLAC TROMETHAMINE 30 MG/ML IJ SOLN
30.0000 mg | Freq: Once | INTRAMUSCULAR | Status: AC
  Administered 2022-07-02: 30 mg via INTRAMUSCULAR

## 2022-07-02 MED ORDER — DOXYCYCLINE HYCLATE 100 MG PO TABS: 100.0000 mg | ORAL_TABLET | Freq: Two times a day (BID) | ORAL | 0 refills | Status: AC

## 2022-07-02 MED ORDER — INCONTINENCE SUPPLIES MISC: 1.0000 | 0 refills | Status: AC | PRN

## 2022-07-02 NOTE — Telephone Encounter (Signed)
Pt said transportation arrived late and would not arrive until 11:40-12:00p - pt rescheduled and seen 12/5  No fee generated. Did not count as no show.  Pt was advised by front office that we are not in network with [facility] and may need to find new PCP.

## 2022-07-02 NOTE — Telephone Encounter (Signed)
Pharmacy Patient Advocate Encounter   Received notification from [facility] that prior authorization for Hydro/APAP 10/'325mg'$  is required/requested.   PA submitted on 07/02/2022 to [place] via [place]  Status is pending
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
## 2022-07-02 NOTE — Assessment & Plan Note (Signed)
Bilateral lower extremity swelling, with significant erythema and pain on the right extremity.  There is cracks can on the lower feet, there is concern for possible cellulitis.  Patient also has been immobile due to recent hospitalization and limited mobility, cannot rule out DVT.  Patient is without chest pain or shortness of breath.,  Less likely to be heart failure

## 2022-07-02 NOTE — Patient Instructions (Addendum)
For leg swelling and pain, we are ordering an ultrasound, starting doxycyline and hydrocodone for pain.   We will order requested DME and home health supplies as requested.   We will refer to PT OT and cardiology.   If getting worsening leg pain, swelling, chest pain, shortness of breath or any other symptoms, go to ED

## 2022-07-03 ENCOUNTER — Telehealth: Payer: Medicaid Other | Admitting: Family Medicine

## 2022-07-03 ENCOUNTER — Other Ambulatory Visit (HOSPITAL_COMMUNITY): Payer: Self-pay

## 2022-07-03 NOTE — Telephone Encounter (Signed)
Caller Name: Lauralye Kinn Call back phone #: [phone]  Reason for Call: Please call pt, she is looking for an update from yesterday's (12/06) appt

## 2022-07-03 NOTE — Telephone Encounter (Signed)
Patient called in inquiring where her incontinence supplies were sent. I don't see a pharmacy listed on the script. I also advised patient that we are still working on the form as well.

## 2022-07-04 ENCOUNTER — Encounter: Payer: Self-pay | Admitting: Family Medicine

## 2022-07-04 DIAGNOSIS — I509 Heart failure, unspecified: Secondary | ICD-10-CM

## 2022-07-04 DIAGNOSIS — Z993 Dependence on wheelchair: Secondary | ICD-10-CM | POA: Insufficient documentation

## 2022-07-04 DIAGNOSIS — R29898 Other symptoms and signs involving the musculoskeletal system: Secondary | ICD-10-CM | POA: Insufficient documentation

## 2022-07-04 HISTORY — DX: Heart failure, unspecified: I50.9

## 2022-07-04 NOTE — Telephone Encounter (Signed)
Pharmacy Patient Advocate Encounter  Received notification from [facility] that the request for prior authorization for Hydrocodone/APAP 10-325 mg has been denied due to .

## 2022-07-04 NOTE — Telephone Encounter (Signed)
Patient states that she is still having swelling in her feet and legs. Spoke with Dr. Grandville Silos and he stated that she would need to go to ED.

## 2022-07-04 NOTE — Telephone Encounter (Signed)
Contacted patient regarding [id number] Form and advised them the fax number on the form was inactive. Patient provided me with the phone number to [facility] [phone].

## 2022-07-04 NOTE — Telephone Encounter (Signed)
Caller Name: Melissaann Dizdarevic Call back phone #: [phone]  Reason for Call: Please contact pt again with update. She is concerned because feet are still swollen

## 2022-07-04 NOTE — Telephone Encounter (Signed)
Left patient a detailed voice message to return call to office and to advise patient that form and rx for incontinence supplies are ready for pick up

## 2022-07-05 NOTE — Telephone Encounter (Signed)
Contacted [facility] for their fax number. [id number] FORM faxed to number provided [phone].

## 2022-07-09 ENCOUNTER — Ambulatory Visit: Payer: Medicaid Other | Admitting: Cardiology

## 2022-07-11 ENCOUNTER — Ambulatory Visit (HOSPITAL_COMMUNITY): Payer: Medicaid Other | Attending: Family Medicine | Admitting: Speech Pathology

## 2022-07-11 ENCOUNTER — Ambulatory Visit (HOSPITAL_COMMUNITY)
Admission: RE | Admit: 2022-07-11 | Discharge: 2022-07-11 | Disposition: A | Discharge status: Home / Self Care | Payer: Medicaid Other | Source: Ambulatory Visit | Attending: Surgery | Admitting: Surgery

## 2022-07-11 DIAGNOSIS — R1312 Dysphagia, oropharyngeal phase: Secondary | ICD-10-CM | POA: Insufficient documentation

## 2022-07-12 ENCOUNTER — Emergency Department (HOSPITAL_COMMUNITY): Payer: Medicaid Other

## 2022-07-12 ENCOUNTER — Emergency Department (HOSPITAL_COMMUNITY)
Admission: EM | Admit: 2022-07-12 | Discharge: 2022-07-13 | Disposition: A | Discharge status: Home / Self Care | Payer: Medicaid Other | Attending: Emergency Medicine | Admitting: Emergency Medicine

## 2022-07-12 ENCOUNTER — Other Ambulatory Visit: Payer: Self-pay

## 2022-07-12 ENCOUNTER — Encounter (HOSPITAL_COMMUNITY): Payer: Self-pay | Admitting: Emergency Medicine

## 2022-07-12 DIAGNOSIS — R2243 Localized swelling, mass and lump, lower limb, bilateral: Secondary | ICD-10-CM | POA: Insufficient documentation

## 2022-07-12 DIAGNOSIS — M79672 Pain in left foot: Secondary | ICD-10-CM | POA: Diagnosis not present

## 2022-07-12 DIAGNOSIS — Z91010 Allergy to peanuts: Secondary | ICD-10-CM | POA: Diagnosis not present

## 2022-07-12 DIAGNOSIS — J45909 Unspecified asthma, uncomplicated: Secondary | ICD-10-CM | POA: Insufficient documentation

## 2022-07-12 DIAGNOSIS — G8929 Other chronic pain: Secondary | ICD-10-CM

## 2022-07-12 DIAGNOSIS — M7989 Other specified soft tissue disorders: Secondary | ICD-10-CM

## 2022-07-12 DIAGNOSIS — M79671 Pain in right foot: Secondary | ICD-10-CM | POA: Diagnosis present

## 2022-07-12 DIAGNOSIS — E119 Type 2 diabetes mellitus without complications: Secondary | ICD-10-CM | POA: Insufficient documentation

## 2022-07-12 DIAGNOSIS — R000 Tachycardia, unspecified: Secondary | ICD-10-CM | POA: Diagnosis not present

## 2022-07-12 DIAGNOSIS — M79604 Pain in right leg: Secondary | ICD-10-CM | POA: Diagnosis not present

## 2022-07-12 DIAGNOSIS — I509 Heart failure, unspecified: Secondary | ICD-10-CM | POA: Insufficient documentation

## 2022-07-12 DIAGNOSIS — Z79899 Other long term (current) drug therapy: Secondary | ICD-10-CM | POA: Insufficient documentation

## 2022-07-12 DIAGNOSIS — Z7951 Long term (current) use of inhaled steroids: Secondary | ICD-10-CM | POA: Insufficient documentation

## 2022-07-12 DIAGNOSIS — R110 Nausea: Secondary | ICD-10-CM | POA: Insufficient documentation

## 2022-07-12 DIAGNOSIS — M79605 Pain in left leg: Secondary | ICD-10-CM | POA: Diagnosis not present

## 2022-07-12 LAB — URINALYSIS, ROUTINE W REFLEX MICROSCOPIC
Bilirubin Urine: NEGATIVE
Glucose, UA: NEGATIVE mg/dL
Hgb urine dipstick: NEGATIVE
Ketones, ur: NEGATIVE mg/dL
Leukocytes,Ua: NEGATIVE
Nitrite: NEGATIVE
Protein, ur: 30 mg/dL — AB
Specific Gravity, Urine: 1.035 — ABNORMAL HIGH (ref 1.005–1.030)
pH: 7 (ref 5.0–8.0)

## 2022-07-12 LAB — RAPID URINE DRUG SCREEN, HOSP PERFORMED
Amphetamines: NOT DETECTED
Barbiturates: NOT DETECTED
Benzodiazepines: NOT DETECTED
Cocaine: NOT DETECTED
Opiates: POSITIVE — AB
Tetrahydrocannabinol: POSITIVE — AB

## 2022-07-12 LAB — CBC WITH DIFFERENTIAL/PLATELET
Abs Immature Granulocytes: 0.03 10*3/uL (ref 0.00–0.07)
Basophils Absolute: 0.1 10*3/uL (ref 0.0–0.1)
Basophils Relative: 1 %
Eosinophils Absolute: 0.1 10*3/uL (ref 0.0–0.5)
Eosinophils Relative: 1 %
HCT: 34.5 % — ABNORMAL LOW (ref 36.0–46.0)
Hemoglobin: 10.7 g/dL — ABNORMAL LOW (ref 12.0–15.0)
Immature Granulocytes: 0 %
Lymphocytes Relative: 31 %
Lymphs Abs: 3 10*3/uL (ref 0.7–4.0)
MCH: 26.6 pg (ref 26.0–34.0)
MCHC: 31 g/dL (ref 30.0–36.0)
MCV: 85.8 fL (ref 80.0–100.0)
Monocytes Absolute: 0.6 10*3/uL (ref 0.1–1.0)
Monocytes Relative: 6 %
Neutro Abs: 5.8 10*3/uL (ref 1.7–7.7)
Neutrophils Relative %: 61 %
Platelets: 425 10*3/uL — ABNORMAL HIGH (ref 150–400)
RBC: 4.02 MIL/uL (ref 3.87–5.11)
RDW: 15.3 % (ref 11.5–15.5)
WBC: 9.5 10*3/uL (ref 4.0–10.5)
nRBC: 0 % (ref 0.0–0.2)

## 2022-07-12 LAB — COMPREHENSIVE METABOLIC PANEL
ALT: 17 U/L (ref 0–44)
AST: 20 U/L (ref 15–41)
Albumin: 3.4 g/dL — ABNORMAL LOW (ref 3.5–5.0)
Alkaline Phosphatase: 55 U/L (ref 38–126)
Anion gap: 9 (ref 5–15)
BUN: 15 mg/dL (ref 6–20)
CO2: 28 mmol/L (ref 22–32)
Calcium: 9.4 mg/dL (ref 8.9–10.3)
Chloride: 104 mmol/L (ref 98–111)
Creatinine, Ser: 0.84 mg/dL (ref 0.44–1.00)
GFR, Estimated: >60 mL/min (ref >60)
Glucose, Bld: 107 mg/dL — ABNORMAL HIGH (ref 70–99)
Potassium: 3 mmol/L — ABNORMAL LOW (ref 3.5–5.1)
Sodium: 141 mmol/L (ref 135–145)
Total Bilirubin: 0.2 mg/dL — ABNORMAL LOW (ref 0.3–1.2)
Total Protein: 7.4 g/dL (ref 6.5–8.1)

## 2022-07-12 LAB — D-DIMER, QUANTITATIVE: D-Dimer, Quant: 0.89 ug/mL-FEU — ABNORMAL HIGH (ref 0.00–0.50)

## 2022-07-12 LAB — BRAIN NATRIURETIC PEPTIDE: B Natriuretic Peptide: 21 pg/mL (ref 0.0–100.0)

## 2022-07-12 MED ORDER — TRAMADOL HCL 50 MG PO TABS: 50.0000 mg | ORAL_TABLET | Freq: Four times a day (QID) | ORAL | 0 refills | Status: DC | PRN

## 2022-07-12 MED ORDER — IOHEXOL 350 MG/ML SOLN
80.0000 mL | Freq: Once | INTRAVENOUS | Status: AC | PRN
  Administered 2022-07-12: 80 mL via INTRAVENOUS

## 2022-07-12 MED ORDER — POTASSIUM CHLORIDE CRYS ER 10 MEQ PO TBCR: 10.0000 meq | EXTENDED_RELEASE_TABLET | Freq: Two times a day (BID) | ORAL | 0 refills | Status: AC

## 2022-07-12 MED ORDER — PREDNISONE 50 MG PO TABS
50.0000 mg | ORAL_TABLET | Freq: Four times a day (QID) | ORAL | Status: AC
  Administered 2022-07-12 (×2): 50 mg via ORAL
  Filled 2022-07-12 (×2): qty 1

## 2022-07-12 MED ORDER — HYDROCODONE-ACETAMINOPHEN 5-325 MG PO TABS
1.0000 | ORAL_TABLET | Freq: Once | ORAL | Status: AC
  Administered 2022-07-12: 1 via ORAL
  Filled 2022-07-12: qty 1

## 2022-07-12 MED ORDER — POTASSIUM CHLORIDE 10 MEQ/100ML IV SOLN
10.0000 meq | INTRAVENOUS | Status: AC
  Administered 2022-07-12 (×3): 10 meq via INTRAVENOUS
  Filled 2022-07-12 (×3): qty 100

## 2022-07-12 MED ORDER — KETOROLAC TROMETHAMINE 30 MG/ML IJ SOLN
30.0000 mg | Freq: Once | INTRAMUSCULAR | Status: AC
  Administered 2022-07-12: 30 mg via INTRAVENOUS
  Filled 2022-07-12: qty 1

## 2022-07-12 MED ORDER — LORAZEPAM 2 MG/ML IJ SOLN
0.5000 mg | Freq: Once | INTRAMUSCULAR | Status: AC
  Administered 2022-07-12: 0.5 mg via INTRAVENOUS
  Filled 2022-07-12: qty 1

## 2022-07-12 MED ORDER — DIPHENHYDRAMINE HCL 25 MG PO CAPS
50.0000 mg | ORAL_CAPSULE | Freq: Once | ORAL | Status: AC
  Administered 2022-07-12: 50 mg via ORAL
  Filled 2022-07-12: qty 2

## 2022-07-12 NOTE — ED Provider Notes (Signed)
[facility] EMERGENCY DEPARTMENT Provider Note   CSN: [id number] Arrival date & time: 07/12/22  0745     History {Add pertinent medical, surgical, social history, OB history to HPI:1} Chief Complaint  Patient presents with   Foot Pain   HPI Patricia [name] is a 51 y.o. female with asthma, schizophrenia, congestive heart failure, bipolar 1, diabetes presenting for bilateral leg swelling.  Started 2 weeks ago.  Swelling is in both her feet and extends up to her calves. Right is more swollen than left.  Does endorse bilateral calf tenderness.  Feels painful and like her legs are "bursting".  She is taking pain medication prescribed by her PCP painful.  Denies shortness of breath and chest pain.  States she has been nauseous. States she was recently hospitalized for 2 months after hernia repair.  States that she did not ambulate very often during hospitalization has been working with physical therapy to reestablish capacity to ambulate. Denies trauma to legs.   Foot Pain      Home Medications Prior to Admission medications   Medication Sig Start Date End Date Taking? Authorizing Provider  albuterol (VENTOLIN HFA) 108 (90 Base) MCG/ACT inhaler Inhale 2 puffs into the lungs every 6 (six) hours as needed for wheezing or shortness of breath. 11/19/21   [name], MD  amLODipine (NORVASC) 10 MG tablet Take 1 tablet (10 mg total) by mouth daily. 11/19/21 04/11/22  [name], MD  cyclobenzaprine (FLEXERIL) 10 MG tablet Take 1 tablet (10 mg total) by mouth 3 (three) times daily as needed for muscle spasms. Patient taking differently: Take 30 mg by mouth at bedtime. 01/16/22   Pieter Partridge, DO  dicyclomine (BENTYL) 20 MG tablet Take 1 tablet (20 mg total) by mouth 4 (four) times daily -  before meals and at bedtime. Patient not taking: Reported on 04/11/2022 11/19/21 02/17/22  [name], MD  diphenhydrAMINE (BENADRYL) 25 MG tablet Take 25-50 mg by mouth every 6 (six) hours as  needed for itching or allergies.    [provider]  Elastic Bandages & Supports (WRIST BRACE//LEFT LARGE) MISC 1 each by Does not apply route at bedtime. 11/19/21   [name], MD  hydrochlorothiazide (HYDRODIURIL) 25 MG tablet Take 1 tablet (25 mg total) by mouth daily. 11/19/21 04/11/22  [name], MD  hydrOXYzine (ATARAX) 25 MG tablet Take 25 mg by mouth every 8 (eight) hours as needed for itching.    [provider]  Incontinence Supplies [place] 1 each by Does not apply route as needed (Incontinence). 07/02/22 06/27/23  [name], MD  pantoprazole (PROTONIX) 40 MG tablet Take 1 tablet (40 mg total) by mouth daily. Patient taking differently: Take 40 mg by mouth daily as needed (Reflux). 11/19/21 04/11/22  [name], MD  topiramate (TOPAMAX) 25 MG tablet Take 1 tablet (25 mg total) by mouth at bedtime. Patient not taking: Reported on 04/11/2022 01/16/22   Pieter Partridge, DO  traMADol (ULTRAM) 50 MG tablet Take 1 tablet (50 mg total) by mouth every 6 (six) hours as needed. Patient not taking: Reported on 07/02/2022 06/19/22 06/19/23  Stechschulte, Nickola Major, MD  traZODone (DESYREL) 100 MG tablet TAKE 1 TABLET BY MOUTH AT BEDTIME 04/22/22   Haydee Salter, MD  Ubrogepant (UBRELVY) 100 MG TABS Take 1 tablet by mouth as needed (May repeat after 2 hours.  Maximum 2 tablets in 24 hours). 01/16/22   Pieter Partridge, DO  Allergies    Iodinated contrast media, Peanut-containing drug products, and Oxycodone-acetaminophen    Review of Systems   Review of Systems  Musculoskeletal:        Bilateral lower leg swelling    Physical Exam Updated Vital Signs BP 131/75   Pulse (!) 110   Temp 98.4 F (36.9 C) (Oral)   Resp 19   LMP 07/07/2014 (Approximate)   SpO2 100%  Physical Exam Vitals and nursing note reviewed.  HENT:     Head: Normocephalic and atraumatic.     Mouth/Throat:     Mouth: Mucous membranes are moist.  Eyes:     Conjunctiva/sclera:  Conjunctivae normal.  Cardiovascular:     Rate and Rhythm: Regular rhythm. Tachycardia present.     Pulses:          Dorsalis pedis pulses are 2+ on the right side and 2+ on the left side.       Posterior tibial pulses are 2+ on the right side and 2+ on the left side.     Heart sounds: Normal heart sounds.  Pulmonary:     Effort: Pulmonary effort is normal.     Breath sounds: Normal breath sounds.  Abdominal:     General: Abdomen is flat.     Palpations: Abdomen is soft.  Musculoskeletal:     Right foot: Swelling present. Normal pulse.     Left foot: Swelling present. Normal pulse.  Skin:    General: Skin is warm and dry.  Neurological:     General: No focal deficit present.  Psychiatric:        Mood and Affect: Mood normal.     ED Results / Procedures / Treatments   Labs (all labs ordered are listed, but only abnormal results are displayed) Labs Reviewed  COMPREHENSIVE METABOLIC PANEL - Abnormal; Notable for the following components:      Result Value   Potassium 3.0 (*)    Glucose, Bld 107 (*)    Albumin 3.4 (*)    Total Bilirubin 0.2 (*)    All other components within normal limits  CBC WITH DIFFERENTIAL/PLATELET - Abnormal; Notable for the following components:   Hemoglobin 10.7 (*)    HCT 34.5 (*)    Platelets 425 (*)    All other components within normal limits  D-DIMER, QUANTITATIVE - Abnormal; Notable for the following components:   D-Dimer, Quant 0.89 (*)    All other components within normal limits  BRAIN NATRIURETIC PEPTIDE  URINALYSIS, ROUTINE W REFLEX MICROSCOPIC    EKG None  Radiology US Venous Img Lower Bilateral (DVT)  Result Date: 07/12/2022 CLINICAL DATA:  Lower extremity swelling and pain for 3 weeks EXAM: BILATERAL LOWER EXTREMITY VENOUS DOPPLER ULTRASOUND TECHNIQUE: Gray-scale sonography with graded compression, as well as color Doppler and duplex ultrasound were performed to evaluate the lower extremity deep venous systems from the level of  the common femoral vein and including the common femoral, femoral, profunda femoral, popliteal and calf veins including the posterior tibial, peroneal and gastrocnemius veins when visible. Spectral Doppler was utilized to evaluate flow at rest and with distal augmentation maneuvers in the common femoral, femoral and popliteal veins. COMPARISON:  None Available. FINDINGS: RIGHT LOWER EXTREMITY Common Femoral Vein: No evidence of thrombus. Normal compressibility, respiratory phasicity and response to augmentation. Saphenofemoral Junction: No evidence of thrombus. Normal compressibility and flow on color Doppler imaging. Profunda Femoral Vein: No evidence of thrombus. Normal compressibility and flow on color Doppler imaging. Femoral Vein: No evidence  of thrombus. Normal compressibility, respiratory phasicity and response to augmentation. Popliteal Vein: No evidence of thrombus. Normal compressibility, respiratory phasicity and response to augmentation. Calf Veins: No evidence of thrombus. Normal compressibility and flow on color Doppler imaging. LEFT LOWER EXTREMITY Common Femoral Vein: No evidence of thrombus. Normal compressibility, respiratory phasicity and response to augmentation. Saphenofemoral Junction: No evidence of thrombus. Normal compressibility and flow on color Doppler imaging. Profunda Femoral Vein: No evidence of thrombus. Normal compressibility and flow on color Doppler imaging. Femoral Vein: No evidence of thrombus. Normal compressibility, respiratory phasicity and response to augmentation. Popliteal Vein: No evidence of thrombus. Normal compressibility, respiratory phasicity and response to augmentation. Calf Veins: No evidence of thrombus. Normal compressibility and flow on color Doppler imaging. IMPRESSION: No evidence of deep venous thrombosis in either lower extremity. Electronically Signed   By: Jerilynn Mages.  Shick M.D.   On: 07/12/2022 16:59   CT Angio Chest PE W/Cm &/Or Wo Cm  Result Date:  07/12/2022 CLINICAL DATA:  Shortness of breath.  Chest pain. EXAM: CT ANGIOGRAPHY CHEST WITH CONTRAST TECHNIQUE: Multidetector CT imaging of the chest was performed using the standard protocol during bolus administration of intravenous contrast. Multiplanar CT image reconstructions and MIPs were obtained to evaluate the vascular anatomy. RADIATION DOSE REDUCTION: This exam was performed according to the departmental dose-optimization program which includes automated exposure control, adjustment of the mA and/or kV according to patient size and/or use of iterative reconstruction technique. CONTRAST:  67m OMNIPAQUE IOHEXOL 350 MG/ML SOLN COMPARISON:  CT Angio 07/09/22 FINDINGS: Cardiovascular: Satisfactory opacification of the pulmonary arteries to the segmental level. No evidence of pulmonary embolism. Normal heart size. No pericardial effusion. Mediastinum/Nodes: No enlarged mediastinal, hilar, or axillary lymph nodes. Thyroid gland, trachea, and esophagus demonstrate no significant findings. Lungs/Pleura: Pleural effusion. No pneumothorax. Bibasilar atelectasis. Upper Abdomen: No acute abnormality. Musculoskeletal: Unchanged compression deformity at L1. Review of the MIP images confirms the above findings. IMPRESSION: No evidence of pulmonary embolism. Electronically Signed   By: HMarin RobertsM.D.   On: 07/12/2022 15:57   DG Chest 2 View  Result Date: 07/12/2022 CLINICAL DATA:  Shortness of breath. Discharged 3 weeks ago from hospital hernia surgery. Bilateral foot swelling and pain. EXAM: CHEST - 2 VIEW COMPARISON:  AP chest 06/07/2022, 06/01/2022; CT chest 06/09/2022 FINDINGS: Interval removal of tracheostomy tube. Cardiac silhouette is again mildly enlarged. Mediastinal contours are within limits. There is again hypertrophy of the left medial clavicular head, appearing to represent described remote trauma at the left sternoclavicular joint and first left costosternal joint on prior CT. Minimal bibasilar  linear bronchovascular crowding. No focal airspace opacity to indicate pneumonia on frontal view. There is mild opacification of the posteroinferior lungs on lateral view which may correspond to the bilateral posterior lower lobe atelectasis seen on 06/09/2022 CT. No acute skeletal abnormality. IMPRESSION: 1. Mild opacification of the posteroinferior lungs on lateral view which may correspond to the bilateral posterior lower lobe atelectasis seen on 06/09/2022 CT. It is difficult to exclude early pneumonia, however atelectasis was favored on prior CT. 2. Mild cardiomegaly. Electronically Signed   By: RYvonne KendallM.D.   On: 07/12/2022 11:47    Procedures Procedures  {Document cardiac monitor, telemetry assessment procedure when appropriate:1}  Medications Ordered in ED Medications  predniSONE (DELTASONE) tablet 50 mg (50 mg Oral Given 07/12/22 1713)  ketorolac (TORADOL) 30 MG/ML injection 30 mg (has no administration in time range)  HYDROcodone-acetaminophen (NORCO/VICODIN) 5-325 MG per tablet 1 tablet (1 tablet Oral Given 07/12/22 0928)  LORazepam (ATIVAN) injection 0.5 mg (0.5 mg Intravenous Given 07/12/22 1113)  diphenhydrAMINE (BENADRYL) capsule 50 mg (50 mg Oral Given 07/12/22 1116)  potassium chloride 10 mEq in 100 mL IVPB (0 mEq Intravenous Stopped 07/12/22 1703)  iohexol (OMNIPAQUE) 350 MG/ML injection 80 mL (80 mLs Intravenous Contrast Given 07/12/22 1534)    ED Course/ Medical Decision Making/ A&P                           Medical Decision Making Amount and/or Complexity of Data Reviewed Labs: ordered. Radiology: ordered.  Risk Prescription drug management.   Initial Impression and Ddx 51 year old female who is is a well-appearing, anxious but otherwise hemodynamically stable presenting for bilateral lower leg edema and pain.  Physical exam was notable for 1+ pitting edema around her ankles.  Differential diagnosis for this complaint includes CHF, renal failure, DVT, and  PE. Patient PMH that increases complexity of ED encounter: CHF, recent hospitalization for abdominal hernia repair, hypertension, diabetes, bipolar disorder  Interpretation of Diagnostics I independent reviewed and interpreted the labs as followed: Hypokalemia, elevated D-dimer anemia  - I independently visualized the following imaging with scope of interpretation limited to determining acute life threatening conditions related to emergency care: [place] bilateral lower extremities, which revealed no evidence of DVT.    Patient Reassessment and Ultimate Disposition/Management   Patient management required discussion with the following services or consulting groups:  {BEROCONSULT:26841}  Complexity of Problems Addressed {BEROCOPA:26833}  Additional Data Reviewed and Analyzed Further history obtained from: {BERODATA:26834}  Patient Encounter Risk Assessment {BERORISK:26838}   {Document critical care time when appropriate:1} {Document review of labs and clinical decision tools ie heart score, Chads2Vasc2 etc:1}  {Document your independent review of radiology images, and any outside records:1} {Document your discussion with family members, caretakers, and with consultants:1} {Document social determinants of health affecting pt's care:1} {Document your decision making why or why not admission, treatments were needed:1} Final Clinical Impression(s) / ED Diagnoses Final diagnoses:  Leg swelling    Rx / DC Orders ED Discharge Orders     None

## 2022-07-12 NOTE — Discharge Instructions (Addendum)
Return if any problems. See your Physician for recheck next week  

## 2022-07-12 NOTE — ED Provider Notes (Signed)
Patient is care assumed from [place] at 7 PM.  Patient is awaiting EKG, UA and urine drug screen.  Patient's EKG shows sinus tachycardia I reviewed previous EKGs patient has had previous sinus tachycardia.  UA is negative for infection urine drug screen is positive for opiates and THC.  I reevaluated patient I discussed her symptoms with her.  She reports that she has been having the pain in her legs for several weeks.  Patient is requesting pain medicine at this time.  Patient is advised of her ultrasound results and her CT results.  I suspect patient's symptoms are secondary to neuropathy.  Patient states that hydrocodone does not work for her.  I will try patient on tramadol.  Patient is advised that she needs to see her primary care physician to discuss ongoing pain management.  Patient is also given a prescription for potassium as her potassium was low today and this may be contributing to her cramping sensation.  It is stable for discharge and nursing will assist patient in getting transportation home   Sidney Ace 07/12/22 2228    Fransico Meadow, MD 07/14/22 [phone]

## 2022-07-12 NOTE — ED Notes (Signed)
This RN received report from [place]. Upon this RN's assessment patient is talking on phone and appears to be in no acute distress. This RN requested patient to call person back after assessment and EKG. She ends call. She begins speaking to this RN and becomes tearful and rates chronic bilateral foot pain 10/10. She currently denies chest pain. Pt is tachy cardiac 112-120. No jugular vein distention noted, breaths equal and unlabored. Speech is difficult to understand and she reports this has been ongoing since her hernia surgery approximately 2 months ago.  She denies new  sudden onset issues with speech or weakness. She reports she has been home 3 weeks from extended hospitalization s/p hernia surgery.  She c/o that her pain is not being treated, not even by her home hydrocodone. She reports she is out of her home pain medication as well. Bryson Corona Edd Fabian

## 2022-07-12 NOTE — ED Notes (Signed)
Convalescent  transport was called by Network engineer. DC papers reviewed with patient and family member over the phone. Pt aware scripts at pharmacy. Bryson Corona Edd Fabian

## 2022-07-12 NOTE — ED Triage Notes (Signed)
BIB EMS from home. Pt was recently in hospital and had hernia surgery, d/c 3 weeks ago. Has since had bilat foot swelling and pain. States has seen PCP and was put on meds for pain, but seems to no be helping now. States PCP said to elevate feet and give it a few days, but is not better

## 2022-07-13 MED ORDER — HYDROCODONE-ACETAMINOPHEN 5-325 MG PO TABS: 1.0000 | ORAL_TABLET | ORAL | Status: DC | PRN

## 2022-07-13 NOTE — ED Notes (Signed)
Still awaiting convalescent transport. Pt made aware of delay. No ETA was provided. Fresh water was provided to patient. Patricia [name]

## 2022-07-15 ENCOUNTER — Encounter: Payer: Self-pay | Admitting: Cardiology

## 2022-07-15 ENCOUNTER — Ambulatory Visit: Payer: Medicaid Other | Attending: Cardiology | Admitting: Cardiology

## 2022-07-15 ENCOUNTER — Telehealth: Payer: Self-pay | Admitting: Cardiology

## 2022-07-15 VITALS — BP 134/90 | HR 102 | Ht 64.0 in

## 2022-07-15 DIAGNOSIS — I10 Essential (primary) hypertension: Secondary | ICD-10-CM | POA: Diagnosis present

## 2022-07-15 DIAGNOSIS — Z993 Dependence on wheelchair: Secondary | ICD-10-CM | POA: Insufficient documentation

## 2022-07-15 DIAGNOSIS — I5042 Chronic combined systolic (congestive) and diastolic (congestive) heart failure: Secondary | ICD-10-CM | POA: Diagnosis not present

## 2022-07-15 DIAGNOSIS — I5181 Takotsubo syndrome: Secondary | ICD-10-CM | POA: Insufficient documentation

## 2022-07-15 DIAGNOSIS — R600 Localized edema: Secondary | ICD-10-CM | POA: Insufficient documentation

## 2022-07-15 DIAGNOSIS — E669 Obesity, unspecified: Secondary | ICD-10-CM | POA: Insufficient documentation

## 2022-07-15 DIAGNOSIS — E1169 Type 2 diabetes mellitus with other specified complication: Secondary | ICD-10-CM | POA: Diagnosis present

## 2022-07-15 DIAGNOSIS — G4733 Obstructive sleep apnea (adult) (pediatric): Secondary | ICD-10-CM | POA: Insufficient documentation

## 2022-07-15 MED ORDER — CARVEDILOL 6.25 MG PO TABS: 6.2500 mg | ORAL_TABLET | Freq: Two times a day (BID) | ORAL | 3 refills | Status: DC

## 2022-07-15 MED ORDER — AMLODIPINE BESYLATE 5 MG PO TABS: 5.0000 mg | ORAL_TABLET | Freq: Every day | ORAL | 3 refills | Status: DC

## 2022-07-15 MED ORDER — FUROSEMIDE 20 MG PO TABS: 20.0000 mg | ORAL_TABLET | Freq: Every day | ORAL | 3 refills | Status: DC
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
## 2022-07-15 NOTE — Telephone Encounter (Signed)
I contacted RN with home health, advised that they were needing more care at home after recent hospital visit. I did advise that her PCP could do these orders, for home health nursing/PT/OT or anything that was needed. They advised they would be in contact with the PCP.   Patient has appointment today with Dr.Harding 12/18

## 2022-07-15 NOTE — Telephone Encounter (Signed)
Patricia [name],Pt RN from [facility] care, calling because they need some paperwork for pt requesting additional assistance at home. Please advise. Pt is a np that will be seen today.

## 2022-07-15 NOTE — Patient Instructions (Signed)
Medication Instructions:   Stop  taking HCTZ  Start Lasix  20 mg  daily  - (first 1st and 2nd day  take 2 tablets)    Decrease Amlodipine 5 mg daily    Start taking Carvedilol 6.25 mg  twice a day    *If you need a refill on your cardiac medications before your next appointment, please call your pharmacy*   Lab Work: Not needed If you have labs (blood work) drawn today and your tests are completely normal, you will receive your results only by: [place] (if you have MyChart) OR A paper copy in the mail If you have any lab test that is abnormal or we need to change your treatment, we will call you to review the results.   Testing/Procedures: Not needed   Follow-Up: At [facility], you and your health needs are our priority.  As part of our continuing mission to provide you with exceptional heart care, we have created designated Provider Care Teams.  These Care Teams include your primary Cardiologist (physician) and Advanced Practice Providers (APPs -  Physician Assistants and Nurse Practitioners) who all work together to provide you with the care you need, when you need it.     Your next appointment:    3 to 4 week(s)  The format for your next appointment:   In Person  Provider:   Sande Rives, PA-C, Caron Presume, PA-C, Jory Sims, DNP, ANP, or Diona Browner, NP    Then, Glenetta Hew, MD will plan to see you again in  2 to 3 month(s).   Other Instructions   Elevated  feet at least 20- 30 minutes  three times a day   ( Try a purchase the [facility])   Purchase  support hose  20 -30 mmHG   Knee hi     recommends you purchase some compression  socks/hose from [facility] in [place] You do not need an prescription to purchase the items.  Address  [address] [place], [place] [address]  Phone  [phone]   Compression   strength    8-15 mmHg 15-20 mmHg                          X 20-30 mmHg  30-40 mmHg.  You may also try  a medical supply store, department store (i.e.- [place], [employer], [employer], specialty shoe stores ( shoe market), [employer] and [employer]) or  Chartered loss adjuster uniform store.

## 2022-07-16 ENCOUNTER — Telehealth: Payer: Self-pay

## 2022-07-16 NOTE — Telephone Encounter (Signed)
Transition Care Management Follow-up Telephone Call Date of discharge and from where: 07/13/22 [facility] ED. Dx: Leg swelling. How have you been since you were released from the hospital? I'm still hurting. Any questions or concerns? No   Items Reviewed: Did the pt receive and understand the discharge instructions provided? Yes  Medications obtained and verified? Yes  Other? No  Any new allergies since your discharge? No  Dietary orders reviewed? No Do you have support at home? Yes   Home Care and Equipment/Supplies: Were home health services ordered? not applicable If so, what is the name of the agency? N/a  Has the agency set up a time to come to the patient's home? not applicable Were any new equipment or medical supplies ordered?  No What is the name of the medical supply agency? N/a Were you able to get the supplies/equipment? not applicable Do you have any questions related to the use of the equipment or supplies? No  Functional Questionnaire: (I = Independent and D = Dependent) ADLs: D  Bathing/Dressing- D  Meal Prep- D  Eating- D  Maintaining continence- D  Transferring/Ambulation- D  Managing Meds- D  Follow up appointments reviewed:  PCP Hospital f/u appt confirmed? Yes  Scheduled to see Dr. Grandville Silos on 07/26/22 @ 1:40pm. [facility] f/u appt confirmed? Yes  Scheduled to see Dr. Marilynn Rail on 08/05/22 @ 3:35pm. Are transportation arrangements needed? No  If their condition worsens, is the pt aware to call PCP or go to the Emergency Dept.? Yes Was the patient provided with contact information for the PCP's office or ED? Yes Was to pt encouraged to call back with questions or concerns? Yes  RN Clinical Supervisor [facility] [employer]

## 2022-07-20 ENCOUNTER — Encounter: Payer: Self-pay | Admitting: Cardiology

## 2022-07-20 NOTE — Assessment & Plan Note (Signed)
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  Needs physical therapy and nutrition augmentation.  Muscle atrophy and neuropathy are exacerbating her edema.

## 2022-07-20 NOTE — Assessment & Plan Note (Signed)
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  Plan: Reduce amlodipine dose to 5 mg to allow for carvedilol initiation. Convert from HCTZ to Lasix 20 mg daily (can take 1 additional tablet if necessary for weight gain more than 3 pounds) Reviewed [facility] agreed [facility] => foot elevation 20 to 30 min 3 times a day plus nighttime 20-30 mmHg knee-high support stockings

## 2022-07-20 NOTE — Assessment & Plan Note (Signed)
Blood pressure is borderline today with elevated diastolic pressures.  Needs some afterload reduction and diuretic.  Plan: Convert from HCTZ to Lasix 20 mg, reduce amlodipine to 5 mg and start carvedilol 6.25 mg twice daily.  As renal function remains stable, anticipate adding ARB which may allow [place] to fully discontinue amlodipine.

## 2022-07-20 NOTE — Assessment & Plan Note (Addendum)
Echo prior to discharge showed essentially low normal EF (improved from bedside echo EF of 15% to final EF of 50 to 55%) with otherwise normal numbers.  Suspect this was related to critical illness from aspiration pneumonia and sepsis complicating postop ileus.   She had return of cardiac function within 2 weeks, however required additional 5 weeks in the hospital prior to discharge and was lost to cardiac follow-up.  Will need medication titration.

## 2022-07-20 NOTE — Assessment & Plan Note (Addendum)
She probably does have some diastolic dysfunction.  It is still tachycardic.  Denies any real significant PND or orthopnea.  She does have edema but is sedentary, sitting in wheelchair with peripheral neuropathy that probably means a lot of it is venous stasis related.  Her most recent echocardiogram showed resolved cardiomyopathy as far as reduced EF, but probably clearly has diastolic dysfunction as residual effect.  Was not discharged on any medications except for HCTZ and amlodipine..  Technically her diagnosis would be chronic diastolic heart at this point, but recently had systolic heart failure as well in the setting of stress-induced cardiomyopathy.  Plan: Medication adjustments made, will plan for close 3 to 4-week follow-up with either myself or APP.  I will then see her back 2 to 3 months after that. Stop  taking HCTZ Start Lasix  20 mg  daily  - (first 1st and 2nd day  take 2 tablets)  Will need chemistry panel checked when she comes for follow-up. Decrease Amlodipine 5 mg daily (to allow addition of GDMT medications, and to decrease swelling side effect) May additionally discontinue amlodipine and add ARB in follow-up. Start taking Carvedilol 6.25 mg  twice a day Compression stockings 20-30 mmHg knee-high support stockings) foot elevation (elevate feet at least 20 - 30 minutes 3 times a day plus nighttime)

## 2022-07-20 NOTE — Assessment & Plan Note (Signed)
Will need to be assessed in follow-up.  Defer to PCP => may need reevaluation.  I suspect that she should have pulmonary critical care follow-up after her prolonged hospitalization.  This can probably be addressed at that point.

## 2022-07-24 ENCOUNTER — Telehealth: Payer: Self-pay

## 2022-07-24 ENCOUNTER — Telehealth: Payer: Self-pay | Admitting: Family Medicine

## 2022-07-24 NOTE — Telephone Encounter (Signed)
FYI:Pt called in needing to cancel her hosp f/up scheduled for 07/26/22 with Dr Grandville Silos. I was setting up transportation for her, her insurance company said she did not call in soon enough to line up transportation for this appointment. I realized she had Medicaid [place] Access which we are not contracted with. I called [facility]. I was able to do a three way call with pt and [employer]. She has an appointment scheduled with their office and she Is transferring her care to them. I cancelled her appointment for 07/26/22.

## 2022-07-24 NOTE — Telephone Encounter (Signed)
Faxed over FL2 form from media to [facility] at [phone].

## 2022-07-24 NOTE — Therapy (Deleted)
SLP Cancellation Note:  This MBSS was ordered in error and Pt was not seen for MBSS.  Thank you,  Genene Churn, CCC-SLP [phone]

## 2022-07-24 NOTE — Telephone Encounter (Signed)
-----   Message from [name], MD sent at 07/17/2022  5:03 PM EST ----- I had an FL2 form on my desk. But it looks I completed one and it was faxed on 07/03/22. I put the blank form on your desk. Can you please find out where the issue lies? I appreciate the help.

## 2022-07-24 NOTE — Therapy (Signed)
SLP Cancellation Note  Patient Details Name: WILMARIE SPARLIN MRN: [id number] DOB: [birth year]   Cancelled treatment:        Pt was not seen for MBSS. The appointment was scheduled in error.  Thank you,  Genene Churn, [place]    [facility] 07/24/2022, 2:53 PM  [place] [address] [place], [place], [address] Phone: [phone]   Fax:  [phone]  Patient Details  Name: TALIYAH WATROUS MRN: [id number] Date of Birth: 09/23/1970 Referring Provider:  [name], MD  Encounter Date: 07/11/2022   Teddy Spike 07/24/2022, 2:53 PM  [place] [address] [place], [place], [address] Phone: [phone]   Fax:  [phone]

## 2022-07-26 ENCOUNTER — Inpatient Hospital Stay: Payer: Medicaid Other | Admitting: Family Medicine

## 2022-07-30 ENCOUNTER — Inpatient Hospital Stay: Payer: Self-pay | Admitting: Internal Medicine
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
## 2022-08-05 ENCOUNTER — Ambulatory Visit: Payer: Medicaid Other | Attending: General Practice | Admitting: General Practice

## 2022-08-08 ENCOUNTER — Inpatient Hospital Stay: Payer: Medicaid Other | Admitting: Critical Care Medicine
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
## 2022-08-13 ENCOUNTER — Telehealth: Payer: Self-pay | Admitting: Family Medicine

## 2022-08-13 NOTE — Telephone Encounter (Signed)
Patricia [name] from [facility] [phone] called and want to order speech therapy for 2 week for 3 weeks and judy stated that it can be a verbal order

## 2022-08-14 ENCOUNTER — Ambulatory Visit: Payer: Medicaid Other | Admitting: Family

## 2022-08-14 ENCOUNTER — Encounter: Payer: Self-pay | Admitting: Family

## 2022-08-14 VITALS — BP 142/78 | HR 78 | Temp 97.8°F | Resp 18 | Ht 64.0 in | Wt 211.0 lb

## 2022-08-14 DIAGNOSIS — F2081 Schizophreniform disorder: Secondary | ICD-10-CM

## 2022-08-14 DIAGNOSIS — I10 Essential (primary) hypertension: Secondary | ICD-10-CM | POA: Diagnosis not present

## 2022-08-14 DIAGNOSIS — Z7689 Persons encountering health services in other specified circumstances: Secondary | ICD-10-CM

## 2022-08-14 DIAGNOSIS — I5032 Chronic diastolic (congestive) heart failure: Secondary | ICD-10-CM | POA: Diagnosis not present

## 2022-08-14 DIAGNOSIS — G4700 Insomnia, unspecified: Secondary | ICD-10-CM

## 2022-08-14 DIAGNOSIS — E1169 Type 2 diabetes mellitus with other specified complication: Secondary | ICD-10-CM

## 2022-08-14 DIAGNOSIS — E785 Hyperlipidemia, unspecified: Secondary | ICD-10-CM

## 2022-08-14 DIAGNOSIS — E1140 Type 2 diabetes mellitus with diabetic neuropathy, unspecified: Secondary | ICD-10-CM

## 2022-08-14 DIAGNOSIS — F3170 Bipolar disorder, currently in remission, most recent episode unspecified: Secondary | ICD-10-CM

## 2022-08-14 DIAGNOSIS — G4733 Obstructive sleep apnea (adult) (pediatric): Secondary | ICD-10-CM

## 2022-08-14 DIAGNOSIS — Z993 Dependence on wheelchair: Secondary | ICD-10-CM

## 2022-08-14 DIAGNOSIS — E669 Obesity, unspecified: Secondary | ICD-10-CM

## 2022-08-14 DIAGNOSIS — R1312 Dysphagia, oropharyngeal phase: Secondary | ICD-10-CM

## 2022-08-14 MED ORDER — TRAZODONE HCL 100 MG PO TABS: 100.0000 mg | ORAL_TABLET | Freq: Every day | ORAL | 3 refills | Status: DC

## 2022-08-14 MED ORDER — GABAPENTIN 100 MG PO CAPS: 100.0000 mg | ORAL_CAPSULE | Freq: Three times a day (TID) | ORAL | 3 refills | Status: DC

## 2022-08-14 MED ORDER — CYCLOBENZAPRINE HCL 10 MG PO TABS: 10.0000 mg | ORAL_TABLET | Freq: Three times a day (TID) | ORAL | 3 refills | Status: DC | PRN

## 2022-08-14 MED ORDER — TRAMADOL HCL 50 MG PO TABS: 50.0000 mg | ORAL_TABLET | Freq: Four times a day (QID) | ORAL | 0 refills | Status: AC | PRN

## 2022-08-14 MED ORDER — HYDROXYZINE HCL 25 MG PO TABS: 25.0000 mg | ORAL_TABLET | Freq: Three times a day (TID) | ORAL | 0 refills | Status: DC | PRN

## 2022-08-14 MED ORDER — AMLODIPINE BESYLATE 5 MG PO TABS: 5.0000 mg | ORAL_TABLET | Freq: Every day | ORAL | 3 refills | Status: AC

## 2022-08-14 MED ORDER — FUROSEMIDE 20 MG PO TABS: 20.0000 mg | ORAL_TABLET | Freq: Every day | ORAL | 3 refills | Status: AC

## 2022-08-14 MED ORDER — CARVEDILOL 6.25 MG PO TABS: 6.2500 mg | ORAL_TABLET | Freq: Two times a day (BID) | ORAL | 3 refills | Status: AC
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
## 2022-08-14 NOTE — Telephone Encounter (Signed)
Left Patricia [name] a detailed message regarding annotation below on her secure voicemail.

## 2022-08-15 ENCOUNTER — Telehealth: Payer: Self-pay | Admitting: *Deleted

## 2022-08-15 NOTE — Telephone Encounter (Signed)
Received Prior Authorization from CoverMyMeds for patient's Tramadol.  Initiated PA through CoverMyMeds NOI:[id number] PA Case ID: [id number] CM#:[id number]  Prior Authorization APPROVED Patient notified.

## 2022-08-19 ENCOUNTER — Institutional Professional Consult (permissible substitution): Payer: Medicaid Other | Admitting: Primary Care

## 2022-08-20 ENCOUNTER — Other Ambulatory Visit: Payer: Medicaid Other

## 2022-08-22 ENCOUNTER — Other Ambulatory Visit: Payer: Medicaid Other

## 2022-08-22 DIAGNOSIS — E1169 Type 2 diabetes mellitus with other specified complication: Secondary | ICD-10-CM

## 2022-08-22 DIAGNOSIS — I10 Essential (primary) hypertension: Secondary | ICD-10-CM

## 2022-08-22 DIAGNOSIS — E785 Hyperlipidemia, unspecified: Secondary | ICD-10-CM

## 2022-08-22 DIAGNOSIS — E119 Type 2 diabetes mellitus without complications: Secondary | ICD-10-CM

## 2022-08-22 DIAGNOSIS — F3170 Bipolar disorder, currently in remission, most recent episode unspecified: Secondary | ICD-10-CM

## 2022-08-23 LAB — CBC WITH DIFFERENTIAL/PLATELET
Absolute Monocytes: 270 cells/uL (ref 200–950)
Basophils Absolute: 30 cells/uL (ref 0–200)
Basophils Relative: 0.6 %
Eosinophils Absolute: 70 cells/uL (ref 15–500)
Eosinophils Relative: 1.4 %
HCT: 36 % (ref 35.0–45.0)
Hemoglobin: 11.4 g/dL — ABNORMAL LOW (ref 11.7–15.5)
Lymphs Abs: 2160 cells/uL (ref 850–3900)
MCH: 25.8 pg — ABNORMAL LOW (ref 27.0–33.0)
MCHC: 31.7 g/dL — ABNORMAL LOW (ref 32.0–36.0)
MCV: 81.4 fL (ref 80.0–100.0)
MPV: 9.8 fL (ref 7.5–12.5)
Monocytes Relative: 5.4 %
Neutro Abs: 2470 cells/uL (ref 1500–7800)
Neutrophils Relative %: 49.4 %
Platelets: 416 10*3/uL — ABNORMAL HIGH (ref 140–400)
RBC: 4.42 10*6/uL (ref 3.80–5.10)
RDW: 14.1 % (ref 11.0–15.0)
Total Lymphocyte: 43.2 %
WBC: 5 10*3/uL (ref 3.8–10.8)

## 2022-08-23 LAB — COMPLETE METABOLIC PANEL WITH GFR
AG Ratio: 1.3 (calc) (ref 1.0–2.5)
ALT: 10 U/L (ref 6–29)
AST: 14 U/L (ref 10–35)
Albumin: 4.4 g/dL (ref 3.6–5.1)
Alkaline phosphatase (APISO): 41 U/L (ref 37–153)
BUN: 10 mg/dL (ref 7–25)
CO2: 25 mmol/L (ref 20–32)
Calcium: 10.4 mg/dL (ref 8.6–10.4)
Chloride: 105 mmol/L (ref 98–110)
Creat: 1.03 mg/dL (ref 0.50–1.03)
Globulin: 3.4 g/dL (calc) (ref 1.9–3.7)
Glucose, Bld: 95 mg/dL (ref 65–99)
Potassium: 4 mmol/L (ref 3.5–5.3)
Sodium: 140 mmol/L (ref 135–146)
Total Bilirubin: 0.3 mg/dL (ref 0.2–1.2)
Total Protein: 7.8 g/dL (ref 6.1–8.1)
eGFR: 65 mL/min/{1.73_m2} (ref >=60)

## 2022-08-23 LAB — MICROALBUMIN / CREATININE URINE RATIO
Creatinine, Urine: 13 mg/dL — ABNORMAL LOW (ref 20–275)
Microalb Creat Ratio: 38 mcg/mg creat — ABNORMAL HIGH (ref <30)
Microalb, Ur: 0.5 mg/dL

## 2022-08-23 LAB — HEMOGLOBIN A1C
Hgb A1c MFr Bld: 5.6 % of total Hgb (ref <5.7)
Mean Plasma Glucose: 114 mg/dL
eAG (mmol/L): 6.3 mmol/L

## 2022-08-23 LAB — LIPID PANEL
Cholesterol: 243 mg/dL — ABNORMAL HIGH (ref <200)
HDL: 56 mg/dL (ref >=50)
LDL Cholesterol (Calc): 166 mg/dL (calc) — ABNORMAL HIGH
Non-HDL Cholesterol (Calc): 187 mg/dL (calc) — ABNORMAL HIGH (ref <130)
Total CHOL/HDL Ratio: 4.3 (calc) (ref <5.0)
Triglycerides: 100 mg/dL (ref <150)

## 2022-08-23 LAB — TSH: TSH: 0.72 mIU/L

## 2022-08-27 ENCOUNTER — Telehealth: Payer: Self-pay | Admitting: *Deleted

## 2022-08-27 DIAGNOSIS — Z993 Dependence on wheelchair: Secondary | ICD-10-CM

## 2022-08-27 DIAGNOSIS — I5032 Chronic diastolic (congestive) heart failure: Secondary | ICD-10-CM

## 2022-08-27 NOTE — Telephone Encounter (Signed)
Patricia [name] with [facility] called and stated that patient is requesting a Rx/Order for a Shower Chair to be faxed to [place] [phone] to be Delivered.   Please Advise.

## 2022-08-27 NOTE — Telephone Encounter (Signed)
Shower chair ordered.please fax as requested to [facility] to be Delivered.

## 2022-08-27 NOTE — Telephone Encounter (Signed)
Order faxed to [facility] as requested.

## 2022-08-28 ENCOUNTER — Institutional Professional Consult (permissible substitution): Payer: Medicaid Other | Admitting: Primary Care

## 2022-08-29 NOTE — Telephone Encounter (Signed)
Patricia [name] from [facility] is needing to get verbal order's for 2w/1x for speech therapy. You can leave a vm at [phone] on her secure line. The pt wasn't feeling well last week and missed her appointment.

## 2022-08-30 NOTE — Telephone Encounter (Signed)
Left patient a detailed voice message regarding annotation below.  

## 2022-09-10 ENCOUNTER — Ambulatory Visit: Payer: Medicaid Other | Admitting: Cardiology
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
## 2022-09-16 ENCOUNTER — Encounter: Payer: Self-pay | Admitting: General Practice

## 2022-09-16 ENCOUNTER — Other Ambulatory Visit: Payer: Self-pay

## 2022-09-16 ENCOUNTER — Ambulatory Visit: Payer: Medicaid Other | Attending: Cardiology | Admitting: General Practice

## 2022-09-16 VITALS — BP 130/86 | HR 87 | Ht 64.0 in | Wt 216.0 lb

## 2022-09-16 DIAGNOSIS — G4733 Obstructive sleep apnea (adult) (pediatric): Secondary | ICD-10-CM

## 2022-09-16 DIAGNOSIS — E669 Obesity, unspecified: Secondary | ICD-10-CM | POA: Diagnosis present

## 2022-09-16 DIAGNOSIS — I5181 Takotsubo syndrome: Secondary | ICD-10-CM | POA: Insufficient documentation

## 2022-09-16 DIAGNOSIS — E1169 Type 2 diabetes mellitus with other specified complication: Secondary | ICD-10-CM

## 2022-09-16 DIAGNOSIS — R600 Localized edema: Secondary | ICD-10-CM | POA: Insufficient documentation

## 2022-09-16 DIAGNOSIS — I10 Essential (primary) hypertension: Secondary | ICD-10-CM | POA: Insufficient documentation

## 2022-09-16 MED ORDER — ATORVASTATIN CALCIUM 10 MG PO TABS: 10.0000 mg | ORAL_TABLET | Freq: Every day | ORAL | 0 refills | Status: AC

## 2022-09-16 NOTE — Patient Instructions (Signed)
Medication Instructions:  The current medical regimen is effective;  continue present plan and medications as directed. Please refer to the Current Medication list given to you today. *If you need a refill on your cardiac medications before your next appointment, please call your pharmacy*  Lab Work: BMET TODAY If you have labs (blood work) drawn today and your tests are completely normal, you will receive your results only by: [facility] (if you have MyChart) OR A paper copy in the mail If you have any lab test that is abnormal or we need to change your treatment, we will call you to review the results.  Testing/Procedures: NONE  Other Instructions PLEASE READ AND FOLLOW ATTACHED CHAIR EXERCISES  SLOWLY INCREASE WALKING  TAKE AND LOG YOUR WEIGHT  PLEASE READ AND FOLLOW ATTACHED  SALTY 6   Follow-Up: At [facility], you and your health needs are our priority.  As part of our continuing mission to provide you with exceptional heart care, we have created designated Provider Care Teams.  These Care Teams include your primary Cardiologist (physician) and Advanced Practice Providers (APPs -  Physician Assistants and Nurse Practitioners) who all work together to provide you with the care you need, when you need it.    Your next appointment:   6-9 month(s)  Provider:   Glenetta Hew, MD

## 2022-09-17 LAB — BASIC METABOLIC PANEL
BUN/Creatinine Ratio: 11 (ref 9–23)
BUN: 12 mg/dL (ref 6–24)
CO2: 17 mmol/L — ABNORMAL LOW (ref 20–29)
Calcium: 10.6 mg/dL — ABNORMAL HIGH (ref 8.7–10.2)
Chloride: 107 mmol/L — ABNORMAL HIGH (ref 96–106)
Creatinine, Ser: 1.06 mg/dL — ABNORMAL HIGH (ref 0.57–1.00)
Glucose: 93 mg/dL (ref 70–99)
Potassium: 4.2 mmol/L (ref 3.5–5.2)
Sodium: 147 mmol/L — ABNORMAL HIGH (ref 134–144)
eGFR: 63 mL/min/{1.73_m2} (ref >59)

## 2022-09-18 ENCOUNTER — Other Ambulatory Visit: Payer: Self-pay | Admitting: Family

## 2022-09-18 ENCOUNTER — Other Ambulatory Visit: Payer: Self-pay

## 2022-09-18 DIAGNOSIS — R600 Localized edema: Secondary | ICD-10-CM

## 2022-09-18 DIAGNOSIS — E1140 Type 2 diabetes mellitus with diabetic neuropathy, unspecified: Secondary | ICD-10-CM

## 2022-09-18 DIAGNOSIS — I10 Essential (primary) hypertension: Secondary | ICD-10-CM

## 2022-09-18 DIAGNOSIS — F3170 Bipolar disorder, currently in remission, most recent episode unspecified: Secondary | ICD-10-CM

## 2022-09-18 NOTE — Telephone Encounter (Signed)
Tramadol is not on active medication list and patient would need an appointment to reinstate

## 2022-09-19 ENCOUNTER — Other Ambulatory Visit: Payer: Self-pay | Admitting: Family

## 2022-09-19 DIAGNOSIS — E1140 Type 2 diabetes mellitus with diabetic neuropathy, unspecified: Secondary | ICD-10-CM

## 2022-09-20 ENCOUNTER — Other Ambulatory Visit: Payer: Self-pay

## 2022-10-07 ENCOUNTER — Telehealth (INDEPENDENT_AMBULATORY_CARE_PROVIDER_SITE_OTHER): Payer: Medicaid Other | Admitting: Nurse Practitioner

## 2022-10-07 ENCOUNTER — Encounter: Payer: Self-pay | Admitting: Nurse Practitioner

## 2022-10-07 ENCOUNTER — Telehealth: Payer: Self-pay

## 2022-10-07 DIAGNOSIS — H1132 Conjunctival hemorrhage, left eye: Secondary | ICD-10-CM
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
## 2022-10-07 NOTE — Progress Notes (Signed)
   This service is provided via telemedicine  No vital signs collected/recorded due to the encounter was a telemedicine visit.   Location of patient (ex: home, work):  Home  Patient consents to a telephone visit: Yes, see telephone visit dated 10/07/22  Location of the provider (ex: office, home):  [facility], Office   Name of any referring provider:  N/A  Names of all persons participating in the telemedicine service and their role in the encounter:  S.Chrae B/CMA, Sherrie Mustache, NP, and Patient   Time spent on call:  7 min with medical assistant

## 2022-10-07 NOTE — Telephone Encounter (Signed)
Patricia [name] with [facility] , Speech therapy called requesting verbal orders for speech therapy 1 time a week for 5 weeks.  Verbal orders given.  Message routed to [employer], np

## 2022-10-21 ENCOUNTER — Other Ambulatory Visit: Payer: Self-pay | Admitting: Family

## 2022-10-21 DIAGNOSIS — F3170 Bipolar disorder, currently in remission, most recent episode unspecified: Secondary | ICD-10-CM

## 2022-10-28 ENCOUNTER — Encounter: Payer: Medicaid Other | Admitting: Family

## 2022-10-28 NOTE — Progress Notes (Signed)
  This encounter was created in error - please disregard. No show 

## 2022-11-06 ENCOUNTER — Telehealth: Payer: Self-pay

## 2022-11-06 NOTE — Telephone Encounter (Signed)
Sharl Ma with [facility] called to state they contacted patient for start of care date and patient requested that care begins on Tuesday 11/12/2022

## 2022-11-06 NOTE — Telephone Encounter (Signed)
Message was left on clinical intake voicemail yesterday (after 4:30 pm) stating patient has moved to [facility] from [place] and a verbal order is requested to have patient evaluated by the [facility] team for physical therapy and speech therapy.  I returned call to [facility] and gave the verbal order.

## 2022-11-09 ENCOUNTER — Other Ambulatory Visit: Payer: Self-pay | Admitting: Family Medicine

## 2022-11-09 DIAGNOSIS — I10 Essential (primary) hypertension: Secondary | ICD-10-CM

## 2022-11-09 DIAGNOSIS — G4700 Insomnia, unspecified: Secondary | ICD-10-CM

## 2022-11-12 ENCOUNTER — Telehealth: Payer: Self-pay

## 2022-11-12 NOTE — Telephone Encounter (Signed)
Vonna Kotyk PT from [facility] called and needed verbal orders for patient to have PT once a week for one week, twice a week for two weeks, and once a week for three weeks. He also request for social worker to come once a week for one week. Verbal orders given. Message sent to provider as FYI. No further action is required at this time.

## 2022-11-15 ENCOUNTER — Telehealth: Payer: Self-pay

## 2022-11-15 NOTE — Telephone Encounter (Signed)
Please schedule follow up appointment to evaluate for fall

## 2022-11-15 NOTE — Telephone Encounter (Signed)
Olegario Messier with [facility] called requesting verbal orders for speech therapy 1 time a week for 5 weeks. VERBAL ORDERS WERE GIVEN.  She also stated that the patient request to have a head DT done. She believes she had a stroke during the Fall season and the hospital didn't do any head scans.  Message sent to Richarda Blade, NP

## 2022-11-15 NOTE — Telephone Encounter (Signed)
Patient is scheduled for appointment 4/26 with Kenard Gower, NP

## 2022-11-15 NOTE — Telephone Encounter (Signed)
Patient did not have a fall

## 2022-11-15 NOTE — Telephone Encounter (Signed)
Noted  

## 2022-11-22 ENCOUNTER — Ambulatory Visit (INDEPENDENT_AMBULATORY_CARE_PROVIDER_SITE_OTHER): Payer: 59 | Admitting: Adult Health

## 2022-11-22 ENCOUNTER — Encounter: Payer: Self-pay | Admitting: Adult Health

## 2022-11-22 VITALS — BP 128/88 | HR 93 | Temp 97.2°F | Resp 17 | Ht 64.0 in | Wt 222.6 lb

## 2022-11-22 DIAGNOSIS — L299 Pruritus, unspecified: Secondary | ICD-10-CM

## 2022-11-22 DIAGNOSIS — F3170 Bipolar disorder, currently in remission, most recent episode unspecified: Secondary | ICD-10-CM

## 2022-11-22 DIAGNOSIS — R531 Weakness: Secondary | ICD-10-CM | POA: Diagnosis not present

## 2022-11-22 DIAGNOSIS — R471 Dysarthria and anarthria: Secondary | ICD-10-CM

## 2022-11-22 DIAGNOSIS — E1140 Type 2 diabetes mellitus with diabetic neuropathy, unspecified: Secondary | ICD-10-CM

## 2022-11-22 DIAGNOSIS — I10 Essential (primary) hypertension: Secondary | ICD-10-CM

## 2022-11-22 MED ORDER — GABAPENTIN 100 MG PO CAPS: 200.0000 mg | ORAL_CAPSULE | Freq: Two times a day (BID) | ORAL | 3 refills | Status: AC

## 2022-11-22 MED ORDER — HYDROXYZINE HCL 25 MG PO TABS: ORAL_TABLET | ORAL | 0 refills | Status: DC
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
## 2022-11-27 ENCOUNTER — Telehealth: Payer: Self-pay

## 2022-11-27 NOTE — Telephone Encounter (Signed)
Patient called requesting referral be places to eye doctor and dentist in [place].  Message routed to Richarda Blade, NP

## 2022-11-27 NOTE — Telephone Encounter (Signed)
Patient may schedule appointment with dental office no referral required. Do you have any Ophthalmology in [place] that need referral sent to or any.

## 2022-12-02 NOTE — Addendum Note (Signed)
Addended by: Kenard Gower C on: 12/02/2022 11:00 AM   Modules accepted: Orders

## 2022-12-03 DIAGNOSIS — R519 Headache, unspecified: Secondary | ICD-10-CM | POA: Diagnosis not present

## 2022-12-03 DIAGNOSIS — R531 Weakness: Secondary | ICD-10-CM | POA: Diagnosis not present

## 2022-12-04 DIAGNOSIS — R32 Unspecified urinary incontinence: Secondary | ICD-10-CM | POA: Diagnosis not present

## 2022-12-18 DIAGNOSIS — I6529 Occlusion and stenosis of unspecified carotid artery: Secondary | ICD-10-CM | POA: Diagnosis not present

## 2022-12-18 DIAGNOSIS — G43109 Migraine with aura, not intractable, without status migrainosus: Secondary | ICD-10-CM | POA: Diagnosis not present

## 2022-12-18 DIAGNOSIS — H18591 Other hereditary corneal dystrophies, right eye: Secondary | ICD-10-CM | POA: Diagnosis not present

## 2022-12-18 DIAGNOSIS — H35363 Drusen (degenerative) of macula, bilateral: Secondary | ICD-10-CM | POA: Diagnosis not present

## 2022-12-18 DIAGNOSIS — H2513 Age-related nuclear cataract, bilateral: Secondary | ICD-10-CM | POA: Diagnosis not present

## 2022-12-20 ENCOUNTER — Ambulatory Visit (INDEPENDENT_AMBULATORY_CARE_PROVIDER_SITE_OTHER): Payer: 59 | Admitting: Family

## 2022-12-20 ENCOUNTER — Encounter: Payer: Self-pay | Admitting: Family

## 2022-12-20 VITALS — BP 130/86 | HR 91 | Temp 98.6°F | Resp 16 | Ht 64.0 in | Wt 236.0 lb

## 2022-12-20 DIAGNOSIS — J209 Acute bronchitis, unspecified: Secondary | ICD-10-CM | POA: Diagnosis not present

## 2022-12-20 MED ORDER — GUAIFENESIN-DM 100-10 MG/5ML PO SYRP: 5.0000 mL | ORAL_SOLUTION | ORAL | 0 refills | Status: AC | PRN

## 2022-12-20 MED ORDER — PREDNISONE 20 MG PO TABS: ORAL_TABLET | ORAL | 0 refills | Status: AC

## 2022-12-20 MED ORDER — DOXYCYCLINE HYCLATE 100 MG PO TABS: 100.0000 mg | ORAL_TABLET | Freq: Two times a day (BID) | ORAL | 0 refills | Status: AC

## 2022-12-20 NOTE — Patient Instructions (Signed)
-   Notify provider if symptoms worsen or fail to improve  - Avoid smoking cigarettes while sick
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
## 2022-12-22 ENCOUNTER — Other Ambulatory Visit: Payer: Self-pay | Admitting: Adult Health

## 2022-12-22 DIAGNOSIS — F3170 Bipolar disorder, currently in remission, most recent episode unspecified: Secondary | ICD-10-CM

## 2022-12-22 DIAGNOSIS — L299 Pruritus, unspecified: Secondary | ICD-10-CM

## 2022-12-22 DIAGNOSIS — R531 Weakness: Secondary | ICD-10-CM

## 2022-12-24 NOTE — Telephone Encounter (Signed)
Patient has request refill on medication Hydroxyzine. Medication has warnings. Medication pend and sent to PCP Ngetich, Donalee Citrin, NP

## 2022-12-26 ENCOUNTER — Ambulatory Visit
Admission: RE | Admit: 2022-12-26 | Discharge: 2022-12-26 | Disposition: A | Discharge status: Home / Self Care | Payer: Medicaid Other | Source: Ambulatory Visit | Attending: Adult Health | Admitting: Adult Health

## 2022-12-26 DIAGNOSIS — R531 Weakness: Secondary | ICD-10-CM | POA: Diagnosis not present

## 2022-12-26 DIAGNOSIS — R4781 Slurred speech: Secondary | ICD-10-CM | POA: Diagnosis not present

## 2022-12-27 NOTE — Progress Notes (Signed)
Negative for acute intracranial abnormality.

## 2022-12-30 ENCOUNTER — Other Ambulatory Visit: Payer: Self-pay | Admitting: Family

## 2022-12-30 DIAGNOSIS — G43009 Migraine without aura, not intractable, without status migrainosus: Secondary | ICD-10-CM

## 2022-12-30 DIAGNOSIS — G4700 Insomnia, unspecified: Secondary | ICD-10-CM

## 2022-12-30 NOTE — Progress Notes (Signed)
Neurologist referral ordered as requested.

## 2023-01-01 DIAGNOSIS — Z20822 Contact with and (suspected) exposure to covid-19: Secondary | ICD-10-CM | POA: Diagnosis not present

## 2023-01-01 DIAGNOSIS — R252 Cramp and spasm: Secondary | ICD-10-CM | POA: Diagnosis not present

## 2023-01-03 DIAGNOSIS — R32 Unspecified urinary incontinence: Secondary | ICD-10-CM | POA: Diagnosis not present

## 2023-01-14 ENCOUNTER — Encounter: Payer: Self-pay | Admitting: Family

## 2023-01-21 DIAGNOSIS — G629 Polyneuropathy, unspecified: Secondary | ICD-10-CM | POA: Diagnosis not present

## 2023-01-21 DIAGNOSIS — Z1231 Encounter for screening mammogram for malignant neoplasm of breast: Secondary | ICD-10-CM | POA: Diagnosis not present

## 2023-01-21 DIAGNOSIS — Z1159 Encounter for screening for other viral diseases: Secondary | ICD-10-CM | POA: Diagnosis not present

## 2023-01-21 DIAGNOSIS — Z13220 Encounter for screening for lipoid disorders: Secondary | ICD-10-CM | POA: Diagnosis not present

## 2023-01-21 DIAGNOSIS — Z1211 Encounter for screening for malignant neoplasm of colon: Secondary | ICD-10-CM | POA: Diagnosis not present

## 2023-01-21 DIAGNOSIS — R739 Hyperglycemia, unspecified: Secondary | ICD-10-CM | POA: Diagnosis not present

## 2023-01-21 DIAGNOSIS — R1310 Dysphagia, unspecified: Secondary | ICD-10-CM | POA: Diagnosis not present

## 2023-01-21 DIAGNOSIS — I10 Essential (primary) hypertension: Secondary | ICD-10-CM | POA: Diagnosis not present

## 2023-01-21 DIAGNOSIS — Z114 Encounter for screening for human immunodeficiency virus [HIV]: Secondary | ICD-10-CM | POA: Diagnosis not present

## 2023-01-28 ENCOUNTER — Other Ambulatory Visit: Payer: Self-pay | Admitting: Family

## 2023-01-28 DIAGNOSIS — G4700 Insomnia, unspecified: Secondary | ICD-10-CM

## 2023-01-29 DIAGNOSIS — F419 Anxiety disorder, unspecified: Secondary | ICD-10-CM | POA: Diagnosis not present

## 2023-01-29 DIAGNOSIS — J069 Acute upper respiratory infection, unspecified: Secondary | ICD-10-CM | POA: Diagnosis not present

## 2023-02-05 DIAGNOSIS — R9431 Abnormal electrocardiogram [ECG] [EKG]: Secondary | ICD-10-CM | POA: Diagnosis not present

## 2023-02-13 ENCOUNTER — Encounter: Payer: Self-pay | Admitting: Family

## 2023-02-13 ENCOUNTER — Encounter: Payer: 59 | Admitting: Family

## 2023-02-13 NOTE — Progress Notes (Signed)
  This encounter was created in error - please disregard. No show 

## 2023-02-26 ENCOUNTER — Other Ambulatory Visit: Payer: Self-pay | Admitting: Family

## 2023-02-26 DIAGNOSIS — G4700 Insomnia, unspecified: Secondary | ICD-10-CM

## 2023-02-26 NOTE — Telephone Encounter (Signed)
Patient has request refill on the following medications and was given 30 day supply. Medication pend and sent to PCP Ngetich, Donalee Citrin, NP

## 2023-03-03 DIAGNOSIS — R509 Fever, unspecified: Secondary | ICD-10-CM | POA: Diagnosis not present

## 2023-03-18 DIAGNOSIS — F419 Anxiety disorder, unspecified: Secondary | ICD-10-CM | POA: Diagnosis not present

## 2023-03-24 DIAGNOSIS — G629 Polyneuropathy, unspecified: Secondary | ICD-10-CM | POA: Diagnosis not present

## 2023-03-24 DIAGNOSIS — F319 Bipolar disorder, unspecified: Secondary | ICD-10-CM | POA: Diagnosis not present

## 2023-03-24 DIAGNOSIS — I10 Essential (primary) hypertension: Secondary | ICD-10-CM | POA: Diagnosis not present

## 2023-03-24 DIAGNOSIS — E538 Deficiency of other specified B group vitamins: Secondary | ICD-10-CM | POA: Diagnosis not present

## 2023-03-24 DIAGNOSIS — R052 Subacute cough: Secondary | ICD-10-CM | POA: Diagnosis not present

## 2023-04-07 ENCOUNTER — Ambulatory Visit: Payer: 59 | Attending: Cardiology | Admitting: Cardiology

## 2023-04-21 ENCOUNTER — Other Ambulatory Visit: Payer: Self-pay | Admitting: Family

## 2023-06-04 ENCOUNTER — Telehealth: Payer: 59

## 2023-06-04 NOTE — Transitions of Care (Post Inpatient/ED Visit) (Signed)
06/04/2023  Name: Patricia [name] MRN: [id number] DOB: 1971-02-17  Today's TOC FU Call Status: Today's TOC FU Call Status:: Successful TOC FU Call Completed TOC FU Call Complete Date: 06/04/23 Patient's Name and Date of Birth confirmed.  Transition Care Management Follow-up Telephone Call Date of Discharge: 05/31/23 Discharge Facility: Other ([facility]) Name of Other ([facility]) Discharge Facility: [facility] Type of Discharge: Emergency Department How have you been since you were released from the hospital?: Same Any questions or concerns?: Yes Patient Questions/Concerns:: Patient will save questions for appointment.  Items Reviewed: Did you receive and understand the discharge instructions provided?: Yes Medications obtained,verified, and reconciled?: Yes (Medications Reviewed) Any new allergies since your discharge?: No Dietary orders reviewed?: No Do you have support at home?: Yes People in Home: sibling(s)  Medications Reviewed Today: Medications Reviewed Today     Reviewed by Dicky Doe, CMA (Certified Medical Assistant) on 06/04/23 at 1034  Med List Status: <None>   Medication Order Taking? Sig Documenting Provider Last Dose Status Informant  albuterol (VENTOLIN HFA) 108 (90 Base) MCG/ACT inhaler [id number] Yes Inhale 2 puffs into the lungs every 6 (six) hours as needed for wheezing or shortness of breath. Garnette Gunner, MD Taking Active Self  amLODipine (NORVASC) 5 MG tablet [id number] Yes Take 1 tablet (5 mg total) by mouth daily. Ngetich, Dinah C, NP Taking Active   atorvastatin (LIPITOR) 10 MG tablet [id number] Yes Take 1 tablet (10 mg total) by mouth daily. Ngetich, Dinah C, NP Taking Active   carvedilol (COREG) 6.25 MG tablet [id number] Yes Take 1 tablet (6.25 mg total) by mouth 2 (two) times daily. Ngetich, Donalee Citrin, NP Taking Active   cyclobenzaprine (FLEXERIL) 10 MG tablet [id number] Yes Take 1 tablet by mouth three times daily as needed for  muscle spasm Ngetich, Dinah C, NP Taking Active   diphenhydrAMINE (BENADRYL) 25 MG tablet [id number] Yes Take 25-50 mg by mouth every 6 (six) hours as needed for itching or allergies. [provider] Taking Active Self  Elastic Bandages & Supports (WRIST BRACE//LEFT LARGE) MISC [id number] Yes 1 each by Does not apply route at bedtime. Garnette Gunner, MD Taking Active Self  furosemide (LASIX) 20 MG tablet [id number] Yes Take 1 tablet (20 mg total) by mouth daily. Ngetich, Donalee Citrin, NP Taking Active   gabapentin (NEURONTIN) 100 MG capsule [id number] Yes Take 2 capsules (200 mg total) by mouth 2 (two) times daily. Medina-Vargas, Monina C, NP Taking Active   guaiFENesin-dextromethorphan (ROBITUSSIN DM) 100-10 MG/5ML syrup [id number] Yes Take 5 mLs by mouth every 4 (four) hours as needed for cough. Ngetich, Dinah C, NP Taking Active   hydrochlorothiazide (HYDRODIURIL) 25 MG tablet [id number] Yes Take 1 tablet by mouth once daily Garnette Gunner, MD Taking Active   hydrOXYzine (ATARAX) 25 MG tablet [id number] Yes TAKE 1 TABLET BY MOUTH EVERY 8 HOURS AS NEEDED FOR ITCHING Ngetich, Donalee Citrin, NP Taking Active   Incontinence Supplies MISC [id number] Yes 1 each by Does not apply route as needed (Incontinence). Garnette Gunner, MD Taking Active   potassium chloride [place] Dura M) 10 MEQ tablet [id number] Yes Take 1 tablet (10 mEq total) by mouth 2 (two) times daily. Elson Areas, PA-C Taking Active   traZODone (DESYREL) 100 MG tablet [id number] Yes TAKE 1 TABLET BY MOUTH AT BEDTIME Ngetich, Donalee Citrin, NP Taking Active   Med List Note Rolene Course 04/14/19 1712): Despite how prescribed, patient confirms that she only takes medications once at bedtime--pt is  transferring to [place] from [facility] and Equipment/Supplies: Were Home Health Services Ordered?: No Any new equipment or medical supplies ordered?: No  Functional Questionnaire: Do you need assistance with  bathing/showering or dressing?: No Do you need assistance with meal preparation?: No Do you need assistance with eating?: No Do you have difficulty maintaining continence: No Do you need assistance with getting out of bed/getting out of a chair/moving?: No Do you have difficulty managing or taking your medications?: No  Follow up appointments reviewed: PCP Follow-up appointment confirmed?: Yes Date of PCP follow-up appointment?: 06/06/23 Follow-up Provider: Richarda Blade, NP Specialist Hospital Follow-up appointment confirmed?: No Do you need transportation to your follow-up appointment?: No Do you understand care options if your condition(s) worsen?: Yes-patient verbalized understanding    SIGNATURE: Jentzen Minasyan.D/RMA

## 2023-06-06 ENCOUNTER — Encounter (INDEPENDENT_AMBULATORY_CARE_PROVIDER_SITE_OTHER): Payer: MEDICAID | Admitting: Family

## 2023-06-08 NOTE — Progress Notes (Signed)
  This encounter was created in error - please disregard. No show 

## 2023-06-23 ENCOUNTER — Other Ambulatory Visit: Payer: Self-pay | Admitting: Family

## 2023-09-30 ENCOUNTER — Other Ambulatory Visit: Payer: Self-pay | Admitting: Family

## 2024-04-16 ENCOUNTER — Emergency Department (HOSPITAL_COMMUNITY)
Admission: EM | Admit: 2024-04-16 | Discharge: 2024-04-16 | Disposition: A | Discharge status: Home / Self Care | Payer: MEDICAID | Attending: Emergency Medicine | Admitting: Emergency Medicine

## 2024-04-16 ENCOUNTER — Other Ambulatory Visit: Payer: Self-pay

## 2024-04-16 ENCOUNTER — Encounter (HOSPITAL_COMMUNITY): Payer: Self-pay | Admitting: Emergency Medicine

## 2024-04-16 DIAGNOSIS — Z91010 Allergy to peanuts: Secondary | ICD-10-CM | POA: Insufficient documentation

## 2024-04-16 DIAGNOSIS — H60501 Unspecified acute noninfective otitis externa, right ear: Secondary | ICD-10-CM | POA: Diagnosis not present

## 2024-04-16 DIAGNOSIS — H9201 Otalgia, right ear: Secondary | ICD-10-CM | POA: Diagnosis present

## 2024-04-16 MED ORDER — CIPROFLOXACIN-DEXAMETHASONE 0.3-0.1 % OT SUSP
4.0000 [drp] | Freq: Two times a day (BID) | OTIC | 0 refills | Status: AC
Start: 2024-04-16 — End: 2024-04-23

## 2024-04-16 MED ORDER — HYDROCODONE-ACETAMINOPHEN 5-325 MG PO TABS
1.0000 | ORAL_TABLET | Freq: Once | ORAL | Status: AC
  Administered 2024-04-16: 1 via ORAL
  Filled 2024-04-16: qty 1

## 2024-04-16 MED ORDER — NAPROXEN 500 MG PO TABS: 500.0000 mg | ORAL_TABLET | Freq: Two times a day (BID) | ORAL | 0 refills | Status: AC

## 2024-04-16 NOTE — ED Provider Notes (Signed)
 [place] EMERGENCY DEPARTMENT AT [facility] Provider Note   CSN: [id number] Arrival date & time: 04/16/24  9149     Patient presents with: Otalgia   Patricia [name] is a 53 y.o. female.   Patient is a 53 year old female who presents to the emergency department the chief complaint of right-sided ear pain which began in the middle of the night.  Patient notes that she has associated discharge from the ear as well.  Patient notes that she did have a tympanostomy tube placed in the right ear approximately 3 months ago.  Patient denies any associated fever or chills.  She has not recently gotten anything into her ear.   Otalgia      Prior to Admission medications   Medication Sig Start Date End Date Taking? Authorizing Provider  ciprofloxacin -dexamethasone  (CIPRODEX ) OTIC suspension Place 4 drops into the right ear 2 (two) times daily for 7 days. 04/16/24 04/23/24 Yes Jenie Parish, Lonni D, PA-C  naproxen  (NAPROSYN ) 500 MG tablet Take 1 tablet (500 mg total) by mouth 2 (two) times daily. 04/16/24  Yes Daralene Lonni BIRCH, PA-C  albuterol  (VENTOLIN  HFA) 108 (90 Base) MCG/ACT inhaler Inhale 2 puffs into the lungs every 6 (six) hours as needed for wheezing or shortness of breath. 11/19/21   Sebastian Beverley NOVAK, MD  amLODipine  (NORVASC ) 5 MG tablet Take 1 tablet (5 mg total) by mouth daily. 08/14/22   Ngetich, Dinah C, NP  atorvastatin  (LIPITOR) 10 MG tablet Take 1 tablet (10 mg total) by mouth daily. 09/16/22   Ngetich, Dinah C, NP  carvedilol  (COREG ) 6.25 MG tablet Take 1 tablet (6.25 mg total) by mouth 2 (two) times daily. 08/14/22   Ngetich, Dinah C, NP  cyclobenzaprine  (FLEXERIL ) 10 MG tablet Take 1 tablet by mouth three times daily as needed for muscle spasm 06/23/23   Ngetich, Dinah C, NP  diphenhydrAMINE  (BENADRYL ) 25 MG tablet Take 25-50 mg by mouth every 6 (six) hours as needed for itching or allergies.    [provider]  Elastic Bandages & Supports (WRIST  BRACE//LEFT LARGE) MISC 1 each by Does not apply route at bedtime. 11/19/21   Sebastian Beverley NOVAK, MD  furosemide  (LASIX ) 20 MG tablet Take 1 tablet (20 mg total) by mouth daily. 08/14/22   Ngetich, Dinah C, NP  gabapentin  (NEURONTIN ) 100 MG capsule Take 2 capsules (200 mg total) by mouth 2 (two) times daily. 11/22/22   Medina-Vargas, Monina C, NP  guaiFENesin -dextromethorphan  (ROBITUSSIN DM) 100-10 MG/5ML syrup Take 5 mLs by mouth every 4 (four) hours as needed for cough. 12/20/22   Ngetich, Dinah C, NP  hydrochlorothiazide  (HYDRODIURIL ) 25 MG tablet Take 1 tablet by mouth once daily 11/09/22   Sebastian Beverley NOVAK, MD  hydrOXYzine  (ATARAX ) 25 MG tablet TAKE 1 TABLET BY MOUTH EVERY 8 HOURS AS NEEDED FOR ITCHING 12/24/22   Ngetich, Dinah C, NP  potassium chloride  (KLOR-CON  M) 10 MEQ tablet Take 1 tablet (10 mEq total) by mouth 2 (two) times daily. 07/12/22   Sofia, Leslie K, PA-C  traZODone  (DESYREL ) 100 MG tablet TAKE 1 TABLET BY MOUTH AT BEDTIME 02/26/23   Ngetich, Dinah C, NP    Allergies: Iodinated contrast media, Peanut-containing drug products, and Oxycodone -acetaminophen     Review of Systems  HENT:  Positive for ear pain.   All other systems reviewed and are negative.   Updated Vital Signs BP (!) 146/98   Pulse 80   Temp 98.5 F (36.9 C) (Oral)   Resp 18  Ht 5' 4 (1.626 m)   Wt 119.3 kg   LMP 07/07/2014 (Approximate)   SpO2 99%   BMI 45.14 kg/m   Physical Exam Vitals and nursing note reviewed.  Constitutional:      General: She is not in acute distress.    Appearance: Normal appearance. She is not ill-appearing.  HENT:     Head: Normocephalic and atraumatic.     Left Ear: Tympanic membrane, ear canal and external ear normal.     Ears:     Comments: Right ear canal with erythema, edema and discharge, TM unremarkable, no mastoid tenderness bilaterally, bilateral external ears unremarkable    Nose: Nose normal.     Mouth/Throat:     Mouth: Mucous membranes are moist.  Eyes:      Extraocular Movements: Extraocular movements intact.     Conjunctiva/sclera: Conjunctivae normal.     Pupils: Pupils are equal, round, and reactive to light.  Cardiovascular:     Rate and Rhythm: Normal rate and regular rhythm.     Pulses: Normal pulses.     Heart sounds: Normal heart sounds.  Pulmonary:     Effort: Pulmonary effort is normal. No respiratory distress.  Musculoskeletal:        General: Normal range of motion.     Cervical back: Normal range of motion and neck supple. No rigidity or tenderness.  Skin:    General: Skin is warm and dry.  Neurological:     General: No focal deficit present.     Mental Status: She is alert and oriented to person, place, and time. Mental status is at baseline.  Psychiatric:        Mood and Affect: Mood normal.        Behavior: Behavior normal.        Thought Content: Thought content normal.        Judgment: Judgment normal.     (all labs ordered are listed, but only abnormal results are displayed) Labs Reviewed - No data to display  EKG: None  Radiology: No results found.   Procedures   Medications Ordered in the ED  HYDROcodone -acetaminophen  (NORCO/VICODIN) 5-325 MG per tablet 1 tablet (1 tablet Oral Given 04/16/24 0947)                                    Medical Decision Making Patient is doing well at this time and is stable for discharge home.  Discussed with patient that findings are consistent with otitis externa.  Will treat accordingly on outpatient basis.  She has no indication for malignant otitis externa, perichondritis, mastoiditis at this point.  Do not suspect any further workup or imaging is warranted.  Will place patient on Ciprodex  drops.  The need for close follow-up with primary care doctor to ensure resolution was discussed.  Strict return precautions were discussed as well for any new or worsening symptoms.  Patient voiced understanding and had no additional questions.  Risk Prescription drug  management.        Final diagnoses:  Acute otitis externa of right ear, unspecified type    ED Discharge Orders          Ordered    ciprofloxacin -dexamethasone  (CIPRODEX ) OTIC suspension  2 times daily        04/16/24 1001    naproxen  (NAPROSYN ) 500 MG tablet  2 times daily        04/16/24 1001  Daralene Lonni BIRCH, PA-C 04/16/24 1035    Suzette Pac, MD 04/21/24 1011

## 2024-04-16 NOTE — Discharge Instructions (Signed)
 Please follow-up closely with your primary care doctor and with ENT on an outpatient basis.  Return to emergency department immediately for any new or worsening symptoms.  Please use the eardrops as directed.

## 2024-04-16 NOTE — ED Triage Notes (Signed)
 Pt c/o of otalgia to right ear w/ drainage that started in the middle of the night . Pt states she had a tube placed in the right ear x3 months ago.

## 2024-08-08 ENCOUNTER — Emergency Department (HOSPITAL_COMMUNITY)

## 2024-08-08 ENCOUNTER — Encounter (HOSPITAL_COMMUNITY): Payer: Self-pay

## 2024-08-08 ENCOUNTER — Other Ambulatory Visit: Payer: Self-pay

## 2024-08-08 ENCOUNTER — Emergency Department (HOSPITAL_COMMUNITY)
Admission: EM | Admit: 2024-08-08 | Discharge: 2024-08-08 | Disposition: A | Discharge status: Home / Self Care | Attending: Emergency Medicine | Admitting: Emergency Medicine

## 2024-08-08 DIAGNOSIS — J209 Acute bronchitis, unspecified: Secondary | ICD-10-CM | POA: Insufficient documentation

## 2024-08-08 DIAGNOSIS — R600 Localized edema: Secondary | ICD-10-CM | POA: Insufficient documentation

## 2024-08-08 DIAGNOSIS — Z91010 Allergy to peanuts: Secondary | ICD-10-CM | POA: Insufficient documentation

## 2024-08-08 DIAGNOSIS — R059 Cough, unspecified: Secondary | ICD-10-CM | POA: Diagnosis present

## 2024-08-08 LAB — RESP PANEL BY RT-PCR (RSV, FLU A&B, COVID)  RVPGX2
Influenza A by PCR: NEGATIVE
Influenza B by PCR: NEGATIVE
Resp Syncytial Virus by PCR: NEGATIVE
SARS Coronavirus 2 by RT PCR: NEGATIVE

## 2024-08-08 MED ORDER — PREDNISONE 50 MG PO TABS
60.0000 mg | ORAL_TABLET | Freq: Once | ORAL | Status: AC
  Administered 2024-08-08: 60 mg via ORAL
  Filled 2024-08-08: qty 1

## 2024-08-08 MED ORDER — IPRATROPIUM-ALBUTEROL 0.5-2.5 (3) MG/3ML IN SOLN
3.0000 mL | Freq: Once | RESPIRATORY_TRACT | Status: AC
  Administered 2024-08-08: 3 mL via RESPIRATORY_TRACT
  Filled 2024-08-08: qty 3

## 2024-08-08 MED ORDER — HYDROCODONE-ACETAMINOPHEN 5-325 MG PO TABS
1.0000 | ORAL_TABLET | Freq: Once | ORAL | Status: AC
  Administered 2024-08-08: 1 via ORAL
  Filled 2024-08-08: qty 1

## 2024-08-08 MED ORDER — PREDNISONE 10 MG PO TABS: 40.0000 mg | ORAL_TABLET | Freq: Every day | ORAL | 0 refills | Status: AC

## 2024-08-08 MED ORDER — DOXYCYCLINE HYCLATE 100 MG PO CAPS: 100.0000 mg | ORAL_CAPSULE | Freq: Two times a day (BID) | ORAL | 0 refills | Status: AC

## 2024-08-08 MED ORDER — PROMETHAZINE-DM 6.25-15 MG/5ML PO SYRP: 5.0000 mL | ORAL_SOLUTION | Freq: Four times a day (QID) | ORAL | 0 refills | Status: AC | PRN

## 2024-08-08 MED ORDER — DOXYCYCLINE HYCLATE 100 MG PO TABS
100.0000 mg | ORAL_TABLET | Freq: Once | ORAL | Status: AC
  Administered 2024-08-08: 100 mg via ORAL
  Filled 2024-08-08: qty 1

## 2024-08-08 NOTE — Discharge Instructions (Signed)
 Please take all medications as directed.  Start taking the prednisone  tomorrow as you did receive today's dose today.  Please continue using your home nebulizer treatments approximately every 6 hours.  Follow-up closely with your primary care doctor on an outpatient basis early next week for reevaluation.  Return to the emergency department immediately for any new or worsening symptoms.  [facility] Primary Care Doctor List    Rollene Pesa, MD. Specialty: [facility] Medicine Contact information: [address]  [place] [address]  [phone]   Glendia Fielding, MD. Specialty: [facility] Medicine Contact information: [address]  [place] [address]  [phone]   Benita Outhouse, MD Specialty: Internal Medicine Contact information: [address] [place] [address]  [phone]   Darlyn Hurst, MD. Specialty: Internal Medicine Contact information: [address]  [place] [address]  [phone]    [facility] (Dr. Luke) Specialty: Family Medicine Contact information: [address]  [place] [address]  [phone]   Garnette Lolling, MD. Specialty: [facility] Medicine Contact information: [address]  [address]  [place] [address]  [phone]   Gaither Langton, MD. Specialty: Internal Medicine Contact information: [address]  [address]  [place] [address]  [phone]   [facility] Family Medicine: [address] [phone]  [place], Family medicine [address]  [phone]  [facility] [address] [place], [place] [phone]  [facility]: [address] [phone]    [facility]  [address] [place], [place] [address] [phone]  Services [facility] offers a variety of basic health services.  Services include but are not limited to: Blood pressure checks  Heart rate checks   Blood sugar checks  Urine analysis  Rapid strep tests  Pregnancy tests.  Health education and referrals  People needing more complex services will be directed to a physician online. Using these virtual visits, doctors can evaluate and prescribe medicine and treatments. There will be no medication on-site, though [facility] will help patients fill their prescriptions at little to no cost.   For More information please go to: [email or web address]  Allergy and Asthma:    [address] [place] [phone]  Urology:  [address]  [place] [phone]  [facility]  [address] [place], [place] [phone]  Orthopedics   [address] [place], [place] [phone]  Endocrinology  [address] [place], [place] [phone]  Podiatry: [facility] [phone]

## 2024-08-08 NOTE — ED Triage Notes (Signed)
 Pt states she has chronic bronchitis and has been coughing for a couple weeks and started getting a running nose over the past couple days. Pt has been taking over the counter medicine with no relief. Pt does have a chronic cough.

## 2024-08-08 NOTE — ED Provider Notes (Signed)
 " [place] EMERGENCY DEPARTMENT AT [facility] Provider Note   CSN: [id number] Arrival date & time: 08/08/24  1325     Patient presents with: [name]   Patricia [name] is a 54 y.o. female.   Patient is a 54 year old female who presents to the emergency department with a chief complaint of Patricia [name] nasal Patricia [name] which has been ongoing for approximate the past 2 weeks.  Patient notes that she has been experiencing subjective fevers at home over the past 2 days.  She has been exposed to influenza.  She does have a history of pneumonia as well.  She notes that yesterday she did have a bout of vomiting [name] has been experiencing some diarrhea over the past 2 days.  She denies any associated abdominal pain.   [name]      Prior to Admission medications  Medication Sig Start Date End Date Taking? Authorizing Provider  albuterol  (VENTOLIN  HFA) 108 (90 Base) MCG/ACT inhaler Inhale 2 puffs into the lungs every 6 (six) hours as needed for wheezing or shortness of [name]. 11/19/21   Sebastian Beverley NOVAK, MD  amLODipine  (NORVASC ) 5 MG tablet Take 1 tablet (5 mg total) by mouth daily. 08/14/22   Ngetich, Dinah C, NP  atorvastatin  (LIPITOR) 10 MG tablet Take 1 tablet (10 mg total) by mouth daily. 09/16/22   Ngetich, Dinah C, NP  carvedilol  (COREG ) 6.25 MG tablet Take 1 tablet (6.25 mg total) by mouth 2 (two) times daily. 08/14/22   Ngetich, Dinah C, NP  cyclobenzaprine  (FLEXERIL ) 10 MG tablet Take 1 tablet by mouth three times daily as needed for muscle spasm 06/23/23   Ngetich, Dinah C, NP  diphenhydrAMINE  (BENADRYL ) 25 MG tablet Take 25-50 mg by mouth every 6 (six) hours as needed for itching or allergies.    [provider]  Elastic Bandages & Supports (WRIST BRACE//LEFT LARGE) MISC 1 each by Does not apply route at bedtime. 11/19/21   Sebastian Beverley NOVAK, MD  furosemide  (LASIX ) 20 MG tablet Take 1 tablet (20 mg total)  by mouth daily. 08/14/22   Ngetich, Dinah C, NP  gabapentin  (NEURONTIN ) 100 MG capsule Take 2 capsules (200 mg total) by mouth 2 (two) times daily. 11/22/22   Medina-Vargas, Monina C, NP  guaiFENesin -dextromethorphan  (ROBITUSSIN DM) 100-10 MG/5ML syrup Take 5 mLs by mouth every 4 (four) hours as needed for [name]. 12/20/22   Ngetich, Dinah C, NP  hydrochlorothiazide  (HYDRODIURIL ) 25 MG tablet Take 1 tablet by mouth once daily 11/09/22   Sebastian Beverley NOVAK, MD  hydrOXYzine  (ATARAX ) 25 MG tablet TAKE 1 TABLET BY MOUTH EVERY 8 HOURS AS NEEDED FOR ITCHING 12/24/22   Ngetich, Dinah C, NP  naproxen  (NAPROSYN ) 500 MG tablet Take 1 tablet (500 mg total) by mouth 2 (two) times daily. 04/16/24   Daralene Lonni BIRCH, PA-C  potassium chloride  (KLOR-CON  M) 10 MEQ tablet Take 1 tablet (10 mEq total) by mouth 2 (two) times daily. 07/12/22   Sofia, Leslie K, PA-C  traZODone  (DESYREL ) 100 MG tablet TAKE 1 TABLET BY MOUTH AT BEDTIME 02/26/23   Ngetich, Dinah C, NP    Allergies: Iodinated contrast media, Peanut-containing drug products, [name] Oxycodone -acetaminophen     Review of Systems  Respiratory:  Positive for [name].   Gastrointestinal:  Positive for diarrhea [name] vomiting.  All other systems reviewed [name] are negative.   Updated Vital Signs BP (!) 137/98   Pulse 70   Temp 98.3 F (36.8 C) (Oral)  Resp (!) 22   Ht 5' 4 (1.626 m)   Wt 128.4 kg   LMP 07/11/2014   SpO2 100%   BMI 48.58 kg/m   Physical Exam Vitals [name] nursing note reviewed.  Constitutional:      General: She is not in acute distress.    Appearance: Normal appearance. She is not ill-appearing.  HENT:     Head: Normocephalic [name] atraumatic.     Nose: Patricia [name] [name] Patricia present.     Mouth/Throat:     Mouth: Mucous membranes are moist.     Pharynx: No oropharyngeal exudate or posterior oropharyngeal erythema.  Eyes:     Extraocular Movements: Extraocular movements intact.     Conjunctiva/sclera: Conjunctivae normal.     Pupils:  Pupils are equal, round, [name] reactive to light.  Cardiovascular:     Rate [name] Rhythm: Normal rate [name] regular rhythm.     Pulses: Normal pulses.     Heart sounds: Normal heart sounds. No murmur heard.    No gallop.  Pulmonary:     Effort: Pulmonary effort is normal. No respiratory distress.     [name] sounds: Normal [name] sounds. No stridor. No wheezing, rhonchi or rales.  Abdominal:     General: Abdomen is flat. Bowel sounds are normal. There is no distension.     Palpations: Abdomen is soft.     Tenderness: There is no abdominal tenderness. There is no guarding.  Musculoskeletal:        General: Normal range of motion.     Cervical back: Normal range of motion [name] neck supple. No rigidity or tenderness.     Right lower leg: Edema present.     Left lower leg: Edema present.  Skin:    General: Skin is warm [name] dry.     Findings: No rash.  Neurological:     General: No focal deficit present.     Mental Status: She is alert [name] oriented to person, place, [name] time. Mental status is at baseline.  Psychiatric:        Mood [name] Affect: Mood normal.        Behavior: Behavior normal.        Thought Content: Thought content normal.        Judgment: Judgment normal.     (all labs ordered are listed, but only abnormal results are displayed) Labs Reviewed  RESP PANEL BY RT-PCR (RSV, FLU A&B, COVID)  [id number]    EKG: None  Radiology: No results found.   Procedures   Medications Ordered in the ED  predniSONE  (DELTASONE ) tablet 60 mg (has no administration in time range)  ipratropium-albuterol  (DUONEB) 0.5-2.5 (3) MG/3ML nebulizer solution 3 mL (has no administration in time range)  ipratropium-albuterol  (DUONEB) 0.5-2.5 (3) MG/3ML nebulizer solution 3 mL (has no administration in time range)                                    Medical Decision Making Patient is feeling better at this time [name] is stable for discharge home.  Will treat her for acute exacerbation of her chronic  bronchitis at this point.  Patient has no indication for fluid overload and do not suspect acute CHF.  She has had no direct chest pain I do not suspect ACS or pulmonary embolus.  Do not suspect the blood work is warranted at this time.  Vital signs are otherwise stable with no indication  for sepsis.  She has no associated tachypnea on my last evaluation.  Discussed with patient about using her home nebulizer treatments approximately every 6 hours as well.  Do not suspect that admission is warranted at this time.  Discussed the need for close follow-up with her primary care doctor on an outpatient basis.  Strict, cautions were provided for any new or worsening symptoms.  Patient voiced understanding [name] had no additional questions.  Amount [name]/or Complexity of Data Reviewed Radiology: ordered.  Risk Prescription drug management.        Final diagnoses:  None    ED Discharge Orders     None          Daralene Lonni BIRCH, PA-C 08/08/24 1509  "
